# Patient Record
Sex: Female | Born: 1935
Health system: Southern US, Community
[De-identification: ages and names within clinical notes are randomized; demographics above are authoritative.]

## PROBLEM LIST (undated history)

## (undated) DIAGNOSIS — M1990 Unspecified osteoarthritis, unspecified site: Secondary | ICD-10-CM

## (undated) DIAGNOSIS — I441 Atrioventricular block, second degree: Secondary | ICD-10-CM

## (undated) DIAGNOSIS — H9190 Unspecified hearing loss, unspecified ear: Secondary | ICD-10-CM

## (undated) DIAGNOSIS — IMO0002 Reserved for concepts with insufficient information to code with codable children: Secondary | ICD-10-CM

## (undated) DIAGNOSIS — I82409 Acute embolism and thrombosis of unspecified deep veins of unspecified lower extremity: Secondary | ICD-10-CM

## (undated) DIAGNOSIS — H409 Unspecified glaucoma: Secondary | ICD-10-CM

## (undated) DIAGNOSIS — Z973 Presence of spectacles and contact lenses: Secondary | ICD-10-CM

## (undated) DIAGNOSIS — R609 Edema, unspecified: Secondary | ICD-10-CM

## (undated) DIAGNOSIS — K862 Cyst of pancreas: Secondary | ICD-10-CM

## (undated) DIAGNOSIS — R42 Dizziness and giddiness: Secondary | ICD-10-CM

## (undated) DIAGNOSIS — R112 Nausea with vomiting, unspecified: Secondary | ICD-10-CM

## (undated) DIAGNOSIS — I499 Cardiac arrhythmia, unspecified: Secondary | ICD-10-CM

## (undated) DIAGNOSIS — E785 Hyperlipidemia, unspecified: Secondary | ICD-10-CM

## (undated) DIAGNOSIS — F419 Anxiety disorder, unspecified: Secondary | ICD-10-CM

## (undated) DIAGNOSIS — Z9889 Other specified postprocedural states: Secondary | ICD-10-CM

## (undated) DIAGNOSIS — C801 Malignant (primary) neoplasm, unspecified: Secondary | ICD-10-CM

## (undated) DIAGNOSIS — IMO0001 Reserved for inherently not codable concepts without codable children: Secondary | ICD-10-CM

## (undated) DIAGNOSIS — I10 Essential (primary) hypertension: Secondary | ICD-10-CM

## (undated) DIAGNOSIS — H269 Unspecified cataract: Secondary | ICD-10-CM

## (undated) DIAGNOSIS — C50919 Malignant neoplasm of unspecified site of unspecified female breast: Secondary | ICD-10-CM

## (undated) DIAGNOSIS — Z972 Presence of dental prosthetic device (complete) (partial): Secondary | ICD-10-CM

## (undated) DIAGNOSIS — I2699 Other pulmonary embolism without acute cor pulmonale: Secondary | ICD-10-CM

## (undated) DIAGNOSIS — E039 Hypothyroidism, unspecified: Secondary | ICD-10-CM

## (undated) DIAGNOSIS — T7840XA Allergy, unspecified, initial encounter: Secondary | ICD-10-CM

## (undated) DIAGNOSIS — R0600 Dyspnea, unspecified: Secondary | ICD-10-CM

## (undated) HISTORY — DX: Atrioventricular block, second degree: I44.1

## (undated) HISTORY — PX: CATARACT EXTRACTION: SUR2

## (undated) HISTORY — PX: EYE SURGERY: SHX253

## (undated) HISTORY — PX: COLONOSCOPY: SHX174

## (undated) HISTORY — DX: Reserved for concepts with insufficient information to code with codable children: IMO0002

## (undated) HISTORY — PX: TONSILLECTOMY: SUR1361

## (undated) HISTORY — DX: Reserved for inherently not codable concepts without codable children: IMO0001

## (undated) HISTORY — DX: Malignant (primary) neoplasm, unspecified: C80.1

## (undated) HISTORY — DX: Unspecified osteoarthritis, unspecified site: M19.90

---
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
## 1964-05-22 HISTORY — PX: ABDOMINAL HYSTERECTOMY: SHX81

## 1990-05-22 HISTORY — PX: BREAST SURGERY: SHX581

## 1993-05-22 HISTORY — PX: JOINT REPLACEMENT: SHX530

## 1998-07-13 ENCOUNTER — Other Ambulatory Visit: Admission: RE | Admit: 1998-07-13 | Discharge: 1998-07-13 | Payer: Self-pay | Admitting: Internal Medicine

## 1999-05-19 ENCOUNTER — Ambulatory Visit (HOSPITAL_COMMUNITY): Admission: RE | Admit: 1999-05-19 | Discharge: 1999-05-19 | Payer: Self-pay | Admitting: Internal Medicine

## 1999-05-19 ENCOUNTER — Encounter: Payer: Self-pay | Admitting: Internal Medicine

## 1999-06-27 ENCOUNTER — Other Ambulatory Visit: Admission: RE | Admit: 1999-06-27 | Discharge: 1999-06-27 | Payer: Self-pay | Admitting: Internal Medicine

## 2000-07-03 ENCOUNTER — Other Ambulatory Visit: Admission: RE | Admit: 2000-07-03 | Discharge: 2000-07-03 | Payer: Self-pay | Admitting: Internal Medicine

## 2000-07-04 ENCOUNTER — Ambulatory Visit (HOSPITAL_COMMUNITY): Admission: RE | Admit: 2000-07-04 | Discharge: 2000-07-04 | Payer: Self-pay | Admitting: *Deleted

## 2000-07-04 ENCOUNTER — Encounter (INDEPENDENT_AMBULATORY_CARE_PROVIDER_SITE_OTHER): Payer: Self-pay | Admitting: Specialist

## 2001-07-19 ENCOUNTER — Other Ambulatory Visit: Admission: RE | Admit: 2001-07-19 | Discharge: 2001-07-19 | Payer: Self-pay | Admitting: Internal Medicine

## 2002-08-14 ENCOUNTER — Other Ambulatory Visit: Admission: RE | Admit: 2002-08-14 | Discharge: 2002-08-14 | Payer: Self-pay | Admitting: Oncology

## 2002-08-21 ENCOUNTER — Encounter: Payer: Self-pay | Admitting: Internal Medicine

## 2002-08-21 ENCOUNTER — Encounter: Admission: RE | Admit: 2002-08-21 | Discharge: 2002-08-21 | Payer: Self-pay | Admitting: Internal Medicine

## 2004-01-04 ENCOUNTER — Other Ambulatory Visit: Admission: RE | Admit: 2004-01-04 | Discharge: 2004-01-04 | Payer: Self-pay | Admitting: Internal Medicine

## 2006-01-08 ENCOUNTER — Encounter (INDEPENDENT_AMBULATORY_CARE_PROVIDER_SITE_OTHER): Payer: Self-pay | Admitting: Specialist

## 2006-01-08 ENCOUNTER — Ambulatory Visit (HOSPITAL_COMMUNITY): Admission: RE | Admit: 2006-01-08 | Discharge: 2006-01-08 | Payer: Self-pay | Admitting: *Deleted

## 2007-10-18 ENCOUNTER — Other Ambulatory Visit: Admission: RE | Admit: 2007-10-18 | Discharge: 2007-10-18 | Payer: Self-pay | Admitting: Internal Medicine

## 2007-12-05 ENCOUNTER — Encounter: Admission: RE | Admit: 2007-12-05 | Discharge: 2007-12-05 | Payer: Self-pay | Admitting: Internal Medicine

## 2008-01-04 ENCOUNTER — Observation Stay (HOSPITAL_COMMUNITY): Admission: EM | Admit: 2008-01-04 | Discharge: 2008-01-05 | Payer: Self-pay | Admitting: *Deleted

## 2008-01-13 ENCOUNTER — Encounter (INDEPENDENT_AMBULATORY_CARE_PROVIDER_SITE_OTHER): Payer: Self-pay | Admitting: *Deleted

## 2008-01-13 ENCOUNTER — Ambulatory Visit (HOSPITAL_COMMUNITY): Admission: RE | Admit: 2008-01-13 | Discharge: 2008-01-13 | Payer: Self-pay | Admitting: *Deleted

## 2008-01-15 ENCOUNTER — Encounter: Admission: RE | Admit: 2008-01-15 | Discharge: 2008-01-15 | Payer: Self-pay | Admitting: Internal Medicine

## 2008-08-31 ENCOUNTER — Encounter: Admission: RE | Admit: 2008-08-31 | Discharge: 2008-08-31 | Payer: Self-pay | Admitting: Internal Medicine

## 2008-09-16 ENCOUNTER — Encounter: Payer: Self-pay | Admitting: Internal Medicine

## 2009-03-18 ENCOUNTER — Encounter: Admission: RE | Admit: 2009-03-18 | Discharge: 2009-03-18 | Payer: Self-pay | Admitting: Internal Medicine

## 2009-10-12 ENCOUNTER — Encounter: Admission: RE | Admit: 2009-10-12 | Discharge: 2009-10-12 | Payer: Self-pay | Admitting: Geriatric Medicine

## 2010-10-04 NOTE — Op Note (Signed)
Sarah [name], SILLAS NO.:  [id number]   MEDICAL RECORD NO.:  [id number]          PATIENT TYPE:  AMB   LOCATION:  ENDO                         FACILITY:  [facility]   PHYSICIAN:  Georgiana Spinner, M.D.    DATE OF BIRTH:  [birth year]   DATE OF PROCEDURE:  01/13/2008  DATE OF DISCHARGE:                               OPERATIVE REPORT   PROCEDURE:  Colonoscopy.   INDICATIONS:  Rectal bleeding.   ANESTHESIA:  Fentanyl 75 mcg, Versed 7.5 mg.   PROCEDURE:  With the patient mildly sedated in the left lateral  decubitus position, the Pentax videoscopic colonoscope was inserted the  rectum, passed under direct vision into the sigmoid colon and I could go  no further with the scope so it was withdrawn, taking circumferential  views of remaining colonic mucosa, and subsequently a Pentax videoscopic  pediatric colonoscope was inserted into the rectum and passed under  direct vision through a tight tortuous sigmoid colon to reach the cecum  identified by ileocecal valve and appendiceal orifice, both which were  photographed.  In the appendiceal orifice there was what I thought was  probably a retroverted appendix or diverticulum inverted of some sort.  This was photographed and 1 biopsy was taken from the mucosa.  From this  point, the colonoscope was slowly withdrawn, taking circumferential  views of colonic mucosa, stopping only in the rectum which appeared  normal on direct and showed normal on retroflexed view and pulled  through the anal canal.  The endoscope was straightened and withdrawn  through the anal canal which appeared normal.  The patient's vital signs  and pulse oximeter remained stable.  The patient tolerated procedure  well without apparent complication.   FINDINGS:  Diverticulum or inverted appendix in the cecum, otherwise an  entirely normal colonoscopic examination to the cecum.   PLAN:  Have the patient follow up with me for results of biopsy and as   needed.           ______________________________  Georgiana Spinner, M.D.     GMO/MEDQ  D:  01/13/2008  T:  01/13/2008  Job:  [id number]

## 2010-10-04 NOTE — H&P (Signed)
NAMEAVALON, COPPINGER              ACCOUNT NO.:  [id number]   MEDICAL RECORD NO.:  [id number]          PATIENT TYPE:  EMS   LOCATION:  ED                           FACILITY:  [facility]   PHYSICIAN:  Lonia Blood, M.D.      DATE OF BIRTH:  1933/04/03   DATE OF ADMISSION:  01/04/2008  DATE OF DISCHARGE:                              HISTORY & PHYSICAL   PRIMARY CARE PHYSICIAN:  Dr. Pearson Grippe.   PRESENTING COMPLAINT:  Rectal bleed.   HISTORY OF PRESENT ILLNESS:  The patient is a 77 year old female with  history of hypertension and  hypothyroidism who had been doing well  until this morning, around 12:30 a.m., when she suddenly had bright red  blood per rectum while having a bowel movement.  The patient reported  there were clots and it filled the toilet bowl.  She had never had any  bleeding prior to that.  She had, however, noticed some black stools  when she started taking antibiotics about 3 weeks ago.  No dizziness.  No fever.  No nausea, vomiting, diarrhea.  No abdominal pain.  No recent  history of constipation.  No history of hemorrhoids.  The patient was  alarmed and worried and came to the emergency room.  So far there has  been no repeat of her bleed, and she is still stable.  No orthostasis.   PAST MEDICAL HISTORY:  Hypertension, hypothyroidism.   ALLERGIES:  SHE IS ALLERGIC TO CODEINE.   MEDICATIONS:  Synthroid 50 mcg daily, lisinopril 10 mg daily, and Caduet  once daily.   SOCIAL HISTORY:  The patient lives here in [place].  Denies any  tobacco, alcohol or IV drug use.   FAMILY HISTORY:  Significant for hypertension.   REVIEW OF SYSTEMS:  A 12-point review of systems is negative except per  HPI.   PHYSICAL EXAMINATION:  VITAL SIGNS:  Temperature 97.8, blood pressure  160/104 initially, currently 150/92, pulse 73, respiratory rate 16, sats  97% on room air.  GENERAL:  The patient is awake, alert, oriented, in no acute distress.  HEENT:  PERRL.  EOMI.  No pallor, no  jaundice.  NECK:  Supple.  No JVD.  No significant lymphadenopathy.  RESPIRATORY:  She has good air entry bilaterally.  No wheezes, no rales.  CARDIOVASCULAR:  The patient has S1, S2.  No murmur.  ABDOMEN:  Soft, flat, nontender with positive bowel sounds.  EXTREMITIES:  No edema, cyanosis or clubbing.   Labs showed a white count 5.1, hemoglobin 13.7, platelet count 189 with  normal differential.  Urinalysis showed moderate leukocyte esterase and  small hemoglobin, WBCs only 3 to 6 with rare bacteria.  PT/INR 14 and  1.1.  Sodium 141, potassium 3.8, chloride 108, CO2 24, glucose 94, BUN  15, creatinine 0.6, calcium 9.8, total protein 6.9,  and the rest of the  LFTs are normal.   ASSESSMENT:  This is a 77 year old female with one episode of rectal  bleed.  The patient has so far a stable hemoglobin although it has not  equilibrated yet probably.  Also her BUN seems  to be normal, indicating  that this may be very low in the rectum as a source of her bleed.   PLAN:  1. Rectal bleed.  We will admit the patient for observation on a      monitored bed.  We will do serial CBCs to follow her hemoglobin and      see if it drops.  We will also monitor her vitals here.  We will      have 2 wide-bore IVs inserted, type and crossmatch her for 2 units      and keep in case we need it, IV fluids, IV Protonix, and we will      consult GI.  If the patient's bleeding continues, she will need to      have a colonoscopy.  Per patient, she had a colonoscopy 2 years ago      that was normal, but we do not have the report of that.  2. Hypertension.  I will hold her antihypertensives for now until we      are sure the bleeding has stopped.  3. Hypothyroidism.  I will check a TSH and also hold the Synthroid.  I      will put her on clear liquids now, and we will probably resume her      Synthroid within the next 24 hours.   Otherwise, further treatment will depend on how the patient does in the  hospital,  especially regarding her rectal bleeding.      Lonia Blood, M.D.  Electronically Signed     LG/MEDQ  D:  01/04/2008  T:  01/04/2008  Job:  [id number]

## 2010-10-07 NOTE — Op Note (Signed)
Sarah [name], Sarah [name] NO.:  [id number]   MEDICAL RECORD NO.:  [id number]          PATIENT TYPE:  AMB   LOCATION:  ENDO                         FACILITY:  [facility]   PHYSICIAN:  Georgiana Spinner, M.D.    DATE OF BIRTH:  1932-07-04   DATE OF PROCEDURE:  DATE OF DISCHARGE:                                 OPERATIVE REPORT   PROCEDURE:  Colonoscopy.   INDICATIONS:  Colon polyps.   ANESTHESIA:  Fentanyl 100 mcg, Versed 10 mg.   DESCRIPTION OF PROCEDURE:  With the patient mildly sedated in the left  lateral decubitus position, the Olympus videoscopic colonoscope PCF-160 was  inserted in the rectum, passed under direct vision to the cecum, identified  by the ileocecal valve and base of cecum. In the base of the cecum was a  polypoid area which may have been I felt submucosal lipoma less likely  inverted appendix but I decided to biopsy the mucosa to rule out adenomatous  change. From this point, the colonoscope was slowly withdrawn taking  circumferential views of the colonic mucosa stopping in the rectum which  appeared normal on direct and retroflexed view. The endoscope was  straightened and withdrawn.  The patient's vital signs and pulse oximeter  remained stable.  The patient tolerated the procedure well without apparent  complications.   FINDINGS:  Question of polyp in the cecum more likely submucosal lipoma.   PLAN:  Await biopsy report.  The patient will call me for results and follow-  up with me as an outpatient.           ______________________________  Georgiana Spinner, M.D.     GMO/MEDQ  D:  01/08/2006  T:  01/08/2006  Job:  [id number]

## 2010-10-07 NOTE — Procedures (Signed)
[facility]  Patient:    Sarah [name], Sarah [name]                       MRN: [id number] Proc. Date: 07/04/00 Adm. Date:  [id number] Attending:  Sabino Gasser                           Procedure Report  PROCEDURE:  Colonoscopy.  GASTROENTEROLOGIST:  Sabino Gasser, M.D.  ANESTHESIA:  Demerol 80 mg, Versed 9 mg.  PROCEDURE IN DETAIL:  With the patient mildly sedated and in the left lateral decubitus position, the Olympus video pediatric colonoscope was inserted in the rectum and passed through a very tortuous sigmoid colon in which we had difficulty maintaining a lumen but were able to get past this.  Subsequently, we reached the cecum.  The cecum was identified by ileocecal valve and appendiceal area.  There was a questionable polyp here, although it very well may have been inverted appendix.  This was photographed, and one mucosal biopsy was taken only.   Once accomplished, the colonoscope was slowly withdrawn, taking circumferential views of the entire colonic mucosa, stopping only then at the sigmoid colon just below the tortuous area where a small polyp was seen at approximately 20 cm from the anal verge.  It was photographed and was removed using hot biopsy forceps technique.  The colonoscope was withdrawn to the rectum which appeared normal on direct view and showed small polyp on retroflexed view that we eradicated using hot biopsy forceps technique with the setting 3 and 3 blended current.  The endoscope was then straightened and withdrawn.  The patients vital signs and pulse oximetry remained stable.  The patient tolerated the procedure well with no apparent complications.  FINDINGS:  Polyp at 20 cm in the rectum.  Question of inverted appendix versus polyp.  Await biopsy report.  PLAN:  The patient will call me for results and follow up with me as an outpatient. DD:  07/04/00 TD:  07/04/00 Job: [id number]

## 2011-05-23 HISTORY — PX: JOINT REPLACEMENT: SHX530

## 2011-06-09 DIAGNOSIS — Z79899 Other long term (current) drug therapy: Secondary | ICD-10-CM | POA: Diagnosis not present

## 2011-06-09 DIAGNOSIS — I10 Essential (primary) hypertension: Secondary | ICD-10-CM | POA: Diagnosis not present

## 2011-06-09 DIAGNOSIS — Z0000 Encounter for general adult medical examination without abnormal findings: Secondary | ICD-10-CM | POA: Diagnosis not present

## 2011-06-09 DIAGNOSIS — E039 Hypothyroidism, unspecified: Secondary | ICD-10-CM | POA: Diagnosis not present

## 2011-06-09 DIAGNOSIS — E7800 Pure hypercholesterolemia, unspecified: Secondary | ICD-10-CM | POA: Diagnosis not present

## 2011-06-14 DIAGNOSIS — I10 Essential (primary) hypertension: Secondary | ICD-10-CM | POA: Diagnosis not present

## 2011-06-14 DIAGNOSIS — Z23 Encounter for immunization: Secondary | ICD-10-CM | POA: Diagnosis not present

## 2011-06-14 DIAGNOSIS — E039 Hypothyroidism, unspecified: Secondary | ICD-10-CM | POA: Diagnosis not present

## 2011-06-14 DIAGNOSIS — E7800 Pure hypercholesterolemia, unspecified: Secondary | ICD-10-CM | POA: Diagnosis not present

## 2011-06-14 DIAGNOSIS — Z79899 Other long term (current) drug therapy: Secondary | ICD-10-CM | POA: Diagnosis not present

## 2011-06-19 DIAGNOSIS — H8110 Benign paroxysmal vertigo, unspecified ear: Secondary | ICD-10-CM | POA: Diagnosis not present

## 2011-06-19 DIAGNOSIS — R42 Dizziness and giddiness: Secondary | ICD-10-CM | POA: Diagnosis not present

## 2011-07-11 ENCOUNTER — Other Ambulatory Visit: Payer: Self-pay | Admitting: Orthopedic Surgery

## 2011-07-11 ENCOUNTER — Ambulatory Visit
Admission: RE | Admit: 2011-07-11 | Discharge: 2011-07-11 | Disposition: A | Discharge status: Home / Self Care | Payer: Medicare Other | Source: Ambulatory Visit | Attending: Orthopedic Surgery | Admitting: Orthopedic Surgery

## 2011-07-11 DIAGNOSIS — M79669 Pain in unspecified lower leg: Secondary | ICD-10-CM

## 2011-07-11 DIAGNOSIS — M79609 Pain in unspecified limb: Secondary | ICD-10-CM | POA: Diagnosis not present

## 2011-07-11 DIAGNOSIS — R928 Other abnormal and inconclusive findings on diagnostic imaging of breast: Secondary | ICD-10-CM | POA: Diagnosis not present

## 2011-07-11 DIAGNOSIS — M1710 Unilateral primary osteoarthritis, unspecified knee: Secondary | ICD-10-CM | POA: Diagnosis not present

## 2011-07-11 DIAGNOSIS — Z09 Encounter for follow-up examination after completed treatment for conditions other than malignant neoplasm: Secondary | ICD-10-CM | POA: Diagnosis not present

## 2011-07-26 DIAGNOSIS — H8110 Benign paroxysmal vertigo, unspecified ear: Secondary | ICD-10-CM | POA: Diagnosis not present

## 2011-07-31 ENCOUNTER — Ambulatory Visit: Payer: Medicare Other | Attending: Geriatric Medicine | Admitting: Physical Therapy

## 2011-07-31 DIAGNOSIS — IMO0001 Reserved for inherently not codable concepts without codable children: Secondary | ICD-10-CM | POA: Diagnosis not present

## 2011-07-31 DIAGNOSIS — H8110 Benign paroxysmal vertigo, unspecified ear: Secondary | ICD-10-CM | POA: Diagnosis not present

## 2011-07-31 DIAGNOSIS — R269 Unspecified abnormalities of gait and mobility: Secondary | ICD-10-CM | POA: Insufficient documentation

## 2011-08-07 ENCOUNTER — Encounter: Payer: Medicare Other | Admitting: Physical Therapy

## 2011-08-07 ENCOUNTER — Encounter (HOSPITAL_COMMUNITY): Payer: Self-pay | Admitting: Pharmacy Technician

## 2011-08-08 DIAGNOSIS — M1710 Unilateral primary osteoarthritis, unspecified knee: Secondary | ICD-10-CM | POA: Diagnosis not present

## 2011-08-10 ENCOUNTER — Other Ambulatory Visit: Payer: Self-pay

## 2011-08-10 ENCOUNTER — Encounter (HOSPITAL_COMMUNITY): Payer: Self-pay

## 2011-08-10 ENCOUNTER — Encounter (HOSPITAL_COMMUNITY)
Admission: RE | Admit: 2011-08-10 | Discharge: 2011-08-10 | Disposition: A | Discharge status: Home / Self Care | Payer: Medicare Other | Source: Ambulatory Visit | Attending: Orthopedic Surgery | Admitting: Orthopedic Surgery

## 2011-08-10 ENCOUNTER — Encounter (HOSPITAL_COMMUNITY)
Admission: RE | Admit: 2011-08-10 | Discharge: 2011-08-10 | Disposition: A | Discharge status: Home / Self Care | Payer: Medicare Other | Source: Ambulatory Visit | Attending: Surgery | Admitting: Surgery

## 2011-08-10 DIAGNOSIS — I10 Essential (primary) hypertension: Secondary | ICD-10-CM | POA: Diagnosis not present

## 2011-08-10 DIAGNOSIS — M47814 Spondylosis without myelopathy or radiculopathy, thoracic region: Secondary | ICD-10-CM | POA: Diagnosis not present

## 2011-08-10 DIAGNOSIS — E785 Hyperlipidemia, unspecified: Secondary | ICD-10-CM | POA: Diagnosis not present

## 2011-08-10 DIAGNOSIS — M1710 Unilateral primary osteoarthritis, unspecified knee: Secondary | ICD-10-CM | POA: Diagnosis not present

## 2011-08-10 DIAGNOSIS — E039 Hypothyroidism, unspecified: Secondary | ICD-10-CM | POA: Diagnosis not present

## 2011-08-10 DIAGNOSIS — Z01811 Encounter for preprocedural respiratory examination: Secondary | ICD-10-CM | POA: Diagnosis not present

## 2011-08-10 DIAGNOSIS — J984 Other disorders of lung: Secondary | ICD-10-CM | POA: Diagnosis not present

## 2011-08-10 HISTORY — DX: Hypothyroidism, unspecified: E03.9

## 2011-08-10 HISTORY — DX: Essential (primary) hypertension: I10

## 2011-08-10 HISTORY — DX: Nausea with vomiting, unspecified: R11.2

## 2011-08-10 HISTORY — DX: Hyperlipidemia, unspecified: E78.5

## 2011-08-10 HISTORY — DX: Other specified postprocedural states: Z98.890

## 2011-08-10 LAB — URINALYSIS, ROUTINE W REFLEX MICROSCOPIC
Bilirubin Urine: NEGATIVE
Hgb urine dipstick: NEGATIVE
Specific Gravity, Urine: 1.016 (ref 1.005–1.030)
pH: 7 (ref 5.0–8.0)

## 2011-08-10 LAB — CBC
HCT: 45.7 % (ref 36.0–46.0)
Hemoglobin: 15.6 g/dL — ABNORMAL HIGH (ref 12.0–15.0)
MCH: 30.4 pg (ref 26.0–34.0)
MCHC: 34.1 g/dL (ref 30.0–36.0)
MCV: 88.9 fL (ref 78.0–100.0)

## 2011-08-10 LAB — COMPREHENSIVE METABOLIC PANEL
Alkaline Phosphatase: 80 U/L (ref 39–117)
BUN: 15 mg/dL (ref 6–23)
GFR calc Af Amer: >90 mL/min (ref >90)
Glucose, Bld: 80 mg/dL (ref 70–99)
Potassium: 4 mEq/L (ref 3.5–5.1)
Total Protein: 7.3 g/dL (ref 6.0–8.3)

## 2011-08-10 LAB — PROTIME-INR: Prothrombin Time: 13.3 seconds (ref 11.6–15.2)

## 2011-08-10 LAB — APTT: aPTT: 30 seconds (ref 24–37)

## 2011-08-10 LAB — SURGICAL PCR SCREEN
MRSA, PCR: NEGATIVE
Staphylococcus aureus: POSITIVE — AB

## 2011-08-10 LAB — TYPE AND SCREEN: Antibody Screen: NEGATIVE

## 2011-08-10 NOTE — Pre-Procedure Instructions (Addendum)
20 Sarah [name]  08/10/2011   Your procedure is scheduled on:  March 27 [facility])  Report to [facility] at 1020 AM.  Call this number if you have problems the morning of surgery: [phone]   Remember:   Do not eat food:After Midnight.  May have clear liquids: up to 4 Hours before arrival.  Clear liquids include soda, tea, black coffee, apple or grape juice, broth.  Take these medicines the morning of surgery with A SIP OF WATER: NORVASC,SYNTHROID   Do not wear jewelry, make-up or nail polish.  Do not wear lotions, powders, or perfumes. You may wear deodorant.  Do not shave 48 hours prior to surgery.  Do not bring valuables to the hospital.  Contacts, dentures or bridgework may not be worn into surgery.  Leave suitcase in the car. After surgery it may be brought to your room.  For patients admitted to the hospital, checkout time is 11:00 AM the day of discharge.   Patients discharged the day of surgery will not be allowed to drive home.  Name and phone number of your driver: NA  Special Instructions: Incentive Spirometry - Practice and bring it with you on the day of surgery. and CHG Shower Use Special Wash: 1/2 bottle night before surgery and 1/2 bottle morning of surgery.   Please read over the following fact sheets that you were given: Pain Booklet, Blood Transfusion Information, Total Joint Packet, MRSA Information and Surgical Site Infection Prevention

## 2011-08-11 DIAGNOSIS — H3530 Unspecified macular degeneration: Secondary | ICD-10-CM | POA: Diagnosis not present

## 2011-08-11 DIAGNOSIS — H04129 Dry eye syndrome of unspecified lacrimal gland: Secondary | ICD-10-CM | POA: Diagnosis not present

## 2011-08-11 DIAGNOSIS — H40019 Open angle with borderline findings, low risk, unspecified eye: Secondary | ICD-10-CM | POA: Diagnosis not present

## 2011-08-11 DIAGNOSIS — H18519 Endothelial corneal dystrophy, unspecified eye: Secondary | ICD-10-CM | POA: Diagnosis not present

## 2011-08-11 LAB — URINE CULTURE
Colony Count: 15000
Culture  Setup Time: 201303211206

## 2011-08-14 NOTE — H&P (Signed)
  [facility] [address]   [address] [place], [place] [address] [phone] A Division of [facility]  Loreta Ave, M.D.     Robert A. Thurston Hole, M.D.     Lunette Stands, M.D. Eulas Post, M.D.    Buford Dresser, M.D. Estell Harpin, M.D. Ralene Cork, D.O.          Genene Churn. Barry Dienes, PA-C            Kirstin A. Shepperson, PA-C Janace Litten, OPA-C   RE: Sarah, [name]                                [id number]      DOB: 09/08/1932 PROGRESS NOTE: 08-08-11 Chief complaint: Right knee pain.  History of present illness: 78 year-old white female with a history of end stage DJD, right knee, and chronic pain.  States that symptoms are unchanged from previous visit.  She is wanting to proceed with total knee replacement as scheduled.  We received pre-op medical clearance from Dr. Merlene Laughter.  Patient states that she was recently seen by Dr. Pete Glatter for dizziness and diagnosed with vertigo.  She did see a physical therapist for this.  She admits to having some balance issues over the last few years, but has not seen a neurologist.  She has been taking Ambien for about three years and we discussed this possibly contributing to her symptoms.  No complaints of chest pain or shortness of breath. Current medications: Naproxen, Amlodipine, Simvastatin, Ambien and Synthroid. Allergies: Codeine. Past medical/surgical history: Hypercholesterolemia, hypertension, hypothyroidism and sleep disorder. Review of systems: Patient states that she was treated about a month ago for a UTI.  Denies fevers, chills, cardiac, pulmonary, GI or GU issues.   Family history: Positive for hypertension and heart disease.  Social history: Does not smoke, admits occasional wine consumption.  Patient is a widow and currently retired.       EXAMINATION: Height: 5?5.  Weight: 129 pounds.  Respirations: 24.  Blood pressure: 139/84.  Pulse: 76.   Temperature: 98.0.  Pleasant, elderly white female, alert and oriented x 3 and in no acute distress.  Head is normocephalic, a traumatic.  PERRLA, EOMI.  No increase in respiratory effort.  Lungs: CTA bilaterally.  No wheezes noted.  Heart: RRR.  S1 and S2.  No murmurs noted.  Abdomen: Round and non-distended.  NBS x 4.  Soft and non-tender.  Right knee: Range of motion about 5-120 degrees.  Positive crepitus.  Positive effusion.  Joint line tender.  Ligaments stable.  Bilateral calves non-tender.  Neurovascularly intact.  Skin warm and dry.   IMPRESSION: End stage DJD, right knee, and chronic pain.  Failed conservative treatment.    PLAN:  We will proceed with total knee replacement as scheduled.  Advised her to call Dr. Laverle Hobby office to discuss the possibility of Ambien contributing to her ongoing balance and vertigo issues.  Dr. Eulah Pont discussed gradual taper off this medication at some point.  Follow up in the office two weeks post-op.  All questions answered.    Loreta Ave, M.D.   Electronically verified by Loreta Ave, M.D. DFM(JMO):jjh Cc: Dr. Merlene Laughter, fax: [phone] D 08-08-11 T 08-09-11

## 2011-08-15 MED ORDER — CEFAZOLIN SODIUM-DEXTROSE 2-3 GM-% IV SOLR
2.0000 g | INTRAVENOUS | Status: AC
  Administered 2011-08-16: 2 g via INTRAVENOUS
  Filled 2011-08-15: qty 50

## 2011-08-16 ENCOUNTER — Encounter (HOSPITAL_COMMUNITY): Payer: Self-pay | Admitting: Anesthesiology

## 2011-08-16 ENCOUNTER — Encounter (HOSPITAL_COMMUNITY)
Admission: RE | Disposition: A | Discharge status: Home Health | Payer: Self-pay | Source: Ambulatory Visit | Attending: Orthopedic Surgery

## 2011-08-16 ENCOUNTER — Inpatient Hospital Stay (HOSPITAL_COMMUNITY)
Admission: RE | Admit: 2011-08-16 | Discharge: 2011-08-19 | DRG: 470 | Disposition: A | Discharge status: Home Health | Payer: Medicare Other | Source: Ambulatory Visit | Attending: Orthopedic Surgery | Admitting: Orthopedic Surgery

## 2011-08-16 ENCOUNTER — Ambulatory Visit (HOSPITAL_COMMUNITY): Payer: Medicare Other

## 2011-08-16 ENCOUNTER — Ambulatory Visit (HOSPITAL_COMMUNITY): Payer: Medicare Other | Admitting: Anesthesiology

## 2011-08-16 DIAGNOSIS — Z87440 Personal history of urinary (tract) infections: Secondary | ICD-10-CM | POA: Diagnosis not present

## 2011-08-16 DIAGNOSIS — Z886 Allergy status to analgesic agent status: Secondary | ICD-10-CM

## 2011-08-16 DIAGNOSIS — IMO0002 Reserved for concepts with insufficient information to code with codable children: Secondary | ICD-10-CM | POA: Diagnosis not present

## 2011-08-16 DIAGNOSIS — I10 Essential (primary) hypertension: Secondary | ICD-10-CM | POA: Diagnosis not present

## 2011-08-16 DIAGNOSIS — Z01812 Encounter for preprocedural laboratory examination: Secondary | ICD-10-CM | POA: Diagnosis not present

## 2011-08-16 DIAGNOSIS — Z7901 Long term (current) use of anticoagulants: Secondary | ICD-10-CM

## 2011-08-16 DIAGNOSIS — Z471 Aftercare following joint replacement surgery: Secondary | ICD-10-CM

## 2011-08-16 DIAGNOSIS — Z888 Allergy status to other drugs, medicaments and biological substances status: Secondary | ICD-10-CM | POA: Diagnosis not present

## 2011-08-16 DIAGNOSIS — E785 Hyperlipidemia, unspecified: Secondary | ICD-10-CM | POA: Diagnosis not present

## 2011-08-16 DIAGNOSIS — E039 Hypothyroidism, unspecified: Secondary | ICD-10-CM | POA: Diagnosis not present

## 2011-08-16 DIAGNOSIS — M1710 Unilateral primary osteoarthritis, unspecified knee: Secondary | ICD-10-CM | POA: Diagnosis not present

## 2011-08-16 DIAGNOSIS — M25569 Pain in unspecified knee: Secondary | ICD-10-CM | POA: Diagnosis not present

## 2011-08-16 DIAGNOSIS — M1711 Unilateral primary osteoarthritis, right knee: Secondary | ICD-10-CM | POA: Diagnosis present

## 2011-08-16 DIAGNOSIS — Z9889 Other specified postprocedural states: Secondary | ICD-10-CM | POA: Insufficient documentation

## 2011-08-16 DIAGNOSIS — Z96659 Presence of unspecified artificial knee joint: Secondary | ICD-10-CM | POA: Diagnosis not present

## 2011-08-16 DIAGNOSIS — G8918 Other acute postprocedural pain: Secondary | ICD-10-CM | POA: Diagnosis not present

## 2011-08-16 DIAGNOSIS — R112 Nausea with vomiting, unspecified: Secondary | ICD-10-CM | POA: Insufficient documentation

## 2011-08-16 HISTORY — PX: TOTAL KNEE ARTHROPLASTY: SHX125

## 2011-08-16 SURGERY — ARTHROPLASTY, KNEE, TOTAL
Anesthesia: General | Site: Knee | Laterality: Right | Wound class: Clean

## 2011-08-16 MED ORDER — DROPERIDOL 2.5 MG/ML IJ SOLN
INTRAMUSCULAR | Status: DC | PRN
  Administered 2011-08-16: 0.625 mg via INTRAVENOUS

## 2011-08-16 MED ORDER — WARFARIN - PHARMACIST DOSING INPATIENT: Freq: Every day | Status: DC

## 2011-08-16 MED ORDER — METOCLOPRAMIDE HCL 10 MG PO TABS: 5.0000 mg | ORAL_TABLET | Freq: Three times a day (TID) | ORAL | Status: DC | PRN

## 2011-08-16 MED ORDER — SODIUM CHLORIDE 0.9 % IR SOLN
Status: DC | PRN
  Administered 2011-08-16: 3000 mL

## 2011-08-16 MED ORDER — BUPIVACAINE HCL (PF) 0.25 % IJ SOLN
INTRAMUSCULAR | Status: DC | PRN
  Administered 2011-08-16: 30 mL via INTRA_ARTICULAR

## 2011-08-16 MED ORDER — SALINE SPRAY 0.65 % NA SOLN
1.0000 | NASAL | Status: DC | PRN
  Filled 2011-08-16: qty 44

## 2011-08-16 MED ORDER — ONDANSETRON HCL 4 MG/2ML IJ SOLN: 4.0000 mg | Freq: Once | INTRAMUSCULAR | Status: DC | PRN

## 2011-08-16 MED ORDER — LACTATED RINGERS IV SOLN
INTRAVENOUS | Status: DC
  Administered 2011-08-16: 11:00:00 via INTRAVENOUS

## 2011-08-16 MED ORDER — LIDOCAINE HCL (CARDIAC) 20 MG/ML IV SOLN
INTRAVENOUS | Status: DC | PRN
  Administered 2011-08-16: 40 mg via INTRAVENOUS

## 2011-08-16 MED ORDER — LACTATED RINGERS IV SOLN
INTRAVENOUS | Status: DC | PRN
  Administered 2011-08-16 (×2): via INTRAVENOUS

## 2011-08-16 MED ORDER — HYDROMORPHONE HCL PF 1 MG/ML IJ SOLN
0.5000 mg | INTRAMUSCULAR | Status: DC | PRN
  Administered 2011-08-16 – 2011-08-17 (×2): 1 mg via INTRAVENOUS
  Filled 2011-08-16 (×2): qty 1

## 2011-08-16 MED ORDER — METOCLOPRAMIDE HCL 5 MG/ML IJ SOLN: 5.0000 mg | Freq: Three times a day (TID) | INTRAMUSCULAR | Status: DC | PRN

## 2011-08-16 MED ORDER — WARFARIN VIDEO: Freq: Once | Status: DC

## 2011-08-16 MED ORDER — METHOCARBAMOL 100 MG/ML IJ SOLN
500.0000 mg | Freq: Four times a day (QID) | INTRAMUSCULAR | Status: DC | PRN
  Administered 2011-08-16: 500 mg via INTRAVENOUS
  Filled 2011-08-16: qty 5

## 2011-08-16 MED ORDER — ONDANSETRON HCL 4 MG PO TABS: 4.0000 mg | ORAL_TABLET | Freq: Four times a day (QID) | ORAL | Status: DC | PRN

## 2011-08-16 MED ORDER — FLEET ENEMA 7-19 GM/118ML RE ENEM: 1.0000 | ENEMA | Freq: Once | RECTAL | Status: AC | PRN

## 2011-08-16 MED ORDER — EPHEDRINE SULFATE 50 MG/ML IJ SOLN
INTRAMUSCULAR | Status: DC | PRN
  Administered 2011-08-16 (×2): 10 mg via INTRAVENOUS

## 2011-08-16 MED ORDER — LEVOTHYROXINE SODIUM 50 MCG PO TABS
50.0000 ug | ORAL_TABLET | Freq: Every day | ORAL | Status: DC
  Administered 2011-08-17 – 2011-08-19 (×3): 50 ug via ORAL
  Filled 2011-08-16 (×4): qty 1

## 2011-08-16 MED ORDER — ACETAMINOPHEN 650 MG RE SUPP: 650.0000 mg | Freq: Four times a day (QID) | RECTAL | Status: DC | PRN

## 2011-08-16 MED ORDER — FENTANYL CITRATE 0.05 MG/ML IJ SOLN
INTRAMUSCULAR | Status: AC
  Filled 2011-08-16: qty 2

## 2011-08-16 MED ORDER — ONDANSETRON HCL 4 MG/2ML IJ SOLN
INTRAMUSCULAR | Status: DC | PRN
  Administered 2011-08-16 (×2): 4 mg via INTRAVENOUS

## 2011-08-16 MED ORDER — MENTHOL 3 MG MT LOZG: 1.0000 | LOZENGE | OROMUCOSAL | Status: DC | PRN

## 2011-08-16 MED ORDER — CEFAZOLIN SODIUM 1-5 GM-% IV SOLN
1.0000 g | Freq: Three times a day (TID) | INTRAVENOUS | Status: AC
  Administered 2011-08-16 – 2011-08-17 (×3): 1 g via INTRAVENOUS
  Filled 2011-08-16 (×4): qty 50

## 2011-08-16 MED ORDER — PROPOFOL 10 MG/ML IV BOLUS
INTRAVENOUS | Status: DC | PRN
  Administered 2011-08-16: 200 mg via INTRAVENOUS

## 2011-08-16 MED ORDER — SIMVASTATIN 10 MG PO TABS
10.0000 mg | ORAL_TABLET | Freq: Every day | ORAL | Status: DC
  Administered 2011-08-16 – 2011-08-18 (×3): 10 mg via ORAL
  Filled 2011-08-16 (×4): qty 1

## 2011-08-16 MED ORDER — ACETAMINOPHEN 10 MG/ML IV SOLN
INTRAVENOUS | Status: DC | PRN
  Administered 2011-08-16: 1000 mg via INTRAVENOUS

## 2011-08-16 MED ORDER — ONDANSETRON HCL 4 MG/2ML IJ SOLN: 4.0000 mg | Freq: Four times a day (QID) | INTRAMUSCULAR | Status: DC | PRN

## 2011-08-16 MED ORDER — SENNOSIDES-DOCUSATE SODIUM 8.6-50 MG PO TABS: 1.0000 | ORAL_TABLET | Freq: Every evening | ORAL | Status: DC | PRN

## 2011-08-16 MED ORDER — WARFARIN SODIUM 5 MG PO TABS
5.0000 mg | ORAL_TABLET | Freq: Once | ORAL | Status: AC
  Administered 2011-08-16: 5 mg via ORAL
  Filled 2011-08-16 (×2): qty 1

## 2011-08-16 MED ORDER — PHENOL 1.4 % MT LIQD: 1.0000 | OROMUCOSAL | Status: DC | PRN

## 2011-08-16 MED ORDER — POTASSIUM CHLORIDE IN NACL 20-0.9 MEQ/L-% IV SOLN
INTRAVENOUS | Status: DC
  Administered 2011-08-16 (×2): via INTRAVENOUS
  Filled 2011-08-16 (×6): qty 1000

## 2011-08-16 MED ORDER — AMLODIPINE BESYLATE 5 MG PO TABS
5.0000 mg | ORAL_TABLET | Freq: Two times a day (BID) | ORAL | Status: DC
  Administered 2011-08-16 – 2011-08-19 (×6): 5 mg via ORAL
  Filled 2011-08-16 (×7): qty 1

## 2011-08-16 MED ORDER — SODIUM CHLORIDE 0.9 % IR SOLN
Status: DC | PRN
  Administered 2011-08-16: 1000 mL

## 2011-08-16 MED ORDER — MORPHINE SULFATE 4 MG/ML IJ SOLN
INTRAMUSCULAR | Status: DC | PRN
  Administered 2011-08-16: 4 mg via INTRAVENOUS

## 2011-08-16 MED ORDER — ACETAMINOPHEN 325 MG PO TABS: 650.0000 mg | ORAL_TABLET | Freq: Four times a day (QID) | ORAL | Status: DC | PRN

## 2011-08-16 MED ORDER — BUPIVACAINE-EPINEPHRINE PF 0.5-1:200000 % IJ SOLN
INTRAMUSCULAR | Status: DC | PRN
  Administered 2011-08-16: 20 mL

## 2011-08-16 MED ORDER — METHOCARBAMOL 500 MG PO TABS
500.0000 mg | ORAL_TABLET | Freq: Four times a day (QID) | ORAL | Status: DC | PRN
  Administered 2011-08-16 – 2011-08-17 (×2): 500 mg via ORAL
  Filled 2011-08-16 (×3): qty 1

## 2011-08-16 MED ORDER — HYDROMORPHONE HCL PF 1 MG/ML IJ SOLN
0.2500 mg | INTRAMUSCULAR | Status: DC | PRN
  Administered 2011-08-16 (×2): 0.5 mg via INTRAVENOUS

## 2011-08-16 MED ORDER — FENTANYL CITRATE 0.05 MG/ML IJ SOLN
INTRAMUSCULAR | Status: DC | PRN
  Administered 2011-08-16 (×2): 25 ug via INTRAVENOUS

## 2011-08-16 MED ORDER — SCOPOLAMINE 1 MG/3DAYS TD PT72
1.0000 | MEDICATED_PATCH | Freq: Once | TRANSDERMAL | Status: DC
  Administered 2011-08-16: 1.5 mg via TRANSDERMAL

## 2011-08-16 MED ORDER — HYDROCODONE-ACETAMINOPHEN 7.5-325 MG PO TABS
1.0000 | ORAL_TABLET | ORAL | Status: DC | PRN
  Administered 2011-08-17 – 2011-08-18 (×5): 2 via ORAL
  Administered 2011-08-19: 1 via ORAL
  Filled 2011-08-16 (×4): qty 2
  Filled 2011-08-16: qty 1
  Filled 2011-08-16: qty 2

## 2011-08-16 MED ORDER — FENTANYL CITRATE 0.05 MG/ML IJ SOLN
50.0000 ug | INTRAMUSCULAR | Status: DC | PRN
  Administered 2011-08-16: 100 ug via INTRAVENOUS

## 2011-08-16 MED ORDER — PATIENT'S GUIDE TO USING COUMADIN BOOK
Freq: Once | Status: AC
  Administered 2011-08-16: 18:00:00
  Filled 2011-08-16 (×2): qty 1

## 2011-08-16 MED ORDER — DOCUSATE SODIUM 100 MG PO CAPS
100.0000 mg | ORAL_CAPSULE | Freq: Two times a day (BID) | ORAL | Status: DC
  Administered 2011-08-16 – 2011-08-19 (×6): 100 mg via ORAL
  Filled 2011-08-16 (×7): qty 1

## 2011-08-16 MED ORDER — ACETAMINOPHEN 10 MG/ML IV SOLN
INTRAVENOUS | Status: AC
  Filled 2011-08-16: qty 100

## 2011-08-16 MED ORDER — ENOXAPARIN SODIUM 30 MG/0.3ML ~~LOC~~ SOLN
30.0000 mg | Freq: Two times a day (BID) | SUBCUTANEOUS | Status: DC
  Administered 2011-08-17 – 2011-08-19 (×5): 30 mg via SUBCUTANEOUS
  Filled 2011-08-16 (×7): qty 0.3

## 2011-08-16 SURGICAL SUPPLY — 56 items
BANDAGE ESMARK 6X9 LF (GAUZE/BANDAGES/DRESSINGS) ×1 IMPLANT
BLADE SAG 18X100X1.27 (BLADE) ×4 IMPLANT
BNDG CMPR 9X6 STRL LF SNTH (GAUZE/BANDAGES/DRESSINGS) ×1
BNDG ESMARK 6X9 LF (GAUZE/BANDAGES/DRESSINGS) ×2
BOOTCOVER CLEANROOM LRG (PROTECTIVE WEAR) ×4 IMPLANT
BOWL SMART MIX CTS (DISPOSABLE) ×2 IMPLANT
CEMENT BONE SIMPLEX SPEEDSET (Cement) ×4 IMPLANT
CLOTH BEACON ORANGE TIMEOUT ST (SAFETY) ×2 IMPLANT
COVER BACK TABLE 24X17X13 BIG (DRAPES) ×2 IMPLANT
COVER SURGICAL LIGHT HANDLE (MISCELLANEOUS) ×2 IMPLANT
CUFF TOURNIQUET SINGLE 34IN LL (TOURNIQUET CUFF) ×2 IMPLANT
DRAPE EXTREMITY T 121X128X90 (DRAPE) ×2 IMPLANT
DRAPE PROXIMA HALF (DRAPES) ×2 IMPLANT
DRAPE U-SHAPE 47X51 STRL (DRAPES) ×2 IMPLANT
DRSG PAD ABDOMINAL 8X10 ST (GAUZE/BANDAGES/DRESSINGS) ×2 IMPLANT
DURAPREP 26ML APPLICATOR (WOUND CARE) ×2 IMPLANT
ELECT CAUTERY BLADE 6.4 (BLADE) ×2 IMPLANT
ELECT REM PT RETURN 9FT ADLT (ELECTROSURGICAL) ×2
ELECTRODE REM PT RTRN 9FT ADLT (ELECTROSURGICAL) ×1 IMPLANT
EVACUATOR 1/8 PVC DRAIN (DRAIN) ×4 IMPLANT
FACESHIELD LNG OPTICON STERILE (SAFETY) ×4 IMPLANT
GAUZE XEROFORM 5X9 LF (GAUZE/BANDAGES/DRESSINGS) ×2 IMPLANT
GLOVE BIOGEL PI IND STRL 8 (GLOVE) ×2 IMPLANT
GLOVE BIOGEL PI INDICATOR 8 (GLOVE) ×2
GLOVE ORTHO TXT STRL SZ7.5 (GLOVE) ×4 IMPLANT
GOWN PREVENTION PLUS XLARGE (GOWN DISPOSABLE) ×2 IMPLANT
GOWN STRL NON-REIN LRG LVL3 (GOWN DISPOSABLE) ×2 IMPLANT
GOWN STRL REIN 2XL XLG LVL4 (GOWN DISPOSABLE) ×4 IMPLANT
HANDPIECE INTERPULSE COAX TIP (DISPOSABLE) ×2
IMMOBILIZER KNEE 22 UNIV (SOFTGOODS) ×2 IMPLANT
IMMOBILIZER KNEE 24 THIGH 36 (MISCELLANEOUS) IMPLANT
IMMOBILIZER KNEE 24 UNIV (MISCELLANEOUS)
KIT BASIN OR (CUSTOM PROCEDURE TRAY) ×2 IMPLANT
KIT ROOM TURNOVER OR (KITS) ×2 IMPLANT
MANIFOLD NEPTUNE II (INSTRUMENTS) IMPLANT
NS IRRIG 1000ML POUR BTL (IV SOLUTION) ×2 IMPLANT
PACK TOTAL JOINT (CUSTOM PROCEDURE TRAY) ×2 IMPLANT
PAD ARMBOARD 7.5X6 YLW CONV (MISCELLANEOUS) ×4 IMPLANT
PAD CAST 4YDX4 CTTN HI CHSV (CAST SUPPLIES) ×1 IMPLANT
PADDING CAST COTTON 4X4 STRL (CAST SUPPLIES) ×2
PADDING CAST COTTON 6X4 STRL (CAST SUPPLIES) ×2 IMPLANT
RUBBERBAND STERILE (MISCELLANEOUS) ×2 IMPLANT
SET HNDPC FAN SPRY TIP SCT (DISPOSABLE) ×1 IMPLANT
SPONGE GAUZE 4X4 12PLY (GAUZE/BANDAGES/DRESSINGS) ×2 IMPLANT
STAPLER VISISTAT 35W (STAPLE) ×2 IMPLANT
SUCTION FRAZIER TIP 10 FR DISP (SUCTIONS) IMPLANT
SUT VIC AB 1 CTX 36 (SUTURE) ×4
SUT VIC AB 1 CTX36XBRD ANBCTR (SUTURE) ×2 IMPLANT
SUT VIC AB 2-0 CT1 27 (SUTURE) ×4
SUT VIC AB 2-0 CT1 TAPERPNT 27 (SUTURE) ×2 IMPLANT
SYR 30ML LL (SYRINGE) ×2 IMPLANT
SYR 30ML SLIP (SYRINGE) ×2 IMPLANT
TOWEL OR 17X24 6PK STRL BLUE (TOWEL DISPOSABLE) ×2 IMPLANT
TOWEL OR 17X26 10 PK STRL BLUE (TOWEL DISPOSABLE) ×2 IMPLANT
TRAY FOLEY CATH 14FR (SET/KITS/TRAYS/PACK) ×2 IMPLANT
WATER STERILE IRR 1000ML POUR (IV SOLUTION) ×4 IMPLANT

## 2011-08-16 NOTE — Interval H&P Note (Signed)
History and Physical Interval Note:  08/16/2011 8:21 AM  Sarah [name]  has presented today for surgery, with the diagnosis of DJD RT KNEE  The various methods of treatment have been discussed with the patient and family. After consideration of risks, benefits and other options for treatment, the patient has consented to  Procedure(s) (LRB): TOTAL KNEE ARTHROPLASTY (Right) as a surgical intervention .  The patients' history has been reviewed, patient examined, no change in status, stable for surgery.  I have reviewed the patients' chart and labs.  Questions were answered to the patient's satisfaction.     MURPHY,DANIEL F

## 2011-08-16 NOTE — Progress Notes (Signed)
Orthopedic Tech Progress Note Patient Details:  Sarah [name] November 09, 1932 [id number]  Patient ID: [name], female   DOB: June 14, 1932, 78 y.o.   MRN: [id number] Viewed order from rn order list  Nikki Dom 08/16/2011, 2:58 PM

## 2011-08-16 NOTE — Progress Notes (Signed)
ANTICOAGULATION CONSULT NOTE - Initial Consult  Pharmacy Consult for Coumadin Indication: VTE prophylaxis  Allergies  Allergen Reactions  . Codeine Other (See Comments)    Unknown reaction  . Thiazide-Type Diuretics Other (See Comments)    Blurry vision    Vital Signs: Temp: 97 F (36.1 C) (03/27 1450) Temp src: Oral (03/27 0859) BP: 106/62 mmHg (03/27 1450) Pulse Rate: 86  (03/27 1450)  Labs: No results found for this basename: HGB:2,HCT:3,PLT:3,APTT:3,LABPROT:3,INR:3,HEPARINUNFRC:3,CREATININE:3,CKTOTAL:3,CKMB:3,TROPONINI:3 in the last 72 hours CrCl is unknown because there is no height on file for the current visit.  Medical History: Past Medical History  Diagnosis Date  . PONV (postoperative nausea and vomiting)   . Hypertension   . Hypothyroidism   . Hyperlipemia     Medications:  Scheduled:    . amLODipine  5 mg Oral BID  .  ceFAZolin (ANCEF) IV  1 g Intravenous Q8H  .  ceFAZolin (ANCEF) IV  2 g Intravenous 60 min Pre-Op  . docusate sodium  100 mg Oral BID  . enoxaparin  30 mg Subcutaneous Q12H  . levothyroxine  50 mcg Oral QAC breakfast  . simvastatin  10 mg Oral QHS  . DISCONTD: scopolamine  1 patch Transdermal Once    Assessment: 5 YOF s/p right total knee arthroplasty to start coumadin for VTE prophylaxis. Baseline INR 0.99 on 3/21, no anticoagulation PTA. Currently also on lovenox 30mg  sq q12hrs.  Goal of Therapy:  INR 2-3   Plan:  - Coumadin 5mg  po x 1 - f/u daily INR - coumadin education book and video - d/c lovenox when INR > 2 Riki Rusk 08/16/2011,4:35 PM

## 2011-08-16 NOTE — Transfer of Care (Signed)
Immediate Anesthesia Transfer of Care Note  Patient: Sarah [name]  Procedure(s) Performed: Procedure(s) (LRB): TOTAL KNEE ARTHROPLASTY (Right)  Patient Location: PACU  Anesthesia Type: General  Level of Consciousness: awake, alert , oriented and patient cooperative  Airway & Oxygen Therapy: Patient Spontanous Breathing and Patient connected to nasal cannula oxygen  Post-op Assessment: Report given to PACU RN, Post -op Vital signs reviewed and stable and Patient moving all extremities  Post vital signs: Reviewed and stable  Complications: No apparent anesthesia complications

## 2011-08-16 NOTE — Anesthesia Procedure Notes (Addendum)
Anesthesia Regional Block:  Femoral nerve block  Pre-Anesthetic Checklist: ,, timeout performed, Correct Patient, Correct Site, Correct Laterality, Correct Procedure, Correct Position, site marked, Risks and benefits discussed,  Surgical consent,  Pre-op evaluation,  At surgeon's request and post-op pain management  Laterality: Right  Prep: Maximum Sterile Barrier Precautions used and chloraprep       Needles:  Injection technique: Single-shot  Needle Type: Stimulator Needle - 80          Additional Needles:  Procedures: nerve stimulator Femoral nerve block  Nerve Stimulator or Paresthesia:  Response: 0.5 mA, 0.1 ms, 4 cm  Additional Responses:   Narrative:  Start time: 08/16/2011 10:45 AM End time: 08/16/2011 10:50 AM Injection made incrementally with aspirations every 5 mL.  Performed by: Personally  Anesthesiologist: Maren Beach MD  Additional Notes: 20cc 0.5% Marcaine w/ epi w/o difficulty or discomfort  GES   Procedure Name: LMA Insertion Date/Time: 08/16/2011 11:26 AM Performed by: Jerilee Hoh Pre-anesthesia Checklist: Patient identified, Emergency Drugs available, Suction available and Patient being monitored Patient Re-evaluated:Patient Re-evaluated prior to inductionOxygen Delivery Method: Circle system utilized Preoxygenation: Pre-oxygenation with 100% oxygen Intubation Type: IV induction Ventilation: Mask ventilation without difficulty LMA: LMA inserted LMA Size: 4.0 Number of attempts: 1 Placement Confirmation: positive ETCO2 and breath sounds checked- equal and bilateral Tube secured with: Tape Dental Injury: Teeth and Oropharynx as per pre-operative assessment

## 2011-08-16 NOTE — Preoperative (Signed)
Beta Blockers   Reason not to administer Beta Blockers:Not Applicable 

## 2011-08-16 NOTE — Brief Op Note (Signed)
08/16/2011  1:23 PM  PATIENT:  Sarah [name]  78 y.o. female  PRE-OPERATIVE DIAGNOSIS:  DJD RT KNEE  POST-OPERATIVE DIAGNOSIS:  DJD RT KNEE  PROCEDURE:  Procedure(s) (LRB): TOTAL KNEE ARTHROPLASTY (Right)  SURGEON:  Surgeon(s) and Role:    * Loreta Ave, MD - Primary  PHYSICIAN ASSISTANT: Zonia Kief M   ANESTHESIA:   general  EBL:  Total I/O In: 1450 [I.V.:1450] Out: 225 [Urine:150; Blood:75]  SPECIMEN:  No Specimen  DISPOSITION OF SPECIMEN:  N/A  COUNTS:  YES  TOURNIQUET:   Total Tourniquet Time Documented: Thigh (Right) - 72 minutes  PATIENT DISPOSITION:  PACU - hemodynamically stable.

## 2011-08-16 NOTE — Anesthesia Preprocedure Evaluation (Signed)
Anesthesia Evaluation  Patient identified by MRN, date of birth, ID band Patient awake    Reviewed: Allergy & Precautions, H&P , NPO status , Patient's Chart, lab work & pertinent test results  Airway Mallampati: I TM Distance: >3 FB Neck ROM: full    Dental  (+) Teeth Intact   Pulmonary          Cardiovascular hypertension, Rhythm:regular Rate:Normal     Neuro/Psych    GI/Hepatic   Endo/Other    Renal/GU      Musculoskeletal   Abdominal   Peds  Hematology   Anesthesia Other Findings   Reproductive/Obstetrics                           Anesthesia Physical Anesthesia Plan  ASA: II  Anesthesia Plan: General LMA and General ETT   Post-op Pain Management: MAC Combined w/ Regional for Post-op pain   Induction: Intravenous  Airway Management Planned: LMA and Oral ETT  Additional Equipment:   Intra-op Plan:   Post-operative Plan: Extubation in OR  Informed Consent: I have reviewed the patients History and Physical, chart, labs and discussed the procedure including the risks, benefits and alternatives for the proposed anesthesia with the patient or authorized representative who has indicated his/her understanding and acceptance.     Plan Discussed with: Anesthesiologist, CRNA and Surgeon  Anesthesia Plan Comments:         Anesthesia Quick Evaluation

## 2011-08-16 NOTE — Anesthesia Postprocedure Evaluation (Signed)
  Anesthesia Post-op Note  Patient: Sarah [name]  Procedure(s) Performed: Procedure(s) (LRB): TOTAL KNEE ARTHROPLASTY (Right)  Patient Location: PACU  Anesthesia Type: General  Level of Consciousness: awake, sedated and patient cooperative  Airway and Oxygen Therapy: Patient Spontanous Breathing and Patient connected to nasal cannula oxygen  Post-op Pain: mild  Post-op Assessment: Post-op Vital signs reviewed, Patient's Cardiovascular Status Stable, Respiratory Function Stable, Patent Airway, No signs of Nausea or vomiting and Pain level controlled  Post-op Vital Signs: stable  Complications: No apparent anesthesia complications

## 2011-08-16 NOTE — Progress Notes (Signed)
Orthopedic Tech Progress Note Patient Details:  Sarah [name] 12/14/1932 [id number]  CPM Right Knee CPM Right Knee: On Right Knee Flexion (Degrees): 60  Right Knee Extension (Degrees): 0  Additional Comments: trapeze bar patient helper   Nikki Dom 08/16/2011, 2:57 PM

## 2011-08-17 ENCOUNTER — Encounter (HOSPITAL_COMMUNITY): Payer: Self-pay | Admitting: Orthopedic Surgery

## 2011-08-17 LAB — CBC
HCT: 35.9 % — ABNORMAL LOW (ref 36.0–46.0)
MCH: 30.4 pg (ref 26.0–34.0)
MCV: 90.9 fL (ref 78.0–100.0)
RBC: 3.95 MIL/uL (ref 3.87–5.11)
RDW: 12.9 % (ref 11.5–15.5)
WBC: 6.1 10*3/uL (ref 4.0–10.5)

## 2011-08-17 LAB — BASIC METABOLIC PANEL
BUN: 13 mg/dL (ref 6–23)
CO2: 25 mEq/L (ref 19–32)
Chloride: 105 mEq/L (ref 96–112)
Creatinine, Ser: 0.55 mg/dL (ref 0.50–1.10)

## 2011-08-17 MED ORDER — WARFARIN SODIUM 5 MG PO TABS
5.0000 mg | ORAL_TABLET | Freq: Once | ORAL | Status: AC
  Administered 2011-08-17: 5 mg via ORAL
  Filled 2011-08-17: qty 1

## 2011-08-17 NOTE — Progress Notes (Signed)
Physical Therapy Evaluation Patient Details Name: Sarah [name] MRN: [id number] DOB: 09/23/1932 Today's Date: 08/17/2011  Problem List: There is no problem list on file for this patient.   Past Medical History:  Past Medical History  Diagnosis Date  . PONV (postoperative nausea and vomiting)   . Hypertension   . Hypothyroidism   . Hyperlipemia    Past Surgical History:  Past Surgical History  Procedure Date  . Abdominal hysterectomy   . Tonsillectomy   . Breast surgery     lumpectomy  . Eye surgery     bilateral cataract removal    PT Assessment/Plan/Recommendation PT Assessment Clinical Impression Statement: Pt is a 78 y/o female admitted s/p right TKA along with the below PT problem list.  Pt would benefit from acute PT to maximize independence and facilitate d/c home with HHPT. PT Recommendation/Assessment: Patient will need skilled PT in the acute care venue PT Problem List: Decreased strength;Decreased range of motion;Decreased activity tolerance;Decreased balance;Decreased mobility;Decreased knowledge of use of DME;Decreased knowledge of precautions;Pain Barriers to Discharge: None PT Therapy Diagnosis : Difficulty walking;Acute pain PT Plan PT Frequency: 7X/week PT Treatment/Interventions: DME instruction;Gait training;Stair training;Functional mobility training;Therapeutic activities;Therapeutic exercise;Balance training;Patient/family education PT Recommendation Follow Up Recommendations: Home health PT Equipment Recommended: None recommended by PT PT Goals  Acute Rehab PT Goals PT Goal Formulation: With patient Time For Goal Achievement: 7 days Pt will go Supine/Side to Sit: with modified independence PT Goal: Supine/Side to Sit - Progress: Goal set today Pt will go Sit to Supine/Side: with modified independence PT Goal: Sit to Supine/Side - Progress: Goal set today Pt will go Sit to Stand: with modified independence PT Goal: Sit to Stand - Progress: Goal  set today Pt will go Stand to Sit: with modified independence PT Goal: Stand to Sit - Progress: Goal set today Pt will Ambulate: >150 feet;with modified independence;with rolling walker PT Goal: Ambulate - Progress: Goal set today Pt will Go Up / Down Stairs: Flight;with min assist;with least restrictive assistive device PT Goal: Up/Down Stairs - Progress: Goal set today Pt will Perform Home Exercise Program: Independently PT Goal: Perform Home Exercise Program - Progress: Goal set today  PT Evaluation Precautions/Restrictions  Precautions Precautions: Knee Precaution Booklet Issued: No Required Braces or Orthoses: Yes Knee Immobilizer: On at all times Restrictions Weight Bearing Restrictions: Yes RLE Weight Bearing: Weight bearing as tolerated Prior Functioning  Home Living Lives With: Alone Receives Help From:  (Daughter is going to come stay, but currently has Flu.) Type of Home: House Home Layout: Two level;1/2 bath on main level Alternate Level Stairs-Rails: Right Alternate Level Stairs-Number of Steps: 14 Home Access: Stairs to enter Entrance Stairs-Rails: Right;Left;Can reach both Entrance Stairs-Number of Steps: 2 Bathroom Shower/Tub: Tub/shower unit;Door Bathroom Toilet: Handicapped height Bathroom Accessibility: Yes Home Adaptive Equipment: Walker - rolling;Straight cane Prior Function Level of Independence: Independent with basic ADLs;Independent with homemaking with ambulation;Independent with gait;Independent with transfers Able to Take Stairs?: Reciprically Driving: Yes Vocation: Retired Financial risk analyst Arousal/Alertness: Awake/alert Overall Cognitive Status: Appears within functional limits for tasks assessed Orientation Level: Oriented X4 Sensation/Coordination Sensation Light Touch: Appears Intact Stereognosis: Not tested Hot/Cold: Not tested Proprioception: Not tested Coordination Gross Motor Movements are Fluid and Coordinated: Yes Fine  Motor Movements are Fluid and Coordinated: Yes Extremity Assessment RUE Assessment RUE Assessment: Within Functional Limits LUE Assessment LUE Assessment: Within Functional Limits RLE Assessment RLE Assessment: Exceptions to [facility] RLE AROM (degrees) Right Knee Extension 0-130: 0  Right Knee Flexion 0-140: 15  RLE  Strength RLE Overall Strength: Deficits;Due to pain RLE Overall Strength Comments: 2/5 LLE Assessment LLE Assessment: Within Functional Limits Pain 8/10 in right knee.  Pt premedicated and repositioned. Mobility (including Balance) Bed Mobility Bed Mobility: Yes Supine to Sit: 3: Mod assist;HOB flat Supine to Sit Details (indicate cue type and reason): Assist for right LE due to pain/weakness and trunk.  Cues for sequence. Transfers Transfers: Yes Sit to Stand: 4: Min assist;With upper extremity assist;From bed Sit to Stand Details (indicate cue type and reason): Assist to balance and off weight right LE due to pain.  Cues for safest hand placement on bed. Stand to Sit: 4: Min assist;With upper extremity assist;To chair/3-in-1 Stand to Sit Details: Assist to slow descent with cues for hand/right LE placement. Ambulation/Gait Ambulation/Gait: Yes Ambulation/Gait Assistance: 4: Min assist Ambulation/Gait Assistance Details (indicate cue type and reason): Assist for balance as well as to off weight right LE during stance due to pain.  Cues for sequence. Ambulation Distance (Feet): 15 Feet Assistive device: Rolling walker Gait Pattern: Step-to pattern;Decreased step length - right;Decreased stance time - right Stairs: No Wheelchair Mobility Wheelchair Mobility: No  Posture/Postural Control Posture/Postural Control: No significant limitations Balance Balance Assessed: No Exercise  Total Joint Exercises Ankle Circles/Pumps: AROM;Right;10 reps;Supine Quad Sets: AROM;Right;10 reps;Supine Heel Slides: AAROM;Right;10 reps;Supine End of Session PT - End of  Session Equipment Utilized During Treatment: Gait belt;Right knee immobilizer Activity Tolerance: Patient tolerated treatment well;Patient limited by pain Patient left: in chair;with call bell in reach Nurse Communication: Mobility status for transfers;Mobility status for ambulation General Behavior During Session: [facility] for tasks performed Cognition: [facility] for tasks performed  Cephus Shelling 08/17/2011, 12:03 PM  08/17/2011 Cephus Shelling, PT, DPT [phone]

## 2011-08-17 NOTE — Progress Notes (Signed)
Physical Therapy Treatment Patient Details Name: KACI DILLIE MRN: [id number] DOB: 06/19/1932 Today's Date: 08/17/2011  PT Assessment/Plan  PT - Assessment/Plan Comments on Treatment Session: Pt admitted s/p right TKA and is progressing well.  Pt able to tolerate therapeutic exercises this pm, but declined mobility due to pain. PT Plan: Discharge plan remains appropriate;Frequency remains appropriate PT Frequency: 7X/week Follow Up Recommendations: Home health PT Equipment Recommended: None recommended by PT PT Goals  Acute Rehab PT Goals PT Goal Formulation: With patient Time For Goal Achievement: 7 days PT Goal: Perform Home Exercise Program - Progress: Progressing toward goal  PT Treatment Precautions/Restrictions  Precautions Precautions: Knee Precaution Booklet Issued: No Required Braces or Orthoses: Yes Knee Immobilizer: On at all times Restrictions Weight Bearing Restrictions: Yes RLE Weight Bearing: Weight bearing as tolerated Pain 5/10 in right knee.  Pt repositioned. Mobility (including Balance) Bed Mobility Bed Mobility: No Transfers Transfers: Yes Sit to Stand: 4: Min assist;With upper extremity assist;From chair/3-in-1 (2 trials.) Sit to Stand Details (indicate cue type and reason): Assist for balance with cues for hand placement. Stand to Sit: 4: Min assist;With upper extremity assist;To chair/3-in-1 (2 trials.) Stand to Sit Details: Assist to control descent with cues for sequence. Stand Pivot Transfers: 4: Min assist Stand Pivot Transfer Details (indicate cue type and reason): Assist for balance with cues for sequence. Ambulation/Gait Ambulation/Gait: No Stairs: No Wheelchair Mobility Wheelchair Mobility: No  Posture/Postural Control Posture/Postural Control: No significant limitations Balance Balance Assessed: No Exercise  Total Joint Exercises Ankle Circles/Pumps: AROM;Right;10 reps;Supine Quad Sets: AROM;Right;10 reps;Supine Heel Slides:  AAROM;Right;10 reps;Supine Hip ABduction/ADduction: AAROM;Right;10 reps;Supine Straight Leg Raises: AAROM;Right;10 reps;Supine End of Session PT - End of Session Activity Tolerance: Patient tolerated treatment well;Patient limited by pain Patient left: in chair;with call bell in reach;with family/visitor present Nurse Communication: Mobility status for transfers;Mobility status for ambulation General Behavior During Session: [facility] for tasks performed Cognition: [facility] for tasks performed  Cephus Shelling 08/17/2011, 2:19 PM  08/17/2011 Cephus Shelling, PT, DPT [phone]

## 2011-08-17 NOTE — Progress Notes (Signed)
Orthopedic Tech Progress Note Patient Details:  Sarah [name] [birth year] [id number]  Other Ortho Devices Type of Ortho Device: Knee Immobilizer   Cammer, Mickie Bail 08/17/2011, 2:22 PM

## 2011-08-17 NOTE — Op Note (Signed)
Sarah [name], Sarah [name] NO.:  [id number]  MEDICAL RECORD NO.:  [id number]  LOCATION:  5010                         FACILITY:  [facility]  PHYSICIAN:  Loreta Ave, M.D. DATE OF BIRTH:  04-21-1933  DATE OF PROCEDURE:  08/16/2011 DATE OF DISCHARGE:                              OPERATIVE REPORT   PREOPERATIVE DIAGNOSES:  Right knee end-stage degenerative arthritis, valgus alignment, some bone loss lateral femoral condyle.  POSTOPERATIVE DIAGNOSES:  Right knee end-stage degenerative arthritis, valgus alignment, some bone loss lateral femoral condyle.  PROCEDURE:  Right knee modified minimally invasive total knee replacement.  Soft tissue balancing.  Cemented pegged posterior stabilized #3 femoral component.  Cemented #3 tibial component, 9 mm polyethylene insert.  Cemented resurfacing 32 mm patellar component.  SURGEON:  Loreta Ave, MD  ASSISTANT:  Genene Churn. Barry Dienes, [place], present throughout the entire case, necessary for timely completion of procedure.  ANESTHESIA:  General.  ESTIMATED BLOOD LOSS:  Minimal.  SPECIMENS:  None.  COUNTS:  None.  COMPLICATIONS:  None.  DRESSINGS:  Soft compressive.  DRAINS:  Hemovac x1.  TOURNIQUET TIME:  One hour.  PROCEDURE IN DETAIL:  The patient was brought to the operating room, placed on the operating room table in supine position.  After adequate anesthesia had been obtained, tourniquet applied, prepped and draped in usual sterile fashion.  Exsanguinated with elevation and Esmarch. Tourniquet inflated to 350 mmHg.  Straight incision above the patella down to tibial tubercle.  Medial arthrotomy, vastus splitting, preserving quad tendon.  Knee exposed.  Some bone loss to lateral femoral condyle not extreme.  Remnants of menisci, cruciate ligaments excised.  Distal femur exposed.  Intramedullary guide placed. Eventually a 10 mm distal resection.  Using epicondylar axis, the femur was sized, cut, and fitted for a  pegged #3 femoral component.  Proximal tibial resection 3 degree posterior slope cut.  Sized for #3 component as well.  Patella exposed.  Posterior 10 mm removed.  Drilled, sized, and fitted for a pegged 32 mm patellar component.  Trials put in place, #3 components above and below, 9 mm insert and 32 patella.  With this construct, I was very pleased with flexion and extension, balancing in flexion and extension and biomechanical axis as well as patellar tracking.  Tibia was marked for rotation and hand reamed.  All trials removed.  Copious irrigation with a pulse irrigating device.  Cement prepared, placed on all components, firmly seated.  Polyethylene attached to tibia, knee reduced.  Patella with a clamp.  Once the cement hardened, knee was reexamined.  Very pleased with mechanical axis, balancing, tracking.  Hemovac was placed through a separate stab wound. Wound irrigated.  Arthrotomy closed with #1 Vicryl.  Skin and subcutaneous tissue with Vicryl staples.  Sterile compressive dressing applied.  Tourniquet deflated and removed.  Knee immobilizer applied. Anesthesia reversed.  Brought to recovery room.  Tolerated the surgery well.  No complications.     Loreta Ave, M.D.     DFM/MEDQ  D:  08/16/2011  T:  08/17/2011  Job:  [id number]

## 2011-08-17 NOTE — Progress Notes (Signed)
Subjective: Doing well.  Pain controlled.     Objective: Vital signs in last 24 hours: Temp:  [97 F (36.1 C)-99.1 F (37.3 C)] 99.1 F (37.3 C) (03/28 0519) Pulse Rate:  [69-86] 81  (03/28 0519) Resp:  [10-18] 18  (03/28 0519) BP: (101-116)/(57-82) 116/57 mmHg (03/28 0519) SpO2:  [96 %-100 %] 98 % (03/28 0519)  Intake/Output from previous day: 03/27 0701 - 03/28 0700 In: 2818.8 [I.V.:2518.8; IV Piggyback:100] Out: 1595 [Urine:1150; Drains:370; Blood:75] Intake/Output this shift: Total I/O In: 150 [P.O.:150] Out: -    Basename 08/17/11 0505  HGB 12.0    Basename 08/17/11 0505  WBC 6.1  RBC 3.95  HCT 35.9*  PLT 153    Basename 08/17/11 0505  NA 138  K 3.6  CL 105  CO2 25  BUN 13  CREATININE 0.55  GLUCOSE 109*  CALCIUM 8.5    Basename 08/17/11 0505  LABPT --  INR 1.20    Exam:  Dressing c/d/i.  Calf nontender, nvi.    Assessment/Plan: D/c dilaudid.  Anticipate d/c home fri or sat if does well with therapy.     Corisa Montini M 08/17/2011, 11:51 AM

## 2011-08-17 NOTE — Progress Notes (Signed)
UR COMPLETED  

## 2011-08-18 DIAGNOSIS — R112 Nausea with vomiting, unspecified: Secondary | ICD-10-CM | POA: Insufficient documentation

## 2011-08-18 DIAGNOSIS — E785 Hyperlipidemia, unspecified: Secondary | ICD-10-CM | POA: Insufficient documentation

## 2011-08-18 DIAGNOSIS — I10 Essential (primary) hypertension: Secondary | ICD-10-CM | POA: Insufficient documentation

## 2011-08-18 DIAGNOSIS — Z9889 Other specified postprocedural states: Secondary | ICD-10-CM | POA: Insufficient documentation

## 2011-08-18 DIAGNOSIS — E039 Hypothyroidism, unspecified: Secondary | ICD-10-CM | POA: Insufficient documentation

## 2011-08-18 DIAGNOSIS — M1711 Unilateral primary osteoarthritis, right knee: Secondary | ICD-10-CM | POA: Diagnosis present

## 2011-08-18 LAB — CBC
HCT: 35 % — ABNORMAL LOW (ref 36.0–46.0)
MCHC: 32.9 g/dL (ref 30.0–36.0)
MCV: 90.4 fL (ref 78.0–100.0)
Platelets: 139 10*3/uL — ABNORMAL LOW (ref 150–400)
RDW: 13 % (ref 11.5–15.5)
WBC: 6.9 10*3/uL (ref 4.0–10.5)

## 2011-08-18 LAB — BASIC METABOLIC PANEL
BUN: 11 mg/dL (ref 6–23)
Chloride: 109 mEq/L (ref 96–112)
Creatinine, Ser: 0.49 mg/dL — ABNORMAL LOW (ref 0.50–1.10)
GFR calc Af Amer: >90 mL/min (ref >90)
GFR calc non Af Amer: >90 mL/min (ref >90)

## 2011-08-18 LAB — PROTIME-INR: INR: 1.5 — ABNORMAL HIGH (ref 0.00–1.49)

## 2011-08-18 MED ORDER — DSS 100 MG PO CAPS: 100.0000 mg | ORAL_CAPSULE | Freq: Two times a day (BID) | ORAL | Status: AC

## 2011-08-18 MED ORDER — WARFARIN SODIUM 5 MG PO TABS
5.0000 mg | ORAL_TABLET | Freq: Once | ORAL | Status: AC
  Administered 2011-08-18: 5 mg via ORAL
  Filled 2011-08-18: qty 1

## 2011-08-18 MED ORDER — ENOXAPARIN SODIUM 30 MG/0.3ML ~~LOC~~ SOLN: 30.0000 mg | Freq: Two times a day (BID) | SUBCUTANEOUS | Status: DC

## 2011-08-18 MED ORDER — SENNOSIDES-DOCUSATE SODIUM 8.6-50 MG PO TABS: 1.0000 | ORAL_TABLET | Freq: Every evening | ORAL | Status: DC | PRN

## 2011-08-18 MED ORDER — HYDROCODONE-ACETAMINOPHEN 7.5-325 MG PO TABS: 1.0000 | ORAL_TABLET | ORAL | Status: AC | PRN

## 2011-08-18 MED ORDER — WARFARIN SODIUM 5 MG PO TABS: 5.0000 mg | ORAL_TABLET | Freq: Once | ORAL | Status: DC

## 2011-08-18 NOTE — Evaluation (Signed)
Occupational Therapy Evaluation Patient Details Name: Sarah [name] MRN: [id number] DOB: May 26, 1932 Today's Date: 08/18/2011  Problem List: There is no problem list on file for this patient.   Past Medical History:  Past Medical History  Diagnosis Date  . PONV (postoperative nausea and vomiting)   . Hypertension   . Hypothyroidism   . Hyperlipemia    Past Surgical History:  Past Surgical History  Procedure Date  . Abdominal hysterectomy   . Tonsillectomy   . Breast surgery     lumpectomy  . Eye surgery     bilateral cataract removal  . Total knee arthroplasty 08/16/2011    Procedure: TOTAL KNEE ARTHROPLASTY;  Surgeon: Loreta Ave, MD;  Location: [facility] OR;  Service: Orthopedics;  Laterality: Right;    OT Assessment/Plan/Recommendation OT Assessment Clinical Impression Statement: Pt presents to OT with decreased I with ADL activity s.p knee replacement. Will benefit from OT to increase I with ADL activity and return to PLOF and decrease burden on her daugther OT Recommendation/Assessment: Patient will need skilled OT in the acute care venue OT Problem List: Decreased strength OT Therapy Diagnosis : Generalized weakness OT Plan OT Frequency: Min 2X/week OT Treatment/Interventions: Self-care/ADL training;DME and/or AE instruction;Patient/family education OT Recommendation Follow Up Recommendations: Home health OT Equipment Recommended: None recommended by PT Individuals Consulted Consulted and Agree with Results and Recommendations: Patient OT Goals Acute Rehab OT Goals OT Goal Formulation: With patient ADL Goals Pt Will Perform Lower Body Dressing: with modified independence;Sit to stand from chair ADL Goal: Lower Body Dressing - Progress: Goal set today Pt Will Transfer to Toilet: with modified independence;Comfort height toilet;Ambulation ADL Goal: Toilet Transfer - Progress: Goal set today Pt Will Perform Tub/Shower Transfer: Tub transfer;Shower seat with  back;Ambulation ADL Goal: Tub/Shower Transfer - Progress: Goal set today  OT Evaluation Precautions/Restrictions  Precautions Precautions: Knee Precaution Booklet Issued: No Required Braces or Orthoses: Yes Knee Immobilizer: On at all times Restrictions Weight Bearing Restrictions: Yes RLE Weight Bearing: Weight bearing as tolerated Prior Functioning Home Living Lives With: Alone Receives Help From:  (Daughter is going to come stay, but currently has Flu.) Type of Home: House Home Layout: Two level;1/2 bath on main level Alternate Level Stairs-Rails: Right Alternate Level Stairs-Number of Steps: 14 Home Access: Stairs to enter Entrance Stairs-Rails: Right;Left;Can reach both Entrance Stairs-Number of Steps: 2 Bathroom Shower/Tub: Tub/shower unit;Door [employer] Toilet: Handicapped height Bathroom Accessibility: Yes Home Adaptive Equipment: Walker - rolling;Straight cane;Shower chair with back Prior Function Level of Independence: Independent with basic ADLs;Independent with homemaking with ambulation;Independent with gait;Independent with transfers Driving: Yes Vocation: Retired ADL ADL Eating/Feeding: Performed;Independent Where Assessed - Eating/Feeding: Chair Grooming: Simulated;Set up Where Assessed - Grooming: Sitting, chair;Supported Upper Body Bathing: Simulated;Set up Where Assessed - Upper Body Bathing: Sitting, chair;Unsupported Lower Body Bathing: Moderate assistance;Performed Where Assessed - Lower Body Bathing: Sit to stand from chair Upper Body Dressing: Simulated;Set up Where Assessed - Upper Body Dressing: Sitting, chair;Unsupported Lower Body Dressing: Performed;Moderate assistance Where Assessed - Lower Body Dressing: Sit to stand from chair Toilet Transfer: Simulated;Minimal assistance Toilet Transfer Method: Stand pivot Toilet Transfer Equipment: Bedside commode Toileting - Clothing Manipulation: Simulated;Moderate assistance Where Assessed -  Toileting Clothing Manipulation: Standing Toileting - Hygiene: Simulated;Moderate assistance Where Assessed - Toileting Hygiene: Standing Tub/Shower Transfer: Not assessed Tub/Shower Transfer Method: Not assessed ADL Comments: Pt in significant pain and eval was limited.  RN called for pain meds.  Vision/Perception  Vision - History Baseline Vision: No visual deficits Vision - Assessment Eye  Alignment: Within Functional Limits Cognition Cognition Arousal/Alertness: Awake/alert Overall Cognitive Status: Appears within functional limits for tasks assessed Orientation Level: Oriented X4    Extremity Assessment RUE Assessment RUE Assessment: Within Functional Limits LUE Assessment LUE Assessment: Within Functional Limits Mobility  Bed Mobility Bed Mobility: Yes Supine to Sit: 4: Min assist;HOB elevated (Comment degrees) (30 degrees.) Supine to Sit Details (indicate cue type and reason): Assist for right LE due to pain as well as trunk with cues for sequence. Transfers Sit to Stand: 4: Min assist;With upper extremity assist;From bed;From chair/3-in-1 (2 trials.) Sit to Stand Details (indicate cue type and reason): Assist for balance with cues for safest hand placement. Stand to Sit: 4: Min assist;With upper extremity assist;To chair/3-in-1 (2 trials.) Stand to Sit Details: Assist to control descent with cues for safest hand placement and right LE placement. Exercises Total Joint Exercises Ankle Circles/Pumps: AROM;Right;10 reps;Supine Quad Sets: AROM;Right;10 reps;Supine Heel Slides: AAROM;Right;10 reps;Supine Hip ABduction/ADduction: AAROM;Right;10 reps;Supine Straight Leg Raises: AAROM;Right;10 reps;Supine Knee Flexion: AAROM;Right (0-17 degrees.) End of Session OT - End of Session Activity Tolerance: Patient limited by pain Patient left: in chair;with call bell in reach Nurse Communication: Other (comment) (need for pain meds) General Behavior During Session: [facility] for  tasks performed Cognition: [facility] for tasks performed   Allisen Pidgeon, [place] Kung 08/18/2011, 10:09 AM

## 2011-08-18 NOTE — Progress Notes (Signed)
Physical Therapy Treatment Patient Details Name: Sarah [name] MRN: [id number] DOB: 06-06-1932 Today's Date: 08/18/2011  PT Assessment/Plan  PT - Assessment/Plan Comments on Treatment Session: Pt admitted for s/p right TKA. Pt with increased anxiety throughout session. Cues for "relax" as patient very tense with therex and resistant towards knee flexion.  PT Plan: Discharge plan remains appropriate PT Frequency: 7X/week Follow Up Recommendations: Home health PT Equipment Recommended: None recommended by PT PT Goals  Acute Rehab PT Goals PT Goal: Sit to Stand - Progress: Progressing toward goal PT Goal: Stand to Sit - Progress: Progressing toward goal PT Goal: Ambulate - Progress: Progressing toward goal PT Goal: Perform Home Exercise Program - Progress: Progressing toward goal  PT Treatment Precautions/Restrictions  Precautions Precautions: Knee Precaution Booklet Issued: No Required Braces or Orthoses: Yes Knee Immobilizer: On at all times Restrictions Weight Bearing Restrictions: Yes RLE Weight Bearing: Weight bearing as tolerated Mobility (including Balance) Bed Mobility Bed Mobility: No Supine to Sit: 4: Min assist;HOB elevated (Comment degrees) (30 degrees.) Transfers Sit to Stand: 4: Min assist;From chair/3-in-1;With upper extremity assist Sit to Stand Details (indicate cue type and reason): A for balance and cues for safe technique. Pt attempts to put weight through back of heels causing posterior lean and LOB Stand to Sit: 4: Min assist;With upper extremity assist;With armrests;To chair/3-in-1 Stand to Sit Details: A to control descent and for right LE placement Ambulation/Gait Ambulation/Gait Assistance: 4: Min assist;Other (comment) (MinGuard A) Ambulation/Gait Assistance Details (indicate cue type and reason): Pt becoming more confident with ambulation. Cues for posture and gait sequence Ambulation Distance (Feet): 100 Feet Assistive device: Rolling walker Gait  Pattern: Step-to pattern;Decreased step length - right;Decreased step length - left;Trunk flexed    Exercise  Total Joint Exercises Ankle Circles/Pumps: AROM;Right;10 reps Quad Sets: AROM;Right;10 reps Heel Slides: AAROM;Right;10 reps Hip ABduction/ADduction: AAROM;Right;10 reps Straight Leg Raises: AAROM;Right;10 reps End of Session PT - End of Session Equipment Utilized During Treatment: Gait belt;Right knee immobilizer Activity Tolerance: Patient tolerated treatment well Patient left: in chair General Behavior During Session: [facility] for tasks performed Cognition: [facility] for tasks performed  [place] Acklin, Adline Potter 08/18/2011, 1:57 PM 08/18/2011 Fredrich Birks PTA [phone] pager [phone] office

## 2011-08-18 NOTE — Progress Notes (Signed)
Physical Therapy Treatment Patient Details Name: Sarah [name] MRN: [id number] DOB: [birth year] Today's Date: 08/18/2011  PT Assessment/Plan  PT - Assessment/Plan Comments on Treatment Session: Pt admitted s/p right TKA and is progressing.  Pt limited by pain, but motivated to increase ambulation distance/independence. PT Plan: Discharge plan remains appropriate;Frequency remains appropriate PT Frequency: 7X/week Follow Up Recommendations: Home health PT Equipment Recommended: None recommended by PT PT Goals  Acute Rehab PT Goals PT Goal Formulation: With patient Time For Goal Achievement: 7 days PT Goal: Supine/Side to Sit - Progress: Progressing toward goal PT Goal: Sit to Stand - Progress: Progressing toward goal PT Goal: Stand to Sit - Progress: Progressing toward goal PT Goal: Ambulate - Progress: Progressing toward goal PT Goal: Perform Home Exercise Program - Progress: Progressing toward goal  PT Treatment Precautions/Restrictions  Precautions Precautions: Knee Precaution Booklet Issued: No Required Braces or Orthoses: Yes Knee Immobilizer: On at all times Restrictions Weight Bearing Restrictions: Yes RLE Weight Bearing: Weight bearing as tolerated Pain 3/10 in right knee with mobility.  Pt repositioned and premedicated. Mobility (including Balance) Bed Mobility Bed Mobility: Yes Supine to Sit: 4: Min assist;HOB elevated (Comment degrees) (30 degrees.) Supine to Sit Details (indicate cue type and reason): Assist for right LE due to pain as well as trunk with cues for sequence. Transfers Transfers: Yes Sit to Stand: 4: Min assist;With upper extremity assist;From bed;From chair/3-in-1 (2 trials.) Sit to Stand Details (indicate cue type and reason): Assist for balance with cues for safest hand placement. Stand to Sit: 4: Min assist;With upper extremity assist;To chair/3-in-1 (2 trials.) Stand to Sit Details: Assist to control descent with cues for safest hand  placement and right LE placement. Stand Pivot Transfers: 4: Min assist Stand Pivot Transfer Details (indicate cue type and reason): Assist for balance with cues for safest sequence. Ambulation/Gait Ambulation/Gait: Yes Ambulation/Gait Assistance: 4: Min assist (Progressing to min (guard)) Ambulation/Gait Assistance Details (indicate cue type and reason): Assist for balance with max cues for sequence with RW as well as tall posture and initial contact on right heel. Ambulation Distance (Feet): 50 Feet Assistive device: Rolling walker Gait Pattern: Step-to pattern;Decreased step length - right;Decreased stance time - right;Trunk flexed Stairs: No Wheelchair Mobility Wheelchair Mobility: No  Posture/Postural Control Posture/Postural Control: No significant limitations Balance Balance Assessed: No Exercise  Total Joint Exercises Ankle Circles/Pumps: AROM;Right;10 reps;Supine Quad Sets: AROM;Right;10 reps;Supine Heel Slides: AAROM;Right;10 reps;Supine Hip ABduction/ADduction: AAROM;Right;10 reps;Supine Straight Leg Raises: AAROM;Right;10 reps;Supine Knee Flexion: AAROM;Right (0-17 degrees.) End of Session PT - End of Session Equipment Utilized During Treatment: Gait belt;Right knee immobilizer Activity Tolerance: Patient tolerated treatment well;Patient limited by pain Patient left: in chair;with call bell in reach Nurse Communication: Mobility status for transfers;Mobility status for ambulation General Behavior During Session: [facility] for tasks performed Cognition: [facility] for tasks performed  Cephus Shelling 08/18/2011, 9:43 AM  08/18/2011 Cephus Shelling, PT, DPT [phone]

## 2011-08-18 NOTE — Progress Notes (Signed)
ANTICOAGULATION CONSULT NOTE - Follow Up Consult  Pharmacy Consult for Warfarin Indication: VTE prophylaxis  Allergies  Allergen Reactions  . Codeine Other (See Comments)    Unknown reaction  . Thiazide-Type Diuretics Other (See Comments)    Blurry vision    Vital Signs: Temp: 98.6 F (37 C) (03/29 0540) Temp src: Oral (03/29 0540) BP: 111/64 mmHg (03/29 0540) Pulse Rate: 86  (03/29 0540)  Labs:  Alvira Philips 08/18/11 0625 08/17/11 0505  HGB 11.5* 12.0  HCT 35.0* 35.9*  PLT 139* 153  APTT -- --  LABPROT 18.4* 15.5*  INR 1.50* 1.20  HEPARINUNFRC -- --  CREATININE 0.49* 0.55  CKTOTAL -- --  CKMB -- --  TROPONINI -- --   CrCl is unknown because there is no height on file for the current visit.   Medications:  Scheduled:    . amLODipine  5 mg Oral BID  .  ceFAZolin (ANCEF) IV  1 g Intravenous Q8H  . docusate sodium  100 mg Oral BID  . enoxaparin  30 mg Subcutaneous Q12H  . levothyroxine  50 mcg Oral QAC breakfast  . simvastatin  10 mg Oral QHS  . warfarin  5 mg Oral ONCE-1800  . warfarin   Does not apply Once  . Warfarin - Pharmacist Dosing Inpatient   Does not apply q1800    Assessment: 78 year old woman s/p knee arthroplasty to continue on warfarin for VTE prophylaxis Goal of Therapy:  INR 2-3   Plan:  Coumadin 5mg  po x 1 today. Continue daily protimes  Mickeal Skinner 08/18/2011,9:47 AM

## 2011-08-18 NOTE — Progress Notes (Signed)
CARE MANAGEMENT NOTE 08/18/2011  Patient:  Sarah [name], Sarah [name]   Account Number:  [id number]  Date Initiated:  08/18/2011  Documentation initiated by:  Vance Peper  Subjective/Objective Assessment:   78 yr old female s/p right total knee arthroplasty     Action/Plan:   Spoke with patient regarding HH needs. Preoperatively setup with [facility], no changes. Has rolling walker, CPM and 3in1 will be delivered.   Anticipated DC Date:  08/19/2011   Anticipated DC Plan:  HOME W HOME HEALTH SERVICES      DC Planning Services  CM consult      [facility] Choice  HOME HEALTH   Choice offered to / List presented to:  C-1 Patient   DME arranged  3-N-1  CPM      DME agency  [employer]     HH arranged  HH-1 RN  HH-2 PT      [facility] agency  [facility]   Status of service:  Completed, signed off  Discharge Disposition:  HOME W HOME HEALTH SERVICES

## 2011-08-18 NOTE — Discharge Instructions (Signed)
Home Health to be provided by [facility] [phone] 

## 2011-08-19 LAB — CBC
MCH: 30.2 pg (ref 26.0–34.0)
MCHC: 33.2 g/dL (ref 30.0–36.0)
MCV: 90.9 fL (ref 78.0–100.0)
Platelets: 157 10*3/uL (ref 150–400)
RDW: 13 % (ref 11.5–15.5)

## 2011-08-19 LAB — BASIC METABOLIC PANEL
CO2: 27 mEq/L (ref 19–32)
Calcium: 9 mg/dL (ref 8.4–10.5)
Creatinine, Ser: 0.43 mg/dL — ABNORMAL LOW (ref 0.50–1.10)
GFR calc Af Amer: >90 mL/min (ref >90)
GFR calc non Af Amer: >90 mL/min (ref >90)
Sodium: 140 mEq/L (ref 135–145)

## 2011-08-19 LAB — PROTIME-INR: Prothrombin Time: 20.3 seconds — ABNORMAL HIGH (ref 11.6–15.2)

## 2011-08-19 MED ORDER — WARFARIN SODIUM 5 MG PO TABS
5.0000 mg | ORAL_TABLET | Freq: Once | ORAL | Status: DC
  Filled 2011-08-19: qty 1

## 2011-08-19 MED ORDER — WHITE PETROLATUM GEL
Status: AC
  Filled 2011-08-19: qty 5

## 2011-08-19 NOTE — Progress Notes (Signed)
Physical Therapy Treatment and d/c summary Patient Details Name: Sarah [name] MRN: [id number] DOB: 1933/02/01 Today's Date: 08/19/2011  PT Assessment/Plan  PT - Assessment/Plan Comments on Treatment Session: Pt to d/c home today, daughter present for education.  Pt only with 40 degrees knee flexion, 0 degrees knee extension.  Concentrated session on knee flexion stretching as well as knee flexion with functional activity.  Pt ready for d/c home eith HHPT. PT Plan: All goals met and education completed, patient dischaged from PT services PT Frequency: 7X/week Follow Up Recommendations: Home health PT Equipment Recommended: None recommended by PT PT Goals  Acute Rehab PT Goals PT Goal Formulation: With patient Time For Goal Achievement: 7 days Pt will go Supine/Side to Sit: with modified independence PT Goal: Supine/Side to Sit - Progress: Met Pt will go Sit to Supine/Side: with modified independence PT Goal: Sit to Supine/Side - Progress: Met Pt will go Sit to Stand: with modified independence PT Goal: Sit to Stand - Progress: Met Pt will go Stand to Sit: with modified independence PT Goal: Stand to Sit - Progress: Met Pt will Ambulate: >150 feet;with supervision;with rolling walker PT Goal: Ambulate - Progress: Met Pt will Go Up / Down Stairs: Flight;with min assist;with least restrictive assistive device PT Goal: Up/Down Stairs - Progress: Met Pt will Perform Home Exercise Program: Independently PT Goal: Perform Home Exercise Program - Progress: Other (comment) (discussed)  PT Treatment Precautions/Restrictions  Precautions Precautions: Knee Precaution Booklet Issued: No Required Braces or Orthoses: Yes Knee Immobilizer: Other (comment) (did not use on d/c day due to sufficient quad) Restrictions Weight Bearing Restrictions: Yes RLE Weight Bearing: Weight bearing as tolerated Mobility (including Balance) Bed Mobility Bed Mobility: Yes Supine to Sit: HOB flat;6:  Modified independent (Device/Increase time) Supine to Sit Details (indicate cue type and reason): pt able to perform bed mobility without assist today.  Education given on using the left leg to assist the right to increase independence Sit to Supine: 6: Modified independent (Device/Increase time);HOB flat Transfers Transfers: Yes Sit to Stand: 6: Modified independent (Device/Increase time);From bed;With upper extremity assist Stand to Sit: 6: Modified independent (Device/Increase time);To bed;With upper extremity assist Ambulation/Gait Ambulation/Gait: Yes Ambulation/Gait Assistance: 5: Supervision Ambulation/Gait Assistance Details (indicate cue type and reason): pt lacking knee flexion on the right to the point that she is hiking right hip to avoid swing-through with a flexed knee.  She reports that she has been ambulating with a straight leg for about 6 months.  Increased quad tightness noted.  Cocentrated on gait training with knee flexion on swing-through and extension for heel strike.  This is a big struggle for pt. Ambulation Distance (Feet): 200 Feet Assistive device: Rolling walker Gait Pattern: Step-through pattern;Decreased step length - left;Decreased stance time - right;Right hip hike;Right circumduction;Trunk flexed Gait velocity: decreased Stairs: Yes Stairs Assistance: 5: Supervision Stairs Assistance Details (indicate cue type and reason): pt safest with sidewyas ascent/ descent and this works in her home Stair Management Technique: One rail Right;Step to pattern;Sideways Number of Stairs: 15  Wheelchair Mobility Wheelchair Mobility: No  Posture/Postural Control Posture/Postural Control: No significant limitations Balance Balance Assessed: Yes Dynamic Standing Balance Dynamic Standing - Balance Support: No upper extremity supported;During functional activity Dynamic Standing - Level of Assistance: 5: Stand by assistance Exercise  Other Exercises Other Exercises:  reviewed exercise program, pt's knee flared up after stretching and ambulation so did not go through all exercises.  Pt to d/c home today and will begin HHPT exercises. End of Session  PT - End of Session Equipment Utilized During Treatment: Gait belt Activity Tolerance: Patient tolerated treatment well Patient left: in bed;with call bell in reach;with family/visitor present Nurse Communication: Mobility status for transfers;Mobility status for ambulation General Behavior During Session: [facility] for tasks performed Cognition: [facility] for tasks performed  Lyanne Co  [phone] 08/19/2011, 12:23 PM

## 2011-08-19 NOTE — Progress Notes (Signed)
Subjective: 3 Days Post-Op Procedure(s) (LRB): TOTAL KNEE ARTHROPLASTY (Right) Patient reports pain as mild.    Objective: Vital signs in last 24 hours: Temp:  [97.2 F (36.2 C)-99.5 F (37.5 C)] 98.8 F (37.1 C) (03/30 0609) Pulse Rate:  [87-94] 87  (03/30 0609) Resp:  [18-19] 19  (03/30 0609) BP: (103-149)/(52-79) 126/74 mmHg (03/30 0609) SpO2:  [96 %-97 %] 96 % (03/30 0609)  Intake/Output from previous day: 03/29 0701 - 03/30 0700 In: 240 [P.O.:240] Out: 300 [Urine:300] Intake/Output this shift:     Basename 08/19/11 0500 08/18/11 0625 08/17/11 0505  HGB 12.0 11.5* 12.0    Basename 08/19/11 0500 08/18/11 0625  WBC 6.2 6.9  RBC 3.97 3.87  HCT 36.1 35.0*  PLT 157 139*    Basename 08/19/11 0500 08/18/11 0625  NA 140 142  K 3.1* 3.6  CL 103 109  CO2 27 25  BUN 11 11  CREATININE 0.43* 0.49*  GLUCOSE 96 101*  CALCIUM 9.0 9.1    Basename 08/19/11 0500 08/18/11 0625  LABPT -- --  INR 1.70* 1.50*    ABD soft Sensation intact distally Dorsiflexion/Plantar flexion intact Incision: dressing C/D/I and no drainage  Assessment/Plan: 3 Days Post-Op Procedure(s) (LRB): TOTAL KNEE ARTHROPLASTY (Right) Advance diet Up with therapy Discharge home with home health if passes PT.  Orders written and RXs call to Pharmacy. There was some questions regarding her capacity to administer Lovenox at home, and I discussed this with the patient and the family, and they are planning on finding helped to achieve this. She will only need Lovenox until her Coumadin is therapeutic. There also having home health to assist with blood draws, wound care, and Lovenox administration when possible.  Haskel Khan 08/19/2011, 9:37 AM

## 2011-08-19 NOTE — Progress Notes (Signed)
ANTICOAGULATION CONSULT NOTE - Follow Up Consult  Pharmacy Consult for Warfarin Indication: VTE prophylaxis  Allergies  Allergen Reactions  . Codeine Other (See Comments)    Unknown reaction  . Thiazide-Type Diuretics Other (See Comments)    Blurry vision    Vital Signs: Temp: 98.8 F (37.1 C) (03/30 0609) BP: 126/74 mmHg (03/30 0609) Pulse Rate: 87  (03/30 0609)  Labs:  Basename 08/19/11 0500 08/18/11 0625 08/17/11 0505  HGB 12.0 11.5* --  HCT 36.1 35.0* 35.9*  PLT 157 139* 153  APTT -- -- --  LABPROT 20.3* 18.4* 15.5*  INR 1.70* 1.50* 1.20  HEPARINUNFRC -- -- --  CREATININE 0.43* 0.49* 0.55  CKTOTAL -- -- --  CKMB -- -- --  TROPONINI -- -- --   CrCl is unknown because there is no height on file for the current visit.   Medications:  Scheduled:     . amLODipine  5 mg Oral BID  . docusate sodium  100 mg Oral BID  . enoxaparin  30 mg Subcutaneous Q12H  . levothyroxine  50 mcg Oral QAC breakfast  . simvastatin  10 mg Oral QHS  . warfarin  5 mg Oral ONCE-1800  . warfarin   Does not apply Once  . Warfarin - Pharmacist Dosing Inpatient   Does not apply q1800  . white petrolatum      . DISCONTD: scopolamine  1 patch Transdermal Once    Assessment: 78 year old woman s/p knee arthroplasty to continue on warfarin for VTE prophylaxis.  INR 1.7 today Goal of Therapy:  INR 2-3   Plan:  Coumadin 5mg  po x 1 today. Continue daily protimes  Mickeal Skinner 08/19/2011,11:13 AM

## 2011-08-20 DIAGNOSIS — Z5181 Encounter for therapeutic drug level monitoring: Secondary | ICD-10-CM | POA: Diagnosis not present

## 2011-08-20 DIAGNOSIS — I10 Essential (primary) hypertension: Secondary | ICD-10-CM | POA: Diagnosis not present

## 2011-08-20 DIAGNOSIS — Z96659 Presence of unspecified artificial knee joint: Secondary | ICD-10-CM | POA: Diagnosis not present

## 2011-08-20 DIAGNOSIS — Z471 Aftercare following joint replacement surgery: Secondary | ICD-10-CM | POA: Diagnosis not present

## 2011-08-20 DIAGNOSIS — M1710 Unilateral primary osteoarthritis, unspecified knee: Secondary | ICD-10-CM | POA: Diagnosis not present

## 2011-08-20 DIAGNOSIS — Z7901 Long term (current) use of anticoagulants: Secondary | ICD-10-CM | POA: Diagnosis not present

## 2011-08-21 DIAGNOSIS — Z471 Aftercare following joint replacement surgery: Secondary | ICD-10-CM | POA: Diagnosis not present

## 2011-08-21 DIAGNOSIS — Z96659 Presence of unspecified artificial knee joint: Secondary | ICD-10-CM | POA: Diagnosis not present

## 2011-08-21 DIAGNOSIS — Z5181 Encounter for therapeutic drug level monitoring: Secondary | ICD-10-CM | POA: Diagnosis not present

## 2011-08-21 DIAGNOSIS — I10 Essential (primary) hypertension: Secondary | ICD-10-CM | POA: Diagnosis not present

## 2011-08-21 DIAGNOSIS — Z7901 Long term (current) use of anticoagulants: Secondary | ICD-10-CM | POA: Diagnosis not present

## 2011-08-22 DIAGNOSIS — Z5181 Encounter for therapeutic drug level monitoring: Secondary | ICD-10-CM | POA: Diagnosis not present

## 2011-08-22 DIAGNOSIS — Z471 Aftercare following joint replacement surgery: Secondary | ICD-10-CM | POA: Diagnosis not present

## 2011-08-22 DIAGNOSIS — Z96659 Presence of unspecified artificial knee joint: Secondary | ICD-10-CM | POA: Diagnosis not present

## 2011-08-22 DIAGNOSIS — I10 Essential (primary) hypertension: Secondary | ICD-10-CM | POA: Diagnosis not present

## 2011-08-22 DIAGNOSIS — Z7901 Long term (current) use of anticoagulants: Secondary | ICD-10-CM | POA: Diagnosis not present

## 2011-08-23 DIAGNOSIS — I10 Essential (primary) hypertension: Secondary | ICD-10-CM | POA: Diagnosis not present

## 2011-08-23 DIAGNOSIS — Z96659 Presence of unspecified artificial knee joint: Secondary | ICD-10-CM | POA: Diagnosis not present

## 2011-08-23 DIAGNOSIS — Z7901 Long term (current) use of anticoagulants: Secondary | ICD-10-CM | POA: Diagnosis not present

## 2011-08-23 DIAGNOSIS — Z5181 Encounter for therapeutic drug level monitoring: Secondary | ICD-10-CM | POA: Diagnosis not present

## 2011-08-23 DIAGNOSIS — Z471 Aftercare following joint replacement surgery: Secondary | ICD-10-CM | POA: Diagnosis not present

## 2011-08-25 DIAGNOSIS — Z471 Aftercare following joint replacement surgery: Secondary | ICD-10-CM | POA: Diagnosis not present

## 2011-08-25 DIAGNOSIS — Z96659 Presence of unspecified artificial knee joint: Secondary | ICD-10-CM | POA: Diagnosis not present

## 2011-08-25 DIAGNOSIS — I10 Essential (primary) hypertension: Secondary | ICD-10-CM | POA: Diagnosis not present

## 2011-08-25 DIAGNOSIS — Z5181 Encounter for therapeutic drug level monitoring: Secondary | ICD-10-CM | POA: Diagnosis not present

## 2011-08-25 DIAGNOSIS — Z7901 Long term (current) use of anticoagulants: Secondary | ICD-10-CM | POA: Diagnosis not present

## 2011-08-27 DIAGNOSIS — Z471 Aftercare following joint replacement surgery: Secondary | ICD-10-CM | POA: Diagnosis not present

## 2011-08-27 DIAGNOSIS — Z7901 Long term (current) use of anticoagulants: Secondary | ICD-10-CM | POA: Diagnosis not present

## 2011-08-27 DIAGNOSIS — I10 Essential (primary) hypertension: Secondary | ICD-10-CM | POA: Diagnosis not present

## 2011-08-27 DIAGNOSIS — Z96659 Presence of unspecified artificial knee joint: Secondary | ICD-10-CM | POA: Diagnosis not present

## 2011-08-27 DIAGNOSIS — Z5181 Encounter for therapeutic drug level monitoring: Secondary | ICD-10-CM | POA: Diagnosis not present

## 2011-08-28 DIAGNOSIS — Z7901 Long term (current) use of anticoagulants: Secondary | ICD-10-CM | POA: Diagnosis not present

## 2011-08-28 DIAGNOSIS — Z96659 Presence of unspecified artificial knee joint: Secondary | ICD-10-CM | POA: Diagnosis not present

## 2011-08-28 DIAGNOSIS — I10 Essential (primary) hypertension: Secondary | ICD-10-CM | POA: Diagnosis not present

## 2011-08-28 DIAGNOSIS — Z471 Aftercare following joint replacement surgery: Secondary | ICD-10-CM | POA: Diagnosis not present

## 2011-08-28 DIAGNOSIS — Z5181 Encounter for therapeutic drug level monitoring: Secondary | ICD-10-CM | POA: Diagnosis not present

## 2011-08-29 DIAGNOSIS — Z7901 Long term (current) use of anticoagulants: Secondary | ICD-10-CM | POA: Diagnosis not present

## 2011-08-29 DIAGNOSIS — I10 Essential (primary) hypertension: Secondary | ICD-10-CM | POA: Diagnosis not present

## 2011-08-29 DIAGNOSIS — Z5181 Encounter for therapeutic drug level monitoring: Secondary | ICD-10-CM | POA: Diagnosis not present

## 2011-08-29 DIAGNOSIS — Z471 Aftercare following joint replacement surgery: Secondary | ICD-10-CM | POA: Diagnosis not present

## 2011-08-29 DIAGNOSIS — Z96659 Presence of unspecified artificial knee joint: Secondary | ICD-10-CM | POA: Diagnosis not present

## 2011-08-30 DIAGNOSIS — I10 Essential (primary) hypertension: Secondary | ICD-10-CM | POA: Diagnosis not present

## 2011-08-30 DIAGNOSIS — Z96659 Presence of unspecified artificial knee joint: Secondary | ICD-10-CM | POA: Diagnosis not present

## 2011-08-30 DIAGNOSIS — Z5181 Encounter for therapeutic drug level monitoring: Secondary | ICD-10-CM | POA: Diagnosis not present

## 2011-08-30 DIAGNOSIS — Z7901 Long term (current) use of anticoagulants: Secondary | ICD-10-CM | POA: Diagnosis not present

## 2011-08-30 DIAGNOSIS — Z471 Aftercare following joint replacement surgery: Secondary | ICD-10-CM | POA: Diagnosis not present

## 2011-08-30 NOTE — Discharge Summary (Signed)
Sarah [name], Sarah [name] NO.:  [id number]  MEDICAL RECORD NO.:  [id number]  LOCATION:  5010                         FACILITY:  [facility]  PHYSICIAN:  Genene Churn. Barry Dienes, P.A.   DATE OF BIRTH:  [birth year]  DATE OF ADMISSION:  08/16/2011 DATE OF DISCHARGE:  08/19/2011                              DISCHARGE SUMMARY   FINAL DIAGNOSES: 1. Status post right total knee replacement for end-stage degenerative     joint disease. 2. Hypercholesterolemia. 3. Hypertension. 4. Hypothyroidism. 5. Sleep disorder.  HISTORY OF PRESENT ILLNESS:  This is a 78 year old white female with a history of end-stage DJD of right knee and chronic pain, presented to our office for preop evaluation for total knee replacement.  She had progressively worsening pain with failed response with conservative treatment.  Significant decrease in her daily activities due to the ongoing complaint.  HOSPITAL COURSE:  On August 16, 2011, the patient was taken to the [facility] OR and a right total knee replacement procedure was performed. Surgeon, Mckinley Jewel.  Assistant, Zonia Kief PA-C.  Anesthesia was general with femoral nerve block. Estimated blood loss was minimal.  No specimens or cultures.  All counts correct.  Tourniquet time was 72 minutes.  There were no surgical or anesthesia complications and the patient was transferred to recovery in stable condition.  After arriving to the Orthopedic Unit, pharmacy protocol Coumadin and Lovenox started for DVT prophylaxis.  On August 17, 2011, the patient was doing well with good pain control.  Hemoglobin 12.0 and hematocrit 35.9, glucose 109, INR 1.20.  Dressing was clean, dry, intact.  Calf nontender neurovascularly.  Skin warm and dry. Discontinued Dilaudid.  PT/OT consults.  August 19, 2011, the patient was doing well.  Wound looked good and staples intact.  There was no drainage or signs of infection.  Hemovac drain was removed.  The patient was doing well  and was ready for discharge home.  DISPOSITION:  Discharged home.  CONDITION:  Good and stable.  DISCHARGE MEDICATIONS: 1. Norco 7.5/325, 1-2 tabs p.o. q.4-6 hours p.r.n. for pain. 2. Robaxin 500 mg 1 tab p.o. q.6 hours p.r.n. for spasms. 3. Lovenox 30 mg 1 subcu injection q.12 hours and discontinue when     Coumadin is therapeutic with INR 2-3. 4. Coumadin pharmacy protocol.  Home health pharmacist to maintain INR     2-3 x4 weeks postop for DVT prophylaxis.  DISCHARGE INSTRUCTIONS:  While at home, the patient will work with home health PT and OT to improve ambulation and knee range of motion and strengthening.  Weight-bear as tolerated with walker and can wean to a cane as tolerated.  Okay to shower, but no tub soaking.  Will remain on Coumadin x4 weeks postop for DVT prophylaxis.  Discontinue Lovenox when Coumadin is therapeutic with INR 2-3.  Daily dressing changes with 4x4 gauze and apply TED hose over this.  Follow up when she is 2 weeks postop for recheck, but will return sooner if needed.     Genene Churn. Denton Meek.     JMO/MEDQ  D:  08/30/2011  T:  08/30/2011  Job:  [id number]

## 2011-08-31 DIAGNOSIS — Z471 Aftercare following joint replacement surgery: Secondary | ICD-10-CM | POA: Diagnosis not present

## 2011-08-31 DIAGNOSIS — Z7901 Long term (current) use of anticoagulants: Secondary | ICD-10-CM | POA: Diagnosis not present

## 2011-08-31 DIAGNOSIS — Z5181 Encounter for therapeutic drug level monitoring: Secondary | ICD-10-CM | POA: Diagnosis not present

## 2011-08-31 DIAGNOSIS — Z96659 Presence of unspecified artificial knee joint: Secondary | ICD-10-CM | POA: Diagnosis not present

## 2011-08-31 DIAGNOSIS — I10 Essential (primary) hypertension: Secondary | ICD-10-CM | POA: Diagnosis not present

## 2011-09-01 DIAGNOSIS — Z5181 Encounter for therapeutic drug level monitoring: Secondary | ICD-10-CM | POA: Diagnosis not present

## 2011-09-01 DIAGNOSIS — Z471 Aftercare following joint replacement surgery: Secondary | ICD-10-CM | POA: Diagnosis not present

## 2011-09-01 DIAGNOSIS — Z7901 Long term (current) use of anticoagulants: Secondary | ICD-10-CM | POA: Diagnosis not present

## 2011-09-01 DIAGNOSIS — I10 Essential (primary) hypertension: Secondary | ICD-10-CM | POA: Diagnosis not present

## 2011-09-01 DIAGNOSIS — Z96659 Presence of unspecified artificial knee joint: Secondary | ICD-10-CM | POA: Diagnosis not present

## 2011-09-03 DIAGNOSIS — Z7901 Long term (current) use of anticoagulants: Secondary | ICD-10-CM | POA: Diagnosis not present

## 2011-09-03 DIAGNOSIS — Z5181 Encounter for therapeutic drug level monitoring: Secondary | ICD-10-CM | POA: Diagnosis not present

## 2011-09-03 DIAGNOSIS — Z96659 Presence of unspecified artificial knee joint: Secondary | ICD-10-CM | POA: Diagnosis not present

## 2011-09-03 DIAGNOSIS — Z471 Aftercare following joint replacement surgery: Secondary | ICD-10-CM | POA: Diagnosis not present

## 2011-09-03 DIAGNOSIS — I10 Essential (primary) hypertension: Secondary | ICD-10-CM | POA: Diagnosis not present

## 2011-09-04 DIAGNOSIS — H5310 Unspecified subjective visual disturbances: Secondary | ICD-10-CM | POA: Diagnosis not present

## 2011-09-04 DIAGNOSIS — H3530 Unspecified macular degeneration: Secondary | ICD-10-CM | POA: Diagnosis not present

## 2011-09-04 DIAGNOSIS — H538 Other visual disturbances: Secondary | ICD-10-CM | POA: Diagnosis not present

## 2011-09-05 ENCOUNTER — Other Ambulatory Visit: Payer: Self-pay | Admitting: Geriatric Medicine

## 2011-09-05 DIAGNOSIS — Z5181 Encounter for therapeutic drug level monitoring: Secondary | ICD-10-CM | POA: Diagnosis not present

## 2011-09-05 DIAGNOSIS — Z471 Aftercare following joint replacement surgery: Secondary | ICD-10-CM | POA: Diagnosis not present

## 2011-09-05 DIAGNOSIS — I10 Essential (primary) hypertension: Secondary | ICD-10-CM | POA: Diagnosis not present

## 2011-09-05 DIAGNOSIS — R9389 Abnormal findings on diagnostic imaging of other specified body structures: Secondary | ICD-10-CM

## 2011-09-05 DIAGNOSIS — Z96659 Presence of unspecified artificial knee joint: Secondary | ICD-10-CM | POA: Diagnosis not present

## 2011-09-05 DIAGNOSIS — Z7901 Long term (current) use of anticoagulants: Secondary | ICD-10-CM | POA: Diagnosis not present

## 2011-09-06 ENCOUNTER — Ambulatory Visit: Admission: RE | Admit: 2011-09-06 | Payer: Medicare Other | Source: Ambulatory Visit

## 2011-09-06 ENCOUNTER — Encounter (INDEPENDENT_AMBULATORY_CARE_PROVIDER_SITE_OTHER): Payer: Medicare Other | Admitting: Ophthalmology

## 2011-09-06 ENCOUNTER — Ambulatory Visit
Admission: RE | Admit: 2011-09-06 | Discharge: 2011-09-06 | Disposition: A | Discharge status: Home / Self Care | Payer: Medicare Other | Source: Ambulatory Visit | Attending: Geriatric Medicine | Admitting: Geriatric Medicine

## 2011-09-06 DIAGNOSIS — H3530 Unspecified macular degeneration: Secondary | ICD-10-CM | POA: Diagnosis not present

## 2011-09-06 DIAGNOSIS — Z471 Aftercare following joint replacement surgery: Secondary | ICD-10-CM | POA: Diagnosis not present

## 2011-09-06 DIAGNOSIS — R188 Other ascites: Secondary | ICD-10-CM | POA: Diagnosis not present

## 2011-09-06 DIAGNOSIS — R9389 Abnormal findings on diagnostic imaging of other specified body structures: Secondary | ICD-10-CM

## 2011-09-06 DIAGNOSIS — J984 Other disorders of lung: Secondary | ICD-10-CM | POA: Diagnosis not present

## 2011-09-06 DIAGNOSIS — Z96659 Presence of unspecified artificial knee joint: Secondary | ICD-10-CM | POA: Diagnosis not present

## 2011-09-06 DIAGNOSIS — Z5181 Encounter for therapeutic drug level monitoring: Secondary | ICD-10-CM | POA: Diagnosis not present

## 2011-09-06 DIAGNOSIS — H35039 Hypertensive retinopathy, unspecified eye: Secondary | ICD-10-CM

## 2011-09-06 DIAGNOSIS — I10 Essential (primary) hypertension: Secondary | ICD-10-CM

## 2011-09-06 DIAGNOSIS — Z7901 Long term (current) use of anticoagulants: Secondary | ICD-10-CM | POA: Diagnosis not present

## 2011-09-06 DIAGNOSIS — R911 Solitary pulmonary nodule: Secondary | ICD-10-CM | POA: Diagnosis not present

## 2011-09-06 DIAGNOSIS — H43819 Vitreous degeneration, unspecified eye: Secondary | ICD-10-CM | POA: Diagnosis not present

## 2011-09-06 MED ORDER — IOHEXOL 300 MG/ML  SOLN
75.0000 mL | Freq: Once | INTRAMUSCULAR | Status: AC | PRN
  Administered 2011-09-06: 75 mL via INTRAVENOUS

## 2011-09-07 DIAGNOSIS — Z5181 Encounter for therapeutic drug level monitoring: Secondary | ICD-10-CM | POA: Diagnosis not present

## 2011-09-07 DIAGNOSIS — Z7901 Long term (current) use of anticoagulants: Secondary | ICD-10-CM | POA: Diagnosis not present

## 2011-09-07 DIAGNOSIS — Z96659 Presence of unspecified artificial knee joint: Secondary | ICD-10-CM | POA: Diagnosis not present

## 2011-09-07 DIAGNOSIS — I10 Essential (primary) hypertension: Secondary | ICD-10-CM | POA: Diagnosis not present

## 2011-09-07 DIAGNOSIS — Z471 Aftercare following joint replacement surgery: Secondary | ICD-10-CM | POA: Diagnosis not present

## 2011-09-08 ENCOUNTER — Other Ambulatory Visit: Payer: Self-pay | Admitting: Geriatric Medicine

## 2011-09-08 DIAGNOSIS — Z471 Aftercare following joint replacement surgery: Secondary | ICD-10-CM | POA: Diagnosis not present

## 2011-09-08 DIAGNOSIS — I10 Essential (primary) hypertension: Secondary | ICD-10-CM | POA: Diagnosis not present

## 2011-09-08 DIAGNOSIS — R9389 Abnormal findings on diagnostic imaging of other specified body structures: Secondary | ICD-10-CM

## 2011-09-08 DIAGNOSIS — Z96659 Presence of unspecified artificial knee joint: Secondary | ICD-10-CM | POA: Diagnosis not present

## 2011-09-08 DIAGNOSIS — Z7901 Long term (current) use of anticoagulants: Secondary | ICD-10-CM | POA: Diagnosis not present

## 2011-09-08 DIAGNOSIS — Z5181 Encounter for therapeutic drug level monitoring: Secondary | ICD-10-CM | POA: Diagnosis not present

## 2011-09-11 DIAGNOSIS — I10 Essential (primary) hypertension: Secondary | ICD-10-CM | POA: Diagnosis not present

## 2011-09-11 DIAGNOSIS — Z471 Aftercare following joint replacement surgery: Secondary | ICD-10-CM | POA: Diagnosis not present

## 2011-09-11 DIAGNOSIS — Z96659 Presence of unspecified artificial knee joint: Secondary | ICD-10-CM | POA: Diagnosis not present

## 2011-09-11 DIAGNOSIS — Z7901 Long term (current) use of anticoagulants: Secondary | ICD-10-CM | POA: Diagnosis not present

## 2011-09-11 DIAGNOSIS — Z5181 Encounter for therapeutic drug level monitoring: Secondary | ICD-10-CM | POA: Diagnosis not present

## 2011-09-12 DIAGNOSIS — Z5181 Encounter for therapeutic drug level monitoring: Secondary | ICD-10-CM | POA: Diagnosis not present

## 2011-09-12 DIAGNOSIS — Z471 Aftercare following joint replacement surgery: Secondary | ICD-10-CM | POA: Diagnosis not present

## 2011-09-12 DIAGNOSIS — I10 Essential (primary) hypertension: Secondary | ICD-10-CM | POA: Diagnosis not present

## 2011-09-12 DIAGNOSIS — Z96659 Presence of unspecified artificial knee joint: Secondary | ICD-10-CM | POA: Diagnosis not present

## 2011-09-12 DIAGNOSIS — Z7901 Long term (current) use of anticoagulants: Secondary | ICD-10-CM | POA: Diagnosis not present

## 2011-09-13 DIAGNOSIS — Z7901 Long term (current) use of anticoagulants: Secondary | ICD-10-CM | POA: Diagnosis not present

## 2011-09-13 DIAGNOSIS — Z471 Aftercare following joint replacement surgery: Secondary | ICD-10-CM | POA: Diagnosis not present

## 2011-09-13 DIAGNOSIS — Z96659 Presence of unspecified artificial knee joint: Secondary | ICD-10-CM | POA: Diagnosis not present

## 2011-09-13 DIAGNOSIS — I10 Essential (primary) hypertension: Secondary | ICD-10-CM | POA: Diagnosis not present

## 2011-09-13 DIAGNOSIS — Z5181 Encounter for therapeutic drug level monitoring: Secondary | ICD-10-CM | POA: Diagnosis not present

## 2011-09-14 ENCOUNTER — Ambulatory Visit
Admission: RE | Admit: 2011-09-14 | Discharge: 2011-09-14 | Disposition: A | Discharge status: Home / Self Care | Payer: Medicare Other | Source: Ambulatory Visit | Attending: Geriatric Medicine | Admitting: Geriatric Medicine

## 2011-09-14 DIAGNOSIS — Z96659 Presence of unspecified artificial knee joint: Secondary | ICD-10-CM | POA: Diagnosis not present

## 2011-09-14 DIAGNOSIS — R9389 Abnormal findings on diagnostic imaging of other specified body structures: Secondary | ICD-10-CM

## 2011-09-14 DIAGNOSIS — Z7901 Long term (current) use of anticoagulants: Secondary | ICD-10-CM | POA: Diagnosis not present

## 2011-09-14 DIAGNOSIS — Z471 Aftercare following joint replacement surgery: Secondary | ICD-10-CM | POA: Diagnosis not present

## 2011-09-14 DIAGNOSIS — K862 Cyst of pancreas: Secondary | ICD-10-CM | POA: Diagnosis not present

## 2011-09-14 DIAGNOSIS — K863 Pseudocyst of pancreas: Secondary | ICD-10-CM | POA: Diagnosis not present

## 2011-09-14 DIAGNOSIS — Z5181 Encounter for therapeutic drug level monitoring: Secondary | ICD-10-CM | POA: Diagnosis not present

## 2011-09-14 DIAGNOSIS — I10 Essential (primary) hypertension: Secondary | ICD-10-CM | POA: Diagnosis not present

## 2011-09-14 MED ORDER — GADOBENATE DIMEGLUMINE 529 MG/ML IV SOLN: 12.0000 mL | Freq: Once | INTRAVENOUS | Status: AC | PRN

## 2011-09-15 DIAGNOSIS — Z96659 Presence of unspecified artificial knee joint: Secondary | ICD-10-CM | POA: Diagnosis not present

## 2011-09-15 DIAGNOSIS — Z5181 Encounter for therapeutic drug level monitoring: Secondary | ICD-10-CM | POA: Diagnosis not present

## 2011-09-15 DIAGNOSIS — Z471 Aftercare following joint replacement surgery: Secondary | ICD-10-CM | POA: Diagnosis not present

## 2011-09-15 DIAGNOSIS — Z7901 Long term (current) use of anticoagulants: Secondary | ICD-10-CM | POA: Diagnosis not present

## 2011-09-15 DIAGNOSIS — I10 Essential (primary) hypertension: Secondary | ICD-10-CM | POA: Diagnosis not present

## 2011-09-18 DIAGNOSIS — K863 Pseudocyst of pancreas: Secondary | ICD-10-CM | POA: Diagnosis not present

## 2011-09-18 DIAGNOSIS — K862 Cyst of pancreas: Secondary | ICD-10-CM | POA: Diagnosis not present

## 2011-09-19 DIAGNOSIS — M25569 Pain in unspecified knee: Secondary | ICD-10-CM | POA: Diagnosis not present

## 2011-09-19 DIAGNOSIS — M25669 Stiffness of unspecified knee, not elsewhere classified: Secondary | ICD-10-CM | POA: Diagnosis not present

## 2011-09-19 DIAGNOSIS — M1710 Unilateral primary osteoarthritis, unspecified knee: Secondary | ICD-10-CM | POA: Diagnosis not present

## 2011-09-19 DIAGNOSIS — R269 Unspecified abnormalities of gait and mobility: Secondary | ICD-10-CM | POA: Diagnosis not present

## 2011-09-20 DIAGNOSIS — M25569 Pain in unspecified knee: Secondary | ICD-10-CM | POA: Diagnosis not present

## 2011-09-20 DIAGNOSIS — M6281 Muscle weakness (generalized): Secondary | ICD-10-CM | POA: Diagnosis not present

## 2011-09-20 DIAGNOSIS — M25669 Stiffness of unspecified knee, not elsewhere classified: Secondary | ICD-10-CM | POA: Diagnosis not present

## 2011-09-22 DIAGNOSIS — M6281 Muscle weakness (generalized): Secondary | ICD-10-CM | POA: Diagnosis not present

## 2011-09-22 DIAGNOSIS — M25669 Stiffness of unspecified knee, not elsewhere classified: Secondary | ICD-10-CM | POA: Diagnosis not present

## 2011-09-22 DIAGNOSIS — M25569 Pain in unspecified knee: Secondary | ICD-10-CM | POA: Diagnosis not present

## 2011-09-25 DIAGNOSIS — M6281 Muscle weakness (generalized): Secondary | ICD-10-CM | POA: Diagnosis not present

## 2011-09-25 DIAGNOSIS — M25669 Stiffness of unspecified knee, not elsewhere classified: Secondary | ICD-10-CM | POA: Diagnosis not present

## 2011-09-25 DIAGNOSIS — M25569 Pain in unspecified knee: Secondary | ICD-10-CM | POA: Diagnosis not present

## 2011-09-27 DIAGNOSIS — M6281 Muscle weakness (generalized): Secondary | ICD-10-CM | POA: Diagnosis not present

## 2011-09-27 DIAGNOSIS — M25669 Stiffness of unspecified knee, not elsewhere classified: Secondary | ICD-10-CM | POA: Diagnosis not present

## 2011-09-27 DIAGNOSIS — M25569 Pain in unspecified knee: Secondary | ICD-10-CM | POA: Diagnosis not present

## 2011-09-28 DIAGNOSIS — M6281 Muscle weakness (generalized): Secondary | ICD-10-CM | POA: Diagnosis not present

## 2011-09-28 DIAGNOSIS — M25569 Pain in unspecified knee: Secondary | ICD-10-CM | POA: Diagnosis not present

## 2011-09-28 DIAGNOSIS — M25669 Stiffness of unspecified knee, not elsewhere classified: Secondary | ICD-10-CM | POA: Diagnosis not present

## 2011-09-29 DIAGNOSIS — M1710 Unilateral primary osteoarthritis, unspecified knee: Secondary | ICD-10-CM | POA: Diagnosis not present

## 2011-09-29 DIAGNOSIS — Z471 Aftercare following joint replacement surgery: Secondary | ICD-10-CM | POA: Diagnosis not present

## 2011-10-02 DIAGNOSIS — R269 Unspecified abnormalities of gait and mobility: Secondary | ICD-10-CM | POA: Diagnosis not present

## 2011-10-02 DIAGNOSIS — M6281 Muscle weakness (generalized): Secondary | ICD-10-CM | POA: Diagnosis not present

## 2011-10-02 DIAGNOSIS — M1710 Unilateral primary osteoarthritis, unspecified knee: Secondary | ICD-10-CM | POA: Diagnosis not present

## 2011-10-02 DIAGNOSIS — M25569 Pain in unspecified knee: Secondary | ICD-10-CM | POA: Diagnosis not present

## 2011-10-04 DIAGNOSIS — M25569 Pain in unspecified knee: Secondary | ICD-10-CM | POA: Diagnosis not present

## 2011-10-04 DIAGNOSIS — M6281 Muscle weakness (generalized): Secondary | ICD-10-CM | POA: Diagnosis not present

## 2011-10-04 DIAGNOSIS — M25669 Stiffness of unspecified knee, not elsewhere classified: Secondary | ICD-10-CM | POA: Diagnosis not present

## 2011-10-06 DIAGNOSIS — M25669 Stiffness of unspecified knee, not elsewhere classified: Secondary | ICD-10-CM | POA: Diagnosis not present

## 2011-10-06 DIAGNOSIS — M6281 Muscle weakness (generalized): Secondary | ICD-10-CM | POA: Diagnosis not present

## 2011-10-06 DIAGNOSIS — M25569 Pain in unspecified knee: Secondary | ICD-10-CM | POA: Diagnosis not present

## 2011-10-09 DIAGNOSIS — M6281 Muscle weakness (generalized): Secondary | ICD-10-CM | POA: Diagnosis not present

## 2011-10-09 DIAGNOSIS — M25569 Pain in unspecified knee: Secondary | ICD-10-CM | POA: Diagnosis not present

## 2011-10-09 DIAGNOSIS — M25669 Stiffness of unspecified knee, not elsewhere classified: Secondary | ICD-10-CM | POA: Diagnosis not present

## 2011-10-11 DIAGNOSIS — M6281 Muscle weakness (generalized): Secondary | ICD-10-CM | POA: Diagnosis not present

## 2011-10-11 DIAGNOSIS — M25669 Stiffness of unspecified knee, not elsewhere classified: Secondary | ICD-10-CM | POA: Diagnosis not present

## 2011-10-11 DIAGNOSIS — M25569 Pain in unspecified knee: Secondary | ICD-10-CM | POA: Diagnosis not present

## 2011-10-13 DIAGNOSIS — M6281 Muscle weakness (generalized): Secondary | ICD-10-CM | POA: Diagnosis not present

## 2011-10-13 DIAGNOSIS — M25569 Pain in unspecified knee: Secondary | ICD-10-CM | POA: Diagnosis not present

## 2011-10-13 DIAGNOSIS — M25669 Stiffness of unspecified knee, not elsewhere classified: Secondary | ICD-10-CM | POA: Diagnosis not present

## 2011-10-17 DIAGNOSIS — M25669 Stiffness of unspecified knee, not elsewhere classified: Secondary | ICD-10-CM | POA: Diagnosis not present

## 2011-10-17 DIAGNOSIS — M25569 Pain in unspecified knee: Secondary | ICD-10-CM | POA: Diagnosis not present

## 2011-10-17 DIAGNOSIS — M6281 Muscle weakness (generalized): Secondary | ICD-10-CM | POA: Diagnosis not present

## 2011-10-20 DIAGNOSIS — M25569 Pain in unspecified knee: Secondary | ICD-10-CM | POA: Diagnosis not present

## 2011-10-20 DIAGNOSIS — M6281 Muscle weakness (generalized): Secondary | ICD-10-CM | POA: Diagnosis not present

## 2011-10-20 DIAGNOSIS — M25669 Stiffness of unspecified knee, not elsewhere classified: Secondary | ICD-10-CM | POA: Diagnosis not present

## 2011-10-23 DIAGNOSIS — M25669 Stiffness of unspecified knee, not elsewhere classified: Secondary | ICD-10-CM | POA: Diagnosis not present

## 2011-10-23 DIAGNOSIS — M6281 Muscle weakness (generalized): Secondary | ICD-10-CM | POA: Diagnosis not present

## 2011-10-23 DIAGNOSIS — M25569 Pain in unspecified knee: Secondary | ICD-10-CM | POA: Diagnosis not present

## 2011-10-25 DIAGNOSIS — M25669 Stiffness of unspecified knee, not elsewhere classified: Secondary | ICD-10-CM | POA: Diagnosis not present

## 2011-10-25 DIAGNOSIS — M25569 Pain in unspecified knee: Secondary | ICD-10-CM | POA: Diagnosis not present

## 2011-10-25 DIAGNOSIS — M6281 Muscle weakness (generalized): Secondary | ICD-10-CM | POA: Diagnosis not present

## 2011-10-27 DIAGNOSIS — M25669 Stiffness of unspecified knee, not elsewhere classified: Secondary | ICD-10-CM | POA: Diagnosis not present

## 2011-10-27 DIAGNOSIS — M6281 Muscle weakness (generalized): Secondary | ICD-10-CM | POA: Diagnosis not present

## 2011-10-27 DIAGNOSIS — M25569 Pain in unspecified knee: Secondary | ICD-10-CM | POA: Diagnosis not present

## 2011-10-30 DIAGNOSIS — M25669 Stiffness of unspecified knee, not elsewhere classified: Secondary | ICD-10-CM | POA: Diagnosis not present

## 2011-10-30 DIAGNOSIS — M25569 Pain in unspecified knee: Secondary | ICD-10-CM | POA: Diagnosis not present

## 2011-10-30 DIAGNOSIS — M6281 Muscle weakness (generalized): Secondary | ICD-10-CM | POA: Diagnosis not present

## 2011-11-02 DIAGNOSIS — M25569 Pain in unspecified knee: Secondary | ICD-10-CM | POA: Diagnosis not present

## 2011-11-02 DIAGNOSIS — M6281 Muscle weakness (generalized): Secondary | ICD-10-CM | POA: Diagnosis not present

## 2011-11-02 DIAGNOSIS — M25669 Stiffness of unspecified knee, not elsewhere classified: Secondary | ICD-10-CM | POA: Diagnosis not present

## 2011-11-08 DIAGNOSIS — M25569 Pain in unspecified knee: Secondary | ICD-10-CM | POA: Diagnosis not present

## 2011-11-08 DIAGNOSIS — M6281 Muscle weakness (generalized): Secondary | ICD-10-CM | POA: Diagnosis not present

## 2011-11-08 DIAGNOSIS — M25669 Stiffness of unspecified knee, not elsewhere classified: Secondary | ICD-10-CM | POA: Diagnosis not present

## 2011-11-10 DIAGNOSIS — M25569 Pain in unspecified knee: Secondary | ICD-10-CM | POA: Diagnosis not present

## 2011-11-10 DIAGNOSIS — M6281 Muscle weakness (generalized): Secondary | ICD-10-CM | POA: Diagnosis not present

## 2011-11-10 DIAGNOSIS — M25669 Stiffness of unspecified knee, not elsewhere classified: Secondary | ICD-10-CM | POA: Diagnosis not present

## 2011-11-14 DIAGNOSIS — Z471 Aftercare following joint replacement surgery: Secondary | ICD-10-CM | POA: Diagnosis not present

## 2011-11-14 DIAGNOSIS — M1710 Unilateral primary osteoarthritis, unspecified knee: Secondary | ICD-10-CM | POA: Diagnosis not present

## 2011-12-08 ENCOUNTER — Other Ambulatory Visit: Payer: Self-pay | Admitting: Geriatric Medicine

## 2011-12-08 DIAGNOSIS — K862 Cyst of pancreas: Secondary | ICD-10-CM

## 2011-12-08 DIAGNOSIS — Z79899 Other long term (current) drug therapy: Secondary | ICD-10-CM | POA: Diagnosis not present

## 2011-12-08 DIAGNOSIS — I10 Essential (primary) hypertension: Secondary | ICD-10-CM | POA: Diagnosis not present

## 2011-12-08 DIAGNOSIS — K863 Pseudocyst of pancreas: Secondary | ICD-10-CM | POA: Diagnosis not present

## 2011-12-08 DIAGNOSIS — E7800 Pure hypercholesterolemia, unspecified: Secondary | ICD-10-CM | POA: Diagnosis not present

## 2011-12-08 DIAGNOSIS — N951 Menopausal and female climacteric states: Secondary | ICD-10-CM | POA: Diagnosis not present

## 2011-12-08 DIAGNOSIS — D126 Benign neoplasm of colon, unspecified: Secondary | ICD-10-CM | POA: Diagnosis not present

## 2011-12-28 DIAGNOSIS — Z780 Asymptomatic menopausal state: Secondary | ICD-10-CM | POA: Diagnosis not present

## 2012-02-05 DIAGNOSIS — Z86010 Personal history of colonic polyps: Secondary | ICD-10-CM | POA: Diagnosis not present

## 2012-02-05 DIAGNOSIS — Q438 Other specified congenital malformations of intestine: Secondary | ICD-10-CM | POA: Diagnosis not present

## 2012-02-05 DIAGNOSIS — K5730 Diverticulosis of large intestine without perforation or abscess without bleeding: Secondary | ICD-10-CM | POA: Diagnosis not present

## 2012-02-05 DIAGNOSIS — Z09 Encounter for follow-up examination after completed treatment for conditions other than malignant neoplasm: Secondary | ICD-10-CM | POA: Diagnosis not present

## 2012-02-23 ENCOUNTER — Ambulatory Visit
Admission: RE | Admit: 2012-02-23 | Discharge: 2012-02-23 | Disposition: A | Discharge status: Home / Self Care | Payer: Medicare Other | Source: Ambulatory Visit | Attending: Geriatric Medicine | Admitting: Geriatric Medicine

## 2012-02-23 DIAGNOSIS — K863 Pseudocyst of pancreas: Secondary | ICD-10-CM | POA: Diagnosis not present

## 2012-02-23 DIAGNOSIS — K862 Cyst of pancreas: Secondary | ICD-10-CM

## 2012-02-23 MED ORDER — GADOBENATE DIMEGLUMINE 529 MG/ML IV SOLN
10.0000 mL | Freq: Once | INTRAVENOUS | Status: AC | PRN
  Administered 2012-02-23: 10 mL via INTRAVENOUS

## 2012-02-28 DIAGNOSIS — H8110 Benign paroxysmal vertigo, unspecified ear: Secondary | ICD-10-CM | POA: Diagnosis not present

## 2012-02-28 DIAGNOSIS — Z23 Encounter for immunization: Secondary | ICD-10-CM | POA: Diagnosis not present

## 2012-02-28 DIAGNOSIS — Z09 Encounter for follow-up examination after completed treatment for conditions other than malignant neoplasm: Secondary | ICD-10-CM | POA: Diagnosis not present

## 2012-02-28 DIAGNOSIS — R928 Other abnormal and inconclusive findings on diagnostic imaging of breast: Secondary | ICD-10-CM | POA: Diagnosis not present

## 2012-03-11 ENCOUNTER — Ambulatory Visit: Payer: Medicare Other | Attending: Geriatric Medicine | Admitting: Physical Therapy

## 2012-03-11 DIAGNOSIS — R269 Unspecified abnormalities of gait and mobility: Secondary | ICD-10-CM | POA: Diagnosis not present

## 2012-03-11 DIAGNOSIS — H8110 Benign paroxysmal vertigo, unspecified ear: Secondary | ICD-10-CM | POA: Diagnosis not present

## 2012-03-11 DIAGNOSIS — IMO0001 Reserved for inherently not codable concepts without codable children: Secondary | ICD-10-CM | POA: Diagnosis not present

## 2012-03-14 ENCOUNTER — Other Ambulatory Visit: Payer: Self-pay | Admitting: Radiology

## 2012-03-14 DIAGNOSIS — R928 Other abnormal and inconclusive findings on diagnostic imaging of breast: Secondary | ICD-10-CM | POA: Diagnosis not present

## 2012-03-14 DIAGNOSIS — Z09 Encounter for follow-up examination after completed treatment for conditions other than malignant neoplasm: Secondary | ICD-10-CM | POA: Diagnosis not present

## 2012-03-14 DIAGNOSIS — D0590 Unspecified type of carcinoma in situ of unspecified breast: Secondary | ICD-10-CM | POA: Diagnosis not present

## 2012-03-14 DIAGNOSIS — C50919 Malignant neoplasm of unspecified site of unspecified female breast: Secondary | ICD-10-CM | POA: Insufficient documentation

## 2012-03-14 DIAGNOSIS — Z0189 Encounter for other specified special examinations: Secondary | ICD-10-CM | POA: Diagnosis not present

## 2012-03-14 HISTORY — DX: Malignant neoplasm of unspecified site of unspecified female breast: C50.919

## 2012-03-15 ENCOUNTER — Ambulatory Visit: Payer: Medicare Other | Admitting: Physical Therapy

## 2012-03-19 ENCOUNTER — Ambulatory Visit (INDEPENDENT_AMBULATORY_CARE_PROVIDER_SITE_OTHER): Payer: Medicare Other | Admitting: General Surgery

## 2012-03-19 ENCOUNTER — Other Ambulatory Visit (INDEPENDENT_AMBULATORY_CARE_PROVIDER_SITE_OTHER): Payer: Self-pay | Admitting: General Surgery

## 2012-03-19 ENCOUNTER — Encounter (INDEPENDENT_AMBULATORY_CARE_PROVIDER_SITE_OTHER): Payer: Self-pay | Admitting: General Surgery

## 2012-03-19 VITALS — BP 148/78 | HR 72 | Temp 97.6°F | Resp 16 | Ht 64.0 in | Wt 128.2 lb

## 2012-03-19 DIAGNOSIS — C50911 Malignant neoplasm of unspecified site of right female breast: Secondary | ICD-10-CM

## 2012-03-19 DIAGNOSIS — C50211 Malignant neoplasm of upper-inner quadrant of right female breast: Secondary | ICD-10-CM | POA: Insufficient documentation

## 2012-03-19 DIAGNOSIS — C50219 Malignant neoplasm of upper-inner quadrant of unspecified female breast: Secondary | ICD-10-CM

## 2012-03-19 NOTE — Progress Notes (Signed)
Patient ID: Sarah [name], female   DOB: 1932-12-25, 78 y.o.   MRN: [id number]  Chief Complaint  Patient presents with  . Pre-op Exam    eval rt br ca - new    HPI Sarah [name] is a 78 y.o. female.  She is referred by Dr. Jeralyn Ruths for evaluation of ductal carcinoma in situ of the right breast, upper inner quadrant.  This patient states that she had a left breast biopsy by Dr. Kendrick Ranch 30 years ago for benign disease. She's had no other breast problems. She says she also  always has to get numerous x-rays because her breasts are dense. Recent mammogram and also ultrasound were performed for a 6 month followup. There were some calcifications and a small mass in the right breast at the 1:00 position. The mass is described as being 5 mm. Calcifications are described as tracking posteriorly.  Image guided biopsy showed ductal carcinoma in situ right breast this appears low to intermediate grade. Breast diagnostic profile is pending.The patient is anxious about her dense breast and asks if she needs an MRI.  She is here with her daughter. Family history is negative for breast or ovarian cancer. The patient is generally healthy. She has hypertension, hypothyroidism, had a total knee replacement and is being followed by Dr. Dulce Sellar for benign pancreatic cysts. HPI  Past Medical History  Diagnosis Date  . PONV (postoperative nausea and vomiting)   . Hypertension   . Hypothyroidism   . Hyperlipemia   . Arthritis   . Cancer     breast    Past Surgical History  Procedure Date  . Abdominal hysterectomy   . Tonsillectomy   . Breast surgery     lumpectomy  . Eye surgery     bilateral cataract removal  . Total knee arthroplasty 08/16/2011    Procedure: TOTAL KNEE ARTHROPLASTY;  Surgeon: Loreta Ave, MD;  Location: [facility] OR;  Service: Orthopedics;  Laterality: Right;  . Joint replacement     x2    Family History  Problem Relation Age of Onset  . Heart disease Father   .  Cancer Maternal Aunt     stomach    Social History History  Substance Use Topics  . Smoking status: Never Smoker   . Smokeless tobacco: Not on file  . Alcohol Use: No    Allergies  Allergen Reactions  . Codeine Other (See Comments)    Unknown reaction  . Thiazide-Type Diuretics Other (See Comments)    Blurry vision    Current Outpatient Prescriptions  Medication Sig Dispense Refill  . amLODipine (NORVASC) 5 MG tablet Take 5 mg by mouth 2 (two) times daily.      Marland Kitchen levothyroxine (SYNTHROID, LEVOTHROID) 50 MCG tablet Take 50 mcg by mouth daily.      . simvastatin (ZOCOR) 10 MG tablet Take 10 mg by mouth at bedtime.      Marland Kitchen zolpidem (AMBIEN) 5 MG tablet Take 5 mg by mouth at bedtime as needed.        Review of Systems Review of Systems  Constitutional: Negative for fever, chills and unexpected weight change.  HENT: Negative for hearing loss, congestion, sore throat, trouble swallowing and voice change.   Eyes: Negative for visual disturbance.  Respiratory: Negative for cough and wheezing.   Cardiovascular: Negative for chest pain, palpitations and leg swelling.  Gastrointestinal: Negative for nausea, vomiting, abdominal pain, diarrhea, constipation, blood in stool, abdominal distention and anal bleeding.  Genitourinary:  Negative for hematuria, vaginal bleeding and difficulty urinating.  Musculoskeletal: Negative for arthralgias.  Skin: Negative for rash and wound.  Neurological: Negative for seizures, syncope and headaches.  Hematological: Negative for adenopathy. Does not bruise/bleed easily.  Psychiatric/Behavioral: Negative for confusion.    Blood pressure 148/78, pulse 72, temperature 97.6 F (36.4 C), temperature source Temporal, resp. rate 16, height 5\' 4"  (1.626 m), weight 128 lb 3.2 oz (58.151 kg).  Physical Exam Physical Exam  Constitutional: She is oriented to person, place, and time. She appears well-developed and well-nourished. No distress.       Healthy  appearing, pleasant, younger than her stated age., Thin.  HENT:  Head: Normocephalic and atraumatic.  Nose: Nose normal.  Mouth/Throat: No oropharyngeal exudate.  Eyes: Conjunctivae normal and EOM are normal. Pupils are equal, round, and reactive to light. Left eye exhibits no discharge. No scleral icterus.  Neck: Neck supple. No JVD present. No tracheal deviation present. No thyromegaly present.  Cardiovascular: Normal rate, regular rhythm, normal heart sounds and intact distal pulses.   No murmur heard. Pulmonary/Chest: Effort normal and breath sounds normal. No respiratory distress. She has no wheezes. She has no rales. She exhibits no tenderness.       Breasts are medium to medium large in size. Bruise right breast upper inner quadrant. No palpable mass on either side. No other skin changes. No axillary adenopathy.  Abdominal: Soft. Bowel sounds are normal. She exhibits no distension and no mass. There is no tenderness. There is no rebound and no guarding.  Musculoskeletal: She exhibits no edema and no tenderness.  Lymphadenopathy:    She has no cervical adenopathy.  Neurological: She is alert and oriented to person, place, and time. She exhibits normal muscle tone. Coordination normal.  Skin: Skin is warm. No rash noted. She is not diaphoretic. No erythema. No pallor.  Psychiatric: She has a normal mood and affect. Her behavior is normal. Judgment and thought content normal.    Data Reviewed Mammograms, ultrasound, and biopsied pathology report.  Assessment    Ductal carcinoma in situ right breast, upper inner quadrant, low to intermediate grade, breast diagnostic protocol pending  Moderately dense breasts  Hypertension  Hypothyroidism  Benign pancreatic cyst, followed by Dr. Dulce Sellar    Plan    MRI of the breast will be performed because this is pure DCIS and breasts are dense  She will return to see me next week after the MRI is performed for further discussion.  We did  talk about lumpectomy, mastectomy, adjuvant radiation therapy, hormone receptor assay that is pending, involvement of medical oncology and radiation oncology, and a general team approach. She seems to be interested in exploring all of her options, but is interested in breast conservation if possible.  I did offer to refer her to medical oncology for preoperative consultation, and she decided to wait until the MRI was done to see whether she has a solitary finding or whether there is a more complex anatomical issue.  I'll see her next week after the MRI.       Angelia Mould. Derrell Lolling, M.D., [facility] General and Minimally invasive Surgery Breast and Colorectal Surgery Office:   [phone] Pager:   [phone]  03/19/2012, 3:11 PM

## 2012-03-19 NOTE — Patient Instructions (Addendum)
Your recent image guided biopsy of the left breast shows ductal carcinoma in situ. Hopefully this is a localized finding. Because of the density of her breast she will be scheduled for an MRI of the breast and then you will return to see Dr. Derrell Lolling for further discussion and planning.  You will eventually need an operation on the left breast, and we will also involve other breast cancer physicians in your care.     Breast Cancer, Early Stage, Surgery Choices Breast cancer is the most common cancer, except for skin cancer. It is the second most common cause of death, except for lung cancer, in women. The risk of a woman developing breast cancer is 12.5%. Breast cancer in women younger than 76 years old is rare. Most of these cancer cases have spread to the breast from another part of the body (metastatic). Finding out that you have breast cancer is an emotional shock and is difficult to understand, because it is an overwhelming, serious, and life-threatening disease. You will need to make important decisions about how you want it treated.  As a woman with early-stage breast cancer (DCIS or Stage I, IIA, IIB, IIIA, or IV) you may be able to choose which type of breast surgery to have. Your caregiver will help you understand what stage you are in. Often, your choices are:  Removal of the cancerous part(s) of the breast (breast-sparing surgery).  Lumpectomy.  Partial/Segmental mastectomy.  Removal of the whole breast (simple mastectomy). Research shows that women with early-stage breast cancer who have breast-sparing surgery along with radiation therapy live as long as those who have a mastectomy. Most women with breast cancer will lead long, healthy lives after treatment.  Reconstructive surgery. This type of surgery is to make the breast and nipple area as normal looking as it was before the mastectomy. It is usually done by a plastic surgeon at the same time as the mastectomy. There are several  types of reconstructive surgery that should be discussed with your caregiver and plastic surgeon.  Lymph node surgery.  Sentinel (removing those lymph nodes in the armpit that breast cancer spreads to first).  Axillary lymph node dissection (removing all the lymph nodes in the armpit). TREATMENT Treatment for breast cancer usually begins a few weeks after diagnosis. In these weeks, you should meet with a surgeon, learn the facts about your surgery choices, treatment options, get a second opinion, and think about what is important to you.  Treatment choices include:  Surgery.  Radiation.  Chemotherapy.  Medicines, hormones.  Reconstructive breast surgery.  Any combination of the above treatments. Choose which kind of surgery to have. If radiation, chemotherapy, or hormone therapy is considered, this also should be discussed before the surgery. Radical breast surgery is rarely performed now. Usually, lumpectomy, partial/segmental mastectomy, simple mastectomy in combination with radiation, chemotherapy, and/or hormone treatment is recommended. Clinical trial protocols (specific treatment plan with surgery and radiation, chemotherapy, and hormones) for breast cancer have been put in place in hospitals, universities, medical schools, and cancer centers all over the country. These patients are followed for several years to find out what treatment gives the best results for the protocol given, for the different stages of breast cancer.  Most women want to make this choice with help from their caregiver. After all, the kind of surgery you have will affect how you look and feel. But it is often hard to decide what to do. The more information you have, the better the decision  you can make.  Talk to a surgeon about your breast cancer surgery choices. Find out what happens during surgery, types of problems that sometimes occur, and other kinds of treatment (if any) you will need after surgery. Be sure  to ask a lot of questions and learn as much as you can. You may also wish to talk with family members, friends, or others who have had breast cancer surgery, radiation therapy, chemotherapy, or hormone therapy.  After talking with a surgeon, you may want a second opinion. This means talking with another doctor or surgeon who might tell you about other treatment options. They may simply give you information that can help you feel better about the choice you are making. Do not worry about hurting your surgeon's feelings. It is common practice to get a second opinion, and some insurance companies require it. Plus, it is better to get a second opinion than to worry that you have made the wrong choice. STAGES OF BREAST CANCER Staging of breast cancer depends on the size of the tumor, if and how far it has spread, lymph node involvement in the armpits, and whether it has spread to other parts of the body. If you are unsure of the stage of your cancer, ask your caregiver. The following are the stages of breast cancer:  Stage 0: This means you either have DCIS or LCIS.  DCIS (Ductal Carcinoma in Situ) is very early breast cancer. It is often too small to form a lump. Your caregiver may refer to DCIS as noninvasive cancer.  LCIS (Lobular Carcinoma in Situ) is not cancer, but it may increase the chance that you will get breast cancer. Talk with your caregiver about treatment options, if you are diagnosed with LCIS.  The 5 year survival rate in this stage is almost 100%.  Stage I:  Your cancer is less than 1 inch across (2 centimeters) or about the size of a quarter. The cancer is only in the breast. It has not spread to lymph nodes or other parts of your body.  Stage IIA:  No cancer is found in your breast. However, cancer is found in the lymph nodes under your arm, or  Your cancer is 1 inch (2 centimeters) or smaller. It has spread to 3 lymph nodes or less (the lymph nodes under your arm), or  Your  cancer is about 1-2 inches (2-5 centimeters). It has not spread to the lymph nodes under your arm.  Stage IIB:  Your cancer is about 1-2 inches (2-5 centimeters). It has spread to the lymph nodes under your arm, or  Your cancer is larger than 2 inches (5 centimeters). It has not spread to the lymph nodes under your arm.  The 5 year survival rate is 85 to 90%.  Stage IIIA:  No cancer is found in the breast. It is found in lymph nodes under your arm. The lymph nodes are attached to each other, or  Your cancer is 2 inches (5 centimeters) or smaller. It has spread to lymph nodes under your arm. The lymph nodes are attached to each other, or  Your cancer is larger than 2 inches (5 centimeters) and has spread to lymph nodes under your arm, and they are not attached to each other.  Your cancer is smaller than 2 inches (5 centimeters) and has spread to lymph nodes above your collarbone.  Inflammatory cancer of the breast (red rash, tender and swollen breasts) is in the stage III category.  The  5 year survival rate is 45 to 67%.  Stage IV:  Cancer cells have spread to other parts of the body.  The 5 year survival rate is about 20%. ABOUT LYMPH NODES  Lymph nodes are part of your body's immune system, which helps fight infection and disease. Lymph nodes are small, round, and clustered (like a bunch of grapes) throughout your body.  Axillary lymph nodes are in the area under your arm. Breast cancer may spread to these lymph nodes first, even when the tumor in the breast is small. This is why most surgeons take out some of these lymph nodes at the time of breast surgery.  Lymphedema is swelling caused by a buildup of lymph fluid. You may have this type of swelling in your arm, if your lymph nodes are taken out with surgery or damaged by radiation therapy.  Lymphedema can show up soon after surgery. The symptoms are often mild and last for a short time.  Lymphedema can show up months or  even years after cancer treatment is over. Often, lymphedema develops after an insect bite, minor injury, or burn on the arm where your lymph nodes were removed. Sometimes, this can be painful. One way to reduce the swelling is to work with a caregiver who specializes in rehabilitation, or with a physical therapist.  Sentinel lymph node biopsy is surgery to remove as few lymph nodes as possible from under the arm. The surgeon first injects a dye in the breast to see which lymph nodes the breast tumor drains into. Then, he or she removes these nodes to see if they have any cancer. If there is no cancer, the surgeon may leave the other lymph nodes in place. This surgery is fairly new and is being studied experimentally. Talk with your surgeon if you want to learn more. COMPARE YOUR CHOICES  BREAST-SPARING SURGERY  Is this surgery right for me?   Breast-sparing surgery with radiation is a safe choice for most women who have early-stage breast cancer. This means that your cancer is DCIS or at Stage I, IIA, IIB, or IIIA. What are the names of the different kinds of surgery?  Lumpectomy.  Partial mastectomy.  Breast-sparing surgery.  Segmental mastectomy.  Simple mastectomy.  Sentinel lymph node removal.  Axillary lymph node excision. What doctors am I likely to see?  Oncologist.  Surgeon.  Radiation Oncologist.  Chemotherapy Oncologist. What will my breast look like after surgery?  Your breast should look a lot like it did before surgery. But if your tumor is large, your breast may look different or smaller after breast-sparing surgery. Will I have feeling in the area around my breast?  Yes. You should still have feeling in your breast, nipple, and areola (dark area around your nipple). Will I have pain after the surgery?  You may have pain after surgery. Talk with your caregiver about ways to control this pain. What other problems can I expect?  You may feel very tired after  radiation therapy. You may get swelling in your arm (lymphedema). Will I need more surgery?  Maybe. You may need more surgery to remove lymph nodes from under your arm. Also, if the surgeon does not remove all your cancer the first time, you may need more surgery. What other types of treatment will I need?  You may need radiation therapy, given almost every day for 5 to 8 weeks. You may also need chemotherapy, hormone therapy, or both. Will insurance pay for my surgery?  Check with your insurance company to find out how much it pays for breast cancer surgery and other needed treatments. Will the type of surgery I have affect how long I live?  Women with early-stage breast cancer who have breast-sparing surgery followed by radiation live just as long as women who have a mastectomy. Most women with breast cancer will lead long, healthy lives after treatment. What are the chances that my cancer will come back after surgery?  About 10% of women who have breast-sparing surgery along with radiation therapy get cancer in the same breast within 12 years. If this happens, you will need a mastectomy, but it will not affect how long you live. MASTECTOMY SURGERY Is this surgery right for me?   Mastectomy is a safe choice for women who have early-stage breast cancer (DCIS, Stage I, IIA, IIB, or IIIA). You may need a mastectomy if:  You have small breasts and a large tumor.  You have cancer in more than one part of your breast.  The tumor is under the nipple.  You do not have access to radiation therapy. What are the names of the different kinds of surgery?  Total mastectomy.  Modified radical mastectomy (not usually done now).  Double mastectomy.  Simple mastectomy. What doctors am I likely to see?  Oncologist.  Surgeon.  Radiation Oncologist.  Chemotherapy Oncologist. What will my breast look like after surgery?  Your breast and nipple will be removed. You will have a flat chest  on the side of your body where the breast was removed. Will I have feeling in the area around my breast?  Maybe. After surgery, you will have no feeling (numb) in your chest wall and maybe also under your arm. This numb feeling should go away in 1-2 years, but it will never feel like it did before. Also, the skin where your breast was may feel tight. Will I have pain after the surgery?  You may have pain after surgery. Talk with your caregiver about ways to control this pain. What other problems can I expect?  You may have pain in your neck or back. You may feel out of balance, if you had large breasts and do not have reconstruction surgery. You may get swelling in your arm (lymphedema). Will I need more surgery?  Maybe. You may need surgery to remove lymph nodes from under your arm. Also, if you have problems after your mastectomy, you may need to see your surgeon for treatment. What other types of treatment will I need?  You may also need chemotherapy, hormone therapy, or radiation therapy. Some women get all 3 types of therapy or any combination of the 3. Will insurance pay for my surgery?  Check with your insurance company to find out how much it pays for breast cancer surgery and other needed treatments. Will the type of surgery I have affect how long I live?  Women with early-stage breast cancer who have a mastectomy live as long as women who have breast-sparing surgery followed by radiation therapy. Most women with breast cancer will lead long, healthy lives after treatment. What are the chances that my cancer will come back after surgery?  About 5% of women who have a mastectomy will get cancer on the same side of their chest within 12 years. MASTECTOMY AND BREAST RECONSTRUCTION SURGERY  Is this surgery right for me?  If you have a mastectomy, you might also want breast reconstruction surgery.  You can choose to have reconstruction  surgery at the same time as your  mastectomy.  You can wait and have it at a later date. What are the names of the different kinds of surgery?  Breast implant.  Tissue flap surgery.  Reconstructive plastic surgery. What doctors am I likely to see?  Oncologist.  Surgeon.  Radiation Oncologist.  Reconstructive Plastic Surgeon.  Chemotherapy Oncologist. What will my breast look like after surgery?  Although you will have a breast-like shape, your breast will not look the same as it did before surgery. Will I have feeling in the area around my breast?  No. The area around your breast will always be numb (have no feeling). Will I have pain after the surgery?  You are likely to have pain after major surgery, such as mastectomy and reconstruction surgery. There are many ways to deal with pain. Let your caregiver know if you need relief from pain. What other problems can I expect?  It may take you many weeks or even months to recover from breast reconstruction surgery. If you have an implant, you may get infections, pain, or hardness. Also, you may not like how your breast-like shape looks. You may need more surgery if your implant breaks or leaks. If you have tissue flap surgery, you may lose strength in the part of your body where the flap came from. You may get swelling in your arm (lymphedema). Will I need more surgery?  Yes. You will need surgery at least 2 more times to build a new breast-like shape. With implants, you may need more surgery months or years later. You may also need surgery to remove lymph nodes from under your arm. What other types of treatment will I need?  You may need chemotherapy, hormone therapy, or radiation therapy. Some women get all 3 types of therapy or any combination of the 3. Will insurance pay for my surgery?  Check with your insurance company to find out if it pays for breast reconstruction surgery. You should also ask if your insurance will pay for problems that may result from  breast reconstruction surgery. Will the type of surgery I have affect how long I live?  Women with early-stage breast cancer who have a mastectomy live the same amount of time as women who have breast-sparing surgery followed by radiation therapy. Most women with breast cancer will lead long, healthy lives after treatment. What are the chances that my cancer will come back after surgery?  About 5% of women who have a mastectomy will get cancer on the same side of their chest within 12 years. Breast reconstruction surgery does not affect the chances of your cancer coming back. Where can I learn more about coping with life after cancer? To learn more about life after cancer, you might want to read "Facing Forward: Life After Cancer Treatment." You can get this booklet at [employer] or by calling 1-800-4-CANCER. THINK ABOUT WHAT IS IMPORTANT TO YOU   After you have talked with your surgeon and learned the facts, you may also want to talk with your spouse or partner, family, friends, or other women who have had breast cancer surgery. The more you know about breast cancer, the treatment, and what happens afterward, the more informed you will be and the easier it will be for you to make good decisions that are important to you.  Think about what is important to you. Here are some questions to think about:  Do I want to get a second opinion?  How important  is it to me how my breast looks after cancer surgery?  How important is it to me how my breast feels after cancer surgery?  If I have breast-sparing surgery or more extensive surgery, am I willing to get radiation, hormone and/or chemotherapy?  If I have a mastectomy, do I also want breast reconstruction surgery?  If I have breast reconstruction surgery, do I want it at the same time as my mastectomy?  What treatment does my insurance cover, and what do I need to pay for?  Who would I like to talk with about my surgery,  radiation, hormone, and chemotherapy choices?  What else do I want to know, do, or learn before I make my choice about breast cancer surgery?  After I have learned all I could and have talked with my surgeon, I will make a choice that feels right for me.  A patient who takes the time to be well-informed will feel more comfortable about her decisions regarding surgery, and will feel better about the procedure following the surgery.  Coping and Support:  Be open and willing to talk to your family and friends about your cancer. Family and friends can be your best support group, to help you work through your concerns and worries.  Discussing your thoughts, concerns, and intimacy issues with your spouse or partner is very important and helpful.  Join support groups to share and learn how others with breast cancer cope and deal with their cancer. The [employer] can help you find support groups in your area.  Breast cancer may affect your confidence and feelings about being a total woman. It may interfere with your intimate relationship with your partner. Counseling may be necessary to help you overcome these feelings.  Talk to a counselor, your clergyperson, psychologist, or psychiatrist.  Talk to a medical social worker, if you have financial questions or problems.  Do not be afraid to ask for help, especially during your treatment.  Learn to be as independent as possible, as soon as possible. Document Released: 07/29/2003 Document Revised: 07/31/2011 Document Reviewed: 04/05/2009 [facility] Patient Information 2013 [place], [place].

## 2012-03-21 ENCOUNTER — Telehealth (INDEPENDENT_AMBULATORY_CARE_PROVIDER_SITE_OTHER): Payer: Self-pay | Admitting: General Surgery

## 2012-03-21 ENCOUNTER — Other Ambulatory Visit: Payer: Medicare Other

## 2012-03-21 NOTE — Telephone Encounter (Signed)
Called patient and left message to request call back. Patient was scheduled to see Dr. Derrell Lolling on 03/25/12 and it has been changed to 03/27/12 due to testing on 11/5/3 at the breast center. Patient to be seen by Dr. Derrell Lolling on 03/27/12 at 4:00 per his approval/request.

## 2012-03-22 ENCOUNTER — Telehealth (INDEPENDENT_AMBULATORY_CARE_PROVIDER_SITE_OTHER): Payer: Self-pay | Admitting: General Surgery

## 2012-03-22 ENCOUNTER — Other Ambulatory Visit: Payer: Medicare Other

## 2012-03-22 NOTE — Telephone Encounter (Signed)
Pt returned call to Annabelle Harman and was given the information for NP Breast appt on 03/27/12 with Dr. Derrell Lolling.

## 2012-03-25 ENCOUNTER — Encounter (INDEPENDENT_AMBULATORY_CARE_PROVIDER_SITE_OTHER): Payer: Medicare Other | Admitting: General Surgery

## 2012-03-26 ENCOUNTER — Ambulatory Visit
Admission: RE | Admit: 2012-03-26 | Discharge: 2012-03-26 | Disposition: A | Discharge status: Home / Self Care | Payer: Medicare Other | Source: Ambulatory Visit | Attending: General Surgery | Admitting: General Surgery

## 2012-03-26 DIAGNOSIS — D0590 Unspecified type of carcinoma in situ of unspecified breast: Secondary | ICD-10-CM | POA: Diagnosis not present

## 2012-03-26 MED ORDER — GADOBENATE DIMEGLUMINE 529 MG/ML IV SOLN
11.0000 mL | Freq: Once | INTRAVENOUS | Status: AC | PRN
  Administered 2012-03-26: 11 mL via INTRAVENOUS

## 2012-03-27 ENCOUNTER — Ambulatory Visit (INDEPENDENT_AMBULATORY_CARE_PROVIDER_SITE_OTHER): Payer: Medicare Other | Admitting: General Surgery

## 2012-03-27 ENCOUNTER — Encounter (INDEPENDENT_AMBULATORY_CARE_PROVIDER_SITE_OTHER): Payer: Self-pay | Admitting: General Surgery

## 2012-03-27 VITALS — BP 128/80 | HR 72 | Temp 97.8°F | Resp 16 | Ht 64.0 in | Wt 128.2 lb

## 2012-03-27 DIAGNOSIS — C50219 Malignant neoplasm of upper-inner quadrant of unspecified female breast: Secondary | ICD-10-CM

## 2012-03-27 NOTE — Progress Notes (Signed)
Patient ID: Sarah [name], female   DOB: 1933/03/17, 78 y.o.   MRN: [id number]  Chief Complaint  Patient presents with  . Breast Cancer    HPI Sarah [name] is a 78 y.o. female.  She returns for further discussion regarding management options for treatment of her noninvasive cancer of the right breast, upper inner quadrant.  Her recent image guided biopsy shows low to intermediate grade DCIS of the right breast, upper inner quadrant. Breast diagnostic profile revealed strongly positive ER and PR. MRI was performed and this shows a 2.4 cm area of non-masslike, patchy enhancement in the right breast, upper inner quadrant which correlates to the known area of DCIS. It was felt that some of this enhancement is secondary to biopsy changes. The left breast looks normal. There were no enlarged lymph nodes. This appeared to be a solitary finding of the right breast.  She is here to discuss treatment plan. Her daughter is with her. We spent 30-45 minutes discussing this. We went over partial mastectomy with needle localization. We talked about adjuvant therapies include radiation therapy and antiestrogen therapy. We talked about mastectomy. She was offered referral to a medical oncologist and radiation oncologist for second opinion and she declined that. She seems most interested in breast conservation surgery which I think is most appropriate in this setting. HPI  Past Medical History  Diagnosis Date  . PONV (postoperative nausea and vomiting)   . Hypertension   . Hypothyroidism   . Hyperlipemia   . Arthritis   . Cancer     breast    Past Surgical History  Procedure Date  . Abdominal hysterectomy   . Tonsillectomy   . Breast surgery     lumpectomy  . Eye surgery     bilateral cataract removal  . Total knee arthroplasty 08/16/2011    Procedure: TOTAL KNEE ARTHROPLASTY;  Surgeon: Loreta Ave, MD;  Location: [facility] OR;  Service: Orthopedics;  Laterality: Right;  . Joint replacement    x2    Family History  Problem Relation Age of Onset  . Heart disease Father   . Cancer Maternal Aunt     stomach    Social History History  Substance Use Topics  . Smoking status: Never Smoker   . Smokeless tobacco: Not on file  . Alcohol Use: No    Allergies  Allergen Reactions  . Codeine Other (See Comments)    Unknown reaction  . Thiazide-Type Diuretics Other (See Comments)    Blurry vision    Current Outpatient Prescriptions  Medication Sig Dispense Refill  . aspirin 81 MG tablet Take 81 mg by mouth daily.      Marland Kitchen atorvastatin (LIPITOR) 10 MG tablet Daily.      . Cholecalciferol (D3-1000 PO) Take by mouth daily.      . Lutein 20 MG TABS Take by mouth daily.      . Misc Natural Products (GRAPE SEED COMPLEX PO) Take by mouth 2 (two) times daily.      . Multiple Vitamins-Minerals (PRESERVISION AREDS PO) Take by mouth daily.      Marland Kitchen amLODipine (NORVASC) 5 MG tablet Take 5 mg by mouth 2 (two) times daily.      Marland Kitchen levothyroxine (SYNTHROID, LEVOTHROID) 50 MCG tablet Take 50 mcg by mouth daily.      . simvastatin (ZOCOR) 10 MG tablet Take 10 mg by mouth at bedtime.      Marland Kitchen zolpidem (AMBIEN) 5 MG tablet Take 5 mg by mouth  at bedtime as needed.        Review of Systems Review of Systems  Constitutional: Negative for fever, chills and unexpected weight change.  HENT: Negative for hearing loss, congestion, sore throat, trouble swallowing and voice change.   Eyes: Negative for visual disturbance.  Respiratory: Negative for cough and wheezing.   Cardiovascular: Negative for chest pain, palpitations and leg swelling.  Gastrointestinal: Negative for nausea, vomiting, abdominal pain, diarrhea, constipation, blood in stool, abdominal distention and anal bleeding.  Genitourinary: Negative for hematuria, vaginal bleeding and difficulty urinating.  Musculoskeletal: Positive for arthralgias.  Skin: Negative for rash and wound.  Neurological: Negative for seizures, syncope and headaches.    Hematological: Negative for adenopathy. Does not bruise/bleed easily.  Psychiatric/Behavioral: Negative for confusion.    Blood pressure 128/80, pulse 72, temperature 97.8 F (36.6 C), temperature source Temporal, resp. rate 16, height 5\' 4"  (1.626 m), weight 128 lb 4 oz (58.174 kg).  Physical Exam Physical Exam  Constitutional: She is oriented to person, place, and time. She appears well-developed and well-nourished. No distress.  Neck: Neck supple. No JVD present. No tracheal deviation present. No thyromegaly present.  Cardiovascular: Normal rate, regular rhythm and normal heart sounds.   Pulmonary/Chest: Effort normal and breath sounds normal. No stridor. No respiratory distress. She has no wheezes. She has no rales. She exhibits no tenderness.  Musculoskeletal: She exhibits no edema and no tenderness.  Lymphadenopathy:    She has no cervical adenopathy.  Neurological: She is alert and oriented to person, place, and time.  Skin: Skin is warm and dry. No rash noted. She is not diaphoretic. No erythema. No pallor.  Psychiatric: She has a normal mood and affect. Her behavior is normal. Judgment and thought content normal.    Data Reviewed MRI. Breast diagnostic protocol.  Assessment    Ductal carcinoma in situ right breast, upper inner quadrant, light intermediate grade, with scepter positive.  Patient desires breast conservation surgery  Hypertension  Hypothyroidism  Benign pancreatic cyst, followed by Dr. Dulce Sellar    Plan    Scheduled for right partial mastectomy with needle localization.  She will be referred to medical oncology and radiation oncology postop.  We talked about the possibility of second surgery for positive margins, invasive disease, or cosmetic issues;   infection and bleeding and numerous other risks. She understands these issues. All of her questions are answered. She understands the techniques of surgery. She agrees with this plan.       Angelia Mould. Derrell Lolling, M.D., [facility] General and Minimally invasive Surgery Breast and Colorectal Surgery Office:   [phone] Pager:   [phone]  03/27/2012, 4:45 PM

## 2012-03-27 NOTE — Patient Instructions (Signed)
Your MRI shows a solitary area of enhancement in the upper inner quadrant of the right breast. This correlates very well with the known area of noninvasive breast cancer. All of the lymph nodes looked normal. The left breast looks normal.  We have discussed the approach for treating your breast cancer and the options that you would have.  You will be scheduled for right partial mastectomy with needle localization in the near future.  You will be referred to medical oncology and radiation oncology postoperatively.     Lumpectomy, Breast Conserving Surgery A lumpectomy is breast surgery that removes only part of the breast. Another name used may be partial mastectomy. The amount removed varies. Make sure you understand how much of your breast will be removed. Reasons for a lumpectomy:  Any solid breast mass.  Grouped significant nodularity that may be confused with a solitary breast mass. Lumpectomy is the most common form of breast cancer surgery today. The surgeon removes the portion of your breast which contains the tumor (cancer). This is the lump. Some normal tissue around the lump is also removed to be sure that all the tumor has been removed.  If cancer cells are found in the margins where the breast tissue was removed, your surgeon will do more surgery to remove the remaining cancer tissue. This is called re-excision surgery. Radiation and/or chemotherapy treatments are often given following a lumpectomy to kill any cancer cells that could possibly remain.  REASONS YOU MAY NOT BE ABLE TO HAVE BREAST CONSERVING SURGERY:  The tumor is located in more than one place.  Your breast is small and the tumor is large so the breast would be disfigured.  The entire tumor removal is not successful with a lumpectomy.  You cannot commit to a full course of chemotherapy, radiation therapy or are pregnant and cannot have radiation.  You have previously had radiation to the breast to treat  cancer. HOW A LUMPECTOMY IS PERFORMED If overnight nursing is not required following a biopsy, a lumpectomy can be performed as a same-day surgery. This can be done in a hospital, clinic, or surgical center. The anesthesia used will depend on your surgeon. They will discuss this with you. A general anesthetic keeps you sleeping through the procedure. LET YOUR CAREGIVERS KNOW ABOUT THE FOLLOWING:  Allergies  Medications taken including herbs, eye drops, over the counter medications, and creams.  Use of steroids (by mouth or creams)  Previous problems with anesthetics or Novocaine.  Possibility of pregnancy, if this applies  History of blood clots (thrombophlebitis)  History of bleeding or blood problems.  Previous surgery  Other health problems BEFORE THE PROCEDURE You should be present one hour prior to your procedure unless directed otherwise.  AFTER THE PROCEDURE  After surgery, you will be taken to the recovery area where a nurse will watch and check your progress. Once you're awake, stable, and taking fluids well, barring other problems you will be allowed to go home.  Ice packs applied to your operative site may help with discomfort and keep the swelling down.  A small rubber drain may be placed in the breast for a couple of days to prevent a hematoma from developing in the breast.  A pressure dressing may be applied for 24 to 48 hours to prevent bleeding.  Keep the wound dry.  You may resume a normal diet and activities as directed. Avoid strenuous activities affecting the arm on the side of the biopsy site such as tennis, swimming,  heavy lifting (more than 10 pounds) or pulling.  Bruising in the breast is normal following this procedure.  Wearing a bra - even to bed - may be more comfortable and also help keep the dressing on.  Change dressings as directed.  Only take over-the-counter or prescription medicines for pain, discomfort, or fever as directed by your  caregiver. Call for your results as instructed by your surgeon. Remember it is your responsibility to get the results of your lumpectomy if your surgeon asked you to follow-up. Do not assume everything is fine if you have not heard from your caregiver. SEEK MEDICAL CARE IF:   There is increased bleeding (more than a small spot) from the wound.  You notice redness, swelling, or increasing pain in the wound.  Pus is coming from wound.  An unexplained oral temperature above 102 F (38.9 C) develops.  You notice a foul smell coming from the wound or dressing. SEEK IMMEDIATE MEDICAL CARE IF:   You develop a rash.  You have difficulty breathing.  You have any allergic problems. Document Released: 06/19/2006 Document Revised: 07/31/2011 Document Reviewed: 09/20/2006 [facility] Patient Information 2013 [place], [place].

## 2012-03-28 ENCOUNTER — Telehealth (INDEPENDENT_AMBULATORY_CARE_PROVIDER_SITE_OTHER): Payer: Self-pay

## 2012-03-28 ENCOUNTER — Ambulatory Visit: Payer: Medicare Other | Attending: Geriatric Medicine | Admitting: Rehabilitative and Restorative Service Providers"

## 2012-03-28 DIAGNOSIS — H8110 Benign paroxysmal vertigo, unspecified ear: Secondary | ICD-10-CM | POA: Insufficient documentation

## 2012-03-28 DIAGNOSIS — IMO0001 Reserved for inherently not codable concepts without codable children: Secondary | ICD-10-CM | POA: Diagnosis not present

## 2012-03-28 DIAGNOSIS — R269 Unspecified abnormalities of gait and mobility: Secondary | ICD-10-CM | POA: Diagnosis not present

## 2012-03-28 NOTE — Telephone Encounter (Signed)
Msg to  Call Annabelle Harman to give  Patient  their appt for Dr. Derrell Lolling on 04/25/2012 @ 12:15

## 2012-04-03 ENCOUNTER — Encounter (HOSPITAL_BASED_OUTPATIENT_CLINIC_OR_DEPARTMENT_OTHER): Payer: Self-pay | Admitting: *Deleted

## 2012-04-03 NOTE — Progress Notes (Signed)
To come in for [facility] labs and [facility]

## 2012-04-05 ENCOUNTER — Encounter (HOSPITAL_BASED_OUTPATIENT_CLINIC_OR_DEPARTMENT_OTHER)
Admission: RE | Admit: 2012-04-05 | Discharge: 2012-04-05 | Disposition: A | Discharge status: Home / Self Care | Payer: Medicare Other | Source: Ambulatory Visit | Attending: General Surgery | Admitting: General Surgery

## 2012-04-05 DIAGNOSIS — Z170 Estrogen receptor positive status [ER+]: Secondary | ICD-10-CM | POA: Diagnosis not present

## 2012-04-05 DIAGNOSIS — I10 Essential (primary) hypertension: Secondary | ICD-10-CM | POA: Diagnosis not present

## 2012-04-05 DIAGNOSIS — Z7982 Long term (current) use of aspirin: Secondary | ICD-10-CM | POA: Diagnosis not present

## 2012-04-05 DIAGNOSIS — Z96659 Presence of unspecified artificial knee joint: Secondary | ICD-10-CM | POA: Diagnosis not present

## 2012-04-05 DIAGNOSIS — Z90710 Acquired absence of both cervix and uterus: Secondary | ICD-10-CM | POA: Diagnosis not present

## 2012-04-05 DIAGNOSIS — E039 Hypothyroidism, unspecified: Secondary | ICD-10-CM | POA: Diagnosis not present

## 2012-04-05 DIAGNOSIS — E785 Hyperlipidemia, unspecified: Secondary | ICD-10-CM | POA: Diagnosis not present

## 2012-04-05 DIAGNOSIS — D0590 Unspecified type of carcinoma in situ of unspecified breast: Secondary | ICD-10-CM | POA: Diagnosis not present

## 2012-04-05 DIAGNOSIS — Z01812 Encounter for preprocedural laboratory examination: Secondary | ICD-10-CM | POA: Diagnosis not present

## 2012-04-05 DIAGNOSIS — Z79899 Other long term (current) drug therapy: Secondary | ICD-10-CM | POA: Diagnosis not present

## 2012-04-05 LAB — CBC WITH DIFFERENTIAL/PLATELET
Basophils Relative: 1 % (ref 0–1)
Eosinophils Absolute: 0.1 10*3/uL (ref 0.0–0.7)
Eosinophils Relative: 2 % (ref 0–5)
Hemoglobin: 15.8 g/dL — ABNORMAL HIGH (ref 12.0–15.0)
Lymphs Abs: 1.4 10*3/uL (ref 0.7–4.0)
MCH: 30.5 pg (ref 26.0–34.0)
MCHC: 34.6 g/dL (ref 30.0–36.0)
MCV: 88 fL (ref 78.0–100.0)
Monocytes Relative: 9 % (ref 3–12)
Neutrophils Relative %: 59 % (ref 43–77)
Platelets: 174 10*3/uL (ref 150–400)
RBC: 5.18 MIL/uL — ABNORMAL HIGH (ref 3.87–5.11)

## 2012-04-05 LAB — COMPREHENSIVE METABOLIC PANEL
Albumin: 4.2 g/dL (ref 3.5–5.2)
BUN: 19 mg/dL (ref 6–23)
Calcium: 10.4 mg/dL (ref 8.4–10.5)
GFR calc Af Amer: >90 mL/min (ref >90)
Glucose, Bld: 112 mg/dL — ABNORMAL HIGH (ref 70–99)
Total Protein: 7.8 g/dL (ref 6.0–8.3)

## 2012-04-05 NOTE — H&P (Signed)
Sarah [name]    MRN: [id number]   Description: 78 year old female  Provider: Ernestene Mention, MD  Department: [facility]       Diagnoses     Cancer of upper-inner quadrant of female breast   - Primary    174.2        Vitals    BP Pulse Temp Resp Ht Wt    128/80 72 97.8 F (36.6 C) (Temporal) 16 5\' 4"  (1.626 m) 128 lb 4 oz (58.174 kg)   BMI - 22.01 kg/m2                     History and Physical    Ernestene Mention, MD   Patient ID: Sarah [name], female   DOB: [birth year], 78 y.o.   MRN: [id number]              HPI [name] is a 78 y.o. female.  She returns for further discussion regarding management options for treatment of her noninvasive cancer of the right breast, upper inner quadrant.   Her recent image guided biopsy shows low to intermediate grade DCIS of the right breast, upper inner quadrant. Breast diagnostic profile revealed strongly positive ER and PR. MRI was performed and this shows a 2.4 cm area of non-masslike, patchy enhancement in the right breast, upper inner quadrant which correlates to the known area of DCIS. It was felt that some of this enhancement is secondary to biopsy changes. The left breast looks normal. There were no enlarged lymph nodes. This appeared to be a solitary finding of the right breast.   She is here to discuss treatment plan. Her daughter is with her. We spent 30-45 minutes discussing this. We went over partial mastectomy with needle localization. We talked about adjuvant therapies include radiation therapy and antiestrogen therapy. We talked about mastectomy. She was offered referral to a medical oncologist and radiation oncologist for second opinion and she declined that. She seems most interested in breast conservation surgery which I think is most appropriate in this setting.       Past Medical History   Diagnosis  Date   .  PONV (postoperative nausea and vomiting)     .  Hypertension     .   Hypothyroidism     .  Hyperlipemia     .  Arthritis     .  Cancer         breast       Past Surgical History   Procedure  Date   .  Abdominal hysterectomy     .  Tonsillectomy     .  Breast surgery         lumpectomy   .  Eye surgery         bilateral cataract removal   .  Total knee arthroplasty  08/16/2011       Procedure: TOTAL KNEE ARTHROPLASTY;  Surgeon: Loreta Ave, MD;  Location: [facility] OR;  Service: Orthopedics;  Laterality: Right;   .  Joint replacement         x2       Family History   Problem  Relation  Age of Onset   .  Heart disease  Father     .  Cancer  Maternal Aunt         stomach      Social History History   Substance Use Topics   .  Smoking  status:  Never Smoker    .  Smokeless tobacco:  Not on file   .  Alcohol Use:  No       Allergies   Allergen  Reactions   .  Codeine  Other (See Comments)       Unknown reaction   .  Thiazide-Type Diuretics  Other (See Comments)       Blurry vision       Current Outpatient Prescriptions   Medication  Sig  Dispense  Refill   .  aspirin 81 MG tablet  Take 81 mg by mouth daily.         Marland Kitchen  atorvastatin (LIPITOR) 10 MG tablet  Daily.         .  Cholecalciferol (D3-1000 PO)  Take by mouth daily.         .  Lutein 20 MG TABS  Take by mouth daily.         .  Misc Natural Products (GRAPE SEED COMPLEX PO)  Take by mouth 2 (two) times daily.         .  Multiple Vitamins-Minerals (PRESERVISION AREDS PO)  Take by mouth daily.         Marland Kitchen  amLODipine (NORVASC) 5 MG tablet  Take 5 mg by mouth 2 (two) times daily.         Marland Kitchen  levothyroxine (SYNTHROID, LEVOTHROID) 50 MCG tablet  Take 50 mcg by mouth daily.         .  simvastatin (ZOCOR) 10 MG tablet  Take 10 mg by mouth at bedtime.         Marland Kitchen  zolpidem (AMBIEN) 5 MG tablet  Take 5 mg by mouth at bedtime as needed.            Review of Systems   Constitutional: Negative for fever, chills and unexpected weight change.  HENT: Negative for hearing loss, congestion, sore  throat, trouble swallowing and voice change.   Eyes: Negative for visual disturbance.  Respiratory: Negative for cough and wheezing.   Cardiovascular: Negative for chest pain, palpitations and leg swelling.  Gastrointestinal: Negative for nausea, vomiting, abdominal pain, diarrhea, constipation, blood in stool, abdominal distention and anal bleeding.  Genitourinary: Negative for hematuria, vaginal bleeding and difficulty urinating.  Musculoskeletal: Positive for arthralgias.  Skin: Negative for rash and wound.  Neurological: Negative for seizures, syncope and headaches.  Hematological: Negative for adenopathy. Does not bruise/bleed easily.  Psychiatric/Behavioral: Negative for confusion.    Blood pressure 128/80, pulse 72, temperature 97.8 F (36.6 C), temperature source Temporal, resp. rate 16, height 5\' 4"  (1.626 m), weight 128 lb 4 oz (58.174 kg).   Physical Exam   Constitutional: She is oriented to person, place, and time. She appears well-developed and well-nourished. No distress.  Neck: Neck supple. No JVD present. No tracheal deviation present. No thyromegaly present.  Cardiovascular: Normal rate, regular rhythm and normal heart sounds.   Pulmonary/Chest: Effort normal and breath sounds normal. No stridor. No respiratory distress. She has no wheezes. She has no rales. She exhibits no tenderness.  Musculoskeletal: She exhibits no edema and no tenderness.  Lymphadenopathy:    She has no cervical adenopathy.  Neurological: She is alert and oriented to person, place, and time.  Skin: Skin is warm and dry. No rash noted. She is not diaphoretic. No erythema. No pallor.  Psychiatric: She has a normal mood and affect. Her behavior is normal. Judgment and thought content normal.    Data Reviewed  MRI. Breast diagnostic protocol.   Assessment Ductal carcinoma in situ right breast, upper inner quadrant, light intermediate grade, with scepter positive.   Patient desires breast  conservation surgery   Hypertension   Hypothyroidism   Benign pancreatic cyst, followed by Dr. Dulce Sellar   Plan Scheduled for right partial mastectomy with needle localization.   She will be referred to medical oncology and radiation oncology postop.   We talked about the possibility of second surgery for positive margins, invasive disease, or cosmetic issues;   infection and bleeding and numerous other risks. She understands these issues. All of her questions are answered. She understands the techniques of surgery. She agrees with this plan.       Angelia Mould. Derrell Lolling, M.D., [facility] General and Minimally invasive Surgery Breast and Colorectal Surgery Office:   [phone] Pager:   [phone]

## 2012-04-09 ENCOUNTER — Encounter (HOSPITAL_BASED_OUTPATIENT_CLINIC_OR_DEPARTMENT_OTHER)
Admission: RE | Disposition: A | Discharge status: Home / Self Care | Payer: Self-pay | Source: Ambulatory Visit | Attending: General Surgery

## 2012-04-09 ENCOUNTER — Encounter (HOSPITAL_BASED_OUTPATIENT_CLINIC_OR_DEPARTMENT_OTHER): Payer: Self-pay

## 2012-04-09 ENCOUNTER — Encounter (HOSPITAL_BASED_OUTPATIENT_CLINIC_OR_DEPARTMENT_OTHER): Payer: Self-pay | Admitting: Anesthesiology

## 2012-04-09 ENCOUNTER — Ambulatory Visit (HOSPITAL_BASED_OUTPATIENT_CLINIC_OR_DEPARTMENT_OTHER): Payer: Medicare Other

## 2012-04-09 ENCOUNTER — Ambulatory Visit (HOSPITAL_BASED_OUTPATIENT_CLINIC_OR_DEPARTMENT_OTHER)
Admission: RE | Admit: 2012-04-09 | Discharge: 2012-04-09 | Disposition: A | Discharge status: Home / Self Care | Payer: Medicare Other | Source: Ambulatory Visit | Attending: General Surgery | Admitting: General Surgery

## 2012-04-09 DIAGNOSIS — E039 Hypothyroidism, unspecified: Secondary | ICD-10-CM | POA: Diagnosis not present

## 2012-04-09 DIAGNOSIS — C50919 Malignant neoplasm of unspecified site of unspecified female breast: Secondary | ICD-10-CM | POA: Diagnosis not present

## 2012-04-09 DIAGNOSIS — I10 Essential (primary) hypertension: Secondary | ICD-10-CM | POA: Diagnosis not present

## 2012-04-09 DIAGNOSIS — Z170 Estrogen receptor positive status [ER+]: Secondary | ICD-10-CM | POA: Insufficient documentation

## 2012-04-09 DIAGNOSIS — Z90710 Acquired absence of both cervix and uterus: Secondary | ICD-10-CM | POA: Insufficient documentation

## 2012-04-09 DIAGNOSIS — E785 Hyperlipidemia, unspecified: Secondary | ICD-10-CM | POA: Insufficient documentation

## 2012-04-09 DIAGNOSIS — Z0189 Encounter for other specified special examinations: Secondary | ICD-10-CM | POA: Diagnosis not present

## 2012-04-09 DIAGNOSIS — D0590 Unspecified type of carcinoma in situ of unspecified breast: Secondary | ICD-10-CM | POA: Insufficient documentation

## 2012-04-09 DIAGNOSIS — Z96659 Presence of unspecified artificial knee joint: Secondary | ICD-10-CM | POA: Insufficient documentation

## 2012-04-09 DIAGNOSIS — Z01812 Encounter for preprocedural laboratory examination: Secondary | ICD-10-CM | POA: Insufficient documentation

## 2012-04-09 DIAGNOSIS — Z7982 Long term (current) use of aspirin: Secondary | ICD-10-CM | POA: Insufficient documentation

## 2012-04-09 DIAGNOSIS — Z79899 Other long term (current) drug therapy: Secondary | ICD-10-CM | POA: Insufficient documentation

## 2012-04-09 DIAGNOSIS — C50211 Malignant neoplasm of upper-inner quadrant of right female breast: Secondary | ICD-10-CM | POA: Diagnosis present

## 2012-04-09 HISTORY — DX: Presence of dental prosthetic device (complete) (partial): Z97.2

## 2012-04-09 HISTORY — DX: Presence of spectacles and contact lenses: Z97.3

## 2012-04-09 HISTORY — DX: Unspecified hearing loss, unspecified ear: H91.90

## 2012-04-09 HISTORY — PX: PARTIAL MASTECTOMY WITH NEEDLE LOCALIZATION: SHX6008

## 2012-04-09 LAB — URINALYSIS, ROUTINE W REFLEX MICROSCOPIC
Glucose, UA: NEGATIVE mg/dL
Nitrite: NEGATIVE
Specific Gravity, Urine: 1.011 (ref 1.005–1.030)
pH: 7 (ref 5.0–8.0)

## 2012-04-09 LAB — URINE MICROSCOPIC-ADD ON

## 2012-04-09 SURGERY — PARTIAL MASTECTOMY WITH NEEDLE LOCALIZATION
Anesthesia: General | Site: Breast | Laterality: Right | Wound class: Clean

## 2012-04-09 MED ORDER — MORPHINE SULFATE 2 MG/ML IJ SOLN: 2.0000 mg | INTRAMUSCULAR | Status: DC | PRN

## 2012-04-09 MED ORDER — HEPARIN SODIUM (PORCINE) 5000 UNIT/ML IJ SOLN
5000.0000 [IU] | Freq: Once | INTRAMUSCULAR | Status: AC
  Administered 2012-04-09: 5000 [IU] via SUBCUTANEOUS

## 2012-04-09 MED ORDER — CHLORHEXIDINE GLUCONATE 4 % EX LIQD: 1.0000 "application " | Freq: Once | CUTANEOUS | Status: DC

## 2012-04-09 MED ORDER — SODIUM CHLORIDE 0.9 % IJ SOLN: 3.0000 mL | Freq: Two times a day (BID) | INTRAMUSCULAR | Status: DC

## 2012-04-09 MED ORDER — PROPOFOL 10 MG/ML IV BOLUS
INTRAVENOUS | Status: DC | PRN
  Administered 2012-04-09: 200 mg via INTRAVENOUS

## 2012-04-09 MED ORDER — FENTANYL CITRATE 0.05 MG/ML IJ SOLN
INTRAMUSCULAR | Status: DC | PRN
  Administered 2012-04-09 (×2): 50 ug via INTRAVENOUS

## 2012-04-09 MED ORDER — ONDANSETRON HCL 4 MG/2ML IJ SOLN: INTRAMUSCULAR | Status: DC | PRN

## 2012-04-09 MED ORDER — OXYCODONE HCL 5 MG/5ML PO SOLN: 5.0000 mg | Freq: Once | ORAL | Status: DC | PRN

## 2012-04-09 MED ORDER — SODIUM CHLORIDE 0.9 % IV SOLN: INTRAVENOUS | Status: DC

## 2012-04-09 MED ORDER — BUPIVACAINE-EPINEPHRINE 0.5% -1:200000 IJ SOLN
INTRAMUSCULAR | Status: DC | PRN
  Administered 2012-04-09: 10 mL

## 2012-04-09 MED ORDER — SODIUM CHLORIDE 0.9 % IV SOLN: 250.0000 mL | INTRAVENOUS | Status: DC | PRN

## 2012-04-09 MED ORDER — ACETAMINOPHEN 650 MG RE SUPP: 650.0000 mg | RECTAL | Status: DC | PRN

## 2012-04-09 MED ORDER — CEFAZOLIN SODIUM-DEXTROSE 2-3 GM-% IV SOLR
2.0000 g | INTRAVENOUS | Status: AC
  Administered 2012-04-09: 2 g via INTRAVENOUS

## 2012-04-09 MED ORDER — HYDROMORPHONE HCL PF 1 MG/ML IJ SOLN
0.2500 mg | INTRAMUSCULAR | Status: DC | PRN
  Administered 2012-04-09 (×2): 0.5 mg via INTRAVENOUS

## 2012-04-09 MED ORDER — DEXAMETHASONE SODIUM PHOSPHATE 4 MG/ML IJ SOLN
INTRAMUSCULAR | Status: DC | PRN
  Administered 2012-04-09: 8 mg via INTRAVENOUS

## 2012-04-09 MED ORDER — OXYCODONE HCL 5 MG PO TABS: 5.0000 mg | ORAL_TABLET | ORAL | Status: DC | PRN

## 2012-04-09 MED ORDER — LACTATED RINGERS IV SOLN
INTRAVENOUS | Status: DC
  Administered 2012-04-09 (×2): via INTRAVENOUS

## 2012-04-09 MED ORDER — OXYCODONE HCL 5 MG PO TABS: 5.0000 mg | ORAL_TABLET | Freq: Once | ORAL | Status: DC | PRN

## 2012-04-09 MED ORDER — ONDANSETRON HCL 4 MG/2ML IJ SOLN: 4.0000 mg | Freq: Once | INTRAMUSCULAR | Status: DC | PRN

## 2012-04-09 MED ORDER — ONDANSETRON HCL 4 MG/2ML IJ SOLN
INTRAMUSCULAR | Status: DC | PRN
  Administered 2012-04-09: 4 mg via INTRAVENOUS

## 2012-04-09 MED ORDER — SODIUM CHLORIDE 0.9 % IJ SOLN: 3.0000 mL | INTRAMUSCULAR | Status: DC | PRN

## 2012-04-09 MED ORDER — ACETAMINOPHEN 325 MG PO TABS: 650.0000 mg | ORAL_TABLET | ORAL | Status: DC | PRN

## 2012-04-09 MED ORDER — MIDAZOLAM HCL 5 MG/5ML IJ SOLN
INTRAMUSCULAR | Status: DC | PRN
  Administered 2012-04-09: 2 mg via INTRAVENOUS

## 2012-04-09 MED ORDER — KETOROLAC TROMETHAMINE 30 MG/ML IJ SOLN
INTRAMUSCULAR | Status: DC | PRN
  Administered 2012-04-09: 30 mg via INTRAVENOUS

## 2012-04-09 MED ORDER — LIDOCAINE HCL (CARDIAC) 20 MG/ML IV SOLN
INTRAVENOUS | Status: DC | PRN
  Administered 2012-04-09: 75 mg via INTRAVENOUS

## 2012-04-09 MED ORDER — HYDROCODONE-ACETAMINOPHEN 5-325 MG PO TABS: 1.0000 | ORAL_TABLET | ORAL | Status: DC | PRN

## 2012-04-09 MED ORDER — ONDANSETRON HCL 4 MG/2ML IJ SOLN: 4.0000 mg | Freq: Four times a day (QID) | INTRAMUSCULAR | Status: DC | PRN

## 2012-04-09 SURGICAL SUPPLY — 56 items
ADH SKN CLS APL DERMABOND .7 (GAUZE/BANDAGES/DRESSINGS) ×1
APL SKNCLS STERI-STRIP NONHPOA (GAUZE/BANDAGES/DRESSINGS)
APPLIER CLIP 9.375 MED OPEN (MISCELLANEOUS) ×2
APR CLP MED 9.3 20 MLT OPN (MISCELLANEOUS) ×1
BANDAGE ELASTIC 6 VELCRO ST LF (GAUZE/BANDAGES/DRESSINGS) IMPLANT
BENZOIN TINCTURE PRP APPL 2/3 (GAUZE/BANDAGES/DRESSINGS) IMPLANT
BLADE HEX COATED 2.75 (ELECTRODE) ×2 IMPLANT
BLADE SURG 15 STRL LF DISP TIS (BLADE) ×1 IMPLANT
BLADE SURG 15 STRL SS (BLADE) ×2
CANISTER SUCTION 1200CC (MISCELLANEOUS) ×2 IMPLANT
CHLORAPREP W/TINT 26ML (MISCELLANEOUS) ×2 IMPLANT
CLIP APPLIE 9.375 MED OPEN (MISCELLANEOUS) ×1 IMPLANT
CLOTH BEACON ORANGE TIMEOUT ST (SAFETY) ×2 IMPLANT
COVER MAYO STAND STRL (DRAPES) ×2 IMPLANT
COVER TABLE BACK 60X90 (DRAPES) ×2 IMPLANT
DECANTER SPIKE VIAL GLASS SM (MISCELLANEOUS) IMPLANT
DERMABOND ADVANCED (GAUZE/BANDAGES/DRESSINGS) ×1
DERMABOND ADVANCED .7 DNX12 (GAUZE/BANDAGES/DRESSINGS) ×1 IMPLANT
DEVICE DUBIN W/COMP PLATE 8390 (MISCELLANEOUS) ×2 IMPLANT
DRAPE LAPAROSCOPIC ABDOMINAL (DRAPES) ×2 IMPLANT
DRAPE LAPAROTOMY TRNSV 102X78 (DRAPE) IMPLANT
DRAPE PED LAPAROTOMY (DRAPES) IMPLANT
DRAPE UTILITY XL STRL (DRAPES) ×2 IMPLANT
ELECT REM PT RETURN 9FT ADLT (ELECTROSURGICAL) ×2
ELECTRODE REM PT RTRN 9FT ADLT (ELECTROSURGICAL) ×1 IMPLANT
GAUZE SPONGE 4X4 12PLY STRL LF (GAUZE/BANDAGES/DRESSINGS) IMPLANT
GAUZE SPONGE 4X4 16PLY XRAY LF (GAUZE/BANDAGES/DRESSINGS) IMPLANT
GLOVE EUDERMIC 7 POWDERFREE (GLOVE) ×2 IMPLANT
GLOVE SKINSENSE NS SZ7.0 (GLOVE) ×1
GLOVE SKINSENSE STRL SZ7.0 (GLOVE) ×1 IMPLANT
GOWN PREVENTION PLUS XLARGE (GOWN DISPOSABLE) ×2 IMPLANT
GOWN PREVENTION PLUS XXLARGE (GOWN DISPOSABLE) ×2 IMPLANT
KIT MARKER MARGIN INK (KITS) ×2 IMPLANT
NEEDLE HYPO 22GX1.5 SAFETY (NEEDLE) IMPLANT
NEEDLE HYPO 25X1 1.5 SAFETY (NEEDLE) ×2 IMPLANT
NS IRRIG 1000ML POUR BTL (IV SOLUTION) ×2 IMPLANT
PACK BASIN DAY SURGERY FS (CUSTOM PROCEDURE TRAY) ×2 IMPLANT
PENCIL BUTTON HOLSTER BLD 10FT (ELECTRODE) ×2 IMPLANT
SLEEVE SCD COMPRESS KNEE MED (MISCELLANEOUS) ×2 IMPLANT
SPONGE LAP 4X18 X RAY DECT (DISPOSABLE) ×2 IMPLANT
STRIP CLOSURE SKIN 1/2X4 (GAUZE/BANDAGES/DRESSINGS) IMPLANT
SUT ETHILON 4 0 PS 2 18 (SUTURE) IMPLANT
SUT MNCRL AB 4-0 PS2 18 (SUTURE) ×2 IMPLANT
SUT SILK 2 0 SH (SUTURE) ×2 IMPLANT
SUT VIC AB 2-0 SH 27 (SUTURE) ×2
SUT VIC AB 2-0 SH 27XBRD (SUTURE) ×1 IMPLANT
SUT VIC AB 4-0 P-3 18XBRD (SUTURE) IMPLANT
SUT VIC AB 4-0 P3 18 (SUTURE)
SUT VICRYL 3-0 CR8 SH (SUTURE) ×4 IMPLANT
SYR BULB 3OZ (MISCELLANEOUS) IMPLANT
SYR CONTROL 10ML LL (SYRINGE) ×2 IMPLANT
TAPE HYPAFIX 4 X10 (GAUZE/BANDAGES/DRESSINGS) IMPLANT
TOWEL OR NON WOVEN STRL DISP B (DISPOSABLE) ×2 IMPLANT
TUBE CONNECTING 20X1/4 (TUBING) ×2 IMPLANT
WATER STERILE IRR 1000ML POUR (IV SOLUTION) IMPLANT
YANKAUER SUCT BULB TIP NO VENT (SUCTIONS) ×2 IMPLANT

## 2012-04-09 NOTE — Interval H&P Note (Signed)
History and Physical Interval Note:  04/09/2012 2:28 PM  Sarah [name]  has presented today for surgery, with the diagnosis of right breast cancer  The goals and the various methods of treatment have been discussed with the patient and family. After consideration of risks, benefits and other options for treatment, the patient has consented to  Procedure(s) (LRB) with comments: PARTIAL MASTECTOMY WITH NEEDLE LOCALIZATION (Right) as a surgical intervention .  The patient's history has been reviewed, patient examined, no change in status, stable for surgery.  I have reviewed the patient's chart and labs.  Questions were answered to the patient's satisfaction.     Ernestene Mention

## 2012-04-09 NOTE — Anesthesia Postprocedure Evaluation (Signed)
  Anesthesia Post-op Note  Patient: Sarah [name]  Procedure(s) Performed: Procedure(s) (LRB) with comments: PARTIAL MASTECTOMY WITH NEEDLE LOCALIZATION (Right)  Patient Location: PACU  Anesthesia Type:General  Level of Consciousness: awake, alert  and oriented  Airway and Oxygen Therapy: Patient Spontanous Breathing  Post-op Pain: mild  Post-op Assessment: Post-op Vital signs reviewed  Post-op Vital Signs: Reviewed  Complications: No apparent anesthesia complications

## 2012-04-09 NOTE — Anesthesia Procedure Notes (Signed)
Procedure Name: LMA Insertion Date/Time: 04/09/2012 2:41 PM Performed by: Zenia Resides D Pre-anesthesia Checklist: Patient identified, Emergency Drugs available, Suction available, Patient being monitored and Timeout performed Patient Re-evaluated:Patient Re-evaluated prior to inductionOxygen Delivery Method: Circle System Utilized Preoxygenation: Pre-oxygenation with 100% oxygen Intubation Type: IV induction Ventilation: Mask ventilation without difficulty LMA: LMA inserted LMA Size: 4.0 Number of attempts: 1 Airway Equipment and Method: bite block Placement Confirmation: positive ETCO2 and breath sounds checked- equal and bilateral Tube secured with: Tape Dental Injury: Teeth and Oropharynx as per pre-operative assessment

## 2012-04-09 NOTE — Anesthesia Preprocedure Evaluation (Signed)
Anesthesia Evaluation  Patient identified by MRN, date of birth, ID band Patient awake    Reviewed: Allergy & Precautions, H&P , NPO status , Patient's Chart, lab work & pertinent test results  Airway Mallampati: I TM Distance: >3 FB Neck ROM: Full    Dental  (+) Teeth Intact, Partial Upper and Dental Advisory Given   Pulmonary  breath sounds clear to auscultation        Cardiovascular hypertension, Pt. on medications Rhythm:Regular Rate:Normal     Neuro/Psych    GI/Hepatic   Endo/Other    Renal/GU      Musculoskeletal   Abdominal   Peds  Hematology   Anesthesia Other Findings   Reproductive/Obstetrics                           Anesthesia Physical Anesthesia Plan  ASA: II  Anesthesia Plan: General   Post-op Pain Management:    Induction: Intravenous  Airway Management Planned: LMA  Additional Equipment:   Intra-op Plan:   Post-operative Plan:   Informed Consent: I have reviewed the patients History and Physical, chart, labs and discussed the procedure including the risks, benefits and alternatives for the proposed anesthesia with the patient or authorized representative who has indicated his/her understanding and acceptance.   Dental advisory given  Plan Discussed with: CRNA, Anesthesiologist and Surgeon  Anesthesia Plan Comments:         Anesthesia Quick Evaluation

## 2012-04-09 NOTE — Op Note (Signed)
Patient Name:           DAWNIELLE CHRISTIANA   Date of Surgery:        04/09/2012  Pre op Diagnosis:      Ductal carcinoma in situ right breast, upper inner quadrant, receptor positive, stage Tis, N0, clinical  Post op Diagnosis:    Same  Procedure:                 Right partial mastectomy with needle localization  Surgeon:                     Angelia Mould. Derrell Lolling, M.D., FACS  Assistant:                      None  Operative Indications:   NIMO VERASTEGUI is a 78 y.o. female. She Presents for discussion regarding management options for treatment of her noninvasive cancer of the right breast, upper inner quadrant.  Her recent image guided biopsy shows low to intermediate grade DCIS of the right breast, upper inner quadrant. Breast diagnostic profile revealed strongly positive ER and PR. MRI was performed and this shows a 2.4 cm area of non-masslike, patchy enhancement in the right breast, upper inner quadrant which correlates to the known area of DCIS. It was felt that some of this enhancement is secondary to biopsy changes. The left breast looks normal. There were no enlarged lymph nodes. This appeared to be a solitary finding of the right breast.  I have discussed of her treatment planning treatment options as an outpatient.. We went over partial mastectomy with needle localization. We talked about adjuvant therapies include radiation therapy and antiestrogen therapy. We talked about mastectomy. She was offered referral to a medical oncologist and radiation oncologist for second opinion and she declined that. She seems most interested in breast conservation surgery which I think is most appropriate in this setting     Operative Findings:       The specimen was localized with 2 bracketed wires were directed from superior to inferior and posterior. The area of DCIS was posteriorly located slight the dissection all the way down to and including the pectoralis fascia. The posterior margin is therefore the  pectoralis major muscle. The specimen mammogram looked good and everything seemed to be contained within the specimen.  Procedure in Detail:          The patient underwent wire localization by Dr. Tilda Burrow at [facility]. She was brought to the operating room and CDS center. General anesthesia was induced. Intravenous antibiotics were given. The right breast was prepped and draped in a sterile fashion. Surgical time out was performed. I made a curvilinear incision at the 11:00 to about 1:30 position between the 2 wires. I carried the  dissection down around the wires, and posteriorly spreadout fairly generously. Specimen was removed and marked with silk sutures and a 6 color ink kit.Marland Kitchen Specimen mammogram looked good and the density and the localizing wires were contained well within the specimen. Specimen was marked and sent to pathology. Hemostasis was excellent and achieved with electrocautery. I undermined the breast parenchyma inferiorly and superiorly so as to slide it together somewhat. This was closed in several layers to reconstruct the breast mound. The skin was closed with a running  subcuticular suture of 4-0 Monocryl and Dermabond. Breast binder and ice pack were placed. Patient was taken recovery in stable condition. EBL 15 cc. Counts correct. Consultations none.  Angelia Mould. Derrell Lolling, M.D., FACS General and Minimally Invasive Surgery Breast and Colorectal Surgery  04/09/2012 3:30 PM

## 2012-04-09 NOTE — Transfer of Care (Signed)
Immediate Anesthesia Transfer of Care Note  Patient: Sarah [name]  Procedure(s) Performed: Procedure(s) (LRB) with comments: PARTIAL MASTECTOMY WITH NEEDLE LOCALIZATION (Right)  Patient Location: PACU  Anesthesia Type:General  Level of Consciousness: awake, alert  and oriented  Airway & Oxygen Therapy: Patient Spontanous Breathing and Patient connected to face mask oxygen  Post-op Assessment: Report given to PACU RN and Post -op Vital signs reviewed and stable  Post vital signs: Reviewed and stable  Complications: No apparent anesthesia complications

## 2012-04-10 ENCOUNTER — Ambulatory Visit (INDEPENDENT_AMBULATORY_CARE_PROVIDER_SITE_OTHER): Payer: Medicare Other | Admitting: General Surgery

## 2012-04-10 ENCOUNTER — Telehealth (INDEPENDENT_AMBULATORY_CARE_PROVIDER_SITE_OTHER): Payer: Self-pay

## 2012-04-10 ENCOUNTER — Encounter (HOSPITAL_BASED_OUTPATIENT_CLINIC_OR_DEPARTMENT_OTHER): Payer: Self-pay | Admitting: General Surgery

## 2012-04-10 ENCOUNTER — Other Ambulatory Visit (INDEPENDENT_AMBULATORY_CARE_PROVIDER_SITE_OTHER): Payer: Self-pay | Admitting: General Surgery

## 2012-04-10 VITALS — BP 142/76 | HR 74 | Temp 97.6°F | Resp 16 | Ht 64.0 in | Wt 129.1 lb

## 2012-04-10 DIAGNOSIS — Z09 Encounter for follow-up examination after completed treatment for conditions other than malignant neoplasm: Secondary | ICD-10-CM

## 2012-04-10 DIAGNOSIS — C50219 Malignant neoplasm of upper-inner quadrant of unspecified female breast: Secondary | ICD-10-CM

## 2012-04-10 LAB — POCT HEMOGLOBIN-HEMACUE: Hemoglobin: 16.4 g/dL — ABNORMAL HIGH (ref 12.0–15.0)

## 2012-04-10 NOTE — Progress Notes (Signed)
History: Patient is status post right breast lumpectomy yesterday. She was concerned that her compression garment may have damaged her incision or cause bleeding based on the appearance of the wound. She is seen in the urgent office.  Exam: BP 142/76  Pulse 74  Temp 97.6 F (36.4 C) (Temporal)  Resp 16  Ht 5\' 4"  (1.626 m)  Wt 129 lb 2 oz (58.571 kg)  BMI 22.16 kg/m2 There is a transverse incision across the upper right breast. There is a little dimpling beneath the incision where it appears to Dermabond has pulled a little bit of skin up to the incision. There is some significant ecchymosis across the upper breast but no hematoma and the incision itself looks fine.  Assessment and plan. One day status post lobectomy. I do not see hematoma or other complication and she is reassured. She will keep her regular appointment with Dr. Derrell Lolling. Pathology is pending.

## 2012-04-10 NOTE — Telephone Encounter (Signed)
Pt called stating she has increased swelling with "a lot of black and blue" to skin. Reviewed with Dr Derrell Lolling via phone. Per Dr Derrell Lolling pt is to come to office this afternoon and he or Dr Johna Sheriff will see pt to r/o hematoma. Pt states she will be at our office at 4:00pm today.

## 2012-04-11 NOTE — Progress Notes (Signed)
Quick Note:  Inform patient of Pathology report,. All of the cancer was non-invasive (DCIS). Margins are negative. No further surgery necessary. Good news. ______

## 2012-04-12 ENCOUNTER — Telehealth (INDEPENDENT_AMBULATORY_CARE_PROVIDER_SITE_OTHER): Payer: Self-pay | Admitting: General Surgery

## 2012-04-12 NOTE — Telephone Encounter (Signed)
Patient called and informed of pathology results per Dr. Derrell Lolling: "All of the cancer was non-invasive (DCIS). Margins are negative. No further surgery necessary. Good news."

## 2012-04-17 ENCOUNTER — Ambulatory Visit
Admission: RE | Admit: 2012-04-17 | Discharge: 2012-04-17 | Disposition: A | Discharge status: Home / Self Care | Payer: Medicare Other | Source: Ambulatory Visit | Attending: Radiation Oncology | Admitting: Radiation Oncology

## 2012-04-17 ENCOUNTER — Encounter: Payer: Self-pay | Admitting: Radiation Oncology

## 2012-04-17 VITALS — BP 144/78 | HR 76 | Temp 97.7°F | Resp 20 | Ht 64.5 in | Wt 127.8 lb

## 2012-04-17 DIAGNOSIS — C50219 Malignant neoplasm of upper-inner quadrant of unspecified female breast: Secondary | ICD-10-CM

## 2012-04-17 DIAGNOSIS — D0590 Unspecified type of carcinoma in situ of unspecified breast: Secondary | ICD-10-CM | POA: Diagnosis not present

## 2012-04-17 HISTORY — DX: Allergy, unspecified, initial encounter: T78.40XA

## 2012-04-17 HISTORY — DX: Anxiety disorder, unspecified: F41.9

## 2012-04-17 HISTORY — DX: Dizziness and giddiness: R42

## 2012-04-17 HISTORY — DX: Unspecified cataract: H26.9

## 2012-04-17 HISTORY — DX: Cyst of pancreas: K86.2

## 2012-04-17 HISTORY — DX: Unspecified glaucoma: H40.9

## 2012-04-17 HISTORY — DX: Malignant neoplasm of unspecified site of unspecified female breast: C50.919

## 2012-04-17 NOTE — Progress Notes (Signed)
[facility] Radiation Oncology NEW PATIENT EVALUATION  Name: Sarah [name] MRN: [id number]  Date:   04/17/2012           DOB: July 14, 1932  Status: outpatient   CC: Sarah Otto, MD  Sarah [name], *    REFERRING PHYSICIAN: Dr. Claud Kelp  DIAGNOSIS: Stage 0 (Tis N0)  DCIS of the right breast   HISTORY OF PRESENT ILLNESS:  Sarah [name] is a 78 y.o. female who is seen today for the courtesy of Dr. Claud Kelp for discussion of possible radiation therapy in the management of her DCIS following conservative surgery. At the time of a screening mammogram from 07/11/2011 at [facility] there were benign appearing calcifications within the right breast. A short six-month interval followup mammogram was recommended and she returned 02/28/2012 at which time there was an increase in microcalcifications posteriorly along the medial aspect of the right breast. An ultrasound showed internal echoes in the 1:00 position, 7 cm from the nipple. This correlated with a small mass and associated microcalcifications measuring 4 x 5 mm. A stereotactic biopsy on 03/14/2012 was diagnostic for DCIS with calcifications. She was seen by Dr. Derrell Lolling who performed a right partial mastectomy on 04/09/2012. Not her surgery she describes as having her breast binder slip and pressing on her surgical wound along the superior aspect of her right breast. She noted a fair amount of ecchymosis. On review of her pathology she was found to have DCIS, grade 2 with necrosis and calcifications. The DCIS was 0.3 cm from the nearest margin, anteriorly. DCIS is ER/PR positive. HER-2/neu not tested.  PREVIOUS RADIATION THERAPY: No   PAST MEDICAL HISTORY:  has a past medical history of PONV (postoperative nausea and vomiting); Hypertension; Hyperlipemia; Arthritis; Cancer; Wears glasses; Wears partial dentures; HOH (hard of hearing); Breast cancer (03/14/12); Cataract; Hypothyroidism; Pancreatic cyst;  Allergy; Anxiety; Glaucoma; and Vertigo.     PAST SURGICAL HISTORY:  Past Surgical History  Procedure Date  . Tonsillectomy   . Total knee arthroplasty 08/16/2011    Procedure: TOTAL KNEE ARTHROPLASTY;  Surgeon: Loreta Ave, MD;  Location: [facility] OR;  Service: Orthopedics;  Laterality: Right;  . Joint replacement 2013    rt total knee  . Joint replacement 1995    lt total knee  . Breast surgery 1992    lumpectomy-lt  . Colonoscopy   . Eye surgery     bilateral cataract removal  . Partial mastectomy with needle localization 04/09/2012    Procedure: PARTIAL MASTECTOMY WITH NEEDLE LOCALIZATION;  Surgeon: Ernestene Mention, MD;  Location: [facility];  Service: General;  Laterality: Right;  . Cataract extraction     b/l  . Abdominal hysterectomy 1966    1/2 ovary left in      FAMILY HISTORY: family history includes Cancer in her maternal aunt and Heart disease in her father. Her father's health is unknown. Her mother died of some type of blood disorder at age 29. No family history breast cancer.   SOCIAL HISTORY:  reports that she has never smoked. She does not have any smokeless tobacco history on file. She reports that she does not drink alcohol or use illicit drugs. [place] at approximately 11 months ago, 2 children. She worked as an Network engineer.   ALLERGIES: Codeine and Thiazide-type diuretics   MEDICATIONS:  Current Outpatient Prescriptions  Medication Sig Dispense Refill  . amLODipine (NORVASC) 5 MG tablet Take 5 mg by mouth 2 (two) times daily.      Marland Kitchen  aspirin 81 MG tablet Take 81 mg by mouth daily.      Marland Kitchen atorvastatin (LIPITOR) 10 MG tablet Daily.      . Cholecalciferol (D3-1000 PO) Take by mouth daily.      Marland Kitchen HYDROcodone-acetaminophen (NORCO/VICODIN) 5-325 MG per tablet Take 1-2 tablets by mouth every 4 (four) hours as needed for pain.  50 tablet  1  . levothyroxine (SYNTHROID, LEVOTHROID) 50 MCG tablet Take 50 mcg by mouth daily.      . Lutein 20 MG  TABS Take by mouth daily.      . Misc Natural Products (GRAPE SEED COMPLEX PO) Take by mouth 2 (two) times daily.      . Multiple Vitamins-Minerals (PRESERVISION AREDS PO) Take by mouth daily.      . simvastatin (ZOCOR) 10 MG tablet Take 10 mg by mouth at bedtime.      Marland Kitchen zolpidem (AMBIEN) 5 MG tablet Take 5 mg by mouth at bedtime as needed.         REVIEW OF SYSTEMS:  Pertinent items are noted in HPI.    PHYSICAL EXAM:  height is 5' 4.5" (1.638 m) and weight is 127 lb 12.8 oz (57.97 kg). Her oral temperature is 97.7 F (36.5 C). Her blood pressure is 144/78 and her pulse is 76. Her respiration is 20.   BP 144/78  Pulse 76  Temp 97.7 F (36.5 C) (Oral)  Resp 20  Ht 5' 4.5" (1.638 m)  Wt 127 lb 12.8 oz (57.97 kg)  BMI 21.60 kg/m2  Alert and oriented, appearing much younger than her stated age of 30. Head and neck examination: Grossly unremarkable. Nodes: Without palpable cervical, supraclavicular, or axillary lymphadenopathy. Chest: Lungs clear. Back: Without spinal or CVA tenderness. Heart: Regular in rhythm. Breasts: There is patchy ecchymosis along the right breast, more so superiorly. There is no palpable hematoma. There is a partial mastectomy wound extending from 11 to 1:00 which is healing well. Left breast without masses or lesions. Abdomen without hepatomegaly. Extremities without edema. Neurologic examination: Grossly nonfocal.    LABORATORY DATA: Lab Results  Component Value Date   WBC 4.7 04/05/2012   HGB 16.4* 04/09/2012   HCT 45.6 04/05/2012   MCV 88.0 04/05/2012   PLT 174 04/05/2012   Lab Results  Component Value Date   NA 142 04/05/2012   K 4.1 04/05/2012   CL 104 04/05/2012   CO2 28 04/05/2012   Lab Results  Component Value Date   ALT 14 04/05/2012   AST 20 04/05/2012   ALKPHOS 77 04/05/2012   BILITOT 0.4 04/05/2012      IMPRESSION: Stage 0 (Tis N0) DCIS of the right breast. I explained to the patient and her daughter that her local treatment options  include mastectomy versus partial mastectomy, with or without radiation therapy, and/or with without adjuvant hormone therapy. We discussed clinical factors which predict risk for local failure including surgical margins, nuclear grade/comedonecrosis, and size of DCIS. Based on the original, and modified Judith Part scores (score of 6 and 7) she would be a candidate for radiation therapy to reduce her risk for local failure. She could have reexcision of her anterior margin in which case she could decrease her risk for local failure, and could probably avoid breast radiation. She is reluctant to consider further breast surgery. The other issues her age of 66 and recognizing that she is not going to die from breast cancer regardless of how she manages her DCIS. Considering her young physiologic age  I would willing to offer her radiation therapy. She tells me that she is interested in decreasing her risk for local failure which which may be in the range of 15% with partial mastectomy alone to approximately 5% with adjuvant radiation therapy. Furthermore, adjuvant hormone therapy can decrease the risk for local failure by 50% and also reduce the risk for development of a new cancer in either breast by approximately 50%. If she wants to proceed with radiation therapy and I would give her hyperfractionated treatment over a period of 3-1/2-4 weeks. I will also obtain a baseline mammogram to confirm removal of all suspicious microcalcifications when she has adequately healed and has recovered from her ecchymoses. This may be in another month or so. Lastly, she may be a candidate for the B. 43 protocol if she is not excluded based on her age. I did not go into much detail about this protocol not knowing whether not she wanted to consider radiation therapy. We discussed the potential acute and late toxicities of radiation therapy. She'll contact me if she wants to consider radiation therapy.   PLAN: As discussed above.   I  spent 60 minutes minutes face to face with the patient and more than 50% of that time was spent in counseling and/or coordination of care.

## 2012-04-17 NOTE — Progress Notes (Signed)
New Consult Right Breast Cancer 04/10/12=Right Breast lumpectomy=DCIS,grade II with necrosis and calcifications,Upper inner quadrant,ER/PR=Positive Dr. Edythe Lynn DX 03/14/12 Biopsy left breast 30 years ago by Dr. Jorja Loa Davis=benign disease Widowed, 2 children ,alert oriented x3, right breast purplish yellow/greenish  Healing hematoma, swelling above incision, and at axilla , hurts to wear a bra, pain level (5)1- 10 scale,  not taking any  Meds,not wearing a bra, No Radiation or Pacemaker  Allergies: Codeine,Thiazide-type diuretics

## 2012-04-17 NOTE — Progress Notes (Signed)
Please see the Nurse Progress Note in the MD Initial Consult Encounter for this patient. 

## 2012-04-17 NOTE — Addendum Note (Signed)
Encounter addended by: Keirstan Iannello Mintz Sanel Stemmer, RN on: 04/17/2012  7:22 PM<BR>     Documentation filed: Charges VN

## 2012-04-24 ENCOUNTER — Telehealth: Payer: Self-pay | Admitting: *Deleted

## 2012-04-24 NOTE — Telephone Encounter (Signed)
Confirmed 04/25/12 appt w/ pt.  Unable to mail before appt letter & packet.  Gave verbal. Alinda Deem at [facility] to make her aware.  Made chart.

## 2012-04-25 ENCOUNTER — Encounter: Payer: Self-pay | Admitting: Oncology

## 2012-04-25 ENCOUNTER — Other Ambulatory Visit: Payer: Self-pay | Admitting: *Deleted

## 2012-04-25 ENCOUNTER — Other Ambulatory Visit (HOSPITAL_BASED_OUTPATIENT_CLINIC_OR_DEPARTMENT_OTHER): Payer: Medicare Other | Admitting: Lab

## 2012-04-25 ENCOUNTER — Ambulatory Visit (HOSPITAL_BASED_OUTPATIENT_CLINIC_OR_DEPARTMENT_OTHER): Payer: Medicare Other | Admitting: Oncology

## 2012-04-25 ENCOUNTER — Encounter (INDEPENDENT_AMBULATORY_CARE_PROVIDER_SITE_OTHER): Payer: Self-pay | Admitting: General Surgery

## 2012-04-25 ENCOUNTER — Other Ambulatory Visit: Payer: Self-pay | Admitting: Emergency Medicine

## 2012-04-25 ENCOUNTER — Ambulatory Visit (INDEPENDENT_AMBULATORY_CARE_PROVIDER_SITE_OTHER): Payer: Medicare Other | Admitting: General Surgery

## 2012-04-25 ENCOUNTER — Ambulatory Visit: Payer: Medicare Other

## 2012-04-25 VITALS — BP 132/84 | HR 77 | Temp 98.3°F | Resp 14 | Ht 64.5 in | Wt 128.0 lb

## 2012-04-25 VITALS — BP 155/77 | HR 74 | Temp 98.1°F | Resp 20 | Ht 64.5 in | Wt 128.8 lb

## 2012-04-25 DIAGNOSIS — C50919 Malignant neoplasm of unspecified site of unspecified female breast: Secondary | ICD-10-CM | POA: Diagnosis not present

## 2012-04-25 DIAGNOSIS — D0590 Unspecified type of carcinoma in situ of unspecified breast: Secondary | ICD-10-CM | POA: Diagnosis not present

## 2012-04-25 DIAGNOSIS — C50219 Malignant neoplasm of upper-inner quadrant of unspecified female breast: Secondary | ICD-10-CM

## 2012-04-25 DIAGNOSIS — Z170 Estrogen receptor positive status [ER+]: Secondary | ICD-10-CM

## 2012-04-25 LAB — COMPREHENSIVE METABOLIC PANEL (CC13)
ALT: 13 U/L (ref 0–55)
AST: 17 U/L (ref 5–34)
Albumin: 3.8 g/dL (ref 3.5–5.0)
Alkaline Phosphatase: 85 U/L (ref 40–150)
BUN: 21 mg/dL (ref 7.0–26.0)
Calcium: 10.1 mg/dL (ref 8.4–10.4)
Chloride: 106 mEq/L (ref 98–107)
Potassium: 3.6 mEq/L (ref 3.5–5.1)
Sodium: 142 mEq/L (ref 136–145)

## 2012-04-25 LAB — CBC WITH DIFFERENTIAL/PLATELET
BASO%: 1 % (ref 0.0–2.0)
EOS%: 1.5 % (ref 0.0–7.0)
HGB: 14.6 g/dL (ref 11.6–15.9)
MCH: 30.5 pg (ref 25.1–34.0)
MCHC: 33.9 g/dL (ref 31.5–36.0)
MCV: 90 fL (ref 79.5–101.0)
MONO%: 8.2 % (ref 0.0–14.0)
RBC: 4.79 10*6/uL (ref 3.70–5.45)
RDW: 13.7 % (ref 11.2–14.5)
lymph#: 1.2 10*3/uL (ref 0.9–3.3)

## 2012-04-25 NOTE — Patient Instructions (Signed)
Your right lumpectomy incision is healing without any signs of infection or skin problems. The bruising should resolve within 2-3 weeks.  Be sure to keep your appointment with Dr. Darnelle Catalan today.  Return to see Dr. Derrell Lolling in 6 weeks.

## 2012-04-25 NOTE — Progress Notes (Signed)
ID: [name]   DOB: [birth year]  MR#: [id number]  NGE#:[id number]  PCP: Ginette Otto, MD GYN:  SUClaud Kelp OTHER MD: Chipper Herb, Mckinley Jewel   HISTORY OF PRESENT ILLNESS: The patient had bilateral diagnostic mammography at [facility] 07/11/2011. This showed some calcifications in the right breast, which seemed a little bit more prominent than prior. In the left breast there was an area of possible architectural distortion. However left breast ultrasound the same day showed no abnormality. 6 month followup was suggested, and on 02/28/2012 the patient again had bilateral diagnostic mammography, now with right ultrasonography. The microcalcifications in the right breast appeared increased. Ultrasound showed a hypoechoic lesion measuring 5 mm, with no associated shadowing. This had been previously noted and appeared unchanged. Anterior to that there was a small hypoechoic mass measuring 4 mm in diameter. There was felt to be suspicious, and on 03/14/2012 the patient underwent biopsy of the right breast mass, showing ([id number]) ductal carcinoma in situ, intermediate grade, estrogen receptor 100% and progesterone receptor 100% positive.  Bilateral breast MRI was obtained 03/26/2012. This showed only post biopsy changes in the right breast, associated with an area of non-masslike enhancement measuring 2.4 cm. The left breast was unremarkable, and there was no enlarged axillary or internal mammary adenopathy noted. Accordingly on 04/09/2012 the patient underwent right lumpectomy, the pathology ([id number]) showing ductal carcinoma in situ measuring 2.0 cm, grade 2, with negative margins, the closest being 0.3 cm. The patient's subsequent history is as detailed below.  INTERVAL HISTORY: The patient was seen at the breast clinic 04/25/2012 accompanied by her daughter. She has previously met with Dr. Dayton Scrape in radiation oncology. REVIEW OF SYSTEMS:  PAST MEDICAL HISTORY: Past Medical  History  Diagnosis Date  . PONV (postoperative nausea and vomiting)   . Hypertension   . Hyperlipemia   . Arthritis   . Cancer     breast  . Wears glasses   . Wears partial dentures     partial upper  . HOH (hard of hearing)   . Breast cancer 03/14/12    bx=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,ER/PR=+,upper inner quad  . Cataract   . Hypothyroidism   . Pancreatic cyst     benign  . Allergy     codeine, thiazides  . Anxiety     new dx  . Glaucoma     laser treated years ago  . Vertigo     PAST SURGICAL HISTORY: Past Surgical History  Procedure Date  . Tonsillectomy   . Total knee arthroplasty 08/16/2011    Procedure: TOTAL KNEE ARTHROPLASTY;  Surgeon: Loreta Ave, MD;  Location: [facility] OR;  Service: Orthopedics;  Laterality: Right;  . Joint replacement 2013    rt total knee  . Joint replacement 1995    lt total knee  . Breast surgery 1992    lumpectomy-lt  . Colonoscopy   . Eye surgery     bilateral cataract removal  . Partial mastectomy with needle localization 04/09/2012    Procedure: PARTIAL MASTECTOMY WITH NEEDLE LOCALIZATION;  Surgeon: Ernestene Mention, MD;  Location:  SURGERY CENTER;  Service: General;  Laterality: Right;  . Cataract extraction     b/l  . Abdominal hysterectomy 1966    1/2 ovary left in     FAMILY HISTORY Family History  Problem Relation Age of Onset  . Heart disease Father   . Cancer Maternal Aunt     stomach   the patient never met  her father, does not know his cause of death or age at death, but thinks it may have been of likely a heart attack based on comments in the obituary. The patient's mother died at the age of 48 from a rare blood disorder. The patient does not recall the name of the problem. The patient has no full siblings. She may have a half-brother. There is no history of breast or ovarian cancer in the family.  GYNECOLOGIC HISTORY: Menarche age 89, first live birth age 36, the patient is GX P2.  She had her hysterectomy at age 30 and took hormone replacement until approximately 1973.  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 She is a widow, her husband died of from acute myeloid leukemia approximately a year ago (he only lived 6 weeks from the time of diagnosis). Son [name] lives in [place] and is a Sport and exercise psychologist. Daughter [name] lives in [place] and is a probation and prolapse as her. The patient has 1 grandchild. She attends a [employer].    ADVANCED DIRECTIVES: In place; the patient tells me her daughter [name] is her healthcare power of attorney. She can be reached at [phone]. The patient also wanted [place] to have her son's phone number. It is [phone].  HEALTH MAINTENANCE: History  Substance Use Topics  . Smoking status: Never Smoker   . Smokeless tobacco: Not on file  . Alcohol Use: No     Colonoscopy: 2013/[facility]   PAP: s/p hysterectomy  Bone density: 2013/ "normal"  Lipid panel:  Allergies  Allergen Reactions  . Codeine Other (See Comments)    Unknown reaction  . Thiazide-Type Diuretics Other (See Comments)    Blurry vision    Current Outpatient Prescriptions  Medication Sig Dispense Refill  . amLODipine (NORVASC) 5 MG tablet Take 5 mg by mouth 2 (two) times daily.      Marland Kitchen aspirin 81 MG tablet Take 81 mg by mouth daily.      Marland Kitchen atorvastatin (LIPITOR) 10 MG tablet Daily.      . Cholecalciferol (D3-1000 PO) Take by mouth daily.      Marland Kitchen levothyroxine (SYNTHROID, LEVOTHROID) 50 MCG tablet Take 50 mcg by mouth daily.      . Lutein 20 MG TABS Take by mouth daily.      . Misc Natural Products (GRAPE SEED COMPLEX PO) Take by mouth 2 (two) times daily.      . Multiple Vitamins-Minerals (PRESERVISION AREDS PO) Take by mouth daily.      . simvastatin (ZOCOR) 10 MG tablet Take 10 mg by mouth at bedtime.      Marland Kitchen zolpidem (AMBIEN) 5 MG tablet Take 5 mg by mouth at bedtime as needed.        OBJECTIVE: Middle-aged white woman who appears  well Filed Vitals:   04/25/12 1628  BP: 155/77  Pulse: 74  Temp: 98.1 F (36.7 C)  Resp: 20     Body mass index is 21.77 kg/(m^2).    ECOG FS: 0  Sclerae unicteric Oropharynx clear No cervical or supraclavicular adenopathy Lungs no rales or rhonchi Heart regular rate and rhythm Abd benign MSK no focal spinal tenderness, no peripheral edema Neuro: nonfocal Breasts: The right breast is status post lumpectomy. The incision is healing very nicely. The cosmetic result is good. There is some resolving ecchymosis. The right axilla is benign. The left breast is unremarkable.   LAB RESULTS: Lab Results  Component Value Date   WBC 5.0 04/25/2012   NEUTROABS 3.3  04/25/2012   HGB 14.6 04/25/2012   HCT 43.1 04/25/2012   MCV 90.0 04/25/2012   PLT 187 04/25/2012      Chemistry      Component Value Date/Time   NA 142 04/25/2012 1606   NA 142 04/05/2012 1350   K 3.6 04/25/2012 1606   K 4.1 04/05/2012 1350   CL 106 04/25/2012 1606   CL 104 04/05/2012 1350   CO2 28 04/25/2012 1606   CO2 28 04/05/2012 1350   BUN 21.0 04/25/2012 1606   BUN 19 04/05/2012 1350   CREATININE 0.8 04/25/2012 1606   CREATININE 0.51 04/05/2012 1350      Component Value Date/Time   CALCIUM 10.1 04/25/2012 1606   CALCIUM 10.4 04/05/2012 1350   ALKPHOS 85 04/25/2012 1606   ALKPHOS 77 04/05/2012 1350   AST 17 04/25/2012 1606   AST 20 04/05/2012 1350   ALT 13 04/25/2012 1606   ALT 14 04/05/2012 1350   BILITOT 0.67 04/25/2012 1606   BILITOT 0.4 04/05/2012 1350       No results found for this basename: LABCA2    No components found with this basename: LABCA125    No results found for this basename: INR:1;PROTIME:1 in the last 168 hours  Urinalysis    Component Value Date/Time   COLORURINE YELLOW 04/09/2012 1202   APPEARANCEUR CLEAR 04/09/2012 1202   LABSPEC 1.011 04/09/2012 1202   PHURINE 7.0 04/09/2012 1202   GLUCOSEU NEGATIVE 04/09/2012 1202   HGBUR NEGATIVE 04/09/2012 1202   BILIRUBINUR NEGATIVE  04/09/2012 1202   KETONESUR NEGATIVE 04/09/2012 1202   PROTEINUR NEGATIVE 04/09/2012 1202   UROBILINOGEN 0.2 04/09/2012 1202   NITRITE NEGATIVE 04/09/2012 1202   LEUKOCYTESUR TRACE* 04/09/2012 1202    STUDIES: BILATERAL BREAST MRI WITH AND WITHOUT CONTRAST  Technique: Multiplanar, multisequence MR images of both breasts  were obtained prior to and following the intravenous administration  of 11ml of multihance. Three dimensional images were evaluated at  the independent DynaCad workstation.  Comparison: Mammograms dated 03/14/2012, 02/28/2012, 07/11/2011  and 06/14/2009  Findings: There is a moderate background parenchymal enhancement  pattern.  Right breast: There is a clip artifact in the upper inner quadrant  of the right breast (corresponding to the dumbbell shaped clip on  the patient's mammogram) associated with non mass-like, patchy  enhancement measuring 2.4 cm. Some of the enhancement is likely  secondary to biopsy changes. Two additional clip artifacts are seen  in the right breast. There is a second clip deep in the 1 o'clock  region of the breast and there is a third clip artifact in the 12  o'clock anterior third of the breast.  Left breast: There is no abnormal enhancement the left breast.  No enlarged axillary or internal mammary adenopathy is detected.  Impression:  Post biopsy changes with non mass-like enhancement in the upper  inner quadrant of the right breast corresponding well with the  known DCIS.   ASSESSMENT: 78 y.o. [place] woman status post right lumpectomy 04/09/2012 for a ductal carcinoma in situ, grade 2, estrogen and progesterone receptor positive, both at 100%, with negative margins.  PLAN: We spent upwards of one hour are going over the biology of her tumor as well as her treatment choices. The patient understands this is not a life-threatening cancer and in fact did not make take your for a ductal carcinoma in situ may soon change to something  like a "atypical proliferation of breast cells" or some such euphemism to avoid the cystic mass  and anxiety related to the word "cancer". In any case, she understands that with surgery alone her risk of in breast recurrence would be in the 16% range or so. This means that 84% of women like her R. or ready "cured", the word cure of course meaning no recurrence within 10 years.  If she took radiation, which is what she plans to do, the risk of in breast recurrence would drop to about 5%. The benefit of adjuvant hormones in that setting would be minimal, perhaps a 2-3% further reduction of in breast recurrence and of course no effect on survival.  He she did not take a radiation but took anti-estrogens instead, that would cause her risk in half so she would have and 92% chance of "cure". We did discuss the possible toxicities side effects and complications of anti-estrogens and I gave her written information on tamoxifen and anastrozole for background.  After much discussion what meds he wants to do is to proceed with radiation. She feels likely she will not want anti-estrogens, though we did discuss the fact that she has a 1% per year risk of developing a new breast cancer in either breast. Radiation of course would not effect that risk. At estrogens would cut that risk in half.  She is going to see me again late February or early March, by which time she should be done with radiation. We will make a definitive decision regarding antiestrogens at that time. If she decides against them, we may release her to Dr. Laverle Hobby care.   MAGRINAT,GUSTAV C    04/26/2012

## 2012-04-25 NOTE — Progress Notes (Signed)
Checked in new patient. No financial issues. °

## 2012-04-25 NOTE — Progress Notes (Signed)
Patient ID: Sarah [name], female   DOB: [birth year], 78 y.o.   MRN: [id number] History: This patient underwent right partial mastectomy on November 19. Final pathology report shows intermediate grade ductal carcinoma in situ, strongly receptor positive, negative margins, but multiple foci. She is recovering without much pain. She does have some bruising. She saw Dr. Chipper Herb recently who discussed the advantages of radiation therapy and the patient is considering whether or not to do this. She has an appt. with Dr. Darnelle Catalan today.  Exam: Patient looks well. Family member with her. No distress. Right breast shows some ecchymoses throughout. Perhaps a small seroma. Nontender. No sign of infection. No skin necrosis.  Assessment: Ductal carcinoma in situ right breast, receptor positive, recovering uneventfully in the early postop period following right partial mastectomy  Plan: I advised her to follow the advice of Dr. Darnelle Catalan and Dr. Dayton Scrape regarding other adjuvant therapies Return to see me in 6 weeks   [facility]. Derrell Lolling, M.D., [facility] General and Minimally invasive Surgery Breast and Colorectal Surgery Office:   [phone] Pager:   [phone]

## 2012-04-26 ENCOUNTER — Telehealth: Payer: Self-pay | Admitting: *Deleted

## 2012-04-26 NOTE — Telephone Encounter (Signed)
Gave patient appointment for 07-2012  

## 2012-04-29 ENCOUNTER — Encounter: Payer: Self-pay | Admitting: Radiation Oncology

## 2012-04-29 NOTE — Progress Notes (Signed)
Chart note: I spoke with the patient this evening, and she wants to proceed with radiation therapy alone. She met with Dr. Darnelle Catalan last week. He discussed the B. 43 protocol with her and she is concerned about the possibility of cardiac toxicity, considering her age. I reminded her that we need to repeat her mammogram to confirm removal of all suspicious microcalcifications before proceeding with radiation therapy planning. She is probably going to wait until after the holidays before getting scheduled for mammography at [facility]. She'll call me in early January and we will get her set up for mammography and then CT simulation.

## 2012-05-06 DIAGNOSIS — L739 Follicular disorder, unspecified: Secondary | ICD-10-CM | POA: Diagnosis not present

## 2012-05-06 DIAGNOSIS — L819 Disorder of pigmentation, unspecified: Secondary | ICD-10-CM | POA: Diagnosis not present

## 2012-05-06 DIAGNOSIS — L821 Other seborrheic keratosis: Secondary | ICD-10-CM | POA: Diagnosis not present

## 2012-05-06 DIAGNOSIS — D239 Other benign neoplasm of skin, unspecified: Secondary | ICD-10-CM | POA: Diagnosis not present

## 2012-05-06 DIAGNOSIS — L659 Nonscarring hair loss, unspecified: Secondary | ICD-10-CM | POA: Diagnosis not present

## 2012-05-13 ENCOUNTER — Encounter: Payer: Self-pay | Admitting: *Deleted

## 2012-05-13 NOTE — Progress Notes (Signed)
Mailed after appt letter to pt. 

## 2012-05-23 ENCOUNTER — Other Ambulatory Visit: Payer: Self-pay | Admitting: Radiation Oncology

## 2012-05-23 DIAGNOSIS — C50919 Malignant neoplasm of unspecified site of unspecified female breast: Secondary | ICD-10-CM

## 2012-05-23 NOTE — Progress Notes (Signed)
Chart note: The patient called me today and wants to move ahead with radiation therapy to the right breast. I will get her scheduled for a baseline right breast mammogram at [facility] and then CT simulation.

## 2012-05-24 ENCOUNTER — Telehealth: Payer: Self-pay | Admitting: *Deleted

## 2012-05-24 NOTE — Telephone Encounter (Signed)
Called patient to inform of test and sim appt., spoke with patient and she is aware of both appts.

## 2012-05-29 DIAGNOSIS — Z853 Personal history of malignant neoplasm of breast: Secondary | ICD-10-CM | POA: Diagnosis not present

## 2012-06-03 ENCOUNTER — Encounter (INDEPENDENT_AMBULATORY_CARE_PROVIDER_SITE_OTHER): Payer: Self-pay

## 2012-06-03 ENCOUNTER — Ambulatory Visit
Admission: RE | Admit: 2012-06-03 | Discharge: 2012-06-03 | Disposition: A | Discharge status: Home / Self Care | Payer: Medicare Other | Source: Ambulatory Visit | Attending: Radiation Oncology | Admitting: Radiation Oncology

## 2012-06-03 DIAGNOSIS — Z510 Encounter for antineoplastic radiation therapy: Secondary | ICD-10-CM | POA: Diagnosis not present

## 2012-06-03 DIAGNOSIS — L589 Radiodermatitis, unspecified: Secondary | ICD-10-CM | POA: Diagnosis not present

## 2012-06-03 DIAGNOSIS — Y842 Radiological procedure and radiotherapy as the cause of abnormal reaction of the patient, or of later complication, without mention of misadventure at the time of the procedure: Secondary | ICD-10-CM | POA: Insufficient documentation

## 2012-06-03 DIAGNOSIS — C50219 Malignant neoplasm of upper-inner quadrant of unspecified female breast: Secondary | ICD-10-CM

## 2012-06-03 DIAGNOSIS — D0590 Unspecified type of carcinoma in situ of unspecified breast: Secondary | ICD-10-CM | POA: Diagnosis not present

## 2012-06-03 DIAGNOSIS — C50919 Malignant neoplasm of unspecified site of unspecified female breast: Secondary | ICD-10-CM | POA: Diagnosis not present

## 2012-06-03 NOTE — Progress Notes (Signed)
Simulation/treatment planning note: The patient was taken to the CT simulator. She was placed on a custom breast board with a custom neck mold constructed for immobilization. Her field borders were marked with a radiopaque wire. Her partial mastectomy scar and her nipple were also marked. She was then scanned. I contoured her tumor bed. She said tangential fields. 2 separate multileaf collimators were designed to conform the field. I prescribing 4250 cGy 17 sessions utilizing 6 MV photons. This be followed by electron beam boost for a further 750 cGy in 3 sessions with the anticipation of 15 MEV electrons dosimetry and an isodose plan are requested.

## 2012-06-04 DIAGNOSIS — Z510 Encounter for antineoplastic radiation therapy: Secondary | ICD-10-CM | POA: Diagnosis not present

## 2012-06-04 DIAGNOSIS — C50919 Malignant neoplasm of unspecified site of unspecified female breast: Secondary | ICD-10-CM | POA: Diagnosis not present

## 2012-06-04 DIAGNOSIS — L589 Radiodermatitis, unspecified: Secondary | ICD-10-CM | POA: Diagnosis not present

## 2012-06-04 DIAGNOSIS — D0590 Unspecified type of carcinoma in situ of unspecified breast: Secondary | ICD-10-CM | POA: Diagnosis not present

## 2012-06-06 ENCOUNTER — Ambulatory Visit (INDEPENDENT_AMBULATORY_CARE_PROVIDER_SITE_OTHER): Payer: Medicare Other | Admitting: General Surgery

## 2012-06-06 ENCOUNTER — Encounter (INDEPENDENT_AMBULATORY_CARE_PROVIDER_SITE_OTHER): Payer: Self-pay | Admitting: General Surgery

## 2012-06-06 VITALS — BP 160/90 | HR 82 | Temp 96.8°F | Resp 18 | Ht 64.5 in | Wt 127.2 lb

## 2012-06-06 DIAGNOSIS — C50219 Malignant neoplasm of upper-inner quadrant of unspecified female breast: Secondary | ICD-10-CM

## 2012-06-06 NOTE — Patient Instructions (Signed)
Your left breast incision has healed normally without any obvious complications. I agree with the radiation therapy.  Return to see Dr. Derrell Lolling in May or June.  We will plan to repeat your  bilateral mammograms in October or November.

## 2012-06-06 NOTE — Progress Notes (Signed)
Patient ID: Sarah [name], female   DOB: 1932/09/20, 79 y.o.   MRN: [id number] History: This patient returns for followup of her left breast cancer. On 04/09/2012 she underwent right partial mastectomy. Final pathology showed intermediate grade ductal carcinoma in situ, receptor positive, negative margins but multiple foci. She has healed and has no complaints about her breast. Pre-radiotherapy mammogram on 06/08/2012 shows a typical density at the partial mastectomy site, and some benign calcifications. She will start radiation therapy with Dr. Dayton Scrape soon. She has discussed the statistics and risk reduction with  antiestrogen therapy Dr. Darnelle Catalan and has not decided about that yet.  Exam: Patient looks well. Good spirits. Right breast shows the incision is healing nicely in the superior breast. No hematoma or seroma. Excellent cosmesis.  Assessment: Intermediate grade DCIS right breast, superior pole, receptor positive. Recovering uneventfully 2 months following partial mastectomy  Plan: Agree with adjuvant whole breast radiotherapy Decision regarding antiestrogen therapy pending was Dr. Darnelle Catalan. Encouraged followup with him Return to see me in 4 months for physical exam and  plan bilateral diagnostic mammograms in October or November.   [facility]. Derrell Lolling, M.D., [facility] General and Minimally invasive Surgery Breast and Colorectal Surgery Office:   [phone] Pager:   [phone]

## 2012-06-10 ENCOUNTER — Ambulatory Visit
Admission: RE | Admit: 2012-06-10 | Discharge: 2012-06-10 | Disposition: A | Discharge status: Home / Self Care | Payer: Medicare Other | Source: Ambulatory Visit | Attending: Radiation Oncology | Admitting: Radiation Oncology

## 2012-06-10 DIAGNOSIS — Z510 Encounter for antineoplastic radiation therapy: Secondary | ICD-10-CM | POA: Diagnosis not present

## 2012-06-10 DIAGNOSIS — C50919 Malignant neoplasm of unspecified site of unspecified female breast: Secondary | ICD-10-CM

## 2012-06-10 DIAGNOSIS — D0590 Unspecified type of carcinoma in situ of unspecified breast: Secondary | ICD-10-CM | POA: Diagnosis not present

## 2012-06-10 DIAGNOSIS — L589 Radiodermatitis, unspecified: Secondary | ICD-10-CM | POA: Diagnosis not present

## 2012-06-10 NOTE — Progress Notes (Signed)
Simulation verification note: The patient underwent similar to verification today. Her isocenter was in good position. The multileaf collimators contoured the treatment volume appropriately.

## 2012-06-11 ENCOUNTER — Ambulatory Visit
Admission: RE | Admit: 2012-06-11 | Discharge: 2012-06-11 | Disposition: A | Discharge status: Home / Self Care | Payer: Medicare Other | Source: Ambulatory Visit | Attending: Radiation Oncology | Admitting: Radiation Oncology

## 2012-06-11 DIAGNOSIS — C50919 Malignant neoplasm of unspecified site of unspecified female breast: Secondary | ICD-10-CM | POA: Diagnosis not present

## 2012-06-11 DIAGNOSIS — L589 Radiodermatitis, unspecified: Secondary | ICD-10-CM | POA: Diagnosis not present

## 2012-06-11 DIAGNOSIS — D0590 Unspecified type of carcinoma in situ of unspecified breast: Secondary | ICD-10-CM | POA: Diagnosis not present

## 2012-06-11 DIAGNOSIS — Z510 Encounter for antineoplastic radiation therapy: Secondary | ICD-10-CM | POA: Diagnosis not present

## 2012-06-12 ENCOUNTER — Ambulatory Visit
Admission: RE | Admit: 2012-06-12 | Discharge: 2012-06-12 | Disposition: A | Discharge status: Home / Self Care | Payer: Medicare Other | Source: Ambulatory Visit | Attending: Radiation Oncology | Admitting: Radiation Oncology

## 2012-06-12 DIAGNOSIS — Z510 Encounter for antineoplastic radiation therapy: Secondary | ICD-10-CM | POA: Diagnosis not present

## 2012-06-12 DIAGNOSIS — L589 Radiodermatitis, unspecified: Secondary | ICD-10-CM | POA: Diagnosis not present

## 2012-06-12 DIAGNOSIS — D0590 Unspecified type of carcinoma in situ of unspecified breast: Secondary | ICD-10-CM | POA: Diagnosis not present

## 2012-06-13 ENCOUNTER — Ambulatory Visit
Admission: RE | Admit: 2012-06-13 | Discharge: 2012-06-13 | Disposition: A | Discharge status: Home / Self Care | Payer: Medicare Other | Source: Ambulatory Visit | Attending: Radiation Oncology | Admitting: Radiation Oncology

## 2012-06-13 DIAGNOSIS — Z510 Encounter for antineoplastic radiation therapy: Secondary | ICD-10-CM | POA: Diagnosis not present

## 2012-06-13 DIAGNOSIS — L589 Radiodermatitis, unspecified: Secondary | ICD-10-CM | POA: Diagnosis not present

## 2012-06-13 DIAGNOSIS — D0590 Unspecified type of carcinoma in situ of unspecified breast: Secondary | ICD-10-CM | POA: Diagnosis not present

## 2012-06-14 ENCOUNTER — Ambulatory Visit
Admission: RE | Admit: 2012-06-14 | Discharge: 2012-06-14 | Disposition: A | Discharge status: Home / Self Care | Payer: Medicare Other | Source: Ambulatory Visit | Attending: Radiation Oncology | Admitting: Radiation Oncology

## 2012-06-14 DIAGNOSIS — Z510 Encounter for antineoplastic radiation therapy: Secondary | ICD-10-CM | POA: Diagnosis not present

## 2012-06-14 DIAGNOSIS — L589 Radiodermatitis, unspecified: Secondary | ICD-10-CM | POA: Diagnosis not present

## 2012-06-14 DIAGNOSIS — D0590 Unspecified type of carcinoma in situ of unspecified breast: Secondary | ICD-10-CM | POA: Diagnosis not present

## 2012-06-17 ENCOUNTER — Ambulatory Visit
Admission: RE | Admit: 2012-06-17 | Discharge: 2012-06-17 | Disposition: A | Discharge status: Home / Self Care | Payer: Medicare Other | Source: Ambulatory Visit | Attending: Radiation Oncology | Admitting: Radiation Oncology

## 2012-06-17 ENCOUNTER — Encounter: Payer: Self-pay | Admitting: Radiation Oncology

## 2012-06-17 VITALS — BP 132/77 | HR 73 | Temp 97.4°F | Resp 20 | Wt 128.4 lb

## 2012-06-17 DIAGNOSIS — L589 Radiodermatitis, unspecified: Secondary | ICD-10-CM | POA: Diagnosis not present

## 2012-06-17 DIAGNOSIS — Z510 Encounter for antineoplastic radiation therapy: Secondary | ICD-10-CM | POA: Diagnosis not present

## 2012-06-17 DIAGNOSIS — D0590 Unspecified type of carcinoma in situ of unspecified breast: Secondary | ICD-10-CM | POA: Diagnosis not present

## 2012-06-17 DIAGNOSIS — C50919 Malignant neoplasm of unspecified site of unspecified female breast: Secondary | ICD-10-CM

## 2012-06-17 DIAGNOSIS — C50219 Malignant neoplasm of upper-inner quadrant of unspecified female breast: Secondary | ICD-10-CM

## 2012-06-17 MED ORDER — RADIAPLEXRX EX GEL
Freq: Once | CUTANEOUS | Status: AC
  Administered 2012-06-17: 13:00:00 via TOPICAL

## 2012-06-17 MED ORDER — ALRA NON-METALLIC DEODORANT (RAD-ONC)
1.0000 "application " | Freq: Once | TOPICAL | Status: AC
  Administered 2012-06-17: 1 via TOPICAL

## 2012-06-17 NOTE — Progress Notes (Signed)
Post sim ed completed w/pt. Gave pt "Radiation and You" booklet w/all pertinent information marked and discussed, re: fatigue, skin irritation/care, nutrition, pain. Gave pt Radiaplex, [place] w/instructions for proper use. Pt verbalized understanding. 

## 2012-06-17 NOTE — Progress Notes (Signed)
Post sim ed completed, documented on post sim appt. Pt denies pain, fatigue, loss of appetite.

## 2012-06-17 NOTE — Progress Notes (Signed)
Weekly Management Note:  Site: Right breast Current Dose:  1250  cGy Projected Dose: 4250  CGy followed by right breast boost for a total of 5000 cGy in 20 sessions  Narrative: The patient is seen today for routine under treatment assessment. CBCT/MVCT images/port films were reviewed. The chart was reviewed.   No complaints today. She patient education earlier today. She'll be using Radioplex gel.  Physical Examination:  Filed Vitals:   06/17/12 1046  BP: 132/77  Pulse: 73  Temp: 97.4 F (36.3 C)  Resp: 20  .  Weight: 128 lb 6.4 oz (58.242 kg). No skin changes.  Impression: Tolerating radiation therapy well.  Plan: Continue radiation therapy as planned.

## 2012-06-18 ENCOUNTER — Ambulatory Visit: Admission: RE | Admit: 2012-06-18 | Payer: Medicare Other | Source: Ambulatory Visit

## 2012-06-19 ENCOUNTER — Ambulatory Visit: Payer: Medicare Other

## 2012-06-20 ENCOUNTER — Ambulatory Visit
Admission: RE | Admit: 2012-06-20 | Discharge: 2012-06-20 | Disposition: A | Discharge status: Home / Self Care | Payer: Medicare Other | Source: Ambulatory Visit | Attending: Radiation Oncology | Admitting: Radiation Oncology

## 2012-06-20 DIAGNOSIS — D0590 Unspecified type of carcinoma in situ of unspecified breast: Secondary | ICD-10-CM | POA: Diagnosis not present

## 2012-06-20 DIAGNOSIS — C50919 Malignant neoplasm of unspecified site of unspecified female breast: Secondary | ICD-10-CM | POA: Diagnosis not present

## 2012-06-20 DIAGNOSIS — Z510 Encounter for antineoplastic radiation therapy: Secondary | ICD-10-CM | POA: Diagnosis not present

## 2012-06-20 DIAGNOSIS — L589 Radiodermatitis, unspecified: Secondary | ICD-10-CM | POA: Diagnosis not present

## 2012-06-21 ENCOUNTER — Ambulatory Visit
Admission: RE | Admit: 2012-06-21 | Discharge: 2012-06-21 | Disposition: A | Discharge status: Home / Self Care | Payer: Medicare Other | Source: Ambulatory Visit | Attending: Radiation Oncology | Admitting: Radiation Oncology

## 2012-06-21 DIAGNOSIS — Z510 Encounter for antineoplastic radiation therapy: Secondary | ICD-10-CM | POA: Diagnosis not present

## 2012-06-21 DIAGNOSIS — D0590 Unspecified type of carcinoma in situ of unspecified breast: Secondary | ICD-10-CM | POA: Diagnosis not present

## 2012-06-21 DIAGNOSIS — L589 Radiodermatitis, unspecified: Secondary | ICD-10-CM | POA: Diagnosis not present

## 2012-06-24 ENCOUNTER — Encounter: Payer: Self-pay | Admitting: Radiation Oncology

## 2012-06-24 ENCOUNTER — Ambulatory Visit
Admission: RE | Admit: 2012-06-24 | Discharge: 2012-06-24 | Disposition: A | Discharge status: Home / Self Care | Payer: Medicare Other | Source: Ambulatory Visit | Attending: Radiation Oncology | Admitting: Radiation Oncology

## 2012-06-24 DIAGNOSIS — C50219 Malignant neoplasm of upper-inner quadrant of unspecified female breast: Secondary | ICD-10-CM

## 2012-06-24 DIAGNOSIS — C50919 Malignant neoplasm of unspecified site of unspecified female breast: Secondary | ICD-10-CM | POA: Diagnosis not present

## 2012-06-24 DIAGNOSIS — Z510 Encounter for antineoplastic radiation therapy: Secondary | ICD-10-CM | POA: Diagnosis not present

## 2012-06-24 DIAGNOSIS — D0590 Unspecified type of carcinoma in situ of unspecified breast: Secondary | ICD-10-CM | POA: Diagnosis not present

## 2012-06-24 DIAGNOSIS — L589 Radiodermatitis, unspecified: Secondary | ICD-10-CM | POA: Diagnosis not present

## 2012-06-24 NOTE — Progress Notes (Signed)
8 Fractions to right breast.  Denies any pain.  Note mild redness in her breast.

## 2012-06-24 NOTE — Progress Notes (Signed)
Weekly Management Note:  Site: Right breast Current Dose:  2000  cGy Projected Dose: 4250  cGy followed by right breast boost 750 cGy in 3 sessions  Narrative: The patient is seen today for routine under treatment assessment. CBCT/MVCT images/port films were reviewed. The chart was reviewed.   She uses Radioplex gel. No complaints today  Physical Examination: There were no vitals filed for this visit..  Weight:  . Faint erythema along the right breast with no areas of desquamation.  Impression: Tolerating radiation therapy well.  Plan: Continue radiation therapy as planned.

## 2012-06-24 NOTE — Progress Notes (Signed)
Electron beam simulation note: The patient underwent virtual simulation for her right breast electron beam boost. She was set up en face. One custom block was constructed to conform the field. A special port plan is requested. I prescribing 750 cGy in 3 sessions utilizing 15 MEV electrons. I chose the 95% isodose curve for the prescription dose. Electron beam energy was chosen based on the depth of the tumor bed as seen on her CT scan.

## 2012-06-25 ENCOUNTER — Ambulatory Visit
Admission: RE | Admit: 2012-06-25 | Discharge: 2012-06-25 | Disposition: A | Discharge status: Home / Self Care | Payer: Medicare Other | Source: Ambulatory Visit | Attending: Radiation Oncology | Admitting: Radiation Oncology

## 2012-06-25 DIAGNOSIS — L589 Radiodermatitis, unspecified: Secondary | ICD-10-CM | POA: Diagnosis not present

## 2012-06-25 DIAGNOSIS — Z510 Encounter for antineoplastic radiation therapy: Secondary | ICD-10-CM | POA: Diagnosis not present

## 2012-06-25 DIAGNOSIS — D0590 Unspecified type of carcinoma in situ of unspecified breast: Secondary | ICD-10-CM | POA: Diagnosis not present

## 2012-06-26 ENCOUNTER — Ambulatory Visit
Admission: RE | Admit: 2012-06-26 | Discharge: 2012-06-26 | Disposition: A | Discharge status: Home / Self Care | Payer: Medicare Other | Source: Ambulatory Visit | Attending: Radiation Oncology | Admitting: Radiation Oncology

## 2012-06-26 DIAGNOSIS — L589 Radiodermatitis, unspecified: Secondary | ICD-10-CM | POA: Diagnosis not present

## 2012-06-26 DIAGNOSIS — Z510 Encounter for antineoplastic radiation therapy: Secondary | ICD-10-CM | POA: Diagnosis not present

## 2012-06-26 DIAGNOSIS — D0590 Unspecified type of carcinoma in situ of unspecified breast: Secondary | ICD-10-CM | POA: Diagnosis not present

## 2012-06-27 ENCOUNTER — Ambulatory Visit
Admission: RE | Admit: 2012-06-27 | Discharge: 2012-06-27 | Disposition: A | Discharge status: Home / Self Care | Payer: Medicare Other | Source: Ambulatory Visit | Attending: Radiation Oncology | Admitting: Radiation Oncology

## 2012-06-27 DIAGNOSIS — D0590 Unspecified type of carcinoma in situ of unspecified breast: Secondary | ICD-10-CM | POA: Diagnosis not present

## 2012-06-27 DIAGNOSIS — Z510 Encounter for antineoplastic radiation therapy: Secondary | ICD-10-CM | POA: Diagnosis not present

## 2012-06-27 DIAGNOSIS — L589 Radiodermatitis, unspecified: Secondary | ICD-10-CM | POA: Diagnosis not present

## 2012-06-27 DIAGNOSIS — Z79899 Other long term (current) drug therapy: Secondary | ICD-10-CM | POA: Diagnosis not present

## 2012-06-27 DIAGNOSIS — Z1331 Encounter for screening for depression: Secondary | ICD-10-CM | POA: Diagnosis not present

## 2012-06-27 DIAGNOSIS — Z0000 Encounter for general adult medical examination without abnormal findings: Secondary | ICD-10-CM | POA: Diagnosis not present

## 2012-06-27 DIAGNOSIS — C50919 Malignant neoplasm of unspecified site of unspecified female breast: Secondary | ICD-10-CM | POA: Diagnosis not present

## 2012-06-27 DIAGNOSIS — E7800 Pure hypercholesterolemia, unspecified: Secondary | ICD-10-CM | POA: Diagnosis not present

## 2012-06-27 DIAGNOSIS — E039 Hypothyroidism, unspecified: Secondary | ICD-10-CM | POA: Diagnosis not present

## 2012-06-28 ENCOUNTER — Ambulatory Visit
Admission: RE | Admit: 2012-06-28 | Discharge: 2012-06-28 | Disposition: A | Discharge status: Home / Self Care | Payer: Medicare Other | Source: Ambulatory Visit | Attending: Radiation Oncology | Admitting: Radiation Oncology

## 2012-06-28 DIAGNOSIS — L589 Radiodermatitis, unspecified: Secondary | ICD-10-CM | POA: Diagnosis not present

## 2012-06-28 DIAGNOSIS — Z510 Encounter for antineoplastic radiation therapy: Secondary | ICD-10-CM | POA: Diagnosis not present

## 2012-06-28 DIAGNOSIS — D0590 Unspecified type of carcinoma in situ of unspecified breast: Secondary | ICD-10-CM | POA: Diagnosis not present

## 2012-07-01 ENCOUNTER — Ambulatory Visit
Admission: RE | Admit: 2012-07-01 | Discharge: 2012-07-01 | Disposition: A | Discharge status: Home / Self Care | Payer: Medicare Other | Source: Ambulatory Visit | Attending: Radiation Oncology | Admitting: Radiation Oncology

## 2012-07-01 ENCOUNTER — Encounter: Payer: Self-pay | Admitting: Radiation Oncology

## 2012-07-01 VITALS — BP 144/86 | HR 70 | Temp 97.5°F | Resp 20 | Wt 128.3 lb

## 2012-07-01 DIAGNOSIS — Z510 Encounter for antineoplastic radiation therapy: Secondary | ICD-10-CM | POA: Diagnosis not present

## 2012-07-01 DIAGNOSIS — L589 Radiodermatitis, unspecified: Secondary | ICD-10-CM | POA: Diagnosis not present

## 2012-07-01 DIAGNOSIS — C50219 Malignant neoplasm of upper-inner quadrant of unspecified female breast: Secondary | ICD-10-CM

## 2012-07-01 DIAGNOSIS — D0590 Unspecified type of carcinoma in situ of unspecified breast: Secondary | ICD-10-CM | POA: Diagnosis not present

## 2012-07-01 MED ORDER — BIAFINE EX EMUL
Freq: Two times a day (BID) | CUTANEOUS | Status: DC
  Administered 2012-07-01: 11:00:00 via TOPICAL

## 2012-07-01 NOTE — Addendum Note (Signed)
Encounter addended by: Glennie Hawk, RN on: 07/01/2012 10:53 AM<BR>     Documentation filed: Inpatient MAR, Orders

## 2012-07-01 NOTE — Progress Notes (Signed)
Pt denies pain, fatigue, loss of appetite. She c/o itchiness, has radiation dermatitis. Gave pt Biafine lotion w/instructions for use.

## 2012-07-01 NOTE — Progress Notes (Signed)
Weekly Management Note:  Site: Right breast Current Dose:  3250  cGy Projected Dose: 4250 cGy  followed by a boost of 750 cGy in 3 sessions.  Narrative: The patient is seen today for routine under treatment assessment. CBCT/MVCT images/port films were reviewed. The chart was reviewed.   She does have more pruritus of her skin along her upper right breast. She was given Biafine cream to use  Physical Examination:  Filed Vitals:   07/01/12 1028  BP: 144/86  Pulse: 70  Temp: 97.5 F (36.4 C)  Resp: 20  .  Weight: 128 lb 4.8 oz (58.196 kg). There is mild to moderate erythema the skin along the right breast with a papular eruption along the superior aspect of the breast, medial greater the lateral. No areas of dry desquamation.  Impression: Tolerating radiation therapy well. She does have mild to moderate radiation dermatitis as expected. We will change her to Biafine cream. If her pruritus persists then she can use hydrocortisone cream.  Plan: Continue radiation therapy as planned.

## 2012-07-02 ENCOUNTER — Ambulatory Visit
Admission: RE | Admit: 2012-07-02 | Discharge: 2012-07-02 | Disposition: A | Discharge status: Home / Self Care | Payer: Medicare Other | Source: Ambulatory Visit | Attending: Radiation Oncology | Admitting: Radiation Oncology

## 2012-07-02 DIAGNOSIS — L589 Radiodermatitis, unspecified: Secondary | ICD-10-CM | POA: Diagnosis not present

## 2012-07-02 DIAGNOSIS — D0590 Unspecified type of carcinoma in situ of unspecified breast: Secondary | ICD-10-CM | POA: Diagnosis not present

## 2012-07-02 DIAGNOSIS — Z510 Encounter for antineoplastic radiation therapy: Secondary | ICD-10-CM | POA: Diagnosis not present

## 2012-07-03 ENCOUNTER — Ambulatory Visit
Admission: RE | Admit: 2012-07-03 | Discharge: 2012-07-03 | Disposition: A | Discharge status: Home / Self Care | Payer: Medicare Other | Source: Ambulatory Visit | Attending: Radiation Oncology | Admitting: Radiation Oncology

## 2012-07-03 DIAGNOSIS — D0590 Unspecified type of carcinoma in situ of unspecified breast: Secondary | ICD-10-CM | POA: Diagnosis not present

## 2012-07-03 DIAGNOSIS — Z510 Encounter for antineoplastic radiation therapy: Secondary | ICD-10-CM | POA: Diagnosis not present

## 2012-07-03 DIAGNOSIS — L589 Radiodermatitis, unspecified: Secondary | ICD-10-CM | POA: Diagnosis not present

## 2012-07-04 ENCOUNTER — Ambulatory Visit: Payer: Medicare Other

## 2012-07-05 ENCOUNTER — Ambulatory Visit
Admission: RE | Admit: 2012-07-05 | Discharge: 2012-07-05 | Disposition: A | Discharge status: Home / Self Care | Payer: Medicare Other | Source: Ambulatory Visit | Attending: Radiation Oncology | Admitting: Radiation Oncology

## 2012-07-05 DIAGNOSIS — C50919 Malignant neoplasm of unspecified site of unspecified female breast: Secondary | ICD-10-CM | POA: Diagnosis not present

## 2012-07-05 DIAGNOSIS — D0590 Unspecified type of carcinoma in situ of unspecified breast: Secondary | ICD-10-CM | POA: Diagnosis not present

## 2012-07-05 DIAGNOSIS — Z510 Encounter for antineoplastic radiation therapy: Secondary | ICD-10-CM | POA: Diagnosis not present

## 2012-07-05 DIAGNOSIS — L589 Radiodermatitis, unspecified: Secondary | ICD-10-CM | POA: Diagnosis not present

## 2012-07-08 ENCOUNTER — Encounter: Payer: Self-pay | Admitting: Radiation Oncology

## 2012-07-08 ENCOUNTER — Ambulatory Visit
Admission: RE | Admit: 2012-07-08 | Discharge: 2012-07-08 | Disposition: A | Discharge status: Home / Self Care | Payer: Medicare Other | Source: Ambulatory Visit | Attending: Radiation Oncology | Admitting: Radiation Oncology

## 2012-07-08 VITALS — BP 157/92 | HR 69 | Temp 97.4°F | Resp 20 | Wt 128.8 lb

## 2012-07-08 DIAGNOSIS — Z510 Encounter for antineoplastic radiation therapy: Secondary | ICD-10-CM | POA: Diagnosis not present

## 2012-07-08 DIAGNOSIS — L589 Radiodermatitis, unspecified: Secondary | ICD-10-CM | POA: Diagnosis not present

## 2012-07-08 DIAGNOSIS — D0590 Unspecified type of carcinoma in situ of unspecified breast: Secondary | ICD-10-CM | POA: Diagnosis not present

## 2012-07-08 DIAGNOSIS — C50919 Malignant neoplasm of unspecified site of unspecified female breast: Secondary | ICD-10-CM

## 2012-07-08 NOTE — Progress Notes (Signed)
Weekly Management Note:  Site: Right Current Dose:  4250  cGy Projected Dose: 4250  cGy followed by boost of 750 cGy 3 sessions  Narrative: The patient is seen today for routine under treatment assessment. CBCT/MVCT images/port films were reviewed. The chart was reviewed.   She is without complaints today. She uses Biafine cream. She tells me she will need to Mr. treatment tomorrow because of a previous obligation.  Physical Examination:  Filed Vitals:   07/08/12 1028  BP: 157/92  Pulse: 69  Temp: 97.4 F (36.3 C)  Resp: 20  .  Weight: 128 lb 12.8 oz (58.423 kg). There is erythematous skin, particularly the inframammary region and upper breast. The erythema/dermatitis is papular.  Impression: Tolerating radiation therapy well. She'll finish her radiation therapy this Friday.  Plan: Continue radiation therapy as planned. One-month followup visit after completing her radiation therapy this Friday.

## 2012-07-08 NOTE — Progress Notes (Signed)
Patient here rad txs rt breast  17/20 skin dermatitis on right breast area, uses biafine cream, not itching stated , no pain, but high b/p, takes norvasc at night,off cholesterol med 10:28 AM '

## 2012-07-09 ENCOUNTER — Ambulatory Visit: Payer: Medicare Other

## 2012-07-10 ENCOUNTER — Ambulatory Visit: Payer: Medicare Other

## 2012-07-10 ENCOUNTER — Ambulatory Visit
Admission: RE | Admit: 2012-07-10 | Discharge: 2012-07-10 | Disposition: A | Discharge status: Home / Self Care | Payer: Medicare Other | Source: Ambulatory Visit | Attending: Radiation Oncology | Admitting: Radiation Oncology

## 2012-07-10 DIAGNOSIS — D0590 Unspecified type of carcinoma in situ of unspecified breast: Secondary | ICD-10-CM | POA: Diagnosis not present

## 2012-07-10 DIAGNOSIS — L589 Radiodermatitis, unspecified: Secondary | ICD-10-CM | POA: Diagnosis not present

## 2012-07-10 DIAGNOSIS — Z510 Encounter for antineoplastic radiation therapy: Secondary | ICD-10-CM | POA: Diagnosis not present

## 2012-07-11 ENCOUNTER — Ambulatory Visit: Payer: Medicare Other

## 2012-07-11 ENCOUNTER — Ambulatory Visit
Admission: RE | Admit: 2012-07-11 | Discharge: 2012-07-11 | Disposition: A | Discharge status: Home / Self Care | Payer: Medicare Other | Source: Ambulatory Visit | Attending: Radiation Oncology | Admitting: Radiation Oncology

## 2012-07-11 DIAGNOSIS — Z510 Encounter for antineoplastic radiation therapy: Secondary | ICD-10-CM | POA: Diagnosis not present

## 2012-07-11 DIAGNOSIS — L589 Radiodermatitis, unspecified: Secondary | ICD-10-CM | POA: Diagnosis not present

## 2012-07-11 DIAGNOSIS — D0590 Unspecified type of carcinoma in situ of unspecified breast: Secondary | ICD-10-CM | POA: Diagnosis not present

## 2012-07-12 ENCOUNTER — Ambulatory Visit
Admission: RE | Admit: 2012-07-12 | Discharge: 2012-07-12 | Disposition: A | Discharge status: Home / Self Care | Payer: Medicare Other | Source: Ambulatory Visit | Attending: Radiation Oncology | Admitting: Radiation Oncology

## 2012-07-12 ENCOUNTER — Ambulatory Visit: Payer: Medicare Other

## 2012-07-12 DIAGNOSIS — D0590 Unspecified type of carcinoma in situ of unspecified breast: Secondary | ICD-10-CM | POA: Diagnosis not present

## 2012-07-12 DIAGNOSIS — Z510 Encounter for antineoplastic radiation therapy: Secondary | ICD-10-CM | POA: Diagnosis not present

## 2012-07-12 DIAGNOSIS — L589 Radiodermatitis, unspecified: Secondary | ICD-10-CM | POA: Diagnosis not present

## 2012-07-15 ENCOUNTER — Encounter: Payer: Self-pay | Admitting: Radiation Oncology

## 2012-07-15 ENCOUNTER — Ambulatory Visit: Payer: Medicare Other

## 2012-07-15 NOTE — Progress Notes (Signed)
[facility] Radiation Oncology End of Treatment Note  Name:Sarah [name]  Date: 07/15/2012 ZOX:[id number] DOB:02/16/1933   Status:outpatient    CC: Ginette Otto, MD  Dr. Claud Kelp  REFERRING PHYSICIAN:  Dr. Claud Kelp   DIAGNOSIS:  Stage 0 (Tis N0 M0) DCIS of the right breast  INDICATION FOR TREATMENT: Curative   TREATMENT DATES: 06/11/2012 2 07/12/2012                          SITE/DOSE:  Right breast 4250 cGy 17 sessions, right breast boost 750 cGy 3 sessions                          BEAMS/ENERGY:  6 MV photons tangential fields to the right breast. 15 MEV electrons, right breast boost                NARRATIVE:   The patient tolerated her treatment quite well with only mild to moderate radiation dermatitis by completion of therapy. She uses Biafine cream and also hydrocortisone cream when necessary.                         PLAN: Routine followup in one month. Patient instructed to call if questions or worsening complaints in interim.

## 2012-07-16 ENCOUNTER — Ambulatory Visit: Payer: Medicare Other

## 2012-07-17 ENCOUNTER — Ambulatory Visit: Payer: Medicare Other

## 2012-07-18 ENCOUNTER — Ambulatory Visit: Payer: Medicare Other

## 2012-07-19 ENCOUNTER — Ambulatory Visit: Payer: Medicare Other

## 2012-07-22 ENCOUNTER — Ambulatory Visit: Payer: Medicare Other

## 2012-07-23 ENCOUNTER — Ambulatory Visit: Payer: Medicare Other

## 2012-07-24 ENCOUNTER — Ambulatory Visit: Payer: Medicare Other

## 2012-07-25 ENCOUNTER — Ambulatory Visit: Payer: Medicare Other

## 2012-07-25 DIAGNOSIS — E7800 Pure hypercholesterolemia, unspecified: Secondary | ICD-10-CM | POA: Diagnosis not present

## 2012-07-25 DIAGNOSIS — Z79899 Other long term (current) drug therapy: Secondary | ICD-10-CM | POA: Diagnosis not present

## 2012-07-25 DIAGNOSIS — E039 Hypothyroidism, unspecified: Secondary | ICD-10-CM | POA: Diagnosis not present

## 2012-08-01 ENCOUNTER — Ambulatory Visit (HOSPITAL_BASED_OUTPATIENT_CLINIC_OR_DEPARTMENT_OTHER): Payer: Medicare Other | Admitting: Oncology

## 2012-08-01 VITALS — BP 159/88 | HR 71 | Temp 98.0°F | Resp 20 | Ht 64.5 in | Wt 128.6 lb

## 2012-08-01 DIAGNOSIS — C50919 Malignant neoplasm of unspecified site of unspecified female breast: Secondary | ICD-10-CM

## 2012-08-01 DIAGNOSIS — Z170 Estrogen receptor positive status [ER+]: Secondary | ICD-10-CM

## 2012-08-01 DIAGNOSIS — D0590 Unspecified type of carcinoma in situ of unspecified breast: Secondary | ICD-10-CM

## 2012-08-01 NOTE — Progress Notes (Signed)
ID: Sarah [name]   DOB: 1932/11/06  MR#: [id number]  WUJ#:[id number]  PCP: Sarah Otto, MD GYN:  SUClaud [name] OTHER MD: Sarah [name], Sarah [name]   HISTORY OF PRESENT ILLNESS: The patient had bilateral diagnostic mammography at [facility] 07/11/2011. This showed some calcifications in the right breast, which seemed a little bit more prominent than prior. In the left breast there was an area of possible architectural distortion. However left breast ultrasound the same day showed no abnormality. 6 month followup was suggested, and on 02/28/2012 the patient again had bilateral diagnostic mammography, now with right ultrasonography. The microcalcifications in the right breast appeared increased. Ultrasound showed a hypoechoic lesion measuring 5 mm, with no associated shadowing. This had been previously noted and appeared unchanged. Anterior to that there was a small hypoechoic mass measuring 4 mm in diameter. There was felt to be suspicious, and on 03/14/2012 the patient underwent biopsy of the right breast mass, showing ([id number]) ductal carcinoma in situ, intermediate grade, estrogen receptor 100% and progesterone receptor 100% positive.  Bilateral breast MRI was obtained 03/26/2012. This showed only post biopsy changes in the right breast, associated with an area of non-masslike enhancement measuring 2.4 cm. The left breast was unremarkable, and there was no enlarged axillary or internal mammary adenopathy noted. Accordingly on 04/09/2012 the patient underwent right lumpectomy, the pathology ([id number]) showing ductal carcinoma in situ measuring 2.0 cm, grade 2, with negative margins, the closest being 0.3 cm. The patient's subsequent history is as detailed below.  INTERVAL HISTORY: Sarah [name] returns today for followup of her breast cancer. Since her last visit here she completed her radiation treatments. She continues to be very busy trying to settle her husband's estate  REVIEW OF  SYSTEMS: She had no unusual fatigue with radiation, and she continues to do all her usual activities. She did have some skin changes with minimal dry desquamation. She tells me that is resolving. She has some macular degeneration and occasional has problems with blurred vision. She also has a palpable lump at the site of the breast scar. Otherwise a detailed review of systems today was noncontributory  PAST MEDICAL HISTORY: Past Medical History  Diagnosis Date  . PONV (postoperative nausea and vomiting)   . Hypertension   . Hyperlipemia   . Arthritis   . Cancer     breast  . Wears glasses   . Wears partial dentures     partial upper  . HOH (hard of hearing)   . Breast cancer 03/14/12    bx=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,ER/PR=+,upper inner quad  . Cataract   . Hypothyroidism   . Pancreatic cyst     benign  . Allergy     codeine, thiazides  . Anxiety     new dx  . Glaucoma     laser treated years ago  . Vertigo     PAST SURGICAL HISTORY: Past Surgical History  Procedure Laterality Date  . Tonsillectomy    . Total knee arthroplasty  08/16/2011    Procedure: TOTAL KNEE ARTHROPLASTY;  Surgeon: Loreta Ave, MD;  Location: [facility] OR;  Service: Orthopedics;  Laterality: Right;  . Joint replacement  2013    rt total knee  . Joint replacement  1995    lt total knee  . Breast surgery  1992    lumpectomy-lt  . Colonoscopy    . Eye surgery      bilateral cataract removal  . Partial mastectomy with needle localization  04/09/2012  Procedure: PARTIAL MASTECTOMY WITH NEEDLE LOCALIZATION;  Surgeon: Ernestene Mention, MD;  Location: [facility];  Service: General;  Laterality: Right;  . Cataract extraction      b/l  . Abdominal hysterectomy  1966    1/2 ovary left in     FAMILY HISTORY Family History  Problem Relation Age of Onset  . Heart disease Father   . Cancer Maternal Aunt     stomach   the patient never met her father, does not  know his cause of death or age at death, but thinks it may have been of likely a heart attack based on comments in the obituary. The patient's mother died at the age of 47 from a rare blood disorder. The patient does not recall the name of the problem. The patient has no full siblings. She may have a half-brother. There is no history of breast or ovarian cancer in the family.  GYNECOLOGIC HISTORY: Menarche age 26, first live birth age 35, the patient is GX P2. She had her hysterectomy at age 60 and took hormone replacement until approximately 1973.  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 She is a widow, her husband died of from acute myeloid leukemia approximately a year ago (he only lived 6 weeks from the time of diagnosis). Son Sarah [name] lives in [place] and is a Sport and exercise psychologist. Daughter Sarah [name] lives in [place] and is a probation and prolapse as her. The patient has 1 grandchild. She attends a [employer].    ADVANCED DIRECTIVES: In place; the patient tells me her daughter Sarah [name] is her healthcare power of attorney. She can be reached at [phone]. The patient also wanted [place] to have her son's phone number. It is [phone].  HEALTH MAINTENANCE: History  Substance Use Topics  . Smoking status: Never Smoker   . Smokeless tobacco: Not on file  . Alcohol Use: No     Colonoscopy: 2013/[facility]   PAP: s/p hysterectomy  Bone density: 2013/ "normal"  Lipid panel:  Allergies  Allergen Reactions  . Codeine Other (See Comments)    Unknown reaction  . Thiazide-Type Diuretics Other (See Comments)    Blurry vision    Current Outpatient Prescriptions  Medication Sig Dispense Refill  . amLODipine (NORVASC) 5 MG tablet Take 5 mg by mouth 2 (two) times daily.      Marland Kitchen aspirin 81 MG tablet Take 81 mg by mouth daily.      Marland Kitchen atorvastatin (LIPITOR) 10 MG tablet Daily.      . Cholecalciferol (D3-1000 PO) Take by mouth daily.      Marland Kitchen emollient (BIAFINE) cream Apply topically 2  (two) times daily.      . hyaluronate sodium (RADIAPLEXRX) GEL Apply topically 2 (two) times daily.      Marland Kitchen levothyroxine (SYNTHROID, LEVOTHROID) 50 MCG tablet Take 50 mcg by mouth daily.      . Lutein 20 MG TABS Take by mouth daily.      . Misc Natural Products (GRAPE SEED COMPLEX PO) Take by mouth 2 (two) times daily.      . Multiple Vitamins-Minerals (PRESERVISION AREDS PO) Take by mouth daily.      Marland Kitchen zolpidem (AMBIEN) 5 MG tablet Take 5 mg by mouth at bedtime as needed.       No current facility-administered medications for this visit.    OBJECTIVE: Middle-aged white woman in no acute distress Filed Vitals:   08/01/12 1502  BP: 159/88  Pulse: 71  Temp: 98 F (  36.7 C)  Resp: 20     Body mass index is 21.74 kg/(m^2).    ECOG FS: 0  Sclerae unicteric Oropharynx clear No cervical or supraclavicular adenopathy Lungs no rales or rhonchi Heart regular rate and rhythm Abd benign MSK no focal spinal tenderness, no peripheral edema Neuro: nonfocal Breasts: The right breast is status post lumpectomy and radiation. There is minimal hyperpigmentation, and no significant desquamation. There is some induration beneath the scar, which is expected. The right axilla is benign. The left breast is unremarkable.   LAB RESULTS: Lab Results  Component Value Date   WBC 5.0 04/25/2012   NEUTROABS 3.3 04/25/2012   HGB 14.6 04/25/2012   HCT 43.1 04/25/2012   MCV 90.0 04/25/2012   PLT 187 04/25/2012      Chemistry      Component Value Date/Time   NA 142 04/25/2012 1606   NA 142 04/05/2012 1350   K 3.6 04/25/2012 1606   K 4.1 04/05/2012 1350   CL 106 04/25/2012 1606   CL 104 04/05/2012 1350   CO2 28 04/25/2012 1606   CO2 28 04/05/2012 1350   BUN 21.0 04/25/2012 1606   BUN 19 04/05/2012 1350   CREATININE 0.8 04/25/2012 1606   CREATININE 0.51 04/05/2012 1350      Component Value Date/Time   CALCIUM 10.1 04/25/2012 1606   CALCIUM 10.4 04/05/2012 1350   ALKPHOS 85 04/25/2012 1606   ALKPHOS 77  04/05/2012 1350   AST 17 04/25/2012 1606   AST 20 04/05/2012 1350   ALT 13 04/25/2012 1606   ALT 14 04/05/2012 1350   BILITOT 0.67 04/25/2012 1606   BILITOT 0.4 04/05/2012 1350       No results found for this basename: LABCA2    No components found with this basename: LABCA125    No results found for this basename: INR,  in the last 168 hours  Urinalysis    Component Value Date/Time   COLORURINE YELLOW 04/09/2012 1202   APPEARANCEUR CLEAR 04/09/2012 1202   LABSPEC 1.011 04/09/2012 1202   PHURINE 7.0 04/09/2012 1202   GLUCOSEU NEGATIVE 04/09/2012 1202   HGBUR NEGATIVE 04/09/2012 1202   BILIRUBINUR NEGATIVE 04/09/2012 1202   KETONESUR NEGATIVE 04/09/2012 1202   PROTEINUR NEGATIVE 04/09/2012 1202   UROBILINOGEN 0.2 04/09/2012 1202   NITRITE NEGATIVE 04/09/2012 1202   LEUKOCYTESUR TRACE* 04/09/2012 1202    STUDIES: No results found.  ASSESSMENT: 79 y.o. [place] woman status post right lumpectomy 04/09/2012 for a ductal carcinoma in situ, grade 2, estrogen and progesterone receptor positive, both at 100%, with negative margins.  (1) completed adjuvant radiation 02//20/2014  PLAN:  Fraida had a great deal of difficulty initially trying to decide whether or not to go on antiestrogen to. She very much would prefer not to, but was concerned regarding the risk of recurrence or of developing in you cancer. Today we went over her situation in detail, and she understands that the risk of recurrence for this particular cancer is very low, likely in the 5% range, and if it recurs it can only recur in the same breast. That could be successfully treated with mastectomy. Antiestrogen she would reduce her risk of developing a new breast cancer, but that risk is in the 1% per year range and if she lives in other 15-20 years the benefit would be a risk reduction of 10% or less.  We went again over the possible toxicities side effects and complications of treatment particularly the concerns  regarding blood clots  with tamoxifen and osteoporosis with the aromatase inhibitors. After a full discussion she was very clear in her mind and very satisfied with her decision that she did not want to take anti-estrogens either for treatment or prevention. She understands she is not putting her life at risk by making this choice, and indeed the majority of my patients with ductal carcinoma in situ prefer not to take antiestrogen meds.  Accordingly I am comfortable releasing her back to her primary care physician, Dr. Pete Glatter. She understands all she will need is a yearly physician breast exam and yearly mammography. Of course we will be glad to see her again at any point in the future if the need were to arise. I have also encouraged her to consider volunteering here when she has a little bit more time.  MAGRINAT,GUSTAV C    08/01/2012

## 2012-08-05 ENCOUNTER — Telehealth: Payer: Self-pay | Admitting: Oncology

## 2012-08-05 NOTE — Telephone Encounter (Signed)
Fax letter to Dr. Merlene Laughter office from Dr. Darnelle Catalan

## 2012-08-06 ENCOUNTER — Encounter: Payer: Self-pay | Admitting: Oncology

## 2012-08-13 DIAGNOSIS — Z961 Presence of intraocular lens: Secondary | ICD-10-CM | POA: Diagnosis not present

## 2012-08-13 DIAGNOSIS — H04129 Dry eye syndrome of unspecified lacrimal gland: Secondary | ICD-10-CM | POA: Diagnosis not present

## 2012-08-13 DIAGNOSIS — H18519 Endothelial corneal dystrophy, unspecified eye: Secondary | ICD-10-CM | POA: Diagnosis not present

## 2012-08-13 DIAGNOSIS — H40019 Open angle with borderline findings, low risk, unspecified eye: Secondary | ICD-10-CM | POA: Diagnosis not present

## 2012-08-14 ENCOUNTER — Ambulatory Visit
Admission: RE | Admit: 2012-08-14 | Discharge: 2012-08-14 | Disposition: A | Discharge status: Home / Self Care | Payer: Medicare Other | Source: Ambulatory Visit | Attending: Radiation Oncology | Admitting: Radiation Oncology

## 2012-08-14 VITALS — BP 159/86 | HR 76 | Temp 97.5°F | Wt 125.7 lb

## 2012-08-14 DIAGNOSIS — C50911 Malignant neoplasm of unspecified site of right female breast: Secondary | ICD-10-CM

## 2012-08-14 NOTE — Progress Notes (Signed)
Sarah [name] is here for follow up after 20 fractions to her right breast.  She denies pain and fatigue.  She does have some hyperpigmentation on the top portion of her right breast.

## 2012-08-14 NOTE — Progress Notes (Signed)
Followup note:  The patient returns today approximately 1 month following completion of hypo-fractionated radiation therapy in the management of her DCIS of the right breast. She is doing well and is without complaints today. She tells me she saw Dr. Darnelle Catalan 2 weeks ago and she declines adjuvant hormone therapy. She tells me that she sees Dr. Derrell Lolling again in May and Dr. Darnelle Catalan again in one year. She has mammography at [facility].  Physical examination: She looks well. Wt Readings from Last 3 Encounters:  08/14/12 125 lb 11.2 oz (57.017 kg)  08/01/12 128 lb 9.6 oz (58.333 kg)  07/08/12 128 lb 12.8 oz (58.423 kg)   Temp Readings from Last 3 Encounters:  08/14/12 97.5 F (36.4 C)   08/01/12 98 F (36.7 C) Oral  07/08/12 97.4 F (36.3 C) Oral   BP Readings from Last 3 Encounters:  08/14/12 159/86  08/01/12 159/88  07/08/12 157/92   Pulse Readings from Last 3 Encounters:  08/14/12 76  08/01/12 71  07/08/12 69   Head and neck examination: Grossly unremarkable. Nodes: Without palpable cervical, supraclavicular, or axillary lymphadenopathy. Breasts: There is residual hyperpigmentation of the skin along with patchy dry desquamation along the right breast. There is moderate thickening superiorly along her lumpectomy bed. No dominant masses are appreciated. Left breast without masses or lesions. Extremities: Without edema.  Impression: Satisfactory progress.  Plan: She'll maintain her followup with Dr. Derrell Lolling and May and see Dr. Darnelle Catalan in one year. She had a baseline right breast mammogram later this year at [facility]. I assume if this can be scheduled by Dr. Derrell Lolling.

## 2012-08-23 DIAGNOSIS — M779 Enthesopathy, unspecified: Secondary | ICD-10-CM | POA: Diagnosis not present

## 2012-08-23 DIAGNOSIS — B351 Tinea unguium: Secondary | ICD-10-CM | POA: Diagnosis not present

## 2012-09-09 ENCOUNTER — Ambulatory Visit (INDEPENDENT_AMBULATORY_CARE_PROVIDER_SITE_OTHER): Payer: Medicare Other | Admitting: Ophthalmology

## 2012-09-24 ENCOUNTER — Ambulatory Visit (INDEPENDENT_AMBULATORY_CARE_PROVIDER_SITE_OTHER): Payer: Medicare Other | Admitting: Ophthalmology

## 2012-09-24 DIAGNOSIS — I10 Essential (primary) hypertension: Secondary | ICD-10-CM | POA: Diagnosis not present

## 2012-09-24 DIAGNOSIS — H3530 Unspecified macular degeneration: Secondary | ICD-10-CM

## 2012-09-24 DIAGNOSIS — H43819 Vitreous degeneration, unspecified eye: Secondary | ICD-10-CM

## 2012-09-24 DIAGNOSIS — H35039 Hypertensive retinopathy, unspecified eye: Secondary | ICD-10-CM

## 2012-09-26 DIAGNOSIS — I10 Essential (primary) hypertension: Secondary | ICD-10-CM | POA: Diagnosis not present

## 2012-10-15 ENCOUNTER — Encounter (INDEPENDENT_AMBULATORY_CARE_PROVIDER_SITE_OTHER): Payer: Self-pay | Admitting: General Surgery

## 2012-10-15 ENCOUNTER — Ambulatory Visit (INDEPENDENT_AMBULATORY_CARE_PROVIDER_SITE_OTHER): Payer: Medicare Other | Admitting: General Surgery

## 2012-10-15 VITALS — BP 128/70 | HR 62 | Temp 98.0°F | Resp 18 | Ht 64.0 in | Wt 125.0 lb

## 2012-10-15 DIAGNOSIS — C50211 Malignant neoplasm of upper-inner quadrant of right female breast: Secondary | ICD-10-CM

## 2012-10-15 DIAGNOSIS — C50219 Malignant neoplasm of upper-inner quadrant of unspecified female breast: Secondary | ICD-10-CM | POA: Diagnosis not present

## 2012-10-15 NOTE — Progress Notes (Signed)
Patient ID: Sarah [name], female   DOB: Dec 09, 1932, 79 y.o.   MRN: [id number] History: This patient returns for followup of her right breast cancer. On 04/09/2012 she underwent right partial mastectomy. Pathology showed intermediate grade DCIS, receptor positive, negative margins, multiple foci. Pre-radiotherapy mammogram showed a density at the mastectomy site and some benign calcifications. Dr. Dayton Scrape supervised radiation therapy and she recovered from that well. She has had several conversations with Dr. Darnelle Catalan and elected not to take antiestrogen therapy. She is now being followed by Dr. Pete Glatter and myself. No new health problems. Feels good about her breast and cosmesis.  Exam: Patient looks well. Good spirits. Healthy. Right breast shows incision superiorly healed nicely. No hematoma or seroma. Excellent cosmesis. No axillary mass. No adenopathy. Lungs clear to auscultation Heart regular rate and rhythm, no murmur, no ectopy  Assessment: Intermediate grade DCIS right breast, superior pole, receptor positive. Uneventful recovery following partial mastectomy and adjuvant radiation therapy  Plan: Bilateral mammograms in November Return to see me in December   Marquon Alcala M. Derrell Lolling, M.D., [facility] General and Minimally invasive Surgery Breast and Colorectal Surgery Office:   [phone] Pager:   [phone]

## 2012-10-15 NOTE — Patient Instructions (Signed)
Examination of your breast and all of the regional lymph nodes today is normal. There is no evidence of cancer.  Be sure to get bilateral mammograms in November, and see Dr. Derrell Lolling in December for a 1 year check up.

## 2012-10-16 DIAGNOSIS — IMO0002 Reserved for concepts with insufficient information to code with codable children: Secondary | ICD-10-CM | POA: Diagnosis not present

## 2012-10-17 DIAGNOSIS — D239 Other benign neoplasm of skin, unspecified: Secondary | ICD-10-CM | POA: Diagnosis not present

## 2012-10-17 DIAGNOSIS — I8310 Varicose veins of unspecified lower extremity with inflammation: Secondary | ICD-10-CM | POA: Diagnosis not present

## 2012-10-17 DIAGNOSIS — L820 Inflamed seborrheic keratosis: Secondary | ICD-10-CM | POA: Diagnosis not present

## 2012-10-17 DIAGNOSIS — L821 Other seborrheic keratosis: Secondary | ICD-10-CM | POA: Diagnosis not present

## 2012-10-17 DIAGNOSIS — D2360 Other benign neoplasm of skin of unspecified upper limb, including shoulder: Secondary | ICD-10-CM | POA: Diagnosis not present

## 2013-01-24 ENCOUNTER — Encounter (INDEPENDENT_AMBULATORY_CARE_PROVIDER_SITE_OTHER): Payer: Medicare Other | Admitting: General Surgery

## 2013-02-27 ENCOUNTER — Ambulatory Visit (INDEPENDENT_AMBULATORY_CARE_PROVIDER_SITE_OTHER): Payer: Medicare Other | Admitting: General Surgery

## 2013-02-27 ENCOUNTER — Encounter (INDEPENDENT_AMBULATORY_CARE_PROVIDER_SITE_OTHER): Payer: Self-pay | Admitting: General Surgery

## 2013-02-27 ENCOUNTER — Encounter (INDEPENDENT_AMBULATORY_CARE_PROVIDER_SITE_OTHER): Payer: Self-pay

## 2013-02-27 VITALS — BP 128/82 | HR 96 | Temp 98.4°F | Resp 16 | Ht 64.5 in | Wt 126.4 lb

## 2013-02-27 DIAGNOSIS — C50211 Malignant neoplasm of upper-inner quadrant of right female breast: Secondary | ICD-10-CM

## 2013-02-27 DIAGNOSIS — Z23 Encounter for immunization: Secondary | ICD-10-CM | POA: Diagnosis not present

## 2013-02-27 DIAGNOSIS — C50219 Malignant neoplasm of upper-inner quadrant of unspecified female breast: Secondary | ICD-10-CM

## 2013-02-27 NOTE — Patient Instructions (Signed)
The thickened,  lumpy area in your right breast at the 4:00 position feels more like radiation change than recurrent cancer. The left breast exam is normal.  Proceed with bilateral diagnostic mammograms at [facility] on October 14. If the imaging studies are normal there nothing further needs to be done.  Keep your appt. with me in December for followup.  I will be happy to see you sooner if the imaging studies show a problem.

## 2013-02-27 NOTE — Progress Notes (Signed)
Patient ID: Sarah [name], female   DOB: [birth year], 79 y.o.   MRN: [id number]  History: This patient came in a little bit early because she feels a thickened area and a lumpy area in her right breast, lower inner quadrant, which is remote from her lumpectomy site at the 12:00 position. On 04/03/2012 she underwent right partial mastectomy. Final pathology showed receptor-positive DCIS. She had adjuvant radiation therapy. She was offered antiestrogen therapy but declined. She was discharged from routine followup with Dr. Darnelle Catalan and referred back to her PCP, Dr. Pete Glatter. She expresses interest in continuing followup with me on an annual basis. She is scheduled for bilateral mammograms of October 14, next week, but wanted me to examine her first. She states that she has noticed this thickened area for about 3 months. She has a little bit of aching in her breast diffusely but no point tenderness.  ROS: She is very healthy. 10 system review of systems is negative except as described above.  Exam:   patient looks well. Thin. Very fit for her age Neck: No adenopathy or mass. No JVD. Lungs: Clear to auscultation bilaterally Heart: Regular rate and rhythm. No murmur. No ectopy Breast. Right breast reveals a well-healed lumpectomy scar at 12:00 position. Chronic radiation changes with dimpling but very little erythema. Skin is healthy and intact. In the lower inner quadrant, 4:00 position. There is a focal area of thickening which feels like radiation change. Does not feel like fluid. No axillary mass. Left breast exam is unremarkable.  Assessment: Ductal carcinoma in situ right breast, receptor positive. Doubt recurrence one year following right partial mastectomy and adjuvant radiation therapy Focal thickening right breast, 4:00 position. Suspect benign radiation change. Further evaluation indicated.  Plan: She will get her bilateral diagnostic mammograms and possible right breast ultrasound on  03/04/2013. She will keep her appointment with me in December.  I tried to reassure her that my clinical impression was that this was most likely benign radiation change. We will know better after her imaging studies.     Angelia Mould. Derrell Lolling, M.D., [facility] General and Minimally invasive Surgery Breast and Colorectal Surgery Office:   [phone] Pager:   [phone]

## 2013-03-04 DIAGNOSIS — N63 Unspecified lump in unspecified breast: Secondary | ICD-10-CM | POA: Diagnosis not present

## 2013-03-27 DIAGNOSIS — E039 Hypothyroidism, unspecified: Secondary | ICD-10-CM | POA: Diagnosis not present

## 2013-03-27 DIAGNOSIS — G479 Sleep disorder, unspecified: Secondary | ICD-10-CM | POA: Diagnosis not present

## 2013-03-27 DIAGNOSIS — I10 Essential (primary) hypertension: Secondary | ICD-10-CM | POA: Diagnosis not present

## 2013-05-08 ENCOUNTER — Ambulatory Visit (INDEPENDENT_AMBULATORY_CARE_PROVIDER_SITE_OTHER): Payer: Medicare Other | Admitting: General Surgery

## 2013-05-08 ENCOUNTER — Encounter (INDEPENDENT_AMBULATORY_CARE_PROVIDER_SITE_OTHER): Payer: Self-pay | Admitting: General Surgery

## 2013-05-08 VITALS — BP 132/70 | HR 72 | Temp 98.0°F | Resp 18 | Ht 64.0 in | Wt 124.0 lb

## 2013-05-08 DIAGNOSIS — C50219 Malignant neoplasm of upper-inner quadrant of unspecified female breast: Secondary | ICD-10-CM

## 2013-05-08 DIAGNOSIS — C50211 Malignant neoplasm of upper-inner quadrant of right female breast: Secondary | ICD-10-CM

## 2013-05-08 NOTE — Progress Notes (Signed)
Patient ID: Sarah [name], female   DOB: [birth year], 79 y.o.   MRN: [id number]  History:   On 04/03/2012 she underwent right partial mastectomy. Final pathology showed receptor-positive DCIS. She had adjuvant radiation therapy. She was offered antiestrogen therapy but declined. She was discharged from routine followup with Dr. Darnelle Catalan and referred back to her PCP, Dr. Pete Glatter. She expresses interest in continuing followup with me on an annual basis.  At her last visit with me in October she had noticed some focal thickening in the lower inner quadrant of the right breast, remote from her lumpectomy site. We felt this was radiation fibrosis. She underwent bilateral diagnostic mammograms and right breast ultrasound at [facility] of October 14 and everything looked fine. 12 month followup was recommended. She wonders if she needs to be o antiestrogen therapy, which she declined at first. , and we talked about that for a while. I told her if she wanted to return to see Dr. Darnelle Catalan for reconsideration that we would be happy to refer her.  ROS:  She is very healthy. 10 system review of systems is negative except as described above.   Exam:  patient looks well. Thin. Very fit for her age  Neck: No adenopathy or mass. No JVD.  Lungs: Clear to auscultation bilaterally  Heart: Regular rate and rhythm. No murmur. No ectopy  Breast. Right breast reveals a well-healed lumpectomy scar at 12:00 position. Chronic radiation changes with dimpling but but no erythema. Skin is healthy and intact. In the lower inner quadrant, 4:00 position. There is a focal ar  Assessment:  Ductal carcinoma in situ right breast, receptor positive. Doubt recurrence one year following right partial mastectomy and adjuvant radiation therapy  Focal thickening right breast, 4:00 position. Suspect benign radiation change.Exam and imaging studies stable.  Plan:  Repeat bilateral mammograms in October, 2015 and see me at that time She  will let me know if she wishes to be referred for reconsideration of antiestrogen therapy.     Angelia Mould. Derrell Lolling, M.D., [facility]  General and Minimally invasive Surgery  Breast and Colorectal Surgery  Office: [phone]

## 2013-05-08 NOTE — Patient Instructions (Signed)
Your recent imaging studies do not show any abnormality, and that is reassuring.  Your exam today shows a stable area of thickening in the lower inner quadrant of the right breast. I think that this is fibrosis secondary to your radiation therapy. There is no clinical or radiographic evidence for cancer.  Let Dr. Derrell Lolling if you would like to be referred back to Dr. Darnelle Catalan to reconsider antiestrogen therapy. We'll be happy to make that referral.  Be sure to get your mammograms in October, 2015, and see Dr. Derrell Lolling after the x-rays are done.

## 2013-06-02 ENCOUNTER — Telehealth (INDEPENDENT_AMBULATORY_CARE_PROVIDER_SITE_OTHER): Payer: Self-pay

## 2013-06-02 ENCOUNTER — Other Ambulatory Visit: Payer: Self-pay | Admitting: Oncology

## 2013-06-02 DIAGNOSIS — C50211 Malignant neoplasm of upper-inner quadrant of right female breast: Secondary | ICD-10-CM

## 2013-06-02 NOTE — Telephone Encounter (Signed)
Message copied by Gweneth Fritter on Mon Jun 02, 2013 10:13 AM ------      Message from: Gaylyn Lambert      Created: Mon Jun 02, 2013 10:05 AM       Pt called and is requesting to be referred for anti-estrogen therapy.  She mentioned Dr. Jana Hakim.  Please address.            Thank you,      Stevie ------

## 2013-06-04 ENCOUNTER — Encounter: Payer: Self-pay | Admitting: *Deleted

## 2013-06-04 NOTE — Progress Notes (Signed)
Received referral in the workque for this pt to see Dr. Jana Hakim.  She is a current pt of his and so I have emailed Audie Clear to provide me with the best route of getting this pt scheduled with Dr. Virgie Dad scheduler.  Emailed Clarise Cruz at [employer] to make her aware.

## 2013-06-04 NOTE — Telephone Encounter (Signed)
Pt is calling today to f/u on her referral to Dr. Jana Hakim.  I told her the referral has been sent to Dr. Jana Hakim.  I gave her the number to the [employer] so she could call them.

## 2013-06-05 ENCOUNTER — Telehealth: Payer: Self-pay | Admitting: *Deleted

## 2013-06-05 ENCOUNTER — Encounter: Payer: Self-pay | Admitting: *Deleted

## 2013-06-05 NOTE — Telephone Encounter (Signed)
sw pt gv appt for 07/31/13@ 1:45pm. Pt is aware...td

## 2013-06-05 NOTE — Progress Notes (Signed)
Emailed Clarise Cruz at [place] to make her aware of appt w/ Amy.

## 2013-06-10 ENCOUNTER — Other Ambulatory Visit: Payer: Self-pay | Admitting: Oncology

## 2013-07-25 ENCOUNTER — Other Ambulatory Visit: Payer: Self-pay | Admitting: Physician Assistant

## 2013-07-25 ENCOUNTER — Telehealth: Payer: Self-pay | Admitting: Physician Assistant

## 2013-07-25 NOTE — Telephone Encounter (Signed)
, °

## 2013-07-26 ENCOUNTER — Encounter: Payer: Self-pay | Admitting: *Deleted

## 2013-07-28 DIAGNOSIS — R5383 Other fatigue: Secondary | ICD-10-CM | POA: Diagnosis not present

## 2013-07-28 DIAGNOSIS — Z79899 Other long term (current) drug therapy: Secondary | ICD-10-CM | POA: Diagnosis not present

## 2013-07-28 DIAGNOSIS — E7800 Pure hypercholesterolemia, unspecified: Secondary | ICD-10-CM | POA: Diagnosis not present

## 2013-07-28 DIAGNOSIS — Z0000 Encounter for general adult medical examination without abnormal findings: Secondary | ICD-10-CM | POA: Diagnosis not present

## 2013-07-28 DIAGNOSIS — F329 Major depressive disorder, single episode, unspecified: Secondary | ICD-10-CM | POA: Diagnosis not present

## 2013-07-28 DIAGNOSIS — R5381 Other malaise: Secondary | ICD-10-CM | POA: Diagnosis not present

## 2013-07-28 DIAGNOSIS — Z23 Encounter for immunization: Secondary | ICD-10-CM | POA: Diagnosis not present

## 2013-07-28 DIAGNOSIS — F3289 Other specified depressive episodes: Secondary | ICD-10-CM | POA: Diagnosis not present

## 2013-07-28 DIAGNOSIS — E039 Hypothyroidism, unspecified: Secondary | ICD-10-CM | POA: Diagnosis not present

## 2013-07-28 DIAGNOSIS — I10 Essential (primary) hypertension: Secondary | ICD-10-CM | POA: Diagnosis not present

## 2013-07-31 ENCOUNTER — Ambulatory Visit: Payer: Medicare Other | Admitting: Physician Assistant

## 2013-07-31 ENCOUNTER — Telehealth: Payer: Self-pay | Admitting: *Deleted

## 2013-07-31 NOTE — Telephone Encounter (Signed)
Called pt to inform her that Amy is sick and has to leave for the day and cannot see her.  Confirmed pt for 08/26/13 appt.

## 2013-08-20 DIAGNOSIS — H18519 Endothelial corneal dystrophy, unspecified eye: Secondary | ICD-10-CM | POA: Diagnosis not present

## 2013-08-20 DIAGNOSIS — Z961 Presence of intraocular lens: Secondary | ICD-10-CM | POA: Diagnosis not present

## 2013-08-20 DIAGNOSIS — H40019 Open angle with borderline findings, low risk, unspecified eye: Secondary | ICD-10-CM | POA: Diagnosis not present

## 2013-08-20 DIAGNOSIS — H04129 Dry eye syndrome of unspecified lacrimal gland: Secondary | ICD-10-CM | POA: Diagnosis not present

## 2013-08-20 DIAGNOSIS — H53009 Unspecified amblyopia, unspecified eye: Secondary | ICD-10-CM | POA: Diagnosis not present

## 2013-08-20 DIAGNOSIS — H353190 Nonexudative age-related macular degeneration, unspecified eye, stage unspecified: Secondary | ICD-10-CM | POA: Diagnosis not present

## 2013-08-26 ENCOUNTER — Ambulatory Visit (HOSPITAL_BASED_OUTPATIENT_CLINIC_OR_DEPARTMENT_OTHER): Payer: Medicare Other | Admitting: Oncology

## 2013-08-26 VITALS — BP 141/68 | HR 72 | Temp 98.0°F | Wt 126.5 lb

## 2013-08-26 DIAGNOSIS — Z170 Estrogen receptor positive status [ER+]: Secondary | ICD-10-CM | POA: Diagnosis not present

## 2013-08-26 DIAGNOSIS — D0590 Unspecified type of carcinoma in situ of unspecified breast: Secondary | ICD-10-CM | POA: Diagnosis not present

## 2013-08-26 DIAGNOSIS — C50219 Malignant neoplasm of upper-inner quadrant of unspecified female breast: Secondary | ICD-10-CM

## 2013-08-26 MED ORDER — ANASTROZOLE 1 MG PO TABS: 1.0000 mg | ORAL_TABLET | Freq: Every day | ORAL | Status: DC

## 2013-08-26 NOTE — Addendum Note (Signed)
Addended by: Laureen Abrahams on: 08/26/2013 05:34 PM   Modules accepted: Orders

## 2013-08-26 NOTE — Progress Notes (Signed)
ID: Sarah [name]   DOB: Oct 04, 1932  MR#: [id number]  UMP#:[id number]  PCP: Mathews Argyle, MD GYN:  SU: Fanny Skates OTHER MD: Arloa Koh, Kathryne Hitch  Chief complaint" "I have a lump in the right breast"  HISTORY OF PRESENT ILLNESS: The patient had bilateral diagnostic mammography at [facility] 07/11/2011. This showed some calcifications in the right breast, which seemed a little bit more prominent than prior. In the left breast there was an area of possible architectural distortion. However left breast ultrasound the same day showed no abnormality. 6 month followup was suggested, and on 02/28/2012 the patient again had bilateral diagnostic mammography, now with right ultrasonography. The microcalcifications in the right breast appeared increased. Ultrasound showed a hypoechoic lesion measuring 5 mm, with no associated shadowing. This had been previously noted and appeared unchanged. Anterior to that there was a small hypoechoic mass measuring 4 mm in diameter. There was felt to be suspicious, and on 03/14/2012 the patient underwent biopsy of the right breast mass, showing ([id number]) ductal carcinoma in situ, intermediate grade, estrogen receptor 100% and progesterone receptor 100% positive.  Bilateral breast MRI was obtained 03/26/2012. This showed only post biopsy changes in the right breast, associated with an area of non-masslike enhancement measuring 2.4 cm. The left breast was unremarkable, and there was no enlarged axillary or internal mammary adenopathy noted. Accordingly on 04/09/2012 the patient underwent right lumpectomy, the pathology ([id number]) showing ductal carcinoma in situ measuring 2.0 cm, grade 2, with negative margins, the closest being 0.3 cm. The patient's subsequent history is as detailed below.  INTERVAL HISTORY: Sarah [name] returns today for followup of her breast cancer. We have released her from followup, but she became concerned about an area of change in the  inferior aspect of the right breast. This was evaluated at [facility] as described below. It is felt to be scar tissue from her surgery and radiation. Her concern is whether or it might shrink or become softer if we started anti-estrogens.  REVIEW OF SYSTEMS: She is taking 30 minutes walks about 3 times a week. She is interested in younger but has not started that. She denies unusual headaches, visual changes, cough, phlegm production, pleurisy, or change in bowel or bladder habits. "Very healthy". Detailed review of systems today was otherwise noncontributory  PAST MEDICAL HISTORY: Past Medical History  Diagnosis Date  . PONV (postoperative nausea and vomiting)   . Hypertension   . Hyperlipemia   . Arthritis   . Cancer     breast  . Wears glasses   . Wears partial dentures     partial upper  . HOH (hard of hearing)   . Breast cancer 03/14/12    bx=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,ER/PR=+,upper inner quad  . Cataract   . Hypothyroidism   . Pancreatic cyst     benign  . Allergy     codeine, thiazides  . Anxiety     new dx  . Glaucoma     laser treated years ago  . Vertigo   . Radiation 06/11/2012-07/12/2012    17 sessions 4250 cGy, 3 sessions 750 cGy    PAST SURGICAL HISTORY: Past Surgical History  Procedure Laterality Date  . Tonsillectomy    . Total knee arthroplasty  08/16/2011    Procedure: TOTAL KNEE ARTHROPLASTY;  Surgeon: Ninetta Lights, MD;  Location: [place];  Service: Orthopedics;  Laterality: Right;  . Joint replacement  2013    rt total knee  . Joint replacement  1995    lt total knee  . Breast surgery  1992    lumpectomy-lt  . Colonoscopy    . Eye surgery      bilateral cataract removal  . Partial mastectomy with needle localization  04/09/2012    Procedure: PARTIAL MASTECTOMY WITH NEEDLE LOCALIZATION;  Surgeon: Adin Hector, MD;  Location: [place];  Service: General;  Laterality: Right;  . Cataract extraction       b/l  . Abdominal hysterectomy  1966    1/2 ovary left in     FAMILY HISTORY Family History  Problem Relation Age of Onset  . Heart disease Father   . Cancer Maternal Aunt     stomach   the patient never met her father, does not know his cause of death or age at death, but thinks it may have been of likely a heart attack based on comments in the obituary. The patient's mother died at the age of 79 from a rare blood disorder. The patient does not recall the name of the problem. The patient has no full siblings. She may have a half-brother. There is no history of breast or ovarian cancer in the family.  GYNECOLOGIC HISTORY: Menarche age 36, first live birth age 83, the patient is Sarah [name] P2. She had her hysterectomy at age 75 and took hormone replacement until approximately 1973.  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 She is a widow, her husband died of from acute myeloid leukemia in 2012  (he only lived 6 weeks from the time of diagnosis). Sarah [name] Sarah [name] lives in [place] and is a Civil engineer, contracting. Daughter Sarah [name] lives in [place] and is a Engineer, manufacturing systems. The patient has 1 grandchild. She attends a [employer].    ADVANCED DIRECTIVES: In place; the patient tells me her daughter Sarah [name] is her healthcare power of attorney. She can be reached at [phone]. The patient also wanted [place] to have her Sarah [name]'s phone number. It is [phone].  HEALTH MAINTENANCE: History  Substance Use Topics  . Smoking status: Never Smoker   . Smokeless tobacco: Not on file  . Alcohol Use: No     Colonoscopy: 2013/[facility]   PAP: s/p hysterectomy  Bone density: 2013/ "normal"  Lipid panel:  Allergies  Allergen Reactions  . Codeine Other (See Comments)    Unknown reaction  . Thiazide-Type Diuretics Other (See Comments)    Blurry vision    Current Outpatient Prescriptions  Medication Sig Dispense Refill  . amLODipine (NORVASC) 5 MG tablet Take 5 mg by mouth 2 (two) times daily.      Marland Kitchen  anastrozole (ARIMIDEX) 1 MG tablet Take 1 tablet (1 mg total) by mouth daily.  90 tablet  3  . aspirin 81 MG tablet Take 81 mg by mouth daily.      . Cholecalciferol (D3-1000 PO) Take by mouth daily.      Marland Kitchen levothyroxine (SYNTHROID, LEVOTHROID) 50 MCG tablet Take 50 mcg by mouth daily.      . Lutein 20 MG TABS Take by mouth daily.      . Misc Natural Products (GRAPE SEED COMPLEX PO) Take by mouth 2 (two) times daily.      . Multiple Vitamins-Minerals (PRESERVISION AREDS PO) Take by mouth daily.      . vitamin B-12 (CYANOCOBALAMIN) 100 MCG tablet Take 50 mcg by mouth daily.      Marland Kitchen zolpidem (AMBIEN) 5 MG tablet Take 5 mg by mouth at bedtime as needed.  No current facility-administered medications for this visit.    OBJECTIVE: Middle-aged white woman who appears younger than stated age  80 Vitals:   08/26/13 1642  BP: 141/68  Pulse: 72  Temp: 98 F (36.7 C)     Body mass index is 21.7 kg/(m^2).    ECOG FS: 0  Sclerae unicteric, pupils equal and reactive  Oropharynx clearAnd moist  No cervical or supraclavicular adenopathy Lungs no rales or rhonchi Heart regular rate and rhythm Abd  soft, nontender, positive bowel sounds  MSK no focal spinal tenderness, no  joint , pleasant affect  edema Neuro: nonfocal Breasts: The right breast is status post lumpectomy and radiation. There is some induration beneath the scar,  specifically in the inferior aspect of the breast. There is no erythema or tenderness. This appears entirely consistent with scar tissue.  The right axilla is benign. The left breast is unremarkable.   LAB RESULTS: Lab Results  Component Value Date   WBC 5.0 04/25/2012   NEUTROABS 3.3 04/25/2012   HGB 14.6 04/25/2012   HCT 43.1 04/25/2012   MCV 90.0 04/25/2012   PLT 187 04/25/2012      Chemistry      Component Value Date/Time   NA 142 04/25/2012 1606   NA 142 04/05/2012 1350   K 3.6 04/25/2012 1606   K 4.1 04/05/2012 1350   CL 106 04/25/2012 1606   CL 104  04/05/2012 1350   CO2 28 04/25/2012 1606   CO2 28 04/05/2012 1350   BUN 21.0 04/25/2012 1606   BUN 19 04/05/2012 1350   CREATININE 0.8 04/25/2012 1606   CREATININE 0.51 04/05/2012 1350      Component Value Date/Time   CALCIUM 10.1 04/25/2012 1606   CALCIUM 10.4 04/05/2012 1350   ALKPHOS 85 04/25/2012 1606   ALKPHOS 77 04/05/2012 1350   AST 17 04/25/2012 1606   AST 20 04/05/2012 1350   ALT 13 04/25/2012 1606   ALT 14 04/05/2012 1350   BILITOT 0.67 04/25/2012 1606   BILITOT 0.4 04/05/2012 1350       No results found for this basename: LABCA2    No components found with this basename: LABCA125    No results found for this basename: INR,  in the last 168 hours  Urinalysis    Component Value Date/Time   COLORURINE YELLOW 04/09/2012 1202   APPEARANCEUR CLEAR 04/09/2012 1202   LABSPEC 1.011 04/09/2012 1202   PHURINE 7.0 04/09/2012 1202   GLUCOSEU NEGATIVE 04/09/2012 1202   HGBUR NEGATIVE 04/09/2012 1202   BILIRUBINUR NEGATIVE 04/09/2012 1202   KETONESUR NEGATIVE 04/09/2012 1202   PROTEINUR NEGATIVE 04/09/2012 1202   UROBILINOGEN 0.2 04/09/2012 1202   NITRITE NEGATIVE 04/09/2012 1202   LEUKOCYTESUR TRACE* 04/09/2012 1202    STUDIES: Mammography at [facility] 03/04/2013 was unremarkable. Ultrasound same date states "there is a benign appearing lumpectomy scar in the right breast correlating with the area of clinical concern".  ASSESSMENT: 80 y.o. [place] woman status post right lumpectomy 04/09/2012 for a ductal carcinoma in situ, grade 2, estrogen and progesterone receptor positive, both at 100%, with negative margins.  (1) completed adjuvant radiation 02//20/2014  (2) completed adjuvant radiation 07/12/2012  (3) decided against adjuvant antiestrogen originally, agreed to anastrozole   PLAN:  We spent approximately 45 minutes going over [place] situation. Zoiee is worried about the area of thickening in the inferior aspect of her right breast. This really does feel just like  scar tissue and she was evaluated for this at [facility] October  of 2014 with mammography and ultrasonography and day concluded this was consistent with radiation change. I offered Dayshia further evaluation with breast MRI understanding this might be expensive. She decided against that.  We then discussed on the estrogens. Since this is scar tissue, it should make no difference whether or not she takes anti-estrogens. It will still be we are years from now.  We reviewed the fact that her tumor was noninvasive. Accordingly we are now talking about life or death decisions. In general my recommendation in these cases is that she try an antiestrogen and if she tolerates it well then we can continue.  We then discussed tamoxifen versus anastrozole and even though she is status post hysterectomy I would feel more comfortable with her taking anastrozole. According to her she had a normal bone density last year.  I went ahead and wrote a prescription. She will see me again in 3 months. If she tolerates the anastrozole well then we will take measures to prevent osteoporosis from developing. If she does not probably we will go back to observation alone.  Tineshia was very anxious but does understand this plan and agrees with it she has a good understanding of the possible toxicities, side effects and complications of anastrozole. She will call with any problems that may develop before her next visit here.   Tou Hayner C    08/26/2013

## 2013-08-27 ENCOUNTER — Telehealth: Payer: Self-pay | Admitting: Oncology

## 2013-08-27 NOTE — Telephone Encounter (Signed)
s.w. pt and advised on July appt....pt ok and aware

## 2013-09-12 DIAGNOSIS — I10 Essential (primary) hypertension: Secondary | ICD-10-CM | POA: Diagnosis not present

## 2013-09-12 DIAGNOSIS — Z733 Stress, not elsewhere classified: Secondary | ICD-10-CM | POA: Diagnosis not present

## 2013-09-12 DIAGNOSIS — M25559 Pain in unspecified hip: Secondary | ICD-10-CM | POA: Diagnosis not present

## 2013-09-16 DIAGNOSIS — M25569 Pain in unspecified knee: Secondary | ICD-10-CM | POA: Diagnosis not present

## 2013-09-16 DIAGNOSIS — M76899 Other specified enthesopathies of unspecified lower limb, excluding foot: Secondary | ICD-10-CM | POA: Diagnosis not present

## 2013-09-29 ENCOUNTER — Ambulatory Visit (INDEPENDENT_AMBULATORY_CARE_PROVIDER_SITE_OTHER): Payer: Medicare Other | Admitting: Ophthalmology

## 2013-09-29 DIAGNOSIS — I10 Essential (primary) hypertension: Secondary | ICD-10-CM | POA: Diagnosis not present

## 2013-09-29 DIAGNOSIS — H35039 Hypertensive retinopathy, unspecified eye: Secondary | ICD-10-CM

## 2013-09-29 DIAGNOSIS — H3530 Unspecified macular degeneration: Secondary | ICD-10-CM

## 2013-09-29 DIAGNOSIS — H43819 Vitreous degeneration, unspecified eye: Secondary | ICD-10-CM

## 2013-12-18 ENCOUNTER — Other Ambulatory Visit: Payer: Self-pay

## 2013-12-18 ENCOUNTER — Ambulatory Visit (HOSPITAL_BASED_OUTPATIENT_CLINIC_OR_DEPARTMENT_OTHER): Payer: Medicare Other | Admitting: Oncology

## 2013-12-18 VITALS — BP 149/84 | HR 80 | Temp 97.9°F | Resp 18 | Ht 64.0 in | Wt 126.7 lb

## 2013-12-18 DIAGNOSIS — M8580 Other specified disorders of bone density and structure, unspecified site: Secondary | ICD-10-CM | POA: Insufficient documentation

## 2013-12-18 DIAGNOSIS — I10 Essential (primary) hypertension: Secondary | ICD-10-CM

## 2013-12-18 DIAGNOSIS — Z170 Estrogen receptor positive status [ER+]: Secondary | ICD-10-CM

## 2013-12-18 DIAGNOSIS — M899 Disorder of bone, unspecified: Secondary | ICD-10-CM

## 2013-12-18 DIAGNOSIS — M949 Disorder of cartilage, unspecified: Secondary | ICD-10-CM | POA: Diagnosis not present

## 2013-12-18 DIAGNOSIS — C50919 Malignant neoplasm of unspecified site of unspecified female breast: Secondary | ICD-10-CM

## 2013-12-18 DIAGNOSIS — D0590 Unspecified type of carcinoma in situ of unspecified breast: Secondary | ICD-10-CM

## 2013-12-18 DIAGNOSIS — I440 Atrioventricular block, first degree: Secondary | ICD-10-CM | POA: Insufficient documentation

## 2013-12-18 DIAGNOSIS — C50219 Malignant neoplasm of upper-inner quadrant of unspecified female breast: Secondary | ICD-10-CM

## 2013-12-18 NOTE — Assessment & Plan Note (Signed)
Osteopenia  Repeat  Bone density pending  Consider zolendronate yearly

## 2013-12-18 NOTE — Progress Notes (Signed)
ID: Sarah [name]   DOB: [birth year]  MR#: [id number]  DUK#:[id number]  PCP: Mathews Argyle, MD GYN:  SUFanny Skates OTHER MD: Arloa Koh, Kathryne Hitch  CHIEF COMPLAINT: Noninvasive breast cancer  CURRENT TREATMENT: Anastrozole  BREAST CANCER HISTORY: From the original intake note:  The patient had bilateral diagnostic mammography at [facility] 07/11/2011. This showed some calcifications in the right breast, which seemed a little bit more prominent than prior. In the left breast there was an area of possible architectural distortion. However left breast ultrasound the same day showed no abnormality. 6 month followup was suggested, and on 02/28/2012 the patient again had bilateral diagnostic mammography, now with right ultrasonography. The microcalcifications in the right breast appeared increased. Ultrasound showed a hypoechoic lesion measuring 5 mm, with no associated shadowing. This had been previously noted and appeared unchanged. Anterior to that there was a small hypoechoic mass measuring 4 mm in diameter. There was felt to be suspicious, and on 03/14/2012 the patient underwent biopsy of the right breast mass, showing ([id number]) ductal carcinoma in situ, intermediate grade, estrogen receptor 100% and progesterone receptor 100% positive.  Bilateral breast MRI was obtained 03/26/2012. This showed only post biopsy changes in the right breast, associated with an area of non-masslike enhancement measuring 2.4 cm. The left breast was unremarkable, and there was no enlarged axillary or internal mammary adenopathy noted. Accordingly on 04/09/2012 the patient underwent right lumpectomy, the pathology ([id number]) showing ductal carcinoma in situ measuring 2.0 cm, grade 2, with negative margins, the closest being 0.3 cm. The patient's subsequent history is as detailed below.  INTERVAL HISTORY: Camira returns today for followup of her breast cancer. The interval history is significant for  her having started anastrozole in April. She reports absolutely no side effects from this, and specifically no problems with hot flashes, vaginal dryness, or arthralgias/myalgias. The other news is that her cat of 19 years died recently. She is considering getting a police dog as a companion.  REVIEW OF SYSTEMS: Density is not exercising regularly but she goes up and down stairs in her house about 10 times a day, she says. She goes to the [employer] on Thursday afternoon to listen to the jazz in the lobby. She is of course socially very active and is going to some [place] conserves with a neighbor who is a Transport planner. A detailed review of systems today was otherwise entirely negative.  PAST MEDICAL HISTORY: Past Medical History  Diagnosis Date  . PONV (postoperative nausea and vomiting)   . Hypertension   . Hyperlipemia   . Arthritis   . Cancer     breast  . Wears glasses   . Wears partial dentures     partial upper  . HOH (hard of hearing)   . Breast cancer 03/14/12    bx=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,ER/PR=+,upper inner quad  . Cataract   . Hypothyroidism   . Pancreatic cyst     benign  . Allergy     codeine, thiazides  . Anxiety     new dx  . Glaucoma     laser treated years ago  . Vertigo   . Radiation 06/11/2012-07/12/2012    17 sessions 4250 cGy, 3 sessions 750 cGy    PAST SURGICAL HISTORY: Past Surgical History  Procedure Laterality Date  . Tonsillectomy    . Total knee arthroplasty  08/16/2011    Procedure: TOTAL KNEE ARTHROPLASTY;  Surgeon: Ninetta Lights, MD;  Location: [place];  Service: Orthopedics;  Laterality: Right;  . Joint replacement  2013    rt total knee  . Joint replacement  1995    lt total knee  . Breast surgery  1992    lumpectomy-lt  . Colonoscopy    . Eye surgery      bilateral cataract removal  . Partial mastectomy with needle localization  04/09/2012    Procedure: PARTIAL MASTECTOMY WITH NEEDLE  LOCALIZATION;  Surgeon: Adin Hector, MD;  Location: [place];  Service: General;  Laterality: Right;  . Cataract extraction      b/l  . Abdominal hysterectomy  1966    1/2 ovary left in     FAMILY HISTORY Family History  Problem Relation Age of Onset  . Heart disease Father   . Cancer Maternal Aunt     stomach   the patient never met her father, does not know his cause of death or age at death, but thinks it may have been of likely a heart attack based on comments in the obituary. The patient's mother died at the age of 51 from a rare blood disorder. The patient does not recall the name of the problem. The patient has no full siblings. She may have a half-brother. There is no history of breast or ovarian cancer in the family.  GYNECOLOGIC HISTORY: Menarche age 80, first live birth age 19, the patient is [place] P2. She had her hysterectomy at age 80 and took hormone replacement until approximately 1973.  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 Son Nicki Reaper lives in [place] and is a Civil engineer, contracting. Daughter [name] lives in [place] and is a Engineer, manufacturing systems. The patient has 1 grandchild. She attends a [employer].    ADVANCED DIRECTIVES: In place; the patient tells me [name] is her healthcare power of attorney. She can be reached at [phone]. The patient also wanted [place] to have her son's phone number. It is [phone].  HEALTH MAINTENANCE: History  Substance Use Topics  . Smoking status: Never Smoker   . Smokeless tobacco: Not on file  . Alcohol Use: No     Colonoscopy: 2013/[facility]   PAP: s/p hysterectomy  Bone density: 2013/ "normal"  Lipid panel:  Allergies  Allergen Reactions  . Codeine Other (See Comments)    Unknown reaction  . Thiazide-Type Diuretics Other (See Comments)    Blurry vision     Current Outpatient Prescriptions  Medication Sig Dispense Refill  . amLODipine (NORVASC) 5 MG tablet Take 3.5 mg by mouth 2 (two) times daily.       Marland Kitchen anastrozole (ARIMIDEX) 1 MG tablet Take 1 tablet (1 mg total) by mouth daily.  90 tablet  3  . Cholecalciferol (D3-1000 PO) Take by mouth daily.      Marland Kitchen levothyroxine (SYNTHROID, LEVOTHROID) 50 MCG tablet Take 50 mcg by mouth daily.      . Lutein 20 MG TABS Take by mouth daily.      . Misc Natural Products (GRAPE SEED COMPLEX PO) Take by mouth 2 (two) times daily.      . Multiple Vitamins-Minerals (PRESERVISION AREDS PO) Take by mouth daily.      . vitamin B-12 (CYANOCOBALAMIN) 100 MCG tablet Take 50 mcg by mouth daily.      Marland Kitchen zolpidem (AMBIEN) 5 MG tablet Take 5 mg by  mouth at bedtime as needed.       No current facility-administered medications for this visit.    OBJECTIVE: Middle-aged white woman in no acute distress Filed Vitals:   12/18/13 1626  BP: 149/84  Pulse: 80  Temp: 97.9 F (36.6 C)  Resp: 18     Body mass index is 21.74 kg/(m^2).    ECOG FS: 0  Sclerae unicteric, EOMs intact Oropharynx clear, teeth in good repair  No cervical or supraclavicular adenopathy Lungs no rales or rhonchi Heart regular rate with occasional PVCs Abd  soft, nontender, positive bowel sounds  MSK no focal spinal tenderness, no  joint , pleasant affect  edema Neuro: nonfocal, well oriented, positive affect Breasts: The right breast is status post lumpectomy and radiation. There is scarring in the lower inner quadrant, unchanged from baseline. There is no evidence of local recurrence. The right axilla is benign. The left breast is unremarkable.   LAB RESULTS: Lab Results  Component Value Date   WBC 5.0 04/25/2012   NEUTROABS 3.3 04/25/2012   HGB 14.6 04/25/2012   HCT 43.1 04/25/2012   MCV 90.0 04/25/2012   PLT 187 04/25/2012      Chemistry      Component Value Date/Time   NA 142 04/25/2012 1606   NA 142 04/05/2012 1350   K 3.6  04/25/2012 1606   K 4.1 04/05/2012 1350   CL 106 04/25/2012 1606   CL 104 04/05/2012 1350   CO2 28 04/25/2012 1606   CO2 28 04/05/2012 1350   BUN 21.0 04/25/2012 1606   BUN 19 04/05/2012 1350   CREATININE 0.8 04/25/2012 1606   CREATININE 0.51 04/05/2012 1350      Component Value Date/Time   CALCIUM 10.1 04/25/2012 1606   CALCIUM 10.4 04/05/2012 1350   ALKPHOS 85 04/25/2012 1606   ALKPHOS 77 04/05/2012 1350   AST 17 04/25/2012 1606   AST 20 04/05/2012 1350   ALT 13 04/25/2012 1606   ALT 14 04/05/2012 1350   BILITOT 0.67 04/25/2012 1606   BILITOT 0.4 04/05/2012 1350       No results found for this basename: LABCA2    No components found with this basename: LABCA125    No results found for this basename: INR,  in the last 168 hours  Urinalysis    Component Value Date/Time   COLORURINE YELLOW 04/09/2012 [place] 04/09/2012 1202   LABSPEC 1.011 04/09/2012 1202   PHURINE 7.0 04/09/2012 1202   GLUCOSEU NEGATIVE 04/09/2012 [place] 04/09/2012 [place] 04/09/2012 [place] 04/09/2012 1202   PROTEINUR NEGATIVE 04/09/2012 1202   UROBILINOGEN 0.2 04/09/2012 1202   NITRITE NEGATIVE 04/09/2012 1202   LEUKOCYTESUR TRACE* 04/09/2012 1202    STUDIES: Right breast ultrasound March 13, 2013 was unremarkable  ASSESSMENT: 80 y.o. [place] woman status post right lumpectomy 04/09/2012 for a ductal carcinoma in situ, grade 2, estrogen and progesterone receptor positive, both at 100%, with negative margins.  (1) completed adjuvant radiation 02//20/2014  (2) completed adjuvant radiation 07/12/2012  (3) decided against adjuvant antiestrogen originally, agreed to anastrozole   (4) osteopenia  (5) first degree heart block with PVCs  PLAN:  Nimsi is tolerating the anastrozole with essentially no side effects. Cost is also not an issue--she is getting for free. The concern of course is worsening osteopenia. She is due for  repeat bone density sometime this year to Dr. Carlyle Lipa office. I have sent him a letter asking him  to copy me on those results so we can consider whether zolendronate once a year might be a good option for her and lung of course many other options to deal with bone density.  On exam today I noted some premature beats. The electrocardiogram showed first degree heart block with occasional PVCs. I faxed a copy to Dr. Felipa Eth for him to followup at his discretion.  Otherwise Meliss will return to see me in January. She has a good understanding of the overall plan. She agrees with that. She knows the goal of treatment in her cases cure. She will call with any problems that may develop before her next visit here.  Eleri Ruben C    12/18/2013

## 2013-12-18 NOTE — Assessment & Plan Note (Signed)
80 y.o.  [place] woman status post right lumpectomy 04/09/2012 for a ductal carcinoma in situ, grade 2, estrogen and progesterone receptor positive, both at 100%, with negative margins.  (1) completed adjuvant radiation 02//20/2014  (2) completed adjuvant radiation 07/12/2012  (3) decided against adjuvant antiestrogen originally, agreed to anastrozole April 2015

## 2013-12-19 ENCOUNTER — Telehealth: Payer: Self-pay | Admitting: Oncology

## 2013-12-19 NOTE — Telephone Encounter (Signed)
lvm for pt regarding to Jan 2016 appt....mailed pt appt sched and letter

## 2013-12-19 NOTE — Addendum Note (Signed)
Addended by: Amelia Jo I on: 12/19/2013 12:49 PM   Modules accepted: Orders

## 2014-02-16 DIAGNOSIS — Z23 Encounter for immunization: Secondary | ICD-10-CM | POA: Diagnosis not present

## 2014-02-16 DIAGNOSIS — L659 Nonscarring hair loss, unspecified: Secondary | ICD-10-CM | POA: Diagnosis not present

## 2014-02-16 DIAGNOSIS — I10 Essential (primary) hypertension: Secondary | ICD-10-CM | POA: Diagnosis not present

## 2014-02-16 DIAGNOSIS — E039 Hypothyroidism, unspecified: Secondary | ICD-10-CM | POA: Diagnosis not present

## 2014-03-05 DIAGNOSIS — R928 Other abnormal and inconclusive findings on diagnostic imaging of breast: Secondary | ICD-10-CM | POA: Diagnosis not present

## 2014-03-05 DIAGNOSIS — Z853 Personal history of malignant neoplasm of breast: Secondary | ICD-10-CM | POA: Diagnosis not present

## 2014-03-20 DIAGNOSIS — I10 Essential (primary) hypertension: Secondary | ICD-10-CM | POA: Diagnosis not present

## 2014-03-20 DIAGNOSIS — M8580 Other specified disorders of bone density and structure, unspecified site: Secondary | ICD-10-CM | POA: Diagnosis not present

## 2014-03-24 DIAGNOSIS — L650 Telogen effluvium: Secondary | ICD-10-CM | POA: Diagnosis not present

## 2014-04-21 DIAGNOSIS — D225 Melanocytic nevi of trunk: Secondary | ICD-10-CM | POA: Diagnosis not present

## 2014-04-21 DIAGNOSIS — D2262 Melanocytic nevi of left upper limb, including shoulder: Secondary | ICD-10-CM | POA: Diagnosis not present

## 2014-04-21 DIAGNOSIS — D2272 Melanocytic nevi of left lower limb, including hip: Secondary | ICD-10-CM | POA: Diagnosis not present

## 2014-04-21 DIAGNOSIS — L821 Other seborrheic keratosis: Secondary | ICD-10-CM | POA: Diagnosis not present

## 2014-04-21 DIAGNOSIS — I8311 Varicose veins of right lower extremity with inflammation: Secondary | ICD-10-CM | POA: Diagnosis not present

## 2014-04-21 DIAGNOSIS — L650 Telogen effluvium: Secondary | ICD-10-CM | POA: Diagnosis not present

## 2014-04-21 DIAGNOSIS — D1801 Hemangioma of skin and subcutaneous tissue: Secondary | ICD-10-CM | POA: Diagnosis not present

## 2014-04-21 DIAGNOSIS — I8312 Varicose veins of left lower extremity with inflammation: Secondary | ICD-10-CM | POA: Diagnosis not present

## 2014-05-05 DIAGNOSIS — M8580 Other specified disorders of bone density and structure, unspecified site: Secondary | ICD-10-CM | POA: Diagnosis not present

## 2014-05-05 DIAGNOSIS — Z780 Asymptomatic menopausal state: Secondary | ICD-10-CM | POA: Diagnosis not present

## 2014-05-20 ENCOUNTER — Telehealth: Payer: Self-pay | Admitting: Oncology

## 2014-05-20 NOTE — Telephone Encounter (Signed)
Lvm advising appt chg from 1/28 (md pal) to 3/29 Lab and 4/5 md visit. Mailed appt calendars.

## 2014-06-11 ENCOUNTER — Other Ambulatory Visit: Payer: Medicare Other

## 2014-06-15 DIAGNOSIS — I10 Essential (primary) hypertension: Secondary | ICD-10-CM | POA: Diagnosis not present

## 2014-06-15 DIAGNOSIS — D0511 Intraductal carcinoma in situ of right breast: Secondary | ICD-10-CM | POA: Diagnosis not present

## 2014-06-15 DIAGNOSIS — M8580 Other specified disorders of bone density and structure, unspecified site: Secondary | ICD-10-CM | POA: Diagnosis not present

## 2014-06-18 ENCOUNTER — Ambulatory Visit: Payer: Medicare Other | Admitting: Oncology

## 2014-08-11 DIAGNOSIS — L649 Androgenic alopecia, unspecified: Secondary | ICD-10-CM | POA: Diagnosis not present

## 2014-08-11 DIAGNOSIS — L650 Telogen effluvium: Secondary | ICD-10-CM | POA: Diagnosis not present

## 2014-08-13 ENCOUNTER — Telehealth: Payer: Self-pay | Admitting: Oncology

## 2014-08-13 NOTE — Telephone Encounter (Signed)
pt cld left voicemail in re to r/s lab to [place] time-cld pt back 7 left message of update time & date appt r/s to

## 2014-08-18 ENCOUNTER — Other Ambulatory Visit: Payer: Medicare Other

## 2014-08-18 ENCOUNTER — Other Ambulatory Visit (HOSPITAL_BASED_OUTPATIENT_CLINIC_OR_DEPARTMENT_OTHER): Payer: Medicare Other

## 2014-08-18 DIAGNOSIS — I10 Essential (primary) hypertension: Secondary | ICD-10-CM

## 2014-08-18 DIAGNOSIS — D0511 Intraductal carcinoma in situ of right breast: Secondary | ICD-10-CM | POA: Diagnosis not present

## 2014-08-18 DIAGNOSIS — C50219 Malignant neoplasm of upper-inner quadrant of unspecified female breast: Secondary | ICD-10-CM

## 2014-08-18 LAB — COMPREHENSIVE METABOLIC PANEL (CC13)
ALBUMIN: 4.6 g/dL (ref 3.5–5.0)
ALK PHOS: 97 U/L (ref 40–150)
ALT: 18 U/L (ref 0–55)
AST: 22 U/L (ref 5–34)
Anion Gap: 12 mEq/L — ABNORMAL HIGH (ref 3–11)
BUN: 14.9 mg/dL (ref 7.0–26.0)
CALCIUM: 10.2 mg/dL (ref 8.4–10.4)
CO2: 23 meq/L (ref 22–29)
CREATININE: 0.7 mg/dL (ref 0.6–1.1)
Chloride: 106 mEq/L (ref 98–109)
EGFR: 81 mL/min/{1.73_m2} — AB (ref >90)
GLUCOSE: 83 mg/dL (ref 70–140)
POTASSIUM: 4 meq/L (ref 3.5–5.1)
Sodium: 141 mEq/L (ref 136–145)
Total Bilirubin: 0.74 mg/dL (ref 0.20–1.20)
Total Protein: 8 g/dL (ref 6.4–8.3)

## 2014-08-25 ENCOUNTER — Ambulatory Visit (HOSPITAL_BASED_OUTPATIENT_CLINIC_OR_DEPARTMENT_OTHER): Payer: Medicare Other | Admitting: Oncology

## 2014-08-25 ENCOUNTER — Telehealth: Payer: Self-pay | Admitting: Oncology

## 2014-08-25 ENCOUNTER — Other Ambulatory Visit: Payer: Medicare Other

## 2014-08-25 VITALS — BP 137/84 | HR 76 | Temp 97.6°F | Resp 18 | Ht 64.0 in | Wt 124.8 lb

## 2014-08-25 DIAGNOSIS — Z170 Estrogen receptor positive status [ER+]: Secondary | ICD-10-CM

## 2014-08-25 DIAGNOSIS — M8580 Other specified disorders of bone density and structure, unspecified site: Secondary | ICD-10-CM | POA: Diagnosis not present

## 2014-08-25 DIAGNOSIS — D0511 Intraductal carcinoma in situ of right breast: Secondary | ICD-10-CM

## 2014-08-25 DIAGNOSIS — I440 Atrioventricular block, first degree: Secondary | ICD-10-CM

## 2014-08-25 MED ORDER — ANASTROZOLE 1 MG PO TABS: 1.0000 mg | ORAL_TABLET | Freq: Every day | ORAL | Status: DC

## 2014-08-25 NOTE — Telephone Encounter (Signed)
per pof to sch pt appt-per pt req to mail copy-mailed

## 2014-08-25 NOTE — Progress Notes (Signed)
ID: [name]   DOB: [birth year]  MR#: [id number]  EYC#:[id number]  PCP: Mathews Argyle, MD GYN:  SUFanny Skates OTHER MD: Arloa Koh, Kathryne Hitch  CHIEF COMPLAINT: Noninvasive breast cancer  CURRENT TREATMENT: Anastrozole  BREAST CANCER HISTORY: From the original intake note:  The patient had bilateral diagnostic mammography at [facility] 07/11/2011. This showed some calcifications in the right breast, which seemed a little bit more prominent than prior. In the left breast there was an area of possible architectural distortion. However left breast ultrasound the same day showed no abnormality. 6 month followup was suggested, and on 02/28/2012 the patient again had bilateral diagnostic mammography, now with right ultrasonography. The microcalcifications in the right breast appeared increased. Ultrasound showed a hypoechoic lesion measuring 5 mm, with no associated shadowing. This had been previously noted and appeared unchanged. Anterior to that there was a small hypoechoic mass measuring 4 mm in diameter. There was felt to be suspicious, and on 03/14/2012 the patient underwent biopsy of the right breast mass, showing ([id number]) ductal carcinoma in situ, intermediate grade, estrogen receptor 100% and progesterone receptor 100% positive.  Bilateral breast MRI was obtained 03/26/2012. This showed only post biopsy changes in the right breast, associated with an area of non-masslike enhancement measuring 2.4 cm. The left breast was unremarkable, and there was no enlarged axillary or internal mammary adenopathy noted. Accordingly on 04/09/2012 the patient underwent right lumpectomy, the pathology ([id number]) showing ductal carcinoma in situ measuring 2.0 cm, grade 2, with negative margins, the closest being 0.3 cm. The patient's subsequent history is as detailed below.  INTERVAL HISTORY: Sarah [name] for followup of her noninvasive breast cancer. She continues on anastrozole,  which she obtains for less than $15 a month. She is not aware of any side effects from it at all.  REVIEW OF SYSTEMS: She is having significant visual problems with macular degeneration and glaucoma. She is having some hearing loss issues. She feels "forgetful". She feels anxious but not depressed. She tells me her children are going to kidnnap her at the time of her 82th birthday, coming up in a couple of months and take are probably to the mountains for a few days. She is looking forward to that. She exercises daily through a TV exercise program. A detailed review of systems was otherwise noncontributory  PAST MEDICAL HISTORY: Past Medical History  Diagnosis Date  . PONV (postoperative nausea and vomiting)   . Hypertension   . Hyperlipemia   . Arthritis   . Cancer     breast  . Wears glasses   . Wears partial dentures     partial upper  . HOH (hard of hearing)   . Breast cancer 03/14/12    bx=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,ER/PR=+,upper inner quad  . Cataract   . Hypothyroidism   . Pancreatic cyst     benign  . Allergy     codeine, thiazides  . Anxiety     new dx  . Glaucoma     laser treated years ago  . Vertigo   . Radiation 06/11/2012-07/12/2012    17 sessions 4250 cGy, 3 sessions 750 cGy    PAST SURGICAL HISTORY: Past Surgical History  Procedure Laterality Date  . Tonsillectomy    . Total knee arthroplasty  08/16/2011    Procedure: TOTAL KNEE ARTHROPLASTY;  Surgeon: Ninetta Lights, MD;  Location: [facility];  Service: Orthopedics;  Laterality: Right;  . Joint replacement  2013  rt total knee  . Joint replacement  1995    lt total knee  . Breast surgery  1992    lumpectomy-lt  . Colonoscopy    . Eye surgery      bilateral cataract removal  . Partial mastectomy with needle localization  04/09/2012    Procedure: PARTIAL MASTECTOMY WITH NEEDLE LOCALIZATION;  Surgeon: Adin Hector, MD;  Location: [place];  Service:  General;  Laterality: Right;  . Cataract extraction      b/l  . Abdominal hysterectomy  1966    1/2 ovary left in     FAMILY HISTORY Family History  Problem Relation Age of Onset  . Heart disease Father   . Cancer Maternal Aunt     stomach   the patient never met her father, does not know his cause of death or age at death, but thinks it may have been of likely a heart attack based on comments in the obituary. The patient's mother died at the age of 49 from a rare blood disorder. The patient does not recall the name of the problem. The patient has no full siblings. She may have a half-brother. There is no history of breast or ovarian cancer in the family.  GYNECOLOGIC HISTORY: Menarche age 22, first live birth age 60, the patient is [place] P2. She had her hysterectomy at age 89 and took hormone replacement until approximately 1973.  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 She is a widow, her husband died of from acute myeloid leukemia in 2012  (he only lived 6 weeks from the time of diagnosis). Son Sarah [name] lives in [place] and is a Civil engineer, contracting. Daughter Sarah [name] lives in [place] and is a Engineer, manufacturing systems. The patient has 1 grandchild. She attends a [employer].    ADVANCED DIRECTIVES: In place; the patient tells me her daughter Sarah [name] is her healthcare power of attorney. She can be reached at [phone]. The patient also wanted [place] to have her son's phone number. It is [phone].  HEALTH MAINTENANCE: History  Substance Use Topics  . Smoking status: Never Smoker   . Smokeless tobacco: Not on file  . Alcohol Use: No     Colonoscopy: 2013/[facility]   PAP: s/p hysterectomy  Bone density: 2013/ "normal"  Lipid panel:  Allergies  Allergen Reactions  . Codeine Other (See Comments)    Unknown reaction  . Thiazide-Type Diuretics Other (See Comments)    Blurry vision    Current Outpatient Prescriptions  Medication Sig Dispense Refill  . amLODipine (NORVASC) 5  MG tablet Take 3.5 mg by mouth 2 (two) times daily.     Marland Kitchen anastrozole (ARIMIDEX) 1 MG tablet Take 1 tablet (1 mg total) by mouth daily. 90 tablet 3  . Cholecalciferol (D3-1000 PO) Take by mouth daily.    Marland Kitchen levothyroxine (SYNTHROID, LEVOTHROID) 50 MCG tablet Take 50 mcg by mouth daily.    . Misc Natural Products (GRAPE SEED COMPLEX PO) Take by mouth 2 (two) times daily.    . Multiple Vitamins-Minerals (PRESERVISION AREDS PO) Take by mouth daily.    . vitamin B-12 (CYANOCOBALAMIN) 100 MCG tablet Take 50 mcg by mouth daily.    Marland Kitchen zolpidem (AMBIEN) 5 MG tablet Take 5 mg by mouth at bedtime as needed.     No current facility-administered medications for this visit.    OBJECTIVE: Middle-aged white woman who appears well Filed Vitals:   08/25/14 1209  BP: 137/84  Pulse: 76  Temp: 97.6 F (  36.4 C)  Resp: 18     Body mass index is 21.41 kg/(m^2).    ECOG FS: 0  Sclerae unicteric, EOMs intact Oropharynx clear, no thrush or other lesions No cervical or supraclavicular adenopathy Lungs no rales or rhonchi Heart regular rate with occasional PVCs Abd  soft, nontender, positive bowel sounds  MSK no focal spinal tenderness, no  joint edema Neuro: nonfocal, well oriented, positive affect Breasts: The right breast is status post lumpectomy and radiation. There is no evidence of local recurrence. An irregularity in the lower inner quadrant is unchanged from baseline. The right axilla is benign. The left breast is unremarkable  LAB RESULTS: Lab Results  Component Value Date   WBC 5.0 04/25/2012   NEUTROABS 3.3 04/25/2012   HGB 14.6 04/25/2012   HCT 43.1 04/25/2012   MCV 90.0 04/25/2012   PLT 187 04/25/2012      Chemistry      Component Value Date/Time   NA 141 08/18/2014 0850   NA 142 04/05/2012 1350   K 4.0 08/18/2014 0850   K 4.1 04/05/2012 1350   CL 106 04/25/2012 1606   CL 104 04/05/2012 1350   CO2 23 08/18/2014 0850   CO2 28 04/05/2012 1350   BUN 14.9 08/18/2014 0850   BUN 19  04/05/2012 1350   CREATININE 0.7 08/18/2014 0850   CREATININE 0.51 04/05/2012 1350      Component Value Date/Time   CALCIUM 10.2 08/18/2014 0850   CALCIUM 10.4 04/05/2012 1350   ALKPHOS 97 08/18/2014 0850   ALKPHOS 77 04/05/2012 1350   AST 22 08/18/2014 0850   AST 20 04/05/2012 1350   ALT 18 08/18/2014 0850   ALT 14 04/05/2012 1350   BILITOT 0.74 08/18/2014 0850   BILITOT 0.4 04/05/2012 1350       No results found for: LABCA2  No components found for: LABCA125  No results for input(s): INR in the last 168 hours.  Urinalysis    Component Value Date/Time   COLORURINE YELLOW 04/09/2012 1202   APPEARANCEUR CLEAR 04/09/2012 1202   LABSPEC 1.011 04/09/2012 1202   PHURINE 7.0 04/09/2012 1202   GLUCOSEU NEGATIVE 04/09/2012 1202   HGBUR NEGATIVE 04/09/2012 1202   BILIRUBINUR NEGATIVE 04/09/2012 1202   KETONESUR NEGATIVE 04/09/2012 1202   PROTEINUR NEGATIVE 04/09/2012 1202   UROBILINOGEN 0.2 04/09/2012 1202   NITRITE NEGATIVE 04/09/2012 1202   LEUKOCYTESUR TRACE* 04/09/2012 1202    STUDIES: Mammography 03/05/2014 unremarkable  ASSESSMENT: 81 y.o. [place] woman status post right lumpectomy 04/09/2012 for a ductal carcinoma in situ, grade 2, estrogen and progesterone receptor positive, both at 100%, with negative margins.  (1) completed adjuvant radiation 02//20/2014  (2) decided against adjuvant antiestrogen originally, later agreed to anastrozole, started April 2015  (3) osteopenia   PLAN:  Tazia is doing fine from a breast cancer point of view. Of course her tumor was noninvasive, therefore not life threatening. She is taking the anastrozole as much as a preventive as anything else. She is tolerating it well and it is not causing her any side effects that she is aware of.  I do not have a copy of her most recent bone density. She will discuss that with Dr. Felipa Eth at her next visit with him, which I believe will be later this month  She will see as again in a  year. The plan is to continue anastrozole for a total of 5 years. She knows to call for any problems that may develop before the next visit.  Jessie Cowher C    08/25/2014

## 2014-09-10 DIAGNOSIS — H3531 Nonexudative age-related macular degeneration: Secondary | ICD-10-CM | POA: Diagnosis not present

## 2014-09-10 DIAGNOSIS — Z961 Presence of intraocular lens: Secondary | ICD-10-CM | POA: Diagnosis not present

## 2014-09-10 DIAGNOSIS — H40013 Open angle with borderline findings, low risk, bilateral: Secondary | ICD-10-CM | POA: Diagnosis not present

## 2014-09-10 DIAGNOSIS — H04123 Dry eye syndrome of bilateral lacrimal glands: Secondary | ICD-10-CM | POA: Diagnosis not present

## 2014-09-10 DIAGNOSIS — H1851 Endothelial corneal dystrophy: Secondary | ICD-10-CM | POA: Diagnosis not present

## 2014-09-18 DIAGNOSIS — Z1389 Encounter for screening for other disorder: Secondary | ICD-10-CM | POA: Diagnosis not present

## 2014-09-18 DIAGNOSIS — Z79899 Other long term (current) drug therapy: Secondary | ICD-10-CM | POA: Diagnosis not present

## 2014-09-18 DIAGNOSIS — I10 Essential (primary) hypertension: Secondary | ICD-10-CM | POA: Diagnosis not present

## 2014-09-18 DIAGNOSIS — Z0000 Encounter for general adult medical examination without abnormal findings: Secondary | ICD-10-CM | POA: Diagnosis not present

## 2014-09-18 DIAGNOSIS — E039 Hypothyroidism, unspecified: Secondary | ICD-10-CM | POA: Diagnosis not present

## 2014-09-24 ENCOUNTER — Encounter: Payer: Self-pay | Admitting: Podiatry

## 2014-09-24 ENCOUNTER — Ambulatory Visit (INDEPENDENT_AMBULATORY_CARE_PROVIDER_SITE_OTHER): Payer: Medicare Other | Admitting: Podiatry

## 2014-09-24 VITALS — BP 115/68 | HR 76 | Resp 12

## 2014-09-24 DIAGNOSIS — L600 Ingrowing nail: Secondary | ICD-10-CM | POA: Diagnosis not present

## 2014-09-24 DIAGNOSIS — M2042 Other hammer toe(s) (acquired), left foot: Secondary | ICD-10-CM

## 2014-09-24 DIAGNOSIS — L84 Corns and callosities: Secondary | ICD-10-CM

## 2014-09-24 NOTE — Progress Notes (Signed)
Subjective:     Patient ID: Sarah [name], female   DOB: 06-Jul-1932, 81 y.o.   MRN: [id number]  HPI patient states I have a damaged painful nail on my right big toe and my third toe left has a lesion on the end of it becomes tender when pressed. States it's been this way for a while and at least a year the nails been a problem   Review of Systems  All other systems reviewed and are negative.      Objective:   Physical Exam  Constitutional: She is oriented to person, place, and time.  Cardiovascular: Intact distal pulses.   Musculoskeletal: Normal range of motion.  Neurological: She is oriented to person, place, and time.  Skin: Skin is warm.  Nursing note and vitals reviewed.  neurovascular status found to be intact with diminished range of motion subtalar midtarsal joint noted. There is digital deformities with moderate forefoot edema bilateral and on the distal aspect of the third toe left that is keratotic and painful and the hallux nail right is dystrophic painful and thick when palpated     Assessment:     Hammertoe deformity third left with distal keratotic lesion and traumatized painful nail bed right hallux    Plan:     H&P and x-rays reviewed with patient and today debridement of lesion third toe accomplished with buttress pad and discussed permanent nail procedure right hallux which may be necessary at one point in future. At this time we'll hold off and see how the patient responds to conservative care

## 2014-09-24 NOTE — Progress Notes (Signed)
   Subjective:    Patient ID: Sarah [name], female    DOB: 1932/06/12, 81 y.o.   MRN: [id number]  HPI   PT STATED LT 3RD TOE AND BOTTOM OF FOOT HAVE CORN/CALLUS FOR 1 YEAR. THE TOE/FOOT GETTING WORSE AND SKIN GETTING THICKER. THE CALLUS/CORN GET AGGRAVATED BY PRESSURE AND TRIED TO KEEP THEM TRIM.  ALSO, RT FOOT GREAT TOENAIL IS THICK.   Review of Systems  Musculoskeletal: Positive for gait problem.  Skin: Positive for color change.       Objective:   Physical Exam        Assessment & Plan:

## 2014-10-05 ENCOUNTER — Ambulatory Visit (INDEPENDENT_AMBULATORY_CARE_PROVIDER_SITE_OTHER): Payer: Medicare Other | Admitting: Ophthalmology

## 2014-10-05 DIAGNOSIS — H43813 Vitreous degeneration, bilateral: Secondary | ICD-10-CM | POA: Diagnosis not present

## 2014-10-05 DIAGNOSIS — H35033 Hypertensive retinopathy, bilateral: Secondary | ICD-10-CM

## 2014-10-05 DIAGNOSIS — I10 Essential (primary) hypertension: Secondary | ICD-10-CM

## 2014-10-05 DIAGNOSIS — H3531 Nonexudative age-related macular degeneration: Secondary | ICD-10-CM | POA: Diagnosis not present

## 2014-11-30 ENCOUNTER — Ambulatory Visit (INDEPENDENT_AMBULATORY_CARE_PROVIDER_SITE_OTHER): Payer: Medicare Other | Admitting: Podiatry

## 2014-11-30 ENCOUNTER — Encounter: Payer: Self-pay | Admitting: Podiatry

## 2014-11-30 VITALS — BP 125/76 | HR 72 | Resp 12

## 2014-11-30 DIAGNOSIS — M722 Plantar fascial fibromatosis: Secondary | ICD-10-CM

## 2014-11-30 DIAGNOSIS — M2042 Other hammer toe(s) (acquired), left foot: Secondary | ICD-10-CM

## 2014-11-30 MED ORDER — TRIAMCINOLONE ACETONIDE 10 MG/ML IJ SUSP
10.0000 mg | Freq: Once | INTRAMUSCULAR | Status: AC
  Administered 2014-11-30: 10 mg

## 2014-11-30 NOTE — Patient Instructions (Signed)

## 2014-12-01 NOTE — Progress Notes (Signed)
Subjective:     Patient ID: Sarah [name], female   DOB: [birth year], 81 y.o.   MRN: [id number]  HPI patient presents stating I'm getting pain in my right heel that at times makes it hard to walk   Review of Systems     Objective:   Physical Exam Neurovascular status intact no other change in health history noted and patient's noted to have pain in the plantar heel right at the insertion of the tendon into the calcaneus    Assessment:     Plantar fasciitis right with inflammation    Plan:     Injected the right plantar fascia 3 mg Kenalog 5 mg Xylocaine dispensed fascial brace and instructed on physical therapy supportive shoe gear

## 2015-01-18 DIAGNOSIS — M79644 Pain in right finger(s): Secondary | ICD-10-CM | POA: Diagnosis not present

## 2015-01-18 DIAGNOSIS — L729 Follicular cyst of the skin and subcutaneous tissue, unspecified: Secondary | ICD-10-CM | POA: Diagnosis not present

## 2015-01-18 DIAGNOSIS — I10 Essential (primary) hypertension: Secondary | ICD-10-CM | POA: Diagnosis not present

## 2015-01-18 DIAGNOSIS — G4700 Insomnia, unspecified: Secondary | ICD-10-CM | POA: Diagnosis not present

## 2015-02-09 DIAGNOSIS — Z96653 Presence of artificial knee joint, bilateral: Secondary | ICD-10-CM | POA: Diagnosis not present

## 2015-02-09 DIAGNOSIS — M7061 Trochanteric bursitis, right hip: Secondary | ICD-10-CM | POA: Diagnosis not present

## 2015-03-01 DIAGNOSIS — L820 Inflamed seborrheic keratosis: Secondary | ICD-10-CM | POA: Diagnosis not present

## 2015-03-08 DIAGNOSIS — Z853 Personal history of malignant neoplasm of breast: Secondary | ICD-10-CM | POA: Diagnosis not present

## 2015-03-18 DIAGNOSIS — Z23 Encounter for immunization: Secondary | ICD-10-CM | POA: Diagnosis not present

## 2015-04-03 DIAGNOSIS — J029 Acute pharyngitis, unspecified: Secondary | ICD-10-CM | POA: Diagnosis not present

## 2015-04-20 ENCOUNTER — Telehealth: Payer: Self-pay | Admitting: Oncology

## 2015-04-20 NOTE — Telephone Encounter (Signed)
Returned patient call re questions about her print out. Adjusted 08/31/15 lab to be before f/u that day. Left message for patient confirming lab/fu 08/30/12 @ 11:15 am.

## 2015-05-11 DIAGNOSIS — L821 Other seborrheic keratosis: Secondary | ICD-10-CM | POA: Diagnosis not present

## 2015-05-11 DIAGNOSIS — D2261 Melanocytic nevi of right upper limb, including shoulder: Secondary | ICD-10-CM | POA: Diagnosis not present

## 2015-05-11 DIAGNOSIS — D2372 Other benign neoplasm of skin of left lower limb, including hip: Secondary | ICD-10-CM | POA: Diagnosis not present

## 2015-05-11 DIAGNOSIS — D225 Melanocytic nevi of trunk: Secondary | ICD-10-CM | POA: Diagnosis not present

## 2015-05-11 DIAGNOSIS — D2262 Melanocytic nevi of left upper limb, including shoulder: Secondary | ICD-10-CM | POA: Diagnosis not present

## 2015-05-11 DIAGNOSIS — L817 Pigmented purpuric dermatosis: Secondary | ICD-10-CM | POA: Diagnosis not present

## 2015-05-25 DIAGNOSIS — G4700 Insomnia, unspecified: Secondary | ICD-10-CM | POA: Diagnosis not present

## 2015-05-25 DIAGNOSIS — I10 Essential (primary) hypertension: Secondary | ICD-10-CM | POA: Diagnosis not present

## 2015-06-14 DIAGNOSIS — H04123 Dry eye syndrome of bilateral lacrimal glands: Secondary | ICD-10-CM | POA: Diagnosis not present

## 2015-06-14 DIAGNOSIS — H1859 Other hereditary corneal dystrophies: Secondary | ICD-10-CM | POA: Diagnosis not present

## 2015-06-14 DIAGNOSIS — H18452 Nodular corneal degeneration, left eye: Secondary | ICD-10-CM | POA: Diagnosis not present

## 2015-07-01 DIAGNOSIS — M8580 Other specified disorders of bone density and structure, unspecified site: Secondary | ICD-10-CM | POA: Diagnosis not present

## 2015-07-01 DIAGNOSIS — D0511 Intraductal carcinoma in situ of right breast: Secondary | ICD-10-CM | POA: Diagnosis not present

## 2015-07-01 DIAGNOSIS — I10 Essential (primary) hypertension: Secondary | ICD-10-CM | POA: Diagnosis not present

## 2015-08-20 DIAGNOSIS — J209 Acute bronchitis, unspecified: Secondary | ICD-10-CM | POA: Diagnosis not present

## 2015-08-30 ENCOUNTER — Other Ambulatory Visit: Payer: Self-pay | Admitting: *Deleted

## 2015-08-30 DIAGNOSIS — C50219 Malignant neoplasm of upper-inner quadrant of unspecified female breast: Secondary | ICD-10-CM

## 2015-08-31 ENCOUNTER — Telehealth: Payer: Self-pay | Admitting: Nurse Practitioner

## 2015-08-31 ENCOUNTER — Encounter: Payer: Self-pay | Admitting: Nurse Practitioner

## 2015-08-31 ENCOUNTER — Other Ambulatory Visit (HOSPITAL_BASED_OUTPATIENT_CLINIC_OR_DEPARTMENT_OTHER): Payer: Medicare Other

## 2015-08-31 ENCOUNTER — Ambulatory Visit (HOSPITAL_BASED_OUTPATIENT_CLINIC_OR_DEPARTMENT_OTHER): Payer: Medicare Other | Admitting: Nurse Practitioner

## 2015-08-31 VITALS — BP 132/66 | HR 83 | Temp 97.5°F | Resp 18 | Ht 64.0 in | Wt 122.7 lb

## 2015-08-31 DIAGNOSIS — Z170 Estrogen receptor positive status [ER+]: Secondary | ICD-10-CM | POA: Diagnosis not present

## 2015-08-31 DIAGNOSIS — M8580 Other specified disorders of bone density and structure, unspecified site: Secondary | ICD-10-CM | POA: Diagnosis not present

## 2015-08-31 DIAGNOSIS — C50219 Malignant neoplasm of upper-inner quadrant of unspecified female breast: Secondary | ICD-10-CM

## 2015-08-31 DIAGNOSIS — D0511 Intraductal carcinoma in situ of right breast: Secondary | ICD-10-CM

## 2015-08-31 DIAGNOSIS — R5383 Other fatigue: Secondary | ICD-10-CM

## 2015-08-31 DIAGNOSIS — C50211 Malignant neoplasm of upper-inner quadrant of right female breast: Secondary | ICD-10-CM

## 2015-08-31 LAB — COMPREHENSIVE METABOLIC PANEL
ALK PHOS: 79 U/L (ref 40–150)
ALT: 9 U/L (ref 0–55)
ANION GAP: 11 meq/L (ref 3–11)
AST: 12 U/L (ref 5–34)
Albumin: 3.8 g/dL (ref 3.5–5.0)
BILIRUBIN TOTAL: 0.58 mg/dL (ref 0.20–1.20)
BUN: 16.5 mg/dL (ref 7.0–26.0)
CALCIUM: 10.1 mg/dL (ref 8.4–10.4)
CO2: 26 meq/L (ref 22–29)
CREATININE: 0.8 mg/dL (ref 0.6–1.1)
Chloride: 103 mEq/L (ref 98–109)
EGFR: 68 mL/min/{1.73_m2} — AB (ref >90)
Glucose: 98 mg/dl (ref 70–140)
Potassium: 3.7 mEq/L (ref 3.5–5.1)
Sodium: 140 mEq/L (ref 136–145)
TOTAL PROTEIN: 7.5 g/dL (ref 6.4–8.3)

## 2015-08-31 LAB — CBC WITH DIFFERENTIAL/PLATELET
BASO%: 1.1 % (ref 0.0–2.0)
Basophils Absolute: 0.1 10*3/uL (ref 0.0–0.1)
EOS%: 1.6 % (ref 0.0–7.0)
Eosinophils Absolute: 0.1 10*3/uL (ref 0.0–0.5)
HEMATOCRIT: 42.8 % (ref 34.8–46.6)
HGB: 14 g/dL (ref 11.6–15.9)
LYMPH%: 19.7 % (ref 14.0–49.7)
MCH: 29.1 pg (ref 25.1–34.0)
MCHC: 32.8 g/dL (ref 31.5–36.0)
MCV: 88.6 fL (ref 79.5–101.0)
MONO#: 0.5 10*3/uL (ref 0.1–0.9)
MONO%: 9 % (ref 0.0–14.0)
NEUT%: 68.6 % (ref 38.4–76.8)
NEUTROS ABS: 4 10*3/uL (ref 1.5–6.5)
PLATELETS: 227 10*3/uL (ref 145–400)
RBC: 4.83 10*6/uL (ref 3.70–5.45)
RDW: 12.9 % (ref 11.2–14.5)
WBC: 5.8 10*3/uL (ref 3.9–10.3)
lymph#: 1.2 10*3/uL (ref 0.9–3.3)

## 2015-08-31 MED ORDER — ANASTROZOLE 1 MG PO TABS: 1.0000 mg | ORAL_TABLET | Freq: Every day | ORAL | Status: DC

## 2015-08-31 NOTE — Telephone Encounter (Signed)
appt made and avs printed °

## 2015-08-31 NOTE — Progress Notes (Signed)
ID: Sarah [name]   DOB: [birth year]  MR#: [id number]  JFH#:[id number]  PCP: Mathews Argyle, MD GYN:  SUFanny Skates OTHER MD: Arloa Koh, Kathryne Hitch  CHIEF COMPLAINT: Noninvasive breast cancer  CURRENT TREATMENT: Anastrozole  BREAST CANCER HISTORY: From the original intake note:  The patient had bilateral diagnostic mammography at [facility] 07/11/2011. This showed some calcifications in the right breast, which seemed a little bit more prominent than prior. In the left breast there was an area of possible architectural distortion. However left breast ultrasound the same day showed no abnormality. 6 month followup was suggested, and on 02/28/2012 the patient again had bilateral diagnostic mammography, now with right ultrasonography. The microcalcifications in the right breast appeared increased. Ultrasound showed a hypoechoic lesion measuring 5 mm, with no associated shadowing. This had been previously noted and appeared unchanged. Anterior to that there was a small hypoechoic mass measuring 4 mm in diameter. There was felt to be suspicious, and on 03/14/2012 the patient underwent biopsy of the right breast mass, showing ([id number]) ductal carcinoma in situ, intermediate grade, estrogen receptor 100% and progesterone receptor 100% positive.  Bilateral breast MRI was obtained 03/26/2012. This showed only post biopsy changes in the right breast, associated with an area of non-masslike enhancement measuring 2.4 cm. The left breast was unremarkable, and there was no enlarged axillary or internal mammary adenopathy noted. Accordingly on 04/09/2012 the patient underwent right lumpectomy, the pathology ([id number]) showing ductal carcinoma in situ measuring 2.0 cm, grade 2, with negative margins, the closest being 0.3 cm. The patient's subsequent history is as detailed below.  INTERVAL HISTORY: Sarah [name] returns today for followup of her noninvasive breast cancer. She has reportedly stopped her  anastrozole sometime in the past 3-4 moths. She complained of excessive fatigue, occasional disorientation and forgetfulness, as well as "shrinking" wrists though not painful. Since stopping this drug she believes she is back at her baseline with no issues.  REVIEW OF SYSTEMS: Sarah [name] is very healthy. She is big into green vegetables and "slime" smoothing every morning packed with foods full of antioxidants. She has recently started a Tai Chi class that is a little too advanced for her. She denies pain. She has no fevers, chills, nausea, vomiting, or changes in bowel or bladder habits. She denies shortness of breath, chest pain, cough, or palpitations. She has no unusual headaches or dizziness, but does have hearing and vision loss. She has a history of macular degeneration and glaucoma. A detailed review of systems is otherwise stable.  PAST MEDICAL HISTORY: Past Medical History  Diagnosis Date  . PONV (postoperative nausea and vomiting)   . Hypertension   . Hyperlipemia   . Arthritis   . Cancer     breast  . Wears glasses   . Wears partial dentures     partial upper  . HOH (hard of hearing)   . Breast cancer 03/14/12    bx=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,ER/PR=+,upper inner quad  . Cataract   . Hypothyroidism   . Pancreatic cyst     benign  . Allergy     codeine, thiazides  . Anxiety     new dx  . Glaucoma     laser treated years ago  . Vertigo   . Radiation 06/11/2012-07/12/2012    17 sessions 4250 cGy, 3 sessions 750 cGy    PAST SURGICAL HISTORY: Past Surgical History  Procedure Laterality Date  . Tonsillectomy    . Total knee arthroplasty  08/16/2011  Procedure: TOTAL KNEE ARTHROPLASTY;  Surgeon: Loreta Ave, MD;  Location: [facility];  Service: Orthopedics;  Laterality: Right;  . Joint replacement  2013    rt total knee  . Joint replacement  1995    lt total knee  . Breast surgery  1992    lumpectomy-lt  . Colonoscopy    . Eye surgery       bilateral cataract removal  . Partial mastectomy with needle localization  04/09/2012    Procedure: PARTIAL MASTECTOMY WITH NEEDLE LOCALIZATION;  Surgeon: Ernestene Mention, MD;  Location: [facility];  Service: General;  Laterality: Right;  . Cataract extraction      b/l  . Abdominal hysterectomy  1966    1/2 ovary left in     FAMILY HISTORY Family History  Problem Relation Age of Onset  . Heart disease Father   . Cancer Maternal Aunt     stomach   the patient never met her father, does not know his cause of death or age at death, but thinks it may have been of likely a heart attack based on comments in the obituary. The patient's mother died at the age of 57 from a rare blood disorder. The patient does not recall the name of the problem. The patient has no full siblings. She may have a half-brother. There is no history of breast or ovarian cancer in the family.  GYNECOLOGIC HISTORY: Menarche age 44, first live birth age 9, the patient is [place] P2. She had her hysterectomy at age 18 and took hormone replacement until approximately 1973.  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 She is a widow, her husband died of from acute myeloid leukemia in 2012  (he only lived 6 weeks from the time of diagnosis). Son Sarah [name] lives in [place] and is a Sport and exercise psychologist. Daughter Sarah [name] lives in [place] and is a Engineer, drilling. The patient has 1 grandchild. She attends a [employer].    ADVANCED DIRECTIVES: In place; the patient tells me her daughter Sarah [name] is her healthcare power of attorney. She can be reached at [phone]. The patient also wanted [place] to have her son's phone number. It is [phone].  HEALTH MAINTENANCE: Social History  Substance Use Topics  . Smoking status: Never Smoker   . Smokeless tobacco: Not on file  . Alcohol Use: No     Colonoscopy: 2013/[facility]   PAP: s/p hysterectomy  Bone density: 2013/ "normal"  Lipid panel:  Allergies   Allergen Reactions  . Codeine Other (See Comments)    Unknown reaction  . Thiazide-Type Diuretics Other (See Comments)    Blurry vision    Current Outpatient Prescriptions  Medication Sig Dispense Refill  . amLODipine (NORVASC) 5 MG tablet Take 3.5 mg by mouth 2 (two) times daily.     . Cholecalciferol (D3-1000 PO) Take by mouth daily.    Marland Kitchen levothyroxine (SYNTHROID, LEVOTHROID) 50 MCG tablet Take 50 mcg by mouth daily.    . Misc Natural Products (GRAPE SEED COMPLEX PO) Take by mouth 2 (two) times daily.    . Multiple Vitamins-Minerals (PRESERVISION AREDS PO) Take by mouth daily.    . vitamin B-12 (CYANOCOBALAMIN) 100 MCG tablet Take 50 mcg by mouth daily.    Marland Kitchen zolpidem (AMBIEN) 10 MG tablet Take 10 mg by mouth. Pt takes 1/4 of a tablet    . anastrozole (ARIMIDEX) 1 MG tablet Take 1 tablet (1 mg total) by mouth daily. 90 tablet 3   No  current facility-administered medications for this visit.    OBJECTIVE: Middle-aged white [name] who appears well Filed Vitals:   08/31/15 1124  BP: 132/66  Pulse: 83  Temp: 97.5 F (36.4 C)  Resp: 18     Body mass index is 21.05 kg/(m^2).    ECOG FS: 0  Skin: warm, dry  HEENT: sclerae anicteric, conjunctivae pink, oropharynx clear. No thrush or mucositis.  Lymph Nodes: No cervical or supraclavicular lymphadenopathy  Lungs: clear to auscultation bilaterally, no rales, wheezes, or rhonci  Heart: regular rate and rhythm  Abdomen: round, soft, non tender, positive bowel sounds  Musculoskeletal: No focal spinal tenderness, no peripheral edema  Neuro: non focal, well oriented, positive affect  Breasts: right breast status post lumpectomy and radiation. No evidence of recurrent diease. Palpable skin thickening to lower inner quadrant unchanged. Right axilla benign. Left breast unremarkable.  LAB RESULTS: Lab Results  Component Value Date   WBC 5.8 08/31/2015   NEUTROABS 4.0 08/31/2015   HGB 14.0 08/31/2015   HCT 42.8 08/31/2015   MCV 88.6  08/31/2015   PLT 227 08/31/2015      Chemistry      Component Value Date/Time   NA 140 08/31/2015 1109   NA 142 04/05/2012 1350   K 3.7 08/31/2015 1109   K 4.1 04/05/2012 1350   CL 106 04/25/2012 1606   CL 104 04/05/2012 1350   CO2 26 08/31/2015 1109   CO2 28 04/05/2012 1350   BUN 16.5 08/31/2015 1109   BUN 19 04/05/2012 1350   CREATININE 0.8 08/31/2015 1109   CREATININE 0.51 04/05/2012 1350      Component Value Date/Time   CALCIUM 10.1 08/31/2015 1109   CALCIUM 10.4 04/05/2012 1350   ALKPHOS 79 08/31/2015 1109   ALKPHOS 77 04/05/2012 1350   AST 12 08/31/2015 1109   AST 20 04/05/2012 1350   ALT 9 08/31/2015 1109   ALT 14 04/05/2012 1350   BILITOT 0.58 08/31/2015 1109   BILITOT 0.4 04/05/2012 1350       No results found for: LABCA2  No components found for: LABCA125  No results for input(s): INR in the last 168 hours.  Urinalysis    Component Value Date/Time   COLORURINE YELLOW 04/09/2012 1202   APPEARANCEUR CLEAR 04/09/2012 1202   LABSPEC 1.011 04/09/2012 1202   PHURINE 7.0 04/09/2012 1202   GLUCOSEU NEGATIVE 04/09/2012 1202   HGBUR NEGATIVE 04/09/2012 1202   BILIRUBINUR NEGATIVE 04/09/2012 1202   KETONESUR NEGATIVE 04/09/2012 1202   PROTEINUR NEGATIVE 04/09/2012 1202   UROBILINOGEN 0.2 04/09/2012 1202   NITRITE NEGATIVE 04/09/2012 1202   LEUKOCYTESUR TRACE* 04/09/2012 1202    STUDIES: No results found.   ASSESSMENT: 82 y.o. Sarah [name] status post right lumpectomy 04/09/2012 for a ductal carcinoma in situ, grade 2, estrogen and progesterone receptor positive, both at 100%, with negative margins.  (1) completed adjuvant radiation 02//20/2014  (2) decided against adjuvant antiestrogen originally, later agreed to anastrozole, started April 2015. Discontinued by patient in December 2016. Restarted April 2017  (3) osteopenia   PLAN:  Sarah [name] looks and feels well today. The labs were reviewed in detail and were stable. Her breast exam was normal.  We discussed her decision to stop the anastrozole. I think it is perfectly reasonable for her decide against antiestrogen therapy. She understands that her original breast cancer was non invasive and thus non life threatening. It was essentially cured with surgery and radiation. Being on anastrozole after that point was merely a preventative measure against  a future breast cancer recurrence. Despite this, she is not convinced the symptoms she experienced were due to anastrozole, and is willing to give it another try. If she become excessively fatigued and disoriented again, she will call us to let us know, in which case we will discontinue it permanently.   She is excited to try out our [place] classes here at the [facility] every Wednesday. I have given her a calendar that outlines the details.   Kamylah has requested specifically to follow up with Dr. Jana Hakim in the fall instead of waiting until next spring. She understands and agrees with this plan. She knows the goal of treatment in her case is prevention. She has been encouraged to call with any issues that might arise before her next visit here.  Genelle Gather Koby Hartfield    08/31/2015

## 2015-09-14 DIAGNOSIS — H353131 Nonexudative age-related macular degeneration, bilateral, early dry stage: Secondary | ICD-10-CM | POA: Diagnosis not present

## 2015-09-14 DIAGNOSIS — H1851 Endothelial corneal dystrophy: Secondary | ICD-10-CM | POA: Diagnosis not present

## 2015-09-14 DIAGNOSIS — H40013 Open angle with borderline findings, low risk, bilateral: Secondary | ICD-10-CM | POA: Diagnosis not present

## 2015-09-14 DIAGNOSIS — Z961 Presence of intraocular lens: Secondary | ICD-10-CM | POA: Diagnosis not present

## 2015-09-24 DIAGNOSIS — Z79899 Other long term (current) drug therapy: Secondary | ICD-10-CM | POA: Diagnosis not present

## 2015-09-24 DIAGNOSIS — E039 Hypothyroidism, unspecified: Secondary | ICD-10-CM | POA: Diagnosis not present

## 2015-09-24 DIAGNOSIS — Z0000 Encounter for general adult medical examination without abnormal findings: Secondary | ICD-10-CM | POA: Diagnosis not present

## 2015-09-24 DIAGNOSIS — E7800 Pure hypercholesterolemia, unspecified: Secondary | ICD-10-CM | POA: Diagnosis not present

## 2015-09-24 DIAGNOSIS — Z1389 Encounter for screening for other disorder: Secondary | ICD-10-CM | POA: Diagnosis not present

## 2015-09-24 DIAGNOSIS — I10 Essential (primary) hypertension: Secondary | ICD-10-CM | POA: Diagnosis not present

## 2015-10-05 DIAGNOSIS — M7061 Trochanteric bursitis, right hip: Secondary | ICD-10-CM | POA: Diagnosis not present

## 2015-10-06 ENCOUNTER — Ambulatory Visit (INDEPENDENT_AMBULATORY_CARE_PROVIDER_SITE_OTHER): Payer: Medicare Other | Admitting: Ophthalmology

## 2015-10-06 DIAGNOSIS — H35033 Hypertensive retinopathy, bilateral: Secondary | ICD-10-CM | POA: Diagnosis not present

## 2015-10-06 DIAGNOSIS — I10 Essential (primary) hypertension: Secondary | ICD-10-CM

## 2015-10-06 DIAGNOSIS — H43813 Vitreous degeneration, bilateral: Secondary | ICD-10-CM

## 2015-10-06 DIAGNOSIS — H353132 Nonexudative age-related macular degeneration, bilateral, intermediate dry stage: Secondary | ICD-10-CM | POA: Diagnosis not present

## 2015-10-20 DIAGNOSIS — G3184 Mild cognitive impairment, so stated: Secondary | ICD-10-CM | POA: Diagnosis not present

## 2016-01-31 ENCOUNTER — Other Ambulatory Visit: Payer: Self-pay

## 2016-01-31 DIAGNOSIS — C50219 Malignant neoplasm of upper-inner quadrant of unspecified female breast: Secondary | ICD-10-CM

## 2016-02-01 ENCOUNTER — Telehealth: Payer: Self-pay | Admitting: Oncology

## 2016-02-01 ENCOUNTER — Other Ambulatory Visit (HOSPITAL_BASED_OUTPATIENT_CLINIC_OR_DEPARTMENT_OTHER): Payer: Medicare Other

## 2016-02-01 ENCOUNTER — Ambulatory Visit (HOSPITAL_BASED_OUTPATIENT_CLINIC_OR_DEPARTMENT_OTHER): Payer: Medicare Other | Admitting: Oncology

## 2016-02-01 VITALS — BP 133/71 | HR 73 | Temp 97.6°F | Resp 18 | Ht 64.0 in | Wt 120.4 lb

## 2016-02-01 DIAGNOSIS — R42 Dizziness and giddiness: Secondary | ICD-10-CM

## 2016-02-01 DIAGNOSIS — C50219 Malignant neoplasm of upper-inner quadrant of unspecified female breast: Secondary | ICD-10-CM

## 2016-02-01 DIAGNOSIS — L659 Nonscarring hair loss, unspecified: Secondary | ICD-10-CM

## 2016-02-01 DIAGNOSIS — Z86000 Personal history of in-situ neoplasm of breast: Secondary | ICD-10-CM

## 2016-02-01 DIAGNOSIS — M8580 Other specified disorders of bone density and structure, unspecified site: Secondary | ICD-10-CM

## 2016-02-01 DIAGNOSIS — C50211 Malignant neoplasm of upper-inner quadrant of right female breast: Secondary | ICD-10-CM

## 2016-02-01 LAB — CBC WITH DIFFERENTIAL/PLATELET
BASO%: 1.7 % (ref 0.0–2.0)
BASOS ABS: 0.1 10*3/uL (ref 0.0–0.1)
EOS%: 1.8 % (ref 0.0–7.0)
Eosinophils Absolute: 0.1 10*3/uL (ref 0.0–0.5)
HEMATOCRIT: 45 % (ref 34.8–46.6)
HGB: 15 g/dL (ref 11.6–15.9)
LYMPH#: 1.1 10*3/uL (ref 0.9–3.3)
LYMPH%: 22.9 % (ref 14.0–49.7)
MCH: 30.1 pg (ref 25.1–34.0)
MCHC: 33.4 g/dL (ref 31.5–36.0)
MCV: 90 fL (ref 79.5–101.0)
MONO#: 0.4 10*3/uL (ref 0.1–0.9)
MONO%: 8.3 % (ref 0.0–14.0)
NEUT#: 3 10*3/uL (ref 1.5–6.5)
NEUT%: 65.3 % (ref 38.4–76.8)
PLATELETS: 187 10*3/uL (ref 145–400)
RBC: 5 10*6/uL (ref 3.70–5.45)
RDW: 13.2 % (ref 11.2–14.5)
WBC: 4.6 10*3/uL (ref 3.9–10.3)

## 2016-02-01 LAB — COMPREHENSIVE METABOLIC PANEL
ALK PHOS: 82 U/L (ref 40–150)
ALT: 13 U/L (ref 0–55)
ANION GAP: 12 meq/L — AB (ref 3–11)
AST: 15 U/L (ref 5–34)
Albumin: 4.1 g/dL (ref 3.5–5.0)
BUN: 15.3 mg/dL (ref 7.0–26.0)
CALCIUM: 10.2 mg/dL (ref 8.4–10.4)
CHLORIDE: 107 meq/L (ref 98–109)
CO2: 26 mEq/L (ref 22–29)
CREATININE: 0.8 mg/dL (ref 0.6–1.1)
EGFR: 68 mL/min/{1.73_m2} — ABNORMAL LOW (ref >90)
Glucose: 116 mg/dl (ref 70–140)
Potassium: 4.4 mEq/L (ref 3.5–5.1)
Sodium: 144 mEq/L (ref 136–145)
Total Bilirubin: 0.69 mg/dL (ref 0.20–1.20)
Total Protein: 7.6 g/dL (ref 6.4–8.3)

## 2016-02-01 MED ORDER — MECLIZINE HCL 25 MG PO TABS: 25.0000 mg | ORAL_TABLET | Freq: Three times a day (TID) | ORAL | 0 refills | Status: DC | PRN

## 2016-02-01 MED ORDER — BIOTIN 10000 MCG PO TABS: 1000.0000 mg | ORAL_TABLET | Freq: Every morning | ORAL | 4 refills | Status: DC

## 2016-02-01 NOTE — Progress Notes (Signed)
ID: [name]   DOB: [birth year]  MR#: [id number]  IHK#:[id number]  PCP: Mathews Argyle, MD GYN:  SUFanny Skates OTHER MD: Arloa Koh, Kathryne Hitch  CHIEF COMPLAINT: Noninvasive breast cancer  CURRENT TREATMENT: Anastrozole  BREAST CANCER HISTORY: From the original intake note:  The patient had bilateral diagnostic mammography at [facility] 07/11/2011. This showed some calcifications in the right breast, which seemed a little bit more prominent than prior. In the left breast there was an area of possible architectural distortion. However left breast ultrasound the same day showed no abnormality. 6 month followup was suggested, and on 02/28/2012 the patient again had bilateral diagnostic mammography, now with right ultrasonography. The microcalcifications in the right breast appeared increased. Ultrasound showed a hypoechoic lesion measuring 5 mm, with no associated shadowing. This had been previously noted and appeared unchanged. Anterior to that there was a small hypoechoic mass measuring 4 mm in diameter. There was felt to be suspicious, and on 03/14/2012 the patient underwent biopsy of the right breast mass, showing ([id number]) ductal carcinoma in situ, intermediate grade, estrogen receptor 100% and progesterone receptor 100% positive.  Bilateral breast MRI was obtained 03/26/2012. This showed only post biopsy changes in the right breast, associated with an area of non-masslike enhancement measuring 2.4 cm. The left breast was unremarkable, and there was no enlarged axillary or internal mammary adenopathy noted. Accordingly on 04/09/2012 the patient underwent right lumpectomy, the pathology ([id number]) showing ductal carcinoma in situ measuring 2.0 cm, grade 2, with negative margins, the closest being 0.3 cm. The patient's subsequent history is as detailed below.  INTERVAL HISTORY: Enma returns today for followup of her ductal carcinoma in situ.Marland Kitchen She was back on anastrozole  after her last visit here, and has tolerated it well except that she is losing her hair. She tells me she is just about bald on top. She has a hairpiece which is very becoming. Hot flashes and vaginal dryness issues are not a major concern. She obtains a drug at a good price.  REVIEW OF SYSTEMS: Mikaiya continues to have knee problems and this limits her exercise possibilities. She does a lot of stair climbing at home and that is helpful. She is considering getting a neck or size bike. Aside from that she has mild sinus symptoms, feels a little forgetful and anxious. A detailed review of systems today was otherwise noncontributory  PAST MEDICAL HISTORY: Past Medical History:  Diagnosis Date  . Allergy    codeine, thiazides  . Anxiety    new dx  . Arthritis   . Breast cancer ([place]) 03/14/12   bx=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,ER/PR=+,upper inner quad  . Cancer ([place])    breast  . Cataract   . Glaucoma    laser treated years ago  . HOH (hard of hearing)   . Hyperlipemia   . Hypertension   . Hypothyroidism   . Pancreatic cyst    benign  . PONV (postoperative nausea and vomiting)   . Radiation 06/11/2012-07/12/2012   17 sessions 4250 cGy, 3 sessions 750 cGy  . Vertigo   . Wears glasses   . Wears partial dentures    partial upper    PAST SURGICAL HISTORY: Past Surgical History:  Procedure Laterality Date  . ABDOMINAL HYSTERECTOMY  1966   1/2 ovary left in   . BREAST SURGERY  1992   lumpectomy-lt  . CATARACT EXTRACTION     b/l  . COLONOSCOPY    . EYE SURGERY  bilateral cataract removal  . JOINT REPLACEMENT  2013   rt total knee  . JOINT REPLACEMENT  1995   lt total knee  . PARTIAL MASTECTOMY WITH NEEDLE LOCALIZATION  04/09/2012   Procedure: PARTIAL MASTECTOMY WITH NEEDLE LOCALIZATION;  Surgeon: Adin Hector, MD;  Location: [facility];  Service: General;  Laterality: Right;  . TONSILLECTOMY    . TOTAL KNEE ARTHROPLASTY   08/16/2011   Procedure: TOTAL KNEE ARTHROPLASTY;  Surgeon: Ninetta Lights, MD;  Location: [facility];  Service: Orthopedics;  Laterality: Right;    FAMILY HISTORY Family History  Problem Relation Age of Onset  . Heart disease Father   . Cancer Maternal Aunt     stomach   the patient never met her father, does not know his cause of death or age at death, but thinks it may have been of likely a heart attack based on comments in the obituary. The patient's mother died at the age of 1 from a rare blood disorder. The patient does not recall the name of the problem. The patient has no full siblings. She may have a half-brother. There is no history of breast or ovarian cancer in the family.  GYNECOLOGIC HISTORY: Menarche age 74, first live birth age 89, the patient is [place] P2. She had her hysterectomy at age 82 and took hormone replacement until approximately 1973.  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 She is a widow, her husband died of from acute myeloid leukemia in 2012  (he only lived 6 weeks from the time of diagnosis). Son Nicki Reaper lives in [place] and is a Civil engineer, contracting. Daughter [name] lives in [place] and is a Engineer, manufacturing systems. The patient has 1 grandchild. She attends a [employer].    ADVANCED DIRECTIVES: In place; the patient tells me [name] is her healthcare power of attorney. She can be reached at [phone]. The patient also wanted [place] to have her son's phone number. It is [phone].  HEALTH MAINTENANCE: Social History  Substance Use Topics  . Smoking status: Never Smoker  . Smokeless tobacco: Not on file  . Alcohol use No     Colonoscopy: 2013/[facility]   PAP: s/p hysterectomy  Bone density: 2013/ "normal"  Lipid panel:  Allergies  Allergen Reactions  . Codeine Other (See Comments)    Unknown reaction  . Thiazide-Type Diuretics Other (See Comments)    Blurry vision    Current Outpatient Prescriptions  Medication Sig Dispense Refill   . amLODipine (NORVASC) 5 MG tablet Take 3.5 mg by mouth 2 (two) times daily.     . Biotin 10000 MCG TABS Take 1,000 mg by mouth every morning. 90 tablet 4  . Cholecalciferol (D3-1000 PO) Take by mouth daily.    Marland Kitchen levothyroxine (SYNTHROID, LEVOTHROID) 50 MCG tablet Take 50 mcg by mouth daily.    . meclizine (ANTIVERT) 25 MG tablet Take 1 tablet (25 mg total) by mouth 3 (three) times daily as needed for dizziness. 30 tablet 0  . Misc Natural Products (GRAPE SEED COMPLEX PO) Take by mouth 2 (two) times daily.    . Multiple Vitamins-Minerals (PRESERVISION AREDS PO) Take by mouth daily.    . vitamin B-12 (CYANOCOBALAMIN) 100 MCG tablet Take 50 mcg by mouth daily.    Marland Kitchen zolpidem (AMBIEN) 10 MG tablet Take 10 mg by mouth. Pt takes 1/4 of a tablet     No current facility-administered medications for this visit.     OBJECTIVE: Middle-aged white womanIn no  acute distress Vitals:   02/01/16 1127  BP: 133/71  Pulse: 73  Resp: 18  Temp: 97.6 F (36.4 C)     Body mass index is 20.67 kg/m.    ECOG FS: 0  Sclerae unicteric, pupils round and equal Oropharynx clear and moist-- no thrush or other lesions No cervical or supraclavicular adenopathy Lungs no rales or rhonchi Heart regular rate and rhythm Abd soft, nontender, positive bowel sounds MSK no focal spinal tenderness, no upper extremity lymphedema Neuro: nonfocal, well oriented, appropriate affect Breasts: The right breast is status post lumpectomy and radiation. There is no evidence of local recurrence. The right axilla is benign. The left breast is unremarkable  LAB RESULTS: Lab Results  Component Value Date   WBC 4.6 02/01/2016   NEUTROABS 3.0 02/01/2016   HGB 15.0 02/01/2016   HCT 45.0 02/01/2016   MCV 90.0 02/01/2016   PLT 187 02/01/2016      Chemistry      Component Value Date/Time   NA 144 02/01/2016 1101   K 4.4 02/01/2016 1101   CL 106 04/25/2012 1606   CO2 26 02/01/2016 1101   BUN 15.3 02/01/2016 1101   CREATININE  0.8 02/01/2016 1101      Component Value Date/Time   CALCIUM 10.2 02/01/2016 1101   ALKPHOS 82 02/01/2016 1101   AST 15 02/01/2016 1101   ALT 13 02/01/2016 1101   BILITOT 0.69 02/01/2016 1101       No results found for: LABCA2  No components found for: LABCA125  No results for input(s): INR in the last 168 hours.  Urinalysis    Component Value Date/Time   COLORURINE YELLOW 04/09/2012 1202   APPEARANCEUR CLEAR 04/09/2012 1202   LABSPEC 1.011 04/09/2012 1202   PHURINE 7.0 04/09/2012 1202   GLUCOSEU NEGATIVE 04/09/2012 1202   HGBUR NEGATIVE 04/09/2012 1202   BILIRUBINUR NEGATIVE 04/09/2012 1202   KETONESUR NEGATIVE 04/09/2012 1202   PROTEINUR NEGATIVE 04/09/2012 1202   UROBILINOGEN 0.2 04/09/2012 1202   NITRITE NEGATIVE 04/09/2012 1202   LEUKOCYTESUR TRACE (A) 04/09/2012 1202    STUDIES: No results found.   ASSESSMENT: 82 y.o. [place] woman status post right lumpectomy 04/09/2012 for a ductal carcinoma in situ, grade 2, estrogen and progesterone receptor positive, both at 100%, with negative margins.  (1) completed adjuvant radiation 02//20/2014  (2) decided against adjuvant antiestrogen originally, later agreed to anastrozole, started April 2015. Discontinued by patient in December 2016. Restarted April 2017  (3) osteopenia   PLAN:  Pamlea Is now just about 4 years out from definitive surgery for her breast cancer with no evidence of disease recurrence. This is very favorable.  She has made a valiant effort on anastrozole but is losing significant amounts of hair. This may indeed be related to that medication. At this point we are stopping the anastrozole, at least temporarily, and I am starting her on biotin. I'm hopeful that by the time I see her in 6 months and will have been some regrowth of her hair and we can consider what we want to do for the remaining. Of possible treatment.  We discussed her vertigo, which is mild but a real concern to her because she is  afraid of falling. I prescribed meclizine but she tells me this has not been helpful in the past. She is going to be contacting Dr. Felipa Eth to see if further rehabilitation maneuvers are helpful as she has found this to be helpful in the past.  Otherwise she will return to  see me in 6 months. She knows to call for any problems that may develop before that visit.  Juquan Reznick C    02/01/2016

## 2016-02-01 NOTE — Telephone Encounter (Signed)
appt made and avs printed °

## 2016-02-28 DIAGNOSIS — Z23 Encounter for immunization: Secondary | ICD-10-CM | POA: Diagnosis not present

## 2016-03-09 DIAGNOSIS — Z1239 Encounter for other screening for malignant neoplasm of breast: Secondary | ICD-10-CM | POA: Diagnosis not present

## 2016-03-09 DIAGNOSIS — Z853 Personal history of malignant neoplasm of breast: Secondary | ICD-10-CM | POA: Diagnosis not present

## 2016-03-30 ENCOUNTER — Emergency Department (HOSPITAL_COMMUNITY): Payer: Medicare Other

## 2016-03-30 ENCOUNTER — Encounter (HOSPITAL_COMMUNITY): Payer: Self-pay | Admitting: *Deleted

## 2016-03-30 ENCOUNTER — Emergency Department (HOSPITAL_COMMUNITY)
Admission: EM | Admit: 2016-03-30 | Discharge: 2016-03-30 | Disposition: A | Discharge status: Home / Self Care | Payer: Medicare Other | Attending: Emergency Medicine | Admitting: Emergency Medicine

## 2016-03-30 DIAGNOSIS — S79911A Unspecified injury of right hip, initial encounter: Secondary | ICD-10-CM | POA: Diagnosis not present

## 2016-03-30 DIAGNOSIS — Y999 Unspecified external cause status: Secondary | ICD-10-CM | POA: Diagnosis not present

## 2016-03-30 DIAGNOSIS — E039 Hypothyroidism, unspecified: Secondary | ICD-10-CM | POA: Diagnosis not present

## 2016-03-30 DIAGNOSIS — Y939 Activity, unspecified: Secondary | ICD-10-CM | POA: Diagnosis not present

## 2016-03-30 DIAGNOSIS — S63501A Unspecified sprain of right wrist, initial encounter: Secondary | ICD-10-CM | POA: Diagnosis not present

## 2016-03-30 DIAGNOSIS — T148XXA Other injury of unspecified body region, initial encounter: Secondary | ICD-10-CM | POA: Diagnosis not present

## 2016-03-30 DIAGNOSIS — Z79899 Other long term (current) drug therapy: Secondary | ICD-10-CM | POA: Diagnosis not present

## 2016-03-30 DIAGNOSIS — W010XXA Fall on same level from slipping, tripping and stumbling without subsequent striking against object, initial encounter: Secondary | ICD-10-CM | POA: Insufficient documentation

## 2016-03-30 DIAGNOSIS — S7001XA Contusion of right hip, initial encounter: Secondary | ICD-10-CM | POA: Diagnosis not present

## 2016-03-30 DIAGNOSIS — Y929 Unspecified place or not applicable: Secondary | ICD-10-CM | POA: Insufficient documentation

## 2016-03-30 DIAGNOSIS — M25551 Pain in right hip: Secondary | ICD-10-CM | POA: Diagnosis not present

## 2016-03-30 DIAGNOSIS — I10 Essential (primary) hypertension: Secondary | ICD-10-CM | POA: Insufficient documentation

## 2016-03-30 DIAGNOSIS — M25531 Pain in right wrist: Secondary | ICD-10-CM | POA: Diagnosis not present

## 2016-03-30 DIAGNOSIS — Z853 Personal history of malignant neoplasm of breast: Secondary | ICD-10-CM | POA: Diagnosis not present

## 2016-03-30 DIAGNOSIS — S6991XA Unspecified injury of right wrist, hand and finger(s), initial encounter: Secondary | ICD-10-CM | POA: Diagnosis not present

## 2016-03-30 NOTE — Discharge Instructions (Signed)
Take over-the-counter pain medications as needed. Apply ice to help with the swelling. Use the splint on her wrist for comfort. Follow-up with your orthopedic doctor to check on your hip and wrist injury

## 2016-03-30 NOTE — ED Triage Notes (Addendum)
Per EMS, pt tripped over her cat and fell onto her right hip. Pt complains of pain and has swelling to right hip. Pt also complains of pain in right wrist and has abrasion to right elbow. Pt denies loss of consciousness or head injury.

## 2016-03-30 NOTE — ED Provider Notes (Signed)
[facility] DEPT Provider Note   CSN: [id number] Arrival date & time: 03/30/16  1616     History   Chief Complaint Chief Complaint  Patient presents with  . Fall    HPI Sarah [name] is a 82 y.o. female.  HPI Pt tripped when a cat ran between her feet oday.  She landed on the right wrist and hip.  She is now having pain in her right hip and right wrist.  The hip is worse than the hip.  She thinks she may have jarred her neck as well.  Pt has not tried to walk since the fall.  No head injury.  No LOC.  Past Medical History:  Diagnosis Date  . Allergy    codeine, thiazides  . Anxiety    new dx  . Arthritis   . Breast cancer ([place]) 03/14/12   bx=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,ER/PR=+,upper inner quad  . Cancer ([place])    breast  . Cataract   . Glaucoma    laser treated years ago  . HOH (hard of hearing)   . Hyperlipemia   . Hypertension   . Hypothyroidism   . Pancreatic cyst    benign  . PONV (postoperative nausea and vomiting)   . Radiation 06/11/2012-07/12/2012   17 sessions 4250 cGy, 3 sessions 750 cGy  . Vertigo   . Wears glasses   . Wears partial dentures    partial upper    Patient Active Problem List   Diagnosis Date Noted  . Osteopenia 12/18/2013  . Heart block AV first degree 12/18/2013  . Breast cancer of upper-inner quadrant of right female breast ([place]) 03/19/2012  . Right knee DJD 08/18/2011  . Hypothyroidism   . Hypertension   . PONV (postoperative nausea and vomiting)   . Hyperlipemia     Past Surgical History:  Procedure Laterality Date  . ABDOMINAL HYSTERECTOMY  1966   1/2 ovary left in   . BREAST SURGERY  1992   lumpectomy-lt  . CATARACT EXTRACTION     b/l  . COLONOSCOPY    . EYE SURGERY     bilateral cataract removal  . JOINT REPLACEMENT  2013   rt total knee  . JOINT REPLACEMENT  1995   lt total knee  . PARTIAL MASTECTOMY WITH NEEDLE LOCALIZATION  04/09/2012   Procedure: PARTIAL MASTECTOMY WITH  NEEDLE LOCALIZATION;  Surgeon: Adin Hector, MD;  Location: [facility];  Service: General;  Laterality: Right;  . TONSILLECTOMY    . TOTAL KNEE ARTHROPLASTY  08/16/2011   Procedure: TOTAL KNEE ARTHROPLASTY;  Surgeon: Ninetta Lights, MD;  Location: [facility];  Service: Orthopedics;  Laterality: Right;    OB History    No data available       Home Medications    Prior to Admission medications   Medication Sig Start Date End Date Taking? Authorizing Provider  amLODipine (NORVASC) 5 MG tablet Take 3.5 mg by mouth 2 (two) times daily.     Historical Provider, MD  Biotin 10000 MCG TABS Take 1,000 mg by mouth every morning. 02/01/16   Chauncey Cruel, MD  Cholecalciferol (D3-1000 PO) Take by mouth daily.    Historical Provider, MD  levothyroxine (SYNTHROID, LEVOTHROID) 50 MCG tablet Take 50 mcg by mouth daily.    Historical Provider, MD  meclizine (ANTIVERT) 25 MG tablet Take 1 tablet (25 mg total) by mouth 3 (three) times daily as needed for dizziness. 02/01/16   Chauncey Cruel, MD  Misc Natural Products (GRAPE SEED COMPLEX PO) Take by mouth 2 (two) times daily.    Historical Provider, MD  Multiple Vitamins-Minerals (PRESERVISION AREDS PO) Take by mouth daily.    Historical Provider, MD  vitamin B-12 (CYANOCOBALAMIN) 100 MCG tablet Take 50 mcg by mouth daily.    Historical Provider, MD  zolpidem (AMBIEN) 10 MG tablet Take 10 mg by mouth. Pt takes 1/4 of a tablet 07/24/14   Historical Provider, MD    Family History Family History  Problem Relation Age of Onset  . Heart disease Father   . Cancer Maternal Aunt     stomach    Social History Social History  Substance Use Topics  . Smoking status: Never Smoker  . Smokeless tobacco: Never Used  . Alcohol use No     Allergies   Codeine and Thiazide-type diuretics   Review of Systems Review of Systems  All other systems reviewed and are negative.    Physical Exam Updated Vital Signs BP 133/84 (BP Location:  Right Arm)   Pulse 72   Temp 97.9 F (36.6 C) (Oral)   Resp 18   SpO2 96%   Physical Exam  Constitutional: She appears well-developed and well-nourished. No distress.  HENT:  Head: Normocephalic and atraumatic.  Right Ear: External ear normal.  Left Ear: External ear normal.  Eyes: Conjunctivae are normal. Right eye exhibits no discharge. Left eye exhibits no discharge. No scleral icterus.  Neck: Neck supple. No tracheal deviation present.  Cardiovascular: Normal rate, regular rhythm and intact distal pulses.   Pulmonary/Chest: Effort normal and breath sounds normal. No stridor. No respiratory distress. She has no wheezes. She has no rales.  Abdominal: Soft. Bowel sounds are normal. She exhibits no distension. There is no tenderness. There is no rebound and no guarding.  Musculoskeletal: She exhibits no edema.       Right shoulder: She exhibits no tenderness, no bony tenderness and no swelling.       Left shoulder: She exhibits no tenderness, no bony tenderness and no swelling.       Right wrist: She exhibits swelling (mild swelling noted along the styloid process, no point tenderness). She exhibits no tenderness and no bony tenderness.       Left wrist: She exhibits no tenderness, no bony tenderness and no swelling.       Right hip: She exhibits tenderness and swelling. She exhibits normal range of motion, no bony tenderness and no deformity.       Left hip: She exhibits normal range of motion, no tenderness and no bony tenderness.       Right ankle: She exhibits no swelling. No tenderness.       Left ankle: She exhibits no swelling. No tenderness.       Cervical back: She exhibits no tenderness, no bony tenderness and no swelling.       Thoracic back: She exhibits no tenderness, no bony tenderness and no swelling.       Lumbar back: She exhibits no tenderness, no bony tenderness and no swelling.  Neurological: She is alert. She has normal strength. No cranial nerve deficit (no facial  droop, extraocular movements intact, no slurred speech) or sensory deficit. She exhibits normal muscle tone. She displays no seizure activity. Coordination normal.  Skin: Skin is warm and dry. No rash noted.  Psychiatric: She has a normal mood and affect.  Nursing note and vitals reviewed.    ED Treatments / Results    Radiology Dg  Wrist Complete Right  Result Date: 03/30/2016 CLINICAL DATA:  Pain following fall EXAM: RIGHT WRIST - COMPLETE 3+ VIEW COMPARISON:  None. FINDINGS: Frontal, oblique, lateral, and ulnar deviation scaphoid images were obtained. There is no acute fracture or dislocation. There is separation between the scaphoid and lunate consistent with scapholunate disassociation. There is been marked osteoarthritic change in the scaphotrapezial joint with remodeling of the scaphoid and trapezium bones. Other joint spaces appear unremarkable. No erosive change. Bones appear somewhat osteoporotic. IMPRESSION: Bones appear somewhat osteoporotic. No evidence acute fracture or dislocation. Evidence of scapholunate disassociation. Marked osteoarthritic change in the scaphotrapezial joint with remodeling of the scaphoid and trapezium bones due to this advanced arthropathy. Electronically Signed   By: Lowella Grip III M.D.   On: 03/30/2016 17:18   Dg Hip Unilat  With Pelvis 2-3 Views Right  Result Date: 03/30/2016 CLINICAL DATA:  Tripped and fell today.  Right hip pain. EXAM: DG HIP (WITH OR WITHOUT PELVIS) 2-3V RIGHT COMPARISON:  None. FINDINGS: Moderate asymmetric right hip joint degenerative changes and findings suspicious for AVN. No acute fractures identified. The left hip is normally located. The pubic symphysis and SI joints are intact. No pelvic fracture or bone lesion. IMPRESSION: Right hip AVN suspected. MRI would be helpful for further evaluation clinically indicated. Moderate asymmetric right hip joint degenerative changes. No acute hip or pelvic fractures are identified.  Electronically Signed   By: Marijo Sanes M.D.   On: 03/30/2016 17:17    Procedures Procedures (including critical care time)  Medications Ordered in ED Medications - No data to display   Initial Impression / Assessment and Plan / ED Course  I have reviewed the triage vital signs and the nursing notes.  Pertinent labs & imaging results that were available during my care of the patient were reviewed by me and considered in my medical decision making (see chart for details).  Clinical Course    X-ray of the wrist does not show any acute fracture. There is suggestion of scapholunate dissociation. Patient does not have any tenderness in that area.  Hip x-ray does not show any acute fracture. AVN is suspected. Patient states she does have chronic hip pain and sees an orthopedist for this. Patient is able to stand and walk. I doubt acute fracture. We discussed the option of doing an MRI in the emergency room but the patient feels comfortable following up with her orthopedist as an outpatient.  Final Clinical Impressions(s) / ED Diagnoses   Final diagnoses:  Sprain of right wrist, initial encounter  Contusion of right hip, initial encounter    New Prescriptions New Prescriptions   No medications on file     Dorie Rank, MD 03/30/16 1904

## 2016-03-30 NOTE — ED Notes (Signed)
Pt ambulated 129f

## 2016-03-30 NOTE — ED Notes (Signed)
Bed: [facility] Expected date:  Expected time:  Means of arrival:  Comments: 

## 2016-03-30 NOTE — Progress Notes (Signed)
EDCM spoke to patient at bedside.  Patient politely refused a walker and home health services.  Reports she has a walker at home.  No further EDCM needs at this time.

## 2016-04-04 DIAGNOSIS — M7061 Trochanteric bursitis, right hip: Secondary | ICD-10-CM | POA: Diagnosis not present

## 2016-04-17 DIAGNOSIS — R42 Dizziness and giddiness: Secondary | ICD-10-CM | POA: Diagnosis not present

## 2016-04-17 DIAGNOSIS — I10 Essential (primary) hypertension: Secondary | ICD-10-CM | POA: Diagnosis not present

## 2016-04-17 DIAGNOSIS — E039 Hypothyroidism, unspecified: Secondary | ICD-10-CM | POA: Diagnosis not present

## 2016-04-17 DIAGNOSIS — L659 Nonscarring hair loss, unspecified: Secondary | ICD-10-CM | POA: Diagnosis not present

## 2016-05-10 DIAGNOSIS — H18452 Nodular corneal degeneration, left eye: Secondary | ICD-10-CM | POA: Diagnosis not present

## 2016-05-10 DIAGNOSIS — H04123 Dry eye syndrome of bilateral lacrimal glands: Secondary | ICD-10-CM | POA: Diagnosis not present

## 2016-05-10 DIAGNOSIS — H1859 Other hereditary corneal dystrophies: Secondary | ICD-10-CM | POA: Diagnosis not present

## 2016-05-19 DIAGNOSIS — H16223 Keratoconjunctivitis sicca, not specified as Sjogren's, bilateral: Secondary | ICD-10-CM | POA: Diagnosis not present

## 2016-05-19 DIAGNOSIS — Z961 Presence of intraocular lens: Secondary | ICD-10-CM | POA: Diagnosis not present

## 2016-05-19 DIAGNOSIS — H18452 Nodular corneal degeneration, left eye: Secondary | ICD-10-CM | POA: Diagnosis not present

## 2016-05-19 DIAGNOSIS — H1859 Other hereditary corneal dystrophies: Secondary | ICD-10-CM | POA: Diagnosis not present

## 2016-05-19 DIAGNOSIS — H1851 Endothelial corneal dystrophy: Secondary | ICD-10-CM | POA: Diagnosis not present

## 2016-06-09 DIAGNOSIS — H16223 Keratoconjunctivitis sicca, not specified as Sjogren's, bilateral: Secondary | ICD-10-CM | POA: Diagnosis not present

## 2016-06-09 DIAGNOSIS — H1851 Endothelial corneal dystrophy: Secondary | ICD-10-CM | POA: Diagnosis not present

## 2016-07-24 ENCOUNTER — Other Ambulatory Visit: Payer: Self-pay | Admitting: Adult Health

## 2016-07-24 DIAGNOSIS — Z170 Estrogen receptor positive status [ER+]: Principal | ICD-10-CM

## 2016-07-24 DIAGNOSIS — C50211 Malignant neoplasm of upper-inner quadrant of right female breast: Secondary | ICD-10-CM

## 2016-07-25 ENCOUNTER — Other Ambulatory Visit (HOSPITAL_BASED_OUTPATIENT_CLINIC_OR_DEPARTMENT_OTHER): Payer: Medicare Other

## 2016-07-25 DIAGNOSIS — M8580 Other specified disorders of bone density and structure, unspecified site: Secondary | ICD-10-CM | POA: Diagnosis not present

## 2016-07-25 DIAGNOSIS — Z86000 Personal history of in-situ neoplasm of breast: Secondary | ICD-10-CM | POA: Diagnosis present

## 2016-07-25 DIAGNOSIS — C50211 Malignant neoplasm of upper-inner quadrant of right female breast: Secondary | ICD-10-CM

## 2016-07-25 DIAGNOSIS — Z170 Estrogen receptor positive status [ER+]: Principal | ICD-10-CM

## 2016-07-25 LAB — CBC WITH DIFFERENTIAL/PLATELET
BASO%: 1.2 % (ref 0.0–2.0)
Basophils Absolute: 0.1 10*3/uL (ref 0.0–0.1)
EOS%: 1.3 % (ref 0.0–7.0)
Eosinophils Absolute: 0.1 10*3/uL (ref 0.0–0.5)
HCT: 44.9 % (ref 34.8–46.6)
HGB: 15.2 g/dL (ref 11.6–15.9)
LYMPH%: 20.6 % (ref 14.0–49.7)
MCH: 29.9 pg (ref 25.1–34.0)
MCHC: 33.7 g/dL (ref 31.5–36.0)
MCV: 88.5 fL (ref 79.5–101.0)
MONO#: 0.4 10*3/uL (ref 0.1–0.9)
MONO%: 8 % (ref 0.0–14.0)
NEUT#: 3 10*3/uL (ref 1.5–6.5)
NEUT%: 68.9 % (ref 38.4–76.8)
Platelets: 176 10*3/uL (ref 145–400)
RBC: 5.08 10*6/uL (ref 3.70–5.45)
RDW: 13.7 % (ref 11.2–14.5)
WBC: 4.4 10*3/uL (ref 3.9–10.3)
lymph#: 0.9 10*3/uL (ref 0.9–3.3)

## 2016-07-25 LAB — COMPREHENSIVE METABOLIC PANEL
ALBUMIN: 4.3 g/dL (ref 3.5–5.0)
ALK PHOS: 99 U/L (ref 40–150)
ALT: 11 U/L (ref 0–55)
AST: 17 U/L (ref 5–34)
Anion Gap: 11 mEq/L (ref 3–11)
BUN: 15.5 mg/dL (ref 7.0–26.0)
CO2: 27 mEq/L (ref 22–29)
Calcium: 10.1 mg/dL (ref 8.4–10.4)
Chloride: 104 mEq/L (ref 98–109)
Creatinine: 0.8 mg/dL (ref 0.6–1.1)
EGFR: 68 mL/min/{1.73_m2} — AB (ref >90)
GLUCOSE: 102 mg/dL (ref 70–140)
POTASSIUM: 3.6 meq/L (ref 3.5–5.1)
SODIUM: 141 meq/L (ref 136–145)
Total Bilirubin: 0.61 mg/dL (ref 0.20–1.20)
Total Protein: 7.5 g/dL (ref 6.4–8.3)

## 2016-08-01 ENCOUNTER — Telehealth: Payer: Self-pay | Admitting: Oncology

## 2016-08-01 ENCOUNTER — Telehealth: Payer: Self-pay | Admitting: *Deleted

## 2016-08-01 ENCOUNTER — Ambulatory Visit: Payer: Medicare Other | Admitting: Oncology

## 2016-08-01 ENCOUNTER — Ambulatory Visit (HOSPITAL_BASED_OUTPATIENT_CLINIC_OR_DEPARTMENT_OTHER): Payer: Medicare Other | Admitting: Oncology

## 2016-08-01 VITALS — BP 145/79 | HR 72 | Temp 97.6°F | Resp 18 | Ht 64.0 in | Wt 125.4 lb

## 2016-08-01 DIAGNOSIS — C50211 Malignant neoplasm of upper-inner quadrant of right female breast: Secondary | ICD-10-CM

## 2016-08-01 DIAGNOSIS — M8580 Other specified disorders of bone density and structure, unspecified site: Secondary | ICD-10-CM

## 2016-08-01 DIAGNOSIS — Z853 Personal history of malignant neoplasm of breast: Secondary | ICD-10-CM

## 2016-08-01 DIAGNOSIS — Z170 Estrogen receptor positive status [ER+]: Principal | ICD-10-CM

## 2016-08-01 NOTE — Progress Notes (Signed)
ID: Sarah [name]   DOB: 1932-07-11  MR#: [id number]  VQM#:[id number]  PCP: Sarah Argyle, MD GYN:  Sarah [name] OTHER MD: Sarah [name], Sarah [name]  CHIEF COMPLAINT: Noninvasive breast cancer  CURRENT TREATMENT: Observation  BREAST CANCER HISTORY: From the original intake note:  The patient had bilateral diagnostic mammography at [facility] 07/11/2011. This showed some calcifications in the right breast, which seemed a little bit more prominent than prior. In the left breast there was an area of possible architectural distortion. However left breast ultrasound the same day showed no abnormality. 6 month followup was suggested, and on 02/28/2012 the patient again had bilateral diagnostic mammography, now with right ultrasonography. The microcalcifications in the right breast appeared increased. Ultrasound showed a hypoechoic lesion measuring 5 mm, with no associated shadowing. This had been previously noted and appeared unchanged. Anterior to that there was a small hypoechoic mass measuring 4 mm in diameter. There was felt to be suspicious, and on 03/14/2012 the patient underwent biopsy of the right breast mass, showing ([id number]) ductal carcinoma in situ, intermediate grade, estrogen receptor 100% and progesterone receptor 100% positive.  Bilateral breast MRI was obtained 03/26/2012. This showed only post biopsy changes in the right breast, associated with an area of non-masslike enhancement measuring 2.4 cm. The left breast was unremarkable, and there was no enlarged axillary or internal mammary adenopathy noted. Accordingly on 04/09/2012 the patient underwent right lumpectomy, the pathology ([id number]) showing ductal carcinoma in situ measuring 2.0 cm, grade 2, with negative margins, the closest being 0.3 cm. The patient's subsequent history is as detailed below.  INTERVAL HISTORY: Sarah [name] returns today for follow-up of her ductal carcinoma in situ. She has been off treatment and  observation alone. She finds that her hair is not coming out "as bad as before". She is taking some biotin which she thinks it may have helped "a little".  Recently she fell (her Trip to her up" and hit her right side, with no fractures but significant bruising. She was seen in the emergency department and by Sarah [name]. She is recovered well from that.  REVIEW OF SYSTEMS: A detailed review of systems today was stable except as noted above.  PAST MEDICAL HISTORY: Past Medical History:  Diagnosis Date  . Allergy    codeine, thiazides  . Anxiety    new dx  . Arthritis   . Breast cancer ([place]) 03/14/12   bx=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,ER/PR=+,upper inner quad  . Cancer ([place])    breast  . Cataract   . Glaucoma    laser treated years ago  . HOH (hard of hearing)   . Hyperlipemia   . Hypertension   . Hypothyroidism   . Pancreatic cyst    benign  . PONV (postoperative nausea and vomiting)   . Radiation 06/11/2012-07/12/2012   17 sessions 4250 cGy, 3 sessions 750 cGy  . Vertigo   . Wears glasses   . Wears partial dentures    partial upper    PAST SURGICAL HISTORY: Past Surgical History:  Procedure Laterality Date  . ABDOMINAL HYSTERECTOMY  1966   1/2 ovary left in   . BREAST SURGERY  1992   lumpectomy-lt  . CATARACT EXTRACTION     b/l  . COLONOSCOPY    . EYE SURGERY     bilateral cataract removal  . JOINT REPLACEMENT  2013   rt total knee  . JOINT REPLACEMENT  1995   lt total knee  . PARTIAL MASTECTOMY WITH  NEEDLE LOCALIZATION  04/09/2012   Procedure: PARTIAL MASTECTOMY WITH NEEDLE LOCALIZATION;  Surgeon: Sarah Hector, MD;  Location: [place];  Service: General;  Laterality: Right;  . TONSILLECTOMY    . TOTAL KNEE ARTHROPLASTY  08/16/2011   Procedure: TOTAL KNEE ARTHROPLASTY;  Surgeon: Sarah Lights, MD;  Location: [place];  Service: Orthopedics;  Laterality: Right;    FAMILY HISTORY Family History  Problem Relation  Age of Onset  . Heart disease Father   . Cancer Maternal Aunt     stomach   the patient never met her father, does not know his cause of death or age at death, but thinks it may have been of likely a heart attack based on comments in the obituary. The patient's mother died at the age of 86 from a rare blood disorder. The patient does not recall the name of the problem. The patient has no full siblings. She may have a half-brother. There is no history of breast or ovarian cancer in the family.  GYNECOLOGIC HISTORY: Menarche age 60, first live birth age 29, the patient is [place] P2. She had her hysterectomy at age 29 and took hormone replacement until approximately 1973.  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 She is a widow, her husband died of from acute myeloid leukemia in 2012  (he only lived 6 weeks from the time of diagnosis). Son Sarah [name] lives in [place] and is a Civil engineer, contracting. Daughter Sarah [name] lives in [place] and is a Engineer, manufacturing systems. The patient has 1 grandchild. She attends a [employer].    ADVANCED DIRECTIVES: In place; the patient tells me her daughter Sarah [name] is her healthcare power of attorney. She can be reached at [phone]. The patient also wanted [place] to have her son's phone number. It is [phone].  HEALTH MAINTENANCE: Social History  Substance Use Topics  . Smoking status: Never Smoker  . Smokeless tobacco: Never Used  . Alcohol use No     Colonoscopy: 2013/Sarah [name]   PAP: s/p hysterectomy  Bone density: 2013/ "normal"  Lipid panel:  Allergies  Allergen Reactions  . Codeine Other (See Comments)    Unknown reaction  . Thiazide-Type Diuretics Other (See Comments)    Blurry vision    Current Outpatient Prescriptions  Medication Sig Dispense Refill  . amLODipine (NORVASC) 5 MG tablet Take 3.5 mg by mouth 2 (two) times daily.     . Biotin 10000 MCG TABS Take 1,000 mg by mouth every morning. 90 tablet 4  . Cholecalciferol (D3-1000 PO) Take by  mouth daily.    Marland Kitchen levothyroxine (SYNTHROID, LEVOTHROID) 50 MCG tablet Take 50 mcg by mouth daily.    . meclizine (ANTIVERT) 25 MG tablet Take 1 tablet (25 mg total) by mouth 3 (three) times daily as needed for dizziness. 30 tablet 0  . Misc Natural Products (GRAPE SEED COMPLEX PO) Take by mouth 2 (two) times daily.    . Multiple Vitamins-Minerals (PRESERVISION AREDS PO) Take by mouth daily.    . vitamin B-12 (CYANOCOBALAMIN) 100 MCG tablet Take 50 mcg by mouth daily.    Marland Kitchen zolpidem (AMBIEN) 10 MG tablet Take 10 mg by mouth. Pt takes 1/4 of a tablet     No current facility-administered medications for this visit.     OBJECTIVE: Middle-aged white woman who appears younger than stated age  83:   08/01/16 1209  BP: (!) 145/79  Pulse: 72  Resp: 18  Temp: 97.6 F (36.4 C)  Body mass index is 21.52 kg/m.    ECOG FS: 1  Sclerae unicteric, EOMs intact Oropharynx clear and moist No cervical or supraclavicular adenopathy Lungs no rales or rhonchi Heart regular rate and rhythm Abd soft, nontender, positive bowel sounds MSK no focal spinal tenderness, no upper extremity lymphedema Neuro: nonfocal, well oriented, appropriate affect Breasts: The right breast is undergone lumpectomy and radiation, with no evidence of local recurrence. The right axilla is benign. Left breast is unremarkable  LAB RESULTS: Lab Results  Component Value Date   WBC 4.4 07/25/2016   NEUTROABS 3.0 07/25/2016   HGB 15.2 07/25/2016   HCT 44.9 07/25/2016   MCV 88.5 07/25/2016   PLT 176 07/25/2016      Chemistry      Component Value Date/Time   NA 141 07/25/2016 1053   K 3.6 07/25/2016 1053   CL 106 04/25/2012 1606   CO2 27 07/25/2016 1053   BUN 15.5 07/25/2016 1053   CREATININE 0.8 07/25/2016 1053      Component Value Date/Time   CALCIUM 10.1 07/25/2016 1053   ALKPHOS 99 07/25/2016 1053   AST 17 07/25/2016 1053   ALT 11 07/25/2016 1053   BILITOT 0.61 07/25/2016 1053       No results found  for: LABCA2  No components found for: LABCA125  No results for input(s): INR in the last 168 hours.  Urinalysis    Component Value Date/Time   COLORURINE YELLOW 04/09/2012 1202   APPEARANCEUR CLEAR 04/09/2012 1202   LABSPEC 1.011 04/09/2012 1202   PHURINE 7.0 04/09/2012 1202   GLUCOSEU NEGATIVE 04/09/2012 1202   HGBUR NEGATIVE 04/09/2012 1202   BILIRUBINUR NEGATIVE 04/09/2012 1202   KETONESUR NEGATIVE 04/09/2012 1202   PROTEINUR NEGATIVE 04/09/2012 1202   UROBILINOGEN 0.2 04/09/2012 1202   NITRITE NEGATIVE 04/09/2012 1202   LEUKOCYTESUR TRACE (A) 04/09/2012 1202    STUDIES: No results found.   ASSESSMENT: 83 y.o. [place] woman status post right lumpectomy 04/09/2012 for a ductal carcinoma in situ, grade 2, estrogen and progesterone receptor positive, both at 100%, with negative margins.  (1) completed adjuvant radiation 02//20/2014  (2) decided against adjuvant antiestrogen originally, later agreed to anastrozole, started April 2015. Discontinued by patient in December 2016. Restarted April 2017, discontinued October 2017  (3) osteopenia   PLAN:  Jaimarie Is now a little over 4 years out from definitive surgery for her noninvasive breast cancer with no evidence of disease recurrence. This is very favorable.  She is comfortable "graduating" from follow-up today. She has excellent follow-up through her primary care physician and we are not doing any active treatment so I am entirely comfortable with that.  All she will need is her yearly mammogram, which she usually has an October (and is already scheduled) and a yearly physician breast exam  I will be glad to see Ashliegh again at any point in the future if on when the need arises, but as of now are making no further routine appointment for her here Chauncey Cruel    08/01/2016

## 2016-08-01 NOTE — Telephone Encounter (Signed)
Cancelled her appointment and needs to be rescheduled

## 2016-08-01 NOTE — Telephone Encounter (Signed)
This RN was contacted by registration stating pt was here for scheduled appointment at noon which was canceled per telephone encounter.  This RN scheduled patient per previous appointment.

## 2016-09-14 DIAGNOSIS — H18453 Nodular corneal degeneration, bilateral: Secondary | ICD-10-CM | POA: Diagnosis not present

## 2016-09-14 DIAGNOSIS — H353131 Nonexudative age-related macular degeneration, bilateral, early dry stage: Secondary | ICD-10-CM | POA: Diagnosis not present

## 2016-09-14 DIAGNOSIS — H40013 Open angle with borderline findings, low risk, bilateral: Secondary | ICD-10-CM | POA: Diagnosis not present

## 2016-09-14 DIAGNOSIS — H1859 Other hereditary corneal dystrophies: Secondary | ICD-10-CM | POA: Diagnosis not present

## 2016-09-14 DIAGNOSIS — H16223 Keratoconjunctivitis sicca, not specified as Sjogren's, bilateral: Secondary | ICD-10-CM | POA: Diagnosis not present

## 2016-09-14 DIAGNOSIS — H1851 Endothelial corneal dystrophy: Secondary | ICD-10-CM | POA: Diagnosis not present

## 2016-09-22 ENCOUNTER — Ambulatory Visit (INDEPENDENT_AMBULATORY_CARE_PROVIDER_SITE_OTHER): Payer: Medicare Other | Admitting: Ophthalmology

## 2016-09-27 ENCOUNTER — Ambulatory Visit (INDEPENDENT_AMBULATORY_CARE_PROVIDER_SITE_OTHER): Payer: Medicare Other | Admitting: Ophthalmology

## 2016-09-27 DIAGNOSIS — H43813 Vitreous degeneration, bilateral: Secondary | ICD-10-CM | POA: Diagnosis not present

## 2016-09-27 DIAGNOSIS — H35033 Hypertensive retinopathy, bilateral: Secondary | ICD-10-CM

## 2016-09-27 DIAGNOSIS — H353132 Nonexudative age-related macular degeneration, bilateral, intermediate dry stage: Secondary | ICD-10-CM | POA: Diagnosis not present

## 2016-09-27 DIAGNOSIS — I10 Essential (primary) hypertension: Secondary | ICD-10-CM | POA: Diagnosis not present

## 2016-09-30 DIAGNOSIS — L02511 Cutaneous abscess of right hand: Secondary | ICD-10-CM | POA: Diagnosis not present

## 2016-10-06 ENCOUNTER — Ambulatory Visit (INDEPENDENT_AMBULATORY_CARE_PROVIDER_SITE_OTHER): Payer: Medicare Other | Admitting: Ophthalmology

## 2016-10-09 DIAGNOSIS — I10 Essential (primary) hypertension: Secondary | ICD-10-CM | POA: Diagnosis not present

## 2016-10-09 DIAGNOSIS — M85852 Other specified disorders of bone density and structure, left thigh: Secondary | ICD-10-CM | POA: Diagnosis not present

## 2016-10-09 DIAGNOSIS — I872 Venous insufficiency (chronic) (peripheral): Secondary | ICD-10-CM | POA: Diagnosis not present

## 2016-10-09 DIAGNOSIS — Z0000 Encounter for general adult medical examination without abnormal findings: Secondary | ICD-10-CM | POA: Diagnosis not present

## 2016-10-09 DIAGNOSIS — Z79899 Other long term (current) drug therapy: Secondary | ICD-10-CM | POA: Diagnosis not present

## 2016-10-09 DIAGNOSIS — E039 Hypothyroidism, unspecified: Secondary | ICD-10-CM | POA: Diagnosis not present

## 2016-10-09 DIAGNOSIS — E7800 Pure hypercholesterolemia, unspecified: Secondary | ICD-10-CM | POA: Diagnosis not present

## 2016-10-09 DIAGNOSIS — Z1389 Encounter for screening for other disorder: Secondary | ICD-10-CM | POA: Diagnosis not present

## 2016-10-10 DIAGNOSIS — I10 Essential (primary) hypertension: Secondary | ICD-10-CM | POA: Diagnosis not present

## 2016-10-18 DIAGNOSIS — H938X3 Other specified disorders of ear, bilateral: Secondary | ICD-10-CM | POA: Diagnosis not present

## 2016-10-26 DIAGNOSIS — L659 Nonscarring hair loss, unspecified: Secondary | ICD-10-CM | POA: Diagnosis not present

## 2016-10-26 DIAGNOSIS — E039 Hypothyroidism, unspecified: Secondary | ICD-10-CM | POA: Diagnosis not present

## 2016-11-24 DIAGNOSIS — E039 Hypothyroidism, unspecified: Secondary | ICD-10-CM | POA: Diagnosis not present

## 2016-11-27 DIAGNOSIS — M810 Age-related osteoporosis without current pathological fracture: Secondary | ICD-10-CM | POA: Diagnosis not present

## 2016-11-27 DIAGNOSIS — M8588 Other specified disorders of bone density and structure, other site: Secondary | ICD-10-CM | POA: Diagnosis not present

## 2016-12-15 DIAGNOSIS — I10 Essential (primary) hypertension: Secondary | ICD-10-CM | POA: Diagnosis not present

## 2016-12-15 DIAGNOSIS — J339 Nasal polyp, unspecified: Secondary | ICD-10-CM | POA: Diagnosis not present

## 2016-12-15 DIAGNOSIS — M810 Age-related osteoporosis without current pathological fracture: Secondary | ICD-10-CM | POA: Diagnosis not present

## 2016-12-21 DIAGNOSIS — D140 Benign neoplasm of middle ear, nasal cavity and accessory sinuses: Secondary | ICD-10-CM | POA: Diagnosis not present

## 2017-01-29 DIAGNOSIS — L659 Nonscarring hair loss, unspecified: Secondary | ICD-10-CM | POA: Diagnosis not present

## 2017-01-29 DIAGNOSIS — E039 Hypothyroidism, unspecified: Secondary | ICD-10-CM | POA: Diagnosis not present

## 2017-02-24 DIAGNOSIS — Z23 Encounter for immunization: Secondary | ICD-10-CM | POA: Diagnosis not present

## 2017-03-15 DIAGNOSIS — R928 Other abnormal and inconclusive findings on diagnostic imaging of breast: Secondary | ICD-10-CM | POA: Diagnosis not present

## 2017-03-15 DIAGNOSIS — Z853 Personal history of malignant neoplasm of breast: Secondary | ICD-10-CM | POA: Diagnosis not present

## 2017-04-05 DIAGNOSIS — G4700 Insomnia, unspecified: Secondary | ICD-10-CM | POA: Diagnosis not present

## 2017-04-05 DIAGNOSIS — I10 Essential (primary) hypertension: Secondary | ICD-10-CM | POA: Diagnosis not present

## 2017-05-28 DIAGNOSIS — E039 Hypothyroidism, unspecified: Secondary | ICD-10-CM | POA: Diagnosis not present

## 2017-05-28 DIAGNOSIS — L659 Nonscarring hair loss, unspecified: Secondary | ICD-10-CM | POA: Diagnosis not present

## 2017-05-31 DIAGNOSIS — L309 Dermatitis, unspecified: Secondary | ICD-10-CM | POA: Diagnosis not present

## 2017-06-25 DIAGNOSIS — I10 Essential (primary) hypertension: Secondary | ICD-10-CM | POA: Diagnosis not present

## 2017-06-25 DIAGNOSIS — E039 Hypothyroidism, unspecified: Secondary | ICD-10-CM | POA: Diagnosis not present

## 2017-09-24 DIAGNOSIS — L817 Pigmented purpuric dermatosis: Secondary | ICD-10-CM | POA: Diagnosis not present

## 2017-09-24 DIAGNOSIS — D225 Melanocytic nevi of trunk: Secondary | ICD-10-CM | POA: Diagnosis not present

## 2017-09-24 DIAGNOSIS — L821 Other seborrheic keratosis: Secondary | ICD-10-CM | POA: Diagnosis not present

## 2017-09-27 ENCOUNTER — Ambulatory Visit (INDEPENDENT_AMBULATORY_CARE_PROVIDER_SITE_OTHER): Payer: Medicare Other | Admitting: Ophthalmology

## 2017-09-27 DIAGNOSIS — H353132 Nonexudative age-related macular degeneration, bilateral, intermediate dry stage: Secondary | ICD-10-CM | POA: Diagnosis not present

## 2017-09-27 DIAGNOSIS — H43813 Vitreous degeneration, bilateral: Secondary | ICD-10-CM | POA: Diagnosis not present

## 2017-09-27 DIAGNOSIS — H35033 Hypertensive retinopathy, bilateral: Secondary | ICD-10-CM

## 2017-09-27 DIAGNOSIS — I10 Essential (primary) hypertension: Secondary | ICD-10-CM | POA: Diagnosis not present

## 2017-10-10 DIAGNOSIS — R202 Paresthesia of skin: Secondary | ICD-10-CM | POA: Diagnosis not present

## 2017-10-10 DIAGNOSIS — Z1389 Encounter for screening for other disorder: Secondary | ICD-10-CM | POA: Diagnosis not present

## 2017-10-10 DIAGNOSIS — Z0000 Encounter for general adult medical examination without abnormal findings: Secondary | ICD-10-CM | POA: Diagnosis not present

## 2017-10-10 DIAGNOSIS — R413 Other amnesia: Secondary | ICD-10-CM | POA: Diagnosis not present

## 2017-10-10 DIAGNOSIS — H900 Conductive hearing loss, bilateral: Secondary | ICD-10-CM | POA: Diagnosis not present

## 2017-10-10 DIAGNOSIS — F5101 Primary insomnia: Secondary | ICD-10-CM | POA: Diagnosis not present

## 2017-10-10 DIAGNOSIS — Z79899 Other long term (current) drug therapy: Secondary | ICD-10-CM | POA: Diagnosis not present

## 2017-10-10 DIAGNOSIS — I10 Essential (primary) hypertension: Secondary | ICD-10-CM | POA: Diagnosis not present

## 2017-11-01 DIAGNOSIS — F411 Generalized anxiety disorder: Secondary | ICD-10-CM | POA: Diagnosis not present

## 2017-11-01 DIAGNOSIS — I10 Essential (primary) hypertension: Secondary | ICD-10-CM | POA: Diagnosis not present

## 2017-11-01 DIAGNOSIS — E039 Hypothyroidism, unspecified: Secondary | ICD-10-CM | POA: Diagnosis not present

## 2017-11-01 DIAGNOSIS — R413 Other amnesia: Secondary | ICD-10-CM | POA: Diagnosis not present

## 2017-11-30 DIAGNOSIS — M25551 Pain in right hip: Secondary | ICD-10-CM | POA: Diagnosis not present

## 2017-11-30 DIAGNOSIS — M25572 Pain in left ankle and joints of left foot: Secondary | ICD-10-CM | POA: Diagnosis not present

## 2017-12-05 DIAGNOSIS — M6281 Muscle weakness (generalized): Secondary | ICD-10-CM | POA: Diagnosis not present

## 2017-12-05 DIAGNOSIS — R262 Difficulty in walking, not elsewhere classified: Secondary | ICD-10-CM | POA: Diagnosis not present

## 2017-12-05 DIAGNOSIS — M25551 Pain in right hip: Secondary | ICD-10-CM | POA: Diagnosis not present

## 2017-12-06 ENCOUNTER — Other Ambulatory Visit: Payer: Self-pay

## 2017-12-06 DIAGNOSIS — M7989 Other specified soft tissue disorders: Secondary | ICD-10-CM

## 2017-12-11 DIAGNOSIS — R262 Difficulty in walking, not elsewhere classified: Secondary | ICD-10-CM | POA: Diagnosis not present

## 2017-12-11 DIAGNOSIS — M25551 Pain in right hip: Secondary | ICD-10-CM | POA: Diagnosis not present

## 2017-12-11 DIAGNOSIS — M6281 Muscle weakness (generalized): Secondary | ICD-10-CM | POA: Diagnosis not present

## 2017-12-13 DIAGNOSIS — R262 Difficulty in walking, not elsewhere classified: Secondary | ICD-10-CM | POA: Diagnosis not present

## 2017-12-13 DIAGNOSIS — M25551 Pain in right hip: Secondary | ICD-10-CM | POA: Diagnosis not present

## 2017-12-13 DIAGNOSIS — M6281 Muscle weakness (generalized): Secondary | ICD-10-CM | POA: Diagnosis not present

## 2018-01-02 DIAGNOSIS — I10 Essential (primary) hypertension: Secondary | ICD-10-CM | POA: Diagnosis not present

## 2018-01-02 DIAGNOSIS — Z79899 Other long term (current) drug therapy: Secondary | ICD-10-CM | POA: Diagnosis not present

## 2018-01-02 DIAGNOSIS — M810 Age-related osteoporosis without current pathological fracture: Secondary | ICD-10-CM | POA: Diagnosis not present

## 2018-01-02 DIAGNOSIS — I872 Venous insufficiency (chronic) (peripheral): Secondary | ICD-10-CM | POA: Diagnosis not present

## 2018-01-02 DIAGNOSIS — L659 Nonscarring hair loss, unspecified: Secondary | ICD-10-CM | POA: Diagnosis not present

## 2018-01-29 DIAGNOSIS — H1851 Endothelial corneal dystrophy: Secondary | ICD-10-CM | POA: Diagnosis not present

## 2018-01-29 DIAGNOSIS — H3554 Dystrophies primarily involving the retinal pigment epithelium: Secondary | ICD-10-CM | POA: Diagnosis not present

## 2018-01-29 DIAGNOSIS — H40013 Open angle with borderline findings, low risk, bilateral: Secondary | ICD-10-CM | POA: Diagnosis not present

## 2018-01-29 DIAGNOSIS — H16223 Keratoconjunctivitis sicca, not specified as Sjogren's, bilateral: Secondary | ICD-10-CM | POA: Diagnosis not present

## 2018-01-30 DIAGNOSIS — Z23 Encounter for immunization: Secondary | ICD-10-CM | POA: Diagnosis not present

## 2018-01-30 DIAGNOSIS — R42 Dizziness and giddiness: Secondary | ICD-10-CM | POA: Diagnosis not present

## 2018-01-30 DIAGNOSIS — I10 Essential (primary) hypertension: Secondary | ICD-10-CM | POA: Diagnosis not present

## 2018-01-30 DIAGNOSIS — M810 Age-related osteoporosis without current pathological fracture: Secondary | ICD-10-CM | POA: Diagnosis not present

## 2018-02-04 ENCOUNTER — Encounter (HOSPITAL_COMMUNITY): Payer: Medicare Other

## 2018-02-04 ENCOUNTER — Encounter: Payer: Medicare Other | Admitting: Surgery

## 2018-03-19 DIAGNOSIS — Z853 Personal history of malignant neoplasm of breast: Secondary | ICD-10-CM | POA: Diagnosis not present

## 2018-04-10 DIAGNOSIS — H3554 Dystrophies primarily involving the retinal pigment epithelium: Secondary | ICD-10-CM | POA: Diagnosis not present

## 2018-04-15 DIAGNOSIS — E039 Hypothyroidism, unspecified: Secondary | ICD-10-CM | POA: Diagnosis not present

## 2018-04-15 DIAGNOSIS — I10 Essential (primary) hypertension: Secondary | ICD-10-CM | POA: Diagnosis not present

## 2018-04-15 DIAGNOSIS — F5101 Primary insomnia: Secondary | ICD-10-CM | POA: Diagnosis not present

## 2018-04-15 DIAGNOSIS — R21 Rash and other nonspecific skin eruption: Secondary | ICD-10-CM | POA: Diagnosis not present

## 2018-04-15 DIAGNOSIS — L659 Nonscarring hair loss, unspecified: Secondary | ICD-10-CM | POA: Diagnosis not present

## 2018-04-29 DIAGNOSIS — L821 Other seborrheic keratosis: Secondary | ICD-10-CM | POA: Diagnosis not present

## 2018-04-29 DIAGNOSIS — L245 Irritant contact dermatitis due to other chemical products: Secondary | ICD-10-CM | POA: Diagnosis not present

## 2018-04-29 DIAGNOSIS — L659 Nonscarring hair loss, unspecified: Secondary | ICD-10-CM | POA: Diagnosis not present

## 2018-07-15 DIAGNOSIS — I10 Essential (primary) hypertension: Secondary | ICD-10-CM | POA: Diagnosis not present

## 2018-07-15 DIAGNOSIS — R413 Other amnesia: Secondary | ICD-10-CM | POA: Diagnosis not present

## 2018-10-01 ENCOUNTER — Other Ambulatory Visit: Payer: Self-pay

## 2018-10-01 ENCOUNTER — Encounter (INDEPENDENT_AMBULATORY_CARE_PROVIDER_SITE_OTHER): Payer: Medicare Other | Admitting: Ophthalmology

## 2018-10-01 DIAGNOSIS — H353132 Nonexudative age-related macular degeneration, bilateral, intermediate dry stage: Secondary | ICD-10-CM | POA: Diagnosis not present

## 2018-10-01 DIAGNOSIS — H43813 Vitreous degeneration, bilateral: Secondary | ICD-10-CM

## 2018-10-01 DIAGNOSIS — H35033 Hypertensive retinopathy, bilateral: Secondary | ICD-10-CM

## 2018-10-01 DIAGNOSIS — I10 Essential (primary) hypertension: Secondary | ICD-10-CM

## 2018-10-21 DIAGNOSIS — M25512 Pain in left shoulder: Secondary | ICD-10-CM | POA: Diagnosis not present

## 2018-10-21 DIAGNOSIS — Z1331 Encounter for screening for depression: Secondary | ICD-10-CM | POA: Diagnosis not present

## 2018-10-21 DIAGNOSIS — R413 Other amnesia: Secondary | ICD-10-CM | POA: Diagnosis not present

## 2018-11-12 DIAGNOSIS — R413 Other amnesia: Secondary | ICD-10-CM | POA: Diagnosis not present

## 2018-11-12 DIAGNOSIS — I10 Essential (primary) hypertension: Secondary | ICD-10-CM | POA: Diagnosis not present

## 2018-11-20 DIAGNOSIS — Z961 Presence of intraocular lens: Secondary | ICD-10-CM | POA: Diagnosis not present

## 2018-11-20 DIAGNOSIS — H1859 Other hereditary corneal dystrophies: Secondary | ICD-10-CM | POA: Diagnosis not present

## 2018-11-20 DIAGNOSIS — H16223 Keratoconjunctivitis sicca, not specified as Sjogren's, bilateral: Secondary | ICD-10-CM | POA: Diagnosis not present

## 2018-11-20 DIAGNOSIS — H18452 Nodular corneal degeneration, left eye: Secondary | ICD-10-CM | POA: Diagnosis not present

## 2018-11-20 DIAGNOSIS — H40013 Open angle with borderline findings, low risk, bilateral: Secondary | ICD-10-CM | POA: Diagnosis not present

## 2018-11-21 ENCOUNTER — Ambulatory Visit (INDEPENDENT_AMBULATORY_CARE_PROVIDER_SITE_OTHER): Payer: Medicare Other

## 2018-11-21 ENCOUNTER — Other Ambulatory Visit: Payer: Self-pay

## 2018-11-21 ENCOUNTER — Encounter: Payer: Self-pay | Admitting: Podiatry

## 2018-11-21 ENCOUNTER — Ambulatory Visit (INDEPENDENT_AMBULATORY_CARE_PROVIDER_SITE_OTHER): Payer: Medicare Other | Admitting: Podiatry

## 2018-11-21 VITALS — Temp 97.2°F

## 2018-11-21 DIAGNOSIS — M79675 Pain in left toe(s): Secondary | ICD-10-CM | POA: Diagnosis not present

## 2018-11-21 DIAGNOSIS — M79674 Pain in right toe(s): Secondary | ICD-10-CM

## 2018-11-21 DIAGNOSIS — L84 Corns and callosities: Secondary | ICD-10-CM | POA: Diagnosis not present

## 2018-11-21 DIAGNOSIS — M2041 Other hammer toe(s) (acquired), right foot: Secondary | ICD-10-CM

## 2018-11-21 DIAGNOSIS — B351 Tinea unguium: Secondary | ICD-10-CM

## 2018-11-21 DIAGNOSIS — M2042 Other hammer toe(s) (acquired), left foot: Secondary | ICD-10-CM | POA: Diagnosis not present

## 2018-11-21 NOTE — Progress Notes (Signed)
Subjective:   Patient ID: Sarah [name], female   DOB: 85 y.o.   MRN: [id number]   HPI Patient presents with caregiver stating that her feet are just really sore and she has hammertoe deformity and she is looking to try to get some relief with severe callus formation and nail disease.  Patient does not smoke and is not active currently   Review of Systems  All other systems reviewed and are negative.       Objective:  Physical Exam Vitals signs and nursing note reviewed.  Constitutional:      Appearance: She is well-developed.  Pulmonary:     Effort: Pulmonary effort is normal.  Musculoskeletal: Normal range of motion.  Skin:    General: Skin is warm.  Neurological:     Mental Status: She is alert.     Neurovascular status found to be moderately diminished with diminished sharp dull vibratory.  Patient is found to have significant structural deformity of both feet with hammertoes of the rigid nature with prominent metatarsals with keratotic lesion sub-1 3 right and third digit right over left.  Very tender when pressed makes it very hard for her to walk or be comfortable     Assessment:  Difficult condition with well oriented lady who does have significant structural malalignment digital deformity and nail disease with pain along with lesion formation     Plan:  8 and PA x-ray reviewed all conditions discussed.  At this time sterile debridement of all lesions accomplished and I do recommend orthotics and patient is scanned for customized orthotic devices.  Patient will be seen back for [place] to recheck when ready and I debrided nailbeds 1-5 both feet which will be done on a routine basis by our office  X-rays indicate significant structural deformity of both feet with digital deformity and moderate osteoporosis osteoarthritis

## 2018-11-21 NOTE — Patient Instructions (Signed)
Hammer Toe  Hammer toe is a change in the shape (a deformity) of your toe. The deformity causes the middle joint of your toe to stay bent. This causes pain, especially when you are wearing shoes. Hammer toe starts gradually. At first, the toe can be straightened. Gradually over time, the deformity becomes stiff and permanent. Early treatments to keep the toe straight may relieve pain. As the deformity becomes stiff and permanent, surgery may be needed to straighten the toe. What are the causes? Hammer toe is caused by abnormal bending of the toe joint that is closest to your foot. It happens gradually over time. This pulls on the muscles and connections (tendons) of the toe joint, making them weak and stiff. It is often related to wearing shoes that are too short or narrow and do not let your toes straighten. What increases the risk? You may be at greater risk for hammer toe if you:  Are female.  Are older.  Wear shoes that are too small.  Wear high-heeled shoes that pinch your toes.  Are a Engineer, mining.  Have a second toe that is longer than your big toe (first toe).  Injure your foot or toe.  Have arthritis.  Have a family history of hammer toe.  Have a nerve or muscle disorder. What are the signs or symptoms? The main symptoms of this condition are pain and deformity of the toe. The pain is worse when wearing shoes, walking, or running. Other symptoms may include:  Corns or calluses over the bent part of the toe or between the toes.  Redness and a burning feeling on the toe.  An open sore that forms on the top of the toe.  Not being able to straighten the toe. How is this diagnosed? This condition is diagnosed based on your symptoms and a physical exam. During the exam, your health care provider will try to straighten your toe to see how stiff the deformity is. You may also have tests, such as:  A blood test to check for rheumatoid arthritis.  An X-ray to show how  severe the deformity is. How is this treated? Treatment for this condition will depend on how stiff the deformity is. Surgery is often needed. However, sometimes a hammer toe can be straightened without surgery. Treatments that do not involve surgery include:  Taping the toe into a straightened position.  Using pads and cushions to protect the toe (orthotics).  Wearing shoes that provide enough room for the toes.  Doing toe-stretching exercises at home.  Taking an NSAID to reduce pain and swelling. If these treatments do not help or the toe cannot be straightened, surgery is the next option. The most common surgeries used to straighten a hammer toe include:  Arthroplasty. In this procedure, part of the joint is removed, and that allows the toe to straighten.  Fusion. In this procedure, cartilage between the two bones of the joint is taken out and the bones are fused together into one longer bone.  Implantation. In this procedure, part of the bone is removed and replaced with an implant to let the toe move again.  Flexor tendon transfer. In this procedure, the tendons that curl the toes down (flexor tendons) are repositioned. Follow these instructions at home:  Take over-the-counter and prescription medicines only as told by your health care provider.  Do toe straightening and stretching exercises as told by your health care provider.  Keep all follow-up visits as told by your health care  provider. This is important. How is this prevented?  Wear shoes that give your toes enough room and do not cause pain.  Do not wear high-heeled shoes. Contact a health care provider if:  Your pain gets worse.  Your toe becomes red or swollen.  You develop an open sore on your toe. This information is not intended to replace advice given to you by your health care provider. Make sure you discuss any questions you have with your health care provider. Document Released: 05/05/2000 Document  Revised: 04/20/2017 Document Reviewed: 09/01/2015 Elsevier Patient Education  2020 [employer].

## 2018-12-12 ENCOUNTER — Other Ambulatory Visit: Payer: Medicare Other | Admitting: Orthotics

## 2018-12-19 ENCOUNTER — Other Ambulatory Visit: Payer: Self-pay

## 2018-12-19 ENCOUNTER — Ambulatory Visit: Payer: Medicare Other | Admitting: Orthotics

## 2018-12-19 DIAGNOSIS — M79674 Pain in right toe(s): Secondary | ICD-10-CM

## 2018-12-19 DIAGNOSIS — L84 Corns and callosities: Secondary | ICD-10-CM

## 2018-12-19 DIAGNOSIS — M2041 Other hammer toe(s) (acquired), right foot: Secondary | ICD-10-CM

## 2018-12-19 DIAGNOSIS — M79675 Pain in left toe(s): Secondary | ICD-10-CM

## 2018-12-19 DIAGNOSIS — B351 Tinea unguium: Secondary | ICD-10-CM

## 2018-12-19 NOTE — Progress Notes (Signed)
Patient came in today to pick up custom made foot orthotics.  The goals were accomplished and the patient reported no dissatisfaction with said orthotics.  Patient was advised of breakin period and how to report any issues. 

## 2018-12-27 ENCOUNTER — Other Ambulatory Visit: Payer: Self-pay

## 2018-12-27 ENCOUNTER — Ambulatory Visit: Payer: Medicare Other | Admitting: Orthotics

## 2018-12-27 DIAGNOSIS — M2041 Other hammer toe(s) (acquired), right foot: Secondary | ICD-10-CM

## 2018-12-27 DIAGNOSIS — L84 Corns and callosities: Secondary | ICD-10-CM

## 2018-12-27 DIAGNOSIS — M2042 Other hammer toe(s) (acquired), left foot: Secondary | ICD-10-CM

## 2019-01-01 NOTE — Progress Notes (Signed)
Trimmed f/o

## 2019-01-16 DIAGNOSIS — H1859 Other hereditary corneal dystrophies: Secondary | ICD-10-CM | POA: Diagnosis not present

## 2019-01-16 DIAGNOSIS — Z961 Presence of intraocular lens: Secondary | ICD-10-CM | POA: Diagnosis not present

## 2019-01-16 DIAGNOSIS — H18452 Nodular corneal degeneration, left eye: Secondary | ICD-10-CM | POA: Diagnosis not present

## 2019-01-16 DIAGNOSIS — H16223 Keratoconjunctivitis sicca, not specified as Sjogren's, bilateral: Secondary | ICD-10-CM | POA: Diagnosis not present

## 2019-01-23 DIAGNOSIS — J309 Allergic rhinitis, unspecified: Secondary | ICD-10-CM | POA: Diagnosis not present

## 2019-01-23 DIAGNOSIS — J3089 Other allergic rhinitis: Secondary | ICD-10-CM | POA: Diagnosis not present

## 2019-01-24 DIAGNOSIS — I10 Essential (primary) hypertension: Secondary | ICD-10-CM | POA: Diagnosis not present

## 2019-01-24 DIAGNOSIS — R21 Rash and other nonspecific skin eruption: Secondary | ICD-10-CM | POA: Diagnosis not present

## 2019-01-24 DIAGNOSIS — Z79899 Other long term (current) drug therapy: Secondary | ICD-10-CM | POA: Diagnosis not present

## 2019-02-02 ENCOUNTER — Encounter (HOSPITAL_COMMUNITY): Payer: Self-pay | Admitting: Emergency Medicine

## 2019-02-02 ENCOUNTER — Other Ambulatory Visit: Payer: Self-pay

## 2019-02-02 ENCOUNTER — Emergency Department (HOSPITAL_COMMUNITY)
Admission: EM | Admit: 2019-02-02 | Discharge: 2019-02-02 | Disposition: A | Discharge status: Home / Self Care | Payer: Medicare Other | Attending: Emergency Medicine | Admitting: Emergency Medicine

## 2019-02-02 DIAGNOSIS — Z96653 Presence of artificial knee joint, bilateral: Secondary | ICD-10-CM | POA: Insufficient documentation

## 2019-02-02 DIAGNOSIS — I10 Essential (primary) hypertension: Secondary | ICD-10-CM | POA: Diagnosis not present

## 2019-02-02 DIAGNOSIS — L299 Pruritus, unspecified: Secondary | ICD-10-CM

## 2019-02-02 DIAGNOSIS — Z79899 Other long term (current) drug therapy: Secondary | ICD-10-CM | POA: Diagnosis not present

## 2019-02-02 DIAGNOSIS — E039 Hypothyroidism, unspecified: Secondary | ICD-10-CM | POA: Diagnosis not present

## 2019-02-02 NOTE — Discharge Instructions (Addendum)
Continue Zyrtec as previously prescribed.  Follow-up with an allergist in the near future.  The contact information for Dr. Fredderick Phenix has been provided in this discharge summary for you to call and make these arrangements.  Return to the emergency department in the meantime if you develop difficulty breathing, throat swelling, or other new and concerning symptoms.

## 2019-02-02 NOTE — ED Triage Notes (Signed)
PT reports having itching that has been going on for the last week and has been seen at urgent care and PCP. Pt has finished a 7 day pack of Prednisone with no improvement along with Zyrtec. No acute distress noted at this time. Pt able to speak in full sentences. PT reports feeling like lips are big but no obvious swelling.

## 2019-02-02 NOTE — ED Provider Notes (Signed)
[facility] DEPT Provider Note   CSN: [id number] Arrival date & time: 02/02/19  2011     History   Chief Complaint Chief Complaint  Patient presents with  . Pruritis    HPI Sarah [name] is a 85 y.o. female.     Patient is an 85 year old female with past medical history of breast cancer, hypertension, hyperlipidemia.  She presents today with complaints of itching.  She believes she is having some sort of an allergic reaction, but denies any new contacts or exposures.  She denies any fevers or chills.  She denies any difficulty breathing or swallowing.  She does feel as though her lips are swollen.  Patient has been seen by her primary doctor and treated with prednisone, Zyrtec, but is not improving.  The history is provided by the patient.    Past Medical History:  Diagnosis Date  . Allergy    codeine, thiazides  . Anxiety    new dx  . Arthritis   . Breast cancer ([place]) 03/14/12   bx=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,ER/PR=+,upper inner quad  . Cancer ([place])    breast  . Cataract   . Glaucoma    laser treated years ago  . HOH (hard of hearing)   . Hyperlipemia   . Hypertension   . Hypothyroidism   . Pancreatic cyst    benign  . PONV (postoperative nausea and vomiting)   . Radiation 06/11/2012-07/12/2012   17 sessions 4250 cGy, 3 sessions 750 cGy  . Vertigo   . Wears glasses   . Wears partial dentures    partial upper    Patient Active Problem List   Diagnosis Date Noted  . Osteopenia 12/18/2013  . Heart block AV first degree 12/18/2013  . Breast cancer of upper-inner quadrant of right female breast ([place]) 03/19/2012  . Right knee DJD 08/18/2011  . Hypothyroidism   . Hypertension   . PONV (postoperative nausea and vomiting)   . Hyperlipemia     Past Surgical History:  Procedure Laterality Date  . ABDOMINAL HYSTERECTOMY  1966   1/2 ovary left in   . BREAST SURGERY  1992   lumpectomy-lt  . CATARACT  EXTRACTION     b/l  . COLONOSCOPY    . EYE SURGERY     bilateral cataract removal  . JOINT REPLACEMENT  2013   rt total knee  . JOINT REPLACEMENT  1995   lt total knee  . PARTIAL MASTECTOMY WITH NEEDLE LOCALIZATION  04/09/2012   Procedure: PARTIAL MASTECTOMY WITH NEEDLE LOCALIZATION;  Surgeon: Adin Hector, MD;  Location: [facility];  Service: General;  Laterality: Right;  . TONSILLECTOMY    . TOTAL KNEE ARTHROPLASTY  08/16/2011   Procedure: TOTAL KNEE ARTHROPLASTY;  Surgeon: Ninetta Lights, MD;  Location: [facility];  Service: Orthopedics;  Laterality: Right;     OB History   No obstetric history on file.      Home Medications    Prior to Admission medications   Medication Sig Start Date End Date Taking? Authorizing Provider  amLODipine (NORVASC) 5 MG tablet Take 3.5 mg by mouth 2 (two) times daily.     [provider]  Biotin 10000 MCG TABS Take 1,000 mg by mouth every morning. 02/01/16   Magrinat, Virgie Dad, MD  Cholecalciferol (D3-1000 PO) Take by mouth daily.    [provider]  levothyroxine (SYNTHROID) 25 MCG tablet  09/20/18   [provider]  meclizine Johnathan Hausen)  25 MG tablet Take 1 tablet (25 mg total) by mouth 3 (three) times daily as needed for dizziness. 02/01/16   Magrinat, Virgie Dad, MD  Misc Natural Products (GRAPE SEED COMPLEX PO) Take by mouth 2 (two) times daily.    [provider]  Multiple Vitamins-Minerals (PRESERVISION AREDS PO) Take by mouth daily.    [provider]  vitamin B-12 (CYANOCOBALAMIN) 100 MCG tablet Take 50 mcg by mouth daily.    [provider]  zolpidem (AMBIEN) 10 MG tablet Take 10 mg by mouth. Pt takes 1/4 of a tablet 07/24/14   [provider]    Family History Family History  Problem Relation Age of Onset  . Heart disease Father   . Cancer Maternal Aunt        stomach    Social History Social History   Tobacco Use  . Smoking status: Never Smoker  .  Smokeless tobacco: Never Used  Substance Use Topics  . Alcohol use: No  . Drug use: No     Allergies   Codeine and Thiazide-type diuretics   Review of Systems Review of Systems  All other systems reviewed and are negative.    Physical Exam Updated Vital Signs BP (!) 146/96 (BP Location: Right Arm)   Pulse 68   Temp 98.4 F (36.9 C) (Oral)   Resp 16   Ht 5\' 4"  (1.626 m)   Wt 54.4 kg   SpO2 98%   BMI 20.60 kg/m   Physical Exam Vitals signs and nursing note reviewed.  Constitutional:      General: She is not in acute distress.    Appearance: She is well-developed. She is not diaphoretic.  HENT:     Head: Normocephalic and atraumatic.     Mouth/Throat:     Mouth: Mucous membranes are moist.     Pharynx: No oropharyngeal exudate.     Comments: No significant swelling is noted. Neck:     Musculoskeletal: Normal range of motion and neck supple.  Cardiovascular:     Rate and Rhythm: Normal rate and regular rhythm.     Heart sounds: No murmur. No friction rub. No gallop.   Pulmonary:     Effort: Pulmonary effort is normal. No respiratory distress.     Breath sounds: Normal breath sounds. No wheezing.  Abdominal:     General: Bowel sounds are normal. There is no distension.     Palpations: Abdomen is soft.     Tenderness: There is no abdominal tenderness.  Musculoskeletal: Normal range of motion.  Skin:    General: Skin is warm and dry.     Findings: No rash.     Comments: Patient with pruritus, but I am unable to appreciate any obvious rash or hives.  Neurological:     Mental Status: She is alert and oriented to person, place, and time.      ED Treatments / Results  Labs (all labs ordered are listed, but only abnormal results are displayed) Labs Reviewed - No data to display  EKG None  Radiology No results found.  Procedures Procedures (including critical care time)  Medications Ordered in ED Medications - No data to display   Initial  Impression / Assessment and Plan / ED Course  I have reviewed the triage vital signs and the nursing notes.  Pertinent labs & imaging results that were available during my care of the patient were reviewed by me and considered in my medical decision making (see chart for details).  I am uncertain as to the etiology of the patient's itching, however I suspect some sort of allergic component.  She is currently taking Zyrtec and just completed a course of prednisone.  Patient has an appointment to see her doctor tomorrow and I have advised her to keep this appointment.  Patient is to have a follow-up appointment with an allergist and this will be arranged by her primary doctor.  Final Clinical Impressions(s) / ED Diagnoses   Final diagnoses:  None    ED Discharge Orders    None       Veryl Speak, MD 02/02/19 2320

## 2019-02-03 DIAGNOSIS — H16223 Keratoconjunctivitis sicca, not specified as Sjogren's, bilateral: Secondary | ICD-10-CM | POA: Diagnosis not present

## 2019-02-03 DIAGNOSIS — H18452 Nodular corneal degeneration, left eye: Secondary | ICD-10-CM | POA: Diagnosis not present

## 2019-02-03 DIAGNOSIS — H1859 Other hereditary corneal dystrophies: Secondary | ICD-10-CM | POA: Diagnosis not present

## 2019-02-03 DIAGNOSIS — H40013 Open angle with borderline findings, low risk, bilateral: Secondary | ICD-10-CM | POA: Diagnosis not present

## 2019-02-03 DIAGNOSIS — Z961 Presence of intraocular lens: Secondary | ICD-10-CM | POA: Diagnosis not present

## 2019-02-03 DIAGNOSIS — H3554 Dystrophies primarily involving the retinal pigment epithelium: Secondary | ICD-10-CM | POA: Diagnosis not present

## 2019-02-06 ENCOUNTER — Other Ambulatory Visit: Payer: Self-pay

## 2019-02-06 ENCOUNTER — Ambulatory Visit (INDEPENDENT_AMBULATORY_CARE_PROVIDER_SITE_OTHER): Payer: Medicare Other | Admitting: Allergy

## 2019-02-06 ENCOUNTER — Encounter: Payer: Self-pay | Admitting: Allergy

## 2019-02-06 VITALS — BP 120/60 | HR 96 | Temp 96.9°F | Resp 16 | Ht 64.5 in | Wt 117.5 lb

## 2019-02-06 DIAGNOSIS — J3089 Other allergic rhinitis: Secondary | ICD-10-CM | POA: Diagnosis not present

## 2019-02-06 DIAGNOSIS — L299 Pruritus, unspecified: Secondary | ICD-10-CM

## 2019-02-06 DIAGNOSIS — R0602 Shortness of breath: Secondary | ICD-10-CM

## 2019-02-06 DIAGNOSIS — L282 Other prurigo: Secondary | ICD-10-CM | POA: Insufficient documentation

## 2019-02-06 DIAGNOSIS — T783XXA Angioneurotic edema, initial encounter: Secondary | ICD-10-CM | POA: Insufficient documentation

## 2019-02-06 DIAGNOSIS — R21 Rash and other nonspecific skin eruption: Secondary | ICD-10-CM | POA: Diagnosis not present

## 2019-02-06 DIAGNOSIS — T783XXD Angioneurotic edema, subsequent encounter: Secondary | ICD-10-CM

## 2019-02-06 DIAGNOSIS — R220 Localized swelling, mass and lump, head: Secondary | ICD-10-CM | POA: Diagnosis not present

## 2019-02-06 MED ORDER — HYDROXYZINE HCL 10 MG PO TABS: 10.0000 mg | ORAL_TABLET | Freq: Every evening | ORAL | 1 refills | Status: DC | PRN

## 2019-02-06 NOTE — Assessment & Plan Note (Signed)
Episodes of SOB. No other symptoms. No history of asthma/COPD or previous inhaler use.  Today's spirometry showed some restriction but exhalation loop was not ideal.  Monitor symptoms.

## 2019-02-06 NOTE — Assessment & Plan Note (Signed)
Noted some lip/tongue/periorbital edema but not appreciated on exam today. No respiratory compromise.  Get bloodwork as below.  For mild symptoms you can take over the counter antihistamines such as Benadryl and monitor symptoms closely. If symptoms worsen or if you have severe symptoms including breathing issues, throat closure, significant swelling, whole body hives, severe diarrhea and vomiting, lightheadedness then seek immediate medical care.

## 2019-02-06 NOTE — Assessment & Plan Note (Signed)
   See assessment and plan as above for pruritus.

## 2019-02-06 NOTE — Progress Notes (Signed)
New Patient Note  RE: Sarah [name] MRN: [id number] DOB: 09-27-1932 Date of Office Visit: 02/06/2019  Referring provider: No ref. provider found Primary care provider: Lajean Manes, MD  Chief Complaint: Allergic Rhinitis   History of Present Illness: I had the pleasure of seeing Sarah [name] for initial evaluation at the [facility] on 02/06/2019. She is a 85 y.o. female, who is self-referred for the evaluation of allergies. She is accompanied today by her son who provided/contributed to the history.   Patient started to have pruritus, sore throat, eye swelling, tongue/lip seem to be swollen, dry mouth, sneezing and fatigue. The itching seems to be keeping her up at night. This started about 3 weeks ago. Patient has no history of itching or allergies in her life. Sometimes she has associated red dots. This can occur anywhere on her body. The rashes seem to stay there for a few days at a time. No ecchymosis upon resolution. This can occur anytime during the day or at night.   Patient had some mold in her office area at home. She had this cleaned up since then.   Suspected triggers are unknown. Denies any fevers, chills, changes in medications, foods, personal care products or recent infections. She has tried the following therapies: zyrtec, Singulair with no benefit. Systemic steroids yes one course with no benefit. Currently on zyrtec and Singulair.  Previous work up includes: yes but unsure of what bloodwork was drawn. Previous history of rash/hives: no. Patient is up to date with the following cancer screening tests: colonoscopy, mammogram. No pap smears. Patient has history of breast cancer and currently in remission.   02/02/2019 ER visit: "Patient is an 85 year old female with past medical history of breast cancer, hypertension, hyperlipidemia.  She presents today with complaints of itching.  She believes she is having some sort of an allergic reaction, but  denies any new contacts or exposures.  She denies any fevers or chills.  She denies any difficulty breathing or swallowing.  She does feel as though her lips are swollen.  Patient has been seen by her primary doctor and treated with prednisone, Zyrtec, but is not improving."  Assessment and Plan: Sarah [name] is a 85 y.o. female with: Pruritus 3 week history of pruritus with associated rash, sore throat, ? eye/tongue/lip swelling, sneezing, fatigue and dry mouth. Sometimes has episodes of SOB. No triggers noted. No previous history of allergies or rashes. H/o breast cancer. Tried zyrtec, Singulair and steroids with no benefit. Denies any changes in diet, meds, personal care products.  Based on clinical history, no cause is identified. Will get additional work up as below.   Unable to skin test today due to recent antihistamine intake.  Get bloodwork and will make additional recommendations based on results.   Start ceterizine 10mg  daily in the morning.  Take hydroxyzine 10mg  1 hour before bedtime as needed for itching. This may make you tired.  Stop Singulair as ineffective.   Start proper skin care measures.  Keep journal of your symptoms and write down what you had come across with during a flare.  Take pictures of the rash if able to.  For mild symptoms you can take over the counter antihistamines such as Benadryl and monitor symptoms closely. If symptoms worsen or if you have severe symptoms including breathing issues, throat closure, significant swelling, whole body hives, severe diarrhea and vomiting, lightheadedness then seek immediate medical care.  If still has persistent pruritus and rash with above  regimen, then will stop any over the counter supplements at the next visit and perform skin testing to foods.   Rash and other nonspecific skin eruption  See assessment and plan as above for pruritus.   Angio-edema Noted some lip/tongue/periorbital edema but not appreciated on exam  today. No respiratory compromise.  Get bloodwork as below.  For mild symptoms you can take over the counter antihistamines such as Benadryl and monitor symptoms closely. If symptoms worsen or if you have severe symptoms including breathing issues, throat closure, significant swelling, whole body hives, severe diarrhea and vomiting, lightheadedness then seek immediate medical care.   Shortness of breath Episodes of SOB. No other symptoms. No history of asthma/COPD or previous inhaler use.  Today's spirometry showed some restriction but exhalation loop was not ideal.  Monitor symptoms.  Return in about 4 weeks (around 03/06/2019).  Meds ordered this encounter  Medications   hydrOXYzine (ATARAX/VISTARIL) 10 MG tablet    Sig: Take 1 tablet (10 mg total) by mouth at bedtime as needed for itching.    Dispense:  30 tablet    Refill:  1    Lab Orders     ANA w/Reflex     C1 Esterase Inhibitor     C1 esterase inhibitor, functional     CBC with Differential/Platelet     C3 and C4     Complement component c1q     Comprehensive metabolic panel     Tryptase     Thyroid Cascade Profile     Allergens w/Total IgE Area 2     Alpha-Gal Panel     Protein electrophoresis, serum     Protein Electrophoresis, Urine Rflx.  Other allergy screening: Asthma: no Rhino conjunctivitis: no Food allergy: no Medication allergy: yes  Codeine and thiazide  Hymenoptera allergy: no Eczema:no History of recurrent infections suggestive of immunodeficency: no  Diagnostics: Spirometry:  Tracings reviewed. Her effort: It was hard to get consistent efforts and there is a question as to whether this reflects a maximal maneuver. FVC: 1.62L FEV1: 1.47L, 78% predicted FEV1/FVC ratio: 91% Interpretation: Spirometry consistent with possible restrictive disease but exhalation loop was not ideal. Please see scanned spirometry results for details.  Skin Testing: Deferred due to recent antihistamines  use.  Past Medical History: Patient Active Problem List   Diagnosis Date Noted   Pruritus 02/06/2019   Rash and other nonspecific skin eruption 02/06/2019   Angio-edema 02/06/2019   Shortness of breath 02/06/2019   Osteopenia 12/18/2013   Heart block AV first degree 12/18/2013   Breast cancer of upper-inner quadrant of right female breast ([facility]) 03/19/2012   Right knee DJD 08/18/2011   Hypothyroidism    Hypertension    PONV (postoperative nausea and vomiting)    Hyperlipemia    Past Medical History:  Diagnosis Date   Allergy    codeine, thiazides   Anxiety    new dx   Arthritis    Breast cancer ([facility]) 03/14/12   bx=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,ER/PR=+,upper inner quad   Cancer ([facility])    breast   Cataract    Glaucoma    laser treated years ago   HOH (hard of hearing)    Hyperlipemia    Hypertension    Hypothyroidism    Pancreatic cyst    benign   PONV (postoperative nausea and vomiting)    Radiation 06/11/2012-07/12/2012   17 sessions 4250 cGy, 3 sessions 750 cGy   Vertigo    Wears glasses  Wears partial dentures    partial upper   Past Surgical History: Past Surgical History:  Procedure Laterality Date   ABDOMINAL HYSTERECTOMY  1966   1/2 ovary left in    [place]   lumpectomy-lt   CATARACT EXTRACTION     b/l   COLONOSCOPY     EYE SURGERY     bilateral cataract removal   JOINT REPLACEMENT  2013   rt total knee   JOINT REPLACEMENT  1995   lt total knee   PARTIAL MASTECTOMY WITH NEEDLE LOCALIZATION  04/09/2012   Procedure: PARTIAL MASTECTOMY WITH NEEDLE LOCALIZATION;  Surgeon: Adin Hector, MD;  Location: [facility];  Service: General;  Laterality: Right;   TONSILLECTOMY     TOTAL KNEE ARTHROPLASTY  08/16/2011   Procedure: TOTAL KNEE ARTHROPLASTY;  Surgeon: Ninetta Lights, MD;  Location: [facility];  Service: Orthopedics;  Laterality: Right;   Medication List:   Current Outpatient Medications  Medication Sig Dispense Refill   amLODipine (NORVASC) 2.5 MG tablet      Biotin 10000 MCG TABS Take 1,000 mg by mouth every morning. 90 tablet 4   Calcium Carbonate (CALCIUM 600 PO) Take 600 mg by mouth daily.     cetirizine (ZYRTEC) 10 MG tablet Take 10 mg by mouth daily.     Cholecalciferol (D3-1000 PO) Take by mouth daily.     Krill Oil (OMEGA-3) 500 MG CAPS Take 500 mg by mouth daily.     levothyroxine (SYNTHROID) 25 MCG tablet      Multiple Vitamins-Minerals (PRESERVISION AREDS PO) Take by mouth daily.     vitamin B-12 (CYANOCOBALAMIN) 100 MCG tablet Take 50 mcg by mouth daily.     vitamin E 400 UNIT capsule Take 400 Units by mouth daily.     hydrOXYzine (ATARAX/VISTARIL) 10 MG tablet Take 1 tablet (10 mg total) by mouth at bedtime as needed for itching. 30 tablet 1   meclizine (ANTIVERT) 25 MG tablet Take 1 tablet (25 mg total) by mouth 3 (three) times daily as needed for dizziness. (Patient not taking: Reported on 02/06/2019) 30 tablet 0   Misc Natural Products (GRAPE SEED COMPLEX PO) Take by mouth 2 (two) times daily.     zolpidem (AMBIEN) 10 MG tablet Take 10 mg by mouth. Pt takes 1/4 of a tablet     No current facility-administered medications for this visit.    Allergies: Allergies  Allergen Reactions   Codeine Other (See Comments)    Unknown reaction   Thiazide-Type Diuretics Other (See Comments)    Blurry vision   Social History: Social History   Socioeconomic History   Marital status: Widowed    Spouse name: Not on file   Number of children: 2   Years of education: Not on file   Highest education level: Not on file  Occupational History   Occupation: Retired    Comment: Interior and spatial designer strain: Not on file   Food insecurity    Worry: Not on file    Inability: Not on Lexicographer needs    Medical: Not on file    Non-medical: Not on file  Tobacco Use    Smoking status: Never Smoker   Smokeless tobacco: Never Used  Substance and Sexual Activity   Alcohol use: No   Drug use: No   Sexual activity: Never  Lifestyle   Physical activity    Days per week: Not on file  Minutes per session: Not on file   Stress: Not on file  Relationships   Social connections    Talks on phone: Not on file    Gets together: Not on file    Attends religious service: Not on file    Active member of club or organization: Not on file    Attends meetings of clubs or organizations: Not on file    Relationship status: Not on file  Other Topics Concern   Not on file  Social History Narrative   Not on file   Lives in a house which is 83 year old. Smoking: denies Occupation: retired  Programme researcher, broadcasting/film/video HistoryFreight forwarder in the house: no Charity fundraiser in the family room: no Carpet in the bedroom: no Heating: gas Cooling: central Pet: yes 1 cat x 2-3 years  Family History: Family History  Problem Relation Age of Onset   Heart disease Father    Cancer Maternal Aunt        stomach   Problem                               Relation Asthma                                   No  Eczema                                No  Food allergy                          No  Allergic rhino conjunctivitis     No   Review of Systems  Constitutional: Negative for appetite change, chills, fever and unexpected weight change.  HENT: Positive for sneezing and sore throat. Negative for congestion and rhinorrhea.   Eyes: Negative for itching.  Respiratory: Positive for shortness of breath. Negative for cough, chest tightness and wheezing.   Cardiovascular: Negative for chest pain.  Gastrointestinal: Negative for abdominal pain.  Genitourinary: Negative for difficulty urinating.  Skin: Positive for rash.  Allergic/Immunologic: Negative for environmental allergies and food allergies.  Neurological: Negative for headaches.   Objective: BP 120/60 (BP Location:  Right Arm, Patient Position: Sitting, Cuff Size: Normal)    Pulse 96    Temp (!) 96.9 F (36.1 C) (Temporal)    Resp 16    Ht 5' 4.5" (1.638 m)    Wt 117 lb 8 oz (53.3 kg)    SpO2 96%    BMI 19.86 kg/m  Body mass index is 19.86 kg/m. Physical Exam  Constitutional: She is oriented to person, place, and time. She appears well-developed and well-nourished.  HENT:  Head: Normocephalic and atraumatic.  Right Ear: External ear normal.  Left Ear: External ear normal.  Nose: Nose normal.  Mouth/Throat: Oropharynx is clear and moist.  No angioedema appreciated on exam.  Eyes: Conjunctivae and EOM are normal.  Neck: Neck supple.  Cardiovascular: Normal rate, regular rhythm and normal heart sounds. Exam reveals no gallop and no friction rub.  No murmur heard. Pulmonary/Chest: Effort normal and breath sounds normal. She has no wheezes. She has no rales.  Abdominal: Soft.  Musculoskeletal:        General: Edema (left sided lower extremity pitting edema) present.  Neurological: She is alert and  oriented to person, place, and time.  Skin: Skin is warm. Rash noted.  Erythematous hue on upper back.  Psychiatric: She has a normal mood and affect. Her behavior is normal.  Nursing note and vitals reviewed.  The plan was reviewed with the patient/family, and all questions/concerned were addressed.  It was my pleasure to see Shaquna today and participate in her care. Please feel free to contact me with any questions or concerns.  Sincerely,  Rexene Alberts, DO Allergy & Immunology  [facility] office: [phone] [facility] office: [place] office: [phone]

## 2019-02-06 NOTE — Patient Instructions (Addendum)
Get bloodwork and will make additional recommendations based on results.   Start ceterizine 10mg  daily in the morning. Take hydroxyzine 10mg  1 hour before bedtime as needed for itching. This may make you tired.  Start proper skin care measures.  Keep journal of your symptoms and write down what you had come across with during a flare. Take pictures of the rash if able to  For mild symptoms you can take over the counter antihistamines such as Benadryl and monitor symptoms closely. If symptoms worsen or if you have severe symptoms including breathing issues, throat closure, significant swelling, whole body hives, severe diarrhea and vomiting, lightheadedness then seek immediate medical care.  Follow up in 4 weeks or sooner if needed.   Skin care recommendations  Bath time: . Always use lukewarm water. AVOID very hot or cold water. Marland Kitchen Keep bathing time to 5-10 minutes. . Do NOT use bubble bath. . Use a mild soap and use just enough to wash the dirty areas. . Do NOT scrub skin vigorously.  . After bathing, pat dry your skin with a towel. Do NOT rub or scrub the skin.  Moisturizers and prescriptions:  . ALWAYS apply moisturizers immediately after bathing (within 3 minutes). This helps to lock-in moisture. . Use the moisturizer several times a day over the whole body. Kermit Balo summer moisturizers include: Aveeno, CeraVe, Cetaphil. Kermit Balo winter moisturizers include: Aquaphor, Vaseline, Cerave, Cetaphil, Eucerin, Vanicream. . When using moisturizers along with medications, the moisturizer should be applied about one hour after applying the medication to prevent diluting effect of the medication or moisturize around where you applied the medications. When not using medications, the moisturizer can be continued twice daily as maintenance.  Laundry and clothing: . Avoid laundry products with added color or perfumes. . Use unscented hypo-allergenic laundry products such as Tide free, Cheer free &  gentle, and All free and clear.  . If the skin still seems dry or sensitive, you can try double-rinsing the clothes. . Avoid tight or scratchy clothing such as wool. . Do not use fabric softeners or dyer sheets.

## 2019-02-06 NOTE — Assessment & Plan Note (Addendum)
3 week history of pruritus with associated rash, sore throat, ? eye/tongue/lip swelling, sneezing, fatigue and dry mouth. Sometimes has episodes of SOB. No triggers noted. No previous history of allergies or rashes. H/o breast cancer. Tried zyrtec, Singulair and steroids with no benefit. Denies any changes in diet, meds, personal care products.  Based on clinical history, no cause is identified. Will get additional work up as below.   Unable to skin test today due to recent antihistamine intake.  Get bloodwork and will make additional recommendations based on results.   Start ceterizine 10mg  daily in the morning.  Take hydroxyzine 10mg  1 hour before bedtime as needed for itching. This may make you tired.  Stop Singulair as ineffective.   Start proper skin care measures.  Keep journal of your symptoms and write down what you had come across with during a flare.  Take pictures of the rash if able to.  For mild symptoms you can take over the counter antihistamines such as Benadryl and monitor symptoms closely. If symptoms worsen or if you have severe symptoms including breathing issues, throat closure, significant swelling, whole body hives, severe diarrhea and vomiting, lightheadedness then seek immediate medical care.  If still has persistent pruritus and rash with above regimen, then will stop any over the counter supplements at the next visit and perform skin testing to foods.

## 2019-02-07 DIAGNOSIS — R0981 Nasal congestion: Secondary | ICD-10-CM | POA: Diagnosis not present

## 2019-02-07 DIAGNOSIS — R21 Rash and other nonspecific skin eruption: Secondary | ICD-10-CM | POA: Diagnosis not present

## 2019-02-07 DIAGNOSIS — I10 Essential (primary) hypertension: Secondary | ICD-10-CM | POA: Diagnosis not present

## 2019-02-11 DIAGNOSIS — R509 Fever, unspecified: Secondary | ICD-10-CM | POA: Diagnosis not present

## 2019-02-13 DIAGNOSIS — J3089 Other allergic rhinitis: Secondary | ICD-10-CM | POA: Diagnosis not present

## 2019-02-13 DIAGNOSIS — R21 Rash and other nonspecific skin eruption: Secondary | ICD-10-CM | POA: Diagnosis not present

## 2019-02-13 DIAGNOSIS — R220 Localized swelling, mass and lump, head: Secondary | ICD-10-CM | POA: Diagnosis not present

## 2019-02-17 ENCOUNTER — Other Ambulatory Visit: Payer: Self-pay | Admitting: Geriatric Medicine

## 2019-02-17 ENCOUNTER — Ambulatory Visit
Admission: RE | Admit: 2019-02-17 | Discharge: 2019-02-17 | Disposition: A | Discharge status: Home / Self Care | Payer: Medicare Other | Source: Ambulatory Visit | Attending: Geriatric Medicine | Admitting: Geriatric Medicine

## 2019-02-17 DIAGNOSIS — I10 Essential (primary) hypertension: Secondary | ICD-10-CM | POA: Diagnosis not present

## 2019-02-17 DIAGNOSIS — R05 Cough: Secondary | ICD-10-CM

## 2019-02-17 DIAGNOSIS — L308 Other specified dermatitis: Secondary | ICD-10-CM | POA: Diagnosis not present

## 2019-02-17 DIAGNOSIS — R509 Fever, unspecified: Secondary | ICD-10-CM

## 2019-02-17 DIAGNOSIS — L282 Other prurigo: Secondary | ICD-10-CM | POA: Diagnosis not present

## 2019-02-17 DIAGNOSIS — H9202 Otalgia, left ear: Secondary | ICD-10-CM | POA: Diagnosis not present

## 2019-02-17 DIAGNOSIS — R059 Cough, unspecified: Secondary | ICD-10-CM

## 2019-02-17 DIAGNOSIS — T7840XD Allergy, unspecified, subsequent encounter: Secondary | ICD-10-CM | POA: Diagnosis not present

## 2019-02-17 DIAGNOSIS — R0602 Shortness of breath: Secondary | ICD-10-CM | POA: Diagnosis not present

## 2019-02-17 DIAGNOSIS — L309 Dermatitis, unspecified: Secondary | ICD-10-CM | POA: Diagnosis not present

## 2019-02-18 ENCOUNTER — Ambulatory Visit: Payer: Medicare Other | Admitting: Podiatry

## 2019-02-19 ENCOUNTER — Other Ambulatory Visit: Payer: Self-pay | Admitting: Geriatric Medicine

## 2019-02-19 DIAGNOSIS — Q321 Other congenital malformations of trachea: Secondary | ICD-10-CM

## 2019-02-19 DIAGNOSIS — R911 Solitary pulmonary nodule: Secondary | ICD-10-CM

## 2019-02-20 ENCOUNTER — Other Ambulatory Visit: Payer: Self-pay | Admitting: Geriatric Medicine

## 2019-02-20 DIAGNOSIS — I2699 Other pulmonary embolism without acute cor pulmonale: Secondary | ICD-10-CM

## 2019-02-20 DIAGNOSIS — Q321 Other congenital malformations of trachea: Secondary | ICD-10-CM

## 2019-02-20 DIAGNOSIS — I82409 Acute embolism and thrombosis of unspecified deep veins of unspecified lower extremity: Secondary | ICD-10-CM

## 2019-02-20 DIAGNOSIS — Z86718 Personal history of other venous thrombosis and embolism: Secondary | ICD-10-CM | POA: Insufficient documentation

## 2019-02-20 DIAGNOSIS — R911 Solitary pulmonary nodule: Secondary | ICD-10-CM

## 2019-02-20 HISTORY — DX: Other pulmonary embolism without acute cor pulmonale: I26.99

## 2019-02-20 HISTORY — DX: Acute embolism and thrombosis of unspecified deep veins of unspecified lower extremity: I82.409

## 2019-02-21 ENCOUNTER — Other Ambulatory Visit: Payer: Self-pay | Admitting: Geriatric Medicine

## 2019-02-21 DIAGNOSIS — L309 Dermatitis, unspecified: Secondary | ICD-10-CM | POA: Diagnosis not present

## 2019-02-21 DIAGNOSIS — R911 Solitary pulmonary nodule: Secondary | ICD-10-CM

## 2019-02-21 DIAGNOSIS — L270 Generalized skin eruption due to drugs and medicaments taken internally: Secondary | ICD-10-CM | POA: Diagnosis not present

## 2019-02-21 DIAGNOSIS — R21 Rash and other nonspecific skin eruption: Secondary | ICD-10-CM | POA: Diagnosis not present

## 2019-02-21 DIAGNOSIS — Q321 Other congenital malformations of trachea: Secondary | ICD-10-CM

## 2019-02-26 ENCOUNTER — Other Ambulatory Visit: Payer: Self-pay | Admitting: Geriatric Medicine

## 2019-02-26 DIAGNOSIS — R140 Abdominal distension (gaseous): Secondary | ICD-10-CM

## 2019-02-26 DIAGNOSIS — Z0000 Encounter for general adult medical examination without abnormal findings: Secondary | ICD-10-CM | POA: Diagnosis not present

## 2019-02-26 DIAGNOSIS — F5101 Primary insomnia: Secondary | ICD-10-CM | POA: Diagnosis not present

## 2019-02-26 DIAGNOSIS — I10 Essential (primary) hypertension: Secondary | ICD-10-CM | POA: Diagnosis not present

## 2019-02-26 DIAGNOSIS — Z1389 Encounter for screening for other disorder: Secondary | ICD-10-CM | POA: Diagnosis not present

## 2019-02-26 DIAGNOSIS — Z23 Encounter for immunization: Secondary | ICD-10-CM | POA: Diagnosis not present

## 2019-02-26 DIAGNOSIS — R21 Rash and other nonspecific skin eruption: Secondary | ICD-10-CM | POA: Diagnosis not present

## 2019-02-26 DIAGNOSIS — R269 Unspecified abnormalities of gait and mobility: Secondary | ICD-10-CM | POA: Diagnosis not present

## 2019-02-27 ENCOUNTER — Ambulatory Visit
Admission: RE | Admit: 2019-02-27 | Discharge: 2019-02-27 | Disposition: A | Discharge status: Home / Self Care | Payer: Medicare Other | Source: Ambulatory Visit | Attending: Geriatric Medicine | Admitting: Geriatric Medicine

## 2019-02-27 DIAGNOSIS — R590 Localized enlarged lymph nodes: Secondary | ICD-10-CM | POA: Diagnosis not present

## 2019-02-27 DIAGNOSIS — J90 Pleural effusion, not elsewhere classified: Secondary | ICD-10-CM | POA: Diagnosis not present

## 2019-02-27 DIAGNOSIS — R911 Solitary pulmonary nodule: Secondary | ICD-10-CM | POA: Diagnosis not present

## 2019-02-27 DIAGNOSIS — J392 Other diseases of pharynx: Secondary | ICD-10-CM | POA: Diagnosis not present

## 2019-02-27 DIAGNOSIS — E049 Nontoxic goiter, unspecified: Secondary | ICD-10-CM | POA: Diagnosis not present

## 2019-02-27 DIAGNOSIS — Q321 Other congenital malformations of trachea: Secondary | ICD-10-CM

## 2019-02-27 DIAGNOSIS — K869 Disease of pancreas, unspecified: Secondary | ICD-10-CM | POA: Diagnosis not present

## 2019-02-27 DIAGNOSIS — J8410 Pulmonary fibrosis, unspecified: Secondary | ICD-10-CM | POA: Diagnosis not present

## 2019-02-27 DIAGNOSIS — R140 Abdominal distension (gaseous): Secondary | ICD-10-CM

## 2019-02-27 MED ORDER — IOPAMIDOL (ISOVUE-300) INJECTION 61%
100.0000 mL | Freq: Once | INTRAVENOUS | Status: AC | PRN
  Administered 2019-02-27: 100 mL via INTRAVENOUS

## 2019-03-03 DIAGNOSIS — R05 Cough: Secondary | ICD-10-CM | POA: Diagnosis not present

## 2019-03-03 DIAGNOSIS — J90 Pleural effusion, not elsewhere classified: Secondary | ICD-10-CM | POA: Diagnosis not present

## 2019-03-03 DIAGNOSIS — R21 Rash and other nonspecific skin eruption: Secondary | ICD-10-CM | POA: Diagnosis not present

## 2019-03-03 DIAGNOSIS — L509 Urticaria, unspecified: Secondary | ICD-10-CM | POA: Diagnosis not present

## 2019-03-04 DIAGNOSIS — I10 Essential (primary) hypertension: Secondary | ICD-10-CM | POA: Diagnosis not present

## 2019-03-04 DIAGNOSIS — R011 Cardiac murmur, unspecified: Secondary | ICD-10-CM | POA: Diagnosis not present

## 2019-03-04 DIAGNOSIS — R600 Localized edema: Secondary | ICD-10-CM | POA: Diagnosis not present

## 2019-03-04 DIAGNOSIS — R110 Nausea: Secondary | ICD-10-CM | POA: Diagnosis not present

## 2019-03-05 ENCOUNTER — Other Ambulatory Visit: Payer: Self-pay | Admitting: Otolaryngology

## 2019-03-05 DIAGNOSIS — R590 Localized enlarged lymph nodes: Secondary | ICD-10-CM | POA: Diagnosis not present

## 2019-03-05 DIAGNOSIS — E039 Hypothyroidism, unspecified: Secondary | ICD-10-CM | POA: Diagnosis not present

## 2019-03-06 ENCOUNTER — Ambulatory Visit: Payer: Medicare Other | Admitting: Allergy

## 2019-03-06 ENCOUNTER — Emergency Department (HOSPITAL_COMMUNITY): Payer: Medicare Other

## 2019-03-06 ENCOUNTER — Encounter (HOSPITAL_COMMUNITY): Payer: Self-pay | Admitting: Internal Medicine

## 2019-03-06 ENCOUNTER — Inpatient Hospital Stay (HOSPITAL_COMMUNITY): Payer: Medicare Other

## 2019-03-06 ENCOUNTER — Inpatient Hospital Stay (HOSPITAL_COMMUNITY)
Admission: EM | Admit: 2019-03-06 | Discharge: 2019-03-08 | DRG: 176 | Disposition: A | Discharge status: Home / Self Care | Payer: Medicare Other | Attending: Student | Admitting: Student

## 2019-03-06 DIAGNOSIS — E785 Hyperlipidemia, unspecified: Secondary | ICD-10-CM | POA: Diagnosis present

## 2019-03-06 DIAGNOSIS — K862 Cyst of pancreas: Secondary | ICD-10-CM | POA: Diagnosis present

## 2019-03-06 DIAGNOSIS — Z853 Personal history of malignant neoplasm of breast: Secondary | ICD-10-CM

## 2019-03-06 DIAGNOSIS — R778 Other specified abnormalities of plasma proteins: Secondary | ICD-10-CM | POA: Diagnosis not present

## 2019-03-06 DIAGNOSIS — R000 Tachycardia, unspecified: Secondary | ICD-10-CM | POA: Diagnosis not present

## 2019-03-06 DIAGNOSIS — Z809 Family history of malignant neoplasm, unspecified: Secondary | ICD-10-CM

## 2019-03-06 DIAGNOSIS — E039 Hypothyroidism, unspecified: Secondary | ICD-10-CM | POA: Diagnosis not present

## 2019-03-06 DIAGNOSIS — R21 Rash and other nonspecific skin eruption: Secondary | ICD-10-CM | POA: Diagnosis not present

## 2019-03-06 DIAGNOSIS — I4891 Unspecified atrial fibrillation: Secondary | ICD-10-CM | POA: Diagnosis not present

## 2019-03-06 DIAGNOSIS — R161 Splenomegaly, not elsewhere classified: Secondary | ICD-10-CM | POA: Diagnosis present

## 2019-03-06 DIAGNOSIS — R911 Solitary pulmonary nodule: Secondary | ICD-10-CM | POA: Diagnosis present

## 2019-03-06 DIAGNOSIS — R188 Other ascites: Secondary | ICD-10-CM | POA: Diagnosis present

## 2019-03-06 DIAGNOSIS — J90 Pleural effusion, not elsewhere classified: Secondary | ICD-10-CM | POA: Diagnosis not present

## 2019-03-06 DIAGNOSIS — Z79899 Other long term (current) drug therapy: Secondary | ICD-10-CM

## 2019-03-06 DIAGNOSIS — Z66 Do not resuscitate: Secondary | ICD-10-CM | POA: Diagnosis present

## 2019-03-06 DIAGNOSIS — I248 Other forms of acute ischemic heart disease: Secondary | ICD-10-CM | POA: Diagnosis present

## 2019-03-06 DIAGNOSIS — H409 Unspecified glaucoma: Secondary | ICD-10-CM | POA: Diagnosis present

## 2019-03-06 DIAGNOSIS — R0902 Hypoxemia: Secondary | ICD-10-CM | POA: Diagnosis not present

## 2019-03-06 DIAGNOSIS — M7989 Other specified soft tissue disorders: Secondary | ICD-10-CM | POA: Diagnosis present

## 2019-03-06 DIAGNOSIS — I2699 Other pulmonary embolism without acute cor pulmonale: Secondary | ICD-10-CM | POA: Diagnosis present

## 2019-03-06 DIAGNOSIS — I10 Essential (primary) hypertension: Secondary | ICD-10-CM | POA: Diagnosis present

## 2019-03-06 DIAGNOSIS — I371 Nonrheumatic pulmonary valve insufficiency: Secondary | ICD-10-CM

## 2019-03-06 DIAGNOSIS — L299 Pruritus, unspecified: Secondary | ICD-10-CM | POA: Diagnosis present

## 2019-03-06 DIAGNOSIS — R591 Generalized enlarged lymph nodes: Secondary | ICD-10-CM | POA: Diagnosis present

## 2019-03-06 DIAGNOSIS — L282 Other prurigo: Secondary | ICD-10-CM | POA: Diagnosis present

## 2019-03-06 DIAGNOSIS — R7989 Other specified abnormal findings of blood chemistry: Secondary | ICD-10-CM

## 2019-03-06 DIAGNOSIS — I2694 Multiple subsegmental pulmonary emboli without acute cor pulmonale: Secondary | ICD-10-CM

## 2019-03-06 DIAGNOSIS — D696 Thrombocytopenia, unspecified: Secondary | ICD-10-CM | POA: Diagnosis present

## 2019-03-06 DIAGNOSIS — I361 Nonrheumatic tricuspid (valve) insufficiency: Secondary | ICD-10-CM

## 2019-03-06 DIAGNOSIS — F419 Anxiety disorder, unspecified: Secondary | ICD-10-CM | POA: Diagnosis present

## 2019-03-06 DIAGNOSIS — Z20828 Contact with and (suspected) exposure to other viral communicable diseases: Secondary | ICD-10-CM | POA: Diagnosis present

## 2019-03-06 DIAGNOSIS — R0602 Shortness of breath: Secondary | ICD-10-CM

## 2019-03-06 DIAGNOSIS — E876 Hypokalemia: Secondary | ICD-10-CM | POA: Diagnosis present

## 2019-03-06 DIAGNOSIS — M1990 Unspecified osteoarthritis, unspecified site: Secondary | ICD-10-CM | POA: Diagnosis present

## 2019-03-06 DIAGNOSIS — Z885 Allergy status to narcotic agent status: Secondary | ICD-10-CM | POA: Diagnosis not present

## 2019-03-06 DIAGNOSIS — R05 Cough: Secondary | ICD-10-CM | POA: Diagnosis not present

## 2019-03-06 DIAGNOSIS — I2609 Other pulmonary embolism with acute cor pulmonale: Secondary | ICD-10-CM | POA: Diagnosis not present

## 2019-03-06 DIAGNOSIS — J80 Acute respiratory distress syndrome: Secondary | ICD-10-CM | POA: Diagnosis not present

## 2019-03-06 DIAGNOSIS — J948 Other specified pleural conditions: Secondary | ICD-10-CM | POA: Diagnosis not present

## 2019-03-06 DIAGNOSIS — Z79890 Hormone replacement therapy (postmenopausal): Secondary | ICD-10-CM

## 2019-03-06 DIAGNOSIS — H269 Unspecified cataract: Secondary | ICD-10-CM | POA: Diagnosis present

## 2019-03-06 DIAGNOSIS — Z8249 Family history of ischemic heart disease and other diseases of the circulatory system: Secondary | ICD-10-CM

## 2019-03-06 DIAGNOSIS — Z9981 Dependence on supplemental oxygen: Secondary | ICD-10-CM

## 2019-03-06 DIAGNOSIS — H9190 Unspecified hearing loss, unspecified ear: Secondary | ICD-10-CM | POA: Diagnosis present

## 2019-03-06 DIAGNOSIS — Z96651 Presence of right artificial knee joint: Secondary | ICD-10-CM | POA: Diagnosis present

## 2019-03-06 LAB — CBC WITH DIFFERENTIAL/PLATELET
Abs Immature Granulocytes: 0.08 10*3/uL — ABNORMAL HIGH (ref 0.00–0.07)
Basophils Absolute: 0 10*3/uL (ref 0.0–0.1)
Basophils Relative: 1 %
Eosinophils Absolute: 0.6 10*3/uL — ABNORMAL HIGH (ref 0.0–0.5)
Eosinophils Relative: 7 %
HCT: 45.3 % (ref 36.0–46.0)
Hemoglobin: 14 g/dL (ref 12.0–15.0)
Immature Granulocytes: 1 %
Lymphocytes Relative: 9 %
Lymphs Abs: 0.7 10*3/uL (ref 0.7–4.0)
MCH: 28.5 pg (ref 26.0–34.0)
MCHC: 30.9 g/dL (ref 30.0–36.0)
MCV: 92.3 fL (ref 80.0–100.0)
Monocytes Absolute: 0.7 10*3/uL (ref 0.1–1.0)
Monocytes Relative: 9 %
Neutro Abs: 6.2 10*3/uL (ref 1.7–7.7)
Neutrophils Relative %: 73 %
Platelets: 72 10*3/uL — ABNORMAL LOW (ref 150–400)
RBC: 4.91 MIL/uL (ref 3.87–5.11)
RDW: 14.4 % (ref 11.5–15.5)
WBC: 8.3 10*3/uL (ref 4.0–10.5)
nRBC: 0 % (ref 0.0–0.2)

## 2019-03-06 LAB — PROTEIN, PLEURAL OR PERITONEAL FLUID: Total protein, fluid: 3.5 g/dL

## 2019-03-06 LAB — HEPARIN LEVEL (UNFRACTIONATED): Heparin Unfractionated: 0.1 IU/mL — ABNORMAL LOW (ref 0.30–0.70)

## 2019-03-06 LAB — ECHOCARDIOGRAM COMPLETE: Weight: 2000.01 [oz_av]

## 2019-03-06 LAB — COMPREHENSIVE METABOLIC PANEL
ALT: 11 U/L (ref 0–44)
AST: 21 U/L (ref 15–41)
Albumin: 3.4 g/dL — ABNORMAL LOW (ref 3.5–5.0)
Alkaline Phosphatase: 85 U/L (ref 38–126)
Anion gap: 15 (ref 5–15)
BUN: 19 mg/dL (ref 8–23)
CO2: 19 mmol/L — ABNORMAL LOW (ref 22–32)
Calcium: 9.3 mg/dL (ref 8.9–10.3)
Chloride: 105 mmol/L (ref 98–111)
Creatinine, Ser: 0.79 mg/dL (ref 0.44–1.00)
GFR calc Af Amer: >60 mL/min (ref >60)
GFR calc non Af Amer: >60 mL/min (ref >60)
Glucose, Bld: 113 mg/dL — ABNORMAL HIGH (ref 70–99)
Potassium: 3.5 mmol/L (ref 3.5–5.1)
Sodium: 139 mmol/L (ref 135–145)
Total Bilirubin: 0.4 mg/dL (ref 0.3–1.2)
Total Protein: 6 g/dL — ABNORMAL LOW (ref 6.5–8.1)

## 2019-03-06 LAB — BODY FLUID CELL COUNT WITH DIFFERENTIAL
Eos, Fluid: 1 %
Lymphs, Fluid: 44 %
Monocyte-Macrophage-Serous Fluid: 4 % — ABNORMAL LOW (ref 50–90)
Neutrophil Count, Fluid: 51 % — ABNORMAL HIGH (ref 0–25)
Total Nucleated Cell Count, Fluid: 825 uL (ref 0–1000)

## 2019-03-06 LAB — TROPONIN I (HIGH SENSITIVITY)
Troponin I (High Sensitivity): 67 ng/L — ABNORMAL HIGH (ref <18)
Troponin I (High Sensitivity): 352 ng/L (ref <18)
Troponin I (High Sensitivity): 321 ng/L
Troponin I (High Sensitivity): 207 ng/L (ref <18)

## 2019-03-06 LAB — LACTATE DEHYDROGENASE, PLEURAL OR PERITONEAL FLUID: LD, Fluid: 107 U/L — ABNORMAL HIGH (ref 3–23)

## 2019-03-06 LAB — TSH: TSH: 1.531 u[IU]/mL (ref 0.350–4.500)

## 2019-03-06 LAB — GLUCOSE, PLEURAL OR PERITONEAL FLUID: Glucose, Fluid: 107 mg/dL

## 2019-03-06 LAB — SARS CORONAVIRUS 2 (TAT 6-24 HRS): SARS Coronavirus 2: NEGATIVE

## 2019-03-06 MED ORDER — LORATADINE 10 MG PO TABS
10.0000 mg | ORAL_TABLET | Freq: Every day | ORAL | Status: DC
  Administered 2019-03-07 – 2019-03-08 (×2): 10 mg via ORAL
  Filled 2019-03-06 (×3): qty 1

## 2019-03-06 MED ORDER — DOCUSATE SODIUM 100 MG PO CAPS
100.0000 mg | ORAL_CAPSULE | Freq: Two times a day (BID) | ORAL | Status: DC
  Administered 2019-03-06 – 2019-03-08 (×4): 100 mg via ORAL
  Filled 2019-03-06 (×4): qty 1

## 2019-03-06 MED ORDER — ACETAMINOPHEN 650 MG RE SUPP: 650.0000 mg | Freq: Four times a day (QID) | RECTAL | Status: DC | PRN

## 2019-03-06 MED ORDER — HEPARIN BOLUS VIA INFUSION
2000.0000 [IU] | Freq: Once | INTRAVENOUS | Status: AC
  Administered 2019-03-06: 2000 [IU] via INTRAVENOUS
  Filled 2019-03-06: qty 2000

## 2019-03-06 MED ORDER — AMLODIPINE BESYLATE 5 MG PO TABS
5.0000 mg | ORAL_TABLET | Freq: Every morning | ORAL | Status: DC
  Administered 2019-03-06 – 2019-03-08 (×3): 5 mg via ORAL
  Filled 2019-03-06 (×3): qty 1

## 2019-03-06 MED ORDER — AMLODIPINE BESYLATE 2.5 MG PO TABS
2.5000 mg | ORAL_TABLET | Freq: Every evening | ORAL | Status: DC
  Administered 2019-03-06 – 2019-03-07 (×2): 2.5 mg via ORAL
  Filled 2019-03-06 (×2): qty 1

## 2019-03-06 MED ORDER — ACETAMINOPHEN 325 MG PO TABS
650.0000 mg | ORAL_TABLET | Freq: Once | ORAL | Status: AC
  Administered 2019-03-06: 650 mg via ORAL
  Filled 2019-03-06: qty 2

## 2019-03-06 MED ORDER — IOHEXOL 350 MG/ML SOLN
100.0000 mL | Freq: Once | INTRAVENOUS | Status: AC | PRN
  Administered 2019-03-06: 100 mL via INTRAVENOUS

## 2019-03-06 MED ORDER — HEPARIN BOLUS VIA INFUSION
3000.0000 [IU] | Freq: Once | INTRAVENOUS | Status: AC
  Administered 2019-03-06: 3000 [IU] via INTRAVENOUS
  Filled 2019-03-06: qty 3000

## 2019-03-06 MED ORDER — OLOPATADINE HCL 0.1 % OP SOLN
1.0000 [drp] | Freq: Two times a day (BID) | OPHTHALMIC | Status: DC
  Administered 2019-03-07 – 2019-03-08 (×2): 1 [drp] via OPHTHALMIC
  Filled 2019-03-06 (×2): qty 5

## 2019-03-06 MED ORDER — ACETAMINOPHEN 325 MG PO TABS: 650.0000 mg | ORAL_TABLET | Freq: Four times a day (QID) | ORAL | Status: DC | PRN

## 2019-03-06 MED ORDER — AMLODIPINE BESYLATE 5 MG PO TABS: 2.5000 mg | ORAL_TABLET | ORAL | Status: DC

## 2019-03-06 MED ORDER — HEPARIN (PORCINE) 25000 UT/250ML-% IV SOLN
1250.0000 [IU]/h | INTRAVENOUS | Status: DC
  Administered 2019-03-06: 950 [IU]/h via INTRAVENOUS
  Administered 2019-03-07: 1150 [IU]/h via INTRAVENOUS
  Filled 2019-03-06 (×2): qty 250

## 2019-03-06 MED ORDER — ONDANSETRON HCL 4 MG PO TABS: 4.0000 mg | ORAL_TABLET | Freq: Four times a day (QID) | ORAL | Status: DC | PRN

## 2019-03-06 MED ORDER — ONDANSETRON HCL 4 MG/2ML IJ SOLN: 4.0000 mg | Freq: Four times a day (QID) | INTRAMUSCULAR | Status: DC | PRN

## 2019-03-06 MED ORDER — LEVOTHYROXINE SODIUM 25 MCG PO TABS
25.0000 ug | ORAL_TABLET | Freq: Every day | ORAL | Status: DC
  Administered 2019-03-07 – 2019-03-08 (×2): 25 ug via ORAL
  Filled 2019-03-06 (×2): qty 1

## 2019-03-06 MED ORDER — ZOLPIDEM TARTRATE 5 MG PO TABS
2.5000 mg | ORAL_TABLET | Freq: Every day | ORAL | Status: DC
  Administered 2019-03-06 – 2019-03-07 (×2): 2.5 mg via ORAL
  Filled 2019-03-06 (×2): qty 1

## 2019-03-06 NOTE — Plan of Care (Signed)
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  When your body in inflamed you have a tendency to create blood clots which travel to your heart.  This is what caused your palpitations.  The cough and fluid around the lung I also suspect is related to the lymph node cancer.  Usually if you have lymph node cancer like this you get the lymph node taken out and examined.  However, since you now have a blood clot this will need to be delayed.  Because the fluid around your lung is involved in same process and has lower risk of bleeding if sampled, we are offering to drain your right lung.  This has two benefits: (1) allows [place] to diagnose what type of cancer this is so we can work on figuring out how to treat it and (2) help your coughing issues.  The risks of this procedure include bleeding and getting air outside the lungs, if either of these happen I may have to leave a tube in your chest to drain out the air or blood.  This has a low chance of happening but not zero.  If the bleeding is very bad we sometimes need to go into the artery to block off the source from the inside.  Erskine Emery MD Pulmonology (Lung doctor)

## 2019-03-06 NOTE — H&P (Addendum)
History and Physical    Sarah [name] LKG:[id number] DOB: 01-25-1933 DOA: 03/06/2019  PCP: Lajean Manes, MD Consultants:  Blenda Nicely - ENT; Maudie Mercury - allergy; Regal - podiatry Patient coming from:  Home - lives with daughter (staying with her temporarily); NOK: [place], [phone]  Chief Complaint: SOB  HPI: Sarah [name] is a 85 y.o. female with medical history significant of hypothyroidism; HTN; HLD; and remote breast CA (2013) presenting with SOB.  She was in the kitchen last night and looking at the mail.  She picked it up and her heart started racing, beating real fast.  She thought maybe she was having a heart attack and they called 911.  +SOB.  No cough.  She felt fine until that moment.  She has a lot of swelling in her legs and her L hand is swollen.  She is scheduled for a biopsy next week.  She also has hives all over her body.  She also has a horrible cough that occurs mostly at night.  No fever.  No night sweats.  She is not eating but has actually gained 10 pounds despite anorexia.  I called and spoke with her daughter, Sarah [name].  "Supposedly she has lymphoma".  She had a rash and terrible cough - xray abnormal, CT ordered.  Also with abdominal swelling and eats minimally.  Numerous abnormal lymph nodes, concern for lymphoma.  She has a procedure scheduled for lymph node resection.  Her daughter is upset because they took her to the ER at [facility] on 10/12 and requested admission but she was discharged.  She was diagnosed with rash and cough and referred for outpatient f/u.  CXR was stable and prior CTs were available for review at that time.  Daughter also notes h/o rectal bleeding for about a year.    ED Course:  Acute SOB with palpitations, tachycardia.  On 2L for comfort, elevated troponin with increase in delta troponin without chest pain or EKG changes.  Submassive PE, hemodynamically stable on Heparin.  Had a recent scan, concern for lymphoma and due for biopsy next week to  confirm diagnosis.  PCCM will be notified of admission.  Review of Systems: As per HPI; otherwise review of systems reviewed and negative.   Ambulatory Status:  Ambulates without assistance  Past Medical History:  Diagnosis Date   Allergy    codeine, thiazides   Anxiety    new dx   Arthritis    Breast cancer ([place]) 03/14/12   bx=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,ER/PR=+,upper inner quad   Cancer (HCC)    breast   Cataract    Glaucoma    laser treated years ago   HOH (hard of hearing)    Hyperlipemia    Hypertension    Hypothyroidism    Pancreatic cyst    benign   PONV (postoperative nausea and vomiting)    Radiation 06/11/2012-07/12/2012   17 sessions 4250 cGy, 3 sessions 750 cGy   Vertigo    Wears glasses    Wears partial dentures    partial upper    Past Surgical History:  Procedure Laterality Date   ABDOMINAL HYSTERECTOMY  1966   1/2 ovary left in    [place]   lumpectomy-lt   CATARACT EXTRACTION     b/l   COLONOSCOPY     EYE SURGERY     bilateral cataract removal   JOINT REPLACEMENT  2013   rt total knee   JOINT REPLACEMENT  1995  lt total knee   PARTIAL MASTECTOMY WITH NEEDLE LOCALIZATION  04/09/2012   Procedure: PARTIAL MASTECTOMY WITH NEEDLE LOCALIZATION;  Surgeon: Adin Hector, MD;  Location: [place];  Service: General;  Laterality: Right;   TONSILLECTOMY     TOTAL KNEE ARTHROPLASTY  08/16/2011   Procedure: TOTAL KNEE ARTHROPLASTY;  Surgeon: Ninetta Lights, MD;  Location: [place];  Service: Orthopedics;  Laterality: Right;    Social History   Socioeconomic History   Marital status: Widowed    Spouse name: Not on file   Number of children: 2   Years of education: Not on file   Highest education level: Not on file  Occupational History   Occupation: Retired    Comment: Interior and spatial designer strain: Not on file   Food  insecurity    Worry: Not on file    Inability: Not on Lexicographer needs    Medical: Not on file    Non-medical: Not on file  Tobacco Use   Smoking status: Never Smoker   Smokeless tobacco: Never Used  Substance and Sexual Activity   Alcohol use: No   Drug use: No   Sexual activity: Never  Lifestyle   Physical activity    Days per week: Not on file    Minutes per session: Not on file   Stress: Not on file  Relationships   Social connections    Talks on phone: Not on file    Gets together: Not on file    Attends religious service: Not on file    Active member of club or organization: Not on file    Attends meetings of clubs or organizations: Not on file    Relationship status: Not on file   Intimate partner violence    Fear of current or ex partner: Not on file    Emotionally abused: Not on file    Physically abused: Not on file    Forced sexual activity: Not on file  Other Topics Concern   Not on file  Social History Narrative   Not on file    Allergies  Allergen Reactions   Codeine Other (See Comments)    Unknown reaction   Thiazide-Type Diuretics Other (See Comments)    Blurry vision    Family History  Problem Relation Age of Onset   Heart disease Father    Cancer Maternal Aunt        stomach    Prior to Admission medications   Medication Sig Start Date End Date Taking? Authorizing Provider  amLODipine (NORVASC) 2.5 MG tablet Take 2.5-5 mg by mouth See admin instructions. 5 mg in the morning, 2.5 mg in the evening 12/21/18   [provider]  Calcium Carbonate (CALCIUM 600 PO) Take 600 mg by mouth daily.    [provider]  cetirizine (ZYRTEC) 10 MG tablet Take 10 mg by mouth 2 (two) times daily.  01/23/19   [provider]  Cholecalciferol (VITAMIN D) 50 MCG (2000 UT) tablet Take 2,000 Units by mouth 2 (two) times daily.    [provider]  Cyanocobalamin (VITAMIN B-12 PO) Take 3,000 mcg by mouth  daily.     [provider]  hydrOXYzine (ATARAX/VISTARIL) 10 MG tablet Take 1 tablet (10 mg total) by mouth at bedtime as needed for itching. Patient not taking: Reported on 03/05/2019 02/06/19   Garnet Sierras, DO  Krill Oil (OMEGA-3) 500 MG CAPS Take 500  mg by mouth daily.    [provider]  levothyroxine (SYNTHROID) 25 MCG tablet Take 25 mcg by mouth daily before breakfast.  09/20/18   [provider]  Multiple Vitamins-Minerals (PRESERVISION AREDS PO) Take 1 capsule by mouth 2 (two) times daily.     [provider]  olopatadine (PATANOL) 0.1 % ophthalmic solution Place 1 drop into both eyes 2 (two) times daily.    [provider]  triamcinolone cream (KENALOG) 0.1 % Apply 1 application topically 2 (two) times daily. 02/26/19   [provider]  vitamin E 400 UNIT capsule Take 400 Units by mouth daily.    [provider]  zolpidem (AMBIEN) 10 MG tablet Take 2.5 mg by mouth at bedtime.  07/24/14   [provider]    Physical Exam: Vitals:   03/06/19 1115 03/06/19 1130 03/06/19 1145 03/06/19 1215  BP: 124/77 126/82 116/78 115/72  Pulse: 91 99 97 94  Resp: (!) 22 20 16  (!) 24  Temp:      TempSrc:      SpO2: 96% 97% 97% 98%  Weight:          General:  Appears calm and comfortable and is NAD, mildly confused regarding diagnoses and treatment  Eyes:  PERRL, EOMI, normal lids, iris  ENT:  grossly normal hearing, lips & tongue, mmm  Neck:  no LAD, masses or thyromegaly  Cardiovascular:  RR with mild tachycardia, no m/r/g. B 1-2+ LE edema.   Respiratory:   CTA bilaterally with diminished breath sounds in the bilateral bases.  Normal respiratory effort.  Abdomen:  soft, NT, ND, NABS  Back:   normal alignment, no CVAT  Skin:  Diffuse maculopapular skin rash particularly noticeable on B thighs and back        Musculoskeletal:  grossly normal tone BUE/BLE, good ROM, no bony abnormality; LE edema from upper arm  distally  Lower extremity:   Limited foot exam with no ulcerations.  2+ distal pulses.  Negative Homan's or squeeze, B relatively symmetric edema of LE.  Psychiatric:  grossly normal mood and affect, speech fluent and appropriate, AOx3  Neurologic:  CN 2-12 grossly intact, moves all extremities in coordinated fashion, sensation intact    Radiological Exams on Admission: Ct Angio Chest Pe W And/or Wo Contrast  Addendum Date: 03/06/2019   ADDENDUM REPORT: 03/06/2019 09:17 ADDENDUM: Comment: No dominant thyroid lesions. Electronically Signed   By: Lowella Grip III M.D.   On: 03/06/2019 09:17   Result Date: 03/06/2019 CLINICAL DATA:  Shortness of breath.  History of breast carcinoma EXAM: CT ANGIOGRAPHY CHEST WITH CONTRAST TECHNIQUE: Multidetector CT imaging of the chest was performed using the standard protocol during bolus administration of intravenous contrast. Multiplanar CT image reconstructions and MIPs were obtained to evaluate the vascular anatomy. CONTRAST:  142mL OMNIPAQUE IOHEXOL 350 MG/ML SOLN COMPARISON:  February 27, 2019 CT chest, abdomen, and pelvis; chest CT September 06, 2011 FINDINGS: Cardiovascular: There are pulmonary emboli arising from the distal most aspect of the left main pulmonary artery with extension primarily into left lower lobe pulmonary arterial branches. There is incompletely obstructing pulmonary embolus as well in the left upper lobe pulmonary artery proximally. There is more peripheral pulmonary embolus in the right lower lobe region. The right ventricle to left ventricle diameter ratio is 1.6, indicative of right heart strain. There is no appreciable thoracic aortic aneurysm or dissection. The visualized great vessels show patchy areas of atherosclerotic calcification. There is extensive aortic atherosclerosis as well  as scattered foci of coronary artery calcification. There is no pericardial effusion or pericardial thickening. Mediastinum/Nodes: There are multiple  lymph nodes throughout the axillary and subpectoral regions. Most of these lymph nodes are subcentimeter. Several lymph nodes are borderline prominent, unchanged from 1 week prior. There are several prominent lymph nodes surrounding the mid to distal trachea, largest measuring 1.2 x 1.1 cm. There is no new adenopathy compared to 1 week prior. No esophageal lesions are evident. Lungs/Pleura: There is a large pleural effusion on the right with a much smaller pleural effusion on the left. There is consolidation, largely due to compressive atelectasis throughout the right lower lobe. There is milder compressive atelectasis in the left base. There is a nodular opacity abutting the pleura in the anterior segment of the right upper lobe measuring 1.1 x 0.6 cm. A nodular opacity abutting the pleura in the right middle lobe measures 7 mm, better seen on recent prior study. Upper Abdomen: In the visualized upper abdomen, there is aortic atherosclerosis. Spleen is incompletely visualized but appears enlarged. Spleen is noted to be enlarged on recent CT abdomen study. There appears to be adenopathy in the upper retroperitoneum, incompletely visualized. Musculoskeletal: There is degenerative change in the thoracic spine. No blastic or lytic bone lesions are evident. A bone island is noted in the T4 vertebral body, stable. Postoperative change in the right breast region noted. Review of the MIP images confirms the above findings. IMPRESSION: 1. Pulmonary embolus arising from the distal left main pulmonary artery with extension into multiple left lower lobe pulmonary arterial branches. There is also incompletely obstructing pulmonary embolus in the proximal left upper lobe pulmonary artery. More distal pulmonary embolus in the right lower lobe pulmonary artery. Positive for acute PE with CT evidence of right heart strain (RV/LV Ratio = 1.6) consistent with at least submassive (intermediate risk) PE. The presence of right heart  strain has been associated with an increased risk of morbidity and mortality. Please activate Code PE by paging [phone]. 2. Sizable pleural effusion on the right with smaller pleural effusion on the left. Consolidation and compressive atelectasis throughout most of the right lower lobe. Milder atelectasis left base. Nodular opacities on the right, likely metastatic foci. 3.  Stable adenopathy compared to 1 week prior. 4. Enlargement of spleen, incompletely visualized but documented CT 1 week prior. Concern for potential lymphoma. 5. Aortic atherosclerosis. No aneurysm or dissection evident. Foci of coronary artery calcification noted. Aortic Atherosclerosis (ICD10-I70.0). Critical Value/emergent results were called by telephone at the time of interpretation on 03/06/2019 at 9:12 am to provider Dr. Ronnald Nian, ED physician, who verbally acknowledged these results. Electronically Signed: By: Lowella Grip III M.D. On: 03/06/2019 09:13   Dg Chest Port 1 View  Result Date: 03/06/2019 CLINICAL DATA:  Shortness of breath. EXAM: PORTABLE CHEST 1 VIEW COMPARISON:  CT 01/04/2019.  Chest x-ray 03/06/2019. FINDINGS: Cardiomegaly. No pulmonary venous congestion. Mild right base infiltrate. Bilateral pleural effusions. Right pleural effusion has improved from prior exam. No pneumothorax. Surgical clips over the right chest. IMPRESSION: 1.  Cardiomegaly.  No pulmonary venous congestion. 2. Mild right base infiltrate and bilateral pleural effusions again noted. Right pleural effusion has improved from prior exam. Electronically Signed   By: Marcello Moores  Register   On: 03/06/2019 12:38   Dg Chest Portable 1 View  Result Date: 03/06/2019 CLINICAL DATA:  85 year old female with shortness of breath. Recently diagnosed with lymphoma. EXAM: PORTABLE CHEST 1 VIEW COMPARISON:  CT Chest, Abdomen, and Pelvis 02/27/2019. FINDINGS: Portable  AP semi upright view at 0208 hours. Moderate size right pleural effusion and small left  pleural effusion appear not significantly changed. Stable cardiac size and mediastinal contours. Visualized tracheal air column is within normal limits. No pulmonary edema in the upper lungs. No pneumothorax or other confluent pulmonary opacity. No acute osseous abnormality identified. IMPRESSION: 1. Moderate size right and small left pleural effusions appear not significantly changed since 02/27/19. 2. No new cardiopulmonary abnormality. Electronically Signed   By: Genevie Ann M.D.   On: 03/06/2019 02:20    EKG: Independently reviewed.  Sinus tachycardia with rate 115; LAFB, no evidence of acute ischemia   Labs on Admission: I have personally reviewed the available labs and imaging studies at the time of the admission.  Pertinent labs:   CO2 19 Glucose 113 Albumin 3.4 HS troponin 67 -> 352 WBC 8.3 Platelets 72 COVID negative   Assessment/Plan Principal Problem:   Pulmonary embolism (HCC) Active Problems:   Hypothyroidism   Hypertension   Rash and other nonspecific skin eruption   Diffuse lymphadenopathy   Pleural effusion   PE -Patient being worked up for underlying malignancy presenting with acute SOB and tachycardia -CTA with at least submassive PE (Pulmonary embolus arising from the distal left main pulmonary artery with extension into multiple left lower lobe pulmonary arterial branches. There is also incompletely obstructing pulmonary embolus in the proximal left upper lobe pulmonary artery. More distal pulmonary embolus in the right lower lobe pulmonary artery.) -Will admit to Progressive Care -Initiate anticoagulation - for now, will start treatment-dose Heparin -Transition to oral anticoagulation may be complicated by thrombocytopenia -She is hemodynamically stable -She has marked HS troponin elevation which is likely associated with demand ischemia. -Will order stat echo.   -Cardiology will see but likely just PE management and follow echo.  If hemodynamically unstable,  would then consider alternative plans. -PCCM was consulted as part of the Code PE Protocol -DVT US of LUE and B LE  Diffuse LAD with significant concern for lymphoma -10/8 CT with extensive LAD throughout the chest, abdomen, and pelvis with index lymph nodes and splenomegaly, concerning for lymphoma (recurrent, metastatic breast cancer is less likely) -Also with suspicion for osseous metastatic disease -As such the patient was scheduled for an excisional lymph node biopsy on Tuesday, 10/19, but Dr. Blenda Nicely -I initially discussed the situation with Dr. Blenda Nicely and our plan was to transition to Lovenox, hold Monday PM dose, and resume Tuesday AM after biopsy -However, after discussion with Dr. Tamala Julian, it is preferred to continue all doses of AC for at least 2 weeks given the submassive PE and so biopsy would need to be deferred -Pleural effusion drained (see below) and if this gives diagnostic information, biopsy may be able to be deferred -She is very likely to need oncology f/u  Pleural effusion -Patient with R > L pleural effusion noted on prior imaging -Now presenting with "sizable" right-sided effusion -PCCM consulted and performed a bedside thoracentesis -Hopefully, this will allow for pathological diagnosis of her malignancy  Rash -Patient has been seeing allergy regarding this skin rash -Some forms of lymphoma can be associated with rash -Plan was for skin biopsy while in the OR for lymph node excision -Since the excisional biopsy has been delayed (perhaps indefinitely), skin biopsy is not planned at this time - but could be considered in the future -Continue Zyrtec (Claritin formulary substitution for now  HTN -Continue Norvasc  Hypothyroidism -Check TSH -Continue Synthroid at current dose for now  Note: This patient has been tested and is negative for the novel coronavirus COVID-19.  DVT prophylaxis: Heparin Code Status:  DNR - confirmed with patient Family  Communication: I spoke with her daughter briefly by telephone and also in the room Disposition Plan:  Home once clinically improved Consults called: Pulmonology, Cardiology; ENT by telephone  Admission status: Admit - It is my clinical opinion that admission to INPATIENT is reasonable and necessary because of the expectation that this patient will require hospital care that crosses at least 2 midnights to treat this condition based on the medical complexity of the problems presented.  Given the aforementioned information, the predictability of an adverse outcome is felt to be significant.    Karmen Bongo MD [facility]   How to contact the [facility] Attending or Consulting provider [place] or covering provider during after hours [place], for this patient?  1. Check the care team in [facility] and look for a) attending/consulting [facility] provider listed and b) the [facility] team listed 2. Log into [email or web address] and use [facility]'s universal password to access. If you do not have the password, please contact the hospital operator. 3. Locate the [facility] provider you are looking for under [facility] and page to a number that you can be directly reached. 4. If you still have difficulty reaching the provider, please page the [facility] (Director on Call) for the Hospitalists listed on amion for assistance.   03/06/2019, 12:41 PM

## 2019-03-06 NOTE — ED Notes (Signed)
Heparin restarted following bedside thoracentesis.

## 2019-03-06 NOTE — ED Notes (Signed)
Lab called needing lymphocyte redrawn. Tried to pull of R upper IV, no blood return.

## 2019-03-06 NOTE — ED Notes (Signed)
Rapid breathing earlier tonight  None at present no pain at present

## 2019-03-06 NOTE — ED Notes (Signed)
ED TO INPATIENT HANDOFF REPORT  ED Nurse Name and Phone #: Penny Pia [phone]  S Name/Age/Gender Sarah [name] 85 y.o. female Room/Bed: 013C/013C  Code Status   Code Status: DNR  Home/SNF/Other Home Patient oriented to: self, place, time and situation Is this baseline? Yes   Triage Complete: Triage complete  Chief Complaint breathing issues  Triage Note Pt came in EMS c/o SOB. Patient felt SOB while sitting at her desk at home. EMS arrival SpO2 low 80%. 110HR, 94% 3Lo2, 124/79, 134CBG.   Recently diagnosed with lymphoma. Had a big day today went to two doctors appointment. EMS stated Right Lower Lobe is diminished,    Allergies Allergies  Allergen Reactions  . Codeine Other (See Comments)    Reaction not recalled  . Thiazide-Type Diuretics Other (See Comments)    Blurry vision?? (patient stated she has macular degeneration)    Level of Care/Admitting Diagnosis ED Disposition    ED Disposition Condition [facility]: [facility] [[id number]]  Level of Care: Progressive [102]  Covid Evaluation: Confirmed COVID Negative  Diagnosis: Pulmonary embolism ([place]) [[id number]]  Admitting Physician: Karmen Bongo [[id number]]  Attending Physician: Karmen Bongo [[id number]]  Estimated length of stay: 3 - 4 days  Certification:: I certify this patient will need inpatient services for at least 2 midnights  PT Class (Do Not Modify): Inpatient [101]  PT Acc Code (Do Not Modify): Private [1]       B Medical/Surgery History Past Medical History:  Diagnosis Date  . Allergy    codeine, thiazides  . Anxiety    new dx  . Arthritis   . Breast cancer ([place]) 03/14/12   bx=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,ER/PR=+,upper inner quad  . Cancer ([place])    breast  . Cataract   . Glaucoma    laser treated years ago  . HOH (hard of hearing)   . Hyperlipemia   . Hypertension   . Hypothyroidism   . Pancreatic cyst    benign  . PONV  (postoperative nausea and vomiting)   . Radiation 06/11/2012-07/12/2012   17 sessions 4250 cGy, 3 sessions 750 cGy  . Vertigo   . Wears glasses   . Wears partial dentures    partial upper   Past Surgical History:  Procedure Laterality Date  . ABDOMINAL HYSTERECTOMY  1966   1/2 ovary left in   . BREAST SURGERY  1992   lumpectomy-lt  . CATARACT EXTRACTION     b/l  . COLONOSCOPY    . EYE SURGERY     bilateral cataract removal  . JOINT REPLACEMENT  2013   rt total knee  . JOINT REPLACEMENT  1995   lt total knee  . PARTIAL MASTECTOMY WITH NEEDLE LOCALIZATION  04/09/2012   Procedure: PARTIAL MASTECTOMY WITH NEEDLE LOCALIZATION;  Surgeon: Adin Hector, MD;  Location: [facility];  Service: General;  Laterality: Right;  . TONSILLECTOMY    . TOTAL KNEE ARTHROPLASTY  08/16/2011   Procedure: TOTAL KNEE ARTHROPLASTY;  Surgeon: Ninetta Lights, MD;  Location: [facility];  Service: Orthopedics;  Laterality: Right;     A IV Location/Drains/Wounds Patient Lines/Drains/Airways Status   Active Line/Drains/Airways    Name:   Placement date:   Placement time:   Site:   Days:   Peripheral IV 03/06/19 Left Antecubital   03/06/19    0814    Antecubital   less than 1   Peripheral IV 03/06/19 Right Forearm  03/06/19    0923    Forearm   less than 1   Closed System Drain 1 Knee Accordion (Hemovac) 10 Fr.   08/16/11    -    Knee   2759   Incision 08/16/11 Knee Right   08/16/11    1217     2759   Incision 04/09/12 Chest Right   04/09/12    1501     2522   Incision 04/09/12 Breast Right   04/09/12    1501     2522          Intake/Output Last 24 hours No intake or output data in the 24 hours ending 03/06/19 2142  Labs/Imaging Results for orders placed or performed during the hospital encounter of 03/06/19 (from the past 48 hour(s))  CBC with Differential     Status: Abnormal   Collection Time: 03/06/19  1:58 AM  Result Value Ref Range   WBC 8.3 4.0 - 10.5 K/uL   RBC 4.91 3.87 -  5.11 MIL/uL   Hemoglobin 14.0 12.0 - 15.0 g/dL   HCT 45.3 36.0 - 46.0 %   MCV 92.3 80.0 - 100.0 fL   MCH 28.5 26.0 - 34.0 pg   MCHC 30.9 30.0 - 36.0 g/dL   RDW 14.4 11.5 - 15.5 %   Platelets 72 (L) 150 - 400 K/uL    Comment: REPEATED TO VERIFY PLATELET COUNT CONFIRMED BY SMEAR SPECIMEN CHECKED FOR CLOTS Immature Platelet Fraction may be clinically indicated, consider ordering this additional test [id number]    nRBC 0.0 0.0 - 0.2 %   Neutrophils Relative % 73 %   Neutro Abs 6.2 1.7 - 7.7 K/uL   Lymphocytes Relative 9 %   Lymphs Abs 0.7 0.7 - 4.0 K/uL   Monocytes Relative 9 %   Monocytes Absolute 0.7 0.1 - 1.0 K/uL   Eosinophils Relative 7 %   Eosinophils Absolute 0.6 (H) 0.0 - 0.5 K/uL   Basophils Relative 1 %   Basophils Absolute 0.0 0.0 - 0.1 K/uL   Immature Granulocytes 1 %   Abs Immature Granulocytes 0.08 (H) 0.00 - 0.07 K/uL    Comment: Performed at [facility], [address] [place], [place] [address]  Comprehensive metabolic panel     Status: Abnormal   Collection Time: 03/06/19  1:58 AM  Result Value Ref Range   Sodium 139 135 - 145 mmol/L   Potassium 3.5 3.5 - 5.1 mmol/L   Chloride 105 98 - 111 mmol/L   CO2 19 (L) 22 - 32 mmol/L   Glucose, Bld 113 (H) 70 - 99 mg/dL   BUN 19 8 - 23 mg/dL   Creatinine, Ser 0.79 0.44 - 1.00 mg/dL   Calcium 9.3 8.9 - 10.3 mg/dL   Total Protein 6.0 (L) 6.5 - 8.1 g/dL   Albumin 3.4 (L) 3.5 - 5.0 g/dL   AST 21 15 - 41 U/L   ALT 11 0 - 44 U/L   Alkaline Phosphatase 85 38 - 126 U/L   Total Bilirubin 0.4 0.3 - 1.2 mg/dL   GFR calc non Af Amer >60 >60 mL/min   GFR calc Af Amer >60 >60 mL/min   Anion gap 15 5 - 15    Comment: Performed at [facility] [address] [place], [place] [address]  Troponin I (High Sensitivity)     Status: Abnormal   Collection Time: 03/06/19  1:58 AM  Result Value Ref Range   Troponin I (High  Sensitivity) 67 (H) <18 ng/L    Comment: (NOTE) Elevated high sensitivity troponin I (hsTnI)  values and significant  changes across serial measurements may suggest ACS but many other  chronic and acute conditions are known to elevate hsTnI results.  Refer to the "Links" section for chest pain algorithms and additional  guidance. Performed at [facility], [place] [address] [place], [place] [address]   Troponin I (High Sensitivity)     Status: Abnormal   Collection Time: 03/06/19  5:24 AM  Result Value Ref Range   Troponin I (High Sensitivity) 352 (HH) <18 ng/L    Comment: CRITICAL RESULT CALLED TO, READ BACK BY AND VERIFIED WITH: C.CHRISMON,RN 03/06/2019 0706 [place]ISB (NOTE) Elevated high sensitivity troponin I (hsTnI) values and significant  changes across serial measurements may suggest ACS but many other  chronic and acute conditions are known to elevate hsTnI results.  Refer to the Links section for chest pain algorithms and additional  guidance. Performed at [facility], [place] [address], [place], [place] [address]   SARS CORONAVIRUS 2 (TAT 6-24 HRS) Nasopharyngeal Nasopharyngeal Swab     Status: None   Collection Time: 03/06/19  5:42 AM   Specimen: Nasopharyngeal Swab  Result Value Ref Range   SARS Coronavirus 2 NEGATIVE NEGATIVE    Comment: (NOTE) SARS-CoV-2 target nucleic acids are NOT DETECTED. The SARS-CoV-2 RNA is generally detectable in upper and lower respiratory specimens during the acute phase of infection. Negative results do not preclude SARS-CoV-2 infection, do not rule out co-infections with other pathogens, and should not be used as the sole basis for treatment or other patient management decisions. Negative results must be combined with clinical observations, patient history, and epidemiological information. The expected result is Negative. Fact Sheet for Patients: SugarRoll.be Fact Sheet for Healthcare Providers: https://www.woods-mathews.com/ This test is not yet approved or cleared by the [place] FDA and  has been authorized for detection and/or diagnosis of SARS-CoV-2 by FDA under an Emergency Use Authorization (EUA). This EUA will remain  in effect (meaning this test can be used) for the duration of the COVID-19 declaration under Section 56 4(b)(1) of the Act, 21 U.S.C. section 360bbb-3(b)(1), unless the authorization is terminated or revoked sooner. Performed at [facility], [place] [address], [place], [place] [address]   Lactate dehydrogenase (pleural or peritoneal fluid)     Status: Abnormal   Collection Time: 03/06/19 11:31 AM  Result Value Ref Range   LD, Fluid 107 (H) 3 - 23 U/L    Comment: (NOTE) Results should be evaluated in conjunction with serum values    Fluid Type-FLDH PLEURAL RIGHT     Comment: Performed at [facility], [place] [address], [place], [place] [address]  Glucose, pleural or peritoneal fluid     Status: None   Collection Time: 03/06/19 11:31 AM  Result Value Ref Range   Glucose, Fluid 107 mg/dL    Comment: (NOTE) No normal range established for this test Results should be evaluated in conjunction with serum values    Fluid Type-FGLU PLEURAL RIGHT     Comment: Performed at [facility] [address] [place], [place] [address]  Body fluid cell count with differential     Status: Abnormal   Collection Time: 03/06/19 11:31 AM  Result Value Ref Range   Fluid Type-FCT PLEURAL RIGHT    Color, Fluid YELLOW (A) YELLOW   Appearance, Fluid HAZY (A) CLEAR   Total Nucleated Cell  Count, Fluid 825 0 - 1,000 cu mm   Neutrophil Count, Fluid 51 (H) 0 - 25 %   Lymphs, Fluid 44 %   Monocyte-Macrophage-Serous Fluid 4 (L) 50 - 90 %   Eos, Fluid 1 %   Other Cells, Fluid MESOTHELIAL %    Comment: Performed at [facility] [address] [place], [place] [address]  Protein, pleural or peritoneal fluid     Status: None   Collection Time: 03/06/19 11:31 AM  Result Value Ref Range   Total protein, fluid 3.5 g/dL    Comment:  (NOTE) No normal range established for this test Results should be evaluated in conjunction with serum values    Fluid Type-FTP PLEURAL RUGHT     Comment: Performed at [facility] [address] [place], [place] [address]  Troponin I (High Sensitivity)     Status: Abnormal   Collection Time: 03/06/19 11:33 AM  Result Value Ref Range   Troponin I (High Sensitivity) 321 (HH) <18 ng/L    Comment: CRITICAL VALUE NOTED.  VALUE IS CONSISTENT WITH PREVIOUSLY REPORTED AND CALLED VALUE. (NOTE) Elevated high sensitivity troponin I (hsTnI) values and significant  changes across serial measurements may suggest ACS but many other  chronic and acute conditions are known to elevate hsTnI results.  Refer to the Links section for chest pain algorithms and additional  guidance. Performed at [facility], [place] [address] [place], [place] [address]   Troponin I (High Sensitivity)     Status: Abnormal   Collection Time: 03/06/19  2:36 PM  Result Value Ref Range   Troponin I (High Sensitivity) 207 (HH) <18 ng/L    Comment: CRITICAL VALUE NOTED.  VALUE IS CONSISTENT WITH PREVIOUSLY REPORTED AND CALLED VALUE. (NOTE) Elevated high sensitivity troponin I (hsTnI) values and significant  changes across serial measurements may suggest ACS but many other  chronic and acute conditions are known to elevate hsTnI results.  Refer to the Links section for chest pain algorithms and additional  guidance. Performed at [facility], [place] [address], [place], [place] [address]   TSH     Status: None   Collection Time: 03/06/19  2:36 PM  Result Value Ref Range   TSH 1.531 0.350 - 4.500 uIU/mL    Comment: Performed by a 3rd Generation assay with a functional sensitivity of <=0.01 uIU/mL. Performed at [facility], [place] [address] [place], [place] [address]   Heparin level (unfractionated)     Status: Abnormal   Collection Time: 03/06/19  7:03 PM  Result Value Ref Range   Heparin  Unfractionated 0.10 (L) 0.30 - 0.70 IU/mL    Comment: (NOTE) If heparin results are below expected values, and patient dosage has  been confirmed, suggest follow up testing of antithrombin III levels. Performed at [facility], [place] [address], [place], [place] [address]    Ct Angio Chest Pe W And/or Wo Contrast  Addendum Date: 03/06/2019   ADDENDUM REPORT: 03/06/2019 09:17 ADDENDUM: Comment: No dominant thyroid lesions. Electronically Signed   By: Lowella Grip III M.D.   On: 03/06/2019 09:17   Result Date: 03/06/2019 CLINICAL DATA:  Shortness of breath.  History of breast carcinoma EXAM: CT ANGIOGRAPHY CHEST WITH CONTRAST TECHNIQUE: Multidetector CT imaging of the chest was performed using the standard protocol during bolus administration of intravenous contrast. Multiplanar CT image reconstructions and MIPs were obtained to evaluate the vascular anatomy. CONTRAST:  165mL OMNIPAQUE IOHEXOL 350  MG/ML SOLN COMPARISON:  February 27, 2019 CT chest, abdomen, and pelvis; chest CT September 06, 2011 FINDINGS: Cardiovascular: There are pulmonary emboli arising from the distal most aspect of the left main pulmonary artery with extension primarily into left lower lobe pulmonary arterial branches. There is incompletely obstructing pulmonary embolus as well in the left upper lobe pulmonary artery proximally. There is more peripheral pulmonary embolus in the right lower lobe region. The right ventricle to left ventricle diameter ratio is 1.6, indicative of right heart strain. There is no appreciable thoracic aortic aneurysm or dissection. The visualized great vessels show patchy areas of atherosclerotic calcification. There is extensive aortic atherosclerosis as well as scattered foci of coronary artery calcification. There is no pericardial effusion or pericardial thickening. Mediastinum/Nodes: There are multiple lymph nodes throughout the axillary and subpectoral regions. Most of these lymph nodes are  subcentimeter. Several lymph nodes are borderline prominent, unchanged from 1 week prior. There are several prominent lymph nodes surrounding the mid to distal trachea, largest measuring 1.2 x 1.1 cm. There is no new adenopathy compared to 1 week prior. No esophageal lesions are evident. Lungs/Pleura: There is a large pleural effusion on the right with a much smaller pleural effusion on the left. There is consolidation, largely due to compressive atelectasis throughout the right lower lobe. There is milder compressive atelectasis in the left base. There is a nodular opacity abutting the pleura in the anterior segment of the right upper lobe measuring 1.1 x 0.6 cm. A nodular opacity abutting the pleura in the right middle lobe measures 7 mm, better seen on recent prior study. Upper Abdomen: In the visualized upper abdomen, there is aortic atherosclerosis. Spleen is incompletely visualized but appears enlarged. Spleen is noted to be enlarged on recent CT abdomen study. There appears to be adenopathy in the upper retroperitoneum, incompletely visualized. Musculoskeletal: There is degenerative change in the thoracic spine. No blastic or lytic bone lesions are evident. A bone island is noted in the T4 vertebral body, stable. Postoperative change in the right breast region noted. Review of the MIP images confirms the above findings. IMPRESSION: 1. Pulmonary embolus arising from the distal left main pulmonary artery with extension into multiple left lower lobe pulmonary arterial branches. There is also incompletely obstructing pulmonary embolus in the proximal left upper lobe pulmonary artery. More distal pulmonary embolus in the right lower lobe pulmonary artery. Positive for acute PE with CT evidence of right heart strain (RV/LV Ratio = 1.6) consistent with at least submassive (intermediate risk) PE. The presence of right heart strain has been associated with an increased risk of morbidity and mortality. Please activate  Code PE by paging [phone]. 2. Sizable pleural effusion on the right with smaller pleural effusion on the left. Consolidation and compressive atelectasis throughout most of the right lower lobe. Milder atelectasis left base. Nodular opacities on the right, likely metastatic foci. 3.  Stable adenopathy compared to 1 week prior. 4. Enlargement of spleen, incompletely visualized but documented CT 1 week prior. Concern for potential lymphoma. 5. Aortic atherosclerosis. No aneurysm or dissection evident. Foci of coronary artery calcification noted. Aortic Atherosclerosis (ICD10-I70.0). Critical Value/emergent results were called by telephone at the time of interpretation on 03/06/2019 at 9:12 am to provider Dr. Ronnald Nian, ED physician, who verbally acknowledged these results. Electronically Signed: By: Lowella Grip III M.D. On: 03/06/2019 09:13   Dg Chest Port 1 View  Result Date: 03/06/2019 CLINICAL DATA:  Shortness of breath. EXAM: PORTABLE CHEST 1 VIEW COMPARISON:  CT  01/04/2019.  Chest x-ray 03/06/2019. FINDINGS: Cardiomegaly. No pulmonary venous congestion. Mild right base infiltrate. Bilateral pleural effusions. Right pleural effusion has improved from prior exam. No pneumothorax. Surgical clips over the right chest. IMPRESSION: 1.  Cardiomegaly.  No pulmonary venous congestion. 2. Mild right base infiltrate and bilateral pleural effusions again noted. Right pleural effusion has improved from prior exam. Electronically Signed   By: Marcello Moores  Register   On: 03/06/2019 12:38   Dg Chest Portable 1 View  Result Date: 03/06/2019 CLINICAL DATA:  85 year old female with shortness of breath. Recently diagnosed with lymphoma. EXAM: PORTABLE CHEST 1 VIEW COMPARISON:  CT Chest, Abdomen, and Pelvis 02/27/2019. FINDINGS: Portable AP semi upright view at 0208 hours. Moderate size right pleural effusion and small left pleural effusion appear not significantly changed. Stable cardiac size and mediastinal contours.  Visualized tracheal air column is within normal limits. No pulmonary edema in the upper lungs. No pneumothorax or other confluent pulmonary opacity. No acute osseous abnormality identified. IMPRESSION: 1. Moderate size right and small left pleural effusions appear not significantly changed since 02/27/19. 2. No new cardiopulmonary abnormality. Electronically Signed   By: Genevie Ann M.D.   On: 03/06/2019 02:20    Pending Labs Unresulted Labs (From admission, onward)    Start     Ordered   03/07/19 0500  Heparin level (unfractionated)  Daily,   R     03/06/19 0744   03/07/19 0500  CBC  Daily,   R     03/06/19 0744   03/07/19 4166  Basic metabolic panel  Tomorrow morning,   R     03/06/19 1119   03/07/19 0500  Heparin level (unfractionated)  Once-Timed,   STAT     03/06/19 2039   03/06/19 1402  Lymphocyte subsets,flow cytometry (InPt)  Add-on,   AD    Question:  Test for these components:  Answer:  Usual lymphoma markers   03/06/19 1401   03/06/19 1132  PH, Body Fluid  (Thoracentesis Labs Panel)  Once,   STAT    Comments: Send specimen on ice.    03/06/19 1132   03/06/19 1131  Body fluid culture (includes gram stain)  (Thoracentesis Labs Panel)  Once,   STAT    Question:  Are there also cytology or pathology orders on this specimen?  Answer:  Yes   03/06/19 1132          Vitals/Pain Today's Vitals   03/06/19 2045 03/06/19 2100 03/06/19 2115 03/06/19 2130  BP: 122/71 127/70 132/76 131/87  Pulse: 89 90 94 94  Resp: (!) 22 19 (!) 27 19  Temp:      TempSrc:      SpO2: 96% 97% 94% 94%  Weight:      PainSc:        Isolation Precautions No active isolations  Medications Medications  heparin ADULT infusion 100 units/mL (25000 units/23mL sodium chloride 0.45%) (1,150 Units/hr Intravenous Rate/Dose Verify 03/06/19 2046)  zolpidem (AMBIEN) tablet 2.5 mg (has no administration in time range)  levothyroxine (SYNTHROID) tablet 25 mcg (has no administration in time range)  loratadine  (CLARITIN) tablet 10 mg (10 mg Oral Refused 03/06/19 1446)  olopatadine (PATANOL) 0.1 % ophthalmic solution 1 drop (1 drop Both Eyes Not Given 03/06/19 1800)  acetaminophen (TYLENOL) tablet 650 mg (has no administration in time range)    Or  acetaminophen (TYLENOL) suppository 650 mg (has no administration in time range)  docusate sodium (COLACE) capsule 100 mg (100 mg Oral Given 03/06/19 1443)  ondansetron (ZOFRAN) tablet 4 mg (has no administration in time range)    Or  ondansetron (ZOFRAN) injection 4 mg (has no administration in time range)  amLODipine (NORVASC) tablet 5 mg (5 mg Oral Given 03/06/19 1443)  amLODipine (NORVASC) tablet 2.5 mg (2.5 mg Oral Given 03/06/19 1758)  acetaminophen (TYLENOL) tablet 650 mg (650 mg Oral Given 03/06/19 0823)  heparin bolus via infusion 3,000 Units (3,000 Units Intravenous Bolus from Bag 03/06/19 0926)  iohexol (OMNIPAQUE) 350 MG/ML injection 100 mL (100 mLs Intravenous Contrast Given 03/06/19 0835)  heparin bolus via infusion 2,000 Units (2,000 Units Intravenous Bolus from Bag 03/06/19 2045)    Mobility walks with person assist Moderate fall risk   Focused Assessments Pulmonary Assessment Handoff:  Lung sounds:   O2 Device: Nasal Cannula O2 Flow Rate (L/min): 2 L/min      R Recommendations: See Admitting Provider Note  Report given to:   Additional Notes: Pt already on hospital bed

## 2019-03-06 NOTE — Consult Note (Signed)
NAME:  Sarah [name], MRN:  [id number], DOB:  [birth year], LOS: 0 ADMISSION DATE:  03/06/2019, CONSULTATION DATE:  03/06/19  REFERRING MD:  Lorin Mercy, CHIEF COMPLAINT:  SOB  Brief History   85 yo F with submassive PE and  R>L pleural effusion. Hx breast cancer; recent concern for lymphoma.   History of present illness   85 yo F PMH breast cancer (2013), hypothyroidism, HTN, HLD who presented to ED 10/15 with CC SOB. Patient endorses shortness of breath with associated palpitations, beginning yesterday evening. Initially the patient believed she was experiencing AMI due to racing heart and called 911.  In ED, found to have submassive PE as well as bilateral pleural effusions (R>L) which are similar in appearance to prior. COVID-19 is negative. Denies sick contacts. Denies fever, chills.  Endorses dry cough beginning 1 month ago. Endorses BLE edema, L hand swelling, hives. Endorses loss of appetite and unintentional weight gain.  Recent concern for possible Lymphoma; patient is to undergo bx next week.   [facility] to admit, PCCM consulting   Past Medical History  Breast Cancer HTN HLD Anxiety Hypothyroidism Vertigo  Benign pancreatic cyst  Glaucoma [facility] Events   10/15 presents to ED. Submassive PE and R>L pleural effusion. Started on heparin gtt. [facility] to admit. PCCM consulting, likely thoracentesis   Consults:  PCCM  Procedures:    Significant Diagnostic Tests:  10/8 CT chest/abd/pel  w/con> -- "Extensive lymphadenopathy throughout the chest, abdomen, and pelvis, with index lymph nodes identified above. Splenomegaly. Findings are most consistent with lymphoma with recurrent, metastatic breast malignancy less favored. -- There is generally osteopenic appearance of the skeleton. There are numerous subtle hypodense lesions, particularly of the pelvis (e.g. Series 2, image 88) and select vertebral bodies, for example L4 (series 8, image 92). This is  suspicious although not definitive for osseous metastatic disease. Characterization for metabolic activity by nuclear scintigraphic bone scan or PET-CT would be helpful to further evaluate. --Moderate right, small left pleural effusions with associated atelectasis or consolidation. Subpleural radiation fibrosis of the anterior right lung. --There is a new subpleural pulmonary nodule of the anterior right middle lobe measuring 6 mm (series 4, image 105), nonspecific. There is no obvious pleural thickening or nodularity definitive for metastatic disease. --Small volume ascites. --There is a fluid attenuation lesion of the pancreatic uncinate measuring 2.4 x 1.3 cm (series 2, image 63), not substantially changed when compared to remote prior MR examination dated 02/23/2012 and likely sequelae of a prior pseudocyst versus incidental pancreatic IPMN." -radiology read 02/27/2019  10/8 CT soft tissue neck w/con > -- "Bilateral cervical lymphadenopathy most concerning for lymphoma given findings on separate chest, abdomen, and pelvis CT today. --16 mm left superior oropharyngeal mass and 8 mm mucosal/submucosal lesion more inferiorly involving the anterior left tonsil" -radiology read 02/27/2019  10/15 CTA chest> -- Submassive PE: Pulmonary embolism distal L main pulmonary artery into multiple left lower lobe pulmonary arterial branches. Pulmonary embolism in Proximal Left upper lobe pulmonary artery. Additional pulmonary emboli distal right lowe lobe pulmonary artery.  --Moderate right sided pleural effusion. Small left sided pleural effusion --Right sided nodular opacities (1.1cm x 0.6cm RUL, 40mm RML) --Stable lymphadenopathy from prior: multiple lymph nodes through axillary and subpectoral region (most sub-centimeter). Prominent lymph nodes surrounding mid-distal trachea, up to 1.2 x 1.1 cm   10/15 CXR> --Moderate R sided pleural effusion, small Left pleural effusion without significant change from  prior exam (10/8)  Micro Data:  10/15  SARS CoV2> neg   Antimicrobials:    Interim history/subjective:  Started on heparin gtt in ED after discovery of Pulmonary Embolism   Objective   Blood pressure (!) 122/91, pulse 100, temperature (!) 97.4 F (36.3 C), temperature source Oral, resp. rate 18, weight 56.7 kg, SpO2 95 %.       No intake or output data in the 24 hours ending 03/06/19 1025 Filed Weights   03/06/19 0715  Weight: 56.7 kg    Examination: General: Frail appearing elderly female, supine in NAD  HENT: NCAT, anicteric sclera, trachea midline  Lungs: Diminished R lung sounds. Symmetrical chest expansion, shallow respirations, no accessory muscle use.  Cardiovascular: tachycardic rate, regular rhythm. s1s2. Cap refill < 3sec Abdomen: soft, flaot, ndnt.  Extremities: BLE edema No obvious joint malformation  Neuro: Awake, alert, oriented. Following commands  GU: defer  Skin: erythematous, pruritic rash   Resolved Hospital Problem list     Assessment & Plan:   Pulmonary Embolism  -some tachycardia, stable blood pressure -high sensitivity troponin 67>> 352  -PESI 114 (high risk) Pleural Effusion -seen on CTA chest  Concern for lymphoma -likely that scheduled lymph node bx will need to be deferred in setting of PE -Will send pleural fluid if consent obtained for thoracentesis  P -[facility] hospitalist team to admit -Heparin gtt for PE -Consent for and perform thoracentesis, send pleural labs and cytology -Will need follow up CXR following thoracentesis  -Continue tele monitoring    Rest Per Primary  Best practice:  Diet: NPO Pain/Anxiety/Delirium protocol (if indicated): na VAP protocol (if indicated): na DVT prophylaxis: heparin gtt GI prophylaxis: na Glucose control: monitor  Code Status: DNR Mobility: Bed Rest x 24 hours after thoracentesis  Family Communication: Per primary  Disposition: Admit to hospitalist service   Labs   CBC: Recent Labs   Lab 03/06/19 0158  WBC 8.3  NEUTROABS 6.2  HGB 14.0  HCT 45.3  MCV 92.3  PLT 72*    Basic Metabolic Panel: Recent Labs  Lab 03/06/19 0158  NA 139  K 3.5  CL 105  CO2 19*  GLUCOSE 113*  BUN 19  CREATININE 0.79  CALCIUM 9.3   GFR: Estimated Creatinine Clearance: 46.2 mL/min (by C-G formula based on SCr of 0.79 mg/dL). Recent Labs  Lab 03/06/19 0158  WBC 8.3    Liver Function Tests: Recent Labs  Lab 03/06/19 0158  AST 21  ALT 11  ALKPHOS 85  BILITOT 0.4  PROT 6.0*  ALBUMIN 3.4*   No results for input(s): LIPASE, AMYLASE in the last 168 hours. No results for input(s): AMMONIA in the last 168 hours.  ABG No results found for: PHART, PCO2ART, PO2ART, HCO3, TCO2, ACIDBASEDEF, O2SAT   Coagulation Profile: No results for input(s): INR, PROTIME in the last 168 hours.  Cardiac Enzymes: No results for input(s): CKTOTAL, CKMB, CKMBINDEX, TROPONINI in the last 168 hours.  HbA1C: No results found for: HGBA1C  CBG: No results for input(s): GLUCAP in the last 168 hours.  Review of Systems:   Endorses SOB, dry cough. Denies runny nose Denies fever, chills, nightsweats Endorses palpitations, endorses BLE edema, denies chest pain  Endorses pruiritic rash Endorses loss of appetite, endorses weight gain, endorses difficulty sleeping  Past Medical History  She,  has a past medical history of Allergy, Anxiety, Arthritis, Breast cancer ([place]) (03/14/12), Cancer ([place]), Cataract, Glaucoma, HOH (hard of hearing), Hyperlipemia, Hypertension, Hypothyroidism, Pancreatic cyst, PONV (postoperative nausea and vomiting), Radiation (06/11/2012-07/12/2012), Vertigo, Wears glasses, and Wears partial  dentures.   Surgical History    Past Surgical History:  Procedure Laterality Date  . ABDOMINAL HYSTERECTOMY  1966   1/2 ovary left in   . BREAST SURGERY  1992   lumpectomy-lt  . CATARACT EXTRACTION     b/l  . COLONOSCOPY    . EYE SURGERY     bilateral cataract removal  . JOINT  REPLACEMENT  2013   rt total knee  . JOINT REPLACEMENT  1995   lt total knee  . PARTIAL MASTECTOMY WITH NEEDLE LOCALIZATION  04/09/2012   Procedure: PARTIAL MASTECTOMY WITH NEEDLE LOCALIZATION;  Surgeon: Adin Hector, MD;  Location: [facility];  Service: General;  Laterality: Right;  . TONSILLECTOMY    . TOTAL KNEE ARTHROPLASTY  08/16/2011   Procedure: TOTAL KNEE ARTHROPLASTY;  Surgeon: Ninetta Lights, MD;  Location: [facility];  Service: Orthopedics;  Laterality: Right;     Social History   reports that she has never smoked. She has never used smokeless tobacco. She reports that she does not drink alcohol or use drugs.   Family History   Her family history includes Cancer in her maternal aunt; Heart disease in her father.   Allergies Allergies  Allergen Reactions  . Codeine Other (See Comments)    Unknown reaction  . Thiazide-Type Diuretics Other (See Comments)    Blurry vision     Home Medications  Prior to Admission medications   Medication Sig Start Date End Date Taking? Authorizing Provider  amLODipine (NORVASC) 2.5 MG tablet Take 2.5-5 mg by mouth See admin instructions. 5 mg in the morning, 2.5 mg in the evening 12/21/18   [provider]  Calcium Carbonate (CALCIUM 600 PO) Take 600 mg by mouth daily.    [provider]  cetirizine (ZYRTEC) 10 MG tablet Take 10 mg by mouth 2 (two) times daily.  01/23/19   [provider]  Cholecalciferol (VITAMIN D) 50 MCG (2000 UT) tablet Take 2,000 Units by mouth 2 (two) times daily.    [provider]  Cyanocobalamin (VITAMIN B-12 PO) Take 3,000 mcg by mouth daily.     [provider]  hydrOXYzine (ATARAX/VISTARIL) 10 MG tablet Take 1 tablet (10 mg total) by mouth at bedtime as needed for itching. Patient not taking: Reported on 03/05/2019 02/06/19   Garnet Sierras, DO  Krill Oil (OMEGA-3) 500 MG CAPS Take 500 mg by mouth daily.    [provider]  levothyroxine (SYNTHROID) 25  MCG tablet Take 25 mcg by mouth daily before breakfast.  09/20/18   [provider]  Multiple Vitamins-Minerals (PRESERVISION AREDS PO) Take 1 capsule by mouth 2 (two) times daily.     [provider]  olopatadine (PATANOL) 0.1 % ophthalmic solution Place 1 drop into both eyes 2 (two) times daily.    [provider]  triamcinolone cream (KENALOG) 0.1 % Apply 1 application topically 2 (two) times daily. 02/26/19   [provider]  vitamin E 400 UNIT capsule Take 400 Units by mouth daily.    [provider]  zolpidem (AMBIEN) 10 MG tablet Take 2.5 mg by mouth at bedtime.  07/24/14   [provider]       Eliseo Gum MSN, AGACNP-BC [place] [phone] If no answer, [phone] 03/06/2019, 10:57 AM

## 2019-03-06 NOTE — ED Notes (Addendum)
Heparin paused at present time for thoracentesis per critical care. Restart at same rate of 950 units/hr following procedure.

## 2019-03-06 NOTE — Consult Note (Signed)
Cardiology Consultation:   Patient ID: [name] MRN: [id number]; DOB: 07/27/1932  Admit date: 03/06/2019 Date of Consult: 03/06/2019  Primary Care Provider: Lajean Manes, MD Primary Cardiologist: Buford Dresser, MD   Patient Profile:   Sarah [name] is a 85 y.o. female with a hx of prior breast cancer, recent workup for possible lymphoma who is being seen today for the evaluation of elevated troponins in the setting of PE at the request of Dr. Lorin Mercy.  History of Present Illness:   Sarah [name] presented with shortness of breath, cough, and palpitations, for which she called 911. She was brought to the ER, and evaluation found bilateral pleural effusions and submassive PE. Cardiology called for elevated troponins.  On my interview, she and her daughter are present. Patient is very chilled despite multiple warm blankets. She denies chest pain, does continue to have a cough. Had thoracentesis earlier today. They note that she is being evaluated for possible lymphoma and had a biopsy scheduled for next week. She also endorses diffuse swelling and rash. Workup has also noted thrombocytopenia.   No known cardiac history. Does have risk factors of hypertension and hyperlipidemia.  Denies chest pain. Does endorse shortness of breath at rest or with normal exertion, LE edema, unexpected weight gain, and palpitations. Denies syncope.  Heart Pathway Score:     Past Medical History:  Diagnosis Date  . Allergy    codeine, thiazides  . Anxiety    new dx  . Arthritis   . Breast cancer ([place]) 03/14/12   bx=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,ER/PR=+,upper inner quad  . Cancer ([place])    breast  . Cataract   . Glaucoma    laser treated years ago  . HOH (hard of hearing)   . Hyperlipemia   . Hypertension   . Hypothyroidism   . Pancreatic cyst    benign  . PONV (postoperative nausea and vomiting)   . Radiation 06/11/2012-07/12/2012   17 sessions 4250  cGy, 3 sessions 750 cGy  . Vertigo   . Wears glasses   . Wears partial dentures    partial upper    Past Surgical History:  Procedure Laterality Date  . ABDOMINAL HYSTERECTOMY  1966   1/2 ovary left in   . BREAST SURGERY  1992   lumpectomy-lt  . CATARACT EXTRACTION     b/l  . COLONOSCOPY    . EYE SURGERY     bilateral cataract removal  . JOINT REPLACEMENT  2013   rt total knee  . JOINT REPLACEMENT  1995   lt total knee  . PARTIAL MASTECTOMY WITH NEEDLE LOCALIZATION  04/09/2012   Procedure: PARTIAL MASTECTOMY WITH NEEDLE LOCALIZATION;  Surgeon: Adin Hector, MD;  Location: [facility];  Service: General;  Laterality: Right;  . TONSILLECTOMY    . TOTAL KNEE ARTHROPLASTY  08/16/2011   Procedure: TOTAL KNEE ARTHROPLASTY;  Surgeon: Ninetta Lights, MD;  Location: [facility];  Service: Orthopedics;  Laterality: Right;     Home Medications:  Prior to Admission medications   Medication Sig Start Date End Date Taking? Authorizing Provider  amLODipine (NORVASC) 2.5 MG tablet Take 2.5-5 mg by mouth See admin instructions. 5 mg in the morning, 2.5 mg in the evening 12/21/18  Yes [provider]  Calcium Carbonate (CALCIUM 600 PO) Take 600 mg by mouth daily.   Yes [provider]  cetirizine (ZYRTEC) 10 MG tablet Take 10 mg by mouth daily.  01/23/19  Yes [provider]  Cholecalciferol (VITAMIN D) 50 MCG (2000 UT) tablet Take 2,000 Units by mouth 2 (two) times daily.   Yes [provider]  Cyanocobalamin (VITAMIN B-12 PO) Take 3,000 mcg by mouth daily.    Yes [provider]  Javier Docker Oil (OMEGA-3) 500 MG CAPS Take 500 mg by mouth daily.   Yes [provider]  levothyroxine (SYNTHROID) 25 MCG tablet Take 25 mcg by mouth daily before breakfast.  09/20/18  Yes [provider]  Multiple Vitamins-Minerals (PRESERVISION AREDS PO) Take 1 capsule by mouth 2 (two) times daily.    Yes [provider]  olopatadine (PATANOL)  0.1 % ophthalmic solution Place 1 drop into both eyes 2 (two) times daily.   Yes [provider]  triamcinolone cream (KENALOG) 0.1 % Apply 1 application topically 2 (two) times daily. 02/26/19  Yes [provider]  vitamin E 400 UNIT capsule Take 400 Units by mouth daily.   Yes [provider]  zolpidem (AMBIEN) 10 MG tablet Take 2.5 mg by mouth at bedtime.  07/24/14  Yes [provider]    Inpatient Medications: Scheduled Meds: . amLODipine  2.5 mg Oral QPM  . amLODipine  5 mg Oral q morning - 10a  . docusate sodium  100 mg Oral BID  . [START ON 03/07/2019] levothyroxine  25 mcg Oral QAC breakfast  . loratadine  10 mg Oral Daily  . olopatadine  1 drop Both Eyes BID  . zolpidem  2.5 mg Oral QHS   Continuous Infusions: . heparin 950 Units/hr (03/06/19 0924)   PRN Meds: acetaminophen **OR** acetaminophen, ondansetron **OR** ondansetron (ZOFRAN) IV  Allergies:    Allergies  Allergen Reactions  . Codeine Other (See Comments)    Unknown reaction  . Thiazide-Type Diuretics Other (See Comments)    Blurry vision    Social History:   Social History   Socioeconomic History  . Marital status: Widowed    Spouse name: Not on file  . Number of children: 2  . Years of education: Not on file  . Highest education level: Not on file  Occupational History  . Occupation: Retired    Comment: Academic librarian  . Financial resource strain: Not on file  . Food insecurity    Worry: Not on file    Inability: Not on file  . Transportation needs    Medical: Not on file    Non-medical: Not on file  Tobacco Use  . Smoking status: Never Smoker  . Smokeless tobacco: Never Used  Substance and Sexual Activity  . Alcohol use: No  . Drug use: No  . Sexual activity: Never  Lifestyle  . Physical activity    Days per week: Not on file    Minutes per session: Not on file  . Stress: Not on file  Relationships  . Social Herbalist on  phone: Not on file    Gets together: Not on file    Attends religious service: Not on file    Active member of club or organization: Not on file    Attends meetings of clubs or organizations: Not on file    Relationship status: Not on file  . Intimate partner violence    Fear of current or ex partner: Not on file    Emotionally abused: Not on file    Physically abused: Not on file    Forced sexual activity: Not on file  Other Topics Concern  . Not  on file  Social History Narrative  . Not on file    Family History:    Family History  Problem Relation Age of Onset  . Heart disease Father   . Cancer Maternal Aunt        stomach     ROS:  Please see the history of present illness.  Constitutional: Positive for chills, negative for fever HENT: Negative for ear pain and hearing loss.   Eyes: Negative for loss of vision and eye pain.  Respiratory: Positive for cough, shortness of breath Cardiovascular: See HPI. Gastrointestinal: Negative for abdominal pain. No recent bleeding but has had intermittent hematochezia Genitourinary: Negative for dysuria and hematuria.  Musculoskeletal: Negative for falls and myalgias.  Skin: Positive for itching/rash Neurological: Negative for focal weakness, focal sensory changes and loss of consciousness.  Endo/Heme/Allergies: Does bruise/bleed easily.  All other ROS reviewed and negative.     Physical Exam/Data:   Vitals:   03/06/19 1430 03/06/19 1445 03/06/19 1515 03/06/19 1530  BP: 125/68 114/82 131/73 139/79  Pulse: 95 94 94 87  Resp: 19 18 20 19   Temp:      TempSrc:      SpO2: 98% 98% 96% 98%  Weight:       No intake or output data in the 24 hours ending 03/06/19 1718 Last 3 Weights 03/06/2019 02/06/2019 02/02/2019  Weight (lbs) 125 lb 117 lb 8 oz 120 lb  Weight (kg) 56.7 kg 53.298 kg 54.432 kg     Body mass index is 21.12 kg/m.  General:  Well nourished, well developed, in no acute distress. Covered in blankets, [place] in place  HEENT: normal, [place] in place Neck: no JVD Endocrine:  No thryomegaly Vascular: No carotid bruits; RA pulses 2+ bilaterally Cardiac:  normal S1, S2; RRR; no murmur Lungs:  clear to auscultation except for diminished breath sounds at bilateral bases Abd: soft, nontender, no hepatomegaly  Ext: no edema Musculoskeletal:  No deformities, BUE and BLE strength normal and equal Skin: diffuse rash, see images in H&P Neuro:  no focal abnormalities noted Psych:  Normal affect   EKG:  The EKG was personally reviewed and demonstrates:  Sinus tachycardia Telemetry:  Telemetry was personally reviewed and demonstrates:  Currently NSR from sinus tach  Relevant CV Studies: Echo 03/06/19  1. Left ventricular ejection fraction, by visual estimation, is 60 to 65%. The left ventricle has normal function. Normal left ventricular size. There is no left ventricular hypertrophy.  2. Left ventricular diastolic Doppler parameters are consistent with impaired relaxation pattern of LV diastolic filling.  3. Global right ventricle has mildly reduced systolic function.The right ventricular size is moderately enlarged. No increase in right ventricular wall thickness.  4. Left atrial size was normal.  5. Right atrial size was mildly dilated.  6. Trivial pericardial effusion is present.  7. The mitral valve is normal in structure. Trace mitral valve regurgitation. No evidence of mitral stenosis.  8. The tricuspid valve is normal in structure. Tricuspid valve regurgitation severe.  9. The aortic valve is normal in structure. Aortic valve regurgitation was not visualized by color flow Doppler. Structurally normal aortic valve, with no evidence of sclerosis or stenosis. 10. The pulmonic valve was normal in structure. Pulmonic valve regurgitation is mild by color flow Doppler. 11. Mildly elevated pulmonary artery systolic pressure. 12. The inferior vena cava is normal in size with greater than 50% respiratory variability,  suggesting right atrial pressure of 3 mmHg.  Laboratory Data:  High Sensitivity Troponin:  Recent Labs  Lab 03/06/19 0158 03/06/19 0524 03/06/19 1133 03/06/19 1436  TROPONINIHS 67* 352* 321* 207*     Chemistry Recent Labs  Lab 03/06/19 0158  NA 139  K 3.5  CL 105  CO2 19*  GLUCOSE 113*  BUN 19  CREATININE 0.79  CALCIUM 9.3  GFRNONAA >60  GFRAA >60  ANIONGAP 15    Recent Labs  Lab 03/06/19 0158  PROT 6.0*  ALBUMIN 3.4*  AST 21  ALT 11  ALKPHOS 85  BILITOT 0.4   Hematology Recent Labs  Lab 03/06/19 0158  WBC 8.3  RBC 4.91  HGB 14.0  HCT 45.3  MCV 92.3  MCH 28.5  MCHC 30.9  RDW 14.4  PLT 72*   BNPNo results for input(s): BNP, PROBNP in the last 168 hours.  DDimer No results for input(s): DDIMER in the last 168 hours.   Radiology/Studies:  Ct Angio Chest Pe W And/or Wo Contrast  Addendum Date: 03/06/2019   ADDENDUM REPORT: 03/06/2019 09:17 ADDENDUM: Comment: No dominant thyroid lesions. Electronically Signed   By: Lowella Grip III M.D.   On: 03/06/2019 09:17   Result Date: 03/06/2019 CLINICAL DATA:  Shortness of breath.  History of breast carcinoma EXAM: CT ANGIOGRAPHY CHEST WITH CONTRAST TECHNIQUE: Multidetector CT imaging of the chest was performed using the standard protocol during bolus administration of intravenous contrast. Multiplanar CT image reconstructions and MIPs were obtained to evaluate the vascular anatomy. CONTRAST:  165mL OMNIPAQUE IOHEXOL 350 MG/ML SOLN COMPARISON:  February 27, 2019 CT chest, abdomen, and pelvis; chest CT September 06, 2011 FINDINGS: Cardiovascular: There are pulmonary emboli arising from the distal most aspect of the left main pulmonary artery with extension primarily into left lower lobe pulmonary arterial branches. There is incompletely obstructing pulmonary embolus as well in the left upper lobe pulmonary artery proximally. There is more peripheral pulmonary embolus in the right lower lobe region. The right ventricle  to left ventricle diameter ratio is 1.6, indicative of right heart strain. There is no appreciable thoracic aortic aneurysm or dissection. The visualized great vessels show patchy areas of atherosclerotic calcification. There is extensive aortic atherosclerosis as well as scattered foci of coronary artery calcification. There is no pericardial effusion or pericardial thickening. Mediastinum/Nodes: There are multiple lymph nodes throughout the axillary and subpectoral regions. Most of these lymph nodes are subcentimeter. Several lymph nodes are borderline prominent, unchanged from 1 week prior. There are several prominent lymph nodes surrounding the mid to distal trachea, largest measuring 1.2 x 1.1 cm. There is no new adenopathy compared to 1 week prior. No esophageal lesions are evident. Lungs/Pleura: There is a large pleural effusion on the right with a much smaller pleural effusion on the left. There is consolidation, largely due to compressive atelectasis throughout the right lower lobe. There is milder compressive atelectasis in the left base. There is a nodular opacity abutting the pleura in the anterior segment of the right upper lobe measuring 1.1 x 0.6 cm. A nodular opacity abutting the pleura in the right middle lobe measures 7 mm, better seen on recent prior study. Upper Abdomen: In the visualized upper abdomen, there is aortic atherosclerosis. Spleen is incompletely visualized but appears enlarged. Spleen is noted to be enlarged on recent CT abdomen study. There appears to be adenopathy in the upper retroperitoneum, incompletely visualized. Musculoskeletal: There is degenerative change in the thoracic spine. No blastic or lytic bone lesions are evident. A bone island is noted in the T4 vertebral body, stable. Postoperative change in  the right breast region noted. Review of the MIP images confirms the above findings. IMPRESSION: 1. Pulmonary embolus arising from the distal left main pulmonary artery with  extension into multiple left lower lobe pulmonary arterial branches. There is also incompletely obstructing pulmonary embolus in the proximal left upper lobe pulmonary artery. More distal pulmonary embolus in the right lower lobe pulmonary artery. Positive for acute PE with CT evidence of right heart strain (RV/LV Ratio = 1.6) consistent with at least submassive (intermediate risk) PE. The presence of right heart strain has been associated with an increased risk of morbidity and mortality. Please activate Code PE by paging [phone]. 2. Sizable pleural effusion on the right with smaller pleural effusion on the left. Consolidation and compressive atelectasis throughout most of the right lower lobe. Milder atelectasis left base. Nodular opacities on the right, likely metastatic foci. 3.  Stable adenopathy compared to 1 week prior. 4. Enlargement of spleen, incompletely visualized but documented CT 1 week prior. Concern for potential lymphoma. 5. Aortic atherosclerosis. No aneurysm or dissection evident. Foci of coronary artery calcification noted. Aortic Atherosclerosis (ICD10-I70.0). Critical Value/emergent results were called by telephone at the time of interpretation on 03/06/2019 at 9:12 am to provider Dr. Ronnald Nian, ED physician, who verbally acknowledged these results. Electronically Signed: By: Lowella Grip III M.D. On: 03/06/2019 09:13   Dg Chest Port 1 View  Result Date: 03/06/2019 CLINICAL DATA:  Shortness of breath. EXAM: PORTABLE CHEST 1 VIEW COMPARISON:  CT 01/04/2019.  Chest x-ray 03/06/2019. FINDINGS: Cardiomegaly. No pulmonary venous congestion. Mild right base infiltrate. Bilateral pleural effusions. Right pleural effusion has improved from prior exam. No pneumothorax. Surgical clips over the right chest. IMPRESSION: 1.  Cardiomegaly.  No pulmonary venous congestion. 2. Mild right base infiltrate and bilateral pleural effusions again noted. Right pleural effusion has improved from prior  exam. Electronically Signed   By: Marcello Moores  Register   On: 03/06/2019 12:38   Dg Chest Portable 1 View  Result Date: 03/06/2019 CLINICAL DATA:  85 year old female with shortness of breath. Recently diagnosed with lymphoma. EXAM: PORTABLE CHEST 1 VIEW COMPARISON:  CT Chest, Abdomen, and Pelvis 02/27/2019. FINDINGS: Portable AP semi upright view at 0208 hours. Moderate size right pleural effusion and small left pleural effusion appear not significantly changed. Stable cardiac size and mediastinal contours. Visualized tracheal air column is within normal limits. No pulmonary edema in the upper lungs. No pneumothorax or other confluent pulmonary opacity. No acute osseous abnormality identified. IMPRESSION: 1. Moderate size right and small left pleural effusions appear not significantly changed since 02/27/19. 2. No new cardiopulmonary abnormality. Electronically Signed   By: Genevie Ann M.D.   On: 03/06/2019 02:20    Assessment and Plan:   Pulmonary embolism, bilateral:  CT with evidence of R heart involvement by ratio. Echo with mildly reduced function and moderate enlargement. Hemodynamically stable and no longer tachycardic, saturating well on [place]. -hsTn trend 67->352->321->207. This is completely possible in the setting of large PE. No ECG changes, no chest pain, no wall motion abnormalities on echo to suggest ACS. Consistent with demand ischemia - multiple complex medical issues, including possible lymphoma, thrombocytopenia, large PE -no indication for cath at this time -primary team managing anticoagulation. Difficult given possible malignancy, thrombocytopenia  [facility] will sign off.   Follow up as an outpatient:  Please contact us when she is approaching discharge, and we will be happy to arrange a follow up with our office.  For questions or updates, please contact [place] Please  consult [email or web address] for contact info under   Signed, Buford Dresser, MD  03/06/2019 5:18 PM

## 2019-03-06 NOTE — ED Notes (Signed)
Pt's DNR verified. DNR arm band placed on pt's left wrist.

## 2019-03-06 NOTE — Progress Notes (Signed)
Kevin for heparin Indication: pulmonary embolus  Allergies  Allergen Reactions  . Codeine Other (See Comments)    Unknown reaction  . Thiazide-Type Diuretics Other (See Comments)    Blurry vision    Patient Measurements: Weight: 125 lb (56.7 kg) Heparin Dosing Weight: TBW  Vital Signs: BP: 127/82 (10/15 2030) Pulse Rate: 92 (10/15 2030)  Labs: Recent Labs    03/06/19 0158 03/06/19 0524 03/06/19 1133 03/06/19 1436 03/06/19 1903  HGB 14.0  --   --   --   --   HCT 45.3  --   --   --   --   PLT 72*  --   --   --   --   HEPARINUNFRC  --   --   --   --  0.10*  CREATININE 0.79  --   --   --   --   TROPONINIHS 67* 352* 321* 207*  --     Estimated Creatinine Clearance: 46.2 mL/min (by C-G formula based on SCr of 0.79 mg/dL).   Medical History: Past Medical History:  Diagnosis Date  . Allergy    codeine, thiazides  . Anxiety    new dx  . Arthritis   . Breast cancer ([place]) 03/14/12   bx=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,ER/PR=+,upper inner quad  . Cancer ([place])    breast  . Cataract   . Glaucoma    laser treated years ago  . HOH (hard of hearing)   . Hyperlipemia   . Hypertension   . Hypothyroidism   . Pancreatic cyst    benign  . PONV (postoperative nausea and vomiting)   . Radiation 06/11/2012-07/12/2012   17 sessions 4250 cGy, 3 sessions 750 cGy  . Vertigo   . Wears glasses   . Wears partial dentures    partial upper   Assessment: 6 YOF presenting with SOB, concern for PE, not on anticoagulation PTA.  CBC wnl.    Goal of Therapy:  Heparin level 0.3-0.7 units/ml Monitor platelets by anticoagulation protocol: Yes   Plan:  - Heparin drip was stopped for ~ 30 min at 1200 10/15 for a procedure - Heparin level still sub-therapeutic  - Will bolus with Heparin 2000 units  - Will increase the heparin drip to 1150 units/hr - F/u 8 hour heparin level  - Daily heparin level, CBC, s/s  bleeding  Duanne Limerick, PharmD, BCPS  Clinical Pharmacist Please check AMION for all [place] numbers 03/06/2019 8:32 PM

## 2019-03-06 NOTE — ED Triage Notes (Signed)
Pt came in EMS c/o SOB. Patient felt SOB while sitting at her desk at home. EMS arrival SpO2 low 80%. 110HR, 94% 3Lo2, 124/79, 134CBG.   Recently diagnosed with lymphoma. Had a big day today went to two doctors appointment. EMS stated Right Lower Lobe is diminished,

## 2019-03-06 NOTE — Procedures (Signed)
Thoracentesis Procedure Note  Pre-operative Diagnosis: Pleural effusion  Post-operative Diagnosis: same  Indications: Effusion, possible cancer  Procedure Details  Consent: Informed consent was obtained. Risks of the procedure were discussed including: infection, bleeding, pain, pneumothorax.  Under sterile conditions the patient was positioned. Betadine solution and sterile drapes were utilized.  1% buffered lidocaine was used to anesthetize posterior right back over area of effusion identified with [place]. Fluid was obtained without any difficulties and minimal blood loss.  A dressing was applied to the wound and wound care instructions were provided.   Findings 1200 ml of cloudy pleural fluid was obtained. A sample was sent to Pathology for cytogenetics, flow, and cell counts, as well as for infection analysis.  Complications:  None; patient tolerated the procedure well.          Condition: stable  CXR pending

## 2019-03-06 NOTE — Procedures (Signed)
Came bedside for stat echo, but Dr. Tamala Julian has requested that echo be completed at a later time.

## 2019-03-06 NOTE — Progress Notes (Signed)
ANTICOAGULATION CONSULT NOTE - Initial Consult  Pharmacy Consult for heparin Indication: pulmonary embolus  Allergies  Allergen Reactions  . Codeine Other (See Comments)    Unknown reaction  . Thiazide-Type Diuretics Other (See Comments)    Blurry vision    Patient Measurements: Weight: 125 lb (56.7 kg) Heparin Dosing Weight: TBW  Vital Signs: Temp: 97.4 F (36.3 C) (10/15 0102) Temp Source: Oral (10/15 0102) BP: 125/80 (10/15 0715) Pulse Rate: 102 (10/15 0715)  Labs: Recent Labs    03/06/19 0158 03/06/19 0524  HGB 14.0  --   HCT 45.3  --   PLT 72*  --   CREATININE 0.79  --   TROPONINIHS 67* 352*    Estimated Creatinine Clearance: 46.2 mL/min (by C-G formula based on SCr of 0.79 mg/dL).   Medical History: Past Medical History:  Diagnosis Date  . Allergy    codeine, thiazides  . Anxiety    new dx  . Arthritis   . Breast cancer ([place]) 03/14/12   bx=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,ER/PR=+,upper inner quad  . Cancer ([place])    breast  . Cataract   . Glaucoma    laser treated years ago  . HOH (hard of hearing)   . Hyperlipemia   . Hypertension   . Hypothyroidism   . Pancreatic cyst    benign  . PONV (postoperative nausea and vomiting)   . Radiation 06/11/2012-07/12/2012   17 sessions 4250 cGy, 3 sessions 750 cGy  . Vertigo   . Wears glasses   . Wears partial dentures    partial upper   Assessment: 67 YOF presenting with SOB, concern for PE, not on anticoagulation PTA.  CBC wnl.    Goal of Therapy:  Heparin level 0.3-0.7 units/ml Monitor platelets by anticoagulation protocol: Yes   Plan:  Heparin 3000 units IV x1, and gtt at 950 units/hr F/u 8 hour heparin level Daily heparin level, CBC, s/s bleeding  Bertis Ruddy, PharmD Clinical Pharmacist Please check AMION for all [place] numbers 03/06/2019 7:42 AM

## 2019-03-06 NOTE — ED Notes (Signed)
Dinner tray ordered.

## 2019-03-06 NOTE — ED Provider Notes (Addendum)
[facility] Provider Note   CSN: [id number] Arrival date & time: 03/06/19  0100     History   Chief Complaint Chief Complaint  Patient presents with  . Shortness of Breath    HPI Sarah [name] is a 85 y.o. female.     HPI  85 year old female with a history of breast [name], Sarah [name], Sarah [name], Sarah history for likely lymphoma who presents with shortness of breath.  Patient reports that she was sitting at her desk when she began to feel racing in her chest.  She did not have any chest pain.  She states "I felt like my heart was going to come out of my chest."  At that time she had shortness of breath.  No Sarah fevers.  She does have a chronic dry cough over the last month.  Her daughter reports that she tested negative for Covid 2 to 3 weeks ago.  She recently had a chest x-ray and subsequent CT scans which were concerning for possible lymphoma.  She has not had a pathology diagnosis yet.  She denies Covid contacts.  She does report bilateral lower extremity swelling.  Left arm swelling.  Past Medical History:  Diagnosis Date  . Allergy    codeine, thiazides  . Anxiety    new dx  . Arthritis   . Breast [name] ([place]) 03/14/12   bx=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,ER/PR=+,upper inner quad  . [name] ([place])    breast  . Cataract   . Glaucoma    laser treated years ago  . HOH (hard of hearing)   . Hyperlipemia   . Sarah [name]   . Hypothyroidism   . Pancreatic cyst    benign  . PONV (postoperative nausea and vomiting)   . Radiation 06/11/2012-07/12/2012   17 sessions 4250 cGy, 3 sessions 750 cGy  . Vertigo   . Wears glasses   . Wears partial dentures    partial upper    Patient Active Problem List   Diagnosis Date Noted  . Pruritus 02/06/2019  . Rash and other nonspecific skin eruption 02/06/2019  . Angio-edema 02/06/2019  . Shortness of breath 02/06/2019  . Osteopenia 12/18/2013  .  Heart block AV first degree 12/18/2013  . Breast [name] of upper-inner quadrant of right female breast ([place]) 03/19/2012  . Right knee DJD 08/18/2011  . Hypothyroidism   . Sarah [name]   . PONV (postoperative nausea and vomiting)   . Hyperlipemia     Past Surgical History:  Procedure Laterality Date  . ABDOMINAL HYSTERECTOMY  1966   1/2 ovary left in   . BREAST SURGERY  1992   lumpectomy-lt  . CATARACT EXTRACTION     b/l  . COLONOSCOPY    . EYE SURGERY     bilateral cataract removal  . JOINT REPLACEMENT  2013   rt total knee  . JOINT REPLACEMENT  1995   lt total knee  . PARTIAL MASTECTOMY WITH NEEDLE LOCALIZATION  04/09/2012   Procedure: PARTIAL MASTECTOMY WITH NEEDLE LOCALIZATION;  Surgeon: Adin Hector, MD;  Location: [facility];  Service: General;  Laterality: Right;  . TONSILLECTOMY    . TOTAL KNEE ARTHROPLASTY  08/16/2011   Procedure: TOTAL KNEE ARTHROPLASTY;  Surgeon: Ninetta Lights, MD;  Location: [facility];  Service: Orthopedics;  Laterality: Right;     OB History   No obstetric history on file.      Home Medications    Prior to Admission medications  Medication Sig Start Date End Date Taking? Authorizing Provider  amLODipine (NORVASC) 2.5 MG tablet Take 2.5-5 mg by mouth See admin instructions. 5 mg in the morning, 2.5 mg in the evening 12/21/18   [provider]  Calcium Carbonate (CALCIUM 600 PO) Take 600 mg by mouth daily.    [provider]  cetirizine (ZYRTEC) 10 MG tablet Take 10 mg by mouth 2 (two) times daily.  01/23/19   [provider]  Cholecalciferol (VITAMIN D) 50 MCG (2000 UT) tablet Take 2,000 Units by mouth 2 (two) times daily.    [provider]  Cyanocobalamin (VITAMIN B-12 PO) Take 3,000 mcg by mouth daily.     [provider]  hydrOXYzine (ATARAX/VISTARIL) 10 MG tablet Take 1 tablet (10 mg total) by mouth at bedtime as needed for itching. Patient not taking: Reported on 03/05/2019  02/06/19   Garnet Sierras, DO  Krill Oil (OMEGA-3) 500 MG CAPS Take 500 mg by mouth daily.    [provider]  levothyroxine (SYNTHROID) 25 MCG tablet Take 25 mcg by mouth daily before breakfast.  09/20/18   [provider]  Multiple Vitamins-Minerals (PRESERVISION AREDS PO) Take 1 capsule by mouth 2 (two) times daily.     [provider]  olopatadine (PATANOL) 0.1 % ophthalmic solution Place 1 drop into both eyes 2 (two) times daily.    [provider]  triamcinolone cream (KENALOG) 0.1 % Apply 1 application topically 2 (two) times daily. 02/26/19   [provider]  vitamin E 400 UNIT capsule Take 400 Units by mouth daily.    [provider]  zolpidem (AMBIEN) 10 MG tablet Take 2.5 mg by mouth at bedtime.  07/24/14   [provider]    Family History Family History  Problem Relation Age of Onset  . Heart disease Father   . [name] Maternal Aunt        stomach    Social History Social History   Tobacco Use  . Smoking status: Never Smoker  . Smokeless tobacco: Never Used  Substance Use Topics  . Alcohol use: No  . Drug use: No     Allergies   Codeine and Thiazide-type diuretics   Review of Systems Review of Systems  Constitutional: Negative for fever.  Respiratory: Positive for cough and shortness of breath.   Cardiovascular: Positive for palpitations and leg swelling. Negative for chest pain.  Gastrointestinal: Negative for abdominal pain, nausea and vomiting.  Genitourinary: Negative for dysuria.  All other systems reviewed and are negative.    Physical Exam Updated Vital Signs BP 125/80   Pulse (!) 102   Temp (!) 97.4 F (36.3 C) (Oral)   Resp 19   Wt 56.7 kg   SpO2 96%   BMI 21.12 kg/m   Physical Exam Vitals signs and nursing note reviewed.  Constitutional:      Appearance: She is well-developed.     Comments: Elderly, nontoxic-appearing  HENT:     Head: Normocephalic and atraumatic.  Eyes:      Pupils: Pupils are equal, round, and reactive to light.  Neck:     Musculoskeletal: Neck supple.  Cardiovascular:     Rate and Rhythm: Regular rhythm. Tachycardia present.     Heart sounds: Normal heart sounds.  Pulmonary:     Effort: Pulmonary effort is normal. No respiratory distress.     Breath sounds: No wheezing.  Abdominal:     General: Bowel sounds are normal.     Palpations:  Abdomen is soft.  Musculoskeletal:     Right lower leg: She exhibits no tenderness. Edema present.     Left lower leg: She exhibits no tenderness. Edema present.  Skin:    General: Skin is warm and dry.  Neurological:     Mental Status: She is alert and oriented to person, place, and time.  Psychiatric:        Mood and Affect: Mood normal.      ED Treatments / Results  Labs (all labs ordered are listed, but only abnormal results are displayed) Labs Reviewed  CBC WITH DIFFERENTIAL/PLATELET - Abnormal; Notable for the following components:      Result Value   Platelets 72 (*)    Eosinophils Absolute 0.6 (*)    Abs Immature Granulocytes 0.08 (*)    All other components within normal limits  COMPREHENSIVE METABOLIC PANEL - Abnormal; Notable for the following components:   CO2 19 (*)    Glucose, Bld 113 (*)    Total Protein 6.0 (*)    Albumin 3.4 (*)    All other components within normal limits  TROPONIN I (HIGH SENSITIVITY) - Abnormal; Notable for the following components:   Troponin I (High Sensitivity) 67 (*)    All other components within normal limits  TROPONIN I (HIGH SENSITIVITY) - Abnormal; Notable for the following components:   Troponin I (High Sensitivity) 352 (*)    All other components within normal limits  SARS CORONAVIRUS 2 (TAT 6-24 HRS)  HEPARIN LEVEL (UNFRACTIONATED)    EKG EKG Interpretation  Date/Time:  Thursday March 06 2019 01:02:41 EDT Ventricular Rate:  115 PR Interval:    QRS Duration: 90 QT Interval:  333 QTC Calculation: 461 R Axis:   -75 Text  Interpretation:  Sinus tachycardia Left anterior fascicular block Anterior infarct, old Confirmed by Thayer Jew [phone]) on 03/06/2019 7:21:56 AM   Radiology Dg Chest Portable 1 View  Result Date: 03/06/2019 CLINICAL DATA:  85 year old female with shortness of breath. Recently diagnosed with lymphoma. EXAM: PORTABLE CHEST 1 VIEW COMPARISON:  CT Chest, Abdomen, and Pelvis 02/27/2019. FINDINGS: Portable AP semi upright view at 0208 hours. Moderate size right pleural effusion and small left pleural effusion appear not significantly changed. Stable cardiac size and mediastinal contours. Visualized tracheal air column is within normal limits. No pulmonary edema in the upper lungs. No pneumothorax or other confluent pulmonary opacity. No acute osseous abnormality identified. IMPRESSION: 1. Moderate size right and small left pleural effusions appear not significantly changed since 02/27/19. 2. No new cardiopulmonary abnormality. Electronically Signed   By: Genevie Ann M.D.   On: 03/06/2019 02:20    Procedures Procedures (including critical care time)  CRITICAL CARE Performed by: Merryl Hacker   Total critical care time: 35 minutes  Critical care time was exclusive of separately billable procedures and treating other patients.  Critical care was necessary to treat or prevent imminent or life-threatening deterioration.  Critical care was time spent personally by me on the following activities: development of treatment plan with patient and/or surrogate as well as nursing, discussions with consultants, evaluation of patient's response to treatment, examination of patient, obtaining history from patient or surrogate, ordering and performing treatments and interventions, ordering and review of laboratory studies, ordering and review of radiographic studies, pulse oximetry and re-evaluation of patient's condition.   Angiocath insertion Performed by: Merryl Hacker  Consent: Verbal consent  obtained. Risks and benefits: risks, benefits and alternatives were discussed Time out: Immediately prior to procedure a "  time out" was called to verify the correct patient, procedure, equipment, support staff and site/side marked as required.  Preparation: Patient was prepped and draped in the usual sterile fashion.  Vein Location: left basilic  Ultrasound Guided  Gauge: 20  Normal blood return and flush without difficulty Patient tolerance: Patient tolerated the procedure well with no immediate complications.     Medications Ordered in ED Medications  acetaminophen (TYLENOL) tablet 650 mg (has no administration in time range)  heparin bolus via infusion 3,000 Units (has no administration in time range)  heparin ADULT infusion 100 units/mL (25000 units/241mL sodium chloride 0.45%) (has no administration in time range)     Initial Impression / Assessment and Plan / ED Course  I have reviewed the triage vital signs and the nursing notes.  Pertinent labs & imaging results that were available during my care of the patient were reviewed by me and considered in my medical decision making (see chart for details).        Patient presents with palpitations and shortness of breath.  She is overall nontoxic-appearing.  She is tachycardic.  She is satting 95% on 2 L of nasal cannula started by EMS.  She does not normally wear oxygen.  [facility] presumed diagnosis of lymphoma pending biopsy.  EKG shows no evidence of arrhythmia or ischemia.  She is in sinus tachycardia.  Chest x-ray shows stable pleural effusions.  Given Sarah likely [name] diagnosis, she would be at risk for PE.  She did just have a CT scan on October 8; however, could have certainly developed a PE since that time.  She also has pleural effusions.  Patient had a significant delay in her lab work.  Initial troponin was slightly elevated in the 60s.  Creatinine reviewed and allows her to have a CT.  Will repeat CT PE protocol.   Fortunately, patient was difficult IV access.  I placed a an ultrasound-guided IV for PE.  Repeat troponin significantly elevated.  She will need admission but I still am concerned that she may have a PE that is causing heart strain versus true ACS.  Patient started on heparin.  Will plan for admission to the hospital after her CT scan.  She was discussed with oncoming physician who will get her admitted after her CT.  Final Clinical Impressions(s) / ED Diagnoses   Final diagnoses:  Elevated troponin  Shortness of breath    ED Discharge Orders    None       , Barbette Hair, MD 03/06/19 [id number]    Merryl Hacker, MD 03/06/19 [phone]

## 2019-03-06 NOTE — ED Provider Notes (Signed)
Patient with history of hypertension, high cholesterol, possibly new lymphoma who presents to the ED with shortness of breath, palpitations.  Patient handed off to me at 8 AM with patient awaiting CT scan of her chest to evaluate for PE.  Patient with elevated troponin but no chest pain, EKG with no ischemic changes.  Has been tachycardic in a sinus rhythm.  Concern for possible PE.  Empirically started on heparin.  Otherwise lab work fairly unremarkable.  Patient with unchanged pleural effusions on chest x-ray.  Awaiting CT scan of chest and then anticipate admission for further care given elevated troponin.  Coronavirus test is negative.  Radiology called me on the phone to state that patient does have acute pulmonary embolism.  Has some mild heart strain.  Does have elevated troponin.  However patient otherwise is hemodynamically stable.  Patient is already started on heparin.  Has ongoing adenopathy and likely has underlying lymphoma that is currently being worked up.  Will talk with pulmonology and admit patient to hospitalist service for further care.  This chart was dictated using voice recognition software.  Despite best efforts to proofread,  errors can occur which can change the documentation meaning.     Lennice Sites, DO 03/06/19 [phone]

## 2019-03-06 NOTE — Progress Notes (Signed)
  Echocardiogram 2D Echocardiogram has been performed.  Sarah [name] 03/06/2019, 2:14 PM

## 2019-03-07 ENCOUNTER — Other Ambulatory Visit: Payer: Self-pay

## 2019-03-07 ENCOUNTER — Inpatient Hospital Stay (HOSPITAL_COMMUNITY): Payer: Medicare Other

## 2019-03-07 ENCOUNTER — Telehealth: Payer: Self-pay

## 2019-03-07 DIAGNOSIS — I2609 Other pulmonary embolism with acute cor pulmonale: Secondary | ICD-10-CM

## 2019-03-07 DIAGNOSIS — E876 Hypokalemia: Secondary | ICD-10-CM

## 2019-03-07 DIAGNOSIS — I2699 Other pulmonary embolism without acute cor pulmonale: Secondary | ICD-10-CM

## 2019-03-07 DIAGNOSIS — D696 Thrombocytopenia, unspecified: Secondary | ICD-10-CM

## 2019-03-07 DIAGNOSIS — R05 Cough: Secondary | ICD-10-CM

## 2019-03-07 LAB — PH, BODY FLUID: pH, Body Fluid: 7.9

## 2019-03-07 LAB — BASIC METABOLIC PANEL
Anion gap: 11 (ref 5–15)
BUN: 17 mg/dL (ref 8–23)
CO2: 23 mmol/L (ref 22–32)
Calcium: 8.6 mg/dL — ABNORMAL LOW (ref 8.9–10.3)
Chloride: 106 mmol/L (ref 98–111)
Creatinine, Ser: 0.76 mg/dL (ref 0.44–1.00)
GFR calc Af Amer: >60 mL/min (ref >60)
GFR calc non Af Amer: >60 mL/min (ref >60)
Glucose, Bld: 142 mg/dL — ABNORMAL HIGH (ref 70–99)
Potassium: 3.1 mmol/L — ABNORMAL LOW (ref 3.5–5.1)
Sodium: 140 mmol/L (ref 135–145)

## 2019-03-07 LAB — CBC
HCT: 39.5 % (ref 36.0–46.0)
Hemoglobin: 13.1 g/dL (ref 12.0–15.0)
MCH: 29.2 pg (ref 26.0–34.0)
MCHC: 33.2 g/dL (ref 30.0–36.0)
MCV: 88.2 fL (ref 80.0–100.0)
Platelets: 214 10*3/uL (ref 150–400)
RBC: 4.48 MIL/uL (ref 3.87–5.11)
RDW: 14.2 % (ref 11.5–15.5)
WBC: 8.4 10*3/uL (ref 4.0–10.5)
nRBC: 0 % (ref 0.0–0.2)

## 2019-03-07 LAB — MAGNESIUM: Magnesium: 2.1 mg/dL (ref 1.7–2.4)

## 2019-03-07 LAB — HEPARIN LEVEL (UNFRACTIONATED): Heparin Unfractionated: 0.21 IU/mL — ABNORMAL LOW (ref 0.30–0.70)

## 2019-03-07 LAB — CYTOLOGY - NON PAP

## 2019-03-07 LAB — FLOW CYTOMETRY REQUEST - FLUID (INPATIENT)

## 2019-03-07 MED ORDER — POTASSIUM CHLORIDE CRYS ER 20 MEQ PO TBCR
40.0000 meq | EXTENDED_RELEASE_TABLET | ORAL | Status: AC
  Administered 2019-03-07 (×2): 40 meq via ORAL
  Filled 2019-03-07 (×2): qty 2

## 2019-03-07 MED ORDER — FLUTICASONE FUROATE-VILANTEROL 100-25 MCG/INH IN AEPB: 1.0000 | INHALATION_SPRAY | Freq: Every day | RESPIRATORY_TRACT | Status: DC

## 2019-03-07 MED ORDER — BENZONATATE 100 MG PO CAPS
100.0000 mg | ORAL_CAPSULE | Freq: Three times a day (TID) | ORAL | Status: DC
  Administered 2019-03-07 (×2): 100 mg via ORAL
  Filled 2019-03-07 (×2): qty 1

## 2019-03-07 MED ORDER — HEPARIN (PORCINE) 25000 UT/250ML-% IV SOLN
1250.0000 [IU]/h | INTRAVENOUS | Status: AC
  Administered 2019-03-07: 1250 [IU]/h via INTRAVENOUS

## 2019-03-07 MED ORDER — ENOXAPARIN SODIUM 60 MG/0.6ML ~~LOC~~ SOLN
60.0000 mg | Freq: Two times a day (BID) | SUBCUTANEOUS | Status: DC
  Administered 2019-03-07 – 2019-03-08 (×2): 60 mg via SUBCUTANEOUS
  Filled 2019-03-07 (×2): qty 0.6

## 2019-03-07 MED ORDER — FLUTICASONE PROPIONATE 50 MCG/ACT NA SUSP
1.0000 | Freq: Every day | NASAL | Status: DC
  Administered 2019-03-07 – 2019-03-08 (×2): 1 via NASAL
  Filled 2019-03-07: qty 16

## 2019-03-07 MED ORDER — FLUTICASONE FUROATE-VILANTEROL 100-25 MCG/INH IN AEPB: 1.0000 | INHALATION_SPRAY | Freq: Every day | RESPIRATORY_TRACT | 0 refills | Status: DC

## 2019-03-07 MED ORDER — FLUTICASONE FUROATE-VILANTEROL 100-25 MCG/INH IN AEPB
1.0000 | INHALATION_SPRAY | Freq: Every day | RESPIRATORY_TRACT | Status: DC
  Administered 2019-03-08: 1 via RESPIRATORY_TRACT
  Filled 2019-03-07: qty 28

## 2019-03-07 MED ORDER — HEPARIN BOLUS VIA INFUSION
850.0000 [IU] | Freq: Once | INTRAVENOUS | Status: AC
  Administered 2019-03-07: 850 [IU] via INTRAVENOUS
  Filled 2019-03-07: qty 850

## 2019-03-07 MED ORDER — ENOXAPARIN SODIUM 60 MG/0.6ML ~~LOC~~ SOLN: 60.0000 mg | Freq: Two times a day (BID) | SUBCUTANEOUS | 0 refills | Status: DC

## 2019-03-07 MED FILL — BREO ELLIPTA 100-25 MCG INH: 100-25 | 30 days supply | Qty: 60 | Fill #0

## 2019-03-07 MED FILL — ENOXAPARIN SODIUM 60 MG/0.6: 60 | 14 days supply | Qty: 17 | Fill #0

## 2019-03-07 NOTE — TOC Progression Note (Signed)
Transition of Care [facility]) - Progression Note    Patient Details  Name: Sarah [name] MRN: [id number] Date of Birth: August 09, 1932  Transition of Care [facility]) CM/SW Contact  Sharin Mons, RN Phone Number: [phone] 03/07/2019, 2:22 PM  Clinical Narrative:     NCM received consult :What is patient coverage/co-pay for lovenox 60 mg sq q12h x 14 days ( 28 syringes)?  Per [facility] pharmacist cost will be $ 8.00.    Whitman Hero RN,BSN,CM  Expected Discharge Plan and Services     Social Determinants of Health (SDOH) Interventions    Readmission Risk Interventions No flowsheet data found.

## 2019-03-07 NOTE — Progress Notes (Signed)
Bilateral lower extremity venous duplex has been completed. Preliminary results can be found in CV Proc through chart review.  Results were given to the patient's nurse, [place].  03/07/19 11:16 AM Carlos Levering RVT

## 2019-03-07 NOTE — Progress Notes (Signed)
PROGRESS NOTE  Sarah [name] QHU:[id number] DOB: [birth year]   PCP: Lajean Manes, MD  Patient is from: Home  DOA: 03/06/2019 LOS: 1  Brief Narrative / Interim history: 85 year old female with history of hypothyroidism, HTN, HLD and remote breast cancer (2013) in remission presenting with acute shortness of breath, leg swelling, left hand swelling and some weight gain despite poor appetite.   In ED, CTA chest revealed at least submassive PE, moderate right pleural effusion, diffuse LAD concerning for lymphoma.  Started on heparin drip.  PCCM consulted for guidance.  She had thoracocentesis with removal of 1200 cc cloudy exudative pleural fluid.    Of note, patient had CT neck, chest/abdomen/pelvis on 02/27/2019 that revealed extensive diffuse LAD, new subpleural pulmonary nodules and 56mm oropharyngeal mass.  There was concern about lymphoma and she was referred to ENT, Dr. Wellington Hampshire for lymph node biopsy which was scheduled for 1017/20.   Cardiology consulted for elevated which was thought to be demand ischemia from right heart strain in the setting of PE.  Subjective: No major events overnight of this morning.  Feels better this morning.  Denies chest pain, dyspnea, palpitation, dizziness, GI or UTI symptoms.  Daughter at bedside asked a lot of questions about PE, treatment course and plan for biopsy.   Objective: Vitals:   03/07/19 0749 03/07/19 0813 03/07/19 1145 03/07/19 1213  BP:  130/77  129/81  Pulse:  86  91  Resp:  (!) 25  (!) 21  Temp: 98.3 F (36.8 C)  97.9 F (36.6 C)   TempSrc:   Oral   SpO2:  92%  95%  Weight:        Intake/Output Summary (Last 24 hours) at 03/07/2019 1415 Last data filed at 03/07/2019 1339 Gross per 24 hour  Intake 697.95 ml  Output -  Net 697.95 ml   Filed Weights   03/06/19 0715 03/07/19 0218  Weight: 56.7 kg 56.7 kg    Examination:  GENERAL: No acute distress.  Appears well.  HEENT: MMM.  Vision and hearing grossly intact.   NECK: Supple.  No apparent JVD.  RESP:  No IWOB. Good air movement bilaterally. CVS:  RRR. Heart sounds normal.  ABD/GI/GU: Bowel sounds present. Soft. Non tender.  MSK/EXT:  Moves extremities. No apparent deformity or edema.  SKIN: Diffuse maculopapular rash. NEURO: Awake, alert and oriented appropriately.  No gross deficit.  PSYCH: Calm. Normal affect.   Assessment & Plan: Submassive PE with right heart strain/left lower extremity DVT: Noted on CTA chest.  Echocardiogram with slightly reduced RVSF.  Hemodynamically stable.  Ambulated and maintained good saturation. -Appreciate PCCM guidance-plan to transition to Lovenox -Follow autoimmune labs-ordered by PCCM. -Anticipate discharge on Lovenox on 10/17  Diffuse LAD/diffuse maculopapular rash concerning for lymphoma -PCCM recommendations anticoagulation at least for 2 weeks given submassive PE before biopsy. -PCCM, patient and family to reach out to ENT to coordinate time for lymph node biopsy. -Eventually need oncology follow-up  Pleural effusion: Imaging revealed moderate right-sided pleural effusion and mild left-sided pleural effusion -Ultrasound-guided thoracocentesis of right pleural effusion yielded 1200 cc exudative fluid -Pleural fluid cytology and culture negative. Flow cytometry pending. -Follow systemic inflammatory labs -Patient to follow-up with PCCM outpatient  Subacute cough: she denies reflux or postnasal drainage but admits to some seasonal allergies.  CT neck also revealed 57mm oropharyngeal mass which could be contributing.  -Flonase nasal spray and Tessalon Perles -Follow-up with ENT  Elevated troponin: Likely demand ischemia from right heart strain due to PE.  No anginal symptoms or significant EKG changes. -Cardiology consulted-signed off  Essential hypertension: Normotensive -Continue Norvasc  Hypothyroidism: TSH normal. -Continue home Synthroid  Hypokalemia: -Replenish and recheck. -Check magnesium   History of breast cancer in 2013: Presumably in remission  Thrombocytopenia-likely erroneous lab.  Resolved.  DVT prophylaxis: On Lovenox for PE Code Status: DNR/DNI Family Communication: Updated patient's daughter at bedside. Disposition Plan: Remains inpatient.  Anticipate discharge home on 10/17. Consultants: PCCM  Procedures:  10/18-right-sided thoracocentesis  Microbiology summarized: [id number] negative. Pleural fluid culture negative so far.  Sch Meds:  Scheduled Meds: . amLODipine  2.5 mg Oral QPM  . amLODipine  5 mg Oral q morning - 10a  . docusate sodium  100 mg Oral BID  . enoxaparin (LOVENOX) injection  60 mg Subcutaneous Q12H  . fluticasone furoate-vilanterol  1 puff Inhalation Daily  . levothyroxine  25 mcg Oral QAC breakfast  . loratadine  10 mg Oral Daily  . olopatadine  1 drop Both Eyes BID  . zolpidem  2.5 mg Oral QHS   Continuous Infusions: PRN Meds:.acetaminophen **OR** acetaminophen, ondansetron **OR** ondansetron (ZOFRAN) IV  Antimicrobials: Anti-infectives (From admission, onward)   None       I have personally reviewed the following labs and images: CBC: Recent Labs  Lab 03/06/19 0158 03/07/19 0906  WBC 8.3 8.4  NEUTROABS 6.2  --   HGB 14.0 13.1  HCT 45.3 39.5  MCV 92.3 88.2  PLT 72* 214   BMP &GFR Recent Labs  Lab 03/06/19 0158 03/07/19 0906  NA 139 140  K 3.5 3.1*  CL 105 106  CO2 19* 23  GLUCOSE 113* 142*  BUN 19 17  CREATININE 0.79 0.76  CALCIUM 9.3 8.6*   Estimated Creatinine Clearance: 46.2 mL/min (by C-G formula based on SCr of 0.76 mg/dL). Liver & Pancreas: Recent Labs  Lab 03/06/19 0158  AST 21  ALT 11  ALKPHOS 85  BILITOT 0.4  PROT 6.0*  ALBUMIN 3.4*   No results for input(s): LIPASE, AMYLASE in the last 168 hours. No results for input(s): AMMONIA in the last 168 hours. Diabetic: No results for input(s): HGBA1C in the last 72 hours. No results for input(s): GLUCAP in the last 168 hours. Cardiac  Enzymes: No results for input(s): CKTOTAL, CKMB, CKMBINDEX, TROPONINI in the last 168 hours. No results for input(s): PROBNP in the last 8760 hours. Coagulation Profile: No results for input(s): INR, PROTIME in the last 168 hours. Thyroid Function Tests: Recent Labs    03/06/19 1436  TSH 1.531   Lipid Profile: No results for input(s): CHOL, HDL, LDLCALC, TRIG, CHOLHDL, LDLDIRECT in the last 72 hours. Anemia Panel: No results for input(s): VITAMINB12, FOLATE, FERRITIN, TIBC, IRON, RETICCTPCT in the last 72 hours. Urine analysis:    Component Value Date/Time   COLORURINE YELLOW 04/09/2012 [place] 04/09/2012 1202   LABSPEC 1.011 04/09/2012 1202   PHURINE 7.0 04/09/2012 1202   GLUCOSEU NEGATIVE 04/09/2012 1202   HGBUR NEGATIVE 04/09/2012 1202   BILIRUBINUR NEGATIVE 04/09/2012 1202   KETONESUR NEGATIVE 04/09/2012 1202   PROTEINUR NEGATIVE 04/09/2012 1202   UROBILINOGEN 0.2 04/09/2012 1202   NITRITE NEGATIVE 04/09/2012 1202   LEUKOCYTESUR TRACE (A) 04/09/2012 1202   Sepsis Labs: Invalid input(s): PROCALCITONIN, [place]  Microbiology: Recent Results (from the past 240 hour(s))  SARS CORONAVIRUS 2 (TAT 6-24 HRS) Nasopharyngeal Nasopharyngeal Swab     Status: None   Collection Time: 03/06/19  5:42 AM   Specimen: Nasopharyngeal Swab  Result Value Ref  Range Status   SARS Coronavirus 2 NEGATIVE NEGATIVE Final    Comment: (NOTE) SARS-CoV-2 target nucleic acids are NOT DETECTED. The SARS-CoV-2 RNA is generally detectable in upper and lower respiratory specimens during the acute phase of infection. Negative results do not preclude SARS-CoV-2 infection, do not rule out co-infections with other pathogens, and should not be used as the sole basis for treatment or other patient management decisions. Negative results must be combined with clinical observations, patient history, and epidemiological information. The expected result is Negative. Fact Sheet for  Patients: SugarRoll.be Fact Sheet for Healthcare Providers: https://www.woods-mathews.com/ This test is not yet approved or cleared by the [place] FDA and  has been authorized for detection and/or diagnosis of SARS-CoV-2 by FDA under an Emergency Use Authorization (EUA). This EUA will remain  in effect (meaning this test can be used) for the duration of the COVID-19 declaration under Section 56 4(b)(1) of the Act, 21 U.S.C. section 360bbb-3(b)(1), unless the authorization is terminated or revoked sooner. Performed at [facility], [place] [address] [place], [place] [address]   Body fluid culture (includes gram stain)     Status: None (Preliminary result)   Collection Time: 03/06/19 11:50 AM   Specimen: Pleural Fluid  Result Value Ref Range Status   Specimen Description FLUID PLEURAL  Final   Special Requests NONE  Final   Gram Stain   Final    ABUNDANT WBC PRESENT,BOTH PMN AND MONONUCLEAR NO ORGANISMS SEEN    Culture   Final    NO GROWTH < 24 HOURS Performed at [facility], [place] [address], [place], [place] [address]    Report Status PENDING  Incomplete    Radiology Studies: Vas [place] Lower Extremity Venous (dvt)  Result Date: 03/07/2019  Lower Venous Study Indications: Pulmonary embolism.  Risk Factors: Confirmed PE. Limitations: Poor ultrasound/tissue interface. Comparison Study: No prior studies. Performing Technologist: Oliver Hum RVT  Examination Guidelines: A complete evaluation includes B-mode imaging, spectral Doppler, color Doppler, and power Doppler as needed of all accessible portions of each vessel. Bilateral testing is considered an integral part of a complete examination. Limited examinations for reoccurring indications may be performed as noted.  +---------+---------------+---------+-----------+----------+--------------+ RIGHT    CompressibilityPhasicitySpontaneityPropertiesThrombus Aging  +---------+---------------+---------+-----------+----------+--------------+ CFV      Full           Yes      Yes                                 +---------+---------------+---------+-----------+----------+--------------+ SFJ      Full                                                        +---------+---------------+---------+-----------+----------+--------------+ FV Prox  Full                                                        +---------+---------------+---------+-----------+----------+--------------+ FV Mid   Full                                                        +---------+---------------+---------+-----------+----------+--------------+  FV DistalFull                                                        +---------+---------------+---------+-----------+----------+--------------+ PFV      Full                                                        +---------+---------------+---------+-----------+----------+--------------+ POP      Full           Yes      Yes                                 +---------+---------------+---------+-----------+----------+--------------+ PTV      Full                                                        +---------+---------------+---------+-----------+----------+--------------+ PERO     Full                                                        +---------+---------------+---------+-----------+----------+--------------+   +---------+---------------+---------+-----------+----------+--------------+ LEFT     CompressibilityPhasicitySpontaneityPropertiesThrombus Aging +---------+---------------+---------+-----------+----------+--------------+ CFV      Full           Yes      Yes                                 +---------+---------------+---------+-----------+----------+--------------+ SFJ      Full                                                         +---------+---------------+---------+-----------+----------+--------------+ FV Prox  Full                                                        +---------+---------------+---------+-----------+----------+--------------+ FV Mid   Full                                                        +---------+---------------+---------+-----------+----------+--------------+ FV DistalFull                                                        +---------+---------------+---------+-----------+----------+--------------+  PFV      Full                                                        +---------+---------------+---------+-----------+----------+--------------+ POP      Full           Yes      Yes                                 +---------+---------------+---------+-----------+----------+--------------+ PTV      None                                         Acute          +---------+---------------+---------+-----------+----------+--------------+ PERO     Full                                                        +---------+---------------+---------+-----------+----------+--------------+ Gastroc  None                                         Acute          +---------+---------------+---------+-----------+----------+--------------+     Summary: Right: There is no evidence of deep vein thrombosis in the lower extremity. However, portions of this examination were limited- see technologist comments above. Left: Findings consistent with acute deep vein thrombosis involving the left posterior tibial veins, and left gastrocnemius veins. No cystic structure found in the popliteal fossa. Ultrasound characteristics of enlarged lymph nodes noted in the groin.  *See table(s) above for measurements and observations.    Preliminary     35 minutes with more than 50% spent in reviewing records, counseling patient and coordinating care.  Clemons Salvucci T. [place]  If 7PM-7AM,  please contact night-coverage [email or web address] Password [email or web address] 03/07/2019, 2:15 PM

## 2019-03-07 NOTE — Telephone Encounter (Signed)
LMTCB

## 2019-03-07 NOTE — Progress Notes (Signed)
Pulmonary Progress Note S: No events, cough and rash still bothering her.  No chest pain, palpitations, or hemoptysis.  O: Vitals:   03/07/19 1145 03/07/19 1213  BP:  129/81  Pulse:  91  Resp:  (!) 21  Temp: 97.9 F (36.6 C)   SpO2:  95%   GEN: thin elderly woman in NAD HEENT: MMM, malampatti 3 HEART: RRR, ext warm LUNGS: Improved air movement on R, no accessory muscle use ABD: Soft, +BS EXT: lower ext edema BL, LUE edema SKIN: morbilliform rash noted  A: - Diffuse adenopathy concerning for lymphoma - Rash suspected related to above - Unprovoked submassive PE without indication for advanced therapy - R pleural effusion initial cyto neg, + for inflammation, flow pending (confirmed with path dept) - Persistent cough unfortunately not relieved with thoracentesis  P: - Switch from heparin to lovenox in anticipation of discharge - Reaching out to Dr. Blenda Nicely to see when she can do excisional biopsy so we can coordinate cessation of AC (assuming flow is negative) - Check ANA, IgE, RF to complete rheum w/u (expect neg) - Trial of breo for cough - Walking pulse ox prior to going home - Hopefully home tomorrow, will arrange f/u in clinic next week to discuss pleural fluid flow results, cough symptom management and timing of AC cessation for LN excisional biopsy  Erskine Emery MD PCCM

## 2019-03-07 NOTE — Progress Notes (Addendum)
SATURATION QUALIFICATIONS: (This note is used to comply with regulatory documentation for home oxygen)  Patient Saturations on Room Air at Rest = 94%  Patient Saturations on Room Air while Ambulating = 78%  Patient Saturations on 2 Liters of oxygen while Ambulating = 92%  Please briefly explain why patient needs home oxygen: Patient desats while ambulating on room air.

## 2019-03-07 NOTE — Progress Notes (Signed)
Pt arrived alert and oriented. Pt was brought to the floor and ambulated to the rest room with a walker. Pt's skin was checked by the required 2 RN's. Pt was found to have a rash about her entire body. Pt also has a small wound on the left, lower, inner leg to which a foam was placed. Pt was educated on the phone system and the call bell. Pt stated that she understood that she need to call before attempting to get out of the bed. The phone and call bell was placed within the pt's reach. Pt's daughter, [name], was contacted and updated.

## 2019-03-07 NOTE — Progress Notes (Signed)
ANTICOAGULATION CONSULT NOTE - Follow Up Consult  Pharmacy Consult for heparin Indication: pulmonary embolus  Allergies  Allergen Reactions  . Codeine Other (See Comments)    Reaction not recalled  . Thiazide-Type Diuretics Other (See Comments)    Blurry vision?? (patient stated she has macular degeneration)    Patient Measurements: Weight: 125 lb (56.7 kg) Heparin Dosing Weight: 56.7  Vital Signs: Temp: 97.9 F (36.6 C) (10/16 1145) Temp Source: Oral (10/16 1145) BP: 130/77 (10/16 0813) Pulse Rate: 86 (10/16 0813)  Labs: Recent Labs    03/06/19 0158 03/06/19 0524 03/06/19 1133 03/06/19 1436 03/06/19 1903 03/07/19 0906  HGB 14.0  --   --   --   --  13.1  HCT 45.3  --   --   --   --  39.5  PLT 72*  --   --   --   --  214  HEPARINUNFRC  --   --   --   --  0.10* 0.21*  CREATININE 0.79  --   --   --   --  0.76  TROPONINIHS 67* 352* 321* 207*  --   --     Estimated Creatinine Clearance: 46.2 mL/min (by C-G formula based on SCr of 0.76 mg/dL).  Assessment: Pt is an 72 YOF presenting with SOB. CT postitive for acute PE. Not on anticoagulation PTA. CBC wnl.  HL 10/16: 0.21, subtherapeutic No issues with IV heparin and no bleeding per RN  Goal of Therapy:  Heparin level 0.3-0.7 units/ml Monitor platelets by anticoagulation protocol: Yes   Plan:  Give 850 unit bolus, then increase rate by 2 u/kg/hr to 1250 units/hr Check heparin level in 6 hours Daily heparin level, CBC monitor s/sx bleeding  Gaylan Gerold 03/07/2019,11:55 AM

## 2019-03-07 NOTE — Telephone Encounter (Signed)
-----   Message from Randa Spike, [place] sent at 03/06/2019  4:24 PM EDT ----- Regarding: FW: Follow up with Dr. Carlis Abbott  ----- Message ----- From: Candee Furbish, MD Sent: 03/06/2019   4:13 PM EDT To: Julian Hy, DO, [facility] Triage Pool Subject: Follow up with Dr. Carlis Abbott                       Wed/Thurs next week with Dr. Carlis Abbott to review pleural fluid studies and arrange OP excisional bx of LN in setting of new submassive PE.  Thanks! Erskine Emery

## 2019-03-07 NOTE — Progress Notes (Signed)
Patient's son, Nicki Reaper, was able to correctly demonstrate giving lovenox injection.

## 2019-03-08 LAB — MAGNESIUM: Magnesium: 2.1 mg/dL (ref 1.7–2.4)

## 2019-03-08 LAB — BASIC METABOLIC PANEL
Anion gap: 9 (ref 5–15)
BUN: 13 mg/dL (ref 8–23)
CO2: 21 mmol/L — ABNORMAL LOW (ref 22–32)
Calcium: 8.6 mg/dL — ABNORMAL LOW (ref 8.9–10.3)
Chloride: 109 mmol/L (ref 98–111)
Creatinine, Ser: 0.68 mg/dL (ref 0.44–1.00)
GFR calc Af Amer: >60 mL/min (ref >60)
GFR calc non Af Amer: >60 mL/min (ref >60)
Glucose, Bld: 134 mg/dL — ABNORMAL HIGH (ref 70–99)
Potassium: 4.1 mmol/L (ref 3.5–5.1)
Sodium: 139 mmol/L (ref 135–145)

## 2019-03-08 LAB — ANA W/REFLEX IF POSITIVE: Anti Nuclear Antibody (ANA): NEGATIVE

## 2019-03-08 LAB — RHEUMATOID FACTOR: Rheumatoid fact SerPl-aCnc: <10 IU/mL (ref 0.0–13.9)

## 2019-03-08 LAB — LACTATE DEHYDROGENASE: LDH: 211 U/L — ABNORMAL HIGH (ref 98–192)

## 2019-03-08 MED ORDER — TRIAMCINOLONE ACETONIDE 0.1 % EX CREA
TOPICAL_CREAM | Freq: Three times a day (TID) | CUTANEOUS | Status: DC | PRN
  Administered 2019-03-08: 08:00:00 via TOPICAL
  Filled 2019-03-08 (×2): qty 15

## 2019-03-08 MED ORDER — BENZONATATE 200 MG PO CAPS: 200.0000 mg | ORAL_CAPSULE | Freq: Three times a day (TID) | ORAL | 0 refills | Status: DC

## 2019-03-08 MED ORDER — PANTOPRAZOLE SODIUM 40 MG PO TBEC: 40.0000 mg | DELAYED_RELEASE_TABLET | Freq: Every day | ORAL | 1 refills | Status: DC

## 2019-03-08 MED ORDER — FLUTICASONE PROPIONATE 50 MCG/ACT NA SUSP: 1.0000 | Freq: Every day | NASAL | 1 refills | Status: DC

## 2019-03-08 MED ORDER — TRIAMCINOLONE 0.1 % CREAM:EUCERIN CREAM 1:1: TOPICAL_CREAM | Freq: Three times a day (TID) | CUTANEOUS | Status: DC | PRN

## 2019-03-08 MED ORDER — HYDROXYZINE HCL 25 MG PO TABS: 25.0000 mg | ORAL_TABLET | Freq: Every evening | ORAL | 0 refills | Status: DC | PRN

## 2019-03-08 MED ORDER — BENZONATATE 100 MG PO CAPS
200.0000 mg | ORAL_CAPSULE | Freq: Three times a day (TID) | ORAL | Status: DC
  Administered 2019-03-08: 200 mg via ORAL
  Filled 2019-03-08: qty 2

## 2019-03-08 NOTE — Discharge Summary (Signed)
Physician Discharge Summary  Sarah [name] YIF:[id number] DOB: 03/04/1933 DOA: 03/06/2019  PCP: Lajean Manes, MD  Admit date: 03/06/2019 Discharge date: 03/08/2019  Admitted From: Home Disposition: Home  Recommendations for Outpatient Follow-up:  1. Follow up with PCP, pulmonology and ENT in 1-2 weeks 2. Please obtain CBC/BMP/Mag at follow up 3. Please follow up on the following pending results: Autoimmune labs, pleural fluid flow cytometry  Home Health: None Equipment/Devices: None  Discharge Condition: Stable CODE STATUS: DNR/DNI   Hospital Course: 85 year old female with history of hypothyroidism, HTN, HLD and remote breast cancer (2013) in remission presenting with acute shortness of breath, leg swelling, left hand swelling and some weight gain despite poor appetite.   In ED, CTA chest revealed at least submassive PE, moderate right pleural effusion, diffuse LAD concerning for lymphoma.  Started on heparin drip.  PCCM consulted for guidance.  She had thoracocentesis with removal of 1200 cc cloudy exudative pleural fluid.    Of note, patient had CT neck, chest/abdomen/pelvis on 02/27/2019 that revealed extensive diffuse LAD, new subpleural pulmonary nodules and 68mm oropharyngeal mass.  There was concern about lymphoma and she was referred to ENT, Dr. Wellington Hampshire for lymph node biopsy which was scheduled for 1017/20.   Cardiology consulted for elevated which was thought to be demand ischemia from right heart strain in the setting of PE.  Patient was transitioned to subcu Lovenox by pulmonology the next day and remained stable.  On the day of discharge, ambulating on room air maintain saturation greater than 89%.  Discharged on subcu Lovenox per PCCM recommendation.  Also started on Breo Ellipta.  Pulmonology following the patient follow-up in 1 week and coordinate lymph node biopsy with ENT.  See individual problem list below for more hospital course.  Discharge  Diagnoses:  Submassive PE with right heart strain/left lower extremity DVT: Noted on CTA chest.  Echocardiogram with slightly reduced RVSF.  Hemodynamically stable.  Ambulated and maintained saturation greater than 89%. -Discharged on subcu Lovenox per pulmonology recommendation. -Follow autoimmune labs pending. -Patient will follow up with pulmonology in 1 week  Diffuse LAD/diffuse maculopapular rash concerning for lymphoma -Pulmonology recommendations anticoagulation at least for 2 weeks before biopsy. -Pulmonology to arrange outpatient follow-up in 1 week and coordinate lymph node biopsy with ENT  Pleural effusion: Imaging revealed moderate right-sided pleural effusion and mild left-sided pleural effusion -Ultrasound-guided thoracocentesis of right pleural effusion yielded 1200 cc exudative fluid -Pleural fluid cytology and culture negative. Flow cytometry pending.  Pulmonology to follow-up -Follow systemic inflammatory labs  Subacute cough: she denies reflux or postnasal drainage but admits to some seasonal allergies.  CT neck also revealed 46mm oropharyngeal mass which could be contributing.  -Started on Breo Ellipta, Flonase nasal spray, Tessalon Perles and PPI -Follow-up with ENT  Elevated troponin: Likely demand ischemia from right heart strain due to PE.  No anginal symptoms or significant EKG changes. -Cardiology consulted-signed off  Essential hypertension: Normotensive -Continue Norvasc  Hypothyroidism: TSH normal. -Continue home Synthroid  Hypokalemia: -Replenish and recheck. -Check magnesium  Skin pruritus: Likely due to possible lymphoma. -Gave Rx for hydroxyzine. -Continue home triamcinolone ointment  History of breast cancer in 2013: Presumably in remission  Thrombocytopenia-likely erroneous lab.  Resolved.  Discharge Instructions  Discharge Instructions    Diet - low sodium heart healthy   Complete by: As directed    Discharge instructions    Complete by: As directed    It has been a pleasure taking care of you! You were hospitalized with  blood clot in your lung.  You were started on blood thinner for this and your symptoms improved.  We are discharging you on more of this medication to continue taking at home.  You also had some fluid collection around your lung which was drained.  The pulmonologist office will arrange outpatient follow-up next week to discuss about some of the lab results, coordinate for lymph node biopsy with your ENT, and plan going forward In regards to your cough, we gave you a prescription for Tessalon and acid reflux medication.  You may also try tablespoonful of honey before bedtime, Flonase and saline nose spray as we discussed. Please review your new medication list and the directions before you take your medications. Please call your primary care office as soon as possible to schedule hospital follow-up visit in 1 week.   Take care,   Increase activity slowly   Complete by: As directed      Allergies as of 03/08/2019      Reactions   Codeine Other (See Comments)   Reaction not recalled   Thiazide-type Diuretics Other (See Comments)   Blurry vision?? (patient stated she has macular degeneration)      Medication List    TAKE these medications   amLODipine 2.5 MG tablet Commonly known as: NORVASC Take 2.5-5 mg by mouth See admin instructions. 5 mg in the morning, 2.5 mg in the evening   benzonatate 200 MG capsule Commonly known as: TESSALON Take 1 capsule (200 mg total) by mouth 3 (three) times daily.   CALCIUM 600 PO Take 600 mg by mouth daily.   cetirizine 10 MG tablet Commonly known as: ZYRTEC Take 10 mg by mouth daily.   enoxaparin 60 MG/0.6ML injection Commonly known as: LOVENOX Inject 0.6 mLs (60 mg total) into the skin every 12 (twelve) hours for 14 days.   fluticasone 50 MCG/ACT nasal spray Commonly known as: FLONASE Place 1 spray into both nostrils daily. Start taking on:  March 09, 2019   fluticasone furoate-vilanterol 100-25 MCG/INH Aepb Commonly known as: BREO ELLIPTA Inhale 1 puff into the lungs daily.   hydrOXYzine 25 MG tablet Commonly known as: ATARAX/VISTARIL Take 1 tablet (25 mg total) by mouth at bedtime as needed for itching.   levothyroxine 25 MCG tablet Commonly known as: SYNTHROID Take 25 mcg by mouth daily before breakfast.   olopatadine 0.1 % ophthalmic solution Commonly known as: PATANOL Place 1 drop into both eyes 2 (two) times daily.   Omega-3 500 MG Caps Take 500 mg by mouth daily.   pantoprazole 40 MG tablet Commonly known as: Protonix Take 1 tablet (40 mg total) by mouth daily.   PRESERVISION AREDS PO Take 1 capsule by mouth 2 (two) times daily.   triamcinolone cream 0.1 % Commonly known as: KENALOG Apply 1 application topically 2 (two) times daily.   VITAMIN B-12 PO Take 3,000 mcg by mouth daily.   Vitamin D 50 MCG (2000 UT) tablet Take 2,000 Units by mouth 2 (two) times daily.   vitamin E 400 UNIT capsule Take 400 Units by mouth daily.   zolpidem 10 MG tablet Commonly known as: AMBIEN Take 2.5 mg by mouth at bedtime.      Follow-up Information    Stoneking, Hal, MD. Schedule an appointment as soon as possible for a visit in 2 week(s).   Specialty: Internal Medicine Contact information: [address] [place] [address] [phone]           Consultations:  Pulmonology  Cardiology  Procedures/Studies: 2D Echo:  1. Left ventricular ejection fraction, by visual estimation, is 60 to 65%. The left ventricle has normal function. Normal left ventricular size. There is no left ventricular hypertrophy.  2. Left ventricular diastolic Doppler parameters are consistent with impaired relaxation pattern of LV diastolic filling.  3. Global right ventricle has mildly reduced systolic function.The right ventricular size is moderately enlarged. No increase in right ventricular wall  thickness.  4. Left atrial size was normal.  5. Right atrial size was mildly dilated.  6. Trivial pericardial effusion is present.  7. The mitral valve is normal in structure. Trace mitral valve regurgitation. No evidence of mitral stenosis.  8. The tricuspid valve is normal in structure. Tricuspid valve regurgitation severe.  9. The aortic valve is normal in structure. Aortic valve regurgitation was not visualized by color flow Doppler. Structurally normal aortic valve, with no evidence of sclerosis or stenosis. 10. The pulmonic valve was normal in structure. Pulmonic valve regurgitation is mild by color flow Doppler. 11. Mildly elevated pulmonary artery systolic pressure. 12. The inferior vena cava is normal in size with greater than 50% respiratory variability, suggesting right atrial pressure of 3 mmHg.  Dg Mastoid 3 Views  Result Date: 02/17/2019 CLINICAL DATA:  Fever, not be hind left ear with mastoid pain. EXAM: MASTOID - 3 + VIEW COMPARISON:  None FINDINGS: No cortical erosion of the mastoid is noted. Mastoid air cells are clear bilaterally on plain film radiography. No visible soft tissue abnormality is demonstrated on radiographs. There is asymmetry with respect to density perhaps indicating sclerosis near the middle ear on the left. IMPRESSION: Question asymmetric mastoid sclerosis, potentially projectional but given symptom CT may be helpful for further assessment. Electronically Signed   By: Zetta Bills M.D.   On: 02/17/2019 12:01   Dg Chest 2 View  Result Date: 02/17/2019 CLINICAL DATA:  Dry cough, shortness of breath. EXAM: CHEST - 2 VIEW COMPARISON:  August 10, 2011 FINDINGS: There is interruption of the normal tracheal air column at the thoracic inlet. No buckling of the trachea is seen distally. Lungs are well expanded and there are postoperative changes projecting over the right inferior chest likely within the breast. Small bilateral pleural effusions are noted.  Cardiomediastinal contours are stable. Added density is noted projecting over right inferior chest, potentially a pulmonary nodule. Atherosclerotic changes are noted throughout the aorta. No acute bone finding. IMPRESSION: Bilateral effusions in this patient with history of breast cancer, of uncertain significance with question of right lower lobe pulmonary nodule, consider chest CT for further assessment. Unusual appearance of tracheal air column at the thoracic inlet. Correlate with any history of swallowing difficulty or changes in voice with dedicated imaging with chest CT and or CT of the neck as indicated. Atherosclerotic changes in the thoracic aorta. These results will be called to the ordering clinician or representative by the Radiologist Assistant, and communication documented in the PACS or zVision Dashboard. Electronically Signed   By: Zetta Bills M.D.   On: 02/17/2019 12:07   Ct Soft Tissue Neck W Contrast  Result Date: 02/27/2019 CLINICAL DATA:  Abnormal appearance of the trachea on chest radiographs. Abdominal bloating and cough for 6 weeks. History of breast cancer. EXAM: CT NECK WITH CONTRAST TECHNIQUE: Multidetector CT imaging of the neck was performed using the standard protocol following the bolus administration of intravenous contrast. CONTRAST:  19mL ISOVUE-300 IOPAMIDOL (ISOVUE-300) INJECTION 61% COMPARISON:  None. FINDINGS: Pharynx and larynx: There is an  11 x 8 x 16 mm exophytic mucosal mass in the superior oropharynx on the left (series 11, image 39). There is also an 8 x 4 mm enhancing mucosal/submucosal lesion more inferiorly in the oropharynx involving the anterior aspect of the left palatine tonsil (series 11, image 49). There is moderate distension of the oropharyngeal airway and hypopharynx. The trachea is widely patent without a focal abnormality identified to account for the appearance on chest radiographs which was likely projectional. Salivary glands: Multiple small  enhancing parotid masses measuring up to 10 mm in size bilaterally, likely enlarged intraparotid lymph nodes. Unremarkable submandibular glands. Thyroid: Diffuse thyroid heterogeneity with mild asymmetric enlargement of the left lobe. Lymph nodes: There are numerous mildly enlarged lymph nodes throughout all stations of the neck bilaterally with the largest measuring 10 mm in short axis in level II. Vascular: Major vascular structures of the neck are patent. Limited intracranial: Unremarkable. Visualized orbits: Bilateral cataract extraction. Mastoids and visualized paranasal sinuses: Paranasal sinuses and mastoid air cells are clear. Skeleton: No suspicious osseous lesion. Subcentimeter sclerotic focus in the T4 vertebral body is unchanged from a 09/06/2011 chest CT and likely benign. Bilateral facet ankylosis at C2-3 and C3-4. Severe facet arthrosis at C4-5 with grade 1 anterolisthesis. Advanced disc degeneration in the lower cervical spine. Upper chest: Reported separately. Other: None. IMPRESSION: 1. Bilateral cervical lymphadenopathy most concerning for lymphoma given findings on separate chest, abdomen, and pelvis CT today. 2. 16 mm left superior oropharyngeal mass and 8 mm mucosal/submucosal lesion more inferiorly involving the anterior left tonsil. Recommend ENT referral and direct visualization. Electronically Signed   By: Logan Bores M.D.   On: 02/27/2019 17:13   Ct Chest W Contrast  Result Date: 02/27/2019 CLINICAL DATA:  Abnormal chest radiographs, effusions, abnormal appearance of the trachea EXAM: CT CHEST, ABDOMEN, AND PELVIS WITH CONTRAST TECHNIQUE: Multidetector CT imaging of the chest, abdomen and pelvis was performed following the standard protocol during bolus administration of intravenous contrast. CONTRAST:  145mL ISOVUE-300 IOPAMIDOL (ISOVUE-300) INJECTION 61%, additional oral enteric contrast COMPARISON:  Chest radiograph, 02/17/2019, CT chest, 09/06/2011, MR abdomen, 02/23/2012  FINDINGS: CT CHEST FINDINGS Cardiovascular: Aortic atherosclerosis. Cardiomegaly. No pericardial effusion. Mediastinum/Nodes: Numerous bilateral axillary and subpectoral lymph nodes, largest left axillary nodes measuring 1.8 x 1.0 cm (series 2, image 10, 16). Numerous enlarged pretracheal lymph nodes, largest node measuring 1.4 x 1.1 cm (series 2, image 17) enlarged, heterogeneous thyroid. Trachea, and esophagus demonstrate no significant findings. Lungs/Pleura: Moderate right, small left pleural effusions with associated atelectasis or consolidation. Subpleural radiation fibrosis of the anterior right lung. There is a new subpleural pulmonary nodule of the anterior right middle lobe measuring 6 mm (series 4, image 105). Musculoskeletal: Postoperative findings of the right breast. CT ABDOMEN PELVIS FINDINGS Hepatobiliary: No solid liver abnormality is seen. No gallstones, gallbladder wall thickening, or biliary dilatation. Pancreas: There is a fluid attenuation lesion of the pancreatic uncinate measuring 2.4 x 1.3 cm (series 2, image 63). No pancreatic ductal dilatation or surrounding inflammatory changes. Spleen: Splenomegaly, maximum span 15 cm. Adrenals/Urinary Tract: Adrenal glands are unremarkable. Large exophytic cyst of the midportion of the left kidney. Bladder is unremarkable. Stomach/Bowel: Stomach is within normal limits. Appendix not clearly visualized. No evidence of bowel wall thickening, distention, or inflammatory changes. Severe sigmoid diverticulosis. Vascular/Lymphatic: Aortic atherosclerosis. Numerous enlarged mesenteric, periportal, retroperitoneal, iliac, pelvic sidewall, and inguinal lymph nodes, largest nodes in the left retroperitoneum fairly bulky measuring 2.2 x 1.4 cm (series 2, image 67). Reproductive: Status post hysterectomy. Other:  No abdominal wall hernia or abnormality. Small volume ascites. Musculoskeletal: There is generally osteopenic appearance of the skeleton. There are  numerous subtle hypodense lesions, particularly of the pelvis (e.g. Series 2, image 88) and select vertebral bodies, for example L4 (series 8, image 92). IMPRESSION: 1. Extensive lymphadenopathy throughout the chest, abdomen, and pelvis, with index lymph nodes identified above. Splenomegaly. Findings are most consistent with lymphoma with recurrent, metastatic breast malignancy less favored. 2. There is generally osteopenic appearance of the skeleton. There are numerous subtle hypodense lesions, particularly of the pelvis (e.g. Series 2, image 88) and select vertebral bodies, for example L4 (series 8, image 92). This is suspicious although not definitive for osseous metastatic disease. Characterization for metabolic activity by nuclear scintigraphic bone scan or PET-CT would be helpful to further evaluate. 3. Moderate right, small left pleural effusions with associated atelectasis or consolidation. Subpleural radiation fibrosis of the anterior right lung. 4. There is a new subpleural pulmonary nodule of the anterior right middle lobe measuring 6 mm (series 4, image 105), nonspecific. There is no obvious pleural thickening or nodularity definitive for metastatic disease. 5.  Small volume ascites. 6. There is a fluid attenuation lesion of the pancreatic uncinate measuring 2.4 x 1.3 cm (series 2, image 63), not substantially changed when compared to remote prior MR examination dated 02/23/2012 and likely sequelae of a prior pseudocyst versus incidental pancreatic IPMN. 7.  Cardiomegaly. 8. Other chronic and incidental findings as detailed above. Aortic Atherosclerosis (ICD10-I70.0). Electronically Signed   By: Eddie Candle M.D.   On: 02/27/2019 16:52   Ct Angio Chest Pe W And/or Wo Contrast  Addendum Date: 03/06/2019   ADDENDUM REPORT: 03/06/2019 09:17 ADDENDUM: Comment: No dominant thyroid lesions. Electronically Signed   By: Lowella Grip III M.D.   On: 03/06/2019 09:17   Result Date: 03/06/2019 CLINICAL  DATA:  Shortness of breath.  History of breast carcinoma EXAM: CT ANGIOGRAPHY CHEST WITH CONTRAST TECHNIQUE: Multidetector CT imaging of the chest was performed using the standard protocol during bolus administration of intravenous contrast. Multiplanar CT image reconstructions and MIPs were obtained to evaluate the vascular anatomy. CONTRAST:  167mL OMNIPAQUE IOHEXOL 350 MG/ML SOLN COMPARISON:  February 27, 2019 CT chest, abdomen, and pelvis; chest CT September 06, 2011 FINDINGS: Cardiovascular: There are pulmonary emboli arising from the distal most aspect of the left main pulmonary artery with extension primarily into left lower lobe pulmonary arterial branches. There is incompletely obstructing pulmonary embolus as well in the left upper lobe pulmonary artery proximally. There is more peripheral pulmonary embolus in the right lower lobe region. The right ventricle to left ventricle diameter ratio is 1.6, indicative of right heart strain. There is no appreciable thoracic aortic aneurysm or dissection. The visualized great vessels show patchy areas of atherosclerotic calcification. There is extensive aortic atherosclerosis as well as scattered foci of coronary artery calcification. There is no pericardial effusion or pericardial thickening. Mediastinum/Nodes: There are multiple lymph nodes throughout the axillary and subpectoral regions. Most of these lymph nodes are subcentimeter. Several lymph nodes are borderline prominent, unchanged from 1 week prior. There are several prominent lymph nodes surrounding the mid to distal trachea, largest measuring 1.2 x 1.1 cm. There is no new adenopathy compared to 1 week prior. No esophageal lesions are evident. Lungs/Pleura: There is a large pleural effusion on the right with a much smaller pleural effusion on the left. There is consolidation, largely due to compressive atelectasis throughout the right lower lobe. There is milder compressive atelectasis in  the left base. There is  a nodular opacity abutting the pleura in the anterior segment of the right upper lobe measuring 1.1 x 0.6 cm. A nodular opacity abutting the pleura in the right middle lobe measures 7 mm, better seen on recent prior study. Upper Abdomen: In the visualized upper abdomen, there is aortic atherosclerosis. Spleen is incompletely visualized but appears enlarged. Spleen is noted to be enlarged on recent CT abdomen study. There appears to be adenopathy in the upper retroperitoneum, incompletely visualized. Musculoskeletal: There is degenerative change in the thoracic spine. No blastic or lytic bone lesions are evident. A bone island is noted in the T4 vertebral body, stable. Postoperative change in the right breast region noted. Review of the MIP images confirms the above findings. IMPRESSION: 1. Pulmonary embolus arising from the distal left main pulmonary artery with extension into multiple left lower lobe pulmonary arterial branches. There is also incompletely obstructing pulmonary embolus in the proximal left upper lobe pulmonary artery. More distal pulmonary embolus in the right lower lobe pulmonary artery. Positive for acute PE with CT evidence of right heart strain (RV/LV Ratio = 1.6) consistent with at least submassive (intermediate risk) PE. The presence of right heart strain has been associated with an increased risk of morbidity and mortality. Please activate Code PE by paging [phone]. 2. Sizable pleural effusion on the right with smaller pleural effusion on the left. Consolidation and compressive atelectasis throughout most of the right lower lobe. Milder atelectasis left base. Nodular opacities on the right, likely metastatic foci. 3.  Stable adenopathy compared to 1 week prior. 4. Enlargement of spleen, incompletely visualized but documented CT 1 week prior. Concern for potential lymphoma. 5. Aortic atherosclerosis. No aneurysm or dissection evident. Foci of coronary artery calcification noted. Aortic  Atherosclerosis (ICD10-I70.0). Critical Value/emergent results were called by telephone at the time of interpretation on 03/06/2019 at 9:12 am to provider Dr. Ronnald Nian, ED physician, who verbally acknowledged these results. Electronically Signed: By: Lowella Grip III M.D. On: 03/06/2019 09:13   Ct Abdomen Pelvis W Contrast  Result Date: 02/27/2019 CLINICAL DATA:  Abnormal chest radiographs, effusions, abnormal appearance of the trachea EXAM: CT CHEST, ABDOMEN, AND PELVIS WITH CONTRAST TECHNIQUE: Multidetector CT imaging of the chest, abdomen and pelvis was performed following the standard protocol during bolus administration of intravenous contrast. CONTRAST:  128mL ISOVUE-300 IOPAMIDOL (ISOVUE-300) INJECTION 61%, additional oral enteric contrast COMPARISON:  Chest radiograph, 02/17/2019, CT chest, 09/06/2011, MR abdomen, 02/23/2012 FINDINGS: CT CHEST FINDINGS Cardiovascular: Aortic atherosclerosis. Cardiomegaly. No pericardial effusion. Mediastinum/Nodes: Numerous bilateral axillary and subpectoral lymph nodes, largest left axillary nodes measuring 1.8 x 1.0 cm (series 2, image 10, 16). Numerous enlarged pretracheal lymph nodes, largest node measuring 1.4 x 1.1 cm (series 2, image 17) enlarged, heterogeneous thyroid. Trachea, and esophagus demonstrate no significant findings. Lungs/Pleura: Moderate right, small left pleural effusions with associated atelectasis or consolidation. Subpleural radiation fibrosis of the anterior right lung. There is a new subpleural pulmonary nodule of the anterior right middle lobe measuring 6 mm (series 4, image 105). Musculoskeletal: Postoperative findings of the right breast. CT ABDOMEN PELVIS FINDINGS Hepatobiliary: No solid liver abnormality is seen. No gallstones, gallbladder wall thickening, or biliary dilatation. Pancreas: There is a fluid attenuation lesion of the pancreatic uncinate measuring 2.4 x 1.3 cm (series 2, image 63). No pancreatic ductal dilatation or  surrounding inflammatory changes. Spleen: Splenomegaly, maximum span 15 cm. Adrenals/Urinary Tract: Adrenal glands are unremarkable. Large exophytic cyst of the midportion of the left kidney. Bladder is unremarkable.  Stomach/Bowel: Stomach is within normal limits. Appendix not clearly visualized. No evidence of bowel wall thickening, distention, or inflammatory changes. Severe sigmoid diverticulosis. Vascular/Lymphatic: Aortic atherosclerosis. Numerous enlarged mesenteric, periportal, retroperitoneal, iliac, pelvic sidewall, and inguinal lymph nodes, largest nodes in the left retroperitoneum fairly bulky measuring 2.2 x 1.4 cm (series 2, image 67). Reproductive: Status post hysterectomy. Other: No abdominal wall hernia or abnormality. Small volume ascites. Musculoskeletal: There is generally osteopenic appearance of the skeleton. There are numerous subtle hypodense lesions, particularly of the pelvis (e.g. Series 2, image 88) and select vertebral bodies, for example L4 (series 8, image 92). IMPRESSION: 1. Extensive lymphadenopathy throughout the chest, abdomen, and pelvis, with index lymph nodes identified above. Splenomegaly. Findings are most consistent with lymphoma with recurrent, metastatic breast malignancy less favored. 2. There is generally osteopenic appearance of the skeleton. There are numerous subtle hypodense lesions, particularly of the pelvis (e.g. Series 2, image 88) and select vertebral bodies, for example L4 (series 8, image 92). This is suspicious although not definitive for osseous metastatic disease. Characterization for metabolic activity by nuclear scintigraphic bone scan or PET-CT would be helpful to further evaluate. 3. Moderate right, small left pleural effusions with associated atelectasis or consolidation. Subpleural radiation fibrosis of the anterior right lung. 4. There is a new subpleural pulmonary nodule of the anterior right middle lobe measuring 6 mm (series 4, image 105),  nonspecific. There is no obvious pleural thickening or nodularity definitive for metastatic disease. 5.  Small volume ascites. 6. There is a fluid attenuation lesion of the pancreatic uncinate measuring 2.4 x 1.3 cm (series 2, image 63), not substantially changed when compared to remote prior MR examination dated 02/23/2012 and likely sequelae of a prior pseudocyst versus incidental pancreatic IPMN. 7.  Cardiomegaly. 8. Other chronic and incidental findings as detailed above. Aortic Atherosclerosis (ICD10-I70.0). Electronically Signed   By: Eddie Candle M.D.   On: 02/27/2019 16:52   Dg Chest Port 1 View  Result Date: 03/06/2019 CLINICAL DATA:  Shortness of breath. EXAM: PORTABLE CHEST 1 VIEW COMPARISON:  CT 01/04/2019.  Chest x-ray 03/06/2019. FINDINGS: Cardiomegaly. No pulmonary venous congestion. Mild right base infiltrate. Bilateral pleural effusions. Right pleural effusion has improved from prior exam. No pneumothorax. Surgical clips over the right chest. IMPRESSION: 1.  Cardiomegaly.  No pulmonary venous congestion. 2. Mild right base infiltrate and bilateral pleural effusions again noted. Right pleural effusion has improved from prior exam. Electronically Signed   By: Marcello Moores  Register   On: 03/06/2019 12:38   Dg Chest Portable 1 View  Result Date: 03/06/2019 CLINICAL DATA:  85 year old female with shortness of breath. Recently diagnosed with lymphoma. EXAM: PORTABLE CHEST 1 VIEW COMPARISON:  CT Chest, Abdomen, and Pelvis 02/27/2019. FINDINGS: Portable AP semi upright view at 0208 hours. Moderate size right pleural effusion and small left pleural effusion appear not significantly changed. Stable cardiac size and mediastinal contours. Visualized tracheal air column is within normal limits. No pulmonary edema in the upper lungs. No pneumothorax or other confluent pulmonary opacity. No acute osseous abnormality identified. IMPRESSION: 1. Moderate size right and small left pleural effusions appear not  significantly changed since 02/27/19. 2. No new cardiopulmonary abnormality. Electronically Signed   By: Genevie Ann M.D.   On: 03/06/2019 02:20   Vas [place] Lower Extremity Venous (dvt)  Result Date: 03/08/2019  Lower Venous Study Indications: Pulmonary embolism.  Risk Factors: Confirmed PE. Limitations: Poor ultrasound/tissue interface. Comparison Study: No prior studies. Performing Technologist: Oliver Hum RVT  Examination Guidelines: A complete  evaluation includes B-mode imaging, spectral Doppler, color Doppler, and power Doppler as needed of all accessible portions of each vessel. Bilateral testing is considered an integral part of a complete examination. Limited examinations for reoccurring indications may be performed as noted.  +---------+---------------+---------+-----------+----------+--------------+  RIGHT     Compressibility Phasicity Spontaneity Properties Thrombus Aging  +---------+---------------+---------+-----------+----------+--------------+  CFV       Full            Yes       Yes                                    +---------+---------------+---------+-----------+----------+--------------+  SFJ       Full                                                             +---------+---------------+---------+-----------+----------+--------------+  FV Prox   Full                                                             +---------+---------------+---------+-----------+----------+--------------+  FV Mid    Full                                                             +---------+---------------+---------+-----------+----------+--------------+  FV Distal Full                                                             +---------+---------------+---------+-----------+----------+--------------+  PFV       Full                                                             +---------+---------------+---------+-----------+----------+--------------+  POP       Full            Yes       Yes                                     +---------+---------------+---------+-----------+----------+--------------+  PTV       Full                                                             +---------+---------------+---------+-----------+----------+--------------+  PERO  Full                                                             +---------+---------------+---------+-----------+----------+--------------+   +---------+---------------+---------+-----------+----------+--------------+  LEFT      Compressibility Phasicity Spontaneity Properties Thrombus Aging  +---------+---------------+---------+-----------+----------+--------------+  CFV       Full            Yes       Yes                                    +---------+---------------+---------+-----------+----------+--------------+  SFJ       Full                                                             +---------+---------------+---------+-----------+----------+--------------+  FV Prox   Full                                                             +---------+---------------+---------+-----------+----------+--------------+  FV Mid    Full                                                             +---------+---------------+---------+-----------+----------+--------------+  FV Distal Full                                                             +---------+---------------+---------+-----------+----------+--------------+  PFV       Full                                                             +---------+---------------+---------+-----------+----------+--------------+  POP       Full            Yes       Yes                                    +---------+---------------+---------+-----------+----------+--------------+  PTV       None  Acute           +---------+---------------+---------+-----------+----------+--------------+  PERO      Full                                                              +---------+---------------+---------+-----------+----------+--------------+  Gastroc   None                                             Acute           +---------+---------------+---------+-----------+----------+--------------+     Summary: Right: There is no evidence of deep vein thrombosis in the lower extremity. However, portions of this examination were limited- see technologist comments above. Left: Findings consistent with acute deep vein thrombosis involving the left posterior tibial veins, and left gastrocnemius veins. No cystic structure found in the popliteal fossa. Ultrasound characteristics of enlarged lymph nodes noted in the groin.  *See table(s) above for measurements and observations. Electronically signed by Harold Barban MD on 03/08/2019 at 12:43:36 AM.    Final      Subjective: Reports having a rough night due to persistent cough and generalized itching which has been going on for over a months.  Feels better this morning.  Denies chest pain or dyspnea.  Discharge Exam: Vitals:   03/08/19 0851 03/08/19 1222  BP: 122/68 129/67  Pulse: 84   Resp: (!) 23 20  Temp:  98.2 F (36.8 C)  SpO2: 98% 97%    GENERAL: No acute distress.  Sitting on the edge of the bed eating breakfast. HEENT: MMM.  Vision and hearing grossly intact.  NECK: Supple.  No JVD.  LUNGS:  No IWOB. Good air movement bilaterally. HEART:  RRR. Heart sounds normal.  ABD: Bowel sounds present. Soft. Non tender.  MSK/EXT:  Moves all extremities. No apparent deformity. No edema bilaterally. SKIN: Generalized maculopapular rash. NEURO: Awake, alert and oriented appropriately.  No gross deficit.  PSYCH: Calm. Normal affect.   The results of significant diagnostics from this hospitalization (including imaging, microbiology, ancillary and laboratory) are listed below for reference.     Microbiology: Recent Results (from the past 240 hour(s))  SARS CORONAVIRUS 2 (TAT 6-24 HRS) Nasopharyngeal Nasopharyngeal Swab      Status: None   Collection Time: 03/06/19  5:42 AM   Specimen: Nasopharyngeal Swab  Result Value Ref Range Status   SARS Coronavirus 2 NEGATIVE NEGATIVE Final    Comment: (NOTE) SARS-CoV-2 target nucleic acids are NOT DETECTED. The SARS-CoV-2 RNA is generally detectable in upper and lower respiratory specimens during the acute phase of infection. Negative results do not preclude SARS-CoV-2 infection, do not rule out co-infections with other pathogens, and should not be used as the sole basis for treatment or other patient management decisions. Negative results must be combined with clinical observations, patient history, and epidemiological information. The expected result is Negative. Fact Sheet for Patients: SugarRoll.be Fact Sheet for Healthcare Providers: https://www.woods-mathews.com/ This test is not yet approved or cleared by the [place] FDA and  has been authorized for detection and/or diagnosis of SARS-CoV-2 by FDA under an Emergency Use Authorization (EUA). This EUA will remain  in effect (meaning this test can be used)  for the duration of the COVID-19 declaration under Section 56 4(b)(1) of the Act, 21 U.S.C. section 360bbb-3(b)(1), unless the authorization is terminated or revoked sooner. Performed at [facility], [place] [address], [place], [place] [address]   Body fluid culture (includes gram stain)     Status: None (Preliminary result)   Collection Time: 03/06/19 11:50 AM   Specimen: Pleural Fluid  Result Value Ref Range Status   Specimen Description FLUID PLEURAL  Final   Special Requests NONE  Final   Gram Stain   Final    ABUNDANT WBC PRESENT,BOTH PMN AND MONONUCLEAR NO ORGANISMS SEEN    Culture   Final    NO GROWTH 2 DAYS Performed at [facility], [address], [place], [place] [address]    Report Status PENDING  Incomplete     Labs: BNP (last 3 results) No results for input(s): BNP in the  last 8760 hours. Basic Metabolic Panel: Recent Labs  Lab 03/06/19 0158 03/07/19 0906 03/07/19 1630 03/08/19 1120  NA 139 140  --  139  K 3.5 3.1*  --  4.1  CL 105 106  --  109  CO2 19* 23  --  21*  GLUCOSE 113* 142*  --  134*  BUN 19 17  --  13  CREATININE 0.79 0.76  --  0.68  CALCIUM 9.3 8.6*  --  8.6*  MG  --   --  2.1 2.1   Liver Function Tests: Recent Labs  Lab 03/06/19 0158  AST 21  ALT 11  ALKPHOS 85  BILITOT 0.4  PROT 6.0*  ALBUMIN 3.4*   No results for input(s): LIPASE, AMYLASE in the last 168 hours. No results for input(s): AMMONIA in the last 168 hours. CBC: Recent Labs  Lab 03/06/19 0158 03/07/19 0906  WBC 8.3 8.4  NEUTROABS 6.2  --   HGB 14.0 13.1  HCT 45.3 39.5  MCV 92.3 88.2  PLT 72* 214   Cardiac Enzymes: No results for input(s): CKTOTAL, CKMB, CKMBINDEX, TROPONINI in the last 168 hours. BNP: Invalid input(s): POCBNP CBG: No results for input(s): GLUCAP in the last 168 hours. D-Dimer No results for input(s): DDIMER in the last 72 hours. Hgb A1c No results for input(s): HGBA1C in the last 72 hours. Lipid Profile No results for input(s): CHOL, HDL, LDLCALC, TRIG, CHOLHDL, LDLDIRECT in the last 72 hours. Thyroid function studies Recent Labs    03/06/19 1436  TSH 1.531   Anemia work up No results for input(s): VITAMINB12, FOLATE, FERRITIN, TIBC, IRON, RETICCTPCT in the last 72 hours. Urinalysis    Component Value Date/Time   COLORURINE YELLOW 04/09/2012 1202   APPEARANCEUR CLEAR 04/09/2012 1202   LABSPEC 1.011 04/09/2012 1202   PHURINE 7.0 04/09/2012 1202   GLUCOSEU NEGATIVE 04/09/2012 1202   HGBUR NEGATIVE 04/09/2012 1202   BILIRUBINUR NEGATIVE 04/09/2012 1202   KETONESUR NEGATIVE 04/09/2012 1202   PROTEINUR NEGATIVE 04/09/2012 1202   UROBILINOGEN 0.2 04/09/2012 1202   NITRITE NEGATIVE 04/09/2012 1202   LEUKOCYTESUR TRACE (A) 04/09/2012 1202   Sepsis Labs Invalid input(s): PROCALCITONIN,  WBC,  LACTICIDVEN   Time  coordinating discharge: 35 minutes  SIGNED:  Mercy Riding, MD  [facility] 03/08/2019, 4:31 PM  If 7PM-7AM, please contact night-coverage [email or web address] Password [email or web address]

## 2019-03-08 NOTE — Progress Notes (Signed)
I just received the cream from the pharmacy. Patient is resting quietly when I  took the cream to her room to apply. Will apply when  she wakes up and ask for it. No acute distress noted while sleeping. Will continue to monitor.

## 2019-03-08 NOTE — Progress Notes (Signed)
SATURATION QUALIFICATIONS: (This note is used to comply with regulatory documentation for home oxygen)  Patient Saturations on Room Air at Rest = 98%  Patient Saturations on Room Air while Ambulating = 89%   

## 2019-03-08 NOTE — Progress Notes (Signed)
Nsg Discharge Note  Patient to be discharged home after lunch. Lovenox and inhaler supply delivered to patient room. Daughter, [name] present for transport. Discharge teaching completed.   Admit Date:  03/06/2019 Discharge date: 03/08/2019   [name] to be D/C'd Home per MD order.  AVS completed.  Copy for chart, and copy for patient signed, and dated. Patient/caregiver able to verbalize understanding.  Discharge Medication: Allergies as of 03/08/2019      Reactions   Codeine Other (See Comments)   Reaction not recalled   Thiazide-type Diuretics Other (See Comments)   Blurry vision?? (patient stated she has macular degeneration)      Medication List    TAKE these medications   amLODipine 2.5 MG tablet Commonly known as: NORVASC Take 2.5-5 mg by mouth See admin instructions. 5 mg in the morning, 2.5 mg in the evening   benzonatate 200 MG capsule Commonly known as: TESSALON Take 1 capsule (200 mg total) by mouth 3 (three) times daily.   CALCIUM 600 PO Take 600 mg by mouth daily.   cetirizine 10 MG tablet Commonly known as: ZYRTEC Take 10 mg by mouth daily.   enoxaparin 60 MG/0.6ML injection Commonly known as: LOVENOX Inject 0.6 mLs (60 mg total) into the skin every 12 (twelve) hours for 14 days.   fluticasone 50 MCG/ACT nasal spray Commonly known as: FLONASE Place 1 spray into both nostrils daily. Start taking on: March 09, 2019   fluticasone furoate-vilanterol 100-25 MCG/INH Aepb Commonly known as: BREO ELLIPTA Inhale 1 puff into the lungs daily.   hydrOXYzine 25 MG tablet Commonly known as: ATARAX/VISTARIL Take 1 tablet (25 mg total) by mouth at bedtime as needed for itching.   levothyroxine 25 MCG tablet Commonly known as: SYNTHROID Take 25 mcg by mouth daily before breakfast.   olopatadine 0.1 % ophthalmic solution Commonly known as: PATANOL Place 1 drop into both eyes 2 (two) times daily.   Omega-3 500 MG Caps Take 500 mg by mouth daily.    pantoprazole 40 MG tablet Commonly known as: Protonix Take 1 tablet (40 mg total) by mouth daily.   PRESERVISION AREDS PO Take 1 capsule by mouth 2 (two) times daily.   triamcinolone cream 0.1 % Commonly known as: KENALOG Apply 1 application topically 2 (two) times daily.   VITAMIN B-12 PO Take 3,000 mcg by mouth daily.   Vitamin D 50 MCG (2000 UT) tablet Take 2,000 Units by mouth 2 (two) times daily.   vitamin E 400 UNIT capsule Take 400 Units by mouth daily.   zolpidem 10 MG tablet Commonly known as: AMBIEN Take 2.5 mg by mouth at bedtime.       Discharge Assessment: Vitals:   03/08/19 0756 03/08/19 0851  BP: 122/68 122/68  Pulse:  84  Resp:  (!) 23  Temp: 98.2 F (36.8 C)   SpO2:  98%   Skin clean, dry and intact without evidence of skin break down, no evidence of skin tears noted. IV catheter discontinued intact. Site without signs and symptoms of complications - no redness or edema noted at insertion site, patient denies c/o pain - only slight tenderness at site.  Dressing with slight pressure applied.  D/c Instructions-Education: Discharge instructions given to patient/family with verbalized understanding. D/c education completed with patient/family including follow up instructions, medication list, d/c activities limitations if indicated, with other d/c instructions as indicated by MD - patient able to verbalize understanding, all questions fully answered. Patient instructed to return to ED, call 911,  or call MD for any changes in condition.  Patient escorted via [place], and D/C home via private auto.  Erasmo Leventhal, RN 03/08/2019 11:52 AM

## 2019-03-08 NOTE — Progress Notes (Signed)
The patient is crying and complaining of generalized itching and requesting to get her home cream. Looked in her home medications and observed she takes Triamcinolone 0.1% cream at home. Paged [facility] NP and awaiting for a new order for her. Will continue to [facility]

## 2019-03-08 NOTE — Progress Notes (Signed)
New order received for the cream from  [facility] NP. Will apply after pharmacist verification.

## 2019-03-09 LAB — BODY FLUID CULTURE: Culture: NO GROWTH

## 2019-03-09 LAB — IGE: IgE (Immunoglobulin E), Serum: 406 IU/mL (ref 6–495)

## 2019-03-09 LAB — CYCLIC CITRUL PEPTIDE ANTIBODY, IGG/IGA: CCP Antibodies IgG/IgA: 7 units (ref 0–19)

## 2019-03-11 ENCOUNTER — Encounter (HOSPITAL_COMMUNITY): Admission: RE | Payer: Self-pay | Source: Home / Self Care

## 2019-03-11 ENCOUNTER — Ambulatory Visit (HOSPITAL_COMMUNITY): Admission: RE | Admit: 2019-03-11 | Payer: Medicare Other | Source: Home / Self Care | Admitting: Otolaryngology

## 2019-03-11 DIAGNOSIS — I2699 Other pulmonary embolism without acute cor pulmonale: Secondary | ICD-10-CM | POA: Diagnosis not present

## 2019-03-11 SURGERY — EXCISION, MASS, NECK
Anesthesia: General | Laterality: Left

## 2019-03-11 NOTE — Telephone Encounter (Signed)
Appt has been made. Nothing further needed at this time.

## 2019-03-12 ENCOUNTER — Telehealth: Payer: Self-pay | Admitting: *Deleted

## 2019-03-12 NOTE — Telephone Encounter (Signed)
Dr. Felipa Eth called Dr. Benay Spice regarding referral this patient. Reports she is having neck mass biopsy on 10/26. H/O breast cancer and recent PE/ R/O lymhoma. Asking for patient to be seen next week. Called Dr. Carlyle Lipa office and left VM for his assistant to please have this referral ordered through HIM as required for proper MD assignment.

## 2019-03-12 NOTE — Progress Notes (Signed)
Synopsis: Referred in October 2020 for pleural effusion by Lajean Manes, MD.  Subjective:   PATIENT ID: Sarah [name] GENDER: female DOB: 08-Nov-1932, MRN: [id number]  Chief Complaint  Patient presents with   Hospitalization Follow-up    blood clots    Sarah [name] is an 85 year old woman who was referred for follow-up of a pleural effusion and a submassive PE.  She is accompanied by her daughter Sarah [name].  She was evaluated in the emergency department last week after presenting with acute onset of shortness of breath and heart racing where she was found to have a large right-sided pleural effusion, small left pleural effusion, and submassive PE.  She was started on Lovenox in the hospital.  She underwent drainage of her right pleural effusion, without a significant change in her symptoms.  Since August she has had significant pruritus with a less noticeable rash, dry cough, abdominal distention, edema in her left arm and both legs, and reduced appetite.  She had undergone CT scanning with her primary care provider, who had noted adenopathy.  She is planning for an excisional biopsy with ENT next week.  Her cough has been bothersome, intermittent, sometimes mild, but episodically very severe and refractory.  She has not had sputum or hemoptysis.  During the same timeframe she has noticed pruritus that is severe enough to keep her awake at night and a rash.  She has tried cetirizine, which did not help her symptoms.  Before the PE, she did not have shortness of breath associated with her cough.  Since her hospitalization she has had reduced exercise tolerance, but before August was very active with no activity limitations.  She has not required oxygen since discharge from the hospital.  She has no personal history of VTE or miscarriages.  Her mother had hemophilia, but there have been no clotting disorders in the family.  She has not had any recent surgeries, extensive travel, significant injuries,  or immobility to explain her DVT and PE.  She has a history of breast cancer in 2013, status post lumpectomy with radiation.  She never had chemotherapy.      Past Medical History:  Diagnosis Date   Allergy    codeine, thiazides   Anxiety    new dx   Arthritis    Breast cancer ([facility]) 03/14/12   bx=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,ER/PR=+,upper inner quad   Cancer ([facility]CC)    breast   Cataract    Glaucoma    laser treated years ago   HOH (hard of hearing)    Hyperlipemia    Hypertension    Hypothyroidism    Pancreatic cyst    benign   PONV (postoperative nausea and vomiting)    Pulmonary embolism ([place])    Radiation 06/11/2012-07/12/2012   17 sessions 4250 cGy, 3 sessions 750 cGy   Vertigo    Wears glasses    Wears partial dentures    partial upper     Family History  Problem Relation Age of Onset   Heart disease Father    Cancer Maternal Aunt        stomach     Past Surgical History:  Procedure Laterality Date   ABDOMINAL HYSTERECTOMY  1966   1/2 ovary left in    [place]   lumpectomy-lt   CATARACT EXTRACTION     b/l   COLONOSCOPY     EYE SURGERY     bilateral cataract removal   JOINT REPLACEMENT  2013  rt total knee   JOINT REPLACEMENT  1995   lt total knee   PARTIAL MASTECTOMY WITH NEEDLE LOCALIZATION  04/09/2012   Procedure: PARTIAL MASTECTOMY WITH NEEDLE LOCALIZATION;  Surgeon: Adin Hector, MD;  Location: [facility];  Service: General;  Laterality: Right;   TONSILLECTOMY     TOTAL KNEE ARTHROPLASTY  08/16/2011   Procedure: TOTAL KNEE ARTHROPLASTY;  Surgeon: Ninetta Lights, MD;  Location: [facility];  Service: Orthopedics;  Laterality: Right;    Social History   Socioeconomic History   Marital status: Widowed    Spouse name: Not on file   Number of children: 2   Years of education: Not on file   Highest education level: Not on file  Occupational History    Occupation: Retired    Comment: Interior and spatial designer strain: Not on file   Food insecurity    Worry: Not on file    Inability: Not on Lexicographer needs    Medical: Not on file    Non-medical: Not on file  Tobacco Use   Smoking status: Never Smoker   Smokeless tobacco: Never Used  Substance and Sexual Activity   Alcohol use: No   Drug use: No   Sexual activity: Never  Lifestyle   Physical activity    Days per week: Not on file    Minutes per session: Not on file   Stress: Not on file  Relationships   Social connections    Talks on phone: Not on file    Gets together: Not on file    Attends religious service: Not on file    Active member of club or organization: Not on file    Attends meetings of clubs or organizations: Not on file    Relationship status: Not on file   Intimate partner violence    Fear of current or ex partner: Not on file    Emotionally abused: Not on file    Physically abused: Not on file    Forced sexual activity: Not on file  Other Topics Concern   Not on file  Social History Narrative   Not on file     Allergies  Allergen Reactions   Codeine Other (See Comments)    Reaction not recalled   Latex    Thiazide-Type Diuretics Other (See Comments)    Blurry vision?? (patient stated she has macular degeneration)     Immunization History  Administered Date(s) Administered   Influenza, High Dose Seasonal PF 02/24/2017, 02/24/2019    Outpatient Medications Prior to Visit  Medication Sig Dispense Refill   amLODipine (NORVASC) 2.5 MG tablet Take 2.5-5 mg by mouth See admin instructions. 5 mg in the morning, 2.5 mg in the evening     benzonatate (TESSALON) 200 MG capsule Take 1 capsule (200 mg total) by mouth 3 (three) times daily. 20 capsule 0   Calcium Carbonate (CALCIUM 600 PO) Take 600 mg by mouth daily.     cetirizine (ZYRTEC) 10 MG tablet Take 10 mg by mouth daily.       Cholecalciferol (VITAMIN D) 50 MCG (2000 UT) tablet Take 2,000 Units by mouth 2 (two) times daily.     Cyanocobalamin (VITAMIN B-12 PO) Take 3,000 mcg by mouth daily.      enoxaparin (LOVENOX) 60 MG/0.6ML injection Inject 0.6 mLs (60 mg total) into the skin every 12 (twelve) hours for 14 days. 16.8 mL 0   fluticasone (FLONASE) 50  MCG/ACT nasal spray Place 1 spray into both nostrils daily. 16 g 1   fluticasone furoate-vilanterol (BREO ELLIPTA) 100-25 MCG/INH AEPB Inhale 1 puff into the lungs daily. 60 each 0   hydrOXYzine (ATARAX/VISTARIL) 25 MG tablet Take 1 tablet (25 mg total) by mouth at bedtime as needed for itching. 30 tablet 0   Krill Oil (OMEGA-3) 500 MG CAPS Take 500 mg by mouth daily.     levothyroxine (SYNTHROID) 25 MCG tablet Take 25 mcg by mouth daily before breakfast.      Multiple Vitamins-Minerals (PRESERVISION AREDS PO) Take 1 capsule by mouth 2 (two) times daily.      olopatadine (PATANOL) 0.1 % ophthalmic solution Place 1 drop into both eyes 2 (two) times daily.     pantoprazole (PROTONIX) 40 MG tablet Take 1 tablet (40 mg total) by mouth daily. 90 tablet 1   triamcinolone cream (KENALOG) 0.1 % Apply 1 application topically 2 (two) times daily.     vitamin E 400 UNIT capsule Take 400 Units by mouth daily.     zolpidem (AMBIEN) 10 MG tablet Take 2.5 mg by mouth at bedtime.      No facility-administered medications prior to visit.     Review of Systems  Constitutional: Positive for chills and malaise/fatigue. Negative for diaphoresis, fever and weight loss.  HENT: Positive for congestion. Negative for ear pain and sore throat.   Respiratory: Positive for cough and shortness of breath. Negative for hemoptysis, sputum production and wheezing.   Cardiovascular: Positive for leg swelling. Negative for chest pain and palpitations.  Gastrointestinal: Positive for nausea. Negative for abdominal pain and heartburn.       Reduced appetite  Genitourinary: Negative for  frequency.  Musculoskeletal: Negative for joint pain and myalgias.  Skin: Positive for itching and rash.  Neurological: Positive for weakness. Negative for dizziness and headaches.  Endo/Heme/Allergies: Bruises/bleeds easily.  Psychiatric/Behavioral: Negative for depression. The patient is not nervous/anxious.      Objective:   Vitals:   03/13/19 1119 03/13/19 1120  BP:  (!) 100/54  Pulse:  100  Temp: 98.2 F (36.8 C)   TempSrc: Temporal   SpO2:  97%  Weight: 124 lb 6.4 oz (56.4 kg)   Height: 5\' 4"  (1.626 m)    97% on  RA BMI Readings from Last 3 Encounters:  03/13/19 21.35 kg/m  03/07/19 21.12 kg/m  02/06/19 19.86 kg/m   Wt Readings from Last 3 Encounters:  03/13/19 124 lb 6.4 oz (56.4 kg)  03/07/19 125 lb (56.7 kg)  02/06/19 117 lb 8 oz (53.3 kg)    Physical Exam Vitals signs reviewed.  Constitutional:      Appearance: Normal appearance. She is not diaphoretic.  HENT:     Head: Normocephalic and atraumatic.     Nose:     Comments: Deferred due to masking requirement.    Mouth/Throat:     Comments: Deferred due to masking requirement. Eyes:     General: No scleral icterus. Neck:     Musculoskeletal: Neck supple.  Cardiovascular:     Rate and Rhythm: Normal rate and regular rhythm.     Heart sounds: No murmur.  Abdominal:     General: There is no distension.     Palpations: Abdomen is soft.     Tenderness: There is no abdominal tenderness.  Musculoskeletal:        General: No deformity.     Comments: Pitting edema of bilateral lower extremities  Lymphadenopathy:     Cervical:  Cervical adenopathy present.  Skin:    General: Skin is warm and dry.     Findings: No bruising.  Neurological:     General: No focal deficit present.     Mental Status: She is alert.     Coordination: Coordination normal.  Psychiatric:        Mood and Affect: Mood normal.        Behavior: Behavior normal.      CBC    Component Value Date/Time   WBC 8.4 03/07/2019  0906   RBC 4.48 03/07/2019 0906   HGB 13.1 03/07/2019 0906   HGB 15.2 07/25/2016 1053   HCT 39.5 03/07/2019 0906   HCT 44.9 07/25/2016 1053   PLT 214 03/07/2019 0906   PLT 176 07/25/2016 1053   MCV 88.2 03/07/2019 0906   MCV 88.5 07/25/2016 1053   MCH 29.2 03/07/2019 0906   MCHC 33.2 03/07/2019 0906   RDW 14.2 03/07/2019 0906   RDW 13.7 07/25/2016 1053   LYMPHSABS 0.7 03/06/2019 0158   LYMPHSABS 0.9 07/25/2016 1053   MONOABS 0.7 03/06/2019 0158   MONOABS 0.4 07/25/2016 1053   EOSABS 0.6 (H) 03/06/2019 0158   EOSABS 0.1 07/25/2016 1053   BASOSABS 0.0 03/06/2019 0158   BASOSABS 0.1 07/25/2016 1053    CHEMISTRY Recent Labs  Lab 03/07/19 0906 03/07/19 1630 03/08/19 1120  NA 140  --  139  K 3.1*  --  4.1  CL 106  --  109  CO2 23  --  21*  GLUCOSE 142*  --  134*  BUN 17  --  13  CREATININE 0.76  --  0.68  CALCIUM 8.6*  --  8.6*  MG  --  2.1 2.1     Pleural fluid Cell count with differential-825 WBCs, 51% PMNs, 44% lymphs, 4% monocytes, 1% eosinophils Protein 3.5 (Serum 6.0) pH 7.9 LDH 107 (Serum 211) Glucose 107 Culture- NG Flow cytometry not performed as fluid appeared more inflammatory than like lymphoma per Path  RF <10 Anti-CCP 7 ANA negative IgE 406  Chest Imaging- films reviewed: CTA chest 03/06/2019-PE in the left lower lobe and right PA extending into the lower and upper lobe segmental pulmonary arteries.  Moderate size right dependent pleural effusion with overlying compressive atelectasis and smaller left dependent pleural effusion.  Minimal mediastinal adenopathy, multiple subcentimeter axillary nodes bilaterally.  Subpleural anterior opacity on the right, mild groundglass opacities in the right middle lobe.  CXR 03/06/2019-minimal residual right pleural effusion, small left dependent pleural effusion.  Full re-expansion of the right lung post thoracentesis.  No obvious pulmonary masses or opacities.  Previous right breast surgical clips.  CXR, 2 view  03/13/2019-increasing right dependent pleural effusion, stable left pleural effusion  Pulmonary Functions Testing Results: No flowsheet data found.  Pathology:  Pleural fluid 03/06/2019-no malignant cells, but inflammatory cells present  Echocardiogram 03/06/2019: LVEF 60 to 08%, diastolic dysfunction.  Moderately enlarged RV with mildly reduced systolic function.  Mildly dilated RA, normal LA.  Trivial pericardial effusion.  Trace MR, severe TR.  Mildly elevated PASP.  LE ultrasound 03/07/2019- LLE DVT and posterior tibial veins, gastrocnemius veins.  Enlarged groin lymph nodes.     Assessment & Plan:     ICD-10-CM   1. Other acute pulmonary embolism with acute cor pulmonale (HCC)  I26.09 ECHOCARDIOGRAM COMPLETE  2. Pleural effusion  J90 DG Chest 2 View  3. Diffuse lymphadenopathy  R59.1     Submassive PE, DVT in LLE -likely provoked by currently undiagnosed malignancy. -  Continue Lovenox twice daily until the day prior to her biopsy. -Planning for excisional lymph node biopsy of the neck with ENT next week.  In preparation for biopsy, she will skip taking her evening dose of Lovenox the night before the procedure and the morning of.  She will resume anticoagulation the night of the procedure, starting Eliquis.  I will relay my recommendations to Dr. Blenda Nicely. -Eliquis 10 mg twice daily x1 week, starting after her procedure.  After the first week she will continue 5 mg twice daily.  If malignancy is confirmed, she will continue anticoagulation for the duration of her malignancy, at least 6 months. -Mrs. Clowers and her daughter understand that there is risk associated with anticoagulation interruption and undergoing general anesthesia with right heart strain associated with a pulmonary embolus.  It would not be wise to delay a biopsy a significant amount of time due to strong concern for cancer, so generally it would be advised to pursue biopsy as soon as it seems reasonably safe.  She  has been alerted that if she develops chest pain, shortness of breath, lightheadedness, or other concerning symptoms while her anticoagulation is disrupted, she needs to present to the emergency department or notify the physicians in the morning of the procedure as this could indicate decompensation requiring a change in treatment plan. -Follow-up echocardiogram at 1 month to evaluate for resolution of right heart strain.  Adenopathy in the groin, neck, axilla- concern for lymphoma -Excisional lymph node biopsy next week -Unfortunately unable to obtain flow cytometry on pleural fluid; I have discussed with pathology -If malignancy is confirmed, she would prefer to see her oncologist who has treated her in the past.  Exudative pleural effusion- concern for MPE.  Minimally increased over the past 6 days. -We will continue to monitor.  Repeat chest x-ray in 4 weeks. -If malignancy is confirmed, she may be an appropriate candidate for a Pleurx catheter. -Up-to-date on seasonal flu vaccine  RTC in 4 weeks with CXR.      Current Outpatient Medications:    amLODipine (NORVASC) 2.5 MG tablet, Take 2.5-5 mg by mouth See admin instructions. 5 mg in the morning, 2.5 mg in the evening, Disp: , Rfl:    benzonatate (TESSALON) 200 MG capsule, Take 1 capsule (200 mg total) by mouth 3 (three) times daily., Disp: 20 capsule, Rfl: 0   Calcium Carbonate (CALCIUM 600 PO), Take 600 mg by mouth daily., Disp: , Rfl:    cetirizine (ZYRTEC) 10 MG tablet, Take 10 mg by mouth daily. , Disp: , Rfl:    Cholecalciferol (VITAMIN D) 50 MCG (2000 UT) tablet, Take 2,000 Units by mouth 2 (two) times daily., Disp: , Rfl:    Cyanocobalamin (VITAMIN B-12 PO), Take 3,000 mcg by mouth daily. , Disp: , Rfl:    enoxaparin (LOVENOX) 60 MG/0.6ML injection, Inject 0.6 mLs (60 mg total) into the skin every 12 (twelve) hours for 14 days., Disp: 16.8 mL, Rfl: 0   fluticasone (FLONASE) 50 MCG/ACT nasal spray, Place 1 spray into  both nostrils daily., Disp: 16 g, Rfl: 1   fluticasone furoate-vilanterol (BREO ELLIPTA) 100-25 MCG/INH AEPB, Inhale 1 puff into the lungs daily., Disp: 60 each, Rfl: 0   hydrOXYzine (ATARAX/VISTARIL) 25 MG tablet, Take 1 tablet (25 mg total) by mouth at bedtime as needed for itching., Disp: 30 tablet, Rfl: 0   Krill Oil (OMEGA-3) 500 MG CAPS, Take 500 mg by mouth daily., Disp: , Rfl:    levothyroxine (SYNTHROID) 25 MCG tablet,  Take 25 mcg by mouth daily before breakfast. , Disp: , Rfl:    Multiple Vitamins-Minerals (PRESERVISION AREDS PO), Take 1 capsule by mouth 2 (two) times daily. , Disp: , Rfl:    olopatadine (PATANOL) 0.1 % ophthalmic solution, Place 1 drop into both eyes 2 (two) times daily., Disp: , Rfl:    pantoprazole (PROTONIX) 40 MG tablet, Take 1 tablet (40 mg total) by mouth daily., Disp: 90 tablet, Rfl: 1   triamcinolone cream (KENALOG) 0.1 %, Apply 1 application topically 2 (two) times daily., Disp: , Rfl:    vitamin E 400 UNIT capsule, Take 400 Units by mouth daily., Disp: , Rfl:    zolpidem (AMBIEN) 10 MG tablet, Take 2.5 mg by mouth at bedtime. , Disp: , Rfl:    apixaban (ELIQUIS) 5 MG TABS tablet, Take 2 tablets (10mg ) twice daily for 7 days, then 1 tablet (5mg ) twice daily, Disp: 60 tablet, Rfl: 0   [START ON 04/07/2019] apixaban (ELIQUIS) 5 MG TABS tablet, Take 1 tablet (5 mg total) by mouth 2 (two) times daily., Disp: 60 tablet, Rfl: 11   Julian Hy, DO [facility] 03/13/2019 1:56 PM

## 2019-03-13 ENCOUNTER — Other Ambulatory Visit: Payer: Self-pay

## 2019-03-13 ENCOUNTER — Ambulatory Visit (INDEPENDENT_AMBULATORY_CARE_PROVIDER_SITE_OTHER): Payer: Medicare Other

## 2019-03-13 ENCOUNTER — Ambulatory Visit (INDEPENDENT_AMBULATORY_CARE_PROVIDER_SITE_OTHER): Payer: Medicare Other | Admitting: Critical Care Medicine

## 2019-03-13 ENCOUNTER — Other Ambulatory Visit: Payer: Self-pay | Admitting: Oncology

## 2019-03-13 ENCOUNTER — Other Ambulatory Visit (HOSPITAL_COMMUNITY)
Admission: RE | Admit: 2019-03-13 | Discharge: 2019-03-13 | Disposition: A | Discharge status: Home / Self Care | Payer: Medicare Other | Source: Ambulatory Visit | Attending: Otolaryngology | Admitting: Otolaryngology

## 2019-03-13 ENCOUNTER — Encounter: Payer: Self-pay | Admitting: Critical Care Medicine

## 2019-03-13 ENCOUNTER — Telehealth: Payer: Self-pay | Admitting: Hematology

## 2019-03-13 VITALS — BP 100/54 | HR 100 | Temp 98.2°F | Ht 64.0 in | Wt 124.4 lb

## 2019-03-13 DIAGNOSIS — R591 Generalized enlarged lymph nodes: Secondary | ICD-10-CM

## 2019-03-13 DIAGNOSIS — Z01812 Encounter for preprocedural laboratory examination: Secondary | ICD-10-CM | POA: Insufficient documentation

## 2019-03-13 DIAGNOSIS — Z20828 Contact with and (suspected) exposure to other viral communicable diseases: Secondary | ICD-10-CM | POA: Insufficient documentation

## 2019-03-13 DIAGNOSIS — J90 Pleural effusion, not elsewhere classified: Secondary | ICD-10-CM

## 2019-03-13 DIAGNOSIS — I2609 Other pulmonary embolism with acute cor pulmonale: Secondary | ICD-10-CM

## 2019-03-13 MED ORDER — APIXABAN 5 MG PO TABS: ORAL_TABLET | ORAL | 0 refills | Status: DC

## 2019-03-13 MED ORDER — APIXABAN 5 MG PO TABS: 5.0000 mg | ORAL_TABLET | Freq: Two times a day (BID) | ORAL | 11 refills | Status: DC

## 2019-03-13 NOTE — Telephone Encounter (Signed)
I received an urgent new patient referral from Dr. Lajean Manes for probable lymphoma. Sarah [name] has been scheduled to see Dr. Irene Limbo on 10/30 at 12pm. Appt date and time has been given to the pt's daughter [name] who's aware to arrive 15 minutes early.

## 2019-03-13 NOTE — Patient Instructions (Addendum)
Thank you for visiting Dr. Carlis Abbott at [facility]. We recommend the following: Orders Placed This Encounter  Procedures  . DG Chest 2 View   Orders Placed This Encounter  Procedures  . DG Chest 2 View    Standing Status:   Future    Number of Occurrences:   1    Standing Expiration Date:   05/11/2020    Order Specific Question:   Reason for Exam (SYMPTOM  OR DIAGNOSIS REQUIRED)    Answer:   pleural effusion evaluation    Order Specific Question:   Preferred imaging location?    Answer:   Internal    Order Specific Question:   Radiology Contrast Protocol - do NOT remove file path    Answer:   [email or web address]    Meds ordered this encounter  Medications  . apixaban (ELIQUIS) 5 MG TABS tablet    Sig: Take 2 tablets (10mg ) twice daily for 7 days, then 1 tablet (5mg ) twice daily    Dispense:  60 tablet    Refill:  0  . apixaban (ELIQUIS) 5 MG TABS tablet    Sig: Take 1 tablet (5 mg total) by mouth 2 (two) times daily.    Dispense:  60 tablet    Refill:  11    Return in about 4 weeks (around 04/10/2019).   Take lovenox two times daily through Sunday morning before your surgery on Monday.  Do not take the lovenox on Sunday night or Monday morning before your procedure. After your surgery on Monday, take 10mg  of Eliquis on Monday night. You do not need to keep taking the lovenox once you start the Eliquis.  For the first week of Eliquis take 10mg  (2 pills). After 1 week, you will decrease to 1 pill (5mg ) two times daily.    Please do your part to reduce the spread of COVID-19.

## 2019-03-14 ENCOUNTER — Encounter (HOSPITAL_COMMUNITY): Payer: Self-pay | Admitting: *Deleted

## 2019-03-14 ENCOUNTER — Other Ambulatory Visit: Payer: Self-pay

## 2019-03-14 ENCOUNTER — Telehealth: Payer: Self-pay | Admitting: Hematology

## 2019-03-14 NOTE — Telephone Encounter (Signed)
Sarah [name] has been rescheduled to see Dr. Irene Limbo on 10/28 at 3:20pm. Appt date and time has been provided to the pt's daughter, [name]

## 2019-03-14 NOTE — Progress Notes (Addendum)
Sarah [name] ask me to speak to her daughter Sarah [name]. Sarah [name] reported that [patient has had a little shortness of breath, she can walk to the mail box and back without experiencing shortness of breathy- 20 yards each way - she walks unassisted.  Patient denies chest pain. Patient has edema legs and feet and abdomen.  Sarah [name] is on Levonox 2 times a day and has been since hospitalization earlier this month. Sarah [name] has instructions to stop 03/16/2019 am, this instruction is from [place], [employer]. Patient will begin Eliquis 03/16/2019. Sarah Buchan continues to have a rash all over her body- she uses Kenolog cream tid.  I instructed Sarah [name] to not apply Kenolog cream after shower Monday am.  I spoke with Dr.Moser regarding PE's,. Pumonary infusion, DVT- on Lovenox for these.  Lovenox to be stopped Sunday am.  No new orders,

## 2019-03-14 NOTE — Progress Notes (Signed)
Mrs Cagley ask me to speak to [name]. [name] reported that [patient has had a little shortness of breath, she can walk to the mail box and back without experiencing shortness of breathy- 20 yards each way - she walks unassisted.  Patient denies chest pain. Mrs Juanita Craver is on Levonox 2 times a day and has been since hospitalization earlier this month. [name] has instructions to stop 03/16/2019 am, this instruction is from [facility], Patient will begin Eliquis 03/16/2019.  Mrs Goedecke has an appointment for an Echo soon, order by cardiologist, Dr Buford Dresser.  Mrs Mcconnon 's pulmonologist is Dr Noemi Chapel. PCP is Dr Lajean Manes.

## 2019-03-15 ENCOUNTER — Encounter (INDEPENDENT_AMBULATORY_CARE_PROVIDER_SITE_OTHER): Payer: Self-pay

## 2019-03-15 LAB — NOVEL CORONAVIRUS, NAA (HOSP ORDER, SEND-OUT TO REF LAB; TAT 18-24 HRS): SARS-CoV-2, NAA: NOT DETECTED

## 2019-03-16 NOTE — Anesthesia Preprocedure Evaluation (Addendum)
Anesthesia Evaluation  Patient identified by MRN, date of birth, ID band Patient awake    Reviewed: Allergy & Precautions, NPO status , Patient's Chart, lab work & pertinent test results  History of Anesthesia Complications (+) PONV and history of anesthetic complications  Airway Mallampati: II   Neck ROM: full  Mouth opening: Limited Mouth Opening  Dental  (+) Teeth Intact, Dental Advidsory Given   Pulmonary shortness of breath,    breath sounds clear to auscultation       Cardiovascular hypertension, + dysrhythmias  Rhythm:Regular Rate:Normal     Neuro/Psych Anxiety    GI/Hepatic negative GI ROS, Neg liver ROS,   Endo/Other  Hypothyroidism   Renal/GU negative Renal ROS     Musculoskeletal  (+) Arthritis ,   Abdominal   Peds  Hematology   Anesthesia Other Findings   Reproductive/Obstetrics                           Anesthesia Physical Anesthesia Plan  ASA: III  Anesthesia Plan: General   Post-op Pain Management:    Induction:   PONV Risk Score and Plan:   Airway Management Planned: Oral ETT  Additional Equipment:   Intra-op Plan:   Post-operative Plan: Possible Post-op intubation/ventilation  Informed Consent:     Dental Advisory Given  Plan Discussed with: Anesthesiologist  Anesthesia Plan Comments:        Anesthesia Quick Evaluation

## 2019-03-17 ENCOUNTER — Encounter (HOSPITAL_COMMUNITY): Payer: Self-pay | Admitting: General Practice

## 2019-03-17 ENCOUNTER — Ambulatory Visit (HOSPITAL_COMMUNITY): Payer: Medicare Other | Admitting: Anesthesiology

## 2019-03-17 ENCOUNTER — Observation Stay (HOSPITAL_COMMUNITY)
Admission: RE | Admit: 2019-03-17 | Discharge: 2019-03-18 | Disposition: A | Discharge status: Home / Self Care | Payer: Medicare Other | Attending: Internal Medicine | Admitting: Internal Medicine

## 2019-03-17 ENCOUNTER — Other Ambulatory Visit: Payer: Self-pay

## 2019-03-17 ENCOUNTER — Encounter (HOSPITAL_COMMUNITY)
Admission: RE | Disposition: A | Discharge status: Home / Self Care | Payer: Self-pay | Source: Home / Self Care | Attending: Internal Medicine

## 2019-03-17 DIAGNOSIS — Z86711 Personal history of pulmonary embolism: Secondary | ICD-10-CM | POA: Diagnosis not present

## 2019-03-17 DIAGNOSIS — Z923 Personal history of irradiation: Secondary | ICD-10-CM | POA: Insufficient documentation

## 2019-03-17 DIAGNOSIS — Z7951 Long term (current) use of inhaled steroids: Secondary | ICD-10-CM | POA: Insufficient documentation

## 2019-03-17 DIAGNOSIS — Z7901 Long term (current) use of anticoagulants: Secondary | ICD-10-CM | POA: Insufficient documentation

## 2019-03-17 DIAGNOSIS — Z8249 Family history of ischemic heart disease and other diseases of the circulatory system: Secondary | ICD-10-CM | POA: Insufficient documentation

## 2019-03-17 DIAGNOSIS — K5730 Diverticulosis of large intestine without perforation or abscess without bleeding: Secondary | ICD-10-CM | POA: Diagnosis not present

## 2019-03-17 DIAGNOSIS — Z853 Personal history of malignant neoplasm of breast: Secondary | ICD-10-CM | POA: Insufficient documentation

## 2019-03-17 DIAGNOSIS — L282 Other prurigo: Secondary | ICD-10-CM | POA: Diagnosis not present

## 2019-03-17 DIAGNOSIS — R599 Enlarged lymph nodes, unspecified: Secondary | ICD-10-CM | POA: Diagnosis present

## 2019-03-17 DIAGNOSIS — Z888 Allergy status to other drugs, medicaments and biological substances status: Secondary | ICD-10-CM | POA: Insufficient documentation

## 2019-03-17 DIAGNOSIS — Z885 Allergy status to narcotic agent status: Secondary | ICD-10-CM | POA: Insufficient documentation

## 2019-03-17 DIAGNOSIS — E039 Hypothyroidism, unspecified: Secondary | ICD-10-CM | POA: Diagnosis not present

## 2019-03-17 DIAGNOSIS — H409 Unspecified glaucoma: Secondary | ICD-10-CM | POA: Insufficient documentation

## 2019-03-17 DIAGNOSIS — I2699 Other pulmonary embolism without acute cor pulmonale: Secondary | ICD-10-CM | POA: Diagnosis not present

## 2019-03-17 DIAGNOSIS — E785 Hyperlipidemia, unspecified: Secondary | ICD-10-CM | POA: Insufficient documentation

## 2019-03-17 DIAGNOSIS — C865 Angioimmunoblastic T-cell lymphoma: Principal | ICD-10-CM | POA: Insufficient documentation

## 2019-03-17 DIAGNOSIS — I700 Atherosclerosis of aorta: Secondary | ICD-10-CM | POA: Insufficient documentation

## 2019-03-17 DIAGNOSIS — M1990 Unspecified osteoarthritis, unspecified site: Secondary | ICD-10-CM | POA: Insufficient documentation

## 2019-03-17 DIAGNOSIS — R21 Rash and other nonspecific skin eruption: Secondary | ICD-10-CM | POA: Diagnosis not present

## 2019-03-17 DIAGNOSIS — I119 Hypertensive heart disease without heart failure: Secondary | ICD-10-CM | POA: Diagnosis not present

## 2019-03-17 DIAGNOSIS — I10 Essential (primary) hypertension: Secondary | ICD-10-CM | POA: Diagnosis not present

## 2019-03-17 DIAGNOSIS — C8598 Non-Hodgkin lymphoma, unspecified, lymph nodes of multiple sites: Secondary | ICD-10-CM | POA: Diagnosis not present

## 2019-03-17 DIAGNOSIS — L299 Pruritus, unspecified: Secondary | ICD-10-CM | POA: Diagnosis not present

## 2019-03-17 DIAGNOSIS — J90 Pleural effusion, not elsewhere classified: Secondary | ICD-10-CM | POA: Diagnosis not present

## 2019-03-17 DIAGNOSIS — R188 Other ascites: Secondary | ICD-10-CM | POA: Diagnosis not present

## 2019-03-17 DIAGNOSIS — F419 Anxiety disorder, unspecified: Secondary | ICD-10-CM | POA: Diagnosis not present

## 2019-03-17 DIAGNOSIS — Z86718 Personal history of other venous thrombosis and embolism: Secondary | ICD-10-CM | POA: Insufficient documentation

## 2019-03-17 DIAGNOSIS — D2371 Other benign neoplasm of skin of right lower limb, including hip: Secondary | ICD-10-CM | POA: Diagnosis not present

## 2019-03-17 DIAGNOSIS — Z91040 Latex allergy status: Secondary | ICD-10-CM | POA: Insufficient documentation

## 2019-03-17 DIAGNOSIS — R591 Generalized enlarged lymph nodes: Secondary | ICD-10-CM | POA: Diagnosis not present

## 2019-03-17 DIAGNOSIS — Z79899 Other long term (current) drug therapy: Secondary | ICD-10-CM | POA: Insufficient documentation

## 2019-03-17 DIAGNOSIS — Z800 Family history of malignant neoplasm of digestive organs: Secondary | ICD-10-CM | POA: Insufficient documentation

## 2019-03-17 DIAGNOSIS — R221 Localized swelling, mass and lump, neck: Secondary | ICD-10-CM | POA: Diagnosis not present

## 2019-03-17 HISTORY — PX: EXCISION MASS NECK: SHX6703

## 2019-03-17 HISTORY — DX: Dyspnea, unspecified: R06.00

## 2019-03-17 HISTORY — PX: SKIN BIOPSY: SHX1

## 2019-03-17 HISTORY — DX: Edema, unspecified: R60.9

## 2019-03-17 SURGERY — EXCISION, MASS, NECK
Anesthesia: General | Site: Thigh | Laterality: Left

## 2019-03-17 MED ORDER — VITAMIN D3 25 MCG (1000 UNIT) PO TABS
2000.0000 [IU] | ORAL_TABLET | Freq: Two times a day (BID) | ORAL | Status: DC
  Administered 2019-03-17 – 2019-03-18 (×2): 2000 [IU] via ORAL
  Filled 2019-03-17 (×5): qty 2

## 2019-03-17 MED ORDER — FENTANYL CITRATE (PF) 100 MCG/2ML IJ SOLN
25.0000 ug | INTRAMUSCULAR | Status: DC | PRN
  Administered 2019-03-17: 09:00:00 25 ug via INTRAVENOUS

## 2019-03-17 MED ORDER — PHENYLEPHRINE HCL (PRESSORS) 10 MG/ML IV SOLN
INTRAVENOUS | Status: DC | PRN
  Administered 2019-03-17: 80 ug via INTRAVENOUS
  Administered 2019-03-17: 40 ug via INTRAVENOUS

## 2019-03-17 MED ORDER — HEMOSTATIC AGENTS (NO CHARGE) OPTIME
TOPICAL | Status: DC | PRN
  Administered 2019-03-17: 1 via TOPICAL

## 2019-03-17 MED ORDER — BACITRACIN ZINC 500 UNIT/GM EX OINT
TOPICAL_OINTMENT | CUTANEOUS | Status: AC
  Filled 2019-03-17: qty 28.35

## 2019-03-17 MED ORDER — TRIAMCINOLONE 0.1 % CREAM:EUCERIN CREAM 1:1
TOPICAL_CREAM | Freq: Two times a day (BID) | CUTANEOUS | Status: DC | PRN
  Administered 2019-03-18: 10:00:00 via TOPICAL
  Filled 2019-03-17: qty 1

## 2019-03-17 MED ORDER — VITAMIN B-12 1000 MCG PO TABS
3000.0000 ug | ORAL_TABLET | Freq: Every day | ORAL | Status: DC
  Administered 2019-03-18: 3000 ug via ORAL
  Filled 2019-03-17: qty 3

## 2019-03-17 MED ORDER — ACETAMINOPHEN 650 MG RE SUPP: 650.0000 mg | RECTAL | Status: DC | PRN

## 2019-03-17 MED ORDER — APIXABAN 5 MG PO TABS: 5.0000 mg | ORAL_TABLET | Freq: Two times a day (BID) | ORAL | Status: DC

## 2019-03-17 MED ORDER — ACETAMINOPHEN 160 MG/5ML PO SOLN
650.0000 mg | ORAL | Status: DC | PRN
  Administered 2019-03-17: 650 mg via ORAL
  Filled 2019-03-17: qty 20.3

## 2019-03-17 MED ORDER — LEVOTHYROXINE SODIUM 25 MCG PO TABS
25.0000 ug | ORAL_TABLET | Freq: Every day | ORAL | Status: DC
  Administered 2019-03-18: 25 ug via ORAL
  Filled 2019-03-17: qty 1

## 2019-03-17 MED ORDER — OLOPATADINE HCL 0.1 % OP SOLN
1.0000 [drp] | Freq: Two times a day (BID) | OPHTHALMIC | Status: DC
  Administered 2019-03-17 – 2019-03-18 (×2): 1 [drp] via OPHTHALMIC
  Filled 2019-03-17: qty 5

## 2019-03-17 MED ORDER — SODIUM CHLORIDE 0.9 % IV SOLN
INTRAVENOUS | Status: DC | PRN
  Administered 2019-03-17: 50 ug/min via INTRAVENOUS

## 2019-03-17 MED ORDER — SUCCINYLCHOLINE CHLORIDE 20 MG/ML IJ SOLN
INTRAMUSCULAR | Status: DC | PRN
  Administered 2019-03-17: 100 mg via INTRAVENOUS

## 2019-03-17 MED ORDER — AMLODIPINE BESYLATE 2.5 MG PO TABS
5.0000 mg | ORAL_TABLET | Freq: Every day | ORAL | Status: DC
  Administered 2019-03-18: 5 mg via ORAL
  Filled 2019-03-17 (×2): qty 2

## 2019-03-17 MED ORDER — ROCURONIUM BROMIDE 100 MG/10ML IV SOLN
INTRAVENOUS | Status: DC | PRN
  Administered 2019-03-17: 40 mg via INTRAVENOUS

## 2019-03-17 MED ORDER — ONDANSETRON HCL 4 MG PO TABS: 4.0000 mg | ORAL_TABLET | Freq: Four times a day (QID) | ORAL | Status: DC | PRN

## 2019-03-17 MED ORDER — LIDOCAINE-EPINEPHRINE 1 %-1:100000 IJ SOLN
INTRAMUSCULAR | Status: AC
  Filled 2019-03-17: qty 1

## 2019-03-17 MED ORDER — STERILE WATER FOR IRRIGATION IR SOLN
Status: DC | PRN
  Administered 2019-03-17: 1000 mL

## 2019-03-17 MED ORDER — LIDOCAINE 2% (20 MG/ML) 5 ML SYRINGE
INTRAMUSCULAR | Status: DC | PRN
  Administered 2019-03-17: 75 mg via INTRAVENOUS

## 2019-03-17 MED ORDER — SUGAMMADEX SODIUM 200 MG/2ML IV SOLN
INTRAVENOUS | Status: DC | PRN
  Administered 2019-03-17: 200 mg via INTRAVENOUS

## 2019-03-17 MED ORDER — ALBUTEROL SULFATE (2.5 MG/3ML) 0.083% IN NEBU: 2.5000 mg | INHALATION_SOLUTION | Freq: Four times a day (QID) | RESPIRATORY_TRACT | Status: DC | PRN

## 2019-03-17 MED ORDER — ALBUTEROL SULFATE (2.5 MG/3ML) 0.083% IN NEBU
2.5000 mg | INHALATION_SOLUTION | Freq: Four times a day (QID) | RESPIRATORY_TRACT | Status: DC | PRN
  Administered 2019-03-17: 2.5 mg via RESPIRATORY_TRACT

## 2019-03-17 MED ORDER — ONDANSETRON HCL 4 MG PO TABS: 4.0000 mg | ORAL_TABLET | ORAL | Status: DC | PRN

## 2019-03-17 MED ORDER — LACTATED RINGERS IV SOLN
INTRAVENOUS | Status: DC | PRN
  Administered 2019-03-17: 07:00:00 via INTRAVENOUS

## 2019-03-17 MED ORDER — FENTANYL CITRATE (PF) 250 MCG/5ML IJ SOLN
INTRAMUSCULAR | Status: AC
  Filled 2019-03-17: qty 5

## 2019-03-17 MED ORDER — HYDROXYZINE HCL 25 MG PO TABS: 25.0000 mg | ORAL_TABLET | Freq: Every evening | ORAL | Status: DC | PRN

## 2019-03-17 MED ORDER — AMLODIPINE BESYLATE 2.5 MG PO TABS
2.5000 mg | ORAL_TABLET | Freq: Every day | ORAL | Status: DC
  Administered 2019-03-17: 2.5 mg via ORAL
  Filled 2019-03-17: qty 1

## 2019-03-17 MED ORDER — APIXABAN 5 MG PO TABS
10.0000 mg | ORAL_TABLET | Freq: Two times a day (BID) | ORAL | Status: DC
  Administered 2019-03-17 – 2019-03-18 (×2): 10 mg via ORAL
  Filled 2019-03-17 (×2): qty 2

## 2019-03-17 MED ORDER — LIDOCAINE-EPINEPHRINE 1 %-1:100000 IJ SOLN
INTRAMUSCULAR | Status: DC | PRN
  Administered 2019-03-17: 8 mL

## 2019-03-17 MED ORDER — BACITRACIN ZINC 500 UNIT/GM EX OINT
1.0000 "application " | TOPICAL_OINTMENT | Freq: Three times a day (TID) | CUTANEOUS | Status: DC
  Administered 2019-03-17 – 2019-03-18 (×3): 1 via TOPICAL
  Filled 2019-03-17: qty 28.35

## 2019-03-17 MED ORDER — CALCIUM CARBONATE 1250 (500 CA) MG PO TABS
1250.0000 mg | ORAL_TABLET | Freq: Every day | ORAL | Status: DC
  Administered 2019-03-18: 1250 mg via ORAL
  Filled 2019-03-17: qty 1

## 2019-03-17 MED ORDER — ACETAMINOPHEN 650 MG RE SUPP: 650.0000 mg | Freq: Four times a day (QID) | RECTAL | Status: DC | PRN

## 2019-03-17 MED ORDER — FLUTICASONE FUROATE-VILANTEROL 100-25 MCG/INH IN AEPB
1.0000 | INHALATION_SPRAY | Freq: Every day | RESPIRATORY_TRACT | Status: DC
  Filled 2019-03-17 (×3): qty 28

## 2019-03-17 MED ORDER — FLUTICASONE PROPIONATE 50 MCG/ACT NA SUSP
1.0000 | Freq: Every day | NASAL | Status: DC
  Administered 2019-03-18: 1 via NASAL
  Filled 2019-03-17: qty 16

## 2019-03-17 MED ORDER — ONDANSETRON HCL 4 MG/2ML IJ SOLN: 4.0000 mg | INTRAMUSCULAR | Status: DC | PRN

## 2019-03-17 MED ORDER — FENTANYL CITRATE (PF) 100 MCG/2ML IJ SOLN
INTRAMUSCULAR | Status: AC
  Administered 2019-03-17: 25 ug via INTRAVENOUS
  Filled 2019-03-17: qty 2

## 2019-03-17 MED ORDER — BENZONATATE 100 MG PO CAPS
200.0000 mg | ORAL_CAPSULE | Freq: Three times a day (TID) | ORAL | Status: DC
  Administered 2019-03-17 – 2019-03-18 (×3): 200 mg via ORAL
  Filled 2019-03-17 (×3): qty 2

## 2019-03-17 MED ORDER — PANTOPRAZOLE SODIUM 40 MG PO TBEC
40.0000 mg | DELAYED_RELEASE_TABLET | Freq: Every day | ORAL | Status: DC
  Administered 2019-03-18: 40 mg via ORAL
  Filled 2019-03-17: qty 1

## 2019-03-17 MED ORDER — DEXTROSE-NACL 5-0.9 % IV SOLN
INTRAVENOUS | Status: DC
  Administered 2019-03-17: 14:00:00 via INTRAVENOUS

## 2019-03-17 MED ORDER — ACETAMINOPHEN 325 MG PO TABS: 650.0000 mg | ORAL_TABLET | Freq: Four times a day (QID) | ORAL | Status: DC | PRN

## 2019-03-17 MED ORDER — FENTANYL CITRATE (PF) 100 MCG/2ML IJ SOLN
INTRAMUSCULAR | Status: DC | PRN
  Administered 2019-03-17: 25 ug via INTRAVENOUS
  Administered 2019-03-17: 50 ug via INTRAVENOUS
  Administered 2019-03-17: 25 ug via INTRAVENOUS

## 2019-03-17 MED ORDER — ALBUTEROL SULFATE (2.5 MG/3ML) 0.083% IN NEBU
INHALATION_SOLUTION | RESPIRATORY_TRACT | Status: AC
  Filled 2019-03-17: qty 3

## 2019-03-17 MED ORDER — TRIAMCINOLONE ACETONIDE 0.1 % EX CREA
1.0000 "application " | TOPICAL_CREAM | Freq: Three times a day (TID) | CUTANEOUS | Status: DC
  Administered 2019-03-17 – 2019-03-18 (×2): 1 via TOPICAL
  Filled 2019-03-17: qty 15

## 2019-03-17 MED ORDER — ONDANSETRON HCL 4 MG/2ML IJ SOLN
INTRAMUSCULAR | Status: DC | PRN
  Administered 2019-03-17: 4 mg via INTRAVENOUS

## 2019-03-17 MED ORDER — PHENOL 1.4 % MT LIQD: 1.0000 | OROMUCOSAL | Status: DC | PRN

## 2019-03-17 MED ORDER — LORATADINE 10 MG PO TABS
10.0000 mg | ORAL_TABLET | Freq: Every day | ORAL | Status: DC
  Administered 2019-03-18: 10 mg via ORAL
  Filled 2019-03-17: qty 1

## 2019-03-17 MED ORDER — PROPOFOL 10 MG/ML IV BOLUS
INTRAVENOUS | Status: AC
  Filled 2019-03-17: qty 20

## 2019-03-17 MED ORDER — FENTANYL CITRATE (PF) 100 MCG/2ML IJ SOLN: 25.0000 ug | INTRAMUSCULAR | Status: DC | PRN

## 2019-03-17 MED ORDER — EPHEDRINE SULFATE 50 MG/ML IJ SOLN
INTRAMUSCULAR | Status: DC | PRN
  Administered 2019-03-17: 10 mg via INTRAVENOUS

## 2019-03-17 MED ORDER — ONDANSETRON HCL 4 MG/2ML IJ SOLN: 4.0000 mg | Freq: Four times a day (QID) | INTRAMUSCULAR | Status: DC | PRN

## 2019-03-17 MED ORDER — CEFAZOLIN SODIUM-DEXTROSE 2-4 GM/100ML-% IV SOLN
2.0000 g | INTRAVENOUS | Status: AC
  Administered 2019-03-17: 2 g via INTRAVENOUS

## 2019-03-17 MED ORDER — 0.9 % SODIUM CHLORIDE (POUR BTL) OPTIME
TOPICAL | Status: DC | PRN
  Administered 2019-03-17: 1000 mL

## 2019-03-17 MED ORDER — DEXAMETHASONE SODIUM PHOSPHATE 10 MG/ML IJ SOLN
INTRAMUSCULAR | Status: DC | PRN
  Administered 2019-03-17: 10 mg via INTRAVENOUS

## 2019-03-17 MED ORDER — PROPOFOL 10 MG/ML IV BOLUS
INTRAVENOUS | Status: DC | PRN
  Administered 2019-03-17: 140 mg via INTRAVENOUS

## 2019-03-17 MED ORDER — CEFAZOLIN SODIUM-DEXTROSE 2-4 GM/100ML-% IV SOLN
INTRAVENOUS | Status: AC
  Filled 2019-03-17: qty 100

## 2019-03-17 MED ORDER — ZOLPIDEM TARTRATE 5 MG PO TABS
2.5000 mg | ORAL_TABLET | Freq: Every day | ORAL | Status: DC
  Administered 2019-03-17: 2.5 mg via ORAL
  Filled 2019-03-17: qty 1

## 2019-03-17 SURGICAL SUPPLY — 44 items
ADH SKN CLS APL DERMABOND .7 (GAUZE/BANDAGES/DRESSINGS) ×4
BAND INSRT 18 STRL LF DISP RB (MISCELLANEOUS) ×2
BAND RUBBER #18 3X1/16 STRL (MISCELLANEOUS) ×3 IMPLANT
BLADE SURG 15 STRL LF DISP TIS (BLADE) ×2 IMPLANT
BLADE SURG 15 STRL SS (BLADE) ×3
BNDG GAUZE ELAST 4 BULKY (GAUZE/BANDAGES/DRESSINGS) IMPLANT
CANISTER SUCT 3000ML PPV (MISCELLANEOUS) ×3 IMPLANT
CLEANER TIP ELECTROSURG 2X2 (MISCELLANEOUS) ×3 IMPLANT
CONT SPEC 4OZ CLIKSEAL STRL BL (MISCELLANEOUS) ×6 IMPLANT
CORD BIPOLAR FORCEPS 12FT (ELECTRODE) ×3 IMPLANT
COVER SURGICAL LIGHT HANDLE (MISCELLANEOUS) ×3 IMPLANT
COVER WAND RF STERILE (DRAPES) ×3 IMPLANT
DERMABOND ADVANCED (GAUZE/BANDAGES/DRESSINGS) ×2
DERMABOND ADVANCED .7 DNX12 (GAUZE/BANDAGES/DRESSINGS) ×4 IMPLANT
DRAPE HALF SHEET 40X57 (DRAPES) ×3 IMPLANT
DRAPE INCISE IOBAN 66X45 STRL (DRAPES) ×3 IMPLANT
ELECT COATED BLADE 2.86 ST (ELECTRODE) ×3 IMPLANT
ELECT REM PT RETURN 9FT ADLT (ELECTROSURGICAL) ×3
ELECTRODE REM PT RTRN 9FT ADLT (ELECTROSURGICAL) ×2 IMPLANT
FORCEPS BIPOLAR SPETZLER 8 1.0 (NEUROSURGERY SUPPLIES) ×3 IMPLANT
GLOVE BIO SURGEON STRL SZ 6.5 (GLOVE) ×3 IMPLANT
GOWN STRL REUS W/ TWL LRG LVL3 (GOWN DISPOSABLE) ×4 IMPLANT
GOWN STRL REUS W/TWL LRG LVL3 (GOWN DISPOSABLE) ×6
HEMOSTAT ARISTA ABSORB 3G PWDR (HEMOSTASIS) ×3 IMPLANT
KIT BASIN OR (CUSTOM PROCEDURE TRAY) ×3 IMPLANT
KIT TURNOVER KIT B (KITS) ×3 IMPLANT
NEEDLE HYPO 25GX1X1/2 BEV (NEEDLE) IMPLANT
NS IRRIG 1000ML POUR BTL (IV SOLUTION) ×3 IMPLANT
PAD ARMBOARD 7.5X6 YLW CONV (MISCELLANEOUS) ×6 IMPLANT
PENCIL BUTTON HOLSTER BLD 10FT (ELECTRODE) ×3 IMPLANT
STAPLER VISISTAT 35W (STAPLE) ×3 IMPLANT
SUT ETHILON 3 0 PS 1 (SUTURE) ×3 IMPLANT
SUT ETHILON 5 0 PS 2 18 (SUTURE) ×3 IMPLANT
SUT PLAIN GUT FAST 5-0 (SUTURE) ×3 IMPLANT
SUT PROLENE 3 0 PS 1 (SUTURE) ×3 IMPLANT
SUT PROLENE 3 0 PS 2 (SUTURE) ×3 IMPLANT
SUT SILK 3 0 REEL (SUTURE) ×3 IMPLANT
SUT VIC AB 3-0 PS2 18 (SUTURE) ×3
SUT VIC AB 3-0 PS2 18XBRD (SUTURE) ×2 IMPLANT
SUT VIC AB 4-0 P-3 18X BRD (SUTURE) ×2 IMPLANT
SUT VIC AB 4-0 P3 18 (SUTURE) ×3
TOWEL GREEN STERILE (TOWEL DISPOSABLE) ×3 IMPLANT
TRAY ENT MC OR (CUSTOM PROCEDURE TRAY) ×3 IMPLANT
WATER STERILE IRR 1000ML POUR (IV SOLUTION) ×3 IMPLANT

## 2019-03-17 NOTE — H&P (Signed)
The surgical history remains accurate and without interval change. The condition still exists which makes the procedure necessary. The patient and/or family is aware of their condition and has been informed of the risks and benefits of surgery, as well as alternatives. All parties have elected to proceed with surgery.   Surgical plan: excisional biopsy left neck nodes, skin biopsy left thigh   Helayne Seminole, MD

## 2019-03-17 NOTE — Progress Notes (Signed)
   ENT Progress Note:  s/p Procedure(s): EXCISION MASS NECK Skin Biopsy Left Thigh   Subjective: Stable, no bleeding  Objective: Vital signs in last 24 hours: Temp:  [97 F (36.1 C)-97.7 F (36.5 C)] 97.7 F (36.5 C) (10/26 1400) Pulse Rate:  [73-98] 82 (10/26 1400) Resp:  [15-22] 16 (10/26 1400) BP: (97-189)/(49-165) 110/72 (10/26 1400) SpO2:  [92 %-98 %] 94 % (10/26 1400) Weight:  [54.6 kg-56.4 kg] 54.6 kg (10/26 1400) Weight change:  Last BM Date: 03/15/19  Intake/Output from previous day: No intake/output data recorded. Intake/Output this shift: Total I/O In: 1224 [P.O.:360; I.V.:714; Other:50; IV Piggyback:100] Out: 53 [Blood:50]  Labs: No results for input(s): WBC, HGB, HCT, PLT in the last 72 hours. No results for input(s): NA, K, CL, CO2, GLUCOSE, BUN, CALCIUM in the last 72 hours.  Invalid input(s): CREATININR  Studies/Results: No results found.   PHYSICAL EXAM: Inc intact drain inplace   Assessment/Plan: Pt stable Monitor o/n    Jerrell Belfast 03/17/2019, 5:57 PM

## 2019-03-17 NOTE — H&P (Signed)
History and Physical    Sarah [name] TZG:[id number] DOB: 08/14/1932 DOA: 03/17/2019  Referring MD/NP/PA: Lind Guest, MD PCP: Lajean Manes, MD  Patient coming from: Home  Chief Complaint:   I have personally briefly reviewed patient's old medical records in [place]   HPI: Sarah [name] is a 85 y.o. female with medical history significant of hypertension, hypothyroidism, hyperlipidemia, and breast cancer in 2013.  She presented for excisional biopsy of left neck lymph nodes and skin biopsy of the left thigh.  She recently been hospitalized from 10/15-10/17,  found to have submassive PE after presenting with acute shortness of breath.  The CT scan of the chest, abdomen, and pelvis from 10/8 revealed extensive diffuse LAD, new subpleural subpleural pulmonary nodules, 83mm oropharyngeal mass, and extensive lymphadenopathy concerning for lymphoma.  During the hospitalization patient was seen by cardiology, pulmonology, and ENT.  She had followed up with Dr. Carlis Abbott pulmonology  4 days ago.  Plan was for patient to skip taking her evening dose of Lovenox the night before the procedure and the morning of.  She is to resume anticoagulation the night of the procedure, starting Eliquis 10 mg twice daily x1 week, starting after her procedure.  After the first week she will continue 5 mg twice daily.  If malignancy is confirmed, she will continue anticoagulation for the duration of her malignancy, at least 6 months.  Following the procedures this morning patient reports having some sore throat, but denies any other complaints at this time.    ED Course: As seen above  Review of systems: A complete 10 point review of systems was performed and negative except for as noted above in HPI.  Past Medical History:  Diagnosis Date  . Allergy    codeine, thiazides  . Anxiety    new dx  . Arthritis   . Breast cancer ([place]) 03/14/12   bx=right breast=Ductal carcinoma in situ  w/calcifications,ER/PR=+,upper inner quad  . Cancer ([place])    breast  . Cataract   . DVT (deep venous thrombosis) ([place]) 02/2019   left leg  . Dyspnea   . Edema    both legs feet and toe, abdomen  . Glaucoma    laser treated years ago  . HOH (hard of hearing)   . Hyperlipemia   . Hypertension   . Hypothyroidism   . Pancreatic cyst    benign  . PONV (postoperative nausea and vomiting)   . Pulmonary embolism ([place]) 02/2019   bilateral   . Radiation 06/11/2012-07/12/2012   17 sessions 4250 cGy, 3 sessions 750 cGy  . Vertigo   . Wears glasses   . Wears partial dentures    partial upper    Past Surgical History:  Procedure Laterality Date  . ABDOMINAL HYSTERECTOMY  1966   1/2 ovary left in   . BREAST SURGERY  1992   lumpectomy-lt  . CATARACT EXTRACTION     b/l  . COLONOSCOPY    . EYE SURGERY Bilateral    bilateral cataract removal  . JOINT REPLACEMENT  2013   rt total knee  . JOINT REPLACEMENT  1995   lt total knee  . PARTIAL MASTECTOMY WITH NEEDLE LOCALIZATION  04/09/2012   Procedure: PARTIAL MASTECTOMY WITH NEEDLE LOCALIZATION;  Surgeon: Adin Hector, MD;  Location: [facility];  Service: General;  Laterality: Right;  . TONSILLECTOMY    . TOTAL KNEE ARTHROPLASTY  08/16/2011   Procedure: TOTAL KNEE ARTHROPLASTY;  Surgeon: Ninetta Lights, MD;  Location: [facility];  Service: Orthopedics;  Laterality: Right;     reports that she has never smoked. She has never used smokeless tobacco. She reports that she does not drink alcohol or use drugs.  Allergies  Allergen Reactions  . Codeine Other (See Comments)    Reaction not recalled  . Latex   . Thiazide-Type Diuretics Other (See Comments)    Blurry vision?? (patient stated she has macular degeneration)    Family History  Problem Relation Age of Onset  . Heart disease Father   . Cancer Maternal Aunt        stomach    Prior to Admission medications   Medication Sig Start Date End Date Taking?  Authorizing Provider  amLODipine (NORVASC) 2.5 MG tablet Take 2.5-5 mg by mouth See admin instructions. 5 mg in the morning, 2.5 mg in the evening 12/21/18  Yes [provider]  benzonatate (TESSALON) 200 MG capsule Take 1 capsule (200 mg total) by mouth 3 (three) times daily. 03/08/19  Yes Mercy Riding, MD  Calcium Carbonate (CALCIUM 600 PO) Take 600 mg by mouth daily.   Yes [provider]  cetirizine (ZYRTEC) 10 MG tablet Take 10 mg by mouth daily.  01/23/19  Yes [provider]  Cholecalciferol (VITAMIN D) 50 MCG (2000 UT) tablet Take 2,000 Units by mouth 2 (two) times daily.   Yes [provider]  Cyanocobalamin (VITAMIN B-12 PO) Take 3,000 mcg by mouth daily.    Yes [provider]  fluticasone (FLONASE) 50 MCG/ACT nasal spray Place 1 spray into both nostrils daily. 03/09/19  Yes Gonfa, Charlesetta Ivory, MD  fluticasone furoate-vilanterol (BREO ELLIPTA) 100-25 MCG/INH AEPB Inhale 1 puff into the lungs daily. 03/07/19  Yes Mercy Riding, MD  Krill Oil (OMEGA-3) 500 MG CAPS Take 500 mg by mouth daily.   Yes [provider]  levothyroxine (SYNTHROID) 25 MCG tablet Take 25 mcg by mouth daily before breakfast.  09/20/18  Yes [provider]  Multiple Vitamins-Minerals (PRESERVISION AREDS PO) Take 1 capsule by mouth 2 (two) times daily.    Yes [provider]  olopatadine (PATANOL) 0.1 % ophthalmic solution Place 1 drop into both eyes 2 (two) times daily.   Yes [provider]  pantoprazole (PROTONIX) 40 MG tablet Take 1 tablet (40 mg total) by mouth daily. 03/08/19 09/04/19 Yes Mercy Riding, MD  triamcinolone cream (KENALOG) 0.1 % Apply 1 application topically 3 (three) times daily.  02/26/19  Yes [provider]  vitamin E 400 UNIT capsule Take 400 Units by mouth daily.   Yes [provider]  zolpidem (AMBIEN) 10 MG tablet Take 2.5 mg by mouth at bedtime.  07/24/14  Yes [provider]  apixaban (ELIQUIS) 5  MG TABS tablet Take 2 tablets (10mg ) twice daily for 7 days, then 1 tablet (5mg ) twice daily 03/13/19   Noemi Chapel P, DO  apixaban (ELIQUIS) 5 MG TABS tablet Take 1 tablet (5 mg total) by mouth 2 (two) times daily. 04/07/19   Noemi Chapel P, DO  enoxaparin (LOVENOX) 60 MG/0.6ML injection Inject 0.6 mLs (60 mg total) into the skin every 12 (twelve) hours for 14 days. 03/07/19 03/21/19  Candee Furbish, MD  hydrOXYzine (ATARAX/VISTARIL) 25 MG tablet Take 1 tablet (25 mg total) by mouth at bedtime as needed for itching. 03/08/19   Mercy Riding, MD    Physical Exam:  Constitutional: Elderly female who appears to be in NAD and able to follow commands. Vitals:  03/17/19 0639  BP: 115/70  Pulse: 95  Resp: 20  Temp: 97.7 F (36.5 C)  TempSrc: Oral  SpO2: 98%  Weight: 56.4 kg  Height: 5\' 4"  (1.626 m)   Eyes: PERRL, lids and conjunctivae normal ENMT: Mucous membranes are dry. Posterior pharynx clear of any exudate or lesions.   Neck: Left neck surgical incision with sutures present. Respiratory: Overall aeration mildly decreased.  Some mild crackles noted in the lower lung fields. No significant wheezes or rhonchi appreciated.  Maintaining O2 saturations on room air. Cardiovascular: Regular rate and rhythm, no murmurs / rubs / gallops. No extremity edema. 2+ pedal pulses. No carotid bruits.  Abdomen: no tenderness, no masses palpated. No hepatosplenomegaly. Bowel sounds positive.  Musculoskeletal: no clubbing / cyanosis. No joint deformity upper and lower extremities. Good ROM, no contractures. Normal muscle tone.  Skin: no rashes, lesions, ulcers. No induration Neurologic: CN 2-12 grossly intact. Sensation intact, DTR normal. Strength 5/5 in all 4.  Psychiatric: Normal judgment and insight. Alert and oriented x 3. Normal mood.     Labs on Admission: I have personally reviewed following labs and imaging studies  CBC: No results for input(s): WBC, NEUTROABS, HGB, HCT, MCV, PLT in the  last 168 hours. Basic Metabolic Panel: No results for input(s): NA, K, CL, CO2, GLUCOSE, BUN, CREATININE, CALCIUM, MG, PHOS in the last 168 hours. GFR: Estimated Creatinine Clearance: 45.2 mL/min (by C-G formula based on SCr of 0.68 mg/dL). Liver Function Tests: No results for input(s): AST, ALT, ALKPHOS, BILITOT, PROT, ALBUMIN in the last 168 hours. No results for input(s): LIPASE, AMYLASE in the last 168 hours. No results for input(s): AMMONIA in the last 168 hours. Coagulation Profile: No results for input(s): INR, PROTIME in the last 168 hours. Cardiac Enzymes: No results for input(s): CKTOTAL, CKMB, CKMBINDEX, TROPONINI in the last 168 hours. BNP (last 3 results) No results for input(s): PROBNP in the last 8760 hours. HbA1C: No results for input(s): HGBA1C in the last 72 hours. CBG: No results for input(s): GLUCAP in the last 168 hours. Lipid Profile: No results for input(s): CHOL, HDL, LDLCALC, TRIG, CHOLHDL, LDLDIRECT in the last 72 hours. Thyroid Function Tests: No results for input(s): TSH, T4TOTAL, FREET4, T3FREE, THYROIDAB in the last 72 hours. Anemia Panel: No results for input(s): VITAMINB12, FOLATE, FERRITIN, TIBC, IRON, RETICCTPCT in the last 72 hours. Urine analysis:    Component Value Date/Time   COLORURINE YELLOW 04/09/2012 [place] 04/09/2012 1202   LABSPEC 1.011 04/09/2012 1202   PHURINE 7.0 04/09/2012 1202   GLUCOSEU NEGATIVE 04/09/2012 1202   HGBUR NEGATIVE 04/09/2012 1202   BILIRUBINUR NEGATIVE 04/09/2012 1202   KETONESUR NEGATIVE 04/09/2012 1202   PROTEINUR NEGATIVE 04/09/2012 1202   UROBILINOGEN 0.2 04/09/2012 1202   NITRITE NEGATIVE 04/09/2012 1202   LEUKOCYTESUR TRACE (A) 04/09/2012 1202   Sepsis Labs: Recent Results (from the past 240 hour(s))  Novel Coronavirus, NAA (Hosp order, Send-out to Ref Lab; TAT 18-24 hrs     Status: None   Collection Time: 03/13/19 12:40 PM   Specimen: Nasopharyngeal Swab; Respiratory  Result Value  Ref Range Status   SARS-CoV-2, NAA NOT DETECTED NOT DETECTED Final    Comment: (NOTE) This nucleic acid amplification test was developed and its performance characteristics determined by [employer]. Nucleic acid amplification tests include PCR and TMA. This test has not been FDA cleared or approved. This test has been authorized by FDA under an Emergency Use Authorization (EUA). This test is only authorized for  the duration of time the declaration that circumstances exist justifying the authorization of the emergency use of in vitro diagnostic tests for detection of SARS-CoV-2 virus and/or diagnosis of COVID-19 infection under section 564(b)(1) of the Act, 21 U.S.C. 952WUX-3(K) (1), unless the authorization is terminated or revoked sooner. When diagnostic testing is negative, the possibility of a false negative result should be considered in the context of a patient's recent exposures and the presence of clinical signs and symptoms consistent with COVID-19. An individual without symptoms of COVID- 19 and who is not shedding SARS-CoV-2 vi rus would expect to have a negative (not detected) result in this assay. Performed At: [facility] [address] [place], [place] [id number] Rush Farmer MD DG:[id number]    [place]  Final    Comment: Performed at [facility], [place] [address] [place], [place] [address]     Radiological Exams on Admission: No results found.   Assessment/Plan Lymphadenopathy of head and neck: Patient status post left neck lymph node biopsy. -Admit to MedSurg bed -Check CBC and BMP(patient refused initial blood draws) -Follow-up lymph node biopsy -Appreciate Dr. Blenda Nicely consultative services, we will follow for any further recommendation  Pruritic rash: Patient noted nonspecific rash of the body that was pruritic in nature.  Suspected that it is secondary to possible lymphoma. -Continue hydroxyzine and  triamcinolone cream as needed -Follow-up skin biopsy  Recent pulmonary embolus: Patient had initially presented to the hospital on 10/15 with shortness of breath.  Imaging submassive PE with signs of right heart strain.  Patient had been initially started on heparin drip and transition to Lovenox at discharge.  Pulmonology recommended patient to start Eliquis 10 mg twice daily postprocedure x1 week, and then transition to 5 mg twice daily. -Start Eliquis 10 mg twice daily tonight  Essential hypertension: Blood pressures currently maintained -Continue amlodipine  Hypothyroidism -Continue levothyroxine  History of breast cancer  DVT prophylaxis: Eliquis Code Status: DNR Family Communication: No family present at bedside Disposition Plan: Possible discharge home in a.m. Consults called: Dr. Blenda Nicely Admission status: Observation  Norval Morton MD [facility] Pager [phone]   If 7PM-7AM, please contact night-coverage [email or web address] Password [email or web address]  03/17/2019, 8:55 AM

## 2019-03-17 NOTE — Transfer of Care (Signed)
Immediate Anesthesia Transfer of Care Note  Patient: Sarah [name]  Procedure(s) Performed: EXCISION MASS NECK (Left Neck) Skin Biopsy Left Thigh (Left Thigh)  Patient Location: PACU  Anesthesia Type:General  Level of Consciousness: awake, alert  and oriented  Airway & Oxygen Therapy: Patient Spontanous Breathing and Patient connected to face mask oxygen  Post-op Assessment: Report given to RN, Post -op Vital signs reviewed and stable and Patient moving all extremities X 4  Post vital signs: Reviewed and stable  Last Vitals:  Vitals Value Taken Time  BP 189/165 03/17/19 0854  Temp    Pulse 93 03/17/19 0854  Resp 20 03/17/19 0854  SpO2 96 % 03/17/19 0854  Vitals shown include unvalidated device data.  Last Pain:  Vitals:   03/17/19 0639  TempSrc: Oral  PainSc: 0-No pain      Patients Stated Pain Goal: 3 (61/22/44 9753)  Complications: No apparent anesthesia complications

## 2019-03-17 NOTE — Progress Notes (Addendum)
Patient refused blood draws and c/o pain on her IV site, daughter at bedside said to stop any more IV and blood draws.

## 2019-03-17 NOTE — Anesthesia Postprocedure Evaluation (Signed)
Anesthesia Post Note  Patient: Sarah [name]  Procedure(s) Performed: EXCISION MASS NECK (Left Neck) Skin Biopsy Left Thigh (Left Thigh)     Patient location during evaluation: PACU Anesthesia Type: General Level of consciousness: awake Pain management: pain level controlled Vital Signs Assessment: post-procedure vital signs reviewed and stable Respiratory status: spontaneous breathing Cardiovascular status: stable Postop Assessment: no apparent nausea or vomiting Anesthetic complications: no    Last Vitals:  Vitals:   03/17/19 0940 03/17/19 0955  BP: (!) 97/49 (!) 104/58  Pulse: 85 89  Resp: (!) 21 20  Temp:  (!) 36.1 C  SpO2: 97% 94%    Last Pain:  Vitals:   03/17/19 0926  TempSrc:   PainSc: Asleep                 Tyneshia Stivers

## 2019-03-17 NOTE — Anesthesia Postprocedure Evaluation (Signed)
Anesthesia Post Note  Patient: Sarah [name]  Procedure(s) Performed: EXCISION MASS NECK (Left Neck) Skin Biopsy Left Thigh (Left Thigh)     Patient location during evaluation: PACU Anesthesia Type: General Level of consciousness: awake Pain management: pain level controlled Vital Signs Assessment: post-procedure vital signs reviewed and stable Respiratory status: spontaneous breathing Cardiovascular status: stable Postop Assessment: no apparent nausea or vomiting    Last Vitals:  Vitals:   03/17/19 1325 03/17/19 1400  BP: 116/70 110/72  Pulse: 82 82  Resp: 16 16  Temp: (!) 36.2 C 36.5 C  SpO2: 93% 94%    Last Pain:  Vitals:   03/17/19 1225  TempSrc:   PainSc: Asleep                 Glynda Soliday

## 2019-03-17 NOTE — Discharge Instructions (Addendum)
-  Cleanse wound with 1/2 strength hydrogen peroxide on a Q tip twice daily. Pat dry. Apply bacitracin ointment along incision.  -You may shower like you normally would, keeping the left neck drain site covered until it closes. Do not scrub over incision. Pat dry.  -No strenuous exercise or heavy lifting until 14 days after surgery. Walk/ambulate at home often. Perform calf and stretch exercises often to prevent blood clot formation.  -Tylenol and ice packs as needed for pain.   Information on my medicine - ELIQUIS (apixaban)  This medication education was reviewed with me or my healthcare representative as part of my discharge preparation.    Why was Eliquis prescribed for you? Eliquis was prescribed to treat blood clots that may have been found in the veins of your legs (deep vein thrombosis) or in your lungs (pulmonary embolism) and to reduce the risk of them occurring again.  What do You need to know about Eliquis ? The starting dose is 10 mg (two 5 mg tablets) taken TWICE daily for the FIRST SEVEN (7) DAYS, then on 03/24/19 in the evening  the dose is reduced to ONE 5 mg tablet taken TWICE daily.  Eliquis may be taken with or without food.   Try to take the dose about the same time in the morning and in the evening. If you have difficulty swallowing the tablet whole please discuss with your pharmacist how to take the medication safely.  Take Eliquis exactly as prescribed and DO NOT stop taking Eliquis without talking to the doctor who prescribed the medication.  Stopping may increase your risk of developing a new blood clot.  Refill your prescription before you run out.  After discharge, you should have regular check-up appointments with your healthcare provider that is prescribing your Eliquis.    What do you do if you miss a dose? If a dose of ELIQUIS is not taken at the scheduled time, take it as soon as possible on the same day and twice-daily administration should be resumed.  The dose should not be doubled to make up for a missed dose.  Important Safety Information A possible side effect of Eliquis is bleeding. You should call your healthcare provider right away if you experience any of the following: ? Bleeding from an injury or your nose that does not stop. ? Unusual colored urine (red or dark brown) or unusual colored stools (red or black). ? Unusual bruising for unknown reasons. ? A serious fall or if you hit your head (even if there is no bleeding).  Some medicines may interact with Eliquis and might increase your risk of bleeding or clotting while on Eliquis. To help avoid this, consult your healthcare provider or pharmacist prior to using any new prescription or non-prescription medications, including herbals, vitamins, non-steroidal anti-inflammatory drugs (NSAIDs) and supplements.  This website has more information on Eliquis (apixaban): http://www.eliquis.com/eliquis/home

## 2019-03-17 NOTE — Op Note (Signed)
DATE OF PROCEDURE: 03/17/2019   PRE-OPERATIVE DIAGNOSIS: left neck mass Skin rash, pruritic, thighs   POST-OPERATIVE DIAGNOSIS: Same   PROCEDURE(S): Excisional biopsy left neck lymph nodes Skin biopsy left thigh Closure, left thigh wound, measuring 2.5 cm x 1 cm with undermining   SURGEON:  Gavin Pound, MD    ASSISTANT(S): none    ANESTHESIA: General endotracheal anesthesia     ESTIMATED BLOOD LOSS: 57mL  SPECIMENS: Left neck lymph nodes for lymphoma protocol Left thigh skin biopsy   COMPLICATIONS: None    OPERATIVE FINDINGS: Multiple other superficial enlarged lymph nodes in the left neck that were just underneath the platysma were excised readily and with minimal bleeding.  3 of these lymph nodes were removed completely and sent off for lymphoma protocol in the pathology lab. An area of skin about 1 cm, and an ellipse shaped fashion was taken from the skin affected by her pruritic rash on the left lower thigh couple inches above the knee.   OPERATIVE DETAILS: The patient was brought to the operating room and placed in the supine position.  General anesthesia was induced.  A timeout was performed.  A small shoulder roll was placed.  The head was turned to the right side of the bed.  The patient was prepped and draped in the usual sterile fashion.  1% lidocaine with 1:100,000 epinephrine was injected subcutaneously.  A horizontal incision was made in the left neck 2 fingerbreadths below the mandible to protect the marginal mandibular nerve.  Incision was carried through the platysma and very minimal subplatysmal flaps were raised until the palpable lymph nodes adjacent to the external jugular vein were encountered.  These were dissected bluntly off the field.  There were 3 lymph nodes in this region which were excised entirely and sent off to pathology.  Hemostasis was achieved with bipolar cautery and [place].  Valsalva was performed to ensure hemostasis.  Rubber  band drain was placed and secured to the skin with a 3-0 nylon.  Platysma was closed with buried interrupted 4-0 Vicryl.  Dermis was closed with buried interrupted 4-0 Vicryl.  Skin was closed with simple interrupted 5-0 fast-absorbing gut.  Next, attention was turned to the left thigh.  A vertical ellipse shaped incision was designed.  Local was again injected subcutaneously.  The skin biopsy was obtained in an ellipse shaped fashion with a 15 blade.  This was excised sharply from the subcutaneous layer with scissors.  The specimen was passed off the field.  There was no bleeding.  Next, double hooks were utilized along with sharp dissection to raise subcutaneous flaps to allow for closure.  Vertical mattress sutures with 3-0 Prolene were then placed in the wound.  Skin was cleansed and bacitracin was applied to both wound sites.  All instrumentation was removed.  The patient was returned to the care of the anesthesia staff, awakened and transported to PACU in good condition.  She will be admitted to the hospitalist service for further management of her anticoagulation plan given her recent DVT and PE.   Helayne Seminole, MD

## 2019-03-17 NOTE — Anesthesia Procedure Notes (Signed)
Procedure Name: Intubation Date/Time: 03/17/2019 7:40 AM Performed by: Neldon Newport, CRNA Pre-anesthesia Checklist: Timeout performed, Patient being monitored, Suction available, Emergency Drugs available and Patient identified Patient Re-evaluated:Patient Re-evaluated prior to induction Oxygen Delivery Method: Circle system utilized Preoxygenation: Pre-oxygenation with 100% oxygen Induction Type: IV induction and Rapid sequence Ventilation: Mask ventilation without difficulty Laryngoscope Size: Mac and 3 Grade View: Grade I Tube type: Oral Tube size: 7.0 mm Number of attempts: 2 Placement Confirmation: ETT inserted through vocal cords under direct vision,  breath sounds checked- equal and bilateral and CO2 detector Secured at: 22 cm Tube secured with: Tape Dental Injury: Teeth and Oropharynx as per pre-operative assessment

## 2019-03-18 ENCOUNTER — Encounter (HOSPITAL_COMMUNITY): Payer: Self-pay | Admitting: Otolaryngology

## 2019-03-18 ENCOUNTER — Other Ambulatory Visit: Payer: Self-pay | Admitting: Oncology

## 2019-03-18 DIAGNOSIS — I2699 Other pulmonary embolism without acute cor pulmonale: Secondary | ICD-10-CM | POA: Diagnosis not present

## 2019-03-18 DIAGNOSIS — E785 Hyperlipidemia, unspecified: Secondary | ICD-10-CM | POA: Diagnosis not present

## 2019-03-18 DIAGNOSIS — E039 Hypothyroidism, unspecified: Secondary | ICD-10-CM | POA: Diagnosis not present

## 2019-03-18 DIAGNOSIS — R21 Rash and other nonspecific skin eruption: Secondary | ICD-10-CM | POA: Diagnosis not present

## 2019-03-18 DIAGNOSIS — F419 Anxiety disorder, unspecified: Secondary | ICD-10-CM | POA: Diagnosis not present

## 2019-03-18 DIAGNOSIS — I2609 Other pulmonary embolism with acute cor pulmonale: Secondary | ICD-10-CM | POA: Diagnosis not present

## 2019-03-18 DIAGNOSIS — R591 Generalized enlarged lymph nodes: Secondary | ICD-10-CM | POA: Diagnosis not present

## 2019-03-18 DIAGNOSIS — C865 Angioimmunoblastic T-cell lymphoma: Secondary | ICD-10-CM | POA: Diagnosis not present

## 2019-03-18 LAB — FLOW CYTOMETRY REQUEST - FLUID (INPATIENT)

## 2019-03-18 MED ORDER — BACITRACIN ZINC 500 UNIT/GM EX OINT: 1.0000 "application " | TOPICAL_OINTMENT | Freq: Three times a day (TID) | CUTANEOUS | 0 refills | Status: DC

## 2019-03-18 NOTE — Discharge Summary (Addendum)
Physician Discharge Summary  Sarah [name] TIR:[id number] DOB: [birth year] DOA: 03/17/2019  PCP: Lajean Manes, MD  Admit date: 03/17/2019 Discharge date: 03/18/2019  Time spent: 45 minutes  Recommendations for Outpatient Follow-up:  Follow up with ENT as scheduled next week Follow up with pulmonary 1 month for echo   Discharge Diagnoses:  Principal Problem:   Lymphadenopathy of head and neck Active Problems:   Acute pulmonary embolism ([place])   History of pulmonary embolus (PE)   Hypertension   Pruritic rash   Lymph nodes enlarged   Hypothyroidism   Discharge Condition: stable  Diet recommendation: regular  Filed Weights   03/17/19 0639 03/17/19 1400  Weight: 56.4 kg 54.6 kg    History of present illness:   Sarah [name] is a 85 y.o. female with medical history significant of hypertension, hypothyroidism, hyperlipidemia, and breast cancer in 2013.  She presented 10/26 for excisional biopsy of left neck lymph nodes and skin biopsy of the left thigh.  She was hospitalized from 10/15-10/17,  found to have submassive PE after presenting with acute shortness of breath.  The CT scan of the chest, abdomen, and pelvis from 10/8 revealed extensive diffuse LAD, new subpleural  pulmonary nodules, 46mm oropharyngeal mass, and extensive lymphadenopathy concerning for lymphoma.  During the hospitalization patient was seen by cardiology, pulmonology, and ENT.   She had followed up with Dr. Carlis Abbott pulmonology  4 days prior to ENT procedure.  Plan was for patient to skip  her evening dose of Lovenox the night before the procedure and the morning of.  She is to resume anticoagulation the night of the procedure, starting Eliquis 10 mg twice daily x1 week, starting after her procedure.  After the first week she will continue 5 mg twice daily.  If malignancy is confirmed, she will continue anticoagulation for the duration of her malignancy, at least 6 months.  Hospital Course:  Lymphadenopathy  of head and neck: Patient status post left neck lymph node biopsy.CBC and BMP ordered but patient refused initial blood draws. Admitted for obs for hematoma/bleeding post op as anticoag resumed. On day of discharge no hematoma or obvious bleeding. Has ENT follow up apt next week. Will need to follow biopsy. Appreciate Dr. Blenda Nicely consultative services.n   Pruritic rash: Patient noted nonspecific rash of the body that was pruritic in nature.  Suspected that it is secondary to possible lymphoma. Continue hydroxyzine and triamcinolone cream as needed. Follow-up skin biopsy   Recent pulmonary embolus: Patient had initially presented to the hospital on 10/15 with shortness of breath.  Imaging submassive PE with signs of right heart strain.  Patient had been initially started on heparin drip and transition to Lovenox at discharge.  Pulmonology recommended patient to start Eliquis 10 mg twice daily postprocedure x1 week, and then transition to 5 mg twice daily. Start Eliquised 10 mg twice daily 10/26. Will follow up with pulmonology 1 month for echo to evaluate right heart strain   Essential hypertension: Blood pressures maintained.   Hypothyroidism. Continue levothyroxine   History of breast cancer  Procedures: Excision mass neck Skin biopsy left thing Consultations: [facility]  Discharge Exam: Vitals:   03/18/19 0353 03/18/19 0752  BP: 114/76 109/62  Pulse: 83 88  Resp: 18   Temp: 97.7 F (36.5 C)   SpO2: 95% 96%    General: ambulating in hall with steady gait Cardiovascular: rrr no mgr no LE edema Respiratory: normal effort BS clear bilaterally no wheeze Neck: dressing dry and intact. Incision  clean and dry. No erythema or hematoma  Discharge Instructions   Discharge Instructions     Call MD for:  difficulty breathing, headache or visual disturbances   Complete by: As directed    Call MD for:  persistant dizziness or light-headedness   Complete by: As directed    Call MD  for:  persistant nausea and vomiting   Complete by: As directed    Call MD for:  redness, tenderness, or signs of infection (pain, swelling, redness, odor or green/yellow discharge around incision site)   Complete by: As directed    Call MD for:  temperature >100.4   Complete by: As directed    Diet - low sodium heart healthy   Complete by: As directed    Discharge instructions   Complete by: As directed    Follow up with ENT as scheduled Take medications as prescribed   Increase activity slowly   Complete by: As directed       Allergies as of 03/18/2019       Reactions   Codeine Other (See Comments)   Reaction not recalled   Latex    Thiazide-type Diuretics Other (See Comments)   Blurry vision?? (patient stated she has macular degeneration)        Medication List     STOP taking these medications    enoxaparin 60 MG/0.6ML injection Commonly known as: LOVENOX       TAKE these medications    amLODipine 2.5 MG tablet Commonly known as: NORVASC Take 2.5-5 mg by mouth See admin instructions. 5 mg in the morning, 2.5 mg in the evening   apixaban 5 MG Tabs tablet Commonly known as: Eliquis Take 2 tablets (10mg ) twice daily for 7 days, then 1 tablet (5mg ) twice daily   apixaban 5 MG Tabs tablet Commonly known as: Eliquis Take 1 tablet (5 mg total) by mouth 2 (two) times daily. Start taking on:    bacitracin ointment Apply 1 application topically every 8 (eight) hours.   benzonatate 200 MG capsule Commonly known as: TESSALON Take 1 capsule (200 mg total) by mouth 3 (three) times daily.   CALCIUM 600 PO Take 600 mg by mouth daily.   cetirizine 10 MG tablet Commonly known as: ZYRTEC Take 10 mg by mouth daily.   fluticasone 50 MCG/ACT nasal spray Commonly known as: FLONASE Place 1 spray into both nostrils daily.   fluticasone furoate-vilanterol 100-25 MCG/INH Aepb Commonly known as: BREO ELLIPTA Inhale 1 puff into the lungs daily.   hydrOXYzine 25 MG  tablet Commonly known as: ATARAX/VISTARIL Take 1 tablet (25 mg total) by mouth at bedtime as needed for itching.   levothyroxine 25 MCG tablet Commonly known as: SYNTHROID Take 25 mcg by mouth daily before breakfast.   olopatadine 0.1 % ophthalmic solution Commonly known as: PATANOL Place 1 drop into both eyes 2 (two) times daily.   Omega-3 500 MG Caps Take 500 mg by mouth daily.   pantoprazole 40 MG tablet Commonly known as: Protonix Take 1 tablet (40 mg total) by mouth daily.   PRESERVISION AREDS PO Take 1 capsule by mouth 2 (two) times daily.   triamcinolone cream 0.1 % Commonly known as: KENALOG Apply 1 application topically 3 (three) times daily.   VITAMIN B-12 PO Take 3,000 mcg by mouth daily.   Vitamin D 50 MCG (2000 UT) tablet Take 2,000 Units by mouth 2 (two) times daily.   vitamin E 400 UNIT capsule Take 400 Units by mouth daily.  zolpidem 10 MG tablet Commonly known as: AMBIEN Take 2.5 mg by mouth at bedtime.       Allergies  Allergen Reactions   Codeine Other (See Comments)    Reaction not recalled   Latex    Thiazide-Type Diuretics Other (See Comments)    Blurry vision?? (patient stated she has macular degeneration)   Follow-up Information     Helayne Seminole, MD. Schedule an appointment as soon as possible for a visit in 10 days.   Specialty: Otolaryngology Why: For suture removal, For wound re-check Contact information: [address]  [place] [address] [phone]             The results of significant diagnostics from this hospitalization (including imaging, microbiology, ancillary and laboratory) are listed below for reference.    Significant Diagnostic Studies: Dg Mastoid 3 Views  Result Date: 02/17/2019 CLINICAL DATA:  Fever, not be hind left ear with mastoid pain. EXAM: MASTOID - 3 + VIEW COMPARISON:  None FINDINGS: No cortical erosion of the mastoid is noted. Mastoid air cells are clear bilaterally  on plain film radiography. No visible soft tissue abnormality is demonstrated on radiographs. There is asymmetry with respect to density perhaps indicating sclerosis near the middle ear on the left. IMPRESSION: Question asymmetric mastoid sclerosis, potentially projectional but given symptom CT may be helpful for further assessment. Electronically Signed   By: Zetta Bills M.D.   On: 02/17/2019 12:01   Dg Chest 2 View  Result Date: 03/13/2019 CLINICAL DATA:  Follow-up pleural effusion EXAM: CHEST - 2 VIEW COMPARISON:  03/06/2019 FINDINGS: Cardiac shadow is stable. Aortic calcifications are again seen. Small bilateral pleural effusions are again noted and stable. Right basilar consolidation is noted increased from the prior exam. No other focal infiltrate is seen. IMPRESSION: Increasing right basilar consolidation. Stable pleural effusions bilaterally. Electronically Signed   By: Inez Catalina M.D.   On: 03/13/2019 11:07   Dg Chest 2 View  Result Date: 02/17/2019 CLINICAL DATA:  Dry cough, shortness of breath. EXAM: CHEST - 2 VIEW COMPARISON:  August 10, 2011 FINDINGS: There is interruption of the normal tracheal air column at the thoracic inlet. No buckling of the trachea is seen distally. Lungs are well expanded and there are postoperative changes projecting over the right inferior chest likely within the breast. Small bilateral pleural effusions are noted. Cardiomediastinal contours are stable. Added density is noted projecting over right inferior chest, potentially a pulmonary nodule. Atherosclerotic changes are noted throughout the aorta. No acute bone finding. IMPRESSION: Bilateral effusions in this patient with history of breast cancer, of uncertain significance with question of right lower lobe pulmonary nodule, consider chest CT for further assessment. Unusual appearance of tracheal air column at the thoracic inlet. Correlate with any history of swallowing difficulty or changes in voice with  dedicated imaging with chest CT and or CT of the neck as indicated. Atherosclerotic changes in the thoracic aorta. These results will be called to the ordering clinician or representative by the Radiologist Assistant, and communication documented in the PACS or zVision Dashboard. Electronically Signed   By: Zetta Bills M.D.   On: 02/17/2019 12:07   Ct Soft Tissue Neck W Contrast  Result Date: 02/27/2019 CLINICAL DATA:  Abnormal appearance of the trachea on chest radiographs. Abdominal bloating and cough for 6 weeks. History of breast cancer. EXAM: CT NECK WITH CONTRAST TECHNIQUE: Multidetector CT imaging of the neck was performed using the standard protocol following the bolus administration of  intravenous contrast. CONTRAST:  160mL ISOVUE-300 IOPAMIDOL (ISOVUE-300) INJECTION 61% COMPARISON:  None. FINDINGS: Pharynx and larynx: There is an 11 x 8 x 16 mm exophytic mucosal mass in the superior oropharynx on the left (series 11, image 39). There is also an 8 x 4 mm enhancing mucosal/submucosal lesion more inferiorly in the oropharynx involving the anterior aspect of the left palatine tonsil (series 11, image 49). There is moderate distension of the oropharyngeal airway and hypopharynx. The trachea is widely patent without a focal abnormality identified to account for the appearance on chest radiographs which was likely projectional. Salivary glands: Multiple small enhancing parotid masses measuring up to 10 mm in size bilaterally, likely enlarged intraparotid lymph nodes. Unremarkable submandibular glands. Thyroid: Diffuse thyroid heterogeneity with mild asymmetric enlargement of the left lobe. Lymph nodes: There are numerous mildly enlarged lymph nodes throughout all stations of the neck bilaterally with the largest measuring 10 mm in short axis in level II. Vascular: Major vascular structures of the neck are patent. Limited intracranial: Unremarkable. Visualized orbits: Bilateral cataract extraction. Mastoids  and visualized paranasal sinuses: Paranasal sinuses and mastoid air cells are clear. Skeleton: No suspicious osseous lesion. Subcentimeter sclerotic focus in the T4 vertebral body is unchanged from a 09/06/2011 chest CT and likely benign. Bilateral facet ankylosis at C2-3 and C3-4. Severe facet arthrosis at C4-5 with grade 1 anterolisthesis. Advanced disc degeneration in the lower cervical spine. Upper chest: Reported separately. Other: None. IMPRESSION: 1. Bilateral cervical lymphadenopathy most concerning for lymphoma given findings on separate chest, abdomen, and pelvis CT today. 2. 16 mm left superior oropharyngeal mass and 8 mm mucosal/submucosal lesion more inferiorly involving the anterior left tonsil. Recommend ENT referral and direct visualization. Electronically Signed   By: Logan Bores M.D.   On: 02/27/2019 17:13   Ct Chest W Contrast  Result Date: 02/27/2019 CLINICAL DATA:  Abnormal chest radiographs, effusions, abnormal appearance of the trachea EXAM: CT CHEST, ABDOMEN, AND PELVIS WITH CONTRAST TECHNIQUE: Multidetector CT imaging of the chest, abdomen and pelvis was performed following the standard protocol during bolus administration of intravenous contrast. CONTRAST:  184mL ISOVUE-300 IOPAMIDOL (ISOVUE-300) INJECTION 61%, additional oral enteric contrast COMPARISON:  Chest radiograph, 02/17/2019, CT chest, 09/06/2011, MR abdomen, 02/23/2012 FINDINGS: CT CHEST FINDINGS Cardiovascular: Aortic atherosclerosis. Cardiomegaly. No pericardial effusion. Mediastinum/Nodes: Numerous bilateral axillary and subpectoral lymph nodes, largest left axillary nodes measuring 1.8 x 1.0 cm (series 2, image 10, 16). Numerous enlarged pretracheal lymph nodes, largest node measuring 1.4 x 1.1 cm (series 2, image 17) enlarged, heterogeneous thyroid. Trachea, and esophagus demonstrate no significant findings. Lungs/Pleura: Moderate right, small left pleural effusions with associated atelectasis or consolidation.  Subpleural radiation fibrosis of the anterior right lung. There is a new subpleural pulmonary nodule of the anterior right middle lobe measuring 6 mm (series 4, image 105). Musculoskeletal: Postoperative findings of the right breast. CT ABDOMEN PELVIS FINDINGS Hepatobiliary: No solid liver abnormality is seen. No gallstones, gallbladder wall thickening, or biliary dilatation. Pancreas: There is a fluid attenuation lesion of the pancreatic uncinate measuring 2.4 x 1.3 cm (series 2, image 63). No pancreatic ductal dilatation or surrounding inflammatory changes. Spleen: Splenomegaly, maximum span 15 cm. Adrenals/Urinary Tract: Adrenal glands are unremarkable. Large exophytic cyst of the midportion of the left kidney. Bladder is unremarkable. Stomach/Bowel: Stomach is within normal limits. Appendix not clearly visualized. No evidence of bowel wall thickening, distention, or inflammatory changes. Severe sigmoid diverticulosis. Vascular/Lymphatic: Aortic atherosclerosis. Numerous enlarged mesenteric, periportal, retroperitoneal, iliac, pelvic sidewall, and inguinal lymph nodes, largest nodes  in the left retroperitoneum fairly bulky measuring 2.2 x 1.4 cm (series 2, image 67). Reproductive: Status post hysterectomy. Other: No abdominal wall hernia or abnormality. Small volume ascites. Musculoskeletal: There is generally osteopenic appearance of the skeleton. There are numerous subtle hypodense lesions, particularly of the pelvis (e.g. Series 2, image 88) and select vertebral bodies, for example L4 (series 8, image 92). IMPRESSION: 1. Extensive lymphadenopathy throughout the chest, abdomen, and pelvis, with index lymph nodes identified above. Splenomegaly. Findings are most consistent with lymphoma with recurrent, metastatic breast malignancy less favored. 2. There is generally osteopenic appearance of the skeleton. There are numerous subtle hypodense lesions, particularly of the pelvis (e.g. Series 2, image 88) and select  vertebral bodies, for example L4 (series 8, image 92). This is suspicious although not definitive for osseous metastatic disease. Characterization for metabolic activity by nuclear scintigraphic bone scan or PET-CT would be helpful to further evaluate. 3. Moderate right, small left pleural effusions with associated atelectasis or consolidation. Subpleural radiation fibrosis of the anterior right lung. 4. There is a new subpleural pulmonary nodule of the anterior right middle lobe measuring 6 mm (series 4, image 105), nonspecific. There is no obvious pleural thickening or nodularity definitive for metastatic disease. 5.  Small volume ascites. 6. There is a fluid attenuation lesion of the pancreatic uncinate measuring 2.4 x 1.3 cm (series 2, image 63), not substantially changed when compared to remote prior MR examination dated 02/23/2012 and likely sequelae of a prior pseudocyst versus incidental pancreatic IPMN. 7.  Cardiomegaly. 8. Other chronic and incidental findings as detailed above. Aortic Atherosclerosis (ICD10-I70.0). Electronically Signed   By: Eddie Candle M.D.   On: 02/27/2019 16:52   Ct Angio Chest Pe W And/or Wo Contrast  Addendum Date: 03/06/2019   ADDENDUM REPORT: 03/06/2019 09:17 ADDENDUM: Comment: No dominant thyroid lesions. Electronically Signed   By: Lowella Grip III M.D.   On: 03/06/2019 09:17   Result Date: 03/06/2019 CLINICAL DATA:  Shortness of breath.  History of breast carcinoma EXAM: CT ANGIOGRAPHY CHEST WITH CONTRAST TECHNIQUE: Multidetector CT imaging of the chest was performed using the standard protocol during bolus administration of intravenous contrast. Multiplanar CT image reconstructions and MIPs were obtained to evaluate the vascular anatomy. CONTRAST:  159mL OMNIPAQUE IOHEXOL 350 MG/ML SOLN COMPARISON:  February 27, 2019 CT chest, abdomen, and pelvis; chest CT September 06, 2011 FINDINGS: Cardiovascular: There are pulmonary emboli arising from the distal most aspect of the  left main pulmonary artery with extension primarily into left lower lobe pulmonary arterial branches. There is incompletely obstructing pulmonary embolus as well in the left upper lobe pulmonary artery proximally. There is more peripheral pulmonary embolus in the right lower lobe region. The right ventricle to left ventricle diameter ratio is 1.6, indicative of right heart strain. There is no appreciable thoracic aortic aneurysm or dissection. The visualized great vessels show patchy areas of atherosclerotic calcification. There is extensive aortic atherosclerosis as well as scattered foci of coronary artery calcification. There is no pericardial effusion or pericardial thickening. Mediastinum/Nodes: There are multiple lymph nodes throughout the axillary and subpectoral regions. Most of these lymph nodes are subcentimeter. Several lymph nodes are borderline prominent, unchanged from 1 week prior. There are several prominent lymph nodes surrounding the mid to distal trachea, largest measuring 1.2 x 1.1 cm. There is no new adenopathy compared to 1 week prior. No esophageal lesions are evident. Lungs/Pleura: There is a large pleural effusion on the right with a much smaller pleural effusion on the  left. There is consolidation, largely due to compressive atelectasis throughout the right lower lobe. There is milder compressive atelectasis in the left base. There is a nodular opacity abutting the pleura in the anterior segment of the right upper lobe measuring 1.1 x 0.6 cm. A nodular opacity abutting the pleura in the right middle lobe measures 7 mm, better seen on recent prior study. Upper Abdomen: In the visualized upper abdomen, there is aortic atherosclerosis. Spleen is incompletely visualized but appears enlarged. Spleen is noted to be enlarged on recent CT abdomen study. There appears to be adenopathy in the upper retroperitoneum, incompletely visualized. Musculoskeletal: There is degenerative change in the thoracic  spine. No blastic or lytic bone lesions are evident. A bone island is noted in the T4 vertebral body, stable. Postoperative change in the right breast region noted. Review of the MIP images confirms the above findings. IMPRESSION: 1. Pulmonary embolus arising from the distal left main pulmonary artery with extension into multiple left lower lobe pulmonary arterial branches. There is also incompletely obstructing pulmonary embolus in the proximal left upper lobe pulmonary artery. More distal pulmonary embolus in the right lower lobe pulmonary artery. Positive for acute PE with CT evidence of right heart strain (RV/LV Ratio = 1.6) consistent with at least submassive (intermediate risk) PE. The presence of right heart strain has been associated with an increased risk of morbidity and mortality. Please activate Code PE by paging [phone]. 2. Sizable pleural effusion on the right with smaller pleural effusion on the left. Consolidation and compressive atelectasis throughout most of the right lower lobe. Milder atelectasis left base. Nodular opacities on the right, likely metastatic foci. 3.  Stable adenopathy compared to 1 week prior. 4. Enlargement of spleen, incompletely visualized but documented CT 1 week prior. Concern for potential lymphoma. 5. Aortic atherosclerosis. No aneurysm or dissection evident. Foci of coronary artery calcification noted. Aortic Atherosclerosis (ICD10-I70.0). Critical Value/emergent results were called by telephone at the time of interpretation on 03/06/2019 at 9:12 am to provider Dr. Ronnald Nian, ED physician, who verbally acknowledged these results. Electronically Signed: By: Lowella Grip III M.D. On: 03/06/2019 09:13   Ct Abdomen Pelvis W Contrast  Result Date: 02/27/2019 CLINICAL DATA:  Abnormal chest radiographs, effusions, abnormal appearance of the trachea EXAM: CT CHEST, ABDOMEN, AND PELVIS WITH CONTRAST TECHNIQUE: Multidetector CT imaging of the chest, abdomen and pelvis  was performed following the standard protocol during bolus administration of intravenous contrast. CONTRAST:  189mL ISOVUE-300 IOPAMIDOL (ISOVUE-300) INJECTION 61%, additional oral enteric contrast COMPARISON:  Chest radiograph, 02/17/2019, CT chest, 09/06/2011, MR abdomen, 02/23/2012 FINDINGS: CT CHEST FINDINGS Cardiovascular: Aortic atherosclerosis. Cardiomegaly. No pericardial effusion. Mediastinum/Nodes: Numerous bilateral axillary and subpectoral lymph nodes, largest left axillary nodes measuring 1.8 x 1.0 cm (series 2, image 10, 16). Numerous enlarged pretracheal lymph nodes, largest node measuring 1.4 x 1.1 cm (series 2, image 17) enlarged, heterogeneous thyroid. Trachea, and esophagus demonstrate no significant findings. Lungs/Pleura: Moderate right, small left pleural effusions with associated atelectasis or consolidation. Subpleural radiation fibrosis of the anterior right lung. There is a new subpleural pulmonary nodule of the anterior right middle lobe measuring 6 mm (series 4, image 105). Musculoskeletal: Postoperative findings of the right breast. CT ABDOMEN PELVIS FINDINGS Hepatobiliary: No solid liver abnormality is seen. No gallstones, gallbladder wall thickening, or biliary dilatation. Pancreas: There is a fluid attenuation lesion of the pancreatic uncinate measuring 2.4 x 1.3 cm (series 2, image 63). No pancreatic ductal dilatation or surrounding inflammatory changes. Spleen: Splenomegaly, maximum span 15 cm.  Adrenals/Urinary Tract: Adrenal glands are unremarkable. Large exophytic cyst of the midportion of the left kidney. Bladder is unremarkable. Stomach/Bowel: Stomach is within normal limits. Appendix not clearly visualized. No evidence of bowel wall thickening, distention, or inflammatory changes. Severe sigmoid diverticulosis. Vascular/Lymphatic: Aortic atherosclerosis. Numerous enlarged mesenteric, periportal, retroperitoneal, iliac, pelvic sidewall, and inguinal lymph nodes, largest nodes in  the left retroperitoneum fairly bulky measuring 2.2 x 1.4 cm (series 2, image 67). Reproductive: Status post hysterectomy. Other: No abdominal wall hernia or abnormality. Small volume ascites. Musculoskeletal: There is generally osteopenic appearance of the skeleton. There are numerous subtle hypodense lesions, particularly of the pelvis (e.g. Series 2, image 88) and select vertebral bodies, for example L4 (series 8, image 92). IMPRESSION: 1. Extensive lymphadenopathy throughout the chest, abdomen, and pelvis, with index lymph nodes identified above. Splenomegaly. Findings are most consistent with lymphoma with recurrent, metastatic breast malignancy less favored. 2. There is generally osteopenic appearance of the skeleton. There are numerous subtle hypodense lesions, particularly of the pelvis (e.g. Series 2, image 88) and select vertebral bodies, for example L4 (series 8, image 92). This is suspicious although not definitive for osseous metastatic disease. Characterization for metabolic activity by nuclear scintigraphic bone scan or PET-CT would be helpful to further evaluate. 3. Moderate right, small left pleural effusions with associated atelectasis or consolidation. Subpleural radiation fibrosis of the anterior right lung. 4. There is a new subpleural pulmonary nodule of the anterior right middle lobe measuring 6 mm (series 4, image 105), nonspecific. There is no obvious pleural thickening or nodularity definitive for metastatic disease. 5.  Small volume ascites. 6. There is a fluid attenuation lesion of the pancreatic uncinate measuring 2.4 x 1.3 cm (series 2, image 63), not substantially changed when compared to remote prior MR examination dated 02/23/2012 and likely sequelae of a prior pseudocyst versus incidental pancreatic IPMN. 7.  Cardiomegaly. 8. Other chronic and incidental findings as detailed above. Aortic Atherosclerosis (ICD10-I70.0). Electronically Signed   By: Eddie Candle M.D.   On: 02/27/2019  16:52   Dg Chest Port 1 View  Result Date: 03/06/2019 CLINICAL DATA:  Shortness of breath. EXAM: PORTABLE CHEST 1 VIEW COMPARISON:  CT 01/04/2019.  Chest x-ray 03/06/2019. FINDINGS: Cardiomegaly. No pulmonary venous congestion. Mild right base infiltrate. Bilateral pleural effusions. Right pleural effusion has improved from prior exam. No pneumothorax. Surgical clips over the right chest. IMPRESSION: 1.  Cardiomegaly.  No pulmonary venous congestion. 2. Mild right base infiltrate and bilateral pleural effusions again noted. Right pleural effusion has improved from prior exam. Electronically Signed   By: Marcello Moores  Register   On: 03/06/2019 12:38   Dg Chest Portable 1 View  Result Date: 03/06/2019 CLINICAL DATA:  85 year old female with shortness of breath. Recently diagnosed with lymphoma. EXAM: PORTABLE CHEST 1 VIEW COMPARISON:  CT Chest, Abdomen, and Pelvis 02/27/2019. FINDINGS: Portable AP semi upright view at 0208 hours. Moderate size right pleural effusion and small left pleural effusion appear not significantly changed. Stable cardiac size and mediastinal contours. Visualized tracheal air column is within normal limits. No pulmonary edema in the upper lungs. No pneumothorax or other confluent pulmonary opacity. No acute osseous abnormality identified. IMPRESSION: 1. Moderate size right and small left pleural effusions appear not significantly changed since 02/27/19. 2. No new cardiopulmonary abnormality. Electronically Signed   By: Genevie Ann M.D.   On: 03/06/2019 02:20   Vas [place] Lower Extremity Venous (dvt)  Result Date: 03/08/2019  Lower Venous Study Indications: Pulmonary embolism.  Risk Factors: Confirmed PE.  Limitations: Poor ultrasound/tissue interface. Comparison Study: No prior studies. Performing Technologist: Oliver Hum RVT  Examination Guidelines: A complete evaluation includes B-mode imaging, spectral Doppler, color Doppler, and power Doppler as needed of all accessible portions of  each vessel. Bilateral testing is considered an integral part of a complete examination. Limited examinations for reoccurring indications may be performed as noted.  +---------+---------------+---------+-----------+----------+--------------+ RIGHT    CompressibilityPhasicitySpontaneityPropertiesThrombus Aging +---------+---------------+---------+-----------+----------+--------------+ CFV      Full           Yes      Yes                                 +---------+---------------+---------+-----------+----------+--------------+ SFJ      Full                                                        +---------+---------------+---------+-----------+----------+--------------+ FV Prox  Full                                                        +---------+---------------+---------+-----------+----------+--------------+ FV Mid   Full                                                        +---------+---------------+---------+-----------+----------+--------------+ FV DistalFull                                                        +---------+---------------+---------+-----------+----------+--------------+ PFV      Full                                                        +---------+---------------+---------+-----------+----------+--------------+ POP      Full           Yes      Yes                                 +---------+---------------+---------+-----------+----------+--------------+ PTV      Full                                                        +---------+---------------+---------+-----------+----------+--------------+ PERO     Full                                                        +---------+---------------+---------+-----------+----------+--------------+   +---------+---------------+---------+-----------+----------+--------------+  LEFT     CompressibilityPhasicitySpontaneityPropertiesThrombus Aging  +---------+---------------+---------+-----------+----------+--------------+ CFV      Full           Yes      Yes                                 +---------+---------------+---------+-----------+----------+--------------+ SFJ      Full                                                        +---------+---------------+---------+-----------+----------+--------------+ FV Prox  Full                                                        +---------+---------------+---------+-----------+----------+--------------+ FV Mid   Full                                                        +---------+---------------+---------+-----------+----------+--------------+ FV DistalFull                                                        +---------+---------------+---------+-----------+----------+--------------+ PFV      Full                                                        +---------+---------------+---------+-----------+----------+--------------+ POP      Full           Yes      Yes                                 +---------+---------------+---------+-----------+----------+--------------+ PTV      None                                         Acute          +---------+---------------+---------+-----------+----------+--------------+ PERO     Full                                                        +---------+---------------+---------+-----------+----------+--------------+ Gastroc  None                                         Acute          +---------+---------------+---------+-----------+----------+--------------+  Summary: Right: There is no evidence of deep vein thrombosis in the lower extremity. However, portions of this examination were limited- see technologist comments above. Left: Findings consistent with acute deep vein thrombosis involving the left posterior tibial veins, and left gastrocnemius veins. No cystic structure found in the popliteal  fossa. Ultrasound characteristics of enlarged lymph nodes noted in the groin.  *See table(s) above for measurements and observations. Electronically signed by Harold Barban MD on 03/08/2019 at 12:43:36 AM.    Final     Microbiology: Recent Results (from the past 240 hour(s))  Novel Coronavirus, NAA (Hosp order, Send-out to Ref Lab; TAT 18-24 hrs     Status: None   Collection Time: 03/13/19 12:40 PM   Specimen: Nasopharyngeal Swab; Respiratory  Result Value Ref Range Status   SARS-CoV-2, NAA NOT DETECTED NOT DETECTED Final    Comment: (NOTE) This nucleic acid amplification test was developed and its performance characteristics determined by [employer]. Nucleic acid amplification tests include PCR and TMA. This test has not been FDA cleared or approved. This test has been authorized by FDA under an Emergency Use Authorization (EUA). This test is only authorized for the duration of time the declaration that circumstances exist justifying the authorization of the emergency use of in vitro diagnostic tests for detection of SARS-CoV-2 virus and/or diagnosis of COVID-19 infection under section 564(b)(1) of the Act, 21 U.S.C. 536RWE-3(X) (1), unless the authorization is terminated or revoked sooner. When diagnostic testing is negative, the possibility of a false negative result should be considered in the context of a patient's recent exposures and the presence of clinical signs and symptoms consistent with COVID-19. An individual without symptoms of COVID- 19 and who is not shedding SARS-CoV-2 vi rus would expect to have a negative (not detected) result in this assay. Performed At: [facility] [address] [place], [place] [id number] Rush Farmer MD PJ:[id number]    [place]  Final    Comment: Performed at [facility], [place] [address] [place], [place] [address]     Labs: Basic Metabolic Panel: No results for input(s): NA, K, CL,  CO2, GLUCOSE, BUN, CREATININE, CALCIUM, MG, PHOS in the last 168 hours. Liver Function Tests: No results for input(s): AST, ALT, ALKPHOS, BILITOT, PROT, ALBUMIN in the last 168 hours. No results for input(s): LIPASE, AMYLASE in the last 168 hours. No results for input(s): AMMONIA in the last 168 hours. CBC: No results for input(s): WBC, NEUTROABS, HGB, HCT, MCV, PLT in the last 168 hours. Cardiac Enzymes: No results for input(s): CKTOTAL, CKMB, CKMBINDEX, TROPONINI in the last 168 hours. BNP: BNP (last 3 results) No results for input(s): BNP in the last 8760 hours.  ProBNP (last 3 results) No results for input(s): PROBNP in the last 8760 hours.  CBG: No results for input(s): GLUCAP in the last 168 hours.     SignedRadene Gunning NP [facility] 03/18/2019, 10:22 AM

## 2019-03-18 NOTE — Discharge Planning (Signed)
Patient discharged home in stable condition. Verbalizes understanding of all discharge instructions, including home medications and follow up appointments. Left arm IV site red, edematous and tender. Dr. Eliseo Squires notified. Instructed pt and daughter to monitor site for increasing red streaks, edema, etc. Incision care reviewed with pt and daughter.

## 2019-03-18 NOTE — Progress Notes (Addendum)
   ENT Progress Note:  s/p Procedure(s): EXCISION MASS NECK Skin Biopsy Left Thigh   Subjective: Stable, no bleeding  Objective: Vital signs in last 24 hours: Temp:  [97 F (36.1 C)-97.9 F (36.6 C)] 97.7 F (36.5 C) (10/27 0353) Pulse Rate:  [73-98] 83 (10/27 0353) Resp:  [15-22] 18 (10/27 0353) BP: (97-189)/(49-165) 114/76 (10/27 0353) SpO2:  [92 %-97 %] 95 % (10/27 0353) Weight:  [54.6 kg] 54.6 kg (10/26 1400) Weight change: -1.827 kg Last BM Date: 03/15/19  Intake/Output from previous day: 10/26 0701 - 10/27 0700 In: 1636.8 [P.O.:660; I.V.:826.8; IV Piggyback:100] Out: 50 [Blood:50] Intake/Output this shift: No intake/output data recorded.  Labs: No results for input(s): WBC, HGB, HCT, PLT in the last 72 hours. No results for input(s): NA, K, CL, CO2, GLUCOSE, BUN, CALCIUM in the last 72 hours.  Invalid input(s): CREATININR  Studies/Results: No results found.   PHYSICAL EXAM: Incision c/d/i on left neck. No hematoma.  Drain removed.  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   [place] Marcellino 03/18/2019, 7:52 AM

## 2019-03-18 NOTE — Progress Notes (Signed)
Patient refused blood draws.

## 2019-03-19 ENCOUNTER — Inpatient Hospital Stay: Payer: Medicare Other | Admitting: Hematology

## 2019-03-21 ENCOUNTER — Ambulatory Visit: Payer: Medicare Other | Admitting: Hematology

## 2019-03-21 ENCOUNTER — Encounter: Payer: Self-pay | Admitting: Oncology

## 2019-03-21 DIAGNOSIS — Z853 Personal history of malignant neoplasm of breast: Secondary | ICD-10-CM | POA: Diagnosis not present

## 2019-03-21 DIAGNOSIS — Z1231 Encounter for screening mammogram for malignant neoplasm of breast: Secondary | ICD-10-CM | POA: Diagnosis not present

## 2019-03-25 ENCOUNTER — Ambulatory Visit: Payer: Medicare Other | Admitting: Hematology

## 2019-03-27 ENCOUNTER — Inpatient Hospital Stay: Payer: Medicare Other

## 2019-03-27 ENCOUNTER — Telehealth: Payer: Self-pay | Admitting: Hematology

## 2019-03-27 ENCOUNTER — Other Ambulatory Visit: Payer: Self-pay

## 2019-03-27 ENCOUNTER — Inpatient Hospital Stay: Payer: Medicare Other | Attending: Hematology | Admitting: Hematology

## 2019-03-27 VITALS — BP 148/74 | HR 86 | Temp 98.7°F | Resp 18 | Ht 64.0 in | Wt 120.0 lb

## 2019-03-27 DIAGNOSIS — Z853 Personal history of malignant neoplasm of breast: Secondary | ICD-10-CM | POA: Insufficient documentation

## 2019-03-27 DIAGNOSIS — C8598 Non-Hodgkin lymphoma, unspecified, lymph nodes of multiple sites: Secondary | ICD-10-CM

## 2019-03-27 DIAGNOSIS — I2699 Other pulmonary embolism without acute cor pulmonale: Secondary | ICD-10-CM | POA: Diagnosis not present

## 2019-03-27 DIAGNOSIS — R591 Generalized enlarged lymph nodes: Secondary | ICD-10-CM | POA: Insufficient documentation

## 2019-03-27 LAB — CMP (CANCER CENTER ONLY)
ALT: 12 U/L (ref 0–44)
AST: 14 U/L — ABNORMAL LOW (ref 15–41)
Albumin: 3.6 g/dL (ref 3.5–5.0)
Alkaline Phosphatase: 104 U/L (ref 38–126)
Anion gap: 11 (ref 5–15)
BUN: 19 mg/dL (ref 8–23)
CO2: 26 mmol/L (ref 22–32)
Calcium: 10 mg/dL (ref 8.9–10.3)
Chloride: 105 mmol/L (ref 98–111)
Creatinine: 0.75 mg/dL (ref 0.44–1.00)
GFR, Est AFR Am: >60 mL/min (ref >60)
GFR, Estimated: >60 mL/min (ref >60)
Glucose, Bld: 98 mg/dL (ref 70–99)
Potassium: 4.4 mmol/L (ref 3.5–5.1)
Sodium: 142 mmol/L (ref 135–145)
Total Bilirubin: 0.5 mg/dL (ref 0.3–1.2)
Total Protein: 6.3 g/dL — ABNORMAL LOW (ref 6.5–8.1)

## 2019-03-27 LAB — CBC WITH DIFFERENTIAL/PLATELET
Abs Immature Granulocytes: 0.07 10*3/uL (ref 0.00–0.07)
Basophils Absolute: 0.1 10*3/uL (ref 0.0–0.1)
Basophils Relative: 1 %
Eosinophils Absolute: 1.5 10*3/uL — ABNORMAL HIGH (ref 0.0–0.5)
Eosinophils Relative: 18 %
HCT: 42.6 % (ref 36.0–46.0)
Hemoglobin: 13.5 g/dL (ref 12.0–15.0)
Immature Granulocytes: 1 %
Lymphocytes Relative: 10 %
Lymphs Abs: 0.8 10*3/uL (ref 0.7–4.0)
MCH: 28.4 pg (ref 26.0–34.0)
MCHC: 31.7 g/dL (ref 30.0–36.0)
MCV: 89.7 fL (ref 80.0–100.0)
Monocytes Absolute: 0.7 10*3/uL (ref 0.1–1.0)
Monocytes Relative: 8 %
Neutro Abs: 5.3 10*3/uL (ref 1.7–7.7)
Neutrophils Relative %: 62 %
Platelets: 211 10*3/uL (ref 150–400)
RBC: 4.75 MIL/uL (ref 3.87–5.11)
RDW: 15.4 % (ref 11.5–15.5)
WBC: 8.5 10*3/uL (ref 4.0–10.5)
nRBC: 0 % (ref 0.0–0.2)

## 2019-03-27 LAB — HEPATITIS C ANTIBODY: HCV Ab: NONREACTIVE

## 2019-03-27 LAB — HEPATITIS B CORE ANTIBODY, TOTAL: Hep B Core Total Ab: NONREACTIVE

## 2019-03-27 LAB — LACTATE DEHYDROGENASE: LDH: 252 U/L — ABNORMAL HIGH (ref 98–192)

## 2019-03-27 LAB — HEPATITIS B SURFACE ANTIGEN: Hepatitis B Surface Ag: NONREACTIVE

## 2019-03-27 NOTE — Patient Instructions (Signed)
Thank you for choosing [facility] to provide your oncology and hematology care.   Should you have questions after your visit to the [facility] ([facility]), please contact this office at [phone] between 8:30 AM and 4:30 PM.  Voice mails left after 4:00 PM may not be returned until the following business day.  Calls received after 4:30 PM will be answered by an off-site Nurse Triage Line.    Prescription Refills:  Please have your pharmacy contact us directly for most prescription requests.  Contact the office directly for refills of narcotics (pain medications). Allow 48-72 hours for refills.  Appointments: Please contact the [facility] scheduling department [phone] for questions regarding [facility] appointment scheduling.  Contact the schedulers with any scheduling changes so that your appointment can be rescheduled in a timely manner.   Central Scheduling for  [phone] - Call to schedule procedures such as PET scans, CT scans, MRI, Ultrasound, etc.  To afford each patient quality time with our providers, please arrive 30 minutes before your scheduled appointment time.  If you arrive late for your appointment, you may be asked to reschedule.  We strive to give you quality time with our providers, and arriving late affects you and other patients whose appointments are after yours. If you are a no show for multiple scheduled visits, you may be dismissed from the clinic at the providers discretion.     Resources: [facility] Social Workers [phone] for additional information on assistance programs --Anne Cunningham/Abigail Elmore  [place] DSS  [phone]: Information regarding food stamps, Medicaid, and utility assistance [employer] [phone]   [place] [employer]'s shared-ride transportation service for eligible riders who have a disability that prevents them from riding the fixed route bus.   Medicare Rights Center 800-333-4114 Helps people with  Medicare understand their rights and benefits, navigate the Medicare system, and secure the quality healthcare they deserve [employer] 800-227-2345 Assists patients locate various types of support and financial assistance Cancer Care: 1-800-813-HOPE (4673) Provides financial assistance, online support groups, medication/co-pay assistance.   Transportation Assistance for appointments at [facility]: Transportation Coordinator [phone]  Again, thank you for choosing  Can[facility] Center for your care.       

## 2019-03-27 NOTE — Progress Notes (Signed)
HEMATOLOGY/ONCOLOGY CONSULTATION NOTE  Date of Service: 03/27/2019  Patient Care Team: Lajean Manes, MD as PCP - General (Internal Medicine) Buford Dresser, MD as PCP - Cardiology (Cardiology)  REFERRING PHYSICIAN: Lajean Manes, MD  CHIEF COMPLAINTS/PURPOSE OF CONSULTATION:  Possible lymphoma     HISTORY OF PRESENTING ILLNESS:   Sarah [name] is a wonderful 85 y.o. female who has been referred to [place] by Lajean Manes, MD for evaluation and management of Possible lymphoma . She is accompanied today by her daughter Sarah [name] . The pt reports that she is doing well overall.   Pending Surgical pathology from 03/17/2019 Dr.Manny will contact clinic about pathology   August 25th/27th she began coughing and sneezing. On Sept 3 went to doctors and they said it was allergies. She began having rashes (little red dots). Went to PCP and they prescribed prednisone and for several days and the medication helped. But as soon as she completed the prescription symptoms reoccurred.   She had a chest X Ray done because she was having a severe cough on 02/17/2019 revealing "bilateral effusions in this patient with history of breast cancer,of uncertain significance with question of right lower lobe pulmonary nodule, consider chest CT for further assessment. Atherosclerotic changes in the thoracic aorta."  Dr. Amy Martinique biopsied the rashes on her back 02/21/2019 and it was determined that it could be because of an allergy to medication. Dr.Stoneking stopped her blood pressure medication from September- October but ther rash still present. The itching started 1 to 2 months ago. The rash would come and go and was mostly on her back. She still has rash on her left thigh.  She is using triaconazole and cetrizine.  She has lumps behind ear and abdomin and she states that they have swollen twice the size within 1 and half months  She has no appetite and has changes in her taste that began around  August. She has changes in bowels. They are more loose and smaller In size. She also has no control over bowels.  She feels fatigued and it has increased since August.   She has a cough that starts in September  that has made her horse.   She had a CT scan on 02/27/2019 because she was having severe cough and her stomach was very extended. Revealing "1. Extensive lymphadenopathy throughout the chest, abdomen, and pelvis, with index lymph nodes identified above. Splenomegaly. Findings are most consistent with lymphoma with recurrent, metastatic breast malignancy less favored. 2. There is generally osteopenic appearance of the skeleton. There are numerous subtle hypodense lesions, particularly of the pelvis (e.g. Series 2, image 88) and select vertebral bodies, for example L4 (series 8, image 92). This is suspicious although not definitive for osseous metastatic disease. Characterization for metabolic activity by nuclear scintigraphic bone scan or PET-CT would be helpful to further evaluate. 3. Moderate right, small left pleural effusions with associated atelectasis or consolidation. Subpleural radiation fibrosis of the anterior right lung. 4. There is a new subpleural pulmonary nodule of the anterior right middle lobe measuring 6 mm (series 4, image 105), nonspecific. There is no obvious pleural thickening or nodularity definitive for metastatic disease. 5.  Small volume ascites. 6. There is a fluid attenuation lesion of the pancreatic uncinate measuring 2.4 x 1.3 cm (series 2, image 63), not substantially changed when compared to remote prior MR examination dated 02/23/2012 and likely sequelae of a prior pseudocyst versus incidental pancreatic IPMN. 7.  Cardiomegaly."  Hospitalized from 03/06/2019-03/08/2019. She presenting  with acute shortness of breath, leg swelling,left hand swelling and some weight gain despite poor appetite. In ED, CTA chest revealed at least submassive PE, moderate right pleural  effusion, diffuse LAD concerning for lymphoma. Started on heparin drip. PCCM consulted for guidance. She had thoracocentesis with removal of 1200 cc cloudy exudative pleural fluid. Cardiology consulted for elevated which was thought to be demand ischemia from right heart strain in the setting of PE. Patient was transitioned to subcu Lovenox by pulmonology the next day and remained stable. Unusual appearance of tracheal air column at the thoracic inlet. Correlate with any history of swallowing difficulty or changes in voice with dedicated imaging with chest CT and or CT of the neck as indicated.  She has a nonhealing ulcer on her right tibialis anterior that starting weeping one week ago. Of note since the patient's last visit, pt has had CHEST XRAY - 2 VIEW (Accession [id number]) completed on 02/17/2019 with results revealing "Bilateral effusions in this patient with history of breast cancer,of uncertain significance with question of right lower lobepulmonary nodule, consider chest CT for furtherassessment.Unusual appearance of tracheal air column at the thoracic inlet.Correlate with any history of swallowing difficulty or changes in voice with dedicated imaging with chest CT and or CT of the neck as Indicated. Atherosclerotic changes in the thoracic aorta."  Of note since the patient's last visit, pt has had CT CHEST, ABDOMEN, AND PELVIS WITH CONTRAST (Accession [id number]) completed on 02/27/2019 with results revealing "1. Extensive lymphadenopathy throughout the chest, abdomen, and pelvis, with index lymph nodes identified above. Splenomegaly.Findings are most consistent with lymphoma with recurrent, metastatic breast malignancy less favored. 2. There is generally osteopenic appearance of the skeleton. There are numerous subtle hypodense lesions, particularly of the pelvis (e.g. Series 2, image 88) and select vertebral bodies, for example L4 (series 8, image 92). This is suspicious although not  definitive for osseous metastatic disease. Characterization for metabolic activity by nuclear scintigraphic bone scan or PET-CT would be helpful to further evaluate.  3. Moderate right, small left pleural effusions with associated atelectasis or consolidation. Subpleural radiation fibrosis of the anterior right lung.4. There is a new subpleural pulmonary nodule of the anterior right middle lobe measuring 6 mm (series 4, image 105), nonspecific. There is no obvious pleural thickening or nodularity definitive for metastatic disease.5.  Small volume ascites. 6. There is a fluid attenuation lesion of the pancreatic uncinate measuring 2.4 x 1.3 cm (series 2, image 63), not substantially changed when compared to remote prior MR examination dated 02/23/2012 and likely sequelae of a prior pseudocyst versus incidental pancreatic IPMN.7.  Cardiomegaly.8. Other chronic and incidental findings as detailed above. Aortic Atherosclerosis (ICD10-I70.0)."  Of note since the patient's last visit, pt has had PORTABLE CHEST 1 VIEW (Accession [id number]) completed on 03/06/2019 with results revealing "1. Moderate size right and small left pleural effusions appear not significantly changed since 02/27/19. 2. No new cardiopulmonary abnormality."  Of note since the patient's last visit, pt has had ECHOCARDIOGRAM  completed on 03/06/2019 with results revealing  "1. Left ventricular ejection fraction, by visual estimation, is 60 to 65%. The left ventricle has normal function. Normal left ventricular size. There is no left ventricular hypertrophy. 2. Left ventricular diastolic Doppler parameters are consistent with impaired relaxation pattern of LV diastolic filling. 3. Global right ventricle has mildly reduced systolic function.The right ventricular size is moderately enlarged. No increase in right ventricular wall thickness. 4. Left atrial size was normal. 5. Right atrial size was mildly dilated.  6. Trivial pericardial effusion  is present. 7. The mitral valve is normal in structure. Trace mitral valve regurgitation. No evidence of mitral stenosis. 8. The tricuspid valve is normal in structure. Tricuspid valve regurgitation severe. 9. The aortic valve is normal in structure. Aortic valve regurgitation was not visualized by color flow Doppler. Structurally normal aortic valve, with no evidence of sclerosis or stenosis.10. The pulmonic valve was normal in structure. Pulmonic valve regurgitation is mild by color flow Doppler.11. Mildly elevated pulmonary artery systolic pressure.12. The inferior vena cava is normal in size with greater than 50% respiratory variability, suggesting right atrial pressure of 3 mmHg."  Of note since the patient's last visit, pt has had CT ANGIOGRAPHY CHEST WITH CONTRAST (Accession [id number]) completed on 03/06/2019 with results revealing "1. Pulmonary embolus arising from the distal left main pulmonary artery with extension into multiple left lower lobe pulmonary arterial branches. There is also incompletely obstructing pulmonary embolus in the proximal left upper lobe pulmonary artery. More distal pulmonary embolus in the right lower lobe pulmonary artery. Positive for acute PE with CT evidence of right heart strain (RV/LV Ratio = 1.6) consistent with at least submassive (intermediate risk) PE. The presence of right heart strain has been associated with an increased risk of morbidity and mortality. Please activate Code PE by paging [phone]. 2. Sizable pleural effusion on the right with smaller pleural effusion on the left. Consolidation and compressive atelectasis throughout most of the right lower lobe. Milder atelectasis left base. Nodular opacities on the right, likely metastatic foci.3.  Stable adenopathy compared to 1 week prior.4. Enlargement of spleen, incompletely visualized but documented CT 1 week prior. Concern for potential lymphoma. 5. Aortic atherosclerosis. No aneurysm or dissection  evident. Foci of coronary artery calcification noted."  Most recent lab results (03/06/2019) of CBC is as follows: all values are WNL except for Platelets at 72, Eosinophils Absolute at 0.6,Abs Immature Granulocytes at 0.08 .  On review of systems, pt reports rashes, digestive issues and denies back pain, abdominal pain and any other symptoms.   On PMHx the pt reports Hypothyroidism, MCI, Hypercholesterolemia, Tracheal anomaly, Hypertension, Pulmonary nodule, Atherosclerosis of aorta, anxiety, gait disorder, primary insomnia.    MEDICAL HISTORY:  Past Medical History:  Diagnosis Date   Allergy    codeine, thiazides   Anxiety    new dx   Arthritis    Breast cancer ([facility]) 03/14/12   bx=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,ER/PR=+,upper inner quad   Cancer ([facility])    breast   Cataract    DVT (deep venous thrombosis) ([place]) 02/2019   left leg   Dyspnea    Edema    both legs feet and toe, abdomen   Glaucoma    laser treated years ago   HOH (hard of hearing)    Hyperlipemia    Hypertension    Hypothyroidism    Pancreatic cyst    benign   PONV (postoperative nausea and vomiting)    Pulmonary embolism ([place] [facility]) 02/2019   bilateral    Radiation 06/11/2012-07/12/2012   17 sessions 4250 cGy, 3 sessions 750 cGy   Vertigo    Wears glasses    Wears partial dentures    partial upper     SURGICAL HISTORY: Past Surgical History:  Procedure Laterality Date   ABDOMINAL HYSTERECTOMY  1966   1/2 ovary left in    [place]   lumpectomy-lt   CATARACT EXTRACTION     b/l   COLONOSCOPY  EXCISION MASS NECK Left 03/17/2019    EXCISION MASS NECK (Left Neck)   EXCISION MASS NECK Left 03/17/2019   Procedure: EXCISION MASS NECK;  Surgeon: Helayne Seminole, MD;  Location: [facility];  Service: ENT;  Laterality: Left;   EYE SURGERY Bilateral    bilateral cataract removal   JOINT REPLACEMENT  2013   rt total knee   JOINT REPLACEMENT   1995   lt total knee   PARTIAL MASTECTOMY WITH NEEDLE LOCALIZATION  04/09/2012   Procedure: PARTIAL MASTECTOMY WITH NEEDLE LOCALIZATION;  Surgeon: Adin Hector, MD;  Location: [facility];  Service: General;  Laterality: Right;   SKIN BIOPSY Left 03/17/2019   LEFT THIGH   SKIN BIOPSY Left 03/17/2019   Procedure: Skin Biopsy Left Thigh;  Surgeon: Helayne Seminole, MD;  Location: [facility];  Service: ENT;  Laterality: Left;   TONSILLECTOMY     TOTAL KNEE ARTHROPLASTY  08/16/2011   Procedure: TOTAL KNEE ARTHROPLASTY;  Surgeon: Ninetta Lights, MD;  Location: [facility];  Service: Orthopedics;  Laterality: Right;     SOCIAL HISTORY: Social History   Socioeconomic History   Marital status: Widowed    Spouse name: Not on file   Number of children: 2   Years of education: Not on file   Highest education level: Not on file  Occupational History   Occupation: Retired    Comment: Interior and spatial designer strain: Not on file   Food insecurity    Worry: Not on file    Inability: Not on Lexicographer needs    Medical: Not on file    Non-medical: Not on file  Tobacco Use   Smoking status: Never Smoker   Smokeless tobacco: Never Used  Substance and Sexual Activity   Alcohol use: No   Drug use: No   Sexual activity: Not Currently  Lifestyle   Physical activity    Days per week: Not on file    Minutes per session: Not on file   Stress: Not on file  Relationships   Social connections    Talks on phone: Not on file    Gets together: Not on file    Attends religious service: Not on file    Active member of club or organization: Not on file    Attends meetings of clubs or organizations: Not on file    Relationship status: Not on file   Intimate partner violence    Fear of current or ex partner: Not on file    Emotionally abused: Not on file    Physically abused: Not on file    Forced sexual activity: Not on file   Other Topics Concern   Not on file  Social History Narrative   Not on file     FAMILY HISTORY: Family History  Problem Relation Age of Onset   Heart disease Father    Cancer Maternal Aunt        stomach     ALLERGIES:   is allergic to codeine; latex; and thiazide-type diuretics.   MEDICATIONS:  Current Outpatient Medications  Medication Sig Dispense Refill   amLODipine (NORVASC) 2.5 MG tablet Take 2.5-5 mg by mouth See admin instructions. 5 mg in the morning, 2.5 mg in the evening     apixaban (ELIQUIS) 5 MG TABS tablet Take 2 tablets (10mg ) twice daily for 7 days, then 1 tablet (5mg ) twice daily 60 tablet 0   [START ON  04/07/2019] apixaban (ELIQUIS) 5 MG TABS tablet Take 1 tablet (5 mg total) by mouth 2 (two) times daily. 60 tablet 11   bacitracin ointment Apply 1 application topically every 8 (eight) hours. 120 g 0   benzonatate (TESSALON) 200 MG capsule Take 1 capsule (200 mg total) by mouth 3 (three) times daily. 20 capsule 0   Calcium Carbonate (CALCIUM 600 PO) Take 600 mg by mouth daily.     cetirizine (ZYRTEC) 10 MG tablet Take 10 mg by mouth daily.      Cholecalciferol (VITAMIN D) 50 MCG (2000 UT) tablet Take 2,000 Units by mouth 2 (two) times daily.     Cyanocobalamin (VITAMIN B-12 PO) Take 3,000 mcg by mouth daily.      fluticasone (FLONASE) 50 MCG/ACT nasal spray Place 1 spray into both nostrils daily. 16 g 1   fluticasone furoate-vilanterol (BREO ELLIPTA) 100-25 MCG/INH AEPB Inhale 1 puff into the lungs daily. 60 each 0   hydrOXYzine (ATARAX/VISTARIL) 25 MG tablet Take 1 tablet (25 mg total) by mouth at bedtime as needed for itching. 30 tablet 0   Krill Oil (OMEGA-3) 500 MG CAPS Take 500 mg by mouth daily.     levothyroxine (SYNTHROID) 25 MCG tablet Take 25 mcg by mouth daily before breakfast.      Multiple Vitamins-Minerals (PRESERVISION AREDS PO) Take 1 capsule by mouth 2 (two) times daily.      olopatadine (PATANOL) 0.1 % ophthalmic solution  Place 1 drop into both eyes 2 (two) times daily.     pantoprazole (PROTONIX) 40 MG tablet Take 1 tablet (40 mg total) by mouth daily. 90 tablet 1   triamcinolone cream (KENALOG) 0.1 % Apply 1 application topically 3 (three) times daily.      vitamin E 400 UNIT capsule Take 400 Units by mouth daily.     zolpidem (AMBIEN) 10 MG tablet Take 2.5 mg by mouth at bedtime.      No current facility-administered medications for this visit.      REVIEW OF SYSTEMS:   A 10+ POINT REVIEW OF SYSTEMS WAS OBTAINED including neurology, dermatology, psychiatry, cardiac, respiratory, lymph, extremities, GI, GU, Musculoskeletal, constitutional, breasts, reproductive, HEENT.  All pertinent positives are noted in the HPI.  All others are negative.    PHYSICAL EXAMINATION: ECOG PERFORMANCE STATUS: 2 - Symptomatic, <50% confined to bed  Vitals:   03/27/19 1141  BP: (!) 148/74  Pulse: 86  Resp: 18  Temp: 98.7 F (37.1 C)  SpO2: 96%   Filed Weights   03/27/19 1141  Weight: 120 lb (54.4 kg)   Body mass index is 20.6 kg/m.  GENERAL:alert, in no acute distress and comfortable SKIN: no acute rashes, no significant lesions EYES: conjunctiva are pink and non-injected, sclera anicteric OROPHARYNX: MMM, no exudates, no oropharyngeal erythema or ulceration NECK: supple, no JVD, Lymph nodes on both side of neck and above the collarbone  LYMPH:  no palpable lymphadenopathy in the cervical, axillary or inguinal regions LUNGS: clear to auscultation b/l with normal respiratory effort HEART: regular rate & rhythm ABDOMEN:  normoactive bowel sounds , non tender, not distended.  Spleen 2 finger breadths  below costal margin Extremity:+2 pedel edema, stiffness in caf PSYCH: alert & oriented x 3 with fluent speech NEURO: no focal motor/sensory deficits   LABORATORY DATA:  I have reviewed the data as listed  CBC Latest Ref Rng & Units 03/27/2019 03/07/2019 03/06/2019  WBC 4.0 - 10.5 K/uL 8.5 8.4 8.3    Hemoglobin 12.0 - 15.0 g/dL  13.5 13.1 14.0  Hematocrit 36.0 - 46.0 % 42.6 39.5 45.3  Platelets 150 - 400 K/uL 211 214 72(L)    CMP Latest Ref Rng & Units 03/27/2019 03/08/2019 03/07/2019  Glucose 70 - 99 mg/dL 98 134(H) 142(H)  BUN 8 - 23 mg/dL 19 13 17   Creatinine 0.44 - 1.00 mg/dL 0.75 0.68 0.76  Sodium 135 - 145 mmol/L 142 139 140  Potassium 3.5 - 5.1 mmol/L 4.4 4.1 3.1(L)  Chloride 98 - 111 mmol/L 105 109 106  CO2 22 - 32 mmol/L 26 21(L) 23  Calcium 8.9 - 10.3 mg/dL 10.0 8.6(L) 8.6(L)  Total Protein 6.5 - 8.1 g/dL 6.3(L) - -  Total Bilirubin 0.3 - 1.2 mg/dL 0.5 - -  Alkaline Phos 38 - 126 U/L 104 - -  AST 15 - 41 U/L 14(L) - -  ALT 0 - 44 U/L 12 - -  03/06/2019 CT ANGIOGRAPHY CHEST WITH CONTRAST (Accession [id number])   03/06/2019 ECHOCARDIOGRAM     03/06/2019 PORTABLE CHEST 1 VIEW (Accession [id number])    02/27/2019 CT CHEST, ABDOMEN, AND PELVIS WITH CONTRAST (Accession [id number])    02/17/2019 CHEST XRAY - 2 VIEW (Accession [id number])    RADIOGRAPHIC STUDIES: I have personally reviewed the radiological images as listed and agreed with the findings in the report. Dg Chest 2 View  Result Date: 03/13/2019 CLINICAL DATA:  Follow-up pleural effusion EXAM: CHEST - 2 VIEW COMPARISON:  03/06/2019 FINDINGS: Cardiac shadow is stable. Aortic calcifications are again seen. Small bilateral pleural effusions are again noted and stable. Right basilar consolidation is noted increased from the prior exam. No other focal infiltrate is seen. IMPRESSION: Increasing right basilar consolidation. Stable pleural effusions bilaterally. Electronically Signed   By: Inez Catalina M.D.   On: 03/13/2019 11:07   Ct Soft Tissue Neck W Contrast  Result Date: 02/27/2019 CLINICAL DATA:  Abnormal appearance of the trachea on chest radiographs. Abdominal bloating and cough for 6 weeks. History of breast cancer. EXAM: CT NECK WITH CONTRAST TECHNIQUE: Multidetector CT imaging of the neck was performed  using the standard protocol following the bolus administration of intravenous contrast. CONTRAST:  135mL ISOVUE-300 IOPAMIDOL (ISOVUE-300) INJECTION 61% COMPARISON:  None. FINDINGS: Pharynx and larynx: There is an 11 x 8 x 16 mm exophytic mucosal mass in the superior oropharynx on the left (series 11, image 39). There is also an 8 x 4 mm enhancing mucosal/submucosal lesion more inferiorly in the oropharynx involving the anterior aspect of the left palatine tonsil (series 11, image 49). There is moderate distension of the oropharyngeal airway and hypopharynx. The trachea is widely patent without a focal abnormality identified to account for the appearance on chest radiographs which was likely projectional. Salivary glands: Multiple small enhancing parotid masses measuring up to 10 mm in size bilaterally, likely enlarged intraparotid lymph nodes. Unremarkable submandibular glands. Thyroid: Diffuse thyroid heterogeneity with mild asymmetric enlargement of the left lobe. Lymph nodes: There are numerous mildly enlarged lymph nodes throughout all stations of the neck bilaterally with the largest measuring 10 mm in short axis in level II. Vascular: Major vascular structures of the neck are patent. Limited intracranial: Unremarkable. Visualized orbits: Bilateral cataract extraction. Mastoids and visualized paranasal sinuses: Paranasal sinuses and mastoid air cells are clear. Skeleton: No suspicious osseous lesion. Subcentimeter sclerotic focus in the T4 vertebral body is unchanged from a 09/06/2011 chest CT and likely benign. Bilateral facet ankylosis at C2-3 and C3-4. Severe facet arthrosis at C4-5 with grade 1 anterolisthesis. Advanced disc degeneration in the lower  cervical spine. Upper chest: Reported separately. Other: None. IMPRESSION: 1. Bilateral cervical lymphadenopathy most concerning for lymphoma given findings on separate chest, abdomen, and pelvis CT today. 2. 16 mm left superior oropharyngeal mass and 8 mm  mucosal/submucosal lesion more inferiorly involving the anterior left tonsil. Recommend ENT referral and direct visualization. Electronically Signed   By: Logan Bores M.D.   On: 02/27/2019 17:13   Ct Chest W Contrast  Result Date: 02/27/2019 CLINICAL DATA:  Abnormal chest radiographs, effusions, abnormal appearance of the trachea EXAM: CT CHEST, ABDOMEN, AND PELVIS WITH CONTRAST TECHNIQUE: Multidetector CT imaging of the chest, abdomen and pelvis was performed following the standard protocol during bolus administration of intravenous contrast. CONTRAST:  135mL ISOVUE-300 IOPAMIDOL (ISOVUE-300) INJECTION 61%, additional oral enteric contrast COMPARISON:  Chest radiograph, 02/17/2019, CT chest, 09/06/2011, MR abdomen, 02/23/2012 FINDINGS: CT CHEST FINDINGS Cardiovascular: Aortic atherosclerosis. Cardiomegaly. No pericardial effusion. Mediastinum/Nodes: Numerous bilateral axillary and subpectoral lymph nodes, largest left axillary nodes measuring 1.8 x 1.0 cm (series 2, image 10, 16). Numerous enlarged pretracheal lymph nodes, largest node measuring 1.4 x 1.1 cm (series 2, image 17) enlarged, heterogeneous thyroid. Trachea, and esophagus demonstrate no significant findings. Lungs/Pleura: Moderate right, small left pleural effusions with associated atelectasis or consolidation. Subpleural radiation fibrosis of the anterior right lung. There is a new subpleural pulmonary nodule of the anterior right middle lobe measuring 6 mm (series 4, image 105). Musculoskeletal: Postoperative findings of the right breast. CT ABDOMEN PELVIS FINDINGS Hepatobiliary: No solid liver abnormality is seen. No gallstones, gallbladder wall thickening, or biliary dilatation. Pancreas: There is a fluid attenuation lesion of the pancreatic uncinate measuring 2.4 x 1.3 cm (series 2, image 63). No pancreatic ductal dilatation or surrounding inflammatory changes. Spleen: Splenomegaly, maximum span 15 cm. Adrenals/Urinary Tract: Adrenal glands  are unremarkable. Large exophytic cyst of the midportion of the left kidney. Bladder is unremarkable. Stomach/Bowel: Stomach is within normal limits. Appendix not clearly visualized. No evidence of bowel wall thickening, distention, or inflammatory changes. Severe sigmoid diverticulosis. Vascular/Lymphatic: Aortic atherosclerosis. Numerous enlarged mesenteric, periportal, retroperitoneal, iliac, pelvic sidewall, and inguinal lymph nodes, largest nodes in the left retroperitoneum fairly bulky measuring 2.2 x 1.4 cm (series 2, image 67). Reproductive: Status post hysterectomy. Other: No abdominal wall hernia or abnormality. Small volume ascites. Musculoskeletal: There is generally osteopenic appearance of the skeleton. There are numerous subtle hypodense lesions, particularly of the pelvis (e.g. Series 2, image 88) and select vertebral bodies, for example L4 (series 8, image 92). IMPRESSION: 1. Extensive lymphadenopathy throughout the chest, abdomen, and pelvis, with index lymph nodes identified above. Splenomegaly. Findings are most consistent with lymphoma with recurrent, metastatic breast malignancy less favored. 2. There is generally osteopenic appearance of the skeleton. There are numerous subtle hypodense lesions, particularly of the pelvis (e.g. Series 2, image 88) and select vertebral bodies, for example L4 (series 8, image 92). This is suspicious although not definitive for osseous metastatic disease. Characterization for metabolic activity by nuclear scintigraphic bone scan or PET-CT would be helpful to further evaluate. 3. Moderate right, small left pleural effusions with associated atelectasis or consolidation. Subpleural radiation fibrosis of the anterior right lung. 4. There is a new subpleural pulmonary nodule of the anterior right middle lobe measuring 6 mm (series 4, image 105), nonspecific. There is no obvious pleural thickening or nodularity definitive for metastatic disease. 5.  Small volume  ascites. 6. There is a fluid attenuation lesion of the pancreatic uncinate measuring 2.4 x 1.3 cm (series 2, image 63), not substantially changed  when compared to remote prior MR examination dated 02/23/2012 and likely sequelae of a prior pseudocyst versus incidental pancreatic IPMN. 7.  Cardiomegaly. 8. Other chronic and incidental findings as detailed above. Aortic Atherosclerosis (ICD10-I70.0). Electronically Signed   By: Eddie Candle M.D.   On: 02/27/2019 16:52   Ct Angio Chest Pe W And/or Wo Contrast  Addendum Date: 03/06/2019   ADDENDUM REPORT: 03/06/2019 09:17 ADDENDUM: Comment: No dominant thyroid lesions. Electronically Signed   By: Lowella Grip III M.D.   On: 03/06/2019 09:17   Result Date: 03/06/2019 CLINICAL DATA:  Shortness of breath.  History of breast carcinoma EXAM: CT ANGIOGRAPHY CHEST WITH CONTRAST TECHNIQUE: Multidetector CT imaging of the chest was performed using the standard protocol during bolus administration of intravenous contrast. Multiplanar CT image reconstructions and MIPs were obtained to evaluate the vascular anatomy. CONTRAST:  111mL OMNIPAQUE IOHEXOL 350 MG/ML SOLN COMPARISON:  February 27, 2019 CT chest, abdomen, and pelvis; chest CT September 06, 2011 FINDINGS: Cardiovascular: There are pulmonary emboli arising from the distal most aspect of the left main pulmonary artery with extension primarily into left lower lobe pulmonary arterial branches. There is incompletely obstructing pulmonary embolus as well in the left upper lobe pulmonary artery proximally. There is more peripheral pulmonary embolus in the right lower lobe region. The right ventricle to left ventricle diameter ratio is 1.6, indicative of right heart strain. There is no appreciable thoracic aortic aneurysm or dissection. The visualized great vessels show patchy areas of atherosclerotic calcification. There is extensive aortic atherosclerosis as well as scattered foci of coronary artery calcification. There is  no pericardial effusion or pericardial thickening. Mediastinum/Nodes: There are multiple lymph nodes throughout the axillary and subpectoral regions. Most of these lymph nodes are subcentimeter. Several lymph nodes are borderline prominent, unchanged from 1 week prior. There are several prominent lymph nodes surrounding the mid to distal trachea, largest measuring 1.2 x 1.1 cm. There is no new adenopathy compared to 1 week prior. No esophageal lesions are evident. Lungs/Pleura: There is a large pleural effusion on the right with a much smaller pleural effusion on the left. There is consolidation, largely due to compressive atelectasis throughout the right lower lobe. There is milder compressive atelectasis in the left base. There is a nodular opacity abutting the pleura in the anterior segment of the right upper lobe measuring 1.1 x 0.6 cm. A nodular opacity abutting the pleura in the right middle lobe measures 7 mm, better seen on recent prior study. Upper Abdomen: In the visualized upper abdomen, there is aortic atherosclerosis. Spleen is incompletely visualized but appears enlarged. Spleen is noted to be enlarged on recent CT abdomen study. There appears to be adenopathy in the upper retroperitoneum, incompletely visualized. Musculoskeletal: There is degenerative change in the thoracic spine. No blastic or lytic bone lesions are evident. A bone island is noted in the T4 vertebral body, stable. Postoperative change in the right breast region noted. Review of the MIP images confirms the above findings. IMPRESSION: 1. Pulmonary embolus arising from the distal left main pulmonary artery with extension into multiple left lower lobe pulmonary arterial branches. There is also incompletely obstructing pulmonary embolus in the proximal left upper lobe pulmonary artery. More distal pulmonary embolus in the right lower lobe pulmonary artery. Positive for acute PE with CT evidence of right heart strain (RV/LV Ratio = 1.6)  consistent with at least submassive (intermediate risk) PE. The presence of right heart strain has been associated with an increased risk of morbidity and mortality.  Please activate Code PE by paging [phone]. 2. Sizable pleural effusion on the right with smaller pleural effusion on the left. Consolidation and compressive atelectasis throughout most of the right lower lobe. Milder atelectasis left base. Nodular opacities on the right, likely metastatic foci. 3.  Stable adenopathy compared to 1 week prior. 4. Enlargement of spleen, incompletely visualized but documented CT 1 week prior. Concern for potential lymphoma. 5. Aortic atherosclerosis. No aneurysm or dissection evident. Foci of coronary artery calcification noted. Aortic Atherosclerosis (ICD10-I70.0). Critical Value/emergent results were called by telephone at the time of interpretation on 03/06/2019 at 9:12 am to provider Dr. Ronnald Nian, ED physician, who verbally acknowledged these results. Electronically Signed: By: Lowella Grip III M.D. On: 03/06/2019 09:13   Ct Abdomen Pelvis W Contrast  Result Date: 02/27/2019 CLINICAL DATA:  Abnormal chest radiographs, effusions, abnormal appearance of the trachea EXAM: CT CHEST, ABDOMEN, AND PELVIS WITH CONTRAST TECHNIQUE: Multidetector CT imaging of the chest, abdomen and pelvis was performed following the standard protocol during bolus administration of intravenous contrast. CONTRAST:  162mL ISOVUE-300 IOPAMIDOL (ISOVUE-300) INJECTION 61%, additional oral enteric contrast COMPARISON:  Chest radiograph, 02/17/2019, CT chest, 09/06/2011, MR abdomen, 02/23/2012 FINDINGS: CT CHEST FINDINGS Cardiovascular: Aortic atherosclerosis. Cardiomegaly. No pericardial effusion. Mediastinum/Nodes: Numerous bilateral axillary and subpectoral lymph nodes, largest left axillary nodes measuring 1.8 x 1.0 cm (series 2, image 10, 16). Numerous enlarged pretracheal lymph nodes, largest node measuring 1.4 x 1.1 cm (series 2,  image 17) enlarged, heterogeneous thyroid. Trachea, and esophagus demonstrate no significant findings. Lungs/Pleura: Moderate right, small left pleural effusions with associated atelectasis or consolidation. Subpleural radiation fibrosis of the anterior right lung. There is a new subpleural pulmonary nodule of the anterior right middle lobe measuring 6 mm (series 4, image 105). Musculoskeletal: Postoperative findings of the right breast. CT ABDOMEN PELVIS FINDINGS Hepatobiliary: No solid liver abnormality is seen. No gallstones, gallbladder wall thickening, or biliary dilatation. Pancreas: There is a fluid attenuation lesion of the pancreatic uncinate measuring 2.4 x 1.3 cm (series 2, image 63). No pancreatic ductal dilatation or surrounding inflammatory changes. Spleen: Splenomegaly, maximum span 15 cm. Adrenals/Urinary Tract: Adrenal glands are unremarkable. Large exophytic cyst of the midportion of the left kidney. Bladder is unremarkable. Stomach/Bowel: Stomach is within normal limits. Appendix not clearly visualized. No evidence of bowel wall thickening, distention, or inflammatory changes. Severe sigmoid diverticulosis. Vascular/Lymphatic: Aortic atherosclerosis. Numerous enlarged mesenteric, periportal, retroperitoneal, iliac, pelvic sidewall, and inguinal lymph nodes, largest nodes in the left retroperitoneum fairly bulky measuring 2.2 x 1.4 cm (series 2, image 67). Reproductive: Status post hysterectomy. Other: No abdominal wall hernia or abnormality. Small volume ascites. Musculoskeletal: There is generally osteopenic appearance of the skeleton. There are numerous subtle hypodense lesions, particularly of the pelvis (e.g. Series 2, image 88) and select vertebral bodies, for example L4 (series 8, image 92). IMPRESSION: 1. Extensive lymphadenopathy throughout the chest, abdomen, and pelvis, with index lymph nodes identified above. Splenomegaly. Findings are most consistent with lymphoma with recurrent,  metastatic breast malignancy less favored. 2. There is generally osteopenic appearance of the skeleton. There are numerous subtle hypodense lesions, particularly of the pelvis (e.g. Series 2, image 88) and select vertebral bodies, for example L4 (series 8, image 92). This is suspicious although not definitive for osseous metastatic disease. Characterization for metabolic activity by nuclear scintigraphic bone scan or PET-CT would be helpful to further evaluate. 3. Moderate right, small left pleural effusions with associated atelectasis or consolidation. Subpleural radiation fibrosis of the anterior right lung. 4. There is  a new subpleural pulmonary nodule of the anterior right middle lobe measuring 6 mm (series 4, image 105), nonspecific. There is no obvious pleural thickening or nodularity definitive for metastatic disease. 5.  Small volume ascites. 6. There is a fluid attenuation lesion of the pancreatic uncinate measuring 2.4 x 1.3 cm (series 2, image 63), not substantially changed when compared to remote prior MR examination dated 02/23/2012 and likely sequelae of a prior pseudocyst versus incidental pancreatic IPMN. 7.  Cardiomegaly. 8. Other chronic and incidental findings as detailed above. Aortic Atherosclerosis (ICD10-I70.0). Electronically Signed   By: Eddie Candle M.D.   On: 02/27/2019 16:52   Dg Chest Port 1 View  Result Date: 03/06/2019 CLINICAL DATA:  Shortness of breath. EXAM: PORTABLE CHEST 1 VIEW COMPARISON:  CT 01/04/2019.  Chest x-ray 03/06/2019. FINDINGS: Cardiomegaly. No pulmonary venous congestion. Mild right base infiltrate. Bilateral pleural effusions. Right pleural effusion has improved from prior exam. No pneumothorax. Surgical clips over the right chest. IMPRESSION: 1.  Cardiomegaly.  No pulmonary venous congestion. 2. Mild right base infiltrate and bilateral pleural effusions again noted. Right pleural effusion has improved from prior exam. Electronically Signed   By: Marcello Moores   Register   On: 03/06/2019 12:38   Dg Chest Portable 1 View  Result Date: 03/06/2019 CLINICAL DATA:  85 year old female with shortness of breath. Recently diagnosed with lymphoma. EXAM: PORTABLE CHEST 1 VIEW COMPARISON:  CT Chest, Abdomen, and Pelvis 02/27/2019. FINDINGS: Portable AP semi upright view at 0208 hours. Moderate size right pleural effusion and small left pleural effusion appear not significantly changed. Stable cardiac size and mediastinal contours. Visualized tracheal air column is within normal limits. No pulmonary edema in the upper lungs. No pneumothorax or other confluent pulmonary opacity. No acute osseous abnormality identified. IMPRESSION: 1. Moderate size right and small left pleural effusions appear not significantly changed since 02/27/19. 2. No new cardiopulmonary abnormality. Electronically Signed   By: Genevie Ann M.D.   On: 03/06/2019 02:20   Vas [place] Lower Extremity Venous (dvt)  Result Date: 03/08/2019  Lower Venous Study Indications: Pulmonary embolism.  Risk Factors: Confirmed PE. Limitations: Poor ultrasound/tissue interface. Comparison Study: No prior studies. Performing Technologist: Oliver Hum RVT  Examination Guidelines: A complete evaluation includes B-mode imaging, spectral Doppler, color Doppler, and power Doppler as needed of all accessible portions of each vessel. Bilateral testing is considered an integral part of a complete examination. Limited examinations for reoccurring indications may be performed as noted.  +---------+---------------+---------+-----------+----------+--------------+  RIGHT     Compressibility Phasicity Spontaneity Properties Thrombus Aging  +---------+---------------+---------+-----------+----------+--------------+  CFV       Full            Yes       Yes                                    +---------+---------------+---------+-----------+----------+--------------+  SFJ       Full                                                              +---------+---------------+---------+-----------+----------+--------------+  FV Prox   Full                                                             +---------+---------------+---------+-----------+----------+--------------+  FV Mid    Full                                                             +---------+---------------+---------+-----------+----------+--------------+  FV Distal Full                                                             +---------+---------------+---------+-----------+----------+--------------+  PFV       Full                                                             +---------+---------------+---------+-----------+----------+--------------+  POP       Full            Yes       Yes                                    +---------+---------------+---------+-----------+----------+--------------+  PTV       Full                                                             +---------+---------------+---------+-----------+----------+--------------+  PERO      Full                                                             +---------+---------------+---------+-----------+----------+--------------+   +---------+---------------+---------+-----------+----------+--------------+  LEFT      Compressibility Phasicity Spontaneity Properties Thrombus Aging  +---------+---------------+---------+-----------+----------+--------------+  CFV       Full            Yes       Yes                                    +---------+---------------+---------+-----------+----------+--------------+  SFJ       Full                                                             +---------+---------------+---------+-----------+----------+--------------+  FV Prox   Full                                                             +---------+---------------+---------+-----------+----------+--------------+  FV Mid    Full                                                              +---------+---------------+---------+-----------+----------+--------------+  FV Distal Full                                                             +---------+---------------+---------+-----------+----------+--------------+  PFV       Full                                                             +---------+---------------+---------+-----------+----------+--------------+  POP       Full            Yes       Yes                                    +---------+---------------+---------+-----------+----------+--------------+  PTV       None                                             Acute           +---------+---------------+---------+-----------+----------+--------------+  PERO      Full                                                             +---------+---------------+---------+-----------+----------+--------------+  Gastroc   None                                             Acute           +---------+---------------+---------+-----------+----------+--------------+     Summary: Right: There is no evidence of deep vein thrombosis in the lower extremity. However, portions of this examination were limited- see technologist comments above. Left: Findings consistent with acute deep vein thrombosis involving the left posterior tibial veins, and left gastrocnemius veins. No cystic structure found in the popliteal fossa. Ultrasound characteristics of enlarged lymph nodes noted in the groin.  *See table(s) above for measurements and observations. Electronically signed by Harold Barban MD on 03/08/2019 at 12:43:36 AM.    Final      ASSESSMENT & PLAN:  EVANNE MATSUNAGA is a 85 y.o. female with:  1. Generalized lymphopathy - concerning for Non hodgkins lymphoma  2. Pulmonary Embolism -extensive .likely related to extensive malignancy  3. H/o Breast Cancer  PLAN: -Discussed patient's most recent labs  from 03/06/2019, CBC is as follows: all values are WNL except for Platelets at 72, Eosinophils Absolute at  0.6,Abs Immature Granulocytes at 0.08 -Discussed that the CT on 02/27/2019 revealed that she has generalized lymph nodes in neck and abdomin -Advised that a PET scan is recommend if covered by insurance to get a accurate initial staging/evaluation of lymphoma. Also shows if she is having bone marrow issues  -Discussed that we are still waiting for result from pathology  -Discussed that the pathologist thinks that it is a type of lymphoma but can not determine further information until the pathology result are completed.  -Discussed that lymph nodes in chest is contributing to coughing  -Discussed that part of the swelling in legs could be from age related changes in the blood vessels  -Discussed that swelling in her abdomin could be from enlarged spleen and lymph nodes. -Recommended adding Pepcid along with Zyrtec to help with itching.   FOLLOW UP Labs today PET/CT in 5 days  All of the patients questions were answered with apparent satisfaction. The patient knows to call the clinic with any problems, questions or concerns.  I spent 40 minutes counseling the patient face to face. The total time spent in the appointment was 60 minutes and more than 50% was on counseling and direct patient cares.    Sullivan Lone MD MS AAHIVMS [facility] Hematology/Oncology Physician [facility]  (Office):       [phone] (Work cell):  [phone] (Fax):           [phone]  03/27/2019 4:26 PM  I, Scot Dock, am acting as a scribe for Dr. Sullivan Lone.   .I have reviewed the above documentation for accuracy and completeness, and I agree with the above. Brunetta Genera MD

## 2019-03-27 NOTE — Telephone Encounter (Signed)
Scheduled per 11/05 los, checked patient in for labs.

## 2019-03-28 ENCOUNTER — Encounter (HOSPITAL_COMMUNITY): Payer: Self-pay | Admitting: Otolaryngology

## 2019-04-03 ENCOUNTER — Other Ambulatory Visit: Payer: Self-pay | Admitting: Oncology

## 2019-04-03 ENCOUNTER — Other Ambulatory Visit: Payer: Self-pay | Admitting: Hematology

## 2019-04-03 ENCOUNTER — Telehealth: Payer: Self-pay | Admitting: *Deleted

## 2019-04-03 MED ORDER — PREDNISONE 20 MG PO TABS: 20.0000 mg | ORAL_TABLET | Freq: Every day | ORAL | 0 refills | Status: DC

## 2019-04-03 NOTE — Telephone Encounter (Signed)
Patient's daughter called and asked about if results from biopsy available yet: "It's been 2 weeks" States mother's itching is unbearable and mother has now lost control of bowels.  She is taking Certrizine and some hydroxyzine she had available, but had never added Pepcid. Reviewed Dr.Kale's directions to take Pepcid w/certirizine with daughter. Dr. Irene Limbo given information. He stated he has spoken with pathologist and the biopsy was sent for further testing with results not yet available. He sent a prescription for prednisone to pt's pharmacy if Pepcid/certrizine is not successful controlling itching.  Contacted daughter [name]) and patient. She reports that patient itching improved after starting Pepcid yesterday, noting pt slept much better last night. Gave information regarding pathology reports. Daughter stated understanding of information.

## 2019-04-04 ENCOUNTER — Encounter (HOSPITAL_COMMUNITY): Payer: Self-pay | Admitting: Hematology

## 2019-04-04 ENCOUNTER — Ambulatory Visit: Payer: Medicare Other | Admitting: Podiatry

## 2019-04-04 DIAGNOSIS — H11423 Conjunctival edema, bilateral: Secondary | ICD-10-CM | POA: Diagnosis not present

## 2019-04-04 LAB — SURGICAL PATHOLOGY

## 2019-04-07 ENCOUNTER — Other Ambulatory Visit: Payer: Self-pay

## 2019-04-07 ENCOUNTER — Ambulatory Visit (HOSPITAL_BASED_OUTPATIENT_CLINIC_OR_DEPARTMENT_OTHER): Payer: Medicare Other

## 2019-04-07 DIAGNOSIS — E785 Hyperlipidemia, unspecified: Secondary | ICD-10-CM | POA: Diagnosis not present

## 2019-04-07 DIAGNOSIS — I10 Essential (primary) hypertension: Secondary | ICD-10-CM | POA: Diagnosis not present

## 2019-04-07 DIAGNOSIS — I081 Rheumatic disorders of both mitral and tricuspid valves: Secondary | ICD-10-CM | POA: Diagnosis not present

## 2019-04-07 DIAGNOSIS — I2609 Other pulmonary embolism with acute cor pulmonale: Secondary | ICD-10-CM | POA: Diagnosis not present

## 2019-04-07 DIAGNOSIS — I313 Pericardial effusion (noninflammatory): Secondary | ICD-10-CM | POA: Diagnosis not present

## 2019-04-07 DIAGNOSIS — C8598 Non-Hodgkin lymphoma, unspecified, lymph nodes of multiple sites: Secondary | ICD-10-CM | POA: Diagnosis not present

## 2019-04-08 ENCOUNTER — Ambulatory Visit (HOSPITAL_COMMUNITY)
Admission: RE | Admit: 2019-04-08 | Discharge: 2019-04-08 | Disposition: A | Discharge status: Home / Self Care | Payer: Medicare Other | Source: Ambulatory Visit | Attending: Hematology | Admitting: Hematology

## 2019-04-08 DIAGNOSIS — I313 Pericardial effusion (noninflammatory): Secondary | ICD-10-CM | POA: Insufficient documentation

## 2019-04-08 DIAGNOSIS — E785 Hyperlipidemia, unspecified: Secondary | ICD-10-CM | POA: Diagnosis not present

## 2019-04-08 DIAGNOSIS — C8598 Non-Hodgkin lymphoma, unspecified, lymph nodes of multiple sites: Secondary | ICD-10-CM | POA: Diagnosis not present

## 2019-04-08 DIAGNOSIS — C8330 Diffuse large B-cell lymphoma, unspecified site: Secondary | ICD-10-CM | POA: Diagnosis not present

## 2019-04-08 DIAGNOSIS — I081 Rheumatic disorders of both mitral and tricuspid valves: Secondary | ICD-10-CM | POA: Diagnosis not present

## 2019-04-08 DIAGNOSIS — I10 Essential (primary) hypertension: Secondary | ICD-10-CM | POA: Diagnosis not present

## 2019-04-08 DIAGNOSIS — I2609 Other pulmonary embolism with acute cor pulmonale: Secondary | ICD-10-CM | POA: Insufficient documentation

## 2019-04-08 LAB — GLUCOSE, CAPILLARY: Glucose-Capillary: 86 mg/dL (ref 70–99)

## 2019-04-08 MED ORDER — FLUDEOXYGLUCOSE F - 18 (FDG) INJECTION
5.9600 | Freq: Once | INTRAVENOUS | Status: AC | PRN
  Administered 2019-04-08: 5.96 via INTRAVENOUS

## 2019-04-10 ENCOUNTER — Inpatient Hospital Stay (HOSPITAL_BASED_OUTPATIENT_CLINIC_OR_DEPARTMENT_OTHER): Payer: Medicare Other | Admitting: Hematology

## 2019-04-10 ENCOUNTER — Other Ambulatory Visit: Payer: Self-pay

## 2019-04-10 DIAGNOSIS — L299 Pruritus, unspecified: Secondary | ICD-10-CM

## 2019-04-10 DIAGNOSIS — Z853 Personal history of malignant neoplasm of breast: Secondary | ICD-10-CM | POA: Diagnosis not present

## 2019-04-10 DIAGNOSIS — R591 Generalized enlarged lymph nodes: Secondary | ICD-10-CM | POA: Diagnosis not present

## 2019-04-10 DIAGNOSIS — C8440 Peripheral T-cell lymphoma, not classified, unspecified site: Secondary | ICD-10-CM

## 2019-04-10 DIAGNOSIS — I2699 Other pulmonary embolism without acute cor pulmonale: Secondary | ICD-10-CM | POA: Diagnosis not present

## 2019-04-10 DIAGNOSIS — Z7189 Other specified counseling: Secondary | ICD-10-CM | POA: Diagnosis not present

## 2019-04-10 MED ORDER — PREDNISONE 20 MG PO TABS: 40.0000 mg | ORAL_TABLET | Freq: Every day | ORAL | 0 refills | Status: DC

## 2019-04-10 NOTE — Progress Notes (Signed)
HEMATOLOGY/ONCOLOGY CONSULTATION NOTE  Date of Service: 04/10/2019  Patient Care Team: Lajean Manes, MD as PCP - General (Internal Medicine) Buford Dresser, MD as PCP - Cardiology (Cardiology)  REFERRING PHYSICIAN: Lajean Manes, MD  CHIEF COMPLAINTS/PURPOSE OF CONSULTATION:  Possible lymphoma      HISTORY OF PRESENTING ILLNESS:   Sarah [name] is a wonderful 85 y.o. female who has been referred to [place] by Lajean Manes, MD for evaluation and management of Possible lymphoma . She is accompanied today by [name] . The pt reports that she is doing well overall.   Pending Surgical pathology from 03/17/2019 Dr.Manny will contact clinic about pathology   August 25th/27th she began coughing and sneezing. On Sept 3 went to doctors and they said it was allergies. She began having rashes (little red dots). Went to PCP and they prescribed prednisone and for several days and the medication helped. But as soon as she completed the prescription symptoms reoccurred.   She had a chest X Ray done because she was having a severe cough on 02/17/2019 revealing "bilateral effusions in this patient with history of breast cancer,of uncertain significance with question of right lower lobe pulmonary nodule, consider chest CT for further assessment. Atherosclerotic changes in the thoracic aorta."  Dr. Amy Martinique biopsied the rashes on her back 02/21/2019 and it was determined that it could be because of an allergy to medication. Dr.Stoneking stopped her blood pressure medication from September- October but ther rash still present. The itching started 1 to 2 months ago. The rash would come and go and was mostly on her back. She still has rash on her left thigh.  She is using triaconazole and cetrizine.  She has lumps behind ear and abdomin and she states that they have swollen twice the size within 1 and half months  She has no appetite and has changes in her taste that began around  August. She has changes in bowels. They are more loose and smaller In size. She also has no control over bowels.  She feels fatigued and it has increased since August.   She has a cough that starts in September  that has made her horse.   She had a CT scan on 02/27/2019 because she was having severe cough and her stomach was very extended. Revealing "1. Extensive lymphadenopathy throughout the chest, abdomen, and pelvis, with index lymph nodes identified above. Splenomegaly. Findings are most consistent with lymphoma with recurrent, metastatic breast malignancy less favored. 2. There is generally osteopenic appearance of the skeleton. There are numerous subtle hypodense lesions, particularly of the pelvis (e.g. Series 2, image 88) and select vertebral bodies, for example L4 (series 8, image 92). This is suspicious although not definitive for osseous metastatic disease. Characterization for metabolic activity by nuclear scintigraphic bone scan or PET-CT would be helpful to further evaluate. 3. Moderate right, small left pleural effusions with associated atelectasis or consolidation. Subpleural radiation fibrosis of the anterior right lung. 4. There is a new subpleural pulmonary nodule of the anterior right middle lobe measuring 6 mm (series 4, image 105), nonspecific. There is no obvious pleural thickening or nodularity definitive for metastatic disease. 5.  Small volume ascites. 6. There is a fluid attenuation lesion of the pancreatic uncinate measuring 2.4 x 1.3 cm (series 2, image 63), not substantially changed when compared to remote prior MR examination dated 02/23/2012 and likely sequelae of a prior pseudocyst versus incidental pancreatic IPMN. 7.  Cardiomegaly."  Hospitalized from 03/06/2019-03/08/2019. She  presenting with acute shortness of breath, leg swelling,left hand swelling and some weight gain despite poor appetite. In ED, CTA chest revealed at least submassive PE, moderate right pleural  effusion, diffuse LAD concerning for lymphoma. Started on heparin drip. PCCM consulted for guidance. She had thoracocentesis with removal of 1200 cc cloudy exudative pleural fluid. Cardiology consulted for elevated which was thought to be demand ischemia from right heart strain in the setting of PE. Patient was transitioned to subcu Lovenox by pulmonology the next day and remained stable. Unusual appearance of tracheal air column at the thoracic inlet. Correlate with any history of swallowing difficulty or changes in voice with dedicated imaging with chest CT and or CT of the neck as indicated.  She has a nonhealing ulcer on her right tibialis anterior that starting weeping one week ago. Of note since the patient's last visit, pt has had CHEST XRAY - 2 VIEW (Accession [id number]) completed on 02/17/2019 with results revealing "Bilateral effusions in this patient with history of breast cancer,of uncertain significance with question of right lower lobepulmonary nodule, consider chest CT for furtherassessment.Unusual appearance of tracheal air column at the thoracic inlet.Correlate with any history of swallowing difficulty or changes in voice with dedicated imaging with chest CT and or CT of the neck as Indicated. Atherosclerotic changes in the thoracic aorta."  Of note since the patient's last visit, pt has had CT CHEST, ABDOMEN, AND PELVIS WITH CONTRAST (Accession [id number]) completed on 02/27/2019 with results revealing "1. Extensive lymphadenopathy throughout the chest, abdomen, and pelvis, with index lymph nodes identified above. Splenomegaly.Findings are most consistent with lymphoma with recurrent, metastatic breast malignancy less favored. 2. There is generally osteopenic appearance of the skeleton. There are numerous subtle hypodense lesions, particularly of the pelvis (e.g. Series 2, image 88) and select vertebral bodies, for example L4 (series 8, image 92). This is suspicious although not  definitive for osseous metastatic disease. Characterization for metabolic activity by nuclear scintigraphic bone scan or PET-CT would be helpful to further evaluate.  3. Moderate right, small left pleural effusions with associated atelectasis or consolidation. Subpleural radiation fibrosis of the anterior right lung.4. There is a new subpleural pulmonary nodule of the anterior right middle lobe measuring 6 mm (series 4, image 105), nonspecific. There is no obvious pleural thickening or nodularity definitive for metastatic disease.5.  Small volume ascites. 6. There is a fluid attenuation lesion of the pancreatic uncinate measuring 2.4 x 1.3 cm (series 2, image 63), not substantially changed when compared to remote prior MR examination dated 02/23/2012 and likely sequelae of a prior pseudocyst versus incidental pancreatic IPMN.7.  Cardiomegaly.8. Other chronic and incidental findings as detailed above. Aortic Atherosclerosis (ICD10-I70.0)."  Of note since the patient's last visit, pt has had PORTABLE CHEST 1 VIEW (Accession [id number]) completed on 03/06/2019 with results revealing "1. Moderate size right and small left pleural effusions appear not significantly changed since 02/27/19. 2. No new cardiopulmonary abnormality."  Of note since the patient's last visit, pt has had ECHOCARDIOGRAM  completed on 03/06/2019 with results revealing  "1. Left ventricular ejection fraction, by visual estimation, is 60 to 65%. The left ventricle has normal function. Normal left ventricular size. There is no left ventricular hypertrophy. 2. Left ventricular diastolic Doppler parameters are consistent with impaired relaxation pattern of LV diastolic filling. 3. Global right ventricle has mildly reduced systolic function.The right ventricular size is moderately enlarged. No increase in right ventricular wall thickness. 4. Left atrial size was normal. 5. Right atrial size was mildly  dilated. 6. Trivial pericardial effusion  is present. 7. The mitral valve is normal in structure. Trace mitral valve regurgitation. No evidence of mitral stenosis. 8. The tricuspid valve is normal in structure. Tricuspid valve regurgitation severe. 9. The aortic valve is normal in structure. Aortic valve regurgitation was not visualized by color flow Doppler. Structurally normal aortic valve, with no evidence of sclerosis or stenosis.10. The pulmonic valve was normal in structure. Pulmonic valve regurgitation is mild by color flow Doppler.11. Mildly elevated pulmonary artery systolic pressure.12. The inferior vena cava is normal in size with greater than 50% respiratory variability, suggesting right atrial pressure of 3 mmHg."  Of note since the patient's last visit, pt has had CT ANGIOGRAPHY CHEST WITH CONTRAST (Accession [id number]) completed on 03/06/2019 with results revealing "1. Pulmonary embolus arising from the distal left main pulmonary artery with extension into multiple left lower lobe pulmonary arterial branches. There is also incompletely obstructing pulmonary embolus in the proximal left upper lobe pulmonary artery. More distal pulmonary embolus in the right lower lobe pulmonary artery. Positive for acute PE with CT evidence of right heart strain (RV/LV Ratio = 1.6) consistent with at least submassive (intermediate risk) PE. The presence of right heart strain has been associated with an increased risk of morbidity and mortality. Please activate Code PE by paging [phone]. 2. Sizable pleural effusion on the right with smaller pleural effusion on the left. Consolidation and compressive atelectasis throughout most of the right lower lobe. Milder atelectasis left base. Nodular opacities on the right, likely metastatic foci.3.  Stable adenopathy compared to 1 week prior.4. Enlargement of spleen, incompletely visualized but documented CT 1 week prior. Concern for potential lymphoma. 5. Aortic atherosclerosis. No aneurysm or dissection  evident. Foci of coronary artery calcification noted."  Most recent lab results (03/06/2019) of CBC is as follows: all values are WNL except for Platelets at 72, Eosinophils Absolute at 0.6,Abs Immature Granulocytes at 0.08 .  On review of systems, pt reports rashes, digestive issues and denies back pain, abdominal pain and any other symptoms.   On PMHx the pt reports Hypothyroidism, MCI, Hypercholesterolemia, Tracheal anomaly, Hypertension, Pulmonary nodule, Atherosclerosis of aorta, anxiety, gait disorder, primary insomnia.   INTERVAL HISTORY: I connected with [name] on 04/10/19 at  3:40 PM EST by Telehealth and verified that I am speaking with the correct person using two identifiers.   I discussed the limitations, risks, security and privacy concerns of performing an evaluation and management service by telemedicine and the availability of in-person appointments. I also discussed with the patient that there may be a patient responsible charge related to this service. The patient expressed understanding and agreed to proceed.   Other persons participating in the visit and their role in the encounter: Daughter, [name]   Patients location: Home  Providers location: [place]   Chief Complaint: Possible lymphoma   CRISTA NUON is a 85 y.o. female here for evaluation and management of Possible lymphoma . The patient's last visit with [place] was on 03/27/2019. The pt reports that she is doing well overall.  The pt reports severe itching  She has two new weeping ulcers on her leg  Of note since the patient's last visit, pt has had NM PET Image Initial (PI) Skull Base To Thigh (Accession [id number]) completed on 04/08/2019 with results revealing "1. Widespread hypermetabolic adenopathy in the neck, chest, and abdomen/pelvis, primarily in the Deauville 4 and Deauville 5 range. There is also hypermetabolic activity along the posterior nasopharynx in lingual  tonsillar regions potentially  indicating sites of involvement. 2. The subtle hypodensities in the spine and bony pelvis are not appreciably hypermetabolic may be incidental, surveillance is suggested. 3. Bilateral thyroid activity but especially on the left. Probably from thyroiditis. 4. A 6 mm subpleural nodule anteriorly in the right lower lobe not appreciably hypermetabolic but is below sensitive PET-CT size thresholds and merits surveillance. 5. Mildly accentuated diffuse splenic activity without splenomegaly or focal splenic lesion identified. 6. Other imaging findings of potential clinical significance: Aortic Atherosclerosis (ICD10-I70.0). Coronary atherosclerosis. Large right and small left pleural effusions. Moderate cardiomegaly. Small pericardial effusion. Mild mesenteric edema. Large left renal cyst. Fullness of both renal collecting systems with suspected nonobstructive left nephrolithiasis. Degenerative grade 1 anterolisthesis at L4-5 at L5-S1. Trace pelvic ascites."  Lab results today 03/27/2019 of CBC w/diff and CMP is as follows: all values are WNL except for Eosinophils Absolute at 1.5, total protein at 6.3, AST at 14 -03/27/2019 LDH at 252.  On review of systems, pt reports uncontrollable itching, coughing and denies any other symptoms.   MEDICAL HISTORY:  Past Medical History:  Diagnosis Date   Allergy    codeine, thiazides   Anxiety    new dx   Arthritis    Breast cancer ([facility]) 03/14/12   bx=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,ER/PR=+,upper inner quad   Cancer ([facility])    breast   Cataract    DVT (deep venous thrombosis) ([facility]) 02/2019   left leg   Dyspnea    Edema    both legs feet and toe, abdomen   Glaucoma    laser treated years ago   HOH (hard of hearing)    Hyperlipemia    Hypertension    Hypothyroidism    Pancreatic cyst    benign   PONV (postoperative nausea and vomiting)    Pulmonary embolism ([facility]) 02/2019   bilateral    Radiation  06/11/2012-07/12/2012   17 sessions 4250 cGy, 3 sessions 750 cGy   Vertigo    Wears glasses    Wears partial dentures    partial upper     SURGICAL HISTORY: Past Surgical History:  Procedure Laterality Date   ABDOMINAL HYSTERECTOMY  1966   1/2 ovary left in    [place]   lumpectomy-lt   CATARACT EXTRACTION     b/l   COLONOSCOPY     EXCISION MASS NECK Left 03/17/2019    EXCISION MASS NECK (Left Neck)   EXCISION MASS NECK Left 03/17/2019   Procedure: EXCISION MASS NECK;  Surgeon: Helayne Seminole, MD;  Location: [facility];  Service: ENT;  Laterality: Left;   EYE SURGERY Bilateral    bilateral cataract removal   JOINT REPLACEMENT  2013   rt total knee   JOINT REPLACEMENT  1995   lt total knee   PARTIAL MASTECTOMY WITH NEEDLE LOCALIZATION  04/09/2012   Procedure: PARTIAL MASTECTOMY WITH NEEDLE LOCALIZATION;  Surgeon: Adin Hector, MD;  Location: [facility];  Service: General;  Laterality: Right;   SKIN BIOPSY Left 03/17/2019   LEFT THIGH   SKIN BIOPSY Left 03/17/2019   Procedure: Skin Biopsy Left Thigh;  Surgeon: Helayne Seminole, MD;  Location: [facility];  Service: ENT;  Laterality: Left;   TONSILLECTOMY     TOTAL KNEE ARTHROPLASTY  08/16/2011   Procedure: TOTAL KNEE ARTHROPLASTY;  Surgeon: Ninetta Lights, MD;  Location: [facility];  Service: Orthopedics;  Laterality: Right;     SOCIAL HISTORY: Social History  Socioeconomic History   Marital status: Widowed    Spouse name: Not on file   Number of children: 2   Years of education: Not on file   Highest education level: Not on file  Occupational History   Occupation: Retired    Comment: Interior and spatial designer strain: Not on file   Food insecurity    Worry: Not on file    Inability: Not on Lexicographer needs    Medical: Not on file    Non-medical: Not on file  Tobacco Use   Smoking status: Never Smoker   Smokeless  tobacco: Never Used  Substance and Sexual Activity   Alcohol use: No   Drug use: No   Sexual activity: Not Currently  Lifestyle   Physical activity    Days per week: Not on file    Minutes per session: Not on file   Stress: Not on file  Relationships   Social connections    Talks on phone: Not on file    Gets together: Not on file    Attends religious service: Not on file    Active member of club or organization: Not on file    Attends meetings of clubs or organizations: Not on file    Relationship status: Not on file   Intimate partner violence    Fear of current or ex partner: Not on file    Emotionally abused: Not on file    Physically abused: Not on file    Forced sexual activity: Not on file  Other Topics Concern   Not on file  Social History Narrative   Not on file     FAMILY HISTORY: Family History  Problem Relation Age of Onset   Heart disease Father    Cancer Maternal Aunt        stomach     ALLERGIES:   is allergic to codeine; latex; and thiazide-type diuretics.   MEDICATIONS:  Current Outpatient Medications  Medication Sig Dispense Refill   amLODipine (NORVASC) 2.5 MG tablet Take 2.5-5 mg by mouth See admin instructions. 5 mg in the morning, 2.5 mg in the evening     apixaban (ELIQUIS) 5 MG TABS tablet Take 2 tablets (10mg ) twice daily for 7 days, then 1 tablet (5mg ) twice daily 60 tablet 0   apixaban (ELIQUIS) 5 MG TABS tablet Take 1 tablet (5 mg total) by mouth 2 (two) times daily. 60 tablet 11   bacitracin ointment Apply 1 application topically every 8 (eight) hours. 120 g 0   benzonatate (TESSALON) 200 MG capsule Take 1 capsule (200 mg total) by mouth 3 (three) times daily. 20 capsule 0   Calcium Carbonate (CALCIUM 600 PO) Take 600 mg by mouth daily.     cetirizine (ZYRTEC) 10 MG tablet Take 10 mg by mouth daily.      Cholecalciferol (VITAMIN D) 50 MCG (2000 UT) tablet Take 2,000 Units by mouth 2 (two) times daily.      Cyanocobalamin (VITAMIN B-12 PO) Take 3,000 mcg by mouth daily.      fluticasone (FLONASE) 50 MCG/ACT nasal spray Place 1 spray into both nostrils daily. 16 g 1   fluticasone furoate-vilanterol (BREO ELLIPTA) 100-25 MCG/INH AEPB Inhale 1 puff into the lungs daily. 60 each 0   hydrOXYzine (ATARAX/VISTARIL) 25 MG tablet Take 1 tablet (25 mg total) by mouth at bedtime as needed for itching. 30 tablet 0   Krill Oil (OMEGA-3) 500 MG CAPS Take  500 mg by mouth daily.     levothyroxine (SYNTHROID) 25 MCG tablet Take 25 mcg by mouth daily before breakfast.      Multiple Vitamins-Minerals (PRESERVISION AREDS PO) Take 1 capsule by mouth 2 (two) times daily.      olopatadine (PATANOL) 0.1 % ophthalmic solution Place 1 drop into both eyes 2 (two) times daily.     pantoprazole (PROTONIX) 40 MG tablet Take 1 tablet (40 mg total) by mouth daily. 90 tablet 1   predniSONE (DELTASONE) 20 MG tablet Take 1 tablet (20 mg total) by mouth daily with breakfast. 15 tablet 0   triamcinolone cream (KENALOG) 0.1 % Apply 1 application topically 3 (three) times daily.      vitamin E 400 UNIT capsule Take 400 Units by mouth daily.     zolpidem (AMBIEN) 10 MG tablet Take 2.5 mg by mouth at bedtime.      No current facility-administered medications for this visit.      REVIEW OF SYSTEMS:   A 10+ POINT REVIEW OF SYSTEMS WAS OBTAINED including neurology, dermatology, psychiatry, cardiac, respiratory, lymph, extremities, GI, GU, Musculoskeletal, constitutional, breasts, reproductive, HEENT.  All pertinent positives are noted in the HPI.  All others are negative.  PHYSICAL EXAMINATION: There were no vitals filed for this visit. Wt Readings from Last 3 Encounters:  03/27/19 120 lb (54.4 kg)  03/17/19 120 lb 5.9 oz (54.6 kg)  03/13/19 124 lb 6.4 oz (56.4 kg)   There is no height or weight on file to calculate BMI.     Telehealth Visit 04/10/19    LABORATORY DATA:  I have reviewed the data as listed  CBC  Latest Ref Rng & Units 03/27/2019 03/07/2019 03/06/2019  WBC 4.0 - 10.5 K/uL 8.5 8.4 8.3  Hemoglobin 12.0 - 15.0 g/dL 13.5 13.1 14.0  Hematocrit 36.0 - 46.0 % 42.6 39.5 45.3  Platelets 150 - 400 K/uL 211 214 72(L)    CMP Latest Ref Rng & Units 03/27/2019 03/08/2019 03/07/2019  Glucose 70 - 99 mg/dL 98 134(H) 142(H)  BUN 8 - 23 mg/dL 19 13 17   Creatinine 0.44 - 1.00 mg/dL 0.75 0.68 0.76  Sodium 135 - 145 mmol/L 142 139 140  Potassium 3.5 - 5.1 mmol/L 4.4 4.1 3.1(L)  Chloride 98 - 111 mmol/L 105 109 106  CO2 22 - 32 mmol/L 26 21(L) 23  Calcium 8.9 - 10.3 mg/dL 10.0 8.6(L) 8.6(L)  Total Protein 6.5 - 8.1 g/dL 6.3(L) - -  Total Bilirubin 0.3 - 1.2 mg/dL 0.5 - -  Alkaline Phos 38 - 126 U/L 104 - -  AST 15 - 41 U/L 14(L) - -  ALT 0 - 44 U/L 12 - -   Component     Latest Ref Rng & Units 03/27/2019  Hepatitis B Surface Ag     NON REACTIVE NON REACTIVE  Hep B Core Total Ab     NON REACTIVE NON REACTIVE  HCV Ab     NON REACTIVE NON REACTIVE  LDH     98 - 192 U/L 252 (H)     04/08/2019 NM PET Image Initial (PI) Skull Base To Thigh (Accession [id number])  04/04/2019 PATHOLOGY   04/07/2019 ECHOCARDIOGRAM  03/28/2019 PATHOLOGY   03/06/2019 CT ANGIOGRAPHY CHEST WITH CONTRAST (Accession [id number])  04/07/2019 ECHOCARDIOGRAM  03/06/2019 ECHOCARDIOGRAM     03/06/2019 PORTABLE CHEST 1 VIEW (Accession [id number])    02/27/2019 CT CHEST, ABDOMEN, AND PELVIS WITH CONTRAST (Accession [id number])    02/17/2019 CHEST XRAY - 2 VIEW (Accession  [id number])    RADIOGRAPHIC STUDIES: I have personally reviewed the radiological images as listed and agreed with the findings in the report. Dg Chest 2 View  Result Date: 03/13/2019 CLINICAL DATA:  Follow-up pleural effusion EXAM: CHEST - 2 VIEW COMPARISON:  03/06/2019 FINDINGS: Cardiac shadow is stable. Aortic calcifications are again seen. Small bilateral pleural effusions are again noted and stable. Right basilar consolidation is noted  increased from the prior exam. No other focal infiltrate is seen. IMPRESSION: Increasing right basilar consolidation. Stable pleural effusions bilaterally. Electronically Signed   By: Inez Catalina M.D.   On: 03/13/2019 11:07   Nm Pet Image Initial (pi) Skull Base To Thigh  Result Date: 04/08/2019 CLINICAL DATA:  Initial treatment strategy for large B-cell lymphoma. EXAM: NUCLEAR MEDICINE PET SKULL BASE TO THIGH TECHNIQUE: 6.0 mCi F-18 FDG was injected intravenously. Full-ring PET imaging was performed from the skull base to thigh after the radiotracer. CT data was obtained and used for attenuation correction and anatomic localization. Fasting blood glucose: 86 mg/dl COMPARISON:  Multiple exams, including CT scans from 03/06/2019 and 02/27/2019 FINDINGS: Mediastinal blood pool activity: SUV max 1.8 Liver activity: SUV max 2.8 NECK: Posterior nasopharyngeal hypermetabolic activity, maximum SUV 7.6 on the left and 6.2 on the right. Lingual tonsillar activity maximum SUV 7.7 (Deauville 5). Hypermetabolic bilateral superficial parotid, level IIa, level IIb, level V, level Ia, level Ib, level III, and level IV hypermetabolic adenopathy noted. A left level IIa lymph node measuring 1.2 cm in short axis on image 28/4 has maximum SUV of 9.3, Deauville 5. An index left level Roman numeral 4 lymph node measuring 1.1 cm in short axis on image 38/4 has maximum SUV of 5.0, Deauville 4. Bilateral thyroid activity is present, but is greater on the enlarged left thyroid lobe, maximum SUV of 6.2, Deauville 4. The right thyroid lobe has maximum SUV of 3.6, Deauville 4. Incidental CT findings: Bilateral common carotid atherosclerotic calcification. CHEST: Bilateral hypermetabolic axillary, supraclavicular, right paratracheal, left paratracheal, AP window, bilateral hilar, right infrahilar, and subcarinal adenopathy. Right paratracheal node 1.4 cm in short axis on image 53/4, maximum SUV 7.6, Deauville 5. Right hilar lymph node 1.1  cm in short axis on image 64/4, maximum SUV 7.2, Deauville 5. Subcarinal lymph node approximately 1.3 cm in short axis on image 71/4, maximum SUV 6.1, Deauville 4. Incidental CT findings: Large right and small left pleural effusions with passive atelectasis. Coronary, aortic arch, and branch vessel atherosclerotic vascular disease. Moderate cardiomegaly. Small pericardial effusion. Right lower lobe anterior subpleural nodule 0.6 cm at its base, image 85/4, not appreciably hypermetabolic, stable in size. This was previously reported on chest CT as right middle lobe but I believe that due to volume loss this is actually in the right lower lobe. ABDOMEN/PELVIS: Hypermetabolic right gastric, gastrohepatic ligament, porta hepatis, peripancreatic, retroperitoneal, mesenteric, common iliac, external iliac, and inguinal adenopathy identified. Index peripancreatic node measuring 1.2 cm in short axis on image 103/4 has maximum SUV of 9.0, Deauville L5. Portacaval node measuring 2.0 cm in short axis on image 108/4 has a maximum SUV of 5.2, Deauville 4. Conglomerate left periaortic adenopathy measuring 2.3 cm in short axis on image 116/4 has maximum SUV of 3.3, Deauville 4. A left common iliac lymph node measuring 0.9 cm in short axis on image 133/4 has maximum SUV of 4.9, Deauville 4. A left inguinal lymph node measuring 1.2 cm in short axis on image 165/4 has maximum SUV of 3.8, Deauville 4. The spleen measures 11.4 by 7.0  by 9.1 cm (volume = 380 cm^3) and has metabolic uptake diffusely mildly higher than the liver, with the typical maximum SUV of 3.7, Deauville 4. Incidental CT findings: Aortoiliac atherosclerotic vascular disease. Mild mesenteric edema. Large left renal cyst. Fullness of both renal collecting systems. Punctate 1-2 mm left kidney lower pole calculi. Trace pelvic ascites. SKELETON: Minimal heterogeneity of marrow activity without definite focal hypermetabolic lesion identified. The subtle hypodensities in the  vertebra and bony pelvis are not discernibly hypermetabolic. Incidental CT findings: Degenerative grade 1 anterolisthesis at L4-5 and L5-S1. Degenerative subcortical cyst formation in the anterior superior right acetabulum. IMPRESSION: 1. Widespread hypermetabolic adenopathy in the neck, chest, and abdomen/pelvis, primarily in the Deauville 4 and Deauville 5 range. There is also hypermetabolic activity along the posterior nasopharynx in lingual tonsillar regions potentially indicating sites of involvement. 2. The subtle hypodensities in the spine and bony pelvis are not appreciably hypermetabolic may be incidental, surveillance is suggested. 3. Bilateral thyroid activity but especially on the left. Probably from thyroiditis. 4. A 6 mm subpleural nodule anteriorly in the right lower lobe not appreciably hypermetabolic but is below sensitive PET-CT size thresholds and merits surveillance. 5. Mildly accentuated diffuse splenic activity without splenomegaly or focal splenic lesion identified. 6. Other imaging findings of potential clinical significance: Aortic Atherosclerosis (ICD10-I70.0). Coronary atherosclerosis. Large right and small left pleural effusions. Moderate cardiomegaly. Small pericardial effusion. Mild mesenteric edema. Large left renal cyst. Fullness of both renal collecting systems with suspected nonobstructive left nephrolithiasis. Degenerative grade 1 anterolisthesis at L4-5 at L5-S1. Trace pelvic ascites. Electronically Signed   By: Van Clines M.D.   On: 04/08/2019 13:39     ASSESSMENT & PLAN:   MEGANNE RITA is a 85 y.o. female with:  1. 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 Widespread hypermetabolic adenopathy in the neck, chest, and abdomen/pelvis, primarily in the Deauville 4 and Deauville 5 range. There is also  hypermetabolic activity along the posterior nasopharynx in lingual tonsillar regions potentially indicating sites of involvement. 2. The subtle hypodensities in the spine and bony pelvis are not appreciably hypermetabolic may be incidental, surveillance is suggested. 3. Bilateral thyroid activity but especially on the left. Probably from thyroiditis. 4. A 6 mm subpleural nodule anteriorly in the right lower lobe not appreciably hypermetabolic but is below sensitive PET-CT size thresholds and merits surveillance. 5. Mildly accentuated diffuse splenic activity without splenomegaly or focal splenic lesion identified. 6. Other imaging findings of potential clinical significance: Aortic Atherosclerosis (ICD10-I70.0). Coronary atherosclerosis. Large right and small left pleural effusions. Moderate cardiomegaly. Small pericardial effusion. Mild mesenteric edema. Large left renal cyst. Fullness of both renal collecting systems with suspected nonobstructive left nephrolithiasis. Degenerative grade 1 anterolisthesis at L4-5 at L5-S1. Trace pelvic ascites."   2. Pulmonary Embolism -extensive .likely related to extensive malignancy  3. H/o Breast Cancer  -The patient had bilateral diagnostic  mammography at [facility] 07/11/2011. This  showed some calcifications in the right  breast, which seemed a little bit more  prominent than prior. In the left breast  there was an area of possible  architectural distortion. However left  breast ultrasound the same day showed  no abnormality. 6 month followup was  suggested, and on 02/28/2012 the  patient again had bilateral diagnostic  mammography, now with right  ultrasonography. The microcalcifications  in the right breast appeared increased.  Ultrasound showed a hypoechoic lesion  measuring 5 mm, with no associated  shadowing. This had been previously  noted and appeared unchanged. Anterior  to that there was a small hypoechoic  mass measuring 4 mm in diameter.  There was felt to be  suspicious, and on  03/14/2012 the patient underwent biopsy  of the right breast mass, showing ([id number]  [id number]) ductal carcinoma in situ,  intermediate grade, estrogen receptor  100% and progesterone receptor 100%  positive.   -Bilateral breast MRI was obtained  03/26/2012. This showed only post  biopsy changes in the right breast,  associated with an area of non-masslike  enhancement measuring 2.4 cm. The  left breast was unremarkable, and there  was no enlarged axillary or internal  mammary adenopathy noted.  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 The patient's  subsequent history is as detailed below.  4.   Spleen 2 finger breadths  below costal margin. +2 pedel edema, stiffness in calf   PLAN: -Discussed pt labwork today, 01/08/19; CBC w/diff and CMP is as follows: all values are WNL except for Eosinophils Absolute at 1.5, total protein at 6.3, AST at 14 -03/27/2019 LDH at 252. -Discussed pathologies and testing confirm that pt has stage 3/4 non hodgkin lymphoma angioimmunoblastic t-cell lymphoma which is CD30+. Discussed the possibility of infiltrating the skin, brain and other organs   -Discussed that skin biopsy was positive for lymphoma. Discussed starting higher dose of steroids to help with itching.  (Prednisone 40mg  po daily for 5-7 days) -Discussed 04/08/2019 (PI) Skull Base To Thigh (Accession [id number]) "1. Widespread hypermetabolic adenopathy in the neck, chest, and abdomen/pelvis, primarily in the Deauville 4 and Deauville 5 range. There is also hypermetabolic activity along the posterior nasopharynx in lingual tonsillar regions potentially indicating sites of involvement. 2. The subtle hypodensities in the spine and bony pelvis are not appreciably hypermetabolic may be incidental, surveillance is suggested. 3. Bilateral  thyroid activity but especially on the left. Probably from thyroiditis. 4. A 6 mm subpleural nodule anteriorly in the right lower lobe not appreciably hypermetabolic but is below sensitive PET-CT size thresholds and merits surveillance. 5. Mildly accentuated diffuse splenic activity without splenomegaly or focal splenic lesion identified. 6. Other imaging findings of potential clinical significance: Aortic Atherosclerosis (ICD10-I70.0). Coronary atherosclerosis. Large right and small left pleural effusions. Moderate cardiomegaly. Small pericardial effusion. Mild mesenteric edema. Large left renal cyst. Fullness of both renal collecting systems with suspected nonobstructive left nephrolithiasis. Degenerative grade 1 anterolisthesis at L4-5 at L5-S1. Trace pelvic ascites." -Discussed 04/07/2019 Echocardiogram - normal EF -Discussed treatment option given pt's age. Advised that aggressive treatment would have higher toxicity but is an option. A-CHP (as per ECHELON-2 trial) -Discussed how treatment can effect bone marrow and blood counts and other potential treatment toxicities  Discussed that we could do just targeted therapy (Brentuximab) but it would not be curative. Pt is not a candidate for BM transplantation for consolidation. -Discussed when to start treatment. Pt's daughter would like to come in for a visit with her brother, Nicki Reaper and the pt - will setup follow up on 04/21/2019 at 3:30pm -obtained verbal consent for port a cath placement (will need to hold Eliquis for 48h prior to placement) -chemo-counseling for A-CHP -Discussed monitoring lesions/ ulcers on pts leg -Discussed that pt could have had non hodgkin lymphoma angioimmunoblastic t-cell lymphoma a few month prior  to initial symptoms. Advised that pt has a rapidly growing lymphoma.  FOLLOW UP: Chemo-counseling in 2-3 days Schedule to start [facility] chemotherapy 12/32020 -port a cath placement within 1 week (will need to hold Eliquis for 48h  prior to port a cath placement) -f/u with Dr Irene Limbo 11/30 3:30pm with labs  I connected with  [name] and her daughter, [name] on 04/10/19 by a video enabled telemedicine application and verified that I am speaking with the correct person using two identifiers.   I discussed the limitations of evaluation and management by telemedicine. The patient expressed understanding and agreed to proceed.   The total time spent in the appt was 45 minutes and more than 50% was on counseling and direct patient cares.  All of the patient's questions were answered with apparent satisfaction. The patient knows to call the clinic with any problems, questions or concerns.   Sullivan Lone MD MS AAHIVMS [facility] Hematology/Oncology Physician [facility]  (Office):       [phone] (Work cell):  [phone] (Fax):           [phone]  04/10/2019 2:26 PM  I, Scot Dock, am acting as a scribe for Dr. Sullivan Lone.   .I have reviewed the above documentation for accuracy and completeness, and I agree with the above. Brunetta Genera MD

## 2019-04-11 ENCOUNTER — Telehealth: Payer: Self-pay | Admitting: Hematology

## 2019-04-11 NOTE — Telephone Encounter (Signed)
No los per 11/19,

## 2019-04-14 ENCOUNTER — Telehealth: Payer: Self-pay | Admitting: *Deleted

## 2019-04-14 ENCOUNTER — Encounter: Payer: Self-pay | Admitting: Critical Care Medicine

## 2019-04-14 ENCOUNTER — Ambulatory Visit (INDEPENDENT_AMBULATORY_CARE_PROVIDER_SITE_OTHER): Payer: Medicare Other | Admitting: Critical Care Medicine

## 2019-04-14 VITALS — BP 118/56 | HR 80 | Temp 98.0°F | Ht 64.0 in | Wt 111.6 lb

## 2019-04-14 DIAGNOSIS — Z7189 Other specified counseling: Secondary | ICD-10-CM | POA: Insufficient documentation

## 2019-04-14 DIAGNOSIS — H18593 Other hereditary corneal dystrophies, bilateral: Secondary | ICD-10-CM | POA: Diagnosis not present

## 2019-04-14 DIAGNOSIS — H40013 Open angle with borderline findings, low risk, bilateral: Secondary | ICD-10-CM | POA: Diagnosis not present

## 2019-04-14 DIAGNOSIS — Z961 Presence of intraocular lens: Secondary | ICD-10-CM | POA: Diagnosis not present

## 2019-04-14 DIAGNOSIS — I2609 Other pulmonary embolism with acute cor pulmonale: Secondary | ICD-10-CM | POA: Diagnosis not present

## 2019-04-14 DIAGNOSIS — H16223 Keratoconjunctivitis sicca, not specified as Sjogren's, bilateral: Secondary | ICD-10-CM | POA: Diagnosis not present

## 2019-04-14 DIAGNOSIS — C8440 Peripheral T-cell lymphoma, not classified, unspecified site: Secondary | ICD-10-CM | POA: Insufficient documentation

## 2019-04-14 DIAGNOSIS — H3554 Dystrophies primarily involving the retinal pigment epithelium: Secondary | ICD-10-CM | POA: Diagnosis not present

## 2019-04-14 DIAGNOSIS — J910 Malignant pleural effusion: Secondary | ICD-10-CM | POA: Diagnosis not present

## 2019-04-14 DIAGNOSIS — H11423 Conjunctival edema, bilateral: Secondary | ICD-10-CM | POA: Diagnosis not present

## 2019-04-14 DIAGNOSIS — H18452 Nodular corneal degeneration, left eye: Secondary | ICD-10-CM | POA: Diagnosis not present

## 2019-04-14 MED ORDER — PROCHLORPERAZINE MALEATE 10 MG PO TABS: 10.0000 mg | ORAL_TABLET | Freq: Four times a day (QID) | ORAL | 1 refills | Status: DC | PRN

## 2019-04-14 MED ORDER — DEXAMETHASONE 4 MG PO TABS: ORAL_TABLET | ORAL | 1 refills | Status: DC

## 2019-04-14 MED ORDER — LIDOCAINE-PRILOCAINE 2.5-2.5 % EX CREA: TOPICAL_CREAM | CUTANEOUS | 3 refills | Status: DC

## 2019-04-14 MED ORDER — ONDANSETRON HCL 8 MG PO TABS: 8.0000 mg | ORAL_TABLET | Freq: Two times a day (BID) | ORAL | 1 refills | Status: DC | PRN

## 2019-04-14 NOTE — Progress Notes (Addendum)
Synopsis: Referred in October 2020 for pleural effusion by Lajean Manes, MD.  Subjective:   PATIENT ID: Sarah [name] GENDER: female DOB: May 24, 1932, MRN: [id number]  Chief Complaint  Patient presents with   Follow-up     Sarah [name] is an 85 year old woman with a history of angioimmunoblastic T-cell lymphoma, MPE, and submassive PE who presents for follow-up.  She met with oncology last week, and is tentatively supposed to get a port and start chemotherapy after that.  She continues to complain of her most significant symptom being itching that prevents sleep.  She continues to take Zyrtec, prednisone, hydroxyzine, without relief.  She stopped taking Pepcid because it did not work.  She has been doing well on anticoagulation.  She denies shortness of breath, chest pain.  She has been losing weight.  Her daughter had to buy her new close.  Her appetite has been poor and she is on early satiety.  No significant nausea.  She wants to know if she can stop taking Breo-no history of asthma or tobacco abuse.  No significant bleeding on anticoagulation.  Sarah [name] was accompanied by her daughter again at today's visit.       OV 03/13/2019: Sarah [name] is an 85 year old woman who was referred for follow-up of a pleural effusion and a submassive PE.  She is accompanied by her daughter Sarah [name].  She was evaluated in the emergency department last week after presenting with acute onset of shortness of breath and heart racing where she was found to have a large right-sided pleural effusion, small left pleural effusion, and submassive PE.  She was started on Lovenox in the hospital.  She underwent drainage of her right pleural effusion, without a significant change in her symptoms.  Since August she has had significant pruritus with a less noticeable rash, dry cough, abdominal distention, edema in her left arm and both legs, and reduced appetite.  She had undergone CT scanning with her primary  care provider, who had noted adenopathy.  She is planning for an excisional biopsy with ENT next week.  Her cough has been bothersome, intermittent, sometimes mild, but episodically very severe and refractory.  She has not had sputum or hemoptysis.  During the same timeframe she has noticed pruritus that is severe enough to keep her awake at night and a rash.  She has tried cetirizine, which did not help her symptoms.  Before the PE, she did not have shortness of breath associated with her cough.  Since her hospitalization she has had reduced exercise tolerance, but before August was very active with no activity limitations.  She has not required oxygen since discharge from the hospital.  She has no personal history of VTE or miscarriages.  Her mother had hemophilia, but there have been no clotting disorders in the family.  She has not had any recent surgeries, extensive travel, significant injuries, or immobility to explain her DVT and PE.  She has a history of breast cancer in 2013, status post lumpectomy with radiation.  She never had chemotherapy.  Past Medical History:  Diagnosis Date   Allergy    codeine, thiazides   Anxiety    new dx   Arthritis    Breast cancer ([place]) 03/14/12   bx=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,ER/PR=+,upper inner quad   Cancer (HCC)    breast   Cataract    DVT (deep venous thrombosis) ([place]) 02/2019   left leg   Dyspnea    Edema  both legs feet and toe, abdomen   Glaucoma    laser treated years ago   HOH (hard of hearing)    Hyperlipemia    Hypertension    Hypothyroidism    Pancreatic cyst    benign   PONV (postoperative nausea and vomiting)    Pulmonary embolism ([place]) 02/2019   bilateral    Radiation 06/11/2012-07/12/2012   17 sessions 4250 cGy, 3 sessions 750 cGy   Vertigo    Wears glasses    Wears partial dentures    partial upper     Family History  Problem Relation Age of Onset   Heart disease  Father    Cancer Maternal Aunt        stomach     Past Surgical History:  Procedure Laterality Date   ABDOMINAL HYSTERECTOMY  1966   1/2 ovary left in    [place]   lumpectomy-lt   CATARACT EXTRACTION     b/l   COLONOSCOPY     EXCISION MASS NECK Left 03/17/2019    EXCISION MASS NECK (Left Neck)   EXCISION MASS NECK Left 03/17/2019   Procedure: EXCISION MASS NECK;  Surgeon: Helayne Seminole, MD;  Location: [facility];  Service: ENT;  Laterality: Left;   EYE SURGERY Bilateral    bilateral cataract removal   JOINT REPLACEMENT  2013   rt total knee   JOINT REPLACEMENT  1995   lt total knee   PARTIAL MASTECTOMY WITH NEEDLE LOCALIZATION  04/09/2012   Procedure: PARTIAL MASTECTOMY WITH NEEDLE LOCALIZATION;  Surgeon: Adin Hector, MD;  Location: [facility];  Service: General;  Laterality: Right;   SKIN BIOPSY Left 03/17/2019   LEFT THIGH   SKIN BIOPSY Left 03/17/2019   Procedure: Skin Biopsy Left Thigh;  Surgeon: Helayne Seminole, MD;  Location: [facility];  Service: ENT;  Laterality: Left;   TONSILLECTOMY     TOTAL KNEE ARTHROPLASTY  08/16/2011   Procedure: TOTAL KNEE ARTHROPLASTY;  Surgeon: Ninetta Lights, MD;  Location: [facility];  Service: Orthopedics;  Laterality: Right;    Social History   Socioeconomic History   Marital status: Widowed    Spouse name: Not on file   Number of children: 2   Years of education: Not on file   Highest education level: Not on file  Occupational History   Occupation: Retired    Comment: Interior and spatial designer strain: Not on file   Food insecurity    Worry: Not on file    Inability: Not on Lexicographer needs    Medical: Not on file    Non-medical: Not on file  Tobacco Use   Smoking status: Never Smoker   Smokeless tobacco: Never Used  Substance and Sexual Activity   Alcohol use: No   Drug use: No   Sexual activity: Not Currently  Lifestyle     Physical activity    Days per week: Not on file    Minutes per session: Not on file   Stress: Not on file  Relationships   Social connections    Talks on phone: Not on file    Gets together: Not on file    Attends religious service: Not on file    Active member of club or organization: Not on file    Attends meetings of clubs or organizations: Not on file    Relationship status: Not on file   Intimate partner violence  Fear of current or ex partner: Not on file    Emotionally abused: Not on file    Physically abused: Not on file    Forced sexual activity: Not on file  Other Topics Concern   Not on file  Social History Narrative   Not on file     Allergies  Allergen Reactions   Codeine Other (See Comments)    Reaction not recalled   Latex    Thiazide-Type Diuretics Other (See Comments)    Blurry vision?? (patient stated she has macular degeneration)     Immunization History  Administered Date(s) Administered   Influenza, High Dose Seasonal PF 02/24/2017, 02/24/2019    Outpatient Medications Prior to Visit  Medication Sig Dispense Refill   amLODipine (NORVASC) 2.5 MG tablet Take 2.5-5 mg by mouth See admin instructions. 5 mg in the morning, 2.5 mg in the evening     apixaban (ELIQUIS) 5 MG TABS tablet Take 1 tablet (5 mg total) by mouth 2 (two) times daily. 60 tablet 11   benzonatate (TESSALON) 200 MG capsule Take 1 capsule (200 mg total) by mouth 3 (three) times daily. 20 capsule 0   Calcium Carbonate (CALCIUM 600 PO) Take 600 mg by mouth daily.     cetirizine (ZYRTEC) 10 MG tablet Take 10 mg by mouth daily.      Cholecalciferol (VITAMIN D) 50 MCG (2000 UT) tablet Take 2,000 Units by mouth 2 (two) times daily.     Cyanocobalamin (VITAMIN B-12 PO) Take 3,000 mcg by mouth daily.      dexamethasone (DECADRON) 4 MG tablet Take 2 tablets by mouth once a day for 3 days after chemo. Take with food. 30 tablet 1   fluticasone (FLONASE) 50 MCG/ACT nasal  spray Place 1 spray into both nostrils daily. 16 g 1   fluticasone furoate-vilanterol (BREO ELLIPTA) 100-25 MCG/INH AEPB Inhale 1 puff into the lungs daily. 60 each 0   hydrOXYzine (ATARAX/VISTARIL) 25 MG tablet Take 1 tablet (25 mg total) by mouth at bedtime as needed for itching. 30 tablet 0   levothyroxine (SYNTHROID) 25 MCG tablet Take 25 mcg by mouth daily before breakfast.      lidocaine-prilocaine (EMLA) cream Apply to affected area once 30 g 3   Multiple Vitamins-Minerals (PRESERVISION AREDS PO) Take 1 capsule by mouth 2 (two) times daily.      olopatadine (PATANOL) 0.1 % ophthalmic solution Place 1 drop into both eyes 2 (two) times daily.     ondansetron (ZOFRAN) 8 MG tablet Take 1 tablet (8 mg total) by mouth 2 (two) times daily as needed. Start on the third day after chemotherapy. 30 tablet 1   pantoprazole (PROTONIX) 40 MG tablet Take 1 tablet (40 mg total) by mouth daily. 90 tablet 1   triamcinolone cream (KENALOG) 0.1 % Apply 1 application topically 3 (three) times daily.      vitamin E 400 UNIT capsule Take 400 Units by mouth daily.     zolpidem (AMBIEN) 10 MG tablet Take 2.5 mg by mouth at bedtime.      prochlorperazine (COMPAZINE) 10 MG tablet Take 1 tablet (10 mg total) by mouth every 6 (six) hours as needed (Nausea or vomiting). 30 tablet 1   apixaban (ELIQUIS) 5 MG TABS tablet Take 2 tablets (38m) twice daily for 7 days, then 1 tablet (573m twice daily 60 tablet 0   bacitracin ointment Apply 1 application topically every 8 (eight) hours. (Patient not taking: Reported on 04/14/2019) 120 g 0   Krill  Oil (OMEGA-3) 500 MG CAPS Take 500 mg by mouth daily.     predniSONE (DELTASONE) 20 MG tablet Take 2 tablets (40 mg total) by mouth daily with breakfast for 7 days. 14 tablet 0   No facility-administered medications prior to visit.     Review of Systems  Constitutional: Positive for chills and malaise/fatigue. Negative for diaphoresis, fever and weight loss.    HENT: Positive for congestion. Negative for ear pain and sore throat.   Eyes:       Right subconjunctival hemorrhage  Respiratory: Positive for cough. Negative for hemoptysis, sputum production, shortness of breath and wheezing.        Cough improved  Cardiovascular: Positive for leg swelling. Negative for chest pain and palpitations.  Gastrointestinal: Positive for nausea. Negative for abdominal pain and heartburn.       Reduced appetite  Genitourinary: Negative for frequency.  Musculoskeletal: Negative for joint pain and myalgias.  Skin: Positive for itching and rash.  Neurological: Positive for weakness. Negative for dizziness and headaches.  Endo/Heme/Allergies: Bruises/bleeds easily.  Psychiatric/Behavioral: Negative for depression. The patient is not nervous/anxious.      Objective:   Vitals:   04/14/19 1346  BP: (!) 118/56  Pulse: 80  Temp: 98 F (36.7 C)  TempSrc: Temporal  SpO2: 97%  Weight: 111 lb 9.6 oz (50.6 kg)  Height: _0  (1.626 m)   97% on  RA BMI Readings from Last 3 Encounters:  04/14/19 19.16 kg/m  03/27/19 20.60 kg/m  03/17/19 20.66 kg/m   Wt Readings from Last 3 Encounters:  04/14/19 111 lb 9.6 oz (50.6 kg)  03/27/19 120 lb (54.4 kg)  03/17/19 120 lb 5.9 oz (54.6 kg)    Physical Exam Vitals signs reviewed.  Constitutional:      Appearance: Normal appearance. She is not diaphoretic.  HENT:     Head: Normocephalic and atraumatic.     Nose:     Comments: Deferred due to masking requirement.    Mouth/Throat:     Comments: Deferred due to masking requirement. Eyes:     General: No scleral icterus. Neck:     Musculoskeletal: Neck supple.  Cardiovascular:     Rate and Rhythm: Normal rate and regular rhythm.     Heart sounds: No murmur.  Pulmonary:     Comments: Breathing comfortably on room air, no conversational dyspnea.  Reduced breath sounds in the lower right hemithorax. Abdominal:     General: There is no distension.      Palpations: Abdomen is soft.     Tenderness: There is no abdominal tenderness.  Musculoskeletal:        General: No deformity.     Comments: pitting edema bilaterally  Lymphadenopathy:     Cervical: Cervical adenopathy present.  Skin:    General: Skin is warm and dry.     Findings: No bruising.     Comments: Bandages on her ankles  Neurological:     General: No focal deficit present.     Mental Status: She is alert.     Coordination: Coordination normal.  Psychiatric:        Mood and Affect: Mood normal.        Behavior: Behavior normal.      CBC    Component Value Date/Time   WBC 8.5 03/27/2019 1305   RBC 4.75 03/27/2019 1305   HGB 13.5 03/27/2019 1305   HGB 15.2 07/25/2016 1053   HCT 42.6 03/27/2019 1305   HCT 44.9 07/25/2016 1053  PLT 211 03/27/2019 1305   PLT 176 07/25/2016 1053   MCV 89.7 03/27/2019 1305   MCV 88.5 07/25/2016 1053   MCH 28.4 03/27/2019 1305   MCHC 31.7 03/27/2019 1305   RDW 15.4 03/27/2019 1305   RDW 13.7 07/25/2016 1053   LYMPHSABS 0.8 03/27/2019 1305   LYMPHSABS 0.9 07/25/2016 1053   MONOABS 0.7 03/27/2019 1305   MONOABS 0.4 07/25/2016 1053   EOSABS 1.5 (H) 03/27/2019 1305   EOSABS 0.1 07/25/2016 1053   BASOSABS 0.1 03/27/2019 1305   BASOSABS 0.1 07/25/2016 1053    CHEMISTRY No results for input(s): NA, K, CL, CO2, GLUCOSE, BUN, CREATININE, CALCIUM, MG, PHOS in the last 168 hours.   Pleural fluid Cell count with differential-825 WBCs, 51% PMNs, 44% lymphs, 4% monocytes, 1% eosinophils Protein 3.5 (Serum 6.0) pH 7.9 LDH 107 (Serum 211) Glucose 107 Culture- NG Flow cytometry not performed as fluid appeared more inflammatory than like lymphoma per Path  RF <10 Anti-CCP 7 ANA negative IgE 406  Chest Imaging- films reviewed: CTA chest 03/06/2019-PE in the left lower lobe and right PA extending into the lower and upper lobe segmental pulmonary arteries.  Moderate size right dependent pleural effusion with overlying compressive  atelectasis and smaller left dependent pleural effusion.  Minimal mediastinal adenopathy, multiple subcentimeter axillary nodes bilaterally.  Subpleural anterior opacity on the right, mild groundglass opacities in the right middle lobe.  CXR 03/06/2019-minimal residual right pleural effusion, small left dependent pleural effusion.  Full re-expansion of the right lung post thoracentesis.  No obvious pulmonary masses or opacities.  Previous right breast surgical clips.  CXR, 2 view 03/13/2019-increasing right dependent pleural effusion, stable left pleural effusion  CT PET 04/08/2019-moderate right dependent pleural effusion, PET avid adenopathy in neck, chest, axilla, abdomen, and pelvis.  Pulmonary Functions Testing Results: No flowsheet data found.  Pathology:  Pleural fluid 03/06/2019-no malignant cells, but inflammatory cells present  Cervical & left thigh lymph nodes 03/17/2019: Angioimmunoblastic T-cell lymphoma  Echocardiogram 03/06/2019: LVEF 60 to 44%, diastolic dysfunction.  Moderately enlarged RV with mildly reduced systolic function.  Mildly dilated RA, normal LA.  Trivial pericardial effusion.  Trace MR, severe TR.  Mildly elevated PASP.  LE ultrasound 03/07/2019- LLE DVT and posterior tibial veins, gastrocnemius veins.  Enlarged groin lymph nodes.     Assessment & Plan:     ICD-10-CM   1. Other acute pulmonary embolism with acute cor pulmonale (HCC)  I26.09   2. 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  It is unpredictable when she may become symptomatic from this, but eventually she well as it increases.  Discussed the Pleurx is likely the best option given her need for chronic  anticoagulation, and concern for thrombocytopenia with chemo, both of which can limit the safety of performing repeated thoracenteses.  They wish to proceed with Pleurx, hopefully next week in conjunction with port placement -Referred for port placement per oncology; order not placed, would prefer to perform procedures on the same day to limit anticoagulation interruption.  Will need to be n.p.o. other than meds with sips for the procedures.  Risks, benefits, alternative of Pleurx were discussed. -Consult to IR placed for pleurX to be done concurrently with port  Submassive PE, DVT in LLE -provoked by lymphoma.  Resolution  of right heart strain on follow-up echo. -Continue Eliquis 5 mg twice daily for the duration of her malignancy, minimum of 6 months. -When interruption is required for procedures, recommend stopping Eliquis 2 days prior to the procedure with resumption of Lovenox twice daily.  The morning of the procedure hold Lovenox. -Okay to stop Breo, unsure why this was originally started   RTC in 4 weeks after Pleurx placement.      Current Outpatient Medications:    amLODipine (NORVASC) 2.5 MG tablet, Take 2.5-5 mg by mouth See admin instructions. 5 mg in the morning, 2.5 mg in the evening, Disp: , Rfl:    apixaban (ELIQUIS) 5 MG TABS tablet, Take 1 tablet (5 mg total) by mouth 2 (two) times daily., Disp: 60 tablet, Rfl: 11   benzonatate (TESSALON) 200 MG capsule, Take 1 capsule (200 mg total) by mouth 3 (three) times daily., Disp: 20 capsule, Rfl: 0   Calcium Carbonate (CALCIUM 600 PO), Take 600 mg by mouth daily., Disp: , Rfl:    cetirizine (ZYRTEC) 10 MG tablet, Take 10 mg by mouth daily. , Disp: , Rfl:    Cholecalciferol (VITAMIN D) 50 MCG (2000 UT) tablet, Take 2,000 Units by mouth 2 (two) times daily., Disp: , Rfl:    Cyanocobalamin (VITAMIN B-12 PO), Take 3,000 mcg by mouth daily. , Disp: , Rfl:    dexamethasone (DECADRON) 4 MG tablet, Take 2 tablets by mouth once a day  for 3 days after chemo. Take with food., Disp: 30 tablet, Rfl: 1   fluticasone (FLONASE) 50 MCG/ACT nasal spray, Place 1 spray into both nostrils daily., Disp: 16 g, Rfl: 1   fluticasone furoate-vilanterol (BREO ELLIPTA) 100-25 MCG/INH AEPB, Inhale 1 puff into the lungs daily., Disp: 60 each, Rfl: 0   hydrOXYzine (ATARAX/VISTARIL) 25 MG tablet, Take 1 tablet (25 mg total) by mouth at bedtime as needed for itching., Disp: 30 tablet, Rfl: 0   levothyroxine (SYNTHROID) 25 MCG tablet, Take 25 mcg by mouth daily before breakfast. , Disp: , Rfl:    lidocaine-prilocaine (EMLA) cream, Apply to affected area once, Disp: 30 g, Rfl: 3   Multiple Vitamins-Minerals (PRESERVISION AREDS PO), Take 1 capsule by mouth 2 (two) times daily. , Disp: , Rfl:    olopatadine (PATANOL) 0.1 % ophthalmic solution, Place 1 drop into both eyes 2 (two) times daily., Disp: , Rfl:    ondansetron (ZOFRAN) 8 MG tablet, Take 1 tablet (8 mg total) by mouth 2 (two) times daily as needed. Start on the third day after chemotherapy., Disp: 30 tablet, Rfl: 1   pantoprazole (PROTONIX) 40 MG tablet, Take 1 tablet (40 mg total) by mouth daily., Disp: 90 tablet, Rfl: 1   triamcinolone cream (KENALOG) 0.1 %, Apply 1 application topically 3 (three) times daily. , Disp: , Rfl:    vitamin E 400 UNIT capsule, Take 400 Units by mouth daily., Disp: , Rfl:    zolpidem (AMBIEN) 10 MG tablet, Take 2.5 mg by mouth at bedtime. , Disp: , Rfl:    prochlorperazine (COMPAZINE) 10 MG tablet, Take 1 tablet (10 mg total) by mouth every 6 (six) hours as needed (Nausea or vomiting)., Disp: 30 tablet, Rfl: 1   Julian Hy, DO [facility] 04/14/2019 4:03 PM

## 2019-04-14 NOTE — Progress Notes (Signed)
Patient on plan of care prior to pathways. 

## 2019-04-14 NOTE — Progress Notes (Signed)
START ON PATHWAY REGIMEN - Lymphoma and CLL     A cycle is every 21 days:     Cyclophosphamide      Doxorubicin      Prednisone      Brentuximab vedotin   **Always confirm dose/schedule in your pharmacy ordering system**  Patient Characteristics: T-Cell Lymphoma, First Line, AITL (Angioimmunoblastic T-Cell Lymphoma) or ALCL (Anaplastic Large Cell Lymphoma) or Peripheral T-Cell, NOS, CD30 Positive Disease Type: Not Applicable Disease Type: T-Cell Lymphoma Disease Type: Not Applicable Line of Therapy: First Line [place] Stage: IV T-Cell Lymphoma Subtype: AITL (Angioimmunoblastic T-Cell Lymphoma) CD30 Status: CD30 Positive Intent of Therapy: Curative Intent, Discussed with Patient

## 2019-04-14 NOTE — Telephone Encounter (Signed)
Patient daughter Sarah [name] called: Patient would like to have port placed asap and set up patient ed classes for chemo discussed with Dr.Kale in appt on 11/19. [name] that MD not in office this week and that message will be sent to him. [name] verbalized understanding.  Inbasket msg sent for MD to place orders for these.

## 2019-04-14 NOTE — Patient Instructions (Addendum)
Thank you for visiting Dr. Carlis Abbott at [facility]. We recommend the following: Orders Placed This Encounter  Procedures  . Ambulatory referral to Interventional Radiology   Orders Placed This Encounter  Procedures  . Ambulatory referral to Interventional Radiology    Referral Priority:   Routine    Referral Type:   Consultation    Referral Reason:   Specialty Services Required    Requested Specialty:   Interventional Radiology    Number of Visits Requested:   1    No orders of the defined types were placed in this encounter.    Okay to stop Breo.  Before PleurX catheter and port-a-cath placement, stop Eliquis 2 days before and use Lovenox twice daily instead. The morning of the procedures, do not take your Lovenox. You will not be able to eat or drink before your procedure other than medications.  [email or web address]  Return in about 4 weeks (around 05/12/2019).    Please do your part to reduce the spread of COVID-19.

## 2019-04-15 ENCOUNTER — Telehealth: Payer: Self-pay | Admitting: Critical Care Medicine

## 2019-04-15 ENCOUNTER — Other Ambulatory Visit (HOSPITAL_COMMUNITY): Payer: Self-pay | Admitting: Critical Care Medicine

## 2019-04-15 DIAGNOSIS — J910 Malignant pleural effusion: Secondary | ICD-10-CM

## 2019-04-15 NOTE — Addendum Note (Signed)
Addended by: Julian Hy on: 04/15/2019 11:50 AM   Modules accepted: Orders

## 2019-04-15 NOTE — Telephone Encounter (Signed)
I spoke to Sarah [name]'s daughter Sarah [name] to let her know that interventional radiology has approved to do the Pleurx drain at the same time as her port Wednesday, 12/2.  She will continue her Eliquis through Sunday, and Monday and Tuesday use Lovenox shots twice daily.  She will hold her Lovenox on Wednesday morning before the procedure.  Unless radiology objects, she can resume Lovenox on the evening of 12/2.  Home health orders have been placed.  Pleurx bottle order form has been completed-1 L bottles, 3 days/week drainage for an indefinite duration.  She should follow up with [place] in clinic within a few days of drain placement.  Julian Hy, DO 04/15/19 6:06 PM [facility]

## 2019-04-15 NOTE — Telephone Encounter (Signed)
Spoke with pt's daughter and she wanted Dr. Carlis Abbott to know when the appt was to have the port placed which is scheduled for 04/23/2019. She is going to have the procedure done at [place] and her arrival time is at 1pm. Dr. Carlis Abbott can you advise on how we could arrange a drain to be placed at the same time she is having the port placed?  Please send message back to [facility] triage pool.    Okay to stop Breo.  Before PleurX catheter and port-a-cath placement, stop Eliquis 2 days before and use Lovenox twice daily instead. The morning of the procedures, do not take your Lovenox. You will not be able to eat or drink before your procedure other than medications.  [email or web address]  Return in about 4 weeks (around 05/12/2019).

## 2019-04-15 NOTE — Addendum Note (Signed)
Addended by: Julian Hy on: 04/15/2019 11:55 AM   Modules accepted: Orders

## 2019-04-16 ENCOUNTER — Telehealth: Payer: Self-pay | Admitting: Hematology

## 2019-04-16 NOTE — Telephone Encounter (Signed)
LMTCB for Patient's daughter [name].

## 2019-04-16 NOTE — Telephone Encounter (Signed)
Scheduled appt per 11/23 sch message - pt daughter is aware of appt date and time

## 2019-04-18 ENCOUNTER — Other Ambulatory Visit (HOSPITAL_COMMUNITY): Admission: RE | Admit: 2019-04-18 | Payer: Medicare Other | Source: Ambulatory Visit

## 2019-04-18 ENCOUNTER — Inpatient Hospital Stay: Payer: Medicare Other

## 2019-04-18 ENCOUNTER — Other Ambulatory Visit: Payer: Self-pay

## 2019-04-19 ENCOUNTER — Other Ambulatory Visit (HOSPITAL_COMMUNITY)
Admission: RE | Admit: 2019-04-19 | Discharge: 2019-04-19 | Disposition: A | Discharge status: Home / Self Care | Payer: Medicare Other | Source: Ambulatory Visit | Attending: Hematology | Admitting: Hematology

## 2019-04-19 DIAGNOSIS — Z01812 Encounter for preprocedural laboratory examination: Secondary | ICD-10-CM | POA: Diagnosis not present

## 2019-04-19 DIAGNOSIS — Z20828 Contact with and (suspected) exposure to other viral communicable diseases: Secondary | ICD-10-CM | POA: Diagnosis not present

## 2019-04-20 LAB — NOVEL CORONAVIRUS, NAA (HOSP ORDER, SEND-OUT TO REF LAB; TAT 18-24 HRS): SARS-CoV-2, NAA: NOT DETECTED

## 2019-04-21 ENCOUNTER — Inpatient Hospital Stay: Payer: Medicare Other

## 2019-04-21 ENCOUNTER — Other Ambulatory Visit: Payer: Self-pay

## 2019-04-21 ENCOUNTER — Inpatient Hospital Stay (HOSPITAL_BASED_OUTPATIENT_CLINIC_OR_DEPARTMENT_OTHER): Payer: Medicare Other | Admitting: Hematology

## 2019-04-21 ENCOUNTER — Telehealth: Payer: Self-pay | Admitting: Critical Care Medicine

## 2019-04-21 VITALS — BP 137/73 | HR 78 | Temp 98.9°F | Resp 17 | Ht 64.0 in | Wt 111.7 lb

## 2019-04-21 DIAGNOSIS — R591 Generalized enlarged lymph nodes: Secondary | ICD-10-CM | POA: Diagnosis not present

## 2019-04-21 DIAGNOSIS — Z7189 Other specified counseling: Secondary | ICD-10-CM

## 2019-04-21 DIAGNOSIS — C8440 Peripheral T-cell lymphoma, not classified, unspecified site: Secondary | ICD-10-CM

## 2019-04-21 DIAGNOSIS — I2699 Other pulmonary embolism without acute cor pulmonale: Secondary | ICD-10-CM | POA: Diagnosis not present

## 2019-04-21 DIAGNOSIS — Z853 Personal history of malignant neoplasm of breast: Secondary | ICD-10-CM | POA: Diagnosis not present

## 2019-04-21 DIAGNOSIS — Z170 Estrogen receptor positive status [ER+]: Secondary | ICD-10-CM

## 2019-04-21 DIAGNOSIS — C50211 Malignant neoplasm of upper-inner quadrant of right female breast: Secondary | ICD-10-CM

## 2019-04-21 LAB — COMPREHENSIVE METABOLIC PANEL
ALT: 18 U/L (ref 0–44)
AST: 14 U/L — ABNORMAL LOW (ref 15–41)
Albumin: 3.5 g/dL (ref 3.5–5.0)
Alkaline Phosphatase: 84 U/L (ref 38–126)
Anion gap: 11 (ref 5–15)
BUN: 30 mg/dL — ABNORMAL HIGH (ref 8–23)
CO2: 30 mmol/L (ref 22–32)
Calcium: 10.5 mg/dL — ABNORMAL HIGH (ref 8.9–10.3)
Chloride: 102 mmol/L (ref 98–111)
Creatinine, Ser: 0.83 mg/dL (ref 0.44–1.00)
GFR calc Af Amer: >60 mL/min (ref >60)
GFR calc non Af Amer: >60 mL/min (ref >60)
Glucose, Bld: 112 mg/dL — ABNORMAL HIGH (ref 70–99)
Potassium: 3.8 mmol/L (ref 3.5–5.1)
Sodium: 143 mmol/L (ref 135–145)
Total Bilirubin: 0.3 mg/dL (ref 0.3–1.2)
Total Protein: 6.8 g/dL (ref 6.5–8.1)

## 2019-04-21 LAB — CBC WITH DIFFERENTIAL/PLATELET
Abs Immature Granulocytes: 0.03 10*3/uL (ref 0.00–0.07)
Basophils Absolute: 0.1 10*3/uL (ref 0.0–0.1)
Basophils Relative: 1 %
Eosinophils Absolute: 0.7 10*3/uL — ABNORMAL HIGH (ref 0.0–0.5)
Eosinophils Relative: 11 %
HCT: 41.5 % (ref 36.0–46.0)
Hemoglobin: 13.1 g/dL (ref 12.0–15.0)
Immature Granulocytes: 1 %
Lymphocytes Relative: 7 %
Lymphs Abs: 0.5 10*3/uL — ABNORMAL LOW (ref 0.7–4.0)
MCH: 28.1 pg (ref 26.0–34.0)
MCHC: 31.6 g/dL (ref 30.0–36.0)
MCV: 88.9 fL (ref 80.0–100.0)
Monocytes Absolute: 0.7 10*3/uL (ref 0.1–1.0)
Monocytes Relative: 10 %
Neutro Abs: 4.5 10*3/uL (ref 1.7–7.7)
Neutrophils Relative %: 70 %
Platelets: 210 10*3/uL (ref 150–400)
RBC: 4.67 MIL/uL (ref 3.87–5.11)
RDW: 14.7 % (ref 11.5–15.5)
WBC: 6.4 10*3/uL (ref 4.0–10.5)
nRBC: 0 % (ref 0.0–0.2)

## 2019-04-21 NOTE — Telephone Encounter (Signed)
Called Sarah [name] but she did not answer. Her VM is not setup.

## 2019-04-21 NOTE — Progress Notes (Signed)
HEMATOLOGY/ONCOLOGY CLINIC NOTE  Date of Service: 04/21/2019  Patient Care Team: Sarah Manes, MD as PCP - General (Internal Medicine) Sarah Dresser, MD as PCP - Cardiology (Cardiology)  REFERRING PHYSICIAN: Lajean Manes, MD  CHIEF COMPLAINTS/PURPOSE OF CONSULTATION:  Newly Diagnosed Angioimmunoblastic T cell lymphoma      HISTORY OF PRESENTING ILLNESS:   Sarah [name] is a wonderful 85 y.o. female who has been referred to [place] by Sarah Manes, MD for evaluation and management of Possible lymphoma . She is accompanied today by her daughter Sarah [name] . The pt reports that she is doing well overall.   Pending Surgical pathology from 03/17/2019 Sarah [name] will contact clinic about pathology   August 25th/27th she began coughing and sneezing. On Sept 3 went to doctors and they said it was allergies. She began having rashes (little red dots). Went to PCP and they prescribed prednisone and for several days and the medication helped. But as soon as she completed the prescription symptoms reoccurred.   She had a chest X Ray done because she was having a severe cough on 02/17/2019 revealing "bilateral effusions in this patient with history of breast cancer,of uncertain significance with question of right lower lobe pulmonary nodule, consider chest CT for further assessment. Atherosclerotic changes in the thoracic aorta."  Sarah [name] biopsied the rashes on her back 02/21/2019 and it was determined that it could be because of an allergy to medication. Sarah [name] stopped her blood pressure medication from September- October but ther rash still present. The itching started 1 to 2 months ago. The rash would come and go and was mostly on her back. She still has rash on her left thigh.  She is using triaconazole and cetrizine.  She has lumps behind ear and abdomin and she states that they have swollen twice the size within 1 and half months  She has no appetite and has changes in  her taste that began around August. She has changes in bowels. They are more loose and smaller In size. She also has no control over bowels.  She feels fatigued and it has increased since August.   She has a cough that starts in September  that has made her horse.   She had a CT scan on 02/27/2019 because she was having severe cough and her stomach was very extended. Revealing "1. Extensive lymphadenopathy throughout the chest, abdomen, and pelvis, with index lymph nodes identified above. Splenomegaly. Findings are most consistent with lymphoma with recurrent, metastatic breast malignancy less favored. 2. There is generally osteopenic appearance of the skeleton. There are numerous subtle hypodense lesions, particularly of the pelvis (e.g. Series 2, image 88) and select vertebral bodies, for example L4 (series 8, image 92). This is suspicious although not definitive for osseous metastatic disease. Characterization for metabolic activity by nuclear scintigraphic bone scan or PET-CT would be helpful to further evaluate. 3. Moderate right, small left pleural effusions with associated atelectasis or consolidation. Subpleural radiation fibrosis of the anterior right lung. 4. There is a new subpleural pulmonary nodule of the anterior right middle lobe measuring 6 mm (series 4, image 105), nonspecific. There is no obvious pleural thickening or nodularity definitive for metastatic disease. 5.  Small volume ascites. 6. There is a fluid attenuation lesion of the pancreatic uncinate measuring 2.4 x 1.3 cm (series 2, image 63), not substantially changed when compared to remote prior MR examination dated 02/23/2012 and likely sequelae of a prior pseudocyst versus incidental pancreatic IPMN. 7.  Cardiomegaly."  Hospitalized from 03/06/2019-03/08/2019. She presenting with acute shortness of breath, leg swelling,left hand swelling and some weight gain despite poor appetite. In ED, CTA chest revealed at least submassive PE,  moderate right pleural effusion, diffuse LAD concerning for lymphoma. Started on heparin drip. PCCM consulted for guidance. She had thoracocentesis with removal of 1200 cc cloudy exudative pleural fluid. Cardiology consulted for elevated which was thought to be demand ischemia from right heart strain in the setting of PE. Patient was transitioned to subcu Lovenox by pulmonology the next day and remained stable. Unusual appearance of tracheal air column at the thoracic inlet. Correlate with any history of swallowing difficulty or changes in voice with dedicated imaging with chest CT and or CT of the neck as indicated.  She has a nonhealing ulcer on her right tibialis anterior that starting weeping one week ago. Of note since the patient's last visit, pt has had CHEST XRAY - 2 VIEW (Accession [id number]) completed on 02/17/2019 with results revealing "Bilateral effusions in this patient with history of breast cancer,of uncertain significance with question of right lower lobepulmonary nodule, consider chest CT for furtherassessment.Unusual appearance of tracheal air column at the thoracic inlet.Correlate with any history of swallowing difficulty or changes in voice with dedicated imaging with chest CT and or CT of the neck as Indicated. Atherosclerotic changes in the thoracic aorta."  Of note since the patient's last visit, pt has had CT CHEST, ABDOMEN, AND PELVIS WITH CONTRAST (Accession [id number]) completed on 02/27/2019 with results revealing "1. Extensive lymphadenopathy throughout the chest, abdomen, and pelvis, with index lymph nodes identified above. Splenomegaly.Findings are most consistent with lymphoma with recurrent, metastatic breast malignancy less favored. 2. There is generally osteopenic appearance of the skeleton. There are numerous subtle hypodense lesions, particularly of the pelvis (e.g. Series 2, image 88) and select vertebral bodies, for example L4 (series 8, image 92). This is  suspicious although not definitive for osseous metastatic disease. Characterization for metabolic activity by nuclear scintigraphic bone scan or PET-CT would be helpful to further evaluate.  3. Moderate right, small left pleural effusions with associated atelectasis or consolidation. Subpleural radiation fibrosis of the anterior right lung.4. There is a new subpleural pulmonary nodule of the anterior right middle lobe measuring 6 mm (series 4, image 105), nonspecific. There is no obvious pleural thickening or nodularity definitive for metastatic disease.5.  Small volume ascites. 6. There is a fluid attenuation lesion of the pancreatic uncinate measuring 2.4 x 1.3 cm (series 2, image 63), not substantially changed when compared to remote prior MR examination dated 02/23/2012 and likely sequelae of a prior pseudocyst versus incidental pancreatic IPMN.7.  Cardiomegaly.8. Other chronic and incidental findings as detailed above. Aortic Atherosclerosis (ICD10-I70.0)."  Of note since the patient's last visit, pt has had PORTABLE CHEST 1 VIEW (Accession [id number]) completed on 03/06/2019 with results revealing "1. Moderate size right and small left pleural effusions appear not significantly changed since 02/27/19. 2. No new cardiopulmonary abnormality."  Of note since the patient's last visit, pt has had ECHOCARDIOGRAM  completed on 03/06/2019 with results revealing  "1. Left ventricular ejection fraction, by visual estimation, is 60 to 65%. The left ventricle has normal function. Normal left ventricular size. There is no left ventricular hypertrophy. 2. Left ventricular diastolic Doppler parameters are consistent with impaired relaxation pattern of LV diastolic filling. 3. Global right ventricle has mildly reduced systolic function.The right ventricular size is moderately enlarged. No increase in right ventricular wall thickness. 4. Left atrial size was normal. 5. Right  atrial size was mildly dilated. 6.  Trivial pericardial effusion is present. 7. The mitral valve is normal in structure. Trace mitral valve regurgitation. No evidence of mitral stenosis. 8. The tricuspid valve is normal in structure. Tricuspid valve regurgitation severe. 9. The aortic valve is normal in structure. Aortic valve regurgitation was not visualized by color flow Doppler. Structurally normal aortic valve, with no evidence of sclerosis or stenosis.10. The pulmonic valve was normal in structure. Pulmonic valve regurgitation is mild by color flow Doppler.11. Mildly elevated pulmonary artery systolic pressure.12. The inferior vena cava is normal in size with greater than 50% respiratory variability, suggesting right atrial pressure of 3 mmHg."  Of note since the patient's last visit, pt has had CT ANGIOGRAPHY CHEST WITH CONTRAST (Accession [id number]) completed on 03/06/2019 with results revealing "1. Pulmonary embolus arising from the distal left main pulmonary artery with extension into multiple left lower lobe pulmonary arterial branches. There is also incompletely obstructing pulmonary embolus in the proximal left upper lobe pulmonary artery. More distal pulmonary embolus in the right lower lobe pulmonary artery. Positive for acute PE with CT evidence of right heart strain (RV/LV Ratio = 1.6) consistent with at least submassive (intermediate risk) PE. The presence of right heart strain has been associated with an increased risk of morbidity and mortality. Please activate Code PE by paging [phone]. 2. Sizable pleural effusion on the right with smaller pleural effusion on the left. Consolidation and compressive atelectasis throughout most of the right lower lobe. Milder atelectasis left base. Nodular opacities on the right, likely metastatic foci.3.  Stable adenopathy compared to 1 week prior.4. Enlargement of spleen, incompletely visualized but documented CT 1 week prior. Concern for potential lymphoma. 5. Aortic atherosclerosis. No  aneurysm or dissection evident. Foci of coronary artery calcification noted."  Most recent lab results (03/06/2019) of CBC is as follows: all values are WNL except for Platelets at 72, Eosinophils Absolute at 0.6,Abs Immature Granulocytes at 0.08 .  On review of systems, pt reports rashes, digestive issues and denies back pain, abdominal pain and any other symptoms.   On PMHx the pt reports Hypothyroidism, MCI, Hypercholesterolemia, Tracheal anomaly, Hypertension, Pulmonary nodule, Atherosclerosis of aorta, anxiety, gait disorder, primary insomnia.   INTERVAL HISTORY:  Sarah [name] is a 85 y.o. female here for evaluation and management of Angioimmunoblastic T-cell lymphoma. We are joined today by her daughter. The patient's last visit with [place] was on 04/10/2019. The pt reports that she is doing well overall.  The pt reports that she has noticed some decrease in her vision. Pt has macular degeneration that is stable. She does not think that it is the cause of her worsening vision. Her Optometrist believes that she has severe dry eyes but has given her antibiotic eye drops to use 4x per day. She has an appointment with them tomorrow. Pt has been continuing to itch but notes that the itching does not always correlate with her rash. Her rash has been improving with the medication. Pt has used Sarna for her skin itch to no avail. She reports that her cough has also improved and she is no longer having to take [employer]. She has a secreting ulcer on her lower left leg and another is coming up as well. When the pt wakes in the middle of the night due to having nightmares she is experiencing severe dry mouth no matter how much water she drinks beforehand. Pt and her daughter have been through chemotherapy counseling. Pt is currently drinking 2  bottles of Ensure per day. She is interested in increasing this to 3 per day as she has noticed an increase in appetite.   Lab results today (04/21/19) of CBC  w/diff and CMP is as follows: all values are WNL except for Lymphs Abs at 0.5K, Eos Abs at 0.7K, Glucose at 112, BUN at 30, Calcium at 10.5, AST at 14.  On review of systems, pt reports worsening vision, itching, rash, improving cough, LLE ulcers, dry mouth, increased appetite, nightmares and denies fevers, chills, night sweats, SOB and any other symptoms.   MEDICAL HISTORY:  Past Medical History:  Diagnosis Date   Allergy    codeine, thiazides   Anxiety    new dx   Arthritis    Breast cancer ([facility]) 03/14/12   bx=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,ER/PR=+,upper inner quad   Cancer ([facility])    breast   Cataract    DVT (deep venous thrombosis) ([place]) 02/2019   left leg   Dyspnea    Edema    both legs feet and toe, abdomen   Glaucoma    laser treated years ago   HOH (hard of hearing)    Hyperlipemia    Hypertension    Hypothyroidism    Pancreatic cyst    benign   PONV (postoperative nausea and vomiting)    Pulmonary embolism ([place]) 02/2019   bilateral    Radiation 06/11/2012-07/12/2012   17 sessions 4250 cGy, 3 sessions 750 cGy   Vertigo    Wears glasses    Wears partial dentures    partial upper     SURGICAL HISTORY: Past Surgical History:  Procedure Laterality Date   ABDOMINAL HYSTERECTOMY  1966   1/2 ovary left in    [place]   lumpectomy-lt   CATARACT EXTRACTION     b/l   COLONOSCOPY     EXCISION MASS NECK Left 03/17/2019    EXCISION MASS NECK (Left Neck)   EXCISION MASS NECK Left 03/17/2019   Procedure: EXCISION MASS NECK;  Surgeon: Helayne Seminole, MD;  Location: [facility];  Service: ENT;  Laterality: Left;   EYE SURGERY Bilateral    bilateral cataract removal   JOINT REPLACEMENT  2013   rt total knee   JOINT REPLACEMENT  1995   lt total knee   PARTIAL MASTECTOMY WITH NEEDLE LOCALIZATION  04/09/2012   Procedure: PARTIAL MASTECTOMY WITH NEEDLE LOCALIZATION;  Surgeon: Adin Hector, MD;   Location: [facility];  Service: General;  Laterality: Right;   SKIN BIOPSY Left 03/17/2019   LEFT THIGH   SKIN BIOPSY Left 03/17/2019   Procedure: Skin Biopsy Left Thigh;  Surgeon: Helayne Seminole, MD;  Location: [place];  Service: ENT;  Laterality: Left;   TONSILLECTOMY     TOTAL KNEE ARTHROPLASTY  08/16/2011   Procedure: TOTAL KNEE ARTHROPLASTY;  Surgeon: Ninetta Lights, MD;  Location: [facility];  Service: Orthopedics;  Laterality: Right;     SOCIAL HISTORY: Social History   Socioeconomic History   Marital status: Widowed    Spouse name: Not on file   Number of children: 2   Years of education: Not on file   Highest education level: Not on file  Occupational History   Occupation: Retired    Comment: Interior and spatial designer strain: Not on file   Food insecurity    Worry: Not on file    Inability: Not on file   Transportation needs  Medical: Not on file    Non-medical: Not on file  Tobacco Use   Smoking status: Never Smoker   Smokeless tobacco: Never Used  Substance and Sexual Activity   Alcohol use: No   Drug use: No   Sexual activity: Not Currently  Lifestyle   Physical activity    Days per week: Not on file    Minutes per session: Not on file   Stress: Not on file  Relationships   Social connections    Talks on phone: Not on file    Gets together: Not on file    Attends religious service: Not on file    Active member of club or organization: Not on file    Attends meetings of clubs or organizations: Not on file    Relationship status: Not on file   Intimate partner violence    Fear of current or ex partner: Not on file    Emotionally abused: Not on file    Physically abused: Not on file    Forced sexual activity: Not on file  Other Topics Concern   Not on file  Social History Narrative   Not on file     FAMILY HISTORY: Family History  Problem Relation Age of Onset   Heart disease  Father    Cancer Maternal Aunt        stomach     ALLERGIES:   is allergic to codeine; latex; and thiazide-type diuretics.   MEDICATIONS:  Current Outpatient Medications  Medication Sig Dispense Refill   amLODipine (NORVASC) 2.5 MG tablet Take 2.5-5 mg by mouth See admin instructions. 5 mg in the morning, 2.5 mg in the evening     apixaban (ELIQUIS) 5 MG TABS tablet Take 1 tablet (5 mg total) by mouth 2 (two) times daily. 60 tablet 11   benzonatate (TESSALON) 200 MG capsule Take 1 capsule (200 mg total) by mouth 3 (three) times daily. 20 capsule 0   Calcium Carbonate (CALCIUM 600 PO) Take 600 mg by mouth daily.     cetirizine (ZYRTEC) 10 MG tablet Take 10 mg by mouth daily.      Cholecalciferol (VITAMIN D) 50 MCG (2000 UT) tablet Take 2,000 Units by mouth 2 (two) times daily.     Cyanocobalamin (VITAMIN B-12 PO) Take 3,000 mcg by mouth daily.      dexamethasone (DECADRON) 4 MG tablet Take 2 tablets by mouth once a day for 3 days after chemo. Take with food. 30 tablet 1   fluticasone (FLONASE) 50 MCG/ACT nasal spray Place 1 spray into both nostrils daily. 16 g 1   fluticasone furoate-vilanterol (BREO ELLIPTA) 100-25 MCG/INH AEPB Inhale 1 puff into the lungs daily. 60 each 0   hydrOXYzine (ATARAX/VISTARIL) 25 MG tablet Take 1 tablet (25 mg total) by mouth at bedtime as needed for itching. 30 tablet 0   levothyroxine (SYNTHROID) 25 MCG tablet Take 25 mcg by mouth daily before breakfast.      lidocaine-prilocaine (EMLA) cream Apply to affected area once 30 g 3   Multiple Vitamins-Minerals (PRESERVISION AREDS PO) Take 1 capsule by mouth 2 (two) times daily.      olopatadine (PATANOL) 0.1 % ophthalmic solution Place 1 drop into both eyes 2 (two) times daily.     ondansetron (ZOFRAN) 8 MG tablet Take 1 tablet (8 mg total) by mouth 2 (two) times daily as needed. Start on the third day after chemotherapy. 30 tablet 1   pantoprazole (PROTONIX) 40 MG tablet Take 1 tablet (40  mg  total) by mouth daily. 90 tablet 1   prochlorperazine (COMPAZINE) 10 MG tablet Take 1 tablet (10 mg total) by mouth every 6 (six) hours as needed (Nausea or vomiting). 30 tablet 1   triamcinolone cream (KENALOG) 0.1 % Apply 1 application topically 3 (three) times daily.      vitamin E 400 UNIT capsule Take 400 Units by mouth daily.     zolpidem (AMBIEN) 10 MG tablet Take 2.5 mg by mouth at bedtime.      No current facility-administered medications for this visit.      REVIEW OF SYSTEMS:   A 10+ POINT REVIEW OF SYSTEMS WAS OBTAINED including neurology, dermatology, psychiatry, cardiac, respiratory, lymph, extremities, GI, GU, Musculoskeletal, constitutional, breasts, reproductive, HEENT.  All pertinent positives are noted in the HPI.  All others are negative.   PHYSICAL EXAMINATION: Vitals:   04/21/19 1549  BP: 137/73  Pulse: 78  Resp: 17  Temp: 98.9 F (37.2 C)  SpO2: 100%   Wt Readings from Last 3 Encounters:  04/21/19 111 lb 11.2 oz (50.7 kg)  04/14/19 111 lb 9.6 oz (50.6 kg)  03/27/19 120 lb (54.4 kg)   Body mass index is 19.17 kg/m.    Exam was given in a chair   GENERAL:alert, in no acute distress and comfortable SKIN: no acute rashes, no significant lesions EYES: conjunctivitis* OROPHARYNX: MMM, no exudates, no oropharyngeal erythema or ulceration NECK: supple, no JVD LYMPH:  no palpable lymphadenopathy in the axillary or inguinal regions. Palpable cervical lymph nodes that are reduced in size from previous visit. LUNGS: clear to auscultation b/l with normal respiratory effort HEART: regular rate & rhythm ABDOMEN:  normoactive bowel sounds , non tender, not distended. No palpable hepatosplenomegaly.  Extremity: no pedal edema PSYCH: alert & oriented x 3 with fluent speech NEURO: no focal motor/sensory deficits  LABORATORY DATA:  I have reviewed the data as listed  CBC Latest Ref Rng & Units 04/21/2019 03/27/2019 03/07/2019  WBC 4.0 - 10.5 K/uL 6.4 8.5 8.4    Hemoglobin 12.0 - 15.0 g/dL 13.1 13.5 13.1  Hematocrit 36.0 - 46.0 % 41.5 42.6 39.5  Platelets 150 - 400 K/uL 210 211 214    CMP Latest Ref Rng & Units 04/21/2019 03/27/2019 03/08/2019  Glucose 70 - 99 mg/dL 112(H) 98 134(H)  BUN 8 - 23 mg/dL 30(H) 19 13  Creatinine 0.44 - 1.00 mg/dL 0.83 0.75 0.68  Sodium 135 - 145 mmol/L 143 142 139  Potassium 3.5 - 5.1 mmol/L 3.8 4.4 4.1  Chloride 98 - 111 mmol/L 102 105 109  CO2 22 - 32 mmol/L 30 26 21(L)  Calcium 8.9 - 10.3 mg/dL 10.5(H) 10.0 8.6(L)  Total Protein 6.5 - 8.1 g/dL 6.8 6.3(L) -  Total Bilirubin 0.3 - 1.2 mg/dL 0.3 0.5 -  Alkaline Phos 38 - 126 U/L 84 104 -  AST 15 - 41 U/L 14(L) 14(L) -  ALT 0 - 44 U/L 18 12 -   Component     Latest Ref Rng & Units 03/27/2019  Hepatitis B Surface Ag     NON REACTIVE NON REACTIVE  Hep B Core Total Ab     NON REACTIVE NON REACTIVE  HCV Ab     NON REACTIVE NON REACTIVE  LDH     98 - 192 U/L 252 (H)     04/08/2019 NM PET Image Initial (PI) Skull Base To Thigh (Accession [id number])  04/04/2019 PATHOLOGY   04/07/2019 ECHOCARDIOGRAM  03/28/2019 PATHOLOGY   03/06/2019 CT  ANGIOGRAPHY CHEST WITH CONTRAST (Accession [id number])  04/07/2019 ECHOCARDIOGRAM  03/06/2019 ECHOCARDIOGRAM     03/06/2019 PORTABLE CHEST 1 VIEW (Accession [id number])    02/27/2019 CT CHEST, ABDOMEN, AND PELVIS WITH CONTRAST (Accession [id number])    02/17/2019 CHEST XRAY - 2 VIEW (Accession [id number])    RADIOGRAPHIC STUDIES: I have personally reviewed the radiological images as listed and agreed with the findings in the report. Nm Pet Image Initial (pi) Skull Base To Thigh  Result Date: 04/08/2019 CLINICAL DATA:  Initial treatment strategy for large B-cell lymphoma. EXAM: NUCLEAR MEDICINE PET SKULL BASE TO THIGH TECHNIQUE: 6.0 mCi F-18 FDG was injected intravenously. Full-ring PET imaging was performed from the skull base to thigh after the radiotracer. CT data was obtained and used for attenuation  correction and anatomic localization. Fasting blood glucose: 86 mg/dl COMPARISON:  Multiple exams, including CT scans from 03/06/2019 and 02/27/2019 FINDINGS: Mediastinal blood pool activity: SUV max 1.8 Liver activity: SUV max 2.8 NECK: Posterior nasopharyngeal hypermetabolic activity, maximum SUV 7.6 on the left and 6.2 on the right. Lingual tonsillar activity maximum SUV 7.7 (Deauville 5). Hypermetabolic bilateral superficial parotid, level IIa, level IIb, level V, level Ia, level Ib, level III, and level IV hypermetabolic adenopathy noted. A left level IIa lymph node measuring 1.2 cm in short axis on image 28/4 has maximum SUV of 9.3, Deauville 5. An index left level Roman numeral 4 lymph node measuring 1.1 cm in short axis on image 38/4 has maximum SUV of 5.0, [place]ville 4. Bilateral thyroid activity is present, but is greater on the enlarged left thyroid lobe, maximum SUV of 6.2, De[place]ville 4. The right thyroid lobe has maximum SUV of 3.6, Deauville 4. Incidental CT findings: Bilateral common carotid atherosclerotic calcification. CHEST: Bilateral hypermetabolic axillary, supraclavicular, right paratracheal, left paratracheal, AP window, bilateral hilar, right infrahilar, and subcarinal adenopathy. Right paratracheal node 1.4 cm in short axis on image 53/4, maximum SUV 7.6, De[place]ville 5. Right hilar lymph node 1.1 cm in short axis on image 64/4, maximum SUV 7.2, [place]ville 5. Subcarinal lymph node approximately 1.3 cm in short axis on image 71/4, maximum SUV 6.1, [place] 4. Incidental CT findings: Large right and small left pleural effusions with passive atelectasis. Coronary, aortic arch, and branch vessel atherosclerotic vascular disease. Moderate cardiomegaly. Small pericardial effusion. Right lower lobe anterior subpleural nodule 0.6 cm at its base, image 85/4, not appreciably hypermetabolic, stable in size. This was previously reported on chest CT as right middle lobe but I believe that due to volume loss  this is actually in the right lower lobe. ABDOMEN/PELVIS: Hypermetabolic right gastric, gastrohepatic ligament, porta hepatis, peripancreatic, retroperitoneal, mesenteric, common iliac, external iliac, and inguinal adenopathy identified. Index peripancreatic node measuring 1.2 cm in short axis on image 103/4 has maximum SUV of 9.0, Deauville L5. Portacaval node measuring 2.0 cm in short axis on image 108/4 has a maximum SUV of 5.2, [place] 4. Conglomerate left periaortic adenopathy measuring 2.3 cm in short axis on image 116/4 has maximum SUV of 3.3, [place] 4. A left common iliac lymph node measuring 0.9 cm in short axis on image 133/4 has maximum SUV of 4.9, [place]ville 4. A left inguinal lymph node measuring 1.2 cm in short axis on image 165/4 has maximum SUV of 3.8, [place]ville 4. The spleen measures 11.4 by 7.0 by 9.1 cm (volume = 380 cm^3) and has metabolic uptake diffusely mildly higher than the liver, with the typical maximum SUV of 3.7, [place] 4. Incidental CT findings: Aortoiliac atherosclerotic vascular disease. Mild mesenteric edema.  Large left renal cyst. Fullness of both renal collecting systems. Punctate 1-2 mm left kidney lower pole calculi. Trace pelvic ascites. SKELETON: Minimal heterogeneity of marrow activity without definite focal hypermetabolic lesion identified. The subtle hypodensities in the vertebra and bony pelvis are not discernibly hypermetabolic. Incidental CT findings: Degenerative grade 1 anterolisthesis at L4-5 and L5-S1. Degenerative subcortical cyst formation in the anterior superior right acetabulum. IMPRESSION: 1. Widespread hypermetabolic adenopathy in the neck, chest, and abdomen/pelvis, primarily in the Deauville 4 and Deauville 5 range. There is also hypermetabolic activity along the posterior nasopharynx in lingual tonsillar regions potentially indicating sites of involvement. 2. The subtle hypodensities in the spine and bony pelvis are not appreciably hypermetabolic may  be incidental, surveillance is suggested. 3. Bilateral thyroid activity but especially on the left. Probably from thyroiditis. 4. A 6 mm subpleural nodule anteriorly in the right lower lobe not appreciably hypermetabolic but is below sensitive PET-CT size thresholds and merits surveillance. 5. Mildly accentuated diffuse splenic activity without splenomegaly or focal splenic lesion identified. 6. Other imaging findings of potential clinical significance: Aortic Atherosclerosis (ICD10-I70.0). Coronary atherosclerosis. Large right and small left pleural effusions. Moderate cardiomegaly. Small pericardial effusion. Mild mesenteric edema. Large left renal cyst. Fullness of both renal collecting systems with suspected nonobstructive left nephrolithiasis. Degenerative grade 1 anterolisthesis at L4-5 at L5-S1. Trace pelvic ascites. Electronically Signed   By: Van Clines M.D.   On: 04/08/2019 13:39     ASSESSMENT & PLAN:   KARMAN BISWELL is a 85 y.o. female with:  1. 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 SKIN, LEFT THIGH, BIOPSY: - Involvement by angioimmunoblastic T-cell lymphoma." -04/07/2019 Echocardiogram - normal EF -NM PET Image Initial (PI) Skull Base To Thigh (Accession [id number]) completed on 04/08/2019 with results revealing "1. Widespread hypermetabolic adenopathy in the neck, chest, and abdomen/pelvis, primarily in the Deauville 4 and Deauville 5 range. There is also hypermetabolic activity along the posterior nasopharynx in lingual tonsillar regions potentially indicating sites of involvement. 2. The subtle hypodensities in the spine and bony pelvis are not appreciably hypermetabolic may be incidental, surveillance is suggested. 3. Bilateral thyroid activity but especially on the left. Probably from thyroiditis. 4. A 6 mm subpleural nodule  anteriorly in the right lower lobe not appreciably hypermetabolic but is below sensitive PET-CT size thresholds and merits surveillance. 5. Mildly accentuated diffuse splenic activity without splenomegaly or focal splenic lesion identified. 6. Other imaging findings of potential clinical significance: Aortic Atherosclerosis (ICD10-I70.0). Coronary atherosclerosis. Large right and small left pleural effusions. Moderate cardiomegaly. Small pericardial effusion. Mild mesenteric edema. Large left renal cyst. Fullness of both renal collecting systems with suspected nonobstructive left nephrolithiasis. Degenerative grade 1 anterolisthesis at L4-5 at L5-S1. Trace pelvic ascites."   2. Pulmonary Embolism -extensive .likely related to extensive malignancy  3. H/o Breast Cancer  -The patient had bilateral diagnostic  mammography at [facility] 07/11/2011. This  showed some calcifications in the right  breast, which seemed a little bit more  prominent than prior. In the left breast  there was an area of possible  architectural distortion. However left  breast ultrasound the same day showed  no abnormality. 6 month followup was  suggested, and on 02/28/2012 the  patient again had bilateral diagnostic  mammography, now with right  ultrasonography. The microcalcifications  in the right breast appeared increased.  Ultrasound showed a hypoechoic lesion  measuring 5 mm, with no associated  shadowing.  This had been previously  noted and appeared unchanged. Anterior  to that there was a small hypoechoic  mass measuring 4 mm in diameter.  There was felt to be suspicious, and on  03/14/2012 the patient underwent biopsy  of the right breast mass, showing ([id number]  [id number]) ductal carcinoma in situ,  intermediate grade, estrogen receptor  100% and progesterone receptor 100%  positive.   -Bilateral breast MRI was obtained  03/26/2012. This showed only post  biopsy changes in the right breast,  associated with an area of non-masslike    enhancement measuring 2.4 cm. The  left breast was unremarkable, and there  was no enlarged axillary or internal  mammary adenopathy noted.  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 The patient's  subsequent history is as detailed below.   PLAN: -Discussed pt labwork today, 04/21/19; all values are WNL except for Lymphs Abs at 0.5K, Eos Abs at 0.7K, Glucose at 112, BUN at 30, Calcium at 10.5, AST at 14. -Advised pt that her leg ulcers could be venous stasis, immunologic phenomena or it could be due to her lymphoma -Will continue to monitor LLE ulcers  -Advised pt to take Hydroxyzine as needed and discontinue Flonase -Advised pt that it is okay to hold Calcium supplements at this time -Recommended that the pt continue to eat well, drink at least 48-64 oz of water each day, and walk 20-30 minutes each day. -Recommended pt keep showers/baths warm and not hot, pat dry and moisturize immediately after -Advised pt that there is about 50% chance of complete remission after a course of full-dose targeted therapy and chemotherapy however rx in her case will likely be limited by dosing limitations. -Will need to begin treatment with lower doses given pt age -Advised pt that we typically do 6 cycles of treatment (at least 2 after maximum response) and each cycle is 3 weeks -Will begin A-CHP on 04/24/2019 -Will send out testing on pleural effusion to see if there is are any abnormal T-cells present  -Plan for repeat PET/CT after 3 cycles of treatment  -Recommend pt continue to f/u with Optometrist for eye redness -Will see back in 2 weeks with labs    FOLLOW UP: Please schedule C1D3 on 12/5 for neulasta shot RTC with Dr Irene Limbo with labs in 2 weeks for toxicity check Plz schedule C2 of treatment as ordered on 05/14/2019 with labs and MD  visit  The total time spent in the appt was 40 minutes and more than 50% was on counseling and direct patient cares.  All of the patient's questions were answered with apparent satisfaction. The patient knows to call the clinic with any problems, questions or concerns.    Sullivan Lone MD [place] [facility] Hematology/Oncology Physician [facility]  (Office):       [phone] (Work cell):  [phone] (Fax):           [phone]  04/21/2019 5:25 PM  I, Yevette Edwards, am acting as a scribe for Dr. Sullivan Lone.   .I have reviewed the above documentation for accuracy and completeness, and I agree with the above. Brunetta Genera MD

## 2019-04-21 NOTE — Telephone Encounter (Signed)
Pt has appt 12.4. Nothing further is needed at this time.

## 2019-04-22 ENCOUNTER — Telehealth: Payer: Self-pay | Admitting: Hematology

## 2019-04-22 ENCOUNTER — Other Ambulatory Visit: Payer: Self-pay | Admitting: Radiology

## 2019-04-22 DIAGNOSIS — H40013 Open angle with borderline findings, low risk, bilateral: Secondary | ICD-10-CM | POA: Diagnosis not present

## 2019-04-22 DIAGNOSIS — H3554 Dystrophies primarily involving the retinal pigment epithelium: Secondary | ICD-10-CM | POA: Diagnosis not present

## 2019-04-22 DIAGNOSIS — H11423 Conjunctival edema, bilateral: Secondary | ICD-10-CM | POA: Diagnosis not present

## 2019-04-22 DIAGNOSIS — Z961 Presence of intraocular lens: Secondary | ICD-10-CM | POA: Diagnosis not present

## 2019-04-22 DIAGNOSIS — H16223 Keratoconjunctivitis sicca, not specified as Sjogren's, bilateral: Secondary | ICD-10-CM | POA: Diagnosis not present

## 2019-04-22 DIAGNOSIS — H18452 Nodular corneal degeneration, left eye: Secondary | ICD-10-CM | POA: Diagnosis not present

## 2019-04-22 NOTE — Telephone Encounter (Signed)
Scheduled appt per 11/30 los.  Left a vm of the appt date and time.

## 2019-04-22 NOTE — Telephone Encounter (Signed)
ATC, NA and no option to leave msg 

## 2019-04-22 NOTE — Telephone Encounter (Signed)
ATC, NA and VM not set up

## 2019-04-23 ENCOUNTER — Encounter (HOSPITAL_COMMUNITY): Payer: Self-pay

## 2019-04-23 ENCOUNTER — Other Ambulatory Visit: Payer: Self-pay

## 2019-04-23 ENCOUNTER — Ambulatory Visit (HOSPITAL_COMMUNITY)
Admission: RE | Admit: 2019-04-23 | Discharge: 2019-04-23 | Disposition: A | Discharge status: Home / Self Care | Payer: Medicare Other | Source: Ambulatory Visit | Attending: Hematology | Admitting: Hematology

## 2019-04-23 ENCOUNTER — Ambulatory Visit (HOSPITAL_COMMUNITY)
Admission: RE | Admit: 2019-04-23 | Discharge: 2019-04-23 | Disposition: A | Discharge status: Home / Self Care | Payer: Medicare Other | Source: Ambulatory Visit

## 2019-04-23 ENCOUNTER — Telehealth: Payer: Self-pay | Admitting: Critical Care Medicine

## 2019-04-23 ENCOUNTER — Ambulatory Visit (HOSPITAL_COMMUNITY)
Admission: RE | Admit: 2019-04-23 | Discharge: 2019-04-23 | Disposition: A | Discharge status: Home / Self Care | Payer: Medicare Other | Source: Ambulatory Visit | Attending: Critical Care Medicine | Admitting: Critical Care Medicine

## 2019-04-23 DIAGNOSIS — I10 Essential (primary) hypertension: Secondary | ICD-10-CM | POA: Diagnosis not present

## 2019-04-23 DIAGNOSIS — Z7901 Long term (current) use of anticoagulants: Secondary | ICD-10-CM | POA: Insufficient documentation

## 2019-04-23 DIAGNOSIS — C3490 Malignant neoplasm of unspecified part of unspecified bronchus or lung: Secondary | ICD-10-CM | POA: Insufficient documentation

## 2019-04-23 DIAGNOSIS — J90 Pleural effusion, not elsewhere classified: Secondary | ICD-10-CM

## 2019-04-23 DIAGNOSIS — E785 Hyperlipidemia, unspecified: Secondary | ICD-10-CM | POA: Insufficient documentation

## 2019-04-23 DIAGNOSIS — Z79890 Hormone replacement therapy (postmenopausal): Secondary | ICD-10-CM | POA: Diagnosis not present

## 2019-04-23 DIAGNOSIS — F419 Anxiety disorder, unspecified: Secondary | ICD-10-CM | POA: Insufficient documentation

## 2019-04-23 DIAGNOSIS — C8440 Peripheral T-cell lymphoma, not classified, unspecified site: Secondary | ICD-10-CM

## 2019-04-23 DIAGNOSIS — Z5111 Encounter for antineoplastic chemotherapy: Secondary | ICD-10-CM | POA: Diagnosis not present

## 2019-04-23 DIAGNOSIS — Z86711 Personal history of pulmonary embolism: Secondary | ICD-10-CM | POA: Insufficient documentation

## 2019-04-23 DIAGNOSIS — J910 Malignant pleural effusion: Secondary | ICD-10-CM

## 2019-04-23 DIAGNOSIS — E039 Hypothyroidism, unspecified: Secondary | ICD-10-CM | POA: Diagnosis not present

## 2019-04-23 DIAGNOSIS — Z79899 Other long term (current) drug therapy: Secondary | ICD-10-CM | POA: Insufficient documentation

## 2019-04-23 DIAGNOSIS — C865 Angioimmunoblastic T-cell lymphoma: Secondary | ICD-10-CM | POA: Diagnosis not present

## 2019-04-23 HISTORY — PX: IR IMAGING GUIDED PORT INSERTION: IMG5740

## 2019-04-23 LAB — CBC WITH DIFFERENTIAL/PLATELET
Abs Immature Granulocytes: 0.02 10*3/uL (ref 0.00–0.07)
Basophils Absolute: 0.1 10*3/uL (ref 0.0–0.1)
Basophils Relative: 1 %
Eosinophils Absolute: 0.5 10*3/uL (ref 0.0–0.5)
Eosinophils Relative: 7 %
HCT: 43.7 % (ref 36.0–46.0)
Hemoglobin: 13.7 g/dL (ref 12.0–15.0)
Immature Granulocytes: 0 %
Lymphocytes Relative: 7 %
Lymphs Abs: 0.5 10*3/uL — ABNORMAL LOW (ref 0.7–4.0)
MCH: 28.2 pg (ref 26.0–34.0)
MCHC: 31.4 g/dL (ref 30.0–36.0)
MCV: 89.9 fL (ref 80.0–100.0)
Monocytes Absolute: 0.8 10*3/uL (ref 0.1–1.0)
Monocytes Relative: 12 %
Neutro Abs: 5.1 10*3/uL (ref 1.7–7.7)
Neutrophils Relative %: 73 %
Platelets: 189 10*3/uL (ref 150–400)
RBC: 4.86 MIL/uL (ref 3.87–5.11)
RDW: 14.9 % (ref 11.5–15.5)
WBC: 7 10*3/uL (ref 4.0–10.5)
nRBC: 0 % (ref 0.0–0.2)

## 2019-04-23 LAB — PROTIME-INR
INR: 0.9 (ref 0.8–1.2)
Prothrombin Time: 12.4 seconds (ref 11.4–15.2)

## 2019-04-23 MED ORDER — HEPARIN SOD (PORK) LOCK FLUSH 100 UNIT/ML IV SOLN
INTRAVENOUS | Status: AC
  Filled 2019-04-23: qty 5

## 2019-04-23 MED ORDER — FENTANYL CITRATE (PF) 100 MCG/2ML IJ SOLN
INTRAMUSCULAR | Status: AC | PRN
  Administered 2019-04-23 (×2): 25 ug via INTRAVENOUS
  Administered 2019-04-23: 50 ug via INTRAVENOUS

## 2019-04-23 MED ORDER — LIDOCAINE-EPINEPHRINE 1 %-1:100000 IJ SOLN
INTRAMUSCULAR | Status: AC
  Filled 2019-04-23: qty 2

## 2019-04-23 MED ORDER — MIDAZOLAM HCL 2 MG/2ML IJ SOLN
INTRAMUSCULAR | Status: AC | PRN
  Administered 2019-04-23: 0.5 mg via INTRAVENOUS
  Administered 2019-04-23: 1 mg via INTRAVENOUS
  Administered 2019-04-23: 0.5 mg via INTRAVENOUS

## 2019-04-23 MED ORDER — CEFAZOLIN SODIUM-DEXTROSE 2-4 GM/100ML-% IV SOLN
2.0000 g | INTRAVENOUS | Status: AC
  Administered 2019-04-23: 2 g via INTRAVENOUS

## 2019-04-23 MED ORDER — SODIUM CHLORIDE 0.9 % IV SOLN
INTRAVENOUS | Status: DC
  Administered 2019-04-23: 14:00:00 via INTRAVENOUS

## 2019-04-23 NOTE — Consult Note (Signed)
Chief Complaint: Patient was seen in consultation today for Port-A-Cath placement and right Pleurx catheter placement  Referring Physician(s): [place]  Supervising Physician: Jacqulynn Cadet  Patient Status: [facility] - Out-pt  History of Present Illness: Sarah [name] is an 85 y.o. female with prior history of right breast cancer in 2013, prior PE and left lower extremity DVT in October of this year along with newly diagnosed angioimmunoblastic T-cell lymphoma.  PET scan performed on 04/08/2019 revealed:  1. Widespread hypermetabolic adenopathy in the neck, chest, and abdomen/pelvis, primarily in the Deauville 4 and Deauville 5 range. There is also hypermetabolic activity along the posterior nasopharynx in lingual tonsillar regions potentially indicating sites of involvement. 2. The subtle hypodensities in the spine and bony pelvis are not appreciably hypermetabolic may be incidental, surveillance is suggested. 3. Bilateral thyroid activity but especially on the left. Probably from thyroiditis. 4. A 6 mm subpleural nodule anteriorly in the right lower lobe not appreciably hypermetabolic but is below sensitive PET-CT size thresholds and merits surveillance. 5. Mildly accentuated diffuse splenic activity without splenomegaly or focal splenic lesion identified. 6. Other imaging findings of potential clinical significance: Aortic Atherosclerosis (ICD10-I70.0). Coronary atherosclerosis. Large right and small left pleural effusions. Moderate cardiomegaly. Small pericardial effusion. Mild mesenteric edema. Large left renal cyst. Fullness of both renal collecting systems with suspected nonobstructive left nephrolithiasis. Degenerative grade 1 anterolisthesis at L4-5 at L5-S1. Trace pelvic ascites  She is status post right thoracentesis by CCM on 03/06/2019 yielding 1.2 L of fluid.  Cytology was negative.  She presents today for Port-A-Cath placement for  chemotherapy as well as right Pleurx catheter placement. Past Medical History:  Diagnosis Date   Allergy    codeine, thiazides   Anxiety    new dx   Arthritis    Breast cancer ([place]) 03/14/12   bx=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,ER/PR=+,upper inner quad   Cancer (HCC)    breast   Cataract    DVT (deep venous thrombosis) ([place]) 02/2019   left leg   Dyspnea    Edema    both legs feet and toe, abdomen   Glaucoma    laser treated years ago   HOH (hard of hearing)    Hyperlipemia    Hypertension    Hypothyroidism    Pancreatic cyst    benign   PONV (postoperative nausea and vomiting)    Pulmonary embolism ([place]) 02/2019   bilateral    Radiation 06/11/2012-07/12/2012   17 sessions 4250 cGy, 3 sessions 750 cGy   Vertigo    Wears glasses    Wears partial dentures    partial upper    Past Surgical History:  Procedure Laterality Date   ABDOMINAL HYSTERECTOMY  1966   1/2 ovary left in    [place]   lumpectomy-lt   CATARACT EXTRACTION     b/l   COLONOSCOPY     EXCISION MASS NECK Left 03/17/2019    EXCISION MASS NECK (Left Neck)   EXCISION MASS NECK Left 03/17/2019   Procedure: EXCISION MASS NECK;  Surgeon: Helayne Seminole, MD;  Location: [facility] OR;  Service: ENT;  Laterality: Left;   EYE SURGERY Bilateral    bilateral cataract removal   JOINT REPLACEMENT  2013   rt total knee   JOINT REPLACEMENT  1995   lt total knee   PARTIAL MASTECTOMY WITH NEEDLE LOCALIZATION  04/09/2012   Procedure: PARTIAL MASTECTOMY WITH NEEDLE LOCALIZATION;  Surgeon: Adin Hector, MD;  Location: [facility]  [place];  Service: General;  Laterality: Right;   SKIN BIOPSY Left 03/17/2019   LEFT THIGH   SKIN BIOPSY Left 03/17/2019   Procedure: Skin Biopsy Left Thigh;  Surgeon: Helayne Seminole, MD;  Location: [facility] OR;  Service: ENT;  Laterality: Left;   TONSILLECTOMY     TOTAL KNEE ARTHROPLASTY  08/16/2011   Procedure:  TOTAL KNEE ARTHROPLASTY;  Surgeon: Ninetta Lights, MD;  Location: [facility];  Service: Orthopedics;  Laterality: Right;    Allergies: Codeine, Latex, and Thiazide-type diuretics  Medications: Prior to Admission medications   Medication Sig Start Date End Date Taking? Authorizing Provider  amLODipine (NORVASC) 2.5 MG tablet Take 2.5-5 mg by mouth See admin instructions. 5 mg in the morning, 2.5 mg in the evening 12/21/18   [provider]  apixaban (ELIQUIS) 5 MG TABS tablet Take 1 tablet (5 mg total) by mouth 2 (two) times daily. 04/07/19   Julian Hy, DO  benzonatate (TESSALON) 200 MG capsule Take 1 capsule (200 mg total) by mouth 3 (three) times daily. 03/08/19   Mercy Riding, MD  Calcium Carbonate (CALCIUM 600 PO) Take 600 mg by mouth daily.    [provider]  cetirizine (ZYRTEC) 10 MG tablet Take 10 mg by mouth daily.  01/23/19   [provider]  Cholecalciferol (VITAMIN D) 50 MCG (2000 UT) tablet Take 2,000 Units by mouth 2 (two) times daily.    [provider]  Cyanocobalamin (VITAMIN B-12 PO) Take 3,000 mcg by mouth daily.     [provider]  dexamethasone (DECADRON) 4 MG tablet Take 2 tablets by mouth once a day for 3 days after chemo. Take with food. 04/14/19   Brunetta Genera, MD  fluticasone furoate-vilanterol (BREO ELLIPTA) 100-25 MCG/INH AEPB Inhale 1 puff into the lungs daily. 03/07/19   Mercy Riding, MD  hydrOXYzine (ATARAX/VISTARIL) 25 MG tablet Take 1 tablet (25 mg total) by mouth at bedtime as needed for itching. 03/08/19   Mercy Riding, MD  levothyroxine (SYNTHROID) 25 MCG tablet Take 25 mcg by mouth daily before breakfast.  09/20/18   [provider]  lidocaine-prilocaine (EMLA) cream Apply to affected area once 04/14/19   Brunetta Genera, MD  Multiple Vitamins-Minerals (PRESERVISION AREDS PO) Take 1 capsule by mouth 2 (two) times daily.     [provider]  ondansetron (ZOFRAN) 8 MG tablet Take 1  tablet (8 mg total) by mouth 2 (two) times daily as needed. Start on the third day after chemotherapy. 04/14/19   Brunetta Genera, MD  prochlorperazine (COMPAZINE) 10 MG tablet Take 1 tablet (10 mg total) by mouth every 6 (six) hours as needed (Nausea or vomiting). 04/14/19   Brunetta Genera, MD  triamcinolone cream (KENALOG) 0.1 % Apply 1 application topically 3 (three) times daily.  02/26/19   [provider]  zolpidem (AMBIEN) 10 MG tablet Take 2.5 mg by mouth at bedtime.  07/24/14   [provider]     Family History  Problem Relation Age of Onset   Heart disease Father    Cancer Maternal Aunt        stomach    Social History   Socioeconomic History   Marital status: Widowed    Spouse name: Not on file   Number of children: 2   Years of education: Not on file   Highest education level: Not on file  Occupational History   Occupation: Retired    Comment: Community education officer  Social Designer, fashion/clothing strain: Not on file   Food insecurity    Worry: Not on file    Inability: Not on file   Transportation needs    Medical: Not on file    Non-medical: Not on file  Tobacco Use   Smoking status: Never Smoker   Smokeless tobacco: Never Used  Substance and Sexual Activity   Alcohol use: No   Drug use: No   Sexual activity: Not Currently  Lifestyle   Physical activity    Days per week: Not on file    Minutes per session: Not on file   Stress: Not on file  Relationships   Social connections    Talks on phone: Not on file    Gets together: Not on file    Attends religious service: Not on file    Active member of club or organization: Not on file    Attends meetings of clubs or organizations: Not on file    Relationship status: Not on file  Other Topics Concern   Not on file  Social History Narrative   Not on file      Review of Systems currently denies fever, headache, chest pain, worsening dyspnea, cough,  abdominal/back pain, nausea, vomiting or bleeding. Vital Signs: Blood pressure 141/81, heart rate 78, temp 97.4, respirations 18, O2 sat 99% room air   Physical Exam awake, alert.  Chest with diminished breath sounds bases; heart with regular rate and rhythm.  Abdomen soft, positive bowel sounds, nontender.  Bilateral lower extremity edema noted  Imaging: Nm Pet Image Initial (pi) Skull Base To Thigh  Result Date: 04/08/2019 CLINICAL DATA:  Initial treatment strategy for large B-cell lymphoma. EXAM: NUCLEAR MEDICINE PET SKULL BASE TO THIGH TECHNIQUE: 6.0 mCi F-18 FDG was injected intravenously. Full-ring PET imaging was performed from the skull base to thigh after the radiotracer. CT data was obtained and used for attenuation correction and anatomic localization. Fasting blood glucose: 86 mg/dl COMPARISON:  Multiple exams, including CT scans from 03/06/2019 and 02/27/2019 FINDINGS: Mediastinal blood pool activity: SUV max 1.8 Liver activity: SUV max 2.8 NECK: Posterior nasopharyngeal hypermetabolic activity, maximum SUV 7.6 on the left and 6.2 on the right. Lingual tonsillar activity maximum SUV 7.7 (Deauville 5). Hypermetabolic bilateral superficial parotid, level IIa, level IIb, level V, level Ia, level Ib, level III, and level IV hypermetabolic adenopathy noted. A left level IIa lymph node measuring 1.2 cm in short axis on image 28/4 has maximum SUV of 9.3, Deauville 5. An index left level Roman numeral 4 lymph node measuring 1.1 cm in short axis on image 38/4 has maximum SUV of 5.0, Deauville 4. Bilateral thyroid activity is present, but is greater on the enlarged left thyroid lobe, maximum SUV of 6.2, Deauville 4. The right thyroid lobe has maximum SUV of 3.6, Deauville 4. Incidental CT findings: Bilateral common carotid atherosclerotic calcification. CHEST: Bilateral hypermetabolic axillary, supraclavicular, right paratracheal, left paratracheal, AP window, bilateral hilar, right infrahilar, and  subcarinal adenopathy. Right paratracheal node 1.4 cm in short axis on image 53/4, maximum SUV 7.6, Deauville 5. Right hilar lymph node 1.1 cm in short axis on image 64/4, maximum SUV 7.2, Deauville 5. Subcarinal lymph node approximately 1.3 cm in short axis on image 71/4, maximum SUV 6.1, Deauville 4. Incidental CT findings: Large right and small left pleural effusions with passive atelectasis. Coronary, aortic arch, and branch vessel atherosclerotic vascular disease. Moderate cardiomegaly. Small pericardial effusion. Right lower lobe anterior subpleural nodule 0.6 cm at  its base, image 85/4, not appreciably hypermetabolic, stable in size. This was previously reported on chest CT as right middle lobe but I believe that due to volume loss this is actually in the right lower lobe. ABDOMEN/PELVIS: Hypermetabolic right gastric, gastrohepatic ligament, porta hepatis, peripancreatic, retroperitoneal, mesenteric, common iliac, external iliac, and inguinal adenopathy identified. Index peripancreatic node measuring 1.2 cm in short axis on image 103/4 has maximum SUV of 9.0, Deauville L5. Portacaval node measuring 2.0 cm in short axis on image 108/4 has a maximum SUV of 5.2, Deauville 4. Conglomerate left periaortic adenopathy measuring 2.3 cm in short axis on image 116/4 has maximum SUV of 3.3, Deauville 4. A left common iliac lymph node measuring 0.9 cm in short axis on image 133/4 has maximum SUV of 4.9, Deauville 4. A left inguinal lymph node measuring 1.2 cm in short axis on image 165/4 has maximum SUV of 3.8, Deauville 4. The spleen measures 11.4 by 7.0 by 9.1 cm (volume = 380 cm^3) and has metabolic uptake diffusely mildly higher than the liver, with the typical maximum SUV of 3.7, Deauville 4. Incidental CT findings: Aortoiliac atherosclerotic vascular disease. Mild mesenteric edema. Large left renal cyst. Fullness of both renal collecting systems. Punctate 1-2 mm left kidney lower pole calculi. Trace pelvic ascites.  SKELETON: Minimal heterogeneity of marrow activity without definite focal hypermetabolic lesion identified. The subtle hypodensities in the vertebra and bony pelvis are not discernibly hypermetabolic. Incidental CT findings: Degenerative grade 1 anterolisthesis at L4-5 and L5-S1. Degenerative subcortical cyst formation in the anterior superior right acetabulum. IMPRESSION: 1. Widespread hypermetabolic adenopathy in the neck, chest, and abdomen/pelvis, primarily in the Deauville 4 and Deauville 5 range. There is also hypermetabolic activity along the posterior nasopharynx in lingual tonsillar regions potentially indicating sites of involvement. 2. The subtle hypodensities in the spine and bony pelvis are not appreciably hypermetabolic may be incidental, surveillance is suggested. 3. Bilateral thyroid activity but especially on the left. Probably from thyroiditis. 4. A 6 mm subpleural nodule anteriorly in the right lower lobe not appreciably hypermetabolic but is below sensitive PET-CT size thresholds and merits surveillance. 5. Mildly accentuated diffuse splenic activity without splenomegaly or focal splenic lesion identified. 6. Other imaging findings of potential clinical significance: Aortic Atherosclerosis (ICD10-I70.0). Coronary atherosclerosis. Large right and small left pleural effusions. Moderate cardiomegaly. Small pericardial effusion. Mild mesenteric edema. Large left renal cyst. Fullness of both renal collecting systems with suspected nonobstructive left nephrolithiasis. Degenerative grade 1 anterolisthesis at L4-5 at L5-S1. Trace pelvic ascites. Electronically Signed   By: Van Clines M.D.   On: 04/08/2019 13:39    Labs:  CBC: Recent Labs    03/06/19 0158 03/07/19 0906 03/27/19 1305 04/21/19 1515  WBC 8.3 8.4 8.5 6.4  HGB 14.0 13.1 13.5 13.1  HCT 45.3 39.5 42.6 41.5  PLT 72* 214 211 210    COAGS: No results for input(s): INR, APTT in the last 8760 hours.  BMP: Recent Labs     03/07/19 0906 03/08/19 1120 03/27/19 1305 04/21/19 1515  NA 140 139 142 143  K 3.1* 4.1 4.4 3.8  CL 106 109 105 102  CO2 23 21* 26 30  GLUCOSE 142* 134* 98 112*  BUN 17 13 19  30*  CALCIUM 8.6* 8.6* 10.0 10.5*  CREATININE 0.76 0.68 0.75 0.83  GFRNONAA >60 >60 >60 >60  GFRAA >60 >60 >60 >60    LIVER FUNCTION TESTS: Recent Labs    03/06/19 0158 03/27/19 1305 04/21/19 1515  BILITOT 0.4 0.5 0.3  AST 21 14* 14*  ALT 11 12 18   ALKPHOS 85 104 84  PROT 6.0* 6.3* 6.8  ALBUMIN 3.4* 3.6 3.5    TUMOR MARKERS: No results for input(s): AFPTM, CEA, CA199, CHROMGRNA in the last 8760 hours.  Assessment and Plan: 85 y.o. female with prior history of right breast cancer in 2013, prior PE and left lower extremity DVT in October of this year along with newly diagnosed angioimmunoblastic T-cell lymphoma.  PET scan performed on 04/08/2019 revealed:  1. Widespread hypermetabolic adenopathy in the neck, chest, and abdomen/pelvis, primarily in the Deauville 4 and Deauville 5 range. There is also hypermetabolic activity along the posterior nasopharynx in lingual tonsillar regions potentially indicating sites of involvement. 2. The subtle hypodensities in the spine and bony pelvis are not appreciably hypermetabolic may be incidental, surveillance is suggested. 3. Bilateral thyroid activity but especially on the left. Probably from thyroiditis. 4. A 6 mm subpleural nodule anteriorly in the right lower lobe not appreciably hypermetabolic but is below sensitive PET-CT size thresholds and merits surveillance. 5. Mildly accentuated diffuse splenic activity without splenomegaly or focal splenic lesion identified. 6. Other imaging findings of potential clinical significance: Aortic Atherosclerosis (ICD10-I70.0). Coronary atherosclerosis. Large right and small left pleural effusions. Moderate cardiomegaly. Small pericardial effusion. Mild mesenteric edema. Large left renal cyst. Fullness of both  renal collecting systems with suspected nonobstructive left nephrolithiasis. Degenerative grade 1 anterolisthesis at L4-5 at L5-S1. Trace pelvic ascites  She is status post right thoracentesis by CCM on 03/06/2019 yielding 1.2 L of fluid.  Cytology was negative.  She presents today for Port-A-Cath placement for chemotherapy as well as right Pleurx catheter placement.  Details/risks of procedures, including but not limited to, internal bleeding, infection, injury to adjacent structures discussed with patient with her understanding and consent.    Thank you for this interesting consult.  I greatly enjoyed meeting Sarah [name] and look forward to participating in their care.  A copy of this report was sent to the requesting provider on this date.  Electronically Signed: D. Rowe Robert, PA-C 04/23/2019, 1:23 PM   I spent a total of 30 minutes   in face to face in clinical consultation, greater than 50% of which was counseling/coordinating care for Port-A-Cath and right Pleurx catheter placements

## 2019-04-23 NOTE — Discharge Instructions (Signed)
For problems or questions regarding the venous access device, contact Interventional Radiologist PA or IR on call at [phone].  You may remove your dressing and shower tomorrow.  DO NOT use EMLA cream for 2 weeks after port placement as this cream will remove surgical glue on your incision.   Moderate Conscious Sedation, Adult, Care After These instructions provide you with information about caring for yourself after your procedure. Your health care provider may also give you more specific instructions. Your treatment has been planned according to current medical practices, but problems sometimes occur. Call your health care provider if you have any problems or questions after your procedure. What can I expect after the procedure? After your procedure, it is common:  To feel sleepy for several hours.  To feel clumsy and have poor balance for several hours.  To have poor judgment for several hours.  To vomit if you eat too soon. Follow these instructions at home: For at least 24 hours after the procedure:   Do not: ? Participate in activities where you could fall or become injured. ? Drive. ? Use heavy machinery. ? Drink alcohol. ? Take sleeping pills or medicines that cause drowsiness. ? Make important decisions or sign legal documents. ? Take care of children on your own.  Rest. Eating and drinking  Follow the diet recommended by your health care provider.  If you vomit: ? Drink water, juice, or soup when you can drink without vomiting. ? Make sure you have little or no nausea before eating solid foods. General instructions  Have a responsible adult stay with you until you are awake and alert.  Take over-the-counter and prescription medicines only as told by your health care provider.  If you smoke, do not smoke without supervision.  Keep all follow-up visits as told by your health care provider. This is important. Contact a health care provider if:  You keep  feeling nauseous or you keep vomiting.  You feel light-headed.  You develop a rash.  You have a fever. Get help right away if:  You have trouble breathing. This information is not intended to replace advice given to you by your health care provider. Make sure you discuss any questions you have with your health care provider. Document Released: 02/26/2013 Document Revised: 04/20/2017 Document Reviewed: 08/28/2015 Elsevier Patient Education  [place] Insertion, Care After This sheet gives you information about how to care for yourself after your procedure. Your health care provider may also give you more specific instructions. If you have problems or questions, contact your health care provider. What can I expect after the procedure? After the procedure, it is common to have:  Discomfort at the port insertion site.  Bruising on the skin over the port. This should improve over 3-4 days. Follow these instructions at home: [facility] care  After your port is placed, you will get a manufacturer's information card. The card has information about your port. Keep this card with you at all times.  Take care of the port as told by your health care provider. Ask your health care provider if you or a family member can get training for taking care of the port at home. A home health care nurse may also take care of the port.  Make sure to remember what type of port you have. Incision care      Follow instructions from your health care provider about how to take care of your port insertion site.  Make sure you: ? Wash your hands with soap and water before and after you change your bandage (dressing). If soap and water are not available, use hand sanitizer. ? Change your dressing as told by your health care provider. ? Leave stitches (sutures), skin glue, or adhesive strips in place. These skin closures may need to stay in place for 2 weeks or longer. If adhesive strip  edges start to loosen and curl up, you may trim the loose edges. Do not remove adhesive strips completely unless your health care provider tells you to do that.  Check your port insertion site every day for signs of infection. Check for: ? Redness, swelling, or pain. ? Fluid or blood. ? Warmth. ? Pus or a bad smell. Activity  Return to your normal activities as told by your health care provider. Ask your health care provider what activities are safe for you.  Do not lift anything that is heavier than 10 lb (4.5 kg), or the limit that you are told, until your health care provider says that it is safe. General instructions  Take over-the-counter and prescription medicines only as told by your health care provider.  Do not take baths, swim, or use a hot tub until your health care provider approves. Ask your health care provider if you may take showers. You may only be allowed to take sponge baths.  Do not drive for 24 hours if you were given a sedative during your procedure.  Wear a medical alert bracelet in case of an emergency. This will tell any health care providers that you have a port.  Keep all follow-up visits as told by your health care provider. This is important. Contact a health care provider if:  You cannot flush your port with saline as directed, or you cannot draw blood from the port.  You have a fever or chills.  You have redness, swelling, or pain around your port insertion site.  You have fluid or blood coming from your port insertion site.  Your port insertion site feels warm to the touch.  You have pus or a bad smell coming from the port insertion site. Get help right away if:  You have chest pain or shortness of breath.  You have bleeding from your port that you cannot control. Summary  Take care of the port as told by your health care provider. Keep the manufacturer's information card with you at all times.  Change your dressing as told by your health  care provider.  Contact a health care provider if you have a fever or chills or if you have redness, swelling, or pain around your port insertion site.  Keep all follow-up visits as told by your health care provider. This information is not intended to replace advice given to you by your health care provider. Make sure you discuss any questions you have with your health care provider. Document Released: 02/26/2013 Document Revised: 12/04/2017 Document Reviewed: 12/04/2017 Elsevier Patient Education  [place].

## 2019-04-23 NOTE — Sedation Documentation (Signed)
Port finished

## 2019-04-23 NOTE — Telephone Encounter (Signed)
Spoke with patients daughter. Having push back from drain company. Called drain company they have order but patient has to have drain placed and sent home. Then patient calls to let drain company know they have been discharged per insurance.  While on phone with drain company, call came in from radiology and patient never had drain placed.   Dr. Carlis Abbott would like pt to come in tomorrow 04/24/19 and have chest xray done.  Called patient's daughter, Patient has chemo 12/3 will be in office for appt on 04/25/19 will have cxr prior to visit.  Order placed patient aware to come in 10 minutes before appt to have xray done.  Nothing further needed at this time.

## 2019-04-23 NOTE — Discharge Instructions (Signed)
Please call Radiology clinic [phone] during the day and after hours and ask for Interventional Radiologist on call [phone] with any questions or concern about your Port and Plurex drain.   Moderate Conscious Sedation, Adult, Care After These instructions provide you with information about caring for yourself after your procedure. Your health care provider may also give you more specific instructions. Your treatment has been planned according to current medical practices, but problems sometimes occur. Call your health care provider if you have any problems or questions after your procedure. What can I expect after the procedure? After your procedure, it is common:  To feel sleepy for several hours.  To feel clumsy and have poor balance for several hours.  To have poor judgment for several hours.  To vomit if you eat too soon. Follow these instructions at home: For at least 24 hours after the procedure:   Do not: ? Participate in activities where you could fall or become injured. ? Drive. ? Use heavy machinery. ? Drink alcohol. ? Take sleeping pills or medicines that cause drowsiness. ? Make important decisions or sign legal documents. ? Take care of children on your own.  Rest. Eating and drinking  Follow the diet recommended by your health care provider.  If you vomit: ? Drink water, juice, or soup when you can drink without vomiting. ? Make sure you have little or no nausea before eating solid foods. General instructions  Have a responsible adult stay with you until you are awake and alert.  Take over-the-counter and prescription medicines only as told by your health care provider.  If you smoke, do not smoke without supervision.  Keep all follow-up visits as told by your health care provider. This is important. Contact a health care provider if:  You keep feeling nauseous or you keep vomiting.  You feel light-headed.  You develop a rash.  You have a  fever. Get help right away if:  You have trouble breathing. This information is not intended to replace advice given to you by your health care provider. Make sure you discuss any questions you have with your health care provider. Document Released: 02/26/2013 Document Revised: 04/20/2017 Document Reviewed: 08/28/2015 Elsevier Patient Education  [place] Insertion, Care After This sheet gives you information about how to care for yourself after your procedure. Your health care provider may also give you more specific instructions. If you have problems or questions, contact your health care provider. What can I expect after the procedure? After the procedure, it is common to have:  Discomfort at the port insertion site.  Bruising on the skin over the port. This should improve over 3-4 days. Follow these instructions at home: [facility] care  After your port is placed, you will get a manufacturer's information card. The card has information about your port. Keep this card with you at all times.  Take care of the port as told by your health care provider. Ask your health care provider if you or a family member can get training for taking care of the port at home. A home health care nurse may also take care of the port.  Make sure to remember what type of port you have. Incision care      Follow instructions from your health care provider about how to take care of your port insertion site. Make sure you: ? Wash your hands with soap and water before and after you change your bandage (dressing). If  soap and water are not available, use hand sanitizer. ? Change your dressing as told by your health care provider. ? Leave stitches (sutures), skin glue, or adhesive strips in place. These skin closures may need to stay in place for 2 weeks or longer. If adhesive strip edges start to loosen and curl up, you may trim the loose edges. Do not remove adhesive strips  completely unless your health care provider tells you to do that.  Check your port insertion site every day for signs of infection. Check for: ? Redness, swelling, or pain. ? Fluid or blood. ? Warmth. ? Pus or a bad smell. Activity  Return to your normal activities as told by your health care provider. Ask your health care provider what activities are safe for you.  Do not lift anything that is heavier than 10 lb (4.5 kg), or the limit that you are told, until your health care provider says that it is safe. General instructions  Take over-the-counter and prescription medicines only as told by your health care provider.  Do not take baths, swim, or use a hot tub until your health care provider approves. Ask your health care provider if you may take showers. You may only be allowed to take sponge baths.  Do not drive for 24 hours if you were given a sedative during your procedure.  Wear a medical alert bracelet in case of an emergency. This will tell any health care providers that you have a port.  Keep all follow-up visits as told by your health care provider. This is important. Contact a health care provider if:  You cannot flush your port with saline as directed, or you cannot draw blood from the port.  You have a fever or chills.  You have redness, swelling, or pain around your port insertion site.  You have fluid or blood coming from your port insertion site.  Your port insertion site feels warm to the touch.  You have pus or a bad smell coming from the port insertion site. Get help right away if:  You have chest pain or shortness of breath.  You have bleeding from your port that you cannot control. Summary  Take care of the port as told by your health care provider. Keep the manufacturer's information card with you at all times.  Change your dressing as told by your health care provider.  Contact a health care provider if you have a fever or chills or if you have  redness, swelling, or pain around your port insertion site.  Keep all follow-up visits as told by your health care provider. This information is not intended to replace advice given to you by your health care provider. Make sure you discuss any questions you have with your health care provider. Document Released: 02/26/2013 Document Revised: 12/04/2017 Document Reviewed: 12/04/2017 Elsevier Patient Education  [place].   Chest Tube Insertion, Adult, Care After This sheet gives you information about how to care for yourself after your procedure. Your health care provider may also give you more specific instructions. If you have problems or questions, contact your health care provider. What can I expect after the procedure? After the procedure, it is common to have:  Mild discomfort.  Slight bruising. Follow these instructions at home: Incision care   Follow instructions from your health care provider about how to take care of your incision. Make sure you: ? Wash your hands with soap and water before you change your bandage (dressing). If  soap and water are not available, use hand sanitizer. ? Change your dressing as told by your health care provider. ? Leave stitches (sutures), skin glue, or adhesive strips in place. These skin closures may need to stay in place for 2 weeks or longer. If adhesive strip edges start to loosen and curl up, you may trim the loose edges. Do not remove adhesive strips completely unless your health care provider tells you to do that.  Check your incision area every day for signs of infection. Check for: ? More redness, swelling, or pain. ? More fluid or blood. ? Warmth. ? Pus or a bad smell.  Do not take baths, swim, or use a hot tub until your health care provider approves. Ask your health care provider if you can take showers or have a sponge bath. You may need to keep the incision area dry for a few days. General instructions  Take  over-the-counter and prescription medicines only as told by your health care provider.  Return to your normal activities as told by your health care provider. Ask your health care provider what activities are safe.  Keep all follow-up visits as told by your health care provider. This is important.  Do not drive for 24 hours if you were given a medicine to help you relax (sedative). Contact a health care provider if:  You have chills or fever.  You have pain that is not controlled with medicine.  You have more redness, swelling, or pain around your incision.  You have more fluid or blood coming from your incision.  Your incision feels warm to the touch.  You have pus or a bad smell coming from the incision. Get help right away if:  You have chest pain or trouble breathing.  You develop red streaking on your skin around your incision. This information is not intended to replace advice given to you by your health care provider. Make sure you discuss any questions you have with your health care provider. Document Released: 02/26/2013 Document Revised: 04/20/2017 Document Reviewed: 10/20/2015 Elsevier Patient Education  2020 [employer].

## 2019-04-23 NOTE — Procedures (Signed)
Interventional Radiology Procedure Note  Procedure: Placement of a right IJ approach single lumen PowerPort.  Tip is positioned at the superior cavoatrial junction and catheter is ready for immediate use.  Complications: No immediate Recommendations:  - Ok to shower tomorrow - Do not submerge for 7 days - Routine line care   Signed,  Heath K. McCullough, MD   

## 2019-04-24 ENCOUNTER — Telehealth: Payer: Self-pay | Admitting: *Deleted

## 2019-04-24 ENCOUNTER — Inpatient Hospital Stay (HOSPITAL_BASED_OUTPATIENT_CLINIC_OR_DEPARTMENT_OTHER): Payer: Medicare Other | Admitting: Medical

## 2019-04-24 ENCOUNTER — Inpatient Hospital Stay: Payer: Medicare Other | Attending: Hematology

## 2019-04-24 ENCOUNTER — Other Ambulatory Visit: Payer: Self-pay | Admitting: Medical

## 2019-04-24 ENCOUNTER — Other Ambulatory Visit: Payer: Self-pay

## 2019-04-24 ENCOUNTER — Other Ambulatory Visit: Payer: Self-pay | Admitting: *Deleted

## 2019-04-24 VITALS — BP 121/76 | HR 92 | Temp 97.8°F | Resp 18

## 2019-04-24 DIAGNOSIS — Z5111 Encounter for antineoplastic chemotherapy: Secondary | ICD-10-CM | POA: Insufficient documentation

## 2019-04-24 DIAGNOSIS — Z452 Encounter for adjustment and management of vascular access device: Secondary | ICD-10-CM | POA: Diagnosis not present

## 2019-04-24 DIAGNOSIS — I2699 Other pulmonary embolism without acute cor pulmonale: Secondary | ICD-10-CM | POA: Insufficient documentation

## 2019-04-24 DIAGNOSIS — L299 Pruritus, unspecified: Secondary | ICD-10-CM

## 2019-04-24 DIAGNOSIS — L97929 Non-pressure chronic ulcer of unspecified part of left lower leg with unspecified severity: Secondary | ICD-10-CM | POA: Insufficient documentation

## 2019-04-24 DIAGNOSIS — C8440 Peripheral T-cell lymphoma, not classified, unspecified site: Secondary | ICD-10-CM

## 2019-04-24 DIAGNOSIS — Z853 Personal history of malignant neoplasm of breast: Secondary | ICD-10-CM | POA: Insufficient documentation

## 2019-04-24 DIAGNOSIS — E039 Hypothyroidism, unspecified: Secondary | ICD-10-CM | POA: Diagnosis not present

## 2019-04-24 DIAGNOSIS — Z5112 Encounter for antineoplastic immunotherapy: Secondary | ICD-10-CM | POA: Insufficient documentation

## 2019-04-24 DIAGNOSIS — C865 Angioimmunoblastic T-cell lymphoma: Secondary | ICD-10-CM | POA: Insufficient documentation

## 2019-04-24 DIAGNOSIS — Z7189 Other specified counseling: Secondary | ICD-10-CM

## 2019-04-24 DIAGNOSIS — J90 Pleural effusion, not elsewhere classified: Secondary | ICD-10-CM | POA: Diagnosis not present

## 2019-04-24 DIAGNOSIS — Z5189 Encounter for other specified aftercare: Secondary | ICD-10-CM | POA: Insufficient documentation

## 2019-04-24 MED ORDER — PREDNISONE 50 MG PO TABS
50.0000 mg | ORAL_TABLET | Freq: Once | ORAL | Status: AC
  Administered 2019-04-24: 50 mg via ORAL

## 2019-04-24 MED ORDER — FAMOTIDINE IN NACL 20-0.9 MG/50ML-% IV SOLN
20.0000 mg | Freq: Once | INTRAVENOUS | Status: AC | PRN
  Administered 2019-04-24: 20 mg via INTRAVENOUS

## 2019-04-24 MED ORDER — SODIUM CHLORIDE 0.9% FLUSH
10.0000 mL | INTRAVENOUS | Status: DC | PRN
  Administered 2019-04-24: 10 mL
  Filled 2019-04-24: qty 10

## 2019-04-24 MED ORDER — DOXORUBICIN HCL CHEMO IV INJECTION 2 MG/ML
25.0000 mg/m2 | Freq: Once | INTRAVENOUS | Status: AC
  Administered 2019-04-24: 40 mg via INTRAVENOUS
  Filled 2019-04-24: qty 20

## 2019-04-24 MED ORDER — SODIUM CHLORIDE 0.9 % IV SOLN
400.0000 mg/m2 | Freq: Once | INTRAVENOUS | Status: AC
  Administered 2019-04-24: 620 mg via INTRAVENOUS
  Filled 2019-04-24: qty 31

## 2019-04-24 MED ORDER — ACETAMINOPHEN 325 MG PO TABS
ORAL_TABLET | ORAL | Status: AC
  Filled 2019-04-24: qty 2

## 2019-04-24 MED ORDER — SODIUM CHLORIDE 0.9 % IV SOLN
1.2000 mg/kg | Freq: Once | INTRAVENOUS | Status: AC
  Administered 2019-04-24: 65 mg via INTRAVENOUS
  Filled 2019-04-24: qty 13

## 2019-04-24 MED ORDER — PALONOSETRON HCL INJECTION 0.25 MG/5ML
0.2500 mg | Freq: Once | INTRAVENOUS | Status: AC
  Administered 2019-04-24: 0.25 mg via INTRAVENOUS

## 2019-04-24 MED ORDER — DIPHENHYDRAMINE HCL 50 MG/ML IJ SOLN
50.0000 mg | Freq: Once | INTRAMUSCULAR | Status: AC
  Administered 2019-04-24: 50 mg via INTRAVENOUS

## 2019-04-24 MED ORDER — PREDNISONE 50 MG PO TABS
ORAL_TABLET | ORAL | Status: AC
  Filled 2019-04-24: qty 1

## 2019-04-24 MED ORDER — SODIUM CHLORIDE 0.9 % IV SOLN
Freq: Once | INTRAVENOUS | Status: AC
  Administered 2019-04-24: 09:00:00 via INTRAVENOUS
  Filled 2019-04-24: qty 5

## 2019-04-24 MED ORDER — PREDNISONE 50 MG PO TABS: ORAL_TABLET | ORAL | 4 refills | Status: DC

## 2019-04-24 MED ORDER — DIPHENHYDRAMINE HCL 50 MG/ML IJ SOLN
INTRAMUSCULAR | Status: AC
  Filled 2019-04-24: qty 1

## 2019-04-24 MED ORDER — HEPARIN SOD (PORK) LOCK FLUSH 100 UNIT/ML IV SOLN
500.0000 [IU] | Freq: Once | INTRAVENOUS | Status: AC | PRN
  Administered 2019-04-24: 500 [IU]
  Filled 2019-04-24: qty 5

## 2019-04-24 MED ORDER — PALONOSETRON HCL INJECTION 0.25 MG/5ML
INTRAVENOUS | Status: AC
  Filled 2019-04-24: qty 5

## 2019-04-24 MED ORDER — SODIUM CHLORIDE 0.9 % IV SOLN
Freq: Once | INTRAVENOUS | Status: AC
  Administered 2019-04-24: 08:00:00 via INTRAVENOUS
  Filled 2019-04-24: qty 250

## 2019-04-24 MED ORDER — ACETAMINOPHEN 325 MG PO TABS
650.0000 mg | ORAL_TABLET | Freq: Once | ORAL | Status: AC
  Administered 2019-04-24: 650 mg via ORAL

## 2019-04-24 NOTE — Telephone Encounter (Signed)
Per pt's chart there is an order to [facility] for management of pt's pleural drain. Pt has an appt with Dr. Carlis Abbott on 04/25/2019.   [facility], line rang several times until busy signal.

## 2019-04-24 NOTE — Telephone Encounter (Signed)
Pt appt made. Verified with [place].   Nothing further needed at this time.

## 2019-04-24 NOTE — Telephone Encounter (Signed)
Contacted Dtr at her request to review new med: Take Prednisone 50mg  po daily for 5 days starting day after treatment. Do not take dexamethasone.  LVM with this is information. Encouraged her to contact office for further questions

## 2019-04-24 NOTE — Telephone Encounter (Signed)
Per Dr. Irene Limbo - change in treatment: hold the post treatment dexamethasone and take Prednisone 50mg  po daily for 5 days starting day after treatment instead. Prescription for prednisone to her pharmacy. Please contact patient with this information. Contacted daughter Child psychotherapist (patient is in infusion) and gave information. Encouraged [name] to call office if any questions. She verbalized understanding.

## 2019-04-24 NOTE — Progress Notes (Signed)
Pt 10-15 minutes into first cytoxan administration, states she is itching, especially on her back.  Pt states she has this once to twice a day due to her CLL & lymphoma.  Cytoxan stopped, V. Tanner PA called to bedside.  Pepcid 20 mg given IVPB.  Pt had continued itching, prednisone 50 mg given po per V. Tanner PA.  Cytoxan restarted after prednisone, pt tolerated well.  Itching resolved.

## 2019-04-24 NOTE — Patient Instructions (Signed)
[facility] Discharge Instructions for Patients Receiving Chemotherapy  Today you received the following chemotherapy agents Adriamycin, Cytoxan, Brentuximab  To help prevent nausea and vomiting after your treatment, we encourage you to take your nausea medication as prescribed.   If you develop nausea and vomiting that is not controlled by your nausea medication, call the clinic.   BELOW ARE SYMPTOMS THAT SHOULD BE REPORTED IMMEDIATELY:  *FEVER GREATER THAN 100.5 F  *CHILLS WITH OR WITHOUT FEVER  NAUSEA AND VOMITING THAT IS NOT CONTROLLED WITH YOUR NAUSEA MEDICATION  *UNUSUAL SHORTNESS OF BREATH  *UNUSUAL BRUISING OR BLEEDING  TENDERNESS IN MOUTH AND THROAT WITH OR WITHOUT PRESENCE OF ULCERS  *URINARY PROBLEMS  *BOWEL PROBLEMS  UNUSUAL RASH Items with * indicate a potential emergency and should be followed up as soon as possible.  Feel free to call the clinic should you have any questions or concerns. The clinic phone number is [phone].  Please show the [facility] at check-in to the Emergency Department and triage nurse.  Doxorubicin injection What is this medicine? DOXORUBICIN (dox oh ROO bi sin) is a chemotherapy drug. It is used to treat many kinds of cancer like leukemia, lymphoma, neuroblastoma, sarcoma, and Wilms' tumor. It is also used to treat bladder cancer, breast cancer, lung cancer, ovarian cancer, stomach cancer, and thyroid cancer. This medicine may be used for other purposes; ask your health care provider or pharmacist if you have questions. COMMON BRAND NAME(S): Adriamycin, Adriamycin PFS, Adriamycin RDF, Rubex What should I tell my health care provider before I take this medicine? They need to know if you have any of these conditions:  heart disease  history of low blood counts caused by a medicine  liver disease  recent or ongoing radiation therapy  an unusual or allergic reaction to doxorubicin, other chemotherapy  agents, other medicines, foods, dyes, or preservatives  pregnant or trying to get pregnant  breast-feeding How should I use this medicine? This drug is given as an infusion into a vein. It is administered in a hospital or clinic by a specially trained health care professional. If you have pain, swelling, burning or any unusual feeling around the site of your injection, tell your health care professional right away. Talk to your pediatrician regarding the use of this medicine in children. Special care may be needed. Overdosage: If you think you have taken too much of this medicine contact a poison control center or emergency room at once. NOTE: This medicine is only for you. Do not share this medicine with others. What if I miss a dose? It is important not to miss your dose. Call your doctor or health care professional if you are unable to keep an appointment. What may interact with this medicine? This medicine may interact with the following medications:  6-mercaptopurine  paclitaxel  phenytoin  St. John's Wort  trastuzumab  verapamil This list may not describe all possible interactions. Give your health care provider a list of all the medicines, herbs, non-prescription drugs, or dietary supplements you use. Also tell them if you smoke, drink alcohol, or use illegal drugs. Some items may interact with your medicine. What should I watch for while using this medicine? This drug may make you feel generally unwell. This is not uncommon, as chemotherapy can affect healthy cells as well as cancer cells. Report any side effects. Continue your course of treatment even though you feel ill unless your doctor tells you to stop. There is a maximum amount  of this medicine you should receive throughout your life. The amount depends on the medical condition being treated and your overall health. Your doctor will watch how much of this medicine you receive in your lifetime. Tell your doctor if you have  taken this medicine before. You may need blood work done while you are taking this medicine. Your urine may turn red for a few days after your dose. This is not blood. If your urine is dark or brown, call your doctor. In some cases, you may be given additional medicines to help with side effects. Follow all directions for their use. Call your doctor or health care professional for advice if you get a fever, chills or sore throat, or other symptoms of a cold or flu. Do not treat yourself. This drug decreases your body's ability to fight infections. Try to avoid being around people who are sick. This medicine may increase your risk to bruise or bleed. Call your doctor or health care professional if you notice any unusual bleeding. Talk to your doctor about your risk of cancer. You may be more at risk for certain types of cancers if you take this medicine. Do not become pregnant while taking this medicine or for 6 months after stopping it. Women should inform their doctor if they wish to become pregnant or think they might be pregnant. Men should not father a child while taking this medicine and for 6 months after stopping it. There is a potential for serious side effects to an unborn child. Talk to your health care professional or pharmacist for more information. Do not breast-feed an infant while taking this medicine. This medicine has caused ovarian failure in some women and reduced sperm counts in some men This medicine may interfere with the ability to have a child. Talk with your doctor or health care professional if you are concerned about your fertility. This medicine may cause a decrease in Co-Enzyme Q-10. You should make sure that you get enough Co-Enzyme Q-10 while you are taking this medicine. Discuss the foods you eat and the vitamins you take with your health care professional. What side effects may I notice from receiving this medicine? Side effects that you should report to your doctor or  health care professional as soon as possible:  allergic reactions like skin rash, itching or hives, swelling of the face, lips, or tongue  breathing problems  chest pain  fast or irregular heartbeat  low blood counts - this medicine may decrease the number of white blood cells, red blood cells and platelets. You may be at increased risk for infections and bleeding.  pain, redness, or irritation at site where injected  signs of infection - fever or chills, cough, sore throat, pain or difficulty passing urine  signs of decreased platelets or bleeding - bruising, pinpoint red spots on the skin, black, tarry stools, blood in the urine  swelling of the ankles, feet, hands  tiredness  weakness Side effects that usually do not require medical attention (report to your doctor or health care professional if they continue or are bothersome):  diarrhea  hair loss  mouth sores  nail discoloration or damage  nausea  red colored urine  vomiting This list may not describe all possible side effects. Call your doctor for medical advice about side effects. You may report side effects to FDA at 1-800-FDA-1088. Where should I keep my medicine? This drug is given in a hospital or clinic and will not be stored at home.  NOTE: This sheet is a summary. It may not cover all possible information. If you have questions about this medicine, talk to your doctor, pharmacist, or health care provider.  2020 Elsevier/Gold Standard (2016-12-20 11:01:26)  Cyclophosphamide injection What is this medicine? CYCLOPHOSPHAMIDE (sye kloe FOSS fa mide) is a chemotherapy drug. It slows the growth of cancer cells. This medicine is used to treat many types of cancer like lymphoma, myeloma, leukemia, breast cancer, and ovarian cancer, to name a few. This medicine may be used for other purposes; ask your health care provider or pharmacist if you have questions. COMMON BRAND NAME(S): Cytoxan, Neosar What should I  tell my health care provider before I take this medicine? They need to know if you have any of these conditions:  blood disorders  history of other chemotherapy  infection  kidney disease  liver disease  recent or ongoing radiation therapy  tumors in the bone marrow  an unusual or allergic reaction to cyclophosphamide, other chemotherapy, other medicines, foods, dyes, or preservatives  pregnant or trying to get pregnant  breast-feeding How should I use this medicine? This drug is usually given as an injection into a vein or muscle or by infusion into a vein. It is administered in a hospital or clinic by a specially trained health care professional. Talk to your pediatrician regarding the use of this medicine in children. Special care may be needed. Overdosage: If you think you have taken too much of this medicine contact a poison control center or emergency room at once. NOTE: This medicine is only for you. Do not share this medicine with others. What if I miss a dose? It is important not to miss your dose. Call your doctor or health care professional if you are unable to keep an appointment. What may interact with this medicine? This medicine may interact with the following medications:  amiodarone  amphotericin B  azathioprine  certain antiviral medicines for HIV or AIDS such as protease inhibitors (e.g., indinavir, ritonavir) and zidovudine  certain blood pressure medications such as benazepril, captopril, enalapril, fosinopril, lisinopril, moexipril, monopril, perindopril, quinapril, ramipril, trandolapril  certain cancer medications such as anthracyclines (e.g., daunorubicin, doxorubicin), busulfan, cytarabine, paclitaxel, pentostatin, tamoxifen, trastuzumab  certain diuretics such as chlorothiazide, chlorthalidone, hydrochlorothiazide, indapamide, metolazone  certain medicines that treat or prevent blood clots like warfarin  certain muscle relaxants such as  succinylcholine  cyclosporine  etanercept  indomethacin  medicines to increase blood counts like filgrastim, pegfilgrastim, sargramostim  medicines used as general anesthesia  metronidazole  natalizumab This list may not describe all possible interactions. Give your health care provider a list of all the medicines, herbs, non-prescription drugs, or dietary supplements you use. Also tell them if you smoke, drink alcohol, or use illegal drugs. Some items may interact with your medicine. What should I watch for while using this medicine? Visit your doctor for checks on your progress. This drug may make you feel generally unwell. This is not uncommon, as chemotherapy can affect healthy cells as well as cancer cells. Report any side effects. Continue your course of treatment even though you feel ill unless your doctor tells you to stop. Drink water or other fluids as directed. Urinate often, even at night. In some cases, you may be given additional medicines to help with side effects. Follow all directions for their use. Call your doctor or health care professional for advice if you get a fever, chills or sore throat, or other symptoms of a cold or flu. Do not  treat yourself. This drug decreases your body's ability to fight infections. Try to avoid being around people who are sick. This medicine may increase your risk to bruise or bleed. Call your doctor or health care professional if you notice any unusual bleeding. Be careful brushing and flossing your teeth or using a toothpick because you may get an infection or bleed more easily. If you have any dental work done, tell your dentist you are receiving this medicine. You may get drowsy or dizzy. Do not drive, use machinery, or do anything that needs mental alertness until you know how this medicine affects you. Do not become pregnant while taking this medicine or for 1 year after stopping it. Women should inform their doctor if they wish to  become pregnant or think they might be pregnant. Men should not father a child while taking this medicine and for 4 months after stopping it. There is a potential for serious side effects to an unborn child. Talk to your health care professional or pharmacist for more information. Do not breast-feed an infant while taking this medicine. This medicine may interfere with the ability to have a child. This medicine has caused ovarian failure in some women. This medicine has caused reduced sperm counts in some men. You should talk with your doctor or health care professional if you are concerned about your fertility. If you are going to have surgery, tell your doctor or health care professional that you have taken this medicine. What side effects may I notice from receiving this medicine? Side effects that you should report to your doctor or health care professional as soon as possible:  allergic reactions like skin rash, itching or hives, swelling of the face, lips, or tongue  low blood counts - this medicine may decrease the number of white blood cells, red blood cells and platelets. You may be at increased risk for infections and bleeding.  signs of infection - fever or chills, cough, sore throat, pain or difficulty passing urine  signs of decreased platelets or bleeding - bruising, pinpoint red spots on the skin, black, tarry stools, blood in the urine  signs of decreased red blood cells - unusually weak or tired, fainting spells, lightheadedness  breathing problems  dark urine  dizziness  palpitations  swelling of the ankles, feet, hands  trouble passing urine or change in the amount of urine  weight gain  yellowing of the eyes or skin Side effects that usually do not require medical attention (report to your doctor or health care professional if they continue or are bothersome):  changes in nail or skin color  hair loss  missed menstrual periods  mouth sores  nausea,  vomiting This list may not describe all possible side effects. Call your doctor for medical advice about side effects. You may report side effects to FDA at 1-800-FDA-1088. Where should I keep my medicine? This drug is given in a hospital or clinic and will not be stored at home. NOTE: This sheet is a summary. It may not cover all possible information. If you have questions about this medicine, talk to your doctor, pharmacist, or health care provider.  2020 Elsevier/Gold Standard (2012-03-22 16:22:58)  Brentuximab vedotin solution for injection What is this medicine? BRENTUXIMAB VEDOTIN (bren TUX see mab ve DOE tin) is a monoclonal antibody and a chemotherapy drug. It is used for treating Hodgkin lymphoma and certain non-Hodgkin lymphomas, such as anaplastic large-cell lymphoma, mycosis fungoides, and peripheral T-cell lymphoma. This medicine may be used  for other purposes; ask your health care provider or pharmacist if you have questions. COMMON BRAND NAME(S): ADCETRIS What should I tell my health care provider before I take this medicine? They need to know if you have any of these conditions:  immune system problems  infection (especially a virus infection such as chickenpox, cold sores, or herpes)  kidney disease  liver disease  low blood counts, like low white cell, platelet, or red cell counts  tingling of the fingers or toes, or other nerve disorder  an unusual or allergic reaction to brentuximab vedotin, other medicines, foods, dyes, or preservatives  pregnant or trying to get pregnant  breast-feeding How should I use this medicine? This medicine is for infusion into a vein. It is given by a health care professional in a hospital or clinic setting. Talk to your pediatrician regarding the use of this medicine in children. Special care may be needed. Overdosage: If you think you have taken too much of this medicine contact a poison control center or emergency room at  once. NOTE: This medicine is only for you. Do not share this medicine with others. What if I miss a dose? It is important not to miss your dose. Call your doctor or health care professional if you are unable to keep an appointment. What may interact with this medicine? This medicine may interact with the following medications:  ketoconazole  rifampin  St. John's wort; Hypericum perforatum This list may not describe all possible interactions. Give your health care provider a list of all the medicines, herbs, non-prescription drugs, or dietary supplements you use. Also tell them if you smoke, drink alcohol, or use illegal drugs. Some items may interact with your medicine. What should I watch for while using this medicine? Visit your doctor for checks on your progress. This drug may make you feel generally unwell. Report any side effects. Continue your course of treatment even though you feel ill unless your doctor tells you to stop. Call your doctor or health care professional for advice if you get a fever, chills or sore throat, or other symptoms of a cold or flu. Do not treat yourself. This drug decreases your body's ability to fight infections. Try to avoid being around people who are sick. This medicine may increase your risk to bruise or bleed. Call your doctor or health care professional if you notice any unusual bleeding. In some patients, this medicine may cause a serious brain infection that may cause death. If you have any problems seeing, thinking, speaking, walking, or standing, tell your doctor right away. If you cannot reach your doctor, urgently seek other source of medical care. Do not become pregnant while taking this medicine or for 6 months after stopping it. Women should inform their doctor if they wish to become pregnant or think they might be pregnant. Men should not father a child while taking this medicine and for 6 months after stopping it. There is a potential for serious  side effects to an unborn child. Talk to your health care professional or pharmacist for more information. Do not breast-feed an infant while taking this medicine. This may interfere with the ability to father a child. You should talk to your doctor or health care professional if you are concerned about your fertility. What side effects may I notice from receiving this medicine? Side effects that you should report to your doctor or health care professional as soon as possible:  allergic reactions like skin rash, itching or hives,  swelling of the face, lips, or tongue  changes in emotions or moods  diarrhea  low blood counts - this medicine may decrease the number of white blood cells, red blood cells and platelets. You may be at increased risk for infections and bleeding.  pain, tingling, numbness in the hands or feet  redness, blistering, peeling or loosening of the skin, including inside the mouth  shortness of breath  signs of infection - fever or chills, cough, sore throat, pain or difficulty passing urine  signs of decreased platelets or bleeding - bruising, pinpoint red spots on the skin, black, tarry stools, blood in the urine  signs of decreased red blood cells - unusually weak or tired, fainting spells, lightheadedness  signs of liver injury like dark yellow or brown urine; general ill feeling or flu-like symptoms; light-colored stools; loss of appetite; nausea; right upper belly pain; yellowing of the eyes or skin  stomach pain  sudden numbness or weakness of the face, arm or leg  vomiting Side effects that usually do not require medical attention (report to your doctor or health care professional if they continue or are bothersome):  constipation  dizziness  headache  muscle pain  tiredness This list may not describe all possible side effects. Call your doctor for medical advice about side effects. You may report side effects to FDA at 1-800-FDA-1088. Where  should I keep my medicine? This drug is given in a hospital or clinic and will not be stored at home. NOTE: This sheet is a summary. It may not cover all possible information. If you have questions about this medicine, talk to your doctor, pharmacist, or health care provider.  2020 Elsevier/Gold Standard (2017-04-09 14:10:02)  Coronavirus (COVID-19) Are you at risk?  Are you at risk for the Coronavirus (COVID-19)?  To be considered HIGH RISK for Coronavirus (COVID-19), you have to meet the following criteria:  . Traveled to [place], [place], [place], [place] or [place]; or in the [place] to [place], [place], [place], or [place]; and have fever, cough, and shortness of breath within the last 2 weeks of travel OR . Been in close contact with a person diagnosed with COVID-19 within the last 2 weeks and have fever, cough, and shortness of breath . IF YOU DO NOT MEET THESE CRITERIA, YOU ARE CONSIDERED LOW RISK FOR COVID-19.  What to do if you are HIGH RISK for COVID-19?  Marland Kitchen If you are having a medical emergency, call 911. . Seek medical care right away. Before you go to a doctor's office, urgent care or emergency department, call ahead and tell them about your recent travel, contact with someone diagnosed with COVID-19, and your symptoms. You should receive instructions from your physician's office regarding next steps of care.  . When you arrive at healthcare provider, tell the healthcare staff immediately you have returned from visiting [place], [place], [place], [place] or [place]; or traveled in the [place] to [place], [place], [place], or [place]; in the last two weeks or you have been in close contact with a person diagnosed with COVID-19 in the last 2 weeks.   . Tell the health care staff about your symptoms: fever, cough and shortness of breath. . After you have been seen by a medical provider, you will be either: o Tested for (COVID-19) and discharged home on  quarantine except to seek medical care if symptoms worsen, and asked to  - Stay home and avoid contact with others until  you get your results (4-5 days)  - Avoid travel on public transportation if possible (such as bus, train, or airplane) or o Sent to the Emergency Department by EMS for evaluation, COVID-19 testing, and possible admission depending on your condition and test results.  What to do if you are LOW RISK for COVID-19?  Reduce your risk of any infection by using the same precautions used for avoiding the common cold or flu:  Marland Kitchen Wash your hands often with soap and warm water for at least 20 seconds.  If soap and water are not readily available, use an alcohol-based hand sanitizer with at least 60% alcohol.  . If coughing or sneezing, cover your mouth and nose by coughing or sneezing into the elbow areas of your shirt or coat, into a tissue or into your sleeve (not your hands). . Avoid shaking hands with others and consider head nods or verbal greetings only. . Avoid touching your eyes, nose, or mouth with unwashed hands.  . Avoid close contact with people who are sick. . Avoid places or events with large numbers of people in one location, like concerts or sporting events. . Carefully consider travel plans you have or are making. . If you are planning any travel outside or inside the [place], visit the CDC's Travelers' Health webpage for the latest health notices. . If you have some symptoms but not all symptoms, continue to monitor at home and seek medical attention if your symptoms worsen. . If you are having a medical emergency, call 911.   [place] / e-Visit: [email or web address]         [facility]: [facility]: [phone]                   [facility]: [phone]

## 2019-04-25 ENCOUNTER — Inpatient Hospital Stay: Payer: Medicare Other

## 2019-04-25 ENCOUNTER — Ambulatory Visit (INDEPENDENT_AMBULATORY_CARE_PROVIDER_SITE_OTHER): Payer: Medicare Other

## 2019-04-25 ENCOUNTER — Encounter: Payer: Self-pay | Admitting: Critical Care Medicine

## 2019-04-25 ENCOUNTER — Ambulatory Visit (INDEPENDENT_AMBULATORY_CARE_PROVIDER_SITE_OTHER): Payer: Medicare Other | Admitting: Critical Care Medicine

## 2019-04-25 VITALS — BP 132/77 | HR 77 | Temp 98.1°F | Resp 18

## 2019-04-25 VITALS — BP 128/70 | HR 72 | Temp 98.0°F | Ht 64.0 in | Wt 120.0 lb

## 2019-04-25 DIAGNOSIS — J910 Malignant pleural effusion: Secondary | ICD-10-CM | POA: Diagnosis not present

## 2019-04-25 DIAGNOSIS — Z7901 Long term (current) use of anticoagulants: Secondary | ICD-10-CM

## 2019-04-25 DIAGNOSIS — I2609 Other pulmonary embolism with acute cor pulmonale: Secondary | ICD-10-CM | POA: Diagnosis not present

## 2019-04-25 DIAGNOSIS — Z5112 Encounter for antineoplastic immunotherapy: Secondary | ICD-10-CM | POA: Diagnosis not present

## 2019-04-25 DIAGNOSIS — C8440 Peripheral T-cell lymphoma, not classified, unspecified site: Secondary | ICD-10-CM

## 2019-04-25 DIAGNOSIS — Z7189 Other specified counseling: Secondary | ICD-10-CM

## 2019-04-25 MED ORDER — PEGFILGRASTIM-JMDB 6 MG/0.6ML ~~LOC~~ SOSY
6.0000 mg | PREFILLED_SYRINGE | Freq: Once | SUBCUTANEOUS | Status: AC
  Administered 2019-04-25: 16:00:00 6 mg via SUBCUTANEOUS

## 2019-04-25 MED ORDER — PEGFILGRASTIM-JMDB 6 MG/0.6ML ~~LOC~~ SOSY
PREFILLED_SYRINGE | SUBCUTANEOUS | Status: AC
  Filled 2019-04-25: qty 0.6

## 2019-04-25 NOTE — Patient Instructions (Addendum)
Thank you for visiting Dr. Carlis Abbott at [facility]. We recommend the following: Orders Placed This Encounter  Procedures  . DG Chest 2 View   Orders Placed This Encounter  Procedures  . DG Chest 2 View    Standing Status:   Future    Number of Occurrences:   1    Standing Expiration Date:   06/25/2020    Order Specific Question:   Reason for Exam (SYMPTOM  OR DIAGNOSIS REQUIRED)    Answer:   pleural effusion    Order Specific Question:   Preferred imaging location?    Answer:   Internal    Order Specific Question:   Radiology Contrast Protocol - do NOT remove file path    Answer:   [email or web address]    Chest xray in early February at [facility] and I will follow up with you afterwards with the results. Call us if you develop new pulmonary symptoms.    Return in about 6 months (around 10/24/2019).    Please do your part to reduce the spread of COVID-19.

## 2019-04-25 NOTE — Progress Notes (Signed)
The patient was seen in infusion as she was receiving cycle 1, day 1 of brentuximab, Cytoxan, and doxorubicin.  While receiving Cytoxan the patient reported that she was having itching.  She has a history of this happening several times of the day due to her diagnosis of an angioimmunoblastic T-cell lymphoma.  She reported that her itching was more pronounced than her baseline.  Her Cytoxan was paused and she was given Pepcid 20 mg IV x1.  Her itching worsened slightly.  She was then given prednisone 50 mg p.o. x1.  Her Cytoxan was restarted and she was able to complete the remainder of her infusion without any additional issues of concern.  Sandi Mealy, MHS, PA-C Physician Assistant

## 2019-04-25 NOTE — Progress Notes (Signed)
Synopsis: Referred in October 2020 for pleural effusion by Lajean Manes, MD.  Subjective:   PATIENT ID: Sarah [name] GENDER: female DOB: 07/23/1932, MRN: [id number]  Chief Complaint  Patient presents with   Follow-up    Patient is here to get an x-ray. Patient was supposed to have Pleurix placed and procedure didn't happen.    Sarah [name] is an 85 year old woman with a history of angioimmunoblastic T-cell lymphoma, MPE, and submassive PE who presents for follow-up. She had her port placed earlier this week and started chemo yesterday. IR was unable to place a pleurX due to lack of available pleural fluid.  She was on high-dose prednisone for several days prior to starting chemo.  She denies shortness of breath, coughing, or other complaints.  The last 2 nights her itching has improved to the point that she is able to sleep through the night.  She tolerated her first chemotherapy yesterday well.  She is back on her Eliquis with no bleeding or bruising complications from her port placement.    OV 11/23:  Sarah [name] is an 85 year old woman with a history of angioimmunoblastic T-cell lymphoma, MPE, and submassive PE who presents for follow-up.  She met with oncology last week, and is tentatively supposed to get a port and start chemotherapy after that.  She continues to complain of her most significant symptom being itching that prevents sleep.  She continues to take Zyrtec, prednisone, hydroxyzine, without relief.  She stopped taking Pepcid because it did not work.  She has been doing well on anticoagulation.  She denies shortness of breath, chest pain.  She has been losing weight.  Her daughter had to buy her new close.  Her appetite has been poor and she is on early satiety.  No significant nausea.  She wants to know if she can stop taking Breo-no history of asthma or tobacco abuse.  No significant bleeding on anticoagulation.  Sarah [name] was accompanied by her daughter again at  today's visit.   OV 03/13/2019: Sarah [name] is an 85 year old woman who was referred for follow-up of a pleural effusion and a submassive PE.  She is accompanied by [name].  She was evaluated in the emergency department last week after presenting with acute onset of shortness of breath and heart racing where she was found to have a large right-sided pleural effusion, small left pleural effusion, and submassive PE.  She was started on Lovenox in the hospital.  She underwent drainage of her right pleural effusion, without a significant change in her symptoms.  Since August she has had significant pruritus with a less noticeable rash, dry cough, abdominal distention, edema in her left arm and both legs, and reduced appetite.  She had undergone CT scanning with her primary care provider, who had noted adenopathy.  She is planning for an excisional biopsy with ENT next week.  Her cough has been bothersome, intermittent, sometimes mild, but episodically very severe and refractory.  She has not had sputum or hemoptysis.  During the same timeframe she has noticed pruritus that is severe enough to keep her awake at night and a rash.  She has tried cetirizine, which did not help her symptoms.  Before the PE, she did not have shortness of breath associated with her cough.  Since her hospitalization she has had reduced exercise tolerance, but before August was very active with no activity limitations.  She has not required oxygen since discharge from the hospital.  She has no  personal history of VTE or miscarriages.  Her mother had hemophilia, but there have been no clotting disorders in the family.  She has not had any recent surgeries, extensive travel, significant injuries, or immobility to explain her DVT and PE.  She has a history of breast cancer in 2013, status post lumpectomy with radiation.  She never had chemotherapy.  Past Medical History:  Diagnosis Date   Allergy    codeine, thiazides    Anxiety    new dx   Arthritis    Breast cancer ([place]) 03/14/12   bx=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,ER/PR=+,upper inner quad   Cancer ([facility])    breast   Cataract    DVT (deep venous thrombosis) ([place]) 02/2019   left leg   Dyspnea    Edema    both legs feet and toe, abdomen   Glaucoma    laser treated years ago   HOH (hard of hearing)    Hyperlipemia    Hypertension    Hypothyroidism    Pancreatic cyst    benign   PONV (postoperative nausea and vomiting)    Pulmonary embolism ([place]) 02/2019   bilateral    Radiation 06/11/2012-07/12/2012   17 sessions 4250 cGy, 3 sessions 750 cGy   Vertigo    Wears glasses    Wears partial dentures    partial upper     Family History  Problem Relation Age of Onset   Heart disease Father    Cancer Maternal Aunt        stomach     Past Surgical History:  Procedure Laterality Date   ABDOMINAL HYSTERECTOMY  1966   1/2 ovary left in    [place]   lumpectomy-lt   CATARACT EXTRACTION     b/l   COLONOSCOPY     EXCISION MASS NECK Left 03/17/2019    EXCISION MASS NECK (Left Neck)   EXCISION MASS NECK Left 03/17/2019   Procedure: EXCISION MASS NECK;  Surgeon: Helayne Seminole, MD;  Location: [facility];  Service: ENT;  Laterality: Left;   EYE SURGERY Bilateral    bilateral cataract removal   IR IMAGING GUIDED PORT INSERTION  04/23/2019   JOINT REPLACEMENT  2013   rt total knee   JOINT REPLACEMENT  1995   lt total knee   PARTIAL MASTECTOMY WITH NEEDLE LOCALIZATION  04/09/2012   Procedure: PARTIAL MASTECTOMY WITH NEEDLE LOCALIZATION;  Surgeon: Adin Hector, MD;  Location: [facility];  Service: General;  Laterality: Right;   SKIN BIOPSY Left 03/17/2019   LEFT THIGH   SKIN BIOPSY Left 03/17/2019   Procedure: Skin Biopsy Left Thigh;  Surgeon: Helayne Seminole, MD;  Location: [facility];  Service: ENT;  Laterality: Left;   TONSILLECTOMY     TOTAL KNEE  ARTHROPLASTY  08/16/2011   Procedure: TOTAL KNEE ARTHROPLASTY;  Surgeon: Ninetta Lights, MD;  Location: [facility];  Service: Orthopedics;  Laterality: Right;    Social History   Socioeconomic History   Marital status: Widowed    Spouse name: Not on file   Number of children: 2   Years of education: Not on file   Highest education level: Not on file  Occupational History   Occupation: Retired    Comment: Interior and spatial designer strain: Not on file   Food insecurity    Worry: Not on file    Inability: Not on file   Transportation needs  Medical: Not on file    Non-medical: Not on file  Tobacco Use   Smoking status: Never Smoker   Smokeless tobacco: Never Used  Substance and Sexual Activity   Alcohol use: No   Drug use: No   Sexual activity: Not Currently  Lifestyle   Physical activity    Days per week: Not on file    Minutes per session: Not on file   Stress: Not on file  Relationships   Social connections    Talks on phone: Not on file    Gets together: Not on file    Attends religious service: Not on file    Active member of club or organization: Not on file    Attends meetings of clubs or organizations: Not on file    Relationship status: Not on file   Intimate partner violence    Fear of current or ex partner: Not on file    Emotionally abused: Not on file    Physically abused: Not on file    Forced sexual activity: Not on file  Other Topics Concern   Not on file  Social History Narrative   Not on file     Allergies  Allergen Reactions   Codeine Other (See Comments)    Reaction not recalled   Latex    Thiazide-Type Diuretics Other (See Comments)    Blurry vision?? (patient stated she has macular degeneration)     Immunization History  Administered Date(s) Administered   Influenza, High Dose Seasonal PF 02/24/2017, 02/24/2019    Outpatient Medications Prior to Visit  Medication Sig Dispense Refill     amLODipine (NORVASC) 2.5 MG tablet Take 2.5-5 mg by mouth See admin instructions. 5 mg in the morning, 2.5 mg in the evening     apixaban (ELIQUIS) 5 MG TABS tablet Take 1 tablet (5 mg total) by mouth 2 (two) times daily. (Patient taking differently: Take 5 mg by mouth 2 (two) times daily. ) 60 tablet 11   Calcium Carbonate (CALCIUM 600 PO) Take 600 mg by mouth daily.     cetirizine (ZYRTEC) 10 MG tablet Take 10 mg by mouth daily.      Cholecalciferol (VITAMIN D) 50 MCG (2000 UT) tablet Take 2,000 Units by mouth 2 (two) times daily.     Cyanocobalamin (B-12 PO) Take by mouth.     Cyanocobalamin (VITAMIN B-12 PO) Take 3,000 mcg by mouth daily.      levothyroxine (SYNTHROID) 25 MCG tablet Take 25 mcg by mouth daily before breakfast.      lidocaine-prilocaine (EMLA) cream Apply to affected area once 30 g 3   Multiple Vitamins-Minerals (PRESERVISION AREDS PO) Take 1 capsule by mouth 2 (two) times daily.      ondansetron (ZOFRAN) 8 MG tablet Take 1 tablet (8 mg total) by mouth 2 (two) times daily as needed. Start on the third day after chemotherapy. 30 tablet 1   predniSONE (DELTASONE) 50 MG tablet Take 50 mg tablet po daily for 5 days starting day after treatment 5 tablet 4   prochlorperazine (COMPAZINE) 10 MG tablet Take 1 tablet (10 mg total) by mouth every 6 (six) hours as needed (Nausea or vomiting). 30 tablet 1   zolpidem (AMBIEN) 10 MG tablet Take 2.5 mg by mouth at bedtime.      fluticasone furoate-vilanterol (BREO ELLIPTA) 100-25 MCG/INH AEPB Inhale 1 puff into the lungs daily. (Patient not taking: Reported on 04/25/2019) 60 each 0   triamcinolone cream (KENALOG) 0.1 % Apply 1 application  topically 3 (three) times daily.      benzonatate (TESSALON) 200 MG capsule Take 1 capsule (200 mg total) by mouth 3 (three) times daily. 20 capsule 0   dexamethasone (DECADRON) 4 MG tablet Take 2 tablets by mouth once a day for 3 days after chemo. Take with food. 30 tablet 1   enoxaparin  (LOVENOX) 30 MG/0.3ML injection Inject 30 mg into the skin every 12 (twelve) hours.     hydrOXYzine (ATARAX/VISTARIL) 25 MG tablet Take 1 tablet (25 mg total) by mouth at bedtime as needed for itching. 30 tablet 0   No facility-administered medications prior to visit.     Review of Systems  Constitutional: Positive for chills and malaise/fatigue. Negative for diaphoresis, fever and weight loss.  HENT: Positive for congestion. Negative for ear pain and sore throat.   Eyes:       Right subconjunctival hemorrhage  Respiratory: Positive for cough. Negative for hemoptysis, sputum production, shortness of breath and wheezing.        Cough improved  Cardiovascular: Positive for leg swelling. Negative for chest pain and palpitations.  Gastrointestinal: Positive for nausea. Negative for abdominal pain and heartburn.       Reduced appetite  Genitourinary: Negative for frequency.  Musculoskeletal: Negative for joint pain and myalgias.  Skin: Positive for itching and rash.  Neurological: Positive for weakness. Negative for dizziness and headaches.  Endo/Heme/Allergies: Bruises/bleeds easily.  Psychiatric/Behavioral: Negative for depression. The patient is not nervous/anxious.      Objective:   Vitals:   04/25/19 1406  BP: 128/70  Pulse: 72  Temp: 98 F (36.7 C)  TempSrc: Temporal  SpO2: 98%  Weight: 120 lb (54.4 kg)  Height: _0  (1.626 m)   98% on  RA BMI Readings from Last 3 Encounters:  04/25/19 20.60 kg/m  04/21/19 19.17 kg/m  04/14/19 19.16 kg/m   Wt Readings from Last 3 Encounters:  04/25/19 120 lb (54.4 kg)  04/21/19 111 lb 11.2 oz (50.7 kg)  04/14/19 111 lb 9.6 oz (50.6 kg)    Physical Exam Vitals signs reviewed.  Constitutional:      Appearance: Normal appearance. She is not ill-appearing or toxic-appearing.  HENT:     Head: Normocephalic and atraumatic.     Nose:     Comments: Deferred due to masking requirement.    Mouth/Throat:     Comments: Deferred  due to masking requirement. Eyes:     General: No scleral icterus. Neck:     Musculoskeletal: Neck supple.  Cardiovascular:     Rate and Rhythm: Normal rate and regular rhythm.  Pulmonary:     Comments: Breathing comfortably on room air, no conversational dyspnea.  No witnessed coughing. Abdominal:     General: Abdomen is flat. There is no distension.     Palpations: Abdomen is soft.  Musculoskeletal:        General: No swelling or deformity.  Skin:    General: Skin is warm and dry.     Comments: Bandages on rash on her legs.  Neurological:     General: No focal deficit present.     Mental Status: She is alert.     Motor: No weakness.     Coordination: Coordination normal.  Psychiatric:        Mood and Affect: Mood normal.        Behavior: Behavior normal.      CBC    Component Value Date/Time   WBC 7.0 04/23/2019 1307   RBC 4.86  04/23/2019 1307   HGB 13.7 04/23/2019 1307   HGB 15.2 07/25/2016 1053   HCT 43.7 04/23/2019 1307   HCT 44.9 07/25/2016 1053   PLT 189 04/23/2019 1307   PLT 176 07/25/2016 1053   MCV 89.9 04/23/2019 1307   MCV 88.5 07/25/2016 1053   MCH 28.2 04/23/2019 1307   MCHC 31.4 04/23/2019 1307   RDW 14.9 04/23/2019 1307   RDW 13.7 07/25/2016 1053   LYMPHSABS 0.5 (L) 04/23/2019 1307   LYMPHSABS 0.9 07/25/2016 1053   MONOABS 0.8 04/23/2019 1307   MONOABS 0.4 07/25/2016 1053   EOSABS 0.5 04/23/2019 1307   EOSABS 0.1 07/25/2016 1053   BASOSABS 0.1 04/23/2019 1307   BASOSABS 0.1 07/25/2016 1053    CHEMISTRY Recent Labs  Lab 04/21/19 1515  NA 143  K 3.8  CL 102  CO2 30  GLUCOSE 112*  BUN 30*  CREATININE 0.83  CALCIUM 10.5*     Pleural fluid Cell count with differential-825 WBCs, 51% PMNs, 44% lymphs, 4% monocytes, 1% eosinophils Protein 3.5 (Serum 6.0) pH 7.9 LDH 107 (Serum 211) Glucose 107 Culture- NG Flow cytometry not performed as fluid appeared more inflammatory than like lymphoma per Path  RF <10 Anti-CCP 7 ANA  negative IgE 406  Chest Imaging- films reviewed: CTA chest 03/06/2019-PE in the left lower lobe and right PA extending into the lower and upper lobe segmental pulmonary arteries.  Moderate size right dependent pleural effusion with overlying compressive atelectasis and smaller left dependent pleural effusion.  Minimal mediastinal adenopathy, multiple subcentimeter axillary nodes bilaterally.  Subpleural anterior opacity on the right, mild groundglass opacities in the right middle lobe.  CXR 03/06/2019-minimal residual right pleural effusion, small left dependent pleural effusion.  Full re-expansion of the right lung post thoracentesis.  No obvious pulmonary masses or opacities.  Previous right breast surgical clips.  CXR, 2 view 03/13/2019-increasing right dependent pleural effusion, stable left pleural effusion  CT PET 04/08/2019-moderate right dependent pleural effusion, PET avid adenopathy in neck, chest, axilla, abdomen, and pelvis.  CXR, 2-view 04/25/2019-minimal residual right pleural fluid.  Right-sided Port-A-Cath in place.  Pulmonary Functions Testing Results: No flowsheet data found.  Pathology:  Pleural fluid 03/06/2019-no malignant cells, but inflammatory cells present  Cervical & left thigh lymph nodes 03/17/2019: Angioimmunoblastic T-cell lymphoma  Echocardiogram 03/06/2019: LVEF 60 to 36%, diastolic dysfunction.  Moderately enlarged RV with mildly reduced systolic function.  Mildly dilated RA, normal LA.  Trivial pericardial effusion.  Trace MR, severe TR.  Mildly elevated PASP.  LE ultrasound 03/07/2019- LLE DVT and posterior tibial veins, gastrocnemius veins.  Enlarged groin lymph nodes.     Assessment & Plan:     ICD-10-CM   1. Malignant pleural effusion  J91.0 DG Chest 2 View  2. Chronic anticoagulation  Z79.01   3. Other acute pulmonary embolism with acute cor pulmonale (HCC)  I26.09      Malignant pleural effusion, history of stage 3/4 non-Hodgkin's lymphoma  (angioimmunoblasticblastic T cell)-resolved with prednisone and her first round of chemotherapy -No intervention at this time -We will do a surveillance chest x-ray in 2 months or sooner if she develops symptoms.  Submassive PE, DVT in LLE -provoked by lymphoma.  Resolution of right heart strain on follow-up echo. -Continue Eliquis 5 mg twice daily for the duration of her malignancy; minimum of 6 months  -If she requires additional invasive procedures in the future, please stop Eliquis 2 days prior to the procedure and give Lovenox twice daily, skipping only the dose the morning  of the procedure.  Generally anticoagulation can be restarted the evening following the procedure, but should be verified with the person performing the procedure.   RTC in 6 months.  I will follow-up with her after her chest x-ray in 2 months.      Current Outpatient Medications:    amLODipine (NORVASC) 2.5 MG tablet, Take 2.5-5 mg by mouth See admin instructions. 5 mg in the morning, 2.5 mg in the evening, Disp: , Rfl:    apixaban (ELIQUIS) 5 MG TABS tablet, Take 1 tablet (5 mg total) by mouth 2 (two) times daily. (Patient taking differently: Take 5 mg by mouth 2 (two) times daily. ), Disp: 60 tablet, Rfl: 11   Calcium Carbonate (CALCIUM 600 PO), Take 600 mg by mouth daily., Disp: , Rfl:    cetirizine (ZYRTEC) 10 MG tablet, Take 10 mg by mouth daily. , Disp: , Rfl:    Cholecalciferol (VITAMIN D) 50 MCG (2000 UT) tablet, Take 2,000 Units by mouth 2 (two) times daily., Disp: , Rfl:    Cyanocobalamin (B-12 PO), Take by mouth., Disp: , Rfl:    Cyanocobalamin (VITAMIN B-12 PO), Take 3,000 mcg by mouth daily. , Disp: , Rfl:    levothyroxine (SYNTHROID) 25 MCG tablet, Take 25 mcg by mouth daily before breakfast. , Disp: , Rfl:    lidocaine-prilocaine (EMLA) cream, Apply to affected area once, Disp: 30 g, Rfl: 3   Multiple Vitamins-Minerals (PRESERVISION AREDS PO), Take 1 capsule by mouth 2 (two) times daily. ,  Disp: , Rfl:    ondansetron (ZOFRAN) 8 MG tablet, Take 1 tablet (8 mg total) by mouth 2 (two) times daily as needed. Start on the third day after chemotherapy., Disp: 30 tablet, Rfl: 1   predniSONE (DELTASONE) 50 MG tablet, Take 50 mg tablet po daily for 5 days starting day after treatment, Disp: 5 tablet, Rfl: 4   prochlorperazine (COMPAZINE) 10 MG tablet, Take 1 tablet (10 mg total) by mouth every 6 (six) hours as needed (Nausea or vomiting)., Disp: 30 tablet, Rfl: 1   zolpidem (AMBIEN) 10 MG tablet, Take 2.5 mg by mouth at bedtime. , Disp: , Rfl:    fluticasone furoate-vilanterol (BREO ELLIPTA) 100-25 MCG/INH AEPB, Inhale 1 puff into the lungs daily. (Patient not taking: Reported on 04/25/2019), Disp: 60 each, Rfl: 0   triamcinolone cream (KENALOG) 0.1 %, Apply 1 application topically 3 (three) times daily. , Disp: , Rfl:    Julian Hy, DO [facility] 04/25/2019 6:10 PM

## 2019-04-26 ENCOUNTER — Inpatient Hospital Stay: Payer: Medicare Other

## 2019-04-28 ENCOUNTER — Ambulatory Visit: Payer: Medicare Other

## 2019-04-28 ENCOUNTER — Telehealth: Payer: Self-pay | Admitting: *Deleted

## 2019-04-28 ENCOUNTER — Encounter: Payer: Self-pay | Admitting: *Deleted

## 2019-04-28 NOTE — Telephone Encounter (Signed)
Daughter [name] called office - requested letter of medical necessity  for mother to obtain cranial prosthesis for alopecia due to treatment for cancer. Letter prepared for MD signature. Daughter will pick up from [facility] 04/29/2019.

## 2019-04-29 NOTE — Telephone Encounter (Signed)
Attempted to call Sarah [name] but unable to reach and unable to leave a VM. Due to multiple attempts trying to reach her and unable to do so, per triage protocol encounter will be closed.

## 2019-05-07 NOTE — Progress Notes (Signed)
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 female who has been referred to [place] by Lajean Manes, MD for evaluation and management of Possible lymphoma . She is accompanied today by her daughter Sarah [name] . The pt reports that she is doing well overall.   Pending Surgical pathology from 03/17/2019 Dr.Manny will contact clinic about pathology   August 25th/27th she began coughing and sneezing. On Sept 3 went to doctors and they said it was allergies. She began having rashes (little red dots). Went to PCP and they prescribed prednisone and for several days and the medication helped. But as soon as she completed the prescription symptoms reoccurred.   She had a chest X Ray done because she was having a severe cough on 02/17/2019 revealing "bilateral effusions in this patient with history of breast cancer,of uncertain significance with question of right lower lobe pulmonary nodule, consider chest CT for further assessment. Atherosclerotic changes in the thoracic aorta."  Dr. Amy Martinique biopsied the rashes on her back 02/21/2019 and it was determined that it could be because of an allergy to medication. Dr.Stoneking stopped her blood pressure medication from September- October but ther rash still present. The itching started 1 to 2 months ago. The rash would come and go and was mostly on her back. She still has rash on her left thigh.  She is using triaconazole and cetrizine.  She has lumps behind ear and abdomin and she states that they have swollen twice the size within 1 and half months  She has no appetite and has changes in her  taste that began around August. She has changes in bowels. They are more loose and smaller In size. She also has no control over bowels.  She feels fatigued and it has increased since August.   She has a cough that starts in September  that has made her horse.   She had a CT scan on 02/27/2019 because she was having severe cough and her stomach was very extended. Revealing "1. Extensive lymphadenopathy throughout the chest, abdomen, and pelvis, with index lymph nodes identified above. Splenomegaly. Findings are most consistent with lymphoma with recurrent, metastatic breast malignancy less favored. 2. There is generally osteopenic appearance of the skeleton. There are numerous subtle hypodense lesions, particularly of the pelvis (e.g. Series 2, image 88) and select vertebral bodies, for example L4 (series 8, image 92). This is suspicious although not definitive for osseous metastatic disease. Characterization for metabolic activity by nuclear scintigraphic bone scan or PET-CT would be helpful to further evaluate. 3. Moderate right, small left pleural effusions with associated atelectasis or consolidation. Subpleural radiation fibrosis of the anterior right lung. 4. There is a new subpleural pulmonary nodule of the anterior right middle lobe measuring 6 mm (series 4, image 105), nonspecific. There is no obvious pleural thickening or nodularity definitive for metastatic disease. 5.  Small volume ascites. 6. There is a fluid attenuation lesion of the pancreatic uncinate measuring 2.4 x 1.3 cm (series 2, image 63), not substantially changed when compared to remote prior MR examination dated 02/23/2012 and likely sequelae of a prior pseudocyst versus incidental pancreatic IPMN. 7.  Cardiomegaly."  Hospitalized from 03/06/2019-03/08/2019. She presenting with acute shortness of breath, leg swelling,left hand swelling and some weight gain despite poor appetite. In ED, CTA chest revealed at least submassive PE,  moderate right pleural effusion, diffuse LAD concerning for lymphoma. Started on heparin drip. PCCM consulted for guidance. She had thoracocentesis with removal of 1200 cc cloudy exudative pleural fluid. Cardiology consulted for elevated which was thought to be demand ischemia from right heart strain in the setting of PE. Patient was transitioned to subcu Lovenox by pulmonology the next day and remained stable. Unusual appearance of tracheal air column at the thoracic inlet. Correlate with any history of swallowing difficulty or changes in voice with dedicated imaging with chest CT and or CT of the neck as indicated.  She has a nonhealing ulcer on her right tibialis anterior that starting weeping one week ago. Of note since the patient's last visit, pt has had CHEST XRAY - 2 VIEW (Accession [id number]) completed on 02/17/2019 with results revealing "Bilateral effusions in this patient with history of breast cancer,of uncertain significance with question of right lower lobepulmonary nodule, consider chest CT for furtherassessment.Unusual appearance of tracheal air column at the thoracic inlet.Correlate with any history of swallowing difficulty or changes in voice with dedicated imaging with chest CT and or CT of the neck as Indicated. Atherosclerotic changes in the thoracic aorta."  Of note since the patient's last visit, pt has had CT CHEST, ABDOMEN, AND PELVIS WITH CONTRAST (Accession [id number]) completed on 02/27/2019 with results revealing "1. Extensive lymphadenopathy throughout the chest, abdomen, and pelvis, with index lymph nodes identified above. Splenomegaly.Findings are most consistent with lymphoma with recurrent, metastatic breast malignancy less favored. 2. There is generally osteopenic appearance of the skeleton. There are numerous subtle hypodense lesions, particularly of the pelvis (e.g. Series 2, image 88) and select vertebral bodies, for example L4 (series 8, image 92). This is  suspicious although not definitive for osseous metastatic disease. Characterization for metabolic activity by nuclear scintigraphic bone scan or PET-CT would be helpful to further evaluate.  3. Moderate right, small left pleural effusions with associated atelectasis or consolidation. Subpleural radiation fibrosis of the anterior right lung.4. There is a new subpleural pulmonary nodule of the anterior right middle lobe measuring 6 mm (series 4, image 105), nonspecific. There is no obvious pleural thickening or nodularity definitive for metastatic disease.5.  Small volume ascites. 6. There is a fluid attenuation lesion of the pancreatic uncinate measuring 2.4 x 1.3 cm (series 2, image 63), not substantially changed when compared to remote prior MR examination dated 02/23/2012 and likely sequelae of a prior pseudocyst versus incidental pancreatic IPMN.7.  Cardiomegaly.8. Other chronic and incidental findings as detailed above. Aortic Atherosclerosis (ICD10-I70.0)."  Of note since the patient's last visit, pt has had PORTABLE CHEST 1 VIEW (Accession [id number]) completed on 03/06/2019 with results revealing "1. Moderate size right and small left pleural effusions appear not significantly changed since 02/27/19. 2. No new cardiopulmonary abnormality."  Of note since the patient's last visit, pt has had ECHOCARDIOGRAM  completed on 03/06/2019 with results revealing  "1. Left ventricular ejection fraction, by visual estimation, is 60 to 65%. The left ventricle has normal function. Normal left ventricular size. There is no left ventricular hypertrophy. 2. Left ventricular diastolic Doppler parameters are consistent with impaired relaxation pattern of LV diastolic filling. 3. Global right ventricle has mildly reduced systolic function.The right ventricular size is moderately enlarged. No increase in right ventricular wall thickness. 4. Left atrial size was normal.  5. Right atrial size was mildly dilated. 6.  Trivial pericardial effusion is present. 7. The mitral valve is normal in structure. Trace mitral valve regurgitation. No evidence of mitral stenosis. 8. The tricuspid valve is normal in structure. Tricuspid valve regurgitation severe. 9. The aortic valve is normal in structure. Aortic valve regurgitation was not visualized by color flow Doppler. Structurally normal aortic valve, with no evidence of sclerosis or stenosis.10. The pulmonic valve was normal in structure. Pulmonic valve regurgitation is mild by color flow Doppler.11. Mildly elevated pulmonary artery systolic pressure.12. The inferior vena cava is normal in size with greater than 50% respiratory variability, suggesting right atrial pressure of 3 mmHg."  Of note since the patient's last visit, pt has had CT ANGIOGRAPHY CHEST WITH CONTRAST (Accession [id number]) completed on 03/06/2019 with results revealing "1. Pulmonary embolus arising from the distal left main pulmonary artery with extension into multiple left lower lobe pulmonary arterial branches. There is also incompletely obstructing pulmonary embolus in the proximal left upper lobe pulmonary artery. More distal pulmonary embolus in the right lower lobe pulmonary artery. Positive for acute PE with CT evidence of right heart strain (RV/LV Ratio = 1.6) consistent with at least submassive (intermediate risk) PE. The presence of right heart strain has been associated with an increased risk of morbidity and mortality. Please activate Code PE by paging [phone]. 2. Sizable pleural effusion on the right with smaller pleural effusion on the left. Consolidation and compressive atelectasis throughout most of the right lower lobe. Milder atelectasis left base. Nodular opacities on the right, likely metastatic foci.3.  Stable adenopathy compared to 1 week prior.4. Enlargement of spleen, incompletely visualized but documented CT 1 week prior. Concern for potential lymphoma. 5. Aortic atherosclerosis. No  aneurysm or dissection evident. Foci of coronary artery calcification noted."  Most recent lab results (03/06/2019) of CBC is as follows: all values are WNL except for Platelets at 72, Eosinophils Absolute at 0.6,Abs Immature Granulocytes at 0.08 .  On review of systems, pt reports rashes, digestive issues and denies back pain, abdominal pain and any other symptoms.   On PMHx the pt reports Hypothyroidism, MCI, Hypercholesterolemia, Tracheal anomaly, Hypertension, Pulmonary nodule, Atherosclerosis of aorta, anxiety, gait disorder, primary insomnia.   INTERVAL HISTORY:  Sarah [name] is a 85 y.o. female here for evaluation and management of Angioimmunoblastic T-cell lymphoma and toxicity check. We are joined today by her daughter. The patient's last visit with [place] was on 04/21/2019. The pt reports that she is doing well overall.  The pt reports she feels normal. She no longer is experiencing itching and the previously reported rashes have resolved.  Her fatigue has improved as well and she no longer has a cough.   She is not using the triamcinolone cream.  She wakes up several times during the night to use the rest room and from the burning sensation caused by an ulcer on her mid lower left leg.  Of note since the patient's last visit, pt has had IR IMAGING GUIDED PORT INSERTION on 04/23/2019.  Lab results today (05/08/19) of CBC w/diff and CMP is as follows: all values are WNL except for WBC at 10.6, RDW at 15.9, Neutro Abs at 8.5, Lymphs Abs at 0.5, Basophils Absolute at 0.2, Abs Immature Granulocytes at 0.47, AST at 12, LDH at 219.  On review of systems, pt reports feeling normal and denies coughing, rashes, lumps or bumps, abdominal pain and any other symptoms.   MEDICAL HISTORY:  Past Medical History:  Diagnosis  Date  . Allergy    codeine, thiazides  . Anxiety    new dx  . Arthritis   . Breast cancer ([facility]) 03/14/12   bx=right breast=Ductal carcinoma in situ  w/calcifications,ER/PR=+,upper inner quad  . Cancer ([facility])    breast  . Cataract   . DVT (deep venous thrombosis) ([facility]) 02/2019   left leg  . Dyspnea   . Edema    both legs feet and toe, abdomen  . Glaucoma    laser treated years ago  . HOH (hard of hearing)   . Hyperlipemia   . Hypertension   . Hypothyroidism   . Pancreatic cyst    benign  . PONV (postoperative nausea and vomiting)   . Pulmonary embolism ([facility]) 02/2019   bilateral   . Radiation 06/11/2012-07/12/2012   17 sessions 4250 cGy, 3 sessions 750 cGy  . Vertigo   . Wears glasses   . Wears partial dentures    partial upper     SURGICAL HISTORY: Past Surgical History:  Procedure Laterality Date  . ABDOMINAL HYSTERECTOMY  1966   1/2 ovary left in   . BREAST SURGERY  1992   lumpectomy-lt  . CATARACT EXTRACTION     b/l  . COLONOSCOPY    . EXCISION MASS NECK Left 03/17/2019    EXCISION MASS NECK (Left Neck)  . EXCISION MASS NECK Left 03/17/2019   Procedure: EXCISION MASS NECK;  Surgeon: Helayne Seminole, MD;  Location: [facility];  Service: ENT;  Laterality: Left;  . EYE SURGERY Bilateral    bilateral cataract removal  . IR IMAGING GUIDED PORT INSERTION  04/23/2019  . JOINT REPLACEMENT  2013   rt total knee  . JOINT REPLACEMENT  1995   lt total knee  . PARTIAL MASTECTOMY WITH NEEDLE LOCALIZATION  04/09/2012   Procedure: PARTIAL MASTECTOMY WITH NEEDLE LOCALIZATION;  Surgeon: Adin Hector, MD;  Location: [facility];  Service: General;  Laterality: Right;  . SKIN BIOPSY Left 03/17/2019   LEFT THIGH  . SKIN BIOPSY Left 03/17/2019   Procedure: Skin Biopsy Left Thigh;  Surgeon: Helayne Seminole, MD;  Location: [facility];  Service: ENT;  Laterality: Left;  . TONSILLECTOMY    . TOTAL KNEE ARTHROPLASTY  08/16/2011   Procedure: TOTAL KNEE ARTHROPLASTY;  Surgeon: Ninetta Lights, MD;  Location: [facility];  Service: Orthopedics;  Laterality: Right;     SOCIAL HISTORY: Social History   Socioeconomic  History  . Marital status: Widowed    Spouse name: Not on file  . Number of children: 2  . Years of education: Not on file  . Highest education level: Not on file  Occupational History  . Occupation: Retired    Comment: Community education officer   Tobacco Use  . Smoking status: Never Smoker  . Smokeless tobacco: Never Used  Substance and Sexual Activity  . Alcohol use: No  . Drug use: No  . Sexual activity: Not Currently  Other Topics Concern  . Not on file  Social History Narrative  . Not on file   Social Determinants of Health   Financial Resource Strain:   . Difficulty of Paying Living Expenses: Not on file  Food Insecurity:   . Worried About Charity fundraiser in the Last Year: Not on file  . Ran Out of Food in the Last Year: Not on file  Transportation Needs:   . Lack of Transportation (Medical): Not on file  . Lack of Transportation (Non-Medical): Not on  file  Physical Activity:   . Days of Exercise per Week: Not on file  . Minutes of Exercise per Session: Not on file  Stress:   . Feeling of Stress : Not on file  Social Connections:   . Frequency of Communication with Friends and Family: Not on file  . Frequency of Social Gatherings with Friends and Family: Not on file  . Attends Religious Services: Not on file  . Active Member of Clubs or Organizations: Not on file  . Attends Archivist Meetings: Not on file  . Marital Status: Not on file  Intimate Partner Violence:   . Fear of Current or Ex-Partner: Not on file  . Emotionally Abused: Not on file  . Physically Abused: Not on file  . Sexually Abused: Not on file     FAMILY HISTORY: Family History  Problem Relation Age of Onset  . Heart disease Father   . Cancer Maternal Aunt        stomach     ALLERGIES:   is allergic to codeine; latex; and thiazide-type diuretics.   MEDICATIONS:  Current Outpatient Medications  Medication Sig Dispense Refill  . amLODipine (NORVASC) 2.5 MG tablet Take 2.5-5 mg  by mouth See admin instructions. 5 mg in the morning, 2.5 mg in the evening    . apixaban (ELIQUIS) 5 MG TABS tablet Take 1 tablet (5 mg total) by mouth 2 (two) times daily. (Patient taking differently: Take 5 mg by mouth 2 (two) times daily. ) 60 tablet 11  . Calcium Carbonate (CALCIUM 600 PO) Take 600 mg by mouth daily.    . cetirizine (ZYRTEC) 10 MG tablet Take 10 mg by mouth daily.     . Cholecalciferol (VITAMIN D) 50 MCG (2000 UT) tablet Take 2,000 Units by mouth 2 (two) times daily.    . Cyanocobalamin (B-12 PO) Take by mouth.    . Cyanocobalamin (VITAMIN B-12 PO) Take 3,000 mcg by mouth daily.     . fluticasone furoate-vilanterol (BREO ELLIPTA) 100-25 MCG/INH AEPB Inhale 1 puff into the lungs daily. (Patient not taking: Reported on 04/25/2019) 60 each 0  . levothyroxine (SYNTHROID) 25 MCG tablet Take 25 mcg by mouth daily before breakfast.     . lidocaine-prilocaine (EMLA) cream Apply to affected area once 30 g 3  . Multiple Vitamins-Minerals (PRESERVISION AREDS PO) Take 1 capsule by mouth 2 (two) times daily.     . ondansetron (ZOFRAN) 8 MG tablet Take 1 tablet (8 mg total) by mouth 2 (two) times daily as needed. Start on the third day after chemotherapy. 30 tablet 1  . predniSONE (DELTASONE) 50 MG tablet Take 50 mg tablet po daily for 5 days starting day after treatment 5 tablet 4  . prochlorperazine (COMPAZINE) 10 MG tablet Take 1 tablet (10 mg total) by mouth every 6 (six) hours as needed (Nausea or vomiting). 30 tablet 1  . triamcinolone cream (KENALOG) 0.1 % Apply 1 application topically 3 (three) times daily.     Marland Kitchen zolpidem (AMBIEN) 10 MG tablet Take 2.5 mg by mouth at bedtime.      No current facility-administered medications for this visit.     REVIEW OF SYSTEMS:   A 10+ POINT REVIEW OF SYSTEMS WAS OBTAINED including neurology, dermatology, psychiatry, cardiac, respiratory, lymph, extremities, GI, GU, Musculoskeletal, constitutional, breasts, reproductive, HEENT.  All pertinent  positives are noted in the HPI.  All others are negative.   PHYSICAL EXAMINATION: Exam was given in a chair   ECOG FS:2 -  Symptomatic, <50% confined to bed  Vitals:   05/08/19 1052  BP: 137/89  Pulse: 84  Resp: 18  Temp: 98.2 F (36.8 C)  SpO2: 100%   Wt Readings from Last 3 Encounters:  05/08/19 109 lb 6.4 oz (49.6 kg)  04/25/19 120 lb (54.4 kg)  04/21/19 111 lb 11.2 oz (50.7 kg)   Body mass index is 18.78 kg/m.    GENERAL:alert, in no acute distress and comfortable SKIN: no acute rashes, no significant lesions EYES: conjunctiva are pink and non-injected, sclera anicteric OROPHARYNX: MMM, no exudates, no oropharyngeal erythema or ulceration NECK: supple, no JVD LYMPH:  no palpable lymphadenopathy in the cervical, axillary or inguinal regions LUNGS: clear to auscultation b/l with normal respiratory effort HEART: regular rate & rhythm ABDOMEN:  normoactive bowel sounds , non tender, not distended. Extremity: 2 + (left), 1 + (right)  edema. nonhealing ulcer on mid lower left leg accompanied by some cellulitis, swelling, and discoloration.  PSYCH: alert & oriented x 3 with fluent speech NEURO: no focal motor/sensory deficits    LABORATORY DATA:  I have reviewed the data as listed  CBC Latest Ref Rng & Units 05/08/2019 04/23/2019 04/21/2019  WBC 4.0 - 10.5 K/uL 10.6(H) 7.0 6.4  Hemoglobin 12.0 - 15.0 g/dL 12.8 13.7 13.1  Hematocrit 36.0 - 46.0 % 40.2 43.7 41.5  Platelets 150 - 400 K/uL 177 189 210    CMP Latest Ref Rng & Units 05/08/2019 04/21/2019 03/27/2019  Glucose 70 - 99 mg/dL 84 112(H) 98  BUN 8 - 23 mg/dL 22 30(H) 19  Creatinine 0.44 - 1.00 mg/dL 0.71 0.83 0.75  Sodium 135 - 145 mmol/L 142 143 142  Potassium 3.5 - 5.1 mmol/L 4.3 3.8 4.4  Chloride 98 - 111 mmol/L 105 102 105  CO2 22 - 32 mmol/L 26 30 26   Calcium 8.9 - 10.3 mg/dL 9.5 10.5(H) 10.0  Total Protein 6.5 - 8.1 g/dL 7.0 6.8 6.3(L)  Total Bilirubin 0.3 - 1.2 mg/dL 0.4 0.3 0.5  Alkaline Phos 38 - 126  U/L 111 84 104  AST 15 - 41 U/L 12(L) 14(L) 14(L)  ALT 0 - 44 U/L 8 18 12    Component     Latest Ref Rng & Units 03/27/2019  Hepatitis B Surface Ag     NON REACTIVE NON REACTIVE  Hep B Core Total Ab     NON REACTIVE NON REACTIVE  HCV Ab     NON REACTIVE NON REACTIVE  LDH     98 - 192 U/L 252 (H)     04/08/2019 NM PET Image Initial (PI) Skull Base To Thigh (Accession [id number])  04/04/2019 PATHOLOGY   04/07/2019 ECHOCARDIOGRAM  03/28/2019 PATHOLOGY   03/06/2019 CT ANGIOGRAPHY CHEST WITH CONTRAST (Accession [id number])  04/07/2019 ECHOCARDIOGRAM  03/06/2019 ECHOCARDIOGRAM     03/06/2019 PORTABLE CHEST 1 VIEW (Accession [id number])    02/27/2019 CT CHEST, ABDOMEN, AND PELVIS WITH CONTRAST (Accession [id number])    02/17/2019 CHEST XRAY - 2 VIEW (Accession [id number])    RADIOGRAPHIC STUDIES: I have personally reviewed the radiological images as listed and agreed with the findings in the report. DG Chest 2 View  Result Date: 04/25/2019 CLINICAL DATA:  History of breast cancer with malignant pleural effusion. EXAM: CHEST - 2 VIEW COMPARISON:  PA and lateral chest 03/13/2019. PET CT scan 04/08/2019. FINDINGS: Right IJ approach port a catheter is in place with its tip in the lower superior vena cava. Surgical clips right breast are noted. The patient has very small bilateral pleural  effusions, larger on the right. Lungs are clear. Heart size is normal. Aortic atherosclerosis is noted. IMPRESSION: Very small bilateral pleural effusions have markedly decreased compared to the prior exams. No new abnormality. Electronically Signed   By: Inge Rise M.D.   On: 04/25/2019 14:29   NM PET Image Initial (PI) Skull Base To Thigh  Result Date: 04/08/2019 CLINICAL DATA:  Initial treatment strategy for large B-cell lymphoma. EXAM: NUCLEAR MEDICINE PET SKULL BASE TO THIGH TECHNIQUE: 6.0 mCi F-18 FDG was injected intravenously. Full-ring PET imaging was performed from the skull  base to thigh after the radiotracer. CT data was obtained and used for attenuation correction and anatomic localization. Fasting blood glucose: 86 mg/dl COMPARISON:  Multiple exams, including CT scans from 03/06/2019 and 02/27/2019 FINDINGS: Mediastinal blood pool activity: SUV max 1.8 Liver activity: SUV max 2.8 NECK: Posterior nasopharyngeal hypermetabolic activity, maximum SUV 7.6 on the left and 6.2 on the right. Lingual tonsillar activity maximum SUV 7.7 (Deauville 5). Hypermetabolic bilateral superficial parotid, level IIa, level IIb, level V, level Ia, level Ib, level III, and level IV hypermetabolic adenopathy noted. A left level IIa lymph node measuring 1.2 cm in short axis on image 28/4 has maximum SUV of 9.3, Deauville 5. An index left level Roman numeral 4 lymph node measuring 1.1 cm in short axis on image 38/4 has maximum SUV of 5.0, Deauville 4. Bilateral thyroid activity is present, but is greater on the enlarged left thyroid lobe, maximum SUV of 6.2, Deauville 4. The right thyroid lobe has maximum SUV of 3.6, Deauville 4. Incidental CT findings: Bilateral common carotid atherosclerotic calcification. CHEST: Bilateral hypermetabolic axillary, supraclavicular, right paratracheal, left paratracheal, AP window, bilateral hilar, right infrahilar, and subcarinal adenopathy. Right paratracheal node 1.4 cm in short axis on image 53/4, maximum SUV 7.6, Deauville 5. Right hilar lymph node 1.1 cm in short axis on image 64/4, maximum SUV 7.2, Deauville 5. Subcarinal lymph node approximately 1.3 cm in short axis on image 71/4, maximum SUV 6.1, Deauville 4. Incidental CT findings: Large right and small left pleural effusions with passive atelectasis. Coronary, aortic arch, and branch vessel atherosclerotic vascular disease. Moderate cardiomegaly. Small pericardial effusion. Right lower lobe anterior subpleural nodule 0.6 cm at its base, image 85/4, not appreciably hypermetabolic, stable in size. This was  previously reported on chest CT as right middle lobe but I believe that due to volume loss this is actually in the right lower lobe. ABDOMEN/PELVIS: Hypermetabolic right gastric, gastrohepatic ligament, porta hepatis, peripancreatic, retroperitoneal, mesenteric, common iliac, external iliac, and inguinal adenopathy identified. Index peripancreatic node measuring 1.2 cm in short axis on image 103/4 has maximum SUV of 9.0, Deauville L5. Portacaval node measuring 2.0 cm in short axis on image 108/4 has a maximum SUV of 5.2, De[place]ville 4. Conglomerate left periaortic adenopathy measuring 2.3 cm in short axis on image 116/4 has maximum SUV of 3.3, De[place]ville 4. A left common iliac lymph node measuring 0.9 cm in short axis on image 133/4 has maximum SUV of 4.9, Deauville 4. A left inguinal lymph node measuring 1.2 cm in short axis on image 165/4 has maximum SUV of 3.8, De[place]ville 4. The spleen measures 11.4 by 7.0 by 9.1 cm (volume = 380 cm^3) and has metabolic uptake diffusely mildly higher than the liver, with the typical maximum SUV of 3.7, Deauville 4. Incidental CT findings: Aortoiliac atherosclerotic vascular disease. Mild mesenteric edema. Large left renal cyst. Fullness of both renal collecting systems. Punctate 1-2 mm left kidney lower pole calculi. Trace pelvic  ascites. SKELETON: Minimal heterogeneity of marrow activity without definite focal hypermetabolic lesion identified. The subtle hypodensities in the vertebra and bony pelvis are not discernibly hypermetabolic. Incidental CT findings: Degenerative grade 1 anterolisthesis at L4-5 and L5-S1. Degenerative subcortical cyst formation in the anterior superior right acetabulum. IMPRESSION: 1. Widespread hypermetabolic adenopathy in the neck, chest, and abdomen/pelvis, primarily in the Deauville 4 and Deauville 5 range. There is also hypermetabolic activity along the posterior nasopharynx in lingual tonsillar regions potentially indicating sites of involvement. 2.  The subtle hypodensities in the spine and bony pelvis are not appreciably hypermetabolic may be incidental, surveillance is suggested. 3. Bilateral thyroid activity but especially on the left. Probably from thyroiditis. 4. A 6 mm subpleural nodule anteriorly in the right lower lobe not appreciably hypermetabolic but is below sensitive PET-CT size thresholds and merits surveillance. 5. Mildly accentuated diffuse splenic activity without splenomegaly or focal splenic lesion identified. 6. Other imaging findings of potential clinical significance: Aortic Atherosclerosis (ICD10-I70.0). Coronary atherosclerosis. Large right and small left pleural effusions. Moderate cardiomegaly. Small pericardial effusion. Mild mesenteric edema. Large left renal cyst. Fullness of both renal collecting systems with suspected nonobstructive left nephrolithiasis. Degenerative grade 1 anterolisthesis at L4-5 at L5-S1. Trace pelvic ascites. Electronically Signed   By: Van Clines M.D.   On: 04/08/2019 13:39   IR IMAGING GUIDED PORT INSERTION  Result Date: 04/23/2019 INDICATION: 85 year old female with angio immuno blastic T-cell lymphoma in need of durable venous access for chemotherapy. EXAM: IMPLANTED PORT A CATH PLACEMENT WITH ULTRASOUND AND FLUOROSCOPIC GUIDANCE MEDICATIONS: 2 g Ancef; The antibiotic was administered within an appropriate time interval prior to skin puncture. ANESTHESIA/SEDATION: Versed 2 mg IV; Fentanyl 100 mcg IV; Moderate Sedation Time:  15 minutes The patient was continuously monitored during the procedure by the interventional radiology nurse under my direct supervision. FLUOROSCOPY TIME:  0 minutes, 6 seconds (1 mGy) COMPLICATIONS: None immediate. PROCEDURE: The right neck and chest was prepped with chlorhexidine, and draped in the usual sterile fashion using maximum barrier technique (cap and mask, sterile gown, sterile gloves, large sterile sheet, hand hygiene and cutaneous antiseptic). Local  anesthesia was attained by infiltration with 1% lidocaine with epinephrine. Ultrasound demonstrated patency of the right internal jugular vein, and this was documented with an image. Under real-time ultrasound guidance, this vein was accessed with a 21 gauge micropuncture needle and image documentation was performed. A small dermatotomy was made at the access site with an 11 scalpel. A 0.018" wire was advanced into the SVC and the access needle exchanged for a 36F micropuncture vascular sheath. The 0.018" wire was then removed and a 0.035" wire advanced into the IVC. An appropriate location for the subcutaneous reservoir was selected below the clavicle and an incision was made through the skin and underlying soft tissues. The subcutaneous tissues were then dissected using a combination of blunt and sharp surgical technique and a pocket was formed. A single lumen power injectable portacatheter was then tunneled through the subcutaneous tissues from the pocket to the dermatotomy and the port reservoir placed within the subcutaneous pocket. The venous access site was then serially dilated and a peel away vascular sheath placed over the wire. The wire was removed and the port catheter advanced into position under fluoroscopic guidance. The catheter tip is positioned in the superior cavoatrial junction. This was documented with a spot image. The portacatheter was then tested and found to flush and aspirate well. The port was flushed with saline followed by 100 units/mL heparinized saline. The pocket  was then closed in two layers using first subdermal inverted interrupted absorbable sutures followed by a running subcuticular suture. The epidermis was then sealed with Dermabond. The dermatotomy at the venous access site was also closed with Dermabond. IMPRESSION: Successful placement of a right IJ approach Power Port with ultrasound and fluoroscopic guidance. The catheter is ready for use. Electronically Signed   By: Jacqulynn Cadet M.D.   On: 04/23/2019 18:17     ASSESSMENT & PLAN:   Sarah [name] is a 85 y.o. female with:  1. 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 SKIN, LEFT THIGH, BIOPSY: - Involvement by angioimmunoblastic T-cell lymphoma." -04/07/2019 Echocardiogram - normal EF -NM PET Image Initial (PI) Skull Base To Thigh (Accession [id number]) completed on 04/08/2019 with results revealing "1. Widespread hypermetabolic adenopathy in the neck, chest, and abdomen/pelvis, primarily in the Deauville 4 and Deauville 5 range. There is also hypermetabolic activity along the posterior nasopharynx in lingual tonsillar regions potentially indicating sites of involvement. 2. The subtle hypodensities in the spine and bony pelvis are not appreciably hypermetabolic may be incidental, surveillance is suggested. 3. Bilateral thyroid activity but especially on the left. Probably from thyroiditis. 4. A 6 mm subpleural nodule anteriorly in the right lower lobe not appreciably hypermetabolic but is below sensitive PET-CT size thresholds and merits surveillance. 5. Mildly accentuated diffuse splenic activity without splenomegaly or focal splenic lesion identified. 6. Other imaging findings of potential clinical significance: Aortic Atherosclerosis (ICD10-I70.0). Coronary atherosclerosis. Large right and small left pleural effusions. Moderate cardiomegaly. Small pericardial effusion. Mild mesenteric edema. Large left renal cyst. Fullness of both renal collecting systems with suspected nonobstructive left nephrolithiasis. Degenerative grade 1 anterolisthesis at L4-5 at L5-S1. Trace pelvic ascites."   2. Pulmonary Embolism -extensive .likely related to extensive malignancy  3. H/o Breast Cancer  -The patient had bilateral diagnostic  mammography at [facility] 07/11/2011. This  showed some  calcifications in the right  breast, which seemed a little bit more  prominent than prior. In the left breast  there was an area of possible  architectural distortion. However left  breast ultrasound the same day showed  no abnormality. 6 month followup was  suggested, and on 02/28/2012 the  patient again had bilateral diagnostic  mammography, now with right  ultrasonography. The microcalcifications  in the right breast appeared increased.  Ultrasound showed a hypoechoic lesion  measuring 5 mm, with no associated  shadowing. This had been previously  noted and appeared unchanged. Anterior  to that there was a small hypoechoic  mass measuring 4 mm in diameter.  There was felt to be suspicious, and on  03/14/2012 the patient underwent biopsy  of the right breast mass, showing ([id number]  [id number]) ductal carcinoma in situ,  intermediate grade, estrogen receptor  100% and progesterone receptor 100%  positive.   -Bilateral breast MRI was obtained  03/26/2012. This showed only post  biopsy changes in the right breast,  associated with an area of non-masslike  enhancement measuring 2.4 cm. The  left breast was unremarkable, and there  was no enlarged axillary or internal  mammary adenopathy noted.  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    PLAN: -Discussed pt labwork today, 05/08/19; all values are WNL except for WBC  at 10.6, RDW at 15.9, Neutro Abs at 8.5, Lymphs Abs at 0.5, Basophils Absolute at 0.2, Abs Immature Granulocytes at 0.47, AST at 12, LDH at 219. -Discussed 05/08/19 WBC at 10/6 -Discussed 05/08/19 Calcium 9.5 -Discussed 05/08/19 RDW at 15.9 -Discussed that she is tolerating treatment and will continue her current A-CHP regimen with same dose reductions -Discussed nonhealing ulcer on left leg (mid lower left leg). It is painful to tough  and there is some cellulitis, discoloration and edema. Advised that she should have it assessed by wound care and I will provide the referral. I will also prescribe antibiotics (doxycycline) and ointment (Mupirocin) . Recommended keeping her legs elevated and to use knee high compression socks when being active. Advised that she can use sport compression socks (15 and 10 to begin with) if she is unable to put on her current compression socks. -referral to wound care clinic.  FOLLOW UP: -referral to wound care clinic urgently for non healing left leg ulcer ASAP -f/u as per scheduled appointment for C2 of treatment with labs, portflush and MD visit on 12/23  The total time spent in the appt was 25 minutes and more than 50% was on counseling and direct patient cares.  All of the patient's questions were answered with apparent satisfaction. The patient knows to call the clinic with any problems, questions or concerns.   Sullivan Lone MD MS AAHIVMS [facility] Hematology/Oncology Physician [facility]  (Office):       [phone] (Work cell):  [phone] (Fax):           [phone]  05/07/2019 4:31 PM  I, Scot Dock, am acting as a scribe for Dr. Sullivan Lone.   .I have reviewed the above documentation for accuracy and completeness, and I agree with the above. Brunetta Genera MD

## 2019-05-08 ENCOUNTER — Telehealth: Payer: Self-pay | Admitting: *Deleted

## 2019-05-08 ENCOUNTER — Telehealth: Payer: Self-pay | Admitting: Hematology

## 2019-05-08 ENCOUNTER — Inpatient Hospital Stay: Payer: Medicare Other

## 2019-05-08 ENCOUNTER — Other Ambulatory Visit: Payer: Self-pay

## 2019-05-08 ENCOUNTER — Inpatient Hospital Stay (HOSPITAL_BASED_OUTPATIENT_CLINIC_OR_DEPARTMENT_OTHER): Payer: Medicare Other | Admitting: Hematology

## 2019-05-08 VITALS — BP 137/89 | HR 84 | Temp 98.2°F | Resp 18 | Ht 64.0 in | Wt 109.4 lb

## 2019-05-08 DIAGNOSIS — L97929 Non-pressure chronic ulcer of unspecified part of left lower leg with unspecified severity: Secondary | ICD-10-CM

## 2019-05-08 DIAGNOSIS — C8440 Peripheral T-cell lymphoma, not classified, unspecified site: Secondary | ICD-10-CM

## 2019-05-08 DIAGNOSIS — Z7189 Other specified counseling: Secondary | ICD-10-CM

## 2019-05-08 DIAGNOSIS — Z5112 Encounter for antineoplastic immunotherapy: Secondary | ICD-10-CM | POA: Diagnosis not present

## 2019-05-08 DIAGNOSIS — C8598 Non-Hodgkin lymphoma, unspecified, lymph nodes of multiple sites: Secondary | ICD-10-CM

## 2019-05-08 LAB — CBC WITH DIFFERENTIAL/PLATELET
Abs Immature Granulocytes: 0.47 10*3/uL — ABNORMAL HIGH (ref 0.00–0.07)
Basophils Absolute: 0.2 10*3/uL — ABNORMAL HIGH (ref 0.0–0.1)
Basophils Relative: 1 %
Eosinophils Absolute: 0.1 10*3/uL (ref 0.0–0.5)
Eosinophils Relative: 1 %
HCT: 40.2 % (ref 36.0–46.0)
Hemoglobin: 12.8 g/dL (ref 12.0–15.0)
Immature Granulocytes: 4 %
Lymphocytes Relative: 5 %
Lymphs Abs: 0.5 10*3/uL — ABNORMAL LOW (ref 0.7–4.0)
MCH: 28 pg (ref 26.0–34.0)
MCHC: 31.8 g/dL (ref 30.0–36.0)
MCV: 88 fL (ref 80.0–100.0)
Monocytes Absolute: 0.9 10*3/uL (ref 0.1–1.0)
Monocytes Relative: 8 %
Neutro Abs: 8.5 10*3/uL — ABNORMAL HIGH (ref 1.7–7.7)
Neutrophils Relative %: 81 %
Platelets: 177 10*3/uL (ref 150–400)
RBC: 4.57 MIL/uL (ref 3.87–5.11)
RDW: 15.9 % — ABNORMAL HIGH (ref 11.5–15.5)
WBC: 10.6 10*3/uL — ABNORMAL HIGH (ref 4.0–10.5)
nRBC: 0 % (ref 0.0–0.2)

## 2019-05-08 LAB — CMP (CANCER CENTER ONLY)
ALT: 8 U/L (ref 0–44)
AST: 12 U/L — ABNORMAL LOW (ref 15–41)
Albumin: 3.6 g/dL (ref 3.5–5.0)
Alkaline Phosphatase: 111 U/L (ref 38–126)
Anion gap: 11 (ref 5–15)
BUN: 22 mg/dL (ref 8–23)
CO2: 26 mmol/L (ref 22–32)
Calcium: 9.5 mg/dL (ref 8.9–10.3)
Chloride: 105 mmol/L (ref 98–111)
Creatinine: 0.71 mg/dL (ref 0.44–1.00)
GFR, Est AFR Am: >60 mL/min (ref >60)
GFR, Estimated: >60 mL/min (ref >60)
Glucose, Bld: 84 mg/dL (ref 70–99)
Potassium: 4.3 mmol/L (ref 3.5–5.1)
Sodium: 142 mmol/L (ref 135–145)
Total Bilirubin: 0.4 mg/dL (ref 0.3–1.2)
Total Protein: 7 g/dL (ref 6.5–8.1)

## 2019-05-08 LAB — LACTATE DEHYDROGENASE: LDH: 219 U/L — ABNORMAL HIGH (ref 98–192)

## 2019-05-08 MED ORDER — MUPIROCIN 2 % EX OINT: 1.0000 "application " | TOPICAL_OINTMENT | Freq: Two times a day (BID) | CUTANEOUS | 0 refills | Status: DC

## 2019-05-08 MED ORDER — HEPARIN SOD (PORK) LOCK FLUSH 100 UNIT/ML IV SOLN
500.0000 [IU] | Freq: Once | INTRAVENOUS | Status: AC
  Administered 2019-05-08: 500 [IU] via INTRAVENOUS
  Filled 2019-05-08: qty 5

## 2019-05-08 MED ORDER — DOXYCYCLINE HYCLATE 100 MG PO TABS: 100.0000 mg | ORAL_TABLET | Freq: Two times a day (BID) | ORAL | 0 refills | Status: AC

## 2019-05-08 MED ORDER — SODIUM CHLORIDE 0.9% FLUSH
10.0000 mL | INTRAVENOUS | Status: DC | PRN
  Administered 2019-05-08: 10 mL via INTRAVENOUS
  Filled 2019-05-08: qty 10

## 2019-05-08 NOTE — Telephone Encounter (Signed)
Appts were scheduled per 12/17 los. Printed avs per patient request.

## 2019-05-08 NOTE — Telephone Encounter (Signed)
Patient daughter - Sarah [name] - called. [name] asked to verify/clarify mother's appts for next week - reviewed and she verbalized understanding. She also wanted to verify that Bactroban was for mother's leg wound and asked how to apply it. She states the directions state to use in nose. Verified with Dr.Kale that ointment is for leg wound - error made on directions when presecription was entered. Advised [name] that ointment is for mother's leg wound. She verbalized understanding of all information.

## 2019-05-14 ENCOUNTER — Encounter: Payer: Self-pay | Admitting: Hematology

## 2019-05-14 ENCOUNTER — Inpatient Hospital Stay: Payer: Medicare Other

## 2019-05-14 ENCOUNTER — Other Ambulatory Visit: Payer: Self-pay | Admitting: Medical

## 2019-05-14 ENCOUNTER — Other Ambulatory Visit: Payer: Self-pay

## 2019-05-14 ENCOUNTER — Inpatient Hospital Stay (HOSPITAL_BASED_OUTPATIENT_CLINIC_OR_DEPARTMENT_OTHER): Payer: Medicare Other | Admitting: Hematology

## 2019-05-14 VITALS — BP 142/87 | HR 81 | Temp 98.2°F | Resp 18 | Ht 64.0 in | Wt 111.7 lb

## 2019-05-14 DIAGNOSIS — C8440 Peripheral T-cell lymphoma, not classified, unspecified site: Secondary | ICD-10-CM

## 2019-05-14 DIAGNOSIS — Z5112 Encounter for antineoplastic immunotherapy: Secondary | ICD-10-CM | POA: Diagnosis not present

## 2019-05-14 DIAGNOSIS — Z7189 Other specified counseling: Secondary | ICD-10-CM

## 2019-05-14 DIAGNOSIS — L97929 Non-pressure chronic ulcer of unspecified part of left lower leg with unspecified severity: Secondary | ICD-10-CM

## 2019-05-14 DIAGNOSIS — Z95828 Presence of other vascular implants and grafts: Secondary | ICD-10-CM

## 2019-05-14 DIAGNOSIS — Z5111 Encounter for antineoplastic chemotherapy: Secondary | ICD-10-CM | POA: Diagnosis not present

## 2019-05-14 LAB — CMP (CANCER CENTER ONLY)
ALT: 9 U/L (ref 0–44)
AST: 12 U/L — ABNORMAL LOW (ref 15–41)
Albumin: 3.5 g/dL (ref 3.5–5.0)
Alkaline Phosphatase: 86 U/L (ref 38–126)
Anion gap: 9 (ref 5–15)
BUN: 30 mg/dL — ABNORMAL HIGH (ref 8–23)
CO2: 29 mmol/L (ref 22–32)
Calcium: 9.9 mg/dL (ref 8.9–10.3)
Chloride: 102 mmol/L (ref 98–111)
Creatinine: 0.71 mg/dL (ref 0.44–1.00)
GFR, Est AFR Am: >60 mL/min (ref >60)
GFR, Estimated: >60 mL/min (ref >60)
Glucose, Bld: 96 mg/dL (ref 70–99)
Potassium: 4.1 mmol/L (ref 3.5–5.1)
Sodium: 140 mmol/L (ref 135–145)
Total Bilirubin: 0.4 mg/dL (ref 0.3–1.2)
Total Protein: 6.8 g/dL (ref 6.5–8.1)

## 2019-05-14 LAB — CBC WITH DIFFERENTIAL/PLATELET
Abs Immature Granulocytes: 0.12 10*3/uL — ABNORMAL HIGH (ref 0.00–0.07)
Basophils Absolute: 0.1 10*3/uL (ref 0.0–0.1)
Basophils Relative: 1 %
Eosinophils Absolute: 0.1 10*3/uL (ref 0.0–0.5)
Eosinophils Relative: 1 %
HCT: 38.9 % (ref 36.0–46.0)
Hemoglobin: 12.5 g/dL (ref 12.0–15.0)
Immature Granulocytes: 2 %
Lymphocytes Relative: 7 %
Lymphs Abs: 0.5 10*3/uL — ABNORMAL LOW (ref 0.7–4.0)
MCH: 28.6 pg (ref 26.0–34.0)
MCHC: 32.1 g/dL (ref 30.0–36.0)
MCV: 89 fL (ref 80.0–100.0)
Monocytes Absolute: 0.9 10*3/uL (ref 0.1–1.0)
Monocytes Relative: 12 %
Neutro Abs: 6 10*3/uL (ref 1.7–7.7)
Neutrophils Relative %: 77 %
Platelets: 249 10*3/uL (ref 150–400)
RBC: 4.37 MIL/uL (ref 3.87–5.11)
RDW: 15.9 % — ABNORMAL HIGH (ref 11.5–15.5)
WBC: 7.7 10*3/uL (ref 4.0–10.5)
nRBC: 0 % (ref 0.0–0.2)

## 2019-05-14 MED ORDER — PREDNISONE 50 MG PO TABS
ORAL_TABLET | ORAL | Status: AC
  Filled 2019-05-14: qty 1

## 2019-05-14 MED ORDER — DIPHENHYDRAMINE HCL 50 MG/ML IJ SOLN
INTRAMUSCULAR | Status: AC
  Filled 2019-05-14: qty 1

## 2019-05-14 MED ORDER — GABAPENTIN 100 MG PO CAPS: 200.0000 mg | ORAL_CAPSULE | Freq: Every day | ORAL | 2 refills | Status: DC

## 2019-05-14 MED ORDER — SODIUM CHLORIDE 0.9 % IV SOLN
400.0000 mg/m2 | Freq: Once | INTRAVENOUS | Status: AC
  Administered 2019-05-14: 620 mg via INTRAVENOUS
  Filled 2019-05-14: qty 31

## 2019-05-14 MED ORDER — PALONOSETRON HCL INJECTION 0.25 MG/5ML
0.2500 mg | Freq: Once | INTRAVENOUS | Status: AC
  Administered 2019-05-14: 0.25 mg via INTRAVENOUS

## 2019-05-14 MED ORDER — PALONOSETRON HCL INJECTION 0.25 MG/5ML
INTRAVENOUS | Status: AC
  Filled 2019-05-14: qty 5

## 2019-05-14 MED ORDER — SODIUM CHLORIDE 0.9 % IV SOLN
Freq: Once | INTRAVENOUS | Status: AC
  Filled 2019-05-14: qty 5

## 2019-05-14 MED ORDER — DOXORUBICIN HCL CHEMO IV INJECTION 2 MG/ML
25.0000 mg/m2 | Freq: Once | INTRAVENOUS | Status: AC
  Administered 2019-05-14: 40 mg via INTRAVENOUS
  Filled 2019-05-14: qty 20

## 2019-05-14 MED ORDER — ACETAMINOPHEN 325 MG PO TABS
ORAL_TABLET | ORAL | Status: AC
  Filled 2019-05-14: qty 2

## 2019-05-14 MED ORDER — DIPHENHYDRAMINE HCL 50 MG/ML IJ SOLN
50.0000 mg | Freq: Once | INTRAMUSCULAR | Status: AC
  Administered 2019-05-14: 50 mg via INTRAVENOUS

## 2019-05-14 MED ORDER — SODIUM CHLORIDE 0.9 % IV SOLN
1.2000 mg/kg | Freq: Once | INTRAVENOUS | Status: AC
  Administered 2019-05-14: 65 mg via INTRAVENOUS
  Filled 2019-05-14: qty 13

## 2019-05-14 MED ORDER — SODIUM CHLORIDE 0.9% FLUSH
10.0000 mL | INTRAVENOUS | Status: DC | PRN
  Administered 2019-05-14: 10 mL
  Filled 2019-05-14: qty 10

## 2019-05-14 MED ORDER — PREDNISONE 50 MG PO TABS
50.0000 mg | ORAL_TABLET | Freq: Once | ORAL | Status: AC
  Administered 2019-05-14: 50 mg via ORAL

## 2019-05-14 MED ORDER — HEPARIN SOD (PORK) LOCK FLUSH 100 UNIT/ML IV SOLN
500.0000 [IU] | Freq: Once | INTRAVENOUS | Status: AC | PRN
  Administered 2019-05-14: 500 [IU]
  Filled 2019-05-14: qty 5

## 2019-05-14 MED ORDER — SODIUM CHLORIDE 0.9% FLUSH
10.0000 mL | INTRAVENOUS | Status: DC | PRN
  Administered 2019-05-14: 10 mL via INTRAVENOUS
  Filled 2019-05-14: qty 10

## 2019-05-14 MED ORDER — SODIUM CHLORIDE 0.9 % IV SOLN
Freq: Once | INTRAVENOUS | Status: AC
  Filled 2019-05-14: qty 250

## 2019-05-14 MED ORDER — FAMOTIDINE IN NACL 20-0.9 MG/50ML-% IV SOLN
20.0000 mg | Freq: Once | INTRAVENOUS | Status: AC
  Administered 2019-05-14: 20 mg via INTRAVENOUS

## 2019-05-14 MED ORDER — ACETAMINOPHEN 325 MG PO TABS
650.0000 mg | ORAL_TABLET | Freq: Once | ORAL | Status: AC
  Administered 2019-05-14: 650 mg via ORAL

## 2019-05-14 MED ORDER — FAMOTIDINE IN NACL 20-0.9 MG/50ML-% IV SOLN
INTRAVENOUS | Status: AC
  Filled 2019-05-14: qty 50

## 2019-05-14 NOTE — Progress Notes (Signed)
Pt states she is itching all over, states this happened before.  Cytoxan infusion paused, V. Tanner PA notified, order received for prednisone 50 mg po.  Prednisone given, ok to restart cytoxan per V. Tanner PA.

## 2019-05-14 NOTE — Patient Instructions (Signed)

## 2019-05-14 NOTE — Progress Notes (Signed)
HEMATOLOGY/ONCOLOGY CLINIC NOTE  Date of Service: 05/14/2019  Patient Care Team: Lajean Manes, MD as PCP - General (Internal Medicine) Buford Dresser, MD as PCP - Cardiology (Cardiology)  REFERRING PHYSICIAN: Lajean Manes, MD  CHIEF COMPLAINTS/PURPOSE OF CONSULTATION:  Newly Diagnosed Angioimmunoblastic T cell lymphoma      HISTORY OF PRESENTING ILLNESS:   Sarah [name] is a wonderful 85 y.o. female who has been referred to [place] by Lajean Manes, MD for evaluation and management of Possible lymphoma . She is accompanied today by [name] . The pt reports that she is doing well overall.   Pending Surgical pathology from 03/17/2019 Dr.Manny will contact clinic about pathology   August 25th/27th she began coughing and sneezing. On Sept 3 went to doctors and they said it was allergies. She began having rashes (little red dots). Went to PCP and they prescribed prednisone and for several days and the medication helped. But as soon as she completed the prescription symptoms reoccurred.   She had a chest X Ray done because she was having a severe cough on 02/17/2019 revealing "bilateral effusions in this patient with history of breast cancer,of uncertain significance with question of right lower lobe pulmonary nodule, consider chest CT for further assessment. Atherosclerotic changes in the thoracic aorta."  Dr. Amy Martinique biopsied the rashes on her back 02/21/2019 and it was determined that it could be because of an allergy to medication. Dr.Stoneking stopped her blood pressure medication from September- October but ther rash still present. The itching started 1 to 2 months ago. The rash would come and go and was mostly on her back. She still has rash on her left thigh.  She is using triaconazole and cetrizine.  She has lumps behind ear and abdomin and she states that they have swollen twice the size within 1 and half months  She has no appetite and has changes in  her taste that began around August. She has changes in bowels. They are more loose and smaller In size. She also has no control over bowels.  She feels fatigued and it has increased since August.   She has a cough that starts in September  that has made her horse.   She had a CT scan on 02/27/2019 because she was having severe cough and her stomach was very extended. Revealing "1. Extensive lymphadenopathy throughout the chest, abdomen, and pelvis, with index lymph nodes identified above. Splenomegaly. Findings are most consistent with lymphoma with recurrent, metastatic breast malignancy less favored. 2. There is generally osteopenic appearance of the skeleton. There are numerous subtle hypodense lesions, particularly of the pelvis (e.g. Series 2, image 88) and select vertebral bodies, for example L4 (series 8, image 92). This is suspicious although not definitive for osseous metastatic disease. Characterization for metabolic activity by nuclear scintigraphic bone scan or PET-CT would be helpful to further evaluate. 3. Moderate right, small left pleural effusions with associated atelectasis or consolidation. Subpleural radiation fibrosis of the anterior right lung. 4. There is a new subpleural pulmonary nodule of the anterior right middle lobe measuring 6 mm (series 4, image 105), nonspecific. There is no obvious pleural thickening or nodularity definitive for metastatic disease. 5.  Small volume ascites. 6. There is a fluid attenuation lesion of the pancreatic uncinate measuring 2.4 x 1.3 cm (series 2, image 63), not substantially changed when compared to remote prior MR examination dated 02/23/2012 and likely sequelae of a prior pseudocyst versus incidental pancreatic IPMN. 7.  Cardiomegaly."  Hospitalized from 03/06/2019-03/08/2019. She presenting with acute shortness of breath, leg swelling,left hand swelling and some weight gain despite poor appetite. In ED, CTA chest revealed at least submassive PE,  moderate right pleural effusion, diffuse LAD concerning for lymphoma. Started on heparin drip. PCCM consulted for guidance. She had thoracocentesis with removal of 1200 cc cloudy exudative pleural fluid. Cardiology consulted for elevated which was thought to be demand ischemia from right heart strain in the setting of PE. Patient was transitioned to subcu Lovenox by pulmonology the next day and remained stable. Unusual appearance of tracheal air column at the thoracic inlet. Correlate with any history of swallowing difficulty or changes in voice with dedicated imaging with chest CT and or CT of the neck as indicated.  She has a nonhealing ulcer on her right tibialis anterior that starting weeping one week ago. Of note since the patient's last visit, pt has had CHEST XRAY - 2 VIEW (Accession [id number]) completed on 02/17/2019 with results revealing "Bilateral effusions in this patient with history of breast cancer,of uncertain significance with question of right lower lobepulmonary nodule, consider chest CT for furtherassessment.Unusual appearance of tracheal air column at the thoracic inlet.Correlate with any history of swallowing difficulty or changes in voice with dedicated imaging with chest CT and or CT of the neck as Indicated. Atherosclerotic changes in the thoracic aorta."  Of note since the patient's last visit, pt has had CT CHEST, ABDOMEN, AND PELVIS WITH CONTRAST (Accession [id number]) completed on 02/27/2019 with results revealing "1. Extensive lymphadenopathy throughout the chest, abdomen, and pelvis, with index lymph nodes identified above. Splenomegaly.Findings are most consistent with lymphoma with recurrent, metastatic breast malignancy less favored. 2. There is generally osteopenic appearance of the skeleton. There are numerous subtle hypodense lesions, particularly of the pelvis (e.g. Series 2, image 88) and select vertebral bodies, for example L4 (series 8, image 92). This is  suspicious although not definitive for osseous metastatic disease. Characterization for metabolic activity by nuclear scintigraphic bone scan or PET-CT would be helpful to further evaluate.  3. Moderate right, small left pleural effusions with associated atelectasis or consolidation. Subpleural radiation fibrosis of the anterior right lung.4. There is a new subpleural pulmonary nodule of the anterior right middle lobe measuring 6 mm (series 4, image 105), nonspecific. There is no obvious pleural thickening or nodularity definitive for metastatic disease.5.  Small volume ascites. 6. There is a fluid attenuation lesion of the pancreatic uncinate measuring 2.4 x 1.3 cm (series 2, image 63), not substantially changed when compared to remote prior MR examination dated 02/23/2012 and likely sequelae of a prior pseudocyst versus incidental pancreatic IPMN.7.  Cardiomegaly.8. Other chronic and incidental findings as detailed above. Aortic Atherosclerosis (ICD10-I70.0)."  Of note since the patient's last visit, pt has had PORTABLE CHEST 1 VIEW (Accession [id number]) completed on 03/06/2019 with results revealing "1. Moderate size right and small left pleural effusions appear not significantly changed since 02/27/19. 2. No new cardiopulmonary abnormality."  Of note since the patient's last visit, pt has had ECHOCARDIOGRAM  completed on 03/06/2019 with results revealing  "1. Left ventricular ejection fraction, by visual estimation, is 60 to 65%. The left ventricle has normal function. Normal left ventricular size. There is no left ventricular hypertrophy. 2. Left ventricular diastolic Doppler parameters are consistent with impaired relaxation pattern of LV diastolic filling. 3. Global right ventricle has mildly reduced systolic function.The right ventricular size is moderately enlarged. No increase in right ventricular wall thickness. 4. Left atrial size was normal. 5. Right  atrial size was mildly dilated. 6.  Trivial pericardial effusion is present. 7. The mitral valve is normal in structure. Trace mitral valve regurgitation. No evidence of mitral stenosis. 8. The tricuspid valve is normal in structure. Tricuspid valve regurgitation severe. 9. The aortic valve is normal in structure. Aortic valve regurgitation was not visualized by color flow Doppler. Structurally normal aortic valve, with no evidence of sclerosis or stenosis.10. The pulmonic valve was normal in structure. Pulmonic valve regurgitation is mild by color flow Doppler.11. Mildly elevated pulmonary artery systolic pressure.12. The inferior vena cava is normal in size with greater than 50% respiratory variability, suggesting right atrial pressure of 3 mmHg."  Of note since the patient's last visit, pt has had CT ANGIOGRAPHY CHEST WITH CONTRAST (Accession [id number]) completed on 03/06/2019 with results revealing "1. Pulmonary embolus arising from the distal left main pulmonary artery with extension into multiple left lower lobe pulmonary arterial branches. There is also incompletely obstructing pulmonary embolus in the proximal left upper lobe pulmonary artery. More distal pulmonary embolus in the right lower lobe pulmonary artery. Positive for acute PE with CT evidence of right heart strain (RV/LV Ratio = 1.6) consistent with at least submassive (intermediate risk) PE. The presence of right heart strain has been associated with an increased risk of morbidity and mortality. Please activate Code PE by paging [phone]. 2. Sizable pleural effusion on the right with smaller pleural effusion on the left. Consolidation and compressive atelectasis throughout most of the right lower lobe. Milder atelectasis left base. Nodular opacities on the right, likely metastatic foci.3.  Stable adenopathy compared to 1 week prior.4. Enlargement of spleen, incompletely visualized but documented CT 1 week prior. Concern for potential lymphoma. 5. Aortic atherosclerosis. No  aneurysm or dissection evident. Foci of coronary artery calcification noted."  Most recent lab results (03/06/2019) of CBC is as follows: all values are WNL except for Platelets at 72, Eosinophils Absolute at 0.6,Abs Immature Granulocytes at 0.08 .  On review of systems, pt reports rashes, digestive issues and denies back pain, abdominal pain and any other symptoms.   On PMHx the pt reports Hypothyroidism, MCI, Hypercholesterolemia, Tracheal anomaly, Hypertension, Pulmonary nodule, Atherosclerosis of aorta, anxiety, gait disorder, primary insomnia.   INTERVAL HISTORY:  Sarah [name] is a 85 y.o. female here for evaluation and management of Angioimmunoblastic T-cell lymphoma. The patient's last visit with [place] was on 05/08/2019. The pt reports that she is doing well overall.  The pt reports she has an appointment with wound care soon.   She is drinking two bottles of Ensure. Her appetite is not well.  She also is have some difficulties with memory.   Lab results today (05/14/19) of CBC w/diff and CMP is as follows: all values are WNL except for RDW at 15.9, Lymphs Abs at 0.5, Abs immature granulocytes at 0.12, BUN at 30, AST 12.  On review of systems, pt denies abdominal pain and any other symptoms.    MEDICAL HISTORY:  Past Medical History:  Diagnosis Date  . Allergy    codeine, thiazides  . Anxiety    new dx  . Arthritis   . Breast cancer ([place]) 03/14/12   bx=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,ER/PR=+,upper inner quad  . Cancer ([place])    breast  . Cataract   . DVT (deep venous thrombosis) ([place]) 02/2019   left leg  . Dyspnea   . Edema    both legs feet and toe, abdomen  . Glaucoma    laser treated years  ago  . HOH (hard of hearing)   . Hyperlipemia   . Hypertension   . Hypothyroidism   . Pancreatic cyst    benign  . PONV (postoperative nausea and vomiting)   . Pulmonary embolism ([place]) 02/2019   bilateral   . Radiation 06/11/2012-07/12/2012   17  sessions 4250 cGy, 3 sessions 750 cGy  . Vertigo   . Wears glasses   . Wears partial dentures    partial upper     SURGICAL HISTORY: Past Surgical History:  Procedure Laterality Date  . ABDOMINAL HYSTERECTOMY  1966   1/2 ovary left in   . BREAST SURGERY  1992   lumpectomy-lt  . CATARACT EXTRACTION     b/l  . COLONOSCOPY    . EXCISION MASS NECK Left 03/17/2019    EXCISION MASS NECK (Left Neck)  . EXCISION MASS NECK Left 03/17/2019   Procedure: EXCISION MASS NECK;  Surgeon: Helayne Seminole, MD;  Location: [facility];  Service: ENT;  Laterality: Left;  . EYE SURGERY Bilateral    bilateral cataract removal  . IR IMAGING GUIDED PORT INSERTION  04/23/2019  . JOINT REPLACEMENT  2013   rt total knee  . JOINT REPLACEMENT  1995   lt total knee  . PARTIAL MASTECTOMY WITH NEEDLE LOCALIZATION  04/09/2012   Procedure: PARTIAL MASTECTOMY WITH NEEDLE LOCALIZATION;  Surgeon: Adin Hector, MD;  Location: [facility];  Service: General;  Laterality: Right;  . SKIN BIOPSY Left 03/17/2019   LEFT THIGH  . SKIN BIOPSY Left 03/17/2019   Procedure: Skin Biopsy Left Thigh;  Surgeon: Helayne Seminole, MD;  Location: [facility];  Service: ENT;  Laterality: Left;  . TONSILLECTOMY    . TOTAL KNEE ARTHROPLASTY  08/16/2011   Procedure: TOTAL KNEE ARTHROPLASTY;  Surgeon: Ninetta Lights, MD;  Location: [facility];  Service: Orthopedics;  Laterality: Right;     SOCIAL HISTORY: Social History   Socioeconomic History  . Marital status: Widowed    Spouse name: Not on file  . Number of children: 2  . Years of education: Not on file  . Highest education level: Not on file  Occupational History  . Occupation: Retired    Comment: Community education officer   Tobacco Use  . Smoking status: Never Smoker  . Smokeless tobacco: Never Used  Substance and Sexual Activity  . Alcohol use: No  . Drug use: No  . Sexual activity: Not Currently  Other Topics Concern  . Not on file  Social History Narrative    . Not on file   Social Determinants of Health   Financial Resource Strain:   . Difficulty of Paying Living Expenses: Not on file  Food Insecurity:   . Worried About Charity fundraiser in the Last Year: Not on file  . Ran Out of Food in the Last Year: Not on file  Transportation Needs:   . Lack of Transportation (Medical): Not on file  . Lack of Transportation (Non-Medical): Not on file  Physical Activity:   . Days of Exercise per Week: Not on file  . Minutes of Exercise per Session: Not on file  Stress:   . Feeling of Stress : Not on file  Social Connections:   . Frequency of Communication with Friends and Family: Not on file  . Frequency of Social Gatherings with Friends and Family: Not on file  . Attends Religious Services: Not on file  . Active Member of Clubs or Organizations: Not on file  .  Attends Archivist Meetings: Not on file  . Marital Status: Not on file  Intimate Partner Violence:   . Fear of Current or Ex-Partner: Not on file  . Emotionally Abused: Not on file  . Physically Abused: Not on file  . Sexually Abused: Not on file     FAMILY HISTORY: Family History  Problem Relation Age of Onset  . Heart disease Father   . Cancer Maternal Aunt        stomach     ALLERGIES:   is allergic to codeine; latex; and thiazide-type diuretics.   MEDICATIONS:  Current Outpatient Medications  Medication Sig Dispense Refill  . amLODipine (NORVASC) 2.5 MG tablet Take 2.5-5 mg by mouth See admin instructions. 5 mg in the morning, 2.5 mg in the evening    . apixaban (ELIQUIS) 5 MG TABS tablet Take 1 tablet (5 mg total) by mouth 2 (two) times daily. (Patient taking differently: Take 5 mg by mouth 2 (two) times daily. ) 60 tablet 11  . Calcium Carbonate (CALCIUM 600 PO) Take 600 mg by mouth daily.    . cetirizine (ZYRTEC) 10 MG tablet Take 10 mg by mouth daily.     . Cholecalciferol (VITAMIN D) 50 MCG (2000 UT) tablet Take 2,000 Units by mouth 2 (two) times  daily.    . Cyanocobalamin (B-12 PO) Take by mouth.    . Cyanocobalamin (VITAMIN B-12 PO) Take 3,000 mcg by mouth daily.     Marland Kitchen doxycycline (VIBRA-TABS) 100 MG tablet Take 1 tablet (100 mg total) by mouth 2 (two) times daily for 7 days. Take over the counter probiotics with antibiotic to reduce risk of diarrhea 14 tablet 0  . fluticasone furoate-vilanterol (BREO ELLIPTA) 100-25 MCG/INH AEPB Inhale 1 puff into the lungs daily. (Patient not taking: Reported on 04/25/2019) 60 each 0  . levothyroxine (SYNTHROID) 25 MCG tablet Take 25 mcg by mouth daily before breakfast.     . lidocaine-prilocaine (EMLA) cream Apply to affected area once 30 g 3  . Multiple Vitamins-Minerals (PRESERVISION AREDS PO) Take 1 capsule by mouth 2 (two) times daily.     . mupirocin ointment (BACTROBAN) 2 % Place 1 application into the nose 2 (two) times daily. 22 g 0  . ondansetron (ZOFRAN) 8 MG tablet Take 1 tablet (8 mg total) by mouth 2 (two) times daily as needed. Start on the third day after chemotherapy. 30 tablet 1  . predniSONE (DELTASONE) 50 MG tablet Take 50 mg tablet po daily for 5 days starting day after treatment 5 tablet 4  . prochlorperazine (COMPAZINE) 10 MG tablet Take 1 tablet (10 mg total) by mouth every 6 (six) hours as needed (Nausea or vomiting). 30 tablet 1  . triamcinolone cream (KENALOG) 0.1 % Apply 1 application topically 3 (three) times daily.     Marland Kitchen zolpidem (AMBIEN) 10 MG tablet Take 2.5 mg by mouth at bedtime.      No current facility-administered medications for this visit.     REVIEW OF SYSTEMS:   A 10+ POINT REVIEW OF SYSTEMS WAS OBTAINED including neurology, dermatology, psychiatry, cardiac, respiratory, lymph, extremities, GI, GU, Musculoskeletal, constitutional, breasts, reproductive, HEENT.  All pertinent positives are noted in the HPI.  All others are negative.    PHYSICAL EXAMINATION: ECOG FS:1 - Symptomatic but completely ambulatory  Vitals:   05/14/19 1131  BP: (!) 142/87  Pulse:  81  Resp: 18  Temp: 98.2 F (36.8 C)  SpO2: 99%   Wt Readings from Last 3  Encounters:  05/14/19 111 lb 11.2 oz (50.7 kg)  05/08/19 109 lb 6.4 oz (49.6 kg)  04/25/19 120 lb (54.4 kg)   Body mass index is 19.17 kg/m.    GENERAL:alert, in no acute distress and comfortable SKIN: no acute rashes, no significant lesions EYES: conjunctivitis  OROPHARYNX: MMM, no exudates, no oropharyngeal erythema or ulceration NECK: supple, no JVD LYMPH:  no palpable lymphadenopathy in the cervical, axillary or inguinal regions LUNGS: clear to auscultation b/l with normal respiratory effort HEART: regular rate & rhythm ABDOMEN:  normoactive bowel sounds , non tender, not distended. Extremity: Unhealing ulcer on mid lower left leg accompanied by some cellulitis, swelling, and discoloration.  PSYCH: alert & oriented x 3 with fluent speech NEURO: no focal motor/sensory deficits    LABORATORY DATA:  I have reviewed the data as listed  CBC Latest Ref Rng & Units 05/14/2019 05/08/2019 04/23/2019  WBC 4.0 - 10.5 K/uL 7.7 10.6(H) 7.0  Hemoglobin 12.0 - 15.0 g/dL 12.5 12.8 13.7  Hematocrit 36.0 - 46.0 % 38.9 40.2 43.7  Platelets 150 - 400 K/uL 249 177 189    CMP Latest Ref Rng & Units 05/14/2019 05/08/2019 04/21/2019  Glucose 70 - 99 mg/dL 96 84 112(H)  BUN 8 - 23 mg/dL 30(H) 22 30(H)  Creatinine 0.44 - 1.00 mg/dL 0.71 0.71 0.83  Sodium 135 - 145 mmol/L 140 142 143  Potassium 3.5 - 5.1 mmol/L 4.1 4.3 3.8  Chloride 98 - 111 mmol/L 102 105 102  CO2 22 - 32 mmol/L 29 26 30   Calcium 8.9 - 10.3 mg/dL 9.9 9.5 10.5(H)  Total Protein 6.5 - 8.1 g/dL 6.8 7.0 6.8  Total Bilirubin 0.3 - 1.2 mg/dL 0.4 0.4 0.3  Alkaline Phos 38 - 126 U/L 86 111 84  AST 15 - 41 U/L 12(L) 12(L) 14(L)  ALT 0 - 44 U/L 9 8 18    Component     Latest Ref Rng & Units 03/27/2019  Hepatitis B Surface Ag     NON REACTIVE NON REACTIVE  Hep B Core Total Ab     NON REACTIVE NON REACTIVE  HCV Ab     NON REACTIVE NON REACTIVE  LDH      98 - 192 U/L 252 (H)     04/08/2019 NM PET Image Initial (PI) Skull Base To Thigh (Accession [id number])  04/04/2019 PATHOLOGY   04/07/2019 ECHOCARDIOGRAM  03/28/2019 PATHOLOGY   03/06/2019 CT ANGIOGRAPHY CHEST WITH CONTRAST (Accession [id number])  04/07/2019 ECHOCARDIOGRAM  03/06/2019 ECHOCARDIOGRAM     03/06/2019 PORTABLE CHEST 1 VIEW (Accession [id number])    02/27/2019 CT CHEST, ABDOMEN, AND PELVIS WITH CONTRAST (Accession [id number])    02/17/2019 CHEST XRAY - 2 VIEW (Accession [id number])    RADIOGRAPHIC STUDIES: I have personally reviewed the radiological images as listed and agreed with the findings in the report. DG Chest 2 View  Result Date: 04/25/2019 CLINICAL DATA:  History of breast cancer with malignant pleural effusion. EXAM: CHEST - 2 VIEW COMPARISON:  PA and lateral chest 03/13/2019. PET CT scan 04/08/2019. FINDINGS: Right IJ approach port a catheter is in place with its tip in the lower superior vena cava. Surgical clips right breast are noted. The patient has very small bilateral pleural effusions, larger on the right. Lungs are clear. Heart size is normal. Aortic atherosclerosis is noted. IMPRESSION: Very small bilateral pleural effusions have markedly decreased compared to the prior exams. No new abnormality. Electronically Signed   By: Inge Rise M.D.   On: 04/25/2019 14:29  IR IMAGING GUIDED PORT INSERTION  Result Date: 04/23/2019 INDICATION: 85 year old female with angio immuno blastic T-cell lymphoma in need of durable venous access for chemotherapy. EXAM: IMPLANTED PORT A CATH PLACEMENT WITH ULTRASOUND AND FLUOROSCOPIC GUIDANCE MEDICATIONS: 2 g Ancef; The antibiotic was administered within an appropriate time interval prior to skin puncture. ANESTHESIA/SEDATION: Versed 2 mg IV; Fentanyl 100 mcg IV; Moderate Sedation Time:  15 minutes The patient was continuously monitored during the procedure by the interventional radiology nurse under my direct  supervision. FLUOROSCOPY TIME:  0 minutes, 6 seconds (1 mGy) COMPLICATIONS: None immediate. PROCEDURE: The right neck and chest was prepped with chlorhexidine, and draped in the usual sterile fashion using maximum barrier technique (cap and mask, sterile gown, sterile gloves, large sterile sheet, hand hygiene and cutaneous antiseptic). Local anesthesia was attained by infiltration with 1% lidocaine with epinephrine. Ultrasound demonstrated patency of the right internal jugular vein, and this was documented with an image. Under real-time ultrasound guidance, this vein was accessed with a 21 gauge micropuncture needle and image documentation was performed. A small dermatotomy was made at the access site with an 11 scalpel. A 0.018" wire was advanced into the SVC and the access needle exchanged for a 65F micropuncture vascular sheath. The 0.018" wire was then removed and a 0.035" wire advanced into the IVC. An appropriate location for the subcutaneous reservoir was selected below the clavicle and an incision was made through the skin and underlying soft tissues. The subcutaneous tissues were then dissected using a combination of blunt and sharp surgical technique and a pocket was formed. A single lumen power injectable portacatheter was then tunneled through the subcutaneous tissues from the pocket to the dermatotomy and the port reservoir placed within the subcutaneous pocket. The venous access site was then serially dilated and a peel away vascular sheath placed over the wire. The wire was removed and the port catheter advanced into position under fluoroscopic guidance. The catheter tip is positioned in the superior cavoatrial junction. This was documented with a spot image. The portacatheter was then tested and found to flush and aspirate well. The port was flushed with saline followed by 100 units/mL heparinized saline. The pocket was then closed in two layers using first subdermal inverted interrupted absorbable  sutures followed by a running subcuticular suture. The epidermis was then sealed with Dermabond. The dermatotomy at the venous access site was also closed with Dermabond. IMPRESSION: Successful placement of a right IJ approach Power Port with ultrasound and fluoroscopic guidance. The catheter is ready for use. Electronically Signed   By: Jacqulynn Cadet M.D.   On: 04/23/2019 18:17     ASSESSMENT & PLAN:   TALIBAH COLASURDO is a 85 y.o. female with:  1. 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 SKIN, LEFT THIGH, BIOPSY: - Involvement by angioimmunoblastic T-cell lymphoma." -04/07/2019 Echocardiogram - normal EF -NM PET Image Initial (PI) Skull Base To Thigh (Accession [id number]) completed on 04/08/2019 with results revealing "1. Widespread hypermetabolic adenopathy in the neck, chest, and abdomen/pelvis, primarily in the Deauville 4 and Deauville 5 range. There is also hypermetabolic activity along the posterior nasopharynx in lingual tonsillar regions potentially indicating sites of involvement. 2. The subtle hypodensities in the spine and bony pelvis are not appreciably hypermetabolic may be incidental, surveillance is suggested. 3. Bilateral thyroid activity but especially on the left. Probably from thyroiditis. 4. A 6 mm subpleural nodule anteriorly in the  right lower lobe not appreciably hypermetabolic but is below sensitive PET-CT size thresholds and merits surveillance. 5. Mildly accentuated diffuse splenic activity without splenomegaly or focal splenic lesion identified. 6. Other imaging findings of potential clinical significance: Aortic Atherosclerosis (ICD10-I70.0). Coronary atherosclerosis. Large right and small left pleural effusions. Moderate cardiomegaly. Small pericardial effusion. Mild mesenteric edema. Large left renal cyst. Fullness of both  renal collecting systems with suspected nonobstructive left nephrolithiasis. Degenerative grade 1 anterolisthesis at L4-5 at L5-S1. Trace pelvic ascites."   2. Pulmonary Embolism -extensive .likely related to extensive malignancy  3. H/o Breast Cancer  -The patient had bilateral diagnostic  mammography at [facility] 07/11/2011. This  showed some calcifications in the right  breast, which seemed a little bit more  prominent than prior. In the left breast  there was an area of possible  architectural distortion. However left  breast ultrasound the same day showed  no abnormality. 6 month followup was  suggested, and on 02/28/2012 the  patient again had bilateral diagnostic  mammography, now with right  ultrasonography. The microcalcifications  in the right breast appeared increased.  Ultrasound showed a hypoechoic lesion  measuring 5 mm, with no associated  shadowing. This had been previously  noted and appeared unchanged. Anterior  to that there was a small hypoechoic  mass measuring 4 mm in diameter.  There was felt to be suspicious, and on  03/14/2012 the patient underwent biopsy  of the right breast mass, showing ([id number]  [id number]) ductal carcinoma in situ,  intermediate grade, estrogen receptor  100% and progesterone receptor 100%  positive.   -Bilateral breast MRI was obtained  03/26/2012. This showed only post  biopsy changes in the right breast,  associated with an area of non-masslike  enhancement measuring 2.4 cm. The  left breast was unremarkable, and there  was no enlarged axillary or internal  mammary adenopathy noted.  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 The patient's  subsequent history is as detailed below.   PLAN: -Discussed pt labwork today, 05/14/19; all values are WNL except for RDW at 15.9, Lymphs Abs at 0.5, Abs immature  granulocytes at 0.12, BUN at 30, AST 12. -PET/Scan before next cycle of treatment  -Recommended that the pt continue to eat well, drink at least 48-64 oz of water each day, and walk 20-30 minutes each day. -Advised pt that there is about 50% chance of complete remission after a course of full-dose targeted therapy and chemotherapy however rx in her case will likely be limited by dosing limitations. -Will continue to monitor LLE ulcer, it is starting to heal -Will continue same dosage of treatment. -Will return to clinic in 3 weeks. Repeat scans in 1-2 weeks   FOLLOW-UP: -PET/CT in 2 weeks -please schedule C3 of treatment as per orders in 3 weeks (with neulasta D3), labs, portflush and MD visit  The total time spent in the appt was 25 minutes and more than 50% was on counseling and direct patient cares.  All of the patient's questions were answered with apparent satisfaction. The patient knows to call the clinic with any problems, questions or concerns.     Sullivan Lone MD [place] AAHIVMS [facility] Hematology/Oncology Physician [facility]  (Office):       [phone] (Work cell):  [phone] (Fax):  [phone]  05/14/2019 5:58 AM  I, Scot Dock, am acting as a scribe for Dr. Sullivan Lone.   .I have reviewed the above documentation for accuracy and completeness, and I agree with the above. Brunetta Genera MD

## 2019-05-14 NOTE — Patient Instructions (Addendum)
Talent Discharge Instructions for Patients Receiving Chemotherapy  A prescription for gabapentin has been sent to your pharmacy for pins/needles sensation in your feet.  You may start this medication tonight.  Today you received the following chemotherapy agents Adriamycin, Cytoxan, Brentuximab  To help prevent nausea and vomiting after your treatment, we encourage you to take your nausea medication as prescribed.   If you develop nausea and vomiting that is not controlled by your nausea medication, call the clinic.   BELOW ARE SYMPTOMS THAT SHOULD BE REPORTED IMMEDIATELY:  *FEVER GREATER THAN 100.5 F  *CHILLS WITH OR WITHOUT FEVER  NAUSEA AND VOMITING THAT IS NOT CONTROLLED WITH YOUR NAUSEA MEDICATION  *UNUSUAL SHORTNESS OF BREATH  *UNUSUAL BRUISING OR BLEEDING  TENDERNESS IN MOUTH AND THROAT WITH OR WITHOUT PRESENCE OF ULCERS  *URINARY PROBLEMS  *BOWEL PROBLEMS  UNUSUAL RASH Items with * indicate a potential emergency and should be followed up as soon as possible.  Feel free to call the clinic should you have any questions or concerns. The clinic phone number is [phone].  Please show the [facility] at check-in to the Emergency Department and triage nurse.  Doxorubicin injection What is this medicine? DOXORUBICIN (dox oh ROO bi sin) is a chemotherapy drug. It is used to treat many kinds of cancer like leukemia, lymphoma, neuroblastoma, sarcoma, and Wilms' tumor. It is also used to treat bladder cancer, breast cancer, lung cancer, ovarian cancer, stomach cancer, and thyroid cancer. This medicine may be used for other purposes; ask your health care provider or pharmacist if you have questions. COMMON BRAND NAME(S): Adriamycin, Adriamycin PFS, Adriamycin RDF, Rubex What should I tell my health care provider before I take this medicine? They need to know if you have any of these conditions:  heart disease  history of low blood counts caused  by a medicine  liver disease  recent or ongoing radiation therapy  an unusual or allergic reaction to doxorubicin, other chemotherapy agents, other medicines, foods, dyes, or preservatives  pregnant or trying to get pregnant  breast-feeding How should I use this medicine? This drug is given as an infusion into a vein. It is administered in a hospital or clinic by a specially trained health care professional. If you have pain, swelling, burning or any unusual feeling around the site of your injection, tell your health care professional right away. Talk to your pediatrician regarding the use of this medicine in children. Special care may be needed. Overdosage: If you think you have taken too much of this medicine contact a poison control center or emergency room at once. NOTE: This medicine is only for you. Do not share this medicine with others. What if I miss a dose? It is important not to miss your dose. Call your doctor or health care professional if you are unable to keep an appointment. What may interact with this medicine? This medicine may interact with the following medications:  6-mercaptopurine  paclitaxel  phenytoin  St. John's Wort  trastuzumab  verapamil This list may not describe all possible interactions. Give your health care provider a list of all the medicines, herbs, non-prescription drugs, or dietary supplements you use. Also tell them if you smoke, drink alcohol, or use illegal drugs. Some items may interact with your medicine. What should I watch for while using this medicine? This drug may make you feel generally unwell. This is not uncommon, as chemotherapy can affect healthy cells as well as cancer cells. Report any  side effects. Continue your course of treatment even though you feel ill unless your doctor tells you to stop. There is a maximum amount of this medicine you should receive throughout your life. The amount depends on the medical condition being  treated and your overall health. Your doctor will watch how much of this medicine you receive in your lifetime. Tell your doctor if you have taken this medicine before. You may need blood work done while you are taking this medicine. Your urine may turn red for a few days after your dose. This is not blood. If your urine is dark or brown, call your doctor. In some cases, you may be given additional medicines to help with side effects. Follow all directions for their use. Call your doctor or health care professional for advice if you get a fever, chills or sore throat, or other symptoms of a cold or flu. Do not treat yourself. This drug decreases your body's ability to fight infections. Try to avoid being around people who are sick. This medicine may increase your risk to bruise or bleed. Call your doctor or health care professional if you notice any unusual bleeding. Talk to your doctor about your risk of cancer. You may be more at risk for certain types of cancers if you take this medicine. Do not become pregnant while taking this medicine or for 6 months after stopping it. Women should inform their doctor if they wish to become pregnant or think they might be pregnant. Men should not father a child while taking this medicine and for 6 months after stopping it. There is a potential for serious side effects to an unborn child. Talk to your health care professional or pharmacist for more information. Do not breast-feed an infant while taking this medicine. This medicine has caused ovarian failure in some women and reduced sperm counts in some men This medicine may interfere with the ability to have a child. Talk with your doctor or health care professional if you are concerned about your fertility. This medicine may cause a decrease in Co-Enzyme Q-10. You should make sure that you get enough Co-Enzyme Q-10 while you are taking this medicine. Discuss the foods you eat and the vitamins you take with your  health care professional. What side effects may I notice from receiving this medicine? Side effects that you should report to your doctor or health care professional as soon as possible:  allergic reactions like skin rash, itching or hives, swelling of the face, lips, or tongue  breathing problems  chest pain  fast or irregular heartbeat  low blood counts - this medicine may decrease the number of white blood cells, red blood cells and platelets. You may be at increased risk for infections and bleeding.  pain, redness, or irritation at site where injected  signs of infection - fever or chills, cough, sore throat, pain or difficulty passing urine  signs of decreased platelets or bleeding - bruising, pinpoint red spots on the skin, black, tarry stools, blood in the urine  swelling of the ankles, feet, hands  tiredness  weakness Side effects that usually do not require medical attention (report to your doctor or health care professional if they continue or are bothersome):  diarrhea  hair loss  mouth sores  nail discoloration or damage  nausea  red colored urine  vomiting This list may not describe all possible side effects. Call your doctor for medical advice about side effects. You may report side effects to FDA  at 1-800-FDA-1088. Where should I keep my medicine? This drug is given in a hospital or clinic and will not be stored at home. NOTE: This sheet is a summary. It may not cover all possible information. If you have questions about this medicine, talk to your doctor, pharmacist, or health care provider.  2020 Elsevier/Gold Standard (2016-12-20 11:01:26)  Cyclophosphamide injection What is this medicine? CYCLOPHOSPHAMIDE (sye kloe FOSS fa mide) is a chemotherapy drug. It slows the growth of cancer cells. This medicine is used to treat many types of cancer like lymphoma, myeloma, leukemia, breast cancer, and ovarian cancer, to name a few. This medicine may be used  for other purposes; ask your health care provider or pharmacist if you have questions. COMMON BRAND NAME(S): Cytoxan, Neosar What should I tell my health care provider before I take this medicine? They need to know if you have any of these conditions:  blood disorders  history of other chemotherapy  infection  kidney disease  liver disease  recent or ongoing radiation therapy  tumors in the bone marrow  an unusual or allergic reaction to cyclophosphamide, other chemotherapy, other medicines, foods, dyes, or preservatives  pregnant or trying to get pregnant  breast-feeding How should I use this medicine? This drug is usually given as an injection into a vein or muscle or by infusion into a vein. It is administered in a hospital or clinic by a specially trained health care professional. Talk to your pediatrician regarding the use of this medicine in children. Special care may be needed. Overdosage: If you think you have taken too much of this medicine contact a poison control center or emergency room at once. NOTE: This medicine is only for you. Do not share this medicine with others. What if I miss a dose? It is important not to miss your dose. Call your doctor or health care professional if you are unable to keep an appointment. What may interact with this medicine? This medicine may interact with the following medications:  amiodarone  amphotericin B  azathioprine  certain antiviral medicines for HIV or AIDS such as protease inhibitors (e.g., indinavir, ritonavir) and zidovudine  certain blood pressure medications such as benazepril, captopril, enalapril, fosinopril, lisinopril, moexipril, monopril, perindopril, quinapril, ramipril, trandolapril  certain cancer medications such as anthracyclines (e.g., daunorubicin, doxorubicin), busulfan, cytarabine, paclitaxel, pentostatin, tamoxifen, trastuzumab  certain diuretics such as chlorothiazide, chlorthalidone,  hydrochlorothiazide, indapamide, metolazone  certain medicines that treat or prevent blood clots like warfarin  certain muscle relaxants such as succinylcholine  cyclosporine  etanercept  indomethacin  medicines to increase blood counts like filgrastim, pegfilgrastim, sargramostim  medicines used as general anesthesia  metronidazole  natalizumab This list may not describe all possible interactions. Give your health care provider a list of all the medicines, herbs, non-prescription drugs, or dietary supplements you use. Also tell them if you smoke, drink alcohol, or use illegal drugs. Some items may interact with your medicine. What should I watch for while using this medicine? Visit your doctor for checks on your progress. This drug may make you feel generally unwell. This is not uncommon, as chemotherapy can affect healthy cells as well as cancer cells. Report any side effects. Continue your course of treatment even though you feel ill unless your doctor tells you to stop. Drink water or other fluids as directed. Urinate often, even at night. In some cases, you may be given additional medicines to help with side effects. Follow all directions for their use. Call your doctor or  health care professional for advice if you get a fever, chills or sore throat, or other symptoms of a cold or flu. Do not treat yourself. This drug decreases your body's ability to fight infections. Try to avoid being around people who are sick. This medicine may increase your risk to bruise or bleed. Call your doctor or health care professional if you notice any unusual bleeding. Be careful brushing and flossing your teeth or using a toothpick because you may get an infection or bleed more easily. If you have any dental work done, tell your dentist you are receiving this medicine. You may get drowsy or dizzy. Do not drive, use machinery, or do anything that needs mental alertness until you know how this medicine  affects you. Do not become pregnant while taking this medicine or for 1 year after stopping it. Women should inform their doctor if they wish to become pregnant or think they might be pregnant. Men should not father a child while taking this medicine and for 4 months after stopping it. There is a potential for serious side effects to an unborn child. Talk to your health care professional or pharmacist for more information. Do not breast-feed an infant while taking this medicine. This medicine may interfere with the ability to have a child. This medicine has caused ovarian failure in some women. This medicine has caused reduced sperm counts in some men. You should talk with your doctor or health care professional if you are concerned about your fertility. If you are going to have surgery, tell your doctor or health care professional that you have taken this medicine. What side effects may I notice from receiving this medicine? Side effects that you should report to your doctor or health care professional as soon as possible:  allergic reactions like skin rash, itching or hives, swelling of the face, lips, or tongue  low blood counts - this medicine may decrease the number of white blood cells, red blood cells and platelets. You may be at increased risk for infections and bleeding.  signs of infection - fever or chills, cough, sore throat, pain or difficulty passing urine  signs of decreased platelets or bleeding - bruising, pinpoint red spots on the skin, black, tarry stools, blood in the urine  signs of decreased red blood cells - unusually weak or tired, fainting spells, lightheadedness  breathing problems  dark urine  dizziness  palpitations  swelling of the ankles, feet, hands  trouble passing urine or change in the amount of urine  weight gain  yellowing of the eyes or skin Side effects that usually do not require medical attention (report to your doctor or health care  professional if they continue or are bothersome):  changes in nail or skin color  hair loss  missed menstrual periods  mouth sores  nausea, vomiting This list may not describe all possible side effects. Call your doctor for medical advice about side effects. You may report side effects to FDA at 1-800-FDA-1088. Where should I keep my medicine? This drug is given in a hospital or clinic and will not be stored at home. NOTE: This sheet is a summary. It may not cover all possible information. If you have questions about this medicine, talk to your doctor, pharmacist, or health care provider.  2020 Elsevier/Gold Standard (2012-03-22 16:22:58)  Brentuximab vedotin solution for injection What is this medicine? BRENTUXIMAB VEDOTIN (bren TUX see mab ve DOE tin) is a monoclonal antibody and a chemotherapy drug. It is used  for treating Hodgkin lymphoma and certain non-Hodgkin lymphomas, such as anaplastic large-cell lymphoma, mycosis fungoides, and peripheral T-cell lymphoma. This medicine may be used for other purposes; ask your health care provider or pharmacist if you have questions. COMMON BRAND NAME(S): ADCETRIS What should I tell my health care provider before I take this medicine? They need to know if you have any of these conditions:  immune system problems  infection (especially a virus infection such as chickenpox, cold sores, or herpes)  kidney disease  liver disease  low blood counts, like low white cell, platelet, or red cell counts  tingling of the fingers or toes, or other nerve disorder  an unusual or allergic reaction to brentuximab vedotin, other medicines, foods, dyes, or preservatives  pregnant or trying to get pregnant  breast-feeding How should I use this medicine? This medicine is for infusion into a vein. It is given by a health care professional in a hospital or clinic setting. Talk to your pediatrician regarding the use of this medicine in children.  Special care may be needed. Overdosage: If you think you have taken too much of this medicine contact a poison control center or emergency room at once. NOTE: This medicine is only for you. Do not share this medicine with others. What if I miss a dose? It is important not to miss your dose. Call your doctor or health care professional if you are unable to keep an appointment. What may interact with this medicine? This medicine may interact with the following medications:  ketoconazole  rifampin  St. John's wort; Hypericum perforatum This list may not describe all possible interactions. Give your health care provider a list of all the medicines, herbs, non-prescription drugs, or dietary supplements you use. Also tell them if you smoke, drink alcohol, or use illegal drugs. Some items may interact with your medicine. What should I watch for while using this medicine? Visit your doctor for checks on your progress. This drug may make you feel generally unwell. Report any side effects. Continue your course of treatment even though you feel ill unless your doctor tells you to stop. Call your doctor or health care professional for advice if you get a fever, chills or sore throat, or other symptoms of a cold or flu. Do not treat yourself. This drug decreases your body's ability to fight infections. Try to avoid being around people who are sick. This medicine may increase your risk to bruise or bleed. Call your doctor or health care professional if you notice any unusual bleeding. In some patients, this medicine may cause a serious brain infection that may cause death. If you have any problems seeing, thinking, speaking, walking, or standing, tell your doctor right away. If you cannot reach your doctor, urgently seek other source of medical care. Do not become pregnant while taking this medicine or for 6 months after stopping it. Women should inform their doctor if they wish to become pregnant or think they  might be pregnant. Men should not father a child while taking this medicine and for 6 months after stopping it. There is a potential for serious side effects to an unborn child. Talk to your health care professional or pharmacist for more information. Do not breast-feed an infant while taking this medicine. This may interfere with the ability to father a child. You should talk to your doctor or health care professional if you are concerned about your fertility. What side effects may I notice from receiving this medicine? Side effects  that you should report to your doctor or health care professional as soon as possible:  allergic reactions like skin rash, itching or hives, swelling of the face, lips, or tongue  changes in emotions or moods  diarrhea  low blood counts - this medicine may decrease the number of white blood cells, red blood cells and platelets. You may be at increased risk for infections and bleeding.  pain, tingling, numbness in the hands or feet  redness, blistering, peeling or loosening of the skin, including inside the mouth  shortness of breath  signs of infection - fever or chills, cough, sore throat, pain or difficulty passing urine  signs of decreased platelets or bleeding - bruising, pinpoint red spots on the skin, black, tarry stools, blood in the urine  signs of decreased red blood cells - unusually weak or tired, fainting spells, lightheadedness  signs of liver injury like dark yellow or brown urine; general ill feeling or flu-like symptoms; light-colored stools; loss of appetite; nausea; right upper belly pain; yellowing of the eyes or skin  stomach pain  sudden numbness or weakness of the face, arm or leg  vomiting Side effects that usually do not require medical attention (report to your doctor or health care professional if they continue or are bothersome):  constipation  dizziness  headache  muscle pain  tiredness This list may not describe all  possible side effects. Call your doctor for medical advice about side effects. You may report side effects to FDA at 1-800-FDA-1088. Where should I keep my medicine? This drug is given in a hospital or clinic and will not be stored at home. NOTE: This sheet is a summary. It may not cover all possible information. If you have questions about this medicine, talk to your doctor, pharmacist, or health care provider.  2020 Elsevier/Gold Standard (2017-04-09 14:10:02)  Coronavirus (COVID-19) Are you at risk?  Are you at risk for the Coronavirus (COVID-19)?  To be considered HIGH RISK for Coronavirus (COVID-19), you have to meet the following criteria:  . Traveled to [place], [place], [place], [place] or [place]; or in the [place] to [place], [place], [place], or [place]; and have fever, cough, and shortness of breath within the last 2 weeks of travel OR . Been in close contact with a person diagnosed with COVID-19 within the last 2 weeks and have fever, cough, and shortness of breath . IF YOU DO NOT MEET THESE CRITERIA, YOU ARE CONSIDERED LOW RISK FOR COVID-19.  What to do if you are HIGH RISK for COVID-19?  Marland Kitchen If you are having a medical emergency, call 911. . Seek medical care right away. Before you go to a doctor's office, urgent care or emergency department, call ahead and tell them about your recent travel, contact with someone diagnosed with COVID-19, and your symptoms. You should receive instructions from your physician's office regarding next steps of care.  . When you arrive at healthcare provider, tell the healthcare staff immediately you have returned from visiting [place], [place], [place], [place] or [place]; or traveled in the [place] to [place], [place], [place], or [place]; in the last two weeks or you have been in close contact with a person diagnosed with COVID-19 in the last 2 weeks.   . Tell the health care staff about your symptoms: fever, cough and  shortness of breath. . After you have been seen by a medical provider, you will be either: o Tested for (COVID-19) and discharged  home on quarantine except to seek medical care if symptoms worsen, and asked to  - Stay home and avoid contact with others until you get your results (4-5 days)  - Avoid travel on public transportation if possible (such as bus, train, or airplane) or o Sent to the Emergency Department by EMS for evaluation, COVID-19 testing, and possible admission depending on your condition and test results.  What to do if you are LOW RISK for COVID-19?  Reduce your risk of any infection by using the same precautions used for avoiding the common cold or flu:  Marland Kitchen Wash your hands often with soap and warm water for at least 20 seconds.  If soap and water are not readily available, use an alcohol-based hand sanitizer with at least 60% alcohol.  . If coughing or sneezing, cover your mouth and nose by coughing or sneezing into the elbow areas of your shirt or coat, into a tissue or into your sleeve (not your hands). . Avoid shaking hands with others and consider head nods or verbal greetings only. . Avoid touching your eyes, nose, or mouth with unwashed hands.  . Avoid close contact with people who are sick. . Avoid places or events with large numbers of people in one location, like concerts or sporting events. . Carefully consider travel plans you have or are making. . If you are planning any travel outside or inside the [place], visit the CDC's Travelers' Health webpage for the latest health notices. . If you have some symptoms but not all symptoms, continue to monitor at home and seek medical attention if your symptoms worsen. . If you are having a medical emergency, call 911.   [place] / e-Visit: [email or web address]         [facility]: [facility]:  [phone]                   [facility]: [phone]

## 2019-05-15 ENCOUNTER — Inpatient Hospital Stay: Payer: Medicare Other

## 2019-05-15 ENCOUNTER — Ambulatory Visit: Payer: Medicare Other | Admitting: Critical Care Medicine

## 2019-05-15 ENCOUNTER — Other Ambulatory Visit: Payer: Self-pay

## 2019-05-15 ENCOUNTER — Telehealth: Payer: Self-pay | Admitting: Hematology

## 2019-05-15 VITALS — BP 129/85 | HR 87 | Temp 98.5°F | Resp 18

## 2019-05-15 DIAGNOSIS — Z5112 Encounter for antineoplastic immunotherapy: Secondary | ICD-10-CM | POA: Diagnosis not present

## 2019-05-15 DIAGNOSIS — Z7189 Other specified counseling: Secondary | ICD-10-CM

## 2019-05-15 DIAGNOSIS — C8440 Peripheral T-cell lymphoma, not classified, unspecified site: Secondary | ICD-10-CM

## 2019-05-15 MED ORDER — PEGFILGRASTIM-JMDB 6 MG/0.6ML ~~LOC~~ SOSY
PREFILLED_SYRINGE | SUBCUTANEOUS | Status: AC
  Filled 2019-05-15: qty 0.6

## 2019-05-15 MED ORDER — PEGFILGRASTIM-JMDB 6 MG/0.6ML ~~LOC~~ SOSY
6.0000 mg | PREFILLED_SYRINGE | Freq: Once | SUBCUTANEOUS | Status: AC
  Administered 2019-05-15: 16:00:00 6 mg via SUBCUTANEOUS

## 2019-05-15 NOTE — Telephone Encounter (Signed)
Scheduled appt per 12/23 los.  Spoke with pt daughter and she is aware of the appt date and time,

## 2019-05-15 NOTE — Patient Instructions (Signed)

## 2019-05-19 ENCOUNTER — Encounter (HOSPITAL_BASED_OUTPATIENT_CLINIC_OR_DEPARTMENT_OTHER): Payer: Medicare Other | Attending: Internal Medicine | Admitting: Internal Medicine

## 2019-05-19 ENCOUNTER — Other Ambulatory Visit: Payer: Self-pay

## 2019-05-19 DIAGNOSIS — Z86711 Personal history of pulmonary embolism: Secondary | ICD-10-CM | POA: Insufficient documentation

## 2019-05-19 DIAGNOSIS — L97828 Non-pressure chronic ulcer of other part of left lower leg with other specified severity: Secondary | ICD-10-CM | POA: Diagnosis not present

## 2019-05-19 DIAGNOSIS — Z96651 Presence of right artificial knee joint: Secondary | ICD-10-CM | POA: Insufficient documentation

## 2019-05-19 DIAGNOSIS — C865 Angioimmunoblastic T-cell lymphoma: Secondary | ICD-10-CM | POA: Insufficient documentation

## 2019-05-19 DIAGNOSIS — I872 Venous insufficiency (chronic) (peripheral): Secondary | ICD-10-CM | POA: Insufficient documentation

## 2019-05-19 DIAGNOSIS — Z853 Personal history of malignant neoplasm of breast: Secondary | ICD-10-CM | POA: Insufficient documentation

## 2019-05-19 DIAGNOSIS — I87312 Chronic venous hypertension (idiopathic) with ulcer of left lower extremity: Secondary | ICD-10-CM | POA: Insufficient documentation

## 2019-05-19 NOTE — Progress Notes (Signed)
Sarah [name], Sarah [name] ([id number]) Visit Report for 05/19/2019 Chief Complaint Document Details Patient Name: Date of Service: Sarah [name], Sarah [name] 05/19/2019 2:45 PM Medical Record [id number] Patient Account Number: [id number] Date of Birth/Sex: Treating RN: December 29, 1932 (85 y.o. Female) Primary Care Provider: Lajean Manes T Other Clinician: Referring Provider: Treating Provider/Extender:Maddilyn Campus, [name], Hal T Weeks in Treatment: 0 Information Obtained from: Patient Chief Complaint 05/19/2019; patient is here for review of wound on her left anterior mid tibial area Electronic Signature(s) Signed: 05/19/2019 6:10:19 PM By: Linton Ham MD Entered By: Linton Ham on 05/19/2019 17:23:29 -------------------------------------------------------------------------------- HPI Details Patient Name: Date of Service: Sarah [name] 05/19/2019 2:45 PM Medical Record [id number] Patient Account Number: [id number] Date of Birth/Sex: Treating RN: [birth year] (85 y.o. Female) Primary Care Provider: Lajean Manes T Other Clinician: Referring Provider: Treating Provider/Extender:Careena Degraffenreid, [name], Hal T Weeks in Treatment: 0 History of Present Illness HPI Description: ADMISSION 05/19/2019 This is a an 85 year old woman who is referred from her oncologist Dr.Kale for review of a wound on her left anterior mid tibial area. although there are certain parts of the notes that accompany her and also in [place] link that suggests she had a wound on the right anterior mid tibia, the patient is fairly insistent that the current wound is the only wound she is ever had. She relates that this began as a small area on her left anterior mid tibia sometime in August i.e. clearly before the diagnosis or treatment of her lymphoma began. She said that has been gradually increasing in size over time she is completely unaware of any other precipitating factors such as trauma  etc. She does have what looks to be changes of chronic venous insufficiency in her legs with some degree of hemosiderin deposition and edema but has never had problems with this before. The wound itself is exceptionally painful there is no drainage she has been using Bactroban on this twice a day with the help of her daughter. She was given a course of doxycycline last week by Dr. Donzetta Matters which she is tolerating well She also in September developed a widespread rash that was pruritic. She was seen by her primary doctor and dermatologist I believe. Ultimately she had a CT scan in early October that documented widespread adenopathy with splenomegaly.Marland Kitchen She was hospitalized from 03/06/2019 through 03/08/2019 presenting with shortness of breath leg swelling on the left. She was diagnosed with a pulmonary embolism with a DVT in the left leg this predominantly involve the left posterior tibial veins and the left gastrocnemius veins. She is on Eliquis since then. The patient was ultimately diagnosed with angioimmunoblastic T-cell lymphoma and she has been on chemotherapy including Adriamycin Cytoxan and brentuximab. She was receiving a course of this every 3 weeks. She stated that the first round of chemotherapy caused her rash to go away. 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 So far she is tolerating her chemotherapy well although this will stretch out into the beginning of March 2021 The patient's major complaint is the pain she has burning pain even at night that sometimes awakens her from sleep. Past medical history; lymphoma as noted above, breast CA with a lumpectomy in 2013, right total knee replacement, hypothyroidism, pulmonary embolism as described, DVT in the left leg also as described. ABI in our clinic was 1.26 on the left Electronic Signature(s) Signed: 05/19/2019 6:10:19 PM By: Linton Ham MD Entered By: Linton Ham on 05/19/2019  17:33:44 -------------------------------------------------------------------------------- Physical Exam Details Patient Name: Date  of Service: Sarah [name], Sarah [name] 05/19/2019 2:45 PM Medical Record [id number] Patient Account Number: [id number] Date of Birth/Sex: Treating RN: 10/22/1932 (85 y.o. Female) Primary Care Provider: Lajean Manes T Other Clinician: Referring Provider: Treating Provider/Extender:Finis Hendricksen, [name], Hal T Weeks in Treatment: 0 Constitutional Patient is hypertensive.. Pulse regular and within target range for patient.Marland Kitchen Respirations regular, non-labored and within target range.. Temperature is normal and within the target range for the patient.Marland Kitchen Appears in no distress. Eyes Conjunctivae clear. No discharge.no icterus. Respiratory work of breathing is normal. Bilateral breath sounds are clear and equal in all lobes with no wheezes, rales or rhonchi.. Cardiovascular Heart rhythm and rate regular, without murmur or gallop.. Femoral pulses also palpable on the left. Pedal pulses were easily palpable on the left. Changes of chronic venous insufficiency noted bilaterally with 2+ pitting edema bilaterally. Integumentary (Hair, Skin) Patient is skin changes of chronic venous insufficiency in bilateral lower legs. Psychiatric appears at normal baseline. Notes Wound exam; the patient has a quarter sized punched out area on the left anterior mid tibial area. Tightly covered by adherent debris. This was too painful even to get close to let alone considering a mechanical debridement today. There was no surrounding erythema although there was some tenderness around the wound Electronic Signature(s) Signed: 05/19/2019 6:10:19 PM By: Linton Ham MD Entered By: Linton Ham on 05/19/2019 17:34:24 -------------------------------------------------------------------------------- Physician Orders Details Patient Name: Date of Service: Sarah [name]  05/19/2019 2:45 PM Medical Record [id number] Patient Account Number: [id number] Date of Birth/Sex: Treating RN: 1932/10/11 (85 y.o. Female) Levan Hurst Primary Care Provider: Patrina Levering Other Clinician: Referring Provider: Treating Provider/Extender:Wataru Mccowen, [name], Hal T Weeks in Treatment: 0 Verbal / Phone Orders: No Diagnosis Coding Follow-up Appointments Return Appointment in 2 weeks. Dressing Change Frequency Wound #1 Left,Anterior Lower Leg Change dressing every day. Wound Cleansing Wound #1 Left,Anterior Lower Leg May shower and wash wound with soap and water. Primary Wound Dressing Wound #1 Left,Anterior Lower Leg Santyl Ointment Secondary Dressing Wound #1 Left,Anterior Lower Leg Foam Border - or large bandaid Edema Control Avoid standing for long periods of time Elevate legs to the level of the heart or above for 30 minutes daily and/or when sitting, a frequency of: - throughout the day Patient Medications Allergies: codeine, latex, Thiazides Notifications Medication Indication Start End Santyl 05/19/2019 DOSE topical 250 unit/gram ointment - ointment topical to wound left l/e change dailyj Electronic Signature(s) Signed: 05/19/2019 5:40:09 PM By: Linton Ham MD Entered By: Linton Ham on 05/19/2019 17:40:08 -------------------------------------------------------------------------------- Problem List Details Patient Name: Date of Service: Sarah [name] 05/19/2019 2:45 PM Medical Record [id number] Patient Account Number: [id number] Date of Birth/Sex: Treating RN: 28-Jul-1932 (85 y.o. Female) Primary Care Provider: Lajean Manes T Other Clinician: Referring Provider: Treating Provider/Extender:Ismelda Weatherman, [name], Hal T Weeks in Treatment: 0 Active Problems ICD-10 Evaluated Encounter Code Description Active Date Today Diagnosis L97.828 Non-pressure chronic ulcer of other part of left lower 05/19/2019 No  Yes leg with other specified severity I87.312 Chronic venous hypertension (idiopathic) with ulcer of 05/19/2019 No Yes left lower extremity C86.5 Angioimmunoblastic T-cell lymphoma 05/19/2019 No Yes Inactive Problems Resolved Problems Electronic Signature(s) Signed: 05/19/2019 6:10:19 PM By: Linton Ham MD Entered By: Linton Ham on 05/19/2019 17:22:26 -------------------------------------------------------------------------------- Progress Note Details Patient Name: Date of Service: Sarah [name] 05/19/2019 2:45 PM Medical Record [id number] Patient Account Number: [id number] Date of Birth/Sex: Treating RN: 05-08-1933 (85 y.o. Female) Primary Care Provider: Lajean Manes T Other Clinician: Referring Provider: Treating Provider/Extender:Corri Delapaz, [name], Hal T Weeks  in Treatment: 0 Subjective Chief Complaint Information obtained from Patient 05/19/2019; patient is here for review of wound on her left anterior mid tibial area History of Present Illness (HPI) ADMISSION 05/19/2019 This is a an 85 year old woman who is referred from her oncologist Dr.Kale for review of a wound on her left anterior mid tibial area. although there are certain parts of the notes that accompany her and also in [place] link that suggests she had a wound on the right anterior mid tibia, the patient is fairly insistent that the current wound is the only wound she is ever had. She relates that this began as a small area on her left anterior mid tibia sometime in August i.e. clearly before the diagnosis or treatment of her lymphoma began. She said that has been gradually increasing in size over time she is completely unaware of any other precipitating factors such as trauma etc. She does have what looks to be changes of chronic venous insufficiency in her legs with some degree of hemosiderin deposition and edema but has never had problems with this before. The wound itself is  exceptionally painful there is no drainage she has been using Bactroban on this twice a day with the help of her daughter. She was given a course of doxycycline last week by Dr. Donzetta Matters which she is tolerating well She also in September developed a widespread rash that was pruritic. She was seen by her primary doctor and dermatologist I believe. Ultimately she had a CT scan in early October that documented widespread adenopathy with splenomegaly.Marland Kitchen She was hospitalized from 03/06/2019 through 03/08/2019 presenting with shortness of breath leg swelling on the left. She was diagnosed with a pulmonary embolism with a DVT in the left leg this predominantly involve the left posterior tibial veins and the left gastrocnemius veins. She is on Eliquis since then. The patient was ultimately diagnosed with angioimmunoblastic T-cell lymphoma and she has been on chemotherapy including Adriamycin Cytoxan and brentuximab. She was receiving a course of this every 3 weeks. She stated that the first round of chemotherapy caused her rash to go away. 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 So far she is tolerating her chemotherapy well although this will stretch out into the beginning of March 2021 The patient's major complaint is the pain she has burning pain even at night that sometimes awakens her from sleep. Past medical history; lymphoma as noted above, breast CA with a lumpectomy in 2013, right total knee replacement, hypothyroidism, pulmonary embolism as described, DVT in the left leg also as described. ABI in our clinic was 1.26 on the left Patient History Information obtained from Patient. Allergies codeine, latex, Thiazides Family History Heart Disease - Father, No family history of Cancer, Diabetes, Hereditary Spherocytosis, Hypertension, Kidney Disease, Lung Disease, Seizures, Stroke, Thyroid Problems, Tuberculosis. Social History Never smoker, Marital Status -  Widowed, Alcohol Use - Never, Drug Use - No History, Caffeine Use - Moderate. Medical History Eyes Denies history of Cataracts, Glaucoma, Optic Neuritis Ear/Nose/Mouth/Throat Denies history of Chronic sinus problems/congestion, Middle ear problems Hematologic/Lymphatic Denies history of Anemia, Hemophilia, Human Immunodeficiency Virus, Lymphedema, Sickle Cell Disease Respiratory Denies history of Aspiration, Asthma, Chronic Obstructive Pulmonary Disease (COPD), Pneumothorax, Sleep Apnea, Tuberculosis Cardiovascular Patient has history of Hypertension Denies history of Angina, Arrhythmia, Congestive Heart Failure, Coronary Artery Disease, Deep Vein Thrombosis, Hypotension, Myocardial Infarction, Peripheral Arterial Disease, Peripheral Venous Disease, Phlebitis, Vasculitis Gastrointestinal Denies history of Cirrhosis , Colitis, Crohnoos, Hepatitis A, Hepatitis B, Hepatitis  C Endocrine Denies history of Type I Diabetes, Type II Diabetes Genitourinary Denies history of End Stage Renal Disease Immunological Denies history of Lupus Erythematosus, Raynaudoos, Scleroderma Integumentary (Skin) Denies history of History of Burn Musculoskeletal Denies history of Gout, Rheumatoid Arthritis, Osteoarthritis, Osteomyelitis Neurologic Denies history of Dementia, Neuropathy, Quadriplegia, Paraplegia, Seizure Disorder Oncologic Patient has history of Received Chemotherapy - dec 2020, Received Radiation - 2013 Psychiatric Denies history of Anorexia/bulimia, Confinement Anxiety Review of Systems (ROS) Constitutional Symptoms (General Health) Denies complaints or symptoms of Fatigue, Fever, Chills, Marked Weight Change. Eyes Denies complaints or symptoms of Dry Eyes, Vision Changes, Glasses / Contacts. Ear/Nose/Mouth/Throat Denies complaints or symptoms of Chronic sinus problems or rhinitis. Respiratory Denies complaints or symptoms of Chronic or frequent coughs, Shortness of  Breath. Cardiovascular Denies complaints or symptoms of Chest pain. Gastrointestinal Denies complaints or symptoms of Frequent diarrhea, Nausea, Vomiting. Endocrine Denies complaints or symptoms of Heat/cold intolerance. Genitourinary Denies complaints or symptoms of Frequent urination. Integumentary (Skin) Denies complaints or symptoms of Wounds. Musculoskeletal Denies complaints or symptoms of Muscle Pain, Muscle Weakness. Neurologic Denies complaints or symptoms of Numbness/parasthesias. Psychiatric Denies complaints or symptoms of Claustrophobia, Suicidal. Objective Constitutional Patient is hypertensive.. Pulse regular and within target range for patient.Marland Kitchen Respirations regular, non-labored and within target range.. Temperature is normal and within the target range for the patient.Marland Kitchen Appears in no distress. Vitals Time Taken: 3:19 PM, Height: 64 in, Source: Stated, Weight: 111 lbs, Source: Stated, BMI: 19.1, Temperature: 98.3 F, Pulse: 74 bpm, Respiratory Rate: 18 breaths/min, Blood Pressure: 143/71 mmHg. Eyes Conjunctivae clear. No discharge.no icterus. Respiratory work of breathing is normal. Bilateral breath sounds are clear and equal in all lobes with no wheezes, rales or rhonchi.. Cardiovascular Heart rhythm and rate regular, without murmur or gallop.. Femoral pulses also palpable on the left. Pedal pulses were easily palpable on the left. Changes of chronic venous insufficiency noted bilaterally with 2+ pitting edema bilaterally. Psychiatric appears at normal baseline. General Notes: Wound exam; the patient has a quarter sized punched out area on the left anterior mid tibial area. Tightly covered by adherent debris. This was too painful even to get close to let alone considering a mechanical debridement today. There was no surrounding erythema although there was some tenderness around the wound Integumentary (Hair, Skin) Patient is skin changes of chronic venous  insufficiency in bilateral lower legs. Wound #1 status is Open. Original cause of wound was Gradually Appeared. The wound is located on the Left,Anterior Lower Leg. The wound measures 2cm length x 1.8cm width x 0.3cm depth; 2.827cm^2 area and 0.848cm^3 volume. There is Fat Layer (Subcutaneous Tissue) Exposed exposed. There is no tunneling or undermining noted. There is a medium amount of serosanguineous drainage noted. There is small (1-33%) pink granulation within the wound bed. There is a large (67-100%) amount of necrotic tissue within the wound bed including Adherent Slough. Assessment Active Problems ICD-10 Non-pressure chronic ulcer of other part of left lower leg with other specified severity Chronic venous hypertension (idiopathic) with ulcer of left lower extremity Angioimmunoblastic T-cell lymphoma Plan Follow-up Appointments: Return Appointment in 2 weeks. Dressing Change Frequency: Wound #1 Left,Anterior Lower Leg: Change dressing every day. Wound Cleansing: Wound #1 Left,Anterior Lower Leg: May shower and wash wound with soap and water. Primary Wound Dressing: Wound #1 Left,Anterior Lower Leg: Santyl Ointment Secondary Dressing: Wound #1 Left,Anterior Lower Leg: Foam Border - or large bandaid Edema Control: Avoid standing for long periods of time Elevate legs to the level of the heart or  above for 30 minutes daily and/or when sitting, a frequency of: - throughout the day The following medication(s) was prescribed: Santyl topical 250 unit/gram ointment ointment topical to wound left l/e change dailyj starting 05/19/2019 1. The exact etiology of the wound in the left leg is not clear. Per the patient's description this clearly started before she was given chemotherapy. She does have chronic venous insufficiency but is never experienced wound issues. Common things being common I think it is possible this is a venous insufficiency ulcer. She also was diagnosed with DVT  apparently involving the small distal veins of her calf which contributed to swelling and possibly some degree of skin breakdown. 2. From a purely wound care point of view the surface over the wound is necrotic and will need to be debrided. She would not allow any attempt at mechanical debridement today. We will prescribe Santyl to the wound change daily which the patient and her daughter will dressed. Hopefully this will give a less adherent wound surface that may be easier to remove. Until that happens this is not going to progress towards healing. 3. She was given doxycycline last week by Dr. Donzetta Matters. No clear evidence of infection today. There was nothing to culture here 4. She has edema in the leg however I did not put her under compression. 5. Could consider venous reflux studies at some point however I did not think this was the time to order this. I do not believe she has an arterial issue contributing to the wound in the left leg Electronic Signature(s) Signed: 05/19/2019 5:40:43 PM By: Linton Ham MD Entered By: Linton Ham on 05/19/2019 17:40:43 -------------------------------------------------------------------------------- HxROS Details Patient Name: Date of Service: Sarah [name] 05/19/2019 2:45 PM Medical Record [id number] Patient Account Number: [id number] Date of Birth/Sex: Treating RN: 1932-11-05 (85 y.o. Female) Carlene Coria Primary Care Provider: Lajean Manes T Other Clinician: Referring Provider: Treating Provider/Extender:Jennetta Flood, [name], Hal T Weeks in Treatment: 0 Information Obtained From Patient Constitutional Symptoms (General Health) Complaints and Symptoms: Negative for: Fatigue; Fever; Chills; Marked Weight Change Eyes Complaints and Symptoms: Negative for: Dry Eyes; Vision Changes; Glasses / Contacts Medical History: Negative for: Cataracts; Glaucoma; Optic Neuritis Ear/Nose/Mouth/Throat Complaints and Symptoms: Negative  for: Chronic sinus problems or rhinitis Medical History: Negative for: Chronic sinus problems/congestion; Middle ear problems Respiratory Complaints and Symptoms: Negative for: Chronic or frequent coughs; Shortness of Breath Medical History: Negative for: Aspiration; Asthma; Chronic Obstructive Pulmonary Disease (COPD); Pneumothorax; Sleep Apnea; Tuberculosis Cardiovascular Complaints and Symptoms: Negative for: Chest pain Medical History: Positive for: Hypertension Negative for: Angina; Arrhythmia; Congestive Heart Failure; Coronary Artery Disease; Deep Vein Thrombosis; Hypotension; Myocardial Infarction; Peripheral Arterial Disease; Peripheral Venous Disease; Phlebitis; Vasculitis Gastrointestinal Complaints and Symptoms: Negative for: Frequent diarrhea; Nausea; Vomiting Medical History: Negative for: Cirrhosis ; Colitis; Crohns; Hepatitis A; Hepatitis B; Hepatitis C Endocrine Complaints and Symptoms: Negative for: Heat/cold intolerance Medical History: Negative for: Type I Diabetes; Type II Diabetes Genitourinary Complaints and Symptoms: Negative for: Frequent urination Medical History: Negative for: End Stage Renal Disease Integumentary (Skin) Complaints and Symptoms: Negative for: Wounds Medical History: Negative for: History of Burn Musculoskeletal Complaints and Symptoms: Negative for: Muscle Pain; Muscle Weakness Medical History: Negative for: Gout; Rheumatoid Arthritis; Osteoarthritis; Osteomyelitis Neurologic Complaints and Symptoms: Negative for: Numbness/parasthesias Medical History: Negative for: Dementia; Neuropathy; Quadriplegia; Paraplegia; Seizure Disorder Psychiatric Complaints and Symptoms: Negative for: Claustrophobia; Suicidal Medical History: Negative for: Anorexia/bulimia; Confinement Anxiety Hematologic/Lymphatic Medical History: Negative for: Anemia; Hemophilia; Human Immunodeficiency Virus; Lymphedema; Sickle Cell  Disease  Immunological Medical History: Negative for: Lupus Erythematosus; Raynauds; Scleroderma Oncologic Medical History: Positive for: Received Chemotherapy - dec 2020; Received Radiation - 2013 Immunizations Pneumococcal Vaccine: Received Pneumococcal Vaccination: No Implantable Devices None Family and Social History Cancer: No; Diabetes: No; Heart Disease: Yes - Father; Hereditary Spherocytosis: No; Hypertension: No; Kidney Disease: No; Lung Disease: No; Seizures: No; Stroke: No; Thyroid Problems: No; Tuberculosis: No; Never smoker; Marital Status - Widowed; Alcohol Use: Never; Drug Use: No History; Caffeine Use: Moderate; Financial Concerns: No; Food, Clothing or Shelter Needs: No; Support System Lacking: No; Transportation Concerns: No Electronic Signature(s) Signed: 05/19/2019 5:45:39 PM By: Carlene Coria RN Signed: 05/19/2019 6:10:19 PM By: Linton Ham MD Entered By: Carlene Coria on 05/19/2019 15:32:47 -------------------------------------------------------------------------------- SuperBill Details Patient Name: Date of Service: Sarah [name] 05/19/2019 Medical Record [id number] Patient Account Number: [id number] Date of Birth/Sex: Treating RN: 04-05-1933 (86 y.o. Female) Primary Care Provider: Lajean Manes T Other Clinician: Referring Provider: Treating Provider/Extender:Nasiah Polinsky, [name], Hal T Weeks in Treatment: 0 Diagnosis Coding ICD-10 Codes Code Description [phone] Non-pressure chronic ulcer of other part of left lower leg with other specified severity I87.312 Chronic venous hypertension (idiopathic) with ulcer of left lower extremity C86.5 Angioimmunoblastic T-cell lymphoma Facility Procedures CPT4 Code: [id number] Description: 99214 - WOUND CARE VISIT-LEV 4 EST PT Modifier: Quantity: 1 Physician Procedures CPT4 Code Description: [id number] WC PHYS LEVEL 3 NEW PT ICD-10 Diagnosis Description L97.828 Non-pressure chronic ulcer of other  part of left lower leg severity I87.312 Chronic venous hypertension (idiopathic) with ulcer of left C86.5 Angioimmunoblastic  T-cell lymphoma Modifier: with other specifie lower extremity Quantity: 1 d Electronic Signature(s) Signed: 05/19/2019 6:10:06 PM By: Levan Hurst RN, BSN Signed: 05/19/2019 6:10:19 PM By: Linton Ham MD Entered By: Levan Hurst on 05/19/2019 17:59:17

## 2019-05-19 NOTE — Progress Notes (Signed)
KANYON, BUNN ([id number]) Visit Report for 05/19/2019 Abuse/Suicide Risk Screen Details Patient Name: Date of Service: Sarah [name], Sarah [name] 05/19/2019 2:45 PM Medical Record [id number] Patient Account Number: [id number] Date of Birth/Sex: Treating RN: September 09, 1932 (85 y.o. Female) Carlene Coria Primary Care Rashea Hoskie: Lajean Manes T Other Clinician: Referring Alzada Brazee: Treating Lahna Nath/Extender:Robson, [name], Hal T Weeks in Treatment: 0 Abuse/Suicide Risk Screen Items Answer ABUSE RISK SCREEN: Has anyone close to you tried to hurt or harm you recentlyo No Do you feel uncomfortable with anyone in your familyo No Has anyone forced you do things that you didnt want to doo No Electronic Signature(s) Signed: 05/19/2019 5:45:39 PM By: Carlene Coria RN Entered By: Carlene Coria on 05/19/2019 15:33:36 -------------------------------------------------------------------------------- Activities of Daily Living Details Patient Name: Date of Service: Sarah [name], Sarah [name] 05/19/2019 2:45 PM Medical Record [id number] Patient Account Number: [id number] Date of Birth/Sex: Treating RN: Jul 28, 1932 (85 y.o. Female) Carlene Coria Primary Care Aneisha Skyles: Lajean Manes T Other Clinician: Referring Meghanne Pletz: Treating Kullen Tomasetti/Extender:Robson, [name], Hal T Weeks in Treatment: 0 Activities of Daily Living Items Answer Activities of Daily Living (Please select one for each item) Drive Automobile Completely Able Take Medications Completely Able Use Telephone Completely Able Care for Appearance Completely Able Use Toilet Completely Able Bath / Shower Completely Able Dress Self Completely Able Feed Self Completely Able Walk Completely Able Get In / Out Bed Completely Able Housework Completely Able Prepare Meals Completely Able Handle Money Completely Able Shop for Self Completely Able Electronic Signature(s) Signed: 05/19/2019 5:45:39 PM By: Carlene Coria  RN Entered By: Carlene Coria on 05/19/2019 15:36:03 -------------------------------------------------------------------------------- Education Screening Details Patient Name: Date of Service: Sarah [name] 05/19/2019 2:45 PM Medical Record [id number] Patient Account Number: [id number] Date of Birth/Sex: Treating RN: 08/25/1932 (85 y.o. Female) Carlene Coria Primary Care Islah Eve: Lajean Manes T Other Clinician: Referring Emran Molzahn: Treating Cinnamon Morency/Extender:Robson, [name], Junius Creamer in Treatment: 0 Primary Learner Assessed: Patient Learning Preferences/Education Level/Primary Language Learning Preference: Explanation Highest Education Level: College or Above Preferred Language: English Cognitive Barrier Language Barrier: No Translator Needed: No Memory Deficit: No Emotional Barrier: No Cultural/Religious Beliefs Affecting Medical Care: No Physical Barrier Impaired Vision: No Impaired Hearing: No Decreased Hand dexterity: No Knowledge/Comprehension Knowledge Level: High Comprehension Level: High Ability to understand written High instructions: Ability to understand verbal High instructions: Motivation Anxiety Level: Calm Cooperation: Cooperative Education Importance: Acknowledges Need Interest in Health Problems: Asks Questions Perception: Coherent Willingness to Engage in Self- High Management Activities: Readiness to Engage in Self- High Management Activities: Electronic Signature(s) Signed: 05/19/2019 5:45:39 PM By: Carlene Coria RN Entered By: Carlene Coria on 05/19/2019 15:36:41 -------------------------------------------------------------------------------- Fall Risk Assessment Details Patient Name: Date of Service: Sarah [name] 05/19/2019 2:45 PM Medical Record [id number] Patient Account Number: [id number] Date of Birth/Sex: Treating RN: 1932/12/23 (85 y.o. Female) Carlene Coria Primary Care Faysal Fenoglio: Lajean Manes  T Other Clinician: Referring Anapaula Severt: Treating Hervey Wedig/Extender:Robson, [name], Hal T Weeks in Treatment: 0 Fall Risk Assessment Items Have you had 2 or more falls in the last 12 monthso 0 No Have you had any fall that resulted in injury in the last 12 monthso 0 No FALLS RISK SCREEN History of falling - immediate or within 3 months 0 No Secondary diagnosis (Do you have 2 or more medical diagnoseso) 0 No Ambulatory aid None/bed rest/wheelchair/nurse 0 No Crutches/cane/walker 0 No Furniture 0 No Intravenous therapy Access/Saline/Heparin Lock 0 No Weak (short steps with or without shuffle, stooped but able to lift head 0 No while walking, may seek support from  furniture) Impaired (short steps with shuffle, may have difficulty arising from chair, 0 No head down, impaired balance) Mental Status Oriented to own ability 0 No Overestimates or forgets limitations 0 No Risk Level: Low Risk Score: 0 Electronic Signature(s) Signed: 05/19/2019 5:45:39 PM By: Carlene Coria RN Entered By: Carlene Coria on 05/19/2019 15:37:26 -------------------------------------------------------------------------------- Foot Assessment Details Patient Name: Date of Service: Sarah [name] 05/19/2019 2:45 PM Medical Record [id number] Patient Account Number: [id number] Date of Birth/Sex: Treating RN: [birth year] (85 y.o. Female) Epps, Milo Primary Care Smrithi Pigford: Lajean Manes T Other Clinician: Referring Madisun Hargrove: Treating Tahra Hitzeman/Extender:Robson, [name], Hal T Weeks in Treatment: 0 Foot Assessment Items Site Locations + = Sensation present, - = Sensation absent, C = Callus, U = Ulcer R = Redness, W = Warmth, M = Maceration, PU = Pre-ulcerative lesion F = Fissure, S = Swelling, D = Dryness Assessment Right: Left: Other Deformity: No No Prior Foot Ulcer: No No Prior Amputation: No No Charcot Joint: No No Ambulatory Status: Ambulatory Without Help Gait:  Steady Electronic Signature(s) Signed: 05/19/2019 5:45:39 PM By: Carlene Coria RN Entered By: Carlene Coria on 05/19/2019 15:40:07 -------------------------------------------------------------------------------- Nutrition Risk Screening Details Patient Name: Date of Service: Sarah [name], Sarah [name] 05/19/2019 2:45 PM Medical Record [id number] Patient Account Number: [id number] Date of Birth/Sex: Treating RN: January 14, 1933 (85 y.o. Female) Epps, Morey Hummingbird Primary Care Johnaton Sonneborn: Lajean Manes T Other Clinician: Referring Jedrek Dinovo: Treating Deniesha Stenglein/Extender:Robson, [name], Hal T Weeks in Treatment: 0 Height (in): 64 Weight (lbs): 111 Body Mass Index (BMI): 19.1 Nutrition Risk Screening Items Score Screening NUTRITION RISK SCREEN: I have an illness or condition that made me change the kind and/or 0 No amount of food I eat I eat fewer than two meals per day 0 No I eat few fruits and vegetables, or milk products 0 No I have three or more drinks of beer, liquor or wine almost every day 0 No I have tooth or mouth problems that make it hard for me to eat 0 No I don't always have enough money to buy the food I need 0 No I eat alone most of the time 0 No I take three or more different prescribed or over-the-counter drugs a day 1 Yes 2 Yes Without wanting to, I have lost or gained 10 pounds in the last six months I am not always physically able to shop, cook and/or feed myself 0 No Nutrition Protocols Good Risk Protocol Provide education on Moderate Risk Protocol 0 nutrition High Risk Proctocol Risk Level: Moderate Risk Score: 3 Electronic Signature(s) Signed: 05/19/2019 5:45:39 PM By: Carlene Coria RN Entered By: Carlene Coria on 05/19/2019 15:37:57

## 2019-05-19 NOTE — Progress Notes (Addendum)
MARTY, SADLOWSKI ([id number]) Visit Report for 05/19/2019 Allergy List Details Patient Name: Date of Service: Sarah [name], Sarah [name] 05/19/2019 2:45 PM Medical Record [id number] Patient Account Number: [id number] Date of Birth/Sex: Treating RN: 02-15-1933 (85 y.o. Female) Carlene Coria Primary Care Amairany Schumpert: Lajean Manes T Other Clinician: Referring Jermaine Neuharth: Treating Joandy Burget/Extender:Robson, [name], Hal T Weeks in Treatment: 0 Allergies Active Allergies codeine latex Thiazides Allergy Notes Electronic Signature(s) Signed: 05/19/2019 5:45:39 PM By: Carlene Coria RN Entered By: Carlene Coria on 05/19/2019 15:22:16 -------------------------------------------------------------------------------- Arrival Information Details Patient Name: Date of Service: Sarah [name] 05/19/2019 2:45 PM Medical Record [id number] Patient Account Number: [id number] Date of Birth/Sex: Treating RN: Feb 12, 1933 (85 y.o. Female) Carlene Coria Primary Care Kayana Thoen: Lajean Manes T Other Clinician: Referring Milla Wahlberg: Treating Tricia Pledger/Extender:Robson, [name], Junius Creamer in Treatment: 0 Visit Information Patient Arrived: Ambulatory Arrival Time: 15:11 Accompanied By: daughter Transfer Assistance: None Patient Identification Verified: Yes Secondary Verification Process Completed: Yes Electronic Signature(s) Signed: 05/19/2019 5:45:39 PM By: Carlene Coria RN Entered By: Carlene Coria on 05/19/2019 15:19:46 -------------------------------------------------------------------------------- Clinic Level of Care Assessment Details Patient Name: Date of Service: Sarah [name], Sarah [name] 05/19/2019 2:45 PM Medical Record [id number] Patient Account Number: [id number] Date of Birth/Sex: Treating RN: 04/28/1933 (85 y.o. Female) Levan Hurst Primary Care Brindley Madarang: Patrina Levering Other Clinician: Referring Keon Pender: Treating Torry Istre/Extender:Robson,  [name], Hal T Weeks in Treatment: 0 Clinic Level of Care Assessment Items TOOL 2 Quantity Score X - Use when only an EandM is performed on the INITIAL visit 1 0 ASSESSMENTS - Nursing Assessment / Reassessment X - General Physical Exam (combine w/ comprehensive assessment (listed just below) 1 20 when performed on new pt. evals) X - Comprehensive Assessment (HX, ROS, Risk Assessments, Wounds Hx, etc.) 1 25 ASSESSMENTS - Wound and Skin Assessment / Reassessment X - Simple Wound Assessment / Reassessment - one wound 1 5 []  - Complex Wound Assessment / Reassessment - multiple wounds 0 []  - Dermatologic / Skin Assessment (not related to wound area) 0 ASSESSMENTS - Ostomy and/or Continence Assessment and Care []  - Incontinence Assessment and Management 0 []  - Ostomy Care Assessment and Management (repouching, etc.) 0 PROCESS - Coordination of Care X - Simple Patient / Family Education for ongoing care 1 15 []  - Complex (extensive) Patient / Family Education for ongoing care 0 X - Staff obtains Programmer, systems, Records, Test Results / Process Orders 1 10 []  - Staff telephones HHA, Nursing Homes / Clarify orders / etc 0 []  - Routine Transfer to another Facility (non-emergent condition) 0 []  - Routine Hospital Admission (non-emergent condition) 0 X - New Admissions / Biomedical engineer / Ordering NPWT, Apligraf, etc. 1 15 []  - Emergency Hospital Admission (emergent condition) 0 X - Simple Discharge Coordination 1 10 []  - Complex (extensive) Discharge Coordination 0 PROCESS - Special Needs []  - Pediatric / Minor Patient Management 0 []  - Isolation Patient Management 0 []  - Hearing / Language / Visual special needs 0 []  - Assessment of Community assistance (transportation, D/C planning, etc.) 0 []  - Additional assistance / Altered mentation 0 []  - Support Surface(s) Assessment (bed, cushion, seat, etc.) 0 INTERVENTIONS - Wound Cleansing / Measurement X - Wound Imaging (photographs  - any number of wounds) 1 5 []  - Wound Tracing (instead of photographs) 0 X - Simple Wound Measurement - one wound 1 5 []  - Complex Wound Measurement - multiple wounds 0 X - Simple Wound Cleansing - one wound 1 5 []  - Complex Wound Cleansing - multiple wounds 0 INTERVENTIONS -  Wound Dressings X - Small Wound Dressing one or multiple wounds 1 10 []  - Medium Wound Dressing one or multiple wounds 0 []  - Large Wound Dressing one or multiple wounds 0 []  - Application of Medications - injection 0 INTERVENTIONS - Miscellaneous []  - External ear exam 0 []  - Specimen Collection (cultures, biopsies, blood, body fluids, etc.) 0 []  - Specimen(s) / Culture(s) sent or taken to Lab for analysis 0 []  - Patient Transfer (multiple staff / Harrel Lemon Lift / Similar devices) 0 []  - Simple Staple / Suture removal (25 or less) 0 []  - Complex Staple / Suture removal (26 or more) 0 []  - Hypo / Hyperglycemic Management (close monitor of Blood Glucose) 0 X - Ankle / Brachial Index (ABI) - do not check if billed separately 1 15 Has the patient been seen at the hospital within the last three years: Yes Total Score: 140 Level Of Care: New/Established - Level 4 Electronic Signature(s) Signed: 05/19/2019 6:10:06 PM By: Levan Hurst RN, BSN Entered By: Levan Hurst on 05/19/2019 17:59:09 -------------------------------------------------------------------------------- Encounter Discharge Information Details Patient Name: Date of Service: Sarah [name] 05/19/2019 2:45 PM Medical Record [id number] Patient Account Number: [id number] Date of Birth/Sex: Treating RN: [birth year] (85 y.o. Female) Kela Millin Primary Care Keshaun Dubey: Patrina Levering Other Clinician: Referring Roben Tatsch: Treating Dory Verdun/Extender:Robson, [name], Hal T Weeks in Treatment: 0 Encounter Discharge Information Items Discharge Condition: Stable Ambulatory Status: Ambulatory Discharge Destination:  Home Transportation: Private Auto Accompanied By: daughter Schedule Follow-up Appointment: Yes Clinical Summary of Care: Patient Declined Electronic Signature(s) Signed: 05/19/2019 5:46:04 PM By: Kela Millin Entered By: Kela Millin on 05/19/2019 17:10:32 -------------------------------------------------------------------------------- Lower Extremity Assessment Details Patient Name: Date of Service: Sarah [name], Sarah [name] 05/19/2019 2:45 PM Medical Record [id number] Patient Account Number: [id number] Date of Birth/Sex: Treating RN: Jun 06, 1932 (85 y.o. Female) Carlene Coria Primary Care Kaitlin Ardito: Lajean Manes T Other Clinician: Referring Jazlyne Gauger: Treating Julaine Zimny/Extender:Robson, [name], Hal T Weeks in Treatment: 0 Edema Assessment Assessed: [Left: No] [Right: No] E[Left: dema] [Right: :] Calf Left: Right: Point of Measurement: 36 cm From Medial Instep 31 cm cm Ankle Left: Right: Point of Measurement: 9 cm From Medial Instep 21.8 cm cm Vascular Assessment Blood Pressure: Brachial: [Left:143] Ankle: [Left:Dorsalis Pedis: 180 1.26] Electronic Signature(s) Signed: 05/19/2019 5:45:39 PM By: Carlene Coria RN Entered By: Carlene Coria on 05/19/2019 15:47:22 -------------------------------------------------------------------------------- Multi Wound Chart Details Patient Name: Date of Service: Sarah [name] 05/19/2019 2:45 PM Medical Record [id number] Patient Account Number: [id number] Date of Birth/Sex: Treating RN: May 09, 1933 (85 y.o. Female) Primary Care Braeley Buskey: Lajean Manes T Other Clinician: Referring Kyndall Amero: Treating Natassja Ollis/Extender:Robson, [name], Hal T Weeks in Treatment: 0 Vital Signs Height(in): 64 Pulse(bpm): 74 Weight(lbs): 111 Blood Pressure(mmHg): 143/71 Body Mass Index(BMI): 19 Temperature(F): 98.3 Respiratory 18 Rate(breaths/min): Photos: [1:No Photos] [N/A:N/A] Wound Location: [1:Left Lower Leg -  Anterior N/A] Wounding Event: [1:Gradually Appeared] [N/A:N/A] Primary Etiology: [1:Venous Leg Ulcer] [N/A:N/A] Comorbid History: [1:Hypertension, Received Chemotherapy, Received Radiation] [N/A:N/A] Date Acquired: [1:12/21/2018] [N/A:N/A] Weeks of Treatment: [1:0] [N/A:N/A] Wound Status: [1:Open] [N/A:N/A] Measurements L x W x D 2x1.8x0.3 [N/A:N/A] (cm) Area (cm) : [1:2.827] [N/A:N/A] Volume (cm) : [1:0.848] [N/A:N/A] Classification: [1:Full Thickness Without Exposed Support Structures] [N/A:N/A] Exudate Amount: [1:Medium] [N/A:N/A] Exudate Type: [1:Serosanguineous] [N/A:N/A] Exudate Color: [1:red, brown] [N/A:N/A] Granulation Amount: [1:Small (1-33%)] [N/A:N/A] Granulation Quality: [1:Pink] [N/A:N/A] Necrotic Amount: [1:Large (67-100%)] [N/A:N/A] Exposed Structures: [1:Fat Layer (Subcutaneous Tissue) Exposed: Yes Fascia: No Tendon: No Muscle: No Joint: No Bone: No None] [N/A:N/A N/A] Treatment Notes Wound #1 (Left, Anterior Lower Leg)  1. Cleanse With Wound Cleanser 2. Periwound Care Skin Prep 3. Primary Dressing Applied Santyl 4. Secondary Dressing Foam Border Dressing Electronic Signature(s) Signed: 05/19/2019 6:10:19 PM By: Linton Ham MD Entered By: Linton Ham on 05/19/2019 17:22:34 -------------------------------------------------------------------------------- [place] Details Patient Name: Date of Service: Sarah [name] 05/19/2019 2:45 PM Medical Record [id number] Patient Account Number: [id number] Date of Birth/Sex: Treating RN: 02/02/1933 (85 y.o. Female) Levan Hurst Primary Care Tihanna Goodson: Patrina Levering Other Clinician: Referring Marwan Lipe: Treating Azarion Hove/Extender:Robson, [name], Hal T Weeks in Treatment: 0 Active Inactive Venous Leg Ulcer Nursing Diagnoses: Knowledge deficit related to disease process and management Potential for venous Insuffiency (use before diagnosis confirmed) Goals: Patient  will maintain optimal edema control Date Initiated: 05/19/2019 Target Resolution Date: 06/20/2019 Goal Status: Active Patient/caregiver will verbalize understanding of disease process and disease management Date Initiated: 05/19/2019 Target Resolution Date: 06/20/2019 Goal Status: Active Interventions: Assess peripheral edema status every visit. Provide education on venous insufficiency Notes: Wound/Skin Impairment Nursing Diagnoses: Impaired tissue integrity Knowledge deficit related to ulceration/compromised skin integrity Goals: Patient/caregiver will verbalize understanding of skin care regimen Date Initiated: 05/19/2019 Target Resolution Date: 06/20/2019 Goal Status: Active Ulcer/skin breakdown will have a volume reduction of 30% by week 4 Date Initiated: 05/19/2019 Target Resolution Date: 06/20/2019 Goal Status: Active Interventions: Assess patient/caregiver ability to obtain necessary supplies Assess patient/caregiver ability to perform ulcer/skin care regimen upon admission and as needed Assess ulceration(s) every visit Provide education on ulcer and skin care Notes: Electronic Signature(s) Signed: 05/19/2019 6:10:06 PM By: Levan Hurst RN, BSN Entered By: Levan Hurst on 05/19/2019 17:57:51 -------------------------------------------------------------------------------- Pain Assessment Details Patient Name: Date of Service: Sarah [name] 05/19/2019 2:45 PM Medical Record [id number] Patient Account Number: [id number] Date of Birth/Sex: Treating RN: January 27, 1933 (85 y.o. Female) Carlene Coria Primary Care Clell Trahan: Lajean Manes T Other Clinician: Referring Stephan Draughn: Treating Jarl Sellitto/Extender:Robson, [name], Hal T Weeks in Treatment: 0 Active Problems Location of Pain Severity and Description of Pain Patient Has Paino Yes Site Locations With Dressing Change: Yes With Dressing Change: Yes Duration of the Pain. Constant / Intermittento  Intermittent How Long Does it Lasto Hours: Minutes: 15 Rate the pain. Current Pain Level: 5 Worst Pain Level: 7 Least Pain Level: 0 Tolerable Pain Level: 5 Character of Pain Describe the Pain: Aching, Burning Pain Management and Medication Current Pain Management: Medication: Yes Cold Application: No Rest: Yes Massage: No Activity: No T.E.N.S.: No Heat Application: No Leg drop or elevation: No Is the Current Pain Management Adequate: Inadequate How does your wound impact your activities of daily livingo Sleep: Yes Bathing: No Appetite: Yes Relationship With Others: No Bladder Continence: No Emotions: No Bowel Continence: No Work: No Toileting: No Drive: No Dressing: No Hobbies: No Electronic Signature(s) Signed: 05/19/2019 5:45:39 PM By: Carlene Coria RN Entered By: Carlene Coria on 05/19/2019 15:50:32 -------------------------------------------------------------------------------- Patient/Caregiver Education Details Patient Name: Sarah [name] 12/28/2020andnbsp2:45 Date of Service: PM Medical Record [id number] Number: Patient Account Number: [id number] Treating RN: 19-Mar-1933 (85 y.o. Levan Hurst Date of Birth/Gender: Female) Other Clinician: Primary Care Treating Primera, Hal Bernerd Pho Physician: Physician/Extender: Referring Physician: Serita Grammes in Treatment: 0 Education Assessment Education Provided To: Patient Education Topics Provided Venous: Methods: Explain/Verbal Responses: State content correctly Wound/Skin Impairment: Handouts: Caring for Your Ulcer, Skin Care Do's and Dont's Methods: Explain/Verbal, Printed Responses: State content correctly Electronic Signature(s) Signed: 05/19/2019 6:10:06 PM By: Levan Hurst RN, BSN Entered By: Levan Hurst on 05/19/2019 17:57:55 -------------------------------------------------------------------------------- Wound Assessment Details Patient Name: Date of  Service:  Sarah [name], Sarah [name] 05/19/2019 2:45 PM Medical Record [id number] Patient Account Number: [id number] Date of Birth/Sex: Treating RN: 1933/02/21 (85 y.o. Female) Epps, Morey Hummingbird Primary Care Pranish Akhavan: Lajean Manes T Other Clinician: Referring Jeremy Ditullio: Treating Cassandra Harbold/Extender:Robson, [name], Hal T Weeks in Treatment: 0 Wound Status Wound Number: 1 Primary Venous Leg Ulcer Etiology: Wound Location: Left Lower Leg - Anterior Wound Open Wounding Event: Gradually Appeared Status: Date Acquired: 12/21/2018 Comorbid Hypertension, Received Chemotherapy, Weeks Of Treatment: 0 History: Received Radiation Clustered Wound: No Photos Wound Measurements Length: (cm) 2 % Reducti Width: (cm) 1.8 % Reducti Depth: (cm) 0.3 Epithelia Area: (cm) 2.827 Tunnelin Volume: (cm) 0.848 Undermin Wound Description Classification: Full Thickness Without Exposed Support Foul Odo Structures Slough/F Exudate Medium Amount: Exudate Serosanguineous Type: Exudate red, brown Color: Wound Bed Granulation Amount: Small (1-33%) Granulation Quality: Pink Fascia E Necrotic Amount: Large (67-100%) Fat Laye Necrotic Quality: Adherent Slough Tendon E Muscle E Joint Ex Bone Exp r After Cleansing: No ibrino Yes Exposed Structure xposed: No r (Subcutaneous Tissue) Exposed: Yes xposed: No xposed: No posed: No osed: No on in Area: 0% on in Volume: 0% lization: None g: No ing: No Treatment Notes Wound #1 (Left, Anterior Lower Leg) 1. Cleanse With Wound Cleanser 2. Periwound Care Skin Prep 3. Primary Dressing Applied Santyl 4. Secondary Dressing Foam Border Dressing Electronic Signature(s) Signed: 05/20/2019 3:43:24 PM By: Mikeal Hawthorne EMT/HBOT Signed: 05/20/2019 5:43:27 PM By: Carlene Coria RN Previous Signature: 05/19/2019 5:45:39 PM Version By: Carlene Coria RN Entered By: Mikeal Hawthorne on 05/20/2019  14:23:02 -------------------------------------------------------------------------------- Vitals Details Patient Name: Date of Service: Sarah [name] 05/19/2019 2:45 PM Medical Record [id number] Patient Account Number: [id number] Date of Birth/Sex: Treating RN: 09-Mar-1933 (85 y.o. Female) Carlene Coria Primary Care Arno Cullers: Lajean Manes T Other Clinician: Referring Kella Splinter: Treating Maribel Hadley/Extender:Robson, [name], Hal T Weeks in Treatment: 0 Vital Signs Time Taken: 15:19 Temperature (F): 98.3 Height (in): 64 Pulse (bpm): 74 Source: Stated Respiratory Rate (breaths/min): 18 Weight (lbs): 111 Blood Pressure (mmHg): 143/71 Source: Stated Reference Range: 80 - 120 mg / dl Body Mass Index (BMI): 19.1 Electronic Signature(s) Signed: 05/19/2019 5:45:39 PM By: Carlene Coria RN Entered By: Carlene Coria on 05/19/2019 15:21:44

## 2019-05-27 ENCOUNTER — Encounter: Payer: Self-pay | Admitting: Podiatry

## 2019-05-27 ENCOUNTER — Ambulatory Visit (INDEPENDENT_AMBULATORY_CARE_PROVIDER_SITE_OTHER): Payer: Medicare Other | Admitting: Podiatry

## 2019-05-27 ENCOUNTER — Other Ambulatory Visit: Payer: Self-pay

## 2019-05-27 DIAGNOSIS — M79674 Pain in right toe(s): Secondary | ICD-10-CM

## 2019-05-27 DIAGNOSIS — M79675 Pain in left toe(s): Secondary | ICD-10-CM | POA: Diagnosis not present

## 2019-05-27 DIAGNOSIS — B351 Tinea unguium: Secondary | ICD-10-CM

## 2019-05-27 NOTE — Patient Instructions (Addendum)

## 2019-05-28 ENCOUNTER — Ambulatory Visit (HOSPITAL_COMMUNITY)
Admission: RE | Admit: 2019-05-28 | Discharge: 2019-05-28 | Disposition: A | Discharge status: Home / Self Care | Payer: Medicare Other | Source: Ambulatory Visit | Attending: Hematology | Admitting: Hematology

## 2019-05-28 DIAGNOSIS — I700 Atherosclerosis of aorta: Secondary | ICD-10-CM | POA: Insufficient documentation

## 2019-05-28 DIAGNOSIS — C8440 Peripheral T-cell lymphoma, not classified, unspecified site: Secondary | ICD-10-CM

## 2019-05-28 DIAGNOSIS — I2510 Atherosclerotic heart disease of native coronary artery without angina pectoris: Secondary | ICD-10-CM | POA: Diagnosis not present

## 2019-05-28 DIAGNOSIS — R911 Solitary pulmonary nodule: Secondary | ICD-10-CM | POA: Insufficient documentation

## 2019-05-28 DIAGNOSIS — Z5111 Encounter for antineoplastic chemotherapy: Secondary | ICD-10-CM | POA: Insufficient documentation

## 2019-05-28 LAB — GLUCOSE, CAPILLARY: Glucose-Capillary: 90 mg/dL (ref 70–99)

## 2019-05-28 MED ORDER — FLUDEOXYGLUCOSE F - 18 (FDG) INJECTION
5.5300 | Freq: Once | INTRAVENOUS | Status: AC | PRN
  Administered 2019-05-28: 5.53 via INTRAVENOUS

## 2019-06-01 NOTE — Progress Notes (Signed)
Subjective: Sarah [name] presents to clinic today with cc of  painful, thick, discolored, elongated toenails of both feet that become tender and patient cannot cut because of thickness. Pain is aggravated when wearing enclosed shoe gear and relieved with periodic professional debridement.  Patient voices no new pedal concerns on today's visit.  Medications reviewed in chart.  Allergies  Allergen Reactions  . Codeine Other (See Comments)    Reaction not recalled  . Latex   . Thiazide-Type Diuretics Other (See Comments)    Blurry vision?? (patient stated she has macular degeneration)     Objective:  Physical Examination:  Vascular Examination: Capillary refill time to digits <3 seconds b/l.  DP pulses faintly palpable b/l.   PT pulses faintly palpable b/l.  Digital hair absent b/l.   No edema noted b/l.  Skin temperature gradient WNL b/l.  Dermatological Examination: Skin with normal turgor, texture and tone b/l.  No open wounds b/l.  No interdigital macerations noted b/l.  Elongated, thick, discolored brittle toenails with subungual debris and pain on dorsal palpation of nailbeds left hallux and 2-5 b/l.  Anonychia right great toe(s) with evidence of permanent total nail avulsion. Nailbed(s) completely epithelialized and intact.  Musculoskeletal Examination: Muscle strength 5/5 to all muscle groups b/l.  Hammertoes lesser digits b/l.  No pain, crepitus or joint discomfort with active/passive ROM.  Neurological Examination: Sensation intact 5/5 b/l with 10 gram monofilament.  Vibratory sensation intact b/l.  Proprioceptive sensation intact b/l.  Assessment: Mycotic nail infection with pain left hallux and 2-5 b/l  Plan: 1. Toenails left hallux and 2-5 b/l were debrided in length and girth without iatrogenic laceration. 2.  Continue soft, supportive shoe gear daily. 3.  Report any pedal injuries to medical professional. 4.  Follow up 3 months. 5.   Patient/POA to call should there be a question/concern in there interim.

## 2019-06-02 ENCOUNTER — Encounter (HOSPITAL_BASED_OUTPATIENT_CLINIC_OR_DEPARTMENT_OTHER): Payer: Medicare Other | Admitting: Internal Medicine

## 2019-06-02 ENCOUNTER — Other Ambulatory Visit: Payer: Self-pay

## 2019-06-02 DIAGNOSIS — J910 Malignant pleural effusion: Secondary | ICD-10-CM | POA: Diagnosis not present

## 2019-06-02 DIAGNOSIS — Z86718 Personal history of other venous thrombosis and embolism: Secondary | ICD-10-CM | POA: Insufficient documentation

## 2019-06-02 DIAGNOSIS — Z86711 Personal history of pulmonary embolism: Secondary | ICD-10-CM | POA: Insufficient documentation

## 2019-06-02 DIAGNOSIS — Z7901 Long term (current) use of anticoagulants: Secondary | ICD-10-CM | POA: Diagnosis not present

## 2019-06-02 DIAGNOSIS — C865 Angioimmunoblastic T-cell lymphoma: Secondary | ICD-10-CM | POA: Diagnosis not present

## 2019-06-02 DIAGNOSIS — Z853 Personal history of malignant neoplasm of breast: Secondary | ICD-10-CM | POA: Diagnosis not present

## 2019-06-02 DIAGNOSIS — I872 Venous insufficiency (chronic) (peripheral): Secondary | ICD-10-CM | POA: Diagnosis not present

## 2019-06-02 DIAGNOSIS — L97822 Non-pressure chronic ulcer of other part of left lower leg with fat layer exposed: Secondary | ICD-10-CM | POA: Insufficient documentation

## 2019-06-03 NOTE — Progress Notes (Signed)
HEMATOLOGY/ONCOLOGY CLINIC NOTE  Date of Service: 06/04/2019  Patient Care Team: Lajean Manes, MD as PCP - General (Internal Medicine) Buford Dresser, MD as PCP - Cardiology (Cardiology)  REFERRING PHYSICIAN: Lajean Manes, MD  CHIEF COMPLAINTS/PURPOSE OF CONSULTATION:  Recently Diagnosed Angioimmunoblastic T cell lymphoma      HISTORY OF PRESENTING ILLNESS:   Sarah [name] is a wonderful 86 y.o. female who has been referred to [place] by Lajean Manes, MD for evaluation and management of Possible lymphoma . She is accompanied today by her daughter Sarah [name] . The pt reports that she is doing well overall.   Pending Surgical pathology from 03/17/2019 Dr.Manny will contact clinic about pathology   August 25th/27th she began coughing and sneezing. On Sept 3 went to doctors and they said it was allergies. She began having rashes (little red dots). Went to PCP and they prescribed prednisone and for several days and the medication helped. But as soon as she completed the prescription symptoms reoccurred.   She had a chest X Ray done because she was having a severe cough on 02/17/2019 revealing "bilateral effusions in this patient with history of breast cancer,of uncertain significance with question of right lower lobe pulmonary nodule, consider chest CT for further assessment. Atherosclerotic changes in the thoracic aorta."  Dr. Amy Martinique biopsied the rashes on her back 02/21/2019 and it was determined that it could be because of an allergy to medication. Dr.Stoneking stopped her blood pressure medication from September- October but ther rash still present. The itching started 1 to 2 months ago. The rash would come and go and was mostly on her back. She still has rash on her left thigh.  She is using triaconazole and cetrizine.  She has lumps behind ear and abdomin and she states that they have swollen twice the size within 1 and half months  She has no appetite and has changes in  her taste that began around August. She has changes in bowels. They are more loose and smaller In size. She also has no control over bowels.  She feels fatigued and it has increased since August.   She has a cough that starts in September  that has made her horse.   She had a CT scan on 02/27/2019 because she was having severe cough and her stomach was very extended. Revealing "1. Extensive lymphadenopathy throughout the chest, abdomen, and pelvis, with index lymph nodes identified above. Splenomegaly. Findings are most consistent with lymphoma with recurrent, metastatic breast malignancy less favored. 2. There is generally osteopenic appearance of the skeleton. There are numerous subtle hypodense lesions, particularly of the pelvis (e.g. Series 2, image 88) and select vertebral bodies, for example L4 (series 8, image 92). This is suspicious although not definitive for osseous metastatic disease. Characterization for metabolic activity by nuclear scintigraphic bone scan or PET-CT would be helpful to further evaluate. 3. Moderate right, small left pleural effusions with associated atelectasis or consolidation. Subpleural radiation fibrosis of the anterior right lung. 4. There is a new subpleural pulmonary nodule of the anterior right middle lobe measuring 6 mm (series 4, image 105), nonspecific. There is no obvious pleural thickening or nodularity definitive for metastatic disease. 5.  Small volume ascites. 6. There is a fluid attenuation lesion of the pancreatic uncinate measuring 2.4 x 1.3 cm (series 2, image 63), not substantially changed when compared to remote prior MR examination dated 02/23/2012 and likely sequelae of a prior pseudocyst versus incidental pancreatic IPMN. 7.  Cardiomegaly."  Hospitalized from 03/06/2019-03/08/2019. She presenting with acute shortness of breath, leg swelling,left hand swelling and some weight gain despite poor appetite. In ED, CTA chest revealed at least submassive PE,  moderate right pleural effusion, diffuse LAD concerning for lymphoma. Started on heparin drip. PCCM consulted for guidance. She had thoracocentesis with removal of 1200 cc cloudy exudative pleural fluid. Cardiology consulted for elevated which was thought to be demand ischemia from right heart strain in the setting of PE. Patient was transitioned to subcu Lovenox by pulmonology the next day and remained stable. Unusual appearance of tracheal air column at the thoracic inlet. Correlate with any history of swallowing difficulty or changes in voice with dedicated imaging with chest CT and or CT of the neck as indicated.  She has a nonhealing ulcer on her right tibialis anterior that starting weeping one week ago. Of note since the patient's last visit, pt has had CHEST XRAY - 2 VIEW (Accession [id number]) completed on 02/17/2019 with results revealing "Bilateral effusions in this patient with history of breast cancer,of uncertain significance with question of right lower lobepulmonary nodule, consider chest CT for furtherassessment.Unusual appearance of tracheal air column at the thoracic inlet.Correlate with any history of swallowing difficulty or changes in voice with dedicated imaging with chest CT and or CT of the neck as Indicated. Atherosclerotic changes in the thoracic aorta."  Of note since the patient's last visit, pt has had CT CHEST, ABDOMEN, AND PELVIS WITH CONTRAST (Accession [id number]) completed on 02/27/2019 with results revealing "1. Extensive lymphadenopathy throughout the chest, abdomen, and pelvis, with index lymph nodes identified above. Splenomegaly.Findings are most consistent with lymphoma with recurrent, metastatic breast malignancy less favored. 2. There is generally osteopenic appearance of the skeleton. There are numerous subtle hypodense lesions, particularly of the pelvis (e.g. Series 2, image 88) and select vertebral bodies, for example L4 (series 8, image 92). This is  suspicious although not definitive for osseous metastatic disease. Characterization for metabolic activity by nuclear scintigraphic bone scan or PET-CT would be helpful to further evaluate.  3. Moderate right, small left pleural effusions with associated atelectasis or consolidation. Subpleural radiation fibrosis of the anterior right lung.4. There is a new subpleural pulmonary nodule of the anterior right middle lobe measuring 6 mm (series 4, image 105), nonspecific. There is no obvious pleural thickening or nodularity definitive for metastatic disease.5.  Small volume ascites. 6. There is a fluid attenuation lesion of the pancreatic uncinate measuring 2.4 x 1.3 cm (series 2, image 63), not substantially changed when compared to remote prior MR examination dated 02/23/2012 and likely sequelae of a prior pseudocyst versus incidental pancreatic IPMN.7.  Cardiomegaly.8. Other chronic and incidental findings as detailed above. Aortic Atherosclerosis (ICD10-I70.0)."  Of note since the patient's last visit, pt has had PORTABLE CHEST 1 VIEW (Accession [id number]) completed on 03/06/2019 with results revealing "1. Moderate size right and small left pleural effusions appear not significantly changed since 02/27/19. 2. No new cardiopulmonary abnormality."  Of note since the patient's last visit, pt has had ECHOCARDIOGRAM  completed on 03/06/2019 with results revealing  "1. Left ventricular ejection fraction, by visual estimation, is 60 to 65%. The left ventricle has normal function. Normal left ventricular size. There is no left ventricular hypertrophy. 2. Left ventricular diastolic Doppler parameters are consistent with impaired relaxation pattern of LV diastolic filling. 3. Global right ventricle has mildly reduced systolic function.The right ventricular size is moderately enlarged. No increase in right ventricular wall thickness. 4. Left atrial size was normal. 5. Right  atrial size was mildly dilated. 6.  Trivial pericardial effusion is present. 7. The mitral valve is normal in structure. Trace mitral valve regurgitation. No evidence of mitral stenosis. 8. The tricuspid valve is normal in structure. Tricuspid valve regurgitation severe. 9. The aortic valve is normal in structure. Aortic valve regurgitation was not visualized by color flow Doppler. Structurally normal aortic valve, with no evidence of sclerosis or stenosis.10. The pulmonic valve was normal in structure. Pulmonic valve regurgitation is mild by color flow Doppler.11. Mildly elevated pulmonary artery systolic pressure.12. The inferior vena cava is normal in size with greater than 50% respiratory variability, suggesting right atrial pressure of 3 mmHg."  Of note since the patient's last visit, pt has had CT ANGIOGRAPHY CHEST WITH CONTRAST (Accession [id number]) completed on 03/06/2019 with results revealing "1. Pulmonary embolus arising from the distal left main pulmonary artery with extension into multiple left lower lobe pulmonary arterial branches. There is also incompletely obstructing pulmonary embolus in the proximal left upper lobe pulmonary artery. More distal pulmonary embolus in the right lower lobe pulmonary artery. Positive for acute PE with CT evidence of right heart strain (RV/LV Ratio = 1.6) consistent with at least submassive (intermediate risk) PE. The presence of right heart strain has been associated with an increased risk of morbidity and mortality. Please activate Code PE by paging [phone]. 2. Sizable pleural effusion on the right with smaller pleural effusion on the left. Consolidation and compressive atelectasis throughout most of the right lower lobe. Milder atelectasis left base. Nodular opacities on the right, likely metastatic foci.3.  Stable adenopathy compared to 1 week prior.4. Enlargement of spleen, incompletely visualized but documented CT 1 week prior. Concern for potential lymphoma. 5. Aortic atherosclerosis. No  aneurysm or dissection evident. Foci of coronary artery calcification noted."  Most recent lab results (03/06/2019) of CBC is as follows: all values are WNL except for Platelets at 72, Eosinophils Absolute at 0.6,Abs Immature Granulocytes at 0.08 .  On review of systems, pt reports rashes, digestive issues and denies back pain, abdominal pain and any other symptoms.   On PMHx the pt reports Hypothyroidism, MCI, Hypercholesterolemia, Tracheal anomaly, Hypertension, Pulmonary nodule, Atherosclerosis of aorta, anxiety, gait disorder, primary insomnia.   INTERVAL HISTORY: Sarah [name] is a 86 y.o. female here for evaluation and management of Angioimmunoblastic T-cell lymphoma. She is here for C3D1 of A-AVD. We are joined by her daughter today. The patient's last visit with [place] was on 05/14/2019. The pt reports that she is doing well overall.  The pt reports she has to use the restroom very frequently at night, about 4-5 times. Pt has been a little tired but denies other new issues at this time. The ulcer on her left leg has been about the same. She went to the [facility] on Monday and was told that they would like to scrape the area. Pt is not keen on this option. The ulcer can be painful if it is touched. She has continued using Santyl and changing the dressing daily. She is open to receiving the COVID19 vaccine but is having difficulty getting an appointment. Pt has continued to take her blood thinners as prescribed. She has been staying active by walking with her daughter and doing an at-home workout for seniors. Pt has noticed some changes in the size of her bowel movements but denies any constipation.  Of note since the patient's last visit, pt has had PET/CT ([id number]) completed on 05/28/2019 with results revealing "1. Complete metabolic response  to therapy. 2. Resolution of bilateral pleural effusions. 3. Right lower lobe pulmonary nodule is similar. A nodule along the right minor fissure  is felt to be new since the prior. Recommend attention on follow-up. 4. Coronary artery atherosclerosis. Aortic Atherosclerosis (ICD10-I70.0). 5. Similar thyroid hypermetabolism, which can be seen with thyroiditis."  Lab results today (06/04/19) of CBC w/diff and CMP is as follows: all values are WNL except for RDW at 16.7, Lymphs Abs at 0.5K, Abs Immature Granulocytes at 0.08K, Glucose at 104, BUN at 26, AST at 14. 06/04/2019 Magnesium at 1.9  On review of systems, pt reports polyuria, painful left leg ulcer, fatigue and denies fevers, chills, skin rashes, tingling/numbness in hands/feet, SOB, constipation and any other symptoms.   MEDICAL HISTORY:  Past Medical History:  Diagnosis Date  . Allergy    codeine, thiazides  . Anxiety    new dx  . Arthritis   . Breast cancer ([place]) 03/14/12   bx=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,ER/PR=+,upper inner quad  . Cancer ([place])    breast  . Cataract   . DVT (deep venous thrombosis) ([place]) 02/2019   left leg  . Dyspnea   . Edema    both legs feet and toe, abdomen  . Glaucoma    laser treated years ago  . HOH (hard of hearing)   . Hyperlipemia   . Hypertension   . Hypothyroidism   . Pancreatic cyst    benign  . PONV (postoperative nausea and vomiting)   . Pulmonary embolism ([place]) 02/2019   bilateral   . Radiation 06/11/2012-07/12/2012   17 sessions 4250 cGy, 3 sessions 750 cGy  . Vertigo   . Wears glasses   . Wears partial dentures    partial upper     SURGICAL HISTORY: Past Surgical History:  Procedure Laterality Date  . ABDOMINAL HYSTERECTOMY  1966   1/2 ovary left in   . BREAST SURGERY  1992   lumpectomy-lt  . CATARACT EXTRACTION     b/l  . COLONOSCOPY    . EXCISION MASS NECK Left 03/17/2019    EXCISION MASS NECK (Left Neck)  . EXCISION MASS NECK Left 03/17/2019   Procedure: EXCISION MASS NECK;  Surgeon: Helayne Seminole, MD;  Location: [facility];  Service: ENT;  Laterality: Left;  . EYE SURGERY  Bilateral    bilateral cataract removal  . IR IMAGING GUIDED PORT INSERTION  04/23/2019  . JOINT REPLACEMENT  2013   rt total knee  . JOINT REPLACEMENT  1995   lt total knee  . PARTIAL MASTECTOMY WITH NEEDLE LOCALIZATION  04/09/2012   Procedure: PARTIAL MASTECTOMY WITH NEEDLE LOCALIZATION;  Surgeon: Adin Hector, MD;  Location: [facility];  Service: General;  Laterality: Right;  . SKIN BIOPSY Left 03/17/2019   LEFT THIGH  . SKIN BIOPSY Left 03/17/2019   Procedure: Skin Biopsy Left Thigh;  Surgeon: Helayne Seminole, MD;  Location: [facility];  Service: ENT;  Laterality: Left;  . TONSILLECTOMY    . TOTAL KNEE ARTHROPLASTY  08/16/2011   Procedure: TOTAL KNEE ARTHROPLASTY;  Surgeon: Ninetta Lights, MD;  Location: [facility];  Service: Orthopedics;  Laterality: Right;     SOCIAL HISTORY: Social History   Socioeconomic History  . Marital status: Widowed    Spouse name: Not on file  . Number of children: 2  . Years of education: Not on file  . Highest education level: Not on file  Occupational History  . Occupation: Retired  Comment: Community education officer   Tobacco Use  . Smoking status: Never Smoker  . Smokeless tobacco: Never Used  Substance and Sexual Activity  . Alcohol use: No  . Drug use: No  . Sexual activity: Not Currently  Other Topics Concern  . Not on file  Social History Narrative  . Not on file   Social Determinants of Health   Financial Resource Strain:   . Difficulty of Paying Living Expenses: Not on file  Food Insecurity:   . Worried About Charity fundraiser in the Last Year: Not on file  . Ran Out of Food in the Last Year: Not on file  Transportation Needs:   . Lack of Transportation (Medical): Not on file  . Lack of Transportation (Non-Medical): Not on file  Physical Activity:   . Days of Exercise per Week: Not on file  . Minutes of Exercise per Session: Not on file  Stress:   . Feeling of Stress : Not on file  Social Connections:   .  Frequency of Communication with Friends and Family: Not on file  . Frequency of Social Gatherings with Friends and Family: Not on file  . Attends Religious Services: Not on file  . Active Member of Clubs or Organizations: Not on file  . Attends Archivist Meetings: Not on file  . Marital Status: Not on file  Intimate Partner Violence:   . Fear of Current or Ex-Partner: Not on file  . Emotionally Abused: Not on file  . Physically Abused: Not on file  . Sexually Abused: Not on file     FAMILY HISTORY: Family History  Problem Relation Age of Onset  . Heart disease Father   . Cancer Maternal Aunt        stomach     ALLERGIES:   is allergic to codeine; latex; and thiazide-type diuretics.   MEDICATIONS:  Current Outpatient Medications  Medication Sig Dispense Refill  . amLODipine (NORVASC) 2.5 MG tablet Take 2.5-5 mg by mouth See admin instructions. 5 mg in the morning, 2.5 mg in the evening    . apixaban (ELIQUIS) 5 MG TABS tablet Take 1 tablet (5 mg total) by mouth 2 (two) times daily. (Patient taking differently: Take 5 mg by mouth 2 (two) times daily. ) 60 tablet 11  . Cholecalciferol (VITAMIN D) 50 MCG (2000 UT) tablet Take 2,000 Units by mouth 2 (two) times daily.    . Cyanocobalamin (VITAMIN B-12 PO) Take 3,000 mcg by mouth daily.     Marland Kitchen gabapentin (NEURONTIN) 100 MG capsule Take 2 capsules (200 mg total) by mouth at bedtime. 60 capsule 2  . levothyroxine (SYNTHROID) 25 MCG tablet Take 25 mcg by mouth daily before breakfast.     . lidocaine-prilocaine (EMLA) cream Apply to affected area once 30 g 3  . Multiple Vitamins-Minerals (PRESERVISION AREDS PO) Take 1 capsule by mouth 2 (two) times daily.     . ondansetron (ZOFRAN) 8 MG tablet Take 1 tablet (8 mg total) by mouth 2 (two) times daily as needed. Start on the third day after chemotherapy. 30 tablet 1  . polyethylene glycol (MIRALAX / GLYCOLAX) 17 g packet Take 17 g by mouth daily.    . predniSONE (DELTASONE) 50 MG  tablet Take 50 mg tablet po daily for 5 days starting day after treatment 5 tablet 4  . prochlorperazine (COMPAZINE) 10 MG tablet Take 1 tablet (10 mg total) by mouth every 6 (six) hours as needed (Nausea or vomiting). 30 tablet  1  . zolpidem (AMBIEN) 10 MG tablet Take 2.5 mg by mouth at bedtime.     . Calcium Carbonate (CALCIUM 600 PO) Take 600 mg by mouth daily.    . cetirizine (ZYRTEC) 10 MG tablet Take 10 mg by mouth daily.     . Cyanocobalamin (B-12 PO) Take by mouth.    . erythromycin ophthalmic ointment SMARTSIG:1 Sparingly Right Eye 4 Times Daily    . fluticasone furoate-vilanterol (BREO ELLIPTA) 100-25 MCG/INH AEPB Inhale 1 puff into the lungs daily. (Patient not taking: Reported on 04/25/2019) 60 each 0  . mupirocin ointment (BACTROBAN) 2 % Place 1 application into the nose 2 (two) times daily. 22 g 0  . triamcinolone cream (KENALOG) 0.1 % Apply 1 application topically 3 (three) times daily.     . vitamin E 400 UNIT capsule Take by mouth.     No current facility-administered medications for this visit.     REVIEW OF SYSTEMS:   A 10+ POINT REVIEW OF SYSTEMS WAS OBTAINED including neurology, dermatology, psychiatry, cardiac, respiratory, lymph, extremities, GI, GU, Musculoskeletal, constitutional, breasts, reproductive, HEENT.  All pertinent positives are noted in the HPI.  All others are negative.   PHYSICAL EXAMINATION: ECOG FS:1 - Symptomatic but completely ambulatory  Vitals:   06/04/19 0945  BP: 134/72  Pulse: 86  Resp: 18  Temp: 97.8 F (36.6 C)  SpO2: 99%   Wt Readings from Last 3 Encounters:  06/04/19 112 lb (50.8 kg)  05/14/19 111 lb 11.2 oz (50.7 kg)  05/08/19 109 lb 6.4 oz (49.6 kg)   Body mass index is 19.22 kg/m.    Exam was given in a chair   GENERAL:alert, in no acute distress and comfortable SKIN: no acute rashes, no significant lesions EYES: conjunctiva are pink and non-injected, sclera anicteric OROPHARYNX: MMM, no exudates, no oropharyngeal  erythema or ulceration NECK: supple, no JVD LYMPH:  no palpable lymphadenopathy in the cervical, axillary or inguinal regions LUNGS: clear to auscultation b/l with normal respiratory effort HEART: regular rate & rhythm ABDOMEN:  normoactive bowel sounds , non tender, not distended. No palpable hepatosplenomegaly.  Extremity: no pedal edema, Unhealing ulcer on mid lower left leg accompanied by some cellulitis, swelling, and discoloration PSYCH: alert & oriented x 3 with fluent speech NEURO: no focal motor/sensory deficits  LABORATORY DATA:  I have reviewed the data as listed  CBC Latest Ref Rng & Units 06/04/2019 05/14/2019 05/08/2019  WBC 4.0 - 10.5 K/uL 6.9 7.7 10.6(H)  Hemoglobin 12.0 - 15.0 g/dL 12.2 12.5 12.8  Hematocrit 36.0 - 46.0 % 37.4 38.9 40.2  Platelets 150 - 400 K/uL 248 249 177    CMP Latest Ref Rng & Units 06/04/2019 05/14/2019 05/08/2019  Glucose 70 - 99 mg/dL 104(H) 96 84  BUN 8 - 23 mg/dL 26(H) 30(H) 22  Creatinine 0.44 - 1.00 mg/dL 0.71 0.71 0.71  Sodium 135 - 145 mmol/L 143 140 142  Potassium 3.5 - 5.1 mmol/L 3.8 4.1 4.3  Chloride 98 - 111 mmol/L 106 102 105  CO2 22 - 32 mmol/L 27 29 26   Calcium 8.9 - 10.3 mg/dL 9.4 9.9 9.5  Total Protein 6.5 - 8.1 g/dL 6.5 6.8 7.0  Total Bilirubin 0.3 - 1.2 mg/dL 0.5 0.4 0.4  Alkaline Phos 38 - 126 U/L 85 86 111  AST 15 - 41 U/L 14(L) 12(L) 12(L)  ALT 0 - 44 U/L 8 9 8    Component     Latest Ref Rng & Units 03/27/2019  Hepatitis B  Surface Ag     NON REACTIVE NON REACTIVE  Hep B Core Total Ab     NON REACTIVE NON REACTIVE  HCV Ab     NON REACTIVE NON REACTIVE  LDH     98 - 192 U/L 252 (H)     04/08/2019 NM PET Image Initial (PI) Skull Base To Thigh (Accession [id number])  04/04/2019 PATHOLOGY   04/07/2019 ECHOCARDIOGRAM  03/28/2019 PATHOLOGY   03/06/2019 CT ANGIOGRAPHY CHEST WITH CONTRAST (Accession [id number])  04/07/2019 ECHOCARDIOGRAM  03/06/2019 ECHOCARDIOGRAM     03/06/2019 PORTABLE CHEST 1 VIEW  (Accession [id number])    02/27/2019 CT CHEST, ABDOMEN, AND PELVIS WITH CONTRAST (Accession [id number])    02/17/2019 CHEST XRAY - 2 VIEW (Accession [id number])    RADIOGRAPHIC STUDIES: I have personally reviewed the radiological images as listed and agreed with the findings in the report. NM PET Image Restag (PS) Skull Base To Thigh  Result Date: 05/28/2019 CLINICAL DATA:  Subsequent treatment strategy for T-cell lymphoma. Status post chemo/immunotherapy. EXAM: NUCLEAR MEDICINE PET SKULL BASE TO THIGH TECHNIQUE: 5.5 mCi F-18 FDG was injected intravenously. Full-ring PET imaging was performed from the skull base to thigh after the radiotracer. CT data was obtained and used for attenuation correction and anatomic localization. Fasting blood glucose: 90 mg/dl COMPARISON:  04/08/2019 FINDINGS: Mediastinal blood pool activity: SUV max 1.6 Liver activity: SUV max 2.8 NECK: Extensive muscular hypermetabolism, likely related to motion after radiopharmaceutical injection. Given this mild limitation, no residual cervical nodal hypermetabolism. Diffuse, left greater than right thyroid hypermetabolism, including at a S.U.V. max of 5.9. Similar to on the prior. Incidental CT findings: No residual cervical adenopathy. Index left posterior triangle node measures 4 mm on 36/4 versus 1.0 cm on the prior exam Bilateral carotid atherosclerosis. CHEST: No residual thoracic nodal hypermetabolism. No pulmonary parenchymal hypermetabolism. Incidental CT findings: Resolution of thoracic adenopathy. Right Port-A-Cath tip at superior caval/atrial junction. Aortic and coronary artery atherosclerosis. Right larger than left pleural effusions have resolved. Right upper lobe anterior subpleural radiation fibrosis. A vague nodule along the right minor fissure of 4 mm on 38/8 is not readily apparent on the prior. A right lower lobe subpleural 5 mm pulmonary nodule on 47/8 is similar. Lingular scarring. ABDOMEN/PELVIS: No residual  hypermetabolic nodes within the abdomen or pelvis. Incidental CT findings: Index left periaortic node of 7 mm on 119/4 is at the site of a 2.3 cm nodal conglomerate on the prior. Left renal cyst. Index left inguinal node measures 5 mm on 170/4 versus 1.0 cm previously (when remeasured). Pelvic floor laxity. Hysterectomy. SKELETON: Mild diffuse marrow hypermetabolism is likely related to stimulation by chemotherapy. Incidental CT findings: none IMPRESSION: 1. Complete metabolic response to therapy. 2. Resolution of bilateral pleural effusions. 3. Right lower lobe pulmonary nodule is similar. A nodule along the right minor fissure is felt to be new since the prior. Recommend attention on follow-up. 4. Coronary artery atherosclerosis. Aortic Atherosclerosis (ICD10-I70.0). 5. Similar thyroid hypermetabolism, which can be seen with thyroiditis. Electronically Signed   By: Abigail Miyamoto M.D.   On: 05/28/2019 12:03     ASSESSMENT & PLAN:   KAELANI KENDRICK is a 86 y.o. female with:  1. 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 SKIN, LEFT THIGH, BIOPSY: - Involvement by angioimmunoblastic T-cell lymphoma." -04/07/2019 Echocardiogram - normal EF -NM PET Image Initial (PI) Skull Base To Thigh (Accession [id number]) completed on 04/08/2019 with results revealing "1.  Widespread hypermetabolic adenopathy in the neck, chest, and abdomen/pelvis, primarily in the Deauville 4 and Deauville 5 range. There is also hypermetabolic activity along the posterior nasopharynx in lingual tonsillar regions potentially indicating sites of involvement. 2. The subtle hypodensities in the spine and bony pelvis are not appreciably hypermetabolic may be incidental, surveillance is suggested. 3. Bilateral thyroid activity but especially on the left. Probably from thyroiditis. 4. A 6 mm subpleural  nodule anteriorly in the right lower lobe not appreciably hypermetabolic but is below sensitive PET-CT size thresholds and merits surveillance. 5. Mildly accentuated diffuse splenic activity without splenomegaly or focal splenic lesion identified. 6. Other imaging findings of potential clinical significance: Aortic Atherosclerosis (ICD10-I70.0). Coronary atherosclerosis. Large right and small left pleural effusions. Moderate cardiomegaly. Small pericardial effusion. Mild mesenteric edema. Large left renal cyst. Fullness of both renal collecting systems with suspected nonobstructive left nephrolithiasis. Degenerative grade 1 anterolisthesis at L4-5 at L5-S1. Trace pelvic ascites."   2. Pulmonary Embolism -extensive .likely related to extensive malignancy  3. H/o Breast Cancer  -The patient had bilateral diagnostic  mammography at [facility] 07/11/2011. This  showed some calcifications in the right  breast, which seemed a little bit more  prominent than prior. In the left breast  there was an area of possible  architectural distortion. However left  breast ultrasound the same day showed  no abnormality. 6 month followup was  suggested, and on 02/28/2012 the  patient again had bilateral diagnostic  mammography, now with right  ultrasonography. The microcalcifications  in the right breast appeared increased.  Ultrasound showed a hypoechoic lesion  measuring 5 mm, with no associated  shadowing. This had been previously  noted and appeared unchanged. Anterior  to that there was a small hypoechoic  mass measuring 4 mm in diameter.  There was felt to be suspicious, and on  03/14/2012 the patient underwent biopsy  of the right breast mass, showing ([id number]  [id number]) ductal carcinoma in situ,  intermediate grade, estrogen receptor  100% and progesterone receptor 100%  positive.   -Bilateral breast MRI was obtained  03/26/2012. This showed only post  biopsy changes in the right breast,  associated with an area of non-masslike   enhancement measuring 2.4 cm. The  left breast was unremarkable, and there  was no enlarged axillary or internal  mammary adenopathy noted.  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 The patient's  subsequent history is as detailed below.   PLAN: -Discussed pt labwork today, 06/04/19; blood counts are nml, blood chemistries look good, Magnesium is WNL -Discussed 01/06/2021PET/CT ([id number]) which revealed "Complete metabolic response to therapy". -Recommended pt receive COVID19 vaccine when available  -Advised pt that the response to the vaccine may be suppressed due to being on active treatment -Pt appears to have a Venous Ulcer, does not appear to be caused by lymphoma -Advised pt that the ulcer can be a source of infection-- no evidence of acute infection at this time. -Will continue to monitor LLE ulcer -The pt has no prohibitive toxicities from continuing C3D1 of A-AVD at this time  -Would recommend completing at least four cycles, pt would prefer this option -Would consider continuing C5 & C6 with just Adcentris  -Discussed again that an autologous transplant would not be a good option for pt due to age -Will return to clinic in 3 weeks  with labs   FOLLOW-UP: Please follow-up as scheduled for cycle 4 of chemotherapy port flush and labs on 06/25/2019  The total time spent in the appt was 30 minutes and more than 50% was on counseling and direct patient cares.  All of the patient's questions were answered with apparent satisfaction. The patient knows to call the clinic with any problems, questions or concerns.    Sullivan Lone MD [place] AAHIVMS [facility] Hematology/Oncology Physician [facility]  (Office):       [phone] (Work cell):  [phone] (Fax):           [phone]  06/04/2019 10:30 AM  I,  Yevette Edwards, am acting as a scribe for Dr. Sullivan Lone.   .I have reviewed the above documentation for accuracy and completeness, and I agree with the above. Brunetta Genera MD

## 2019-06-03 NOTE — Progress Notes (Signed)
Sarah, [name] ([id number]) Visit Report for 06/02/2019 Debridement Details Patient Name: Date of Service: Sarah, [name] 06/02/2019 10:15 AM Medical Record [id number] Patient Account Number: [id number] Date of Birth/Sex: Treating RN: 1933/03/22 (86 y.o. F) Primary Care Provider: Lajean Manes T Other Clinician: Referring Provider: Treating Provider/Extender:Yovan Leeman, [name], Hal T Weeks in Treatment: 2 Debridement Performed for Wound #1 Left,Anterior Lower Leg Assessment: Performed By: Clinician Levan Hurst, RN Debridement Type: Chemical/Enzymatic/Mechanical Agent Used: Santyl Severity of Tissue Pre Fat layer exposed Debridement: Level of Consciousness (Pre- Awake and Alert procedure): Pre-procedure Verification/Time Out Taken: No Start Time: 12:05 Bleeding: None End Time: 12:05 Procedural Pain: 0 Post Procedural Pain: 0 Response to Treatment: Procedure was tolerated well Level of Consciousness Awake and Alert (Post-procedure): Post Debridement Measurements of Total Wound Length: (cm) 2 Width: (cm) 1.7 Depth: (cm) 0.2 Volume: (cm) 0.534 Character of Wound/Ulcer Post Requires Further Debridement Debridement: Severity of Tissue Post Debridement: Fat layer exposed Post Procedure Diagnosis Same as Pre-procedure Electronic Signature(s) Signed: 06/02/2019 6:04:26 PM By: Linton Ham MD Entered By: Linton Ham on 06/02/2019 13:09:43 -------------------------------------------------------------------------------- HPI Details Patient Name: Date of Service: Sarah [name] 06/02/2019 10:15 AM Medical Record [id number] Patient Account Number: [id number] Date of Birth/Sex: Treating RN: [birth year] (86 y.o. F) Primary Care Provider: Patrina Levering Other Clinician: Referring Provider: Treating Provider/Extender:Makyna Niehoff, [name], Hal T Weeks in Treatment: 2 History of Present Illness HPI Description:  ADMISSION 05/19/2019 This is a an 86 year old woman who is referred from her oncologist Dr.Kale for review of a wound on her left anterior mid tibial area. although there are certain parts of the notes that accompany her and also in [place] link that suggests she had a wound on the right anterior mid tibia, the patient is fairly insistent that the current wound is the only wound she is ever had. She relates that this began as a small area on her left anterior mid tibia sometime in August i.e. clearly before the diagnosis or treatment of her lymphoma began. She said that has been gradually increasing in size over time she is completely unaware of any other precipitating factors such as trauma etc. She does have what looks to be changes of chronic venous insufficiency in her legs with some degree of hemosiderin deposition and edema but has never had problems with this before. The wound itself is exceptionally painful there is no drainage she has been using Bactroban on this twice a day with the help of her daughter. She was given a course of doxycycline last week by Dr. Donzetta Matters which she is tolerating well She also in September developed a widespread rash that was pruritic. She was seen by her primary doctor and dermatologist I believe. Ultimately she had a CT scan in early October that documented widespread adenopathy with splenomegaly.Marland Kitchen She was hospitalized from 03/06/2019 through 03/08/2019 presenting with shortness of breath leg swelling on the left. She was diagnosed with a pulmonary embolism with a DVT in the left leg this predominantly involve the left posterior tibial veins and the left gastrocnemius veins. She is on Eliquis since then. The patient was ultimately diagnosed with angioimmunoblastic T-cell lymphoma and she has been on chemotherapy including Adriamycin Cytoxan and brentuximab. She was receiving a course of this every 3 weeks. She stated that the first round of chemotherapy  caused her rash to go away. 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 So far she is tolerating her chemotherapy well although this will stretch out into the  beginning of March 2021 The patient's major complaint is the pain she has burning pain even at night that sometimes awakens her from sleep. Past medical history; lymphoma as noted above, breast CA with a lumpectomy in 2013, right total knee replacement, hypothyroidism, pulmonary embolism as described, DVT in the left leg also as described. ABI in our clinic was 1.26 on the left 1/11; patient arrives with the surface of this wound completely covered and adherent eschar. Her daughter was able to show me pictures on her cell phone that suggest that this was getting better however it certainly does not look like that today. She will not let me even get close to this for debridement. Even washing the surface of the wound off seems to be on her. Electronic Signature(s) Signed: 06/02/2019 6:04:26 PM By: Linton Ham MD Entered By: Linton Ham on 06/02/2019 13:11:09 -------------------------------------------------------------------------------- Physical Exam Details Patient Name: Date of Service: Sarah [name] 06/02/2019 10:15 AM Medical Record [id number] Patient Account Number: [id number] Date of Birth/Sex: Treating RN: 1933-02-04 (86 y.o. F) Primary Care Provider: Patrina Levering Other Clinician: Referring Provider: Treating Provider/Extender:Indigo Chaddock, [name], Hal T Weeks in Treatment: 2 Constitutional Sitting or standing Blood Pressure is within target range for patient.. Pulse regular and within target range for patient.Marland Kitchen Respirations regular, non-labored and within target range.. Temperature is normal and within the target range for the patient.Marland Kitchen Appears in no distress. Eyes Conjunctivae clear. No discharge.no icterus. Cardiovascular Pedal pulses are palpable on the  left. Not much in the way of edema is seen. Integumentary (Hair, Skin) No erythema around the wound. Notes Wound exam; the patient has a quarter sized punched out area on the left anterior mid tibia. Again tightly covered with adherent debris. No erythema around the wound. She will not even let me get near this to washout the wound surface let alone debride Electronic Signature(s) Signed: 06/02/2019 6:04:26 PM By: Linton Ham MD Entered By: Linton Ham on 06/02/2019 13:12:30 -------------------------------------------------------------------------------- Physician Orders Details Patient Name: Date of Service: Sarah [name] 06/02/2019 10:15 AM Medical Record [id number] Patient Account Number: [id number] Date of Birth/Sex: Treating RN: [birth year] (86 y.o. Raziyah Fetter Primary Care Provider: Lajean Manes T Other Clinician: Referring Provider: Treating Provider/Extender:Emonte Dieujuste, [name], Hal T Weeks in Treatment: 2 Verbal / Phone Orders: No Diagnosis Coding ICD-10 Coding Code Description [phone] Non-pressure chronic ulcer of other part of left lower leg with other specified severity I87.312 Chronic venous hypertension (idiopathic) with ulcer of left lower extremity C86.5 Angioimmunoblastic T-cell lymphoma Follow-up Appointments Return Appointment in 2 weeks. Dressing Change Frequency Wound #1 Left,Anterior Lower Leg Change dressing every day. Wound Cleansing Wound #1 Left,Anterior Lower Leg May shower and wash wound with soap and water. Primary Wound Dressing Wound #1 Left,Anterior Lower Leg Santyl Ointment Secondary Dressing Wound #1 Left,Anterior Lower Leg Foam Border - or large bandaid Edema Control Avoid standing for long periods of time Elevate legs to the level of the heart or above for 30 minutes daily and/or when sitting, a frequency of: - throughout the day Electronic Signature(s) Signed: 06/02/2019 6:04:26 PM By: Linton Ham  MD Signed: 06/03/2019 6:18:44 PM By: Levan Hurst RN, BSN Entered By: Levan Hurst on 06/02/2019 12:00:57 -------------------------------------------------------------------------------- Problem List Details Patient Name: Date of Service: Sarah [name] 06/02/2019 10:15 AM Medical Record [id number] Patient Account Number: [id number] Date of Birth/Sex: Treating RN: Nov 26, 1932 (86 y.o. Sumi Fetter Primary Care Provider: Patrina Levering Other Clinician: Referring Provider: Treating Provider/Extender:Rania Prothero, [name], Hal T Weeks in Treatment:  2 Active Problems ICD-10 Evaluated Encounter Code Description Active Date Today Diagnosis L97.828 Non-pressure chronic ulcer of other part of left lower 05/19/2019 No Yes leg with other specified severity I87.312 Chronic venous hypertension (idiopathic) with ulcer of 05/19/2019 No Yes left lower extremity C86.5 Angioimmunoblastic T-cell lymphoma 05/19/2019 No Yes Inactive Problems Resolved Problems Electronic Signature(s) Signed: 06/02/2019 6:04:26 PM By: Linton Ham MD Entered By: Linton Ham on 06/02/2019 13:07:24 -------------------------------------------------------------------------------- Progress Note Details Patient Name: Date of Service: Sarah [name] 06/02/2019 10:15 AM Medical Record [id number] Patient Account Number: [id number] Date of Birth/Sex: Treating RN: November 22, 1932 (86 y.o. F) Primary Care Provider: Patrina Levering Other Clinician: Referring Provider: Treating Provider/Extender:Piercen Covino, [name], Hal T Weeks in Treatment: 2 Subjective History of Present Illness (HPI) ADMISSION 05/19/2019 This is a an 86 year old woman who is referred from her oncologist Dr.Kale for review of a wound on her left anterior mid tibial area. although there are certain parts of the notes that accompany her and also in [place] link that suggests she had a wound on the right anterior  mid tibia, the patient is fairly insistent that the current wound is the only wound she is ever had. She relates that this began as a small area on her left anterior mid tibia sometime in August i.e. clearly before the diagnosis or treatment of her lymphoma began. She said that has been gradually increasing in size over time she is completely unaware of any other precipitating factors such as trauma etc. She does have what looks to be changes of chronic venous insufficiency in her legs with some degree of hemosiderin deposition and edema but has never had problems with this before. The wound itself is exceptionally painful there is no drainage she has been using Bactroban on this twice a day with the help of her daughter. She was given a course of doxycycline last week by Dr. Donzetta Matters which she is tolerating well She also in September developed a widespread rash that was pruritic. She was seen by her primary doctor and dermatologist I believe. Ultimately she had a CT scan in early October that documented widespread adenopathy with splenomegaly.Marland Kitchen She was hospitalized from 03/06/2019 through 03/08/2019 presenting with shortness of breath leg swelling on the left. She was diagnosed with a pulmonary embolism with a DVT in the left leg this predominantly involve the left posterior tibial veins and the left gastrocnemius veins. She is on Eliquis since then. The patient was ultimately diagnosed with angioimmunoblastic T-cell lymphoma and she has been on chemotherapy including Adriamycin Cytoxan and brentuximab. She was receiving a course of this every 3 weeks. She stated that the first round of chemotherapy caused her rash to go away. 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 So far she is tolerating her chemotherapy well although this will stretch out into the beginning of March 2021 The patient's major complaint is the pain she has burning pain even at night that sometimes  awakens her from sleep. Past medical history; lymphoma as noted above, breast CA with a lumpectomy in 2013, right total knee replacement, hypothyroidism, pulmonary embolism as described, DVT in the left leg also as described. ABI in our clinic was 1.26 on the left 1/11; patient arrives with the surface of this wound completely covered and adherent eschar. Her daughter was able to show me pictures on her cell phone that suggest that this was getting better however it certainly does not look like that today. She will not let me even  get close to this for debridement. Even washing the surface of the wound off seems to be on her. Objective Constitutional Sitting or standing Blood Pressure is within target range for patient.. Pulse regular and within target range for patient.Marland Kitchen Respirations regular, non-labored and within target range.. Temperature is normal and within the target range for the patient.Marland Kitchen Appears in no distress. Vitals Time Taken: 11:02 AM, Height: 64 in, Source: Stated, Weight: 111 lbs, Source: Stated, BMI: 19.1, Temperature: 97.7 F, Pulse: 80 bpm, Respiratory Rate: 18 breaths/min, Blood Pressure: 126/66 mmHg. Eyes Conjunctivae clear. No discharge.no icterus. Cardiovascular Pedal pulses are palpable on the left. Not much in the way of edema is seen. General Notes: Wound exam; the patient has a quarter sized punched out area on the left anterior mid tibia. Again tightly covered with adherent debris. No erythema around the wound. She will not even let me get near this to washout the wound surface let alone debride Integumentary (Hair, Skin) No erythema around the wound. Wound #1 status is Open. Original cause of wound was Gradually Appeared. The wound is located on the Left,Anterior Lower Leg. The wound measures 2cm length x 1.7cm width x 0.2cm depth; 2.67cm^2 area and 0.534cm^3 volume. There is Fat Layer (Subcutaneous Tissue) Exposed exposed. There is no tunneling  or undermining noted. There is a medium amount of purulent drainage noted. The wound margin is flat and intact. There is small (1-33%) pink granulation within the wound bed. There is a large (67-100%) amount of necrotic tissue within the wound bed including Adherent Slough. Assessment Active Problems ICD-10 Non-pressure chronic ulcer of other part of left lower leg with other specified severity Chronic venous hypertension (idiopathic) with ulcer of left lower extremity Angioimmunoblastic T-cell lymphoma Procedures Wound #1 Pre-procedure diagnosis of Wound #1 is a Venous Leg Ulcer located on the Left,Anterior Lower Leg .Severity of Tissue Pre Debridement is: Fat layer exposed. There was a Chemical/Enzymatic/Mechanical debridement performed by Levan Hurst, RN.Marland Kitchen Agent used was [employer]. There was no bleeding. The procedure was tolerated well with a pain level of 0 throughout and a pain level of 0 following the procedure. Post Debridement Measurements: 2cm length x 1.7cm width x 0.2cm depth; 0.534cm^3 volume. Character of Wound/Ulcer Post Debridement requires further debridement. Severity of Tissue Post Debridement is: Fat layer exposed. Post procedure Diagnosis Wound #1: Same as Pre-Procedure Plan Follow-up Appointments: Return Appointment in 2 weeks. Dressing Change Frequency: Wound #1 Left,Anterior Lower Leg: Change dressing every day. Wound Cleansing: Wound #1 Left,Anterior Lower Leg: May shower and wash wound with soap and water. Primary Wound Dressing: Wound #1 Left,Anterior Lower Leg: Santyl Ointment Secondary Dressing: Wound #1 Left,Anterior Lower Leg: Foam Border - or large bandaid Edema Control: Avoid standing for long periods of time Elevate legs to the level of the heart or above for 30 minutes daily and/or when sitting, a frequency of: - throughout the day 1. The patient's daughter showed me pictures of this that looks like the wound was improving but it certainly  does not look like that today. 2. The surface of the wound will not wash off with Anasept and gauze. Really needs mechanical debridement but the patient would hear none of it. 3. Continue with Santyl for the time being Electronic Signature(s) Signed: 06/02/2019 6:04:26 PM By: Linton Ham MD Entered By: Linton Ham on 06/02/2019 13:13:34 -------------------------------------------------------------------------------- SuperBill Details Patient Name: Date of Service: Sarah [name] 06/02/2019 Medical Record [id number] Patient Account Number: [id number] Date of Birth/Sex: Treating RN: August 08, 1932 (86 y.o. F) Primary  Care Provider: Lajean Manes T Other Clinician: Referring Provider: Treating Provider/Extender:Zakiyah Diop, [name], Hal T Weeks in Treatment: 2 Diagnosis Coding ICD-10 Codes Code Description [phone] Non-pressure chronic ulcer of other part of left lower leg with other specified severity I87.312 Chronic venous hypertension (idiopathic) with ulcer of left lower extremity C86.5 Angioimmunoblastic T-cell [place] Procedures CPT4 Code: [id number] Description: [phone] - DEBRIDE W/O ANES NON SELECT Modifier: Quantity: 1 Physician Procedures CPT4 Code Description: [id number] - WC PHYS LEVEL 2 - EST PT ICD-10 Diagnosis Description L97.828 Non-pressure chronic ulcer of other part of left lower leg w severity Modifier: ith other specifie Quantity: 1 d Electronic Signature(s) Signed: 06/02/2019 6:04:26 PM By: Linton Ham MD Entered By: Linton Ham on 06/02/2019 13:14:09

## 2019-06-03 NOTE — Progress Notes (Signed)
Sarah [name], Sarah [name] ([id number]) Visit Report for 06/02/2019 Arrival Information Details Patient Name: Date of Service: Sarah [name], Sarah [name] 06/02/2019 10:15 AM Medical Record [id number] Patient Account Number: [id number] Date of Birth/Sex: Treating RN: 1933/02/22 (86 y.o. Elam Dutch Primary Care Donelle Baba: Lajean Manes T Other Clinician: Referring Lesette Frary: Treating Cole Eastridge/Extender:Robson, [name], Hal T Weeks in Treatment: 2 Visit Information History Since Last Visit Added or deleted any medications: No Patient Arrived: Ambulatory Any new allergies or adverse reactions: No Arrival Time: 10:56 Had a fall or experienced change in No Accompanied By: self activities of daily living that may affect Transfer Assistance: None risk of falls: Patient Identification Verified: Yes Signs or symptoms of abuse/neglect since last No Secondary Verification Process Completed: Yes visito Patient Requires Transmission-Based No Hospitalized since last visit: No Precautions: Implantable device outside of the clinic excluding No Patient Has Alerts: No cellular tissue based products placed in the center since last visit: Has Dressing in Place as Prescribed: Yes Pain Present Now: Yes Electronic Signature(s) Signed: 06/03/2019 6:12:59 PM By: Baruch Gouty RN, BSN Entered By: Baruch Gouty on 06/02/2019 11:02:09 -------------------------------------------------------------------------------- Encounter Discharge Information Details Patient Name: Date of Service: Sarah [name] 06/02/2019 10:15 AM Medical Record [id number] Patient Account Number: [id number] Date of Birth/Sex: Treating RN: 1932/10/02 (86 y.o. Elam Dutch Primary Care Marcheta Horsey: Lajean Manes T Other Clinician: Referring Mayling Aber: Treating Cindy Brindisi/Extender:Robson, [name], Hal T Weeks in Treatment: 2 Encounter Discharge Information Items Post Procedure Vitals Discharge  Condition: Stable Temperature (F): 97.7 Ambulatory Status: Ambulatory Pulse (bpm): 80 Discharge Destination: Home Respiratory Rate (breaths/min): 18 Transportation: Private Auto Blood Pressure (mmHg): 126/66 Accompanied By: daughter Schedule Follow-up Appointment: Yes Clinical Summary of Care: Patient Declined Electronic Signature(s) Signed: 06/03/2019 6:12:59 PM By: Baruch Gouty RN, BSN Entered By: Baruch Gouty on 06/02/2019 12:15:32 -------------------------------------------------------------------------------- Lower Extremity Assessment Details Patient Name: Date of Service: Sarah [name], Sarah [name] 06/02/2019 10:15 AM Medical Record [id number] Patient Account Number: [id number] Date of Birth/Sex: Treating RN: Dec 28, 1932 (86 y.o. Elam Dutch Primary Care Timmya Blazier: Lajean Manes T Other Clinician: Referring Darlisha Kelm: Treating Hawkin Charo/Extender:Robson, [name], Hal T Weeks in Treatment: 2 Edema Assessment Assessed: [Left: No] [Right: No] E[Left: dema] [Right: :] Calf Left: Right: Point of Measurement: 36 cm From Medial Instep 29.3 cm cm Ankle Left: Right: Point of Measurement: 9 cm From Medial Instep 22.5 cm cm Vascular Assessment Pulses: Dorsalis Pedis Palpable: [Left:Yes] Electronic Signature(s) Signed: 06/03/2019 6:12:59 PM By: Baruch Gouty RN, BSN Entered By: Baruch Gouty on 06/02/2019 11:05:39 -------------------------------------------------------------------------------- Multi Wound Chart Details Patient Name: Date of Service: Sarah [name] 06/02/2019 10:15 AM Medical Record [id number] Patient Account Number: [id number] Date of Birth/Sex: Treating RN: 09-17-1932 (86 y.o. F) Primary Care Latrell Potempa: Lajean Manes T Other Clinician: Referring Oswin Johal: Treating Sarika Baldini/Extender:Robson, [name], Hal T Weeks in Treatment: 2 Vital Signs Height(in): 64 Pulse(bpm): 80 Weight(lbs): 111 Blood Pressure(mmHg):  126/66 Body Mass Index(BMI): 19 Temperature(F): 97.7 Respiratory 18 Rate(breaths/min): Photos: [1:No Photos] [N/A:N/A] Wound Location: [1:Left Lower Leg - Anterior N/A] Wounding Event: [1:Gradually Appeared] [N/A:N/A] Primary Etiology: [1:Venous Leg Ulcer] [N/A:N/A] Comorbid History: [1:Hypertension, Received Chemotherapy, Received Radiation] [N/A:N/A] Date Acquired: [1:12/21/2018] [N/A:N/A] Weeks of Treatment: [1:2] [N/A:N/A] Wound Status: [1:Open] [N/A:N/A] Measurements L x W x D 2x1.7x0.2 [N/A:N/A] (cm) Area (cm) : [1:2.67] [N/A:N/A] Volume (cm) : [1:0.534] [N/A:N/A] % Reduction in Area: [1:5.60%] [N/A:N/A] % Reduction in Volume: 37.00% [N/A:N/A] Classification: [1:Full Thickness Without Exposed Support Structures] [N/A:N/A] Exudate Amount: [1:Medium] [N/A:N/A] Exudate Type: [1:Purulent] [N/A:N/A] Exudate Color: [1:yellow, brown, green] [N/A:N/A] Wound Margin: [1:Flat and  Intact] [N/A:N/A] Granulation Amount: [1:Small (1-33%)] [N/A:N/A] Granulation Quality: [1:Pink] [N/A:N/A] Necrotic Amount: [1:Large (67-100%)] [N/A:N/A] Exposed Structures: [1:Fat Layer (Subcutaneous N/A Tissue) Exposed: Yes Fascia: No Tendon: No Muscle: No Joint: No Bone: No] Epithelialization: [1:Small (1-33%)] [N/A:N/A] Debridement: [1:Chemical/Enzymatic/Mechanical] [N/A:N/A] Instrument: [1:N/A] [N/A:N/A] Bleeding: [1:None] [N/A:N/A] Procedural Pain: [1:0] [N/A:N/A] Post Procedural Pain: 0 [N/A:N/A] Debridement Treatment Procedure was tolerated [N/A:N/A] Response: [1:well] Post Debridement [1:2x1.7x0.2] [N/A:N/A] Measurements L x W x D (cm) Post Debridement [1:0.534] [N/A:N/A] Volume: (cm) Procedures Performed: Debridement [N/A:N/A] Treatment Notes Wound #1 (Left, Anterior Lower Leg) [1:2. Periwound Care Barrier cream 3. Primary Dressing Applied Santyl 4. Secondary Dressing Foam Border Dressing] Electronic Signature(s) Signed: 06/02/2019 6:04:26 PM By: Linton Ham MD Entered By: Linton Ham on 06/02/2019 13:07:31 -------------------------------------------------------------------------------- Multi-Disciplinary Care Plan Details Patient Name: Date of Service: Sarah [name], Sarah [name] 06/02/2019 10:15 AM Medical Record [id number] Patient Account Number: [id number] Date of Birth/Sex: Treating RN: Jan 18, 1933 (86 y.o. Geonna Fetter Primary Care Jayel Scaduto: Patrina Levering Other Clinician: Referring Najee Cowens: Treating Taralynn Quiett/Extender:Robson, [name], Hal T Weeks in Treatment: 2 Active Inactive Venous Leg Ulcer Nursing Diagnoses: Knowledge deficit related to disease process and management Potential for venous Insuffiency (use before diagnosis confirmed) Goals: Patient will maintain optimal edema control Date Initiated: 05/19/2019 Target Resolution Date: 06/20/2019 Goal Status: Active Patient/caregiver will verbalize understanding of disease process and disease management Date Initiated: 05/19/2019 Target Resolution Date: 06/20/2019 Goal Status: Active Interventions: Assess peripheral edema status every visit. Provide education on venous insufficiency Notes: Wound/Skin Impairment Nursing Diagnoses: Impaired tissue integrity Knowledge deficit related to ulceration/compromised skin integrity Goals: Patient/caregiver will verbalize understanding of skin care regimen Date Initiated: 05/19/2019 Target Resolution Date: 06/20/2019 Goal Status: Active Ulcer/skin breakdown will have a volume reduction of 30% by week 4 Date Initiated: 05/19/2019 Target Resolution Date: 06/20/2019 Goal Status: Active Interventions: Assess patient/caregiver ability to obtain necessary supplies Assess patient/caregiver ability to perform ulcer/skin care regimen upon admission and as needed Assess ulceration(s) every visit Provide education on ulcer and skin care Notes: Electronic Signature(s) Signed: 06/03/2019 6:18:44 PM By: Levan Hurst RN, BSN Entered By: Levan Hurst on 06/02/2019 12:02:12 -------------------------------------------------------------------------------- Pain Assessment Details Patient Name: Date of Service: Sarah [name] 06/02/2019 10:15 AM Medical Record [id number] Patient Account Number: [id number] Date of Birth/Sex: Treating RN: 1932/06/10 (86 y.o. Elam Dutch Primary Care Takiya Belmares: Lajean Manes T Other Clinician: Referring Jeweline Reif: Treating Consuelo Suthers/Extender:Robson, [name], Hal T Weeks in Treatment: 2 Active Problems Location of Pain Severity and Description of Pain Patient Has Paino Yes Site Locations Pain Location: Pain in Ulcers With Dressing Change: Yes Duration of the Pain. Constant / Intermittento Constant Rate the pain. Current Pain Level: 5 Worst Pain Level: 10 Least Pain Level: 0 Character of Pain Describe the Pain: Aching, Burning Pain Management and Medication Current Pain Management: Medication: Yes Is the Current Pain Management Adequate: Adequate How does your wound impact your activities of daily livingo Sleep: Yes Bathing: No Appetite: No Relationship With Others: No Bladder Continence: No Emotions: Yes Bowel Continence: No Drive: No Toileting: No Hobbies: No Dressing: No Electronic Signature(s) Signed: 06/03/2019 6:12:59 PM By: Baruch Gouty RN, BSN Entered By: Baruch Gouty on 06/02/2019 11:03:57 -------------------------------------------------------------------------------- Patient/Caregiver Education Details Patient Name: Date of Service: Sarah [name] 1/11/2021andnbsp10:15 AM Medical Record [id number] Patient Account Number: [id number] Date of Birth/Gender: Treating RN: Nov 27, 1932 (86 y.o. Davin Fetter Primary Care Physician: Patrina Levering Other Clinician: Referring Physician: Treating Physician/Extender:Robson, [name], Junius Creamer in Treatment: 2 Education Assessment Education Provided  To: Patient  Education Topics Provided Wound/Skin Impairment: Methods: Explain/Verbal Responses: State content correctly Electronic Signature(s) Signed: 06/03/2019 6:18:44 PM By: Levan Hurst RN, BSN Entered By: Levan Hurst on 06/02/2019 12:02:23 -------------------------------------------------------------------------------- Wound Assessment Details Patient Name: Date of Service: Sarah [name] 06/02/2019 10:15 AM Medical Record [id number] Patient Account Number: [id number] Date of Birth/Sex: Treating RN: 19-Apr-1933 (86 y.o. Elam Dutch Primary Care Nesreen Albano: Other Clinician: Lajean Manes T Referring Mical Kicklighter: Treating Jamesrobert Ohanesian/Extender:Robson, [name], Hal T Weeks in Treatment: 2 Wound Status Wound Number: 1 Primary Venous Leg Ulcer Etiology: Wound Location: Left Lower Leg - Anterior Wound Open Wounding Event: Gradually Appeared Status: Date Acquired: 12/21/2018 Comorbid Hypertension, Received Chemotherapy, Weeks Of Treatment: 2 History: Received Radiation Clustered Wound: No Photos Wound Measurements Length: (cm) 2 % Reduc Width: (cm) 1.7 % Reduc Depth: (cm) 0.2 Epithel Area: (cm) 2.67 Tunnel Volume: (cm) 0.534 Underm Wound Description Classification: Full Thickness Without Exposed Support Foul Od Structures Slough/ Wound Flat and Intact Margin: Exudate Medium Amount: Exudate Purulent Type: Exudate yellow, brown, green Color: Wound Bed Granulation Amount: Small (1-33%) Granulation Quality: Pink Fascia Necrotic Amount: Large (67-100%) Fat Lay Necrotic Quality: Adherent Slough Tendon Muscle Joint E Bone Ex or After Cleansing: No Fibrino Yes Exposed Structure Exposed: No er (Subcutaneous Tissue) Exposed: Yes Exposed: No Exposed: No xposed: No posed: No tion in Area: 5.6% tion in Volume: 37% ialization: Small (1-33%) ing: No ining: No Treatment Notes Wound #1 (Left, Anterior Lower Leg) 2. Periwound  Care Barrier cream 3. Primary Dressing Applied Santyl 4. Secondary Dressing Foam Border Dressing Electronic Signature(s) Signed: 06/03/2019 3:22:35 PM By: Mikeal Hawthorne EMT/HBOT Signed: 06/03/2019 6:12:59 PM By: Baruch Gouty RN, BSN Entered By: Mikeal Hawthorne on 06/03/2019 14:25:17 -------------------------------------------------------------------------------- Vitals Details Patient Name: Date of Service: Sarah [name] 06/02/2019 10:15 AM Medical Record [id number] Patient Account Number: [id number] Date of Birth/Sex: Treating RN: 10/14/1932 (86 y.o. Elam Dutch Primary Care Aarian Cleaver: Lajean Manes T Other Clinician: Referring Roshad Hack: Treating Alessander Sikorski/Extender:Robson, [name], Hal T Weeks in Treatment: 2 Vital Signs Time Taken: 11:02 Temperature (F): 97.7 Height (in): 64 Pulse (bpm): 80 Source: Stated Respiratory Rate (breaths/min): 18 Weight (lbs): 111 Blood Pressure (mmHg): 126/66 Source: Stated Reference Range: 80 - 120 mg / dl Body Mass Index (BMI): 19.1 Electronic Signature(s) Signed: 06/03/2019 6:12:59 PM By: Baruch Gouty RN, BSN Entered By: Baruch Gouty on 06/02/2019 11:02:45

## 2019-06-04 ENCOUNTER — Encounter: Payer: Self-pay | Admitting: Hematology

## 2019-06-04 ENCOUNTER — Telehealth: Payer: Self-pay | Admitting: Hematology

## 2019-06-04 ENCOUNTER — Inpatient Hospital Stay (HOSPITAL_BASED_OUTPATIENT_CLINIC_OR_DEPARTMENT_OTHER): Payer: Medicare Other | Admitting: Hematology

## 2019-06-04 ENCOUNTER — Other Ambulatory Visit: Payer: Self-pay

## 2019-06-04 ENCOUNTER — Inpatient Hospital Stay: Payer: Medicare Other

## 2019-06-04 ENCOUNTER — Inpatient Hospital Stay: Payer: Medicare Other | Attending: Hematology

## 2019-06-04 VITALS — BP 134/72 | HR 86 | Temp 97.8°F | Resp 18 | Wt 112.0 lb

## 2019-06-04 DIAGNOSIS — I2699 Other pulmonary embolism without acute cor pulmonale: Secondary | ICD-10-CM | POA: Insufficient documentation

## 2019-06-04 DIAGNOSIS — Z5112 Encounter for antineoplastic immunotherapy: Secondary | ICD-10-CM | POA: Insufficient documentation

## 2019-06-04 DIAGNOSIS — C8440 Peripheral T-cell lymphoma, not classified, unspecified site: Secondary | ICD-10-CM

## 2019-06-04 DIAGNOSIS — I83029 Varicose veins of left lower extremity with ulcer of unspecified site: Secondary | ICD-10-CM | POA: Insufficient documentation

## 2019-06-04 DIAGNOSIS — L97929 Non-pressure chronic ulcer of unspecified part of left lower leg with unspecified severity: Secondary | ICD-10-CM | POA: Insufficient documentation

## 2019-06-04 DIAGNOSIS — Z7189 Other specified counseling: Secondary | ICD-10-CM

## 2019-06-04 DIAGNOSIS — Z853 Personal history of malignant neoplasm of breast: Secondary | ICD-10-CM | POA: Diagnosis not present

## 2019-06-04 DIAGNOSIS — C865 Angioimmunoblastic T-cell lymphoma: Secondary | ICD-10-CM | POA: Diagnosis not present

## 2019-06-04 DIAGNOSIS — Z5111 Encounter for antineoplastic chemotherapy: Secondary | ICD-10-CM

## 2019-06-04 DIAGNOSIS — Z5189 Encounter for other specified aftercare: Secondary | ICD-10-CM | POA: Diagnosis not present

## 2019-06-04 DIAGNOSIS — Z95828 Presence of other vascular implants and grafts: Secondary | ICD-10-CM | POA: Insufficient documentation

## 2019-06-04 LAB — CMP (CANCER CENTER ONLY)
ALT: 8 U/L (ref 0–44)
AST: 14 U/L — ABNORMAL LOW (ref 15–41)
Albumin: 3.5 g/dL (ref 3.5–5.0)
Alkaline Phosphatase: 85 U/L (ref 38–126)
Anion gap: 10 (ref 5–15)
BUN: 26 mg/dL — ABNORMAL HIGH (ref 8–23)
CO2: 27 mmol/L (ref 22–32)
Calcium: 9.4 mg/dL (ref 8.9–10.3)
Chloride: 106 mmol/L (ref 98–111)
Creatinine: 0.71 mg/dL (ref 0.44–1.00)
GFR, Est AFR Am: >60 mL/min (ref >60)
GFR, Estimated: >60 mL/min (ref >60)
Glucose, Bld: 104 mg/dL — ABNORMAL HIGH (ref 70–99)
Potassium: 3.8 mmol/L (ref 3.5–5.1)
Sodium: 143 mmol/L (ref 135–145)
Total Bilirubin: 0.5 mg/dL (ref 0.3–1.2)
Total Protein: 6.5 g/dL (ref 6.5–8.1)

## 2019-06-04 LAB — CBC WITH DIFFERENTIAL/PLATELET
Abs Immature Granulocytes: 0.08 10*3/uL — ABNORMAL HIGH (ref 0.00–0.07)
Basophils Absolute: 0.1 10*3/uL (ref 0.0–0.1)
Basophils Relative: 1 %
Eosinophils Absolute: 0.1 10*3/uL (ref 0.0–0.5)
Eosinophils Relative: 1 %
HCT: 37.4 % (ref 36.0–46.0)
Hemoglobin: 12.2 g/dL (ref 12.0–15.0)
Immature Granulocytes: 1 %
Lymphocytes Relative: 7 %
Lymphs Abs: 0.5 10*3/uL — ABNORMAL LOW (ref 0.7–4.0)
MCH: 28.5 pg (ref 26.0–34.0)
MCHC: 32.6 g/dL (ref 30.0–36.0)
MCV: 87.4 fL (ref 80.0–100.0)
Monocytes Absolute: 0.8 10*3/uL (ref 0.1–1.0)
Monocytes Relative: 11 %
Neutro Abs: 5.5 10*3/uL (ref 1.7–7.7)
Neutrophils Relative %: 79 %
Platelets: 248 10*3/uL (ref 150–400)
RBC: 4.28 MIL/uL (ref 3.87–5.11)
RDW: 16.7 % — ABNORMAL HIGH (ref 11.5–15.5)
WBC: 6.9 10*3/uL (ref 4.0–10.5)
nRBC: 0 % (ref 0.0–0.2)

## 2019-06-04 LAB — MAGNESIUM: Magnesium: 1.9 mg/dL (ref 1.7–2.4)

## 2019-06-04 MED ORDER — SODIUM CHLORIDE 0.9 % IV SOLN
Freq: Once | INTRAVENOUS | Status: AC
  Filled 2019-06-04: qty 250

## 2019-06-04 MED ORDER — PALONOSETRON HCL INJECTION 0.25 MG/5ML
0.2500 mg | Freq: Once | INTRAVENOUS | Status: AC
  Administered 2019-06-04: 0.25 mg via INTRAVENOUS

## 2019-06-04 MED ORDER — DIPHENHYDRAMINE HCL 50 MG/ML IJ SOLN
50.0000 mg | Freq: Once | INTRAMUSCULAR | Status: AC
  Administered 2019-06-04: 11:00:00 50 mg via INTRAVENOUS

## 2019-06-04 MED ORDER — DIPHENHYDRAMINE HCL 50 MG/ML IJ SOLN
INTRAMUSCULAR | Status: AC
  Filled 2019-06-04: qty 1

## 2019-06-04 MED ORDER — SODIUM CHLORIDE 0.9 % IV SOLN
Freq: Once | INTRAVENOUS | Status: AC
  Filled 2019-06-04: qty 5

## 2019-06-04 MED ORDER — PALONOSETRON HCL INJECTION 0.25 MG/5ML
INTRAVENOUS | Status: AC
  Filled 2019-06-04: qty 5

## 2019-06-04 MED ORDER — SODIUM CHLORIDE 0.9 % IV SOLN
1.2000 mg/kg | Freq: Once | INTRAVENOUS | Status: AC
  Administered 2019-06-04: 14:00:00 65 mg via INTRAVENOUS
  Filled 2019-06-04: qty 13

## 2019-06-04 MED ORDER — ACETAMINOPHEN 325 MG PO TABS
650.0000 mg | ORAL_TABLET | Freq: Once | ORAL | Status: AC
  Administered 2019-06-04: 11:00:00 650 mg via ORAL

## 2019-06-04 MED ORDER — SODIUM CHLORIDE 0.9 % IV SOLN
400.0000 mg/m2 | Freq: Once | INTRAVENOUS | Status: AC
  Administered 2019-06-04: 13:00:00 620 mg via INTRAVENOUS
  Filled 2019-06-04: qty 31

## 2019-06-04 MED ORDER — SODIUM CHLORIDE 0.9% FLUSH
10.0000 mL | Freq: Once | INTRAVENOUS | Status: AC
  Administered 2019-06-04: 10 mL
  Filled 2019-06-04: qty 10

## 2019-06-04 MED ORDER — DOXORUBICIN HCL CHEMO IV INJECTION 2 MG/ML
25.0000 mg/m2 | Freq: Once | INTRAVENOUS | Status: AC
  Administered 2019-06-04: 13:00:00 40 mg via INTRAVENOUS
  Filled 2019-06-04: qty 20

## 2019-06-04 MED ORDER — FAMOTIDINE IN NACL 20-0.9 MG/50ML-% IV SOLN
20.0000 mg | Freq: Once | INTRAVENOUS | Status: AC
  Administered 2019-06-04: 20 mg via INTRAVENOUS

## 2019-06-04 MED ORDER — HEPARIN SOD (PORK) LOCK FLUSH 100 UNIT/ML IV SOLN
500.0000 [IU] | Freq: Once | INTRAVENOUS | Status: DC | PRN
  Filled 2019-06-04: qty 5

## 2019-06-04 MED ORDER — SODIUM CHLORIDE 0.9% FLUSH
10.0000 mL | INTRAVENOUS | Status: DC | PRN
  Filled 2019-06-04: qty 10

## 2019-06-04 MED ORDER — FAMOTIDINE IN NACL 20-0.9 MG/50ML-% IV SOLN
INTRAVENOUS | Status: AC
  Filled 2019-06-04: qty 50

## 2019-06-04 MED ORDER — ACETAMINOPHEN 325 MG PO TABS
ORAL_TABLET | ORAL | Status: AC
  Filled 2019-06-04: qty 2

## 2019-06-04 NOTE — Telephone Encounter (Signed)
Per 1/13 los f/u appt with treatment already scheduled,

## 2019-06-04 NOTE — Patient Instructions (Signed)
[facility] Discharge Instructions for Patients Receiving Chemotherapy  Today you received the following chemotherapy agents: Adcretis/Adriamycin/Cytoxan.  To help prevent nausea and vomiting after your treatment, we encourage you to take your nausea medication as directed.   If you develop nausea and vomiting that is not controlled by your nausea medication, call the clinic.   BELOW ARE SYMPTOMS THAT SHOULD BE REPORTED IMMEDIATELY:  *FEVER GREATER THAN 100.5 F  *CHILLS WITH OR WITHOUT FEVER  NAUSEA AND VOMITING THAT IS NOT CONTROLLED WITH YOUR NAUSEA MEDICATION  *UNUSUAL SHORTNESS OF BREATH  *UNUSUAL BRUISING OR BLEEDING  TENDERNESS IN MOUTH AND THROAT WITH OR WITHOUT PRESENCE OF ULCERS  *URINARY PROBLEMS  *BOWEL PROBLEMS  UNUSUAL RASH Items with * indicate a potential emergency and should be followed up as soon as possible.  Feel free to call the clinic should you have any questions or concerns. The clinic phone number is [phone].  Please show the [facility] at check-in to the Emergency Department and triage nurse.

## 2019-06-06 ENCOUNTER — Other Ambulatory Visit: Payer: Self-pay

## 2019-06-06 ENCOUNTER — Inpatient Hospital Stay: Payer: Medicare Other

## 2019-06-06 VITALS — BP 128/72 | HR 78 | Temp 98.2°F | Resp 18

## 2019-06-06 DIAGNOSIS — Z7189 Other specified counseling: Secondary | ICD-10-CM

## 2019-06-06 DIAGNOSIS — Z5111 Encounter for antineoplastic chemotherapy: Secondary | ICD-10-CM | POA: Diagnosis not present

## 2019-06-06 DIAGNOSIS — L97929 Non-pressure chronic ulcer of unspecified part of left lower leg with unspecified severity: Secondary | ICD-10-CM | POA: Diagnosis not present

## 2019-06-06 DIAGNOSIS — C865 Angioimmunoblastic T-cell lymphoma: Secondary | ICD-10-CM | POA: Diagnosis not present

## 2019-06-06 DIAGNOSIS — I2699 Other pulmonary embolism without acute cor pulmonale: Secondary | ICD-10-CM | POA: Diagnosis not present

## 2019-06-06 DIAGNOSIS — Z5112 Encounter for antineoplastic immunotherapy: Secondary | ICD-10-CM | POA: Diagnosis not present

## 2019-06-06 DIAGNOSIS — I83029 Varicose veins of left lower extremity with ulcer of unspecified site: Secondary | ICD-10-CM | POA: Diagnosis not present

## 2019-06-06 DIAGNOSIS — C8440 Peripheral T-cell lymphoma, not classified, unspecified site: Secondary | ICD-10-CM

## 2019-06-06 MED ORDER — PEGFILGRASTIM-JMDB 6 MG/0.6ML ~~LOC~~ SOSY
6.0000 mg | PREFILLED_SYRINGE | Freq: Once | SUBCUTANEOUS | Status: AC
  Administered 2019-06-06: 6 mg via SUBCUTANEOUS

## 2019-06-06 MED ORDER — PEGFILGRASTIM-JMDB 6 MG/0.6ML ~~LOC~~ SOSY
PREFILLED_SYRINGE | SUBCUTANEOUS | Status: AC
  Filled 2019-06-06: qty 0.6

## 2019-06-06 NOTE — Patient Instructions (Signed)

## 2019-06-11 ENCOUNTER — Telehealth: Payer: Self-pay | Admitting: Critical Care Medicine

## 2019-06-11 MED ORDER — APIXABAN 5 MG PO TABS: 5.0000 mg | ORAL_TABLET | Freq: Two times a day (BID) | ORAL | 0 refills | Status: DC

## 2019-06-11 NOTE — Telephone Encounter (Signed)
FYI, Wanted to send this to both of you so that you are aware that is Dr. Ainsley Spinner patient but its being sent to Dr. Melvyn Novas due to pharmacy not being able to fond Dr. Ainsley Spinner name in their system. Thank you

## 2019-06-11 NOTE — Telephone Encounter (Signed)
Ok to give once with all refills per Dr Carlis Abbott

## 2019-06-11 NOTE — Telephone Encounter (Signed)
Rx was sent under Dr Melvyn Novas  Need to figure out how to get Dr Ainsley Spinner name in their system  Will hold for further investigation

## 2019-06-16 ENCOUNTER — Other Ambulatory Visit: Payer: Self-pay

## 2019-06-16 ENCOUNTER — Encounter (HOSPITAL_BASED_OUTPATIENT_CLINIC_OR_DEPARTMENT_OTHER): Payer: Medicare Other | Attending: Internal Medicine | Admitting: Internal Medicine

## 2019-06-16 DIAGNOSIS — Z86718 Personal history of other venous thrombosis and embolism: Secondary | ICD-10-CM | POA: Diagnosis not present

## 2019-06-16 DIAGNOSIS — Z86711 Personal history of pulmonary embolism: Secondary | ICD-10-CM | POA: Diagnosis not present

## 2019-06-16 DIAGNOSIS — I872 Venous insufficiency (chronic) (peripheral): Secondary | ICD-10-CM | POA: Diagnosis not present

## 2019-06-16 DIAGNOSIS — C865 Angioimmunoblastic T-cell lymphoma: Secondary | ICD-10-CM | POA: Diagnosis not present

## 2019-06-16 DIAGNOSIS — S81802A Unspecified open wound, left lower leg, initial encounter: Secondary | ICD-10-CM | POA: Diagnosis not present

## 2019-06-16 DIAGNOSIS — L97822 Non-pressure chronic ulcer of other part of left lower leg with fat layer exposed: Secondary | ICD-10-CM | POA: Diagnosis not present

## 2019-06-16 DIAGNOSIS — J910 Malignant pleural effusion: Secondary | ICD-10-CM | POA: Diagnosis not present

## 2019-06-17 NOTE — Telephone Encounter (Signed)
Sarah [name] did we ever get any more information on this?

## 2019-06-17 NOTE — Progress Notes (Signed)
MARIAMAWIT, DEPAOLI ([id number]) Visit Report for 06/16/2019 HPI Details Patient Name: Date of Service: ALMADELIA, LOOMAN 06/16/2019 11:00 AM Medical Record [id number] Patient Account Number: [id number] Date of Birth/Sex: Treating RN: 03/16/1933 (86 y.o. F) Primary Care Provider: Patrina Levering Other Clinician: Referring Provider: Treating Provider/Extender:Shiraz Bastyr, [name], Hal T Weeks in Treatment: 4 History of Present Illness HPI Description: ADMISSION 05/19/2019 This is a an 86 year old woman who is referred from her oncologist Dr.Kale for review of a wound on her left anterior mid tibial area. although there are certain parts of the notes that accompany her and also in [place] link that suggests she had a wound on the right anterior mid tibia, the patient is fairly insistent that the current wound is the only wound she is ever had. She relates that this began as a small area on her left anterior mid tibia sometime in August i.e. clearly before the diagnosis or treatment of her lymphoma began. She said that has been gradually increasing in size over time she is completely unaware of any other precipitating factors such as trauma etc. She does have what looks to be changes of chronic venous insufficiency in her legs with some degree of hemosiderin deposition and edema but has never had problems with this before. The wound itself is exceptionally painful there is no drainage she has been using Bactroban on this twice a day with the help of her daughter. She was given a course of doxycycline last week by Dr. Donzetta Matters which she is tolerating well She also in September developed a widespread rash that was pruritic. She was seen by her primary doctor and dermatologist I believe. Ultimately she had a CT scan in early October that documented widespread adenopathy with splenomegaly.Marland Kitchen She was hospitalized from 03/06/2019 through 03/08/2019 presenting with shortness of breath leg  swelling on the left. She was diagnosed with a pulmonary embolism with a DVT in the left leg this predominantly involve the left posterior tibial veins and the left gastrocnemius veins. She is on Eliquis since then. The patient was ultimately diagnosed with angioimmunoblastic T-cell lymphoma and she has been on chemotherapy including Adriamycin Cytoxan and brentuximab. She was receiving a course of this every 3 weeks. She stated that the first round of chemotherapy caused her rash to go away. 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 So far she is tolerating her chemotherapy well although this will stretch out into the beginning of March 2021 The patient's major complaint is the pain she has burning pain even at night that sometimes awakens her from sleep. Past medical history; lymphoma as noted above, breast CA with a lumpectomy in 2013, right total knee replacement, hypothyroidism, pulmonary embolism as described, DVT in the left leg also as described. ABI in our clinic was 1.26 on the left 1/11; patient arrives with the surface of this wound completely covered and adherent eschar. Her daughter was able to show me pictures on her cell phone that suggest that this was getting better however it certainly does not look like that today. She will not let me even get close to this for debridement. Even washing the surface of the wound off seems to be on her. 1/25; 2-week follow-up. In general the wound on the left anterior tibia looks better although there is still surface debris under illumination. There is no ability to do mechanical debridement on this the patient simply cannot stand it. Even washing this off with Anasept and gauze is prohibitive  in terms of discomfort. We have been using Santyl and for this foreseeable future that we will need to continue. Electronic Signature(s) Signed: 06/17/2019 5:32:11 PM By: Linton Ham MD Entered By: Linton Ham  on 06/17/2019 07:12:25 -------------------------------------------------------------------------------- Physical Exam Details Patient Name: Date of Service: [name] 06/16/2019 11:00 AM Medical Record [id number] Patient Account Number: [id number] Date of Birth/Sex: Treating RN: 1933/02/28 (86 y.o. F) Primary Care Provider: Patrina Levering Other Clinician: Referring Provider: Treating Provider/Extender:Nilesh Stegall, [name], Hal T Weeks in Treatment: 4 Cardiovascular Pedal pulses are palpable on the left. Edema present in both extremities. Pitting edema on the left. This is 2+. Integumentary (Hair, Skin) No erythema around the wound. The patient has changes of chronic venous insufficiency. Notes Wound exam; the patient's wound volume is the same however there has been improvement in the overall presentation of the wound surface. Under illumination there is still further debridement that is going to be necessary. Again there are no options for even nonmechanical debridement at the bedside. Electronic Signature(s) Signed: 06/17/2019 5:32:11 PM By: Linton Ham MD Entered By: Linton Ham on 06/17/2019 81:82:99 -------------------------------------------------------------------------------- Physician Orders Details Patient Name: Date of Service: [name] 06/16/2019 11:00 AM Medical Record [id number] Patient Account Number: [id number] Date of Birth/Sex: Treating RN: [birth year] (86 y.o. Faduma Fetter Primary Care Provider: Lajean Manes T Other Clinician: Referring Provider: Treating Provider/Extender:Hershal Eriksson, [name], Hal T Weeks in Treatment: 4 Verbal / Phone Orders: No Diagnosis Coding ICD-10 Coding Code Description [phone] Non-pressure chronic ulcer of other part of left lower leg with other specified severity I87.312 Chronic venous hypertension (idiopathic) with ulcer of left lower extremity C86.5 Angioimmunoblastic T-cell  lymphoma Follow-up Appointments Return Appointment in 2 weeks. Dressing Change Frequency Wound #1 Left,Anterior Lower Leg Change dressing every day. Wound Cleansing Wound #1 Left,Anterior Lower Leg May shower and wash wound with soap and water. Primary Wound Dressing Wound #1 Left,Anterior Lower Leg Santyl Ointment Secondary Dressing Wound #1 Left,Anterior Lower Leg Foam Border - or large bandaid Edema Control Patient to wear own compression stockings - both legs daily Avoid standing for long periods of time Elevate legs to the level of the heart or above for 30 minutes daily and/or when sitting, a frequency of: - throughout the day Electronic Signature(s) Signed: 06/16/2019 6:46:58 PM By: Levan Hurst RN, BSN Signed: 06/17/2019 5:32:11 PM By: Linton Ham MD Entered By: Levan Hurst on 06/16/2019 12:17:46 -------------------------------------------------------------------------------- Problem List Details Patient Name: Date of Service: [name] 06/16/2019 11:00 AM Medical Record [id number] Patient Account Number: [id number] Date of Birth/Sex: Treating RN: [birth year] (86 y.o. Tayja Fetter Primary Care Provider: Patrina Levering Other Clinician: Referring Provider: Treating Provider/Extender:Shawna Wearing, [name], Junius Creamer in Treatment: 4 Active Problems ICD-10 Evaluated Encounter Code Description Active Date Today Diagnosis L97.828 Non-pressure chronic ulcer of other part of left lower 05/19/2019 No Yes leg with other specified severity I87.312 Chronic venous hypertension (idiopathic) with ulcer of 05/19/2019 No Yes left lower extremity C86.5 Angioimmunoblastic T-cell lymphoma 05/19/2019 No Yes Inactive Problems Resolved Problems Electronic Signature(s) Signed: 06/17/2019 5:32:11 PM By: Linton Ham MD Previous Signature: 06/16/2019 6:46:58 PM Version By: Levan Hurst RN, BSN Entered By: Linton Ham on 06/17/2019  07:11:22 -------------------------------------------------------------------------------- Progress Note Details Patient Name: Date of Service: [name] 06/16/2019 11:00 AM Medical Record [id number] Patient Account Number: [id number] Date of Birth/Sex: Treating RN: [birth year] (86 y.o. F) Primary Care Provider: Patrina Levering Other Clinician: Referring Provider: Treating Provider/Extender:Kayte Borchard, [name], Hal T Weeks in Treatment: 4  Subjective History of Present Illness (HPI) ADMISSION 05/19/2019 This is a an 86 year old woman who is referred from her oncologist Dr.Kale for review of a wound on her left anterior mid tibial area. although there are certain parts of the notes that accompany her and also in [place] link that suggests she had a wound on the right anterior mid tibia, the patient is fairly insistent that the current wound is the only wound she is ever had. She relates that this began as a small area on her left anterior mid tibia sometime in August i.e. clearly before the diagnosis or treatment of her lymphoma began. She said that has been gradually increasing in size over time she is completely unaware of any other precipitating factors such as trauma etc. She does have what looks to be changes of chronic venous insufficiency in her legs with some degree of hemosiderin deposition and edema but has never had problems with this before. The wound itself is exceptionally painful there is no drainage she has been using Bactroban on this twice a day with the help of her daughter. She was given a course of doxycycline last week by Dr. Donzetta Matters which she is tolerating well She also in September developed a widespread rash that was pruritic. She was seen by her primary doctor and dermatologist I believe. Ultimately she had a CT scan in early October that documented widespread adenopathy with splenomegaly.Marland Kitchen She was hospitalized from 03/06/2019 through  03/08/2019 presenting with shortness of breath leg swelling on the left. She was diagnosed with a pulmonary embolism with a DVT in the left leg this predominantly involve the left posterior tibial veins and the left gastrocnemius veins. She is on Eliquis since then. The patient was ultimately diagnosed with angioimmunoblastic T-cell lymphoma and she has been on chemotherapy including Adriamycin Cytoxan and brentuximab. She was receiving a course of this every 3 weeks. She stated that the first round of chemotherapy caused her rash to go away. 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 So far she is tolerating her chemotherapy well although this will stretch out into the beginning of March 2021 The patient's major complaint is the pain she has burning pain even at night that sometimes awakens her from sleep. Past medical history; lymphoma as noted above, breast CA with a lumpectomy in 2013, right total knee replacement, hypothyroidism, pulmonary embolism as described, DVT in the left leg also as described. ABI in our clinic was 1.26 on the left 1/11; patient arrives with the surface of this wound completely covered and adherent eschar. Her daughter was able to show me pictures on her cell phone that suggest that this was getting better however it certainly does not look like that today. She will not let me even get close to this for debridement. Even washing the surface of the wound off seems to be on her. 1/25; 2-week follow-up. In general the wound on the left anterior tibia looks better although there is still surface debris under illumination. There is no ability to do mechanical debridement on this the patient simply cannot stand it. Even washing this off with Anasept and gauze is prohibitive in terms of discomfort. We have been using Santyl and for this foreseeable future that we will need to continue. Objective Constitutional Vitals Time Taken: 12:10 PM,  Height: 64 in, Weight: 111 lbs, BMI: 19.1, Temperature: 98.1 F, Pulse: 85 bpm, Respiratory Rate: 18 breaths/min, Blood Pressure: 135/71 mmHg. Cardiovascular Pedal pulses are palpable on the  left. Edema present in both extremities. Pitting edema on the left. This is 2+. General Notes: Wound exam; the patient's wound volume is the same however there has been improvement in the overall presentation of the wound surface. Under illumination there is still further debridement that is going to be necessary. Again there are no options for even nonmechanical debridement at the bedside. Integumentary (Hair, Skin) No erythema around the wound. The patient has changes of chronic venous insufficiency. Wound #1 status is Open. Original cause of wound was Gradually Appeared. The wound is located on the Left,Anterior Lower Leg. The wound measures 2.1cm length x 1.8cm width x 0.2cm depth; 2.969cm^2 area and 0.594cm^3 volume. There is Fat Layer (Subcutaneous Tissue) Exposed exposed. There is no tunneling or undermining noted. There is a medium amount of serosanguineous drainage noted. The wound margin is flat and intact. There is medium (34-66%) pink granulation within the wound bed. There is a medium (34-66%) amount of necrotic tissue within the wound bed including Adherent Slough. Assessment Active Problems ICD-10 Non-pressure chronic ulcer of other part of left lower leg with other specified severity Chronic venous hypertension (idiopathic) with ulcer of left lower extremity Angioimmunoblastic T-cell lymphoma Plan Follow-up Appointments: Return Appointment in 2 weeks. Dressing Change Frequency: Wound #1 Left,Anterior Lower Leg: Change dressing every day. Wound Cleansing: Wound #1 Left,Anterior Lower Leg: May shower and wash wound with soap and water. Primary Wound Dressing: Wound #1 Left,Anterior Lower Leg: Santyl Ointment Secondary Dressing: Wound #1 Left,Anterior Lower Leg: Foam Border - or  large bandaid Edema Control: Patient to wear own compression stockings - both legs daily Avoid standing for long periods of time Elevate legs to the level of the heart or above for 30 minutes daily and/or when sitting, a frequency of: - throughout the day 1. For now I am continuing with Santyl border foam 2. Because of the presence of pitting edema which in itself is probably related to chronic venous insufficiency, I have asked her to use her compression stockings to control the swelling. 3. I believe I have the option to dress this with something like Iodoflex which might help with debridement and put her in 3 layer compression and that certainly an option to consider if we can control the edema with her stocking. She has not been wearing this 4. She has had a recent DVT in the left leg although I see no evidence to suspect there is an active clot. This could be postphlebitic syndrome. Electronic Signature(s) Signed: 06/17/2019 5:32:11 PM By: Linton Ham MD Entered By: Linton Ham on 06/17/2019 07:18:39 -------------------------------------------------------------------------------- SuperBill Details Patient Name: Date of Service: [name] 06/16/2019 Medical Record [id number] Patient Account Number: [id number] Date of Birth/Sex: Treating RN: 1932-12-03 (86 y.o. Jaidy Fetter Primary Care Provider: Lajean Manes T Other Clinician: Referring Provider: Treating Provider/Extender:Bowdy Bair, [name], Hal T Weeks in Treatment: 4 Diagnosis Coding ICD-10 Codes Code Description [phone] Non-pressure chronic ulcer of other part of left lower leg with other specified severity I87.312 Chronic venous hypertension (idiopathic) with ulcer of left lower extremity C86.5 Angioimmunoblastic T-cell lymphoma Facility Procedures CPT4 Code: [id number] Description: 99213 - WOUND CARE VISIT-LEV 3 EST PT Modifier: Quantity: 1 Physician Procedures CPT4 Code Description:  [id number] 99213 - WC PHYS LEVEL 3 - EST PT ICD-10 Diagnosis Description L97.828 Non-pressure chronic ulcer of other part of left lower leg w severity I87.312 Chronic venous hypertension (idiopathic) with ulcer of left C86.5  Angioimmunoblastic T-cell lymphoma Modifier: ith other specifie lower extremity Quantity: 1 d Electronic  Signature(s) Signed: 06/17/2019 5:32:11 PM By: Linton Ham MD Previous Signature: 06/16/2019 6:46:58 PM Version By: Levan Hurst RN, BSN Entered By: Linton Ham on 06/17/2019 07:18:56

## 2019-06-18 NOTE — Telephone Encounter (Signed)
This is being worked on. Will update as appropriate.

## 2019-06-19 NOTE — Progress Notes (Signed)
ICEY, TELLO ([id number]) Visit Report for 06/16/2019 Arrival Information Details Patient Name: Date of Service: Sarah [name], Sarah [name] 06/16/2019 11:00 AM Medical Record [id number] Patient Account Number: [id number] Date of Birth/Sex: Treating RN: March 03, 1933 (86 y.o. [name] Primary Care Chaunta Bejarano: Lajean Manes T Other Clinician: Referring Guillermo Nehring: Treating Lorie Melichar/Extender:Robson, [name], Hal T Weeks in Treatment: 4 Visit Information History Since Last Visit Added or deleted any medications: No Patient Arrived: Ambulatory Any new allergies or adverse reactions: No Arrival Time: 12:07 Had a fall or experienced change in No Accompanied By: self activities of daily living that may affect Transfer Assistance: None risk of falls: Patient Identification Verified: Yes Signs or symptoms of abuse/neglect since last No Secondary Verification Process Completed: Yes visito Patient Requires Transmission-Based No Hospitalized since last visit: No Precautions: Implantable device outside of the clinic excluding No Patient Has Alerts: No cellular tissue based products placed in the center since last visit: Pain Present Now: No Electronic Signature(s) Signed: 06/19/2019 7:44:20 AM By: Kela Millin Entered By: Kela Millin on 06/16/2019 12:14:01 -------------------------------------------------------------------------------- Clinic Level of Care Assessment Details Patient Name: Date of Service: Sarah [name], Sarah [name] 06/16/2019 11:00 AM Medical Record [id number] Patient Account Number: [id number] Date of Birth/Sex: Treating RN: 1933/05/19 (86 y.o. Aaleigha Fetter Primary Care Jeovany Huitron: Lajean Manes T Other Clinician: Referring Marsia Cino: Treating Jassica Zazueta/Extender:Robson, [name], Hal T Weeks in Treatment: 4 Clinic Level of Care Assessment Items TOOL 4 Quantity Score X - Use when only an EandM is performed on FOLLOW-UP visit 1  0 ASSESSMENTS - Nursing Assessment / Reassessment X - Reassessment of Co-morbidities (includes updates in patient status) 1 10 X - Reassessment of Adherence to Treatment Plan 1 5 ASSESSMENTS - Wound and Skin Assessment / Reassessment X - Simple Wound Assessment / Reassessment - one wound 1 5 _0  - Complex Wound Assessment / Reassessment - multiple wounds 0 _1  - Dermatologic / Skin Assessment (not related to wound area) 0 ASSESSMENTS - Focused Assessment _2  - Circumferential Edema Measurements - multi extremities 0 _3  - Nutritional Assessment / Counseling / Intervention 0 X - Lower Extremity Assessment (monofilament, tuning fork, pulses) 1 5 _4  - Peripheral Arterial Disease Assessment (using hand held doppler) 0 ASSESSMENTS - Ostomy and/or Continence Assessment and Care _5  - Incontinence Assessment and Management 0 _6  - Ostomy Care Assessment and Management (repouching, etc.) 0 PROCESS - Coordination of Care X - Simple Patient / Family Education for ongoing care 1 15 _7  - Complex (extensive) Patient / Family Education for ongoing care 0 X - Staff obtains Programmer, systems, Records, Test Results / Process Orders 1 10 _8  - Staff telephones HHA, Nursing Homes / Clarify orders / etc 0 _9  - Routine Transfer to another Facility (non-emergent condition) 0 _10  - Routine Hospital Admission (non-emergent condition) 0 _11  - New Admissions / Biomedical engineer / Ordering NPWT, Apligraf, etc. 0 _12  - Emergency Hospital Admission (emergent condition) 0 X - Simple Discharge Coordination 1 10 _13  - Complex (extensive) Discharge Coordination 0 PROCESS - Special Needs _14  - Pediatric / Minor Patient Management 0 _15  - Isolation Patient Management 0 _16  - Hearing / Language / Visual special needs 0 _17  - Assessment of Community assistance (transportation, D/C planning, etc.) 0 _18  - Additional assistance / Altered mentation 0 _19  - Support Surface(s) Assessment (bed, cushion, seat, etc.) 0 INTERVENTIONS - Wound  Cleansing / Measurement X - Simple Wound Cleansing - one wound 1 5 _20  - Complex Wound Cleansing - multiple wounds 0 X - Wound Imaging (photographs - any number  of wounds) 1 5 _0  - Wound Tracing (instead of photographs) 0 X - Simple Wound Measurement - one wound 1 5 _1  - Complex Wound Measurement - multiple wounds 0 INTERVENTIONS - Wound Dressings X - Small Wound Dressing one or multiple wounds 1 10 _2  - Medium Wound Dressing one or multiple wounds 0 _3  - Large Wound Dressing one or multiple wounds 0 X - Application of Medications - topical 1 5 <[id number]>_3<\/[id number]>_4  - Application of Medications - injection 0 INTERVENTIONS - Miscellaneous _5  - External ear exam 0 _6  - Specimen Collection (cultures, biopsies, blood, body fluids, etc.) 0 _7  - Specimen(s) / Culture(s) sent or taken to Lab for analysis 0 _8  - Patient Transfer (multiple staff / Civil Service fast streamer / Similar devices) 0 _9  - Simple Staple / Suture removal (25 or less) 0 _10  - Complex Staple / Suture removal (26 or more) 0 _11  - Hypo / Hyperglycemic Management (close monitor of Blood Glucose) 0 _12  - Ankle / Brachial Index (ABI) - do not check if billed separately 0 X - Vital Signs 1 5 Has the patient been seen at the hospital within the last three years: Yes Total Score: 95 Level Of Care: New/Established - Level 3 Electronic Signature(s) Signed: 06/16/2019 6:46:58 PM By: Levan Hurst RN, BSN Entered By: Levan Hurst on 06/16/2019 18:07:26 -------------------------------------------------------------------------------- Encounter Discharge Information Details Patient Name: Date of Service: Sarah [name] 06/16/2019 11:00 AM Medical Record [id number] Patient Account Number: [id number] Date of Birth/Sex: Treating RN: 13-Jun-1932 (86 y.o. [name] Primary Care Arrion Broaddus: Patrina Levering Other Clinician: Referring Blase Beckner: Treating Leathie Weich/Extender:Robson, [name], Junius Creamer in Treatment: 4 Encounter Discharge Information  Items Discharge Condition: Stable Ambulatory Status: Ambulatory Discharge Destination: Home Transportation: Private Auto Accompanied By: family member Schedule Follow-up Appointment: Yes Clinical Summary of Care: Electronic Signature(s) Signed: 06/16/2019 5:49:19 PM By: Deon Pilling Entered By: Deon Pilling on 06/16/2019 12:30:56 -------------------------------------------------------------------------------- Lower Extremity Assessment Details Patient Name: Date of Service: Sarah [name], Sarah [name] 06/16/2019 11:00 AM Medical Record [id number] Patient Account Number: [id number] Date of Birth/Sex: Treating RN: [birth year] (86 y.o. [name] Primary Care Livie Vanderhoof: Lajean Manes T Other Clinician: Referring Mathea Frieling: Treating Ab Leaming/Extender:Robson, [name], Hal T Weeks in Treatment: 4 Edema Assessment Assessed: [Left: Yes] [Right: No] Edema: [Left: Ye] [Right: s] Calf Left: Right: Point of Measurement: 36 cm From Medial Instep 28.2 cm cm Ankle Left: Right: Point of Measurement: 9 cm From Medial Instep 22.5 cm cm Vascular Assessment Pulses: Dorsalis Pedis Palpable: [Left:Yes] Electronic Signature(s) Signed: 06/19/2019 7:44:20 AM By: Kela Millin Entered By: Kela Millin on 06/16/2019 12:16:37 -------------------------------------------------------------------------------- Multi Wound Chart Details Patient Name: Date of Service: Sarah [name] 06/16/2019 11:00 AM Medical Record [id number] Patient Account Number: [id number] Date of Birth/Sex: Treating RN: 19-Aug-1932 (86 y.o. F) Primary Care Sahvannah Rieser: Lajean Manes T Other Clinician: Referring Ivyrose Hashman: Treating Able Malloy/Extender:Robson, [name], Hal T Weeks in Treatment: 4 Vital Signs Height(in): 64 Pulse(bpm): 85 Weight(lbs): 111 Blood Pressure(mmHg): 135/71 Body Mass Index(BMI): 19 Temperature(F): 98.1 Respiratory 18 Rate(breaths/min): Photos: [1:No Photos]  [N/A:N/A] Wound Location: [1:Left Lower Leg - Anterior N/A] Wounding Event: [1:Gradually Appeared] [N/A:N/A] Primary Etiology: [1:Venous Leg Ulcer] [N/A:N/A] Comorbid History: [1:Hypertension, Received Chemotherapy, Received Radiation] [N/A:N/A] Date Acquired: [1:12/21/2018] [N/A:N/A] Weeks of Treatment: [1:4] [N/A:N/A] Wound Status: [1:Open] [N/A:N/A] Measurements L x W x D 2.1x1.8x0.2 [N/A:N/A] (cm) Area (cm) : [1:2.969] [N/A:N/A] Volume (cm) : [1:0.594] [N/A:N/A] % Reduction in Area: [1:-5.00%] [N/A:N/A] % Reduction in Volume: 30.00% [N/A:N/A] Classification: [1:Full Thickness Without Exposed Support Structures] [N/A:N/A] Exudate Amount: [  1:Medium] [N/A:N/A] Exudate Type: [1:Serosanguineous] [N/A:N/A] Exudate Color: [1:red, brown] [N/A:N/A] Wound Margin: [1:Flat and Intact] [N/A:N/A] Granulation Amount: [1:Medium (34-66%)] [N/A:N/A] Granulation Quality: [1:Pink] [N/A:N/A] Necrotic Amount: [1:Medium (34-66%)] [N/A:N/A] Exposed Structures: [1:Fat Layer (Subcutaneous N/A Tissue) Exposed: Yes Fascia: No Tendon: No Muscle: No Joint: No Bone: No Small (1-33%)] [N/A:N/A] Treatment Notes Wound #1 (Left, Anterior Lower Leg) 1. Cleanse With Wound Cleanser 2. Periwound Care Skin Prep 3. Primary Dressing Applied Santyl 4. Secondary Dressing Foam Border Dressing 5. Secured With Office manager) Signed: 06/17/2019 5:32:11 PM By: Linton Ham MD Entered By: Linton Ham on 06/17/2019 07:11:29 -------------------------------------------------------------------------------- Multi-Disciplinary Care Plan Details Patient Name: Date of Service: Sarah [name], Sarah [name] 06/16/2019 11:00 AM Medical Record [id number] Patient Account Number: [id number] Date of Birth/Sex: Treating RN: 09-29-1932 (86 y.o. Tyanna Fetter Primary Care Amyre Segundo: Patrina Levering Other Clinician: Referring Lue [place]: Treating Caron Tardif/Extender:Robson, [name], Hal  T Weeks in Treatment: 4 Active Inactive Venous Leg Ulcer Nursing Diagnoses: Knowledge deficit related to disease process and management Potential for venous Insuffiency (use before diagnosis confirmed) Goals: Patient will maintain optimal edema control Date Initiated: 05/19/2019 Target Resolution Date: 07/18/2019 Goal Status: Active Patient/caregiver will verbalize understanding of disease process and disease management Date Initiated: 05/19/2019 Date Inactivated: 06/16/2019 Target Resolution Date: 06/20/2019 Goal Status: Met Interventions: Assess peripheral edema status every visit. Provide education on venous insufficiency Notes: Wound/Skin Impairment Nursing Diagnoses: Impaired tissue integrity Knowledge deficit related to ulceration/compromised skin integrity Goals: Patient/caregiver will verbalize understanding of skin care regimen Date Initiated: 05/19/2019 Target Resolution Date: 07/18/2019 Goal Status: Active Ulcer/skin breakdown will have a volume reduction of 30% by week 4 Date Initiated: 05/19/2019 Date Inactivated: 06/16/2019 Target Resolution Date: 06/20/2019 Goal Status: Met Ulcer/skin breakdown will have a volume reduction of 50% by week 8 Date Initiated: 06/16/2019 Target Resolution Date: 07/18/2019 Goal Status: Active Interventions: Assess patient/caregiver ability to obtain necessary supplies Assess patient/caregiver ability to perform ulcer/skin care regimen upon admission and as needed Assess ulceration(s) every visit Provide education on ulcer and skin care Notes: Electronic Signature(s) Signed: 06/16/2019 6:46:58 PM By: Levan Hurst RN, BSN Entered By: Levan Hurst on 06/16/2019 12:16:40 -------------------------------------------------------------------------------- Pain Assessment Details Patient Name: Date of Service: Sarah [name] 06/16/2019 11:00 AM Medical Record [id number] Patient Account Number: [id number] Date of  Birth/Sex: Treating RN: 1932/09/19 (86 y.o. [name] Primary Care Gwenevere Goga: Patrina Levering Other Clinician: Referring Briony Parveen: Treating Nelsie Domino/Extender:Robson, [name], Hal T Weeks in Treatment: 4 Active Problems Location of Pain Severity and Description of Pain Patient Has Paino No Site Locations Pain Management and Medication Current Pain Management: Electronic Signature(s) Signed: 06/19/2019 7:44:20 AM By: Kela Millin Entered By: Kela Millin on 06/16/2019 12:16:33 -------------------------------------------------------------------------------- Patient/Caregiver Education Details Patient Name: Date of Service: Sarah [name], Sarah J. 1/25/2021andnbsp11:00 AM Medical Record [id number] Patient Account Number: [id number] Date of Birth/Gender: Treating RN: 1933-03-16 (86 y.o. Felicity Fetter Primary Care Physician: Patrina Levering Other Clinician: Referring Physician: Treating Physician/Extender:Robson, [name], Junius Creamer in Treatment: 4 Education Assessment Education Provided To: Patient Education Topics Provided Venous: Methods: Explain/Verbal Responses: State content correctly Wound/Skin Impairment: Methods: Explain/Verbal Responses: State content correctly Electronic Signature(s) Signed: 06/16/2019 6:46:58 PM By: Levan Hurst RN, BSN Entered By: Levan Hurst on 06/16/2019 12:17:00 -------------------------------------------------------------------------------- Wound Assessment Details Patient Name: Date of Service: Sarah [name] 06/16/2019 11:00 AM Medical Record [id number] Patient Account Number: [id number] Date of Birth/Sex: Treating RN: [birth year] (86 y.o. F) Primary Care Keithan Dileonardo: Patrina Levering Other Clinician: Referring Genevieve Arbaugh: Treating Shila Kruczek/Extender:Robson, [name], Hal  T Weeks in Treatment: 4 Wound Status Wound Number: 1 Primary Venous Leg Ulcer Etiology: Wound  Location: Left Lower Leg - Anterior Wound Open Wounding Event: Gradually Appeared Status: Date Acquired: 12/21/2018 Comorbid Hypertension, Received Chemotherapy, Weeks Of Treatment: 4 History: Received Radiation Clustered Wound: No Photos Wound Measurements Length: (cm) 2.1 % Reduct Width: (cm) 1.8 % Reduct Depth: (cm) 0.2 Epitheli Area: (cm) 2.969 Tunneli Volume: (cm) 0.594 Undermi Wound Description Classification: Full Thickness Without Exposed Support Foul Od Structures Slough/ Wound Flat and Intact Margin: Exudate Medium Amount: Exudate Serosanguineous Type: Exudate red, brown Color: Wound Bed Granulation Amount: Medium (34-66%) Granulation Quality: Pink Fascia Necrotic Amount: Medium (34-66%) Fat Lay Necrotic Quality: Adherent Slough Tendon Muscle Joint E Bone Expose or After Cleansing: No Fibrino Yes Exposed Structure Exposed: No er (Subcutaneous Tissue) Exposed: Yes Exposed: No Exposed: No xposed: No d: No ion in Area: -5% ion in Volume: 30% alization: Small (1-33%) ng: No ning: No Treatment Notes Wound #1 (Left, Anterior Lower Leg) 1. Cleanse With Wound Cleanser 2. Periwound Care Skin Prep 3. Primary Dressing Applied Santyl 4. Secondary Dressing Foam Border Dressing 5. Secured With Office manager) Signed: 06/17/2019 5:09:30 PM By: Mikeal Hawthorne EMT/HBOT Entered By: Mikeal Hawthorne on 06/17/2019 15:48:35 -------------------------------------------------------------------------------- Vitals Details Patient Name: Date of Service: Sarah [name] 06/16/2019 11:00 AM Medical Record [id number] Patient Account Number: [id number] Date of Birth/Sex: Treating RN: 07-Jun-1932 (86 y.o. [name] Primary Care Zlatan Hornback: Lajean Manes T Other Clinician: Referring Marina Boerner: Treating Mcgwire Dasaro/Extender:Robson, [name], Hal T Weeks in Treatment: 4 Vital Signs Time Taken: 12:10 Temperature  (F): 98.1 Height (in): 64 Pulse (bpm): 85 Weight (lbs): 111 Respiratory Rate (breaths/min): 18 Body Mass Index (BMI): 19.1 Blood Pressure (mmHg): 135/71 Reference Range: 80 - 120 mg / dl Electronic Signature(s) Signed: 06/19/2019 7:44:20 AM By: Kela Millin Entered By: Kela Millin on 06/16/2019 12:16:21

## 2019-06-19 NOTE — Telephone Encounter (Signed)
[facility] has reached out to credentialing to see if they can help. She has not heard a response yet. Will keep message open to follow up.

## 2019-06-20 ENCOUNTER — Ambulatory Visit: Payer: Medicare Other

## 2019-06-25 ENCOUNTER — Inpatient Hospital Stay: Payer: Medicare Other

## 2019-06-25 ENCOUNTER — Inpatient Hospital Stay: Payer: Medicare Other | Attending: Hematology

## 2019-06-25 ENCOUNTER — Telehealth: Payer: Self-pay | Admitting: Hematology

## 2019-06-25 ENCOUNTER — Inpatient Hospital Stay (HOSPITAL_BASED_OUTPATIENT_CLINIC_OR_DEPARTMENT_OTHER): Payer: Medicare Other | Admitting: Hematology

## 2019-06-25 ENCOUNTER — Other Ambulatory Visit: Payer: Self-pay

## 2019-06-25 VITALS — BP 140/73 | HR 79 | Temp 98.2°F | Resp 18 | Ht 64.0 in | Wt 113.3 lb

## 2019-06-25 DIAGNOSIS — E039 Hypothyroidism, unspecified: Secondary | ICD-10-CM | POA: Diagnosis not present

## 2019-06-25 DIAGNOSIS — C865 Angioimmunoblastic T-cell lymphoma: Secondary | ICD-10-CM | POA: Insufficient documentation

## 2019-06-25 DIAGNOSIS — Z5189 Encounter for other specified aftercare: Secondary | ICD-10-CM | POA: Insufficient documentation

## 2019-06-25 DIAGNOSIS — Z5111 Encounter for antineoplastic chemotherapy: Secondary | ICD-10-CM

## 2019-06-25 DIAGNOSIS — L97929 Non-pressure chronic ulcer of unspecified part of left lower leg with unspecified severity: Secondary | ICD-10-CM

## 2019-06-25 DIAGNOSIS — C8440 Peripheral T-cell lymphoma, not classified, unspecified site: Secondary | ICD-10-CM

## 2019-06-25 DIAGNOSIS — I2699 Other pulmonary embolism without acute cor pulmonale: Secondary | ICD-10-CM | POA: Diagnosis not present

## 2019-06-25 DIAGNOSIS — Z853 Personal history of malignant neoplasm of breast: Secondary | ICD-10-CM | POA: Insufficient documentation

## 2019-06-25 DIAGNOSIS — Z5112 Encounter for antineoplastic immunotherapy: Secondary | ICD-10-CM | POA: Insufficient documentation

## 2019-06-25 DIAGNOSIS — Z7189 Other specified counseling: Secondary | ICD-10-CM

## 2019-06-25 DIAGNOSIS — Z95828 Presence of other vascular implants and grafts: Secondary | ICD-10-CM

## 2019-06-25 LAB — CBC WITH DIFFERENTIAL/PLATELET
Abs Immature Granulocytes: 0.09 10*3/uL — ABNORMAL HIGH (ref 0.00–0.07)
Basophils Absolute: 0.1 10*3/uL (ref 0.0–0.1)
Basophils Relative: 1 %
Eosinophils Absolute: 0.1 10*3/uL (ref 0.0–0.5)
Eosinophils Relative: 1 %
HCT: 39 % (ref 36.0–46.0)
Hemoglobin: 12.6 g/dL (ref 12.0–15.0)
Immature Granulocytes: 1 %
Lymphocytes Relative: 7 %
Lymphs Abs: 0.6 10*3/uL — ABNORMAL LOW (ref 0.7–4.0)
MCH: 29.4 pg (ref 26.0–34.0)
MCHC: 32.3 g/dL (ref 30.0–36.0)
MCV: 90.9 fL (ref 80.0–100.0)
Monocytes Absolute: 0.8 10*3/uL (ref 0.1–1.0)
Monocytes Relative: 10 %
Neutro Abs: 6.3 10*3/uL (ref 1.7–7.7)
Neutrophils Relative %: 80 %
Platelets: 223 10*3/uL (ref 150–400)
RBC: 4.29 MIL/uL (ref 3.87–5.11)
RDW: 16.7 % — ABNORMAL HIGH (ref 11.5–15.5)
WBC: 7.9 10*3/uL (ref 4.0–10.5)
nRBC: 0 % (ref 0.0–0.2)

## 2019-06-25 LAB — CMP (CANCER CENTER ONLY)
ALT: 9 U/L (ref 0–44)
AST: 11 U/L — ABNORMAL LOW (ref 15–41)
Albumin: 3.8 g/dL (ref 3.5–5.0)
Alkaline Phosphatase: 87 U/L (ref 38–126)
Anion gap: 9 (ref 5–15)
BUN: 25 mg/dL — ABNORMAL HIGH (ref 8–23)
CO2: 27 mmol/L (ref 22–32)
Calcium: 9.2 mg/dL (ref 8.9–10.3)
Chloride: 106 mmol/L (ref 98–111)
Creatinine: 0.7 mg/dL (ref 0.44–1.00)
GFR, Est AFR Am: >60 mL/min (ref >60)
GFR, Estimated: >60 mL/min (ref >60)
Glucose, Bld: 78 mg/dL (ref 70–99)
Potassium: 4.1 mmol/L (ref 3.5–5.1)
Sodium: 142 mmol/L (ref 135–145)
Total Bilirubin: 0.5 mg/dL (ref 0.3–1.2)
Total Protein: 6.7 g/dL (ref 6.5–8.1)

## 2019-06-25 LAB — LACTATE DEHYDROGENASE: LDH: 196 U/L — ABNORMAL HIGH (ref 98–192)

## 2019-06-25 MED ORDER — PALONOSETRON HCL INJECTION 0.25 MG/5ML
INTRAVENOUS | Status: AC
  Filled 2019-06-25: qty 5

## 2019-06-25 MED ORDER — SODIUM CHLORIDE 0.9 % IV SOLN
400.0000 mg/m2 | Freq: Once | INTRAVENOUS | Status: AC
  Administered 2019-06-25: 620 mg via INTRAVENOUS
  Filled 2019-06-25: qty 31

## 2019-06-25 MED ORDER — SODIUM CHLORIDE 0.9 % IV SOLN
Freq: Once | INTRAVENOUS | Status: AC
  Filled 2019-06-25: qty 250

## 2019-06-25 MED ORDER — DIPHENHYDRAMINE HCL 50 MG/ML IJ SOLN
50.0000 mg | Freq: Once | INTRAMUSCULAR | Status: AC
  Administered 2019-06-25: 11:00:00 50 mg via INTRAVENOUS

## 2019-06-25 MED ORDER — SODIUM CHLORIDE 0.9 % IV SOLN
1.2000 mg/kg | Freq: Once | INTRAVENOUS | Status: AC
  Administered 2019-06-25: 14:00:00 65 mg via INTRAVENOUS
  Filled 2019-06-25: qty 13

## 2019-06-25 MED ORDER — FAMOTIDINE IN NACL 20-0.9 MG/50ML-% IV SOLN
INTRAVENOUS | Status: AC
  Filled 2019-06-25: qty 50

## 2019-06-25 MED ORDER — SODIUM CHLORIDE 0.9 % IV SOLN
Freq: Once | INTRAVENOUS | Status: AC
  Filled 2019-06-25: qty 5

## 2019-06-25 MED ORDER — SODIUM CHLORIDE 0.9% FLUSH
10.0000 mL | Freq: Once | INTRAVENOUS | Status: AC
  Administered 2019-06-25: 09:00:00 10 mL
  Filled 2019-06-25: qty 10

## 2019-06-25 MED ORDER — ACETAMINOPHEN 325 MG PO TABS
ORAL_TABLET | ORAL | Status: AC
  Filled 2019-06-25: qty 2

## 2019-06-25 MED ORDER — ACETAMINOPHEN 325 MG PO TABS
650.0000 mg | ORAL_TABLET | Freq: Once | ORAL | Status: AC
  Administered 2019-06-25: 650 mg via ORAL

## 2019-06-25 MED ORDER — PALONOSETRON HCL INJECTION 0.25 MG/5ML
0.2500 mg | Freq: Once | INTRAVENOUS | Status: AC
  Administered 2019-06-25: 0.25 mg via INTRAVENOUS

## 2019-06-25 MED ORDER — FAMOTIDINE IN NACL 20-0.9 MG/50ML-% IV SOLN
20.0000 mg | Freq: Once | INTRAVENOUS | Status: AC
  Administered 2019-06-25: 20 mg via INTRAVENOUS

## 2019-06-25 MED ORDER — DIPHENHYDRAMINE HCL 50 MG/ML IJ SOLN
INTRAMUSCULAR | Status: AC
  Filled 2019-06-25: qty 1

## 2019-06-25 MED ORDER — HEPARIN SOD (PORK) LOCK FLUSH 100 UNIT/ML IV SOLN
500.0000 [IU] | Freq: Once | INTRAVENOUS | Status: AC | PRN
  Administered 2019-06-25: 500 [IU]
  Filled 2019-06-25: qty 5

## 2019-06-25 MED ORDER — DOXORUBICIN HCL CHEMO IV INJECTION 2 MG/ML
25.0000 mg/m2 | Freq: Once | INTRAVENOUS | Status: AC
  Administered 2019-06-25: 40 mg via INTRAVENOUS
  Filled 2019-06-25: qty 20

## 2019-06-25 MED ORDER — SODIUM CHLORIDE 0.9% FLUSH
10.0000 mL | INTRAVENOUS | Status: DC | PRN
  Administered 2019-06-25: 10 mL
  Filled 2019-06-25: qty 10

## 2019-06-25 NOTE — Telephone Encounter (Signed)
Appointments have already been made per 02/03 los during check out.

## 2019-06-25 NOTE — Patient Instructions (Signed)
[facility] Discharge Instructions for Patients Receiving Chemotherapy  Today you received the following chemotherapy agents: Adcretis/Adriamycin/Cytoxan.  To help prevent nausea and vomiting after your treatment, we encourage you to take your nausea medication as directed.   If you develop nausea and vomiting that is not controlled by your nausea medication, call the clinic.   BELOW ARE SYMPTOMS THAT SHOULD BE REPORTED IMMEDIATELY:  *FEVER GREATER THAN 100.5 F  *CHILLS WITH OR WITHOUT FEVER  NAUSEA AND VOMITING THAT IS NOT CONTROLLED WITH YOUR NAUSEA MEDICATION  *UNUSUAL SHORTNESS OF BREATH  *UNUSUAL BRUISING OR BLEEDING  TENDERNESS IN MOUTH AND THROAT WITH OR WITHOUT PRESENCE OF ULCERS  *URINARY PROBLEMS  *BOWEL PROBLEMS  UNUSUAL RASH Items with * indicate a potential emergency and should be followed up as soon as possible.  Feel free to call the clinic should you have any questions or concerns. The clinic phone number is [phone].  Please show the [facility] at check-in to the Emergency Department and triage nurse.

## 2019-06-25 NOTE — Progress Notes (Signed)
HEMATOLOGY/ONCOLOGY CLINIC NOTE  Date of Service: 06/25/2019  Patient Care Team: Lajean Manes, MD as PCP - General (Internal Medicine) Buford Dresser, MD as PCP - Cardiology (Cardiology)  REFERRING PHYSICIAN: Lajean Manes, MD  CHIEF COMPLAINTS/PURPOSE OF CONSULTATION:  Recently Diagnosed Angioimmunoblastic T cell lymphoma      HISTORY OF PRESENTING ILLNESS:   Sarah [name] is a wonderful 86 y.o. female who has been referred to [place] by Lajean Manes, MD for evaluation and management of Possible lymphoma . She is accompanied today by [name] . The pt reports that she is doing well overall.   Pending Surgical pathology from 03/17/2019 Dr.Manny will contact clinic about pathology   August 25th/27th she began coughing and sneezing. On Sept 3 went to doctors and they said it was allergies. She began having rashes (little red dots). Went to PCP and they prescribed prednisone and for several days and the medication helped. But as soon as she completed the prescription symptoms reoccurred.   She had a chest X Ray done because she was having a severe cough on 02/17/2019 revealing "bilateral effusions in this patient with history of breast cancer,of uncertain significance with question of right lower lobe pulmonary nodule, consider chest CT for further assessment. Atherosclerotic changes in the thoracic aorta."  Dr. Amy Martinique biopsied the rashes on her back 02/21/2019 and it was determined that it could be because of an allergy to medication. Dr.Stoneking stopped her blood pressure medication from September- October but ther rash still present. The itching started 1 to 2 months ago. The rash would come and go and was mostly on her back. She still has rash on her left thigh.  She is using triaconazole and cetrizine.  She has lumps behind ear and abdomin and she states that they have swollen twice the size within 1 and half months  She has no appetite and has changes in  her taste that began around August. She has changes in bowels. They are more loose and smaller In size. She also has no control over bowels.  She feels fatigued and it has increased since August.   She has a cough that starts in September  that has made her horse.   She had a CT scan on 02/27/2019 because she was having severe cough and her stomach was very extended. Revealing "1. Extensive lymphadenopathy throughout the chest, abdomen, and pelvis, with index lymph nodes identified above. Splenomegaly. Findings are most consistent with lymphoma with recurrent, metastatic breast malignancy less favored. 2. There is generally osteopenic appearance of the skeleton. There are numerous subtle hypodense lesions, particularly of the pelvis (e.g. Series 2, image 88) and select vertebral bodies, for example L4 (series 8, image 92). This is suspicious although not definitive for osseous metastatic disease. Characterization for metabolic activity by nuclear scintigraphic bone scan or PET-CT would be helpful to further evaluate. 3. Moderate right, small left pleural effusions with associated atelectasis or consolidation. Subpleural radiation fibrosis of the anterior right lung. 4. There is a new subpleural pulmonary nodule of the anterior right middle lobe measuring 6 mm (series 4, image 105), nonspecific. There is no obvious pleural thickening or nodularity definitive for metastatic disease. 5.  Small volume ascites. 6. There is a fluid attenuation lesion of the pancreatic uncinate measuring 2.4 x 1.3 cm (series 2, image 63), not substantially changed when compared to remote prior MR examination dated 02/23/2012 and likely sequelae of a prior pseudocyst versus incidental pancreatic IPMN. 7.  Cardiomegaly."  Hospitalized from 03/06/2019-03/08/2019. She presenting with acute shortness of breath, leg swelling,left hand swelling and some weight gain despite poor appetite. In ED, CTA chest revealed at least submassive PE,  moderate right pleural effusion, diffuse LAD concerning for lymphoma. Started on heparin drip. PCCM consulted for guidance. She had thoracocentesis with removal of 1200 cc cloudy exudative pleural fluid. Cardiology consulted for elevated which was thought to be demand ischemia from right heart strain in the setting of PE. Patient was transitioned to subcu Lovenox by pulmonology the next day and remained stable. Unusual appearance of tracheal air column at the thoracic inlet. Correlate with any history of swallowing difficulty or changes in voice with dedicated imaging with chest CT and or CT of the neck as indicated.  She has a nonhealing ulcer on her right tibialis anterior that starting weeping one week ago. Of note since the patient's last visit, pt has had CHEST XRAY - 2 VIEW (Accession [id number]) completed on 02/17/2019 with results revealing "Bilateral effusions in this patient with history of breast cancer,of uncertain significance with question of right lower lobepulmonary nodule, consider chest CT for furtherassessment.Unusual appearance of tracheal air column at the thoracic inlet.Correlate with any history of swallowing difficulty or changes in voice with dedicated imaging with chest CT and or CT of the neck as Indicated. Atherosclerotic changes in the thoracic aorta."  Of note since the patient's last visit, pt has had CT CHEST, ABDOMEN, AND PELVIS WITH CONTRAST (Accession [id number]) completed on 02/27/2019 with results revealing "1. Extensive lymphadenopathy throughout the chest, abdomen, and pelvis, with index lymph nodes identified above. Splenomegaly.Findings are most consistent with lymphoma with recurrent, metastatic breast malignancy less favored. 2. There is generally osteopenic appearance of the skeleton. There are numerous subtle hypodense lesions, particularly of the pelvis (e.g. Series 2, image 88) and select vertebral bodies, for example L4 (series 8, image 92). This is  suspicious although not definitive for osseous metastatic disease. Characterization for metabolic activity by nuclear scintigraphic bone scan or PET-CT would be helpful to further evaluate.  3. Moderate right, small left pleural effusions with associated atelectasis or consolidation. Subpleural radiation fibrosis of the anterior right lung.4. There is a new subpleural pulmonary nodule of the anterior right middle lobe measuring 6 mm (series 4, image 105), nonspecific. There is no obvious pleural thickening or nodularity definitive for metastatic disease.5.  Small volume ascites. 6. There is a fluid attenuation lesion of the pancreatic uncinate measuring 2.4 x 1.3 cm (series 2, image 63), not substantially changed when compared to remote prior MR examination dated 02/23/2012 and likely sequelae of a prior pseudocyst versus incidental pancreatic IPMN.7.  Cardiomegaly.8. Other chronic and incidental findings as detailed above. Aortic Atherosclerosis (ICD10-I70.0)."  Of note since the patient's last visit, pt has had PORTABLE CHEST 1 VIEW (Accession [id number]) completed on 03/06/2019 with results revealing "1. Moderate size right and small left pleural effusions appear not significantly changed since 02/27/19. 2. No new cardiopulmonary abnormality."  Of note since the patient's last visit, pt has had ECHOCARDIOGRAM  completed on 03/06/2019 with results revealing  "1. Left ventricular ejection fraction, by visual estimation, is 60 to 65%. The left ventricle has normal function. Normal left ventricular size. There is no left ventricular hypertrophy. 2. Left ventricular diastolic Doppler parameters are consistent with impaired relaxation pattern of LV diastolic filling. 3. Global right ventricle has mildly reduced systolic function.The right ventricular size is moderately enlarged. No increase in right ventricular wall thickness. 4. Left atrial size was normal. 5. Right  atrial size was mildly dilated. 6.  Trivial pericardial effusion is present. 7. The mitral valve is normal in structure. Trace mitral valve regurgitation. No evidence of mitral stenosis. 8. The tricuspid valve is normal in structure. Tricuspid valve regurgitation severe. 9. The aortic valve is normal in structure. Aortic valve regurgitation was not visualized by color flow Doppler. Structurally normal aortic valve, with no evidence of sclerosis or stenosis.10. The pulmonic valve was normal in structure. Pulmonic valve regurgitation is mild by color flow Doppler.11. Mildly elevated pulmonary artery systolic pressure.12. The inferior vena cava is normal in size with greater than 50% respiratory variability, suggesting right atrial pressure of 3 mmHg."  Of note since the patient's last visit, pt has had CT ANGIOGRAPHY CHEST WITH CONTRAST (Accession [id number]) completed on 03/06/2019 with results revealing "1. Pulmonary embolus arising from the distal left main pulmonary artery with extension into multiple left lower lobe pulmonary arterial branches. There is also incompletely obstructing pulmonary embolus in the proximal left upper lobe pulmonary artery. More distal pulmonary embolus in the right lower lobe pulmonary artery. Positive for acute PE with CT evidence of right heart strain (RV/LV Ratio = 1.6) consistent with at least submassive (intermediate risk) PE. The presence of right heart strain has been associated with an increased risk of morbidity and mortality. Please activate Code PE by paging [phone]. 2. Sizable pleural effusion on the right with smaller pleural effusion on the left. Consolidation and compressive atelectasis throughout most of the right lower lobe. Milder atelectasis left base. Nodular opacities on the right, likely metastatic foci.3.  Stable adenopathy compared to 1 week prior.4. Enlargement of spleen, incompletely visualized but documented CT 1 week prior. Concern for potential lymphoma. 5. Aortic atherosclerosis. No  aneurysm or dissection evident. Foci of coronary artery calcification noted."  Most recent lab results (03/06/2019) of CBC is as follows: all values are WNL except for Platelets at 72, Eosinophils Absolute at 0.6,Abs Immature Granulocytes at 0.08 .  On review of systems, pt reports rashes, digestive issues and denies back pain, abdominal pain and any other symptoms.   On PMHx the pt reports Hypothyroidism, MCI, Hypercholesterolemia, Tracheal anomaly, Hypertension, Pulmonary nodule, Atherosclerosis of aorta, anxiety, gait disorder, primary insomnia.   INTERVAL HISTORY: Sarah [name] is a 86 y.o. female here for evaluation and management of Angioimmunoblastic T-cell lymphoma. She is here for C4D1 of A-AVD. We are joined by her daughter today. The patient's last visit with [place] was on 06/04/2019. The pt reports that she is doing well overall.  The pt reports that she has been well but her leg ulcer is still causing her discomfort. Her ulcer is still oozing blood and thick, yellow pus. Pt has been to the [facility] and the physician wanted to do a surgical debridement. Pt did not want to do a surgical debridement so she was given Santyl and does daily bandage changes. The physician believes that it is a venous ulcer and did not recommend antibiotics. Pt visits this physician every two weeks. She will see him again on Monday.   Pt has been drinking a lot of Ensure and has been holding her weight steady. She has had no more rashes. She has noticed some increase in urinary frequency, especially at night. She usually uses the restroom 4-5 times per night and tries to limit fluids after 4 pm.   Pt received her first dose of the COVID19 vaccine on 01/26 and is already scheduled for her second dose.   Lab results today (  06/25/19) of CBC w/diff and CMP is as follows: all values are WNL except for RDW at 16.7, Lymphs Abs at 0.6K, Abs Immature Granulocytes at 0.09K, BUN at 25, AST at 11. 06/25/2019 LDH  at 196  On review of systems, pt reports increased urinary frequency and denies rashes, tingling/numbness in her hands/feet, abdominal pain, pain along the spine, fatigue, unexpected weight loss and any other symptoms.    MEDICAL HISTORY:  Past Medical History:  Diagnosis Date  . Allergy    codeine, thiazides  . Anxiety    new dx  . Arthritis   . Breast cancer ([place]) 03/14/12   bx=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,ER/PR=+,upper inner quad  . Cancer ([place])    breast  . Cataract   . DVT (deep venous thrombosis) ([place]) 02/2019   left leg  . Dyspnea   . Edema    both legs feet and toe, abdomen  . Glaucoma    laser treated years ago  . HOH (hard of hearing)   . Hyperlipemia   . Hypertension   . Hypothyroidism   . Pancreatic cyst    benign  . PONV (postoperative nausea and vomiting)   . Pulmonary embolism ([place]) 02/2019   bilateral   . Radiation 06/11/2012-07/12/2012   17 sessions 4250 cGy, 3 sessions 750 cGy  . Vertigo   . Wears glasses   . Wears partial dentures    partial upper     SURGICAL HISTORY: Past Surgical History:  Procedure Laterality Date  . ABDOMINAL HYSTERECTOMY  1966   1/2 ovary left in   . BREAST SURGERY  1992   lumpectomy-lt  . CATARACT EXTRACTION     b/l  . COLONOSCOPY    . EXCISION MASS NECK Left 03/17/2019    EXCISION MASS NECK (Left Neck)  . EXCISION MASS NECK Left 03/17/2019   Procedure: EXCISION MASS NECK;  Surgeon: Helayne Seminole, MD;  Location: [facility];  Service: ENT;  Laterality: Left;  . EYE SURGERY Bilateral    bilateral cataract removal  . IR IMAGING GUIDED PORT INSERTION  04/23/2019  . JOINT REPLACEMENT  2013   rt total knee  . JOINT REPLACEMENT  1995   lt total knee  . PARTIAL MASTECTOMY WITH NEEDLE LOCALIZATION  04/09/2012   Procedure: PARTIAL MASTECTOMY WITH NEEDLE LOCALIZATION;  Surgeon: Adin Hector, MD;  Location: [facility];  Service: General;  Laterality: Right;  . SKIN BIOPSY  Left 03/17/2019   LEFT THIGH  . SKIN BIOPSY Left 03/17/2019   Procedure: Skin Biopsy Left Thigh;  Surgeon: Helayne Seminole, MD;  Location: [facility];  Service: ENT;  Laterality: Left;  . TONSILLECTOMY    . TOTAL KNEE ARTHROPLASTY  08/16/2011   Procedure: TOTAL KNEE ARTHROPLASTY;  Surgeon: Ninetta Lights, MD;  Location: [facility];  Service: Orthopedics;  Laterality: Right;     SOCIAL HISTORY: Social History   Socioeconomic History  . Marital status: Widowed    Spouse name: Not on file  . Number of children: 2  . Years of education: Not on file  . Highest education level: Not on file  Occupational History  . Occupation: Retired    Comment: Community education officer   Tobacco Use  . Smoking status: Never Smoker  . Smokeless tobacco: Never Used  Substance and Sexual Activity  . Alcohol use: No  . Drug use: No  . Sexual activity: Not Currently  Other Topics Concern  . Not on file  Social History Narrative  .  Not on file   Social Determinants of Health   Financial Resource Strain:   . Difficulty of Paying Living Expenses: Not on file  Food Insecurity:   . Worried About Charity fundraiser in the Last Year: Not on file  . Ran Out of Food in the Last Year: Not on file  Transportation Needs:   . Lack of Transportation (Medical): Not on file  . Lack of Transportation (Non-Medical): Not on file  Physical Activity:   . Days of Exercise per Week: Not on file  . Minutes of Exercise per Session: Not on file  Stress:   . Feeling of Stress : Not on file  Social Connections:   . Frequency of Communication with Friends and Family: Not on file  . Frequency of Social Gatherings with Friends and Family: Not on file  . Attends Religious Services: Not on file  . Active Member of Clubs or Organizations: Not on file  . Attends Archivist Meetings: Not on file  . Marital Status: Not on file  Intimate Partner Violence:   . Fear of Current or Ex-Partner: Not on file  . Emotionally Abused:  Not on file  . Physically Abused: Not on file  . Sexually Abused: Not on file     FAMILY HISTORY: Family History  Problem Relation Age of Onset  . Heart disease Father   . Cancer Maternal Aunt        stomach     ALLERGIES:   is allergic to codeine; latex; and thiazide-type diuretics.   MEDICATIONS:  Current Outpatient Medications  Medication Sig Dispense Refill  . amLODipine (NORVASC) 2.5 MG tablet Take 2.5-5 mg by mouth See admin instructions. 5 mg in the morning, 2.5 mg in the evening    . apixaban (ELIQUIS) 5 MG TABS tablet Take 1 tablet (5 mg total) by mouth 2 (two) times daily. 180 tablet 0  . Cholecalciferol (VITAMIN D) 50 MCG (2000 UT) tablet Take 2,000 Units by mouth 2 (two) times daily.    . Cyanocobalamin (VITAMIN B-12 PO) Take 3,000 mcg by mouth daily.     Marland Kitchen gabapentin (NEURONTIN) 100 MG capsule Take 2 capsules (200 mg total) by mouth at bedtime. 60 capsule 2  . levothyroxine (SYNTHROID) 25 MCG tablet Take 25 mcg by mouth daily before breakfast.     . lidocaine-prilocaine (EMLA) cream Apply to affected area once 30 g 3  . Multiple Vitamins-Minerals (PRESERVISION AREDS PO) Take 1 capsule by mouth 2 (two) times daily.     . ondansetron (ZOFRAN) 8 MG tablet Take 1 tablet (8 mg total) by mouth 2 (two) times daily as needed. Start on the third day after chemotherapy. 30 tablet 1  . polyethylene glycol (MIRALAX / GLYCOLAX) 17 g packet Take 17 g by mouth daily.    . predniSONE (DELTASONE) 50 MG tablet Take 50 mg tablet po daily for 5 days starting day after treatment 5 tablet 4  . prochlorperazine (COMPAZINE) 10 MG tablet Take 1 tablet (10 mg total) by mouth every 6 (six) hours as needed (Nausea or vomiting). 30 tablet 1  . zolpidem (AMBIEN) 10 MG tablet Take 2.5 mg by mouth at bedtime.     . Calcium Carbonate (CALCIUM 600 PO) Take 600 mg by mouth daily.    . cetirizine (ZYRTEC) 10 MG tablet Take 10 mg by mouth daily.     . Cyanocobalamin (B-12 PO) Take by mouth.    .  erythromycin ophthalmic ointment SMARTSIG:1 Sparingly Right Eye  4 Times Daily    . fluticasone furoate-vilanterol (BREO ELLIPTA) 100-25 MCG/INH AEPB Inhale 1 puff into the lungs daily. (Patient not taking: Reported on 04/25/2019) 60 each 0  . mupirocin ointment (BACTROBAN) 2 % Place 1 application into the nose 2 (two) times daily. 22 g 0  . triamcinolone cream (KENALOG) 0.1 % Apply 1 application topically 3 (three) times daily.     . vitamin E 400 UNIT capsule Take by mouth.     No current facility-administered medications for this visit.     REVIEW OF SYSTEMS:   A 10+ POINT REVIEW OF SYSTEMS WAS OBTAINED including neurology, dermatology, psychiatry, cardiac, respiratory, lymph, extremities, GI, GU, Musculoskeletal, constitutional, breasts, reproductive, HEENT.  All pertinent positives are noted in the HPI.  All others are negative.   PHYSICAL EXAMINATION: ECOG FS:1 - Symptomatic but completely ambulatory  Vitals:   06/25/19 0942  BP: 140/73  Pulse: 79  Resp: 18  Temp: 98.2 F (36.8 C)  SpO2: 100%   Wt Readings from Last 3 Encounters:  06/25/19 113 lb 4.8 oz (51.4 kg)  06/04/19 112 lb (50.8 kg)  05/14/19 111 lb 11.2 oz (50.7 kg)   Body mass index is 19.45 kg/m.    Exam was given in a chair    GENERAL:alert, in no acute distress and comfortable SKIN: no acute rashes, no significant lesions EYES: conjunctiva are pink and non-injected, sclera anicteric OROPHARYNX: MMM, no exudates, no oropharyngeal erythema or ulceration NECK: supple, no JVD LYMPH:  no palpable lymphadenopathy in the cervical, axillary or inguinal regions LUNGS: clear to auscultation b/l with normal respiratory effort HEART: regular rate & rhythm ABDOMEN:  normoactive bowel sounds , non tender, not distended. No palpable hepatosplenomegaly.  Extremity: no pedal edema, Unhealing ulcer on mid lower left leg accompanied by some cellulitis, swelling, and discoloration PSYCH: alert & oriented x 3 with fluent  speech NEURO: no focal motor/sensory deficits  LABORATORY DATA:  I have reviewed the data as listed  CBC Latest Ref Rng & Units 06/25/2019 06/04/2019 05/14/2019  WBC 4.0 - 10.5 K/uL 7.9 6.9 7.7  Hemoglobin 12.0 - 15.0 g/dL 12.6 12.2 12.5  Hematocrit 36.0 - 46.0 % 39.0 37.4 38.9  Platelets 150 - 400 K/uL 223 248 249    CMP Latest Ref Rng & Units 06/25/2019 06/04/2019 05/14/2019  Glucose 70 - 99 mg/dL 78 104(H) 96  BUN 8 - 23 mg/dL 25(H) 26(H) 30(H)  Creatinine 0.44 - 1.00 mg/dL 0.70 0.71 0.71  Sodium 135 - 145 mmol/L 142 143 140  Potassium 3.5 - 5.1 mmol/L 4.1 3.8 4.1  Chloride 98 - 111 mmol/L 106 106 102  CO2 22 - 32 mmol/L 27 27 29   Calcium 8.9 - 10.3 mg/dL 9.2 9.4 9.9  Total Protein 6.5 - 8.1 g/dL 6.7 6.5 6.8  Total Bilirubin 0.3 - 1.2 mg/dL 0.5 0.5 0.4  Alkaline Phos 38 - 126 U/L 87 85 86  AST 15 - 41 U/L 11(L) 14(L) 12(L)  ALT 0 - 44 U/L 9 8 9    Component     Latest Ref Rng & Units 03/27/2019  Hepatitis B Surface Ag     NON REACTIVE NON REACTIVE  Hep B Core Total Ab     NON REACTIVE NON REACTIVE  HCV Ab     NON REACTIVE NON REACTIVE  LDH     98 - 192 U/L 252 (H)     04/08/2019 NM PET Image Initial (PI) Skull Base To Thigh (Accession [id number])  04/04/2019 PATHOLOGY  04/07/2019 ECHOCARDIOGRAM  03/28/2019 PATHOLOGY   03/06/2019 CT ANGIOGRAPHY CHEST WITH CONTRAST (Accession [id number])  04/07/2019 ECHOCARDIOGRAM  03/06/2019 ECHOCARDIOGRAM     03/06/2019 PORTABLE CHEST 1 VIEW (Accession [id number])    02/27/2019 CT CHEST, ABDOMEN, AND PELVIS WITH CONTRAST (Accession [id number])    02/17/2019 CHEST XRAY - 2 VIEW (Accession [id number])    RADIOGRAPHIC STUDIES: I have personally reviewed the radiological images as listed and agreed with the findings in the report. NM PET Image Restag (PS) Skull Base To Thigh  Result Date: 05/28/2019 CLINICAL DATA:  Subsequent treatment strategy for T-cell lymphoma. Status post chemo/immunotherapy. EXAM: NUCLEAR MEDICINE  PET SKULL BASE TO THIGH TECHNIQUE: 5.5 mCi F-18 FDG was injected intravenously. Full-ring PET imaging was performed from the skull base to thigh after the radiotracer. CT data was obtained and used for attenuation correction and anatomic localization. Fasting blood glucose: 90 mg/dl COMPARISON:  04/08/2019 FINDINGS: Mediastinal blood pool activity: SUV max 1.6 Liver activity: SUV max 2.8 NECK: Extensive muscular hypermetabolism, likely related to motion after radiopharmaceutical injection. Given this mild limitation, no residual cervical nodal hypermetabolism. Diffuse, left greater than right thyroid hypermetabolism, including at a S.U.V. max of 5.9. Similar to on the prior. Incidental CT findings: No residual cervical adenopathy. Index left posterior triangle node measures 4 mm on 36/4 versus 1.0 cm on the prior exam Bilateral carotid atherosclerosis. CHEST: No residual thoracic nodal hypermetabolism. No pulmonary parenchymal hypermetabolism. Incidental CT findings: Resolution of thoracic adenopathy. Right Port-A-Cath tip at superior caval/atrial junction. Aortic and coronary artery atherosclerosis. Right larger than left pleural effusions have resolved. Right upper lobe anterior subpleural radiation fibrosis. A vague nodule along the right minor fissure of 4 mm on 38/8 is not readily apparent on the prior. A right lower lobe subpleural 5 mm pulmonary nodule on 47/8 is similar. Lingular scarring. ABDOMEN/PELVIS: No residual hypermetabolic nodes within the abdomen or pelvis. Incidental CT findings: Index left periaortic node of 7 mm on 119/4 is at the site of a 2.3 cm nodal conglomerate on the prior. Left renal cyst. Index left inguinal node measures 5 mm on 170/4 versus 1.0 cm previously (when remeasured). Pelvic floor laxity. Hysterectomy. SKELETON: Mild diffuse marrow hypermetabolism is likely related to stimulation by chemotherapy. Incidental CT findings: none IMPRESSION: 1. Complete metabolic response to  therapy. 2. Resolution of bilateral pleural effusions. 3. Right lower lobe pulmonary nodule is similar. A nodule along the right minor fissure is felt to be new since the prior. Recommend attention on follow-up. 4. Coronary artery atherosclerosis. Aortic Atherosclerosis (ICD10-I70.0). 5. Similar thyroid hypermetabolism, which can be seen with thyroiditis. Electronically Signed   By: Abigail Miyamoto M.D.   On: 05/28/2019 12:03     ASSESSMENT & PLAN:   Sarah [name] is a 86 y.o. female with:  1. 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 SKIN, LEFT THIGH, BIOPSY: - Involvement by angioimmunoblastic T-cell lymphoma." -04/07/2019 Echocardiogram - normal EF -NM PET Image Initial (PI) Skull Base To Thigh (Accession [id number]) completed on 04/08/2019 with results revealing "1. Widespread hypermetabolic adenopathy in the neck, chest, and abdomen/pelvis, primarily in the Deauville 4 and Deauville 5 range. There is also hypermetabolic activity along the posterior nasopharynx in lingual tonsillar regions potentially indicating sites of involvement. 2. The subtle hypodensities in the spine and bony pelvis are not appreciably hypermetabolic may be incidental, surveillance is suggested. 3. Bilateral thyroid activity but especially on the left. Probably from thyroiditis.  4. A 6 mm subpleural nodule anteriorly in the right lower lobe not appreciably hypermetabolic but is below sensitive PET-CT size thresholds and merits surveillance. 5. Mildly accentuated diffuse splenic activity without splenomegaly or focal splenic lesion identified. 6. Other imaging findings of potential clinical significance: Aortic Atherosclerosis (ICD10-I70.0). Coronary atherosclerosis. Large right and small left pleural effusions. Moderate cardiomegaly. Small pericardial effusion. Mild mesenteric edema.  Large left renal cyst. Fullness of both renal collecting systems with suspected nonobstructive left nephrolithiasis. Degenerative grade 1 anterolisthesis at L4-5 at L5-S1. Trace pelvic ascites."  05/28/2019 PET/CT ([id number]) revealed "Complete metabolic response to therapy".   2. Pulmonary Embolism -extensive .likely related to extensive malignancy  3. H/o Breast Cancer  -The patient had bilateral diagnostic  mammography at [facility] 07/11/2011. This  showed some calcifications in the right  breast, which seemed a little bit more  prominent than prior. In the left breast  there was an area of possible  architectural distortion. However left  breast ultrasound the same day showed  no abnormality. 6 month followup was  suggested, and on 02/28/2012 the  patient again had bilateral diagnostic  mammography, now with right  ultrasonography. The microcalcifications  in the right breast appeared increased.  Ultrasound showed a hypoechoic lesion  measuring 5 mm, with no associated  shadowing. This had been previously  noted and appeared unchanged. Anterior  to that there was a small hypoechoic  mass measuring 4 mm in diameter.  There was felt to be suspicious, and on  03/14/2012 the patient underwent biopsy  of the right breast mass, showing ([id number]  [id number]) ductal carcinoma in situ,  intermediate grade, estrogen receptor  100% and progesterone receptor 100%  positive.   -Bilateral breast MRI was obtained  03/26/2012. This showed only post  biopsy changes in the right breast,  associated with an area of non-masslike  enhancement measuring 2.4 cm. The  left breast was unremarkable, and there  was no enlarged axillary or internal  mammary adenopathy noted.  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 The patient's  subsequent history is as detailed below.     PLAN: -Discussed pt labwork today, 06/25/19; blood counts and chemistries are nml -Discussed 06/25/2019 LDH is better at 196 -Pt has a Venous Ulcer, does not appear to be caused by lymphoma -Advised pt that the ulcer can be a source of infection-- no evidence of acute infection at this time. -Will continue to monitor LLE ulcer -Advised pt that venous compression, leg swelling, steroids, lymphoma and chemotherapy are all slowing down the healing of her leg ulcer -Recommend pt continue to elevate her legs and wear compression socks -Recommend pt f/u with second dose of COVID19 vaccine as scheduled -Continue 2000IU Cholecalciferol BID -The pt has no prohibitive toxicities from continuing C4D1 of A-AVD at this time -Discussed continuing to C5 with chemo + antibody vs continuing C5 & C6 with just Adcentris  -Advised pt that we would like to get the deepest response possible, but that improvement with C5 and C6 will be marginal due to her exceptional response so far  -Advised pt that relapse is greatest in the first 2 years, after which the risk drops significantly -Will return to clinic in 3 weeks   FOLLOW-UP: Follow-up as per currently schedule appointment for cycle 5 of treatment with port flush labs and MD visit  on 07/16/2019    The total time spent in the appt was 30 minutes and more than 50% was on counseling and direct patient cares.  All of the patient's questions were answered with apparent satisfaction. The patient knows to call the clinic with any problems, questions or concerns.    Sullivan Lone MD [place] AAHIVMS [facility] Hematology/Oncology Physician [facility]  (Office):       [phone] (Work cell):  [phone] (Fax):           [phone]  06/25/2019 10:57 AM  I, Yevette Edwards, am acting as a scribe for Dr. Sullivan Lone.   .I have reviewed the above documentation for accuracy and completeness, and I agree with the above. Brunetta Genera MD

## 2019-06-26 ENCOUNTER — Ambulatory Visit: Payer: Medicare Other

## 2019-06-27 ENCOUNTER — Inpatient Hospital Stay: Payer: Medicare Other

## 2019-06-27 ENCOUNTER — Other Ambulatory Visit: Payer: Self-pay

## 2019-06-27 VITALS — BP 150/71 | HR 78 | Temp 98.2°F | Resp 18

## 2019-06-27 DIAGNOSIS — Z5189 Encounter for other specified aftercare: Secondary | ICD-10-CM | POA: Diagnosis not present

## 2019-06-27 DIAGNOSIS — Z853 Personal history of malignant neoplasm of breast: Secondary | ICD-10-CM | POA: Diagnosis not present

## 2019-06-27 DIAGNOSIS — C8440 Peripheral T-cell lymphoma, not classified, unspecified site: Secondary | ICD-10-CM

## 2019-06-27 DIAGNOSIS — I2699 Other pulmonary embolism without acute cor pulmonale: Secondary | ICD-10-CM | POA: Diagnosis not present

## 2019-06-27 DIAGNOSIS — Z5111 Encounter for antineoplastic chemotherapy: Secondary | ICD-10-CM | POA: Diagnosis not present

## 2019-06-27 DIAGNOSIS — C865 Angioimmunoblastic T-cell lymphoma: Secondary | ICD-10-CM | POA: Diagnosis not present

## 2019-06-27 DIAGNOSIS — Z5112 Encounter for antineoplastic immunotherapy: Secondary | ICD-10-CM | POA: Diagnosis not present

## 2019-06-27 DIAGNOSIS — Z7189 Other specified counseling: Secondary | ICD-10-CM

## 2019-06-27 MED ORDER — PEGFILGRASTIM-JMDB 6 MG/0.6ML ~~LOC~~ SOSY
6.0000 mg | PREFILLED_SYRINGE | Freq: Once | SUBCUTANEOUS | Status: AC
  Administered 2019-06-27: 6 mg via SUBCUTANEOUS

## 2019-06-27 NOTE — Telephone Encounter (Signed)
[name] please advise. Thanks.

## 2019-06-27 NOTE — Patient Instructions (Signed)

## 2019-06-30 ENCOUNTER — Other Ambulatory Visit: Payer: Self-pay

## 2019-06-30 ENCOUNTER — Encounter (HOSPITAL_BASED_OUTPATIENT_CLINIC_OR_DEPARTMENT_OTHER): Payer: Medicare Other | Attending: Internal Medicine | Admitting: Internal Medicine

## 2019-06-30 DIAGNOSIS — J910 Malignant pleural effusion: Secondary | ICD-10-CM | POA: Diagnosis not present

## 2019-06-30 DIAGNOSIS — C865 Angioimmunoblastic T-cell lymphoma: Secondary | ICD-10-CM | POA: Diagnosis not present

## 2019-06-30 DIAGNOSIS — Z7901 Long term (current) use of anticoagulants: Secondary | ICD-10-CM | POA: Insufficient documentation

## 2019-06-30 DIAGNOSIS — S81802A Unspecified open wound, left lower leg, initial encounter: Secondary | ICD-10-CM | POA: Diagnosis not present

## 2019-06-30 DIAGNOSIS — Z86718 Personal history of other venous thrombosis and embolism: Secondary | ICD-10-CM | POA: Diagnosis not present

## 2019-06-30 DIAGNOSIS — L97829 Non-pressure chronic ulcer of other part of left lower leg with unspecified severity: Secondary | ICD-10-CM | POA: Insufficient documentation

## 2019-06-30 DIAGNOSIS — Z9221 Personal history of antineoplastic chemotherapy: Secondary | ICD-10-CM | POA: Insufficient documentation

## 2019-06-30 DIAGNOSIS — Z86711 Personal history of pulmonary embolism: Secondary | ICD-10-CM | POA: Insufficient documentation

## 2019-06-30 DIAGNOSIS — E039 Hypothyroidism, unspecified: Secondary | ICD-10-CM | POA: Insufficient documentation

## 2019-06-30 DIAGNOSIS — I890 Lymphedema, not elsewhere classified: Secondary | ICD-10-CM | POA: Diagnosis not present

## 2019-06-30 DIAGNOSIS — Z853 Personal history of malignant neoplasm of breast: Secondary | ICD-10-CM | POA: Diagnosis not present

## 2019-07-01 NOTE — Progress Notes (Signed)
Sarah, [name] ([id number]) Visit Report for 06/30/2019 HPI Details Patient Name: Date of Service: Sarah [name], Sarah [name] 06/30/2019 11:00 AM Medical Record [id number] Patient Account Number: [id number] Date of Birth/Sex: Treating RN: Oct 07, 1932 (86 y.o. Sarah [name] Primary Care Provider: Patrina Levering Other Clinician: Referring Provider: Treating Provider/Extender:Anjel Pardo, [name], Hal T Weeks in Treatment: 6 History of Present Illness HPI Description: ADMISSION 05/19/2019 This is a an 86 year old woman who is referred from her oncologist Dr.Kale for review of a wound on her left anterior mid tibial area. although there are certain parts of the notes that accompany her and also in [place] link that suggests she had a wound on the right anterior mid tibia, the patient is fairly insistent that the current wound is the only wound she is ever had. She relates that this began as a small area on her left anterior mid tibia sometime in August i.e. clearly before the diagnosis or treatment of her lymphoma began. She said that has been gradually increasing in size over time she is completely unaware of any other precipitating factors such as trauma etc. She does have what looks to be changes of chronic venous insufficiency in her legs with some degree of hemosiderin deposition and edema but has never had problems with this before. The wound itself is exceptionally painful there is no drainage she has been using Bactroban on this twice a day with the help of her daughter. She was given a course of doxycycline last week by Dr. Donzetta Matters which she is tolerating well She also in September developed a widespread rash that was pruritic. She was seen by her primary doctor and dermatologist I believe. Ultimately she had a CT scan in early October that documented widespread adenopathy with splenomegaly.Sarah [name] She was hospitalized from 03/06/2019 through 03/08/2019 presenting with shortness of  breath leg swelling on the left. She was diagnosed with a pulmonary embolism with a DVT in the left leg this predominantly involve the left posterior tibial veins and the left gastrocnemius veins. She is on Eliquis since then. The patient was ultimately diagnosed with angioimmunoblastic T-cell lymphoma and she has been on chemotherapy including Adriamycin Cytoxan and brentuximab. She was receiving a course of this every 3 weeks. She stated that the first round of chemotherapy caused her rash to go away. 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 So far she is tolerating her chemotherapy well although this will stretch out into the beginning of March 2021 The patient's major complaint is the pain she has burning pain even at night that sometimes awakens her from sleep. Past medical history; lymphoma as noted above, breast CA with a lumpectomy in 2013, right total knee replacement, hypothyroidism, pulmonary embolism as described, DVT in the left leg also as described. ABI in our clinic was 1.26 on the left 1/11; patient arrives with the surface of this wound completely covered and adherent eschar. Her daughter was able to show me pictures on her cell phone that suggest that this was getting better however it certainly does not look like that today. She will not let me even get close to this for debridement. Even washing the surface of the wound off seems to be on her. 1/25; 2-week follow-up. In general the wound on the left anterior tibia looks better although there is still surface debris under illumination. There is no ability to do mechanical debridement on this the patient simply cannot stand it. Even washing this off with Anasept and gauze  is prohibitive in terms of discomfort. We have been using Santyl and for this foreseeable future that we will need to continue. 2/8; 2-week follow-up. Unfortunately this does not look any better at all. Very tight adherent  debris. She barely will let me get even close to this for examination let alone debridement. We have been using Santyl without effect. The exact cause of this wound is not clear. She had chemotherapy for underlying lymphoma although the wound seems to have occurred before this. I change the primary dressing to Iodoflex with border foam and using her own stocking for edema control. The edema looks better today Electronic Signature(s) Signed: 06/30/2019 6:01:37 PM By: Linton Ham MD Entered By: Linton Ham on 06/30/2019 12:48:23 -------------------------------------------------------------------------------- Physical Exam Details Patient Name: Date of Service: Sarah [name] 06/30/2019 11:00 AM Medical Record [id number] Patient Account Number: [id number] Date of Birth/Sex: Treating RN: 1933-05-03 (86 y.o. Girl [name] Primary Care Provider: Patrina Levering Other Clinician: Referring Provider: Treating Provider/Extender:Sarah [name], [name], Hal T Weeks in Treatment: 6 Constitutional Sitting or standing Blood Pressure is within target range for patient.. Pulse regular and within target range for patient.Sarah [name] Respirations regular, non-labored and within target range.. Temperature is normal and within the target range for the patient.Sarah [name] Appears in no distress. Cardiovascular Pedal pulses are palpable. Edema control is improved. Notes Wound exam; the patient's wound is essentially the same in terms of surface area. Unfortunately more very adherent fibrinous debris is present. There is no obvious surrounding infection. Electronic Signature(s) Signed: 06/30/2019 6:01:37 PM By: Linton Ham MD Entered By: Linton Ham on 06/30/2019 12:50:22 -------------------------------------------------------------------------------- Physician Orders Details Patient Name: Date of Service: Sarah [name] 06/30/2019 11:00 AM Medical Record [id number] Patient Account  Number: [id number] Date of Birth/Sex: Treating RN: [birth year] (86 y.o. Sarah [name] Primary Care Provider: Lajean Manes T Other Clinician: Referring Provider: Treating Provider/Extender:Tiauna Whisnant, [name], Hal T Weeks in Treatment: 6 Verbal / Phone Orders: No Diagnosis Coding ICD-10 Coding Code Description [id number] Non-pressure chronic ulcer of other part of left lower leg with other specified severity I87.312 Chronic venous hypertension (idiopathic) with ulcer of left lower extremity C86.5 Angioimmunoblastic T-cell lymphoma Follow-up Appointments Return Appointment in 1 week. Dressing Change Frequency Wound #1 Left,Anterior Lower Leg Change Dressing every other day. Wound Cleansing Wound #1 Left,Anterior Lower Leg May shower and wash wound with soap and water. - on days that dressing is changed Primary Wound Dressing Wound #1 Left,Anterior Lower Leg Iodoflex Secondary Dressing Wound #1 Left,Anterior Lower Leg Foam Border - or large bandaid Edema Control Patient to wear own compression stockings - both legs daily Avoid standing for long periods of time Elevate legs to the level of the heart or above for 30 minutes daily and/or when sitting, a frequency of: - throughout the day Electronic Signature(s) Signed: 06/30/2019 6:01:37 PM By: Linton Ham MD Signed: 07/01/2019 5:30:42 PM By: Levan Hurst RN, BSN Entered By: Levan Hurst on 06/30/2019 11:23:25 -------------------------------------------------------------------------------- Problem List Details Patient Name: Date of Service: Sarah [name] 06/30/2019 11:00 AM Medical Record [id number] Patient Account Number: [id number] Date of Birth/Sex: Treating RN: 10-May-1933 (86 y.o. Sarah [name] Primary Care Provider: Patrina Levering Other Clinician: Referring Provider: Treating Provider/Extender:Carlise Stofer, [name], Junius Creamer in Treatment: 6 Active Problems ICD-10 Evaluated  Encounter Code Description Active Date Today Diagnosis L97.828 Non-pressure chronic ulcer of other part of left lower 05/19/2019 No Yes leg with other specified severity I87.312 Chronic venous hypertension (idiopathic) with ulcer of 05/19/2019 No Yes left lower  extremity C86.5 Angioimmunoblastic T-cell lymphoma 05/19/2019 No Yes Inactive Problems Resolved Problems Electronic Signature(s) Signed: 06/30/2019 6:01:37 PM By: Linton Ham MD Entered By: Linton Ham on 06/30/2019 12:41:33 -------------------------------------------------------------------------------- Progress Note Details Patient Name: Date of Service: Sarah [name] 06/30/2019 11:00 AM Medical Record [id number] Patient Account Number: [id number] Date of Birth/Sex: Treating RN: 04/23/1933 (86 y.o. Sarah [name] Primary Care Provider: Patrina Levering Other Clinician: Referring Provider: Treating Provider/Extender:Bryton Romagnoli, [name], Hal T Weeks in Treatment: 6 Subjective History of Present Illness (HPI) ADMISSION 05/19/2019 This is a an 86 year old woman who is referred from her oncologist Dr.Kale for review of a wound on her left anterior mid tibial area. although there are certain parts of the notes that accompany her and also in [place] link that suggests she had a wound on the right anterior mid tibia, the patient is fairly insistent that the current wound is the only wound she is ever had. She relates that this began as a small area on her left anterior mid tibia sometime in August i.e. clearly before the diagnosis or treatment of her lymphoma began. She said that has been gradually increasing in size over time she is completely unaware of any other precipitating factors such as trauma etc. She does have what looks to be changes of chronic venous insufficiency in her legs with some degree of hemosiderin deposition and edema but has never had problems with this before. The wound itself  is exceptionally painful there is no drainage she has been using Bactroban on this twice a day with the help of her daughter. She was given a course of doxycycline last week by Dr. Donzetta Matters which she is tolerating well She also in September developed a widespread rash that was pruritic. She was seen by her primary doctor and dermatologist I believe. Ultimately she had a CT scan in early October that documented widespread adenopathy with splenomegaly.Sarah [name] She was hospitalized from 03/06/2019 through 03/08/2019 presenting with shortness of breath leg swelling on the left. She was diagnosed with a pulmonary embolism with a DVT in the left leg this predominantly involve the left posterior tibial veins and the left gastrocnemius veins. She is on Eliquis since then. The patient was ultimately diagnosed with angioimmunoblastic T-cell lymphoma and she has been on chemotherapy including Adriamycin Cytoxan and brentuximab. She was receiving a course of this every 3 weeks. She stated that the first round of chemotherapy caused her rash to go away. 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 So far she is tolerating her chemotherapy well although this will stretch out into the beginning of March 2021 The patient's major complaint is the pain she has burning pain even at night that sometimes awakens her from sleep. Past medical history; lymphoma as noted above, breast CA with a lumpectomy in 2013, right total knee replacement, hypothyroidism, pulmonary embolism as described, DVT in the left leg also as described. ABI in our clinic was 1.26 on the left 1/11; patient arrives with the surface of this wound completely covered and adherent eschar. Her daughter was able to show me pictures on her cell phone that suggest that this was getting better however it certainly does not look like that today. She will not let me even get close to this for debridement. Even washing the surface of the  wound off seems to be on her. 1/25; 2-week follow-up. In general the wound on the left anterior tibia looks better although there is still surface debris under illumination.  There is no ability to do mechanical debridement on this the patient simply cannot stand it. Even washing this off with Anasept and gauze is prohibitive in terms of discomfort. We have been using Santyl and for this foreseeable future that we will need to continue. 2/8; 2-week follow-up. Unfortunately this does not look any better at all. Very tight adherent debris. She barely will let me get even close to this for examination let alone debridement. We have been using Santyl without effect. The exact cause of this wound is not clear. She had chemotherapy for underlying lymphoma although the wound seems to have occurred before this. I change the primary dressing to Iodoflex with border foam and using her own stocking for edema control. The edema looks better today Objective Constitutional Sitting or standing Blood Pressure is within target range for patient.. Pulse regular and within target range for patient.Sarah [name] Respirations regular, non-labored and within target range.. Temperature is normal and within the target range for the patient.Sarah [name] Appears in no distress. Vitals Time Taken: 10:59 AM, Height: 64 in, Weight: 111 lbs, BMI: 19.1, Temperature: 97.7 F, Pulse: 47 bpm, Respiratory Rate: 18 breaths/min, Blood Pressure: 121/74 mmHg. Cardiovascular Pedal pulses are palpable. Edema control is improved. General Notes: Wound exam; the patient's wound is essentially the same in terms of surface area. Unfortunately more very adherent fibrinous debris is present. There is no obvious surrounding infection. Integumentary (Hair, Skin) Wound #1 status is Open. Original cause of wound was Gradually Appeared. The wound is located on the Left,Anterior Lower Leg. The wound measures 2cm length x 1.6cm width x 0.2cm depth; 2.513cm^2 area  and 0.503cm^3 volume. There is Fat Layer (Subcutaneous Tissue) Exposed exposed. There is no tunneling or undermining noted. There is a medium amount of serosanguineous drainage noted. The wound margin is flat and intact. There is small (1-33%) pink granulation within the wound bed. There is a large (67-100%) amount of necrotic tissue within the wound bed including Adherent Slough. Assessment Active Problems ICD-10 Non-pressure chronic ulcer of other part of left lower leg with other specified severity Chronic venous hypertension (idiopathic) with ulcer of left lower extremity Angioimmunoblastic T-cell lymphoma Plan Follow-up Appointments: Return Appointment in 1 week. Dressing Change Frequency: Wound #1 Left,Anterior Lower Leg: Change Dressing every other day. Wound Cleansing: Wound #1 Left,Anterior Lower Leg: May shower and wash wound with soap and water. - on days that dressing is changed Primary Wound Dressing: Wound #1 Left,Anterior Lower Leg: Iodoflex Secondary Dressing: Wound #1 Left,Anterior Lower Leg: Foam Border - or large bandaid Edema Control: Patient to wear own compression stockings - both legs daily Avoid standing for long periods of time Elevate legs to the level of the heart or above for 30 minutes daily and/or when sitting, a frequency of: - throughout the day 1. The exact cause of this wound is unclear but I have not been able to get down to a clear enough based on this to even determine the depth. 2. My original plan had been to debride this to the point where I could do a biopsy if she would allow and then consider something to try and get epithelialization. I have not been able to even get close to this. 3. I change the dressing to Iodoflex see again about debridement. Electronic Signature(s) Signed: 06/30/2019 6:01:37 PM By: Linton Ham MD Entered By: Linton Ham on 06/30/2019  12:53:10 -------------------------------------------------------------------------------- SuperBill Details Patient Name: Date of Service: Sarah [name] 06/30/2019 Medical Record [id number] Patient Account Number: [id number] Date of  Birth/Sex: Treating RN: 09/29/1932 (86 y.o. Sarah [name] Primary Care Provider: Lajean Manes T Other Clinician: Referring Provider: Treating Provider/Extender:Takera Rayl, [name], Hal T Weeks in Treatment: 6 Diagnosis Coding ICD-10 Codes Code Description [phone] Non-pressure chronic ulcer of other part of left lower leg with other specified severity I87.312 Chronic venous hypertension (idiopathic) with ulcer of left lower extremity C86.5 Angioimmunoblastic T-cell lymphoma Facility Procedures CPT4 Code: [id number] Description: 85631 - WOUND CARE VISIT-LEV 3 EST PT Modifier: Quantity: 1 Physician Procedures CPT4 Code Description: [id number] - WC PHYS LEVEL 3 - EST PT ICD-10 Diagnosis Description L97.828 Non-pressure chronic ulcer of other part of left lower leg w severity I87.312 Chronic venous hypertension (idiopathic) with ulcer of left Modifier: ith other specifie lower extremity Quantity: 1 d Electronic Signature(s) Signed: 07/01/2019 5:08:08 PM By: Linton Ham MD Signed: 07/01/2019 5:30:42 PM By: Levan Hurst RN, BSN Previous Signature: 06/30/2019 6:01:37 PM Version By: Linton Ham MD Entered By: Levan Hurst on 06/30/2019 19:06:58

## 2019-07-03 NOTE — Telephone Encounter (Signed)
[name] [facility] 07/03/2019 8:14 AM I see the message.  I will follow back up with credentialing this morning and see if I can get a response from anyone.     [name]

## 2019-07-03 NOTE — Telephone Encounter (Signed)
I sent [name] an email with pt;s information to make sure she received the message.

## 2019-07-07 ENCOUNTER — Ambulatory Visit: Payer: Medicare Other

## 2019-07-08 NOTE — Telephone Encounter (Signed)
Tammy D and Lauren are aware and working on this issue.

## 2019-07-14 ENCOUNTER — Other Ambulatory Visit: Payer: Self-pay

## 2019-07-14 ENCOUNTER — Encounter (HOSPITAL_BASED_OUTPATIENT_CLINIC_OR_DEPARTMENT_OTHER): Payer: Medicare Other | Admitting: Internal Medicine

## 2019-07-14 DIAGNOSIS — Z853 Personal history of malignant neoplasm of breast: Secondary | ICD-10-CM | POA: Diagnosis not present

## 2019-07-14 DIAGNOSIS — C865 Angioimmunoblastic T-cell lymphoma: Secondary | ICD-10-CM | POA: Diagnosis not present

## 2019-07-14 DIAGNOSIS — I87312 Chronic venous hypertension (idiopathic) with ulcer of left lower extremity: Secondary | ICD-10-CM | POA: Diagnosis not present

## 2019-07-14 DIAGNOSIS — Z9221 Personal history of antineoplastic chemotherapy: Secondary | ICD-10-CM | POA: Diagnosis not present

## 2019-07-14 DIAGNOSIS — L97822 Non-pressure chronic ulcer of other part of left lower leg with fat layer exposed: Secondary | ICD-10-CM | POA: Diagnosis not present

## 2019-07-14 DIAGNOSIS — Z86718 Personal history of other venous thrombosis and embolism: Secondary | ICD-10-CM | POA: Diagnosis not present

## 2019-07-14 DIAGNOSIS — Z86711 Personal history of pulmonary embolism: Secondary | ICD-10-CM | POA: Diagnosis not present

## 2019-07-14 DIAGNOSIS — L97829 Non-pressure chronic ulcer of other part of left lower leg with unspecified severity: Secondary | ICD-10-CM | POA: Diagnosis not present

## 2019-07-16 ENCOUNTER — Inpatient Hospital Stay: Payer: Medicare Other

## 2019-07-16 ENCOUNTER — Telehealth: Payer: Self-pay

## 2019-07-16 ENCOUNTER — Inpatient Hospital Stay (HOSPITAL_BASED_OUTPATIENT_CLINIC_OR_DEPARTMENT_OTHER): Payer: Medicare Other | Admitting: Hematology

## 2019-07-16 ENCOUNTER — Other Ambulatory Visit: Payer: Self-pay | Admitting: *Deleted

## 2019-07-16 ENCOUNTER — Other Ambulatory Visit: Payer: Self-pay

## 2019-07-16 ENCOUNTER — Telehealth: Payer: Self-pay | Admitting: Critical Care Medicine

## 2019-07-16 VITALS — BP 143/76 | HR 75 | Temp 97.8°F | Resp 18 | Ht 64.0 in | Wt 110.4 lb

## 2019-07-16 DIAGNOSIS — F419 Anxiety disorder, unspecified: Secondary | ICD-10-CM | POA: Diagnosis not present

## 2019-07-16 DIAGNOSIS — R002 Palpitations: Secondary | ICD-10-CM

## 2019-07-16 DIAGNOSIS — Z5112 Encounter for antineoplastic immunotherapy: Secondary | ICD-10-CM | POA: Diagnosis not present

## 2019-07-16 DIAGNOSIS — Z7189 Other specified counseling: Secondary | ICD-10-CM

## 2019-07-16 DIAGNOSIS — C8440 Peripheral T-cell lymphoma, not classified, unspecified site: Secondary | ICD-10-CM

## 2019-07-16 DIAGNOSIS — Z853 Personal history of malignant neoplasm of breast: Secondary | ICD-10-CM | POA: Diagnosis not present

## 2019-07-16 DIAGNOSIS — Z5189 Encounter for other specified aftercare: Secondary | ICD-10-CM | POA: Diagnosis not present

## 2019-07-16 DIAGNOSIS — Z5111 Encounter for antineoplastic chemotherapy: Secondary | ICD-10-CM

## 2019-07-16 DIAGNOSIS — Z95828 Presence of other vascular implants and grafts: Secondary | ICD-10-CM

## 2019-07-16 DIAGNOSIS — I2699 Other pulmonary embolism without acute cor pulmonale: Secondary | ICD-10-CM | POA: Diagnosis not present

## 2019-07-16 DIAGNOSIS — C50211 Malignant neoplasm of upper-inner quadrant of right female breast: Secondary | ICD-10-CM

## 2019-07-16 DIAGNOSIS — C865 Angioimmunoblastic T-cell lymphoma: Secondary | ICD-10-CM | POA: Diagnosis not present

## 2019-07-16 LAB — CBC WITH DIFFERENTIAL/PLATELET
Abs Immature Granulocytes: 0.06 10*3/uL (ref 0.00–0.07)
Basophils Absolute: 0.1 10*3/uL (ref 0.0–0.1)
Basophils Relative: 1 %
Eosinophils Absolute: 0 10*3/uL (ref 0.0–0.5)
Eosinophils Relative: 1 %
HCT: 39.6 % (ref 36.0–46.0)
Hemoglobin: 12.9 g/dL (ref 12.0–15.0)
Immature Granulocytes: 1 %
Lymphocytes Relative: 7 %
Lymphs Abs: 0.5 10*3/uL — ABNORMAL LOW (ref 0.7–4.0)
MCH: 30.4 pg (ref 26.0–34.0)
MCHC: 32.6 g/dL (ref 30.0–36.0)
MCV: 93.4 fL (ref 80.0–100.0)
Monocytes Absolute: 0.7 10*3/uL (ref 0.1–1.0)
Monocytes Relative: 10 %
Neutro Abs: 5.6 10*3/uL (ref 1.7–7.7)
Neutrophils Relative %: 80 %
Platelets: 209 10*3/uL (ref 150–400)
RBC: 4.24 MIL/uL (ref 3.87–5.11)
RDW: 15.8 % — ABNORMAL HIGH (ref 11.5–15.5)
WBC: 7.1 10*3/uL (ref 4.0–10.5)
nRBC: 0 % (ref 0.0–0.2)

## 2019-07-16 LAB — COMPREHENSIVE METABOLIC PANEL
ALT: 9 U/L (ref 0–44)
AST: 11 U/L — ABNORMAL LOW (ref 15–41)
Albumin: 3.8 g/dL (ref 3.5–5.0)
Alkaline Phosphatase: 85 U/L (ref 38–126)
Anion gap: 10 (ref 5–15)
BUN: 22 mg/dL (ref 8–23)
CO2: 27 mmol/L (ref 22–32)
Calcium: 9.4 mg/dL (ref 8.9–10.3)
Chloride: 107 mmol/L (ref 98–111)
Creatinine, Ser: 0.71 mg/dL (ref 0.44–1.00)
GFR calc Af Amer: >60 mL/min (ref >60)
GFR calc non Af Amer: >60 mL/min (ref >60)
Glucose, Bld: 104 mg/dL — ABNORMAL HIGH (ref 70–99)
Potassium: 3.4 mmol/L — ABNORMAL LOW (ref 3.5–5.1)
Sodium: 144 mmol/L (ref 135–145)
Total Bilirubin: 0.6 mg/dL (ref 0.3–1.2)
Total Protein: 6.6 g/dL (ref 6.5–8.1)

## 2019-07-16 LAB — TSH: TSH: 1.135 u[IU]/mL (ref 0.308–3.960)

## 2019-07-16 LAB — T4, FREE: Free T4: 0.99 ng/dL (ref 0.61–1.12)

## 2019-07-16 MED ORDER — ONDANSETRON HCL 4 MG/2ML IJ SOLN
INTRAMUSCULAR | Status: AC
  Filled 2019-07-16: qty 2

## 2019-07-16 MED ORDER — DIPHENHYDRAMINE HCL 50 MG/ML IJ SOLN
INTRAMUSCULAR | Status: AC
  Filled 2019-07-16: qty 1

## 2019-07-16 MED ORDER — SODIUM CHLORIDE 0.9% FLUSH
10.0000 mL | INTRAVENOUS | Status: DC | PRN
  Administered 2019-07-16: 10 mL
  Filled 2019-07-16: qty 10

## 2019-07-16 MED ORDER — FAMOTIDINE IN NACL 20-0.9 MG/50ML-% IV SOLN
INTRAVENOUS | Status: AC
  Filled 2019-07-16: qty 50

## 2019-07-16 MED ORDER — DEXAMETHASONE SODIUM PHOSPHATE 10 MG/ML IJ SOLN
5.0000 mg | Freq: Once | INTRAMUSCULAR | Status: AC
  Administered 2019-07-16: 5 mg via INTRAVENOUS

## 2019-07-16 MED ORDER — ACETAMINOPHEN 325 MG PO TABS
650.0000 mg | ORAL_TABLET | Freq: Once | ORAL | Status: AC
  Administered 2019-07-16: 650 mg via ORAL

## 2019-07-16 MED ORDER — ACETAMINOPHEN 325 MG PO TABS
ORAL_TABLET | ORAL | Status: AC
  Filled 2019-07-16: qty 2

## 2019-07-16 MED ORDER — DEXAMETHASONE SODIUM PHOSPHATE 10 MG/ML IJ SOLN
INTRAMUSCULAR | Status: AC
  Filled 2019-07-16: qty 1

## 2019-07-16 MED ORDER — SODIUM CHLORIDE 0.9 % IV SOLN
Freq: Once | INTRAVENOUS | Status: AC
  Filled 2019-07-16: qty 250

## 2019-07-16 MED ORDER — FAMOTIDINE IN NACL 20-0.9 MG/50ML-% IV SOLN
20.0000 mg | Freq: Once | INTRAVENOUS | Status: AC
  Administered 2019-07-16: 20 mg via INTRAVENOUS

## 2019-07-16 MED ORDER — SODIUM CHLORIDE 0.9% FLUSH
10.0000 mL | Freq: Once | INTRAVENOUS | Status: AC
  Administered 2019-07-16: 10 mL
  Filled 2019-07-16: qty 10

## 2019-07-16 MED ORDER — SODIUM CHLORIDE 0.9 % IV SOLN: Freq: Once | INTRAVENOUS | Status: DC

## 2019-07-16 MED ORDER — HEPARIN SOD (PORK) LOCK FLUSH 100 UNIT/ML IV SOLN
500.0000 [IU] | Freq: Once | INTRAVENOUS | Status: AC | PRN
  Administered 2019-07-16: 500 [IU]
  Filled 2019-07-16: qty 5

## 2019-07-16 MED ORDER — SODIUM CHLORIDE 0.9 % IV SOLN
1.2000 mg/kg | Freq: Once | INTRAVENOUS | Status: AC
  Administered 2019-07-16: 65 mg via INTRAVENOUS
  Filled 2019-07-16: qty 13

## 2019-07-16 MED ORDER — DIPHENHYDRAMINE HCL 50 MG/ML IJ SOLN
50.0000 mg | Freq: Once | INTRAMUSCULAR | Status: AC
  Administered 2019-07-16: 50 mg via INTRAVENOUS

## 2019-07-16 MED ORDER — ONDANSETRON HCL 4 MG/2ML IJ SOLN
4.0000 mg | Freq: Once | INTRAMUSCULAR | Status: AC
  Administered 2019-07-16: 4 mg via INTRAVENOUS

## 2019-07-16 NOTE — Telephone Encounter (Signed)
-----   Message from Gardenia Phlegm, NP sent at 07/16/2019 10:03 AM EST ----- Please instruct patient to increase potassium in diet, it is slightly low. ----- Message ----- From: Buel Ream, [facility] [place] Sent: 07/16/2019   8:57 AM EST To: Chauncey Cruel, MD

## 2019-07-16 NOTE — Patient Instructions (Signed)

## 2019-07-16 NOTE — Telephone Encounter (Signed)
Instructions    Return in about 6 months (around 10/24/2019). Thank you for visiting Dr. Carlis Abbott at [facility]. We recommend the following:    Orders Placed This Encounter  Procedures  . DG Chest 2 View        Orders Placed This Encounter  Procedures  . DG Chest 2 View    Standing Status:   Future    Number of Occurrences:   1    Standing Expiration Date:   06/25/2020    Order Specific Question:   Reason for Exam (SYMPTOM  OR DIAGNOSIS REQUIRED)    Answer:   pleural effusion    Order Specific Question:   Preferred imaging location?    Answer:   Internal    Order Specific Question:   Radiology Contrast Protocol - do NOT remove file path    Answer:   [email or web address]    Chest xray in early February at [facility] and I will follow up with you afterwards with the results. Call us if you develop new pulmonary symptoms.    Return in about 6 months (around 10/24/2019).     Attempted to call pt's daughter [name] but unable to reach. Left message for her to return call.

## 2019-07-16 NOTE — Progress Notes (Signed)
HEMATOLOGY/ONCOLOGY CLINIC NOTE  Date of Service: 07/16/2019  Patient Care Team: Lajean Manes, MD as PCP - General (Internal Medicine) Buford Dresser, MD as PCP - Cardiology (Cardiology)  REFERRING PHYSICIAN: Lajean Manes, MD  CHIEF COMPLAINTS/PURPOSE OF CONSULTATION:  Continue mx of Recently Diagnosed Angioimmunoblastic T cell lymphoma      HISTORY OF PRESENTING ILLNESS:   Sarah [name] is a wonderful 86 y.o. female who has been referred to [place] by Lajean Manes, MD for evaluation and management of Possible lymphoma . She is accompanied today by her daughter Sarah [name] . The pt reports that she is doing well overall.   Pending Surgical pathology from 03/17/2019 Dr.Manny will contact clinic about pathology   August 25th/27th she began coughing and sneezing. On Sept 3 went to doctors and they said it was allergies. She began having rashes (little red dots). Went to PCP and they prescribed prednisone and for several days and the medication helped. But as soon as she completed the prescription symptoms reoccurred.   She had a chest X Ray done because she was having a severe cough on 02/17/2019 revealing "bilateral effusions in this patient with history of breast cancer,of uncertain significance with question of right lower lobe pulmonary nodule, consider chest CT for further assessment. Atherosclerotic changes in the thoracic aorta."  Dr. Amy Martinique biopsied the rashes on her back 02/21/2019 and it was determined that it could be because of an allergy to medication. Dr.Stoneking stopped her blood pressure medication from September- October but ther rash still present. The itching started 1 to 2 months ago. The rash would come and go and was mostly on her back. She still has rash on her left thigh.  She is using triaconazole and cetrizine.  She has lumps behind ear and abdomin and she states that they have swollen twice the size within 1 and half months  She has no appetite and  has changes in her taste that began around August. She has changes in bowels. They are more loose and smaller In size. She also has no control over bowels.  She feels fatigued and it has increased since August.   She has a cough that starts in September  that has made her horse.   She had a CT scan on 02/27/2019 because she was having severe cough and her stomach was very extended. Revealing "1. Extensive lymphadenopathy throughout the chest, abdomen, and pelvis, with index lymph nodes identified above. Splenomegaly. Findings are most consistent with lymphoma with recurrent, metastatic breast malignancy less favored. 2. There is generally osteopenic appearance of the skeleton. There are numerous subtle hypodense lesions, particularly of the pelvis (e.g. Series 2, image 88) and select vertebral bodies, for example L4 (series 8, image 92). This is suspicious although not definitive for osseous metastatic disease. Characterization for metabolic activity by nuclear scintigraphic bone scan or PET-CT would be helpful to further evaluate. 3. Moderate right, small left pleural effusions with associated atelectasis or consolidation. Subpleural radiation fibrosis of the anterior right lung. 4. There is a new subpleural pulmonary nodule of the anterior right middle lobe measuring 6 mm (series 4, image 105), nonspecific. There is no obvious pleural thickening or nodularity definitive for metastatic disease. 5.  Small volume ascites. 6. There is a fluid attenuation lesion of the pancreatic uncinate measuring 2.4 x 1.3 cm (series 2, image 63), not substantially changed when compared to remote prior MR examination dated 02/23/2012 and likely sequelae of a prior pseudocyst versus incidental pancreatic IPMN. 7.  Cardiomegaly."  Hospitalized from 03/06/2019-03/08/2019. She presenting with acute shortness of breath, leg swelling,left hand swelling and some weight gain despite poor appetite. In ED, CTA chest revealed at least  submassive PE, moderate right pleural effusion, diffuse LAD concerning for lymphoma. Started on heparin drip. PCCM consulted for guidance. She had thoracocentesis with removal of 1200 cc cloudy exudative pleural fluid. Cardiology consulted for elevated which was thought to be demand ischemia from right heart strain in the setting of PE. Patient was transitioned to subcu Lovenox by pulmonology the next day and remained stable. Unusual appearance of tracheal air column at the thoracic inlet. Correlate with any history of swallowing difficulty or changes in voice with dedicated imaging with chest CT and or CT of the neck as indicated.  She has a nonhealing ulcer on her right tibialis anterior that starting weeping one week ago. Of note since the patient's last visit, pt has had CHEST XRAY - 2 VIEW (Accession [id number]) completed on 02/17/2019 with results revealing "Bilateral effusions in this patient with history of breast cancer,of uncertain significance with question of right lower lobepulmonary nodule, consider chest CT for furtherassessment.Unusual appearance of tracheal air column at the thoracic inlet.Correlate with any history of swallowing difficulty or changes in voice with dedicated imaging with chest CT and or CT of the neck as Indicated. Atherosclerotic changes in the thoracic aorta."  Of note since the patient's last visit, pt has had CT CHEST, ABDOMEN, AND PELVIS WITH CONTRAST (Accession [id number]) completed on 02/27/2019 with results revealing "1. Extensive lymphadenopathy throughout the chest, abdomen, and pelvis, with index lymph nodes identified above. Splenomegaly.Findings are most consistent with lymphoma with recurrent, metastatic breast malignancy less favored. 2. There is generally osteopenic appearance of the skeleton. There are numerous subtle hypodense lesions, particularly of the pelvis (e.g. Series 2, image 88) and select vertebral bodies, for example L4 (series 8, image 92).  This is suspicious although not definitive for osseous metastatic disease. Characterization for metabolic activity by nuclear scintigraphic bone scan or PET-CT would be helpful to further evaluate.  3. Moderate right, small left pleural effusions with associated atelectasis or consolidation. Subpleural radiation fibrosis of the anterior right lung.4. There is a new subpleural pulmonary nodule of the anterior right middle lobe measuring 6 mm (series 4, image 105), nonspecific. There is no obvious pleural thickening or nodularity definitive for metastatic disease.5.  Small volume ascites. 6. There is a fluid attenuation lesion of the pancreatic uncinate measuring 2.4 x 1.3 cm (series 2, image 63), not substantially changed when compared to remote prior MR examination dated 02/23/2012 and likely sequelae of a prior pseudocyst versus incidental pancreatic IPMN.7.  Cardiomegaly.8. Other chronic and incidental findings as detailed above. Aortic Atherosclerosis (ICD10-I70.0)."  Of note since the patient's last visit, pt has had PORTABLE CHEST 1 VIEW (Accession [id number]) completed on 03/06/2019 with results revealing "1. Moderate size right and small left pleural effusions appear not significantly changed since 02/27/19. 2. No new cardiopulmonary abnormality."  Of note since the patient's last visit, pt has had ECHOCARDIOGRAM  completed on 03/06/2019 with results revealing  "1. Left ventricular ejection fraction, by visual estimation, is 60 to 65%. The left ventricle has normal function. Normal left ventricular size. There is no left ventricular hypertrophy. 2. Left ventricular diastolic Doppler parameters are consistent with impaired relaxation pattern of LV diastolic filling. 3. Global right ventricle has mildly reduced systolic function.The right ventricular size is moderately enlarged. No increase in right ventricular wall thickness. 4. Left atrial size was normal.  5. Right atrial size was mildly dilated.  6. Trivial pericardial effusion is present. 7. The mitral valve is normal in structure. Trace mitral valve regurgitation. No evidence of mitral stenosis. 8. The tricuspid valve is normal in structure. Tricuspid valve regurgitation severe. 9. The aortic valve is normal in structure. Aortic valve regurgitation was not visualized by color flow Doppler. Structurally normal aortic valve, with no evidence of sclerosis or stenosis.10. The pulmonic valve was normal in structure. Pulmonic valve regurgitation is mild by color flow Doppler.11. Mildly elevated pulmonary artery systolic pressure.12. The inferior vena cava is normal in size with greater than 50% respiratory variability, suggesting right atrial pressure of 3 mmHg."  Of note since the patient's last visit, pt has had CT ANGIOGRAPHY CHEST WITH CONTRAST (Accession [id number]) completed on 03/06/2019 with results revealing "1. Pulmonary embolus arising from the distal left main pulmonary artery with extension into multiple left lower lobe pulmonary arterial branches. There is also incompletely obstructing pulmonary embolus in the proximal left upper lobe pulmonary artery. More distal pulmonary embolus in the right lower lobe pulmonary artery. Positive for acute PE with CT evidence of right heart strain (RV/LV Ratio = 1.6) consistent with at least submassive (intermediate risk) PE. The presence of right heart strain has been associated with an increased risk of morbidity and mortality. Please activate Code PE by paging [phone]. 2. Sizable pleural effusion on the right with smaller pleural effusion on the left. Consolidation and compressive atelectasis throughout most of the right lower lobe. Milder atelectasis left base. Nodular opacities on the right, likely metastatic foci.3.  Stable adenopathy compared to 1 week prior.4. Enlargement of spleen, incompletely visualized but documented CT 1 week prior. Concern for potential lymphoma. 5. Aortic atherosclerosis.  No aneurysm or dissection evident. Foci of coronary artery calcification noted."  Most recent lab results (03/06/2019) of CBC is as follows: all values are WNL except for Platelets at 72, Eosinophils Absolute at 0.6,Abs Immature Granulocytes at 0.08 .  On review of systems, pt reports rashes, digestive issues and denies back pain, abdominal pain and any other symptoms.   On PMHx the pt reports Hypothyroidism, MCI, Hypercholesterolemia, Tracheal anomaly, Hypertension, Pulmonary nodule, Atherosclerosis of aorta, anxiety, gait disorder, primary insomnia.   INTERVAL HISTORY:  Sarah [name] is a 86 y.o. female here for evaluation and management of Angioimmunoblastic T-cell lymphoma. She is here for C5D1 of A-AVD. We are joined by her daughter today. The patient's last visit with [place] was on 06/25/2019. The pt reports that she is doing well overall.  The pt reports that about a week after treatment she began to experience a sensation of warmth in her torso. Pt denies any recent fevers. She also had an extremely short episode of nausea which lasted for about 30 seconds, that was then followed by the feeling of nervousness and anxiety. She acknowledges that she may have some anxiety due to her daughter being gone for the last week. Pt has continued having bouts of nervousness and anxiety that last for about an hour. These episodes do not appear to be triggered by specific events and come on slowly. She also notices that her heart beats faster during these episodes. Pt takes 5 mg Ambien as prescribed and sleeps well throughout the night. Pt has been on 25 mg of Synthroid per day for many years and has not had any recent changes.   She drinks her morning shake and one Ensure with 30 mg of protein during the day. Pt does not each  much outside of those meals. The only thing that appetizes her is tomato soup.   She has noticed some blood in her stools intermittently. Pt is unsure if she has hemorrhoids.   Pt  has had both doses of her COVID19 vaccine and denies any concerns or symptomology.   Pt has been seen at the [facility] and her leg ulcer has been wrapped. Her daughter reports that her ulcer is about the same and that pt is beginning to develop a new ulcer in the area.   Lab results today (07/16/19) of CBC w/diff and CMP is as follows: all values are WNL except for RDW at 15.8, Lymphs Abs at 0.5K, Potassium at 3.4, Glucose at 104, AST at 11.  On review of systems, pt reports nervousness, anxiety, lack of appetite, dizziness, increased heart rate, bloody stools and denies tingling/numbness in hands/feet, new lumps/bumps, leg swelling, skin rashes and any other symptoms.   MEDICAL HISTORY:  Past Medical History:  Diagnosis Date  . Allergy    codeine, thiazides  . Anxiety    new dx  . Arthritis   . Breast cancer ([place]) 03/14/12   bx=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,ER/PR=+,upper inner quad  . Cancer ([place])    breast  . Cataract   . DVT (deep venous thrombosis) ([place]) 02/2019   left leg  . Dyspnea   . Edema    both legs feet and toe, abdomen  . Glaucoma    laser treated years ago  . HOH (hard of hearing)   . Hyperlipemia   . Hypertension   . Hypothyroidism   . Pancreatic cyst    benign  . PONV (postoperative nausea and vomiting)   . Pulmonary embolism ([place]) 02/2019   bilateral   . Radiation 06/11/2012-07/12/2012   17 sessions 4250 cGy, 3 sessions 750 cGy  . Vertigo   . Wears glasses   . Wears partial dentures    partial upper     SURGICAL HISTORY: Past Surgical History:  Procedure Laterality Date  . ABDOMINAL HYSTERECTOMY  1966   1/2 ovary left in   . BREAST SURGERY  1992   lumpectomy-lt  . CATARACT EXTRACTION     b/l  . COLONOSCOPY    . EXCISION MASS NECK Left 03/17/2019    EXCISION MASS NECK (Left Neck)  . EXCISION MASS NECK Left 03/17/2019   Procedure: EXCISION MASS NECK;  Surgeon: Helayne Seminole, MD;  Location: [facility];   Service: ENT;  Laterality: Left;  . EYE SURGERY Bilateral    bilateral cataract removal  . IR IMAGING GUIDED PORT INSERTION  04/23/2019  . JOINT REPLACEMENT  2013   rt total knee  . JOINT REPLACEMENT  1995   lt total knee  . PARTIAL MASTECTOMY WITH NEEDLE LOCALIZATION  04/09/2012   Procedure: PARTIAL MASTECTOMY WITH NEEDLE LOCALIZATION;  Surgeon: Adin Hector, MD;  Location: [facility];  Service: General;  Laterality: Right;  . SKIN BIOPSY Left 03/17/2019   LEFT THIGH  . SKIN BIOPSY Left 03/17/2019   Procedure: Skin Biopsy Left Thigh;  Surgeon: Helayne Seminole, MD;  Location: [facility];  Service: ENT;  Laterality: Left;  . TONSILLECTOMY    . TOTAL KNEE ARTHROPLASTY  08/16/2011   Procedure: TOTAL KNEE ARTHROPLASTY;  Surgeon: Ninetta Lights, MD;  Location: [facility];  Service: Orthopedics;  Laterality: Right;     SOCIAL HISTORY: Social History   Socioeconomic History  . Marital status: Widowed    Spouse  name: Not on file  . Number of children: 2  . Years of education: Not on file  . Highest education level: Not on file  Occupational History  . Occupation: Retired    Comment: Community education officer   Tobacco Use  . Smoking status: Never Smoker  . Smokeless tobacco: Never Used  Substance and Sexual Activity  . Alcohol use: No  . Drug use: No  . Sexual activity: Not Currently  Other Topics Concern  . Not on file  Social History Narrative  . Not on file   Social Determinants of Health   Financial Resource Strain:   . Difficulty of Paying Living Expenses: Not on file  Food Insecurity:   . Worried About Charity fundraiser in the Last Year: Not on file  . Ran Out of Food in the Last Year: Not on file  Transportation Needs:   . Lack of Transportation (Medical): Not on file  . Lack of Transportation (Non-Medical): Not on file  Physical Activity:   . Days of Exercise per Week: Not on file  . Minutes of Exercise per Session: Not on file  Stress:   . Feeling of  Stress : Not on file  Social Connections:   . Frequency of Communication with Friends and Family: Not on file  . Frequency of Social Gatherings with Friends and Family: Not on file  . Attends Religious Services: Not on file  . Active Member of Clubs or Organizations: Not on file  . Attends Archivist Meetings: Not on file  . Marital Status: Not on file  Intimate Partner Violence:   . Fear of Current or Ex-Partner: Not on file  . Emotionally Abused: Not on file  . Physically Abused: Not on file  . Sexually Abused: Not on file     FAMILY HISTORY: Family History  Problem Relation Age of Onset  . Heart disease Father   . Cancer Maternal Aunt        stomach     ALLERGIES:   is allergic to codeine; latex; and thiazide-type diuretics.   MEDICATIONS:  Current Outpatient Medications  Medication Sig Dispense Refill  . amLODipine (NORVASC) 2.5 MG tablet Take 2.5-5 mg by mouth See admin instructions. 5 mg in the morning, 2.5 mg in the evening    . apixaban (ELIQUIS) 5 MG TABS tablet Take 1 tablet (5 mg total) by mouth 2 (two) times daily. 180 tablet 0  . Cholecalciferol (VITAMIN D) 50 MCG (2000 UT) tablet Take 2,000 Units by mouth 2 (two) times daily.    . Cyanocobalamin (VITAMIN B-12 PO) Take 3,000 mcg by mouth daily.     Marland Kitchen levothyroxine (SYNTHROID) 25 MCG tablet Take 25 mcg by mouth daily before breakfast.     . lidocaine-prilocaine (EMLA) cream Apply to affected area once 30 g 3  . Multiple Vitamins-Minerals (PRESERVISION AREDS PO) Take 1 capsule by mouth 2 (two) times daily.     . ondansetron (ZOFRAN) 8 MG tablet Take 1 tablet (8 mg total) by mouth 2 (two) times daily as needed. Start on the third day after chemotherapy. 30 tablet 1  . polyethylene glycol (MIRALAX / GLYCOLAX) 17 g packet Take 17 g by mouth daily.    . predniSONE (DELTASONE) 50 MG tablet Take 50 mg tablet po daily for 5 days starting day after treatment 5 tablet 4  . prochlorperazine (COMPAZINE) 10 MG  tablet Take 1 tablet (10 mg total) by mouth every 6 (six) hours as needed (Nausea or  vomiting). 30 tablet 1  . zolpidem (AMBIEN) 10 MG tablet Take 2.5 mg by mouth at bedtime.     . Calcium Carbonate (CALCIUM 600 PO) Take 600 mg by mouth daily.    . cetirizine (ZYRTEC) 10 MG tablet Take 10 mg by mouth daily.     . Cyanocobalamin (B-12 PO) Take by mouth.    . erythromycin ophthalmic ointment SMARTSIG:1 Sparingly Right Eye 4 Times Daily    . fluticasone furoate-vilanterol (BREO ELLIPTA) 100-25 MCG/INH AEPB Inhale 1 puff into the lungs daily. (Patient not taking: Reported on 04/25/2019) 60 each 0  . gabapentin (NEURONTIN) 100 MG capsule Take 2 capsules (200 mg total) by mouth at bedtime. (Patient not taking: Reported on 07/16/2019) 60 capsule 2  . mupirocin ointment (BACTROBAN) 2 % Place 1 application into the nose 2 (two) times daily. 22 g 0  . triamcinolone cream (KENALOG) 0.1 % Apply 1 application topically 3 (three) times daily.     . vitamin E 400 UNIT capsule Take by mouth.     No current facility-administered medications for this visit.   Facility-Administered Medications Ordered in Other Visits  Medication Dose Route Frequency Provider Last Rate Last Admin  . sodium chloride flush (NS) 0.9 % injection 10 mL  10 mL Intracatheter PRN Brunetta Genera, MD   10 mL at 07/16/19 1309     REVIEW OF SYSTEMS:   A 10+ POINT REVIEW OF SYSTEMS WAS OBTAINED including neurology, dermatology, psychiatry, cardiac, respiratory, lymph, extremities, GI, GU, Musculoskeletal, constitutional, breasts, reproductive, HEENT.  All pertinent positives are noted in the HPI.  All others are negative.   PHYSICAL EXAMINATION: ECOG FS:1 - Symptomatic but completely ambulatory  Vitals:   07/16/19 0944  BP: (!) 143/76  Pulse: 75  Resp: 18  Temp: 97.8 F (36.6 C)  SpO2: 100%   Wt Readings from Last 3 Encounters:  07/16/19 110 lb 6.4 oz (50.1 kg)  06/25/19 113 lb 4.8 oz (51.4 kg)  06/04/19 112 lb (50.8 kg)    Body mass index is 18.95 kg/m.    Exam was given in a chair   GENERAL:alert, in no acute distress and comfortable SKIN: no acute rashes, no significant lesions EYES: conjunctiva are pink and non-injected, sclera anicteric OROPHARYNX: MMM, no exudates, no oropharyngeal erythema or ulceration NECK: supple, no JVD LYMPH:  no palpable lymphadenopathy in the cervical, axillary or inguinal regions LUNGS: clear to auscultation b/l with normal respiratory effort HEART: regular rate & rhythm ABDOMEN:  normoactive bowel sounds , non tender, not distended. No palpable hepatosplenomegaly.  Extremity: no pedal edema, ulcer on mid lower left leg is wrapped  PSYCH: alert & oriented x 3 with fluent speech NEURO: no focal motor/sensory deficits  LABORATORY DATA:  I have reviewed the data as listed  CBC Latest Ref Rng & Units 07/16/2019 06/25/2019 06/04/2019  WBC 4.0 - 10.5 K/uL 7.1 7.9 6.9  Hemoglobin 12.0 - 15.0 g/dL 12.9 12.6 12.2  Hematocrit 36.0 - 46.0 % 39.6 39.0 37.4  Platelets 150 - 400 K/uL 209 223 248    CMP Latest Ref Rng & Units 07/16/2019 06/25/2019 06/04/2019  Glucose 70 - 99 mg/dL 104(H) 78 104(H)  BUN 8 - 23 mg/dL 22 25(H) 26(H)  Creatinine 0.44 - 1.00 mg/dL 0.71 0.70 0.71  Sodium 135 - 145 mmol/L 144 142 143  Potassium 3.5 - 5.1 mmol/L 3.4(L) 4.1 3.8  Chloride 98 - 111 mmol/L 107 106 106  CO2 22 - 32 mmol/L 27 27 27   Calcium 8.9 -  10.3 mg/dL 9.4 9.2 9.4  Total Protein 6.5 - 8.1 g/dL 6.6 6.7 6.5  Total Bilirubin 0.3 - 1.2 mg/dL 0.6 0.5 0.5  Alkaline Phos 38 - 126 U/L 85 87 85  AST 15 - 41 U/L 11(L) 11(L) 14(L)  ALT 0 - 44 U/L 9 9 8    Component     Latest Ref Rng & Units 03/27/2019  Hepatitis B Surface Ag     NON REACTIVE NON REACTIVE  Hep B Core Total Ab     NON REACTIVE NON REACTIVE  HCV Ab     NON REACTIVE NON REACTIVE  LDH     98 - 192 U/L 252 (H)     04/08/2019 NM PET Image Initial (PI) Skull Base To Thigh (Accession [id number])  04/04/2019 PATHOLOGY    04/07/2019 ECHOCARDIOGRAM  03/28/2019 PATHOLOGY   03/06/2019 CT ANGIOGRAPHY CHEST WITH CONTRAST (Accession [id number])  04/07/2019 ECHOCARDIOGRAM  03/06/2019 ECHOCARDIOGRAM     03/06/2019 PORTABLE CHEST 1 VIEW (Accession [id number])    02/27/2019 CT CHEST, ABDOMEN, AND PELVIS WITH CONTRAST (Accession [id number])    02/17/2019 CHEST XRAY - 2 VIEW (Accession [id number])    RADIOGRAPHIC STUDIES: I have personally reviewed the radiological images as listed and agreed with the findings in the report. No results found.   ASSESSMENT & PLAN:   NAILANI FULL is a 86 y.o. female with:  1. 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 SKIN, LEFT THIGH, BIOPSY: - Involvement by angioimmunoblastic T-cell lymphoma." -04/07/2019 Echocardiogram - normal EF -NM PET Image Initial (PI) Skull Base To Thigh (Accession [id number]) completed on 04/08/2019 with results revealing "1. Widespread hypermetabolic adenopathy in the neck, chest, and abdomen/pelvis, primarily in the Deauville 4 and Deauville 5 range. There is also hypermetabolic activity along the posterior nasopharynx in lingual tonsillar regions potentially indicating sites of involvement. 2. The subtle hypodensities in the spine and bony pelvis are not appreciably hypermetabolic may be incidental, surveillance is suggested. 3. Bilateral thyroid activity but especially on the left. Probably from thyroiditis. 4. A 6 mm subpleural nodule anteriorly in the right lower lobe not appreciably hypermetabolic but is below sensitive PET-CT size thresholds and merits surveillance. 5. Mildly accentuated diffuse splenic activity without splenomegaly or focal splenic lesion identified. 6. Other imaging findings of potential clinical significance: Aortic Atherosclerosis (ICD10-I70.0). Coronary atherosclerosis. Large  right and small left pleural effusions. Moderate cardiomegaly. Small pericardial effusion. Mild mesenteric edema. Large left renal cyst. Fullness of both renal collecting systems with suspected nonobstructive left nephrolithiasis. Degenerative grade 1 anterolisthesis at L4-5 at L5-S1. Trace pelvic ascites."  05/28/2019 PET/CT ([id number]) revealed "Complete metabolic response to therapy".   2. Pulmonary Embolism -extensive .likely related to extensive malignancy  3. H/o Breast Cancer  -The patient had bilateral diagnostic  mammography at [facility] 07/11/2011. This  showed some calcifications in the right  breast, which seemed a little bit more  prominent than prior. In the left breast  there was an area of possible  architectural distortion. However left  breast ultrasound the same day showed  no abnormality. 6 month followup was  suggested, and on 02/28/2012 the  patient again had bilateral diagnostic  mammography, now with right  ultrasonography. The microcalcifications  in the right breast appeared increased.  Ultrasound showed a hypoechoic lesion  measuring 5 mm, with no associated  shadowing. This had been previously  noted and appeared unchanged. Anterior  to that there was a small  hypoechoic  mass measuring 4 mm in diameter.  There was felt to be suspicious, and on  03/14/2012 the patient underwent biopsy  of the right breast mass, showing ([id number]  [id number]) ductal carcinoma in situ,  intermediate grade, estrogen receptor  100% and progesterone receptor 100%  positive.   -Bilateral breast MRI was obtained  03/26/2012. This showed only post  biopsy changes in the right breast,  associated with an area of non-masslike  enhancement measuring 2.4 cm. The  left breast was unremarkable, and there  was no enlarged axillary or internal  mammary adenopathy noted.  Accordingly on 04/09/2012 the patient  underwent right lumpectomy, the  pathology ([id number]) showing ductal  carcinoma in situ measuring 2.0 cm,   grade 2, with negative margins, the  closest being 0.3 cm. The patient's  subsequent history is as detailed below.   PLAN: -Discussed pt labwork today, 07/16/19; blood counts and chemistries are holding steady   -Advised pt that using Prednisone over time could contribute to her anxious/jittery feeling  -Will lower Prednisone dose from 50 mg to 30 mg to help prevent anxiety -Will give pt Lorazepam to use as needed  -Discussed the potential for confusion in elderly patients when taking Lorazepam. Recommend pt take in the presence of others for the first dose.  -Recommend pt increase her food intake -The pt has no prohibitive toxicities from continuing C5D1 of Brentuximab at this time -Will complete C5 and C6 with Brentuximab alone. Will discontinue chemotherapy at this time. -Will discontinue Neulasta  -Advised pt that the discontinuation of chemotherapy and reduction of steroids would help in her ulcer's healing process.  -Recommend pt continue to elevate her legs and wear compression socks -Will continue to monitor LLE ulcer  -Recommend pt f/u with [facility] for LLE ulcer management -Continue 2000IU Cholecalciferol BID  -Rx low-dose Lorazepam, Prednisone  -Will get an EKG and TSH level today  -Will return to clinic in 3 weeks with labs    FOLLOW-UP: Plz schedule C6 of treatment with port flush, labs and MD visit    The total time spent in the appt was 30 minutes and more than 50% was on counseling and direct patient cares.  All of the patient's questions were answered with apparent satisfaction. The patient knows to call the clinic with any problems, questions or concerns.    Sullivan Lone MD [place] [facility]IV[facility] Hematology/Oncology Physician [facility]  (Office):       [phone] (Work cell):  [phone] (Fax):           [phone]  07/16/2019 3:42 PM  I, Yevette Edwards, am acting as a scribe for Dr. Sullivan Lone.   .I have reviewed the above  documentation for accuracy and completeness, and I agree with the above. Brunetta Genera MD

## 2019-07-16 NOTE — Patient Instructions (Signed)
[facility] Discharge Instructions for Patients Receiving Chemotherapy  Today you received the following chemotherapy agents:  brentuximab vedotin (ADETRIS)  To help prevent nausea and vomiting after your treatment, we encourage you to take your nausea medication as prescribed.   If you develop nausea and vomiting that is not controlled by your nausea medication, call the clinic.   BELOW ARE SYMPTOMS THAT SHOULD BE REPORTED IMMEDIATELY:  *FEVER GREATER THAN 100.5 F  *CHILLS WITH OR WITHOUT FEVER  NAUSEA AND VOMITING THAT IS NOT CONTROLLED WITH YOUR NAUSEA MEDICATION  *UNUSUAL SHORTNESS OF BREATH  *UNUSUAL BRUISING OR BLEEDING  TENDERNESS IN MOUTH AND THROAT WITH OR WITHOUT PRESENCE OF ULCERS  *URINARY PROBLEMS  *BOWEL PROBLEMS  UNUSUAL RASH Items with * indicate a potential emergency and should be followed up as soon as possible.  Feel free to call the clinic should you have any questions or concerns. The clinic phone number is [phone].  Please show the [place] at check-in to the Emergency Department and triage nurse.

## 2019-07-17 ENCOUNTER — Ambulatory Visit (INDEPENDENT_AMBULATORY_CARE_PROVIDER_SITE_OTHER): Payer: Medicare Other

## 2019-07-17 ENCOUNTER — Telehealth: Payer: Self-pay | Admitting: *Deleted

## 2019-07-17 DIAGNOSIS — J90 Pleural effusion, not elsewhere classified: Secondary | ICD-10-CM | POA: Diagnosis not present

## 2019-07-17 DIAGNOSIS — R911 Solitary pulmonary nodule: Secondary | ICD-10-CM | POA: Diagnosis not present

## 2019-07-17 MED ORDER — LORAZEPAM 0.5 MG PO TABS: 0.2500 mg | ORAL_TABLET | Freq: Three times a day (TID) | ORAL | 0 refills | Status: DC

## 2019-07-17 MED ORDER — PREDNISONE 10 MG PO TABS: 30.0000 mg | ORAL_TABLET | Freq: Every day | ORAL | 1 refills | Status: DC

## 2019-07-17 NOTE — Telephone Encounter (Signed)
Called and spoke to pt's daughter, [name]. She is questioning when pt needs a CXR. Per Dr. Ainsley Spinner note, she needs it now. Advised [name] of this. She is aware to bring pt as soon as they are able and Dr. Carlis Abbott will call with results. [name] verbalized understanding and denied any further questions or concerns at this time.

## 2019-07-17 NOTE — Telephone Encounter (Signed)
Daughter Sarah [name] called - thought Dr. Irene Limbo was prescribing meds (prednisone 30 mg/low dose lorazepam) for patient yesterday. Not at pharmacy this morning, but thought pt was to start prednisone today. Dr. Irene Limbo informed.  Medications sent to pharmacy this morning. Per Dr. Irene Limbo, patient can begin prednisone tomorrow Contacted daughter with information.

## 2019-07-17 NOTE — Progress Notes (Signed)
Sarah [name], Sarah [name] ([id number]) Visit Report for 07/14/2019 HPI Details Patient Name: Date of Service: Sarah [name], Sarah [name] 07/14/2019 1:30 PM Medical Record [id number] Patient Account Number: [id number] Date of Birth/Sex: Treating RN: [birth year] (86 y.o. Sarah [name] Primary Care Provider: Patrina [name] Other Clinician: Referring Provider: Treating Provider/Extender:Sarah [name], Sarah [name], Sarah [name] in Treatment: 8 History of Present Illness HPI Description: ADMISSION 05/19/2019 This is a an 86 year old woman who is referred from her oncologist SarahKale for review of a wound on her left anterior mid tibial area. although there are certain parts of the notes that accompany her and also in [place] link that suggests she had a wound on the right anterior mid tibia, the patient is fairly insistent that the current wound is the only wound she is ever had. She relates that this began as a small area on her left anterior mid tibia sometime in August i.e. clearly before the diagnosis or treatment of her lymphoma began. She said that has been gradually increasing in size over time she is completely unaware of any other precipitating factors such as trauma etc. She does have what looks to be changes of chronic venous insufficiency in her legs with some degree of hemosiderin deposition and edema but has never had problems with this before. The wound itself is exceptionally painful there is no drainage she has been using Bactroban on this twice a day with the help of her daughter. She was given a course of doxycycline last week by Sarah [name] which she is tolerating well She also in September developed a widespread rash that was pruritic. She was seen by her primary doctor and dermatologist I believe. Ultimately she had a CT scan in early October that documented widespread adenopathy with splenomegaly.Marland Kitchen She was hospitalized from 03/06/2019 through 03/08/2019 presenting with shortness  of breath leg swelling on the left. She was diagnosed with a pulmonary embolism with a DVT in the left leg this predominantly involve the left posterior tibial veins and the left gastrocnemius veins. She is on Eliquis since then. The patient was ultimately diagnosed with angioimmunoblastic T-cell lymphoma and she has been on chemotherapy including Adriamycin Cytoxan and brentuximab. She was receiving a course of this every 3 [name]. She stated that the first round of chemotherapy caused her rash to go away. 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 So far she is tolerating her chemotherapy well although this will stretch out into the beginning of March 2021 The patient's major complaint is the pain she has burning pain even at night that sometimes awakens her from sleep. Past medical history; lymphoma as noted above, breast CA with a lumpectomy in 2013, right total knee replacement, hypothyroidism, pulmonary embolism as described, DVT in the left leg also as described. ABI in our clinic was 1.26 on the left 1/11; patient arrives with the surface of this wound completely covered and adherent eschar. Her daughter was able to show me pictures on her cell phone that suggest that this was getting better however it certainly does not look like that today. She will not let me even get close to this for debridement. Even washing the surface of the wound off seems to be on her. 1/25; 2-week follow-up. In general the wound on the left anterior tibia looks better although there is still surface debris under illumination. There is no ability to do mechanical debridement on this the patient simply cannot stand it. Even washing this off with Anasept and gauze  is prohibitive in terms of discomfort. We have been using Santyl and for this foreseeable future that we will need to continue. 2/8; 2-week follow-up. Unfortunately this does not look any better at all. Very tight  adherent debris. She barely will let me get even close to this for examination let alone debridement. We have been using Santyl without effect. The exact cause of this wound is not clear. She had chemotherapy for underlying lymphoma although the wound seems to have occurred before this. I change the primary dressing to Iodoflex with border foam and using her own stocking for edema control. The edema looks better today 2/22; 2-week follow-up. Slightly improved but not in terms of surface area. She did not tolerate the Iodoflex for the next week and she went back to the [place] but she has been using it for most of this past week. She has been using her compression stocking Electronic Signature(s) Signed: 07/14/2019 5:55:43 PM By: Linton Ham MD Entered By: Linton Ham on 07/14/2019 14:34:21 -------------------------------------------------------------------------------- Physical Exam Details Patient Name: Date of Service: Sarah [name] 07/14/2019 1:30 PM Medical Record [id number] Patient Account Number: [id number] Date of Birth/Sex: Treating RN: Sep 22, 1932 (86 y.o. Alaira [name] Primary Care Provider: Patrina [name] Other Clinician: Referring Provider: Treating Provider/Extender:Sarah [name], Sarah [name], Sarah [name] in Treatment: 8 Cardiovascular Pedal pulses are palpable there. Notes Wound exam; the patient's wound is essentially the same in terms of surface area some better control of the fibrinous debris over the surface with the Iodoflex. She has a satellite region just superior to the other wound which is new. She will not allow mechanical debridement. There is no evidence of infection around the wound Electronic Signature(s) Signed: 07/14/2019 5:55:43 PM By: Linton Ham MD Entered By: Linton Ham on 07/14/2019 14:35:28 -------------------------------------------------------------------------------- Physician Orders Details Patient Name: Date of  Service: Sarah [name] 07/14/2019 1:30 PM Medical Record [id number] Patient Account Number: [id number] Date of Birth/Sex: Treating RN: [birth year] (86 y.o. Sarah [name] Primary Care Provider: Lajean Manes T Other Clinician: Referring Provider: Treating Provider/Extender:Billijo Dilling, Sarah [name], Sarah [name] in Treatment: 8 Verbal / Phone Orders: No Diagnosis Coding ICD-10 Coding Code Description [id number] Non-pressure chronic ulcer of other part of left lower leg with other specified severity I87.312 Chronic venous hypertension (idiopathic) with ulcer of left lower extremity C86.5 Angioimmunoblastic T-cell lymphoma Follow-up Appointments Return Appointment in 1 week. Dressing Change Frequency Wound #1 Left,Anterior Lower Leg Do not change entire dressing for one week. Wound #2 Left,Lateral Lower Leg Do not change entire dressing for one week. Skin Barriers/Peri-Wound Care Barrier cream Moisturizing lotion Wound Cleansing May shower with protection. Primary Wound Dressing Wound #1 Left,Anterior Lower Leg Iodoflex Wound #2 Left,Lateral Lower Leg Iodoflex Secondary Dressing Wound #1 Left,Anterior Lower Leg Dry Gauze ABD pad Wound #2 Left,Lateral Lower Leg Dry Gauze ABD pad Edema Control 3 Layer Compression System - Left Lower Extremity Avoid standing for long periods of time Elevate legs to the level of the heart or above for 30 minutes daily and/or when sitting, a frequency of: - throughout the day Electronic Signature(s) Signed: 07/14/2019 5:55:43 PM By: Linton Ham MD Signed: 07/17/2019 8:56:01 AM By: Levan Hurst RN, BSN Entered By: Levan Hurst on 07/14/2019 14:31:01 -------------------------------------------------------------------------------- Problem List Details Patient Name: Date of Service: Sarah [name] 07/14/2019 1:30 PM Medical Record [id number] Patient Account Number: [id number] Date of Birth/Sex: Treating  RN: January 24, 1933 (86 y.o. Maite [name] Primary Care Provider: Patrina [name] Other Clinician: Referring Provider: Treating Provider/Extender:Michayla Mcneil, Sarah [name],  Sarah [name] in Treatment: 8 Active Problems ICD-10 Evaluated Encounter Code Description Active Date Today Diagnosis L97.828 Non-pressure chronic ulcer of other part of left lower 05/19/2019 No Yes leg with other specified severity I87.312 Chronic venous hypertension (idiopathic) with ulcer of 05/19/2019 No Yes left lower extremity C86.5 Angioimmunoblastic T-cell lymphoma 05/19/2019 No Yes Inactive Problems Resolved Problems Electronic Signature(s) Signed: 07/14/2019 5:55:43 PM By: Linton Ham MD Entered By: Linton Ham on 07/14/2019 14:33:40 -------------------------------------------------------------------------------- Progress Note Details Patient Name: Date of Service: Sarah [name] 07/14/2019 1:30 PM Medical Record [id number] Patient Account Number: [id number] Date of Birth/Sex: Treating RN: [birth year] (86 y.o. Leonela [name] Primary Care Provider: Patrina [name] Other Clinician: Referring Provider: Treating Provider/Extender:Jaysen Wey, Sarah [name], Sarah [name] in Treatment: 8 Subjective History of Present Illness (HPI) ADMISSION 05/19/2019 This is a an 86 year old woman who is referred from her oncologist SarahKale for review of a wound on her left anterior mid tibial area. although there are certain parts of the notes that accompany her and also in [place] link that suggests she had a wound on the right anterior mid tibia, the patient is fairly insistent that the current wound is the only wound she is ever had. She relates that this began as a small area on her left anterior mid tibia sometime in August i.e. clearly before the diagnosis or treatment of her lymphoma began. She said that has been gradually increasing in size over time she is completely unaware of any other  precipitating factors such as trauma etc. She does have what looks to be changes of chronic venous insufficiency in her legs with some degree of hemosiderin deposition and edema but has never had problems with this before. The wound itself is exceptionally painful there is no drainage she has been using Bactroban on this twice a day with the help of her daughter. She was given a course of doxycycline last week by Sarah [name] which she is tolerating well She also in September developed a widespread rash that was pruritic. She was seen by her primary doctor and dermatologist I believe. Ultimately she had a CT scan in early October that documented widespread adenopathy with splenomegaly.Marland Kitchen She was hospitalized from 03/06/2019 through 03/08/2019 presenting with shortness of breath leg swelling on the left. She was diagnosed with a pulmonary embolism with a DVT in the left leg this predominantly involve the left posterior tibial veins and the left gastrocnemius veins. She is on Eliquis since then. The patient was ultimately diagnosed with angioimmunoblastic T-cell lymphoma and she has been on chemotherapy including Adriamycin Cytoxan and brentuximab. She was receiving a course of this every 3 [name]. She stated that the first round of chemotherapy caused her rash to go away. 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 So far she is tolerating her chemotherapy well although this will stretch out into the beginning of March 2021 The patient's major complaint is the pain she has burning pain even at night that sometimes awakens her from sleep. Past medical history; lymphoma as noted above, breast CA with a lumpectomy in 2013, right total knee replacement, hypothyroidism, pulmonary embolism as described, DVT in the left leg also as described. ABI in our clinic was 1.26 on the left 1/11; patient arrives with the surface of this wound completely covered and adherent eschar. Her  daughter was able to show me pictures on her cell phone that suggest that this was getting better however it certainly does not look like that  today. She will not let me even get close to this for debridement. Even washing the surface of the wound off seems to be on her. 1/25; 2-week follow-up. In general the wound on the left anterior tibia looks better although there is still surface debris under illumination. There is no ability to do mechanical debridement on this the patient simply cannot stand it. Even washing this off with Anasept and gauze is prohibitive in terms of discomfort. We have been using Santyl and for this foreseeable future that we will need to continue. 2/8; 2-week follow-up. Unfortunately this does not look any better at all. Very tight adherent debris. She barely will let me get even close to this for examination let alone debridement. We have been using Santyl without effect. The exact cause of this wound is not clear. She had chemotherapy for underlying lymphoma although the wound seems to have occurred before this. I change the primary dressing to Iodoflex with border foam and using her own stocking for edema control. The edema looks better today 2/22; 2-week follow-up. Slightly improved but not in terms of surface area. She did not tolerate the Iodoflex for the next week and she went back to the [place] but she has been using it for most of this past week. She has been using her compression stocking Objective Constitutional Vitals Time Taken: 1:35 PM, Height: 64 in, Weight: 111 lbs, BMI: 19.1, Temperature: 97.9 F, Pulse: 79 bpm, Respiratory Rate: 18 breaths/min, Blood Pressure: 126/74 mmHg. Cardiovascular Pedal pulses are palpable there. General Notes: Wound exam; the patient's wound is essentially the same in terms of surface area some better control of the fibrinous debris over the surface with the Iodoflex. She has a satellite region just superior to the  other wound which is new. She will not allow mechanical debridement. There is no evidence of infection around the wound Integumentary (Hair, Skin) Wound #1 status is Open. Original cause of wound was Gradually Appeared. The wound is located on the Left,Anterior Lower Leg. The wound measures 2.2cm length x 1.3cm width x 0.2cm depth; 2.246cm^2 area and 0.449cm^3 volume. There is Fat Layer (Subcutaneous Tissue) Exposed exposed. There is no tunneling or undermining noted. There is a medium amount of serosanguineous drainage noted. The wound margin is flat and intact. There is medium (34-66%) pink granulation within the wound bed. There is a medium (34-66%) amount of necrotic tissue within the wound bed including Adherent Slough. Wound #2 status is Open. Original cause of wound was Gradually Appeared. The wound is located on the Left,Lateral Lower Leg. The wound measures 0.4cm length x 0.5cm width x 0.1cm depth; 0.157cm^2 area and 0.016cm^3 volume. There is no tunneling or undermining noted. There is a medium amount of serosanguineous drainage noted. The wound margin is distinct with the outline attached to the wound base. There is no granulation within the wound bed. There is a large (67-100%) amount of necrotic tissue within the wound bed including Eschar and Adherent Slough. Assessment Active Problems ICD-10 Non-pressure chronic ulcer of other part of left lower leg with other specified severity Chronic venous hypertension (idiopathic) with ulcer of left lower extremity Angioimmunoblastic T-cell lymphoma Procedures Wound #1 Pre-procedure diagnosis of Wound #1 is a Venous Leg Ulcer located on the Left,Anterior Lower Leg . There was a Three Layer Compression Therapy Procedure by Levan Hurst, RN. Post procedure Diagnosis Wound #1: Same as Pre-Procedure Wound #2 Pre-procedure diagnosis of Wound #2 is a Venous Leg Ulcer located on the Left,Lateral Lower Leg .  There was a Three Layer  Compression Therapy Procedure by Levan Hurst, RN. Post procedure Diagnosis Wound #2: Same as Pre-Procedure Plan Follow-up Appointments: Return Appointment in 1 week. Dressing Change Frequency: Wound #1 Left,Anterior Lower Leg: Do not change entire dressing for one week. Wound #2 Left,Lateral Lower Leg: Do not change entire dressing for one week. Skin Barriers/Peri-Wound Care: Barrier cream Moisturizing lotion Wound Cleansing: May shower with protection. Primary Wound Dressing: Wound #1 Left,Anterior Lower Leg: Iodoflex Wound #2 Left,Lateral Lower Leg: Iodoflex Secondary Dressing: Wound #1 Left,Anterior Lower Leg: Dry Gauze ABD pad Wound #2 Left,Lateral Lower Leg: Dry Gauze ABD pad Edema Control: 3 Layer Compression System - Left Lower Extremity Avoid standing for long periods of time Elevate legs to the level of the heart or above for 30 minutes daily and/or when sitting, a frequency of: - throughout the day 1. I am continue with the Iodoflex as the surface of the wound slightly better than with the Santyl. Ideally I would like mechanical debridement 2. I could not exactly rule out doing a biopsy on this although she will not let me get anywhere near this Electronic Signature(s) Signed: 07/14/2019 5:55:43 PM By: Linton Ham MD Entered By: Linton Ham on 07/14/2019 14:36:18 -------------------------------------------------------------------------------- SuperBill Details Patient Name: Date of Service: Sarah [name] 07/14/2019 Medical Record [id number] Patient Account Number: [id number] Date of Birth/Sex: Treating RN: 11/12/1932 (86 y.o. Chante [name] Primary Care Provider: Lajean Manes T Other Clinician: Referring Provider: Treating Provider/Extender:Hy Swiatek, Sarah [name], Sarah [name] in Treatment: 8 Diagnosis Coding ICD-10 Codes Code Description [phone] Non-pressure chronic ulcer of other part of left lower leg with other specified  severity I87.312 Chronic venous hypertension (idiopathic) with ulcer of left lower extremity C86.5 Angioimmunoblastic T-cell lymphoma Facility Procedures CPT4 Code Description: [id number] (Facility Use Only) [phone] - APPLY [place] LWR LT LEG Modifier: Quantity: 1 Physician Procedures CPT4 Code Description: [id number] - WC PHYS LEVEL 3 - EST PT ICD-10 Diagnosis Description L97.828 Non-pressure chronic ulcer of other part of left lower le severity I87.312 Chronic venous hypertension (idiopathic) with ulcer of le C86.5  Angioimmunoblastic T-cell lymphoma Modifier: g with other spe ft lower extremi Quantity: 1 cified ty Electronic Signature(s) Signed: 07/14/2019 5:55:43 PM By: Linton Ham MD Entered By: Linton Ham on 07/14/2019 14:36:38

## 2019-07-17 NOTE — Progress Notes (Signed)
Please let [name] or her daughter Sarah [name] know that her CXR looks great- no pleural effusion. Thanks!

## 2019-07-18 ENCOUNTER — Ambulatory Visit: Payer: Medicare Other

## 2019-07-18 NOTE — Telephone Encounter (Signed)
Spoke with Sarah [name] will forward this to her to do a test run to see if what the rep from [employer] gave [place] works.

## 2019-07-21 ENCOUNTER — Encounter (HOSPITAL_BASED_OUTPATIENT_CLINIC_OR_DEPARTMENT_OTHER): Payer: Medicare Other | Attending: Internal Medicine | Admitting: Internal Medicine

## 2019-07-21 ENCOUNTER — Other Ambulatory Visit: Payer: Self-pay

## 2019-07-21 DIAGNOSIS — Z86711 Personal history of pulmonary embolism: Secondary | ICD-10-CM | POA: Diagnosis not present

## 2019-07-21 DIAGNOSIS — L97828 Non-pressure chronic ulcer of other part of left lower leg with other specified severity: Secondary | ICD-10-CM | POA: Insufficient documentation

## 2019-07-21 DIAGNOSIS — L97822 Non-pressure chronic ulcer of other part of left lower leg with fat layer exposed: Secondary | ICD-10-CM | POA: Diagnosis not present

## 2019-07-21 DIAGNOSIS — S81802A Unspecified open wound, left lower leg, initial encounter: Secondary | ICD-10-CM | POA: Diagnosis not present

## 2019-07-21 DIAGNOSIS — Z853 Personal history of malignant neoplasm of breast: Secondary | ICD-10-CM | POA: Diagnosis not present

## 2019-07-21 DIAGNOSIS — I87312 Chronic venous hypertension (idiopathic) with ulcer of left lower extremity: Secondary | ICD-10-CM | POA: Diagnosis not present

## 2019-07-21 DIAGNOSIS — C865 Angioimmunoblastic T-cell lymphoma: Secondary | ICD-10-CM | POA: Insufficient documentation

## 2019-07-21 DIAGNOSIS — Z96651 Presence of right artificial knee joint: Secondary | ICD-10-CM | POA: Diagnosis not present

## 2019-07-21 DIAGNOSIS — Z7901 Long term (current) use of anticoagulants: Secondary | ICD-10-CM | POA: Insufficient documentation

## 2019-07-21 DIAGNOSIS — E039 Hypothyroidism, unspecified: Secondary | ICD-10-CM | POA: Insufficient documentation

## 2019-07-21 DIAGNOSIS — I10 Essential (primary) hypertension: Secondary | ICD-10-CM | POA: Insufficient documentation

## 2019-07-21 NOTE — Progress Notes (Signed)
Sarah, [name] ([id number]) Visit Report for 07/21/2019 HPI Details Patient Name: Date of Service: Sarah, [name] 07/21/2019 2:45 PM Medical Record [id number] Patient Account Number: [id number] Date of Birth/Sex: Treating RN: [birth year] (86 y.o. Sarah [name] Primary Care Provider: Patrina Levering Other Clinician: Referring Provider: Treating Provider/Extender:Ralston Venus, [name], Hal T Weeks in Treatment: 9 History of Present Illness HPI Description: ADMISSION 05/19/2019 This is a an 86 year old woman who is referred from her oncologist Dr.Kale for review of a wound on her left anterior mid tibial area. although there are certain parts of the notes that accompany her and also in [place] link that suggests she had a wound on the right anterior mid tibia, the patient is fairly insistent that the current wound is the only wound she is ever had. She relates that this began as a small area on her left anterior mid tibia sometime in August i.e. clearly before the diagnosis or treatment of her lymphoma began. She said that has been gradually increasing in size over time she is completely unaware of any other precipitating factors such as trauma etc. She does have what looks to be changes of chronic venous insufficiency in her legs with some degree of hemosiderin deposition and edema but has never had problems with this before. The wound itself is exceptionally painful there is no drainage she has been using Bactroban on this twice a day with the help of her daughter. She was given a course of doxycycline last week by Dr. Donzetta Matters which she is tolerating well She also in September developed a widespread rash that was pruritic. She was seen by her primary doctor and dermatologist I believe. Ultimately she had a CT scan in early October that documented widespread adenopathy with splenomegaly.Marland Kitchen She was hospitalized from 03/06/2019 through 03/08/2019 presenting with shortness of  breath leg swelling on the left. She was diagnosed with a pulmonary embolism with a DVT in the left leg this predominantly involve the left posterior tibial veins and the left gastrocnemius veins. She is on Eliquis since then. The patient was ultimately diagnosed with angioimmunoblastic T-cell lymphoma and she has been on chemotherapy including Adriamycin Cytoxan and brentuximab. She was receiving a course of this every 3 weeks. She stated that the first round of chemotherapy caused her rash to go away. 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 So far she is tolerating her chemotherapy well although this will stretch out into the beginning of March 2021 The patient's major complaint is the pain she has burning pain even at night that sometimes awakens her from sleep. Past medical history; lymphoma as noted above, breast CA with a lumpectomy in 2013, right total knee replacement, hypothyroidism, pulmonary embolism as described, DVT in the left leg also as described. ABI in our clinic was 1.26 on the left 1/11; patient arrives with the surface of this wound completely covered and adherent eschar. Her daughter was able to show me pictures on her cell phone that suggest that this was getting better however it certainly does not look like that today. She will not let me even get close to this for debridement. Even washing the surface of the wound off seems to be on her. 1/25; 2-week follow-up. In general the wound on the left anterior tibia looks better although there is still surface debris under illumination. There is no ability to do mechanical debridement on this the patient simply cannot stand it. Even washing this off with Anasept and gauze  is prohibitive in terms of discomfort. We have been using Santyl and for this foreseeable future that we will need to continue. 2/8; 2-week follow-up. Unfortunately this does not look any better at all. Very tight adherent  debris. She barely will let me get even close to this for examination let alone debridement. We have been using Santyl without effect. The exact cause of this wound is not clear. She had chemotherapy for underlying lymphoma although the wound seems to have occurred before this. I change the primary dressing to Iodoflex with border foam and using her own stocking for edema control. The edema looks better today 2/22; 2-week follow-up. Slightly improved but not in terms of surface area. She did not tolerate the Iodoflex for the next week and she went back to the [place] but she has been using it for most of this past week. She has been using her compression stocking 3/1; we are using Iodoflex under wraps. I think her wound actually looks some better although there is still debris here. She tells [place] her lymphoma is in remission which is great news. She is on a short-term course of high-dose prednisone obviously not wonderful in terms of wound healing Electronic Signature(s) Signed: 07/21/2019 5:45:55 PM By: Linton Ham MD Entered By: Linton Ham on 07/21/2019 16:23:56 -------------------------------------------------------------------------------- Physical Exam Details Patient Name: Date of Service: [name] 07/21/2019 2:45 PM Medical Record [id number] Patient Account Number: [id number] Date of Birth/Sex: Treating RN: [birth year] (86 y.o. Sarah [name] Primary Care Provider: Patrina Levering Other Clinician: Referring Provider: Treating Provider/Extender:Kanchan Gal, [name], Hal T Weeks in Treatment: 9 Constitutional Sitting or standing Blood Pressure is within target range for patient.. Pulse regular and within target range for patient.Marland Kitchen Respirations regular, non-labored and within target range.. Temperature is normal and within the target range for the patient.Marland Kitchen Appears in no distress. Cardiovascular Pedal pulses palpable. Edema is controlled. Integumentary  (Hair, Skin) No erythema around the wound no suggesting of. Notes Wound exam; there has been some improvement in general. She still has a very fibrinous debris over the surface some exposed granulation. The satellite lesion from last time is closed over. We have not been doing mechanical debridement at the patient's instructions although I try to wash this off with Anasept and gauze each time she was here as vigorously as she would allow. Electronic Signature(s) Signed: 07/21/2019 5:45:55 PM By: Linton Ham MD Entered By: Linton Ham on 07/21/2019 16:25:11 -------------------------------------------------------------------------------- Physician Orders Details Patient Name: Date of Service: [name] 07/21/2019 2:45 PM Medical Record [id number] Patient Account Number: [id number] Date of Birth/Sex: Treating RN: 01-19-1933 (86 y.o. Sarah [name] Primary Care Provider: Lajean Manes T Other Clinician: Referring Provider: Treating Provider/Extender:Alyissa Whidbee, [name], Hal T Weeks in Treatment: 9 Verbal / Phone Orders: No Diagnosis Coding ICD-10 Coding Code Description [phone] Non-pressure chronic ulcer of other part of left lower leg with other specified severity I87.312 Chronic venous hypertension (idiopathic) with ulcer of left lower extremity C86.5 Angioimmunoblastic T-cell lymphoma Follow-up Appointments Return Appointment in 1 week. Dressing Change Frequency Wound #1 Left,Anterior Lower Leg Do not change entire dressing for one week. Skin Barriers/Peri-Wound Care Barrier cream Moisturizing lotion Wound Cleansing Wound #1 Left,Anterior Lower Leg May shower with protection. Primary Wound Dressing Wound #1 Left,Anterior Lower Leg Iodoflex Secondary Dressing Wound #1 Left,Anterior Lower Leg Dry Gauze ABD pad Edema Control 3 Layer Compression System - Left Lower Extremity Avoid standing for long periods of time Elevate legs to the level of  the heart  or above for 30 minutes daily and/or when sitting, a frequency of: - throughout the day Electronic Signature(s) Signed: 07/21/2019 5:45:55 PM By: Linton Ham MD Signed: 07/21/2019 5:57:14 PM By: Levan Hurst RN, BSN Entered By: Levan Hurst on 07/21/2019 16:17:44 -------------------------------------------------------------------------------- Problem List Details Patient Name: Date of Service: [name] 07/21/2019 2:45 PM Medical Record [id number] Patient Account Number: [id number] Date of Birth/Sex: Treating RN: Oct 13, 1932 (86 y.o. Sarah [name] Primary Care Provider: Patrina Levering Other Clinician: Referring Provider: Treating Provider/Extender:Harlem Bula, [name], Junius Creamer in Treatment: 9 Active Problems ICD-10 Evaluated Encounter Code Description Active Date Today Diagnosis L97.828 Non-pressure chronic ulcer of other part of left lower 05/19/2019 No Yes leg with other specified severity I87.312 Chronic venous hypertension (idiopathic) with ulcer of 05/19/2019 No Yes left lower extremity C86.5 Angioimmunoblastic T-cell lymphoma 05/19/2019 No Yes Inactive Problems Resolved Problems Electronic Signature(s) Signed: 07/21/2019 5:45:55 PM By: Linton Ham MD Entered By: Linton Ham on 07/21/2019 16:22:40 -------------------------------------------------------------------------------- Progress Note Details Patient Name: Date of Service: [name] 07/21/2019 2:45 PM Medical Record [id number] Patient Account Number: [id number] Date of Birth/Sex: Treating RN: 09-30-1932 (86 y.o. Sarah [name] Primary Care Provider: Patrina Levering Other Clinician: Referring Provider: Treating Provider/Extender:Fread Kottke, [name], Hal T Weeks in Treatment: 9 Subjective History of Present Illness (HPI) ADMISSION 05/19/2019 This is a an 86 year old woman who is referred from her oncologist Dr.Kale for review of a wound on  her left anterior mid tibial area. although there are certain parts of the notes that accompany her and also in [place] link that suggests she had a wound on the right anterior mid tibia, the patient is fairly insistent that the current wound is the only wound she is ever had. She relates that this began as a small area on her left anterior mid tibia sometime in August i.e. clearly before the diagnosis or treatment of her lymphoma began. She said that has been gradually increasing in size over time she is completely unaware of any other precipitating factors such as trauma etc. She does have what looks to be changes of chronic venous insufficiency in her legs with some degree of hemosiderin deposition and edema but has never had problems with this before. The wound itself is exceptionally painful there is no drainage she has been using Bactroban on this twice a day with the help of her daughter. She was given a course of doxycycline last week by Dr. Donzetta Matters which she is tolerating well She also in September developed a widespread rash that was pruritic. She was seen by her primary doctor and dermatologist I believe. Ultimately she had a CT scan in early October that documented widespread adenopathy with splenomegaly.Marland Kitchen She was hospitalized from 03/06/2019 through 03/08/2019 presenting with shortness of breath leg swelling on the left. She was diagnosed with a pulmonary embolism with a DVT in the left leg this predominantly involve the left posterior tibial veins and the left gastrocnemius veins. She is on Eliquis since then. The patient was ultimately diagnosed with angioimmunoblastic T-cell lymphoma and she has been on chemotherapy including Adriamycin Cytoxan and brentuximab. She was receiving a course of this every 3 weeks. She stated that the first round of chemotherapy caused her rash to go away. 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 So far she is  tolerating her chemotherapy well although this will stretch out into the beginning of March 2021 The patient's major complaint is the pain she has burning pain even  at night that sometimes awakens her from sleep. Past medical history; lymphoma as noted above, breast CA with a lumpectomy in 2013, right total knee replacement, hypothyroidism, pulmonary embolism as described, DVT in the left leg also as described. ABI in our clinic was 1.26 on the left 1/11; patient arrives with the surface of this wound completely covered and adherent eschar. Her daughter was able to show me pictures on her cell phone that suggest that this was getting better however it certainly does not look like that today. She will not let me even get close to this for debridement. Even washing the surface of the wound off seems to be on her. 1/25; 2-week follow-up. In general the wound on the left anterior tibia looks better although there is still surface debris under illumination. There is no ability to do mechanical debridement on this the patient simply cannot stand it. Even washing this off with Anasept and gauze is prohibitive in terms of discomfort. We have been using Santyl and for this foreseeable future that we will need to continue. 2/8; 2-week follow-up. Unfortunately this does not look any better at all. Very tight adherent debris. She barely will let me get even close to this for examination let alone debridement. We have been using Santyl without effect. The exact cause of this wound is not clear. She had chemotherapy for underlying lymphoma although the wound seems to have occurred before this. I change the primary dressing to Iodoflex with border foam and using her own stocking for edema control. The edema looks better today 2/22; 2-week follow-up. Slightly improved but not in terms of surface area. She did not tolerate the Iodoflex for the next week and she went back to the [place] but she has been using it  for most of this past week. She has been using her compression stocking 3/1; we are using Iodoflex under wraps. I think her wound actually looks some better although there is still debris here. She tells [place] her lymphoma is in remission which is great news. She is on a short-term course of high-dose prednisone obviously not wonderful in terms of wound healing Objective Constitutional Sitting or standing Blood Pressure is within target range for patient.. Pulse regular and within target range for patient.Marland Kitchen Respirations regular, non-labored and within target range.. Temperature is normal and within the target range for the patient.Marland Kitchen Appears in no distress. Vitals Time Taken: 3:06 PM, Height: 64 in, Weight: 111 lbs, BMI: 19.1, Temperature: 97.8 F, Pulse: 76 bpm, Respiratory Rate: 18 breaths/min, Blood Pressure: 132/64 mmHg. Cardiovascular Pedal pulses palpable. Edema is controlled. General Notes: Wound exam; there has been some improvement in general. She still has a very fibrinous debris over the surface some exposed granulation. The satellite lesion from last time is closed over. We have not been doing mechanical debridement at the patient's instructions although I try to wash this off with Anasept and gauze each time she was here as vigorously as she would allow. Integumentary (Hair, Skin) No erythema around the wound no suggesting of. Wound #1 status is Open. Original cause of wound was Gradually Appeared. The wound is located on the Left,Anterior Lower Leg. The wound measures 2cm length x 1.4cm width x 0.1cm depth; 2.199cm^2 area and 0.22cm^3 volume. There is Fat Layer (Subcutaneous Tissue) Exposed exposed. There is no tunneling or undermining noted. There is a medium amount of serosanguineous drainage noted. The wound margin is flat and intact. There is medium (34-66%) pink granulation within the wound bed. There  is a medium (34-66%) amount of necrotic tissue within the wound bed  including Adherent Slough. Wound #2 status is Healed - Epithelialized. Original cause of wound was Gradually Appeared. The wound is located on the Left,Lateral Lower Leg. The wound measures 0cm length x 0cm width x 0cm depth; 0cm^2 area and 0cm^3 volume. There is no tunneling or undermining noted. There is a none present amount of drainage noted. The wound margin is distinct with the outline attached to the wound base. There is no granulation within the wound bed. There is no necrotic tissue within the wound bed. Assessment Active Problems ICD-10 Non-pressure chronic ulcer of other part of left lower leg with other specified severity Chronic venous hypertension (idiopathic) with ulcer of left lower extremity Angioimmunoblastic T-cell lymphoma Procedures Wound #1 Pre-procedure diagnosis of Wound #1 is a Venous Leg Ulcer located on the Left,Anterior Lower Leg . There was a Three Layer Compression Therapy Procedure by Levan Hurst, RN. Post procedure Diagnosis Wound #1: Same as Pre-Procedure Plan Follow-up Appointments: Return Appointment in 1 week. Dressing Change Frequency: Wound #1 Left,Anterior Lower Leg: Do not change entire dressing for one week. Skin Barriers/Peri-Wound Care: Barrier cream Moisturizing lotion Wound Cleansing: Wound #1 Left,Anterior Lower Leg: May shower with protection. Primary Wound Dressing: Wound #1 Left,Anterior Lower Leg: Iodoflex Secondary Dressing: Wound #1 Left,Anterior Lower Leg: Dry Gauze ABD pad Edema Control: 3 Layer Compression System - Left Lower Extremity Avoid standing for long periods of time Elevate legs to the level of the heart or above for 30 minutes daily and/or when sitting, a frequency of: - throughout the day 1. Continue with Iodoflex is the primary dressing which seems to be helping the wound surface/ABDs/3 layer compression 2. She will be in in a week. Her daughter asked if she could take the dressing off before she comes  next week and give the patient a shower on I think that is a reasonable suggestion Electronic Signature(s) Signed: 07/21/2019 5:45:55 PM By: Linton Ham MD Entered By: Linton Ham on 07/21/2019 16:26:02 -------------------------------------------------------------------------------- SuperBill Details Patient Name: Date of Service: [name] 07/21/2019 Medical Record [id number] Patient Account Number: [id number] Date of Birth/Sex: Treating RN: 04-May-1933 (86 y.o. Sarah [name] Primary Care Provider: Lajean Manes T Other Clinician: Referring Provider: Treating Provider/Extender:Grettel Rames, [name], Hal T Weeks in Treatment: 9 Diagnosis Coding ICD-10 Codes Code Description [phone] Non-pressure chronic ulcer of other part of left lower leg with other specified severity I87.312 Chronic venous hypertension (idiopathic) with ulcer of left lower extremity C86.5 Angioimmunoblastic T-cell lymphoma Facility Procedures CPT4 Code Description: [id number] (Facility Use Only) [phone] - APPLY MULTLAY COMPRS LWR LT LEG Modifier: Quantity: 1 Physician Procedures CPT4 Code Description: [id number] - WC PHYS LEVEL 3 - EST PT ICD-10 Diagnosis Description L97.828 Non-pressure chronic ulcer of other part of left lower leg w severity I87.312 Chronic venous hypertension (idiopathic) with ulcer of left Modifier: ith other specifie lower extremity Quantity: 1 d Electronic Signature(s) Signed: 07/21/2019 5:45:55 PM By: Linton Ham MD Entered By: Linton Ham on 07/21/2019 16:26:19

## 2019-07-21 NOTE — Progress Notes (Addendum)
LAYAAN, Sarah [name] ([id number]) Visit Report for 07/21/2019 Arrival Information Details Patient Name: Date of Service: Sarah [name], Sarah [name] 07/21/2019 2:45 PM Medical Record [id number] Patient Account Number: [id number] Date of Birth/Sex: Treating RN: [birth year] (86 y.o. [name] Primary Care Azariah Latendresse: Lajean Manes T Other Clinician: Referring Jarah Pember: Treating Kyaire Gruenewald/Extender:Robson, [name], Hal T Weeks in Treatment: 9 Visit Information History Since Last Visit Added or deleted any medications: No Patient Arrived: Ambulatory Any new allergies or adverse reactions: No Arrival Time: 15:05 Had a fall or experienced change in No Accompanied By: daughter activities of daily living that may affect Transfer Assistance: None risk of falls: Patient Identification Verified: Yes Signs or symptoms of abuse/neglect since last No Secondary Verification Process Completed: Yes visito Patient Requires Transmission-Based No Hospitalized since last visit: No Precautions: Implantable device outside of the clinic excluding No Patient Has Alerts: No cellular tissue based products placed in the center since last visit: Has Dressing in Place as Prescribed: Yes Has Compression in Place as Prescribed: Yes Pain Present Now: Yes Electronic Signature(s) Signed: 07/21/2019 5:21:05 PM By: Kela Millin Entered By: Kela Millin on 07/21/2019 15:05:59 -------------------------------------------------------------------------------- Compression Therapy Details Patient Name: Date of Service: Sarah [name] 07/21/2019 2:45 PM Medical Record [id number] Patient Account Number: [id number] Date of Birth/Sex: Treating RN: August 18, 1932 (86 y.o. Marili Fetter Primary Care Sahid Borba: Patrina Levering Other Clinician: Referring Cayenne Breault: Treating Analucia Hush/Extender:Robson, [name], Hal T Weeks in Treatment: 9 Compression Therapy Performed for Wound Wound #1  Left,Anterior Lower Leg Assessment: Performed By: Clinician Levan Hurst, RN Compression Type: Three Layer Post Procedure Diagnosis Same as Pre-procedure Electronic Signature(s) Signed: 07/21/2019 5:57:14 PM By: Levan Hurst RN, BSN Entered By: Levan Hurst on 07/21/2019 16:19:44 -------------------------------------------------------------------------------- Encounter Discharge Information Details Patient Name: Date of Service: Sarah [name] 07/21/2019 2:45 PM Medical Record [id number] Patient Account Number: [id number] Date of Birth/Sex: Treating RN: 03-Oct-1932 (86 y.o. [name] Primary Care Leighton Brickley: Patrina Levering Other Clinician: Referring Sahar Ryback: Treating Eutha Cude/Extender:Robson, [name], Junius Creamer in Treatment: 9 Encounter Discharge Information Items Discharge Condition: Stable Ambulatory Status: Ambulatory Discharge Destination: Home Transportation: Private Auto Accompanied By: daughter Schedule Follow-up Appointment: Yes Clinical Summary of Care: Electronic Signature(s) Signed: 07/21/2019 5:15:23 PM By: Deon Pilling Entered By: Deon Pilling on 07/21/2019 16:33:35 -------------------------------------------------------------------------------- Lower Extremity Assessment Details Patient Name: Date of Service: Sarah [name], Sarah [name] 07/21/2019 2:45 PM Medical Record [id number] Patient Account Number: [id number] Date of Birth/Sex: Treating RN: 08-26-1932 (86 y.o. [name] Primary Care Giang Hemme: Lajean Manes T Other Clinician: Referring Breianna Delfino: Treating Cortina Vultaggio/Extender:Robson, [name], Hal T Weeks in Treatment: 9 Edema Assessment Assessed: [Left: No] [Right: No] Edema: [Left: Ye] [Right: s] Calf Left: Right: Point of Measurement: 36 cm From Medial Instep 27 cm cm Ankle Left: Right: Point of Measurement: 9 cm From Medial Instep 19 cm cm Vascular Assessment Pulses: Dorsalis Pedis Palpable:  [Left:Yes] Electronic Signature(s) Signed: 07/21/2019 5:21:05 PM By: Kela Millin Entered By: Kela Millin on 07/21/2019 15:09:25 -------------------------------------------------------------------------------- Multi Wound Chart Details Patient Name: Date of Service: Sarah [name] 07/21/2019 2:45 PM Medical Record [id number] Patient Account Number: [id number] Date of Birth/Sex: Treating RN: [birth year] (86 y.o. Lisabeth Fetter Primary Care Antavius Sperbeck: Patrina Levering Other Clinician: Referring Khaleel Beckom: Treating Leigh Kaeding/Extender:Robson, [name], Hal T Weeks in Treatment: 9 Vital Signs Height(in): 64 Pulse(bpm): 76 Weight(lbs): 111 Blood Pressure(mmHg): 132/64 Body Mass Index(BMI): 19 Temperature(F): 97.8 Respiratory 18 Rate(breaths/min): Photos: [1:No Photos] [2:No Photos] [N/A:N/A] Wound Location: [1:Left Lower Leg - Anterior] [2:Left Lower Leg - Lateral] [  N/A:N/A] Wounding Event: [1:Gradually Appeared] [2:Gradually Appeared] [N/A:N/A] Primary Etiology: [1:Venous Leg Ulcer] [2:Venous Leg Ulcer] [N/A:N/A] Comorbid History: [1:Hypertension, Received Chemotherapy, Received Radiation] [2:Hypertension, Received Chemotherapy, Received Radiation] [N/A:N/A] Date Acquired: [1:12/21/2018] [2:07/07/2019] [N/A:N/A] Weeks of Treatment: [1:9] [2:1] [N/A:N/A] Wound Status: [1:Open] [2:Healed - Epithelialized] [N/A:N/A] Measurements L x W x D 2x1.4x0.1 [2:0x0x0] [N/A:N/A] (cm) Area (cm) : [1:2.199] [2:0] [N/A:N/A] Volume (cm) : [1:0.22] [2:0] [N/A:N/A] % Reduction in Area: [1:22.20%] [2:100.00%] [N/A:N/A] % Reduction in Volume: 74.10% [2:100.00%] [N/A:N/A] Classification: [1:Full Thickness Without Exposed Support Structures Exposed Support Structures] [2:Full Thickness Without] [N/A:N/A] Exudate Amount: [1:Medium] [2:None Present] [N/A:N/A] Exudate Type: [1:Serosanguineous] [2:N/A] [N/A:N/A] Exudate Color: [1:red, brown] [2:N/A] [N/A:N/A] Wound Margin:  [1:Flat and Intact] [2:Distinct, outline attached N/A] Granulation Amount: [1:Medium (34-66%)] [2:None Present (0%)] [N/A:N/A] Granulation Quality: [1:Pink] [2:N/A] [N/A:N/A] Necrotic Amount: [1:Medium (34-66%)] [2:None Present (0%)] [N/A:N/A] Exposed Structures: [1:Fat Layer (Subcutaneous Fascia: No Tissue) Exposed: Yes Fascia: No Tendon: No Muscle: No Joint: No Bone: No] [2:Fat Layer (Subcutaneous Tissue) Exposed: No Tendon: No Muscle: No Joint: No Bone: No] [N/A:N/A] Epithelialization: [1:Small (1-33%) Compression Therapy] [2:Large (67-100%) N/A] [N/A:N/A N/A] Treatment Notes Electronic Signature(s) Signed: 07/21/2019 5:45:55 PM By: Linton Ham MD Signed: 07/21/2019 5:57:14 PM By: Levan Hurst RN, BSN Entered By: Linton Ham on 07/21/2019 16:22:47 -------------------------------------------------------------------------------- Multi-Disciplinary Care Plan Details Patient Name: Date of Service: Sarah [name] 07/21/2019 2:45 PM Medical Record [id number] Patient Account Number: [id number] Date of Birth/Sex: Treating RN: [birth year] (86 y.o. Yarisbel Fetter Primary Care Casondra Gasca: Patrina Levering Other Clinician: Referring Meleana Commerford: Treating Aliese Brannum/Extender:Robson, [name], Hal T Weeks in Treatment: 9 Active Inactive Venous Leg Ulcer Nursing Diagnoses: Knowledge deficit related to disease process and management Potential for venous Insuffiency (use before diagnosis confirmed) Goals: Patient will maintain optimal edema control Date Initiated: 05/19/2019 Target Resolution Date: 08/15/2019 Goal Status: Active Patient/caregiver will verbalize understanding of disease process and disease management Date Initiated: 05/19/2019 Date Inactivated: 06/16/2019 Target Resolution Date: 06/20/2019 Goal Status: Met Interventions: Assess peripheral edema status every visit. Provide education on venous insufficiency Notes: Wound/Skin Impairment Nursing  Diagnoses: Impaired tissue integrity Knowledge deficit related to ulceration/compromised skin integrity Goals: Patient/caregiver will verbalize understanding of skin care regimen Date Initiated: 05/19/2019 Target Resolution Date: 08/15/2019 Goal Status: Active Ulcer/skin breakdown will have a volume reduction of 30% by week 4 Date Initiated: 05/19/2019 Date Inactivated: 06/16/2019 Target Resolution Date: 06/20/2019 Goal Status: Met Ulcer/skin breakdown will have a volume reduction of 50% by week 8 Date Initiated: 06/16/2019 Date Inactivated: 07/14/2019 Target Resolution Date: 07/18/2019 Unmet Reason: necrotic Goal Status: Unmet surface, pt will not allow sharp debridement Interventions: Assess patient/caregiver ability to obtain necessary supplies Assess patient/caregiver ability to perform ulcer/skin care regimen upon admission and as needed Assess ulceration(s) every visit Provide education on ulcer and skin care Notes: Electronic Signature(s) Signed: 07/21/2019 5:57:14 PM By: Levan Hurst RN, BSN Entered By: Levan Hurst on 07/21/2019 16:20:01 -------------------------------------------------------------------------------- Pain Assessment Details Patient Name: Date of Service: Sarah [name] 07/21/2019 2:45 PM Medical Record [id number] Patient Account Number: [id number] Date of Birth/Sex: Treating RN: 1933-03-18 (86 y.o. [name] Primary Care Lianne Carreto: Patrina Levering Other Clinician: Referring Duha Abair: Treating Trinidee Schrag/Extender:Robson, [name], Hal T Weeks in Treatment: 9 Active Problems Location of Pain Severity and Description of Pain Patient Has Paino Yes Site Locations Pain Location: Pain in Ulcers With Dressing Change: Yes Duration of the Pain. Constant / Intermittento Constant Rate the pain. Current Pain Level: 1 Worst Pain Level: 4 Least Pain Level: 1 Tolerable Pain Level: 3 Character  of Pain Describe the Pain: Burning Pain  Management and Medication Current Pain Management: Electronic Signature(s) Signed: 07/21/2019 5:21:05 PM By: Kela Millin Entered By: Kela Millin on 07/21/2019 15:08:39 -------------------------------------------------------------------------------- Patient/Caregiver Education Details Patient Name: Date of Service: Sarah [name] 3/1/2021andnbsp2:45 PM Medical Record [id number] Patient Account Number: [id number] Date of Birth/Gender: Treating RN: 1932/12/23 (86 y.o. Myrlene Fetter Primary Care Physician: Patrina Levering Other Clinician: Referring Physician: Treating Physician/Extender:Robson, [name], Junius Creamer in Treatment: 9 Education Assessment Education Provided To: Patient Education Topics Provided Venous: Methods: Explain/Verbal Responses: State content correctly Wound/Skin Impairment: Methods: Explain/Verbal Responses: State content correctly Electronic Signature(s) Signed: 07/21/2019 5:57:14 PM By: Levan Hurst RN, BSN Entered By: Levan Hurst on 07/21/2019 16:20:13 -------------------------------------------------------------------------------- Wound Assessment Details Patient Name: Date of Service: Sarah [name] 07/21/2019 2:45 PM Medical Record [id number] Patient Account Number: [id number] Date of Birth/Sex: Treating RN: Dec 27, 1932 (86 y.o. Elya Fetter Primary Care Catia Todorov: Lajean Manes T Other Clinician: Referring Lorin Hauck: Treating Zalen Sequeira/Extender:Robson, [name], Hal T Weeks in Treatment: 9 Wound Status Wound Number: 1 Primary Venous Leg Ulcer Etiology: Wound Location: Left Lower Leg - Anterior Wound Open Wounding Event: Gradually Appeared Status: Date Acquired: 12/21/2018 Comorbid Hypertension, Received Chemotherapy, Weeks Of Treatment: 9 History: Received Radiation Clustered Wound: No Photos Wound Measurements Length: (cm) 2 % Reduct Width: (cm) 1.4 % Reduct Depth: (cm) 0.1  Epitheli Area: (cm) 2.199 Tunneli Volume: (cm) 0.22 Undermi Wound Description Classification: Full Thickness Without Exposed Support Foul Odo Structures Slough/F Wound Flat and Intact Margin: Exudate Medium Amount: Exudate Serosanguineous Type: Exudate red, brown Color: Wound Bed Granulation Amount: Medium (34-66%) Granulation Quality: Pink Fascia E Necrotic Amount: Medium (34-66%) Fat Laye Necrotic Quality: Adherent Slough Tendon E Muscle E Joint Ex Bone Exp r After Cleansing: No ibrino Yes Exposed Structure xposed: No r (Subcutaneous Tissue) Exposed: Yes xposed: No xposed: No posed: No osed: No ion in Area: 22.2% ion in Volume: 74.1% alization: Small (1-33%) ng: No ning: No Electronic Signature(s) Signed: 07/25/2019 4:01:29 PM By: Mikeal Hawthorne EMT/HBOT Signed: 09/03/2019 9:05:01 AM By: Levan Hurst RN, BSN Previous Signature: 07/21/2019 5:21:05 PM Version By: Kela Millin Entered By: Mikeal Hawthorne on 07/25/2019 13:40:17 -------------------------------------------------------------------------------- Wound Assessment Details Patient Name: Date of Service: Sarah [name] 07/21/2019 2:45 PM Medical Record [id number] Patient Account Number: [id number] Date of Birth/Sex: Treating RN: 02/22/1933 (86 y.o. [place] Fetter Primary Care Sanja Elizardo: Lajean Manes T Other Clinician: Referring Axel Meas: Treating Valyn Latchford/Extender:Robson, [name], Hal T Weeks in Treatment: 9 Wound Status Wound Number: 2 Primary Venous Leg Ulcer Etiology: Wound Location: Left Lower Leg - Lateral Wound Healed - Epithelialized Wounding Event: Gradually Appeared Status: Date Acquired: 07/07/2019 Comorbid Hypertension, Received Chemotherapy, Weeks Of Treatment: 1 History: Received Radiation Clustered Wound: No Photos Wound Measurements Length: (cm) 0 % Reduct Width: (cm) 0 % Reduct Depth: (cm) 0 Epitheli Area: (cm) 0 Tunneli Volume: (cm) 0  Undermi Wound Description Full Thickness Without Exposed Support Foul Odo Classification: Structures Slough/F Wound Distinct, outline attached Margin: Exudate Exudate None Present Amount: Wound Bed Granulation Amount: None Present (0%) Necrotic Amount: None Present (0%) Fascia Ex Fat Layer Tendon Ex Muscle Ex Joint Exp Bone Expo r After Cleansing: No ibrino No Exposed Structure posed: No (Subcutaneous Tissue) Exposed: No posed: No posed: No osed: No sed: No ion in Area: 100% ion in Volume: 100% alization: Large (67-100%) ng: No ning: No Electronic Signature(s) Signed: 07/25/2019 4:01:29 PM By: Mikeal Hawthorne EMT/HBOT Signed: 09/03/2019 9:05:01 AM By: Levan Hurst RN, BSN Previous Signature: 07/21/2019  5:21:05 PM Version By: Kela Millin Entered By: Mikeal Hawthorne on 07/25/2019 13:39:59 -------------------------------------------------------------------------------- Vitals Details Patient Name: Date of Service: Sarah [name] 07/21/2019 2:45 PM Medical Record [id number] Patient Account Number: [id number] Date of Birth/Sex: Treating RN: 1932-06-13 (86 y.o. [name] Primary Care Shizue Kaseman: Lajean Manes T Other Clinician: Referring Betsy Rosello: Treating Christopherjohn Schiele/Extender:Robson, [name], Hal T Weeks in Treatment: 9 Vital Signs Time Taken: 15:06 Temperature (F): 97.8 Height (in): 64 Pulse (bpm): 76 Weight (lbs): 111 Respiratory Rate (breaths/min): 18 Body Mass Index (BMI): 19.1 Blood Pressure (mmHg): 132/64 Reference Range: 80 - 120 mg / dl Electronic Signature(s) Signed: 07/21/2019 5:21:05 PM By: Kela Millin Entered By: Kela Millin on 07/21/2019 15:08:17

## 2019-07-22 ENCOUNTER — Telehealth: Payer: Self-pay | Admitting: Hematology

## 2019-07-22 NOTE — Telephone Encounter (Signed)
Scheduled appts per 3/2 sch msg. Left voicemail on daughter's phone with appt details. Sent out a reminder letter and calender.

## 2019-07-25 NOTE — Telephone Encounter (Signed)
Mandi - please advise. Thanks!

## 2019-07-28 ENCOUNTER — Encounter (HOSPITAL_BASED_OUTPATIENT_CLINIC_OR_DEPARTMENT_OTHER): Payer: Medicare Other | Admitting: Internal Medicine

## 2019-07-28 ENCOUNTER — Other Ambulatory Visit: Payer: Self-pay

## 2019-07-28 DIAGNOSIS — Z7901 Long term (current) use of anticoagulants: Secondary | ICD-10-CM | POA: Diagnosis not present

## 2019-07-28 DIAGNOSIS — L97822 Non-pressure chronic ulcer of other part of left lower leg with fat layer exposed: Secondary | ICD-10-CM | POA: Diagnosis not present

## 2019-07-28 DIAGNOSIS — E039 Hypothyroidism, unspecified: Secondary | ICD-10-CM | POA: Diagnosis not present

## 2019-07-28 DIAGNOSIS — C865 Angioimmunoblastic T-cell lymphoma: Secondary | ICD-10-CM | POA: Diagnosis not present

## 2019-07-28 DIAGNOSIS — L97828 Non-pressure chronic ulcer of other part of left lower leg with other specified severity: Secondary | ICD-10-CM | POA: Diagnosis not present

## 2019-07-28 DIAGNOSIS — I10 Essential (primary) hypertension: Secondary | ICD-10-CM | POA: Diagnosis not present

## 2019-07-28 DIAGNOSIS — I87312 Chronic venous hypertension (idiopathic) with ulcer of left lower extremity: Secondary | ICD-10-CM | POA: Diagnosis not present

## 2019-07-28 NOTE — Progress Notes (Addendum)
Sarah [name], PENNY ([id number]) Visit Report for 07/28/2019 Arrival Information Details Patient Name: Date of Service: Sarah [name], DENES 07/28/2019 11:15 AM Medical Record [id number] Patient Account Number: [id number] Date of Birth/Sex: Treating RN: [birth year] (86 y.o. [name] Primary Care : Lajean Manes T Other Clinician: Referring : Treating /Extender:Robson, [name], Hal T Weeks in Treatment: 10 Visit Information History Since Last Visit Added or deleted any medications: No Patient Arrived: Ambulatory Any new allergies or adverse reactions: No Arrival Time: 11:55 Had a fall or experienced change in No Accompanied By: self activities of daily living that may affect Transfer Assistance: None risk of falls: Patient Identification Verified: Yes Signs or symptoms of abuse/neglect since last No Secondary Verification Process Completed: Yes visito Patient Requires Transmission-Based No Hospitalized since last visit: No Precautions: Implantable device outside of the clinic excluding No Patient Has Alerts: No cellular tissue based products placed in the center since last visit: Has Dressing in Place as Prescribed: Yes Pain Present Now: No Electronic Signature(s) Signed: 07/28/2019 5:31:36 PM By: Kela Millin Entered By: Kela Millin on 07/28/2019 11:55:51 -------------------------------------------------------------------------------- Compression Therapy Details Patient Name: Date of Service: Sarah [name] 07/28/2019 11:15 AM Medical Record [id number] Patient Account Number: [id number] Date of Birth/Sex: Treating RN: 1932/06/08 (86 y.o. Briteny Fetter Primary Care : Patrina Levering Other Clinician: Referring : Treating /Extender:Robson, [name], Hal T Weeks in Treatment: 10 Compression Therapy Performed for Wound Wound #1 Left,Anterior Lower  Leg Assessment: Performed By: Clinician Levan Hurst, RN Compression Type: Three Layer Post Procedure Diagnosis Same as Pre-procedure Electronic Signature(s) Signed: 07/28/2019 5:59:01 PM By: Levan Hurst RN, BSN Entered By: Levan Hurst on 07/28/2019 12:02:16 -------------------------------------------------------------------------------- Encounter Discharge Information Details Patient Name: Date of Service: Sarah [name] 07/28/2019 11:15 AM Medical Record [id number] Patient Account Number: [id number] Date of Birth/Sex: Treating RN: 1933-05-01 (86 y.o. [name] Primary Care : Patrina Levering Other Clinician: Referring : Treating /Extender:Robson, [name], Junius Creamer in Treatment: 10 Encounter Discharge Information Items Discharge Condition: Stable Ambulatory Status: Ambulatory Discharge Destination: Home Transportation: Private Auto Accompanied By: daughter Schedule Follow-up Appointment: Yes Clinical Summary of Care: Electronic Signature(s) Signed: 07/28/2019 1:02:56 PM By: Deon Pilling Entered By: Deon Pilling on 07/28/2019 12:09:49 -------------------------------------------------------------------------------- Lower Extremity Assessment Details Patient Name: Date of Service: Sarah [name], MINIER 07/28/2019 11:15 AM Medical Record [id number] Patient Account Number: [id number] Date of Birth/Sex: Treating RN: 03/10/1933 (86 y.o. [name] Primary Care : Lajean Manes T Other Clinician: Referring : Treating /Extender:Robson, [name], Hal T Weeks in Treatment: 10 Edema Assessment Assessed: [Left: No] [Right: No] Edema: [Left: Ye] [Right: s] Calf Left: Right: Point of Measurement: 36 cm From Medial Instep 27.5 cm cm Ankle Left: Right: Point of Measurement: 9 cm From Medial Instep 19 cm cm Vascular Assessment Pulses: Dorsalis Pedis Palpable:  [Left:Yes] Electronic Signature(s) Signed: 07/28/2019 5:31:36 PM By: Kela Millin Entered By: Kela Millin on 07/28/2019 11:56:51 -------------------------------------------------------------------------------- Multi Wound Chart Details Patient Name: Date of Service: Sarah [name] 07/28/2019 11:15 AM Medical Record [id number] Patient Account Number: [id number] Date of Birth/Sex: Treating RN: 01/20/1933 (86 y.o. Aveya Fetter Primary Care : Patrina Levering Other Clinician: Referring : Treating /Extender:Robson, [name], Hal T Weeks in Treatment: 10 Vital Signs Height(in): 64 Pulse(bpm): 75 Weight(lbs): 111 Blood Pressure(mmHg): 137/80 Body Mass Index(BMI): 19 Temperature(F): 98 Respiratory 18 Rate(breaths/min): Photos: [1:No Photos] [N/A:N/A] Wound Location: [1:Left, Anterior Lower Leg] [N/A:N/A] Wounding Event: [1:Gradually Appeared] [N/A:N/A] Primary Etiology: [1:Venous Leg Ulcer] [N/A:N/A] Comorbid History: [1:Hypertension,  Received Chemotherapy, Received Radiation] [N/A:N/A] Date Acquired: [1:12/21/2018] [N/A:N/A] Weeks of Treatment: [1:10] [N/A:N/A] Wound Status: [1:Open] [N/A:N/A] Measurements L x W x D 2x1.8x0.1 [N/A:N/A] (cm) Area (cm) : [1:2.827] [N/A:N/A] Volume (cm) : [1:0.283] [N/A:N/A] % Reduction in Area: [1:0.00%] [N/A:N/A] % Reduction in Volume: 66.60% [N/A:N/A] Classification: [1:Full Thickness Without Exposed Support Structures] [N/A:N/A] Exudate Amount: [1:Medium] [N/A:N/A] Exudate Type: [1:Serosanguineous] [N/A:N/A] Exudate Color: [1:red, brown] [N/A:N/A] Wound Margin: [1:Flat and Intact] [N/A:N/A] Granulation Amount: [1:Medium (34-66%)] [N/A:N/A] Granulation Quality: [1:Pink] [N/A:N/A] Necrotic Amount: [1:Medium (34-66%)] [N/A:N/A] Exposed Structures: [1:Fat Layer (Subcutaneous N/A Tissue) Exposed: Yes Fascia: No Tendon: No Muscle: No Joint: No Bone: No] Epithelialization: [1:Small (1-33%)  Compression Therapy] [N/A:N/A N/A] Treatment Notes Wound #1 (Left, Anterior Lower Leg) 1. Cleanse With Wound Cleanser Soap and water 2. Periwound Care Barrier cream Moisturizing lotion 3. Primary Dressing Applied Iodoflex 4. Secondary Dressing ABD Pad Dry Gauze 6. Support Layer Applied 3 layer compression wrap Notes netting Electronic Signature(s) Signed: 07/28/2019 5:29:15 PM By: Linton Ham MD Signed: 07/28/2019 5:59:01 PM By: Levan Hurst RN, BSN Entered By: Linton Ham on 07/28/2019 12:59:06 -------------------------------------------------------------------------------- Multi-Disciplinary Care Plan Details Patient Name: Date of Service: Sarah [name], BEDONIE 07/28/2019 11:15 AM Medical Record [id number] Patient Account Number: [id number] Date of Birth/Sex: Treating RN: [birth year] (86 y.o. Tiffani Fetter Primary Care Roen Macgowan: Patrina Levering Other Clinician: Referring Traven Davids: Treating Tonianne Fine/Extender:Robson, [name], Hal T Weeks in Treatment: 10 Active Inactive Venous Leg Ulcer Nursing Diagnoses: Knowledge deficit related to disease process and management Potential for venous Insuffiency (use before diagnosis confirmed) Goals: Patient will maintain optimal edema control Date Initiated: 05/19/2019 Target Resolution Date: 08/15/2019 Goal Status: Active Patient/caregiver will verbalize understanding of disease process and disease management Date Initiated: 05/19/2019 Date Inactivated: 06/16/2019 Target Resolution Date: 06/20/2019 Goal Status: Met Interventions: Assess peripheral edema status every visit. Provide education on venous insufficiency Notes: Wound/Skin Impairment Nursing Diagnoses: Impaired tissue integrity Knowledge deficit related to ulceration/compromised skin integrity Goals: Patient/caregiver will verbalize understanding of skin care regimen Date Initiated: 05/19/2019 Target Resolution Date: 08/15/2019 Goal Status:  Active Ulcer/skin breakdown will have a volume reduction of 30% by week 4 Date Initiated: 05/19/2019 Date Inactivated: 06/16/2019 Target Resolution Date: 06/20/2019 Goal Status: Met Ulcer/skin breakdown will have a volume reduction of 50% by week 8 Date Initiated: 06/16/2019 Date Inactivated: 07/14/2019 Target Resolution Date: 07/18/2019 Unmet Reason: necrotic Goal Status: Unmet surface, pt will not allow sharp debridement Interventions: Assess patient/caregiver ability to obtain necessary supplies Assess patient/caregiver ability to perform ulcer/skin care regimen upon admission and as needed Assess ulceration(s) every visit Provide education on ulcer and skin care Notes: Electronic Signature(s) Signed: 07/28/2019 5:59:01 PM By: Levan Hurst RN, BSN Entered By: Levan Hurst on 07/28/2019 17:39:04 -------------------------------------------------------------------------------- Pain Assessment Details Patient Name: Date of Service: Sarah [name] 07/28/2019 11:15 AM Medical Record [id number] Patient Account Number: [id number] Date of Birth/Sex: Treating RN: 1932-08-16 (86 y.o. [name] Primary Care Tanith Dagostino: Patrina Levering Other Clinician: Referring Keontay Vora: Treating Shavette Shoaff/Extender:Robson, [name], Hal T Weeks in Treatment: 10 Active Problems Location of Pain Severity and Description of Pain Patient Has Paino No Site Locations Pain Management and Medication Current Pain Management: Electronic Signature(s) Signed: 07/28/2019 5:31:36 PM By: Kela Millin Entered By: Kela Millin on 07/28/2019 11:56:42 -------------------------------------------------------------------------------- Patient/Caregiver Education Details Patient Name: Date of Service: Sarah [name] 3/8/2021andnbsp11:15 AM Medical Record [id number] Patient Account Number: [id number] Date of Birth/Gender: Treating RN: Mar 04, 1933 (86 y.o. Keah Fetter Primary  Care Physician: Patrina Levering Other Clinician: Referring Physician: Treating Physician/Extender:Robson,  [name], Adelfa Koh Weeks in Treatment: 10 Education Assessment Education Provided To: Patient Education Topics Provided Wound/Skin Impairment: Methods: Explain/Verbal Responses: State content correctly [employer]) Signed: 07/28/2019 5:59:01 PM By: Levan Hurst RN, BSN Entered By: Levan Hurst on 07/28/2019 17:39:13 -------------------------------------------------------------------------------- Wound Assessment Details Patient Name: Date of Service: Sarah [name] 07/28/2019 11:15 AM Medical Record [id number] Patient Account Number: [id number] Date of Birth/Sex: Treating RN: [birth year] (86 y.o. [name] Primary Care Marcia Lepera: Lajean Manes T Other Clinician: Referring Kemoni Ortega: Treating Avri Paiva/Extender:Robson, [name], Hal T Weeks in Treatment: 10 Wound Status Wound Number: 1 Primary Venous Leg Ulcer Etiology: Wound Location: Left Lower Leg - Anterior Wound Open Wounding Event: Gradually Appeared Status: Date Acquired: 12/21/2018 Comorbid Hypertension, Received Chemotherapy, Weeks Of Treatment: 10 History: Received Radiation Clustered Wound: No Photos Wound Measurements Length: (cm) 2 % Reduct Width: (cm) 1.8 % Reduct Depth: (cm) 0.1 Epitheli Area: (cm) 2.827 Tunneli Volume: (cm) 0.283 Undermi Wound Description Classification: Full Thickness Without Exposed Support Foul Od Structures Slough/ Wound Flat and Intact Margin: Exudate Medium Amount: Exudate Serosanguineous Type: Exudate red, brown Color: Wound Bed Granulation Amount: Medium (34-66%) Granulation Quality: Pink Fascia Ex Necrotic Amount: Medium (34-66%) Fat Layer Necrotic Quality: Adherent Slough Tendon Ex Muscle Ex Joint Exp Bone Expo or After Cleansing: No Fibrino Yes Exposed Structure posed: No (Subcutaneous Tissue) Exposed:  Yes posed: No posed: No osed: No sed: No ion in Area: 0% ion in Volume: 66.6% alization: Small (1-33%) ng: No ning: No Treatment Notes Wound #1 (Left, Anterior Lower Leg) 1. Cleanse With Wound Cleanser Soap and water 2. Periwound Care Barrier cream Moisturizing lotion 3. Primary Dressing Applied Iodoflex 4. Secondary Dressing ABD Pad Dry Gauze 6. Support Layer Applied 3 layer compression wrap Notes netting Electronic Signature(s) Signed: 07/29/2019 3:29:05 PM By: Mikeal Hawthorne EMT/HBOT Signed: 07/29/2019 5:46:17 PM By: Kela Millin Previous Signature: 07/28/2019 5:31:36 PM Version By: Kela Millin Entered By: Mikeal Hawthorne on 07/29/2019 14:04:58 -------------------------------------------------------------------------------- Vitals Details Patient Name: Date of Service: Sarah [name] 07/28/2019 11:15 AM Medical Record [id number] Patient Account Number: [id number] Date of Birth/Sex: Treating RN: 1933-02-12 (86 y.o. [name] Primary Care Virgina Deakins: Lajean Manes T Other Clinician: Referring Saria Haran: Treating Briseis Aguilera/Extender:Robson, [name], Hal T Weeks in Treatment: 10 Vital Signs Time Taken: 11:30 Temperature (F): 98 Height (in): 64 Pulse (bpm): 75 Weight (lbs): 111 Respiratory Rate (breaths/min): 18 Body Mass Index (BMI): 19.1 Blood Pressure (mmHg): 137/80 Reference Range: 80 - 120 mg / dl Electronic Signature(s) Signed: 07/28/2019 5:31:36 PM By: Kela Millin Entered By: Kela Millin on 07/28/2019 11:56:37

## 2019-07-28 NOTE — Telephone Encounter (Signed)
Will follow up with Dr. Carlis Abbott this week when she is in the office about this issue. Will sign off of this and will update if needed.

## 2019-07-29 NOTE — Progress Notes (Signed)
MAREA, REASNER ([id number]) Visit Report for 07/28/2019 HPI Details Patient Name: Date of Service: Sarah [name], Sarah [name] 07/28/2019 11:15 AM Medical Record [id number] Patient Account Number: [id number] Date of Birth/Sex: Treating RN: [birth year] (86 y.o. Sarah [name] Primary Care Provider: Patrina [name] Other Clinician: Referring Provider: Treating Provider/Extender:Sarah [name], Sarah [name], Sarah [name] in Treatment: 10 History of Present Illness HPI Description: ADMISSION 05/19/2019 This is a an 86 year old woman who is referred from her oncologist Dr.Kale for review of a wound on her left anterior mid tibial area. although there are certain parts of the notes that accompany her and also in [place] link that suggests she had a wound on the right anterior mid tibia, the patient is fairly insistent that the current wound is the only wound she is ever had. She relates that this began as a small area on her left anterior mid tibia sometime in August i.e. clearly before the diagnosis or treatment of her lymphoma began. She said that has been gradually increasing in size over time she is completely unaware of any other precipitating factors such as trauma etc. She does have what looks to be changes of chronic venous insufficiency in her legs with some degree of hemosiderin deposition and edema but has never had problems with this before. The wound itself is exceptionally painful there is no drainage she has been using Bactroban on this twice a day with the help of her daughter. She was given a course of doxycycline last week by Dr. Donzetta [name] which she is tolerating well She also in September developed a widespread rash that was pruritic. She was seen by her primary doctor and dermatologist I believe. Ultimately she had a CT scan in early October that documented widespread adenopathy with splenomegaly.Marland Kitchen She was hospitalized from 03/06/2019 through 03/08/2019 presenting with shortness  of breath leg swelling on the left. She was diagnosed with a pulmonary embolism with a DVT in the left leg this predominantly involve the left posterior tibial veins and the left gastrocnemius veins. She is on Eliquis since then. The patient was ultimately diagnosed with angioimmunoblastic T-cell lymphoma and she has been on chemotherapy including Adriamycin Cytoxan and brentuximab. She was receiving a course of this every 3 [name]. She stated that the first round of chemotherapy caused her rash to go away. 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 So far she is tolerating her chemotherapy well although this will stretch out into the beginning of March 2021 The patient's major complaint is the pain she has burning pain even at night that sometimes awakens her from sleep. Past medical history; lymphoma as noted above, breast CA with a lumpectomy in 2013, right total knee replacement, hypothyroidism, pulmonary embolism as described, DVT in the left leg also as described. ABI in our clinic was 1.26 on the left 1/11; patient arrives with the surface of this wound completely covered and adherent eschar. Her daughter was able to show me pictures on her cell phone that suggest that this was getting better however it certainly does not look like that today. She will not let me even get close to this for debridement. Even washing the surface of the wound off seems to be on her. 1/25; 2-week follow-up. In general the wound on the left anterior tibia looks better although there is still surface debris under illumination. There is no ability to do mechanical debridement on this the patient simply cannot stand it. Even washing this off with Anasept and gauze  is prohibitive in terms of discomfort. We have been using Santyl and for this foreseeable future that we will need to continue. 2/8; 2-week follow-up. Unfortunately this does not look any better at all. Very tight  adherent debris. She barely will let me get even close to this for examination let alone debridement. We have been using Santyl without effect. The exact cause of this wound is not clear. She had chemotherapy for underlying lymphoma although the wound seems to have occurred before this. I change the primary dressing to Iodoflex with border foam and using her own stocking for edema control. The edema looks better today 2/22; 2-week follow-up. Slightly improved but not in terms of surface area. She did not tolerate the Iodoflex for the next week and she went back to the [place] but she has been using it for most of this past week. She has been using her compression stocking 3/1; we are using Iodoflex under wraps. I think her wound actually looks some better although there is still debris here. She tells [place] her lymphoma is in remission which is great news. She is on a short-term course of high-dose prednisone obviously not wonderful in terms of wound healing 3/8; we are using Iodoflex under compression. Wound bed looks a lot better although there is not much change in surface area. She will not allow mechanical debridement. She is taking the dressing off before she comes in here on a weekly basis and washing it out in the shower Electronic Signature(s) Signed: 07/28/2019 5:29:15 PM By: Sarah Ham MD Entered By: Sarah [name] on 07/28/2019 12:59:41 -------------------------------------------------------------------------------- Physical Exam Details Patient Name: Date of Service: Sarah [name] 07/28/2019 11:15 AM Medical Record [id number] Patient Account Number: [id number] Date of Birth/Sex: Treating RN: 09-24-1932 (86 y.o. [name] Primary Care Provider: Patrina [name] Other Clinician: Referring Provider: Treating Provider/Extender:Kamiyah Kindel, Sarah [name], Sarah [name] in Treatment: 10 Cardiovascular Pedal pulses are palpable. No major edema. Notes Wound exam;  the surface of the wound looks better. She has much less debris. Satellite lesion is closed. Anasept and gauze as usual. She will not allow mechanical debridement. Electronic Signature(s) Signed: 07/28/2019 5:29:15 PM By: Sarah Ham MD Entered By: Sarah [name] on 07/28/2019 13:00:37 -------------------------------------------------------------------------------- Physician Orders Details Patient Name: Date of Service: Sarah [name], Sarah [name] 07/28/2019 11:15 AM Medical Record [id number] Patient Account Number: [id number] Date of Birth/Sex: Treating RN: 11-03-1932 (86 y.o. Sarah [name] Primary Care Provider: Other Clinician: Lajean Manes T Referring Provider: Treating Provider/Extender:Autumnrose Yore, Sarah [name], Sarah [name] in Treatment: 10 Verbal / Phone Orders: No Diagnosis Coding ICD-10 Coding Code Description [phone] Non-pressure chronic ulcer of other part of left lower leg with other specified severity I87.312 Chronic venous hypertension (idiopathic) with ulcer of left lower extremity C86.5 Angioimmunoblastic T-cell lymphoma Follow-up Appointments Return Appointment in 1 week. Dressing Change Frequency Wound #1 Left,Anterior Lower Leg Do not change entire dressing for one week. Skin Barriers/Peri-Wound Care Barrier cream Moisturizing lotion Wound Cleansing Wound #1 Left,Anterior Lower Leg May shower with protection. Primary Wound Dressing Wound #1 Left,Anterior Lower Leg Iodoflex Secondary Dressing Wound #1 Left,Anterior Lower Leg Dry Gauze ABD pad Edema Control 3 Layer Compression System - Left Lower Extremity Avoid standing for long periods of time Elevate legs to the level of the heart or above for 30 minutes daily and/or when sitting, a frequency of: - throughout the day Electronic Signature(s) Signed: 07/28/2019 5:29:15 PM By: Sarah Ham MD Signed: 07/28/2019 5:59:01 PM By: Levan Hurst RN, BSN Entered By: Donnal Debar,  [place] on 07/28/2019  12:02:42 -------------------------------------------------------------------------------- Problem List Details Patient Name: Date of Service: Sarah [name], Sarah [name] 07/28/2019 11:15 AM Medical Record [id number] Patient Account Number: [id number] Date of Birth/Sex: Treating RN: 1932/11/30 (86 y.o. Sarah [name] Primary Care Provider: Other Clinician: Patrina [name] Referring Provider: Treating Provider/Extender:Jed Kutch, Sarah [name], Junius Creamer in Treatment: 10 Active Problems ICD-10 Evaluated Encounter Code Description Active Date Today Diagnosis L97.828 Non-pressure chronic ulcer of other part of left lower 05/19/2019 No Yes leg with other specified severity I87.312 Chronic venous hypertension (idiopathic) with ulcer of 05/19/2019 No Yes left lower extremity C86.5 Angioimmunoblastic T-cell lymphoma 05/19/2019 No Yes Inactive Problems Resolved Problems Electronic Signature(s) Signed: 07/28/2019 5:29:15 PM By: Sarah Ham MD Entered By: Sarah [name] on 07/28/2019 12:58:52 -------------------------------------------------------------------------------- Progress Note Details Patient Name: Date of Service: Sarah [name] 07/28/2019 11:15 AM Medical Record [id number] Patient Account Number: [id number] Date of Birth/Sex: Treating RN: February 08, 1933 (86 y.o. Sarah [name] Primary Care Provider: Patrina [name] Other Clinician: Referring Provider: Treating Provider/Extender:Itzel Mckibbin, Sarah [name], Sarah [name] in Treatment: 10 Subjective History of Present Illness (HPI) ADMISSION 05/19/2019 This is a an 86 year old woman who is referred from her oncologist Dr.Kale for review of a wound on her left anterior mid tibial area. although there are certain parts of the notes that accompany her and also in [place] link that suggests she had a wound on the right anterior mid tibia, the patient is fairly insistent that the current wound is the only wound  she is ever had. She relates that this began as a small area on her left anterior mid tibia sometime in August i.e. clearly before the diagnosis or treatment of her lymphoma began. She said that has been gradually increasing in size over time she is completely unaware of any other precipitating factors such as trauma etc. She does have what looks to be changes of chronic venous insufficiency in her legs with some degree of hemosiderin deposition and edema but has never had problems with this before. The wound itself is exceptionally painful there is no drainage she has been using Bactroban on this twice a day with the help of her daughter. She was given a course of doxycycline last week by Dr. Donzetta [name] which she is tolerating well She also in September developed a widespread rash that was pruritic. She was seen by her primary doctor and dermatologist I believe. Ultimately she had a CT scan in early October that documented widespread adenopathy with splenomegaly.Marland Kitchen She was hospitalized from 03/06/2019 through 03/08/2019 presenting with shortness of breath leg swelling on the left. She was diagnosed with a pulmonary embolism with a DVT in the left leg this predominantly involve the left posterior tibial veins and the left gastrocnemius veins. She is on Eliquis since then. The patient was ultimately diagnosed with angioimmunoblastic T-cell lymphoma and she has been on chemotherapy including Adriamycin Cytoxan and brentuximab. She was receiving a course of this every 3 [name]. She stated that the first round of chemotherapy caused her rash to go away. 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 So far she is tolerating her chemotherapy well although this will stretch out into the beginning of March 2021 The patient's major complaint is the pain she has burning pain even at night that sometimes awakens her from sleep. Past medical history; lymphoma as noted above, breast CA  with a lumpectomy in 2013, right total knee replacement, hypothyroidism, pulmonary embolism as described, DVT in the left leg also  as described. ABI in our clinic was 1.26 on the left 1/11; patient arrives with the surface of this wound completely covered and adherent eschar. Her daughter was able to show me pictures on her cell phone that suggest that this was getting better however it certainly does not look like that today. She will not let me even get close to this for debridement. Even washing the surface of the wound off seems to be on her. 1/25; 2-week follow-up. In general the wound on the left anterior tibia looks better although there is still surface debris under illumination. There is no ability to do mechanical debridement on this the patient simply cannot stand it. Even washing this off with Anasept and gauze is prohibitive in terms of discomfort. We have been using Santyl and for this foreseeable future that we will need to continue. 2/8; 2-week follow-up. Unfortunately this does not look any better at all. Very tight adherent debris. She barely will let me get even close to this for examination let alone debridement. We have been using Santyl without effect. The exact cause of this wound is not clear. She had chemotherapy for underlying lymphoma although the wound seems to have occurred before this. I change the primary dressing to Iodoflex with border foam and using her own stocking for edema control. The edema looks better today 2/22; 2-week follow-up. Slightly improved but not in terms of surface area. She did not tolerate the Iodoflex for the next week and she went back to the [place] but she has been using it for most of this past week. She has been using her compression stocking 3/1; we are using Iodoflex under wraps. I think her wound actually looks some better although there is still debris here. She tells [place] her lymphoma is in remission which is great news. She is on a  short-term course of high-dose prednisone obviously not wonderful in terms of wound healing 3/8; we are using Iodoflex under compression. Wound bed looks a lot better although there is not much change in surface area. She will not allow mechanical debridement. She is taking the dressing off before she comes in here on a weekly basis and washing it out in the shower Objective Constitutional Vitals Time Taken: 11:30 AM, Height: 64 in, Weight: 111 lbs, BMI: 19.1, Temperature: 98 F, Pulse: 75 bpm, Respiratory Rate: 18 breaths/min, Blood Pressure: 137/80 mmHg. Cardiovascular Pedal pulses are palpable. No major edema. General Notes: Wound exam; the surface of the wound looks better. She has much less debris. Satellite lesion is closed. Anasept and gauze as usual. She will not allow mechanical debridement. Integumentary (Hair, Skin) Wound #1 status is Open. Original cause of wound was Gradually Appeared. The wound is located on the Left,Anterior Lower Leg. The wound measures 2cm length x 1.8cm width x 0.1cm depth; 2.827cm^2 area and 0.283cm^3 volume. There is Fat Layer (Subcutaneous Tissue) Exposed exposed. There is no tunneling or undermining noted. There is a medium amount of serosanguineous drainage noted. The wound margin is flat and intact. There is medium (34-66%) pink granulation within the wound bed. There is a medium (34-66%) amount of necrotic tissue within the wound bed including Adherent Slough. Assessment Active Problems ICD-10 Non-pressure chronic ulcer of other part of left lower leg with other specified severity Chronic venous hypertension (idiopathic) with ulcer of left lower extremity Angioimmunoblastic T-cell lymphoma Procedures Wound #1 Pre-procedure diagnosis of Wound #1 is a Venous Leg Ulcer located on the Left,Anterior Lower Leg . There was a Three  Layer Compression Therapy Procedure by Levan Hurst, RN. Post procedure Diagnosis Wound #1: Same as  Pre-Procedure Plan Follow-up Appointments: Return Appointment in 1 week. Dressing Change Frequency: Wound #1 Left,Anterior Lower Leg: Do not change entire dressing for one week. Skin Barriers/Peri-Wound Care: Barrier cream Moisturizing lotion Wound Cleansing: Wound #1 Left,Anterior Lower Leg: May shower with protection. Primary Wound Dressing: Wound #1 Left,Anterior Lower Leg: Iodoflex Secondary Dressing: Wound #1 Left,Anterior Lower Leg: Dry Gauze ABD pad Edema Control: 3 Layer Compression System - Left Lower Extremity Avoid standing for long periods of time Elevate legs to the level of the heart or above for 30 minutes daily and/or when sitting, a frequency of: - throughout the day 1. Continue with Iodoflex/ABDs under 3 layer compression 2. May be able to change to [facility] in the next 2 [name] Electronic Signature(s) Signed: 07/28/2019 5:29:15 PM By: Sarah Ham MD Entered By: Sarah [name] on 07/28/2019 13:01:16 -------------------------------------------------------------------------------- SuperBill Details Patient Name: Date of Service: Sarah [name] 07/28/2019 Medical Record [id number] Patient Account Number: [id number] Date of Birth/Sex: Treating RN: 1932/07/18 (86 y.o. Sarah [name] Primary Care Provider: Lajean Manes T Other Clinician: Referring Provider: Treating Provider/Extender:Elsia [place]ota, Sarah [name], Sarah [name] in Treatment: 10 Diagnosis Coding ICD-10 Codes Code Description [phone] Non-pressure chronic ulcer of other part of left lower leg with other specified severity I87.312 Chronic venous hypertension (idiopathic) with ulcer of left lower extremity C86.5 Angioimmunoblastic T-cell lymphoma Facility Procedures CPT4 Code Description: [id number] (Facility Use Only) [phone] - APPLY [place] LT LEG Modifier: Quantity: 1 Physician Procedures Electronic Signature(s) Signed: 07/28/2019 5:59:01 PM By: Levan Hurst RN,  BSN Signed: 07/29/2019 5:48:31 PM By: Sarah Ham MD Previous Signature: 07/28/2019 5:29:15 PM Version By: Sarah Ham MD Entered By: Levan Hurst on 07/28/2019 17:39:29

## 2019-08-04 ENCOUNTER — Other Ambulatory Visit: Payer: Self-pay | Admitting: *Deleted

## 2019-08-04 ENCOUNTER — Other Ambulatory Visit: Payer: Self-pay

## 2019-08-04 ENCOUNTER — Encounter (HOSPITAL_BASED_OUTPATIENT_CLINIC_OR_DEPARTMENT_OTHER): Payer: Medicare Other | Admitting: Internal Medicine

## 2019-08-04 DIAGNOSIS — C8440 Peripheral T-cell lymphoma, not classified, unspecified site: Secondary | ICD-10-CM

## 2019-08-04 DIAGNOSIS — I10 Essential (primary) hypertension: Secondary | ICD-10-CM | POA: Diagnosis not present

## 2019-08-04 DIAGNOSIS — Z7901 Long term (current) use of anticoagulants: Secondary | ICD-10-CM | POA: Diagnosis not present

## 2019-08-04 DIAGNOSIS — E039 Hypothyroidism, unspecified: Secondary | ICD-10-CM | POA: Diagnosis not present

## 2019-08-04 DIAGNOSIS — C865 Angioimmunoblastic T-cell lymphoma: Secondary | ICD-10-CM | POA: Diagnosis not present

## 2019-08-04 DIAGNOSIS — L97828 Non-pressure chronic ulcer of other part of left lower leg with other specified severity: Secondary | ICD-10-CM | POA: Diagnosis not present

## 2019-08-04 DIAGNOSIS — I87312 Chronic venous hypertension (idiopathic) with ulcer of left lower extremity: Secondary | ICD-10-CM | POA: Diagnosis not present

## 2019-08-04 NOTE — Progress Notes (Signed)
HEMATOLOGY/ONCOLOGY CLINIC NOTE  Date of Service: 08/05/2019  Patient Care Team: Lajean Manes, MD as PCP - General (Internal Medicine) Buford Dresser, MD as PCP - Cardiology (Cardiology)  REFERRING PHYSICIAN: Lajean Manes, MD  CHIEF COMPLAINTS/PURPOSE OF CONSULTATION:  Continue mx of Recently Diagnosed Angioimmunoblastic T cell lymphoma      HISTORY OF PRESENTING ILLNESS:   Sarah [name] is a wonderful 86 y.o. female who has been referred to [place] by Lajean Manes, MD for evaluation and management of Possible lymphoma . She is accompanied today by her daughter Sarah [name] . The pt reports that she is doing well overall.   Pending Surgical pathology from 03/17/2019 Dr.Manny will contact clinic about pathology   August 25th/27th she began coughing and sneezing. On Sept 3 went to doctors and they said it was allergies. She began having rashes (little red dots). Went to PCP and they prescribed prednisone and for several days and the medication helped. But as soon as she completed the prescription symptoms reoccurred.   She had a chest X Ray done because she was having a severe cough on 02/17/2019 revealing "bilateral effusions in this patient with history of breast cancer,of uncertain significance with question of right lower lobe pulmonary nodule, consider chest CT for further assessment. Atherosclerotic changes in the thoracic aorta."  Dr. Amy Martinique biopsied the rashes on her back 02/21/2019 and it was determined that it could be because of an allergy to medication. Dr.Stoneking stopped her blood pressure medication from September- October but ther rash still present. The itching started 1 to 2 months ago. The rash would come and go and was mostly on her back. She still has rash on her left thigh.  She is using triaconazole and cetrizine.  She has lumps behind ear and abdomin and she states that they have swollen twice the size within 1 and half months  She has no appetite and  has changes in her taste that began around August. She has changes in bowels. They are more loose and smaller In size. She also has no control over bowels.  She feels fatigued and it has increased since August.   She has a cough that starts in September  that has made her horse.   She had a CT scan on 02/27/2019 because she was having severe cough and her stomach was very extended. Revealing "1. Extensive lymphadenopathy throughout the chest, abdomen, and pelvis, with index lymph nodes identified above. Splenomegaly. Findings are most consistent with lymphoma with recurrent, metastatic breast malignancy less favored. 2. There is generally osteopenic appearance of the skeleton. There are numerous subtle hypodense lesions, particularly of the pelvis (e.g. Series 2, image 88) and select vertebral bodies, for example L4 (series 8, image 92). This is suspicious although not definitive for osseous metastatic disease. Characterization for metabolic activity by nuclear scintigraphic bone scan or PET-CT would be helpful to further evaluate. 3. Moderate right, small left pleural effusions with associated atelectasis or consolidation. Subpleural radiation fibrosis of the anterior right lung. 4. There is a new subpleural pulmonary nodule of the anterior right middle lobe measuring 6 mm (series 4, image 105), nonspecific. There is no obvious pleural thickening or nodularity definitive for metastatic disease. 5.  Small volume ascites. 6. There is a fluid attenuation lesion of the pancreatic uncinate measuring 2.4 x 1.3 cm (series 2, image 63), not substantially changed when compared to remote prior MR examination dated 02/23/2012 and likely sequelae of a prior pseudocyst versus incidental pancreatic IPMN. 7.  Cardiomegaly."  Hospitalized from 03/06/2019-03/08/2019. She presenting with acute shortness of breath, leg swelling,left hand swelling and some weight gain despite poor appetite. In ED, CTA chest revealed at least  submassive PE, moderate right pleural effusion, diffuse LAD concerning for lymphoma. Started on heparin drip. PCCM consulted for guidance. She had thoracocentesis with removal of 1200 cc cloudy exudative pleural fluid. Cardiology consulted for elevated which was thought to be demand ischemia from right heart strain in the setting of PE. Patient was transitioned to subcu Lovenox by pulmonology the next day and remained stable. Unusual appearance of tracheal air column at the thoracic inlet. Correlate with any history of swallowing difficulty or changes in voice with dedicated imaging with chest CT and or CT of the neck as indicated.  She has a nonhealing ulcer on her right tibialis anterior that starting weeping one week ago. Of note since the patient's last visit, pt has had CHEST XRAY - 2 VIEW (Accession [id number]) completed on 02/17/2019 with results revealing "Bilateral effusions in this patient with history of breast cancer,of uncertain significance with question of right lower lobepulmonary nodule, consider chest CT for furtherassessment.Unusual appearance of tracheal air column at the thoracic inlet.Correlate with any history of swallowing difficulty or changes in voice with dedicated imaging with chest CT and or CT of the neck as Indicated. Atherosclerotic changes in the thoracic aorta."  Of note since the patient's last visit, pt has had CT CHEST, ABDOMEN, AND PELVIS WITH CONTRAST (Accession [id number]) completed on 02/27/2019 with results revealing "1. Extensive lymphadenopathy throughout the chest, abdomen, and pelvis, with index lymph nodes identified above. Splenomegaly.Findings are most consistent with lymphoma with recurrent, metastatic breast malignancy less favored. 2. There is generally osteopenic appearance of the skeleton. There are numerous subtle hypodense lesions, particularly of the pelvis (e.g. Series 2, image 88) and select vertebral bodies, for example L4 (series 8, image 92).  This is suspicious although not definitive for osseous metastatic disease. Characterization for metabolic activity by nuclear scintigraphic bone scan or PET-CT would be helpful to further evaluate.  3. Moderate right, small left pleural effusions with associated atelectasis or consolidation. Subpleural radiation fibrosis of the anterior right lung.4. There is a new subpleural pulmonary nodule of the anterior right middle lobe measuring 6 mm (series 4, image 105), nonspecific. There is no obvious pleural thickening or nodularity definitive for metastatic disease.5.  Small volume ascites. 6. There is a fluid attenuation lesion of the pancreatic uncinate measuring 2.4 x 1.3 cm (series 2, image 63), not substantially changed when compared to remote prior MR examination dated 02/23/2012 and likely sequelae of a prior pseudocyst versus incidental pancreatic IPMN.7.  Cardiomegaly.8. Other chronic and incidental findings as detailed above. Aortic Atherosclerosis (ICD10-I70.0)."  Of note since the patient's last visit, pt has had PORTABLE CHEST 1 VIEW (Accession [id number]) completed on 03/06/2019 with results revealing "1. Moderate size right and small left pleural effusions appear not significantly changed since 02/27/19. 2. No new cardiopulmonary abnormality."  Of note since the patient's last visit, pt has had ECHOCARDIOGRAM  completed on 03/06/2019 with results revealing  "1. Left ventricular ejection fraction, by visual estimation, is 60 to 65%. The left ventricle has normal function. Normal left ventricular size. There is no left ventricular hypertrophy. 2. Left ventricular diastolic Doppler parameters are consistent with impaired relaxation pattern of LV diastolic filling. 3. Global right ventricle has mildly reduced systolic function.The right ventricular size is moderately enlarged. No increase in right ventricular wall thickness. 4. Left atrial size was normal.  5. Right atrial size was mildly dilated.  6. Trivial pericardial effusion is present. 7. The mitral valve is normal in structure. Trace mitral valve regurgitation. No evidence of mitral stenosis. 8. The tricuspid valve is normal in structure. Tricuspid valve regurgitation severe. 9. The aortic valve is normal in structure. Aortic valve regurgitation was not visualized by color flow Doppler. Structurally normal aortic valve, with no evidence of sclerosis or stenosis.10. The pulmonic valve was normal in structure. Pulmonic valve regurgitation is mild by color flow Doppler.11. Mildly elevated pulmonary artery systolic pressure.12. The inferior vena cava is normal in size with greater than 50% respiratory variability, suggesting right atrial pressure of 3 mmHg."  Of note since the patient's last visit, pt has had CT ANGIOGRAPHY CHEST WITH CONTRAST (Accession [id number]) completed on 03/06/2019 with results revealing "1. Pulmonary embolus arising from the distal left main pulmonary artery with extension into multiple left lower lobe pulmonary arterial branches. There is also incompletely obstructing pulmonary embolus in the proximal left upper lobe pulmonary artery. More distal pulmonary embolus in the right lower lobe pulmonary artery. Positive for acute PE with CT evidence of right heart strain (RV/LV Ratio = 1.6) consistent with at least submassive (intermediate risk) PE. The presence of right heart strain has been associated with an increased risk of morbidity and mortality. Please activate Code PE by paging [phone]. 2. Sizable pleural effusion on the right with smaller pleural effusion on the left. Consolidation and compressive atelectasis throughout most of the right lower lobe. Milder atelectasis left base. Nodular opacities on the right, likely metastatic foci.3.  Stable adenopathy compared to 1 week prior.4. Enlargement of spleen, incompletely visualized but documented CT 1 week prior. Concern for potential lymphoma. 5. Aortic atherosclerosis.  No aneurysm or dissection evident. Foci of coronary artery calcification noted."  Most recent lab results (03/06/2019) of CBC is as follows: all values are WNL except for Platelets at 72, Eosinophils Absolute at 0.6,Abs Immature Granulocytes at 0.08 .  On review of systems, pt reports rashes, digestive issues and denies back pain, abdominal pain and any other symptoms.   On PMHx the pt reports Hypothyroidism, MCI, Hypercholesterolemia, Tracheal anomaly, Hypertension, Pulmonary nodule, Atherosclerosis of aorta, anxiety, gait disorder, primary insomnia.   INTERVAL HISTORY:  Sarah [name] is a 86 y.o. female here for evaluation and management of Angioimmunoblastic T-cell lymphoma. She is here for C6D1 of A-AVD. We are joined by her daughter, Sarah [name], today The patient's last visit with [place] was on 07/16/2019. The pt reports that she is doing well overall.  The pt reports that she has been feeling well and that Cycle 5 was a bit easier for her. Pt's daughter noticed that pt had a red spot on her right shoulder about two weeks ago. Pt has a Social research officer, government. She has no tingling/numbness in her hands or feet. Pt notes that her left leg ulcer has started to improve again. She has been eating better and has even gained weight.   Lab results today (08/05/19) of CBC w/diff and CMP is as follows: all values are WNL except for Lymphs Abs at 0.4K, Glucose at 111, Total Protein at 6.4, AST at 13. 08/05/2019 LDH at 202  On review of systems, pt reports healthy appetite, red spot on shoulder and denies tingling/numness in hands/feet, new lumps/bumps, constipation, diarrhea, unexpected weight loss and any other symptoms.   MEDICAL HISTORY:  Past Medical History:  Diagnosis Date  . Allergy    codeine, thiazides  . Anxiety  new dx  . Arthritis   . Breast cancer ([place]) 03/14/12   bx=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,ER/PR=+,upper inner quad  . Cancer ([place])    breast    . Cataract   . DVT (deep venous thrombosis) ([place]) 02/2019   left leg  . Dyspnea   . Edema    both legs feet and toe, abdomen  . Glaucoma    laser treated years ago  . HOH (hard of hearing)   . Hyperlipemia   . Hypertension   . Hypothyroidism   . Pancreatic cyst    benign  . PONV (postoperative nausea and vomiting)   . Pulmonary embolism ([place]) 02/2019   bilateral   . Radiation 06/11/2012-07/12/2012   17 sessions 4250 cGy, 3 sessions 750 cGy  . Vertigo   . Wears glasses   . Wears partial dentures    partial upper     SURGICAL HISTORY: Past Surgical History:  Procedure Laterality Date  . ABDOMINAL HYSTERECTOMY  1966   1/2 ovary left in   . BREAST SURGERY  1992   lumpectomy-lt  . CATARACT EXTRACTION     b/l  . COLONOSCOPY    . EXCISION MASS NECK Left 03/17/2019    EXCISION MASS NECK (Left Neck)  . EXCISION MASS NECK Left 03/17/2019   Procedure: EXCISION MASS NECK;  Surgeon: Helayne Seminole, MD;  Location: [place];  Service: ENT;  Laterality: Left;  . EYE SURGERY Bilateral    bilateral cataract removal  . IR IMAGING GUIDED PORT INSERTION  04/23/2019  . JOINT REPLACEMENT  2013   rt total knee  . JOINT REPLACEMENT  1995   lt total knee  . PARTIAL MASTECTOMY WITH NEEDLE LOCALIZATION  04/09/2012   Procedure: PARTIAL MASTECTOMY WITH NEEDLE LOCALIZATION;  Surgeon: Adin Hector, MD;  Location: [place];  Service: General;  Laterality: Right;  . SKIN BIOPSY Left 03/17/2019   LEFT THIGH  . SKIN BIOPSY Left 03/17/2019   Procedure: Skin Biopsy Left Thigh;  Surgeon: Helayne Seminole, MD;  Location: [place];  Service: ENT;  Laterality: Left;  . TONSILLECTOMY    . TOTAL KNEE ARTHROPLASTY  08/16/2011   Procedure: TOTAL KNEE ARTHROPLASTY;  Surgeon: Ninetta Lights, MD;  Location: [place];  Service: Orthopedics;  Laterality: Right;     SOCIAL HISTORY: Social History   Socioeconomic History  . Marital status: Widowed    Spouse name: Not on file  .  Number of children: 2  . Years of education: Not on file  . Highest education level: Not on file  Occupational History  . Occupation: Retired    Comment: Community education officer   Tobacco Use  . Smoking status: Never Smoker  . Smokeless tobacco: Never Used  Substance and Sexual Activity  . Alcohol use: No  . Drug use: No  . Sexual activity: Not Currently  Other Topics Concern  . Not on file  Social History Narrative  . Not on file   Social Determinants of Health   Financial Resource Strain:   . Difficulty of Paying Living Expenses:   Food Insecurity:   . Worried About Charity fundraiser in the Last Year:   . Arboriculturist in the Last Year:   Transportation Needs:   . Film/video editor (Medical):   Marland Kitchen Lack of Transportation (Non-Medical):   Physical Activity:   . Days of Exercise per Week:   . Minutes of Exercise per Session:   Stress:   .  Feeling of Stress :   Social Connections:   . Frequency of Communication with Friends and Family:   . Frequency of Social Gatherings with Friends and Family:   . Attends Religious Services:   . Active Member of Clubs or Organizations:   . Attends Archivist Meetings:   Marland Kitchen Marital Status:   Intimate Partner Violence:   . Fear of Current or Ex-Partner:   . Emotionally Abused:   Marland Kitchen Physically Abused:   . Sexually Abused:      FAMILY HISTORY: Family History  Problem Relation Age of Onset  . Heart disease Father   . Cancer Maternal Aunt        stomach     ALLERGIES:   is allergic to codeine; latex; and thiazide-type diuretics.   MEDICATIONS:  Current Outpatient Medications  Medication Sig Dispense Refill  . amLODipine (NORVASC) 2.5 MG tablet Take 2.5-5 mg by mouth See admin instructions. 5 mg in the morning, 2.5 mg in the evening    . apixaban (ELIQUIS) 5 MG TABS tablet Take 1 tablet (5 mg total) by mouth 2 (two) times daily. 180 tablet 0  . Cholecalciferol (VITAMIN D) 50 MCG (2000 UT) tablet Take 2,000 Units by  mouth 2 (two) times daily.    . Cyanocobalamin (VITAMIN B-12 PO) Take 3,000 mcg by mouth daily.     Marland Kitchen levothyroxine (SYNTHROID) 25 MCG tablet Take 25 mcg by mouth daily before breakfast.     . lidocaine-prilocaine (EMLA) cream Apply to affected area once 30 g 3  . LORazepam (ATIVAN) 0.5 MG tablet Take 0.5-1 tablets (0.25-0.5 mg total) by mouth every 8 (eight) hours. 30 tablet 0  . Multiple Vitamins-Minerals (PRESERVISION AREDS PO) Take 1 capsule by mouth 2 (two) times daily.     . ondansetron (ZOFRAN) 8 MG tablet Take 1 tablet (8 mg total) by mouth 2 (two) times daily as needed. Start on the third day after chemotherapy. 30 tablet 1  . polyethylene glycol (MIRALAX / GLYCOLAX) 17 g packet Take 17 g by mouth daily.    . predniSONE (DELTASONE) 10 MG tablet Take 3 tablets (30 mg total) by mouth daily with breakfast. 15 tablet 1  . prochlorperazine (COMPAZINE) 10 MG tablet Take 1 tablet (10 mg total) by mouth every 6 (six) hours as needed (Nausea or vomiting). 30 tablet 1  . zolpidem (AMBIEN) 10 MG tablet Take 2.5 mg by mouth at bedtime.     . Calcium Carbonate (CALCIUM 600 PO) Take 600 mg by mouth daily.    . cetirizine (ZYRTEC) 10 MG tablet Take 10 mg by mouth daily.     . Cyanocobalamin (B-12 PO) Take by mouth.    . erythromycin ophthalmic ointment SMARTSIG:1 Sparingly Right Eye 4 Times Daily    . fluticasone furoate-vilanterol (BREO ELLIPTA) 100-25 MCG/INH AEPB Inhale 1 puff into the lungs daily. (Patient not taking: Reported on 04/25/2019) 60 each 0  . gabapentin (NEURONTIN) 100 MG capsule Take 2 capsules (200 mg total) by mouth at bedtime. (Patient not taking: Reported on 07/16/2019) 60 capsule 2  . mupirocin ointment (BACTROBAN) 2 % Place 1 application into the nose 2 (two) times daily. 22 g 0  . triamcinolone cream (KENALOG) 0.1 % Apply 1 application topically 3 (three) times daily.     . vitamin E 400 UNIT capsule Take by mouth.     No current facility-administered medications for this visit.     Facility-Administered Medications Ordered in Other Visits  Medication Dose Route Frequency Provider  Last Rate Last Admin  . brentuximab vedotin (ADCETRIS) 65 mg in sodium chloride 0.9 % 100 mL chemo infusion  1.2 mg/kg (Order-Specific) Intravenous Once Brunetta Genera, MD 226 mL/hr at 08/05/19 1202 65 mg at 08/05/19 1202  . heparin lock flush 100 unit/mL  500 Units Intracatheter Once PRN Brunetta Genera, MD      . sodium chloride flush (NS) 0.9 % injection 10 mL  10 mL Intracatheter PRN Brunetta Genera, MD         REVIEW OF SYSTEMS:   A 10+ POINT REVIEW OF SYSTEMS WAS OBTAINED including neurology, dermatology, psychiatry, cardiac, respiratory, lymph, extremities, GI, GU, Musculoskeletal, constitutional, breasts, reproductive, HEENT.  All pertinent positives are noted in the HPI.  All others are negative.   PHYSICAL EXAMINATION: ECOG FS:1 - Symptomatic but completely ambulatory  Vitals:   08/05/19 0941  BP: (!) 146/77  Pulse: 90  Resp: 18  Temp: 98.2 F (36.8 C)  SpO2: 100%   Wt Readings from Last 3 Encounters:  08/05/19 113 lb 9.6 oz (51.5 kg)  07/16/19 110 lb 6.4 oz (50.1 kg)  06/25/19 113 lb 4.8 oz (51.4 kg)   Body mass index is 19.5 kg/m.    Exam was given in a chair   GENERAL:alert, in no acute distress and comfortable SKIN: no acute rashes, no significant lesions EYES: conjunctiva are pink and non-injected, sclera anicteric OROPHARYNX: MMM, no exudates, no oropharyngeal erythema or ulceration NECK: supple, no JVD LYMPH:  no palpable lymphadenopathy in the cervical, axillary or inguinal regions LUNGS: clear to auscultation b/l with normal respiratory effort HEART: regular rate & rhythm ABDOMEN:  normoactive bowel sounds , non tender, not distended. No palpable hepatosplenomegaly.  Extremity: no pedal edema PSYCH: alert & oriented x 3 with fluent speech NEURO: no focal motor/sensory deficits  LABORATORY DATA:  I have reviewed the data as  listed  CBC Latest Ref Rng & Units 08/05/2019 07/16/2019 06/25/2019  WBC 4.0 - 10.5 K/uL 5.0 7.1 7.9  Hemoglobin 12.0 - 15.0 g/dL 13.0 12.9 12.6  Hematocrit 36.0 - 46.0 % 41.2 39.6 39.0  Platelets 150 - 400 K/uL 160 209 223    CMP Latest Ref Rng & Units 08/05/2019 07/16/2019 06/25/2019  Glucose 70 - 99 mg/dL 111(H) 104(H) 78  BUN 8 - 23 mg/dL 18 22 25(H)  Creatinine 0.44 - 1.00 mg/dL 0.69 0.71 0.70  Sodium 135 - 145 mmol/L 144 144 142  Potassium 3.5 - 5.1 mmol/L 3.8 3.4(L) 4.1  Chloride 98 - 111 mmol/L 108 107 106  CO2 22 - 32 mmol/L 25 27 27   Calcium 8.9 - 10.3 mg/dL 9.1 9.4 9.2  Total Protein 6.5 - 8.1 g/dL 6.4(L) 6.6 6.7  Total Bilirubin 0.3 - 1.2 mg/dL 0.5 0.6 0.5  Alkaline Phos 38 - 126 U/L 64 85 87  AST 15 - 41 U/L 13(L) 11(L) 11(L)  ALT 0 - 44 U/L 10 9 9    Component     Latest Ref Rng & Units 03/27/2019  Hepatitis B Surface Ag     NON REACTIVE NON REACTIVE  Hep B Core Total Ab     NON REACTIVE NON REACTIVE  HCV Ab     NON REACTIVE NON REACTIVE  LDH     98 - 192 U/L 252 (H)     04/08/2019 NM PET Image Initial (PI) Skull Base To Thigh (Accession [id number])  04/04/2019 PATHOLOGY   04/07/2019 ECHOCARDIOGRAM  03/28/2019 PATHOLOGY   03/06/2019 CT ANGIOGRAPHY CHEST WITH CONTRAST (Accession [id number])  04/07/2019 ECHOCARDIOGRAM  03/06/2019 ECHOCARDIOGRAM     03/06/2019 PORTABLE CHEST 1 VIEW (Accession [id number])    02/27/2019 CT CHEST, ABDOMEN, AND PELVIS WITH CONTRAST (Accession [id number])    02/17/2019 CHEST XRAY - 2 VIEW (Accession [id number])    RADIOGRAPHIC STUDIES: I have personally reviewed the radiological images as listed and agreed with the findings in the report. DG Chest 2 View  Result Date: 07/17/2019 CLINICAL DATA:  Follow-up pleural effusion. EXAM: CHEST - 2 VIEW COMPARISON:  04/25/2019 FINDINGS: The heart is upper limits of normal in size and stable. Stable tortuosity and calcification of the thoracic aorta. Right IJ power port is in good  position, unchanged. Stable surgical changes involving the right breast. No acute pulmonary findings. No recurrent pleural effusion. No worrisome pulmonary lesions. Small pulmonary nodule seen on prior PET-CT are not well demonstrated on this study. IMPRESSION: No acute cardiopulmonary findings.  No recurrent pleural effusion. Electronically Signed   By: Marijo Sanes M.D.   On: 07/17/2019 11:10     ASSESSMENT & PLAN:   SHAMERIA TRIMARCO is a 86 y.o. female with:  1. 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 SKIN, LEFT THIGH, BIOPSY: - Involvement by angioimmunoblastic T-cell lymphoma." -04/07/2019 Echocardiogram - normal EF -NM PET Image Initial (PI) Skull Base To Thigh (Accession [id number]) completed on 04/08/2019 with results revealing "1. Widespread hypermetabolic adenopathy in the neck, chest, and abdomen/pelvis, primarily in the Deauville 4 and Deauville 5 range. There is also hypermetabolic activity along the posterior nasopharynx in lingual tonsillar regions potentially indicating sites of involvement. 2. The subtle hypodensities in the spine and bony pelvis are not appreciably hypermetabolic may be incidental, surveillance is suggested. 3. Bilateral thyroid activity but especially on the left. Probably from thyroiditis. 4. A 6 mm subpleural nodule anteriorly in the right lower lobe not appreciably hypermetabolic but is below sensitive PET-CT size thresholds and merits surveillance. 5. Mildly accentuated diffuse splenic activity without splenomegaly or focal splenic lesion identified. 6. Other imaging findings of potential clinical significance: Aortic Atherosclerosis (ICD10-I70.0). Coronary atherosclerosis. Large right and small left pleural effusions. Moderate cardiomegaly. Small pericardial effusion. Mild mesenteric edema. Large left renal cyst. Fullness  of both renal collecting systems with suspected nonobstructive left nephrolithiasis. Degenerative grade 1 anterolisthesis at L4-5 at L5-S1. Trace pelvic ascites."  05/28/2019 PET/CT ([id number]) revealed "Complete metabolic response to therapy".   2. Pulmonary Embolism -extensive .likely related to extensive malignancy  3. H/o Breast Cancer  -The patient had bilateral diagnostic  mammography at [facility] 07/11/2011. This  showed some calcifications in the right  breast, which seemed a little bit more  prominent than prior. In the left breast  there was an area of possible  architectural distortion. However left  breast ultrasound the same day showed  no abnormality. 6 month followup was  suggested, and on 02/28/2012 the  patient again had bilateral diagnostic  mammography, now with right  ultrasonography. The microcalcifications  in the right breast appeared increased.  Ultrasound showed a hypoechoic lesion  measuring 5 mm, with no associated  shadowing. This had been previously  noted and appeared unchanged. Anterior  to that there was a small hypoechoic  mass measuring 4 mm in diameter.  There was felt to be suspicious, and on  03/14/2012 the patient underwent biopsy  of the right breast mass, showing ([id number]  [id number]) ductal carcinoma in situ,  intermediate grade, estrogen receptor  100% and progesterone  receptor 100%  positive.   -Bilateral breast MRI was obtained  03/26/2012. This showed only post  biopsy changes in the right breast,  associated with an area of non-masslike  enhancement measuring 2.4 cm. The  left breast was unremarkable, and there  was no enlarged axillary or internal  mammary adenopathy noted.  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 The patient's  subsequent history is as detailed below.   PLAN: -Discussed pt labwork  today, 08/05/19; blood counts look good, blood chemistries are stable -Discussed 08/05/2019 LDH is only slightly elevated at 202 -Advised pt that the risk of relapse is highest in the first two years and that the risk of relapse is about 40%.  -Advised pt that if she is in remission, based on PET/CT, we would continue to f/u with visits every 3 months for the first 2 years.  -The pt has no prohibitive toxicities from continuing C6D1 of Brentuximab at this time -Will complete C6 with Brentuximab alone. Chemotherapy discontinued.  -Recommend pt continue to elevate her legs and wear compression socks -Recommend pt f/u with [facility] for LLE ulcer management -Recommend pt f/u with Dermatologist as scheduled  -Continue 2000IU Cholecalciferol BID  -Continue 30 mg Prednisone  -Continue Lorazepam as needed  -Will repeat PET/CT in 5 weeks  -Will return to clinic in 6 weeks with labs and a port flush   FOLLOW-UP: PET/CT in 5 weeks RTC with Dr Irene Limbo in 6 weeks with port flush and labs   The total time spent in the appt was 30 minutes and more than 50% was on counseling and direct patient cares, placed and signing chemotherapy orders and co-ordinating care   All of the patient's questions were answered with apparent satisfaction. The patient knows to call the clinic with any problems, questions or concerns.    Sullivan Lone MD [place] AAHIVMS [facility] Hematology/Oncology Physician [facility]  (Office):       [phone] (Work cell):  [phone] (Fax):           [phone]  08/05/2019 12:07 PM  I, Yevette Edwards, am acting as a scribe for Dr. Sullivan Lone.   .I have reviewed the above documentation for accuracy and completeness, and I agree with the above. Brunetta Genera MD

## 2019-08-05 ENCOUNTER — Other Ambulatory Visit: Payer: Self-pay

## 2019-08-05 ENCOUNTER — Telehealth: Payer: Self-pay | Admitting: *Deleted

## 2019-08-05 ENCOUNTER — Inpatient Hospital Stay: Payer: Medicare Other

## 2019-08-05 ENCOUNTER — Inpatient Hospital Stay (HOSPITAL_BASED_OUTPATIENT_CLINIC_OR_DEPARTMENT_OTHER): Payer: Medicare Other | Admitting: Hematology

## 2019-08-05 ENCOUNTER — Inpatient Hospital Stay: Payer: Medicare Other | Attending: Hematology

## 2019-08-05 VITALS — BP 146/77 | HR 90 | Temp 98.2°F | Resp 18 | Ht 64.0 in | Wt 113.6 lb

## 2019-08-05 DIAGNOSIS — I2699 Other pulmonary embolism without acute cor pulmonale: Secondary | ICD-10-CM | POA: Insufficient documentation

## 2019-08-05 DIAGNOSIS — C8440 Peripheral T-cell lymphoma, not classified, unspecified site: Secondary | ICD-10-CM

## 2019-08-05 DIAGNOSIS — Z853 Personal history of malignant neoplasm of breast: Secondary | ICD-10-CM | POA: Diagnosis not present

## 2019-08-05 DIAGNOSIS — Z5111 Encounter for antineoplastic chemotherapy: Secondary | ICD-10-CM | POA: Diagnosis not present

## 2019-08-05 DIAGNOSIS — C865 Angioimmunoblastic T-cell lymphoma: Secondary | ICD-10-CM | POA: Insufficient documentation

## 2019-08-05 DIAGNOSIS — Z5112 Encounter for antineoplastic immunotherapy: Secondary | ICD-10-CM | POA: Insufficient documentation

## 2019-08-05 DIAGNOSIS — Z7189 Other specified counseling: Secondary | ICD-10-CM

## 2019-08-05 LAB — CBC WITH DIFFERENTIAL (CANCER CENTER ONLY)
Abs Immature Granulocytes: 0.01 10*3/uL (ref 0.00–0.07)
Basophils Absolute: 0.1 10*3/uL (ref 0.0–0.1)
Basophils Relative: 1 %
Eosinophils Absolute: 0.2 10*3/uL (ref 0.0–0.5)
Eosinophils Relative: 4 %
HCT: 41.2 % (ref 36.0–46.0)
Hemoglobin: 13 g/dL (ref 12.0–15.0)
Immature Granulocytes: 0 %
Lymphocytes Relative: 9 %
Lymphs Abs: 0.4 10*3/uL — ABNORMAL LOW (ref 0.7–4.0)
MCH: 29.3 pg (ref 26.0–34.0)
MCHC: 31.6 g/dL (ref 30.0–36.0)
MCV: 93 fL (ref 80.0–100.0)
Monocytes Absolute: 0.5 10*3/uL (ref 0.1–1.0)
Monocytes Relative: 9 %
Neutro Abs: 3.9 10*3/uL (ref 1.7–7.7)
Neutrophils Relative %: 77 %
Platelet Count: 160 10*3/uL (ref 150–400)
RBC: 4.43 MIL/uL (ref 3.87–5.11)
RDW: 14.2 % (ref 11.5–15.5)
WBC Count: 5 10*3/uL (ref 4.0–10.5)
nRBC: 0 % (ref 0.0–0.2)

## 2019-08-05 LAB — CMP (CANCER CENTER ONLY)
ALT: 10 U/L (ref 0–44)
AST: 13 U/L — ABNORMAL LOW (ref 15–41)
Albumin: 3.7 g/dL (ref 3.5–5.0)
Alkaline Phosphatase: 64 U/L (ref 38–126)
Anion gap: 11 (ref 5–15)
BUN: 18 mg/dL (ref 8–23)
CO2: 25 mmol/L (ref 22–32)
Calcium: 9.1 mg/dL (ref 8.9–10.3)
Chloride: 108 mmol/L (ref 98–111)
Creatinine: 0.69 mg/dL (ref 0.44–1.00)
GFR, Est AFR Am: >60 mL/min (ref >60)
GFR, Estimated: >60 mL/min (ref >60)
Glucose, Bld: 111 mg/dL — ABNORMAL HIGH (ref 70–99)
Potassium: 3.8 mmol/L (ref 3.5–5.1)
Sodium: 144 mmol/L (ref 135–145)
Total Bilirubin: 0.5 mg/dL (ref 0.3–1.2)
Total Protein: 6.4 g/dL — ABNORMAL LOW (ref 6.5–8.1)

## 2019-08-05 LAB — LACTATE DEHYDROGENASE: LDH: 202 U/L — ABNORMAL HIGH (ref 98–192)

## 2019-08-05 MED ORDER — DEXAMETHASONE SODIUM PHOSPHATE 10 MG/ML IJ SOLN
5.0000 mg | Freq: Once | INTRAMUSCULAR | Status: AC
  Administered 2019-08-05: 5 mg via INTRAVENOUS

## 2019-08-05 MED ORDER — SODIUM CHLORIDE 0.9 % IV SOLN
1.2000 mg/kg | Freq: Once | INTRAVENOUS | Status: AC
  Administered 2019-08-05: 65 mg via INTRAVENOUS
  Filled 2019-08-05: qty 13

## 2019-08-05 MED ORDER — ONDANSETRON HCL 4 MG/2ML IJ SOLN
INTRAMUSCULAR | Status: AC
  Filled 2019-08-05: qty 2

## 2019-08-05 MED ORDER — ONDANSETRON HCL 4 MG/2ML IJ SOLN
4.0000 mg | Freq: Once | INTRAMUSCULAR | Status: AC
  Administered 2019-08-05: 4 mg via INTRAVENOUS

## 2019-08-05 MED ORDER — FAMOTIDINE IN NACL 20-0.9 MG/50ML-% IV SOLN
20.0000 mg | Freq: Once | INTRAVENOUS | Status: AC
  Administered 2019-08-05: 20 mg via INTRAVENOUS

## 2019-08-05 MED ORDER — SODIUM CHLORIDE 0.9% FLUSH
10.0000 mL | INTRAVENOUS | Status: DC | PRN
  Administered 2019-08-05: 10 mL
  Filled 2019-08-05: qty 10

## 2019-08-05 MED ORDER — LORAZEPAM 2 MG/ML IJ SOLN
0.5000 mg | Freq: Once | INTRAMUSCULAR | Status: AC
  Administered 2019-08-05: 0.5 mg via INTRAVENOUS

## 2019-08-05 MED ORDER — LORAZEPAM 2 MG/ML IJ SOLN
INTRAMUSCULAR | Status: AC
  Filled 2019-08-05: qty 1

## 2019-08-05 MED ORDER — DIPHENHYDRAMINE HCL 50 MG/ML IJ SOLN
INTRAMUSCULAR | Status: AC
  Filled 2019-08-05: qty 1

## 2019-08-05 MED ORDER — SODIUM CHLORIDE 0.9 % IV SOLN
Freq: Once | INTRAVENOUS | Status: AC
  Filled 2019-08-05: qty 250

## 2019-08-05 MED ORDER — HEPARIN SOD (PORK) LOCK FLUSH 100 UNIT/ML IV SOLN
500.0000 [IU] | Freq: Once | INTRAVENOUS | Status: AC | PRN
  Administered 2019-08-05: 500 [IU]
  Filled 2019-08-05: qty 5

## 2019-08-05 MED ORDER — FAMOTIDINE IN NACL 20-0.9 MG/50ML-% IV SOLN
INTRAVENOUS | Status: AC
  Filled 2019-08-05: qty 50

## 2019-08-05 MED ORDER — ACETAMINOPHEN 325 MG PO TABS
ORAL_TABLET | ORAL | Status: AC
  Filled 2019-08-05: qty 2

## 2019-08-05 MED ORDER — ACETAMINOPHEN 325 MG PO TABS
650.0000 mg | ORAL_TABLET | Freq: Once | ORAL | Status: AC
  Administered 2019-08-05: 650 mg via ORAL

## 2019-08-05 MED ORDER — DIPHENHYDRAMINE HCL 50 MG/ML IJ SOLN
50.0000 mg | Freq: Once | INTRAMUSCULAR | Status: AC
  Administered 2019-08-05: 50 mg via INTRAVENOUS

## 2019-08-05 NOTE — Telephone Encounter (Signed)
Clarified prednisone directions with daughter - take 3 tablets (10mg  each) every morning with food for 5 days post chemo

## 2019-08-05 NOTE — Patient Instructions (Signed)
[place] Discharge Instructions for Patients Receiving Chemotherapy  Today you received the following chemotherapy agents:  brentuximab vedotin (ADETRIS)  To help prevent nausea and vomiting after your treatment, we encourage you to take your nausea medication as prescribed.   If you develop nausea and vomiting that is not controlled by your nausea medication, call the clinic.   BELOW ARE SYMPTOMS THAT SHOULD BE REPORTED IMMEDIATELY:  *FEVER GREATER THAN 100.5 F  *CHILLS WITH OR WITHOUT FEVER  NAUSEA AND VOMITING THAT IS NOT CONTROLLED WITH YOUR NAUSEA MEDICATION  *UNUSUAL SHORTNESS OF BREATH  *UNUSUAL BRUISING OR BLEEDING  TENDERNESS IN MOUTH AND THROAT WITH OR WITHOUT PRESENCE OF ULCERS  *URINARY PROBLEMS  *BOWEL PROBLEMS  UNUSUAL RASH Items with * indicate a potential emergency and should be followed up as soon as possible.  Feel free to call the clinic should you have any questions or concerns. The clinic phone number is [phone].  Please show the [place] at check-in to the Emergency Department and triage nurse.

## 2019-08-05 NOTE — Progress Notes (Signed)
Sarah, [name] ([id number]) Visit Report for 08/04/2019 HPI Details Patient Name: Date of Service: Sarah [name], Sarah [name] 08/04/2019 11:15 AM Medical Record [id number] Patient Account Number: [id number] Date of Birth/Sex: Treating RN: [birth year] (86 y.o. Sarah [name] Primary Care Provider: Patrina Levering Other Clinician: Referring Provider: Treating Provider/Extender:Nani Ingram, [name], Hal T Weeks in Treatment: 11 History of Present Illness HPI Description: ADMISSION 05/19/2019 This is a an 86 year old woman who is referred from her oncologist Dr.Kale for review of a wound on her left anterior mid tibial area. although there are certain parts of the notes that accompany her and also in [place] link that suggests she had a wound on the right anterior mid tibia, the patient is fairly insistent that the current wound is the only wound she is ever had. She relates that this began as a small area on her left anterior mid tibia sometime in August i.e. clearly before the diagnosis or treatment of her lymphoma began. She said that has been gradually increasing in size over time she is completely unaware of any other precipitating factors such as trauma etc. She does have what looks to be changes of chronic venous insufficiency in her legs with some degree of hemosiderin deposition and edema but has never had problems with this before. The wound itself is exceptionally painful there is no drainage she has been using Bactroban on this twice a day with the help of her daughter. She was given a course of doxycycline last week by Dr. Donzetta Matters which she is tolerating well She also in September developed a widespread rash that was pruritic. She was seen by her primary doctor and dermatologist I believe. Ultimately she had a CT scan in early October that documented widespread adenopathy with splenomegaly.Marland Kitchen She was hospitalized from 03/06/2019 through 03/08/2019 presenting with shortness  of breath leg swelling on the left. She was diagnosed with a pulmonary embolism with a DVT in the left leg this predominantly involve the left posterior tibial veins and the left gastrocnemius veins. She is on Eliquis since then. The patient was ultimately diagnosed with angioimmunoblastic T-cell lymphoma and she has been on chemotherapy including Adriamycin Cytoxan and brentuximab. She was receiving a course of this every 3 weeks. She stated that the first round of chemotherapy caused her rash to go away. 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 So far she is tolerating her chemotherapy well although this will stretch out into the beginning of March 2021 The patient's major complaint is the pain she has burning pain even at night that sometimes awakens her from sleep. Past medical history; lymphoma as noted above, breast CA with a lumpectomy in 2013, right total knee replacement, hypothyroidism, pulmonary embolism as described, DVT in the left leg also as described. ABI in our clinic was 1.26 on the left 1/11; patient arrives with the surface of this wound completely covered and adherent eschar. Her daughter was able to show me pictures on her cell phone that suggest that this was getting better however it certainly does not look like that today. She will not let me even get close to this for debridement. Even washing the surface of the wound off seems to be on her. 1/25; 2-week follow-up. In general the wound on the left anterior tibia looks better although there is still surface debris under illumination. There is no ability to do mechanical debridement on this the patient simply cannot stand it. Even washing this off with Anasept and gauze  is prohibitive in terms of discomfort. We have been using Santyl and for this foreseeable future that we will need to continue. 2/8; 2-week follow-up. Unfortunately this does not look any better at all. Very tight  adherent debris. She barely will let me get even close to this for examination let alone debridement. We have been using Santyl without effect. The exact cause of this wound is not clear. She had chemotherapy for underlying lymphoma although the wound seems to have occurred before this. I change the primary dressing to Iodoflex with border foam and using her own stocking for edema control. The edema looks better today 2/22; 2-week follow-up. Slightly improved but not in terms of surface area. She did not tolerate the Iodoflex for the next week and she went back to the [place] but she has been using it for most of this past week. She has been using her compression stocking 3/1; we are using Iodoflex under wraps. I think her wound actually looks some better although there is still debris here. She tells [place] her lymphoma is in remission which is great news. She is on a short-term course of high-dose prednisone obviously not wonderful in terms of wound healing 3/8; we are using Iodoflex under compression. Wound bed looks a lot better although there is not much change in surface area. She will not allow mechanical debridement. She is taking the dressing off before she comes in here on a weekly basis and washing it out in the shower 3/15; using Iodoflex under compression the wound bed looks better slightly smaller. She has 2 rims of epithelialization Electronic Signature(s) Signed: 08/05/2019 10:32:20 AM By: Linton Ham MD Entered By: Linton Ham on 08/04/2019 12:19:43 -------------------------------------------------------------------------------- Physical Exam Details Patient Name: Date of Service: Sarah [name] 08/04/2019 11:15 AM Medical Record [id number] Patient Account Number: [id number] Date of Birth/Sex: Treating RN: 1932-10-29 (86 y.o. Sarah [name] Primary Care Provider: Patrina Levering Other Clinician: Referring Provider: Treating Provider/Extender:Hanako Tipping,  [name], Hal T Weeks in Treatment: 11 Constitutional Sitting or standing Blood Pressure is within target range for patient.. Pulse regular and within target range for patient.Marland Kitchen Respirations regular, non-labored and within target range.. Temperature is normal and within the target range for the patient.Marland Kitchen Appears in no distress. Cardiovascular Fetal pulses are palpable. Edema is well controlled. Integumentary (Hair, Skin) No evidence of infection around the wound. Notes Wound exam; the surface of the wound looks better she has much less debris satellite lesion is closed. Anasept and gauze as usual which she barely tolerates. No mechanical debridement. Electronic Signature(s) Signed: 08/05/2019 10:32:20 AM By: Linton Ham MD Entered By: Linton Ham on 08/04/2019 12:22:00 -------------------------------------------------------------------------------- Physician Orders Details Patient Name: Date of Service: AIYLA, BAUCOM 08/04/2019 11:15 AM Medical Record [id number] Patient Account Number: [id number] Date of Birth/Sex: Treating RN: [birth year] (86 y.o. Alva [name] Primary Care Provider: Lajean Manes T Other Clinician: Referring Provider: Treating Provider/Extender:Reina Wilton, [name], Junius Creamer in Treatment: 20 Verbal / Phone Orders: No Diagnosis Coding ICD-10 Coding Code Description [phone] Non-pressure chronic ulcer of other part of left lower leg with other specified severity I87.312 Chronic venous hypertension (idiopathic) with ulcer of left lower extremity C86.5 Angioimmunoblastic T-cell lymphoma Follow-up Appointments Return Appointment in 1 week. Dressing Change Frequency Wound #1 Left,Anterior Lower Leg Do not change entire dressing for one week. Skin Barriers/Peri-Wound Care Barrier cream Moisturizing lotion Wound Cleansing Wound #1 Left,Anterior Lower Leg May shower with protection. Primary Wound Dressing Wound #1  Left,Anterior Lower Leg Iodoflex Secondary  Dressing Wound #1 Left,Anterior Lower Leg Dry Gauze ABD pad Edema Control 3 Layer Compression System - Left Lower Extremity Avoid standing for long periods of time Elevate legs to the level of the heart or above for 30 minutes daily and/or when sitting, a frequency of: - throughout the day Electronic Signature(s) Signed: 08/04/2019 5:44:18 PM By: Levan Hurst RN, BSN Signed: 08/05/2019 10:32:20 AM By: Linton Ham MD Entered By: Levan Hurst on 08/04/2019 11:35:30 -------------------------------------------------------------------------------- Problem List Details Patient Name: Date of Service: Sarah [name] 08/04/2019 11:15 AM Medical Record [id number] Patient Account Number: [id number] Date of Birth/Sex: Treating RN: [birth year] (86 y.o. Lindee [name] Primary Care Provider: Patrina Levering Other Clinician: Referring Provider: Treating Provider/Extender:Shazia Mitchener, [name], Junius Creamer in Treatment: 11 Active Problems ICD-10 Evaluated Encounter Code Description Active Date Today Diagnosis L97.828 Non-pressure chronic ulcer of other part of left lower 05/19/2019 No Yes leg with other specified severity I87.312 Chronic venous hypertension (idiopathic) with ulcer of 05/19/2019 No Yes left lower extremity C86.5 Angioimmunoblastic T-cell lymphoma 05/19/2019 No Yes Inactive Problems Resolved Problems Electronic Signature(s) Signed: 08/05/2019 10:32:20 AM By: Linton Ham MD Entered By: Linton Ham on 08/04/2019 12:19:05 -------------------------------------------------------------------------------- Progress Note Details Patient Name: Date of Service: Sarah [name] 08/04/2019 11:15 AM Medical Record [id number] Patient Account Number: [id number] Date of Birth/Sex: Treating RN: Apr 09, 1933 (86 y.o. Jennell [name] Primary Care Provider: Patrina Levering Other Clinician: Referring Provider:  Treating Provider/Extender:Janai Maudlin, [name], Hal T Weeks in Treatment: 11 Subjective History of Present Illness (HPI) ADMISSION 05/19/2019 This is a an 86 year old woman who is referred from her oncologist Dr.Kale for review of a wound on her left anterior mid tibial area. although there are certain parts of the notes that accompany her and also in [place] link that suggests she had a wound on the right anterior mid tibia, the patient is fairly insistent that the current wound is the only wound she is ever had. She relates that this began as a small area on her left anterior mid tibia sometime in August i.e. clearly before the diagnosis or treatment of her lymphoma began. She said that has been gradually increasing in size over time she is completely unaware of any other precipitating factors such as trauma etc. She does have what looks to be changes of chronic venous insufficiency in her legs with some degree of hemosiderin deposition and edema but has never had problems with this before. The wound itself is exceptionally painful there is no drainage she has been using Bactroban on this twice a day with the help of her daughter. She was given a course of doxycycline last week by Dr. Donzetta Matters which she is tolerating well She also in September developed a widespread rash that was pruritic. She was seen by her primary doctor and dermatologist I believe. Ultimately she had a CT scan in early October that documented widespread adenopathy with splenomegaly.Marland Kitchen She was hospitalized from 03/06/2019 through 03/08/2019 presenting with shortness of breath leg swelling on the left. She was diagnosed with a pulmonary embolism with a DVT in the left leg this predominantly involve the left posterior tibial veins and the left gastrocnemius veins. She is on Eliquis since then. The patient was ultimately diagnosed with angioimmunoblastic T-cell lymphoma and she has been on chemotherapy  including Adriamycin Cytoxan and brentuximab. She was receiving a course of this every 3 weeks. She stated that the first round of chemotherapy caused her rash to go away. The lymphoma itself is widely spread including  a malignant pleural effusion. So far she is tolerating her chemotherapy well although this will stretch out into the beginning of March 2021 The patient's major complaint is the pain she has burning pain even at night that sometimes awakens her from sleep. Past medical history; lymphoma as noted above, breast CA with a lumpectomy in 2013, right total knee replacement, hypothyroidism, pulmonary embolism as described, DVT in the left leg also as described. ABI in our clinic was 1.26 on the left 1/11; patient arrives with the surface of this wound completely covered and adherent eschar. Her daughter was able to show me pictures on her cell phone that suggest that this was getting better however it certainly does not look like that today. She will not let me even get close to this for debridement. Even washing the surface of the wound off seems to be on her. 1/25; 2-week follow-up. In general the wound on the left anterior tibia looks better although there is still surface debris under illumination. There is no ability to do mechanical debridement on this the patient simply cannot stand it. Even washing this off with Anasept and gauze is prohibitive in terms of discomfort. We have been using Santyl and for this foreseeable future that we will need to continue. 2/8; 2-week follow-up. Unfortunately this does not look any better at all. Very tight adherent debris. She barely will let me get even close to this for examination let alone debridement. We have been using Santyl without effect. The exact cause of this wound is not clear. She had chemotherapy for underlying lymphoma although the wound seems to have occurred before this. I change the primary dressing to Iodoflex with border  foam and using her own stocking for edema control. The edema looks better today 2/22; 2-week follow-up. Slightly improved but not in terms of surface area. She did not tolerate the Iodoflex for the next week and she went back to the [place] but she has been using it for most of this past week. She has been using her compression stocking 3/1; we are using Iodoflex under wraps. I think her wound actually looks some better although there is still debris here. She tells [place] her lymphoma is in remission which is great news. She is on a short-term course of high-dose prednisone obviously not wonderful in terms of wound healing 3/8; we are using Iodoflex under compression. Wound bed looks a lot better although there is not much change in surface area. She will not allow mechanical debridement. She is taking the dressing off before she comes in here on a weekly basis and washing it out in the shower 3/15; using Iodoflex under compression the wound bed looks better slightly smaller. She has 2 rims of epithelialization Objective Constitutional Sitting or standing Blood Pressure is within target range for patient.. Pulse regular and within target range for patient.Marland Kitchen Respirations regular, non-labored and within target range.. Temperature is normal and within the target range for the patient.Marland Kitchen Appears in no distress. Vitals Time Taken: 11:23 AM, Height: 64 in, Weight: 111 lbs, BMI: 19.1, Temperature: 97.6 F, Pulse: 75 bpm, Respiratory Rate: 18 breaths/min, Blood Pressure: 123/74 mmHg. Cardiovascular Fetal pulses are palpable. Edema is well controlled. General Notes: Wound exam; the surface of the wound looks better she has much less debris satellite lesion is closed. Anasept and gauze as usual which she barely tolerates. No mechanical debridement. Integumentary (Hair, Skin) No evidence of infection around the wound. Wound #1 status is Open. Original cause of wound  was Gradually Appeared. The wound is  located on the Left,Anterior Lower Leg. The wound measures 1.5cm length x 1.2cm width x 0.1cm depth; 1.414cm^2 area and 0.141cm^3 volume. There is Fat Layer (Subcutaneous Tissue) Exposed exposed. There is no tunneling or undermining noted. There is a medium amount of serosanguineous drainage noted. The wound margin is flat and intact. There is medium (34-66%) pink granulation within the wound bed. There is a medium (34-66%) amount of necrotic tissue within the wound bed including Adherent Slough. Assessment Active Problems ICD-10 Non-pressure chronic ulcer of other part of left lower leg with other specified severity Chronic venous hypertension (idiopathic) with ulcer of left lower extremity Angioimmunoblastic T-cell lymphoma Procedures Wound #1 Pre-procedure diagnosis of Wound #1 is a Venous Leg Ulcer located on the Left,Anterior Lower Leg . There was a Three Layer Compression Therapy Procedure by Levan Hurst, RN. Post procedure Diagnosis Wound #1: Same as Pre-Procedure Plan Follow-up Appointments: Return Appointment in 1 week. Dressing Change Frequency: Wound #1 Left,Anterior Lower Leg: Do not change entire dressing for one week. Skin Barriers/Peri-Wound Care: Barrier cream Moisturizing lotion Wound Cleansing: Wound #1 Left,Anterior Lower Leg: May shower with protection. Primary Wound Dressing: Wound #1 Left,Anterior Lower Leg: Iodoflex Secondary Dressing: Wound #1 Left,Anterior Lower Leg: Dry Gauze ABD pad Edema Control: 3 Layer Compression System - Left Lower Extremity Avoid standing for long periods of time Elevate legs to the level of the heart or above for 30 minutes daily and/or when sitting, a frequency of: - throughout the day 1. At this point there seems no need to change the dressing which is Iodoflex 2. Could consider Hydrofera Blue if the wound stalls 3. 3 layer compression 4. She does not wish and would not tolerate mechanical debridement. She barely  tolerates debridement with Anasept and gauze Electronic Signature(s) Signed: 08/05/2019 10:32:20 AM By: Linton Ham MD Entered By: Linton Ham on 08/04/2019 12:32:09 -------------------------------------------------------------------------------- SuperBill Details Patient Name: Date of Service: Sarah [name] 08/04/2019 Medical Record [id number] Patient Account Number: [id number] Date of Birth/Sex: Treating RN: February 24, 1933 (86 y.o. Janiaya [name] Primary Care Provider: Lajean Manes T Other Clinician: Referring Provider: Treating Provider/Extender:Dmiya Malphrus, [name], Hal T Weeks in Treatment: 11 Diagnosis Coding ICD-10 Codes Code Description [phone] Non-pressure chronic ulcer of other part of left lower leg with other specified severity I87.312 Chronic venous hypertension (idiopathic) with ulcer of left lower extremity C86.5 Angioimmunoblastic T-cell lymphoma Facility Procedures CPT4 Code Description: [id number] (Facility Use Only) [phone] - [facility] LWR LT LEG Modifier: Quantity: 1 Physician Procedures CPT4 Code Description: [id number] - WC PHYS LEVEL 3 - EST PT ICD-10 Diagnosis Description L97.828 Non-pressure chronic ulcer of other part of left lower leg w severity I87.312 Chronic venous hypertension (idiopathic) with ulcer of left Modifier: ith other specifie lower extremity Quantity: 1 d Electronic Signature(s) Signed: 08/04/2019 5:44:18 PM By: Levan Hurst RN, BSN Signed: 08/05/2019 10:32:20 AM By: Linton Ham MD Entered By: Levan Hurst on 08/04/2019 13:55:55

## 2019-08-05 NOTE — Patient Instructions (Signed)
Tunneled Central Venous Catheter Flushing Guide  It is important to flush your tunneled central venous catheter each time you use it, both before and after you use it. Flushing your catheter will help prevent it from clogging. What are the risks? Risks may include:  Infection.  Air getting into the catheter and bloodstream. Supplies needed:  A clean pair of gloves.  A disinfecting wipe. Use an alcohol wipe, chlorhexidine wipe, or iodine wipe as told by your health care provider.  A 10 mL syringe that has been prefilled with saline solution.  An empty 10 mL syringe, if a substance called heparin was injected into your catheter. How to flush your catheter When you flush your catheter, make sure you follow any specific instructions from your health care provider or the manufacturer. These are general guidelines. Flushing your catheter before use If there is heparin in your catheter: 1. Wash your hands with soap and water. 2. Put on gloves. 3. Scrub the injection cap for a minimum of 15 seconds with a disinfecting wipe. 4. Unclamp the catheter. 5. Attach the empty syringe to the injection cap. 6. Pull the syringe plunger back and withdraw 10 mL of blood. 7. Place the syringe into an appropriate waste container. 8. Scrub the injection cap for 15 seconds with a disinfecting wipe. 9. Attach the prefilled syringe to the injection cap. 10. Flush the catheter by pushing the plunger forward until all the liquid from the syringe is in the catheter. 11. Remove the syringe from the injection cap. 12. Clamp the catheter. If there is no heparin in your catheter: 1. Wash your hands with soap and water. 2. Put on gloves. 3. Scrub the injection cap for 15 seconds with a disinfecting wipe. 4. Unclamp the catheter. 5. Attach the prefilled syringe to the injection cap. 6. Flush the catheter by pushing the plunger forward until 5 mL of the liquid from the syringe is in the catheter. 7. Pull back on  the syringe until you see blood in the catheter. 8. If you have been asked to collect any blood, follow your health care provider's instructions. Otherwise, flush the catheter with the rest of the solution from the syringe. 9. Remove the syringe from the injection cap. 10. Clamp the catheter.  Flushing your catheter after use 1. Wash your hands with soap and water. 2. Put on gloves. 3. Scrub the injection cap for 15 seconds with a disinfecting wipe. 4. Unclamp the catheter. 5. Attach the prefilled syringe to the injection cap. 6. Flush the catheter by pushing the plunger forward until all of the liquid from the syringe is in the catheter. 7. Remove the syringe from the injection cap. 8. Clamp the catheter. Problems and solutions  If blood cannot be completely cleared from the injection cap, you may need to have the injection cap replaced.  If the catheter is difficult to flush, use the pulsing method. The pulsing method involves pushing only a few milliliters of solution into the catheter at a time and pausing between pushes.  If you do not see blood in the catheter when you pull back on the syringe, change your body position, such as by raising your arms above your head. Take a deep breath and cough. Then, pull back on the syringe. If you still do not see blood, flush the catheter with a small amount of solution. Then, change positions again and take a breath or cough. Pull back on the syringe again. If you still do not see   blood, finish flushing the catheter and contact your health care provider. Do not use your catheter until your health care provider says it is okay. General tips  Have someone help you flush your catheter, if possible.  Do not force fluid through your catheter.  Do not use a syringe that is larger or smaller than 10 mL. Using a smaller syringe can make the catheter burst.  Do not use your catheter without flushing it first if it has heparin in it. Contact a health  care provider if:  You cannot see any blood in the catheter when you flush it before using it.  Your catheter is difficult to flush. Get help right away if:  You cannot flush the catheter.  The catheter leaks when you flush it or when there is fluid in it.  There are cracks or breaks in the catheter. Summary  It is important to flush your tunneled central venous catheter each time you use it, both before and after you use it.  Scrub the injection cap for 15 seconds with a disinfecting wipe before and after you flush it.  When you flush your catheter, make sure you follow any specific instructions from your health care provider or the manufacturer.  Get help right away if you cannot flush the catheter. This information is not intended to replace advice given to you by your health care provider. Make sure you discuss any questions you have with your health care provider. Document Revised: 01/31/2019 Document Reviewed: 07/24/2018 Elsevier Patient Education  2020 [employer]  

## 2019-08-05 NOTE — Progress Notes (Signed)
Pt C/O R leg being very "jerky" since benadryl administration.  Shelia Media PA informed, Ativan order received.

## 2019-08-06 DIAGNOSIS — L821 Other seborrheic keratosis: Secondary | ICD-10-CM | POA: Diagnosis not present

## 2019-08-06 DIAGNOSIS — L570 Actinic keratosis: Secondary | ICD-10-CM | POA: Diagnosis not present

## 2019-08-07 ENCOUNTER — Telehealth: Payer: Self-pay | Admitting: Hematology

## 2019-08-07 NOTE — Telephone Encounter (Signed)
Scheduled per 03/16 los, spoke with patient's daughter and patient should be notified of appointment.

## 2019-08-11 ENCOUNTER — Encounter (HOSPITAL_BASED_OUTPATIENT_CLINIC_OR_DEPARTMENT_OTHER): Payer: Medicare Other | Admitting: Internal Medicine

## 2019-08-11 ENCOUNTER — Other Ambulatory Visit: Payer: Self-pay

## 2019-08-11 DIAGNOSIS — I87312 Chronic venous hypertension (idiopathic) with ulcer of left lower extremity: Secondary | ICD-10-CM | POA: Diagnosis not present

## 2019-08-11 DIAGNOSIS — E039 Hypothyroidism, unspecified: Secondary | ICD-10-CM | POA: Diagnosis not present

## 2019-08-11 DIAGNOSIS — C865 Angioimmunoblastic T-cell lymphoma: Secondary | ICD-10-CM | POA: Diagnosis not present

## 2019-08-11 DIAGNOSIS — Z7901 Long term (current) use of anticoagulants: Secondary | ICD-10-CM | POA: Diagnosis not present

## 2019-08-11 DIAGNOSIS — L97828 Non-pressure chronic ulcer of other part of left lower leg with other specified severity: Secondary | ICD-10-CM | POA: Diagnosis not present

## 2019-08-11 DIAGNOSIS — I10 Essential (primary) hypertension: Secondary | ICD-10-CM | POA: Diagnosis not present

## 2019-08-11 NOTE — Progress Notes (Signed)
Sarah [name], Sarah [name] ([id number]) Visit Report for 08/11/2019 Arrival Information Details Patient Name: Date of Service: Sarah [name], Sarah [name] 08/11/2019 1:30 PM Medical Record [id number] Patient Account Number: [id number] Date of Birth/Sex: Treating RN: 1933/02/28 (86 y.o. Sarah [name], Meta.Reding Primary Care Mayson Mcneish: Lajean Manes T Other Clinician: Referring Merrick Maggio: Treating Astraea Gaughran/Extender:Robson, [name], Hal T Weeks in Treatment: 12 Visit Information History Since Last Visit Added or deleted any medications: No Patient Arrived: Ambulatory Any new allergies or adverse reactions: No Arrival Time: 14:15 Had a fall or experienced change in No Accompanied By: daughter activities of daily living that may affect Transfer Assistance: None risk of falls: Patient Identification Verified: Yes Signs or symptoms of abuse/neglect since last No Secondary Verification Process Completed: Yes visito Patient Requires Transmission-Based No Hospitalized since last visit: No Precautions: Implantable device outside of the clinic excluding No Patient Has Alerts: No cellular tissue based products placed in the center since last visit: Has Dressing in Place as Prescribed: Yes Has Compression in Place as Prescribed: No Pain Present Now: No Electronic Signature(s) Signed: 08/11/2019 5:27:21 PM By: Deon Pilling Entered By: Deon Pilling on 08/11/2019 14:17:58 -------------------------------------------------------------------------------- Compression Therapy Details Patient Name: Date of Service: Sarah [name], Sarah [name] 08/11/2019 1:30 PM Medical Record [id number] Patient Account Number: [id number] Date of Birth/Sex: Treating RN: 26-Nov-1932 (86 y.o. Sarah [name] Primary Care Eltha Tingley: Patrina Levering Other Clinician: Referring Gunhild Bautch: Treating Jadian Karman/Extender:Robson, [name], Hal T Weeks in Treatment: 12 Compression Therapy Performed for Wound Wound #1  Left,Anterior Lower Leg Assessment: Performed By: Clinician Levan Hurst, RN Compression Type: Three Layer Post Procedure Diagnosis Same as Pre-procedure Electronic Signature(s) Signed: 08/11/2019 5:18:21 PM By: Levan Hurst RN, BSN Entered By: Levan Hurst on 08/11/2019 15:12:23 -------------------------------------------------------------------------------- Encounter Discharge Information Details Patient Name: Date of Service: Sarah [name] 08/11/2019 1:30 PM Medical Record [id number] Patient Account Number: [id number] Date of Birth/Sex: Treating RN: 10-26-1932 (86 y.o. Sarah [name] Primary Care Byrne Capek: Patrina Levering Other Clinician: Referring Rafeal Skibicki: Treating Kinsleigh Ludolph/Extender:Robson, [name], Junius Creamer in Treatment: 12 Encounter Discharge Information Items Discharge Condition: Stable Ambulatory Status: Ambulatory Discharge Destination: Home Transportation: Private Auto Accompanied By: daughter Schedule Follow-up Appointment: Yes Clinical Summary of Care: Electronic Signature(s) Signed: 08/11/2019 5:27:21 PM By: Deon Pilling Entered By: Deon Pilling on 08/11/2019 16:03:12 -------------------------------------------------------------------------------- Lower Extremity Assessment Details Patient Name: Date of Service: Sarah [name], Sarah [name] 08/11/2019 1:30 PM Medical Record [id number] Patient Account Number: [id number] Date of Birth/Sex: Treating RN: [birth year] (86 y.o. Sarah [name] Primary Care Jaquail Mclees: Lajean Manes T Other Clinician: Referring Diane Hanel: Treating Jwan Hornbaker/Extender:Robson, [name], Hal T Weeks in Treatment: 12 Edema Assessment Assessed: [Left: Yes] [Right: No] Edema: [Left: Ye] [Right: s] Calf Left: Right: Point of Measurement: 36 cm From Medial Instep 27 cm cm Ankle Left: Right: Point of Measurement: 9 cm From Medial Instep 18.5 cm cm Vascular Assessment Pulses: Dorsalis Pedis Palpable:  [Left:Yes] Electronic Signature(s) Signed: 08/11/2019 5:27:21 PM By: Deon Pilling Entered By: Deon Pilling on 08/11/2019 14:18:32 -------------------------------------------------------------------------------- Multi Wound Chart Details Patient Name: Date of Service: Sarah [name] 08/11/2019 1:30 PM Medical Record [id number] Patient Account Number: [id number] Date of Birth/Sex: Treating RN: 09-11-1932 (86 y.o. Sarah [name] Primary Care Saffron Busey: Patrina Levering Other Clinician: Referring Micala Saltsman: Treating Axyl Sitzman/Extender:Robson, [name], Hal T Weeks in Treatment: 12 Vital Signs Height(in): 64 Pulse(bpm): 72 Weight(lbs): 111 Blood Pressure(mmHg): 115/62 Body Mass Index(BMI): 19 Temperature(F): 97.8 Respiratory 18 Rate(breaths/min): Photos: [1:No Photos] [N/A:N/A] Wound Location: [1:Left Lower Leg - Anterior] [N/A:N/A] Wounding Event: [1:Gradually Appeared] [N/A:N/A] Primary  Etiology: [1:Venous Leg Ulcer] [N/A:N/A] Comorbid History: [1:Hypertension, Received Chemotherapy, Received Radiation] [N/A:N/A] Date Acquired: [1:12/21/2018] [N/A:N/A] Weeks of Treatment: [1:12] [N/A:N/A] Wound Status: [1:Open] [N/A:N/A] Measurements L x W x D 0.8x0.7x0.1 [N/A:N/A] (cm) Area (cm) : [1:0.44] [N/A:N/A] Volume (cm) : [1:0.044] [N/A:N/A] % Reduction in Area: [1:84.40%] [N/A:N/A] % Reduction in Volume: 94.80% [N/A:N/A] Classification: [1:Full Thickness Without Exposed Support Structures] [N/A:N/A] Exudate Amount: [1:Medium] [N/A:N/A] Exudate Type: [1:Serosanguineous] [N/A:N/A] Exudate Color: [1:red, brown] [N/A:N/A] Wound Margin: [1:Flat and Intact] [N/A:N/A] Granulation Amount: [1:Large (67-100%)] [N/A:N/A] Granulation Quality: [1:Red] [N/A:N/A] Necrotic Amount: [1:None Present (0%)] [N/A:N/A] Exposed Structures: [1:Fat Layer (Subcutaneous N/A Tissue) Exposed: Yes Fascia: No Tendon: No Muscle: No Joint: No Bone: No] Epithelialization: [1:Large (67-100%)  Compression Therapy] [N/A:N/A N/A] Treatment Notes Electronic Signature(s) Signed: 08/11/2019 5:18:21 PM By: Levan Hurst RN, BSN Signed: 08/11/2019 5:34:39 PM By: Linton Ham MD Entered By: Linton Ham on 08/11/2019 15:43:00 -------------------------------------------------------------------------------- Multi-Disciplinary Care Plan Details Patient Name: Date of Service: Sarah [name] 08/11/2019 1:30 PM Medical Record [id number] Patient Account Number: [id number] Date of Birth/Sex: Treating RN: 26-Dec-1932 (86 y.o. Sarah [name] Primary Care Glendia Olshefski: Patrina Levering Other Clinician: Referring Lavin Petteway: Treating Mattthew Ziomek/Extender:Robson, [name], Hal T Weeks in Treatment: 12 Active Inactive Wound/Skin Impairment Nursing Diagnoses: Impaired tissue integrity Knowledge deficit related to ulceration/compromised skin integrity Goals: Patient/caregiver will verbalize understanding of skin care regimen Date Initiated: 05/19/2019 Target Resolution Date: 09/12/2019 Goal Status: Active Ulcer/skin breakdown will have a volume reduction of 30% by week 4 Date Initiated: 05/19/2019 Date Inactivated: 06/16/2019 Target Resolution Date: 06/20/2019 Goal Status: Met Ulcer/skin breakdown will have a volume reduction of 50% by week 8 Date Initiated: 06/16/2019 Date Inactivated: 07/14/2019 Target Resolution Date: 07/18/2019 Unmet Reason: necrotic Goal Status: Unmet surface, pt will not allow sharp debridement Interventions: Assess patient/caregiver ability to obtain necessary supplies Assess patient/caregiver ability to perform ulcer/skin care regimen upon admission and as needed Assess ulceration(s) every visit Provide education on ulcer and skin care Notes: Electronic Signature(s) Signed: 08/11/2019 5:18:21 PM By: Levan Hurst RN, BSN Entered By: Levan Hurst on 08/11/2019  15:12:42 -------------------------------------------------------------------------------- Pain Assessment Details Patient Name: Date of Service: Sarah [name] 08/11/2019 1:30 PM Medical Record [id number] Patient Account Number: [id number] Date of Birth/Sex: Treating RN: 1932-11-03 (86 y.o. Sarah [name] Primary Care Stephaun Million: Patrina Levering Other Clinician: Referring Kinzey Sheriff: Treating Otto Felkins/Extender:Robson, [name], Hal T Weeks in Treatment: 12 Active Problems Location of Pain Severity and Description of Pain Patient Has Paino No Site Locations Rate the pain. Current Pain Level: 0 Pain Management and Medication Current Pain Management: Medication: No Cold Application: No Rest: No Massage: No Activity: No T.E.N.S.: No Heat Application: No Leg drop or elevation: No Is the Current Pain Management Adequate: Adequate How does your wound impact your activities of daily livingo Sleep: No Bathing: No Appetite: No Relationship With Others: No Bladder Continence: No Emotions: No Bowel Continence: No Work: No Toileting: No Drive: No Dressing: No Hobbies: No Electronic Signature(s) Signed: 08/11/2019 5:27:21 PM By: Deon Pilling Entered By: Deon Pilling on 08/11/2019 14:18:22 -------------------------------------------------------------------------------- Patient/Caregiver Education Details Patient Name: Date of Service: Sarah [name] 3/22/2021andnbsp1:30 PM Medical Record [id number] Patient Account Number: [id number] Date of Birth/Gender: Treating RN: 03/10/1933 (86 y.o. Sarah [name] Primary Care Physician: Patrina Levering Other Clinician: Referring Physician: Treating Physician/Extender:Robson, [name], Junius Creamer in Treatment: 12 Education Assessment Education Provided To: Patient Education Topics Provided Wound/Skin Impairment: Methods: Explain/Verbal Responses: State content correctly [employer]) Signed: 08/11/2019 5:18:21 PM By: Levan Hurst RN,  BSN Entered By: Levan Hurst on 08/11/2019 15:13:00 -------------------------------------------------------------------------------- Wound Assessment Details Patient Name: Date of Service: Sarah [name], Sarah [name] 08/11/2019 1:30 PM Medical Record [id number] Patient Account Number: [id number] Date of Birth/Sex: Treating RN: [birth year] (86 y.o. Sarah [name] Primary Care Adolpho Meenach: Lajean Manes T Other Clinician: Referring Krishang Reading: Treating Chayah Mckee/Extender:Robson, [name], Hal T Weeks in Treatment: 12 Wound Status Wound Number: 1 Primary Venous Leg Ulcer Etiology: Wound Location: Left Lower Leg - Anterior Wound Open Wounding Event: Gradually Appeared Status: Date Acquired: 12/21/2018 Comorbid Hypertension, Received Chemotherapy, Weeks Of Treatment: 12 History: Received Radiation Clustered Wound: No Wound Measurements Length: (cm) 0.8 % Reduct Width: (cm) 0.7 % Reduct Depth: (cm) 0.1 Epitheli Area: (cm) 0.44 Tunneli Volume: (cm) 0.044 Undermi Wound Description Full Thickness Without Exposed Support Foul Odo Classification: Structures Slough/F Wound Flat and Intact Margin: Exudate Medium Amount: Exudate Serosanguineous Type: Exudate red, brown Color: Wound Bed Granulation Amount: Large (67-100%) Granulation Quality: Red Fascia E Necrotic Amount: None Present (0%) Fat Laye Tendon E Muscle E Joint Ex Bone Exp r After Cleansing: No ibrino Yes Exposed Structure xposed: No r (Subcutaneous Tissue) Exposed: Yes xposed: No xposed: No posed: No osed: No ion in Area: 84.4% ion in Volume: 94.8% alization: Large (67-100%) ng: No ning: No Treatment Notes Wound #1 (Left, Anterior Lower Leg) 1. Cleanse With Wound Cleanser Soap and water 2. Periwound Care Moisturizing lotion 3. Primary Dressing Applied Iodoflex 4. Secondary Dressing Dry Gauze 6. Support Layer Applied 3 layer  compression wrap Notes netting Electronic Signature(s) Signed: 08/11/2019 5:27:21 PM By: Deon Pilling Entered By: Deon Pilling on 08/11/2019 14:18:56 -------------------------------------------------------------------------------- Vitals Details Patient Name: Date of Service: Sarah [name] 08/11/2019 1:30 PM Medical Record [id number] Patient Account Number: [id number] Date of Birth/Sex: Treating RN: Oct 15, 1932 (86 y.o. Sarah [name] Primary Care Xaivier Malay: Lajean Manes T Other Clinician: Referring Dallon Dacosta: Treating Adelena Desantiago/Extender:Robson, [name], Hal T Weeks in Treatment: 12 Vital Signs Time Taken: 14:16 Temperature (F): 97.8 Height (in): 64 Pulse (bpm): 72 Weight (lbs): 111 Respiratory Rate (breaths/min): 18 Body Mass Index (BMI): 19.1 Blood Pressure (mmHg): 115/62 Reference Range: 80 - 120 mg / dl Electronic Signature(s) Signed: 08/11/2019 5:27:21 PM By: Deon Pilling Entered By: Deon Pilling on 08/11/2019 14:18:13

## 2019-08-11 NOTE — Progress Notes (Signed)
Sarah, [name] ([id number]) Visit Report for 08/11/2019 HPI Details Patient Name: Date of Service: Sarah, [name] 08/11/2019 1:30 PM Medical Record [id number] Patient Account Number: [id number] Date of Birth/Sex: Treating RN: 1932/07/18 (86 y.o. Sarah [name] Primary Care Provider: Patrina Levering Other Clinician: Referring Provider: Treating Provider/Extender:Kekoa Fyock, [name], Hal T Weeks in Treatment: 12 History of Present Illness HPI Description: ADMISSION 05/19/2019 This is a an 86 year old woman who is referred from her oncologist Dr.Kale for review of a wound on her left anterior mid tibial area. although there are certain parts of the notes that accompany her and also in  link that suggests she had a wound on the right anterior mid tibia, the patient is fairly insistent that the current wound is the only wound she is ever had. She relates that this began as a small area on her left anterior mid tibia sometime in August i.e. clearly before the diagnosis or treatment of her lymphoma began. She said that has been gradually increasing in size over time she is completely unaware of any other precipitating factors such as trauma etc. She does have what looks to be changes of chronic venous insufficiency in her legs with some degree of hemosiderin deposition and edema but has never had problems with this before. The wound itself is exceptionally painful there is no drainage she has been using Bactroban on this twice a day with the help of her daughter. She was given a course of doxycycline last week by Dr. Donzetta Matters which she is tolerating well She also in September developed a widespread rash that was pruritic. She was seen by her primary doctor and dermatologist I believe. Ultimately she had a CT scan in early October that documented widespread adenopathy with splenomegaly.Marland Kitchen She was hospitalized from 03/06/2019 through 03/08/2019 presenting with shortness  of breath leg swelling on the left. She was diagnosed with a pulmonary embolism with a DVT in the left leg this predominantly involve the left posterior tibial veins and the left gastrocnemius veins. She is on Eliquis since then. The patient was ultimately diagnosed with angioimmunoblastic T-cell lymphoma and she has been on chemotherapy including Adriamycin Cytoxan and brentuximab. She was receiving a course of this every 3 weeks. She stated that the first round of chemotherapy caused her rash to go away. 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 So far she is tolerating her chemotherapy well although this will stretch out into the beginning of March 2021 The patient's major complaint is the pain she has burning pain even at night that sometimes awakens her from sleep. Past medical history; lymphoma as noted above, breast CA with a lumpectomy in 2013, right total knee replacement, hypothyroidism, pulmonary embolism as described, DVT in the left leg also as described. ABI in our clinic was 1.26 on the left 1/11; patient arrives with the surface of this wound completely covered and adherent eschar. Her daughter was able to show me pictures on her cell phone that suggest that this was getting better however it certainly does not look like that today. She will not let me even get close to this for debridement. Even washing the surface of the wound off seems to be on her. 1/25; 2-week follow-up. In general the wound on the left anterior tibia looks better although there is still surface debris under illumination. There is no ability to do mechanical debridement on this the patient simply cannot stand it. Even washing this off with Anasept and gauze  is prohibitive in terms of discomfort. We have been using Santyl and for this foreseeable future that we will need to continue. 2/8; 2-week follow-up. Unfortunately this does not look any better at all. Very tight  adherent debris. She barely will let me get even close to this for examination let alone debridement. We have been using Santyl without effect. The exact cause of this wound is not clear. She had chemotherapy for underlying lymphoma although the wound seems to have occurred before this. I change the primary dressing to Iodoflex with border foam and using her own stocking for edema control. The edema looks better today 2/22; 2-week follow-up. Slightly improved but not in terms of surface area. She did not tolerate the Iodoflex for the next week and she went back to the [place] but she has been using it for most of this past week. She has been using her compression stocking 3/1; we are using Iodoflex under wraps. I think her wound actually looks some better although there is still debris here. She tells [place] her lymphoma is in remission which is great news. She is on a short-term course of high-dose prednisone obviously not wonderful in terms of wound healing 3/8; we are using Iodoflex under compression. Wound bed looks a lot better although there is not much change in surface area. She will not allow mechanical debridement. She is taking the dressing off before she comes in here on a weekly basis and washing it out in the shower 3/15; using Iodoflex under compression the wound bed looks better slightly smaller. She has 2 rims of epithelialization 3/22; we are using Iodoflex. Surface of the wound on the left lateral calf a lot better. Surface area also better. We are putting this under compression. She will not allow mechanical debridement but we have been able to get a fairly decent debridement with Anasept and gauze Electronic Signature(s) Signed: 08/11/2019 5:34:39 PM By: Linton Ham MD Entered By: Linton Ham on 08/11/2019 15:44:16 -------------------------------------------------------------------------------- Physical Exam Details Patient Name: Date of Service: Sarah [name]  08/11/2019 1:30 PM Medical Record [id number] Patient Account Number: [id number] Date of Birth/Sex: Treating RN: 07/15/1932 (86 y.o. Sarah [name] Primary Care Provider: Patrina Levering Other Clinician: Referring Provider: Treating Provider/Extender:Georga Stys, [name], Hal T Weeks in Treatment: 12 Constitutional Sitting or standing Blood Pressure is within target range for patient.. Pulse regular and within target range for patient.Marland Kitchen Respirations regular, non-labored and within target range.. Temperature is normal and within the target range for the patient.Marland Kitchen Appears in no distress. Cardiovascular Pedal pulses are palpable. We have good edema control. Integumentary (Hair, Skin) No erythema around the wound. Notes Wound exam; the surface of the wound looks a lot better and surprisingly today the dimensions of the wound are a lot better. Debrided with Anasept and gauze which is all she will allow. She barely tolerates this. Electronic Signature(s) Signed: 08/11/2019 5:34:39 PM By: Linton Ham MD Entered By: Linton Ham on 08/11/2019 15:45:20 -------------------------------------------------------------------------------- Physician Orders Details Patient Name: Date of Service: Sarah [name] 08/11/2019 1:30 PM Medical Record [id number] Patient Account Number: [id number] Date of Birth/Sex: Treating RN: Oct 02, 1932 (86 y.o. Sarah [name] Primary Care Provider: Lajean Manes T Other Clinician: Referring Provider: Treating Provider/Extender:Spero Gunnels, [name], Junius Creamer in Treatment: 12 Verbal / Phone Orders: No Diagnosis Coding ICD-10 Coding Code Description [phone] Non-pressure chronic ulcer of other part of left lower leg with other specified severity I87.312 Chronic venous hypertension (idiopathic) with ulcer of left lower extremity C86.5  Angioimmunoblastic T-cell lymphoma Follow-up Appointments Return Appointment in 1  week. Dressing Change Frequency Wound #1 Left,Anterior Lower Leg Do not change entire dressing for one week. Skin Barriers/Peri-Wound Care Barrier cream Moisturizing lotion Wound Cleansing Wound #1 Left,Anterior Lower Leg May shower with protection. Primary Wound Dressing Wound #1 Left,Anterior Lower Leg Iodoflex Secondary Dressing Wound #1 Left,Anterior Lower Leg Dry Gauze ABD pad Edema Control 3 Layer Compression System - Left Lower Extremity Avoid standing for long periods of time Elevate legs to the level of the heart or above for 30 minutes daily and/or when sitting, a frequency of: - throughout the day Electronic Signature(s) Signed: 08/11/2019 5:18:21 PM By: Levan Hurst RN, BSN Signed: 08/11/2019 5:34:39 PM By: Linton Ham MD Entered By: Levan Hurst on 08/11/2019 15:11:39 -------------------------------------------------------------------------------- Problem List Details Patient Name: Date of Service: Sarah [name] 08/11/2019 1:30 PM Medical Record [id number] Patient Account Number: [id number] Date of Birth/Sex: Treating RN: [birth year] (86 y.o. Sarah [name] Primary Care Provider: Patrina Levering Other Clinician: Referring Provider: Treating Provider/Extender:Acire Tang, [name], Junius Creamer in Treatment: 12 Active Problems ICD-10 Evaluated Encounter Code Description Active Date Today Diagnosis L97.828 Non-pressure chronic ulcer of other part of left lower 05/19/2019 No Yes leg with other specified severity I87.312 Chronic venous hypertension (idiopathic) with ulcer of 05/19/2019 No Yes left lower extremity C86.5 Angioimmunoblastic T-cell lymphoma 05/19/2019 No Yes Inactive Problems Resolved Problems Electronic Signature(s) Signed: 08/11/2019 5:34:39 PM By: Linton Ham MD Entered By: Linton Ham on 08/11/2019 15:42:29 -------------------------------------------------------------------------------- Progress Note  Details Patient Name: Date of Service: Sarah [name] 08/11/2019 1:30 PM Medical Record [id number] Patient Account Number: [id number] Date of Birth/Sex: Treating RN: 09-14-1932 (86 y.o. Sarah [name] Primary Care Provider: Patrina Levering Other Clinician: Referring Provider: Treating Provider/Extender:Leland Staszewski, [name], Hal T Weeks in Treatment: 12 Subjective History of Present Illness (HPI) ADMISSION 05/19/2019 This is a an 86 year old woman who is referred from her oncologist Dr.Kale for review of a wound on her left anterior mid tibial area. although there are certain parts of the notes that accompany her and also in [facility] link that suggests she had a wound on the right anterior mid tibia, the patient is fairly insistent that the current wound is the only wound she is ever had. She relates that this began as a small area on her left anterior mid tibia sometime in August i.e. clearly before the diagnosis or treatment of her lymphoma began. She said that has been gradually increasing in size over time she is completely unaware of any other precipitating factors such as trauma etc. She does have what looks to be changes of chronic venous insufficiency in her legs with some degree of hemosiderin deposition and edema but has never had problems with this before. The wound itself is exceptionally painful there is no drainage she has been using Bactroban on this twice a day with the help of her daughter. She was given a course of doxycycline last week by Dr. Donzetta Matters which she is tolerating well She also in September developed a widespread rash that was pruritic. She was seen by her primary doctor and dermatologist I believe. Ultimately she had a CT scan in early October that documented widespread adenopathy with splenomegaly.Marland Kitchen She was hospitalized from 03/06/2019 through 03/08/2019 presenting with shortness of breath leg swelling on the left. She was diagnosed with a  pulmonary embolism with a DVT in the left leg this predominantly involve the left posterior tibial veins and the left gastrocnemius veins. She is  on Eliquis since then. The patient was ultimately diagnosed with angioimmunoblastic T-cell lymphoma and she has been on chemotherapy including Adriamycin Cytoxan and brentuximab. She was receiving a course of this every 3 weeks. She stated that the first round of chemotherapy caused her rash to go away. 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 So far she is tolerating her chemotherapy well although this will stretch out into the beginning of March 2021 The patient's major complaint is the pain she has burning pain even at night that sometimes awakens her from sleep. Past medical history; lymphoma as noted above, breast CA with a lumpectomy in 2013, right total knee replacement, hypothyroidism, pulmonary embolism as described, DVT in the left leg also as described. ABI in our clinic was 1.26 on the left 1/11; patient arrives with the surface of this wound completely covered and adherent eschar. Her daughter was able to show me pictures on her cell phone that suggest that this was getting better however it certainly does not look like that today. She will not let me even get close to this for debridement. Even washing the surface of the wound off seems to be on her. 1/25; 2-week follow-up. In general the wound on the left anterior tibia looks better although there is still surface debris under illumination. There is no ability to do mechanical debridement on this the patient simply cannot stand it. Even washing this off with Anasept and gauze is prohibitive in terms of discomfort. We have been using Santyl and for this foreseeable future that we will need to continue. 2/8; 2-week follow-up. Unfortunately this does not look any better at all. Very tight adherent debris. She barely will let me get even close to this for  examination let alone debridement. We have been using Santyl without effect. The exact cause of this wound is not clear. She had chemotherapy for underlying lymphoma although the wound seems to have occurred before this. I change the primary dressing to Iodoflex with border foam and using her own stocking for edema control. The edema looks better today 2/22; 2-week follow-up. Slightly improved but not in terms of surface area. She did not tolerate the Iodoflex for the next week and she went back to the [place] but she has been using it for most of this past week. She has been using her compression stocking 3/1; we are using Iodoflex under wraps. I think her wound actually looks some better although there is still debris here. She tells [place] her lymphoma is in remission which is great news. She is on a short-term course of high-dose prednisone obviously not wonderful in terms of wound healing 3/8; we are using Iodoflex under compression. Wound bed looks a lot better although there is not much change in surface area. She will not allow mechanical debridement. She is taking the dressing off before she comes in here on a weekly basis and washing it out in the shower 3/15; using Iodoflex under compression the wound bed looks better slightly smaller. She has 2 rims of epithelialization 3/22; we are using Iodoflex. Surface of the wound on the left lateral calf a lot better. Surface area also better. We are putting this under compression. She will not allow mechanical debridement but we have been able to get a fairly decent debridement with Anasept and gauze Objective Constitutional Sitting or standing Blood Pressure is within target range for patient.. Pulse regular and within target range for patient.Marland Kitchen Respirations regular, non-labored and within target  range.. Temperature is normal and within the target range for the patient.Marland Kitchen Appears in no distress. Vitals Time Taken: 2:16 PM, Height: 64 in,  Weight: 111 lbs, BMI: 19.1, Temperature: 97.8 F, Pulse: 72 bpm, Respiratory Rate: 18 breaths/min, Blood Pressure: 115/62 mmHg. Cardiovascular Pedal pulses are palpable. We have good edema control. General Notes: Wound exam; the surface of the wound looks a lot better and surprisingly today the dimensions of the wound are a lot better. Debrided with Anasept and gauze which is all she will allow. She barely tolerates this. Integumentary (Hair, Skin) No erythema around the wound. Wound #1 status is Open. Original cause of wound was Gradually Appeared. The wound is located on the Left,Anterior Lower Leg. The wound measures 0.8cm length x 0.7cm width x 0.1cm depth; 0.44cm^2 area and 0.044cm^3 volume. There is Fat Layer (Subcutaneous Tissue) Exposed exposed. There is no tunneling or undermining noted. There is a medium amount of serosanguineous drainage noted. The wound margin is flat and intact. There is large (67-100%) red granulation within the wound bed. There is no necrotic tissue within the wound bed. Assessment Active Problems ICD-10 Non-pressure chronic ulcer of other part of left lower leg with other specified severity Chronic venous hypertension (idiopathic) with ulcer of left lower extremity Angioimmunoblastic T-cell lymphoma Procedures Wound #1 Pre-procedure diagnosis of Wound #1 is a Venous Leg Ulcer located on the Left,Anterior Lower Leg . There was a Three Layer Compression Therapy Procedure by Levan Hurst, RN. Post procedure Diagnosis Wound #1: Same as Pre-Procedure Plan Follow-up Appointments: Return Appointment in 1 week. Dressing Change Frequency: Wound #1 Left,Anterior Lower Leg: Do not change entire dressing for one week. Skin Barriers/Peri-Wound Care: Barrier cream Moisturizing lotion Wound Cleansing: Wound #1 Left,Anterior Lower Leg: May shower with protection. Primary Wound Dressing: Wound #1 Left,Anterior Lower Leg: Iodoflex Secondary Dressing: Wound  #1 Left,Anterior Lower Leg: Dry Gauze ABD pad Edema Control: 3 Layer Compression System - Left Lower Extremity Avoid standing for long periods of time Elevate legs to the level of the heart or above for 30 minutes daily and/or when sitting, a frequency of: - throughout the day 1. We continued with the Iodoflex is the surface area of the wound seemed a lot better. Also condition of the wound surface is viable as I have seen this. 2. Continue with 3 layer compression Electronic Signature(s) Signed: 08/11/2019 5:34:39 PM By: Linton Ham MD Entered By: Linton Ham on 08/11/2019 15:46:02 -------------------------------------------------------------------------------- SuperBill Details Patient Name: Date of Service: Sarah [name] 08/11/2019 Medical Record [id number] Patient Account Number: [id number] Date of Birth/Sex: Treating RN: Sep 22, 1932 (86 y.o. Sarah [name] Primary Care Provider: Lajean Manes T Other Clinician: Referring Provider: Treating Provider/Extender:Coltan Spinello, [name], Hal T Weeks in Treatment: 12 Diagnosis Coding ICD-10 Codes Code Description [phone] Non-pressure chronic ulcer of other part of left lower leg with other specified severity I87.312 Chronic venous hypertension (idiopathic) with ulcer of left lower extremity C86.5 Angioimmunoblastic T-cell lymphoma Facility Procedures CPT4 Code Description: [id number] (Facility Use Only) [phone] - [facility] L[facility] LT LEG Modifier: Quantity: 1 Physician Procedures CPT4 Code Description: [id number] - WC PHYS LEVEL 3 - EST PT ICD-10 Diagnosis Description L97.828 Non-pressure chronic ulcer of other part of left lower leg w severity I87.312 Chronic venous hypertension (idiopathic) with ulcer of left Modifier: ith other specifie lower extremity Quantity: 1 d Electronic Signature(s) Signed: 08/11/2019 5:18:21 PM By: Levan Hurst RN, BSN Signed: 08/11/2019 5:34:39 PM By: Linton Ham  MD Entered By: Levan Hurst on 08/11/2019 17:07:19

## 2019-08-18 ENCOUNTER — Telehealth: Payer: Self-pay | Admitting: Critical Care Medicine

## 2019-08-18 ENCOUNTER — Encounter (HOSPITAL_BASED_OUTPATIENT_CLINIC_OR_DEPARTMENT_OTHER): Payer: Medicare Other | Admitting: Internal Medicine

## 2019-08-18 ENCOUNTER — Other Ambulatory Visit: Payer: Self-pay

## 2019-08-18 DIAGNOSIS — L97828 Non-pressure chronic ulcer of other part of left lower leg with other specified severity: Secondary | ICD-10-CM | POA: Diagnosis not present

## 2019-08-18 DIAGNOSIS — E039 Hypothyroidism, unspecified: Secondary | ICD-10-CM | POA: Diagnosis not present

## 2019-08-18 DIAGNOSIS — C865 Angioimmunoblastic T-cell lymphoma: Secondary | ICD-10-CM | POA: Diagnosis not present

## 2019-08-18 DIAGNOSIS — I87312 Chronic venous hypertension (idiopathic) with ulcer of left lower extremity: Secondary | ICD-10-CM | POA: Diagnosis not present

## 2019-08-18 DIAGNOSIS — L97822 Non-pressure chronic ulcer of other part of left lower leg with fat layer exposed: Secondary | ICD-10-CM | POA: Diagnosis not present

## 2019-08-18 DIAGNOSIS — Z7901 Long term (current) use of anticoagulants: Secondary | ICD-10-CM | POA: Diagnosis not present

## 2019-08-18 DIAGNOSIS — I10 Essential (primary) hypertension: Secondary | ICD-10-CM | POA: Diagnosis not present

## 2019-08-18 MED ORDER — APIXABAN 5 MG PO TABS: 5.0000 mg | ORAL_TABLET | Freq: Two times a day (BID) | ORAL | 0 refills | Status: DC

## 2019-08-18 NOTE — Telephone Encounter (Signed)
Rx for Eliquis was refilled.  I spoke with [name] and notified that this was done.

## 2019-08-18 NOTE — Progress Notes (Signed)
Sarah, [name] ([id number]) Visit Report for 08/18/2019 HPI Details Patient Name: Date of Service: Sarah [name], Sarah [name] 08/18/2019 9:30 AM Medical Record [id number] Patient Account Number: [id number] Date of Birth/Sex: Treating RN: 1933-02-16 (86 y.o. Sarah [name] Primary Care Provider: Patrina Levering Other Clinician: Referring Provider: Treating Provider/Extender:Ellamae Lybeck, [name], Hal T Weeks in Treatment: 13 History of Present Illness HPI Description: ADMISSION 05/19/2019 This is a an 86 year old woman who is referred from her oncologist Dr.Kale for review of a wound on her left anterior mid tibial area. although there are certain parts of the notes that accompany her and also in [place] link that suggests she had a wound on the right anterior mid tibia, the patient is fairly insistent that the current wound is the only wound she is ever had. She relates that this began as a small area on her left anterior mid tibia sometime in August i.e. clearly before the diagnosis or treatment of her lymphoma began. She said that has been gradually increasing in size over time she is completely unaware of any other precipitating factors such as trauma etc. She does have what looks to be changes of chronic venous insufficiency in her legs with some degree of hemosiderin deposition and edema but has never had problems with this before. The wound itself is exceptionally painful there is no drainage she has been using Bactroban on this twice a day with the help of her daughter. She was given a course of doxycycline last week by Dr. Donzetta Matters which she is tolerating well She also in September developed a widespread rash that was pruritic. She was seen by her primary doctor and dermatologist I believe. Ultimately she had a CT scan in early October that documented widespread adenopathy with splenomegaly.Marland Kitchen She was hospitalized from 03/06/2019 through 03/08/2019 presenting with shortness  of breath leg swelling on the left. She was diagnosed with a pulmonary embolism with a DVT in the left leg this predominantly involve the left posterior tibial veins and the left gastrocnemius veins. She is on Eliquis since then. The patient was ultimately diagnosed with angioimmunoblastic T-cell lymphoma and she has been on chemotherapy including Adriamycin Cytoxan and brentuximab. She was receiving a course of this every 3 weeks. She stated that the first round of chemotherapy caused her rash to go away. 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 So far she is tolerating her chemotherapy well although this will stretch out into the beginning of March 2021 The patient's major complaint is the pain she has burning pain even at night that sometimes awakens her from sleep. Past medical history; lymphoma as noted above, breast CA with a lumpectomy in 2013, right total knee replacement, hypothyroidism, pulmonary embolism as described, DVT in the left leg also as described. ABI in our clinic was 1.26 on the left 1/11; patient arrives with the surface of this wound completely covered and adherent eschar. Her daughter was able to show me pictures on her cell phone that suggest that this was getting better however it certainly does not look like that today. She will not let me even get close to this for debridement. Even washing the surface of the wound off seems to be on her. 1/25; 2-week follow-up. In general the wound on the left anterior tibia looks better although there is still surface debris under illumination. There is no ability to do mechanical debridement on this the patient simply cannot stand it. Even washing this off with Anasept and gauze  is prohibitive in terms of discomfort. We have been using Santyl and for this foreseeable future that we will need to continue. 2/8; 2-week follow-up. Unfortunately this does not look any better at all. Very tight  adherent debris. She barely will let me get even close to this for examination let alone debridement. We have been using Santyl without effect. The exact cause of this wound is not clear. She had chemotherapy for underlying lymphoma although the wound seems to have occurred before this. I change the primary dressing to Iodoflex with border foam and using her own stocking for edema control. The edema looks better today 2/22; 2-week follow-up. Slightly improved but not in terms of surface area. She did not tolerate the Iodoflex for the next week and she went back to the [place] but she has been using it for most of this past week. She has been using her compression stocking 3/1; we are using Iodoflex under wraps. I think her wound actually looks some better although there is still debris here. She tells [place] her lymphoma is in remission which is great news. She is on a short-term course of high-dose prednisone obviously not wonderful in terms of wound healing 3/8; we are using Iodoflex under compression. Wound bed looks a lot better although there is not much change in surface area. She will not allow mechanical debridement. She is taking the dressing off before she comes in here on a weekly basis and washing it out in the shower 3/15; using Iodoflex under compression the wound bed looks better slightly smaller. She has 2 rims of epithelialization 3/22; we are using Iodoflex. Surface of the wound on the left lateral calf a lot better. Surface area also better. We are putting this under compression. She will not allow mechanical debridement but we have been able to get a fairly decent debridement with Anasept and gauze 3/29; we have been using Iodoflex and this wound that was difficult has really contracted nicely in terms of surface area. We also put this under compression which I think is also helped Electronic Signature(s) Signed: 08/18/2019 5:31:36 PM By: Linton Ham MD Entered By: Linton Ham on 08/18/2019 11:17:48 -------------------------------------------------------------------------------- Physical Exam Details Patient Name: Date of Service: [name] 08/18/2019 9:30 AM Medical Record [id number] Patient Account Number: [id number] Date of Birth/Sex: Treating RN: 1932-11-26 (86 y.o. Nautica [name] Primary Care Provider: Patrina Levering Other Clinician: Referring Provider: Treating Provider/Extender:Joncarlo Friberg, [name], Hal T Weeks in Treatment: 13 Constitutional Sitting or standing Blood Pressure is within target range for patient.. Pulse regular and within target range for patient.Marland Kitchen Respirations regular, non-labored and within target range.. Temperature is normal and within the target range for the patient.Marland Kitchen Appears in no distress. Cardiovascular Pedal pulses were present. Have good edema control. Notes Wound exam; the surface of the wound looks a lot better and the dimensions continue to contract no mechanical debridement was necessary there is no erythema around the wound Electronic Signature(s) Signed: 08/18/2019 5:31:36 PM By: Linton Ham MD Entered By: Linton Ham on 08/18/2019 11:18:44 -------------------------------------------------------------------------------- Physician Orders Details Patient Name: Date of Service: [name] 08/18/2019 9:30 AM Medical Record [id number] Patient Account Number: [id number] Date of Birth/Sex: Treating RN: 05/07/1933 (86 y.o. Elam Dutch Primary Care Provider: Lajean Manes T Other Clinician: Referring Provider: Treating Provider/Extender:Corneshia Hines, [name], Hal T Weeks in Treatment: 34 Verbal / Phone Orders: No Diagnosis Coding ICD-10 Coding Code Description [phone] Non-pressure chronic ulcer of other part of left lower leg with  other specified severity I87.312 Chronic venous hypertension (idiopathic) with ulcer of left lower extremity C86.5  Angioimmunoblastic T-cell lymphoma Follow-up Appointments Return Appointment in 1 week. Dressing Change Frequency Wound #1 Left,Anterior Lower Leg Do not change entire dressing for one week. Skin Barriers/Peri-Wound Care Moisturizing lotion - to leg Wound Cleansing Wound #1 Left,Anterior Lower Leg May shower with protection. Primary Wound Dressing Wound #1 Left,Anterior Lower Leg Iodoflex Secondary Dressing Wound #1 Left,Anterior Lower Leg Dry Gauze Edema Control 3 Layer Compression System - Left Lower Extremity Avoid standing for long periods of time Elevate legs to the level of the heart or above for 30 minutes daily and/or when sitting, a frequency of: - throughout the day Exercise regularly Electronic Signature(s) Signed: 08/18/2019 5:31:36 PM By: Linton Ham MD Signed: 08/18/2019 5:31:53 PM By: Baruch Gouty RN, BSN Entered By: Baruch Gouty on 08/18/2019 10:51:03 -------------------------------------------------------------------------------- Problem List Details Patient Name: Date of Service: [name] 08/18/2019 9:30 AM Medical Record [id number] Patient Account Number: [id number] Date of Birth/Sex: Treating RN: 09-Jun-1932 (86 y.o. Elam Dutch Primary Care Provider: Lajean Manes T Other Clinician: Referring Provider: Treating Provider/Extender:Alaa Mullally, [name], Junius Creamer in Treatment: 13 Active Problems ICD-10 Evaluated Encounter Code Description Active Date Today Diagnosis L97.828 Non-pressure chronic ulcer of other part of left lower 05/19/2019 No Yes leg with other specified severity I87.312 Chronic venous hypertension (idiopathic) with ulcer of 05/19/2019 No Yes left lower extremity C86.5 Angioimmunoblastic T-cell lymphoma 05/19/2019 No Yes Inactive Problems Resolved Problems Electronic Signature(s) Signed: 08/18/2019 5:31:36 PM By: Linton Ham MD Entered By: Linton Ham on 08/18/2019  11:16:39 -------------------------------------------------------------------------------- Progress Note Details Patient Name: Date of Service: [name] 08/18/2019 9:30 AM Medical Record [id number] Patient Account Number: [id number] Date of Birth/Sex: Treating RN: 1932-12-05 (86 y.o. Dawsyn [name] Primary Care Provider: Patrina Levering Other Clinician: Referring Provider: Treating Provider/Extender:Keelon Zurn, [name], Hal T Weeks in Treatment: 13 Subjective History of Present Illness (HPI) ADMISSION 05/19/2019 This is a an 86 year old woman who is referred from her oncologist Dr.Kale for review of a wound on her left anterior mid tibial area. although there are certain parts of the notes that accompany her and also in [place] link that suggests she had a wound on the right anterior mid tibia, the patient is fairly insistent that the current wound is the only wound she is ever had. She relates that this began as a small area on her left anterior mid tibia sometime in August i.e. clearly before the diagnosis or treatment of her lymphoma began. She said that has been gradually increasing in size over time she is completely unaware of any other precipitating factors such as trauma etc. She does have what looks to be changes of chronic venous insufficiency in her legs with some degree of hemosiderin deposition and edema but has never had problems with this before. The wound itself is exceptionally painful there is no drainage she has been using Bactroban on this twice a day with the help of her daughter. She was given a course of doxycycline last week by Dr. Donzetta Matters which she is tolerating well She also in September developed a widespread rash that was pruritic. She was seen by her primary doctor and dermatologist I believe. Ultimately she had a CT scan in early October that documented widespread adenopathy with splenomegaly.Marland Kitchen She was hospitalized from 03/06/2019  through 03/08/2019 presenting with shortness of breath leg swelling on the left. She was diagnosed with a pulmonary embolism with a DVT in the left  leg this predominantly involve the left posterior tibial veins and the left gastrocnemius veins. She is on Eliquis since then. The patient was ultimately diagnosed with angioimmunoblastic T-cell lymphoma and she has been on chemotherapy including Adriamycin Cytoxan and brentuximab. She was receiving a course of this every 3 weeks. She stated that the first round of chemotherapy caused her rash to go away. 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 So far she is tolerating her chemotherapy well although this will stretch out into the beginning of March 2021 The patient's major complaint is the pain she has burning pain even at night that sometimes awakens her from sleep. Past medical history; lymphoma as noted above, breast CA with a lumpectomy in 2013, right total knee replacement, hypothyroidism, pulmonary embolism as described, DVT in the left leg also as described. ABI in our clinic was 1.26 on the left 1/11; patient arrives with the surface of this wound completely covered and adherent eschar. Her daughter was able to show me pictures on her cell phone that suggest that this was getting better however it certainly does not look like that today. She will not let me even get close to this for debridement. Even washing the surface of the wound off seems to be on her. 1/25; 2-week follow-up. In general the wound on the left anterior tibia looks better although there is still surface debris under illumination. There is no ability to do mechanical debridement on this the patient simply cannot stand it. Even washing this off with Anasept and gauze is prohibitive in terms of discomfort. We have been using Santyl and for this foreseeable future that we will need to continue. 2/8; 2-week follow-up. Unfortunately this does  not look any better at all. Very tight adherent debris. She barely will let me get even close to this for examination let alone debridement. We have been using Santyl without effect. The exact cause of this wound is not clear. She had chemotherapy for underlying lymphoma although the wound seems to have occurred before this. I change the primary dressing to Iodoflex with border foam and using her own stocking for edema control. The edema looks better today 2/22; 2-week follow-up. Slightly improved but not in terms of surface area. She did not tolerate the Iodoflex for the next week and she went back to the [place] but she has been using it for most of this past week. She has been using her compression stocking 3/1; we are using Iodoflex under wraps. I think her wound actually looks some better although there is still debris here. She tells [place] her lymphoma is in remission which is great news. She is on a short-term course of high-dose prednisone obviously not wonderful in terms of wound healing 3/8; we are using Iodoflex under compression. Wound bed looks a lot better although there is not much change in surface area. She will not allow mechanical debridement. She is taking the dressing off before she comes in here on a weekly basis and washing it out in the shower 3/15; using Iodoflex under compression the wound bed looks better slightly smaller. She has 2 rims of epithelialization 3/22; we are using Iodoflex. Surface of the wound on the left lateral calf a lot better. Surface area also better. We are putting this under compression. She will not allow mechanical debridement but we have been able to get a fairly decent debridement with Anasept and gauze 3/29; we have been using Iodoflex and this wound that was  difficult has really contracted nicely in terms of surface area. We also put this under compression which I think is also helped Objective Constitutional Sitting or standing Blood  Pressure is within target range for patient.. Pulse regular and within target range for patient.Marland Kitchen Respirations regular, non-labored and within target range.. Temperature is normal and within the target range for the patient.Marland Kitchen Appears in no distress. Vitals Time Taken: 9:40 AM, Height: 64 in, Weight: 111 lbs, BMI: 19.1, Temperature: 98 F, Pulse: 73 bpm, Respiratory Rate: 18 breaths/min, Blood Pressure: 120/75 mmHg. Cardiovascular Pedal pulses were present. Have good edema control. General Notes: Wound exam; the surface of the wound looks a lot better and the dimensions continue to contract no mechanical debridement was necessary there is no erythema around the wound Integumentary (Hair, Skin) Wound #1 status is Open. Original cause of wound was Gradually Appeared. The wound is located on the Left,Anterior Lower Leg. The wound measures 0.3cm length x 0.4cm width x 0.1cm depth; 0.094cm^2 area and 0.009cm^3 volume. There is Fat Layer (Subcutaneous Tissue) Exposed exposed. There is no tunneling or undermining noted. There is a medium amount of serosanguineous drainage noted. The wound margin is flat and intact. There is large (67-100%) red granulation within the wound bed. There is no necrotic tissue within the wound bed. Assessment Active Problems ICD-10 Non-pressure chronic ulcer of other part of left lower leg with other specified severity Chronic venous hypertension (idiopathic) with ulcer of left lower extremity Angioimmunoblastic T-cell lymphoma Procedures Wound #1 Pre-procedure diagnosis of Wound #1 is a Venous Leg Ulcer located on the Left,Anterior Lower Leg . There was a Three Layer Compression Therapy Procedure by Deon Pilling, RN. Post procedure Diagnosis Wound #1: Same as Pre-Procedure Plan Follow-up Appointments: Return Appointment in 1 week. Dressing Change Frequency: Wound #1 Left,Anterior Lower Leg: Do not change entire dressing for one week. Skin Barriers/Peri-Wound  Care: Moisturizing lotion - to leg Wound Cleansing: Wound #1 Left,Anterior Lower Leg: May shower with protection. Primary Wound Dressing: Wound #1 Left,Anterior Lower Leg: Iodoflex Secondary Dressing: Wound #1 Left,Anterior Lower Leg: Dry Gauze Edema Control: 3 Layer Compression System - Left Lower Extremity Avoid standing for long periods of time Elevate legs to the level of the heart or above for 30 minutes daily and/or when sitting, a frequency of: - throughout the day Exercise regularly 1. I am continuing with the Iodoflex under compression 2. Not only did this improve the surface of the wound but continued contraction and the surface area has been really gratifying 3. We will work on getting her 20/30 below-knee stockings in the eventuality that this will close over possibly by next week Electronic Signature(s) Signed: 08/18/2019 5:31:36 PM By: Linton Ham MD Entered By: Linton Ham on 08/18/2019 11:19:30 -------------------------------------------------------------------------------- SuperBill Details Patient Name: Date of Service: [name] 08/18/2019 Medical Record [id number] Patient Account Number: [id number] Date of Birth/Sex: Treating RN: 05-01-1933 (86 y.o. Elam Dutch Primary Care Provider: Lajean Manes T Other Clinician: Referring Provider: Treating Provider/Extender:Jashawn Floyd, [name], Hal T Weeks in Treatment: 13 Diagnosis Coding ICD-10 Codes Code Description [phone] Non-pressure chronic ulcer of other part of left lower leg with other specified severity I87.312 Chronic venous hypertension (idiopathic) with ulcer of left lower extremity C86.5 Angioimmunoblastic T-cell lymphoma Facility Procedures CPT4 Code Description: [id number] (Facility Use Only) [phone] - APPLY MULTLAY COMPRS LWR LT LEG Modifier: Quantity: 1 Physician Procedures CPT4 Code Description: [id number] - WC PHYS LEVEL 3 - EST PT ICD-10 Diagnosis  Description L97.828 Non-pressure chronic ulcer of other  part of left lower leg w severity I87.312 Chronic venous hypertension (idiopathic) with ulcer of left Modifier: ith other specifie lower extremity Quantity: 1 d Electronic Signature(s) Signed: 08/18/2019 5:31:36 PM By: Linton Ham MD Entered By: Linton Ham on 08/18/2019 11:20:02

## 2019-08-22 NOTE — Progress Notes (Signed)
JANEA, Sarah Sarah [name] ([id number]) Visit Report for 08/18/2019 Arrival Information Details Patient Name: Date of Service: Sarah Sarah [name], Sarah Sarah [name] 08/18/2019 9:30 AM Medical Record [id number] Patient Account Number: [id number] Date of Birth/Sex: Sarah Sarah [name] (86 y.o. Sarah Sarah [name] Primary Care Sarah Sarah [name]: Sarah Sarah [name] Other Clinician: Referring Taegen Delker: Sarah Sarah Sarah [name]/Extender:Sarah Sarah [name] [name] in Treatment: 13 Visit Information History Since Last Visit Added or deleted any medications: No Patient Arrived: Ambulatory Any new allergies or adverse reactions: No Arrival Time: 09:40 Had Sarah [name] fall or experienced change in No Accompanied By: daughter activities of daily living that may affect Transfer Assistance: None risk of falls: Patient Identification Verified: Yes Signs or symptoms of abuse/neglect since last No Secondary Verification Process Completed: Yes visito Patient Requires Transmission-Based No Hospitalized since last visit: No Precautions: Implantable device outside of the clinic excluding No Patient Has Alerts: No cellular tissue based products placed in the center since last visit: Has Dressing in Place as Prescribed: Yes Pain Present Now: Yes Electronic Signature(s) Signed: 08/18/2019 5:06:29 PM By: Sarah Sarah [name] Entered By: Sarah Sarah [name] on 08/18/2019 09:40:55 -------------------------------------------------------------------------------- Compression Therapy Details Patient Name: Date of Service: Sarah Sarah [name] 08/18/2019 9:30 AM Medical Record [id number] Patient Account Number: [id number] Date of Birth/Sex: Treating RN: Apr 07, 1933 (86 y.o. Sarah Sarah [name] Primary Care Sarah Sarah [name]: Sarah Sarah [name] Other Clinician: Referring Kalecia Hartney: Sarah Enos Muhl/Extender:Sarah Sarah [name] [name] in Treatment: 13 Compression Therapy Performed for Wound Wound #1 Left,Anterior Lower  Leg Assessment: Performed By: Clinician Sarah Pilling, RN Compression Type: Three Layer Post Procedure Diagnosis Same as Pre-procedure Electronic Signature(s) Signed: 08/18/2019 5:31:53 PM By: Sarah Gouty RN, BSN Entered By: Sarah Sarah [name] on 08/18/2019 10:49:18 -------------------------------------------------------------------------------- Encounter Discharge Information Details Patient Name: Date of Service: Sarah Sarah [name] 08/18/2019 9:30 AM Medical Record [id number] Patient Account Number: [id number] Date of Birth/Sex: Treating RN: 03/03/1933 (86 y.o. Sarah Sarah [name] Primary Care Elek Holderness: Sarah Sarah [name] Other Clinician: Referring Sarah Sarah [name]: Sarah Sarah Sarah [name]/Extender:Robson, Sarah Sarah [name], Sarah Sarah [name] in Treatment: 13 Encounter Discharge Information Items Discharge Condition: Stable Ambulatory Status: Ambulatory Discharge Destination: Home Transportation: Private Auto Accompanied By: daughter Schedule Follow-up Appointment: Yes Clinical Summary of Care: Electronic Signature(s) Signed: 08/18/2019 4:48:26 PM By: Sarah Sarah [name] Entered By: Sarah Sarah [name] on 08/18/2019 11:09:30 -------------------------------------------------------------------------------- Lower Extremity Assessment Details Patient Name: Date of Service: Sarah Sarah [name], Sarah Sarah [name] 08/18/2019 9:30 AM Medical Record [id number] Patient Account Number: [id number] Date of Birth/Sex: Treating RN: April 25, 1933 (86 y.o. Sarah Sarah [name] Primary Care Nikol Lemar: Sarah Sarah [name] Other Clinician: Referring Synia Douglass: Sarah Sarah Sarah [name]/Extender:Sarah Sarah [name] [name] in Treatment: 13 Edema Assessment Assessed: [Left: No] [Right: No] Edema: [Left: Ye] [Right: s] Calf Left: Right: Point of Measurement: 36 cm From Medial Instep 27.5 cm cm Ankle Left: Right: Point of Measurement: 9 cm From Medial Instep 18.5 cm cm Vascular Assessment Pulses: Dorsalis Pedis Palpable:  [Left:Yes] Electronic Signature(s) Signed: 08/18/2019 5:06:29 PM By: Sarah Sarah [name] Entered By: Sarah Sarah [name] on 08/18/2019 09:42:19 -------------------------------------------------------------------------------- Multi Wound Chart Details Patient Name: Date of Service: Sarah Sarah [name] 08/18/2019 9:30 AM Medical Record [id number] Patient Account Number: [id number] Date of Birth/Sex: Treating RN: 1932/12/12 (86 y.o. Sarah Sarah [name] Primary Care Jenson Beedle: Sarah Sarah [name] Other Clinician: Referring Sarah Sarah [name]: Sarah Sarah Sarah [name]/Extender:Sarah Sarah [name] [name] in Treatment: 13 Vital Signs Height(in): 64 Pulse(bpm): 73 Weight(lbs): 111 Blood Pressure(mmHg): 120/75 Body Mass Index(BMI): 19 Temperature(F): 98 Respiratory 18 Rate(breaths/min): Photos: [1:No Photos] [N/Sarah [name]:N/Sarah [name]] Wound Location: [1:Left Lower Leg - Anterior] [N/Sarah [name]:N/Sarah [name]] Wounding Event: [1:Gradually Appeared] [N/Sarah [name]:N/Sarah [name]] Primary Etiology: [1:Venous Leg Ulcer] [N/Sarah [name]:N/Sarah [name]] Comorbid History: [  1:Hypertension, Received Chemotherapy, Received Radiation] [N/Sarah [name]:N/Sarah [name]] Date Acquired: [1:12/21/2018] [N/Sarah [name]:N/Sarah [name]] [name] of Treatment: [1:13] [N/Sarah [name]:N/Sarah [name]] Wound Status: [1:Open] [N/Sarah [name]:N/Sarah [name]] Measurements L x W x D 0.3x0.4x0.1 [N/Sarah [name]:N/Sarah [name]] (cm) Area (cm) : [1:0.094] [N/Sarah [name]:N/Sarah [name]] Volume (cm) : [1:0.009] [N/Sarah [name]:N/Sarah [name]] % Reduction in Area: [1:96.70%] [N/Sarah [name]:N/Sarah [name]] % Reduction in Volume: 98.90% [N/Sarah [name]:N/Sarah [name]] Classification: [1:Full Thickness Without Exposed Support Structures] [N/Sarah [name]:N/Sarah [name]] Exudate Amount: [1:Medium] [N/Sarah [name]:N/Sarah [name]] Exudate Type: [1:Serosanguineous] [N/Sarah [name]:N/Sarah [name]] Exudate Color: [1:red, brown] [N/Sarah [name]:N/Sarah [name]] Wound Margin: [1:Flat and Intact] [N/Sarah [name]:N/Sarah [name]] Granulation Amount: [1:Large (67-100%)] [N/Sarah [name]:N/Sarah [name]] Granulation Quality: [1:Red] [N/Sarah [name]:N/Sarah [name]] Necrotic Amount: [1:None Present (0%)] [N/Sarah [name]:N/Sarah [name]] Exposed Structures: [1:Fat Layer (Subcutaneous N/Sarah [name] Tissue) Exposed: Yes Fascia: No Tendon: No Muscle: No Joint: No Bone: No] Epithelialization: [1:Large  (67-100%) Compression Therapy] [N/Sarah [name]:N/Sarah [name] N/Sarah [name]] Treatment Notes Wound #1 (Left, Anterior Lower Leg) 1. Cleanse With Wound Cleanser Soap and water 2. Periwound Care Moisturizing lotion 3. Primary Dressing Applied Iodoflex 4. Secondary Dressing Dry Gauze 6. Support Layer Applied 3 layer compression wrap Notes netting Electronic Signature(s) Signed: 08/18/2019 5:31:36 PM By: Linton Ham MD Signed: 08/22/2019 5:28:43 PM By: Levan Hurst RN, BSN Entered By: Linton Ham on 08/18/2019 11:16:49 -------------------------------------------------------------------------------- Multi-Disciplinary Care Plan Details Patient Name: Date of Service: Sarah Sarah [name], Sarah Sarah [name] 08/18/2019 9:30 AM Medical Record [id number] Patient Account Number: [id number] Date of Birth/Sex: Treating RN: 1933-01-01 (86 y.o. Sarah Sarah [name] Primary Care Sabian Kuba: Sarah Sarah [name] Other Clinician: Referring Clementina Mareno: Sarah Derriona Branscom/Extender:Sarah Sarah [name] [name] in Treatment: 13 Active Inactive Wound/Skin Impairment Nursing Diagnoses: Impaired tissue integrity Knowledge deficit related to ulceration/compromised skin integrity Goals: Patient/caregiver will verbalize understanding of skin care regimen Date Initiated: 05/19/2019 Target Resolution Date: 09/12/2019 Goal Status: Active Ulcer/skin breakdown will have Sarah [name] volume reduction of 30% by week 4 Date Initiated: 05/19/2019 Date Inactivated: 06/16/2019 Target Resolution Date: 06/20/2019 Goal Status: Met Ulcer/skin breakdown will have Sarah [name] volume reduction of 50% by week 8 Date Initiated: 06/16/2019 Date Inactivated: 07/14/2019 Target Resolution Date: 07/18/2019 Unmet Reason: necrotic Goal Status: Unmet surface, pt will not allow sharp debridement Interventions: Assess patient/caregiver ability to obtain necessary supplies Assess patient/caregiver ability to perform ulcer/skin care regimen upon admission and as needed Assess ulceration(s)  every visit Provide education on ulcer and skin care Notes: Electronic Signature(s) Signed: 08/18/2019 5:31:53 PM By: Sarah Gouty RN, BSN Entered By: Sarah Sarah [name] on 08/18/2019 09:44:27 -------------------------------------------------------------------------------- Pain Assessment Details Patient Name: Date of Service: Sarah Sarah [name] 08/18/2019 9:30 AM Medical Record [id number] Patient Account Number: [id number] Date of Birth/Sex: Treating RN: [birth year] (86 y.o. Sarah Sarah [name] Primary Care Holger Sokolowski: Sarah Sarah [name] Other Clinician: Referring Makita Blow: Sarah Nickie Warwick/Extender:Sarah Sarah [name] [name] in Treatment: 13 Active Problems Location of Pain Severity and Description of Pain Patient Has Paino Yes Site Locations Pain Location: Pain in Ulcers With Dressing Change: Yes Duration of the Pain. Constant / Intermittento Constant Rate the pain. Current Pain Level: 1 Worst Pain Level: 5 Least Pain Level: 1 Tolerable Pain Level: 3 Character of Pain Describe the Pain: Burning Pain Management and Medication Current Pain Management: Electronic Signature(s) Signed: 08/18/2019 5:06:29 PM By: Sarah Sarah [name] Entered By: Sarah Sarah [name] on 08/18/2019 09:41:54 -------------------------------------------------------------------------------- Patient/Caregiver Education Details Patient Name: Date of Service: Sarah Sarah [name] 3/29/2021andnbsp9:30 AM Medical Record [id number] Patient Account Number: [id number] Date of Birth/Gender: Treating RN: [birth year] (86 y.o. Sarah Sarah [name] Primary Care Physician: Sarah Sarah [name] Other Clinician: Referring Physician: Treating Physician/Extender:Robson, Sarah Sarah [name], Sarah Sarah [name] in Treatment: 13 Education Assessment Education Provided To: Patient Education Topics Provided Venous: Methods: Explain/Verbal Responses: Reinforcements needed, State content correctly Wound/Skin  Impairment: Methods: Explain/Verbal Responses: Reinforcements needed,  State content correctly Electronic Signature(s) Signed: 08/18/2019 5:31:53 PM By: Sarah Gouty RN, BSN Entered By: Sarah Sarah [name] on 08/18/2019 10:11:38 -------------------------------------------------------------------------------- Wound Assessment Details Patient Name: Date of Service: Sarah Sarah [name], Sarah Sarah [name] 08/18/2019 9:30 AM Medical Record [id number] Patient Account Number: [id number] Date of Birth/Sex: Treating RN: January 16, 1933 (86 y.o. Sarah Sarah [name] Primary Care Mannie Wineland: Sarah Sarah [name] Other Clinician: Referring Evalina Tabak: Sarah Easton Sivertson/Extender:Sarah Sarah [name] [name] in Treatment: 13 Wound Status Wound Number: 1 Primary Venous Leg Ulcer Etiology: Wound Location: Left Lower Leg - Anterior Wound Open Wounding Event: Gradually Appeared Status: Date Acquired: 12/21/2018 Comorbid Hypertension, Received Chemotherapy, [name] Of Treatment: 13 History: Received Radiation Clustered Wound: No Photos Photo Uploaded By: Mikeal Hawthorne on 08/22/2019 16:42:20 Wound Measurements Length: (cm) 0.3 % Reduct Width: (cm) 0.4 % Reduct Depth: (cm) 0.1 Epitheli Area: (cm) 0.094 Tunneli Volume: (cm) 0.009 Undermi Wound Description Full Thickness Without Exposed Support Foul Od Classification: Structures Slough/ Wound Flat and Intact Margin: Exudate Medium Amount: Exudate Serosanguineous Type: Exudate red, brown Color: Wound Bed Granulation Amount: Large (67-100%) Granulation Quality: Red Fascia E Necrotic Amount: None Present (0%) Fat Layer ( Tendon Expo Muscle Expo Joint Expos Bone Expose or After Cleansing: No Fibrino No Exposed Structure xposed: No Subcutaneous Tissue) Exposed: Yes sed: No sed: No ed: No d: No ion in Area: 96.7% ion in Volume: 98.9% alization: Large (67-100%) ng: No ning: No Treatment Notes Wound #1 (Left, Anterior Lower Leg) 1. Cleanse  With Wound Cleanser Soap and water 2. Periwound Care Moisturizing lotion 3. Primary Dressing Applied Iodoflex 4. Secondary Dressing Dry Gauze 6. Support Layer Applied 3 layer compression wrap Notes netting Electronic Signature(s) Signed: 08/18/2019 5:06:29 PM By: Sarah Sarah [name] Entered By: Sarah Sarah [name] on 08/18/2019 09:46:58 -------------------------------------------------------------------------------- Vitals Details Patient Name: Date of Service: Sarah Sarah [name] 08/18/2019 9:30 AM Medical Record [id number] Patient Account Number: [id number] Date of Birth/Sex: Treating RN: [birth year] (86 y.o. Sarah Sarah [name] Primary Care Johaan Ryser: Sarah Sarah [name] Other Clinician: Referring Alexzia Kasler: Sarah Kendre Jacinto/Extender:Sarah Sarah [name] [name] in Treatment: 13 Vital Signs Time Taken: 09:40 Temperature (F): 98 Height (in): 64 Pulse (bpm): 73 Weight (lbs): 111 Respiratory Rate (breaths/min): 18 Body Mass Index (BMI): 19.1 Blood Pressure (mmHg): 120/75 Reference Range: 80 - 120 mg / dl Electronic Signature(s) Signed: 08/18/2019 5:06:29 PM By: Sarah Sarah [name] Entered By: Sarah Sarah [name] on 08/18/2019 09:41:19

## 2019-08-25 ENCOUNTER — Other Ambulatory Visit: Payer: Self-pay

## 2019-08-25 ENCOUNTER — Encounter (HOSPITAL_BASED_OUTPATIENT_CLINIC_OR_DEPARTMENT_OTHER): Payer: Medicare Other | Attending: Internal Medicine | Admitting: Internal Medicine

## 2019-08-25 DIAGNOSIS — Z86718 Personal history of other venous thrombosis and embolism: Secondary | ICD-10-CM | POA: Insufficient documentation

## 2019-08-25 DIAGNOSIS — Z86711 Personal history of pulmonary embolism: Secondary | ICD-10-CM | POA: Diagnosis not present

## 2019-08-25 DIAGNOSIS — L97929 Non-pressure chronic ulcer of unspecified part of left lower leg with unspecified severity: Secondary | ICD-10-CM | POA: Diagnosis present

## 2019-08-25 DIAGNOSIS — C865 Angioimmunoblastic T-cell lymphoma: Secondary | ICD-10-CM | POA: Diagnosis not present

## 2019-08-25 DIAGNOSIS — Z853 Personal history of malignant neoplasm of breast: Secondary | ICD-10-CM | POA: Insufficient documentation

## 2019-08-25 DIAGNOSIS — I87312 Chronic venous hypertension (idiopathic) with ulcer of left lower extremity: Secondary | ICD-10-CM | POA: Diagnosis not present

## 2019-08-25 DIAGNOSIS — L97828 Non-pressure chronic ulcer of other part of left lower leg with other specified severity: Secondary | ICD-10-CM | POA: Insufficient documentation

## 2019-08-25 DIAGNOSIS — Z96651 Presence of right artificial knee joint: Secondary | ICD-10-CM | POA: Insufficient documentation

## 2019-08-25 NOTE — Progress Notes (Signed)
MAILI, SHUTTERS ([id number]) Visit Report for 08/25/2019 Arrival Information Details Patient Name: Date of Service: Sarah [name], Sarah [name] 08/25/2019 10:00 AM Medical Record [id number] Patient Account Number: [id number] Date of Birth/Sex: Treating RN: 1933-01-15 (86 y.o. [name] Primary Care Jamesha Ellsworth: Lajean Manes T Other Clinician: Referring Shereece Wellborn: Treating Sabir Charters/Extender:Robson, [name], Hal T Weeks in Treatment: 14 Visit Information History Since Last Visit Added or deleted any medications: No Patient Arrived: Ambulatory Any new allergies or adverse reactions: No Arrival Time: 10:05 Had a fall or experienced change in No Accompanied By: self activities of daily living that may affect Transfer Assistance: None risk of falls: Patient Identification Verified: Yes Signs or symptoms of abuse/neglect since last No Secondary Verification Process Completed: Yes visito Patient Requires Transmission-Based No Hospitalized since last visit: No Precautions: Implantable device outside of the clinic excluding No Patient Has Alerts: No cellular tissue based products placed in the center since last visit: Has Dressing in Place as Prescribed: Yes Pain Present Now: No Electronic Signature(s) Signed: 08/25/2019 4:55:48 PM By: Kela Millin Entered By: Kela Millin on 08/25/2019 10:05:44 -------------------------------------------------------------------------------- Clinic Level of Care Assessment Details Patient Name: Date of Service: Sarah, [name] 08/25/2019 10:00 AM Medical Record [id number] Patient Account Number: [id number] Date of Birth/Sex: Treating RN: 1933-01-27 (86 y.o. Marieta Fetter Primary Care Alessia Gonsalez: Lajean Manes T Other Clinician: Referring Altonio Schwertner: Treating Henritta Mutz/Extender:Robson, [name], Hal T Weeks in Treatment: 14 Clinic Level of Care Assessment Items TOOL 4 Quantity Score X - Use when only an  EandM is performed on FOLLOW-UP visit 1 0 ASSESSMENTS - Nursing Assessment / Reassessment X - Reassessment of Co-morbidities (includes updates in patient status) 1 10 X - Reassessment of Adherence to Treatment Plan 1 5 ASSESSMENTS - Wound and Skin Assessment / Reassessment X - Simple Wound Assessment / Reassessment - one wound 1 5 []  - Complex Wound Assessment / Reassessment - multiple wounds 0 []  - Dermatologic / Skin Assessment (not related to wound area) 0 ASSESSMENTS - Focused Assessment []  - Circumferential Edema Measurements - multi extremities 0 []  - Nutritional Assessment / Counseling / Intervention 0 X - Lower Extremity Assessment (monofilament, tuning fork, pulses) 1 5 []  - Peripheral Arterial Disease Assessment (using hand held doppler) 0 ASSESSMENTS - Ostomy and/or Continence Assessment and Care []  - Incontinence Assessment and Management 0 []  - Ostomy Care Assessment and Management (repouching, etc.) 0 PROCESS - Coordination of Care X - Simple Patient / Family Education for ongoing care 1 15 []  - Complex (extensive) Patient / Family Education for ongoing care 0 X - Staff obtains Programmer, systems, Records, Test Results / Process Orders 1 10 []  - Staff telephones HHA, Nursing Homes / Clarify orders / etc 0 []  - Routine Transfer to another Facility (non-emergent condition) 0 []  - Routine Hospital Admission (non-emergent condition) 0 []  - New Admissions / Biomedical engineer / Ordering NPWT, Apligraf, etc. 0 []  - Emergency Hospital Admission (emergent condition) 0 X - Simple Discharge Coordination 1 10 []  - Complex (extensive) Discharge Coordination 0 PROCESS - Special Needs []  - Pediatric / Minor Patient Management 0 []  - Isolation Patient Management 0 []  - Hearing / Language / Visual special needs 0 []  - Assessment of Community assistance (transportation, D/C planning, etc.) 0 []  - Additional assistance / Altered mentation 0 []  - Support Surface(s) Assessment (bed, cushion,  seat, etc.) 0 INTERVENTIONS - Wound Cleansing / Measurement X - Simple Wound Cleansing - one wound 1 5 []  - Complex Wound Cleansing - multiple wounds 0 X -  Wound Imaging (photographs - any number of wounds) 1 5 []  - Wound Tracing (instead of photographs) 0 X - Simple Wound Measurement - one wound 1 5 []  - Complex Wound Measurement - multiple wounds 0 INTERVENTIONS - Wound Dressings []  - Small Wound Dressing one or multiple wounds 0 []  - Medium Wound Dressing one or multiple wounds 0 []  - Large Wound Dressing one or multiple wounds 0 []  - Application of Medications - topical 0 []  - Application of Medications - injection 0 INTERVENTIONS - Miscellaneous []  - External ear exam 0 []  - Specimen Collection (cultures, biopsies, blood, body fluids, etc.) 0 []  - Specimen(s) / Culture(s) sent or taken to Lab for analysis 0 []  - Patient Transfer (multiple staff / Civil Service fast streamer / Similar devices) 0 []  - Simple Staple / Suture removal (25 or less) 0 []  - Complex Staple / Suture removal (26 or more) 0 []  - Hypo / Hyperglycemic Management (close monitor of Blood Glucose) 0 []  - Ankle / Brachial Index (ABI) - do not check if billed separately 0 X - Vital Signs 1 5 Has the patient been seen at the hospital within the last three years: Yes Total Score: 80 Level Of Care: New/Established - Level 3 Electronic Signature(s) Signed: 08/25/2019 5:09:30 PM By: Levan Hurst RN, BSN Entered By: Levan Hurst on 08/25/2019 12:02:53 -------------------------------------------------------------------------------- Lower Extremity Assessment Details Patient Name: Date of Service: Sarah [name] 08/25/2019 10:00 AM Medical Record [id number] Patient Account Number: [id number] Date of Birth/Sex: Treating RN: [birth year] (86 y.o. [name] Primary Care Keano Guggenheim: Lajean Manes T Other Clinician: Referring Taylen Osorto: Treating Navarro Nine/Extender:Robson, [name], Hal T Weeks in Treatment:  14 Edema Assessment Assessed: [Left: No] [Right: No] Edema: [Left: Ye] [Right: s] Calf Left: Right: Point of Measurement: 36 cm From Medial Instep 28.5 cm cm Ankle Left: Right: Point of Measurement: 9 cm From Medial Instep 19 cm cm Vascular Assessment Pulses: Dorsalis Pedis Palpable: [Left:Yes] Electronic Signature(s) Signed: 08/25/2019 4:55:48 PM By: Kela Millin Entered By: Kela Millin on 08/25/2019 10:06:19 -------------------------------------------------------------------------------- Multi Wound Chart Details Patient Name: Date of Service: Sarah [name] 08/25/2019 10:00 AM Medical Record [id number] Patient Account Number: [id number] Date of Birth/Sex: Treating RN: [birth year] (86 y.o. Geralyn Fetter Primary Care Quiana Cobaugh: Lajean Manes T Other Clinician: Referring Christiaan Strebeck: Treating Anberlyn Feimster/Extender:Robson, [name], Hal T Weeks in Treatment: 14 Vital Signs Height(in): 64 Pulse(bpm): 78 Weight(lbs): 111 Blood Pressure(mmHg): 123/72 Body Mass Index(BMI): 19 Temperature(F): 97.7 Respiratory 18 Rate(breaths/min): Photos: [1:No Photos] [N/A:N/A] Wound Location: [1:Left, Anterior Lower Leg] [N/A:N/A] Wounding Event: [1:Gradually Appeared] [N/A:N/A] Primary Etiology: [1:Venous Leg Ulcer] [N/A:N/A] Comorbid History: [1:Hypertension, Received Chemotherapy, Received Radiation] [N/A:N/A] Date Acquired: [1:12/21/2018] [N/A:N/A] Weeks of Treatment: [1:14] [N/A:N/A] Wound Status: [1:Healed - Epithelialized] [N/A:N/A] Measurements L x W x D 0x0x0 [N/A:N/A] (cm) Area (cm) : [1:0] [N/A:N/A] Volume (cm) : [1:0] [N/A:N/A] % Reduction in Area: [1:100.00%] [N/A:N/A] % Reduction in Volume: 100.00% [N/A:N/A] Classification: [1:Full Thickness Without Exposed Support Structures] [N/A:N/A] Exudate Amount: [1:None Present] [N/A:N/A] Wound Margin: [1:Flat and Intact] [N/A:N/A] Granulation Amount: [1:None Present (0%)] [N/A:N/A] Necrotic Amount:  [1:None Present (0%)] [N/A:N/A] Exposed Structures: [1:Fascia: No Fat Layer (Subcutaneous Tissue) Exposed: No Tendon: No Muscle: No Joint: No Bone: No Large (67-100%)] [N/A:N/A N/A] Treatment Notes Electronic Signature(s) Signed: 08/25/2019 5:09:30 PM By: Levan Hurst RN, BSN Signed: 08/25/2019 5:24:04 PM By: Linton Ham MD Entered By: Linton Ham on 08/25/2019 10:47:21 -------------------------------------------------------------------------------- Multi-Disciplinary Care Plan Details Patient Name: Date of Service: Sarah [name]. 08/25/2019 10:00 AM Medical Record [id number] Patient  Account Number: [id number] Date of Birth/Sex: Treating RN: 03-Feb-1933 (86 y.o. Cassanda Fetter Primary Care Davena Julian: Patrina Levering Other Clinician: Referring Porfiria Heinrich: Treating Thai Hemrick/Extender:Robson, [name], Junius Creamer in Treatment: 14 Active Inactive Electronic Signature(s) Signed: 08/25/2019 5:09:30 PM By: Levan Hurst RN, BSN Entered By: Levan Hurst on 08/25/2019 10:25:03 -------------------------------------------------------------------------------- Pain Assessment Details Patient Name: Date of Service: Sarah [name] 08/25/2019 10:00 AM Medical Record [id number] Patient Account Number: [id number] Date of Birth/Sex: Treating RN: Mar 23, 1933 (86 y.o. [name] Primary Care Hollyn Stucky: Patrina Levering Other Clinician: Referring Jaiyana Canale: Treating Murray Durrell/Extender:Robson, [name], Hal T Weeks in Treatment: 14 Active Problems Location of Pain Severity and Description of Pain Patient Has Paino No Site Locations Pain Management and Medication Current Pain Management: Electronic Signature(s) Signed: 08/25/2019 4:55:48 PM By: Kela Millin Entered By: Kela Millin on 08/25/2019 10:06:00 -------------------------------------------------------------------------------- Patient/Caregiver Education Details Patient Name: Date  of Service: [place], Nonna J. 4/5/2021andnbsp10:00 AM Medical Record [id number] Patient Account Number: [id number] Date of Birth/Gender: Treating RN: [birth year] (86 y.o. Jaeleen Fetter Primary Care Physician: Patrina Levering Other Clinician: Referring Physician: Treating Physician/Extender:Robson, [name], Junius Creamer in Treatment: 14 Education Assessment Education Provided To: Patient Education Topics Provided Wound/Skin Impairment: Methods: Explain/Verbal Responses: State content correctly Electronic Signature(s) Signed: 08/25/2019 5:09:30 PM By: Levan Hurst RN, BSN Entered By: Levan Hurst on 08/25/2019 10:25:27 -------------------------------------------------------------------------------- Wound Assessment Details Patient Name: Date of Service: Sarah [name] 08/25/2019 10:00 AM Medical Record [id number] Patient Account Number: [id number] Date of Birth/Sex: Treating RN: Apr 15, 1933 (86 y.o. [name] Primary Care Kenner Lewan: Lajean Manes T Other Clinician: Referring Destanie Tibbetts: Treating Amire Leazer/Extender:Robson, [name], Hal T Weeks in Treatment: 14 Wound Status Wound Number: 1 Primary Venous Leg Ulcer Etiology: Wound Location: Left, Anterior Lower Leg Wound Healed - Epithelialized Wounding Event: Gradually Appeared Status: Date Acquired: 12/21/2018 Comorbid Hypertension, Received Chemotherapy, Weeks Of Treatment: 14 History: Received Radiation Clustered Wound: No Wound Measurements Length: (cm) 0 % Reduct Width: (cm) 0 % Reduct Depth: (cm) 0 Epitheli Area: (cm) 0 Tunneli Volume: (cm) 0 Undermi Wound Description Classification: Full Thickness Without Exposed Support Foul Odo Structures Slough/F Wound Flat and Intact Margin: Exudate None Present Amount: Wound Bed Granulation Amount: None Present (0%) Necrotic Amount: None Present (0%) Fascia E Fat Layer ( Tendon Expo Muscle Expo Joint Expos Bone  Expose r After Cleansing: No ibrino No Exposed Structure xposed: No Subcutaneous Tissue) Exposed: No sed: No sed: No ed: No d: No ion in Area: 100% ion in Volume: 100% alization: Large (67-100%) ng: No ning: No Electronic Signature(s) Signed: 08/25/2019 4:55:48 PM By: Kela Millin Entered By: Kela Millin on 08/25/2019 10:08:40 -------------------------------------------------------------------------------- Vitals Details Patient Name: Date of Service: Sarah [name] 08/25/2019 10:00 AM Medical Record [id number] Patient Account Number: [id number] Date of Birth/Sex: Treating RN: 1933-02-15 (86 y.o. [name] Primary Care Jisella Ashenfelter: Lajean Manes T Other Clinician: Referring Kert Shackett: Treating Kimmie Doren/Extender:Robson, [name], Hal T Weeks in Treatment: 14 Vital Signs Time Taken: 10:00 Temperature (F): 97.7 Height (in): 64 Pulse (bpm): 78 Weight (lbs): 111 Respiratory Rate (breaths/min): 18 Body Mass Index (BMI): 19.1 Blood Pressure (mmHg): 123/72 Reference Range: 80 - 120 mg / dl Electronic Signature(s) Signed: 08/25/2019 4:55:48 PM By: Kela Millin Entered By: Kela Millin on 08/25/2019 10:07:08

## 2019-08-25 NOTE — Progress Notes (Signed)
Sarah [name], Sarah [name] ([id number]) Visit Report for 08/25/2019 HPI Details Patient Name: Date of Service: MEA, OZGA 08/25/2019 10:00 AM Medical Record [id number] Patient Account Number: [id number] Date of Birth/Sex: Treating RN: [birth year] (86 y.o. Rielyn Fetter Primary Care Provider: Patrina Levering Other Clinician: Referring Provider: Treating Provider/Extender:Jaeshaun Riva, [name], Hal T Weeks in Treatment: 14 History of Present Illness HPI Description: ADMISSION 05/19/2019 This is a an 86 year old woman who is referred from her oncologist Dr.Kale for review of a wound on her left anterior mid tibial area. although there are certain parts of the notes that accompany her and also in [facility] link that suggests she had a wound on the right anterior mid tibia, the patient is fairly insistent that the current wound is the only wound she is ever had. She relates that this began as a small area on her left anterior mid tibia sometime in August i.e. clearly before the diagnosis or treatment of her lymphoma began. She said that has been gradually increasing in size over time she is completely unaware of any other precipitating factors such as trauma etc. She does have what looks to be changes of chronic venous insufficiency in her legs with some degree of hemosiderin deposition and edema but has never had problems with this before. The wound itself is exceptionally painful there is no drainage she has been using Bactroban on this twice a day with the help of her daughter. She was given a course of doxycycline last week by Dr. Donzetta Matters which she is tolerating well She also in September developed a widespread rash that was pruritic. She was seen by her primary doctor and dermatologist I believe. Ultimately she had a CT scan in early October that documented widespread adenopathy with splenomegaly.Marland Kitchen She was hospitalized from 03/06/2019 through 03/08/2019 presenting with shortness  of breath leg swelling on the left. She was diagnosed with a pulmonary embolism with a DVT in the left leg this predominantly involve the left posterior tibial veins and the left gastrocnemius veins. She is on Eliquis since then. The patient was ultimately diagnosed with angioimmunoblastic T-cell lymphoma and she has been on chemotherapy including Adriamycin Cytoxan and brentuximab. She was receiving a course of this every 3 weeks. She stated that the first round of chemotherapy caused her rash to go away. 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 So far she is tolerating her chemotherapy well although this will stretch out into the beginning of March 2021 The patient's major complaint is the pain she has burning pain even at night that sometimes awakens her from sleep. Past medical history; lymphoma as noted above, breast CA with a lumpectomy in 2013, right total knee replacement, hypothyroidism, pulmonary embolism as described, DVT in the left leg also as described. ABI in our clinic was 1.26 on the left 1/11; patient arrives with the surface of this wound completely covered and adherent eschar. Her daughter was able to show me pictures on her cell phone that suggest that this was getting better however it certainly does not look like that today. She will not let me even get close to this for debridement. Even washing the surface of the wound off seems to be on her. 1/25; 2-week follow-up. In general the wound on the left anterior tibia looks better although there is still surface debris under illumination. There is no ability to do mechanical debridement on this the patient simply cannot stand it. Even washing this off with Anasept and gauze  is prohibitive in terms of discomfort. We have been using Santyl and for this foreseeable future that we will need to continue. 2/8; 2-week follow-up. Unfortunately this does not look any better at all. Very tight  adherent debris. She barely will let me get even close to this for examination let alone debridement. We have been using Santyl without effect. The exact cause of this wound is not clear. She had chemotherapy for underlying lymphoma although the wound seems to have occurred before this. I change the primary dressing to Iodoflex with border foam and using her own stocking for edema control. The edema looks better today 2/22; 2-week follow-up. Slightly improved but not in terms of surface area. She did not tolerate the Iodoflex for the next week and she went back to the [place] but she has been using it for most of this past week. She has been using her compression stocking 3/1; we are using Iodoflex under wraps. I think her wound actually looks some better although there is still debris here. She tells [place] her lymphoma is in remission which is great news. She is on a short-term course of high-dose prednisone obviously not wonderful in terms of wound healing 3/8; we are using Iodoflex under compression. Wound bed looks a lot better although there is not much change in surface area. She will not allow mechanical debridement. She is taking the dressing off before she comes in here on a weekly basis and washing it out in the shower 3/15; using Iodoflex under compression the wound bed looks better slightly smaller. She has 2 rims of epithelialization 3/22; we are using Iodoflex. Surface of the wound on the left lateral calf a lot better. Surface area also better. We are putting this under compression. She will not allow mechanical debridement but we have been able to get a fairly decent debridement with Anasept and gauze 3/29; we have been using Iodoflex and this wound that was difficult has really contracted nicely in terms of surface area. We also put this under compression which I think is also helped 4/5; the patient's wound is totally healed this week. She has stockings from elastic therapy in  [place] but I am not exactly sure what strength they are. May simply be support stocking great Electronic Signature(s) Signed: 08/25/2019 5:24:04 PM By: Linton Ham MD Entered By: Linton Ham on 08/25/2019 10:48:15 -------------------------------------------------------------------------------- Physical Exam Details Patient Name: Date of Service: [name] 08/25/2019 10:00 AM Medical Record [id number] Patient Account Number: [id number] Date of Birth/Sex: Treating RN: Sep 26, 1932 (86 y.o. Georgana Fetter Primary Care Provider: Patrina Levering Other Clinician: Referring Provider: Treating Provider/Extender:Dastan Krider, [name], Hal T Weeks in Treatment: 14 Constitutional Sitting or standing Blood Pressure is within target range for patient.. Pulse regular and within target range for patient.Marland Kitchen Respirations regular, non-labored and within target range.. Temperature is normal and within the target range for the patient.Marland Kitchen Appears in no distress. Notes Wound exam; the surface of the wound is fully epithelialized. She has chronic venous insufficiency. Her edema is generally well controlled under our compression. Electronic Signature(s) Signed: 08/25/2019 5:24:04 PM By: Linton Ham MD Entered By: Linton Ham on 08/25/2019 10:49:04 -------------------------------------------------------------------------------- Physician Orders Details Patient Name: Date of Service: [name] 08/25/2019 10:00 AM Medical Record [id number] Patient Account Number: [id number] Date of Birth/Sex: Treating RN: June 11, 1932 (86 y.o. Kalifa Fetter Primary Care Provider: Patrina Levering Other Clinician: Referring Provider: Treating Provider/Extender:Shamyra Farias, [name], Hal T Weeks in Treatment: 14 Verbal / Phone Orders:  No Diagnosis Coding ICD-10 Coding Code Description [phone] Non-pressure chronic ulcer of other part of left lower leg with other  specified severity I87.312 Chronic venous hypertension (idiopathic) with ulcer of left lower extremity C86.5 Angioimmunoblastic T-cell lymphoma Discharge From [facility] Discharge from [place] lotion - both legs daily Edema Control Avoid standing for long periods of time Elevate legs to the level of the heart or above for 30 minutes daily and/or when sitting, a frequency of: - throughout the day Exercise regularly Support Garment 20-30 mm/Hg pressure to: - compression stockings to both legs daily - apply first thing in the morning, remove at night before bed Electronic Signature(s) Signed: 08/25/2019 5:09:30 PM By: Levan Hurst RN, BSN Signed: 08/25/2019 5:24:04 PM By: Linton Ham MD Entered By: Levan Hurst on 08/25/2019 10:24:12 -------------------------------------------------------------------------------- Problem List Details Patient Name: Date of Service: [name] 08/25/2019 10:00 AM Medical Record [id number] Patient Account Number: [id number] Date of Birth/Sex: Treating RN: [birth year] (86 y.o. Orion Fetter Primary Care Provider: Patrina Levering Other Clinician: Referring Provider: Treating Provider/Extender:Caz Weaver, [name], Junius Creamer in Treatment: 14 Active Problems ICD-10 Evaluated Encounter Code Description Active Date Today Diagnosis L97.828 Non-pressure chronic ulcer of other part of left lower 05/19/2019 No Yes leg with other specified severity I87.312 Chronic venous hypertension (idiopathic) with ulcer of 05/19/2019 No Yes left lower extremity C86.5 Angioimmunoblastic T-cell lymphoma 05/19/2019 No Yes Inactive Problems Resolved Problems Electronic Signature(s) Signed: 08/25/2019 5:24:04 PM By: Linton Ham MD Entered By: Linton Ham on 08/25/2019 10:47:15 -------------------------------------------------------------------------------- Progress Note  Details Patient Name: Date of Service: [name] 08/25/2019 10:00 AM Medical Record [id number] Patient Account Number: [id number] Date of Birth/Sex: Treating RN: 02/07/1933 (86 y.o. Francina Fetter Primary Care Provider: Patrina Levering Other Clinician: Referring Provider: Treating Provider/Extender:Kamauri Kathol, [name], Hal T Weeks in Treatment: 14 Subjective History of Present Illness (HPI) ADMISSION 05/19/2019 This is a an 86 year old woman who is referred from her oncologist Dr.Kale for review of a wound on her left anterior mid tibial area. although there are certain parts of the notes that accompany her and also in [place] link that suggests she had a wound on the right anterior mid tibia, the patient is fairly insistent that the current wound is the only wound she is ever had. She relates that this began as a small area on her left anterior mid tibia sometime in August i.e. clearly before the diagnosis or treatment of her lymphoma began. She said that has been gradually increasing in size over time she is completely unaware of any other precipitating factors such as trauma etc. She does have what looks to be changes of chronic venous insufficiency in her legs with some degree of hemosiderin deposition and edema but has never had problems with this before. The wound itself is exceptionally painful there is no drainage she has been using Bactroban on this twice a day with the help of her daughter. She was given a course of doxycycline last week by Dr. Donzetta Matters which she is tolerating well She also in September developed a widespread rash that was pruritic. She was seen by her primary doctor and dermatologist I believe. Ultimately she had a CT scan in early October that documented widespread adenopathy with splenomegaly.Marland Kitchen She was hospitalized from 03/06/2019 through 03/08/2019 presenting with shortness of breath leg swelling on the left. She was diagnosed with a  pulmonary embolism with a DVT in the left leg this predominantly involve the left  posterior tibial veins and the left gastrocnemius veins. She is on Eliquis since then. The patient was ultimately diagnosed with angioimmunoblastic T-cell lymphoma and she has been on chemotherapy including Adriamycin Cytoxan and brentuximab. She was receiving a course of this every 3 weeks. She stated that the first round of chemotherapy caused her rash to go away. 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 So far she is tolerating her chemotherapy well although this will stretch out into the beginning of March 2021 The patient's major complaint is the pain she has burning pain even at night that sometimes awakens her from sleep. Past medical history; lymphoma as noted above, breast CA with a lumpectomy in 2013, right total knee replacement, hypothyroidism, pulmonary embolism as described, DVT in the left leg also as described. ABI in our clinic was 1.26 on the left 1/11; patient arrives with the surface of this wound completely covered and adherent eschar. Her daughter was able to show me pictures on her cell phone that suggest that this was getting better however it certainly does not look like that today. She will not let me even get close to this for debridement. Even washing the surface of the wound off seems to be on her. 1/25; 2-week follow-up. In general the wound on the left anterior tibia looks better although there is still surface debris under illumination. There is no ability to do mechanical debridement on this the patient simply cannot stand it. Even washing this off with Anasept and gauze is prohibitive in terms of discomfort. We have been using Santyl and for this foreseeable future that we will need to continue. 2/8; 2-week follow-up. Unfortunately this does not look any better at all. Very tight adherent debris. She barely will let me get even close to this for  examination let alone debridement. We have been using Santyl without effect. The exact cause of this wound is not clear. She had chemotherapy for underlying lymphoma although the wound seems to have occurred before this. I change the primary dressing to Iodoflex with border foam and using her own stocking for edema control. The edema looks better today 2/22; 2-week follow-up. Slightly improved but not in terms of surface area. She did not tolerate the Iodoflex for the next week and she went back to the [place] but she has been using it for most of this past week. She has been using her compression stocking 3/1; we are using Iodoflex under wraps. I think her wound actually looks some better although there is still debris here. She tells [place] her lymphoma is in remission which is great news. She is on a short-term course of high-dose prednisone obviously not wonderful in terms of wound healing 3/8; we are using Iodoflex under compression. Wound bed looks a lot better although there is not much change in surface area. She will not allow mechanical debridement. She is taking the dressing off before she comes in here on a weekly basis and washing it out in the shower 3/15; using Iodoflex under compression the wound bed looks better slightly smaller. She has 2 rims of epithelialization 3/22; we are using Iodoflex. Surface of the wound on the left lateral calf a lot better. Surface area also better. We are putting this under compression. She will not allow mechanical debridement but we have been able to get a fairly decent debridement with Anasept and gauze 3/29; we have been using Iodoflex and this wound that was difficult has really contracted nicely in  terms of surface area. We also put this under compression which I think is also helped 4/5; the patient's wound is totally healed this week. She has stockings from elastic therapy in [place] but I am not exactly sure what strength they are. May simply  be support stocking great Objective Constitutional Sitting or standing Blood Pressure is within target range for patient.. Pulse regular and within target range for patient.Marland Kitchen Respirations regular, non-labored and within target range.. Temperature is normal and within the target range for the patient.Marland Kitchen Appears in no distress. Vitals Time Taken: 10:00 AM, Height: 64 in, Weight: 111 lbs, BMI: 19.1, Temperature: 97.7 F, Pulse: 78 bpm, Respiratory Rate: 18 breaths/min, Blood Pressure: 123/72 mmHg. General Notes: Wound exam; the surface of the wound is fully epithelialized. She has chronic venous insufficiency. Her edema is generally well controlled under our compression. Integumentary (Hair, Skin) Wound #1 status is Healed - Epithelialized. Original cause of wound was Gradually Appeared. The wound is located on the Left,Anterior Lower Leg. The wound measures 0cm length x 0cm width x 0cm depth; 0cm^2 area and 0cm^3 volume. There is no tunneling or undermining noted. There is a none present amount of drainage noted. The wound margin is flat and intact. There is no granulation within the wound bed. There is no necrotic tissue within the wound bed. Assessment Active Problems ICD-10 Non-pressure chronic ulcer of other part of left lower leg with other specified severity Chronic venous hypertension (idiopathic) with ulcer of left lower extremity Angioimmunoblastic T-cell lymphoma Plan Discharge From [facility]: Discharge from [place] Skin Barriers/Peri-Wound Care: Moisturizing lotion - both legs daily Edema Control: Avoid standing for long periods of time Elevate legs to the level of the heart or above for 30 minutes daily and/or when sitting, a frequency of: - throughout the day Exercise regularly Support Garment 20-30 mm/Hg pressure to: - compression stockings to both legs daily - apply first thing in the morning, remove at night before bed 1. The patient can be discharged from  the clinic 2. It may simply be support stocking she got from elastic therapy. I am somewhat concerned that this will not be enough compression however I told her we would simply monitor this over time Electronic Signature(s) Signed: 08/25/2019 5:24:04 PM By: Linton Ham MD Entered By: Linton Ham on 08/25/2019 10:51:11 -------------------------------------------------------------------------------- SuperBill Details Patient Name: Date of Service: [name] 08/25/2019 Medical Record [id number] Patient Account Number: [id number] Date of Birth/Sex: Treating RN: 1932-06-07 (86 y.o. Jaylan Fetter Primary Care Provider: Lajean Manes T Other Clinician: Referring Provider: Treating Provider/Extender:Bethannie Iglehart, [name], Hal T Weeks in Treatment: 14 Diagnosis Coding ICD-10 Codes Code Description [phone] Non-pressure chronic ulcer of other part of left lower leg with other specified severity I87.312 Chronic venous hypertension (idiopathic) with ulcer of left lower extremity C86.5 Angioimmunoblastic T-cell lymphoma Facility Procedures CPT4 Code: [id number] Description: 88416 - WOUND CARE VISIT-LEV 3 EST PT Modifier: Quantity: 1 Physician Procedures CPT4 Code Description: [id number] - WC PHYS LEVEL 2 - EST PT ICD-10 Diagnosis Description L97.828 Non-pressure chronic ulcer of other part of left lower leg w severity I87.312 Chronic venous hypertension (idiopathic) with ulcer of left Modifier: ith other specifie lower extremity Quantity: 1 d Electronic Signature(s) Signed: 08/25/2019 5:09:30 PM By: Levan Hurst RN, BSN Signed: 08/25/2019 5:24:04 PM By: Linton Ham MD Entered By: Levan Hurst on 08/25/2019 12:03:10

## 2019-08-26 ENCOUNTER — Other Ambulatory Visit: Payer: Self-pay

## 2019-08-26 ENCOUNTER — Ambulatory Visit (INDEPENDENT_AMBULATORY_CARE_PROVIDER_SITE_OTHER): Payer: Medicare Other | Admitting: Podiatry

## 2019-08-26 ENCOUNTER — Encounter: Payer: Self-pay | Admitting: Podiatry

## 2019-08-26 VITALS — Temp 97.7°F

## 2019-08-26 DIAGNOSIS — M79674 Pain in right toe(s): Secondary | ICD-10-CM

## 2019-08-26 DIAGNOSIS — B351 Tinea unguium: Secondary | ICD-10-CM

## 2019-08-26 DIAGNOSIS — M79675 Pain in left toe(s): Secondary | ICD-10-CM

## 2019-08-26 NOTE — Patient Instructions (Addendum)
  Non-medicated callus pads from [employer]  Onychomycosis/Fungal Toenails  WHAT IS IT? An infection that lies within the keratin of your nail plate that is caused by a fungus.  WHY ME? Fungal infections affect all ages, sexes, races, and creeds.  There may be many factors that predispose you to a fungal infection such as age, coexisting medical conditions such as diabetes, or an autoimmune disease; stress, medications, fatigue, genetics, etc.  Bottom line: fungus thrives in a warm, moist environment and your shoes offer such a location.  IS IT CONTAGIOUS? Theoretically, yes.  You do not want to share shoes, nail clippers or files with someone who has fungal toenails.  Walking around barefoot in the same room or sleeping in the same bed is unlikely to transfer the organism.  It is important to realize, however, that fungus can spread easily from one nail to the next on the same foot.  HOW DO WE TREAT THIS?  There are several ways to treat this condition.  Treatment may depend on many factors such as age, medications, pregnancy, liver and kidney conditions, etc.  It is best to ask your doctor which options are available to you.  1. No treatment.   Unlike many other medical concerns, you can live with this condition.  However for many people this can be a painful condition and may lead to ingrown toenails or a bacterial infection.  It is recommended that you keep the nails cut short to help reduce the amount of fungal nail. 2. Topical treatment.  These range from herbal remedies to prescription strength nail lacquers.  About 40-50% effective, topicals require twice daily application for approximately 9 to 12 months or until an entirely new nail has grown out.  The most effective topicals are medical grade medications available through physicians offices. 3. Oral antifungal medications.  With an 80-90% cure rate, the most common oral medication requires 3 to 4 months of therapy and stays in your system for a  year as the new nail grows out.  Oral antifungal medications do require blood work to make sure it is a safe drug for you.  A liver function panel will be performed prior to starting the medication and after the first month of treatment.  It is important to have the blood work performed to avoid any harmful side effects.  In general, this medication safe but blood work is required. 4. Laser Therapy.  This treatment is performed by applying a specialized laser to the affected nail plate.  This therapy is noninvasive, fast, and non-painful.  It is not covered by insurance and is therefore, out of pocket.  The results have been very good with a 80-95% cure rate.  The [facility] is the only practice in the area to offer this therapy. 5. Permanent Nail Avulsion.  Removing the entire nail so that a new nail will not grow back.

## 2019-09-01 ENCOUNTER — Other Ambulatory Visit: Payer: Self-pay | Admitting: *Deleted

## 2019-09-01 DIAGNOSIS — M7989 Other specified soft tissue disorders: Secondary | ICD-10-CM

## 2019-09-01 NOTE — Progress Notes (Signed)
Subjective: Sarah [name] presents today for follow up of painful mycotic nails b/l that are difficult to trim. Pain interferes with ambulation. Aggravating factors include wearing enclosed shoe gear. Pain is relieved with periodic professional debridement.   Allergies  Allergen Reactions  . Codeine Other (See Comments)    Reaction not recalled  . Latex   . Thiazide-Type Diuretics Other (See Comments)    Blurry vision?? (patient stated she has macular degeneration)     Objective: Vitals:   08/26/19 1354  Temp: 97.7 F (36.5 C)    Pt 86 y.o. year old female  in NAD. AAO x 3.   Vascular Examination:  Capillary refill time to digits immediate b/l. Palpable DP pulses b/l. Palpable PT pulses b/l. Pedal hair absent b/l Skin temperature gradient within normal limits b/l.  Dermatological Examination: Pedal skin with normal turgor, texture and tone bilaterally. No open wounds bilaterally. Toenails 2-5 bilaterally and L hallux elongated, dystrophic, thickened, and crumbly with subungual debris and tenderness to dorsal palpation. Anonychia noted R hallux. Nailbed(s) epithelialized.   Musculoskeletal: Normal muscle strength 5/5 to all lower extremity muscle groups bilaterally, no pain crepitus or joint limitation noted with ROM b/l and hammertoes noted to the  2-5 bilaterally  Neurological: Protective sensation intact 5/5 intact bilaterally with 10g monofilament b/l Vibratory sensation intact b/l Babinski reflex negative b/l Clonus negative b/l  Assessment: No diagnosis found.  Plan: -Toenails 2-5 bilaterally and L hallux debrided in length and girth without iatrogenic bleeding with sterile nail nipper and dremel.  -Patient to continue soft, supportive shoe gear daily. -Patient to report any pedal injuries to medical professional immediately. -Patient/POA to call should there be question/concern in the interim.  Return in about 3 months (around 11/25/2019) for nail trim/ Eliquis.

## 2019-09-03 DIAGNOSIS — C865 Angioimmunoblastic T-cell lymphoma: Secondary | ICD-10-CM | POA: Diagnosis not present

## 2019-09-03 NOTE — Progress Notes (Signed)
HYLA, COARD ([id number]) Visit Report for 07/14/2019 Arrival Information Details Patient Name: Date of Service: Sarah [name], Sarah [name] 07/14/2019 1:30 PM Medical Record [id number] Patient Account Number: [id number] Date of Birth/Sex: Treating RN: 1933/02/01 (86 y.o. Matina Fetter Primary Care Evaan Tidwell: Lajean Manes T Other Clinician: Referring Stryder Poitra: Treating Legend Pecore/Extender:Robson, [name], Hal T Weeks in Treatment: 8 Visit Information History Since Last Visit Added or deleted any medications: No Patient Arrived: Ambulatory Any new allergies or adverse reactions: No Arrival Time: 13:35 Had a fall or experienced change in No Accompanied By: daughter activities of daily living that may affect Transfer Assistance: None risk of falls: Patient Identification Verified: Yes Signs or symptoms of abuse/neglect since last No Secondary Verification Process Completed: Yes visito Patient Requires Transmission-Based No Hospitalized since last visit: No Precautions: Implantable device outside of the clinic excluding No Patient Has Alerts: No cellular tissue based products placed in the center since last visit: Has Dressing in Place as Prescribed: Yes Pain Present Now: No Electronic Signature(s) Signed: 09/03/2019 9:23:26 AM By: Sandre Kitty Entered By: Sandre Kitty on 07/14/2019 13:35:19 -------------------------------------------------------------------------------- Compression Therapy Details Patient Name: Date of Service: Sarah [name] 07/14/2019 1:30 PM Medical Record [id number] Patient Account Number: [id number] Date of Birth/Sex: Treating RN: 26-May-1932 (86 y.o. Zarria Fetter Primary Care Chi Woodham: Patrina Levering Other Clinician: Referring Jacqueleen Pulver: Treating Sumner Kirchman/Extender:Robson, [name], Hal T Weeks in Treatment: 8 Compression Therapy Performed for Wound Wound #1 Left,Anterior Lower Leg Assessment: Performed  By: Clinician Levan Hurst, RN Compression Type: Three Layer Post Procedure Diagnosis Same as Pre-procedure Electronic Signature(s) Signed: 07/17/2019 8:56:01 AM By: Levan Hurst RN, BSN Entered By: Levan Hurst on 07/14/2019 14:31:35 -------------------------------------------------------------------------------- Compression Therapy Details Patient Name: Date of Service: Sarah [name] 07/14/2019 1:30 PM Medical Record [id number] Patient Account Number: [id number] Date of Birth/Sex: Treating RN: 1933-01-12 (86 y.o. Unique Fetter Primary Care Addalee Kavanagh: Patrina Levering Other Clinician: Referring Akilah Cureton: Treating Nelida Mandarino/Extender:Robson, [name], Hal T Weeks in Treatment: 8 Compression Therapy Performed for Wound Wound #2 Left,Lateral Lower Leg Assessment: Performed By: Clinician Levan Hurst, RN Compression Type: Three Layer Post Procedure Diagnosis Same as Pre-procedure Electronic Signature(s) Signed: 07/17/2019 8:56:01 AM By: Levan Hurst RN, BSN Entered By: Levan Hurst on 07/14/2019 14:31:35 -------------------------------------------------------------------------------- Encounter Discharge Information Details Patient Name: Date of Service: Sarah [name] 07/14/2019 1:30 PM Medical Record [id number] Patient Account Number: [id number] Date of Birth/Sex: Treating RN: [birth year] (86 y.o. [name] Primary Care Alaia Lordi: Patrina Levering Other Clinician: Referring Cerena Baine: Treating Gavyn Ybarra/Extender:Robson, [name], Junius Creamer in Treatment: 8 Encounter Discharge Information Items Discharge Condition: Stable Ambulatory Status: Ambulatory Discharge Destination: Home Transportation: Private Auto Accompanied By: daughter Schedule Follow-up Appointment: Yes Clinical Summary of Care: Patient Declined Electronic Signature(s) Signed: 07/14/2019 5:29:42 PM By: Kela Millin Entered By: Kela Millin on  07/14/2019 14:43:26 -------------------------------------------------------------------------------- Lower Extremity Assessment Details Patient Name: Date of Service: Sarah [name] 07/14/2019 1:30 PM Medical Record [id number] Patient Account Number: [id number] Date of Birth/Sex: Treating RN: 08/13/1932 (86 y.o. Daria Fetter Primary Care Darla Mcdonald: Lajean Manes T Other Clinician: Referring Francina Beery: Treating Tadeo Besecker/Extender:Robson, [name], Hal T Weeks in Treatment: 8 Edema Assessment Assessed: [Left: No] [Right: No] Edema: [Left: Ye] [Right: s] Calf Left: Right: Point of Measurement: 36 cm From Medial Instep 29 cm cm Ankle Left: Right: Point of Measurement: 9 cm From Medial Instep 21 cm cm Vascular Assessment Pulses: Dorsalis Pedis Palpable: [Left:Yes] Electronic Signature(s) Signed: 07/17/2019 8:56:01 AM By: Levan Hurst RN, BSN Signed: 09/03/2019 9:23:26 AM  By: Sandre Kitty Entered By: Sandre Kitty on 07/14/2019 13:41:04 -------------------------------------------------------------------------------- Multi Wound Chart Details Patient Name: Date of Service: Sarah [name] 07/14/2019 1:30 PM Medical Record [id number] Patient Account Number: [id number] Date of Birth/Sex: Treating RN: [birth year] (86 y.o. Samyukta Fetter Primary Care Dannell Raczkowski: Lajean Manes T Other Clinician: Referring Mickayla Trouten: Treating [name]/Extender:Robson, [name], Hal T Weeks in Treatment: 8 Vital Signs Height(in): 64 Pulse(bpm): 79 Weight(lbs): 111 Blood Pressure(mmHg): 126/74 Body Mass Index(BMI): 19 Temperature(F): 97.9 Respiratory 18 Rate(breaths/min): Photos: [1:No Photos] [2:No Photos] [N/A:N/A] Wound Location: [1:Left Lower Leg - Anterior Left Lower Leg - Lateral] [N/A:N/A] Wounding Event: [1:Gradually Appeared] [2:Gradually Appeared] [N/A:N/A] Primary Etiology: [1:Venous Leg Ulcer] [2:Venous Leg Ulcer] [N/A:N/A] Comorbid  History: [1:Hypertension, Received Chemotherapy, Received Chemotherapy, Received Radiation] [2:Hypertension, Received Radiation] [N/A:N/A] Date Acquired: [1:12/21/2018] [2:07/07/2019] [N/A:N/A] Weeks of Treatment: [1:8] [2:0] [N/A:N/A] Wound Status: [1:Open] [2:Open] [N/A:N/A] Measurements L x W x D 2.2x1.3x0.2 [2:0.4x0.5x0.1] [N/A:N/A] (cm) Area (cm) : [1:2.246] [2:0.157] [N/A:N/A] Volume (cm) : [1:0.449] [2:0.016] [N/A:N/A] % Reduction in Area: [1:20.60%] [2:0.00%] [N/A:N/A] % Reduction in Volume: 47.10% [2:0.00%] [N/A:N/A] Classification: [1:Full Thickness Without Exposed Support Structures Exposed Support Structures] [2:Full Thickness Without] [N/A:N/A] Exudate Amount: [1:Medium] [2:Medium] [N/A:N/A] Exudate Type: [1:Serosanguineous] [2:Serosanguineous] [N/A:N/A] Exudate Color: [1:red, brown] [2:red, brown] [N/A:N/A] Wound Margin: [1:Flat and Intact] [2:Distinct, outline attached N/A] Granulation Amount: [1:Medium (34-66%)] [2:None Present (0%)] [N/A:N/A] Granulation Quality: [1:Pink] [2:N/A] [N/A:N/A] Necrotic Amount: [1:Medium (34-66%)] [2:Large (67-100%)] [N/A:N/A] Necrotic Tissue: [1:Adherent Slough] [2:Eschar, Adherent Slough N/A] Exposed Structures: [1:Fat Layer (Subcutaneous Fascia: No Tissue) Exposed: Yes Fascia: No Tendon: No Muscle: No Joint: No Bone: No] [2:Fat Layer (Subcutaneous Tissue) Exposed: No Tendon: No Muscle: No Joint: No Bone: No] [N/A:N/A] Epithelialization: [1:Small (1-33%)] [2:None Compression Therapy] [N/A:N/A N/A] Treatment Notes Electronic Signature(s) Signed: 07/14/2019 5:55:43 PM By: Linton Ham MD Signed: 07/17/2019 8:56:01 AM By: Levan Hurst RN, BSN Entered By: Linton Ham on 07/14/2019 14:33:48 -------------------------------------------------------------------------------- [place] Details Patient Name: Date of Service: Sarah [name] 07/14/2019 1:30 PM Medical Record [id number] Patient Account Number:  [id number] Date of Birth/Sex: Treating RN: July 27, 1932 (86 y.o. Maryana Fetter Primary Care Leylany Nored: Patrina Levering Other Clinician: Referring Theotis Gerdeman: Treating Ival Basquez/Extender:Robson, [name], Hal T Weeks in Treatment: 8 Active Inactive Venous Leg Ulcer Nursing Diagnoses: Knowledge deficit related to disease process and management Potential for venous Insuffiency (use before diagnosis confirmed) Goals: Patient will maintain optimal edema control Date Initiated: 05/19/2019 Target Resolution Date: 08/15/2019 Goal Status: Active Patient/caregiver will verbalize understanding of disease process and disease management Date Initiated: 05/19/2019 Date Inactivated: 06/16/2019 Target Resolution Date: 06/20/2019 Goal Status: Met Interventions: Assess peripheral edema status every visit. Provide education on venous insufficiency Notes: Wound/Skin Impairment Nursing Diagnoses: Impaired tissue integrity Knowledge deficit related to ulceration/compromised skin integrity Goals: Patient/caregiver will verbalize understanding of skin care regimen Date Initiated: 05/19/2019 Target Resolution Date: 08/15/2019 Goal Status: Active Ulcer/skin breakdown will have a volume reduction of 30% by week 4 Date Initiated: 05/19/2019 Date Inactivated: 06/16/2019 Target Resolution Date: 06/20/2019 Goal Status: Met Ulcer/skin breakdown will have a volume reduction of 50% by week 8 Date Initiated: 06/16/2019 Date Inactivated: 07/14/2019 Target Resolution Date: 07/18/2019 Unmet Reason: necrotic Goal Status: Unmet surface, pt will not allow sharp debridement Interventions: Assess patient/caregiver ability to obtain necessary supplies Assess patient/caregiver ability to perform ulcer/skin care regimen upon admission and as needed Assess ulceration(s) every visit Provide education on ulcer and skin care Notes: Electronic Signature(s) Signed: 07/17/2019 8:56:01 AM By: Levan Hurst RN,  BSN Entered By: Levan Hurst on 07/14/2019 14:29:50 --------------------------------------------------------------------------------  Pain Assessment Details Patient Name: Date of Service: KAMARRI, FISCHETTI 07/14/2019 1:30 PM Medical Record [id number] Patient Account Number: [id number] Date of Birth/Sex: Treating RN: June 27, 1932 (86 y.o. Jonnae Fetter Primary Care Shyler Hamill: Patrina Levering Other Clinician: Referring Trell Secrist: Treating Veldon Wager/Extender:Robson, [name], Hal T Weeks in Treatment: 8 Active Problems Location of Pain Severity and Description of Pain Patient Has Paino No Site Locations Pain Management and Medication Current Pain Management: Electronic Signature(s) Signed: 07/17/2019 8:56:01 AM By: Levan Hurst RN, BSN Signed: 09/03/2019 9:23:26 AM By: Sandre Kitty Entered By: Sandre Kitty on 07/14/2019 13:39:37 -------------------------------------------------------------------------------- Patient/Caregiver Education Details Patient Name: Date of Service: Sarah [name] 2/22/2021andnbsp1:30 PM Medical Record [id number] Patient Account Number: [id number] Date of Birth/Gender: Treating RN: 05/04/1933 (86 y.o. Kalissa Fetter Primary Care Physician: Patrina Levering Other Clinician: Referring Physician: Treating Physician/Extender:Robson, [name], Junius Creamer in Treatment: 8 Education Assessment Education Provided To: Patient Education Topics Provided Wound/Skin Impairment: Methods: Explain/Verbal Responses: State content correctly Electronic Signature(s) Signed: 07/17/2019 8:56:01 AM By: Levan Hurst RN, BSN Entered By: Levan Hurst on 07/14/2019 18:30:08 -------------------------------------------------------------------------------- Wound Assessment Details Patient Name: Date of Service: Sarah [name] 07/14/2019 1:30 PM Medical Record [id number] Patient Account Number: [id number] Date of  Birth/Sex: Treating RN: 1932/11/03 (86 y.o. Amethyst Fetter Primary Care Chaia Ikard: Lajean Manes T Other Clinician: Referring Cionna Collantes: Treating Fabrizzio Marcella/Extender:Robson, [name], Hal T Weeks in Treatment: 8 Wound Status Wound Number: 1 Primary Venous Leg Ulcer Etiology: Wound Location: Left Lower Leg - Anterior Wound Open Wounding Event: Gradually Appeared Status: Date Acquired: 12/21/2018 Comorbid Hypertension, Received Chemotherapy, Weeks Of Treatment: 8 History: Received Radiation Clustered Wound: No Photos Wound Measurements Length: (cm) 2.2 % Reduct Width: (cm) 1.3 % Reduct Depth: (cm) 0.2 Epitheli Area: (cm) 2.246 Tunneli Volume: (cm) 0.449 Undermi Wound Description Classification: Full Thickness Without Exposed Support Foul Od Structures Slough/ Wound Flat and Intact Margin: Exudate Medium Amount: Exudate Serosanguineous Type: Exudate red, brown Color: Wound Bed Granulation Amount: Medium (34-66%) Granulation Quality: Pink Fascia E Necrotic Amount: Medium (34-66%) Fat Laye Necrotic Quality: Adherent Slough Tendon E Muscle E Joint Ex Bone Exp or After Cleansing: No Fibrino Yes Exposed Structure xposed: No r (Subcutaneous Tissue) Exposed: Yes xposed: No xposed: No posed: No osed: No ion in Area: 20.6% ion in Volume: 47.1% alization: Small (1-33%) ng: No ning: No Electronic Signature(s) Signed: 07/15/2019 5:16:59 PM By: Mikeal Hawthorne EMT/HBOT Signed: 07/17/2019 8:56:01 AM By: Levan Hurst RN, BSN Previous Signature: 07/14/2019 5:30:41 PM Version By: Deon Pilling Entered By: Mikeal Hawthorne on 07/15/2019 15:15:19 -------------------------------------------------------------------------------- Wound Assessment Details Patient Name: Date of Service: Sarah [name] 07/14/2019 1:30 PM Medical Record [id number] Patient Account Number: [id number] Date of Birth/Sex: Treating RN: 18-Sep-1932 (86 y.o. Nya Fetter Primary  Care Judson Tsan: Lajean Manes T Other Clinician: Referring Marquie Aderhold: Treating Jeoffrey Eleazer/Extender:Robson, [name], Hal T Weeks in Treatment: 8 Wound Status Wound Number: 2 Primary Venous Leg Ulcer Etiology: Wound Location: Left Lower Leg - Lateral Wound Open Wounding Event: Gradually Appeared Status: Date Acquired: 07/07/2019 Comorbid Hypertension, Received Chemotherapy, Weeks Of Treatment: 0 History: Received Radiation Clustered Wound: No Photos Wound Measurements Length: (cm) 0.4 % Reduct Width: (cm) 0.5 % Reduct Depth: (cm) 0.1 Epitheli Area: (cm) 0.157 Tunneli Volume: (cm) 0.016 Undermi Wound Description Classification: Full Thickness Without Exposed Support Foul Odo Structures Slough/F Wound Distinct, outline attached Margin: Exudate Medium Amount: Exudate Serosanguineous Type: Exudate red, brown Color: Wound Bed Granulation Amount: None Present (0%) Necrotic Amount: Large (67-100%) Fascia E Necrotic Quality: Eschar, Adherent  Slough Fat Laye Tendon E Muscle E Joint Ex Bone Exp r After Cleansing: No ibrino Yes Exposed Structure xposed: No r (Subcutaneous Tissue) Exposed: No xposed: No xposed: No posed: No osed: No ion in Area: 0% ion in Volume: 0% alization: None ng: No ning: No Electronic Signature(s) Signed: 07/15/2019 5:16:59 PM By: Mikeal Hawthorne EMT/HBOT Signed: 07/17/2019 8:56:01 AM By: Levan Hurst RN, BSN Previous Signature: 07/14/2019 5:30:41 PM Version By: Deon Pilling Entered By: Mikeal Hawthorne on 07/15/2019 15:15:45 -------------------------------------------------------------------------------- [place] Details Patient Name: Date of Service: Sarah [name] 07/14/2019 1:30 PM Medical Record [id number] Patient Account Number: [id number] Date of Birth/Sex: Treating RN: [birth year] (86 y.o. Shauni Fetter Primary Care Lovinia Snare: Lajean Manes T Other Clinician: Referring Janaya Broy: Treating Praise Stennett/Extender:Robson,  [name], Hal T Weeks in Treatment: 8 Vital Signs Time Taken: 13:35 Temperature (F): 97.9 Height (in): 64 Pulse (bpm): 79 Weight (lbs): 111 Respiratory Rate (breaths/min): 18 Body Mass Index (BMI): 19.1 Blood Pressure (mmHg): 126/74 Reference Range: 80 - 120 mg / dl Electronic Signature(s) Signed: 09/03/2019 9:23:26 AM By: Sandre Kitty Entered By: Sandre Kitty on 07/14/2019 13:39:27

## 2019-09-03 NOTE — Progress Notes (Signed)
Sarah, [name] ([id number]) Visit Report for 06/30/2019 Arrival Information Details Patient Name: Date of Service: Sarah [name], Sarah [name] 06/30/2019 11:00 AM Medical Record [id number] Patient Account Number: [id number] Date of Birth/Sex: Treating RN: [birth year] (86 y.o. Sarah [name] Primary Care Sarah [name]: Sarah [name]: Referring Simmie Camerer: Treating Sarah [name]:Sarah [name] [name] in Treatment: 6 Visit Information History Since Last Visit Added or deleted any medications: No Patient Arrived: Ambulatory Any new allergies or adverse reactions: No Arrival Time: 10:56 Had a fall or experienced change in No Accompanied By: Daughter activities of daily living that may affect Transfer Assistance: None risk of falls: Patient Identification Verified: Yes Signs or symptoms of abuse/neglect since last No Secondary Verification Process Completed: Yes visito Patient Requires Transmission-Based No Hospitalized since last visit: No Precautions: Implantable device outside of the clinic excluding No Patient Has Alerts: No cellular tissue based products placed in the center since last visit: Has Dressing in Place as Prescribed: Yes Pain Present Now: Yes Electronic Signature(s) Signed: 09/03/2019 9:23:26 AM By: Sandre Kitty Entered By: Sandre Kitty on 06/30/2019 10:57:10 -------------------------------------------------------------------------------- Clinic Level of Care Assessment Details Patient Name: Date of Service: [name], [name] 06/30/2019 11:00 AM Medical Record [id number] Patient Account Number: [id number] Date of Birth/Sex: Treating RN: 1932-11-26 (86 y.o. Sarah [name] Primary Care Sarah [name]: Sarah [name]: Referring Sarah Rivere: Treating Sarah [name]/Extender:Sarah [name] [name] in Treatment: 6 Clinic Level of Care Assessment Items TOOL 4 Quantity Score X - Use when only an  EandM is performed on FOLLOW-UP visit 1 0 ASSESSMENTS - Nursing Assessment / Reassessment X - Reassessment of Co-morbidities (includes updates in patient status) 1 10 X - Reassessment of Adherence to Treatment Plan 1 5 ASSESSMENTS - Wound and Skin Assessment / Reassessment X - Simple Wound Assessment / Reassessment - one wound 1 5 _0  - Complex Wound Assessment / Reassessment - multiple wounds 0 _1  - Dermatologic / Skin Assessment (not related to wound area) 0 ASSESSMENTS - Focused Assessment _2  - Circumferential Edema Measurements - multi extremities 0 _3  - Nutritional Assessment / Counseling / Intervention 0 X - Lower Extremity Assessment (monofilament, tuning fork, pulses) 1 5 _4  - Peripheral Arterial Disease Assessment (using hand held doppler) 0 ASSESSMENTS - Ostomy and/or Continence Assessment and Care _5  - Incontinence Assessment and Management 0 _6  - Ostomy Care Assessment and Management (repouching, etc.) 0 PROCESS - Coordination of Care X - Simple Patient / Family Education for ongoing care 1 15 _7  - Complex (extensive) Patient / Family Education for ongoing care 0 X - Staff obtains Programmer, systems, Records, Test Results / Process Orders 1 10 _8  - Staff telephones HHA, Nursing Homes / Clarify orders / etc 0 _9  - Routine Transfer to another Facility (non-emergent condition) 0 _10  - Routine Hospital Admission (non-emergent condition) 0 _11  - New Admissions / Biomedical engineer / Ordering NPWT, Apligraf, etc. 0 _12  - Emergency Hospital Admission (emergent condition) 0 X - Simple Discharge Coordination 1 10 _13  - Complex (extensive) Discharge Coordination 0 PROCESS - Special Needs _14  - Pediatric / Minor Patient Management 0 _15  - Isolation Patient Management 0 _16  - Hearing / Language / Visual special needs 0 _17  - Assessment of Community assistance (transportation, D/C planning, etc.) 0 _18  - Additional assistance / Altered mentation 0 _19  - Support Surface(s) Assessment (bed, cushion,  seat, etc.) 0 INTERVENTIONS - Wound Cleansing / Measurement X - Simple Wound Cleansing - one wound 1 5 _20  - Complex Wound Cleansing - multiple wounds 0 X -  Wound Imaging (photographs - any number of wounds) 1 5 _0  - Wound Tracing (instead of photographs) 0 X - Simple Wound Measurement - one wound 1 5 _1  - Complex Wound Measurement - multiple wounds 0 INTERVENTIONS - Wound Dressings X - Small Wound Dressing one or multiple wounds 1 10 _2  - Medium Wound Dressing one or multiple wounds 0 _3  - Large Wound Dressing one or multiple wounds 0 X - Application of Medications - topical 1 5 <[id number]>_1<\/[id number]>_5  - Application of Medications - injection 0 INTERVENTIONS - Miscellaneous _5  - External ear exam 0 _6  - Specimen Collection (cultures, biopsies, blood, body fluids, etc.) 0 _7  - Specimen(s) / Culture(s) sent or taken to Lab for analysis 0 _8  - Patient Transfer (multiple staff / Civil Service fast streamer / Similar devices) 0 _9  - Simple Staple / Suture removal (25 or less) 0 _10  - Complex Staple / Suture removal (26 or more) 0 _11  - Hypo / Hyperglycemic Management (close monitor of Blood Glucose) 0 _12  - Ankle / Brachial Index (ABI) - do not check if billed separately 0 X - Vital Signs 1 5 Has the patient been seen at the hospital within the last three years: Yes Total Score: 95 Level Of Care: New/Established - Level 3 Electronic Signature(s) Signed: 07/01/2019 5:30:42 PM By: Levan Hurst RN, BSN Entered By: Levan Hurst on 06/30/2019 19:06:35 -------------------------------------------------------------------------------- Encounter Discharge Information Details Patient Name: Date of Service: Sarah [name] 06/30/2019 11:00 AM Medical Record [id number] Patient Account Number: [id number] Date of Birth/Sex: Treating RN: [birth year] (86 y.o. [name] Primary Care Atia Haupt: Patrina Levering Other [name]: Referring Klee Kolek: Treating Harald Quevedo/Extender:Sarah [name], Sarah Ginger, Junius Creamer in  Treatment: 6 Encounter Discharge Information Items Discharge Condition: Stable Ambulatory Status: Ambulatory Discharge Destination: Home Transportation: Private Auto Accompanied By: daughter Schedule Follow-up Appointment: Yes Clinical Summary of Care: Patient Declined Electronic Signature(s) Signed: 06/30/2019 5:38:07 PM By: Kela Millin Entered By: Kela Millin on 06/30/2019 11:31:01 -------------------------------------------------------------------------------- Lower Extremity Assessment Details Patient Name: Date of Service: Sarah [name], Sarah [name] 06/30/2019 11:00 AM Medical Record [id number] Patient Account Number: [id number] Date of Birth/Sex: Treating RN: 10/16/1932 (86 y.o. Leonie [name] Primary Care Gurshan Settlemire: Sarah [name]: Referring Dionicio Shelnutt: Treating Tresa Jolley/Extender:Sarah [name] [name] in Treatment: 6 Edema Assessment Assessed: [Left: No] [Right: No] Edema: [Left: Ye] [Right: s] Calf Left: Right: Point of Measurement: 36 cm From Medial Instep 28 cm cm Ankle Left: Right: Point of Measurement: 9 cm From Medial Instep 21.8 cm cm Vascular Assessment Pulses: Dorsalis Pedis Palpable: [Left:Yes] Electronic Signature(s) Signed: 07/01/2019 5:30:42 PM By: Levan Hurst RN, BSN Entered By: Levan Hurst on 06/30/2019 11:08:40 -------------------------------------------------------------------------------- Multi Wound Chart Details Patient Name: Date of Service: Sarah [name] 06/30/2019 11:00 AM Medical Record [id number] Patient Account Number: [id number] Date of Birth/Sex: Treating RN: 17-Dec-1932 (86 y.o. Shakevia [name] Primary Care Faatimah Spielberg: Sarah [name]: Referring Jaydyn Bozzo: Treating Isao Seltzer/Extender:Sarah [name] [name] in Treatment: 6 Vital Signs Height(in): 64 Pulse(bpm): 47 Weight(lbs): 111 Blood Pressure(mmHg): 121/74 Body Mass Index(BMI):  19 Temperature(F): 97.7 Respiratory 18 Rate(breaths/min): Photos: [1:No Photos] [N/A:N/A] Wound Location: [1:Left Lower Leg - Anterior N/A] Wounding Event: [1:Gradually Appeared] [N/A:N/A] Primary Etiology: [1:Venous Leg Ulcer] [N/A:N/A] Comorbid History: [1:Hypertension, Received Chemotherapy, Received Radiation] [N/A:N/A] Date Acquired: [1:12/21/2018] [N/A:N/A] [name] of Treatment: [1:6] [N/A:N/A] Wound Status: [1:Open] [N/A:N/A] Measurements L x W x D 2x1.6x0.2 [N/A:N/A] (cm) Area (cm) : [1:2.513] [N/A:N/A] Volume (cm) : [1:0.503] [N/A:N/A] % Reduction in Area: [1:11.10%] [N/A:N/A] % Reduction in Volume: 40.70% [  N/A:N/A] Classification: [1:Full Thickness Without Exposed Support Structures] [N/A:N/A] Exudate Amount: [1:Medium] [N/A:N/A] Exudate Type: [1:Serosanguineous] [N/A:N/A] Exudate Color: [1:red, brown] [N/A:N/A] Wound Margin: [1:Flat and Intact] [N/A:N/A] Granulation Amount: [1:Small (1-33%)] [N/A:N/A] Granulation Quality: [1:Pink] [N/A:N/A] Necrotic Amount: [1:Large (67-100%)] [N/A:N/A] Exposed Structures: [1:Fat Layer (Subcutaneous N/A Tissue) Exposed: Yes Fascia: No Tendon: No Muscle: No Joint: No Bone: No Small (1-33%)] [N/A:N/A] Treatment Notes Wound #1 (Left, Anterior Lower Leg) 1. Cleanse With Wound Cleanser 2. Periwound Care Skin Prep 3. Primary Dressing Applied Iodoflex 4. Secondary Dressing Dry Gauze Roll Gauze 5. Secured With Recruitment consultant) Signed: 06/30/2019 6:01:37 PM By: Linton Ham MD Signed: 07/01/2019 5:30:42 PM By: Levan Hurst RN, BSN Entered By: Linton Ham on 06/30/2019 12:43:04 -------------------------------------------------------------------------------- Multi-Disciplinary Care Plan Details Patient Name: Date of Service: Sarah [name], Sarah [name] 06/30/2019 11:00 AM Medical Record [id number] Patient Account Number: [id number] Date of Birth/Sex: Treating RN: 03-20-1933 (86 y.o. Bindu [name] Primary Care  Kamilah Correia: Patrina Levering Other [name]: Referring Emmilyn Crooke: Treating Marigrace Mccole/Extender:Sarah [name] [name] in Treatment: 6 Active Inactive Venous Leg Ulcer Nursing Diagnoses: Knowledge deficit related to disease process and management Potential for venous Insuffiency (use before diagnosis confirmed) Goals: Patient will maintain optimal edema control Date Initiated: 05/19/2019 Target Resolution Date: 07/18/2019 Goal Status: Active Patient/caregiver will verbalize understanding of disease process and disease management Date Initiated: 05/19/2019 Date Inactivated: 06/16/2019 Target Resolution Date: 06/20/2019 Goal Status: Met Interventions: Assess peripheral edema status every visit. Provide education on venous insufficiency Notes: Wound/Skin Impairment Nursing Diagnoses: Impaired tissue integrity Knowledge deficit related to ulceration/compromised skin integrity Goals: Patient/caregiver will verbalize understanding of skin care regimen Date Initiated: 05/19/2019 Target Resolution Date: 07/18/2019 Goal Status: Active Ulcer/skin breakdown will have a volume reduction of 30% by week 4 Date Initiated: 05/19/2019 Date Inactivated: 06/16/2019 Target Resolution Date: 06/20/2019 Goal Status: Met Ulcer/skin breakdown will have a volume reduction of 50% by week 8 Date Initiated: 06/16/2019 Target Resolution Date: 07/18/2019 Goal Status: Active Interventions: Assess patient/caregiver ability to obtain necessary supplies Assess patient/caregiver ability to perform ulcer/skin care regimen upon admission and as needed Assess ulceration(s) every visit Provide education on ulcer and skin care Notes: Electronic Signature(s) Signed: 07/01/2019 5:30:42 PM By: Levan Hurst RN, BSN Entered By: Levan Hurst on 06/30/2019 11:17:56 -------------------------------------------------------------------------------- Pain Assessment Details Patient Name: Date of Service: Sarah [name] 06/30/2019 11:00 AM Medical Record [id number] Patient Account Number: [id number] Date of Birth/Sex: Treating RN: Nov 02, 1932 (86 y.o. Pakou [name] Primary Care Izabell Schalk: Patrina Levering Other [name]: Referring Monay Houlton: Treating Ritu Gagliardo/Extender:Sarah [name] [name] in Treatment: 6 Active Problems Location of Pain Severity and Description of Pain Patient Has Paino Yes Site Locations Rate the pain. Current Pain Level: 5 Pain Management and Medication Current Pain Management: Electronic Signature(s) Signed: 07/01/2019 5:30:42 PM By: Levan Hurst RN, BSN Signed: 09/03/2019 9:23:26 AM By: Sandre Kitty Entered By: Sandre Kitty on 06/30/2019 10:59:45 -------------------------------------------------------------------------------- Patient/Caregiver Education Details Patient Name: Date of Service: Sarah [name], Sarah J. 2/8/2021andnbsp11:00 AM Medical Record [phone] Patient Account Number: [id number] Date of Birth/Gender: Treating RN: 19-Sep-1932 (86 y.o. Onetha [name] Primary Care Physician: Patrina Levering Other [name]: Referring Physician: Treating Physician/Extender:Sarah [name], Sarah Ginger, Junius Creamer in Treatment: 6 Education Assessment Education Provided To: Patient Education Topics Provided Wound/Skin Impairment: Methods: Explain/Verbal Responses: State content correctly Electronic Signature(s) Signed: 07/01/2019 5:30:42 PM By: Levan Hurst RN, BSN Entered By: Levan Hurst on 06/30/2019 11:18:08 -------------------------------------------------------------------------------- Wound Assessment Details Patient Name: Date of Service: Sarah [name] 06/30/2019 11:00 AM Medical Record [id number]  Patient Account Number: [id number] Date of Birth/Sex: Treating RN: 01-19-1933 (86 y.o. Michaelah [name] Primary Care Makaveli Hoard: Sarah [name]: Referring Yossef Gilkison: Treating  Frankee Gritz/Extender:Sarah [name] [name] in Treatment: 6 Wound Status Wound Number: 1 Primary Venous Leg Ulcer Etiology: Wound Location: Left Lower Leg - Anterior Wound Open Wounding Event: Gradually Appeared Status: Date Acquired: 12/21/2018 Comorbid Hypertension, Received Chemotherapy, [name] Of Treatment: 6 History: Received Radiation Clustered Wound: No Photos Wound Measurements Length: (cm) 2 % Reduct Width: (cm) 1.6 % Reduct Depth: (cm) 0.2 Epitheli Area: (cm) 2.513 Tunneli Volume: (cm) 0.503 Undermi Wound Description Classification: Full Thickness Without Exposed Support Foul Od Structures Slough/ Wound Flat and Intact Margin: Exudate Medium Amount: Exudate Serosanguineous Type: Exudate red, brown Color: Wound Bed Granulation Amount: Small (1-33%) Granulation Quality: Pink Fascia E Necrotic Amount: Large (67-100%) Fat Laye Necrotic Quality: Adherent Slough Tendon E Muscle E Joint Ex Bone Exp Electronic Signature(s) Signed: 07/01/2019 4:28:10 PM By: Mikeal Hawthorne EMT/HBOT Signed: 07/01/2019 5:30:42 PM By: Levan Hurst RN, BSN Entered By: Mikeal Hawthorne on 02/09/ or After Cleansing: No Fibrino Yes Exposed Structure xposed: No r (Subcutaneous Tissue) Exposed: Yes xposed: No xposed: No posed: No osed: No 2021 11:48:56 ion in Area: 11.1% ion in Volume: 40.7% alization: Small (1-33%) ng: No ning: No -------------------------------------------------------------------------------- Vitals Details Patient Name: Date of Service: Sarah [name], Sarah [name] 06/30/2019 11:00 AM Medical Record [id number] Patient Account Number: [id number] Date of Birth/Sex: Treating RN: 11/03/1932 (86 y.o. Lorre [name] Primary Care Amaiyah Nordhoff: Sarah [name]: Referring Donnabelle Blanchard: Treating Dakin Madani/Extender:Sarah [name] [name] in Treatment: 6 Vital Signs Time Taken: 10:59 Temperature (F): 97.7 Height (in): 64 Pulse  (bpm): 47 Weight (lbs): 111 Respiratory Rate (breaths/min): 18 Body Mass Index (BMI): 19.1 Blood Pressure (mmHg): 121/74 Reference Range: 80 - 120 mg / dl Electronic Signature(s) Signed: 09/03/2019 9:23:26 AM By: Sandre Kitty Entered By: Sandre Kitty on 06/30/2019 10:59:41

## 2019-09-03 NOTE — Progress Notes (Signed)
Sarah [name], Sarah [name] ([id number]) Visit Report for 08/04/2019 Arrival Information Details Patient Name: Date of Service: Sarah [name], Sarah [name] 08/04/2019 11:15 AM Medical Record [id number] Patient Account Number: [id number] Date of Birth/Sex: Treating RN: Jun 17, 1932 (86 y.o. Sarah [name] Primary Care Sarah [name]: Sarah [name] Other Clinician: Referring Sarah [name]: Treating Sarah [name]/Extender:Sarah [name] in Treatment: 11 Visit Information History Since Last Visit Added or deleted any medications: No Patient Arrived: Ambulatory Any new allergies or adverse reactions: No Arrival Time: 11:21 Had a fall or experienced change in No Accompanied By: daughter activities of daily living that may affect Transfer Assistance: None risk of falls: Patient Identification Verified: Yes Signs or symptoms of abuse/neglect since last No Secondary Verification Process Completed: Yes visito Patient Requires Transmission-Based No Hospitalized since last visit: No Precautions: Implantable device outside of the clinic excluding No Patient Has Alerts: No cellular tissue based products placed in the center since last visit: Has Dressing in Place as Prescribed: Yes Pain Present Now: No Electronic Signature(s) Signed: 09/03/2019 9:21:08 AM By: Sarah [name] Entered By: Sarah [name] on 08/04/2019 11:23:14 -------------------------------------------------------------------------------- Compression Therapy Details Patient Name: Date of Service: Sarah [name] 08/04/2019 11:15 AM Medical Record [id number] Patient Account Number: [id number] Date of Birth/Sex: Treating RN: 03-Apr-1933 (86 y.o. Sarah [name] Primary Care Merilee Wible: Sarah [name] Other Clinician: Referring Sarah [name]: Treating Sarah [name]/Extender:Sarah [name] in Treatment: 11 Compression Therapy Performed for Wound Wound #1 Left,Anterior Lower  Leg Assessment: Performed By: Clinician Sarah Hurst, RN Compression Type: Three Layer Post Procedure Diagnosis Same as Pre-procedure Electronic Signature(s) Signed: 08/04/2019 5:44:18 PM By: Sarah [name] Entered By: Sarah [name] on 08/04/2019 11:45:34 -------------------------------------------------------------------------------- Encounter Discharge Information Details Patient Name: Date of Service: Sarah [name] 08/04/2019 11:15 AM Medical Record [id number] Patient Account Number: [id number] Date of Birth/Sex: Treating RN: 1932/11/23 (86 y.o. Sarah [name] Primary Care Delvis Kau: Sarah [name] Other Clinician: Referring Sarah [name]: Treating Lillie Bollig/Extender:Sarah [name], Sarah [name], Sarah [name] in Treatment: 11 Encounter Discharge Information Items Discharge Condition: Stable Ambulatory Status: Ambulatory Discharge Destination: Home Transportation: Private Auto Accompanied By: daughter Schedule Follow-up Appointment: Yes Clinical Summary of Care: Electronic Signature(s) Signed: 08/04/2019 5:25:09 PM By: Sarah [name] Entered By: Sarah [name] on 08/04/2019 12:03:54 -------------------------------------------------------------------------------- Lower Extremity Assessment Details Patient Name: Date of Service: Sarah [name], Sarah [name] 08/04/2019 11:15 AM Medical Record [id number] Patient Account Number: [id number] Date of Birth/Sex: Treating RN: March 06, 1933 (86 y.o. Sarah [name] Primary Care Dulcy Sida: Sarah [name] Other Clinician: Referring Mikenna Bunkley: Treating Sarah [name]/Extender:Sarah [name] in Treatment: 11 Edema Assessment Assessed: [Left: No] [Right: No] Edema: [Left: Ye] [Right: s] Calf Left: Right: Point of Measurement: 36 cm From Medial Instep 27 cm cm Ankle Left: Right: Point of Measurement: 9 cm From Medial Instep 20 cm cm Electronic Signature(s) Signed: 08/04/2019 5:26:01 PM By: Sarah Coria  RN Entered By: Sarah [name] on 08/04/2019 11:37:17 -------------------------------------------------------------------------------- Multi Wound Chart Details Patient Name: Date of Service: Sarah [name] 08/04/2019 11:15 AM Medical Record [id number] Patient Account Number: [id number] Date of Birth/Sex: Treating RN: [birth year] (86 y.o. Sarah [name] Primary Care Sarah [name]: Sarah [name] Other Clinician: Referring Sarah [name]: Treating Sarah [name]/Extender:Sarah [name] in Treatment: 11 Vital Signs Height(in): 64 Pulse(bpm): 75 Weight(lbs): 111 Blood Pressure(mmHg): 123/74 Body Mass Index(BMI): 19 Temperature(F): 97.6 Respiratory 18 Rate(breaths/min): Photos: [1:No Photos] [N/A:N/A] Wound Location: [1:Left, Anterior Lower Leg] [N/A:N/A] Wounding Event: [1:Gradually Appeared] [N/A:N/A] Primary Etiology: [1:Venous Leg Ulcer] [N/A:N/A] Comorbid History: [1:Hypertension, Received Chemotherapy, Received Radiation] [N/A:N/A] Date  Acquired: [1:12/21/2018] [N/A:N/A] [name] of Treatment: [1:11] [N/A:N/A] Wound Status: [1:Open] [N/A:N/A] Measurements L x W x D 1.5x1.2x0.1 [N/A:N/A] (cm) Area (cm) : [1:1.414] [N/A:N/A] Volume (cm) : [1:0.141] [N/A:N/A] % Reduction in Area: [1:50.00%] [N/A:N/A] % Reduction in Volume: 83.40% [N/A:N/A] Classification: [1:Full Thickness Without Exposed Support Structures] [N/A:N/A] Exudate Amount: [1:Medium] [N/A:N/A] Exudate Type: [1:Serosanguineous] [N/A:N/A] Exudate Color: [1:red, brown] [N/A:N/A] Wound Margin: [1:Flat and Intact] [N/A:N/A] Granulation Amount: [1:Medium (34-66%)] [N/A:N/A] Granulation Quality: [1:Pink] [N/A:N/A] Necrotic Amount: [1:Medium (34-66%)] [N/A:N/A] Exposed Structures: [1:Fat Layer (Subcutaneous Tissue) Exposed: Yes Fascia: No Tendon: No Muscle: No Joint: No Bone: No] [N/A:N/A] Epithelialization: [1:Small (1-33%) Compression Therapy] [N/A:N/A N/A] Treatment Notes Wound #1 (Left, Anterior  Lower Leg) 1. Cleanse With Wound Cleanser Soap and water 2. Periwound Care Moisturizing lotion 3. Primary Dressing Applied Iodoflex 4. Secondary Dressing Dry Gauze 6. Support Layer Applied 3 layer compression wrap Notes netting Electronic Signature(s) Signed: 08/04/2019 5:44:18 PM By: Sarah [name] Signed: 08/05/2019 10:32:20 AM By: Linton Ham MD Entered By: Linton Ham on 08/04/2019 12:19:13 -------------------------------------------------------------------------------- Multi-Disciplinary Care Plan Details Patient Name: Date of Service: Sarah [name], Sarah [name] 08/04/2019 11:15 AM Medical Record [id number] Patient Account Number: [id number] Date of Birth/Sex: Treating RN: 30-Dec-1932 (86 y.o. Sarah [name] Primary Care Cortez Flippen: Sarah [name] Other Clinician: Referring Raylen Ken: Treating Shlomie Romig/Extender:Sarah [name] in Treatment: 11 Active Inactive Venous Leg Ulcer Nursing Diagnoses: Knowledge deficit related to disease process and management Potential for venous Insuffiency (use before diagnosis confirmed) Goals: Patient will maintain optimal edema control Date Initiated: 05/19/2019 Target Resolution Date: 08/15/2019 Goal Status: Active Patient/caregiver will verbalize understanding of disease process and disease management Date Initiated: 05/19/2019 Date Inactivated: 06/16/2019 Target Resolution Date: 06/20/2019 Goal Status: Met Interventions: Assess peripheral edema status every visit. Provide education on venous insufficiency Notes: Wound/Skin Impairment Nursing Diagnoses: Impaired tissue integrity Knowledge deficit related to ulceration/compromised skin integrity Goals: Patient/caregiver will verbalize understanding of skin care regimen Date Initiated: 05/19/2019 Target Resolution Date: 08/15/2019 Goal Status: Active Ulcer/skin breakdown will have a volume reduction of 30% by week 4 Date Initiated: 05/19/2019  Date Inactivated: 06/16/2019 Target Resolution Date: 06/20/2019 Goal Status: Met Ulcer/skin breakdown will have a volume reduction of 50% by week 8 Date Initiated: 06/16/2019 Date Inactivated: 07/14/2019 Target Resolution Date: 07/18/2019 Unmet Reason: necrotic Goal Status: Unmet surface, pt will not allow sharp debridement Interventions: Assess patient/caregiver ability to obtain necessary supplies Assess patient/caregiver ability to perform ulcer/skin care regimen upon admission and as needed Assess ulceration(s) every visit Provide education on ulcer and skin care Notes: Electronic Signature(s) Signed: 08/04/2019 5:44:18 PM By: Sarah [name] Entered By: Sarah [name] on 08/04/2019 11:35:53 -------------------------------------------------------------------------------- Pain Assessment Details Patient Name: Date of Service: Sarah [name] 08/04/2019 11:15 AM Medical Record [id number] Patient Account Number: [id number] Date of Birth/Sex: Treating RN: 1932/08/20 (86 y.o. Sarah [name] Primary Care Carloyn Lahue: Sarah [name] Other Clinician: Referring Avrey Flanagin: Treating Cendy Oconnor/Extender:Sarah [name] in Treatment: 11 Active Problems Location of Pain Severity and Description of Pain Patient Has Paino No Site Locations Pain Management and Medication Current Pain Management: Electronic Signature(s) Signed: 08/04/2019 5:44:18 PM By: Sarah [name] Signed: 09/03/2019 9:21:08 AM By: Sarah [name] Entered By: Sarah [name] on 08/04/2019 11:23:38 -------------------------------------------------------------------------------- Patient/Caregiver Education Details Patient Name: Date of Service: Sarah [name] 3/15/2021andnbsp11:15 AM Medical Record [id number] Patient Account Number: [id number] Date of Birth/Gender: Treating RN: 25-May-1932 (86 y.o. Sarah [name] Primary Care Physician: Sarah [name] Other  Clinician: Referring Physician: Treating Physician/Extender:Sarah [name], Legrand Como  [place], Sarah [name] in Treatment: 11 Education Assessment Education Provided To: Patient Education Topics Provided Wound/Skin Impairment: Methods: Explain/Verbal Responses: State content correctly [employer]) Signed: 08/04/2019 5:44:18 PM By: Sarah [name] Entered By: Sarah [name] on 08/04/2019 11:36:05 -------------------------------------------------------------------------------- Wound Assessment Details Patient Name: Date of Service: Sarah [name] 08/04/2019 11:15 AM Medical Record [id number] Patient Account Number: [id number] Date of Birth/Sex: Treating RN: 1932-10-12 (86 y.o. Sarah [name] Primary Care Christon Gallaway: Sarah [name] Other Clinician: Referring Fredrik Mogel: Treating Caide Campi/Extender:Sarah [name] in Treatment: 11 Wound Status Wound Number: 1 Primary Venous Leg Ulcer Etiology: Wound Location: Left Lower Leg - Anterior Wound Open Wounding Event: Gradually Appeared Status: Date Acquired: 12/21/2018 Comorbid Hypertension, Received Chemotherapy, [name] Of Treatment: 11 History: Received Radiation Clustered Wound: No Photos Wound Measurements Length: (cm) 1.5 % Reduct Width: (cm) 1.2 % Reduct Depth: (cm) 0.1 Epitheli Area: (cm) 1.414 Tunneli Volume: (cm) 0.141 Undermi Wound Description Classification: Full Thickness Without Exposed Support Foul Od Structures Slough/ Wound Flat and Intact Margin: Exudate Medium Amount: Exudate Serosanguineous Type: Exudate red, brown Color: Wound Bed Granulation Amount: Medium (34-66%) Granulation Quality: Pink Fascia Necrotic Amount: Medium (34-66%) Fat Layer Necrotic Quality: Adherent Slough Tendon Ex Muscle Ex Joint Exp Bone Expo or After Cleansing: No Fibrino Yes Exposed Structure Exposed: No (Subcutaneous Tissue) Exposed: Yes posed: No posed: No osed: No sed:  No ion in Area: 50% ion in Volume: 83.4% alization: Small (1-33%) ng: No ning: No Electronic Signature(s) Signed: 08/05/2019 4:45:30 PM By: Mikeal Hawthorne EMT/HBOT Signed: 08/06/2019 8:57:53 AM By: Sarah Coria RN Previous Signature: 08/04/2019 5:26:01 PM Version By: Sarah Coria RN Entered By: Mikeal Hawthorne on 08/05/2019 15:03:25 -------------------------------------------------------------------------------- Vitals Details Patient Name: Date of Service: Sarah [name] 08/04/2019 11:15 AM Medical Record [id number] Patient Account Number: [id number] Date of Birth/Sex: Treating RN: Mar 16, 1933 (85 y.o. Sarah [name] Primary Care Jaloni Davoli: Sarah [name] Other Clinician: Referring Blas Riches: Treating Jatoya Armbrister/Extender:Sarah [name] in Treatment: 11 Vital Signs Time Taken: 11:23 Temperature (F): 97.6 Height (in): 64 Pulse (bpm): 75 Weight (lbs): 111 Respiratory Rate (breaths/min): 18 Body Mass Index (BMI): 19.1 Blood Pressure (mmHg): 123/74 Reference Range: 80 - 120 mg / dl Electronic Signature(s) Signed: 09/03/2019 9:21:08 AM By: Sarah [name] Entered By: Sarah [name] on 08/04/2019 11:23:32

## 2019-09-04 ENCOUNTER — Telehealth: Payer: Self-pay | Admitting: *Deleted

## 2019-09-04 DIAGNOSIS — C8440 Peripheral T-cell lymphoma, not classified, unspecified site: Secondary | ICD-10-CM

## 2019-09-04 NOTE — Progress Notes (Signed)
HEMATOLOGY/ONCOLOGY CLINIC NOTE  Date of Service: 09/05/2019  Patient Care Team: Lajean Manes, MD as PCP - General (Internal Medicine) Buford Dresser, MD as PCP - Cardiology (Cardiology)  REFERRING PHYSICIAN: Lajean Manes, MD  CHIEF COMPLAINTS/PURPOSE OF CONSULTATION:  Continue mx of Recently Diagnosed Angioimmunoblastic T cell lymphoma      HISTORY OF PRESENTING ILLNESS:   Sarah [name] is a wonderful 86 y.o. female who has been referred to [place] by Lajean Manes, MD for evaluation and management of Possible lymphoma . She is accompanied today by her daughter Sarah [name] . The pt reports that she is doing well overall.   Pending Surgical pathology from 03/17/2019 Dr.Manny will contact clinic about pathology   August 25th/27th she began coughing and sneezing. On Sept 3 went to doctors and they said it was allergies. She began having rashes (little red dots). Went to PCP and they prescribed prednisone and for several days and the medication helped. But as soon as she completed the prescription symptoms reoccurred.   She had a chest X Ray done because she was having a severe cough on 02/17/2019 revealing "bilateral effusions in this patient with history of breast cancer,of uncertain significance with question of right lower lobe pulmonary nodule, consider chest CT for further assessment. Atherosclerotic changes in the thoracic aorta."  Dr. Amy Martinique biopsied the rashes on her back 02/21/2019 and it was determined that it could be because of an allergy to medication. Dr.Stoneking stopped her blood pressure medication from September- October but ther rash still present. The itching started 1 to 2 months ago. The rash would come and go and was mostly on her back. She still has rash on her left thigh.  She is using triaconazole and cetrizine.  She has lumps behind ear and abdomin and she states that they have swollen twice the size within 1 and half months  She has no appetite and  has changes in her taste that began around August. She has changes in bowels. They are more loose and smaller In size. She also has no control over bowels.  She feels fatigued and it has increased since August.   She has a cough that starts in September  that has made her horse.   She had a CT scan on 02/27/2019 because she was having severe cough and her stomach was very extended. Revealing "1. Extensive lymphadenopathy throughout the chest, abdomen, and pelvis, with index lymph nodes identified above. Splenomegaly. Findings are most consistent with lymphoma with recurrent, metastatic breast malignancy less favored. 2. There is generally osteopenic appearance of the skeleton. There are numerous subtle hypodense lesions, particularly of the pelvis (e.g. Series 2, image 88) and select vertebral bodies, for example L4 (series 8, image 92). This is suspicious although not definitive for osseous metastatic disease. Characterization for metabolic activity by nuclear scintigraphic bone scan or PET-CT would be helpful to further evaluate. 3. Moderate right, small left pleural effusions with associated atelectasis or consolidation. Subpleural radiation fibrosis of the anterior right lung. 4. There is a new subpleural pulmonary nodule of the anterior right middle lobe measuring 6 mm (series 4, image 105), nonspecific. There is no obvious pleural thickening or nodularity definitive for metastatic disease. 5.  Small volume ascites. 6. There is a fluid attenuation lesion of the pancreatic uncinate measuring 2.4 x 1.3 cm (series 2, image 63), not substantially changed when compared to remote prior MR examination dated 02/23/2012 and likely sequelae of a prior pseudocyst versus incidental pancreatic IPMN. 7.  Cardiomegaly."  Hospitalized from 03/06/2019-03/08/2019. She presenting with acute shortness of breath, leg swelling,left hand swelling and some weight gain despite poor appetite. In ED, CTA chest revealed at least  submassive PE, moderate right pleural effusion, diffuse LAD concerning for lymphoma. Started on heparin drip. PCCM consulted for guidance. She had thoracocentesis with removal of 1200 cc cloudy exudative pleural fluid. Cardiology consulted for elevated which was thought to be demand ischemia from right heart strain in the setting of PE. Patient was transitioned to subcu Lovenox by pulmonology the next day and remained stable. Unusual appearance of tracheal air column at the thoracic inlet. Correlate with any history of swallowing difficulty or changes in voice with dedicated imaging with chest CT and or CT of the neck as indicated.  She has a nonhealing ulcer on her right tibialis anterior that starting weeping one week ago. Of note since the patient's last visit, pt has had CHEST XRAY - 2 VIEW (Accession [id number]) completed on 02/17/2019 with results revealing "Bilateral effusions in this patient with history of breast cancer,of uncertain significance with question of right lower lobepulmonary nodule, consider chest CT for furtherassessment.Unusual appearance of tracheal air column at the thoracic inlet.Correlate with any history of swallowing difficulty or changes in voice with dedicated imaging with chest CT and or CT of the neck as Indicated. Atherosclerotic changes in the thoracic aorta."  Of note since the patient's last visit, pt has had CT CHEST, ABDOMEN, AND PELVIS WITH CONTRAST (Accession [id number]) completed on 02/27/2019 with results revealing "1. Extensive lymphadenopathy throughout the chest, abdomen, and pelvis, with index lymph nodes identified above. Splenomegaly.Findings are most consistent with lymphoma with recurrent, metastatic breast malignancy less favored. 2. There is generally osteopenic appearance of the skeleton. There are numerous subtle hypodense lesions, particularly of the pelvis (e.g. Series 2, image 88) and select vertebral bodies, for example L4 (series 8, image 92).  This is suspicious although not definitive for osseous metastatic disease. Characterization for metabolic activity by nuclear scintigraphic bone scan or PET-CT would be helpful to further evaluate.  3. Moderate right, small left pleural effusions with associated atelectasis or consolidation. Subpleural radiation fibrosis of the anterior right lung.4. There is a new subpleural pulmonary nodule of the anterior right middle lobe measuring 6 mm (series 4, image 105), nonspecific. There is no obvious pleural thickening or nodularity definitive for metastatic disease.5.  Small volume ascites. 6. There is a fluid attenuation lesion of the pancreatic uncinate measuring 2.4 x 1.3 cm (series 2, image 63), not substantially changed when compared to remote prior MR examination dated 02/23/2012 and likely sequelae of a prior pseudocyst versus incidental pancreatic IPMN.7.  Cardiomegaly.8. Other chronic and incidental findings as detailed above. Aortic Atherosclerosis (ICD10-I70.0)."  Of note since the patient's last visit, pt has had PORTABLE CHEST 1 VIEW (Accession [id number]) completed on 03/06/2019 with results revealing "1. Moderate size right and small left pleural effusions appear not significantly changed since 02/27/19. 2. No new cardiopulmonary abnormality."  Of note since the patient's last visit, pt has had ECHOCARDIOGRAM  completed on 03/06/2019 with results revealing  "1. Left ventricular ejection fraction, by visual estimation, is 60 to 65%. The left ventricle has normal function. Normal left ventricular size. There is no left ventricular hypertrophy. 2. Left ventricular diastolic Doppler parameters are consistent with impaired relaxation pattern of LV diastolic filling. 3. Global right ventricle has mildly reduced systolic function.The right ventricular size is moderately enlarged. No increase in right ventricular wall thickness. 4. Left atrial size was normal.  5. Right atrial size was mildly dilated.  6. Trivial pericardial effusion is present. 7. The mitral valve is normal in structure. Trace mitral valve regurgitation. No evidence of mitral stenosis. 8. The tricuspid valve is normal in structure. Tricuspid valve regurgitation severe. 9. The aortic valve is normal in structure. Aortic valve regurgitation was not visualized by color flow Doppler. Structurally normal aortic valve, with no evidence of sclerosis or stenosis.10. The pulmonic valve was normal in structure. Pulmonic valve regurgitation is mild by color flow Doppler.11. Mildly elevated pulmonary artery systolic pressure.12. The inferior vena cava is normal in size with greater than 50% respiratory variability, suggesting right atrial pressure of 3 mmHg."  Of note since the patient's last visit, pt has had CT ANGIOGRAPHY CHEST WITH CONTRAST (Accession [id number]) completed on 03/06/2019 with results revealing "1. Pulmonary embolus arising from the distal left main pulmonary artery with extension into multiple left lower lobe pulmonary arterial branches. There is also incompletely obstructing pulmonary embolus in the proximal left upper lobe pulmonary artery. More distal pulmonary embolus in the right lower lobe pulmonary artery. Positive for acute PE with CT evidence of right heart strain (RV/LV Ratio = 1.6) consistent with at least submassive (intermediate risk) PE. The presence of right heart strain has been associated with an increased risk of morbidity and mortality. Please activate Code PE by paging [phone]. 2. Sizable pleural effusion on the right with smaller pleural effusion on the left. Consolidation and compressive atelectasis throughout most of the right lower lobe. Milder atelectasis left base. Nodular opacities on the right, likely metastatic foci.3.  Stable adenopathy compared to 1 week prior.4. Enlargement of spleen, incompletely visualized but documented CT 1 week prior. Concern for potential lymphoma. 5. Aortic atherosclerosis.  No aneurysm or dissection evident. Foci of coronary artery calcification noted."  Most recent lab results (03/06/2019) of CBC is as follows: all values are WNL except for Platelets at 72, Eosinophils Absolute at 0.6,Abs Immature Granulocytes at 0.08 .  On review of systems, pt reports rashes, digestive issues and denies back pain, abdominal pain and any other symptoms.   On PMHx the pt reports Hypothyroidism, MCI, Hypercholesterolemia, Tracheal anomaly, Hypertension, Pulmonary nodule, Atherosclerosis of aorta, anxiety, gait disorder, primary insomnia.   INTERVAL HISTORY:  Sarah [name] is a 86 y.o. female here for evaluation and management of Angioimmunoblastic T-cell lymphoma. She is here for C6D1 of A-AVD. We are joined by her daughter, Sarah [name], today The patient's last visit with [place] was on 08/05/19. The pt reports that she is doing well overall.  The pt reports she is feeling good. She has a rash all over her body. She noticed it 2-3 days ago. The rash started on the leg and is now all over. No new medications, detergents or changes out of the normal. The rash has been itchy. Pt has had both doses of COVID19 vaccine. She had bloody discharge from the nipple on the right side a week ago, but that was a one time occurrence. She is not taking OTC herbal supplements. Pt had a venous ulcer on the left lower leg. She has been wearing compression socks.   Lab results today (09/05/19) of CBC w/diff and CMP is as follows: all values are WNL except for Glucose at 118, AST at 13. 09/05/19 of LDH at 215 09/05/19 of MMP showed no monoclonal paraproteinemia.  On review of systems, pt reports rash all over body, cough/sneezing, lump/bump on back of head, bloody discharge from right nipple, right breast tenderness in 12  o'clock position and denies fever, chill, night sweats, loss of appetite, changes in breathing, sore throat, abdominal pain, diarrhea, irregular bowl movements and any other symptoms.     MEDICAL HISTORY:  Past Medical History:  Diagnosis Date  . Allergy    codeine, thiazides  . Anxiety    new dx  . Arthritis   . Breast cancer ([place]) 03/14/12   bx=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,ER/PR=+,upper inner quad  . Cancer ([place])    breast  . Cataract   . DVT (deep venous thrombosis) ([place]) 02/2019   left leg  . Dyspnea   . Edema    both legs feet and toe, abdomen  . Glaucoma    laser treated years ago  . HOH (hard of hearing)   . Hyperlipemia   . Hypertension   . Hypothyroidism   . Pancreatic cyst    benign  . PONV (postoperative nausea and vomiting)   . Pulmonary embolism ([place]) 02/2019   bilateral   . Radiation 06/11/2012-07/12/2012   17 sessions 4250 cGy, 3 sessions 750 cGy  . Vertigo   . Wears glasses   . Wears partial dentures    partial upper     SURGICAL HISTORY: Past Surgical History:  Procedure Laterality Date  . ABDOMINAL HYSTERECTOMY  1966   1/2 ovary left in   . BREAST SURGERY  1992   lumpectomy-lt  . CATARACT EXTRACTION     b/l  . COLONOSCOPY    . EXCISION MASS NECK Left 03/17/2019    EXCISION MASS NECK (Left Neck)  . EXCISION MASS NECK Left 03/17/2019   Procedure: EXCISION MASS NECK;  Surgeon: Helayne Seminole, MD;  Location: [facility];  Service: ENT;  Laterality: Left;  . EYE SURGERY Bilateral    bilateral cataract removal  . IR IMAGING GUIDED PORT INSERTION  04/23/2019  . JOINT REPLACEMENT  2013   rt total knee  . JOINT REPLACEMENT  1995   lt total knee  . PARTIAL MASTECTOMY WITH NEEDLE LOCALIZATION  04/09/2012   Procedure: PARTIAL MASTECTOMY WITH NEEDLE LOCALIZATION;  Surgeon: Adin Hector, MD;  Location: [facility];  Service: General;  Laterality: Right;  . SKIN BIOPSY Left 03/17/2019   LEFT THIGH  . SKIN BIOPSY Left 03/17/2019   Procedure: Skin Biopsy Left Thigh;  Surgeon: Helayne Seminole, MD;  Location: [place];  Service: ENT;  Laterality: Left;  . TONSILLECTOMY    . TOTAL KNEE  ARTHROPLASTY  08/16/2011   Procedure: TOTAL KNEE ARTHROPLASTY;  Surgeon: Ninetta Lights, MD;  Location: [facility];  Service: Orthopedics;  Laterality: Right;     SOCIAL HISTORY: Social History   Socioeconomic History  . Marital status: Widowed    Spouse name: Not on file  . Number of children: 2  . Years of education: Not on file  . Highest education level: Not on file  Occupational History  . Occupation: Retired    Comment: Community education officer   Tobacco Use  . Smoking status: Never Smoker  . Smokeless tobacco: Never Used  Substance and Sexual Activity  . Alcohol use: No  . Drug use: No  . Sexual activity: Not Currently  Other Topics Concern  . Not on file  Social History Narrative  . Not on file   Social Determinants of Health   Financial Resource Strain:   . Difficulty of Paying Living Expenses:   Food Insecurity:   . Worried About Charity fundraiser in the Last Year:   .  Ran Out of Food in the Last Year:   Transportation Needs:   . Film/video editor (Medical):   Marland Kitchen Lack of Transportation (Non-Medical):   Physical Activity:   . Days of Exercise per Week:   . Minutes of Exercise per Session:   Stress:   . Feeling of Stress :   Social Connections:   . Frequency of Communication with Friends and Family:   . Frequency of Social Gatherings with Friends and Family:   . Attends Religious Services:   . Active Member of Clubs or Organizations:   . Attends Archivist Meetings:   Marland Kitchen Marital Status:   Intimate Partner Violence:   . Fear of Current or Ex-Partner:   . Emotionally Abused:   Marland Kitchen Physically Abused:   . Sexually Abused:      FAMILY HISTORY: Family History  Problem Relation Age of Onset  . Heart disease Father   . Cancer Maternal Aunt        stomach     ALLERGIES:   is allergic to codeine; latex; and thiazide-type diuretics.   MEDICATIONS:  Current Outpatient Medications  Medication Sig Dispense Refill  . amLODipine (NORVASC) 2.5 MG  tablet Take 2.5-5 mg by mouth See admin instructions. 5 mg in the morning, 2.5 mg in the evening    . apixaban (ELIQUIS) 5 MG TABS tablet Take 1 tablet (5 mg total) by mouth 2 (two) times daily. 180 tablet 0  . Cholecalciferol (VITAMIN D) 50 MCG (2000 UT) tablet Take 2,000 Units by mouth 2 (two) times daily.    . Cyanocobalamin (VITAMIN B-12 PO) Take 3,000 mcg by mouth daily.     Marland Kitchen levothyroxine (SYNTHROID) 25 MCG tablet Take 25 mcg by mouth daily before breakfast.     . lidocaine-prilocaine (EMLA) cream Apply to affected area once 30 g 3  . LORazepam (ATIVAN) 0.5 MG tablet Take 0.5-1 tablets (0.25-0.5 mg total) by mouth every 8 (eight) hours. 30 tablet 0  . Multiple Vitamins-Minerals (PRESERVISION AREDS PO) Take 1 capsule by mouth 2 (two) times daily.     . ondansetron (ZOFRAN) 4 MG tablet Take 4 mg by mouth 2 (two) times daily as needed.    . ondansetron (ZOFRAN) 8 MG tablet Take 1 tablet (8 mg total) by mouth 2 (two) times daily as needed. Start on the third day after chemotherapy. 30 tablet 1  . polyethylene glycol (MIRALAX / GLYCOLAX) 17 g packet Take 17 g by mouth daily.    . prochlorperazine (COMPAZINE) 10 MG tablet Take 1 tablet (10 mg total) by mouth every 6 (six) hours as needed (Nausea or vomiting). 30 tablet 1  . SANTYL ointment     . zolpidem (AMBIEN) 10 MG tablet Take 2.5 mg by mouth at bedtime.     . gabapentin (NEURONTIN) 100 MG capsule Take 2 capsules (200 mg total) by mouth at bedtime. (Patient not taking: Reported on 07/16/2019) 60 capsule 2  . predniSONE (DELTASONE) 20 MG tablet Take 1 tablet (20 mg total) by mouth daily with breakfast. 10 tablet 0   No current facility-administered medications for this visit.     REVIEW OF SYSTEMS:   A 10+ POINT REVIEW OF SYSTEMS WAS OBTAINED including neurology, dermatology, psychiatry, cardiac, respiratory, lymph, extremities, GI, GU, Musculoskeletal, constitutional, breasts, reproductive, HEENT.  All pertinent positives are noted in the  HPI.  All others are negative.   PHYSICAL EXAMINATION: ECOG FS:1 - Symptomatic but completely ambulatory  Vitals:   09/05/19 1155  BP: 134/78  Pulse: 81  Resp: 18  Temp: 99.1 F (37.3 C)  SpO2: 100%   Wt Readings from Last 3 Encounters:  09/05/19 117 lb 4.8 oz (53.2 kg)  08/05/19 113 lb 9.6 oz (51.5 kg)  07/16/19 110 lb 6.4 oz (50.1 kg)   Body mass index is 20.13 kg/m.    GENERAL:alert, in no acute distress and comfortable SKIN: rashes, no significant lesions, ulcer on right breast, 12 oclock position right breast tenderness EYES: conjunctiva are pink and non-injected, sclera anicteric OROPHARYNX: MMM, no exudates, no oropharyngeal erythema or ulceration NECK: supple, no JVD LYMPH:  palpable lymphadenopathy in the cervical 1 cm in size, no palpable lymphadenopathy axillary or inguinal regions LUNGS: clear to auscultation b/l with normal respiratory effort HEART: regular rate & rhythm ABDOMEN:  normoactive bowel sounds , non tender, not distended. Extremity: no pedal edema PSYCH: alert & oriented x 3 with fluent speech NEURO: no focal motor/sensory deficits  LABORATORY DATA:  I have reviewed the data as listed  CBC Latest Ref Rng & Units 09/05/2019 08/05/2019 07/16/2019  WBC 4.0 - 10.5 K/uL 4.8 5.0 7.1  Hemoglobin 12.0 - 15.0 g/dL 13.5 13.0 12.9  Hematocrit 36.0 - 46.0 % 41.5 41.2 39.6  Platelets 150 - 400 K/uL 184 160 209    CMP Latest Ref Rng & Units 09/05/2019 08/05/2019 07/16/2019  Glucose 70 - 99 mg/dL 118(H) 111(H) 104(H)  BUN 8 - 23 mg/dL 22 18 22   Creatinine 0.44 - 1.00 mg/dL 0.72 0.69 0.71  Sodium 135 - 145 mmol/L 142 144 144  Potassium 3.5 - 5.1 mmol/L 4.2 3.8 3.4(L)  Chloride 98 - 111 mmol/L 107 108 107  CO2 22 - 32 mmol/L 25 25 27   Calcium 8.9 - 10.3 mg/dL 9.4 9.1 9.4  Total Protein 6.5 - 8.1 g/dL 6.8 6.4(L) 6.6  Total Bilirubin 0.3 - 1.2 mg/dL 0.5 0.5 0.6  Alkaline Phos 38 - 126 U/L 89 64 85  AST 15 - 41 U/L 13(L) 13(L) 11(L)  ALT 0 - 44 U/L 11 10 9     Component     Latest Ref Rng & Units 03/27/2019  Hepatitis B Surface Ag     NON REACTIVE NON REACTIVE  Hep B Core Total Ab     NON REACTIVE NON REACTIVE  HCV Ab     NON REACTIVE NON REACTIVE  LDH     98 - 192 U/L 252 (H)   . Lab Results  Component Value Date   LDH 215 (H) 09/05/2019      04/08/2019 NM PET Image Initial (PI) Skull Base To Thigh (Accession [id number])  04/04/2019 PATHOLOGY   04/07/2019 ECHOCARDIOGRAM  03/28/2019 PATHOLOGY   03/06/2019 CT ANGIOGRAPHY CHEST WITH CONTRAST (Accession [id number])  04/07/2019 ECHOCARDIOGRAM  03/06/2019 ECHOCARDIOGRAM     03/06/2019 PORTABLE CHEST 1 VIEW (Accession [id number])    02/27/2019 CT CHEST, ABDOMEN, AND PELVIS WITH CONTRAST (Accession [id number])    02/17/2019 CHEST XRAY - 2 VIEW (Accession [id number])    RADIOGRAPHIC STUDIES: I have personally reviewed the radiological images as listed and agreed with the findings in the report. No results found.   ASSESSMENT & PLAN:   SHARIE AMORIN is a 86 y.o. female with:  1. 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 SKIN, LEFT THIGH, BIOPSY: - Involvement by angioimmunoblastic T-cell lymphoma." -04/07/2019 Echocardiogram - normal EF -NM PET Image Initial (PI) Skull Base To Thigh (Accession [id number]) completed on 04/08/2019  with results revealing "1. Widespread hypermetabolic adenopathy in the neck, chest, and abdomen/pelvis, primarily in the Deauville 4 and Deauville 5 range. There is also hypermetabolic activity along the posterior nasopharynx in lingual tonsillar regions potentially indicating sites of involvement. 2. The subtle hypodensities in the spine and bony pelvis are not appreciably hypermetabolic may be incidental, surveillance is suggested. 3. Bilateral thyroid activity but especially on the left. Probably from  thyroiditis. 4. A 6 mm subpleural nodule anteriorly in the right lower lobe not appreciably hypermetabolic but is below sensitive PET-CT size thresholds and merits surveillance. 5. Mildly accentuated diffuse splenic activity without splenomegaly or focal splenic lesion identified. 6. Other imaging findings of potential clinical significance: Aortic Atherosclerosis (ICD10-I70.0). Coronary atherosclerosis. Large right and small left pleural effusions. Moderate cardiomegaly. Small pericardial effusion. Mild mesenteric edema. Large left renal cyst. Fullness of both renal collecting systems with suspected nonobstructive left nephrolithiasis. Degenerative grade 1 anterolisthesis at L4-5 at L5-S1. Trace pelvic ascites."  05/28/2019 PET/CT ([id number]) revealed "Complete metabolic response to therapy".   2. Pulmonary Embolism -extensive .likely related to extensive malignancy  3. H/o Breast Cancer  -The patient had bilateral diagnostic  mammography at [facility] 07/11/2011. This  showed some calcifications in the right  breast, which seemed a little bit more  prominent than prior. In the left breast  there was an area of possible  architectural distortion. However left  breast ultrasound the same day showed  no abnormality. 6 month followup was  suggested, and on 02/28/2012 the  patient again had bilateral diagnostic  mammography, now with right  ultrasonography. The microcalcifications  in the right breast appeared increased.  Ultrasound showed a hypoechoic lesion  measuring 5 mm, with no associated  shadowing. This had been previously  noted and appeared unchanged. Anterior  to that there was a small hypoechoic  mass measuring 4 mm in diameter.  There was felt to be suspicious, and on  03/14/2012 the patient underwent biopsy  of the right breast mass, showing ([id number]  [id number]) ductal carcinoma in situ,  intermediate grade, estrogen receptor  100% and progesterone receptor 100%  positive.   -Bilateral breast MRI was  obtained  03/26/2012. This showed only post  biopsy changes in the right breast,  associated with an area of non-masslike  enhancement measuring 2.4 cm. The  left breast was unremarkable, and there  was no enlarged axillary or internal  mammary adenopathy noted.  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 The patient's  subsequent history is as detailed below.   4. Rt breast discomfort on palpable , nipple ulceration and bloody discharge. PLAN: -Discussed pt labwork today, 09/05/19; of CBC w/diff and CMP is as follows: all values are WNL except for Glucose at 118, AST at 13. -Discussed 09/05/19 of LDH at 215 -Discussed 09/05/19 of MMP - no monoclonal paraproteinemia. -Advised on rash all over body  -Advised on breast tenderness -Advised on possibility of food/environmental allergies  -Advised pt that the risk of relapse is highest in the first two years and that the risk of relapse is about 40-50%. -Will get oral steroids and antihistamine- 20mg  daily prednisone (mornings), 10 mg daily Claritin/Zyrtec, 20 mg daily  pepcid (famotidine)  -Will get PET/CT  -Will get mammogram with tomography and [place] to evaluate rt breast concerns -if no evidence of lymphoma progression on PET/CT and  rash persistent -- might need rptskin biopsy with her dermatologist.   FOLLOW-UP: -f/u for PET/CT on 09/16/2019 - may call to see it this can  be done sooner. -Mammogram b/l with tomo and rt breast ultrasound at [facility] (paper prescription given) RTC with Dr Irene Limbo in 2 weeks  The total time spent in the appt was 30 minutes and more than 50% was on counseling and direct patient cares.  All of the patient's questions were answered with apparent satisfaction. The patient knows to call the clinic with any problems, questions or concerns.    Sullivan Lone MD MS  AAHIVMS [facility] Hematology/Oncology Physician [facility]  (Office):       [phone] (Work cell):  [phone] (Fax):           [phone]  09/05/2019 1:08 PM  I, Dawayne Cirri am acting as a Education administrator for Dr. Sullivan Lone.   .I have reviewed the above documentation for accuracy and completeness, and I agree with the above. Brunetta Genera MD

## 2019-09-04 NOTE — Telephone Encounter (Signed)
Patient daughter Sarah [name]) called 4/14 AM: Mother has developed rash like last time when lymphoma diagnosed. Started on right leg where wound was (now healed). Also has some red streaks on that leg. Rash spread up to back and patient is now coughing as when lymphoma diagnosed. Dr. Irene Limbo informed and recommended patient be seen by PCP on 4/14 if possible for evaluation. Contacted dawn with these directions - she verbalized understanding.  Patient's daughter called this 4/15 AM. Mother saw PCP on 4/14, and PCP recommended she see Dr. Irene Limbo asap, thinks it is realted to  Lymphoma. Rash has extended to back, chest and neck. Cough is more pronounced. Patient scheduled for Dr. Irene Limbo 4/16 11:40am with labs at 11 am. [name] verbalized understanding of appointment times.

## 2019-09-05 ENCOUNTER — Other Ambulatory Visit: Payer: Self-pay

## 2019-09-05 ENCOUNTER — Telehealth (HOSPITAL_COMMUNITY): Payer: Self-pay

## 2019-09-05 ENCOUNTER — Inpatient Hospital Stay: Payer: Medicare Other

## 2019-09-05 ENCOUNTER — Inpatient Hospital Stay: Payer: Medicare Other | Attending: Hematology | Admitting: Hematology

## 2019-09-05 VITALS — BP 134/78 | HR 81 | Temp 99.1°F | Resp 18 | Ht 64.0 in | Wt 117.3 lb

## 2019-09-05 DIAGNOSIS — I2699 Other pulmonary embolism without acute cor pulmonale: Secondary | ICD-10-CM | POA: Insufficient documentation

## 2019-09-05 DIAGNOSIS — R21 Rash and other nonspecific skin eruption: Secondary | ICD-10-CM | POA: Diagnosis not present

## 2019-09-05 DIAGNOSIS — Z452 Encounter for adjustment and management of vascular access device: Secondary | ICD-10-CM | POA: Insufficient documentation

## 2019-09-05 DIAGNOSIS — Z95828 Presence of other vascular implants and grafts: Secondary | ICD-10-CM

## 2019-09-05 DIAGNOSIS — N6452 Nipple discharge: Secondary | ICD-10-CM | POA: Diagnosis not present

## 2019-09-05 DIAGNOSIS — C8440 Peripheral T-cell lymphoma, not classified, unspecified site: Secondary | ICD-10-CM

## 2019-09-05 DIAGNOSIS — Z853 Personal history of malignant neoplasm of breast: Secondary | ICD-10-CM | POA: Diagnosis not present

## 2019-09-05 DIAGNOSIS — L988 Other specified disorders of the skin and subcutaneous tissue: Secondary | ICD-10-CM

## 2019-09-05 DIAGNOSIS — C865 Angioimmunoblastic T-cell lymphoma: Secondary | ICD-10-CM | POA: Diagnosis not present

## 2019-09-05 DIAGNOSIS — N649 Disorder of breast, unspecified: Secondary | ICD-10-CM | POA: Diagnosis not present

## 2019-09-05 LAB — CBC WITH DIFFERENTIAL (CANCER CENTER ONLY)
Abs Immature Granulocytes: 0.01 10*3/uL (ref 0.00–0.07)
Basophils Absolute: 0 10*3/uL (ref 0.0–0.1)
Basophils Relative: 1 %
Eosinophils Absolute: 0.1 10*3/uL (ref 0.0–0.5)
Eosinophils Relative: 3 %
HCT: 41.5 % (ref 36.0–46.0)
Hemoglobin: 13.5 g/dL (ref 12.0–15.0)
Immature Granulocytes: 0 %
Lymphocytes Relative: 16 %
Lymphs Abs: 0.8 10*3/uL (ref 0.7–4.0)
MCH: 29.5 pg (ref 26.0–34.0)
MCHC: 32.5 g/dL (ref 30.0–36.0)
MCV: 90.6 fL (ref 80.0–100.0)
Monocytes Absolute: 0.6 10*3/uL (ref 0.1–1.0)
Monocytes Relative: 13 %
Neutro Abs: 3.2 10*3/uL (ref 1.7–7.7)
Neutrophils Relative %: 67 %
Platelet Count: 184 10*3/uL (ref 150–400)
RBC: 4.58 MIL/uL (ref 3.87–5.11)
RDW: 13.5 % (ref 11.5–15.5)
WBC Count: 4.8 10*3/uL (ref 4.0–10.5)
nRBC: 0 % (ref 0.0–0.2)

## 2019-09-05 LAB — CMP (CANCER CENTER ONLY)
ALT: 11 U/L (ref 0–44)
AST: 13 U/L — ABNORMAL LOW (ref 15–41)
Albumin: 3.6 g/dL (ref 3.5–5.0)
Alkaline Phosphatase: 89 U/L (ref 38–126)
Anion gap: 10 (ref 5–15)
BUN: 22 mg/dL (ref 8–23)
CO2: 25 mmol/L (ref 22–32)
Calcium: 9.4 mg/dL (ref 8.9–10.3)
Chloride: 107 mmol/L (ref 98–111)
Creatinine: 0.72 mg/dL (ref 0.44–1.00)
GFR, Est AFR Am: >60 mL/min (ref >60)
GFR, Estimated: >60 mL/min (ref >60)
Glucose, Bld: 118 mg/dL — ABNORMAL HIGH (ref 70–99)
Potassium: 4.2 mmol/L (ref 3.5–5.1)
Sodium: 142 mmol/L (ref 135–145)
Total Bilirubin: 0.5 mg/dL (ref 0.3–1.2)
Total Protein: 6.8 g/dL (ref 6.5–8.1)

## 2019-09-05 LAB — LACTATE DEHYDROGENASE: LDH: 215 U/L — ABNORMAL HIGH (ref 98–192)

## 2019-09-05 MED ORDER — SODIUM CHLORIDE 0.9% FLUSH
10.0000 mL | Freq: Once | INTRAVENOUS | Status: AC
  Administered 2019-09-05: 10 mL
  Filled 2019-09-05: qty 10

## 2019-09-05 MED ORDER — HEPARIN SOD (PORK) LOCK FLUSH 100 UNIT/ML IV SOLN
500.0000 [IU] | Freq: Once | INTRAVENOUS | Status: AC
  Administered 2019-09-05: 12:00:00 500 [IU]
  Filled 2019-09-05: qty 5

## 2019-09-05 MED ORDER — PREDNISONE 20 MG PO TABS: 20.0000 mg | ORAL_TABLET | Freq: Every day | ORAL | 0 refills | Status: DC

## 2019-09-05 NOTE — Telephone Encounter (Signed)

## 2019-09-08 ENCOUNTER — Ambulatory Visit (INDEPENDENT_AMBULATORY_CARE_PROVIDER_SITE_OTHER): Payer: Medicare Other | Admitting: Surgery

## 2019-09-08 ENCOUNTER — Encounter: Payer: Self-pay | Admitting: Surgery

## 2019-09-08 ENCOUNTER — Other Ambulatory Visit: Payer: Self-pay

## 2019-09-08 ENCOUNTER — Encounter (HOSPITAL_BASED_OUTPATIENT_CLINIC_OR_DEPARTMENT_OTHER): Payer: Medicare Other | Admitting: Internal Medicine

## 2019-09-08 ENCOUNTER — Ambulatory Visit (HOSPITAL_COMMUNITY)
Admission: RE | Admit: 2019-09-08 | Discharge: 2019-09-08 | Disposition: A | Discharge status: Home / Self Care | Payer: Medicare Other | Source: Ambulatory Visit | Attending: Surgery | Admitting: Surgery

## 2019-09-08 VITALS — BP 112/68 | HR 83 | Temp 97.8°F | Resp 18 | Ht 64.0 in | Wt 115.0 lb

## 2019-09-08 DIAGNOSIS — I872 Venous insufficiency (chronic) (peripheral): Secondary | ICD-10-CM | POA: Diagnosis not present

## 2019-09-08 DIAGNOSIS — M7989 Other specified soft tissue disorders: Secondary | ICD-10-CM | POA: Diagnosis not present

## 2019-09-08 NOTE — Progress Notes (Signed)
[facility] [place]  Patient name: Sarah [name] MRN: [id number] DOB: May 14, 1933 Sex: female   REQUESTING PROVIDER:    Dr. Felipa Eth   REASON FOR CONSULT:    Leg ulcer  HISTORY OF PRESENT ILLNESS:   Sarah [name] is a 86 y.o. female, who is referred for evaluation of a left leg venous ulcer.  The patient states that this was present for approximately 3 months and with elevation and compression wraps it has finally healed.  She feels like she is starting to get another 1.  She states that she has had chronic bilateral lower extremity edema left greater than right for many many years.  She does have a history of bilateral knee surgery.  She has been wearing 15-20 compressions therapy.  Patient has a history of DVT in the left leg.  This involved the posterior tibial and gastrocnemius veins.  He is on anticoagulation.  He is medically managed.  She is a non-smoker  PAST MEDICAL HISTORY    Past Medical History:  Diagnosis Date  . Allergy    codeine, thiazides  . Anxiety    new dx  . Arthritis   . 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 Cancer ([place])    breast  . Cataract   . DVT (deep venous thrombosis) ([place]) 02/2019   left leg  . Dyspnea   . Edema    both legs feet and toe, abdomen  . Glaucoma    laser treated years ago  . HOH (hard of hearing)   . Hyperlipemia   . Hypertension   . Hypothyroidism   . Pancreatic cyst    benign  . PONV (postoperative nausea and vomiting)   . Pulmonary embolism ([place]) 02/2019   bilateral   . Radiation 06/11/2012-07/12/2012   17 sessions 4250 cGy, 3 sessions 750 cGy  . Vertigo   . Wears glasses   . Wears partial dentures    partial upper     FAMILY HISTORY   Family History  Problem Relation Age of Onset  . Heart disease Father   . Cancer Maternal Aunt        stomach    SOCIAL HISTORY:    Social History   Socioeconomic History  . Marital status: Widowed    Spouse name: Not on file  . Number of children: 2  . Years of education: Not on file  . Highest education level: Not on file  Occupational History  . Occupation: Retired    Comment: Community education officer   Tobacco Use  . Smoking status: Never Smoker  . Smokeless tobacco: Never Used  Substance and Sexual Activity  . Alcohol use: No  . Drug use: No  . Sexual activity: Not Currently  Other Topics Concern  . Not on file  Social History Narrative  . Not on file   Social Determinants of Health   Financial Resource Strain:   . Difficulty of Paying Living Expenses:   Food Insecurity:   . Worried About Charity fundraiser in the Last Year:   . Arboriculturist in the Last Year:   Transportation Needs:   . Film/video editor (Medical):   Marland Kitchen Lack of Transportation (Non-Medical):   Physical Activity:   . Days of Exercise per Week:   . Minutes of Exercise per Session:   Stress:   . Feeling of Stress :   Social Connections:   .  Frequency of Communication with Friends and Family:   . Frequency of Social Gatherings with Friends and Family:   . Attends Religious Services:   . Active Member of Clubs or Organizations:   . Attends Archivist Meetings:   Marland Kitchen Marital Status:   Intimate Partner Violence:   . Fear of Current or Ex-Partner:   . Emotionally Abused:   Marland Kitchen Physically Abused:   . Sexually Abused:     ALLERGIES:    Allergies  Allergen Reactions  . Codeine Other (See Comments)    Reaction not recalled  . Latex   . Thiazide-Type Diuretics Other (See Comments)    Blurry vision?? (patient stated she has macular degeneration)    CURRENT MEDICATIONS:    Current Outpatient Medications  Medication Sig Dispense Refill  . amLODipine (NORVASC) 2.5 MG tablet Take 2.5-5 mg by mouth See admin instructions. 5 mg in the morning, 2.5 mg in the evening    . apixaban (ELIQUIS) 5 MG TABS tablet Take 1  tablet (5 mg total) by mouth 2 (two) times daily. 180 tablet 0  . Cholecalciferol (VITAMIN D) 50 MCG (2000 UT) tablet Take 2,000 Units by mouth 2 (two) times daily.    . Cyanocobalamin (VITAMIN B-12 PO) Take 3,000 mcg by mouth daily.     Marland Kitchen levothyroxine (SYNTHROID) 25 MCG tablet Take 25 mcg by mouth daily before breakfast.     . lidocaine-prilocaine (EMLA) cream Apply to affected area once 30 g 3  . LORazepam (ATIVAN) 0.5 MG tablet Take 0.5-1 tablets (0.25-0.5 mg total) by mouth every 8 (eight) hours. 30 tablet 0  . Multiple Vitamins-Minerals (PRESERVISION AREDS PO) Take 1 capsule by mouth 2 (two) times daily.     . ondansetron (ZOFRAN) 4 MG tablet Take 4 mg by mouth 2 (two) times daily as needed.    . ondansetron (ZOFRAN) 8 MG tablet Take 1 tablet (8 mg total) by mouth 2 (two) times daily as needed. Start on the third day after chemotherapy. 30 tablet 1  . polyethylene glycol (MIRALAX / GLYCOLAX) 17 g packet Take 17 g by mouth daily.    . predniSONE (DELTASONE) 20 MG tablet Take 1 tablet (20 mg total) by mouth daily with breakfast. 10 tablet 0  . prochlorperazine (COMPAZINE) 10 MG tablet Take 1 tablet (10 mg total) by mouth every 6 (six) hours as needed (Nausea or vomiting). 30 tablet 1  . SANTYL ointment     . zolpidem (AMBIEN) 10 MG tablet Take 2.5 mg by mouth at bedtime.     . gabapentin (NEURONTIN) 100 MG capsule Take 2 capsules (200 mg total) by mouth at bedtime. (Patient not taking: Reported on 07/16/2019) 60 capsule 2   No current facility-administered medications for this visit.    REVIEW OF SYSTEMS:   [X]  denotes positive finding, [ ]  denotes negative finding Cardiac  Comments:  Chest pain or chest pressure:    Shortness of breath upon exertion:    Short of breath when lying flat:    Irregular heart rhythm:        Vascular    Pain in calf, thigh, or hip brought on by ambulation:    Pain in feet at night that wakes you up from your sleep:     Blood clot in your veins:    Leg  swelling:  x       Pulmonary    Oxygen at home:    Productive cough:     Wheezing:  Neurologic    Sudden weakness in arms or legs:     Sudden numbness in arms or legs:     Sudden onset of difficulty speaking or slurred speech:    Temporary loss of vision in one eye:     Problems with dizziness:         Gastrointestinal    Blood in stool:      Vomited blood:         Genitourinary    Burning when urinating:     Blood in urine:        Psychiatric    Major depression:         Hematologic    Bleeding problems:    Problems with blood clotting too easily:        Skin    Rashes or ulcers: x       Constitutional    Fever or chills:     PHYSICAL EXAM:   Vitals:   09/08/19 1432  BP: 112/68  Pulse: 83  Resp: 18  Temp: 97.8 F (36.6 C)  SpO2: 95%  Weight: 115 lb (52.2 kg)  Height: 5\' 4"  (1.626 m)    GENERAL: The patient is a well-nourished female, in no acute distress. The vital signs are documented above. CARDIAC: There is a regular rate and rhythm.  VASCULAR: Palpable dorsalis pedis pulse bilaterally.  2+ pitting edema bilaterally PULMONARY: Nonlabored respirations ABDOMEN: Soft and non-tender with normal pitched bowel sounds.  MUSCULOSKELETAL: There are no major deformities or cyanosis. NEUROLOGIC: No focal weakness or paresthesias are detected. SKIN: Healed left leg ulcer PSYCHIATRIC: The patient has a normal affect.  STUDIES:   I have reviewed the following: Right:  - No evidence of deep vein thrombosis seen in the right lower extremity,  from the common femoral through the popliteal veins.  - No evidence of superficial venous thrombosis in the right lower  extremity.  - No evidence of superficial venous reflux seen in the right short  saphenous vein where visualized.  - Venous reflux is noted in the right sapheno-femoral junction.  - Venous reflux is noted in the right greater saphenous vein in the knee.   ASSESSMENT and PLAN   Chronic venous  insufficiency CEAP class V: The patient had minimal reflux in her left saphenous vein and no evidence of reflux in the deep venous system.  I think her best option now is elevation and compression.  I think she would benefit from 20-30 compression instead of what she is wearing now (15-20).  I also think that she needs to get them sized more appropriately.  Even with her wearing the stockings she has significant pitting edema.  I have encouraged her to go get this taken care of which she will.  If she develops a wound, she will contact me or the wound center.   Leia Alf, MD, FACS [facility] [phone] Pager [phone]

## 2019-09-09 LAB — MULTIPLE MYELOMA PANEL, SERUM
Albumin SerPl Elph-Mcnc: 3.5 g/dL (ref 2.9–4.4)
Albumin/Glob SerPl: 1.3 (ref 0.7–1.7)
Alpha 1: 0.3 g/dL (ref 0.0–0.4)
Alpha2 Glob SerPl Elph-Mcnc: 0.7 g/dL (ref 0.4–1.0)
B-Globulin SerPl Elph-Mcnc: 1 g/dL (ref 0.7–1.3)
Gamma Glob SerPl Elph-Mcnc: 0.7 g/dL (ref 0.4–1.8)
Globulin, Total: 2.7 g/dL (ref 2.2–3.9)
IgA: 255 mg/dL (ref 64–422)
IgG (Immunoglobin G), Serum: 600 mg/dL (ref 586–1602)
IgM (Immunoglobulin M), Srm: 263 mg/dL — ABNORMAL HIGH (ref 26–217)
Total Protein ELP: 6.2 g/dL (ref 6.0–8.5)

## 2019-09-10 ENCOUNTER — Telehealth: Payer: Self-pay | Admitting: Hematology

## 2019-09-10 NOTE — Telephone Encounter (Signed)
Called patient regarding upcoming appointments.

## 2019-09-11 ENCOUNTER — Telehealth: Payer: Self-pay | Admitting: *Deleted

## 2019-09-11 ENCOUNTER — Encounter: Payer: Self-pay | Admitting: Hematology

## 2019-09-11 DIAGNOSIS — R928 Other abnormal and inconclusive findings on diagnostic imaging of breast: Secondary | ICD-10-CM | POA: Diagnosis not present

## 2019-09-11 DIAGNOSIS — N6452 Nipple discharge: Secondary | ICD-10-CM | POA: Diagnosis not present

## 2019-09-11 NOTE — Telephone Encounter (Signed)
Contacted [facility] at [phone] for fax number.Representative Vaughan Basta provided [phone] as fax number. Renato Gails that Dr.Kale's office needed to fax order for Diagnostic mammogram and ultrasound scheduled on 5/4. Vaughan Basta stated that patient had those procedures this morning (09/11/19) at [facility]. Patient had rescheduled for earlier appt and  provided a paper order to office. Vaughan Basta states to also fax signed order from Dr. Grier Mitts office.  Fax sent and confirmation received.

## 2019-09-12 ENCOUNTER — Telehealth: Payer: Self-pay | Admitting: *Deleted

## 2019-09-12 NOTE — Telephone Encounter (Signed)
Daughter called - Mother woke up with sore throat and ear ache. No fever. She wants to know what Dr. Irene Limbo recommends.  Contacted daughter per Dr. Irene Limbo: He recommends evaluation with PCP or Urgent Care - may need strep test. Mother may gargle with warm salt water and take tylenol for throat and ear discomfort. Daughter verbalized understanding and states she will encourage mother to go to [facility] tomorrow for evaluation.

## 2019-09-16 ENCOUNTER — Other Ambulatory Visit: Payer: Self-pay

## 2019-09-16 ENCOUNTER — Ambulatory Visit (HOSPITAL_COMMUNITY)
Admission: RE | Admit: 2019-09-16 | Discharge: 2019-09-16 | Disposition: A | Discharge status: Home / Self Care | Payer: Medicare Other | Source: Ambulatory Visit | Attending: Hematology | Admitting: Hematology

## 2019-09-16 DIAGNOSIS — Z5111 Encounter for antineoplastic chemotherapy: Secondary | ICD-10-CM

## 2019-09-16 DIAGNOSIS — C865 Angioimmunoblastic T-cell lymphoma: Secondary | ICD-10-CM | POA: Diagnosis not present

## 2019-09-16 DIAGNOSIS — C8440 Peripheral T-cell lymphoma, not classified, unspecified site: Secondary | ICD-10-CM | POA: Diagnosis not present

## 2019-09-16 DIAGNOSIS — Z79899 Other long term (current) drug therapy: Secondary | ICD-10-CM | POA: Diagnosis not present

## 2019-09-16 LAB — GLUCOSE, CAPILLARY: Glucose-Capillary: 76 mg/dL (ref 70–99)

## 2019-09-16 MED ORDER — FLUDEOXYGLUCOSE F - 18 (FDG) INJECTION
5.7500 | Freq: Once | INTRAVENOUS | Status: AC | PRN
  Administered 2019-09-16: 5.75 via INTRAVENOUS

## 2019-09-17 ENCOUNTER — Telehealth: Payer: Self-pay | Admitting: *Deleted

## 2019-09-17 ENCOUNTER — Other Ambulatory Visit: Payer: Medicare Other

## 2019-09-17 ENCOUNTER — Ambulatory Visit: Payer: Medicare Other | Admitting: Hematology

## 2019-09-17 NOTE — Telephone Encounter (Signed)
Orders for breast biopsy signed by Dr.Kale and faxed to [facility]. Fax confirmation received

## 2019-09-21 DIAGNOSIS — J22 Unspecified acute lower respiratory infection: Secondary | ICD-10-CM | POA: Diagnosis not present

## 2019-09-22 ENCOUNTER — Telehealth: Payer: Self-pay

## 2019-09-22 ENCOUNTER — Other Ambulatory Visit: Payer: Self-pay | Admitting: Hematology

## 2019-09-22 ENCOUNTER — Ambulatory Visit: Payer: Medicare Other | Admitting: Hematology

## 2019-09-22 MED ORDER — PREDNISONE 20 MG PO TABS: 40.0000 mg | ORAL_TABLET | Freq: Every day | ORAL | 0 refills | Status: AC

## 2019-09-22 NOTE — Telephone Encounter (Signed)
Received call from pt.'s daughter Sarah [name] reporting recurring generalized body rash that has spread to pt.'s face, with severe itching, increased fatigue and distended abdomen. She reported that they visited the urgent care yesterday for fear she might have a sinus infection because of her sore throat but that was ruled out. Pt.'s MD visit is on 09/24/19. [name] denied any other complains such as fever, pain, N/V. She requested Prednisone refill to help with the itching, and any other advice if necessary from MD before appt. on Wednesday.   Dr. Irene Limbo sent Prednisone prescription to her pharmacy and requested he would like to see pt. sooner - possibly phone visit or inpatient visit today at 12:30 if she can/wants to.  Advised [name] to pick up medication from pharmacy and MD's request to see pt. sooner. She insisted this was ongoing the last time pt. saw him; the only difference is the rash has spread to her face and the distended abdomen. I explained to her this must be why he would like to see pt. sooner, but she stated pt. has no cell phone and she lives in [place] and can't make 12:30 appt. today unless its later like 4:30 today or tomorrow if not, she is okay with keeping the Wednesday appt.   MD is okay with seeing pt. on Wednesday as scheduled.

## 2019-09-24 ENCOUNTER — Inpatient Hospital Stay: Payer: Medicare Other

## 2019-09-24 ENCOUNTER — Other Ambulatory Visit: Payer: Self-pay

## 2019-09-24 ENCOUNTER — Inpatient Hospital Stay (HOSPITAL_BASED_OUTPATIENT_CLINIC_OR_DEPARTMENT_OTHER): Payer: Medicare Other | Admitting: Hematology

## 2019-09-24 ENCOUNTER — Inpatient Hospital Stay: Payer: Medicare Other | Attending: Hematology

## 2019-09-24 VITALS — BP 137/77 | HR 87 | Temp 98.3°F | Resp 18 | Ht 64.0 in | Wt 116.4 lb

## 2019-09-24 DIAGNOSIS — C865 Angioimmunoblastic T-cell lymphoma: Secondary | ICD-10-CM | POA: Diagnosis not present

## 2019-09-24 DIAGNOSIS — Z5112 Encounter for antineoplastic immunotherapy: Secondary | ICD-10-CM | POA: Diagnosis not present

## 2019-09-24 DIAGNOSIS — C8440 Peripheral T-cell lymphoma, not classified, unspecified site: Secondary | ICD-10-CM | POA: Diagnosis not present

## 2019-09-24 DIAGNOSIS — Z853 Personal history of malignant neoplasm of breast: Secondary | ICD-10-CM | POA: Insufficient documentation

## 2019-09-24 DIAGNOSIS — Z5111 Encounter for antineoplastic chemotherapy: Secondary | ICD-10-CM

## 2019-09-24 DIAGNOSIS — Z452 Encounter for adjustment and management of vascular access device: Secondary | ICD-10-CM | POA: Insufficient documentation

## 2019-09-24 DIAGNOSIS — Z95828 Presence of other vascular implants and grafts: Secondary | ICD-10-CM

## 2019-09-24 DIAGNOSIS — I2699 Other pulmonary embolism without acute cor pulmonale: Secondary | ICD-10-CM | POA: Insufficient documentation

## 2019-09-24 LAB — CMP (CANCER CENTER ONLY)
ALT: 11 U/L (ref 0–44)
AST: 12 U/L — ABNORMAL LOW (ref 15–41)
Albumin: 3.6 g/dL (ref 3.5–5.0)
Alkaline Phosphatase: 67 U/L (ref 38–126)
Anion gap: 10 (ref 5–15)
BUN: 21 mg/dL (ref 8–23)
CO2: 27 mmol/L (ref 22–32)
Calcium: 9.7 mg/dL (ref 8.9–10.3)
Chloride: 106 mmol/L (ref 98–111)
Creatinine: 0.76 mg/dL (ref 0.44–1.00)
GFR, Est AFR Am: >60 mL/min (ref >60)
GFR, Estimated: >60 mL/min (ref >60)
Glucose, Bld: 157 mg/dL — ABNORMAL HIGH (ref 70–99)
Potassium: 4.1 mmol/L (ref 3.5–5.1)
Sodium: 143 mmol/L (ref 135–145)
Total Bilirubin: 0.5 mg/dL (ref 0.3–1.2)
Total Protein: 7 g/dL (ref 6.5–8.1)

## 2019-09-24 LAB — CBC WITH DIFFERENTIAL/PLATELET
Abs Immature Granulocytes: 0.03 10*3/uL (ref 0.00–0.07)
Basophils Absolute: 0 10*3/uL (ref 0.0–0.1)
Basophils Relative: 1 %
Eosinophils Absolute: 0.1 10*3/uL (ref 0.0–0.5)
Eosinophils Relative: 1 %
HCT: 40.9 % (ref 36.0–46.0)
Hemoglobin: 13 g/dL (ref 12.0–15.0)
Immature Granulocytes: 0 %
Lymphocytes Relative: 5 %
Lymphs Abs: 0.5 10*3/uL — ABNORMAL LOW (ref 0.7–4.0)
MCH: 28.8 pg (ref 26.0–34.0)
MCHC: 31.8 g/dL (ref 30.0–36.0)
MCV: 90.5 fL (ref 80.0–100.0)
Monocytes Absolute: 0.2 10*3/uL (ref 0.1–1.0)
Monocytes Relative: 2 %
Neutro Abs: 7.7 10*3/uL (ref 1.7–7.7)
Neutrophils Relative %: 91 %
Platelets: 203 10*3/uL (ref 150–400)
RBC: 4.52 MIL/uL (ref 3.87–5.11)
RDW: 13.5 % (ref 11.5–15.5)
WBC: 8.5 10*3/uL (ref 4.0–10.5)
nRBC: 0 % (ref 0.0–0.2)

## 2019-09-24 LAB — LACTATE DEHYDROGENASE: LDH: 191 U/L (ref 98–192)

## 2019-09-24 MED ORDER — SODIUM CHLORIDE 0.9% FLUSH
10.0000 mL | Freq: Once | INTRAVENOUS | Status: AC
  Administered 2019-09-24: 10 mL
  Filled 2019-09-24: qty 10

## 2019-09-24 NOTE — Progress Notes (Signed)
HEMATOLOGY/ONCOLOGY CLINIC NOTE  Date of Service: 09/24/2019  Patient Care Team: Lajean Manes, MD as PCP - General (Internal Medicine) Buford Dresser, MD as PCP - Cardiology (Cardiology)  REFERRING PHYSICIAN: Lajean Manes, MD  CHIEF COMPLAINTS/PURPOSE OF CONSULTATION:  Continue mx of Recently Diagnosed Angioimmunoblastic T cell lymphoma      HISTORY OF PRESENTING ILLNESS:   Sarah [name] is a wonderful 86 y.o. female who has been referred to [place] by Lajean Manes, MD for evaluation and management of Possible lymphoma . She is accompanied today by [name] . The pt reports that she is doing well overall.   Pending Surgical pathology from 03/17/2019 Dr.Manny will contact clinic about pathology   August 25th/27th she began coughing and sneezing. On Sept 3 went to doctors and they said it was allergies. She began having rashes (little red dots). Went to PCP and they prescribed prednisone and for several days and the medication helped. But as soon as she completed the prescription symptoms reoccurred.   She had a chest X Ray done because she was having a severe cough on 02/17/2019 revealing "bilateral effusions in this patient with history of breast cancer,of uncertain significance with question of right lower lobe pulmonary nodule, consider chest CT for further assessment. Atherosclerotic changes in the thoracic aorta."  Dr. Amy Martinique biopsied the rashes on her back 02/21/2019 and it was determined that it could be because of an allergy to medication. Dr.Stoneking stopped her blood pressure medication from September- October but ther rash still present. The itching started 1 to 2 months ago. The rash would come and go and was mostly on her back. She still has rash on her left thigh.  She is using triaconazole and cetrizine.  She has lumps behind ear and abdomin and she states that they have swollen twice the size within 1 and half months  She has no appetite and  has changes in her taste that began around August. She has changes in bowels. They are more loose and smaller In size. She also has no control over bowels.  She feels fatigued and it has increased since August.   She has a cough that starts in September  that has made her horse.   She had a CT scan on 02/27/2019 because she was having severe cough and her stomach was very extended. Revealing "1. Extensive lymphadenopathy throughout the chest, abdomen, and pelvis, with index lymph nodes identified above. Splenomegaly. Findings are most consistent with lymphoma with recurrent, metastatic breast malignancy less favored. 2. There is generally osteopenic appearance of the skeleton. There are numerous subtle hypodense lesions, particularly of the pelvis (e.g. Series 2, image 88) and select vertebral bodies, for example L4 (series 8, image 92). This is suspicious although not definitive for osseous metastatic disease. Characterization for metabolic activity by nuclear scintigraphic bone scan or PET-CT would be helpful to further evaluate. 3. Moderate right, small left pleural effusions with associated atelectasis or consolidation. Subpleural radiation fibrosis of the anterior right lung. 4. There is a new subpleural pulmonary nodule of the anterior right middle lobe measuring 6 mm (series 4, image 105), nonspecific. There is no obvious pleural thickening or nodularity definitive for metastatic disease. 5.  Small volume ascites. 6. There is a fluid attenuation lesion of the pancreatic uncinate measuring 2.4 x 1.3 cm (series 2, image 63), not substantially changed when compared to remote prior MR examination dated 02/23/2012 and likely sequelae of a prior pseudocyst versus incidental pancreatic IPMN. 7.  Cardiomegaly."  Hospitalized from 03/06/2019-03/08/2019. She presenting with acute shortness of breath, leg swelling,left hand swelling and some weight gain despite poor appetite. In ED, CTA chest revealed at least  submassive PE, moderate right pleural effusion, diffuse LAD concerning for lymphoma. Started on heparin drip. PCCM consulted for guidance. She had thoracocentesis with removal of 1200 cc cloudy exudative pleural fluid. Cardiology consulted for elevated which was thought to be demand ischemia from right heart strain in the setting of PE. Patient was transitioned to subcu Lovenox by pulmonology the next day and remained stable. Unusual appearance of tracheal air column at the thoracic inlet. Correlate with any history of swallowing difficulty or changes in voice with dedicated imaging with chest CT and or CT of the neck as indicated.  She has a nonhealing ulcer on her right tibialis anterior that starting weeping one week ago. Of note since the patient's last visit, pt has had CHEST XRAY - 2 VIEW (Accession [id number]) completed on 02/17/2019 with results revealing "Bilateral effusions in this patient with history of breast cancer,of uncertain significance with question of right lower lobepulmonary nodule, consider chest CT for furtherassessment.Unusual appearance of tracheal air column at the thoracic inlet.Correlate with any history of swallowing difficulty or changes in voice with dedicated imaging with chest CT and or CT of the neck as Indicated. Atherosclerotic changes in the thoracic aorta."  Of note since the patient's last visit, pt has had CT CHEST, ABDOMEN, AND PELVIS WITH CONTRAST (Accession [id number]) completed on 02/27/2019 with results revealing "1. Extensive lymphadenopathy throughout the chest, abdomen, and pelvis, with index lymph nodes identified above. Splenomegaly.Findings are most consistent with lymphoma with recurrent, metastatic breast malignancy less favored. 2. There is generally osteopenic appearance of the skeleton. There are numerous subtle hypodense lesions, particularly of the pelvis (e.g. Series 2, image 88) and select vertebral bodies, for example L4 (series 8, image 92).  This is suspicious although not definitive for osseous metastatic disease. Characterization for metabolic activity by nuclear scintigraphic bone scan or PET-CT would be helpful to further evaluate.  3. Moderate right, small left pleural effusions with associated atelectasis or consolidation. Subpleural radiation fibrosis of the anterior right lung.4. There is a new subpleural pulmonary nodule of the anterior right middle lobe measuring 6 mm (series 4, image 105), nonspecific. There is no obvious pleural thickening or nodularity definitive for metastatic disease.5.  Small volume ascites. 6. There is a fluid attenuation lesion of the pancreatic uncinate measuring 2.4 x 1.3 cm (series 2, image 63), not substantially changed when compared to remote prior MR examination dated 02/23/2012 and likely sequelae of a prior pseudocyst versus incidental pancreatic IPMN.7.  Cardiomegaly.8. Other chronic and incidental findings as detailed above. Aortic Atherosclerosis (ICD10-I70.0)."  Of note since the patient's last visit, pt has had PORTABLE CHEST 1 VIEW (Accession [id number]) completed on 03/06/2019 with results revealing "1. Moderate size right and small left pleural effusions appear not significantly changed since 02/27/19. 2. No new cardiopulmonary abnormality."  Of note since the patient's last visit, pt has had ECHOCARDIOGRAM  completed on 03/06/2019 with results revealing  "1. Left ventricular ejection fraction, by visual estimation, is 60 to 65%. The left ventricle has normal function. Normal left ventricular size. There is no left ventricular hypertrophy. 2. Left ventricular diastolic Doppler parameters are consistent with impaired relaxation pattern of LV diastolic filling. 3. Global right ventricle has mildly reduced systolic function.The right ventricular size is moderately enlarged. No increase in right ventricular wall thickness. 4. Left atrial size was normal.  5. Right atrial size was mildly dilated.  6. Trivial pericardial effusion is present. 7. The mitral valve is normal in structure. Trace mitral valve regurgitation. No evidence of mitral stenosis. 8. The tricuspid valve is normal in structure. Tricuspid valve regurgitation severe. 9. The aortic valve is normal in structure. Aortic valve regurgitation was not visualized by color flow Doppler. Structurally normal aortic valve, with no evidence of sclerosis or stenosis.10. The pulmonic valve was normal in structure. Pulmonic valve regurgitation is mild by color flow Doppler.11. Mildly elevated pulmonary artery systolic pressure.12. The inferior vena cava is normal in size with greater than 50% respiratory variability, suggesting right atrial pressure of 3 mmHg."  Of note since the patient's last visit, pt has had CT ANGIOGRAPHY CHEST WITH CONTRAST (Accession [id number]) completed on 03/06/2019 with results revealing "1. Pulmonary embolus arising from the distal left main pulmonary artery with extension into multiple left lower lobe pulmonary arterial branches. There is also incompletely obstructing pulmonary embolus in the proximal left upper lobe pulmonary artery. More distal pulmonary embolus in the right lower lobe pulmonary artery. Positive for acute PE with CT evidence of right heart strain (RV/LV Ratio = 1.6) consistent with at least submassive (intermediate risk) PE. The presence of right heart strain has been associated with an increased risk of morbidity and mortality. Please activate Code PE by paging [phone]. 2. Sizable pleural effusion on the right with smaller pleural effusion on the left. Consolidation and compressive atelectasis throughout most of the right lower lobe. Milder atelectasis left base. Nodular opacities on the right, likely metastatic foci.3.  Stable adenopathy compared to 1 week prior.4. Enlargement of spleen, incompletely visualized but documented CT 1 week prior. Concern for potential lymphoma. 5. Aortic atherosclerosis.  No aneurysm or dissection evident. Foci of coronary artery calcification noted."  Most recent lab results (03/06/2019) of CBC is as follows: all values are WNL except for Platelets at 72, Eosinophils Absolute at 0.6,Abs Immature Granulocytes at 0.08 .  On review of systems, pt reports rashes, digestive issues and denies back pain, abdominal pain and any other symptoms.   On PMHx the pt reports Hypothyroidism, MCI, Hypercholesterolemia, Tracheal anomaly, Hypertension, Pulmonary nodule, Atherosclerosis of aorta, anxiety, gait disorder, primary insomnia.   INTERVAL HISTORY:  Sarah [name] is a 86 y.o. female here for evaluation and management of Angioimmunoblastic T-cell lymphoma. We are joined today by her daughter, [name]. The patient's last visit with [place] was on 09/05/2019. The pt reports that she is doing well overall.  The pt reports that her rash was improved with steroids but came back six days later and spread to her face. Pt has been experiencing fatigue, although, she has been sleeping and eating well. She has noticed a change in the color and shape of her bowel movements. She has had mild abdominal bloating.   Of note since the patient's last visit, pt has had PET/CT ([id number]) completed on 09/16/2019 with results revealing "1. Unfortunately evidence of lymphoma recurrence at sites of previous hypermetabolic adenopathy. Hypermetabolic lymphoid tissue is new from comparison PET-CT 05/28/2019 but at same locations as PET-CT 04/08/2019. The number of metastatic lymph nodes is less than 04/08/2019. Lymphoid tissue at the RIGHT skull base is larger. 2. Three foci of hypermetabolic recurrence are present, lymphoid tissue at the RIGHT base of skull, hypermetabolic RIGHT hilar lymph node and hypermetabolic lymph node in the porta hepatis."  Lab results today (09/24/19) of CBC w/diff and CMP is as follows: all values are WNL except for Lymphs Abs  at 0.5K, Glucose at 157, AST at 12. 09/24/2019 LDH  at 191  On review of systems, pt reports rash, fatigue, abdominal bloating, cough, bowel movement changes and denies sleeplessness, low appetite, breast tenderness, nipple discharge and any other symptoms.   MEDICAL HISTORY:  Past Medical History:  Diagnosis Date  . Allergy    codeine, thiazides  . Anxiety    new dx  . Arthritis   . Breast cancer ([place]) 03/14/12   bx=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,ER/PR=+,upper inner quad  . Cancer ([place])    breast  . Cataract   . DVT (deep venous thrombosis) ([place]) 02/2019   left leg  . Dyspnea   . Edema    both legs feet and toe, abdomen  . Glaucoma    laser treated years ago  . HOH (hard of hearing)   . Hyperlipemia   . Hypertension   . Hypothyroidism   . Pancreatic cyst    benign  . PONV (postoperative nausea and vomiting)   . Pulmonary embolism ([place]) 02/2019   bilateral   . Radiation 06/11/2012-07/12/2012   17 sessions 4250 cGy, 3 sessions 750 cGy  . Vertigo   . Wears glasses   . Wears partial dentures    partial upper     SURGICAL HISTORY: Past Surgical History:  Procedure Laterality Date  . ABDOMINAL HYSTERECTOMY  1966   1/2 ovary left in   . BREAST SURGERY  1992   lumpectomy-lt  . CATARACT EXTRACTION     b/l  . COLONOSCOPY    . EXCISION MASS NECK Left 03/17/2019    EXCISION MASS NECK (Left Neck)  . EXCISION MASS NECK Left 03/17/2019   Procedure: EXCISION MASS NECK;  Surgeon: Helayne Seminole, MD;  Location: [facility];  Service: ENT;  Laterality: Left;  . EYE SURGERY Bilateral    bilateral cataract removal  . IR IMAGING GUIDED PORT INSERTION  04/23/2019  . JOINT REPLACEMENT  2013   rt total knee  . JOINT REPLACEMENT  1995   lt total knee  . PARTIAL MASTECTOMY WITH NEEDLE LOCALIZATION  04/09/2012   Procedure: PARTIAL MASTECTOMY WITH NEEDLE LOCALIZATION;  Surgeon: Adin Hector, MD;  Location: [facility];  Service: General;  Laterality: Right;  . SKIN BIOPSY Left 03/17/2019    LEFT THIGH  . SKIN BIOPSY Left 03/17/2019   Procedure: Skin Biopsy Left Thigh;  Surgeon: Helayne Seminole, MD;  Location: [facility];  Service: ENT;  Laterality: Left;  . TONSILLECTOMY    . TOTAL KNEE ARTHROPLASTY  08/16/2011   Procedure: TOTAL KNEE ARTHROPLASTY;  Surgeon: Ninetta Lights, MD;  Location: [facility];  Service: Orthopedics;  Laterality: Right;     SOCIAL HISTORY: Social History   Socioeconomic History  . Marital status: Widowed    Spouse name: Not on file  . Number of children: 2  . Years of education: Not on file  . Highest education level: Not on file  Occupational History  . Occupation: Retired    Comment: Community education officer   Tobacco Use  . Smoking status: Never Smoker  . Smokeless tobacco: Never Used  Substance and Sexual Activity  . Alcohol use: No  . Drug use: No  . Sexual activity: Not Currently  Other Topics Concern  . Not on file  Social History Narrative  . Not on file   Social Determinants of Health   Financial Resource Strain:   . Difficulty of Paying Living Expenses:   Food Insecurity:   .  Worried About Charity fundraiser in the Last Year:   . Arboriculturist in the Last Year:   Transportation Needs:   . Film/video editor (Medical):   Marland Kitchen Lack of Transportation (Non-Medical):   Physical Activity:   . Days of Exercise per Week:   . Minutes of Exercise per Session:   Stress:   . Feeling of Stress :   Social Connections:   . Frequency of Communication with Friends and Family:   . Frequency of Social Gatherings with Friends and Family:   . Attends Religious Services:   . Active Member of Clubs or Organizations:   . Attends Archivist Meetings:   Marland Kitchen Marital Status:   Intimate Partner Violence:   . Fear of Current or Ex-Partner:   . Emotionally Abused:   Marland Kitchen Physically Abused:   . Sexually Abused:      FAMILY HISTORY: Family History  Problem Relation Age of Onset  . Heart disease Father   . Cancer Maternal Aunt        stomach      ALLERGIES:   is allergic to codeine; latex; and thiazide-type diuretics.   MEDICATIONS:  Current Outpatient Medications  Medication Sig Dispense Refill  . amLODipine (NORVASC) 2.5 MG tablet Take 2.5-5 mg by mouth See admin instructions. 5 mg in the morning, 2.5 mg in the evening    . apixaban (ELIQUIS) 5 MG TABS tablet Take 1 tablet (5 mg total) by mouth 2 (two) times daily. 180 tablet 0  . Cholecalciferol (VITAMIN D) 50 MCG (2000 UT) tablet Take 2,000 Units by mouth 2 (two) times daily.    . Cyanocobalamin (VITAMIN B-12 PO) Take 3,000 mcg by mouth daily.     Marland Kitchen levothyroxine (SYNTHROID) 25 MCG tablet Take 25 mcg by mouth daily before breakfast.     . lidocaine-prilocaine (EMLA) cream Apply to affected area once 30 g 3  . LORazepam (ATIVAN) 0.5 MG tablet Take 0.5-1 tablets (0.25-0.5 mg total) by mouth every 8 (eight) hours. 30 tablet 0  . Multiple Vitamins-Minerals (PRESERVISION AREDS PO) Take 1 capsule by mouth 2 (two) times daily.     . polyethylene glycol (MIRALAX / GLYCOLAX) 17 g packet Take 17 g by mouth daily.    . predniSONE (DELTASONE) 20 MG tablet Take 2 tablets (40 mg total) by mouth daily with breakfast for 10 days. 20 tablet 0  . zolpidem (AMBIEN) 10 MG tablet Take 2.5 mg by mouth at bedtime.     . gabapentin (NEURONTIN) 100 MG capsule Take 2 capsules (200 mg total) by mouth at bedtime. (Patient not taking: Reported on 07/16/2019) 60 capsule 2  . ondansetron (ZOFRAN) 4 MG tablet Take 4 mg by mouth 2 (two) times daily as needed.    . ondansetron (ZOFRAN) 8 MG tablet Take 1 tablet (8 mg total) by mouth 2 (two) times daily as needed. Start on the third day after chemotherapy. (Patient not taking: Reported on 09/24/2019) 30 tablet 1  . prochlorperazine (COMPAZINE) 10 MG tablet Take 1 tablet (10 mg total) by mouth every 6 (six) hours as needed (Nausea or vomiting). (Patient not taking: Reported on 09/24/2019) 30 tablet 1  . SANTYL ointment      No current facility-administered  medications for this visit.     REVIEW OF SYSTEMS:   A 10+ POINT REVIEW OF SYSTEMS WAS OBTAINED including neurology, dermatology, psychiatry, cardiac, respiratory, lymph, extremities, GI, GU, Musculoskeletal, constitutional, breasts, reproductive, HEENT.  All pertinent positives are noted  in the HPI.  All others are negative.   PHYSICAL EXAMINATION: ECOG FS:1 - Symptomatic but completely ambulatory  Vitals:   09/24/19 1412  BP: 137/77  Pulse: 87  Resp: 18  Temp: 98.3 F (36.8 C)  SpO2: 99%   Wt Readings from Last 3 Encounters:  09/24/19 116 lb 6.4 oz (52.8 kg)  09/08/19 115 lb (52.2 kg)  09/05/19 117 lb 4.8 oz (53.2 kg)   Body mass index is 19.98 kg/m.    GENERAL:alert, in no acute distress and comfortable SKIN: no acute rashes, no significant lesions EYES: conjunctiva are pink and non-injected, sclera anicteric OROPHARYNX: MMM, no exudates, no oropharyngeal erythema or ulceration NECK: supple, no JVD LYMPH:  no palpable lymphadenopathy in the cervical, axillary or inguinal regions LUNGS: clear to auscultation b/l with normal respiratory effort HEART: regular rate & rhythm ABDOMEN:  normoactive bowel sounds , non tender, not distended. No palpable hepatosplenomegaly.  Extremity: no pedal edema PSYCH: alert & oriented x 3 with fluent speech NEURO: no focal motor/sensory deficits  LABORATORY DATA:  I have reviewed the data as listed  CBC Latest Ref Rng & Units 09/24/2019 09/05/2019 08/05/2019  WBC 4.0 - 10.5 K/uL 8.5 4.8 5.0  Hemoglobin 12.0 - 15.0 g/dL 13.0 13.5 13.0  Hematocrit 36.0 - 46.0 % 40.9 41.5 41.2  Platelets 150 - 400 K/uL 203 184 160    CMP Latest Ref Rng & Units 09/24/2019 09/05/2019 08/05/2019  Glucose 70 - 99 mg/dL 157(H) 118(H) 111(H)  BUN 8 - 23 mg/dL 21 22 18   Creatinine 0.44 - 1.00 mg/dL 0.76 0.72 0.69  Sodium 135 - 145 mmol/L 143 142 144  Potassium 3.5 - 5.1 mmol/L 4.1 4.2 3.8  Chloride 98 - 111 mmol/L 106 107 108  CO2 22 - 32 mmol/L 27 25 25     Calcium 8.9 - 10.3 mg/dL 9.7 9.4 9.1  Total Protein 6.5 - 8.1 g/dL 7.0 6.8 6.4(L)  Total Bilirubin 0.3 - 1.2 mg/dL 0.5 0.5 0.5  Alkaline Phos 38 - 126 U/L 67 89 64  AST 15 - 41 U/L 12(L) 13(L) 13(L)  ALT 0 - 44 U/L 11 11 10    Component     Latest Ref Rng & Units 03/27/2019  Hepatitis B Surface Ag     NON REACTIVE NON REACTIVE  Hep B Core Total Ab     NON REACTIVE NON REACTIVE  HCV Ab     NON REACTIVE NON REACTIVE  LDH     98 - 192 U/L 252 (H)   . Lab Results  Component Value Date   LDH 191 09/24/2019      04/08/2019 NM PET Image Initial (PI) Skull Base To Thigh (Accession [id number])  04/04/2019 PATHOLOGY   04/07/2019 ECHOCARDIOGRAM  03/28/2019 PATHOLOGY   03/06/2019 CT ANGIOGRAPHY CHEST WITH CONTRAST (Accession [id number])  04/07/2019 ECHOCARDIOGRAM  03/06/2019 ECHOCARDIOGRAM     03/06/2019 PORTABLE CHEST 1 VIEW (Accession [id number])    02/27/2019 CT CHEST, ABDOMEN, AND PELVIS WITH CONTRAST (Accession [id number])    02/17/2019 CHEST XRAY - 2 VIEW (Accession [id number])    RADIOGRAPHIC STUDIES: I have personally reviewed the radiological images as listed and agreed with the findings in the report. NM PET Image Restag (PS) Skull Base To Thigh  Result Date: 09/16/2019 CLINICAL DATA:  Subsequent treatment strategy for Stage IV angioimmunoblastic T cell lymphoma. Status post induction chemotherapy. Immunotherapy 6 cycles. COVID vaccine LEFT arm. EXAM: NUCLEAR MEDICINE PET SKULL BASE TO THIGH TECHNIQUE: 5.75 mCi F-18 FDG was injected intravenously. Full-ring PET  imaging was performed from the skull base to thigh after the radiotracer. CT data was obtained and used for attenuation correction and anatomic localization. Fasting blood glucose: 76 mg/dl COMPARISON:  05/28/2019 on PET-CT FINDINGS: Mediastinal blood pool activity: SUV max 1.6 Liver activity: SUV max 2.5 NECK: Evidence of new hypermetabolic tissue at the base of the skull/prevertebral space in the region  of the RIGHT fossa of Rosenmuller. This is a broad focus of hypermetabolic tissue with SUV max equal 18.0. CT portion exam demonstrates masslike thickening measuring 3.3 by 1.8 cm (image 9/4). In comparison to more remote PET-CT scan 04/08/2019, there was hypermetabolic tissue at this site. No hypermetabolic cervical lymph nodes. Persistent diffuse hypermetabolic activity associated with the thyroid gland. Incidental CT findings: none CHEST: Single hypermetabolic RIGHT hilar lymph node with SUV max equal 5.7. This is new from most recent PET-CT scan 05/28/2019 but similar to PET-CT scan of 04/08/2019. Incidental CT findings: No hypermetabolic pulmonary nodules. Stable 5 mm subpleural nodule in the RIGHT lower lobe (image 71/4) ABDOMEN/PELVIS: Single hypermetabolic focus in the porta hepatis is concerning for lymph node recurrence. SUV max equal 5.7. This node is difficult define on the noncontrast CT. Again this node is new from most recent PET-CT scan but similar to PET-CT scan of 04/08/1999. Incidental CT findings: None SKELETON: No focal hypermetabolic activity to suggest skeletal metastasis. Incidental CT findings: none IMPRESSION: 1. Unfortunately evidence of lymphoma recurrence at sites of previous hypermetabolic adenopathy. Hypermetabolic lymphoid tissue is new from comparison PET-CT 05/28/2019 but at same locations as PET-CT 04/08/2019. The number of metastatic lymph nodes is less than 04/08/2019. Lymphoid tissue at the RIGHT skull base is larger. 2. Three foci of hypermetabolic recurrence are present, lymphoid tissue at the RIGHT base of skull, hypermetabolic RIGHT hilar lymph node and hypermetabolic lymph node in the porta hepatis. Electronically Signed   By: Suzy Bouchard M.D.   On: 09/16/2019 15:53   VAS [place] LOWER EXTREMITY VENOUS REFLUX  Result Date: 09/08/2019  Lower Venous Reflux Study Indications: Ulceration.  Performing Technologist: Caralee Ates BA, RVT, RDMS  Examination Guidelines: A  complete evaluation includes B-mode imaging, spectral Doppler, color Doppler, and power Doppler as needed of all accessible portions of each vessel. Bilateral testing is considered an integral part of a complete examination. Limited examinations for reoccurring indications may be performed as noted. The reflux portion of the exam is performed with the patient in reverse Trendelenburg. Significant venous reflux is defined as >500 ms in the superficial venous system, and >1 second in the deep venous system.  +--------------+---------+----------+-----------+------------+--------------+ LEFT          Reflux NoReflux YesReflux TimeDiameter cmsComments       +--------------+---------+----------+-----------+------------+--------------+ CFV           no                                                       +--------------+---------+----------+-----------+------------+--------------+ FV mid        no                                                       +--------------+---------+----------+-----------+------------+--------------+ Popliteal     no                                                       +--------------+---------+----------+-----------+------------+--------------+  GSV at [facility]                yes      >500 ms     0.334                   +--------------+---------+----------+-----------+------------+--------------+ GSV prox thighno                               0.362                   +--------------+---------+----------+-----------+------------+--------------+ GSV mid thigh no                               0.336                   +--------------+---------+----------+-----------+------------+--------------+ GSV dist thighno                               0.335                   +--------------+---------+----------+-----------+------------+--------------+ GSV at knee               yes      >500 ms     0.346                    +--------------+---------+----------+-----------+------------+--------------+ GSV prox calf no                               0.173                   +--------------+---------+----------+-----------+------------+--------------+ GSV mid calf  no                               0.250                   +--------------+---------+----------+-----------+------------+--------------+ SSV Pop Fossa no                               0.127                   +--------------+---------+----------+-----------+------------+--------------+ SSV prox calf                                           Not Visualized +--------------+---------+----------+-----------+------------+--------------+ SSV mid calf                                            Not Visualized +--------------+---------+----------+-----------+------------+--------------+  Summary: Left: - No evidence of deep vein thrombosis seen in the left lower extremity, from the common femoral through the popliteal veins. - No evidence of superficial venous thrombosis in the left lower extremity. - No evidence of superficial venous reflux seen in the left short saphenous vein where visualized. - Venous reflux is noted in the left sapheno-femoral junction. - Venous reflux is noted in the left greater saphenous vein in the knee.  *See table(s) above  for measurements and observations. Electronically signed by Harold Barban MD on 09/08/2019 at 3:34:04 PM.    Final      ASSESSMENT & PLAN:   AUTUMNE KALLIO is a 86 y.o. female with:  1. 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 SKIN, LEFT THIGH, BIOPSY: - Involvement by angioimmunoblastic T-cell lymphoma." -04/07/2019 Echocardiogram - normal EF -NM PET Image Initial (PI) Skull Base To Thigh (Accession [id number]) completed on 04/08/2019 with  results revealing "1. Widespread hypermetabolic adenopathy in the neck, chest, and abdomen/pelvis, primarily in the Deauville 4 and Deauville 5 range. There is also hypermetabolic activity along the posterior nasopharynx in lingual tonsillar regions potentially indicating sites of involvement. 2. The subtle hypodensities in the spine and bony pelvis are not appreciably hypermetabolic may be incidental, surveillance is suggested. 3. Bilateral thyroid activity but especially on the left. Probably from thyroiditis. 4. A 6 mm subpleural nodule anteriorly in the right lower lobe not appreciably hypermetabolic but is below sensitive PET-CT size thresholds and merits surveillance. 5. Mildly accentuated diffuse splenic activity without splenomegaly or focal splenic lesion identified. 6. Other imaging findings of potential clinical significance: Aortic Atherosclerosis (ICD10-I70.0). Coronary atherosclerosis. Large right and small left pleural effusions. Moderate cardiomegaly. Small pericardial effusion. Mild mesenteric edema. Large left renal cyst. Fullness of both renal collecting systems with suspected nonobstructive left nephrolithiasis. Degenerative grade 1 anterolisthesis at L4-5 at L5-S1. Trace pelvic ascites."  05/28/2019 PET/CT ([id number]) revealed "Complete metabolic response to therapy".   2. Pulmonary Embolism -extensive .likely related to extensive malignancy  3. H/o Breast Cancer  -The patient had bilateral diagnostic  mammography at [facility] 07/11/2011. This  showed some calcifications in the right  breast, which seemed a little bit more  prominent than prior. In the left breast  there was an area of possible  architectural distortion. However left  breast ultrasound the same day showed  no abnormality. 6 month followup was  suggested, and on 02/28/2012 the  patient again had bilateral diagnostic  mammography, now with right  ultrasonography. The microcalcifications  in the right breast appeared  increased.  Ultrasound showed a hypoechoic lesion  measuring 5 mm, with no associated  shadowing. This had been previously  noted and appeared unchanged. Anterior  to that there was a small hypoechoic  mass measuring 4 mm in diameter.  There was felt to be suspicious, and on  03/14/2012 the patient underwent biopsy  of the right breast mass, showing ([id number]  [id number]) ductal carcinoma in situ,  intermediate grade, estrogen receptor  100% and progesterone receptor 100%  positive.   -Bilateral breast MRI was obtained  03/26/2012. This showed only post  biopsy changes in the right breast,  associated with an area of non-masslike  enhancement measuring 2.4 cm. The  left breast was unremarkable, and there  was no enlarged axillary or internal  mammary adenopathy noted.  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 The patient's  subsequent history is as detailed below.   4. Rt breast discomfort on palpable , nipple ulceration and bloody discharge. PLAN: -Discussed pt labwork today, 09/24/19; blood counts and chemistries are steady, LDH is WNL -Discussed 09/16/2019 PET/CT ([id number]) which revealed "1. Unfortunately evidence of lymphoma recurrence at sites of previous hypermetabolic adenopathy. Hypermetabolic lymphoid  tissue is new from comparison PET-CT 05/28/2019 but at same locations as PET-CT 04/08/2019. The number of metastatic lymph nodes is less than 04/08/2019. Lymphoid tissue at the RIGHT skull base is larger. 2. Three foci of hypermetabolic recurrence are present, lymphoid tissue at the RIGHT base of skull, hypermetabolic RIGHT hilar lymph node and hypermetabolic lymph node in the porta hepatis." -Advised pt that she has an aggressive lymphoma, which will continue to return.  -Advised pt that our goal with treatment will be to allow her to live  as long as possible with minimal symptom burden.  -Would prefer to restart treatment with Brentuximab, as she has responded well previously.  -If she did not respond would consider using dose-adjusted Bendamustine.  -If labs are stable and rash responds would repeat scan after C4. -Advised pt to complete her current course of steroids  -Will begin Brentuximab in 2-3 days  -Will see back with second cycle of Brentuximab-vedotin   FOLLOW-UP: Plz schedule to start Brentuximab-vedotin ASAP in 2-3 days- plz schedule 2 cycles q3weeks with portflush and labs MD visit with next cycle of Brentuximab-vedotin   The total time spent in the appt was 30 minutes and more than 50% was on counseling and direct patient cares.  All of the patient's questions were answered with apparent satisfaction. The patient knows to call the clinic with any problems, questions or concerns.    Sullivan Lone MD [place] AAHIV[facility] Hematology/Oncology Physician [facility]  (Office):       [phone] (Work cell):  [phone] (Fax):           [phone]  09/24/2019 3:39 PM  I, Yevette Edwards, am acting as a scribe for Dr. Sullivan Lone.   .I have reviewed the above documentation for accuracy and completeness, and I agree with the above. Brunetta Genera MD

## 2019-09-26 ENCOUNTER — Other Ambulatory Visit: Payer: Self-pay | Admitting: *Deleted

## 2019-09-26 ENCOUNTER — Telehealth: Payer: Self-pay | Admitting: Hematology

## 2019-09-26 DIAGNOSIS — C8440 Peripheral T-cell lymphoma, not classified, unspecified site: Secondary | ICD-10-CM

## 2019-09-26 NOTE — Progress Notes (Signed)
Pharmacist Chemotherapy Monitoring - Follow Up Assessment    I verify that I have reviewed each item in the below checklist:  . Regimen for the patient is scheduled for the appropriate day and plan matches scheduled date. Marland Kitchen Appropriate non-routine labs are ordered dependent on drug ordered. . If applicable, additional medications reviewed and ordered per protocol based on lifetime cumulative doses and/or treatment regimen.   Plan for follow-up and/or issues identified: No . I-vent associated with next due treatment: No . MD and/or nursing notified: No  Romualdo Bolk [facility] 09/26/2019 1:58 PM

## 2019-09-26 NOTE — Telephone Encounter (Signed)
Scheduled per 05/05 los, called and spoke with patient's daughter about upcoming appointments. Patient will be notified.

## 2019-09-29 ENCOUNTER — Other Ambulatory Visit: Payer: Self-pay

## 2019-09-29 ENCOUNTER — Inpatient Hospital Stay: Payer: Medicare Other | Admitting: Hematology

## 2019-09-29 ENCOUNTER — Inpatient Hospital Stay: Payer: Medicare Other

## 2019-09-29 DIAGNOSIS — Z5112 Encounter for antineoplastic immunotherapy: Secondary | ICD-10-CM | POA: Diagnosis not present

## 2019-09-29 DIAGNOSIS — Z95828 Presence of other vascular implants and grafts: Secondary | ICD-10-CM

## 2019-09-29 DIAGNOSIS — C8440 Peripheral T-cell lymphoma, not classified, unspecified site: Secondary | ICD-10-CM

## 2019-09-29 DIAGNOSIS — Z853 Personal history of malignant neoplasm of breast: Secondary | ICD-10-CM | POA: Diagnosis not present

## 2019-09-29 DIAGNOSIS — Z452 Encounter for adjustment and management of vascular access device: Secondary | ICD-10-CM | POA: Diagnosis not present

## 2019-09-29 DIAGNOSIS — C865 Angioimmunoblastic T-cell lymphoma: Secondary | ICD-10-CM | POA: Diagnosis not present

## 2019-09-29 DIAGNOSIS — Z7189 Other specified counseling: Secondary | ICD-10-CM

## 2019-09-29 DIAGNOSIS — I2699 Other pulmonary embolism without acute cor pulmonale: Secondary | ICD-10-CM | POA: Diagnosis not present

## 2019-09-29 LAB — CMP (CANCER CENTER ONLY)
ALT: 17 U/L (ref 0–44)
AST: 18 U/L (ref 15–41)
Albumin: 3.6 g/dL (ref 3.5–5.0)
Alkaline Phosphatase: 64 U/L (ref 38–126)
Anion gap: 13 (ref 5–15)
BUN: 23 mg/dL (ref 8–23)
CO2: 25 mmol/L (ref 22–32)
Calcium: 9.4 mg/dL (ref 8.9–10.3)
Chloride: 105 mmol/L (ref 98–111)
Creatinine: 0.7 mg/dL (ref 0.44–1.00)
GFR, Est AFR Am: >60 mL/min (ref >60)
GFR, Estimated: >60 mL/min (ref >60)
Glucose, Bld: 97 mg/dL (ref 70–99)
Potassium: 3.8 mmol/L (ref 3.5–5.1)
Sodium: 143 mmol/L (ref 135–145)
Total Bilirubin: 0.4 mg/dL (ref 0.3–1.2)
Total Protein: 6.9 g/dL (ref 6.5–8.1)

## 2019-09-29 LAB — CBC WITH DIFFERENTIAL (CANCER CENTER ONLY)
Abs Immature Granulocytes: 0.03 10*3/uL (ref 0.00–0.07)
Basophils Absolute: 0.1 10*3/uL (ref 0.0–0.1)
Basophils Relative: 1 %
Eosinophils Absolute: 0.1 10*3/uL (ref 0.0–0.5)
Eosinophils Relative: 2 %
HCT: 42 % (ref 36.0–46.0)
Hemoglobin: 13.4 g/dL (ref 12.0–15.0)
Immature Granulocytes: 0 %
Lymphocytes Relative: 13 %
Lymphs Abs: 1 10*3/uL (ref 0.7–4.0)
MCH: 28.1 pg (ref 26.0–34.0)
MCHC: 31.9 g/dL (ref 30.0–36.0)
MCV: 88.1 fL (ref 80.0–100.0)
Monocytes Absolute: 0.6 10*3/uL (ref 0.1–1.0)
Monocytes Relative: 7 %
Neutro Abs: 6.2 10*3/uL (ref 1.7–7.7)
Neutrophils Relative %: 77 %
Platelet Count: 234 10*3/uL (ref 150–400)
RBC: 4.77 MIL/uL (ref 3.87–5.11)
RDW: 13.5 % (ref 11.5–15.5)
WBC Count: 8 10*3/uL (ref 4.0–10.5)
nRBC: 0 % (ref 0.0–0.2)

## 2019-09-29 LAB — URIC ACID: Uric Acid, Serum: 5.1 mg/dL (ref 2.5–7.1)

## 2019-09-29 MED ORDER — DIPHENHYDRAMINE HCL 50 MG/ML IJ SOLN
INTRAMUSCULAR | Status: AC
  Filled 2019-09-29: qty 1

## 2019-09-29 MED ORDER — SODIUM CHLORIDE 0.9 % IV SOLN
Freq: Once | INTRAVENOUS | Status: AC
  Filled 2019-09-29: qty 250

## 2019-09-29 MED ORDER — SODIUM CHLORIDE 0.9 % IV SOLN
1.2000 mg/kg | Freq: Once | INTRAVENOUS | Status: AC
  Administered 2019-09-29: 65 mg via INTRAVENOUS
  Filled 2019-09-29: qty 13

## 2019-09-29 MED ORDER — ONDANSETRON HCL 4 MG/2ML IJ SOLN
4.0000 mg | Freq: Once | INTRAMUSCULAR | Status: AC
  Administered 2019-09-29: 4 mg via INTRAVENOUS

## 2019-09-29 MED ORDER — DEXAMETHASONE SODIUM PHOSPHATE 10 MG/ML IJ SOLN
INTRAMUSCULAR | Status: AC
  Filled 2019-09-29: qty 1

## 2019-09-29 MED ORDER — DIPHENHYDRAMINE HCL 50 MG/ML IJ SOLN
50.0000 mg | Freq: Once | INTRAMUSCULAR | Status: AC
  Administered 2019-09-29: 50 mg via INTRAVENOUS

## 2019-09-29 MED ORDER — ACETAMINOPHEN 325 MG PO TABS
650.0000 mg | ORAL_TABLET | Freq: Once | ORAL | Status: AC
  Administered 2019-09-29: 650 mg via ORAL

## 2019-09-29 MED ORDER — FAMOTIDINE IN NACL 20-0.9 MG/50ML-% IV SOLN
INTRAVENOUS | Status: AC
  Filled 2019-09-29: qty 50

## 2019-09-29 MED ORDER — FAMOTIDINE IN NACL 20-0.9 MG/50ML-% IV SOLN
20.0000 mg | Freq: Once | INTRAVENOUS | Status: AC
  Administered 2019-09-29: 20 mg via INTRAVENOUS

## 2019-09-29 MED ORDER — SODIUM CHLORIDE 0.9% FLUSH
10.0000 mL | Freq: Once | INTRAVENOUS | Status: AC
  Administered 2019-09-29: 10 mL
  Filled 2019-09-29: qty 10

## 2019-09-29 MED ORDER — HEPARIN SOD (PORK) LOCK FLUSH 100 UNIT/ML IV SOLN
500.0000 [IU] | Freq: Once | INTRAVENOUS | Status: AC | PRN
  Administered 2019-09-29: 500 [IU]
  Filled 2019-09-29: qty 5

## 2019-09-29 MED ORDER — ACETAMINOPHEN 325 MG PO TABS
ORAL_TABLET | ORAL | Status: AC
  Filled 2019-09-29: qty 2

## 2019-09-29 MED ORDER — SODIUM CHLORIDE 0.9% FLUSH
10.0000 mL | INTRAVENOUS | Status: DC | PRN
  Administered 2019-09-29: 10 mL
  Filled 2019-09-29: qty 10

## 2019-09-29 MED ORDER — DEXAMETHASONE SODIUM PHOSPHATE 10 MG/ML IJ SOLN
5.0000 mg | Freq: Once | INTRAMUSCULAR | Status: AC
  Administered 2019-09-29: 5 mg via INTRAVENOUS

## 2019-09-29 MED ORDER — ONDANSETRON HCL 4 MG/2ML IJ SOLN
INTRAMUSCULAR | Status: AC
  Filled 2019-09-29: qty 2

## 2019-09-29 NOTE — Patient Instructions (Signed)
[place] Discharge Instructions for Patients Receiving Chemotherapy  Today you received the following chemotherapy agents: Adcetris  To help prevent nausea and vomiting after your treatment, we encourage you to take your nausea medication as directed.   If you develop nausea and vomiting that is not controlled by your nausea medication, call the clinic.   BELOW ARE SYMPTOMS THAT SHOULD BE REPORTED IMMEDIATELY:  *FEVER GREATER THAN 100.5 F  *CHILLS WITH OR WITHOUT FEVER  NAUSEA AND VOMITING THAT IS NOT CONTROLLED WITH YOUR NAUSEA MEDICATION  *UNUSUAL SHORTNESS OF BREATH  *UNUSUAL BRUISING OR BLEEDING  TENDERNESS IN MOUTH AND THROAT WITH OR WITHOUT PRESENCE OF ULCERS  *URINARY PROBLEMS  *BOWEL PROBLEMS  UNUSUAL RASH Items with * indicate a potential emergency and should be followed up as soon as possible.  Feel free to call the clinic should you have any questions or concerns. The clinic phone number is [phone].  Please show the [place] at check-in to the Emergency Department and triage nurse.

## 2019-09-29 NOTE — Patient Instructions (Signed)

## 2019-09-29 NOTE — Progress Notes (Signed)
Patient c/o itching bilat lower extremities. Contacted Sandi Mealy and received verbal order for additional Pepcid 20 mg IV

## 2019-10-01 ENCOUNTER — Other Ambulatory Visit: Payer: Self-pay

## 2019-10-01 ENCOUNTER — Encounter: Payer: Self-pay | Admitting: Hematology

## 2019-10-01 ENCOUNTER — Encounter (INDEPENDENT_AMBULATORY_CARE_PROVIDER_SITE_OTHER): Payer: Medicare Other | Admitting: Ophthalmology

## 2019-10-01 DIAGNOSIS — H35033 Hypertensive retinopathy, bilateral: Secondary | ICD-10-CM | POA: Diagnosis not present

## 2019-10-01 DIAGNOSIS — R928 Other abnormal and inconclusive findings on diagnostic imaging of breast: Secondary | ICD-10-CM | POA: Diagnosis not present

## 2019-10-01 DIAGNOSIS — H353132 Nonexudative age-related macular degeneration, bilateral, intermediate dry stage: Secondary | ICD-10-CM

## 2019-10-01 DIAGNOSIS — H43813 Vitreous degeneration, bilateral: Secondary | ICD-10-CM

## 2019-10-01 DIAGNOSIS — I10 Essential (primary) hypertension: Secondary | ICD-10-CM | POA: Diagnosis not present

## 2019-10-05 NOTE — Progress Notes (Signed)
This encounter was created in error - please disregard.

## 2019-10-21 ENCOUNTER — Other Ambulatory Visit: Payer: Self-pay

## 2019-10-21 ENCOUNTER — Inpatient Hospital Stay: Payer: Medicare Other | Attending: Hematology

## 2019-10-21 ENCOUNTER — Inpatient Hospital Stay (HOSPITAL_BASED_OUTPATIENT_CLINIC_OR_DEPARTMENT_OTHER): Payer: Medicare Other | Admitting: Hematology

## 2019-10-21 ENCOUNTER — Inpatient Hospital Stay: Payer: Medicare Other

## 2019-10-21 VITALS — BP 142/73 | HR 85 | Temp 97.7°F | Resp 18 | Ht 64.0 in | Wt 118.6 lb

## 2019-10-21 DIAGNOSIS — R05 Cough: Secondary | ICD-10-CM | POA: Insufficient documentation

## 2019-10-21 DIAGNOSIS — Z853 Personal history of malignant neoplasm of breast: Secondary | ICD-10-CM | POA: Diagnosis not present

## 2019-10-21 DIAGNOSIS — E039 Hypothyroidism, unspecified: Secondary | ICD-10-CM | POA: Insufficient documentation

## 2019-10-21 DIAGNOSIS — J90 Pleural effusion, not elsewhere classified: Secondary | ICD-10-CM | POA: Insufficient documentation

## 2019-10-21 DIAGNOSIS — C8440 Peripheral T-cell lymphoma, not classified, unspecified site: Secondary | ICD-10-CM

## 2019-10-21 DIAGNOSIS — I700 Atherosclerosis of aorta: Secondary | ICD-10-CM | POA: Insufficient documentation

## 2019-10-21 DIAGNOSIS — Z452 Encounter for adjustment and management of vascular access device: Secondary | ICD-10-CM | POA: Insufficient documentation

## 2019-10-21 DIAGNOSIS — C865 Angioimmunoblastic T-cell lymphoma: Secondary | ICD-10-CM | POA: Insufficient documentation

## 2019-10-21 DIAGNOSIS — E7800 Pure hypercholesterolemia, unspecified: Secondary | ICD-10-CM | POA: Insufficient documentation

## 2019-10-21 DIAGNOSIS — Z5112 Encounter for antineoplastic immunotherapy: Secondary | ICD-10-CM | POA: Diagnosis present

## 2019-10-21 DIAGNOSIS — I2699 Other pulmonary embolism without acute cor pulmonale: Secondary | ICD-10-CM | POA: Insufficient documentation

## 2019-10-21 DIAGNOSIS — R911 Solitary pulmonary nodule: Secondary | ICD-10-CM | POA: Diagnosis not present

## 2019-10-21 DIAGNOSIS — R161 Splenomegaly, not elsewhere classified: Secondary | ICD-10-CM | POA: Insufficient documentation

## 2019-10-21 DIAGNOSIS — Z5111 Encounter for antineoplastic chemotherapy: Secondary | ICD-10-CM | POA: Diagnosis not present

## 2019-10-21 DIAGNOSIS — Z7189 Other specified counseling: Secondary | ICD-10-CM

## 2019-10-21 DIAGNOSIS — R269 Unspecified abnormalities of gait and mobility: Secondary | ICD-10-CM | POA: Insufficient documentation

## 2019-10-21 DIAGNOSIS — I2510 Atherosclerotic heart disease of native coronary artery without angina pectoris: Secondary | ICD-10-CM | POA: Insufficient documentation

## 2019-10-21 DIAGNOSIS — F419 Anxiety disorder, unspecified: Secondary | ICD-10-CM | POA: Insufficient documentation

## 2019-10-21 DIAGNOSIS — A044 Other intestinal Escherichia coli infections: Secondary | ICD-10-CM | POA: Diagnosis not present

## 2019-10-21 DIAGNOSIS — R530 Neoplastic (malignant) related fatigue: Secondary | ICD-10-CM | POA: Diagnosis not present

## 2019-10-21 DIAGNOSIS — Z95828 Presence of other vascular implants and grafts: Secondary | ICD-10-CM

## 2019-10-21 DIAGNOSIS — R21 Rash and other nonspecific skin eruption: Secondary | ICD-10-CM | POA: Diagnosis not present

## 2019-10-21 LAB — CMP (CANCER CENTER ONLY)
ALT: 11 U/L (ref 0–44)
AST: 13 U/L — ABNORMAL LOW (ref 15–41)
Albumin: 3.6 g/dL (ref 3.5–5.0)
Alkaline Phosphatase: 76 U/L (ref 38–126)
Anion gap: 11 (ref 5–15)
BUN: 17 mg/dL (ref 8–23)
CO2: 25 mmol/L (ref 22–32)
Calcium: 9.5 mg/dL (ref 8.9–10.3)
Chloride: 105 mmol/L (ref 98–111)
Creatinine: 0.72 mg/dL (ref 0.44–1.00)
GFR, Est AFR Am: >60 mL/min (ref >60)
GFR, Estimated: >60 mL/min (ref >60)
Glucose, Bld: 80 mg/dL (ref 70–99)
Potassium: 4.5 mmol/L (ref 3.5–5.1)
Sodium: 141 mmol/L (ref 135–145)
Total Bilirubin: 0.5 mg/dL (ref 0.3–1.2)
Total Protein: 6.8 g/dL (ref 6.5–8.1)

## 2019-10-21 LAB — CBC WITH DIFFERENTIAL (CANCER CENTER ONLY)
Abs Immature Granulocytes: 0.02 10*3/uL (ref 0.00–0.07)
Basophils Absolute: 0 10*3/uL (ref 0.0–0.1)
Basophils Relative: 1 %
Eosinophils Absolute: 0.3 10*3/uL (ref 0.0–0.5)
Eosinophils Relative: 5 %
HCT: 42.7 % (ref 36.0–46.0)
Hemoglobin: 13.7 g/dL (ref 12.0–15.0)
Immature Granulocytes: 0 %
Lymphocytes Relative: 8 %
Lymphs Abs: 0.4 10*3/uL — ABNORMAL LOW (ref 0.7–4.0)
MCH: 28.6 pg (ref 26.0–34.0)
MCHC: 32.1 g/dL (ref 30.0–36.0)
MCV: 89.1 fL (ref 80.0–100.0)
Monocytes Absolute: 0.6 10*3/uL (ref 0.1–1.0)
Monocytes Relative: 11 %
Neutro Abs: 3.8 10*3/uL (ref 1.7–7.7)
Neutrophils Relative %: 75 %
Platelet Count: 187 10*3/uL (ref 150–400)
RBC: 4.79 MIL/uL (ref 3.87–5.11)
RDW: 14.3 % (ref 11.5–15.5)
WBC Count: 5.1 10*3/uL (ref 4.0–10.5)
nRBC: 0 % (ref 0.0–0.2)

## 2019-10-21 LAB — LACTATE DEHYDROGENASE: LDH: 216 U/L — ABNORMAL HIGH (ref 98–192)

## 2019-10-21 LAB — URIC ACID: Uric Acid, Serum: 4.2 mg/dL (ref 2.5–7.1)

## 2019-10-21 MED ORDER — SODIUM CHLORIDE 0.9% FLUSH
10.0000 mL | INTRAVENOUS | Status: DC | PRN
  Administered 2019-10-21: 10 mL
  Filled 2019-10-21: qty 10

## 2019-10-21 MED ORDER — ALTEPLASE 2 MG IJ SOLR
2.0000 mg | Freq: Once | INTRAMUSCULAR | Status: AC
  Administered 2019-10-21: 2 mg
  Filled 2019-10-21: qty 2

## 2019-10-21 MED ORDER — HEPARIN SOD (PORK) LOCK FLUSH 100 UNIT/ML IV SOLN
500.0000 [IU] | Freq: Once | INTRAVENOUS | Status: AC | PRN
  Administered 2019-10-21: 500 [IU]
  Filled 2019-10-21: qty 5

## 2019-10-21 MED ORDER — DIPHENHYDRAMINE HCL 50 MG/ML IJ SOLN
INTRAMUSCULAR | Status: AC
  Filled 2019-10-21: qty 1

## 2019-10-21 MED ORDER — SODIUM CHLORIDE 0.9 % IV SOLN
Freq: Once | INTRAVENOUS | Status: AC
  Filled 2019-10-21: qty 250

## 2019-10-21 MED ORDER — DIPHENHYDRAMINE HCL 50 MG/ML IJ SOLN
50.0000 mg | Freq: Once | INTRAMUSCULAR | Status: AC
  Administered 2019-10-21: 50 mg via INTRAVENOUS

## 2019-10-21 MED ORDER — ACETAMINOPHEN 325 MG PO TABS
650.0000 mg | ORAL_TABLET | Freq: Once | ORAL | Status: AC
  Administered 2019-10-21: 650 mg via ORAL

## 2019-10-21 MED ORDER — SODIUM CHLORIDE 0.9 % IV SOLN
1.8000 mg/kg | Freq: Once | INTRAVENOUS | Status: AC
  Administered 2019-10-21: 100 mg via INTRAVENOUS
  Filled 2019-10-21: qty 20

## 2019-10-21 MED ORDER — FAMOTIDINE IN NACL 20-0.9 MG/50ML-% IV SOLN
20.0000 mg | Freq: Once | INTRAVENOUS | Status: AC
  Administered 2019-10-21: 20 mg via INTRAVENOUS

## 2019-10-21 MED ORDER — ACETAMINOPHEN 325 MG PO TABS
ORAL_TABLET | ORAL | Status: AC
  Filled 2019-10-21: qty 2

## 2019-10-21 MED ORDER — DEXAMETHASONE SODIUM PHOSPHATE 10 MG/ML IJ SOLN
5.0000 mg | Freq: Once | INTRAMUSCULAR | Status: AC
  Administered 2019-10-21: 5 mg via INTRAVENOUS

## 2019-10-21 MED ORDER — DEXAMETHASONE SODIUM PHOSPHATE 10 MG/ML IJ SOLN
INTRAMUSCULAR | Status: AC
  Filled 2019-10-21: qty 1

## 2019-10-21 MED ORDER — ONDANSETRON HCL 4 MG/2ML IJ SOLN
INTRAMUSCULAR | Status: AC
  Filled 2019-10-21: qty 2

## 2019-10-21 MED ORDER — ALTEPLASE 2 MG IJ SOLR
INTRAMUSCULAR | Status: AC
  Filled 2019-10-21: qty 2

## 2019-10-21 MED ORDER — FAMOTIDINE IN NACL 20-0.9 MG/50ML-% IV SOLN
INTRAVENOUS | Status: AC
  Filled 2019-10-21: qty 50

## 2019-10-21 MED ORDER — SODIUM CHLORIDE 0.9% FLUSH
10.0000 mL | Freq: Once | INTRAVENOUS | Status: AC
  Administered 2019-10-21: 10 mL
  Filled 2019-10-21: qty 10

## 2019-10-21 MED ORDER — ONDANSETRON HCL 4 MG/2ML IJ SOLN
4.0000 mg | Freq: Once | INTRAMUSCULAR | Status: AC
  Administered 2019-10-21: 4 mg via INTRAVENOUS

## 2019-10-21 NOTE — Progress Notes (Signed)
Patient had labs drawn from me peripherally, after CATHFLO was administered.

## 2019-10-21 NOTE — Progress Notes (Signed)
HEMATOLOGY/ONCOLOGY CLINIC NOTE  Date of Service: 10/21/2019  Patient Care Team: Lajean Manes, MD as PCP - General (Internal Medicine) Buford Dresser, MD as PCP - Cardiology (Cardiology)  REFERRING PHYSICIAN: Lajean Manes, MD  CHIEF COMPLAINTS/PURPOSE OF CONSULTATION:  Continue mx of Recently Diagnosed Angioimmunoblastic T cell lymphoma      HISTORY OF PRESENTING ILLNESS:   Sarah [name] is a wonderful 86 y.o. female who has been referred to [place] by Lajean Manes, MD for evaluation and management of Possible lymphoma . She is accompanied today by her daughter Sarah [name] . The pt reports that she is doing well overall.   Pending Surgical pathology from 03/17/2019 Dr.Manny will contact clinic about pathology   August 25th/27th she began coughing and sneezing. On Sept 3 went to doctors and they said it was allergies. She began having rashes (little red dots). Went to PCP and they prescribed prednisone and for several days and the medication helped. But as soon as she completed the prescription symptoms reoccurred.   She had a chest X Ray done because she was having a severe cough on 02/17/2019 revealing "bilateral effusions in this patient with history of breast cancer,of uncertain significance with question of right lower lobe pulmonary nodule, consider chest CT for further assessment. Atherosclerotic changes in the thoracic aorta."  Dr. Amy Martinique biopsied the rashes on her back 02/21/2019 and it was determined that it could be because of an allergy to medication. Dr.Stoneking stopped her blood pressure medication from September- October but ther rash still present. The itching started 1 to 2 months ago. The rash would come and go and was mostly on her back. She still has rash on her left thigh.  She is using triaconazole and cetrizine.  She has lumps behind ear and abdomin and she states that they have swollen twice the size within 1 and half months  She has no appetite and  has changes in her taste that began around August. She has changes in bowels. They are more loose and smaller In size. She also has no control over bowels.  She feels fatigued and it has increased since August.   She has a cough that starts in September  that has made her horse.   She had a CT scan on 02/27/2019 because she was having severe cough and her stomach was very extended. Revealing "1. Extensive lymphadenopathy throughout the chest, abdomen, and pelvis, with index lymph nodes identified above. Splenomegaly. Findings are most consistent with lymphoma with recurrent, metastatic breast malignancy less favored. 2. There is generally osteopenic appearance of the skeleton. There are numerous subtle hypodense lesions, particularly of the pelvis (e.g. Series 2, image 88) and select vertebral bodies, for example L4 (series 8, image 92). This is suspicious although not definitive for osseous metastatic disease. Characterization for metabolic activity by nuclear scintigraphic bone scan or PET-CT would be helpful to further evaluate. 3. Moderate right, small left pleural effusions with associated atelectasis or consolidation. Subpleural radiation fibrosis of the anterior right lung. 4. There is a new subpleural pulmonary nodule of the anterior right middle lobe measuring 6 mm (series 4, image 105), nonspecific. There is no obvious pleural thickening or nodularity definitive for metastatic disease. 5.  Small volume ascites. 6. There is a fluid attenuation lesion of the pancreatic uncinate measuring 2.4 x 1.3 cm (series 2, image 63), not substantially changed when compared to remote prior MR examination dated 02/23/2012 and likely sequelae of a prior pseudocyst versus incidental pancreatic IPMN. 7.  Cardiomegaly."  Hospitalized from 03/06/2019-03/08/2019. She presenting with acute shortness of breath, leg swelling,left hand swelling and some weight gain despite poor appetite. In ED, CTA chest revealed at least  submassive PE, moderate right pleural effusion, diffuse LAD concerning for lymphoma. Started on heparin drip. PCCM consulted for guidance. She had thoracocentesis with removal of 1200 cc cloudy exudative pleural fluid. Cardiology consulted for elevated which was thought to be demand ischemia from right heart strain in the setting of PE. Patient was transitioned to subcu Lovenox by pulmonology the next day and remained stable. Unusual appearance of tracheal air column at the thoracic inlet. Correlate with any history of swallowing difficulty or changes in voice with dedicated imaging with chest CT and or CT of the neck as indicated.  She has a nonhealing ulcer on her right tibialis anterior that starting weeping one week ago. Of note since the patient's last visit, pt has had CHEST XRAY - 2 VIEW (Accession [id number]) completed on 02/17/2019 with results revealing "Bilateral effusions in this patient with history of breast cancer,of uncertain significance with question of right lower lobepulmonary nodule, consider chest CT for furtherassessment.Unusual appearance of tracheal air column at the thoracic inlet.Correlate with any history of swallowing difficulty or changes in voice with dedicated imaging with chest CT and or CT of the neck as Indicated. Atherosclerotic changes in the thoracic aorta."  Of note since the patient's last visit, pt has had CT CHEST, ABDOMEN, AND PELVIS WITH CONTRAST (Accession [id number]) completed on 02/27/2019 with results revealing "1. Extensive lymphadenopathy throughout the chest, abdomen, and pelvis, with index lymph nodes identified above. Splenomegaly.Findings are most consistent with lymphoma with recurrent, metastatic breast malignancy less favored. 2. There is generally osteopenic appearance of the skeleton. There are numerous subtle hypodense lesions, particularly of the pelvis (e.g. Series 2, image 88) and select vertebral bodies, for example L4 (series 8, image 92).  This is suspicious although not definitive for osseous metastatic disease. Characterization for metabolic activity by nuclear scintigraphic bone scan or PET-CT would be helpful to further evaluate.  3. Moderate right, small left pleural effusions with associated atelectasis or consolidation. Subpleural radiation fibrosis of the anterior right lung.4. There is a new subpleural pulmonary nodule of the anterior right middle lobe measuring 6 mm (series 4, image 105), nonspecific. There is no obvious pleural thickening or nodularity definitive for metastatic disease.5.  Small volume ascites. 6. There is a fluid attenuation lesion of the pancreatic uncinate measuring 2.4 x 1.3 cm (series 2, image 63), not substantially changed when compared to remote prior MR examination dated 02/23/2012 and likely sequelae of a prior pseudocyst versus incidental pancreatic IPMN.7.  Cardiomegaly.8. Other chronic and incidental findings as detailed above. Aortic Atherosclerosis (ICD10-I70.0)."  Of note since the patient's last visit, pt has had PORTABLE CHEST 1 VIEW (Accession [id number]) completed on 03/06/2019 with results revealing "1. Moderate size right and small left pleural effusions appear not significantly changed since 02/27/19. 2. No new cardiopulmonary abnormality."  Of note since the patient's last visit, pt has had ECHOCARDIOGRAM  completed on 03/06/2019 with results revealing  "1. Left ventricular ejection fraction, by visual estimation, is 60 to 65%. The left ventricle has normal function. Normal left ventricular size. There is no left ventricular hypertrophy. 2. Left ventricular diastolic Doppler parameters are consistent with impaired relaxation pattern of LV diastolic filling. 3. Global right ventricle has mildly reduced systolic function.The right ventricular size is moderately enlarged. No increase in right ventricular wall thickness. 4. Left atrial size was normal.  5. Right atrial size was mildly dilated.  6. Trivial pericardial effusion is present. 7. The mitral valve is normal in structure. Trace mitral valve regurgitation. No evidence of mitral stenosis. 8. The tricuspid valve is normal in structure. Tricuspid valve regurgitation severe. 9. The aortic valve is normal in structure. Aortic valve regurgitation was not visualized by color flow Doppler. Structurally normal aortic valve, with no evidence of sclerosis or stenosis.10. The pulmonic valve was normal in structure. Pulmonic valve regurgitation is mild by color flow Doppler.11. Mildly elevated pulmonary artery systolic pressure.12. The inferior vena cava is normal in size with greater than 50% respiratory variability, suggesting right atrial pressure of 3 mmHg."  Of note since the patient's last visit, pt has had CT ANGIOGRAPHY CHEST WITH CONTRAST (Accession [id number]) completed on 03/06/2019 with results revealing "1. Pulmonary embolus arising from the distal left main pulmonary artery with extension into multiple left lower lobe pulmonary arterial branches. There is also incompletely obstructing pulmonary embolus in the proximal left upper lobe pulmonary artery. More distal pulmonary embolus in the right lower lobe pulmonary artery. Positive for acute PE with CT evidence of right heart strain (RV/LV Ratio = 1.6) consistent with at least submassive (intermediate risk) PE. The presence of right heart strain has been associated with an increased risk of morbidity and mortality. Please activate Code PE by paging [phone]. 2. Sizable pleural effusion on the right with smaller pleural effusion on the left. Consolidation and compressive atelectasis throughout most of the right lower lobe. Milder atelectasis left base. Nodular opacities on the right, likely metastatic foci.3.  Stable adenopathy compared to 1 week prior.4. Enlargement of spleen, incompletely visualized but documented CT 1 week prior. Concern for potential lymphoma. 5. Aortic atherosclerosis.  No aneurysm or dissection evident. Foci of coronary artery calcification noted."  Most recent lab results (03/06/2019) of CBC is as follows: all values are WNL except for Platelets at 72, Eosinophils Absolute at 0.6,Abs Immature Granulocytes at 0.08 .  On review of systems, pt reports rashes, digestive issues and denies back pain, abdominal pain and any other symptoms.   On PMHx the pt reports Hypothyroidism, MCI, Hypercholesterolemia, Tracheal anomaly, Hypertension, Pulmonary nodule, Atherosclerosis of aorta, anxiety, gait disorder, primary insomnia.   INTERVAL HISTORY: Sarah [name] is a 86 y.o. female here for evaluation and management of Angioimmunoblastic T-cell lymphoma. We are joined today by her daughter, Sarah [name]. She is here for C2 of Brentuximab. The patient's last visit with [place] was on 09/24/2019. The pt reports that she is doing well overall.  The pt reports that her rash has improved since the last visit, but is still bothersome. Pt awoke last night coughing, sneezing and with a runny nose. She is having a productive cough that is usually clear, but is occasionally yellow in color. Pt is concerned about increasing weakness, especially in her knees. She has had both knees replaced previously. Pt reports that her symptoms were much better the first two weeks after treatment. Pt has been eating well and is gaining weight. She has an upcoming appointment with Dr. Noemi Chapel on 06/09. Pt had a repeat breast exam, which found no evidence of malignancy.   Lab results today (10/21/19) of CBC w/diff and CMP is as follows: all values are WNL except for Lymphs Abs at 0.4K, AST at 13. 10/21/2019 LDH at 216 10/21/2019 Uric acid at 4.2  On review of systems, pt reports rhinorrhea, productive cough, sneezing, fatigue, weakness, rash and denies SOB, leg swelling, tingling/numbness in hands or  feet, fevers, chills, night sweats, low appetite, abdominal pain and any other symptoms.   MEDICAL HISTORY:    Past Medical History:  Diagnosis Date  . Allergy    codeine, thiazides  . Anxiety    new dx  . Arthritis   . Breast cancer ([place]) 03/14/12   bx=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,ER/PR=+,upper inner quad  . Cancer ([place])    breast  . Cataract   . DVT (deep venous thrombosis) ([place]) 02/2019   left leg  . Dyspnea   . Edema    both legs feet and toe, abdomen  . Glaucoma    laser treated years ago  . HOH (hard of hearing)   . Hyperlipemia   . Hypertension   . Hypothyroidism   . Pancreatic cyst    benign  . PONV (postoperative nausea and vomiting)   . Pulmonary embolism ([place]) 02/2019   bilateral   . Radiation 06/11/2012-07/12/2012   17 sessions 4250 cGy, 3 sessions 750 cGy  . Vertigo   . Wears glasses   . Wears partial dentures    partial upper     SURGICAL HISTORY: Past Surgical History:  Procedure Laterality Date  . ABDOMINAL HYSTERECTOMY  1966   1/2 ovary left in   . BREAST SURGERY  1992   lumpectomy-lt  . CATARACT EXTRACTION     b/l  . COLONOSCOPY    . EXCISION MASS NECK Left 03/17/2019    EXCISION MASS NECK (Left Neck)  . EXCISION MASS NECK Left 03/17/2019   Procedure: EXCISION MASS NECK;  Surgeon: Helayne Seminole, MD;  Location: [facility];  Service: ENT;  Laterality: Left;  . EYE SURGERY Bilateral    bilateral cataract removal  . IR IMAGING GUIDED PORT INSERTION  04/23/2019  . JOINT REPLACEMENT  2013   rt total knee  . JOINT REPLACEMENT  1995   lt total knee  . PARTIAL MASTECTOMY WITH NEEDLE LOCALIZATION  04/09/2012   Procedure: PARTIAL MASTECTOMY WITH NEEDLE LOCALIZATION;  Surgeon: Adin Hector, MD;  Location: [facility];  Service: General;  Laterality: Right;  . SKIN BIOPSY Left 03/17/2019   LEFT THIGH  . SKIN BIOPSY Left 03/17/2019   Procedure: Skin Biopsy Left Thigh;  Surgeon: Helayne Seminole, MD;  Location: [facility];  Service: ENT;  Laterality: Left;  . TONSILLECTOMY    . TOTAL KNEE ARTHROPLASTY  08/16/2011    Procedure: TOTAL KNEE ARTHROPLASTY;  Surgeon: Ninetta Lights, MD;  Location: [facility];  Service: Orthopedics;  Laterality: Right;     SOCIAL HISTORY: Social History   Socioeconomic History  . Marital status: Widowed    Spouse name: Not on file  . Number of children: 2  . Years of education: Not on file  . Highest education level: Not on file  Occupational History  . Occupation: Retired    Comment: Community education officer   Tobacco Use  . Smoking status: Never Smoker  . Smokeless tobacco: Never Used  Substance and Sexual Activity  . Alcohol use: No  . Drug use: No  . Sexual activity: Not Currently  Other Topics Concern  . Not on file  Social History Narrative  . Not on file   Social Determinants of Health   Financial Resource Strain:   . Difficulty of Paying Living Expenses:   Food Insecurity:   . Worried About Charity fundraiser in the Last Year:   . Arboriculturist in the Last Year:   Transportation Needs:   .  Lack of Transportation (Medical):   Marland Kitchen Lack of Transportation (Non-Medical):   Physical Activity:   . Days of Exercise per Week:   . Minutes of Exercise per Session:   Stress:   . Feeling of Stress :   Social Connections:   . Frequency of Communication with Friends and Family:   . Frequency of Social Gatherings with Friends and Family:   . Attends Religious Services:   . Active Member of Clubs or Organizations:   . Attends Archivist Meetings:   Marland Kitchen Marital Status:   Intimate Partner Violence:   . Fear of Current or Ex-Partner:   . Emotionally Abused:   Marland Kitchen Physically Abused:   . Sexually Abused:      FAMILY HISTORY: Family History  Problem Relation Age of Onset  . Heart disease Father   . Cancer Maternal Aunt        stomach     ALLERGIES:   is allergic to codeine; latex; and thiazide-type diuretics.   MEDICATIONS:  Current Outpatient Medications  Medication Sig Dispense Refill  . amLODipine (NORVASC) 2.5 MG tablet Take 2.5-5 mg by mouth  See admin instructions. 5 mg in the morning, 2.5 mg in the evening    . apixaban (ELIQUIS) 5 MG TABS tablet Take 1 tablet (5 mg total) by mouth 2 (two) times daily. 180 tablet 0  . Cholecalciferol (VITAMIN D) 50 MCG (2000 UT) tablet Take 2,000 Units by mouth 2 (two) times daily.    . Cyanocobalamin (VITAMIN B-12 PO) Take 3,000 mcg by mouth daily.     Marland Kitchen gabapentin (NEURONTIN) 100 MG capsule Take 2 capsules (200 mg total) by mouth at bedtime. (Patient not taking: Reported on 07/16/2019) 60 capsule 2  . hydrocortisone 2.5 % cream APPLY CREAM TO FACE TWICE DAILY (MORNING AND EVENING )    . levothyroxine (SYNTHROID) 25 MCG tablet Take 25 mcg by mouth daily before breakfast.     . lidocaine-prilocaine (EMLA) cream Apply to affected area once 30 g 3  . LORazepam (ATIVAN) 0.5 MG tablet Take 0.5-1 tablets (0.25-0.5 mg total) by mouth every 8 (eight) hours. 30 tablet 0  . Multiple Vitamins-Minerals (PRESERVISION AREDS PO) Take 1 capsule by mouth 2 (two) times daily.     . ondansetron (ZOFRAN) 4 MG tablet Take 4 mg by mouth 2 (two) times daily as needed.    . ondansetron (ZOFRAN) 8 MG tablet Take 1 tablet (8 mg total) by mouth 2 (two) times daily as needed. Start on the third day after chemotherapy. (Patient not taking: Reported on 09/24/2019) 30 tablet 1  . polyethylene glycol (MIRALAX / GLYCOLAX) 17 g packet Take 17 g by mouth daily.    . prochlorperazine (COMPAZINE) 10 MG tablet Take 1 tablet (10 mg total) by mouth every 6 (six) hours as needed (Nausea or vomiting). (Patient not taking: Reported on 09/24/2019) 30 tablet 1  . SANTYL ointment     . zolpidem (AMBIEN) 10 MG tablet Take 2.5 mg by mouth at bedtime.      No current facility-administered medications for this visit.   Facility-Administered Medications Ordered in Other Visits  Medication Dose Route Frequency Provider Last Rate Last Admin  . brentuximab vedotin (ADCETRIS) 100 mg in sodium chloride 0.9 % 100 mL chemo infusion  1.8 mg/kg (Order-Specific)  Intravenous Once Brunetta Genera, MD      . heparin lock flush 100 unit/mL  500 Units Intracatheter Once PRN Brunetta Genera, MD      . sodium  chloride flush (NS) 0.9 % injection 10 mL  10 mL Intracatheter PRN Brunetta Genera, MD         REVIEW OF SYSTEMS:   A 10+ POINT REVIEW OF SYSTEMS WAS OBTAINED including neurology, dermatology, psychiatry, cardiac, respiratory, lymph, extremities, GI, GU, Musculoskeletal, constitutional, breasts, reproductive, HEENT.  All pertinent positives are noted in the HPI.  All others are negative.   PHYSICAL EXAMINATION: ECOG FS:1 - Symptomatic but completely ambulatory  Vitals:   10/21/19 1147  BP: (!) 142/73  Pulse: 85  Resp: 18  Temp: 97.7 F (36.5 C)  SpO2: 98%   Wt Readings from Last 3 Encounters:  10/21/19 118 lb 9.6 oz (53.8 kg)  09/29/19 115 lb 9.6 oz (52.4 kg)  09/24/19 116 lb 6.4 oz (52.8 kg)   Body mass index is 20.36 kg/m.    GENERAL:alert, in no acute distress and comfortable SKIN: no acute rashes, no significant lesions EYES: conjunctiva are pink and non-injected, sclera anicteric OROPHARYNX: MMM, no exudates, no oropharyngeal erythema or ulceration NECK: supple, no JVD LYMPH:  no palpable lymphadenopathy in the axillary or inguinal regions. Couple palpable lymph nodes in right cervical region.   LUNGS: clear to auscultation b/l with normal respiratory effort HEART: regular rate & rhythm ABDOMEN:  normoactive bowel sounds , non tender, not distended. No palpable hepatosplenomegaly.  Extremity: no pedal edema PSYCH: alert & oriented x 3 with fluent speech NEURO: no focal motor/sensory deficits  LABORATORY DATA:  I have reviewed the data as listed  CBC Latest Ref Rng & Units 10/21/2019 09/29/2019 09/24/2019  WBC 4.0 - 10.5 K/uL 5.1 8.0 8.5  Hemoglobin 12.0 - 15.0 g/dL 13.7 13.4 13.0  Hematocrit 36.0 - 46.0 % 42.7 42.0 40.9  Platelets 150 - 400 K/uL 187 234 203    CMP Latest Ref Rng & Units 10/21/2019 09/29/2019  09/24/2019  Glucose 70 - 99 mg/dL 80 97 157(H)  BUN 8 - 23 mg/dL 17 23 21   Creatinine 0.44 - 1.00 mg/dL 0.72 0.70 0.76  Sodium 135 - 145 mmol/L 141 143 143  Potassium 3.5 - 5.1 mmol/L 4.5 3.8 4.1  Chloride 98 - 111 mmol/L 105 105 106  CO2 22 - 32 mmol/L 25 25 27   Calcium 8.9 - 10.3 mg/dL 9.5 9.4 9.7  Total Protein 6.5 - 8.1 g/dL 6.8 6.9 7.0  Total Bilirubin 0.3 - 1.2 mg/dL 0.5 0.4 0.5  Alkaline Phos 38 - 126 U/L 76 64 67  AST 15 - 41 U/L 13(L) 18 12(L)  ALT 0 - 44 U/L 11 17 11    Component     Latest Ref Rng & Units 03/27/2019  Hepatitis B Surface Ag     NON REACTIVE NON REACTIVE  Hep B Core Total Ab     NON REACTIVE NON REACTIVE  HCV Ab     NON REACTIVE NON REACTIVE  LDH     98 - 192 U/L 252 (H)   . Lab Results  Component Value Date   LDH 216 (H) 10/21/2019      04/08/2019 NM PET Image Initial (PI) Skull Base To Thigh (Accession [id number])  04/04/2019 PATHOLOGY   04/07/2019 ECHOCARDIOGRAM  03/28/2019 PATHOLOGY   03/06/2019 CT ANGIOGRAPHY CHEST WITH CONTRAST (Accession [id number])  04/07/2019 ECHOCARDIOGRAM  03/06/2019 ECHOCARDIOGRAM     03/06/2019 PORTABLE CHEST 1 VIEW (Accession [id number])    02/27/2019 CT CHEST, ABDOMEN, AND PELVIS WITH CONTRAST (Accession [id number])    02/17/2019 CHEST XRAY - 2 VIEW (Accession [id number])    RADIOGRAPHIC STUDIES: I  have personally reviewed the radiological images as listed and agreed with the findings in the report. No results found.   ASSESSMENT & PLAN:   Sarah [name] is a 86 y.o. female with:  1. 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 SKIN, LEFT THIGH, BIOPSY: - Involvement by angioimmunoblastic T-cell lymphoma." -04/07/2019 Echocardiogram - normal EF -04/08/2019 NM PET Image Initial (PI) Skull Base To Thigh (Accession [id number]) revealed "1.  Widespread hypermetabolic adenopathy in the neck, chest, and abdomen/pelvis, primarily in the Deauville 4 and Deauville 5 range. There is also hypermetabolic activity along the posterior nasopharynx in lingual tonsillar regions potentially indicating sites of involvement. 2. The subtle hypodensities in the spine and bony pelvis are not appreciably hypermetabolic may be incidental, surveillance is suggested. 3. Bilateral thyroid activity but especially on the left. Probably from thyroiditis. 4. A 6 mm subpleural nodule anteriorly in the right lower lobe not appreciably hypermetabolic but is below sensitive PET-CT size thresholds and merits surveillance. 5. Mildly accentuated diffuse splenic activity without splenomegaly or focal splenic lesion identified. 6. Other imaging findings of potential clinical significance: Aortic Atherosclerosis (ICD10-I70.0). Coronary atherosclerosis. Large right and small left pleural effusions. Moderate cardiomegaly. Small pericardial effusion. Mild mesenteric edema. Large left renal cyst. Fullness of both renal collecting systems with suspected nonobstructive left nephrolithiasis. Degenerative grade 1 anterolisthesis at L4-5 at L5-S1. Trace pelvic ascites." -05/28/2019 PET/CT ([id number]) revealed "Complete metabolic response to therapy". -09/16/2019 PET/CT ([id number]) revealed "1. Unfortunately evidence of lymphoma recurrence at sites of previous hypermetabolic adenopathy. Hypermetabolic lymphoid tissue is new from comparison PET-CT 05/28/2019 but at same locations as PET-CT 04/08/2019. The number of metastatic lymph nodes is less than 04/08/2019. Lymphoid tissue at the RIGHT skull base is larger. 2. Three foci of hypermetabolic recurrence are present, lymphoid tissue at the RIGHT base of skull, hypermetabolic RIGHT hilar lymph node and hypermetabolic lymph node in the porta hepatis."  2. Pulmonary Embolism -extensive .likely related to extensive malignancy  3. H/o Breast  Cancer  -The patient had bilateral diagnostic  mammography at [facility] 07/11/2011. This  showed some calcifications in the right  breast, which seemed a little bit more  prominent than prior. In the left breast  there was an area of possible  architectural distortion. However left  breast ultrasound the same day showed  no abnormality. 6 month followup was  suggested, and on 02/28/2012 the  patient again had bilateral diagnostic  mammography, now with right  ultrasonography. The microcalcifications  in the right breast appeared increased.  Ultrasound showed a hypoechoic lesion  measuring 5 mm, with no associated  shadowing. This had been previously  noted and appeared unchanged. Anterior  to that there was a small hypoechoic  mass measuring 4 mm in diameter.  There was felt to be suspicious, and on  03/14/2012 the patient underwent biopsy  of the right breast mass, showing ([id number]  [id number]) ductal carcinoma in situ,  intermediate grade, estrogen receptor  100% and progesterone receptor 100%  positive.   -Bilateral breast MRI was obtained  03/26/2012. This showed only post  biopsy changes in the right breast,  associated with an area of non-masslike  enhancement measuring 2.4 cm. The  left breast was unremarkable, and there  was no enlarged axillary or internal  mammary adenopathy noted.  Accordingly on 04/09/2012 the patient  underwent right lumpectomy, the  pathology ([id number]) showing ductal  carcinoma in situ measuring 2.0  cm,  grade 2, with negative margins, the  closest being 0.3 cm. The patient's  subsequent history is as detailed below.   4. Rt breast discomfort on palpable , nipple ulceration and bloody discharge.  PLAN: -Discussed pt labwork today, 10/21/19; blood counts and chemistries look good, Uric acid is WNL, LDH is slightly elevated -Advised pt that higher-dose treatments and long-term treatments have the potential to cause organ failure and disease resistance.  -Advised pt that she may get  a dental cleaning, but will need to take antibiotics beforehand.  -The pt has no prohibitive toxicities from continuing C2 of Brentuximab at this time.  -Will increase dose of Brentuximab from 1.2 mg/m^2 to 1.8 mg/m^2 (standard dose) for C2.  -If pt continues to experience respiratory symptoms will get a Chest XR  -Will refer pt to [facility] see back in 3 weeks with labs, sooner if any new concerns.   FOLLOW-UP: -Please schedule next cycle of Brentuximab-Vedotin in 3 weeks with labs and MD visit -Referral to cancer rehab center   The total time spent in the appt was 30 minutes and more than 50% was on counseling and direct patient cares.  All of the patient's questions were answered with apparent satisfaction. The patient knows to call the clinic with any problems, questions or concerns.    Sullivan Lone MD [place] [facility]AHIV[facility] [place] Hematology/Oncology Physician [facility]  (Office):       [phone] (Work cell):  [phone] (Fax):           [phone]  10/21/2019 1:39 PM  I, Yevette Edwards, am acting as a scribe for Dr. Sullivan Lone.   .I have reviewed the above documentation for accuracy and completeness, and I agree with the above. Brunetta Genera MD

## 2019-10-21 NOTE — Progress Notes (Signed)
Confirmed dose of Adcetris is going to be increased today. Pt did s/w Dr. Irene Limbo about this today.  Kennith Center, Pharm.D., CPP 10/21/2019@12 :56 PM

## 2019-10-22 ENCOUNTER — Ambulatory Visit: Payer: Medicare Other | Attending: Hematology

## 2019-10-22 DIAGNOSIS — G8929 Other chronic pain: Secondary | ICD-10-CM | POA: Insufficient documentation

## 2019-10-22 DIAGNOSIS — M25561 Pain in right knee: Secondary | ICD-10-CM | POA: Diagnosis present

## 2019-10-22 DIAGNOSIS — Z961 Presence of intraocular lens: Secondary | ICD-10-CM | POA: Diagnosis not present

## 2019-10-22 DIAGNOSIS — H16223 Keratoconjunctivitis sicca, not specified as Sjogren's, bilateral: Secondary | ICD-10-CM | POA: Diagnosis not present

## 2019-10-22 DIAGNOSIS — R2689 Other abnormalities of gait and mobility: Secondary | ICD-10-CM | POA: Diagnosis not present

## 2019-10-22 DIAGNOSIS — H11423 Conjunctival edema, bilateral: Secondary | ICD-10-CM | POA: Diagnosis not present

## 2019-10-22 DIAGNOSIS — H18452 Nodular corneal degeneration, left eye: Secondary | ICD-10-CM | POA: Diagnosis not present

## 2019-10-22 DIAGNOSIS — M25562 Pain in left knee: Secondary | ICD-10-CM | POA: Insufficient documentation

## 2019-10-22 DIAGNOSIS — H3554 Dystrophies primarily involving the retinal pigment epithelium: Secondary | ICD-10-CM | POA: Diagnosis not present

## 2019-10-22 DIAGNOSIS — H40013 Open angle with borderline findings, low risk, bilateral: Secondary | ICD-10-CM | POA: Diagnosis not present

## 2019-10-22 NOTE — Therapy (Signed)
[place], [place], [address] Phone: [phone]   Fax:  [phone]  Physical Therapy Evaluation  Patient Details  Name: Sarah [name] MRN: [id number] Date of Birth: Jun 17, 1932 Referring Provider (PT): Sullivan Lone MD   Encounter Date: 10/22/2019  PT End of Session - 10/22/19 1422    Visit Number  1    Number of Visits  17    Date for PT Re-Evaluation  12/24/19    PT Start Time  1306    PT Stop Time  1358    PT Time Calculation (min)  52 min    Activity Tolerance  Patient tolerated treatment well    Behavior During Therapy  [facility] for tasks assessed/performed       Past Medical History:  Diagnosis Date  . Allergy    codeine, thiazides  . Anxiety    new dx  . Arthritis   . Breast cancer ([place]) 03/14/12   bx=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,ER/PR=+,upper inner quad  . Cancer ([place])    breast  . Cataract   . DVT (deep venous thrombosis) ([place]) 02/2019   left leg  . Dyspnea   . Edema    both legs feet and toe, abdomen  . Glaucoma    laser treated years ago  . HOH (hard of hearing)   . Hyperlipemia   . Hypertension   . Hypothyroidism   . Pancreatic cyst    benign  . PONV (postoperative nausea and vomiting)   . Pulmonary embolism ([place]) 02/2019   bilateral   . Radiation 06/11/2012-07/12/2012   17 sessions 4250 cGy, 3 sessions 750 cGy  . Vertigo   . Wears glasses   . Wears partial dentures    partial upper    Past Surgical History:  Procedure Laterality Date  . ABDOMINAL HYSTERECTOMY  1966   1/2 ovary left in   . BREAST SURGERY  1992   lumpectomy-lt  . CATARACT EXTRACTION     b/l  . COLONOSCOPY    . EXCISION MASS NECK Left 03/17/2019    EXCISION MASS NECK (Left Neck)  . EXCISION MASS NECK Left 03/17/2019   Procedure: EXCISION MASS NECK;  Surgeon: Helayne Seminole, MD;  Location: [facility];  Service: ENT;  Laterality: Left;  . EYE SURGERY Bilateral     bilateral cataract removal  . IR IMAGING GUIDED PORT INSERTION  04/23/2019  . JOINT REPLACEMENT  2013   rt total knee  . JOINT REPLACEMENT  1995   lt total knee  . PARTIAL MASTECTOMY WITH NEEDLE LOCALIZATION  04/09/2012   Procedure: PARTIAL MASTECTOMY WITH NEEDLE LOCALIZATION;  Surgeon: Adin Hector, MD;  Location: [place];  Service: General;  Laterality: Right;  . SKIN BIOPSY Left 03/17/2019   LEFT THIGH  . SKIN BIOPSY Left 03/17/2019   Procedure: Skin Biopsy Left Thigh;  Surgeon: Helayne Seminole, MD;  Location: [facility];  Service: ENT;  Laterality: Left;  . TONSILLECTOMY    . TOTAL KNEE ARTHROPLASTY  08/16/2011   Procedure: TOTAL KNEE ARTHROPLASTY;  Surgeon: Ninetta Lights, MD;  Location: [facility];  Service: Orthopedics;  Laterality: Right;    There were no vitals filed for this visit.   Subjective Assessment - 10/22/19 1313    Subjective  Pt states that she has been experiencing difficulty with her balance and with aching in her knees. She states that in the morning they feel okay but start to ache very early  on in the morning. Pt states that she has not had any falls in the past 6 months but she continues to feel unstable on her feet and occasionally has to lean on the door or wall to rest. When she gets fatigued her daughter states that pt does "furniture" walking. She has a cane and possibly a walker but doesn't use those becuase they were her husbands.    Patient is accompained by:  Family member    Pertinent History  Pt is taking antibody treatments for lymphoma. She has finished 2 of 4 rounds. She has previously performed 6 cycles 2 of chmotherapy and 2 of antibodies .When she came out of remission she is no only on antibody treatment cycles, Possible macular degeneration    How long can you sit comfortably?  pts legs are "wobbly" when she goes to stand up.    How long can you stand comfortably?  standing is worse    How long can you walk comfortably?  2 hours     Patient Stated Goals  Improve my balance and help improve my achy knees.    Currently in Pain?  Yes    Pain Score  7     Pain Location  Knee    Pain Orientation  Right;Left    Pain Descriptors / Indicators  Aching    Pain Type  Chronic pain    Pain Onset  More than a month ago    Pain Frequency  Intermittent    Aggravating Factors   standing/walking    Pain Relieving Factors  prolonged rest    Effect of Pain on Daily Activities  Pt has to take rest breaks when standing on her feet.         [facility] PT Assessment - 10/22/19 0001      Assessment   Medical Diagnosis  Lymphoma, balance/weakness     Referring Provider (PT)  Sullivan Lone MD    Hand Dominance  Right    Prior Therapy  Not for this condition.       Precautions   Precautions  Other (comment)    Precaution Comments  Lymphoma, fall risk       Restrictions   Weight Bearing Restrictions  No      Balance Screen   Has the patient fallen in the past 6 months  No    Has the patient had a decrease in activity level because of a fear of falling?   Yes    Is the patient reluctant to leave their home because of a fear of falling?   No      Home Environment   Living Environment  Private residence    Living Arrangements  Alone    Type of [place] to enter    Entrance Stairs-Number of Steps  4    Entrance Stairs-Rails  Cannot reach both    [place]  Two level    Alternate Level Stairs-Number of Steps  13    Alternate Level Stairs-Rails  Right      Prior Function   Level of Independence  Independent    Vocation  Retired    Education officer, environmental    Leisure  herb club, senior club      Cognition   Overall Cognitive Status  Within Functional Limits for tasks assessed      Posture/Postural Control   Posture/Postural Control  Postural limitations    Postural Limitations  Forward head      ROM / Strength   AROM / PROM / Strength  Strength      Standardized Balance Assessment    Standardized Balance Assessment  Five Times Sit to Stand;Timed Up and Go Test    Five times sit to stand comments   7   30 second sit to stand 7x w/intermittent leg support, plinth     Timed Up and Go Test   Normal TUG (seconds)  20                  Objective measurements completed on examination: See above findings.      [facility] Adult PT Treatment/Exercise - 10/22/19 0001      Exercises   Exercises  Knee/Hip      Knee/Hip Exercises: Supine   Straight Leg Raises  Strengthening;Right;Left;2 sets;5 reps    Straight Leg Raises Limitations  slight lag on the R with 3 repetitions, VC for correct alignment inlcuding no higher than the knee, flat back for abdominal activation and dorsiflexion.     Other Supine Knee/Hip Exercises  Isometric hip abduction/adduction for muscle energy technique at home for elevated pelvis on the R compared to the L 10x 10 seconds             PT Education - 10/22/19 1417    Education Details  Access Code: [email or web address], pt and daughter were educated on how to perform exercises discussed length of time it takes for neural re-education vs. muscle hypertrophy. Discussed treatment related to balance including visual activities to improve balance, dynamic activiites and strengthening of the core/pelvis and legs. Discussed alignment issues noted with the pt in the RLE due to internal rotation at the R hip and elevated pelvis in standing that could be effecting pt balance/strength.    Person(s) Educated  Patient;Child(ren)    Methods  Explanation;Demonstration;Verbal cues;Handout    Comprehension  Verbalized understanding;Returned demonstration       PT Short Term Goals - 10/22/19 1441      PT SHORT TERM GOAL #1   Title  Pt will be independent with HEP within 3 weeks in order to demonstrate autonomy of care.    Baseline  pt does not have an HEP    Time  3    Period  Weeks    Status  New    Target Date  11/19/19        PT Long Term Goals -  10/22/19 1456      PT LONG TERM GOAL #1   Title  Pt will improve TUG score to 13.5 seconds or less in order to demonstrate a decreased risk for falls.    Baseline  20 seconds    Time  8    Period  Weeks    Status  New    Target Date  12/24/19      PT LONG TERM GOAL #2   Title  Pt will demonstrate 8x sit to stand w/o use of UE from standard chair height in order to demonstrate improved functional LE strength.    Baseline  7x with intermittent UE use on the legs and on slightly raised plinth table.    Time  8    Period  Weeks    Status  New    Target Date  12/24/19      PT LONG TERM GOAL #3   Title  Pt will report 3/10 pain or less and 50% or greater improvement in funtional mobility  and pain with activity within 8 weeks to demonstrate an overall subjective improvement in quality of life.    Baseline  7/10 pain at the knees.    Time  8    Period  Weeks    Status  New    Target Date  12/24/19             Plan - 10/22/19 1422    Clinical Impression Statement  Pt presents to physical therapy with reports of imbalance and aching pain in her bil knees that increases with activity. Pt demonstrates a high risk for falls as demonstrated by 7 sit to stands in 30 seconds and 20 seconds on the TUG. Pt demonstrates elevated pelvis on the R in standing with internal rotation at the hip causing increased valgus formation at the R knee that is evident in supine as well making the LLE appear longer than the R. Pt may have malalignment at the lumbar spine that is causing malalingment at the hip and LE or vice versa. Pt will benefit from skilled physical therapy services in order to address the above deficits and decrease risk for infjury related to fall and improve functional mobility.    Personal Factors and Comorbidities  Comorbidity 2    Comorbidities  bil knee replacements, lymphoma with on-going treatment (previous chemotherapy currenty antibody therapy)    Stability/Clinical Decision Making   Stable/Uncomplicated    Clinical Decision Making  Low    Rehab Potential  Good    PT Frequency  2x / week    PT Duration  8 weeks    PT Treatment/Interventions  Therapeutic activities;Therapeutic exercise;Neuromuscular re-education;Moist Heat;Manual techniques;Patient/family education    PT Next Visit Plan  work on balance activities, core/pelvic and LE strengthening/endurance.    PT Home Exercise Plan  Access Code: [email or web address]    Recommended Other Services  Live [employer] Program or Silver Sneakers on discharge    Consulted and Agree with Plan of Care  Patient       Patient will benefit from skilled therapeutic intervention in order to improve the following deficits and impairments:  Abnormal gait, Decreased strength, Decreased balance, Pain  Visit Diagnosis: Abnormality of gait due to impairment of balance  Balance disorder  Chronic pain of left knee  Chronic pain of right knee     Problem List Patient Active Problem List   Diagnosis Date Noted  . Port-A-Cath in place 06/04/2019  . 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 Counseling regarding advance care planning and goals of care 04/14/2019  . Lymphadenopathy of head and neck 03/17/2019  . Lymph nodes enlarged 03/17/2019  . History of pulmonary embolus (PE) 03/17/2019  . Acute pulmonary embolism ([place]) 03/06/2019  . Diffuse lymphadenopathy 03/06/2019  . Pleural effusion 03/06/2019  . Pruritus 02/06/2019  . Pruritic rash 02/06/2019  . Angio-edema 02/06/2019  . Shortness of breath 02/06/2019  . Osteopenia 12/18/2013  . Heart block AV first degree 12/18/2013  . Breast cancer of upper-inner quadrant of right female breast ([place]) 03/19/2012  . Right knee DJD 08/18/2011  . Hypothyroidism   . Hypertension   . PONV (postoperative nausea and vomiting)   . Hyperlipemia     Ander Purpura, PT 10/22/2019, 3:03 PM  [place], [place],  [address] Phone: [phone]   Fax:  [phone]  Name: Sarah [name] MRN: [id number] Date of Birth: 1933-05-22

## 2019-10-22 NOTE — Patient Instructions (Signed)
Access Code: [email or web address] URL: [email or web address] Date: 10/22/2019 Prepared by: Tomma Rakers  Exercises Hooklying Isometric Hip Abduction with Belt - 2 x daily - 7 x weekly - 1 sets - 10 reps - 10 seconds hold Supine Pelvic Tilt with Straight Leg Raise - 1 x daily - 7 x weekly - 2 sets - 5 reps

## 2019-10-23 ENCOUNTER — Telehealth: Payer: Self-pay | Admitting: Hematology

## 2019-10-23 NOTE — Telephone Encounter (Signed)
Scheduled per 06/01 los, called and spoke with patient's daughter. Patient will be notified of upcoming appointment.

## 2019-10-28 ENCOUNTER — Telehealth: Payer: Self-pay | Admitting: *Deleted

## 2019-10-28 ENCOUNTER — Ambulatory Visit: Payer: Medicare Other | Admitting: Hematology

## 2019-10-28 ENCOUNTER — Ambulatory Visit: Payer: Medicare Other | Admitting: Physical Therapy

## 2019-10-28 NOTE — Telephone Encounter (Signed)
Daughter [name] called. Mother has following symptoms: Nausea/Cramping/Diarhea w/ some blood seen in stool/abdominal fullness/Decreased appetite/Rash on chest,back,arms,face/Very low energy. Daughter wants to know if these are Side Effects of last treatment or symptoms of lymphoma getting worse. Dr.Kale informed of patient symptoms and question. Per Dr. Irene Limbo - he needs to assess patient and he offered patient appt tomorrow. Contacted daughter- she will bring mother to office tomorrow for appt. Schedule message sent

## 2019-10-28 NOTE — Telephone Encounter (Signed)
Daughter([name]) called - left message: Mother very nauseated and has stomach ache/pain/abdominal pain. Daughter gave her Zofran 8 mg tablet and wants to know if there is anything else she can give her at home. Stool has blood in it, very "slimy".  Dr.Kale informed. Per Dr Irene Limbo - patient's symptoms need evaluation to determine cause and then treatment. If nausea is not relieved by Zofran and pain continues, he recommends patient go to ED for evaluation. Contacted [name] - left VM on named VM with Dr. Grier Mitts recommendation for ED visit.

## 2019-10-29 ENCOUNTER — Ambulatory Visit (INDEPENDENT_AMBULATORY_CARE_PROVIDER_SITE_OTHER): Payer: Medicare Other | Admitting: Critical Care Medicine

## 2019-10-29 ENCOUNTER — Inpatient Hospital Stay (HOSPITAL_BASED_OUTPATIENT_CLINIC_OR_DEPARTMENT_OTHER): Payer: Medicare Other | Admitting: Hematology

## 2019-10-29 ENCOUNTER — Other Ambulatory Visit: Payer: Self-pay

## 2019-10-29 ENCOUNTER — Encounter: Payer: Self-pay | Admitting: Critical Care Medicine

## 2019-10-29 VITALS — BP 110/62 | HR 72 | Temp 97.7°F | Ht 64.0 in | Wt 115.0 lb

## 2019-10-29 VITALS — BP 113/79 | HR 60 | Temp 97.9°F | Resp 18 | Ht 64.0 in | Wt 115.4 lb

## 2019-10-29 DIAGNOSIS — Z86711 Personal history of pulmonary embolism: Secondary | ICD-10-CM | POA: Diagnosis not present

## 2019-10-29 DIAGNOSIS — Z5112 Encounter for antineoplastic immunotherapy: Secondary | ICD-10-CM | POA: Diagnosis not present

## 2019-10-29 DIAGNOSIS — R197 Diarrhea, unspecified: Secondary | ICD-10-CM | POA: Diagnosis not present

## 2019-10-29 DIAGNOSIS — Z7901 Long term (current) use of anticoagulants: Secondary | ICD-10-CM

## 2019-10-29 DIAGNOSIS — C8440 Peripheral T-cell lymphoma, not classified, unspecified site: Secondary | ICD-10-CM

## 2019-10-29 NOTE — Progress Notes (Signed)
HEMATOLOGY/ONCOLOGY CLINIC NOTE  Date of Service: 10/29/2019  Patient Care Team: Lajean Manes, MD as PCP - General (Internal Medicine) Buford Dresser, MD as PCP - Cardiology (Cardiology)  REFERRING PHYSICIAN: Lajean Manes, MD  CHIEF COMPLAINTS/PURPOSE OF CONSULTATION:  Continue mx of Recently Diagnosed Angioimmunoblastic T cell lymphoma      HISTORY OF PRESENTING ILLNESS:   Sarah [name] is a wonderful 86 y.o. female who has been referred to [place] by Lajean Manes, MD for evaluation and management of Possible lymphoma . She is accompanied today by her daughter Sarah [name] . The pt reports that she is doing well overall.   Pending Surgical pathology from 03/17/2019 Dr.Manny will contact clinic about pathology   August 25th/27th she began coughing and sneezing. On Sept 3 went to doctors and they said it was allergies. She began having rashes (little red dots). Went to PCP and they prescribed prednisone and for several days and the medication helped. But as soon as she completed the prescription symptoms reoccurred.   She had a chest X Ray done because she was having a severe cough on 02/17/2019 revealing "bilateral effusions in this patient with history of breast cancer,of uncertain significance with question of right lower lobe pulmonary nodule, consider chest CT for further assessment. Atherosclerotic changes in the thoracic aorta."  Dr. Amy Martinique biopsied the rashes on her back 02/21/2019 and it was determined that it could be because of an allergy to medication. Dr.Stoneking stopped her blood pressure medication from September- October but ther rash still present. The itching started 1 to 2 months ago. The rash would come and go and was mostly on her back. She still has rash on her left thigh.  She is using triaconazole and cetrizine.  She has lumps behind ear and abdomin and she states that they have swollen twice the size within 1 and half months  She has no appetite and  has changes in her taste that began around August. She has changes in bowels. They are more loose and smaller In size. She also has no control over bowels.  She feels fatigued and it has increased since August.   She has a cough that starts in September  that has made her horse.   She had a CT scan on 02/27/2019 because she was having severe cough and her stomach was very extended. Revealing "1. Extensive lymphadenopathy throughout the chest, abdomen, and pelvis, with index lymph nodes identified above. Splenomegaly. Findings are most consistent with lymphoma with recurrent, metastatic breast malignancy less favored. 2. There is generally osteopenic appearance of the skeleton. There are numerous subtle hypodense lesions, particularly of the pelvis (e.g. Series 2, image 88) and select vertebral bodies, for example L4 (series 8, image 92). This is suspicious although not definitive for osseous metastatic disease. Characterization for metabolic activity by nuclear scintigraphic bone scan or PET-CT would be helpful to further evaluate. 3. Moderate right, small left pleural effusions with associated atelectasis or consolidation. Subpleural radiation fibrosis of the anterior right lung. 4. There is a new subpleural pulmonary nodule of the anterior right middle lobe measuring 6 mm (series 4, image 105), nonspecific. There is no obvious pleural thickening or nodularity definitive for metastatic disease. 5.  Small volume ascites. 6. There is a fluid attenuation lesion of the pancreatic uncinate measuring 2.4 x 1.3 cm (series 2, image 63), not substantially changed when compared to remote prior MR examination dated 02/23/2012 and likely sequelae of a prior pseudocyst versus incidental pancreatic IPMN. 7.  Cardiomegaly."  Hospitalized from 03/06/2019-03/08/2019. She presenting with acute shortness of breath, leg swelling,left hand swelling and some weight gain despite poor appetite. In ED, CTA chest revealed at least  submassive PE, moderate right pleural effusion, diffuse LAD concerning for lymphoma. Started on heparin drip. PCCM consulted for guidance. She had thoracocentesis with removal of 1200 cc cloudy exudative pleural fluid. Cardiology consulted for elevated which was thought to be demand ischemia from right heart strain in the setting of PE. Patient was transitioned to subcu Lovenox by pulmonology the next day and remained stable. Unusual appearance of tracheal air column at the thoracic inlet. Correlate with any history of swallowing difficulty or changes in voice with dedicated imaging with chest CT and or CT of the neck as indicated.  She has a nonhealing ulcer on her right tibialis anterior that starting weeping one week ago. Of note since the patient's last visit, pt has had CHEST XRAY - 2 VIEW (Accession [id number]) completed on 02/17/2019 with results revealing "Bilateral effusions in this patient with history of breast cancer,of uncertain significance with question of right lower lobepulmonary nodule, consider chest CT for furtherassessment.Unusual appearance of tracheal air column at the thoracic inlet.Correlate with any history of swallowing difficulty or changes in voice with dedicated imaging with chest CT and or CT of the neck as Indicated. Atherosclerotic changes in the thoracic aorta."  Of note since the patient's last visit, pt has had CT CHEST, ABDOMEN, AND PELVIS WITH CONTRAST (Accession [id number]) completed on 02/27/2019 with results revealing "1. Extensive lymphadenopathy throughout the chest, abdomen, and pelvis, with index lymph nodes identified above. Splenomegaly.Findings are most consistent with lymphoma with recurrent, metastatic breast malignancy less favored. 2. There is generally osteopenic appearance of the skeleton. There are numerous subtle hypodense lesions, particularly of the pelvis (e.g. Series 2, image 88) and select vertebral bodies, for example L4 (series 8, image 92).  This is suspicious although not definitive for osseous metastatic disease. Characterization for metabolic activity by nuclear scintigraphic bone scan or PET-CT would be helpful to further evaluate.  3. Moderate right, small left pleural effusions with associated atelectasis or consolidation. Subpleural radiation fibrosis of the anterior right lung.4. There is a new subpleural pulmonary nodule of the anterior right middle lobe measuring 6 mm (series 4, image 105), nonspecific. There is no obvious pleural thickening or nodularity definitive for metastatic disease.5.  Small volume ascites. 6. There is a fluid attenuation lesion of the pancreatic uncinate measuring 2.4 x 1.3 cm (series 2, image 63), not substantially changed when compared to remote prior MR examination dated 02/23/2012 and likely sequelae of a prior pseudocyst versus incidental pancreatic IPMN.7.  Cardiomegaly.8. Other chronic and incidental findings as detailed above. Aortic Atherosclerosis (ICD10-I70.0)."  Of note since the patient's last visit, pt has had PORTABLE CHEST 1 VIEW (Accession [id number]) completed on 03/06/2019 with results revealing "1. Moderate size right and small left pleural effusions appear not significantly changed since 02/27/19. 2. No new cardiopulmonary abnormality."  Of note since the patient's last visit, pt has had ECHOCARDIOGRAM  completed on 03/06/2019 with results revealing  "1. Left ventricular ejection fraction, by visual estimation, is 60 to 65%. The left ventricle has normal function. Normal left ventricular size. There is no left ventricular hypertrophy. 2. Left ventricular diastolic Doppler parameters are consistent with impaired relaxation pattern of LV diastolic filling. 3. Global right ventricle has mildly reduced systolic function.The right ventricular size is moderately enlarged. No increase in right ventricular wall thickness. 4. Left atrial size was normal.  5. Right atrial size was mildly dilated.  6. Trivial pericardial effusion is present. 7. The mitral valve is normal in structure. Trace mitral valve regurgitation. No evidence of mitral stenosis. 8. The tricuspid valve is normal in structure. Tricuspid valve regurgitation severe. 9. The aortic valve is normal in structure. Aortic valve regurgitation was not visualized by color flow Doppler. Structurally normal aortic valve, with no evidence of sclerosis or stenosis.10. The pulmonic valve was normal in structure. Pulmonic valve regurgitation is mild by color flow Doppler.11. Mildly elevated pulmonary artery systolic pressure.12. The inferior vena cava is normal in size with greater than 50% respiratory variability, suggesting right atrial pressure of 3 mmHg."  Of note since the patient's last visit, pt has had CT ANGIOGRAPHY CHEST WITH CONTRAST (Accession [id number]) completed on 03/06/2019 with results revealing "1. Pulmonary embolus arising from the distal left main pulmonary artery with extension into multiple left lower lobe pulmonary arterial branches. There is also incompletely obstructing pulmonary embolus in the proximal left upper lobe pulmonary artery. More distal pulmonary embolus in the right lower lobe pulmonary artery. Positive for acute PE with CT evidence of right heart strain (RV/LV Ratio = 1.6) consistent with at least submassive (intermediate risk) PE. The presence of right heart strain has been associated with an increased risk of morbidity and mortality. Please activate Code PE by paging [phone]. 2. Sizable pleural effusion on the right with smaller pleural effusion on the left. Consolidation and compressive atelectasis throughout most of the right lower lobe. Milder atelectasis left base. Nodular opacities on the right, likely metastatic foci.3.  Stable adenopathy compared to 1 week prior.4. Enlargement of spleen, incompletely visualized but documented CT 1 week prior. Concern for potential lymphoma. 5. Aortic atherosclerosis.  No aneurysm or dissection evident. Foci of coronary artery calcification noted."  Most recent lab results (03/06/2019) of CBC is as follows: all values are WNL except for Platelets at 72, Eosinophils Absolute at 0.6,Abs Immature Granulocytes at 0.08 .  On review of systems, pt reports rashes, digestive issues and denies back pain, abdominal pain and any other symptoms.   On PMHx the pt reports Hypothyroidism, MCI, Hypercholesterolemia, Tracheal anomaly, Hypertension, Pulmonary nodule, Atherosclerosis of aorta, anxiety, gait disorder, primary insomnia.   INTERVAL HISTORY:  Sarah [name] is a 86 y.o. female here for evaluation and management of Angioimmunoblastic T-cell lymphoma. We are joined today by her daughter, Sarah [name]. The patient's last visit with [place] was on 10/21/2019. The pt reports that she is doing well overall.  The pt reports that she was having nausea and abdominal cramping yesterday. She took 4 mg Zofran, which did not improve her symptoms, then took an 8 mg Zofran, which helped. Pt has felt fatigue and lack of appetite since restarting treatment. Pt typically eats three times a day, but eats very small portions. Her rash is stable and continues to itch.   For the last three weeks pt has had slime-like bowel movements intermittently. She also noticed blood on the tissue after wiping, but denies any black or bloody stools.   On review of systems, pt reports fatigue, rash, skin itching, low appetite, rectal bleeding and denies fevers, chills, night sweats, dyspepsia, mouth sores, vomiting, bloody/black stools, throat soreness, sleeplessness and any other symptoms.    MEDICAL HISTORY:  Past Medical History:  Diagnosis Date  . Allergy    codeine, thiazides  . Anxiety    new dx  . Arthritis   . Breast cancer ([place]) 03/14/12   bx=right breast=Ductal carcinoma in  situ w/calcifications,ER/PR=+,upper inner quad  . Cancer ([place])    breast  . Cataract   . DVT (deep venous thrombosis)  ([place]) 02/2019   left leg  . Dyspnea   . Edema    both legs feet and toe, abdomen  . Glaucoma    laser treated years ago  . HOH (hard of hearing)   . Hyperlipemia   . Hypertension   . Hypothyroidism   . Pancreatic cyst    benign  . PONV (postoperative nausea and vomiting)   . Pulmonary embolism ([place]) 02/2019   bilateral   . Radiation 06/11/2012-07/12/2012   17 sessions 4250 cGy, 3 sessions 750 cGy  . Vertigo   . Wears glasses   . Wears partial dentures    partial upper     SURGICAL HISTORY: Past Surgical History:  Procedure Laterality Date  . ABDOMINAL HYSTERECTOMY  1966   1/2 ovary left in   . BREAST SURGERY  1992   lumpectomy-lt  . CATARACT EXTRACTION     b/l  . COLONOSCOPY    . EXCISION MASS NECK Left 03/17/2019    EXCISION MASS NECK (Left Neck)  . EXCISION MASS NECK Left 03/17/2019   Procedure: EXCISION MASS NECK;  Surgeon: Helayne Seminole, MD;  Location: [facility];  Service: ENT;  Laterality: Left;  . EYE SURGERY Bilateral    bilateral cataract removal  . IR IMAGING GUIDED PORT INSERTION  04/23/2019  . JOINT REPLACEMENT  2013   rt total knee  . JOINT REPLACEMENT  1995   lt total knee  . PARTIAL MASTECTOMY WITH NEEDLE LOCALIZATION  04/09/2012   Procedure: PARTIAL MASTECTOMY WITH NEEDLE LOCALIZATION;  Surgeon: Adin Hector, MD;  Location: [facility];  Service: General;  Laterality: Right;  . SKIN BIOPSY Left 03/17/2019   LEFT THIGH  . SKIN BIOPSY Left 03/17/2019   Procedure: Skin Biopsy Left Thigh;  Surgeon: Helayne Seminole, MD;  Location: [facility];  Service: ENT;  Laterality: Left;  . TONSILLECTOMY    . TOTAL KNEE ARTHROPLASTY  08/16/2011   Procedure: TOTAL KNEE ARTHROPLASTY;  Surgeon: Ninetta Lights, MD;  Location: [facility];  Service: Orthopedics;  Laterality: Right;     SOCIAL HISTORY: Social History   Socioeconomic History  . Marital status: Widowed    Spouse name: Not on file  . Number of children: 2  . Years of education: Not  on file  . Highest education level: Not on file  Occupational History  . Occupation: Retired    Comment: Community education officer   Tobacco Use  . Smoking status: Never Smoker  . Smokeless tobacco: Never Used  Substance and Sexual Activity  . Alcohol use: No  . Drug use: No  . Sexual activity: Not Currently  Other Topics Concern  . Not on file  Social History Narrative  . Not on file   Social Determinants of Health   Financial Resource Strain:   . Difficulty of Paying Living Expenses:   Food Insecurity:   . Worried About Charity fundraiser in the Last Year:   . Arboriculturist in the Last Year:   Transportation Needs:   . Film/video editor (Medical):   Marland Kitchen Lack of Transportation (Non-Medical):   Physical Activity:   . Days of Exercise per Week:   . Minutes of Exercise per Session:   Stress:   . Feeling of Stress :   Social Connections:   . Frequency of Communication with Friends and  Family:   . Frequency of Social Gatherings with Friends and Family:   . Attends Religious Services:   . Active Member of Clubs or Organizations:   . Attends Archivist Meetings:   Marland Kitchen Marital Status:   Intimate Partner Violence:   . Fear of Current or Ex-Partner:   . Emotionally Abused:   Marland Kitchen Physically Abused:   . Sexually Abused:      FAMILY HISTORY: Family History  Problem Relation Age of Onset  . Heart disease Father   . Cancer Maternal Aunt        stomach     ALLERGIES:   is allergic to codeine; latex; and thiazide-type diuretics.   MEDICATIONS:  Current Outpatient Medications  Medication Sig Dispense Refill  . amLODipine (NORVASC) 2.5 MG tablet Take 2.5-5 mg by mouth See admin instructions. 5 mg in the morning, 2.5 mg in the evening    . apixaban (ELIQUIS) 5 MG TABS tablet Take 1 tablet (5 mg total) by mouth 2 (two) times daily. 180 tablet 0  . Cholecalciferol (VITAMIN D) 50 MCG (2000 UT) tablet Take 2,000 Units by mouth 2 (two) times daily.    . Cyanocobalamin  (VITAMIN B-12 PO) Take 3,000 mcg by mouth daily.     Marland Kitchen gabapentin (NEURONTIN) 100 MG capsule Take 2 capsules (200 mg total) by mouth at bedtime. (Patient not taking: Reported on 07/16/2019) 60 capsule 2  . hydrocortisone 2.5 % cream APPLY CREAM TO FACE TWICE DAILY (MORNING AND EVENING )    . levothyroxine (SYNTHROID) 25 MCG tablet Take 25 mcg by mouth daily before breakfast.     . lidocaine-prilocaine (EMLA) cream Apply to affected area once 30 g 3  . LORazepam (ATIVAN) 0.5 MG tablet Take 0.5-1 tablets (0.25-0.5 mg total) by mouth every 8 (eight) hours. (Patient not taking: Reported on 10/22/2019) 30 tablet 0  . Multiple Vitamins-Minerals (PRESERVISION AREDS PO) Take 1 capsule by mouth 2 (two) times daily.     . ondansetron (ZOFRAN) 4 MG tablet Take 4 mg by mouth 2 (two) times daily as needed.    . ondansetron (ZOFRAN) 8 MG tablet Take 1 tablet (8 mg total) by mouth 2 (two) times daily as needed. Start on the third day after chemotherapy. (Patient not taking: Reported on 09/24/2019) 30 tablet 1  . polyethylene glycol (MIRALAX / GLYCOLAX) 17 g packet Take 17 g by mouth daily.    . prochlorperazine (COMPAZINE) 10 MG tablet Take 1 tablet (10 mg total) by mouth every 6 (six) hours as needed (Nausea or vomiting). (Patient not taking: Reported on 09/24/2019) 30 tablet 1  . zolpidem (AMBIEN) 10 MG tablet Take 2.5 mg by mouth at bedtime.      No current facility-administered medications for this visit.     REVIEW OF SYSTEMS:   A 10+ POINT REVIEW OF SYSTEMS WAS OBTAINED including neurology, dermatology, psychiatry, cardiac, respiratory, lymph, extremities, GI, GU, Musculoskeletal, constitutional, breasts, reproductive, HEENT.  All pertinent positives are noted in the HPI.  All others are negative.   PHYSICAL EXAMINATION: ECOG FS:1 - Symptomatic but completely ambulatory  Vitals:   10/29/19 1225  BP: 113/79  Pulse: 60  Resp: 18  Temp: 97.9 F (36.6 C)  SpO2: 96%   Wt Readings from Last 3 Encounters:    10/29/19 115 lb 6.4 oz (52.3 kg)  10/29/19 115 lb (52.2 kg)  10/21/19 118 lb 9.6 oz (53.8 kg)   Body mass index is 19.81 kg/m.    Exam was given in  a chair   GENERAL:alert, in no acute distress and comfortable SKIN: no acute rashes, no significant lesions EYES: conjunctiva are pink and non-injected, sclera anicteric OROPHARYNX: MMM, no exudates, no oropharyngeal erythema or ulceration NECK: supple, no JVD LYMPH:  no palpable lymphadenopathy in the cervical, axillary or inguinal regions LUNGS: clear to auscultation b/l with normal respiratory effort HEART: regular rate & rhythm ABDOMEN:  normoactive bowel sounds , non tender, not distended. No palpable hepatosplenomegaly.  Extremity: no pedal edema PSYCH: alert & oriented x 3 with fluent speech NEURO: no focal motor/sensory deficits  LABORATORY DATA:  I have reviewed the data as listed  CBC Latest Ref Rng & Units 10/21/2019 09/29/2019 09/24/2019  WBC 4.0 - 10.5 K/uL 5.1 8.0 8.5  Hemoglobin 12.0 - 15.0 g/dL 13.7 13.4 13.0  Hematocrit 36.0 - 46.0 % 42.7 42.0 40.9  Platelets 150 - 400 K/uL 187 234 203    CMP Latest Ref Rng & Units 10/21/2019 09/29/2019 09/24/2019  Glucose 70 - 99 mg/dL 80 97 157(H)  BUN 8 - 23 mg/dL 17 23 21   Creatinine 0.44 - 1.00 mg/dL 0.72 0.70 0.76  Sodium 135 - 145 mmol/L 141 143 143  Potassium 3.5 - 5.1 mmol/L 4.5 3.8 4.1  Chloride 98 - 111 mmol/L 105 105 106  CO2 22 - 32 mmol/L 25 25 27   Calcium 8.9 - 10.3 mg/dL 9.5 9.4 9.7  Total Protein 6.5 - 8.1 g/dL 6.8 6.9 7.0  Total Bilirubin 0.3 - 1.2 mg/dL 0.5 0.4 0.5  Alkaline Phos 38 - 126 U/L 76 64 67  AST 15 - 41 U/L 13(L) 18 12(L)  ALT 0 - 44 U/L 11 17 11    Component     Latest Ref Rng & Units 03/27/2019  Hepatitis B Surface Ag     NON REACTIVE NON REACTIVE  Hep B Core Total Ab     NON REACTIVE NON REACTIVE  HCV Ab     NON REACTIVE NON REACTIVE  LDH     98 - 192 U/L 252 (H)   . Lab Results  Component Value Date   LDH 216 (H) 10/21/2019       04/08/2019 NM PET Image Initial (PI) Skull Base To Thigh (Accession [id number])  04/04/2019 PATHOLOGY   04/07/2019 ECHOCARDIOGRAM  03/28/2019 PATHOLOGY   03/06/2019 CT ANGIOGRAPHY CHEST WITH CONTRAST (Accession [id number])  04/07/2019 ECHOCARDIOGRAM  03/06/2019 ECHOCARDIOGRAM     03/06/2019 PORTABLE CHEST 1 VIEW (Accession [id number])    02/27/2019 CT CHEST, ABDOMEN, AND PELVIS WITH CONTRAST (Accession [id number])    02/17/2019 CHEST XRAY - 2 VIEW (Accession [id number])    RADIOGRAPHIC STUDIES: I have personally reviewed the radiological images as listed and agreed with the findings in the report. No results found.   ASSESSMENT & PLAN:   Sarah [name] is a 86 y.o. female with:  1. 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 SKIN, LEFT THIGH, BIOPSY: - Involvement by angioimmunoblastic T-cell lymphoma." -04/07/2019 Echocardiogram - normal EF -04/08/2019 NM PET Image Initial (PI) Skull Base To Thigh (Accession [id number]) revealed "1. Widespread hypermetabolic adenopathy in the neck, chest, and abdomen/pelvis, primarily in the Deauville 4 and Deauville 5 range. There is also hypermetabolic activity along the posterior nasopharynx in lingual tonsillar regions potentially indicating sites of involvement. 2. The subtle hypodensities in the spine and bony pelvis are not appreciably hypermetabolic may be incidental, surveillance is suggested. 3. Bilateral thyroid activity but especially  on the left. Probably from thyroiditis. 4. A 6 mm subpleural nodule anteriorly in the right lower lobe not appreciably hypermetabolic but is below sensitive PET-CT size thresholds and merits surveillance. 5. Mildly accentuated diffuse splenic activity without splenomegaly or focal splenic lesion identified. 6. Other imaging findings of  potential clinical significance: Aortic Atherosclerosis (ICD10-I70.0). Coronary atherosclerosis. Large right and small left pleural effusions. Moderate cardiomegaly. Small pericardial effusion. Mild mesenteric edema. Large left renal cyst. Fullness of both renal collecting systems with suspected nonobstructive left nephrolithiasis. Degenerative grade 1 anterolisthesis at L4-5 at L5-S1. Trace pelvic ascites." -05/28/2019 PET/CT ([id number]) revealed "Complete metabolic response to therapy". -09/16/2019 PET/CT ([id number]) revealed "1. Unfortunately evidence of lymphoma recurrence at sites of previous hypermetabolic adenopathy. Hypermetabolic lymphoid tissue is new from comparison PET-CT 05/28/2019 but at same locations as PET-CT 04/08/2019. The number of metastatic lymph nodes is less than 04/08/2019. Lymphoid tissue at the RIGHT skull base is larger. 2. Three foci of hypermetabolic recurrence are present, lymphoid tissue at the RIGHT base of skull, hypermetabolic RIGHT hilar lymph node and hypermetabolic lymph node in the porta hepatis."  2. Pulmonary Embolism -extensive .likely related to extensive malignancy  3. H/o Breast Cancer  -The patient had bilateral diagnostic  mammography at [facility] 07/11/2011. This  showed some calcifications in the right  breast, which seemed a little bit more  prominent than prior. In the left breast  there was an area of possible  architectural distortion. However left  breast ultrasound the same day showed  no abnormality. 6 month followup was  suggested, and on 02/28/2012 the  patient again had bilateral diagnostic  mammography, now with right  ultrasonography. The microcalcifications  in the right breast appeared increased.  Ultrasound showed a hypoechoic lesion  measuring 5 mm, with no associated  shadowing. This had been previously  noted and appeared unchanged. Anterior  to that there was a small hypoechoic  mass measuring 4 mm in diameter.  There was felt to be suspicious,  and on  03/14/2012 the patient underwent biopsy  of the right breast mass, showing ([id number]  [id number]) ductal carcinoma in situ,  intermediate grade, estrogen receptor  100% and progesterone receptor 100%  positive.   -Bilateral breast MRI was obtained  03/26/2012. This showed only post  biopsy changes in the right breast,  associated with an area of non-masslike  enhancement measuring 2.4 cm. The  left breast was unremarkable, and there  was no enlarged axillary or internal  mammary adenopathy noted.  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 The patient's  subsequent history is as detailed below.   4. Rt breast discomfort on palpable , nipple ulceration and bloody discharge.  PLAN: -Advised pt that her rash is caused by lymphoma and may fluctuate based on disease progression/regression -Advised pt that we can only continue treatment as long as pt is able to sustain herself nutritionally  -Advised pt that GI symptoms could be from treatment, but could also be caused by a bacterial infection -Recommend pt take OTC Powdered Probiotics  -Will get stool sample testing for r/o bacterial infection, such as C. diff -Will see back in 2 weeks with next cycle, sooner if new concerns    FOLLOW-UP: Labs today for stool studies Plz schedule next cycle of Brentuximab-Vedotin rx as ordered with labs and MD visit on 11/11/2019  The total time spent in the appt was 30 minutes and more than 50% was on counseling and direct patient cares.  All of the patient's questions were answered with apparent satisfaction. The patient knows to call the clinic with any problems, questions or concerns.    Sullivan Lone MD [place] [facility] Hematology/Oncology [facility]  (Office):       [phone] (Work cell):   [phone] (Fax):           [phone]  10/29/2019 1:30 PM  I, Yevette Edwards, am acting as a scribe for Dr. Sullivan Lone.   .I have reviewed the above documentation for accuracy and completeness, and I agree with the above. Brunetta Genera MD     ADDENDUM   Component     Latest Ref Rng & Units 10/30/2019  Campylobacter species     NOT DETECTED NOT DETECTED  Plesimonas shigelloides     NOT DETECTED NOT DETECTED  Salmonella species     NOT DETECTED NOT DETECTED  Yersinia enterocolitica     NOT DETECTED NOT DETECTED  Vibrio species     NOT DETECTED NOT DETECTED  Vibrio cholerae     NOT DETECTED NOT DETECTED  Enteroaggregative E coli (EAEC)     NOT DETECTED NOT DETECTED  Enteropathogenic E coli (EPEC)     NOT DETECTED DETECTED (A)  Enterotoxigenic E coli (ETEC)     NOT DETECTED NOT DETECTED  Shiga like toxin producing E coli (STEC)     NOT DETECTED NOT DETECTED  Shigella/Enteroinvasive E coli (EIEC)     NOT DETECTED NOT DETECTED  Cryptosporidium     NOT DETECTED NOT DETECTED  Cyclospora cayetanensis     NOT DETECTED NOT DETECTED  Entamoeba histolytica     NOT DETECTED NOT DETECTED  Giardia lamblia     NOT DETECTED NOT DETECTED  Adenovirus F40/41     NOT DETECTED NOT DETECTED  Astrovirus     NOT DETECTED NOT DETECTED  Norovirus GI/GII     NOT DETECTED NOT DETECTED  Rotavirus A     NOT DETECTED NOT DETECTED  Sapovirus (I, II, IV, and V)     NOT DETECTED NOT DETECTED   Plan Ciprofloxacin 500mg  po BID for 5 days

## 2019-10-29 NOTE — Progress Notes (Signed)
Synopsis: Referred in October 2020 for pleural effusion by Lajean Manes, MD.  Subjective:   PATIENT ID: Sarah [name] GENDER: female DOB: 1933/03/13, MRN: [id number]  Chief Complaint  Patient presents with  . Follow-up    6 mth f/u, dry cough, pt states doing well    Sarah [name] is an 86 year old woman with a history of submassive provoked PE in  associated with angioblastic T-cell lymphoma.  She is accompanied by her daughter at today's visit.  She continues to undergo treatment under Dr. Grier Mitts care.  She worse GI side effects yesterday, but otherwise has been tolerating her treatment relatively well.  She has been loose stools, nausea, and minimal blood in her stools yesterday.  No other significant bleeding, bleeding not necessarily increasing.  She previously had a malignant pleural effusion which resolved on prednisone prior to starting chemotherapy.  She has not required management since initial thoracentesis in October 2020.  She remains on Eliquis twice daily she has an appointment this afternoon with Dr. Irene Limbo.  Recent 10/21/2019 oncology note reviewed--  PET scan in April demonstrated active lymphoma with progression in multiple lymph nodes compared to her previous PET scan and her initial PET scan.  Last week she received cycle 2 of brentuximab at an increased dose.  Remains functional with ECOG functional class I.  At her last oncology visit she was referred to the cancer rehabilitation center.  Her next immunotherapy injection is around June 22.  She denies shortness of breath and other pulmonary symptoms.  She continues to have a rash intermittently that is bothersome associated with her lymphoma.    OV 04/25/19: Sarah [name] is an 86 year old woman with a history of angioimmunoblastic T-cell lymphoma, MPE, and submassive PE who presents for follow-up. She had her port placed earlier this week and started chemo yesterday. IR was unable to place a pleurX due to lack of  available pleural fluid.  She was on high-dose prednisone for several days prior to starting chemo.  She denies shortness of breath, coughing, or other complaints.  The last 2 nights her itching has improved to the point that she is able to sleep through the night.  She tolerated her first chemotherapy yesterday well.  She is back on her Eliquis with no bleeding or bruising complications from her port placement.  OV 11/23: Sarah [name] is an 86 year old woman with a history of angioimmunoblastic T-cell lymphoma, MPE, and submassive PE who presents for follow-up.  She met with oncology last week, and is tentatively supposed to get a port and start chemotherapy after that.  She continues to complain of her most significant symptom being itching that prevents sleep.  She continues to take Zyrtec, prednisone, hydroxyzine, without relief.  She stopped taking Pepcid because it did not work.  She has been doing well on anticoagulation.  She denies shortness of breath, chest pain.  She has been losing weight.  Her daughter had to buy her new close.  Her appetite has been poor and she is on early satiety.  No significant nausea.  She wants to know if she can stop taking Breo-no history of asthma or tobacco abuse.  No significant bleeding on anticoagulation.  Sarah [name] was accompanied by her daughter again at today's visit.   OV 03/13/2019: Sarah [name] is an 86 year old woman who was referred for follow-up of a pleural effusion and a submassive PE.  She is accompanied by [name].  She was evaluated in the emergency department last week  after presenting with acute onset of shortness of breath and heart racing where she was found to have a large right-sided pleural effusion, small left pleural effusion, and submassive PE.  She was started on Lovenox in the hospital.  She underwent drainage of her right pleural effusion, without a significant change in her symptoms.  Since August she has had significant  pruritus with a less noticeable rash, dry cough, abdominal distention, edema in her left arm and both legs, and reduced appetite.  She had undergone CT scanning with her primary care provider, who had noted adenopathy.  She is planning for an excisional biopsy with ENT next week.  Her cough has been bothersome, intermittent, sometimes mild, but episodically very severe and refractory.  She has not had sputum or hemoptysis.  During the same timeframe she has noticed pruritus that is severe enough to keep her awake at night and a rash.  She has tried cetirizine, which did not help her symptoms.  Before the PE, she did not have shortness of breath associated with her cough.  Since her hospitalization she has had reduced exercise tolerance, but before August was very active with no activity limitations.  She has not required oxygen since discharge from the hospital.  She has no personal history of VTE or miscarriages.  Her mother had hemophilia, but there have been no clotting disorders in the family.  She has not had any recent surgeries, extensive travel, significant injuries, or immobility to explain her DVT and PE.  She has a history of breast cancer in 2013, status post lumpectomy with radiation.  She never had chemotherapy.  Past Medical History:  Diagnosis Date  . Allergy    codeine, thiazides  . Anxiety    new dx  . Arthritis   . Breast cancer ([place]) 03/14/12   bx=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,ER/PR=+,upper inner quad  . Cancer ([place])    breast  . Cataract   . DVT (deep venous thrombosis) ([place]) 02/2019   left leg  . Dyspnea   . Edema    both legs feet and toe, abdomen  . Glaucoma    laser treated years ago  . HOH (hard of hearing)   . Hyperlipemia   . Hypertension   . Hypothyroidism   . Pancreatic cyst    benign  . PONV (postoperative nausea and vomiting)   . Pulmonary embolism ([place]) 02/2019   bilateral   . Radiation 06/11/2012-07/12/2012   17 sessions 4250  cGy, 3 sessions 750 cGy  . Vertigo   . Wears glasses   . Wears partial dentures    partial upper     Family History  Problem Relation Age of Onset  . Heart disease Father   . Cancer Maternal Aunt        stomach     Past Surgical History:  Procedure Laterality Date  . ABDOMINAL HYSTERECTOMY  1966   1/2 ovary left in   . BREAST SURGERY  1992   lumpectomy-lt  . CATARACT EXTRACTION     b/l  . COLONOSCOPY    . EXCISION MASS NECK Left 03/17/2019    EXCISION MASS NECK (Left Neck)  . EXCISION MASS NECK Left 03/17/2019   Procedure: EXCISION MASS NECK;  Surgeon: Helayne Seminole, MD;  Location: [place];  Service: ENT;  Laterality: Left;  . EYE SURGERY Bilateral    bilateral cataract removal  . IR IMAGING GUIDED PORT INSERTION  04/23/2019  . JOINT REPLACEMENT  2013  rt total knee  . JOINT REPLACEMENT  1995   lt total knee  . PARTIAL MASTECTOMY WITH NEEDLE LOCALIZATION  04/09/2012   Procedure: PARTIAL MASTECTOMY WITH NEEDLE LOCALIZATION;  Surgeon: Adin Hector, MD;  Location: [place];  Service: General;  Laterality: Right;  . SKIN BIOPSY Left 03/17/2019   LEFT THIGH  . SKIN BIOPSY Left 03/17/2019   Procedure: Skin Biopsy Left Thigh;  Surgeon: Helayne Seminole, MD;  Location: [place];  Service: ENT;  Laterality: Left;  . TONSILLECTOMY    . TOTAL KNEE ARTHROPLASTY  08/16/2011   Procedure: TOTAL KNEE ARTHROPLASTY;  Surgeon: Ninetta Lights, MD;  Location: [place];  Service: Orthopedics;  Laterality: Right;    Social History   Socioeconomic History  . Marital status: Widowed    Spouse name: Not on file  . Number of children: 2  . Years of education: Not on file  . Highest education level: Not on file  Occupational History  . Occupation: Retired    Comment: Community education officer   Tobacco Use  . Smoking status: Never Smoker  . Smokeless tobacco: Never Used  Substance and Sexual Activity  . Alcohol use: No  . Drug use: No  . Sexual activity: Not Currently    Other Topics Concern  . Not on file  Social History Narrative  . Not on file   Social Determinants of Health   Financial Resource Strain:   . Difficulty of Paying Living Expenses:   Food Insecurity:   . Worried About Charity fundraiser in the Last Year:   . Arboriculturist in the Last Year:   Transportation Needs:   . Film/video editor (Medical):   Marland Kitchen Lack of Transportation (Non-Medical):   Physical Activity:   . Days of Exercise per Week:   . Minutes of Exercise per Session:   Stress:   . Feeling of Stress :   Social Connections:   . Frequency of Communication with Friends and Family:   . Frequency of Social Gatherings with Friends and Family:   . Attends Religious Services:   . Active Member of Clubs or Organizations:   . Attends Archivist Meetings:   Marland Kitchen Marital Status:   Intimate Partner Violence:   . Fear of Current or Ex-Partner:   . Emotionally Abused:   Marland Kitchen Physically Abused:   . Sexually Abused:      Allergies  Allergen Reactions  . Codeine Other (See Comments)    Reaction not recalled  . Latex   . Thiazide-Type Diuretics Other (See Comments)    Blurry vision?? (patient stated she has macular degeneration)     Immunization History  Administered Date(s) Administered  . Influenza, High Dose Seasonal PF 02/24/2017, 02/24/2019    Outpatient Medications Prior to Visit  Medication Sig Dispense Refill  . amLODipine (NORVASC) 2.5 MG tablet Take 2.5-5 mg by mouth See admin instructions. 5 mg in the morning, 2.5 mg in the evening    . apixaban (ELIQUIS) 5 MG TABS tablet Take 1 tablet (5 mg total) by mouth 2 (two) times daily. 180 tablet 0  . Cholecalciferol (VITAMIN D) 50 MCG (2000 UT) tablet Take 2,000 Units by mouth 2 (two) times daily.    . Cyanocobalamin (VITAMIN B-12 PO) Take 3,000 mcg by mouth daily.     Marland Kitchen gabapentin (NEURONTIN) 100 MG capsule Take 2 capsules (200 mg total) by mouth at bedtime. (Patient not taking: Reported on 07/16/2019) 60  capsule 2  .  hydrocortisone 2.5 % cream APPLY CREAM TO FACE TWICE DAILY (MORNING AND EVENING )    . levothyroxine (SYNTHROID) 25 MCG tablet Take 25 mcg by mouth daily before breakfast.     . lidocaine-prilocaine (EMLA) cream Apply to affected area once 30 g 3  . LORazepam (ATIVAN) 0.5 MG tablet Take 0.5-1 tablets (0.25-0.5 mg total) by mouth every 8 (eight) hours. (Patient not taking: Reported on 10/22/2019) 30 tablet 0  . Multiple Vitamins-Minerals (PRESERVISION AREDS PO) Take 1 capsule by mouth 2 (two) times daily.     . ondansetron (ZOFRAN) 4 MG tablet Take 4 mg by mouth 2 (two) times daily as needed.    . ondansetron (ZOFRAN) 8 MG tablet Take 1 tablet (8 mg total) by mouth 2 (two) times daily as needed. Start on the third day after chemotherapy. (Patient not taking: Reported on 09/24/2019) 30 tablet 1  . polyethylene glycol (MIRALAX / GLYCOLAX) 17 g packet Take 17 g by mouth daily.    . prochlorperazine (COMPAZINE) 10 MG tablet Take 1 tablet (10 mg total) by mouth every 6 (six) hours as needed (Nausea or vomiting). (Patient not taking: Reported on 09/24/2019) 30 tablet 1  . zolpidem (AMBIEN) 10 MG tablet Take 2.5 mg by mouth at bedtime.     Marland Kitchen SANTYL ointment      No facility-administered medications prior to visit.    Review of Systems  Constitutional: Positive for chills and malaise/fatigue. Negative for diaphoresis, fever and weight loss.  HENT: Positive for congestion. Negative for ear pain and sore throat.   Eyes:       Right subconjunctival hemorrhage  Respiratory: Positive for cough. Negative for hemoptysis, sputum production, shortness of breath and wheezing.        Cough improved  Cardiovascular: Positive for leg swelling. Negative for chest pain and palpitations.  Gastrointestinal: Positive for nausea. Negative for abdominal pain and heartburn.       Reduced appetite  Genitourinary: Negative for frequency.  Musculoskeletal: Negative for joint pain and myalgias.  Skin: Positive for  itching and rash.  Neurological: Positive for weakness. Negative for dizziness and headaches.  Endo/Heme/Allergies: Bruises/bleeds easily.  Psychiatric/Behavioral: Negative for depression. The patient is not nervous/anxious.      Objective:   Vitals:   10/29/19 1142  BP: 110/62  Pulse: 72  Temp: 97.7 F (36.5 C)  TempSrc: Oral  SpO2: 98%  Weight: 115 lb (52.2 kg)  Height: _0  (1.626 m)   98% on  RA BMI Readings from Last 3 Encounters:  10/29/19 19.74 kg/m  10/21/19 20.36 kg/m  09/29/19 19.84 kg/m   Wt Readings from Last 3 Encounters:  10/29/19 115 lb (52.2 kg)  10/21/19 118 lb 9.6 oz (53.8 kg)  09/29/19 115 lb 9.6 oz (52.4 kg)    Physical Exam Vitals reviewed.  Constitutional:      General: She is not in acute distress. HENT:     Head: Normocephalic and atraumatic.  Eyes:     General: No scleral icterus. Cardiovascular:     Rate and Rhythm: Normal rate and regular rhythm.     Heart sounds: No murmur.  Pulmonary:     Comments: Breathing comfortably on room air, no conversational dyspnea.  Clear to auscultation bilaterally. Abdominal:     General: There is no distension.     Palpations: Abdomen is soft.  Musculoskeletal:        General: No swelling or deformity.     Cervical back: Neck supple.  Lymphadenopathy:  Cervical: No cervical adenopathy.  Skin:    General: Skin is warm and dry.     Findings: No rash.  Neurological:     General: No focal deficit present.     Mental Status: She is alert.     Coordination: Coordination normal.  Psychiatric:        Mood and Affect: Mood normal.        Behavior: Behavior normal.      CBC    Component Value Date/Time   WBC 5.1 10/21/2019 1115   WBC 8.5 09/24/2019 1345   RBC 4.79 10/21/2019 1115   HGB 13.7 10/21/2019 1115   HGB 15.2 07/25/2016 1053   HCT 42.7 10/21/2019 1115   HCT 44.9 07/25/2016 1053   PLT 187 10/21/2019 1115   PLT 176 07/25/2016 1053   MCV 89.1 10/21/2019 1115   MCV 88.5  07/25/2016 1053   MCH 28.6 10/21/2019 1115   MCHC 32.1 10/21/2019 1115   RDW 14.3 10/21/2019 1115   RDW 13.7 07/25/2016 1053   LYMPHSABS 0.4 (L) 10/21/2019 1115   LYMPHSABS 0.9 07/25/2016 1053   MONOABS 0.6 10/21/2019 1115   MONOABS 0.4 07/25/2016 1053   EOSABS 0.3 10/21/2019 1115   EOSABS 0.1 07/25/2016 1053   BASOSABS 0.0 10/21/2019 1115   BASOSABS 0.1 07/25/2016 1053    CHEMISTRY No results for input(s): NA, K, CL, CO2, GLUCOSE, BUN, CREATININE, CALCIUM, MG, PHOS in the last 168 hours.   Pleural fluid Cell count with differential-825 WBCs, 51% PMNs, 44% lymphs, 4% monocytes, 1% eosinophils Protein 3.5 (Serum 6.0) pH 7.9 LDH 107 (Serum 211) Glucose 107 Culture- NG Flow cytometry not performed as fluid appeared more inflammatory than like lymphoma per Path  RF <10 Anti-CCP 7 ANA negative IgE 406  Chest Imaging- films reviewed: CTA chest 03/06/2019- PE in the left lower lobe and right PA extending into the lower and upper lobe segmental pulmonary arteries.  Moderate size right dependent pleural effusion with overlying compressive atelectasis and smaller left dependent pleural effusion.  Minimal mediastinal adenopathy, multiple subcentimeter axillary nodes bilaterally.  Subpleural anterior opacity on the right, mild groundglass opacities in the right middle lobe.  CXR 03/06/2019-minimal residual right pleural effusion, small left dependent pleural effusion.  Full re-expansion of the right lung post thoracentesis.  No obvious pulmonary masses or opacities.  Previous right breast surgical clips.  CXR, 2 view 03/13/2019-increasing right dependent pleural effusion, stable left pleural effusion  CT PET 04/08/2019-moderate right dependent pleural effusion, PET avid adenopathy in neck, chest, axilla, abdomen, and pelvis.  CXR, 2-view 04/25/2019-minimal residual right pleural fluid.  Right-sided Port-A-Cath in place.  NM PET 09/16/2019-No recurrent pleural effusion.  Stable  peripheral round groundglass nodules in right lower and right middle lobes. PET avid 10 R and 11 R nodules.  PET avid abdominal lymph node near the porta hepatis.  Minimally PET avid left inguinal lymph node.  Increased uptake around left glenohumeral joint.  Strongly PET avid tissue in the right posterior sinuses/prevertebral space.  Pulmonary Functions Testing Results: No flowsheet data found.  Pathology:  Pleural fluid 03/06/2019-no malignant cells, but inflammatory cells present  Cervical & left thigh lymph nodes 03/17/2019: Angioimmunoblastic T-cell lymphoma  Echocardiogram 03/06/2019: LVEF 60 to 17%, diastolic dysfunction.  Moderately enlarged RV with mildly reduced systolic function.  Mildly dilated RA, normal LA.  Trivial pericardial effusion.  Trace MR, severe TR.  Mildly elevated PASP.  LE ultrasound 03/07/2019- LLE DVT and posterior tibial veins, gastrocnemius veins.  Enlarged groin lymph nodes.  Assessment & Plan:     ICD-10-CM   1. Chronic anticoagulation  Z79.01   2. History of pulmonary embolus (PE)  Z86.711     History of provoked submassive PE, DVT in LLE -provoked by lymphoma.  Resolution of right heart strain on follow-up echo. -Continue Eliquis 5 mg twice daily for the duration of her malignancy. -If she develops increased bleeding beyond just a few drops intermittently with diarrhea or any other significant bleeding she should discontinue Eliquis immediately and let us know.  Malignant pleural effusion, history of stage 3/4 non-Hodgkin's lymphoma (angioimmunoblasticblastic T cell)-resolved with prednisone and her first round of chemotherapy.  No evidence of recurrence on most recent PET scan in April 2021 -Continue to monitor -Follow-up with Dr. Irene Limbo today about immunotherapy side effects   RTC in 6 months.      Current Outpatient Medications:  .  amLODipine (NORVASC) 2.5 MG tablet, Take 2.5-5 mg by mouth See admin instructions. 5 mg in the morning, 2.5 mg  in the evening, Disp: , Rfl:  .  apixaban (ELIQUIS) 5 MG TABS tablet, Take 1 tablet (5 mg total) by mouth 2 (two) times daily., Disp: 180 tablet, Rfl: 0 .  Cholecalciferol (VITAMIN D) 50 MCG (2000 UT) tablet, Take 2,000 Units by mouth 2 (two) times daily., Disp: , Rfl:  .  Cyanocobalamin (VITAMIN B-12 PO), Take 3,000 mcg by mouth daily. , Disp: , Rfl:  .  gabapentin (NEURONTIN) 100 MG capsule, Take 2 capsules (200 mg total) by mouth at bedtime. (Patient not taking: Reported on 07/16/2019), Disp: 60 capsule, Rfl: 2 .  hydrocortisone 2.5 % cream, APPLY CREAM TO FACE TWICE DAILY (MORNING AND EVENING ), Disp: , Rfl:  .  levothyroxine (SYNTHROID) 25 MCG tablet, Take 25 mcg by mouth daily before breakfast. , Disp: , Rfl:  .  lidocaine-prilocaine (EMLA) cream, Apply to affected area once, Disp: 30 g, Rfl: 3 .  LORazepam (ATIVAN) 0.5 MG tablet, Take 0.5-1 tablets (0.25-0.5 mg total) by mouth every 8 (eight) hours. (Patient not taking: Reported on 10/22/2019), Disp: 30 tablet, Rfl: 0 .  Multiple Vitamins-Minerals (PRESERVISION AREDS PO), Take 1 capsule by mouth 2 (two) times daily. , Disp: , Rfl:  .  ondansetron (ZOFRAN) 4 MG tablet, Take 4 mg by mouth 2 (two) times daily as needed., Disp: , Rfl:  .  ondansetron (ZOFRAN) 8 MG tablet, Take 1 tablet (8 mg total) by mouth 2 (two) times daily as needed. Start on the third day after chemotherapy. (Patient not taking: Reported on 09/24/2019), Disp: 30 tablet, Rfl: 1 .  polyethylene glycol (MIRALAX / GLYCOLAX) 17 g packet, Take 17 g by mouth daily., Disp: , Rfl:  .  prochlorperazine (COMPAZINE) 10 MG tablet, Take 1 tablet (10 mg total) by mouth every 6 (six) hours as needed (Nausea or vomiting). (Patient not taking: Reported on 09/24/2019), Disp: 30 tablet, Rfl: 1 .  zolpidem (AMBIEN) 10 MG tablet, Take 2.5 mg by mouth at bedtime. , Disp: , Rfl:    Julian Hy, DO [facility] 10/29/2019 11:45 AM

## 2019-10-29 NOTE — Patient Instructions (Addendum)
Thank you for visiting Dr. Carlis Abbott at [facility]. We recommend the following:  Stay on Eliquis 2 times daily unless significant bleeding issues, in which case it should be stopped. Please let us know if this occurs.  Return in about 6 months (around 04/29/2020).    Please do your part to reduce the spread of COVID-19.

## 2019-10-30 ENCOUNTER — Other Ambulatory Visit: Payer: Self-pay | Admitting: *Deleted

## 2019-10-30 ENCOUNTER — Inpatient Hospital Stay: Payer: Medicare Other

## 2019-10-30 DIAGNOSIS — R197 Diarrhea, unspecified: Secondary | ICD-10-CM

## 2019-10-30 DIAGNOSIS — Z5112 Encounter for antineoplastic immunotherapy: Secondary | ICD-10-CM | POA: Diagnosis not present

## 2019-10-31 ENCOUNTER — Other Ambulatory Visit: Payer: Self-pay | Admitting: Hematology & Oncology

## 2019-10-31 LAB — GASTROINTESTINAL PANEL BY PCR, STOOL (REPLACES STOOL CULTURE)

## 2019-10-31 MED ORDER — CIPROFLOXACIN HCL 500 MG PO TABS
500.0000 mg | ORAL_TABLET | Freq: Two times a day (BID) | ORAL | 0 refills | Status: DC
Start: 2019-10-31 — End: 2019-12-31

## 2019-11-03 ENCOUNTER — Other Ambulatory Visit: Payer: Self-pay

## 2019-11-03 ENCOUNTER — Ambulatory Visit: Payer: Medicare Other | Attending: Hematology

## 2019-11-03 DIAGNOSIS — R2689 Other abnormalities of gait and mobility: Secondary | ICD-10-CM | POA: Insufficient documentation

## 2019-11-03 DIAGNOSIS — M25562 Pain in left knee: Secondary | ICD-10-CM | POA: Diagnosis present

## 2019-11-03 DIAGNOSIS — M25561 Pain in right knee: Secondary | ICD-10-CM | POA: Diagnosis present

## 2019-11-03 DIAGNOSIS — G8929 Other chronic pain: Secondary | ICD-10-CM | POA: Insufficient documentation

## 2019-11-03 NOTE — Therapy (Signed)
[facility] [address] [place], [place], [address] Phone: [phone]   Fax:  [phone]  Physical Therapy Treatment  Patient Details  Name: Sarah [name] MRN: [id number] Date of Birth: November 13, 1932 Referring Provider (PT): Sullivan Lone MD   Encounter Date: 11/03/2019   PT End of Session - 11/03/19 1527    Visit Number 2    Date for PT Re-Evaluation 12/24/19    Authorization Type Medicare/BCBS    PT Start Time 1447    PT Stop Time 1528    PT Time Calculation (min) 41 min    Activity Tolerance Patient tolerated treatment well    Behavior During Therapy [facility] for tasks assessed/performed           Past Medical History:  Diagnosis Date  . Allergy    codeine, thiazides  . Anxiety    new dx  . Arthritis   . Breast cancer ([place]) 03/14/12   bx=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,ER/PR=+,upper inner quad  . Cancer ([place])    breast  . Cataract   . DVT (deep venous thrombosis) ([place]) 02/2019   left leg  . Dyspnea   . Edema    both legs feet and toe, abdomen  . Glaucoma    laser treated years ago  . HOH (hard of hearing)   . Hyperlipemia   . Hypertension   . Hypothyroidism   . Pancreatic cyst    benign  . PONV (postoperative nausea and vomiting)   . Pulmonary embolism ([place]) 02/2019   bilateral   . Radiation 06/11/2012-07/12/2012   17 sessions 4250 cGy, 3 sessions 750 cGy  . Vertigo   . Wears glasses   . Wears partial dentures    partial upper    Past Surgical History:  Procedure Laterality Date  . ABDOMINAL HYSTERECTOMY  1966   1/2 ovary left in   . BREAST SURGERY  1992   lumpectomy-lt  . CATARACT EXTRACTION     b/l  . COLONOSCOPY    . EXCISION MASS NECK Left 03/17/2019    EXCISION MASS NECK (Left Neck)  . EXCISION MASS NECK Left 03/17/2019   Procedure: EXCISION MASS NECK;  Surgeon: Helayne Seminole, MD;  Location: [facility];  Service: ENT;  Laterality: Left;  . EYE  SURGERY Bilateral    bilateral cataract removal  . IR IMAGING GUIDED PORT INSERTION  04/23/2019  . JOINT REPLACEMENT  2013   rt total knee  . JOINT REPLACEMENT  1995   lt total knee  . PARTIAL MASTECTOMY WITH NEEDLE LOCALIZATION  04/09/2012   Procedure: PARTIAL MASTECTOMY WITH NEEDLE LOCALIZATION;  Surgeon: Adin Hector, MD;  Location: [facility];  Service: General;  Laterality: Right;  . SKIN BIOPSY Left 03/17/2019   LEFT THIGH  . SKIN BIOPSY Left 03/17/2019   Procedure: Skin Biopsy Left Thigh;  Surgeon: Helayne Seminole, MD;  Location: [facility];  Service: ENT;  Laterality: Left;  . TONSILLECTOMY    . TOTAL KNEE ARTHROPLASTY  08/16/2011   Procedure: TOTAL KNEE ARTHROPLASTY;  Surgeon: Ninetta Lights, MD;  Location: [facility];  Service: Orthopedics;  Laterality: Right;    There were no vitals filed for this visit.   Subjective Assessment - 11/03/19 1450    Subjective I haven't been doing my exercises at home.    Currently in Pain? No/denies  [place] Adult PT Treatment/Exercise - 11/03/19 0001      Knee/Hip Exercises: Aerobic   Nustep Level 1x 8 minutes- PT present to discuss progress      Knee/Hip Exercises: Standing   Heel Raises Both;2 sets;10 reps    Rocker Board 3 minutes    Other Standing Knee Exercises weight shifting on balance pad x1.5 minutes      Knee/Hip Exercises: Seated   Long Arc Quad Strengthening;Both;2 sets;10 reps    [employer] x20    Sit to [employer] 2 sets;10 reps;Other (comment)   from black pad                   PT Short Term Goals - 10/22/19 1441      PT SHORT TERM GOAL #1   Title Pt will be independent with HEP within 3 weeks in order to demonstrate autonomy of care.    Baseline pt does not have an HEP    Time 3    Period Weeks    Status New    Target Date 11/19/19             PT Long Term Goals - 10/22/19 1456      PT LONG TERM GOAL #1   Title Pt will improve TUG score  to 13.5 seconds or less in order to demonstrate a decreased risk for falls.    Baseline 20 seconds    Time 8    Period Weeks    Status New    Target Date 12/24/19      PT LONG TERM GOAL #2   Title Pt will demonstrate 8x sit to stand w/o use of UE from standard chair height in order to demonstrate improved functional LE strength.    Baseline 7x with intermittent UE use on the legs and on slightly raised plinth table.    Time 8    Period Weeks    Status New    Target Date 12/24/19      PT LONG TERM GOAL #3   Title Pt will report 3/10 pain or less and 50% or greater improvement in funtional mobility and pain with activity within 8 weeks to demonstrate an overall subjective improvement in quality of life.    Baseline 7/10 pain at the knees.    Time 8    Period Weeks    Status New    Target Date 12/24/19                 Plan - 11/03/19 1519    Clinical Impression Statement Pt with first time follow-up after evaluation.  Pt has not been consistent with HEP issued at initial evaluation.  PT educated pt on importance of compliance with HEP for strength and balance improvements.  Pt with uncontrolled descent with stand to sit transition and this improved with verbal and elevation of surface.  Pt required close stand by assistance for safety with exercise and for verbal cueing for technique.  Pt will continue to benefit from skilled PT to address balance and strength deficits.    Comorbidities bil knee replacements, lymphoma with on-going treatment (previous chemotherapy currenty antibody therapy)    Rehab Potential Good    PT Frequency 2x / week    PT Duration 8 weeks    PT Treatment/Interventions Therapeutic activities;Therapeutic exercise;Neuromuscular re-education;Moist Heat;Manual techniques;Patient/family education    PT Next Visit Plan work on balance activities, core/pelvic and LE strengthening/endurance.    PT Home Exercise Plan Access Code: [email or web address]  Consulted and Agree  with Plan of Care Patient           Patient will benefit from skilled therapeutic intervention in order to improve the following deficits and impairments:  Abnormal gait, Decreased strength, Decreased balance, Pain  Visit Diagnosis: Abnormality of gait due to impairment of balance  Balance disorder  Chronic pain of left knee  Chronic pain of right knee     Problem List Patient Active Problem List   Diagnosis Date Noted  . Port-A-Cath in place 06/04/2019  . 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[place]) 04/14/2019  . Counseling regarding advance care planning and goals of care 04/14/2019  . Lymphadenopathy of head and neck 03/17/2019  . Lymph nodes enlarged 03/17/2019  . History of pulmonary embolus (PE) 03/17/2019  . Acute pulmonary embolism ([place]) 03/06/2019  . Diffuse lymphadenopathy 03/06/2019  . Pleural effusion 03/06/2019  . Pruritus 02/06/2019  . Pruritic rash 02/06/2019  . Angio-edema 02/06/2019  . Shortness of breath 02/06/2019  . Osteopenia 12/18/2013  . Heart block AV first degree 12/18/2013  . Breast cancer of upper-inner quadrant of right female breast ([place]) 03/19/2012  . Right knee DJD 08/18/2011  . Hypothyroidism   . Hypertension   . PONV (postoperative nausea and vomiting)   . Hyperlipemia     Sigurd Sos, PT 11/03/19 3:29 PM  [facility] [address] [place], [place], [address] Phone: [phone]   Fax:  [phone]  Name: ELLIONA DODDRIDGE MRN: [id number] Date of Birth: 06/01/1932

## 2019-11-10 ENCOUNTER — Other Ambulatory Visit: Payer: Self-pay

## 2019-11-10 ENCOUNTER — Inpatient Hospital Stay (HOSPITAL_BASED_OUTPATIENT_CLINIC_OR_DEPARTMENT_OTHER): Payer: Medicare Other | Admitting: Hematology

## 2019-11-10 ENCOUNTER — Inpatient Hospital Stay: Payer: Medicare Other

## 2019-11-10 ENCOUNTER — Ambulatory Visit: Payer: Medicare Other

## 2019-11-10 VITALS — BP 134/79 | HR 72 | Temp 97.9°F | Resp 18 | Ht 64.0 in | Wt 117.2 lb

## 2019-11-10 DIAGNOSIS — G8929 Other chronic pain: Secondary | ICD-10-CM

## 2019-11-10 DIAGNOSIS — Z5112 Encounter for antineoplastic immunotherapy: Secondary | ICD-10-CM | POA: Diagnosis not present

## 2019-11-10 DIAGNOSIS — R197 Diarrhea, unspecified: Secondary | ICD-10-CM | POA: Diagnosis not present

## 2019-11-10 DIAGNOSIS — C8440 Peripheral T-cell lymphoma, not classified, unspecified site: Secondary | ICD-10-CM | POA: Diagnosis not present

## 2019-11-10 DIAGNOSIS — Z5111 Encounter for antineoplastic chemotherapy: Secondary | ICD-10-CM | POA: Diagnosis not present

## 2019-11-10 DIAGNOSIS — R2689 Other abnormalities of gait and mobility: Secondary | ICD-10-CM | POA: Diagnosis not present

## 2019-11-10 DIAGNOSIS — Z95828 Presence of other vascular implants and grafts: Secondary | ICD-10-CM

## 2019-11-10 DIAGNOSIS — M25562 Pain in left knee: Secondary | ICD-10-CM

## 2019-11-10 LAB — CMP (CANCER CENTER ONLY)
ALT: 13 U/L (ref 0–44)
AST: 14 U/L — ABNORMAL LOW (ref 15–41)
Albumin: 3.6 g/dL (ref 3.5–5.0)
Alkaline Phosphatase: 77 U/L (ref 38–126)
Anion gap: 10 (ref 5–15)
BUN: 22 mg/dL (ref 8–23)
CO2: 26 mmol/L (ref 22–32)
Calcium: 9.5 mg/dL (ref 8.9–10.3)
Chloride: 107 mmol/L (ref 98–111)
Creatinine: 0.74 mg/dL (ref 0.44–1.00)
GFR, Est AFR Am: >60 mL/min (ref >60)
GFR, Estimated: >60 mL/min (ref >60)
Glucose, Bld: 118 mg/dL — ABNORMAL HIGH (ref 70–99)
Potassium: 3.8 mmol/L (ref 3.5–5.1)
Sodium: 143 mmol/L (ref 135–145)
Total Bilirubin: 0.4 mg/dL (ref 0.3–1.2)
Total Protein: 6.4 g/dL — ABNORMAL LOW (ref 6.5–8.1)

## 2019-11-10 LAB — CBC WITH DIFFERENTIAL/PLATELET
Abs Immature Granulocytes: 0.01 10*3/uL (ref 0.00–0.07)
Basophils Absolute: 0.1 10*3/uL (ref 0.0–0.1)
Basophils Relative: 1 %
Eosinophils Absolute: 0.3 10*3/uL (ref 0.0–0.5)
Eosinophils Relative: 6 %
HCT: 38 % (ref 36.0–46.0)
Hemoglobin: 12.3 g/dL (ref 12.0–15.0)
Immature Granulocytes: 0 %
Lymphocytes Relative: 17 %
Lymphs Abs: 0.8 10*3/uL (ref 0.7–4.0)
MCH: 28.6 pg (ref 26.0–34.0)
MCHC: 32.4 g/dL (ref 30.0–36.0)
MCV: 88.4 fL (ref 80.0–100.0)
Monocytes Absolute: 0.5 10*3/uL (ref 0.1–1.0)
Monocytes Relative: 12 %
Neutro Abs: 3 10*3/uL (ref 1.7–7.7)
Neutrophils Relative %: 64 %
Platelets: 191 10*3/uL (ref 150–400)
RBC: 4.3 MIL/uL (ref 3.87–5.11)
RDW: 15.2 % (ref 11.5–15.5)
WBC: 4.6 10*3/uL (ref 4.0–10.5)
nRBC: 0 % (ref 0.0–0.2)

## 2019-11-10 LAB — LACTATE DEHYDROGENASE: LDH: 214 U/L — ABNORMAL HIGH (ref 98–192)

## 2019-11-10 MED ORDER — SODIUM CHLORIDE 0.9% FLUSH
10.0000 mL | Freq: Once | INTRAVENOUS | Status: AC
  Administered 2019-11-10: 10 mL
  Filled 2019-11-10: qty 10

## 2019-11-10 NOTE — Therapy (Signed)
[facility] [address], [place], [place], [address] Phone: [phone]   Fax:  [phone]  Physical Therapy Treatment  Patient Details  Name: Sarah [name] MRN: [id number] Date of Birth: Aug 21, 1932 Referring Provider (PT): Sullivan Lone MD   Encounter Date: 11/10/2019   PT End of Session - 11/10/19 1221    Visit Number 3    Date for PT Re-Evaluation 12/24/19    Authorization Type Medicare/BCBS    PT Start Time 1145    PT Stop Time 1225    PT Time Calculation (min) 40 min    Activity Tolerance Patient tolerated treatment well    Behavior During Therapy [facility] for tasks assessed/performed           Past Medical History:  Diagnosis Date  . Allergy    codeine, thiazides  . Anxiety    new dx  . Arthritis   . Breast cancer ([place]) 03/14/12   bx=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,ER/PR=+,upper inner quad  . Cancer ([place])    breast  . Cataract   . DVT (deep venous thrombosis) ([place]) 02/2019   left leg  . Dyspnea   . Edema    both legs feet and toe, abdomen  . Glaucoma    laser treated years ago  . HOH (hard of hearing)   . Hyperlipemia   . Hypertension   . Hypothyroidism   . Pancreatic cyst    benign  . PONV (postoperative nausea and vomiting)   . Pulmonary embolism ([place]) 02/2019   bilateral   . Radiation 06/11/2012-07/12/2012   17 sessions 4250 cGy, 3 sessions 750 cGy  . Vertigo   . Wears glasses   . Wears partial dentures    partial upper    Past Surgical History:  Procedure Laterality Date  . ABDOMINAL HYSTERECTOMY  1966   1/2 ovary left in   . BREAST SURGERY  1992   lumpectomy-lt  . CATARACT EXTRACTION     b/l  . COLONOSCOPY    . EXCISION MASS NECK Left 03/17/2019    EXCISION MASS NECK (Left Neck)  . EXCISION MASS NECK Left 03/17/2019   Procedure: EXCISION MASS NECK;  Surgeon: Helayne Seminole, MD;  Location: [facility];  Service: ENT;  Laterality: Left;  . EYE  SURGERY Bilateral    bilateral cataract removal  . IR IMAGING GUIDED PORT INSERTION  04/23/2019  . JOINT REPLACEMENT  2013   rt total knee  . JOINT REPLACEMENT  1995   lt total knee  . PARTIAL MASTECTOMY WITH NEEDLE LOCALIZATION  04/09/2012   Procedure: PARTIAL MASTECTOMY WITH NEEDLE LOCALIZATION;  Surgeon: Adin Hector, MD;  Location: [facility];  Service: General;  Laterality: Right;  . SKIN BIOPSY Left 03/17/2019   LEFT THIGH  . SKIN BIOPSY Left 03/17/2019   Procedure: Skin Biopsy Left Thigh;  Surgeon: Helayne Seminole, MD;  Location: [facility];  Service: ENT;  Laterality: Left;  . TONSILLECTOMY    . TOTAL KNEE ARTHROPLASTY  08/16/2011   Procedure: TOTAL KNEE ARTHROPLASTY;  Surgeon: Ninetta Lights, MD;  Location: [facility];  Service: Orthopedics;  Laterality: Right;    There were no vitals filed for this visit.                      [facility] Adult PT Treatment/Exercise - 11/10/19 0001      High Level Balance   High Level Balance Comments tandem stance 2x 10  seconds bil each      Exercises   Exercises Shoulder      Knee/Hip Exercises: Aerobic   Nustep Level 2 x 6 minutes- PT present to discuss progress   fatigue of legs and stopped early     Knee/Hip Exercises: Standing   Heel Raises Both;2 sets;10 reps    Rocker Board 3 minutes    Other Standing Knee Exercises weight shifting on balance pad x1.5 minutes, tandem stance without upper extremity support 3x 20 seconds with close stand by assitance by PT      Knee/Hip Exercises: Seated   Long Arc Quad Strengthening;Both;2 sets;10 reps    [employer] x20    Marching Strengthening;20 reps    Sit to [employer] 2 sets;10 reps;Other (comment)   from black pad     Shoulder Exercises: Seated   Row Strengthening;20 reps;Theraband    Theraband Level (Shoulder Row) Level 2 (Red)                    PT Short Term Goals - 10/22/19 1441      PT SHORT TERM GOAL #1   Title Pt will be independent with  HEP within 3 weeks in order to demonstrate autonomy of care.    Baseline pt does not have an HEP    Time 3    Period Weeks    Status New    Target Date 11/19/19             PT Long Term Goals - 10/22/19 1456      PT LONG TERM GOAL #1   Title Pt will improve TUG score to 13.5 seconds or less in order to demonstrate a decreased risk for falls.    Baseline 20 seconds    Time 8    Period Weeks    Status New    Target Date 12/24/19      PT LONG TERM GOAL #2   Title Pt will demonstrate 8x sit to stand w/o use of UE from standard chair height in order to demonstrate improved functional LE strength.    Baseline 7x with intermittent UE use on the legs and on slightly raised plinth table.    Time 8    Period Weeks    Status New    Target Date 12/24/19      PT LONG TERM GOAL #3   Title Pt will report 3/10 pain or less and 50% or greater improvement in funtional mobility and pain with activity within 8 weeks to demonstrate an overall subjective improvement in quality of life.    Baseline 7/10 pain at the knees.    Time 8    Period Weeks    Status New    Target Date 12/24/19                 Plan - 11/10/19 1205    Clinical Impression Statement Pt remains challenged by current level of exercise/activity in the clinic.  Pt experienced fatigue with NuStep today. Pt is now performing her HEP 2x/day for strength and endurance gains.   Pt required close stand by assistance for safety with exercise and for verbal cueing for technique.   Pt will continue to benefit from skilled PT to address balance and strength deficits.    Rehab Potential Good    PT Frequency 2x / week    PT Duration 8 weeks    PT Treatment/Interventions Therapeutic activities;Therapeutic exercise;Neuromuscular re-education;Moist Heat;Manual techniques;Patient/family education    PT  Next Visit Plan work on balance activities, core/pelvic and LE strengthening/endurance.    PT Home Exercise Plan Access Code: [email or web address]     Recommended Other Services initial cert is signed    Consulted and Agree with Plan of Care Patient           Patient will benefit from skilled therapeutic intervention in order to improve the following deficits and impairments:  Abnormal gait, Decreased strength, Decreased balance, Pain  Visit Diagnosis: Abnormality of gait due to impairment of balance  Balance disorder  Chronic pain of left knee  Chronic pain of right knee     Problem List Patient Active Problem List   Diagnosis Date Noted  . Port-A-Cath in place 06/04/2019  . 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[place]) 04/14/2019  . Counseling regarding advance care planning and goals of care 04/14/2019  . Lymphadenopathy of head and neck 03/17/2019  . Lymph nodes enlarged 03/17/2019  . History of pulmonary embolus (PE) 03/17/2019  . Acute pulmonary embolism ([place]) 03/06/2019  . Diffuse lymphadenopathy 03/06/2019  . Pleural effusion 03/06/2019  . Pruritus 02/06/2019  . Pruritic rash 02/06/2019  . Angio-edema 02/06/2019  . Shortness of breath 02/06/2019  . Osteopenia 12/18/2013  . Heart block AV first degree 12/18/2013  . Breast cancer of upper-inner quadrant of right female breast ([place]) 03/19/2012  . Right knee DJD 08/18/2011  . Hypothyroidism   . Hypertension   . PONV (postoperative nausea and vomiting)   . Hyperlipemia     Sigurd Sos, PT 11/10/19 12:28 PM  [facility] [address] [place] [address] [place], [place], [address] Phone: [phone]   Fax:  [phone]  Name: Sarah [name] MRN: [id number] Date of Birth: 1932-10-05

## 2019-11-10 NOTE — Patient Instructions (Signed)

## 2019-11-10 NOTE — Progress Notes (Signed)
HEMATOLOGY/ONCOLOGY CLINIC NOTE  Date of Service: 11/10/2019  Patient Care Team: Lajean Manes, MD as PCP - General (Internal Medicine) Buford Dresser, MD as PCP - Cardiology (Cardiology)  REFERRING PHYSICIAN: Lajean Manes, MD  CHIEF COMPLAINTS/PURPOSE OF CONSULTATION:  Continue mx of Recently Diagnosed Angioimmunoblastic T cell lymphoma      HISTORY OF PRESENTING ILLNESS:   Sarah [name] is a wonderful 86 y.o. female who has been referred to [place] by Lajean Manes, MD for evaluation and management of Possible lymphoma . She is accompanied today by her daughter Sarah [name] . The pt reports that she is doing well overall.   Pending Surgical pathology from 03/17/2019 Dr.Manny will contact clinic about pathology   August 25th/27th she began coughing and sneezing. On Sept 3 went to doctors and they said it was allergies. She began having rashes (little red dots). Went to PCP and they prescribed prednisone and for several days and the medication helped. But as soon as she completed the prescription symptoms reoccurred.   She had a chest X Ray done because she was having a severe cough on 02/17/2019 revealing "bilateral effusions in this patient with history of breast cancer,of uncertain significance with question of right lower lobe pulmonary nodule, consider chest CT for further assessment. Atherosclerotic changes in the thoracic aorta."  Dr. Amy Martinique biopsied the rashes on her back 02/21/2019 and it was determined that it could be because of an allergy to medication. Dr.Stoneking stopped her blood pressure medication from September- October but ther rash still present. The itching started 1 to 2 months ago. The rash would come and go and was mostly on her back. She still has rash on her left thigh.  She is using triaconazole and cetrizine.  She has lumps behind ear and abdomin and she states that they have swollen twice the size within 1 and half months  She has no appetite and  has changes in her taste that began around August. She has changes in bowels. They are more loose and smaller In size. She also has no control over bowels.  She feels fatigued and it has increased since August.   She has a cough that starts in September  that has made her horse.   She had a CT scan on 02/27/2019 because she was having severe cough and her stomach was very extended. Revealing "1. Extensive lymphadenopathy throughout the chest, abdomen, and pelvis, with index lymph nodes identified above. Splenomegaly. Findings are most consistent with lymphoma with recurrent, metastatic breast malignancy less favored. 2. There is generally osteopenic appearance of the skeleton. There are numerous subtle hypodense lesions, particularly of the pelvis (e.g. Series 2, image 88) and select vertebral bodies, for example L4 (series 8, image 92). This is suspicious although not definitive for osseous metastatic disease. Characterization for metabolic activity by nuclear scintigraphic bone scan or PET-CT would be helpful to further evaluate. 3. Moderate right, small left pleural effusions with associated atelectasis or consolidation. Subpleural radiation fibrosis of the anterior right lung. 4. There is a new subpleural pulmonary nodule of the anterior right middle lobe measuring 6 mm (series 4, image 105), nonspecific. There is no obvious pleural thickening or nodularity definitive for metastatic disease. 5.  Small volume ascites. 6. There is a fluid attenuation lesion of the pancreatic uncinate measuring 2.4 x 1.3 cm (series 2, image 63), not substantially changed when compared to remote prior MR examination dated 02/23/2012 and likely sequelae of a prior pseudocyst versus incidental pancreatic IPMN. 7.  Cardiomegaly."  Hospitalized from 03/06/2019-03/08/2019. She presenting with acute shortness of breath, leg swelling,left hand swelling and some weight gain despite poor appetite. In ED, CTA chest revealed at least  submassive PE, moderate right pleural effusion, diffuse LAD concerning for lymphoma. Started on heparin drip. PCCM consulted for guidance. She had thoracocentesis with removal of 1200 cc cloudy exudative pleural fluid. Cardiology consulted for elevated which was thought to be demand ischemia from right heart strain in the setting of PE. Patient was transitioned to subcu Lovenox by pulmonology the next day and remained stable. Unusual appearance of tracheal air column at the thoracic inlet. Correlate with any history of swallowing difficulty or changes in voice with dedicated imaging with chest CT and or CT of the neck as indicated.  She has a nonhealing ulcer on her right tibialis anterior that starting weeping one week ago. Of note since the patient's last visit, pt has had CHEST XRAY - 2 VIEW (Accession [id number]) completed on 02/17/2019 with results revealing "Bilateral effusions in this patient with history of breast cancer,of uncertain significance with question of right lower lobepulmonary nodule, consider chest CT for furtherassessment.Unusual appearance of tracheal air column at the thoracic inlet.Correlate with any history of swallowing difficulty or changes in voice with dedicated imaging with chest CT and or CT of the neck as Indicated. Atherosclerotic changes in the thoracic aorta."  Of note since the patient's last visit, pt has had CT CHEST, ABDOMEN, AND PELVIS WITH CONTRAST (Accession [id number]) completed on 02/27/2019 with results revealing "1. Extensive lymphadenopathy throughout the chest, abdomen, and pelvis, with index lymph nodes identified above. Splenomegaly.Findings are most consistent with lymphoma with recurrent, metastatic breast malignancy less favored. 2. There is generally osteopenic appearance of the skeleton. There are numerous subtle hypodense lesions, particularly of the pelvis (e.g. Series 2, image 88) and select vertebral bodies, for example L4 (series 8, image 92).  This is suspicious although not definitive for osseous metastatic disease. Characterization for metabolic activity by nuclear scintigraphic bone scan or PET-CT would be helpful to further evaluate.  3. Moderate right, small left pleural effusions with associated atelectasis or consolidation. Subpleural radiation fibrosis of the anterior right lung.4. There is a new subpleural pulmonary nodule of the anterior right middle lobe measuring 6 mm (series 4, image 105), nonspecific. There is no obvious pleural thickening or nodularity definitive for metastatic disease.5.  Small volume ascites. 6. There is a fluid attenuation lesion of the pancreatic uncinate measuring 2.4 x 1.3 cm (series 2, image 63), not substantially changed when compared to remote prior MR examination dated 02/23/2012 and likely sequelae of a prior pseudocyst versus incidental pancreatic IPMN.7.  Cardiomegaly.8. Other chronic and incidental findings as detailed above. Aortic Atherosclerosis (ICD10-I70.0)."  Of note since the patient's last visit, pt has had PORTABLE CHEST 1 VIEW (Accession [id number]) completed on 03/06/2019 with results revealing "1. Moderate size right and small left pleural effusions appear not significantly changed since 02/27/19. 2. No new cardiopulmonary abnormality."  Of note since the patient's last visit, pt has had ECHOCARDIOGRAM  completed on 03/06/2019 with results revealing  "1. Left ventricular ejection fraction, by visual estimation, is 60 to 65%. The left ventricle has normal function. Normal left ventricular size. There is no left ventricular hypertrophy. 2. Left ventricular diastolic Doppler parameters are consistent with impaired relaxation pattern of LV diastolic filling. 3. Global right ventricle has mildly reduced systolic function.The right ventricular size is moderately enlarged. No increase in right ventricular wall thickness. 4. Left atrial size was normal.  5. Right atrial size was mildly dilated.  6. Trivial pericardial effusion is present. 7. The mitral valve is normal in structure. Trace mitral valve regurgitation. No evidence of mitral stenosis. 8. The tricuspid valve is normal in structure. Tricuspid valve regurgitation severe. 9. The aortic valve is normal in structure. Aortic valve regurgitation was not visualized by color flow Doppler. Structurally normal aortic valve, with no evidence of sclerosis or stenosis.10. The pulmonic valve was normal in structure. Pulmonic valve regurgitation is mild by color flow Doppler.11. Mildly elevated pulmonary artery systolic pressure.12. The inferior vena cava is normal in size with greater than 50% respiratory variability, suggesting right atrial pressure of 3 mmHg."  Of note since the patient's last visit, pt has had CT ANGIOGRAPHY CHEST WITH CONTRAST (Accession [id number]) completed on 03/06/2019 with results revealing "1. Pulmonary embolus arising from the distal left main pulmonary artery with extension into multiple left lower lobe pulmonary arterial branches. There is also incompletely obstructing pulmonary embolus in the proximal left upper lobe pulmonary artery. More distal pulmonary embolus in the right lower lobe pulmonary artery. Positive for acute PE with CT evidence of right heart strain (RV/LV Ratio = 1.6) consistent with at least submassive (intermediate risk) PE. The presence of right heart strain has been associated with an increased risk of morbidity and mortality. Please activate Code PE by paging [phone]. 2. Sizable pleural effusion on the right with smaller pleural effusion on the left. Consolidation and compressive atelectasis throughout most of the right lower lobe. Milder atelectasis left base. Nodular opacities on the right, likely metastatic foci.3.  Stable adenopathy compared to 1 week prior.4. Enlargement of spleen, incompletely visualized but documented CT 1 week prior. Concern for potential lymphoma. 5. Aortic atherosclerosis.  No aneurysm or dissection evident. Foci of coronary artery calcification noted."  Most recent lab results (03/06/2019) of CBC is as follows: all values are WNL except for Platelets at 72, Eosinophils Absolute at 0.6,Abs Immature Granulocytes at 0.08 .  On review of systems, pt reports rashes, digestive issues and denies back pain, abdominal pain and any other symptoms.   On PMHx the pt reports Hypothyroidism, MCI, Hypercholesterolemia, Tracheal anomaly, Hypertension, Pulmonary nodule, Atherosclerosis of aorta, anxiety, gait disorder, primary insomnia.   INTERVAL HISTORY:  Sarah [name] is a 86 y.o. female here for evaluation and management of Angioimmunoblastic T-cell lymphoma. We are joined today by her daughter, Sarah [name]. The patient's last visit with [place] was on 10/29/2019. The pt reports that she is doing well overall.  The pt reports that her diarrhea has resolved, but that her rash is more bothersome. Her rash is also itchy. Pt has a significant cough, that is worse at night. The cough sometimes makes it difficult for her to sleep. Pt often wakes up with a swollen face. Sarah [name] reports that the pt is not eating well.    Lab results today (11/10/19) of CBC w/diff and CMP is as follows: all values are WNL except for Glucose at 118, Total Protein at 6.4, AST at 14. 11/10/2019 LDH at 214  On review of systems, pt reports lack of appetite, itchy rash, cough and denies diarrhea, nausea, abdominal pain/bloating, fevers, chills, SOB, chest pain, neck pain and any other symptoms.   MEDICAL HISTORY:  Past Medical History:  Diagnosis Date  . Allergy    codeine, thiazides  . Anxiety    new dx  . Arthritis   . Breast cancer ([place]) 03/14/12   bx=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,ER/PR=+,upper inner quad  .  Cancer ([place])    breast  . Cataract   . DVT (deep venous thrombosis) ([place]) 02/2019   left leg  . Dyspnea   . Edema    both legs feet and toe, abdomen  . Glaucoma     laser treated years ago  . HOH (hard of hearing)   . Hyperlipemia   . Hypertension   . Hypothyroidism   . Pancreatic cyst    benign  . PONV (postoperative nausea and vomiting)   . Pulmonary embolism ([place]) 02/2019   bilateral   . Radiation 06/11/2012-07/12/2012   17 sessions 4250 cGy, 3 sessions 750 cGy  . Vertigo   . Wears glasses   . Wears partial dentures    partial upper     SURGICAL HISTORY: Past Surgical History:  Procedure Laterality Date  . ABDOMINAL HYSTERECTOMY  1966   1/2 ovary left in   . BREAST SURGERY  1992   lumpectomy-lt  . CATARACT EXTRACTION     b/l  . COLONOSCOPY    . EXCISION MASS NECK Left 03/17/2019    EXCISION MASS NECK (Left Neck)  . EXCISION MASS NECK Left 03/17/2019   Procedure: EXCISION MASS NECK;  Surgeon: Helayne Seminole, MD;  Location: [place];  Service: ENT;  Laterality: Left;  . EYE SURGERY Bilateral    bilateral cataract removal  . IR IMAGING GUIDED PORT INSERTION  04/23/2019  . JOINT REPLACEMENT  2013   rt total knee  . JOINT REPLACEMENT  1995   lt total knee  . PARTIAL MASTECTOMY WITH NEEDLE LOCALIZATION  04/09/2012   Procedure: PARTIAL MASTECTOMY WITH NEEDLE LOCALIZATION;  Surgeon: Adin Hector, MD;  Location: [place];  Service: General;  Laterality: Right;  . SKIN BIOPSY Left 03/17/2019   LEFT THIGH  . SKIN BIOPSY Left 03/17/2019   Procedure: Skin Biopsy Left Thigh;  Surgeon: Helayne Seminole, MD;  Location: [place];  Service: ENT;  Laterality: Left;  . TONSILLECTOMY    . TOTAL KNEE ARTHROPLASTY  08/16/2011   Procedure: TOTAL KNEE ARTHROPLASTY;  Surgeon: Ninetta Lights, MD;  Location: [place];  Service: Orthopedics;  Laterality: Right;     SOCIAL HISTORY: Social History   Socioeconomic History  . Marital status: Widowed    Spouse name: Not on file  . Number of children: 2  . Years of education: Not on file  . Highest education level: Not on file  Occupational History  . Occupation: Retired     Comment: Community education officer   Tobacco Use  . Smoking status: Never Smoker  . Smokeless tobacco: Never Used  Vaping Use  . Vaping Use: Never used  Substance and Sexual Activity  . Alcohol use: No  . Drug use: No  . Sexual activity: Not Currently  Other Topics Concern  . Not on file  Social History Narrative  . Not on file   Social Determinants of Health   Financial Resource Strain:   . Difficulty of Paying Living Expenses:   Food Insecurity:   . Worried About Charity fundraiser in the Last Year:   . Arboriculturist in the Last Year:   Transportation Needs:   . Film/video editor (Medical):   Marland Kitchen Lack of Transportation (Non-Medical):   Physical Activity:   . Days of Exercise per Week:   . Minutes of Exercise per Session:   Stress:   . Feeling of Stress :   Social Connections:   . Frequency of Communication  with Friends and Family:   . Frequency of Social Gatherings with Friends and Family:   . Attends Religious Services:   . Active Member of Clubs or Organizations:   . Attends Archivist Meetings:   Marland Kitchen Marital Status:   Intimate Partner Violence:   . Fear of Current or Ex-Partner:   . Emotionally Abused:   Marland Kitchen Physically Abused:   . Sexually Abused:      FAMILY HISTORY: Family History  Problem Relation Age of Onset  . Heart disease Father   . Cancer Maternal Aunt        stomach     ALLERGIES:   is allergic to codeine, latex, and thiazide-type diuretics.   MEDICATIONS:  Current Outpatient Medications  Medication Sig Dispense Refill  . amLODipine (NORVASC) 2.5 MG tablet Take 2.5-5 mg by mouth See admin instructions. 5 mg in the morning, 2.5 mg in the evening    . apixaban (ELIQUIS) 5 MG TABS tablet Take 1 tablet (5 mg total) by mouth 2 (two) times daily. 180 tablet 0  . Cholecalciferol (VITAMIN D) 50 MCG (2000 UT) tablet Take 2,000 Units by mouth 2 (two) times daily.    . ciprofloxacin (CIPRO) 500 MG tablet Take 1 tablet (500 mg total) by mouth 2  (two) times daily. 10 tablet 0  . Cyanocobalamin (VITAMIN B-12 PO) Take 3,000 mcg by mouth daily.     Marland Kitchen gabapentin (NEURONTIN) 100 MG capsule Take 2 capsules (200 mg total) by mouth at bedtime. (Patient not taking: Reported on 07/16/2019) 60 capsule 2  . hydrocortisone 2.5 % cream APPLY CREAM TO FACE TWICE DAILY (MORNING AND EVENING )    . levothyroxine (SYNTHROID) 25 MCG tablet Take 25 mcg by mouth daily before breakfast.     . lidocaine-prilocaine (EMLA) cream Apply to affected area once 30 g 3  . LORazepam (ATIVAN) 0.5 MG tablet Take 0.5-1 tablets (0.25-0.5 mg total) by mouth every 8 (eight) hours. (Patient not taking: Reported on 10/22/2019) 30 tablet 0  . Multiple Vitamins-Minerals (PRESERVISION AREDS PO) Take 1 capsule by mouth 2 (two) times daily.     . ondansetron (ZOFRAN) 4 MG tablet Take 4 mg by mouth 2 (two) times daily as needed.    . ondansetron (ZOFRAN) 8 MG tablet Take 1 tablet (8 mg total) by mouth 2 (two) times daily as needed. Start on the third day after chemotherapy. 30 tablet 1  . polyethylene glycol (MIRALAX / GLYCOLAX) 17 g packet Take 17 g by mouth daily.    . prochlorperazine (COMPAZINE) 10 MG tablet Take 1 tablet (10 mg total) by mouth every 6 (six) hours as needed (Nausea or vomiting). 30 tablet 1  . zolpidem (AMBIEN) 10 MG tablet Take 2.5 mg by mouth at bedtime.      No current facility-administered medications for this visit.     REVIEW OF SYSTEMS:   A 10+ POINT REVIEW OF SYSTEMS WAS OBTAINED including neurology, dermatology, psychiatry, cardiac, respiratory, lymph, extremities, GI, GU, Musculoskeletal, constitutional, breasts, reproductive, HEENT.  All pertinent positives are noted in the HPI.  All others are negative.   PHYSICAL EXAMINATION: ECOG FS:1 - Symptomatic but completely ambulatory  Vitals:   11/10/19 1540  BP: 134/79  Pulse: 72  Resp: 18  Temp: 97.9 F (36.6 C)  SpO2: 100%   Wt Readings from Last 3 Encounters:  11/10/19 117 lb 3.2 oz (53.2 kg)    10/29/19 115 lb 6.4 oz (52.3 kg)  10/29/19 115 lb (52.2 kg)  Body mass index is 20.12 kg/m.    Exam was given in a chair   GENERAL:alert, in no acute distress and comfortable SKIN: no acute rashes, no significant lesions EYES: conjunctiva are pink and non-injected, sclera anicteric OROPHARYNX: MMM, no exudates, no oropharyngeal erythema or ulceration NECK: supple, no JVD LYMPH:  no palpable lymphadenopathy in the cervical, axillary or inguinal regions LUNGS: clear to auscultation b/l with normal respiratory effort HEART: regular rate & rhythm ABDOMEN:  normoactive bowel sounds , non tender, not distended. No palpable hepatosplenomegaly.  Extremity: no pedal edema PSYCH: alert & oriented x 3 with fluent speech NEURO: no focal motor/sensory deficits  LABORATORY DATA:  I have reviewed the data as listed  CBC Latest Ref Rng & Units 11/10/2019 10/21/2019 09/29/2019  WBC 4.0 - 10.5 K/uL 4.6 5.1 8.0  Hemoglobin 12.0 - 15.0 g/dL 12.3 13.7 13.4  Hematocrit 36 - 46 % 38.0 42.7 42.0  Platelets 150 - 400 K/uL 191 187 234    CMP Latest Ref Rng & Units 11/10/2019 10/21/2019 09/29/2019  Glucose 70 - 99 mg/dL 118(H) 80 97  BUN 8 - 23 mg/dL 22 17 23   Creatinine 0.44 - 1.00 mg/dL 0.74 0.72 0.70  Sodium 135 - 145 mmol/L 143 141 143  Potassium 3.5 - 5.1 mmol/L 3.8 4.5 3.8  Chloride 98 - 111 mmol/L 107 105 105  CO2 22 - 32 mmol/L 26 25 25   Calcium 8.9 - 10.3 mg/dL 9.5 9.5 9.4  Total Protein 6.5 - 8.1 g/dL 6.4(L) 6.8 6.9  Total Bilirubin 0.3 - 1.2 mg/dL 0.4 0.5 0.4  Alkaline Phos 38 - 126 U/L 77 76 64  AST 15 - 41 U/L 14(L) 13(L) 18  ALT 0 - 44 U/L 13 11 17    Component     Latest Ref Rng & Units 03/27/2019  Hepatitis B Surface Ag     NON REACTIVE NON REACTIVE  Hep B Core Total Ab     NON REACTIVE NON REACTIVE  HCV Ab     NON REACTIVE NON REACTIVE  LDH     98 - 192 U/L 252 (H)   . Lab Results  Component Value Date   LDH 214 (H) 11/10/2019      04/08/2019 NM PET Image Initial (PI)  Skull Base To Thigh (Accession [id number])  04/04/2019 PATHOLOGY   04/07/2019 ECHOCARDIOGRAM  03/28/2019 PATHOLOGY   03/06/2019 CT ANGIOGRAPHY CHEST WITH CONTRAST (Accession [id number])  04/07/2019 ECHOCARDIOGRAM  03/06/2019 ECHOCARDIOGRAM     03/06/2019 PORTABLE CHEST 1 VIEW (Accession [id number])    02/27/2019 CT CHEST, ABDOMEN, AND PELVIS WITH CONTRAST (Accession [id number])    02/17/2019 CHEST XRAY - 2 VIEW (Accession [id number])    RADIOGRAPHIC STUDIES: I have personally reviewed the radiological images as listed and agreed with the findings in the report. No results found.   ASSESSMENT & PLAN:   ATHEENA SPANO is a 86 y.o. female with:  1. 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 SKIN, LEFT THIGH, BIOPSY: - Involvement by angioimmunoblastic T-cell lymphoma." -04/07/2019 Echocardiogram - normal EF -04/08/2019 NM PET Image Initial (PI) Skull Base To Thigh (Accession [id number]) revealed "1. Widespread hypermetabolic adenopathy in the neck, chest, and abdomen/pelvis, primarily in the Deauville 4 and Deauville 5 range. There is also hypermetabolic activity along the posterior nasopharynx in lingual tonsillar regions potentially indicating sites of involvement. 2. The subtle hypodensities in the spine and bony pelvis are not appreciably  hypermetabolic may be incidental, surveillance is suggested. 3. Bilateral thyroid activity but especially on the left. Probably from thyroiditis. 4. A 6 mm subpleural nodule anteriorly in the right lower lobe not appreciably hypermetabolic but is below sensitive PET-CT size thresholds and merits surveillance. 5. Mildly accentuated diffuse splenic activity without splenomegaly or focal splenic lesion identified. 6. Other imaging findings of potential clinical significance: Aortic  Atherosclerosis (ICD10-I70.0). Coronary atherosclerosis. Large right and small left pleural effusions. Moderate cardiomegaly. Small pericardial effusion. Mild mesenteric edema. Large left renal cyst. Fullness of both renal collecting systems with suspected nonobstructive left nephrolithiasis. Degenerative grade 1 anterolisthesis at L4-5 at L5-S1. Trace pelvic ascites." -05/28/2019 PET/CT ([id number]) revealed "Complete metabolic response to therapy". -09/16/2019 PET/CT ([id number]) revealed "1. Unfortunately evidence of lymphoma recurrence at sites of previous hypermetabolic adenopathy. Hypermetabolic lymphoid tissue is new from comparison PET-CT 05/28/2019 but at same locations as PET-CT 04/08/2019. The number of metastatic lymph nodes is less than 04/08/2019. Lymphoid tissue at the RIGHT skull base is larger. 2. Three foci of hypermetabolic recurrence are present, lymphoid tissue at the RIGHT base of skull, hypermetabolic RIGHT hilar lymph node and hypermetabolic lymph node in the porta hepatis."  2. Pulmonary Embolism -extensive .likely related to extensive malignancy  3. H/o Breast Cancer  -The patient had bilateral diagnostic  mammography at [facility] 07/11/2011. This  showed some calcifications in the right  breast, which seemed a little bit more  prominent than prior. In the left breast  there was an area of possible  architectural distortion. However left  breast ultrasound the same day showed  no abnormality. 6 month followup was  suggested, and on 02/28/2012 the  patient again had bilateral diagnostic  mammography, now with right  ultrasonography. The microcalcifications  in the right breast appeared increased.  Ultrasound showed a hypoechoic lesion  measuring 5 mm, with no associated  shadowing. This had been previously  noted and appeared unchanged. Anterior  to that there was a small hypoechoic  mass measuring 4 mm in diameter.  There was felt to be suspicious, and on  03/14/2012 the patient  underwent biopsy  of the right breast mass, showing (SAA  [id number]) ductal carcinoma in situ,  intermediate grade, estrogen receptor  100% and progesterone receptor 100%  positive.   -Bilateral breast MRI was obtained  03/26/2012. This showed only post  biopsy changes in the right breast,  associated with an area of non-masslike  enhancement measuring 2.4 cm. The  left breast was unremarkable, and there  was no enlarged axillary or internal  mammary adenopathy noted.  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 The patient's  subsequent history is as detailed below.   4. Rt breast discomfort on palpable , nipple ulceration and bloody discharge.  PLAN: -Discussed pt labwork today, 11/10/19; blood counts are nml, blood chemistries are stable, LDH is borderline elevated  -Lack of appetite could be due to progression of lymphoma. Rash appears to be caused by lymphoma.  -Brentuximab may not be holding disease. May consider restarting chemotherapy - Will complete C3 Brentuximab and repeat PET/CT.  -The pt has no prohibitive toxicities from continuing C3 Brentuximab at this time. -Advised pt that we can only continue treatment as long as pt is able to sustain herself nutritionally.  -Recommended that the pt continue to eat well, drink at least 48-64 oz  of water each day, and walk 20-30 minutes each day.  -Advised pt that available appetite stimulants include Marinol and Megace. Pt advised that Megace can increase risk of blood clots and Marinol can create a loopy feeling - Will hold off at this time -Continue B-complex Vitamin  -Recommend OTC Benadryl  -Will get repeat PET/CT in 2 weeks -Will see back in 3 weeks with labs     FOLLOW-UP: F/u for Brentuximab as scheduled tomorrow PET/CT in 2 weeks Plz schedule next cycle of  Brentuximab-vedotin with labs and MD visit in 3 weeks    The total time spent in the appt was 30 minutes and more than 50% was on counseling and direct patient cares.  All of the patient's questions were answered with apparent satisfaction. The patient knows to call the clinic with any problems, questions or concerns.    Sullivan Lone MD [place] AAHIVMS [facility] Hematology/Oncology Physician [facility]  (Office):       [phone] (Work cell):  [phone] (Fax):           [phone]  11/10/2019 4:43 PM    I, Yevette Edwards, am acting as a scribe for Dr. Sullivan Lone.   .I have reviewed the above documentation for accuracy and completeness, and I agree with the above. Brunetta Genera MD

## 2019-11-11 ENCOUNTER — Other Ambulatory Visit: Payer: Self-pay

## 2019-11-11 ENCOUNTER — Inpatient Hospital Stay: Payer: Medicare Other

## 2019-11-11 ENCOUNTER — Other Ambulatory Visit: Payer: Medicare Other

## 2019-11-11 VITALS — BP 128/68 | HR 86 | Temp 98.0°F | Resp 18

## 2019-11-11 DIAGNOSIS — Z7189 Other specified counseling: Secondary | ICD-10-CM

## 2019-11-11 DIAGNOSIS — Z5112 Encounter for antineoplastic immunotherapy: Secondary | ICD-10-CM | POA: Diagnosis not present

## 2019-11-11 DIAGNOSIS — C8440 Peripheral T-cell lymphoma, not classified, unspecified site: Secondary | ICD-10-CM

## 2019-11-11 MED ORDER — ONDANSETRON HCL 4 MG/2ML IJ SOLN
INTRAMUSCULAR | Status: AC
  Filled 2019-11-11: qty 2

## 2019-11-11 MED ORDER — DEXAMETHASONE SODIUM PHOSPHATE 10 MG/ML IJ SOLN
5.0000 mg | Freq: Once | INTRAMUSCULAR | Status: AC
  Administered 2019-11-11: 5 mg via INTRAVENOUS

## 2019-11-11 MED ORDER — DIPHENHYDRAMINE HCL 50 MG/ML IJ SOLN
INTRAMUSCULAR | Status: AC
  Filled 2019-11-11: qty 1

## 2019-11-11 MED ORDER — DIPHENHYDRAMINE HCL 50 MG/ML IJ SOLN
50.0000 mg | Freq: Once | INTRAMUSCULAR | Status: AC
  Administered 2019-11-11: 50 mg via INTRAVENOUS

## 2019-11-11 MED ORDER — FAMOTIDINE IN NACL 20-0.9 MG/50ML-% IV SOLN
20.0000 mg | Freq: Once | INTRAVENOUS | Status: AC
  Administered 2019-11-11: 20 mg via INTRAVENOUS

## 2019-11-11 MED ORDER — ACETAMINOPHEN 325 MG PO TABS
650.0000 mg | ORAL_TABLET | Freq: Once | ORAL | Status: AC
  Administered 2019-11-11: 650 mg via ORAL

## 2019-11-11 MED ORDER — SODIUM CHLORIDE 0.9% FLUSH
10.0000 mL | INTRAVENOUS | Status: DC | PRN
  Administered 2019-11-11: 10 mL
  Filled 2019-11-11: qty 10

## 2019-11-11 MED ORDER — DEXAMETHASONE SODIUM PHOSPHATE 10 MG/ML IJ SOLN
INTRAMUSCULAR | Status: AC
  Filled 2019-11-11: qty 1

## 2019-11-11 MED ORDER — FAMOTIDINE IN NACL 20-0.9 MG/50ML-% IV SOLN
INTRAVENOUS | Status: AC
  Filled 2019-11-11: qty 50

## 2019-11-11 MED ORDER — SODIUM CHLORIDE 0.9 % IV SOLN
Freq: Once | INTRAVENOUS | Status: AC
  Filled 2019-11-11: qty 250

## 2019-11-11 MED ORDER — ONDANSETRON HCL 4 MG/2ML IJ SOLN
4.0000 mg | Freq: Once | INTRAMUSCULAR | Status: AC
  Administered 2019-11-11: 4 mg via INTRAVENOUS

## 2019-11-11 MED ORDER — HEPARIN SOD (PORK) LOCK FLUSH 100 UNIT/ML IV SOLN
500.0000 [IU] | Freq: Once | INTRAVENOUS | Status: AC | PRN
  Administered 2019-11-11: 500 [IU]
  Filled 2019-11-11: qty 5

## 2019-11-11 MED ORDER — ACETAMINOPHEN 325 MG PO TABS
ORAL_TABLET | ORAL | Status: AC
  Filled 2019-11-11: qty 2

## 2019-11-11 MED ORDER — SODIUM CHLORIDE 0.9 % IV SOLN
1.8000 mg/kg | Freq: Once | INTRAVENOUS | Status: AC
  Administered 2019-11-11: 100 mg via INTRAVENOUS
  Filled 2019-11-11: qty 20

## 2019-11-11 NOTE — Patient Instructions (Signed)
[facility] Discharge Instructions for Patients Receiving Chemotherapy  Today you received the following chemotherapy agents: brentuximab  To help prevent nausea and vomiting after your treatment, we encourage you to take your nausea medication as directed.   If you develop nausea and vomiting that is not controlled by your nausea medication, call the clinic.   BELOW ARE SYMPTOMS THAT SHOULD BE REPORTED IMMEDIATELY:  *FEVER GREATER THAN 100.5 F  *CHILLS WITH OR WITHOUT FEVER  NAUSEA AND VOMITING THAT IS NOT CONTROLLED WITH YOUR NAUSEA MEDICATION  *UNUSUAL SHORTNESS OF BREATH  *UNUSUAL BRUISING OR BLEEDING  TENDERNESS IN MOUTH AND THROAT WITH OR WITHOUT PRESENCE OF ULCERS  *URINARY PROBLEMS  *BOWEL PROBLEMS  UNUSUAL RASH Items with * indicate a potential emergency and should be followed up as soon as possible.  Feel free to call the clinic should you have any questions or concerns. The clinic phone number is [phone].  Please show the [place] at check-in to the Emergency Department and triage nurse.

## 2019-11-13 ENCOUNTER — Ambulatory Visit: Payer: Medicare Other

## 2019-11-13 ENCOUNTER — Other Ambulatory Visit: Payer: Self-pay

## 2019-11-13 DIAGNOSIS — R2689 Other abnormalities of gait and mobility: Secondary | ICD-10-CM | POA: Diagnosis not present

## 2019-11-13 DIAGNOSIS — M25562 Pain in left knee: Secondary | ICD-10-CM

## 2019-11-13 DIAGNOSIS — G8929 Other chronic pain: Secondary | ICD-10-CM

## 2019-11-13 NOTE — Patient Instructions (Signed)
Access Code: [email or web address] URL: [email or web address] Date: 11/13/2019 Prepared by: Claiborne Billings  Exercises   Seated Shoulder Abduction - Palms Down - 2 x daily - 7 x weekly - 10 reps - 2 sets Seated Shoulder Flexion - 2 x daily - 7 x weekly - 10 reps - 2 sets Seated Shoulder Scaption - 2 x daily - 7 x weekly - 10 reps - 2 sets

## 2019-11-13 NOTE — Therapy (Signed)
[facility] [address], [place], [place], [address] Phone: [phone]   Fax:  [phone]  Physical Therapy Treatment  Patient Details  Name: Sarah [name] MRN: [id number] Date of Birth: 1933/03/12 Referring Provider (PT): Sullivan Lone MD   Encounter Date: 11/13/2019   PT End of Session - 11/13/19 1220    Visit Number 4    Date for PT Re-Evaluation 12/24/19    Authorization Type Medicare/BCBS    Progress Note Due on Visit 10    PT Start Time 6962    PT Stop Time 1228    PT Time Calculation (min) 42 min    Activity Tolerance Patient tolerated treatment well    Behavior During Therapy [facility] for tasks assessed/performed           Past Medical History:  Diagnosis Date   Allergy    codeine, thiazides   Anxiety    new dx   Arthritis    Breast cancer ([place]) 03/14/12   bx=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,ER/PR=+,upper inner quad   Cancer ([place])    breast   Cataract    DVT (deep venous thrombosis) ([place]) 02/2019   left leg   Dyspnea    Edema    both legs feet and toe, abdomen   Glaucoma    laser treated years ago   HOH (hard of hearing)    Hyperlipemia    Hypertension    Hypothyroidism    Pancreatic cyst    benign   PONV (postoperative nausea and vomiting)    Pulmonary embolism ([place]) 02/2019   bilateral    Radiation 06/11/2012-07/12/2012   17 sessions 4250 cGy, 3 sessions 750 cGy   Vertigo    Wears glasses    Wears partial dentures    partial upper    Past Surgical History:  Procedure Laterality Date   ABDOMINAL HYSTERECTOMY  1966   1/2 ovary left in    [place]   lumpectomy-lt   CATARACT EXTRACTION     b/l   COLONOSCOPY     EXCISION MASS NECK Left 03/17/2019    EXCISION MASS NECK (Left Neck)   EXCISION MASS NECK Left 03/17/2019   Procedure: EXCISION MASS NECK;  Surgeon: Helayne Seminole, MD;  Location: [place];  Service: ENT;   Laterality: Left;   EYE SURGERY Bilateral    bilateral cataract removal   IR IMAGING GUIDED PORT INSERTION  04/23/2019   JOINT REPLACEMENT  2013   rt total knee   JOINT REPLACEMENT  1995   lt total knee   PARTIAL MASTECTOMY WITH NEEDLE LOCALIZATION  04/09/2012   Procedure: PARTIAL MASTECTOMY WITH NEEDLE LOCALIZATION;  Surgeon: Adin Hector, MD;  Location: [place];  Service: General;  Laterality: Right;   SKIN BIOPSY Left 03/17/2019   LEFT THIGH   SKIN BIOPSY Left 03/17/2019   Procedure: Skin Biopsy Left Thigh;  Surgeon: Helayne Seminole, MD;  [facility];  Service: ENT;  Laterality: Left;   TONSILLECTOMY     TOTAL KNEE ARTHROPLASTY  08/16/2011   Procedure: TOTAL KNEE ARTHROPLASTY;  Surgeon: Ninetta Lights, MD;  Location: [place];  Service: Orthopedics;  Laterality: Right;    There were no vitals filed for this visit.   Subjective Assessment - 11/13/19 1151    Subjective I felt OK after my last session.  I am doing my exercises 1x/day.  I should be doing them more.    Patient Stated  Goals Improve my balance and help improve my achy knees.    Currently in Pain? No/denies                             [facility] Adult PT Treatment/Exercise - 11/13/19 0001      High Level Balance   High Level Balance Comments tandem stance 2x 10 seconds bil each      Knee/Hip Exercises: Aerobic   Nustep Level 2 x 6 minutes- PT present to discuss progress   fatigue of legs and stopped early     Knee/Hip Exercises: Standing   Heel Raises Both;2 sets;10 reps    Rocker Board 3 minutes      Knee/Hip Exercises: Seated   Long Arc Quad Strengthening;Both;2 sets;10 reps    [employer] x20    Marching Strengthening;20 reps    Sit to [employer] 2 sets;10 reps;Other (comment)   from black pad     Shoulder Exercises: Seated   Row Strengthening;20 reps;Theraband    Theraband Level (Shoulder Row) Level 2 (Red)    Other Seated Exercises 3 way raises: 2x10 bil  each                  PT Education - 11/13/19 1159    Education Details Access Code: [email or web address]    Person(s) Educated Patient    Methods Explanation;Demonstration;Handout    Comprehension Verbalized understanding;Returned demonstration            PT Short Term Goals - 10/22/19 1441      PT SHORT TERM GOAL #1   Title Pt will be independent with HEP within 3 weeks in order to demonstrate autonomy of care.    Baseline pt does not have an HEP    Time 3    Period Weeks    Status New    Target Date 11/19/19             PT Long Term Goals - 10/22/19 1456      PT LONG TERM GOAL #1   Title Pt will improve TUG score to 13.5 seconds or less in order to demonstrate a decreased risk for falls.    Baseline 20 seconds    Time 8    Period Weeks    Status New    Target Date 12/24/19      PT LONG TERM GOAL #2   Title Pt will demonstrate 8x sit to stand w/o use of UE from standard chair height in order to demonstrate improved functional LE strength.    Baseline 7x with intermittent UE use on the legs and on slightly raised plinth table.    Time 8    Period Weeks    Status New    Target Date 12/24/19      PT LONG TERM GOAL #3   Title Pt will report 3/10 pain or less and 50% or greater improvement in funtional mobility and pain with activity within 8 weeks to demonstrate an overall subjective improvement in quality of life.    Baseline 7/10 pain at the knees.    Time 8    Period Weeks    Status New    Target Date 12/24/19                 Plan - 11/13/19 1220    Clinical Impression Statement Pt remains challenged by current level of exercise/activity in the clinic.  Pt experienced fatigue with NuStep today. Pt  is now performing her HEP 1-2x/day for strength and endurance gains.   Pt required close stand by assistance for safety with exercise and for verbal cueing for technique.   Pt will continue to benefit from skilled PT to address balance and strength deficits  associated with lymphoma.    PT Frequency 2x / week    PT Duration 8 weeks    PT Treatment/Interventions Therapeutic activities;Therapeutic exercise;Neuromuscular re-education;Moist Heat;Manual techniques;Patient/family education    PT Next Visit Plan work on balance activities, core/pelvic and LE strengthening/endurance.    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   Counseling regarding advance care planning and goals of care 04/14/2019   Lymphadenopathy of head and neck 03/17/2019   Lymph nodes enlarged 03/17/2019   History of pulmonary embolus (PE) 03/17/2019   Acute pulmonary embolism ([place]) 03/06/2019   Diffuse lymphadenopathy 03/06/2019   Pleural effusion 03/06/2019   Pruritus 02/06/2019   Pruritic rash 02/06/2019   Angio-edema 02/06/2019   Shortness of breath 02/06/2019   Osteopenia 12/18/2013   Heart block AV first degree 12/18/2013   Breast cancer of upper-inner quadrant of right female breast ([place]) 03/19/2012   Right knee DJD 08/18/2011   Hypothyroidism    Hypertension    PONV (postoperative nausea and vomiting)    Hyperlipemia      Sigurd Sos, PT 11/13/19 12:23 PM  [facility] [address], [place], [place], [address] Phone: [phone]   Fax:  [phone]  Name: Sarah [name] MRN: [id number] Date of Birth: Jan 10, 1933

## 2019-11-19 ENCOUNTER — Ambulatory Visit: Payer: Medicare Other

## 2019-11-19 ENCOUNTER — Other Ambulatory Visit: Payer: Self-pay

## 2019-11-19 DIAGNOSIS — G8929 Other chronic pain: Secondary | ICD-10-CM

## 2019-11-19 DIAGNOSIS — R2689 Other abnormalities of gait and mobility: Secondary | ICD-10-CM | POA: Diagnosis not present

## 2019-11-19 DIAGNOSIS — M25562 Pain in left knee: Secondary | ICD-10-CM

## 2019-11-19 DIAGNOSIS — M25561 Pain in right knee: Secondary | ICD-10-CM

## 2019-11-19 NOTE — Therapy (Signed)
[facility] [address] [place], [place], [address] Phone: [phone]   Fax:  [phone]  Physical Therapy Treatment  Patient Details  Name: Sarah [name] MRN: [id number] Date of Birth: 06-16-1932 Referring Provider (PT): Sullivan Lone MD   Encounter Date: 11/19/2019   PT End of Session - 11/19/19 1320    Visit Number 5    Date for PT Re-Evaluation 12/24/19    Authorization Type Medicare/BCBS    Progress Note Due on Visit 10    PT Start Time 1232    PT Stop Time 1310    PT Time Calculation (min) 38 min    Activity Tolerance Patient tolerated treatment well    Behavior During Therapy [facility] for tasks assessed/performed           Past Medical History:  Diagnosis Date  . Allergy    codeine, thiazides  . Anxiety    new dx  . Arthritis   . 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 Cancer ([place])    breast  . Cataract   . DVT (deep venous thrombosis) ([place]) 02/2019   left leg  . Dyspnea   . Edema    both legs feet and toe, abdomen  . Glaucoma    laser treated years ago  . HOH (hard of hearing)   . Hyperlipemia   . Hypertension   . Hypothyroidism   . Pancreatic cyst    benign  . PONV (postoperative nausea and vomiting)   . Pulmonary embolism ([place]) 02/2019   bilateral   . Radiation 06/11/2012-07/12/2012   17 sessions 4250 cGy, 3 sessions 750 cGy  . Vertigo   . Wears glasses   . Wears partial dentures    partial upper    Past Surgical History:  Procedure Laterality Date  . ABDOMINAL HYSTERECTOMY  1966   1/2 ovary left in   . BREAST SURGERY  1992   lumpectomy-lt  . CATARACT EXTRACTION     b/l  . COLONOSCOPY    . EXCISION MASS NECK Left 03/17/2019    EXCISION MASS NECK (Left Neck)  . EXCISION MASS NECK Left 03/17/2019   Procedure: EXCISION MASS NECK;  Surgeon: Helayne Seminole, MD;  Location: [place];  Service: ENT;   Laterality: Left;  . EYE SURGERY Bilateral    bilateral cataract removal  . IR IMAGING GUIDED PORT INSERTION  04/23/2019  . JOINT REPLACEMENT  2013   rt total knee  . JOINT REPLACEMENT  1995   lt total knee  . PARTIAL MASTECTOMY WITH NEEDLE LOCALIZATION  04/09/2012   Procedure: PARTIAL MASTECTOMY WITH NEEDLE LOCALIZATION;  Surgeon: Adin Hector, MD;  Location: [place];  Service: General;  Laterality: Right;  . SKIN BIOPSY Left 03/17/2019   LEFT THIGH  . SKIN BIOPSY Left 03/17/2019   Procedure: Skin Biopsy Left Thigh;  Surgeon: Helayne Seminole, MD;  Location: [place];  Service: ENT;  Laterality: Left;  . TONSILLECTOMY    . TOTAL KNEE ARTHROPLASTY  08/16/2011   Procedure: TOTAL KNEE ARTHROPLASTY;  Surgeon: Ninetta Lights, MD;  Location: [place];  Service: Orthopedics;  Laterality: Right;    There were no vitals filed for this visit.   Subjective Assessment - 11/19/19 1239    Subjective I feel tired after I leave but not too bad.    Currently in Pain? No/denies  [facility] Adult PT Treatment/Exercise - 11/19/19 0001      High Level Balance   High Level Balance Comments tandem stance 2x 10 seconds bil each      Knee/Hip Exercises: Aerobic   Nustep Level 2 x 8 minutes- PT present to discuss progress   improved tolerance today      Knee/Hip Exercises: Standing   Heel Raises Both;2 sets;10 reps    Hip Abduction Stengthening;Both;2 sets;10 reps    Rocker Board 3 minutes      Knee/Hip Exercises: Seated   Long Arc Quad Strengthening;Both;2 sets;10 reps    Long Arc Quad Weight 2 lbs.    Ball Squeeze x20    Marching Strengthening;20 reps;Weights    [employer] 2 lbs.    Sit to Sand 2 sets;10 reps;Other (comment)   from black pad     Shoulder Exercises: Seated   Other Seated Exercises 3 way raises: 2x10 bil each                    PT Short Term Goals - 10/22/19 1441      PT SHORT TERM GOAL #1    Title Pt will be independent with HEP within 3 weeks in order to demonstrate autonomy of care.    Baseline pt does not have an HEP    Time 3    Period Weeks    Status New    Target Date 11/19/19             PT Long Term Goals - 10/22/19 1456      PT LONG TERM GOAL #1   Title Pt will improve TUG score to 13.5 seconds or less in order to demonstrate a decreased risk for falls.    Baseline 20 seconds    Time 8    Period Weeks    Status New    Target Date 12/24/19      PT LONG TERM GOAL #2   Title Pt will demonstrate 8x sit to stand w/o use of UE from standard chair height in order to demonstrate improved functional LE strength.    Baseline 7x with intermittent UE use on the legs and on slightly raised plinth table.    Time 8    Period Weeks    Status New    Target Date 12/24/19      PT LONG TERM GOAL #3   Title Pt will report 3/10 pain or less and 50% or greater improvement in funtional mobility and pain with activity within 8 weeks to demonstrate an overall subjective improvement in quality of life.    Baseline 7/10 pain at the knees.    Time 8    Period Weeks    Status New    Target Date 12/24/19                 Plan - 11/19/19 1322    Clinical Impression Statement Pt remains challenged by current level of exercise/activity in the clinic.  Pt experienced less fatigue with NuStep today. Pt is now performing her HEP 1-2x/day for strength and endurance gains.   Pt required close stand by assistance for safety with exercise and for verbal cueing for technique.   Pt will continue to benefit from skilled PT to address balance and strength deficits associated with lymphoma.    PT Frequency 2x / week    PT Duration 8 weeks    PT Treatment/Interventions Therapeutic activities;Therapeutic exercise;Neuromuscular re-education;Moist Heat;Manual techniques;Patient/family education  PT Next Visit Plan work on balance activities, core/pelvic and LE strengthening/endurance.    PT  Home Exercise Plan Access Code: [email or web address]    Consulted and Agree with Plan of Care Patient           Patient will benefit from skilled therapeutic intervention in order to improve the following deficits and impairments:  Abnormal gait, Decreased strength, Decreased balance, Pain  Visit Diagnosis: Abnormality of gait due to impairment of balance  Balance disorder  Chronic pain of left knee  Chronic pain of right knee     Problem List Patient Active Problem List   Diagnosis Date Noted  . Port-A-Cath in place 06/04/2019  . 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 Counseling regarding advance care planning and goals of care 04/14/2019  . Lymphadenopathy of head and neck 03/17/2019  . Lymph nodes enlarged 03/17/2019  . History of pulmonary embolus (PE) 03/17/2019  . Acute pulmonary embolism ([place]) 03/06/2019  . Diffuse lymphadenopathy 03/06/2019  . Pleural effusion 03/06/2019  . Pruritus 02/06/2019  . Pruritic rash 02/06/2019  . Angio-edema 02/06/2019  . Shortness of breath 02/06/2019  . Osteopenia 12/18/2013  . Heart block AV first degree 12/18/2013  . Breast cancer of upper-inner quadrant of right female breast ([place]) 03/19/2012  . Right knee DJD 08/18/2011  . Hypothyroidism   . Hypertension   . PONV (postoperative nausea and vomiting)   . Hyperlipemia     Sigurd Sos, PT 11/19/19 1:24 PM  [facility] [address], [place], [place], [address] Phone: [phone]   Fax:  [phone]  Name: Sarah [name] MRN: [id number] Date of Birth: 1932-10-27

## 2019-11-25 ENCOUNTER — Ambulatory Visit (HOSPITAL_COMMUNITY)
Admission: RE | Admit: 2019-11-25 | Discharge: 2019-11-25 | Disposition: A | Discharge status: Home / Self Care | Payer: Medicare Other | Source: Ambulatory Visit | Attending: Hematology | Admitting: Hematology

## 2019-11-25 ENCOUNTER — Encounter: Payer: Self-pay | Admitting: Podiatry

## 2019-11-25 ENCOUNTER — Ambulatory Visit (INDEPENDENT_AMBULATORY_CARE_PROVIDER_SITE_OTHER): Payer: Medicare Other | Admitting: Podiatry

## 2019-11-25 ENCOUNTER — Other Ambulatory Visit: Payer: Self-pay

## 2019-11-25 DIAGNOSIS — Z5111 Encounter for antineoplastic chemotherapy: Secondary | ICD-10-CM

## 2019-11-25 DIAGNOSIS — B351 Tinea unguium: Secondary | ICD-10-CM

## 2019-11-25 DIAGNOSIS — C865 Angioimmunoblastic T-cell lymphoma: Secondary | ICD-10-CM | POA: Insufficient documentation

## 2019-11-25 DIAGNOSIS — M79674 Pain in right toe(s): Secondary | ICD-10-CM

## 2019-11-25 DIAGNOSIS — M79675 Pain in left toe(s): Secondary | ICD-10-CM

## 2019-11-25 DIAGNOSIS — R197 Diarrhea, unspecified: Secondary | ICD-10-CM | POA: Diagnosis present

## 2019-11-25 DIAGNOSIS — C8440 Peripheral T-cell lymphoma, not classified, unspecified site: Secondary | ICD-10-CM

## 2019-11-25 LAB — GLUCOSE, CAPILLARY: Glucose-Capillary: 89 mg/dL (ref 70–99)

## 2019-11-25 MED ORDER — FLUDEOXYGLUCOSE F - 18 (FDG) INJECTION
6.6000 | Freq: Once | INTRAVENOUS | Status: AC | PRN
  Administered 2019-11-25: 6.6 via INTRAVENOUS

## 2019-11-26 ENCOUNTER — Ambulatory Visit: Payer: Medicare Other | Attending: Hematology

## 2019-11-26 DIAGNOSIS — M25561 Pain in right knee: Secondary | ICD-10-CM | POA: Insufficient documentation

## 2019-11-26 DIAGNOSIS — G8929 Other chronic pain: Secondary | ICD-10-CM | POA: Diagnosis not present

## 2019-11-26 DIAGNOSIS — R2689 Other abnormalities of gait and mobility: Secondary | ICD-10-CM | POA: Diagnosis not present

## 2019-11-26 DIAGNOSIS — M25562 Pain in left knee: Secondary | ICD-10-CM | POA: Insufficient documentation

## 2019-11-26 NOTE — Progress Notes (Signed)
Pharmacist Chemotherapy Monitoring - Follow Up Assessment    I verify that I have reviewed each item in the below checklist:  . Regimen for the patient is scheduled for the appropriate day and plan matches scheduled date. Marland Kitchen Appropriate non-routine labs are ordered dependent on drug ordered. . If applicable, additional medications reviewed and ordered per protocol based on lifetime cumulative doses and/or treatment regimen.   Plan for follow-up and/or issues identified: No . I-vent associated with next due treatment: No . MD and/or nursing notified: No  Philomena Course 11/26/2019 8:44 AM

## 2019-11-26 NOTE — Therapy (Signed)
[facility] [address], [place], [place], [address] Phone: [phone]   Fax:  [phone]  Physical Therapy Treatment  Patient Details  Name: Sarah [name] MRN: [id number] Date of Birth: Jun 18, 1932 Referring Provider (PT): Sullivan Lone MD   Encounter Date: 11/26/2019   PT End of Session - 11/26/19 1310    Visit Number 6    Date for PT Re-Evaluation 12/24/19    Authorization Type Medicare/BCBS    Progress Note Due on Visit 10    PT Start Time 1230    PT Stop Time 1303    PT Time Calculation (min) 33 min    Activity Tolerance Patient tolerated treatment well    Behavior During Therapy [facility] for tasks assessed/performed           Past Medical History:  Diagnosis Date  . Allergy    codeine, thiazides  . Anxiety    new dx  . Arthritis   . Breast cancer ([place]) 03/14/12   bx=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,ER/PR=+,upper inner quad  . Cancer ([place])    breast  . Cataract   . DVT (deep venous thrombosis) ([place]) 02/2019   left leg  . Dyspnea   . Edema    both legs feet and toe, abdomen  . Glaucoma    laser treated years ago  . HOH (hard of hearing)   . Hyperlipemia   . Hypertension   . Hypothyroidism   . Pancreatic cyst    benign  . PONV (postoperative nausea and vomiting)   . Pulmonary embolism ([place]) 02/2019   bilateral   . Radiation 06/11/2012-07/12/2012   17 sessions 4250 cGy, 3 sessions 750 cGy  . Vertigo   . Wears glasses   . Wears partial dentures    partial upper    Past Surgical History:  Procedure Laterality Date  . ABDOMINAL HYSTERECTOMY  1966   1/2 ovary left in   . BREAST SURGERY  1992   lumpectomy-lt  . CATARACT EXTRACTION     b/l  . COLONOSCOPY    . EXCISION MASS NECK Left 03/17/2019    EXCISION MASS NECK (Left Neck)  . EXCISION MASS NECK Left 03/17/2019   Procedure: EXCISION MASS NECK;  Surgeon: Helayne Seminole, MD;  Location: [place];  Service: ENT;   Laterality: Left;  . EYE SURGERY Bilateral    bilateral cataract removal  . IR IMAGING GUIDED PORT INSERTION  04/23/2019  . JOINT REPLACEMENT  2013   rt total knee  . JOINT REPLACEMENT  1995   lt total knee  . PARTIAL MASTECTOMY WITH NEEDLE LOCALIZATION  04/09/2012   Procedure: PARTIAL MASTECTOMY WITH NEEDLE LOCALIZATION;  Surgeon: Adin Hector, MD;  Location: [place];  Service: General;  Laterality: Right;  . SKIN BIOPSY Left 03/17/2019   LEFT THIGH  . SKIN BIOPSY Left 03/17/2019   Procedure: Skin Biopsy Left Thigh;  Surgeon: Helayne Seminole, MD;  Location: [place];  Service: ENT;  Laterality: Left;  . TONSILLECTOMY    . TOTAL KNEE ARTHROPLASTY  08/16/2011   Procedure: TOTAL KNEE ARTHROPLASTY;  Surgeon: Ninetta Lights, MD;  Location: [place];  Service: Orthopedics;  Laterality: Right;    There were no vitals filed for this visit.   Subjective Assessment - 11/26/19 1231    Subjective I have had some knee pain since I was here last time.    Currently in Pain? Yes    Pain Score 6  Pain Location Knee    Pain Orientation Left;Right    Pain Descriptors / Indicators Aching    Pain Type Chronic pain    Pain Onset More than a month ago    Pain Frequency Intermittent    Aggravating Factors  standing, walking    Pain Relieving Factors resting                             [facility] Adult PT Treatment/Exercise - 11/26/19 0001      High Level Balance   High Level Balance Comments tandem stance 2x 10 seconds bil each      Knee/Hip Exercises: Aerobic   Nustep Level 2 x 8 minutes- PT present to discuss progress   improved tolerance today      Knee/Hip Exercises: Standing   Hip Abduction Stengthening;Both;2 sets;10 reps    Other Standing Knee Exercises weight shifting on balance pad x1.5 minutes, tandem stance without upper extremity support 3x 20 seconds with close stand by assitance by PT    Other Standing Knee Exercises alternating step-taps on  6" step with min UE support x 20      Knee/Hip Exercises: Seated   Ball Squeeze x20    Marching Strengthening;20 reps;Weights    Sit to [employer] 2 sets;10 reps;Other (comment)   from black pad     Shoulder Exercises: Seated   Flexion Strengthening;Both;20 reps;Weights    Flexion Weight (lbs) 1    Other Seated Exercises overhead press: 1# 2x10                    PT Short Term Goals - 10/22/19 1441      PT SHORT TERM GOAL #1   Title Pt will be independent with HEP within 3 weeks in order to demonstrate autonomy of care.    Baseline pt does not have an HEP    Time 3    Period Weeks    Status New    Target Date 11/19/19             PT Long Term Goals - 10/22/19 1456      PT LONG TERM GOAL #1   Title Pt will improve TUG score to 13.5 seconds or less in order to demonstrate a decreased risk for falls.    Baseline 20 seconds    Time 8    Period Weeks    Status New    Target Date 12/24/19      PT LONG TERM GOAL #2   Title Pt will demonstrate 8x sit to stand w/o use of UE from standard chair height in order to demonstrate improved functional LE strength.    Baseline 7x with intermittent UE use on the legs and on slightly raised plinth table.    Time 8    Period Weeks    Status New    Target Date 12/24/19      PT LONG TERM GOAL #3   Title Pt will report 3/10 pain or less and 50% or greater improvement in funtional mobility and pain with activity within 8 weeks to demonstrate an overall subjective improvement in quality of life.    Baseline 7/10 pain at the knees.    Time 8    Period Weeks    Status New    Target Date 12/24/19                 Plan - 11/26/19 1252  Clinical Impression Statement Pt remains challenged by current level of exercise/activity in the clinic.  Pt did well with 8 minutes on the NuStep and was fatigued with this. LAQs eliminated from routine today due to knee pain.  Pt required close stand by assistance for safety with exercise and  for verbal cueing for technique.  Pt did well with balance exercises and was able to perform weight shifting on foam without UE support.  Pt demonstrated fatigue and session was ended early.  Pt will continue to benefit from skilled PT to address balance and strength deficits associated with lymphoma.    Comorbidities bil knee replacements, lymphoma with on-going treatment (previous chemotherapy currenty antibody therapy)    PT Frequency 2x / week    PT Duration 8 weeks    PT Treatment/Interventions Therapeutic activities;Therapeutic exercise;Neuromuscular re-education;Moist Heat;Manual techniques;Patient/family education    PT Next Visit Plan work on balance activities, core/pelvic and LE strengthening/endurance.    PT Home Exercise Plan Access Code: [email or web address]           Patient will benefit from skilled therapeutic intervention in order to improve the following deficits and impairments:  Abnormal gait, Decreased strength, Decreased balance, Pain  Visit Diagnosis: Abnormality of gait due to impairment of balance  Balance disorder  Chronic pain of left knee  Chronic pain of right knee     Problem List Patient Active Problem List   Diagnosis Date Noted  . Port-A-Cath in place 06/04/2019  . 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[place]) 04/14/2019  . Counseling regarding advance care planning and goals of care 04/14/2019  . Lymphadenopathy of head and neck 03/17/2019  . Lymph nodes enlarged 03/17/2019  . History of pulmonary embolus (PE) 03/17/2019  . Acute pulmonary embolism ([place]) 03/06/2019  . Diffuse lymphadenopathy 03/06/2019  . Pleural effusion 03/06/2019  . Pruritus 02/06/2019  . Pruritic rash 02/06/2019  . Angio-edema 02/06/2019  . Shortness of breath 02/06/2019  . Osteopenia 12/18/2013  . Heart block AV first degree 12/18/2013  . Breast cancer of upper-inner quadrant of right female breast ([place]) 03/19/2012  . Right knee DJD 08/18/2011  . Hypothyroidism   . Hypertension   . PONV  (postoperative nausea and vomiting)   . Hyperlipemia     Sarah [name], PT 11/26/19 1:11 PM   [facility] [address], [place], [place], [address] Phone: [phone]   Fax:  [phone]  Name: Sarah [name] MRN: [id number] Date of Birth: 06-11-1932

## 2019-11-28 ENCOUNTER — Telehealth: Payer: Self-pay | Admitting: Physical Therapy

## 2019-11-28 ENCOUNTER — Encounter: Payer: Medicare Other | Admitting: Physical Therapy

## 2019-11-28 NOTE — Telephone Encounter (Signed)
Patient did not show for today's appointment.  Left message on home #.

## 2019-11-28 NOTE — Progress Notes (Signed)
Subjective:  Patient ID: Sarah [name], female    DOB: 12/14/1932,  MRN: [id number]  86 y.o. female presents with painful thick toenails that are difficult to trim. Pain interferes with ambulation. Aggravating factors include wearing enclosed shoe gear. Pain is relieved with periodic professional debridement.   She is also on the blood thinner, Eliquis. She voices no new pedal problems on today's visit.    Review of Systems: Negative except as noted in the HPI. Denies N/V/F/Ch. Past Medical History:  Diagnosis Date   Allergy    codeine, thiazides   Anxiety    new dx   Arthritis    Breast cancer ([place]) 03/14/12   bx=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,ER/PR=+,upper inner quad   Cancer ([facility])    breast   Cataract    DVT (deep venous thrombosis) ([place]) 02/2019   left leg   Dyspnea    Edema    both legs feet and toe, abdomen   Glaucoma    laser treated years ago   HOH (hard of hearing)    Hyperlipemia    Hypertension    Hypothyroidism    Pancreatic cyst    benign   PONV (postoperative nausea and vomiting)    Pulmonary embolism ([place]) 02/2019   bilateral    Radiation 06/11/2012-07/12/2012   17 sessions 4250 cGy, 3 sessions 750 cGy   Vertigo    Wears glasses    Wears partial dentures    partial upper   Past Surgical History:  Procedure Laterality Date   ABDOMINAL HYSTERECTOMY  1966   1/2 ovary left in    [place]   lumpectomy-lt   CATARACT EXTRACTION     b/l   COLONOSCOPY     EXCISION MASS NECK Left 03/17/2019    EXCISION MASS NECK (Left Neck)   EXCISION MASS NECK Left 03/17/2019   Procedure: EXCISION MASS NECK;  Surgeon: Helayne Seminole, MD;  Location: [place];  Service: ENT;  Laterality: Left;   EYE SURGERY Bilateral    bilateral cataract removal   IR IMAGING GUIDED PORT INSERTION  04/23/2019   JOINT REPLACEMENT  2013   rt total knee   JOINT REPLACEMENT  1995   lt total knee   PARTIAL  MASTECTOMY WITH NEEDLE LOCALIZATION  04/09/2012   Procedure: PARTIAL MASTECTOMY WITH NEEDLE LOCALIZATION;  Surgeon: Adin Hector, MD;  Location: [place];  Service: General;  Laterality: Right;   SKIN BIOPSY Left 03/17/2019   LEFT THIGH   SKIN BIOPSY Left 03/17/2019   Procedure: Skin Biopsy Left Thigh;  Surgeon: Helayne Seminole, MD;  Location: [place];  Service: ENT;  Laterality: Left;   TONSILLECTOMY     TOTAL KNEE ARTHROPLASTY  08/16/2011   Procedure: TOTAL KNEE ARTHROPLASTY;  Surgeon: Ninetta Lights, MD;  Location: [place];  Service: Orthopedics;  Laterality: Right;   Patient Active Problem List   Diagnosis Date Noted   Port-A-Cath in place 06/04/2019   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[place]) 04/14/2019   Counseling regarding advance care planning and goals of care 04/14/2019   Lymphadenopathy of head and neck 03/17/2019   Lymph nodes enlarged 03/17/2019   History of pulmonary embolus (PE) 03/17/2019   Acute pulmonary embolism ([place]) 03/06/2019   Diffuse lymphadenopathy 03/06/2019   Pleural effusion 03/06/2019   Pruritus 02/06/2019   Pruritic rash 02/06/2019   Angio-edema 02/06/2019   Shortness of breath 02/06/2019   Osteopenia 12/18/2013   Heart block AV first degree 12/18/2013   Breast  cancer of upper-inner quadrant of right female breast ([place]) 03/19/2012   Right knee DJD 08/18/2011   Hypothyroidism    Hypertension    PONV (postoperative nausea and vomiting)    Hyperlipemia     Current Outpatient Medications:    amLODipine (NORVASC) 2.5 MG tablet, Take 2.5-5 mg by mouth See admin instructions. 5 mg in the morning, 2.5 mg in the evening, Disp: , Rfl:    apixaban (ELIQUIS) 5 MG TABS tablet, Take 1 tablet (5 mg total) by mouth 2 (two) times daily., Disp: 180 tablet, Rfl: 0   Cholecalciferol (VITAMIN D) 50 MCG (2000 UT) tablet, Take 2,000 Units by mouth 2 (two) times daily., Disp: , Rfl:    ciprofloxacin (CIPRO) 500 MG tablet, Take 1  tablet (500 mg total) by mouth 2 (two) times daily., Disp: 10 tablet, Rfl: 0   Cyanocobalamin (VITAMIN B-12 PO), Take 3,000 mcg by mouth daily. , Disp: , Rfl:    erythromycin ophthalmic ointment, SMARTSIG:0.25 Inch(es) In Eye(s) Every Night, Disp: , Rfl:    gabapentin (NEURONTIN) 100 MG capsule, Take 2 capsules (200 mg total) by mouth at bedtime. (Patient not taking: Reported on 07/16/2019), Disp: 60 capsule, Rfl: 2   hydrocortisone 2.5 % cream, APPLY CREAM TO FACE TWICE DAILY (MORNING AND EVENING ), Disp: , Rfl:    levothyroxine (SYNTHROID) 25 MCG tablet, Take 25 mcg by mouth daily before breakfast. , Disp: , Rfl:    lidocaine-prilocaine (EMLA) cream, Apply to affected area once, Disp: 30 g, Rfl: 3   LORazepam (ATIVAN) 0.5 MG tablet, Take 0.5-1 tablets (0.25-0.5 mg total) by mouth every 8 (eight) hours. (Patient not taking: Reported on 10/22/2019), Disp: 30 tablet, Rfl: 0   Multiple Vitamins-Minerals (PRESERVISION AREDS PO), Take 1 capsule by mouth 2 (two) times daily. , Disp: , Rfl:    ondansetron (ZOFRAN) 4 MG tablet, Take 4 mg by mouth 2 (two) times daily as needed., Disp: , Rfl:    ondansetron (ZOFRAN) 8 MG tablet, Take 1 tablet (8 mg total) by mouth 2 (two) times daily as needed. Start on the third day after chemotherapy., Disp: 30 tablet, Rfl: 1   polyethylene glycol (MIRALAX / GLYCOLAX) 17 g packet, Take 17 g by mouth daily., Disp: , Rfl:    prochlorperazine (COMPAZINE) 10 MG tablet, Take 1 tablet (10 mg total) by mouth every 6 (six) hours as needed (Nausea or vomiting)., Disp: 30 tablet, Rfl: 1   triamcinolone cream (KENALOG) 0.1 %, Apply topically 2 (two) times daily., Disp: , Rfl:    XIIDRA 5 % SOLN, , Disp: , Rfl:    zolpidem (AMBIEN) 10 MG tablet, Take 2.5 mg by mouth at bedtime. , Disp: , Rfl:  Allergies  Allergen Reactions   Codeine Other (See Comments)    Reaction not recalled   Latex    Thiazide-Type Diuretics Other (See Comments)    Blurry vision?? (patient  stated she has macular degeneration)   Social History   Tobacco Use  Smoking Status Never Smoker  Smokeless Tobacco Never Used    Objective:   Constitutional Pt is a pleasant 86 y.o. Caucasian female, WD, WN in NAD.Marland Kitchen  Vascular Neurovascular status unchanged b/l lower extremities. Capillary refill time to digits immediate b/l. Palpable pedal pulses b/l LE. Pedal hair absent. Lower extremity skin temperature gradient within normal limits. No pain with calf compression b/l. No cyanosis or clubbing noted.  Neurologic Normal speech. Oriented to person, place, and time. Protective sensation intact 5/5 intact bilaterally with 10g monofilament b/l.  Vibratory sensation intact b/l. Clonus negative b/l.  Dermatologic Pedal skin with normal turgor, texture and tone bilaterally. No open wounds bilaterally. No interdigital macerations bilaterally. Toenails 2-5 bilaterally and L hallux elongated, discolored, dystrophic, thickened, and crumbly with subungual debris and tenderness to dorsal palpation. Anonychia noted R hallux. Nailbed(s) epithelialized.   Orthopedic: Normal muscle strength 5/5 to all lower extremity muscle groups bilaterally. No pain crepitus or joint limitation noted with ROM b/l. Hammertoes noted to the 2-5 bilaterally.   Radiographs: None Assessment:   1. Pain due to onychomycosis of toenails of both feet    Plan:  Patient was evaluated and treated and all questions answered.  Onychomycosis with pain -Nails palliatively debridement as below. -Educated on self-care  Procedure: Nail Debridement Rationale: Pain Type of Debridement: manual, sharp debridement. Instrumentation: Nail nipper, rotary burr. Number of Nails: 9 -Examined patient. -No new findings. No new orders. -Toenails 2-5 bilaterally and L hallux debrided in length and girth without iatrogenic bleeding with sterile nail nipper and dremel.  -Patient to report any pedal injuries to medical professional  immediately. -Patient to continue soft, supportive shoe gear daily. -Patient/POA to call should there be question/concern in the interim.  Return in about 3 months (around 02/25/2020) for nail trim/ Eliquis.  Marzetta Board, DPM

## 2019-12-01 ENCOUNTER — Ambulatory Visit: Payer: Medicare Other

## 2019-12-01 ENCOUNTER — Other Ambulatory Visit: Payer: Self-pay

## 2019-12-01 DIAGNOSIS — G8929 Other chronic pain: Secondary | ICD-10-CM | POA: Diagnosis not present

## 2019-12-01 DIAGNOSIS — M25561 Pain in right knee: Secondary | ICD-10-CM

## 2019-12-01 DIAGNOSIS — R2689 Other abnormalities of gait and mobility: Secondary | ICD-10-CM

## 2019-12-01 DIAGNOSIS — M25562 Pain in left knee: Secondary | ICD-10-CM | POA: Diagnosis not present

## 2019-12-01 NOTE — Progress Notes (Signed)
HEMATOLOGY/ONCOLOGY CLINIC NOTE  Date of Service: 12/02/2019  Patient Care Team: Lajean Manes, MD as PCP - General (Internal Medicine) Buford Dresser, MD as PCP - Cardiology (Cardiology)  REFERRING PHYSICIAN: Lajean Manes, MD  CHIEF COMPLAINTS/PURPOSE OF CONSULTATION:  Continue mx of Recently Diagnosed Angioimmunoblastic T cell lymphoma      HISTORY OF PRESENTING ILLNESS:   Sarah [name] is a wonderful 86 y.o. female who has been referred to [place] by Lajean Manes, MD for evaluation and management of Possible lymphoma . She is accompanied today by [name] . The pt reports that she is doing well overall.   Pending Surgical pathology from 03/17/2019 Dr.Manny will contact clinic about pathology   August 25th/27th she began coughing and sneezing. On Sept 3 went to doctors and they said it was allergies. She began having rashes (little red dots). Went to PCP and they prescribed prednisone and for several days and the medication helped. But as soon as she completed the prescription symptoms reoccurred.   She had a chest X Ray done because she was having a severe cough on 02/17/2019 revealing "bilateral effusions in this patient with history of breast cancer,of uncertain significance with question of right lower lobe pulmonary nodule, consider chest CT for further assessment. Atherosclerotic changes in the thoracic aorta."  Dr. Amy Martinique biopsied the rashes on her back 02/21/2019 and it was determined that it could be because of an allergy to medication. Dr.Stoneking stopped her blood pressure medication from September- October but ther rash still present. The itching started 1 to 2 months ago. The rash would come and go and was mostly on her back. She still has rash on her left thigh.  She is using triaconazole and cetrizine.  She has lumps behind ear and abdomin and she states that they have swollen twice the size within 1 and half months  She has no appetite and  has changes in her taste that began around August. She has changes in bowels. They are more loose and smaller In size. She also has no control over bowels.  She feels fatigued and it has increased since August.   She has a cough that starts in September  that has made her horse.   She had a CT scan on 02/27/2019 because she was having severe cough and her stomach was very extended. Revealing "1. Extensive lymphadenopathy throughout the chest, abdomen, and pelvis, with index lymph nodes identified above. Splenomegaly. Findings are most consistent with lymphoma with recurrent, metastatic breast malignancy less favored. 2. There is generally osteopenic appearance of the skeleton. There are numerous subtle hypodense lesions, particularly of the pelvis (e.g. Series 2, image 88) and select vertebral bodies, for example L4 (series 8, image 92). This is suspicious although not definitive for osseous metastatic disease. Characterization for metabolic activity by nuclear scintigraphic bone scan or PET-CT would be helpful to further evaluate. 3. Moderate right, small left pleural effusions with associated atelectasis or consolidation. Subpleural radiation fibrosis of the anterior right lung. 4. There is a new subpleural pulmonary nodule of the anterior right middle lobe measuring 6 mm (series 4, image 105), nonspecific. There is no obvious pleural thickening or nodularity definitive for metastatic disease. 5.  Small volume ascites. 6. There is a fluid attenuation lesion of the pancreatic uncinate measuring 2.4 x 1.3 cm (series 2, image 63), not substantially changed when compared to remote prior MR examination dated 02/23/2012 and likely sequelae of a prior pseudocyst versus incidental pancreatic IPMN. 7.  Cardiomegaly."  Hospitalized from 03/06/2019-03/08/2019. She presenting with acute shortness of breath, leg swelling,left hand swelling and some weight gain despite poor appetite. In ED, CTA chest revealed at least  submassive PE, moderate right pleural effusion, diffuse LAD concerning for lymphoma. Started on heparin drip. PCCM consulted for guidance. She had thoracocentesis with removal of 1200 cc cloudy exudative pleural fluid. Cardiology consulted for elevated which was thought to be demand ischemia from right heart strain in the setting of PE. Patient was transitioned to subcu Lovenox by pulmonology the next day and remained stable. Unusual appearance of tracheal air column at the thoracic inlet. Correlate with any history of swallowing difficulty or changes in voice with dedicated imaging with chest CT and or CT of the neck as indicated.  She has a nonhealing ulcer on her right tibialis anterior that starting weeping one week ago. Of note since the patient's last visit, pt has had CHEST XRAY - 2 VIEW (Accession [id number]) completed on 02/17/2019 with results revealing "Bilateral effusions in this patient with history of breast cancer,of uncertain significance with question of right lower lobepulmonary nodule, consider chest CT for furtherassessment.Unusual appearance of tracheal air column at the thoracic inlet.Correlate with any history of swallowing difficulty or changes in voice with dedicated imaging with chest CT and or CT of the neck as Indicated. Atherosclerotic changes in the thoracic aorta."  Of note since the patient's last visit, pt has had CT CHEST, ABDOMEN, AND PELVIS WITH CONTRAST (Accession [id number]) completed on 02/27/2019 with results revealing "1. Extensive lymphadenopathy throughout the chest, abdomen, and pelvis, with index lymph nodes identified above. Splenomegaly.Findings are most consistent with lymphoma with recurrent, metastatic breast malignancy less favored. 2. There is generally osteopenic appearance of the skeleton. There are numerous subtle hypodense lesions, particularly of the pelvis (e.g. Series 2, image 88) and select vertebral bodies, for example L4 (series 8, image 92).  This is suspicious although not definitive for osseous metastatic disease. Characterization for metabolic activity by nuclear scintigraphic bone scan or PET-CT would be helpful to further evaluate.  3. Moderate right, small left pleural effusions with associated atelectasis or consolidation. Subpleural radiation fibrosis of the anterior right lung.4. There is a new subpleural pulmonary nodule of the anterior right middle lobe measuring 6 mm (series 4, image 105), nonspecific. There is no obvious pleural thickening or nodularity definitive for metastatic disease.5.  Small volume ascites. 6. There is a fluid attenuation lesion of the pancreatic uncinate measuring 2.4 x 1.3 cm (series 2, image 63), not substantially changed when compared to remote prior MR examination dated 02/23/2012 and likely sequelae of a prior pseudocyst versus incidental pancreatic IPMN.7.  Cardiomegaly.8. Other chronic and incidental findings as detailed above. Aortic Atherosclerosis (ICD10-I70.0)."  Of note since the patient's last visit, pt has had PORTABLE CHEST 1 VIEW (Accession [id number]) completed on 03/06/2019 with results revealing "1. Moderate size right and small left pleural effusions appear not significantly changed since 02/27/19. 2. No new cardiopulmonary abnormality."  Of note since the patient's last visit, pt has had ECHOCARDIOGRAM  completed on 03/06/2019 with results revealing  "1. Left ventricular ejection fraction, by visual estimation, is 60 to 65%. The left ventricle has normal function. Normal left ventricular size. There is no left ventricular hypertrophy. 2. Left ventricular diastolic Doppler parameters are consistent with impaired relaxation pattern of LV diastolic filling. 3. Global right ventricle has mildly reduced systolic function.The right ventricular size is moderately enlarged. No increase in right ventricular wall thickness. 4. Left atrial size was normal.  5. Right atrial size was mildly dilated.  6. Trivial pericardial effusion is present. 7. The mitral valve is normal in structure. Trace mitral valve regurgitation. No evidence of mitral stenosis. 8. The tricuspid valve is normal in structure. Tricuspid valve regurgitation severe. 9. The aortic valve is normal in structure. Aortic valve regurgitation was not visualized by color flow Doppler. Structurally normal aortic valve, with no evidence of sclerosis or stenosis.10. The pulmonic valve was normal in structure. Pulmonic valve regurgitation is mild by color flow Doppler.11. Mildly elevated pulmonary artery systolic pressure.12. The inferior vena cava is normal in size with greater than 50% respiratory variability, suggesting right atrial pressure of 3 mmHg."  Of note since the patient's last visit, pt has had CT ANGIOGRAPHY CHEST WITH CONTRAST (Accession [id number]) completed on 03/06/2019 with results revealing "1. Pulmonary embolus arising from the distal left main pulmonary artery with extension into multiple left lower lobe pulmonary arterial branches. There is also incompletely obstructing pulmonary embolus in the proximal left upper lobe pulmonary artery. More distal pulmonary embolus in the right lower lobe pulmonary artery. Positive for acute PE with CT evidence of right heart strain (RV/LV Ratio = 1.6) consistent with at least submassive (intermediate risk) PE. The presence of right heart strain has been associated with an increased risk of morbidity and mortality. Please activate Code PE by paging [phone]. 2. Sizable pleural effusion on the right with smaller pleural effusion on the left. Consolidation and compressive atelectasis throughout most of the right lower lobe. Milder atelectasis left base. Nodular opacities on the right, likely metastatic foci.3.  Stable adenopathy compared to 1 week prior.4. Enlargement of spleen, incompletely visualized but documented CT 1 week prior. Concern for potential lymphoma. 5. Aortic atherosclerosis.  No aneurysm or dissection evident. Foci of coronary artery calcification noted."  Most recent lab results (03/06/2019) of CBC is as follows: all values are WNL except for Platelets at 72, Eosinophils Absolute at 0.6,Abs Immature Granulocytes at 0.08 .  On review of systems, pt reports rashes, digestive issues and denies back pain, abdominal pain and any other symptoms.   On PMHx the pt reports Hypothyroidism, MCI, Hypercholesterolemia, Tracheal anomaly, Hypertension, Pulmonary nodule, Atherosclerosis of aorta, anxiety, gait disorder, primary insomnia.   INTERVAL HISTORY:  Sarah [name] is a 86 y.o. female here for evaluation and management of Angioimmunoblastic T-cell lymphoma. We are joined today by her daughter, [name]. The patient's last visit with [place] was on 11/10/2019. The pt reports that she is doing well overall.  The pt reports she is feeling more anxiety. Pt has taken Ativan sporadically. She also notes that she has more under eye puffiness and denies seasonal allergies. She is not currently experiencing much of a rash. Pt claims that she has been eating well and maintaining herself nutritionally. Pt has an upcoming appointment with the Dentist for a cleaning.   Of note since the patient's last visit, pt has had PET/CT ([id number]) completed on 11/25/2019 with results revealing "1. Marked interval improvement in the hypermetabolic disease identified previously in the posterior right nasopharynx, compatible with Deauville 4 today. 2. Decreased hypermetabolism in the right hilar and porta hepatis lymph nodes identified previously ( Deauville 3). No new sites of hypermetabolic disease in the chest, abdomen, or pelvis. 3. Similar diffuse thyroid uptake, left greater than right. 4. No substantial change in right-sided pulmonary nodules. Continued attention on follow-up recommended. 5.  Aortic Atherosclerois (ICD10-170.0)."  Lab results today (12/02/19) of CBC w/diff and CMP is as follows: all  values  are WNL except for WBC at 3.6K, Lymphs Abs at 0.6K, AST at 13. 12/02/2019 LDH at 193  On review of systems, pt reports anxiety, under eye puffiness and denies rash, difficulty swallowing, abdominal pain, coughing, low appetite, unexpected weight loss and any other symptoms.    MEDICAL HISTORY:  Past Medical History:  Diagnosis Date  . Allergy    codeine, thiazides  . Anxiety    new dx  . Arthritis   . Breast cancer ([place]) 03/14/12   bx=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,ER/PR=+,upper inner quad  . Cancer ([place])    breast  . Cataract   . DVT (deep venous thrombosis) ([place]) 02/2019   left leg  . Dyspnea   . Edema    both legs feet and toe, abdomen  . Glaucoma    laser treated years ago  . HOH (hard of hearing)   . Hyperlipemia   . Hypertension   . Hypothyroidism   . Pancreatic cyst    benign  . PONV (postoperative nausea and vomiting)   . Pulmonary embolism ([place]) 02/2019   bilateral   . Radiation 06/11/2012-07/12/2012   17 sessions 4250 cGy, 3 sessions 750 cGy  . Vertigo   . Wears glasses   . Wears partial dentures    partial upper     SURGICAL HISTORY: Past Surgical History:  Procedure Laterality Date  . ABDOMINAL HYSTERECTOMY  1966   1/2 ovary left in   . BREAST SURGERY  1992   lumpectomy-lt  . CATARACT EXTRACTION     b/l  . COLONOSCOPY    . EXCISION MASS NECK Left 03/17/2019    EXCISION MASS NECK (Left Neck)  . EXCISION MASS NECK Left 03/17/2019   Procedure: EXCISION MASS NECK;  Surgeon: Helayne Seminole, MD;  Location: [facility];  Service: ENT;  Laterality: Left;  . EYE SURGERY Bilateral    bilateral cataract removal  . IR IMAGING GUIDED PORT INSERTION  04/23/2019  . JOINT REPLACEMENT  2013   rt total knee  . JOINT REPLACEMENT  1995   lt total knee  . PARTIAL MASTECTOMY WITH NEEDLE LOCALIZATION  04/09/2012   Procedure: PARTIAL MASTECTOMY WITH NEEDLE LOCALIZATION;  Surgeon: Adin Hector, MD;  Location: [facility];  Service: General;  Laterality: Right;  . SKIN BIOPSY Left 03/17/2019   LEFT THIGH  . SKIN BIOPSY Left 03/17/2019   Procedure: Skin Biopsy Left Thigh;  Surgeon: Helayne Seminole, MD;  Location: [facility];  Service: ENT;  Laterality: Left;  . TONSILLECTOMY    . TOTAL KNEE ARTHROPLASTY  08/16/2011   Procedure: TOTAL KNEE ARTHROPLASTY;  Surgeon: Ninetta Lights, MD;  Location: [facility];  Service: Orthopedics;  Laterality: Right;     SOCIAL HISTORY: Social History   Socioeconomic History  . Marital status: Widowed    Spouse name: Not on file  . Number of children: 2  . Years of education: Not on file  . Highest education level: Not on file  Occupational History  . Occupation: Retired    Comment: Community education officer   Tobacco Use  . Smoking status: Never Smoker  . Smokeless tobacco: Never Used  Vaping Use  . Vaping Use: Never used  Substance and Sexual Activity  . Alcohol use: No  . Drug use: No  . Sexual activity: Not Currently  Other Topics Concern  . Not on file  Social History Narrative  . Not on file   Social Determinants of Health   Financial Resource  Strain:   . Difficulty of Paying Living Expenses:   Food Insecurity:   . Worried About Charity fundraiser in the Last Year:   . Arboriculturist in the Last Year:   Transportation Needs:   . Film/video editor (Medical):   Marland Kitchen Lack of Transportation (Non-Medical):   Physical Activity:   . Days of Exercise per Week:   . Minutes of Exercise per Session:   Stress:   . Feeling of Stress :   Social Connections:   . Frequency of Communication with Friends and Family:   . Frequency of Social Gatherings with Friends and Family:   . Attends Religious Services:   . Active Member of Clubs or Organizations:   . Attends Archivist Meetings:   Marland Kitchen Marital Status:   Intimate Partner Violence:   . Fear of Current or Ex-Partner:   . Emotionally Abused:   Marland Kitchen Physically Abused:   . Sexually Abused:      FAMILY  HISTORY: Family History  Problem Relation Age of Onset  . Heart disease Father   . Cancer Maternal Aunt        stomach     ALLERGIES:   is allergic to codeine, latex, and thiazide-type diuretics.   MEDICATIONS:  Current Outpatient Medications  Medication Sig Dispense Refill  . amLODipine (NORVASC) 2.5 MG tablet Take 2.5-5 mg by mouth See admin instructions. 5 mg in the morning, 2.5 mg in the evening    . amoxicillin (AMOXIL) 500 MG capsule Take 4 capsules (2,000 mg total) by mouth once as needed for up to 1 dose. 60 mins prior to dental procedure 4 capsule 0  . apixaban (ELIQUIS) 5 MG TABS tablet Take 1 tablet (5 mg total) by mouth 2 (two) times daily. 180 tablet 0  . Cholecalciferol (VITAMIN D) 50 MCG (2000 UT) tablet Take 2,000 Units by mouth 2 (two) times daily.    . ciprofloxacin (CIPRO) 500 MG tablet Take 1 tablet (500 mg total) by mouth 2 (two) times daily. 10 tablet 0  . citalopram (CELEXA) 10 MG tablet Take 1 tablet (10 mg total) by mouth daily. 30 tablet 1  . Cyanocobalamin (VITAMIN B-12 PO) Take 3,000 mcg by mouth daily.     Marland Kitchen erythromycin ophthalmic ointment SMARTSIG:0.25 Inch(es) In Eye(s) Every Night    . gabapentin (NEURONTIN) 100 MG capsule Take 2 capsules (200 mg total) by mouth at bedtime. (Patient not taking: Reported on 07/16/2019) 60 capsule 2  . hydrocortisone 2.5 % cream APPLY CREAM TO FACE TWICE DAILY (MORNING AND EVENING )    . levothyroxine (SYNTHROID) 25 MCG tablet Take 25 mcg by mouth daily before breakfast.     . lidocaine-prilocaine (EMLA) cream Apply to affected area once 30 g 3  . LORazepam (ATIVAN) 0.5 MG tablet Take 0.5-1 tablets (0.25-0.5 mg total) by mouth every 8 (eight) hours. (Patient not taking: Reported on 10/22/2019) 30 tablet 0  . Multiple Vitamins-Minerals (PRESERVISION AREDS PO) Take 1 capsule by mouth 2 (two) times daily.     . ondansetron (ZOFRAN) 4 MG tablet Take 4 mg by mouth 2 (two) times daily as needed.    . ondansetron (ZOFRAN) 8 MG  tablet Take 1 tablet (8 mg total) by mouth 2 (two) times daily as needed. Start on the third day after chemotherapy. 30 tablet 1  . polyethylene glycol (MIRALAX / GLYCOLAX) 17 g packet Take 17 g by mouth daily.    . prochlorperazine (COMPAZINE) 10 MG tablet Take  1 tablet (10 mg total) by mouth every 6 (six) hours as needed (Nausea or vomiting). 30 tablet 1  . triamcinolone cream (KENALOG) 0.1 % Apply topically 2 (two) times daily.    Marland Kitchen XIIDRA 5 % SOLN     . zolpidem (AMBIEN) 10 MG tablet Take 2.5 mg by mouth at bedtime.      No current facility-administered medications for this visit.     REVIEW OF SYSTEMS:   A 10+ POINT REVIEW OF SYSTEMS WAS OBTAINED including neurology, dermatology, psychiatry, cardiac, respiratory, lymph, extremities, GI, GU, Musculoskeletal, constitutional, breasts, reproductive, HEENT.  All pertinent positives are noted in the HPI.  All others are negative.   PHYSICAL EXAMINATION: ECOG FS:1 - Symptomatic but completely ambulatory  Vitals:   12/02/19 0920 12/02/19 0925  BP: (!) 152/86 (!) 146/82  Pulse: 96   Resp: 17   Temp: 97.7 F (36.5 C)   SpO2: 97%    Wt Readings from Last 3 Encounters:  12/02/19 116 lb 4.8 oz (52.8 kg)  11/10/19 117 lb 3.2 oz (53.2 kg)  10/29/19 115 lb 6.4 oz (52.3 kg)   Body mass index is 19.96 kg/m.    Exam was given in a chair   GENERAL:alert, in no acute distress and comfortable SKIN: no acute rashes, no significant lesions EYES: conjunctiva are pink and non-injected, sclera anicteric OROPHARYNX: MMM, no exudates, no oropharyngeal erythema or ulceration NECK: supple, no JVD LYMPH:  no palpable lymphadenopathy in the cervical, axillary or inguinal regions LUNGS: clear to auscultation b/l with normal respiratory effort HEART: regular rate & rhythm ABDOMEN:  normoactive bowel sounds , non tender, not distended. No palpable hepatosplenomegaly.  Extremity: minimal pedal edema PSYCH: alert & oriented x 3 with fluent  speech NEURO: no focal motor/sensory deficits  LABORATORY DATA:  I have reviewed the data as listed  CBC Latest Ref Rng & Units 12/02/2019 11/10/2019 10/21/2019  WBC 4.0 - 10.5 K/uL 3.6(L) 4.6 5.1  Hemoglobin 12.0 - 15.0 g/dL 12.9 12.3 13.7  Hematocrit 36 - 46 % 39.8 38.0 42.7  Platelets 150 - 400 K/uL 192 191 187    CMP Latest Ref Rng & Units 12/02/2019 11/10/2019 10/21/2019  Glucose 70 - 99 mg/dL 84 118(H) 80  BUN 8 - 23 mg/dL 18 22 17   Creatinine 0.44 - 1.00 mg/dL 0.67 0.74 0.72  Sodium 135 - 145 mmol/L 142 143 141  Potassium 3.5 - 5.1 mmol/L 3.8 3.8 4.5  Chloride 98 - 111 mmol/L 107 107 105  CO2 22 - 32 mmol/L 26 26 25   Calcium 8.9 - 10.3 mg/dL 9.6 9.5 9.5  Total Protein 6.5 - 8.1 g/dL 6.6 6.4(L) 6.8  Total Bilirubin 0.3 - 1.2 mg/dL 0.5 0.4 0.5  Alkaline Phos 38 - 126 U/L 66 77 76  AST 15 - 41 U/L 13(L) 14(L) 13(L)  ALT 0 - 44 U/L 12 13 11    Component     Latest Ref Rng & Units 03/27/2019  Hepatitis B Surface Ag     NON REACTIVE NON REACTIVE  Hep B Core Total Ab     NON REACTIVE NON REACTIVE  HCV Ab     NON REACTIVE NON REACTIVE  LDH     98 - 192 U/L 252 (H)   . Lab Results  Component Value Date   LDH 193 (H) 12/02/2019      04/08/2019 NM PET Image Initial (PI) Skull Base To Thigh (Accession [id number])  04/04/2019 PATHOLOGY   04/07/2019 ECHOCARDIOGRAM  03/28/2019 PATHOLOGY  03/06/2019 CT ANGIOGRAPHY CHEST WITH CONTRAST (Accession [id number])  04/07/2019 ECHOCARDIOGRAM  03/06/2019 ECHOCARDIOGRAM     03/06/2019 PORTABLE CHEST 1 VIEW (Accession [id number])    02/27/2019 CT CHEST, ABDOMEN, AND PELVIS WITH CONTRAST (Accession [id number])    02/17/2019 CHEST XRAY - 2 VIEW (Accession [id number])    RADIOGRAPHIC STUDIES: I have personally reviewed the radiological images as listed and agreed with the findings in the report. NM PET Image Restag (PS) Skull Base To Thigh  Result Date: 11/25/2019 CLINICAL DATA:  Subsequent treatment strategy for T-cell  lymphoma. EXAM: NUCLEAR MEDICINE PET SKULL BASE TO THIGH TECHNIQUE: 6.6 mCi F-18 FDG was injected intravenously. Full-ring PET imaging was performed from the skull base to thigh after the radiotracer. CT data was obtained and used for attenuation correction and anatomic localization. Fasting blood glucose: 89 mg/dl COMPARISON:  09/16/2019 FINDINGS: Mediastinal blood pool activity: SUV max 1.7 Liver activity: SUV max 3.0 NECK: Asymmetric hypermetabolism study has seen in the posterior right almost completely nasopharynx on the previous resolved with SUV max = 3.6 today compared to 18 previously. The soft tissue fullness in this region has decreased on CT imaging. Similar diffuse thyroid uptake, left greater than right. Incidental CT findings: none CHEST: Hypermetabolism in the right hilar lymph node is markedly improved with SUV max = 2.5 today compared to 5.7 previously. No new suspicious hypermetabolic disease in the chest. Incidental CT findings: Right Port-A-Cath tip is positioned at the SVC/RA junction. Surgical changes noted right breast. Coronary artery calcification is evident. Atherosclerotic calcification is noted in the wall of the thoracic aorta. 6 mm right middle lobe perifissural nodule on 69/4 is stable. The subpleural 6 mm right lower lobe nodule (78/4) was 5 mm previously. No hypermetabolism. ABDOMEN/PELVIS: The hypermetabolic hepatoduodenal ligament lymph node seen on the previous study has resolved with no hypermetabolic focus in this region above background soft tissue levels on today's study. No new hypermetabolic disease in the abdomen or pelvis. Incidental CT findings: Large left renal cyst again noted. There is abdominal aortic atherosclerosis without aneurysm. Diverticular changes noted left colon without diverticulitis. Large stool volume evident. SKELETON: No focal hypermetabolic activity to suggest skeletal metastasis. Incidental CT findings: No worrisome lytic or sclerotic osseous  abnormality. Sclerotic lesion upper thoracic vertebral body is stable without hypermetabolism. IMPRESSION: 1. Marked interval improvement in the hypermetabolic disease identified previously in the posterior right nasopharynx, compatible with Deauville 4 today. 2. Decreased hypermetabolism in the right hilar and porta hepatis lymph nodes identified previously ( Deauville 3). No new sites of hypermetabolic disease in the chest, abdomen, or pelvis. 3. Similar diffuse thyroid uptake, left greater than right. 4. No substantial change in right-sided pulmonary nodules. Continued attention on follow-up recommended. 5.  Aortic Atherosclerois (ICD10-170.0) Electronically Signed   By: Misty Stanley M.D.   On: 11/25/2019 14:49     ASSESSMENT & PLAN:   MAURICIA MERTENS is a 86 y.o. female with:  1. 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 SKIN, LEFT THIGH, BIOPSY: - Involvement by angioimmunoblastic T-cell lymphoma." -04/07/2019 Echocardiogram - normal EF -04/08/2019 NM PET Image Initial (PI) Skull Base To Thigh (Accession [id number]) revealed "1. Widespread hypermetabolic adenopathy in the neck, chest, and abdomen/pelvis, primarily in the Deauville 4 and Deauville 5 range. There is also hypermetabolic activity along the posterior nasopharynx in lingual tonsillar regions potentially indicating sites of involvement. 2. The subtle hypodensities in the spine and bony pelvis  are not appreciably hypermetabolic may be incidental, surveillance is suggested. 3. Bilateral thyroid activity but especially on the left. Probably from thyroiditis. 4. A 6 mm subpleural nodule anteriorly in the right lower lobe not appreciably hypermetabolic but is below sensitive PET-CT size thresholds and merits surveillance. 5. Mildly accentuated diffuse splenic activity without  splenomegaly or focal splenic lesion identified. 6. Other imaging findings of potential clinical significance: Aortic Atherosclerosis (ICD10-I70.0). Coronary atherosclerosis. Large right and small left pleural effusions. Moderate cardiomegaly. Small pericardial effusion. Mild mesenteric edema. Large left renal cyst. Fullness of both renal collecting systems with suspected nonobstructive left nephrolithiasis. Degenerative grade 1 anterolisthesis at L4-5 at L5-S1. Trace pelvic ascites." -05/28/2019 PET/CT ([id number]) revealed "Complete metabolic response to therapy". -09/16/2019 PET/CT ([id number]) revealed "1. Unfortunately evidence of lymphoma recurrence at sites of previous hypermetabolic adenopathy. Hypermetabolic lymphoid tissue is new from comparison PET-CT 05/28/2019 but at same locations as PET-CT 04/08/2019. The number of metastatic lymph nodes is less than 04/08/2019. Lymphoid tissue at the RIGHT skull base is larger. 2. Three foci of hypermetabolic recurrence are present, lymphoid tissue at the RIGHT base of skull, hypermetabolic RIGHT hilar lymph node and hypermetabolic lymph node in the porta hepatis."  2. Pulmonary Embolism -extensive .likely related to extensive malignancy  3. H/o Breast Cancer  -The patient had bilateral diagnostic  mammography at [facility] 07/11/2011. This  showed some calcifications in the right  breast, which seemed a little bit more  prominent than prior. In the left breast  there was an area of possible  architectural distortion. However left  breast ultrasound the same day showed  no abnormality. 6 month followup was  suggested, and on 02/28/2012 the  patient again had bilateral diagnostic  mammography, now with right  ultrasonography. The microcalcifications  in the right breast appeared increased.  Ultrasound showed a hypoechoic lesion  measuring 5 mm, with no associated  shadowing. This had been previously  noted and appeared unchanged. Anterior  to that there was a small  hypoechoic  mass measuring 4 mm in diameter.  There was felt to be suspicious, and on  03/14/2012 the patient underwent biopsy  of the right breast mass, showing ([id number]  [id number]) ductal carcinoma in situ,  intermediate grade, estrogen receptor  100% and progesterone receptor 100%  positive.   -Bilateral breast MRI was obtained  03/26/2012. This showed only post  biopsy changes in the right breast,  associated with an area of non-masslike  enhancement measuring 2.4 cm. The  left breast was unremarkable, and there  was no enlarged axillary or internal  mammary adenopathy noted.  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 The patient's  subsequent history is as detailed below.   PLAN: -Discussed pt labwork today, 12/02/19; blood counts and chemistries are stable, LDH is okay  -Discussed 11/25/2019 PET/CT ([id number]) which revealed "1. Marked interval improvement in the hypermetabolic disease identified previously in the posterior right nasopharynx, compatible with Deauville 4 today. 2. Decreased hypermetabolism in the right hilar and porta hepatis lymph nodes identified previously ( Deauville 3). No new sites of hypermetabolic disease in the chest, abdomen, or pelvis. 3. Similar diffuse thyroid uptake, left greater than right. 4. No substantial change in right-sided pulmonary nodules." -Based on scan and symptoms Brentuximab appears to be holding disease for now. Will continue until progression or intolerance.  -The pt has  no prohibitive toxicities from continuing C4 Brentuximab at this time. -Will begin pt on low-level dose of Celexa. Advised pt to continue Ativan prn.  -Advised pt that Celexa can cause nausea for the first 2-3 days. Recommend pt take with food and use nausea medication prn.  -Will send single-dose antibiotics to prevent  infection after dental cleaning  -Rx Celexa, Amoxicillin -Continue B-complex Vitamin  -Will see back in 3 weeks with labs   FOLLOW-UP: Plz schedule next cycle of Brentuximab-vedotin with portflush, labs and MD visit in 3 weeks   The total time spent in the appt was 30 minutes and more than 50% was on counseling and direct patient cares, ordering and mx of Brentuxmab-Vedotin  All of the patient's questions were answered with apparent satisfaction. The patient knows to call the clinic with any problems, questions or concerns.    Sullivan Lone MD [place] AAHIVMS [facility] Hematology/Oncology Physician [facility]  (Office):       [phone] (Work cell):  [phone] (Fax):           [phone]  12/02/2019 10:15 AM    I, Yevette Edwards, am acting as a scribe for Dr. Sullivan Lone.   .I have reviewed the above documentation for accuracy and completeness, and I agree with the above. Brunetta Genera MD

## 2019-12-01 NOTE — Therapy (Signed)
[facility] [address] [place], [place], [address] Phone: [phone]   Fax:  [phone]  Physical Therapy Treatment  Patient Details  Name: RAYME BUI MRN: [id number] Date of Birth: April 10, 1933 Referring Provider (PT): Sullivan Lone MD   Encounter Date: 12/01/2019   PT End of Session - 12/01/19 1514    Visit Number 7    Date for PT Re-Evaluation 12/24/19    Authorization Type Medicare/BCBS    Progress Note Due on Visit 10    PT Start Time 8466    PT Stop Time 1520    PT Time Calculation (min) 34 min    Activity Tolerance Patient limited by fatigue    Behavior During Therapy [facility] for tasks assessed/performed           Past Medical History:  Diagnosis Date  . Allergy    codeine, thiazides  . Anxiety    new dx  . Arthritis   . Breast cancer ([place]) 03/14/12   bx=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,ER/PR=+,upper inner quad  . Cancer ([place])    breast  . Cataract   . DVT (deep venous thrombosis) ([place]) 02/2019   left leg  . Dyspnea   . Edema    both legs feet and toe, abdomen  . Glaucoma    laser treated years ago  . HOH (hard of hearing)   . Hyperlipemia   . Hypertension   . Hypothyroidism   . Pancreatic cyst    benign  . PONV (postoperative nausea and vomiting)   . Pulmonary embolism ([place]) 02/2019   bilateral   . Radiation 06/11/2012-07/12/2012   17 sessions 4250 cGy, 3 sessions 750 cGy  . Vertigo   . Wears glasses   . Wears partial dentures    partial upper    Past Surgical History:  Procedure Laterality Date  . ABDOMINAL HYSTERECTOMY  1966   1/2 ovary left in   . BREAST SURGERY  1992   lumpectomy-lt  . CATARACT EXTRACTION     b/l  . COLONOSCOPY    . EXCISION MASS NECK Left 03/17/2019    EXCISION MASS NECK (Left Neck)  . EXCISION MASS NECK Left 03/17/2019   Procedure: EXCISION MASS NECK;  Surgeon: Helayne Seminole, MD;  Location: [place];  Service: ENT;   Laterality: Left;  . EYE SURGERY Bilateral    bilateral cataract removal  . IR IMAGING GUIDED PORT INSERTION  04/23/2019  . JOINT REPLACEMENT  2013   rt total knee  . JOINT REPLACEMENT  1995   lt total knee  . PARTIAL MASTECTOMY WITH NEEDLE LOCALIZATION  04/09/2012   Procedure: PARTIAL MASTECTOMY WITH NEEDLE LOCALIZATION;  Surgeon: Adin Hector, MD;  Location: [place];  Service: General;  Laterality: Right;  . SKIN BIOPSY Left 03/17/2019   LEFT THIGH  . SKIN BIOPSY Left 03/17/2019   Procedure: Skin Biopsy Left Thigh;  Surgeon: Helayne Seminole, MD;  Location: [place];  Service: ENT;  Laterality: Left;  . TONSILLECTOMY    . TOTAL KNEE ARTHROPLASTY  08/16/2011   Procedure: TOTAL KNEE ARTHROPLASTY;  Surgeon: Ninetta Lights, MD;  Location: [place];  Service: Orthopedics;  Laterality: Right;    There were no vitals filed for this visit.   Subjective Assessment - 12/01/19 1450    Subjective I am feeling weak and tired the past week due to Lymphoma.    Currently in Pain? No/denies  [facility] Adult PT Treatment/Exercise - 12/01/19 0001      High Level Balance   High Level Balance Comments tandem stance 2x 10 seconds bil each      Knee/Hip Exercises: Aerobic   Nustep Level 2 x 8 minutes- PT present to discuss progress   improved tolerance today      Knee/Hip Exercises: Standing   Hip Abduction Stengthening;Both;2 sets;10 reps    Other Standing Knee Exercises weight shifting on balance pad x1.5 minutes, tandem stance without upper extremity support 3x 20 seconds with close stand by assitance by PT    Other Standing Knee Exercises --      Knee/Hip Exercises: Seated   Ball Squeeze x20    Marching Strengthening;20 reps;Weights      Shoulder Exercises: Seated   Flexion Strengthening;Both;20 reps;Weights    Flexion Weight (lbs) 1    Other Seated Exercises overhead press: 1# 2x10    Other Seated Exercises biceps curls 1#  2x10                    PT Short Term Goals - 10/22/19 1441      PT SHORT TERM GOAL #1   Title Pt will be independent with HEP within 3 weeks in order to demonstrate autonomy of care.    Baseline pt does not have an HEP    Time 3    Period Weeks    Status New    Target Date 11/19/19             PT Long Term Goals - 10/22/19 1456      PT LONG TERM GOAL #1   Title Pt will improve TUG score to 13.5 seconds or less in order to demonstrate a decreased risk for falls.    Baseline 20 seconds    Time 8    Period Weeks    Status New    Target Date 12/24/19      PT LONG TERM GOAL #2   Title Pt will demonstrate 8x sit to stand w/o use of UE from standard chair height in order to demonstrate improved functional LE strength.    Baseline 7x with intermittent UE use on the legs and on slightly raised plinth table.    Time 8    Period Weeks    Status New    Target Date 12/24/19      PT LONG TERM GOAL #3   Title Pt will report 3/10 pain or less and 50% or greater improvement in funtional mobility and pain with activity within 8 weeks to demonstrate an overall subjective improvement in quality of life.    Baseline 7/10 pain at the knees.    Time 8    Period Weeks    Status New    Target Date 12/24/19                 Plan - 12/01/19 1500    Clinical Impression Statement Pt remains challenged by current level of exercise/activity in the clinic. Pt has been more fatigued due to side effects of lymphoma.  Pt did well with 8 minutes on the NuStep and was fatigued with this.  Pt required close stand by assistance for safety with exercise and for verbal cueing for technique.  Pt did well with balance exercises and was able to perform weight shifting on foam without UE support.  Pt was fatigued at the end of session.  Pt will continue to benefit from skilled PT to address  balance and strength deficits associated with lymphoma.    PT Frequency 2x / week    PT Duration 8 weeks     PT Treatment/Interventions Therapeutic activities;Therapeutic exercise;Neuromuscular re-education;Moist Heat;Manual techniques;Patient/family education    PT Next Visit Plan work on balance activities, core/pelvic and LE strengthening/endurance.    PT Home Exercise Plan Access Code: [email or web address]    Consulted and Agree with Plan of Care Patient           Patient will benefit from skilled therapeutic intervention in order to improve the following deficits and impairments:     Visit Diagnosis: Abnormality of gait due to impairment of balance  Balance disorder  Chronic pain of left knee  Chronic pain of right knee     Problem List Patient Active Problem List   Diagnosis Date Noted  . Port-A-Cath in place 06/04/2019  . 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[place]) 04/14/2019  . Counseling regarding advance care planning and goals of care 04/14/2019  . Lymphadenopathy of head and neck 03/17/2019  . Lymph nodes enlarged 03/17/2019  . History of pulmonary embolus (PE) 03/17/2019  . Acute pulmonary embolism ([place]) 03/06/2019  . Diffuse lymphadenopathy 03/06/2019  . Pleural effusion 03/06/2019  . Pruritus 02/06/2019  . Pruritic rash 02/06/2019  . Angio-edema 02/06/2019  . Shortness of breath 02/06/2019  . Osteopenia 12/18/2013  . Heart block AV first degree 12/18/2013  . Breast cancer of upper-inner quadrant of right female breast ([place]) 03/19/2012  . Right knee DJD 08/18/2011  . Hypothyroidism   . Hypertension   . PONV (postoperative nausea and vomiting)   . Hyperlipemia      Sigurd Sos, PT 12/01/19 3:17 PM  [facility] [address] [place], [place], [address] Phone: [phone]   Fax:  [phone]  Name: ALEERA GILCREASE MRN: [id number] Date of Birth: [birth year]

## 2019-12-02 ENCOUNTER — Other Ambulatory Visit: Payer: Self-pay

## 2019-12-02 ENCOUNTER — Inpatient Hospital Stay (HOSPITAL_BASED_OUTPATIENT_CLINIC_OR_DEPARTMENT_OTHER): Payer: Medicare Other | Admitting: Hematology

## 2019-12-02 ENCOUNTER — Inpatient Hospital Stay: Payer: Medicare Other

## 2019-12-02 ENCOUNTER — Other Ambulatory Visit: Payer: Self-pay | Admitting: Hematology

## 2019-12-02 ENCOUNTER — Inpatient Hospital Stay: Payer: Medicare Other | Attending: Hematology

## 2019-12-02 VITALS — BP 146/82 | HR 96 | Temp 97.7°F | Resp 17 | Ht 64.0 in | Wt 116.3 lb

## 2019-12-02 DIAGNOSIS — I2699 Other pulmonary embolism without acute cor pulmonale: Secondary | ICD-10-CM | POA: Insufficient documentation

## 2019-12-02 DIAGNOSIS — E039 Hypothyroidism, unspecified: Secondary | ICD-10-CM | POA: Diagnosis not present

## 2019-12-02 DIAGNOSIS — Z7189 Other specified counseling: Secondary | ICD-10-CM

## 2019-12-02 DIAGNOSIS — Z5111 Encounter for antineoplastic chemotherapy: Secondary | ICD-10-CM | POA: Diagnosis not present

## 2019-12-02 DIAGNOSIS — C865 Angioimmunoblastic T-cell lymphoma: Secondary | ICD-10-CM | POA: Insufficient documentation

## 2019-12-02 DIAGNOSIS — Z5112 Encounter for antineoplastic immunotherapy: Secondary | ICD-10-CM | POA: Diagnosis present

## 2019-12-02 DIAGNOSIS — Z95828 Presence of other vascular implants and grafts: Secondary | ICD-10-CM

## 2019-12-02 DIAGNOSIS — C8440 Peripheral T-cell lymphoma, not classified, unspecified site: Secondary | ICD-10-CM

## 2019-12-02 DIAGNOSIS — R197 Diarrhea, unspecified: Secondary | ICD-10-CM

## 2019-12-02 LAB — CMP (CANCER CENTER ONLY)
ALT: 12 U/L (ref 0–44)
AST: 13 U/L — ABNORMAL LOW (ref 15–41)
Albumin: 3.8 g/dL (ref 3.5–5.0)
Alkaline Phosphatase: 66 U/L (ref 38–126)
Anion gap: 9 (ref 5–15)
BUN: 18 mg/dL (ref 8–23)
CO2: 26 mmol/L (ref 22–32)
Calcium: 9.6 mg/dL (ref 8.9–10.3)
Chloride: 107 mmol/L (ref 98–111)
Creatinine: 0.67 mg/dL (ref 0.44–1.00)
GFR, Est AFR Am: >60 mL/min (ref >60)
GFR, Estimated: >60 mL/min (ref >60)
Glucose, Bld: 84 mg/dL (ref 70–99)
Potassium: 3.8 mmol/L (ref 3.5–5.1)
Sodium: 142 mmol/L (ref 135–145)
Total Bilirubin: 0.5 mg/dL (ref 0.3–1.2)
Total Protein: 6.6 g/dL (ref 6.5–8.1)

## 2019-12-02 LAB — CBC WITH DIFFERENTIAL/PLATELET
Abs Immature Granulocytes: 0.01 K/uL (ref 0.00–0.07)
Basophils Absolute: 0.1 K/uL (ref 0.0–0.1)
Basophils Relative: 1 %
Eosinophils Absolute: 0.1 K/uL (ref 0.0–0.5)
Eosinophils Relative: 4 %
HCT: 39.8 % (ref 36.0–46.0)
Hemoglobin: 12.9 g/dL (ref 12.0–15.0)
Immature Granulocytes: 0 %
Lymphocytes Relative: 17 %
Lymphs Abs: 0.6 K/uL — ABNORMAL LOW (ref 0.7–4.0)
MCH: 28.9 pg (ref 26.0–34.0)
MCHC: 32.4 g/dL (ref 30.0–36.0)
MCV: 89 fL (ref 80.0–100.0)
Monocytes Absolute: 0.4 K/uL (ref 0.1–1.0)
Monocytes Relative: 12 %
Neutro Abs: 2.4 K/uL (ref 1.7–7.7)
Neutrophils Relative %: 66 %
Platelets: 192 K/uL (ref 150–400)
RBC: 4.47 MIL/uL (ref 3.87–5.11)
RDW: 15.5 % (ref 11.5–15.5)
WBC: 3.6 K/uL — ABNORMAL LOW (ref 4.0–10.5)
nRBC: 0 % (ref 0.0–0.2)

## 2019-12-02 LAB — LACTATE DEHYDROGENASE: LDH: 193 U/L — ABNORMAL HIGH (ref 98–192)

## 2019-12-02 MED ORDER — FAMOTIDINE IN NACL 20-0.9 MG/50ML-% IV SOLN
INTRAVENOUS | Status: AC
  Filled 2019-12-02: qty 50

## 2019-12-02 MED ORDER — DEXAMETHASONE SODIUM PHOSPHATE 10 MG/ML IJ SOLN
5.0000 mg | Freq: Once | INTRAMUSCULAR | Status: AC
  Administered 2019-12-02: 5 mg via INTRAVENOUS

## 2019-12-02 MED ORDER — SODIUM CHLORIDE 0.9% FLUSH
10.0000 mL | INTRAVENOUS | Status: DC | PRN
  Administered 2019-12-02: 10 mL
  Filled 2019-12-02: qty 10

## 2019-12-02 MED ORDER — SODIUM CHLORIDE 0.9 % IV SOLN
Freq: Once | INTRAVENOUS | Status: AC
  Filled 2019-12-02: qty 250

## 2019-12-02 MED ORDER — DIPHENHYDRAMINE HCL 50 MG/ML IJ SOLN
50.0000 mg | Freq: Once | INTRAMUSCULAR | Status: AC
  Administered 2019-12-02: 50 mg via INTRAVENOUS

## 2019-12-02 MED ORDER — CITALOPRAM HYDROBROMIDE 10 MG PO TABS
10.0000 mg | ORAL_TABLET | Freq: Every day | ORAL | 1 refills | Status: DC
Start: 2019-12-02 — End: 2019-12-31

## 2019-12-02 MED ORDER — SODIUM CHLORIDE 0.9 % IV SOLN
1.8000 mg/kg | Freq: Once | INTRAVENOUS | Status: AC
  Administered 2019-12-02: 100 mg via INTRAVENOUS
  Filled 2019-12-02: qty 20

## 2019-12-02 MED ORDER — DEXAMETHASONE SODIUM PHOSPHATE 10 MG/ML IJ SOLN
INTRAMUSCULAR | Status: AC
  Filled 2019-12-02: qty 1

## 2019-12-02 MED ORDER — SODIUM CHLORIDE 0.9% FLUSH
10.0000 mL | Freq: Once | INTRAVENOUS | Status: AC
  Administered 2019-12-02: 10 mL
  Filled 2019-12-02: qty 10

## 2019-12-02 MED ORDER — AMOXICILLIN 500 MG PO CAPS
2000.0000 mg | ORAL_CAPSULE | Freq: Once | ORAL | 0 refills | Status: DC | PRN
Start: 2019-12-02 — End: 2021-01-27

## 2019-12-02 MED ORDER — ONDANSETRON HCL 4 MG/2ML IJ SOLN
4.0000 mg | Freq: Once | INTRAMUSCULAR | Status: AC
  Administered 2019-12-02: 4 mg via INTRAVENOUS

## 2019-12-02 MED ORDER — ONDANSETRON HCL 4 MG/2ML IJ SOLN
INTRAMUSCULAR | Status: AC
  Filled 2019-12-02: qty 2

## 2019-12-02 MED ORDER — DIPHENHYDRAMINE HCL 50 MG/ML IJ SOLN
INTRAMUSCULAR | Status: AC
  Filled 2019-12-02: qty 1

## 2019-12-02 MED ORDER — FAMOTIDINE IN NACL 20-0.9 MG/50ML-% IV SOLN
20.0000 mg | Freq: Once | INTRAVENOUS | Status: AC
  Administered 2019-12-02: 20 mg via INTRAVENOUS

## 2019-12-02 MED ORDER — ACETAMINOPHEN 325 MG PO TABS
650.0000 mg | ORAL_TABLET | Freq: Once | ORAL | Status: AC
  Administered 2019-12-02: 650 mg via ORAL

## 2019-12-02 MED ORDER — HEPARIN SOD (PORK) LOCK FLUSH 100 UNIT/ML IV SOLN
500.0000 [IU] | Freq: Once | INTRAVENOUS | Status: AC | PRN
  Administered 2019-12-02: 500 [IU]
  Filled 2019-12-02: qty 5

## 2019-12-02 MED ORDER — ACETAMINOPHEN 325 MG PO TABS
ORAL_TABLET | ORAL | Status: AC
  Filled 2019-12-02: qty 2

## 2019-12-02 NOTE — Patient Instructions (Signed)
[facility] Discharge Instructions for Patients Receiving Chemotherapy  Today you received the following chemotherapy agents: brentuximab vendotin.  To help prevent nausea and vomiting after your treatment, we encourage you to take your nausea medication as directed.   If you develop nausea and vomiting that is not controlled by your nausea medication, call the clinic.   BELOW ARE SYMPTOMS THAT SHOULD BE REPORTED IMMEDIATELY:  *FEVER GREATER THAN 100.5 F  *CHILLS WITH OR WITHOUT FEVER  NAUSEA AND VOMITING THAT IS NOT CONTROLLED WITH YOUR NAUSEA MEDICATION  *UNUSUAL SHORTNESS OF BREATH  *UNUSUAL BRUISING OR BLEEDING  TENDERNESS IN MOUTH AND THROAT WITH OR WITHOUT PRESENCE OF ULCERS  *URINARY PROBLEMS  *BOWEL PROBLEMS  UNUSUAL RASH Items with * indicate a potential emergency and should be followed up as soon as possible.  Feel free to call the clinic should you have any questions or concerns. The clinic phone number is [phone].  Please show the [place] at check-in to the Emergency Department and triage nurse.

## 2019-12-03 ENCOUNTER — Ambulatory Visit: Payer: Medicare Other

## 2019-12-03 DIAGNOSIS — R2689 Other abnormalities of gait and mobility: Secondary | ICD-10-CM | POA: Diagnosis not present

## 2019-12-03 DIAGNOSIS — M25561 Pain in right knee: Secondary | ICD-10-CM | POA: Diagnosis not present

## 2019-12-03 DIAGNOSIS — G8929 Other chronic pain: Secondary | ICD-10-CM | POA: Diagnosis not present

## 2019-12-03 DIAGNOSIS — M25562 Pain in left knee: Secondary | ICD-10-CM | POA: Diagnosis not present

## 2019-12-03 NOTE — Therapy (Signed)
[facility] [address], [place], [place], [address] Phone: [phone]   Fax:  [phone]  Physical Therapy Treatment  Patient Details  Name: Sarah [name] MRN: [id number] Date of Birth: 23-Apr-1933 Referring Provider (PT): Sullivan Lone MD   Encounter Date: 12/03/2019   PT End of Session - 12/03/19 1138    Visit Number 8    Date for PT Re-Evaluation 12/24/19    Authorization Type Medicare/BCBS    Progress Note Due on Visit 10    PT Start Time 1104    PT Stop Time 1142    PT Time Calculation (min) 38 min    Activity Tolerance Patient limited by fatigue    Behavior During Therapy [facility] for tasks assessed/performed           Past Medical History:  Diagnosis Date  . Allergy    codeine, thiazides  . Anxiety    new dx  . Arthritis   . Breast cancer ([place]) 03/14/12   bx=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,ER/PR=+,upper inner quad  . Cancer ([place])    breast  . Cataract   . DVT (deep venous thrombosis) ([place]) 02/2019   left leg  . Dyspnea   . Edema    both legs feet and toe, abdomen  . Glaucoma    laser treated years ago  . HOH (hard of hearing)   . Hyperlipemia   . Hypertension   . Hypothyroidism   . Pancreatic cyst    benign  . PONV (postoperative nausea and vomiting)   . Pulmonary embolism ([place]) 02/2019   bilateral   . Radiation 06/11/2012-07/12/2012   17 sessions 4250 cGy, 3 sessions 750 cGy  . Vertigo   . Wears glasses   . Wears partial dentures    partial upper    Past Surgical History:  Procedure Laterality Date  . ABDOMINAL HYSTERECTOMY  1966   1/2 ovary left in   . BREAST SURGERY  1992   lumpectomy-lt  . CATARACT EXTRACTION     b/l  . COLONOSCOPY    . EXCISION MASS NECK Left 03/17/2019    EXCISION MASS NECK (Left Neck)  . EXCISION MASS NECK Left 03/17/2019   Procedure: EXCISION MASS NECK;  Surgeon: Helayne Seminole, MD;  Location: [place];  Service: ENT;   Laterality: Left;  . EYE SURGERY Bilateral    bilateral cataract removal  . IR IMAGING GUIDED PORT INSERTION  04/23/2019  . JOINT REPLACEMENT  2013   rt total knee  . JOINT REPLACEMENT  1995   lt total knee  . PARTIAL MASTECTOMY WITH NEEDLE LOCALIZATION  04/09/2012   Procedure: PARTIAL MASTECTOMY WITH NEEDLE LOCALIZATION;  Surgeon: Adin Hector, MD;  Location: [place];  Service: General;  Laterality: Right;  . SKIN BIOPSY Left 03/17/2019   LEFT THIGH  . SKIN BIOPSY Left 03/17/2019   Procedure: Skin Biopsy Left Thigh;  Surgeon: Helayne Seminole, MD;  Location: [place];  Service: ENT;  Laterality: Left;  . TONSILLECTOMY    . TOTAL KNEE ARTHROPLASTY  08/16/2011   Procedure: TOTAL KNEE ARTHROPLASTY;  Surgeon: Ninetta Lights, MD;  Location: [place];  Service: Orthopedics;  Laterality: Right;    There were no vitals filed for this visit.   Subjective Assessment - 12/03/19 1103    Subjective I am weak and wobbly.    Currently in Pain? Yes    Pain Location Knee    Pain Descriptors / Indicators Aching  Pain Type Chronic pain    Pain Onset More than a month ago                             [facility] Adult PT Treatment/Exercise - 12/03/19 0001      High Level Balance   High Level Balance Comments tandem stance 2x 10 seconds bil each   some UE support required      Knee/Hip Exercises: Stretches   Active Hamstring Stretch Both;2 reps;20 seconds      Knee/Hip Exercises: Aerobic   Nustep Level 2 x 8 minutes- PT present to discuss progress   improved tolerance today      Knee/Hip Exercises: Standing   Heel Raises Both;2 sets;10 reps    Hip Abduction Stengthening;Both;2 sets;10 reps    Other Standing Knee Exercises weight shifting on balance pad x1.5 minutes    Other Standing Knee Exercises alternating step taps at edge of treadmill 2x20 seconds       Knee/Hip Exercises: Seated   Ball Squeeze x20    Marching Strengthening;20 reps;Weights       Shoulder Exercises: Seated   Flexion Strengthening;Both;20 reps;Weights    Flexion Weight (lbs) 1    Other Seated Exercises overhead press: 1# 2x10    Other Seated Exercises biceps curls 1# 2x10                    PT Short Term Goals - 10/22/19 1441      PT SHORT TERM GOAL #1   Title Pt will be independent with HEP within 3 weeks in order to demonstrate autonomy of care.    Baseline pt does not have an HEP    Time 3    Period Weeks    Status New    Target Date 11/19/19             PT Long Term Goals - 10/22/19 1456      PT LONG TERM GOAL #1   Title Pt will improve TUG score to 13.5 seconds or less in order to demonstrate a decreased risk for falls.    Baseline 20 seconds    Time 8    Period Weeks    Status New    Target Date 12/24/19      PT LONG TERM GOAL #2   Title Pt will demonstrate 8x sit to stand w/o use of UE from standard chair height in order to demonstrate improved functional LE strength.    Baseline 7x with intermittent UE use on the legs and on slightly raised plinth table.    Time 8    Period Weeks    Status New    Target Date 12/24/19      PT LONG TERM GOAL #3   Title Pt will report 3/10 pain or less and 50% or greater improvement in funtional mobility and pain with activity within 8 weeks to demonstrate an overall subjective improvement in quality of life.    Baseline 7/10 pain at the knees.    Time 8    Period Weeks    Status New    Target Date 12/24/19                 Plan - 12/03/19 1137    Clinical Impression Statement Pt continues to require close supervision for safety with standing exercises.  Pt with bil knee flexion upon standing and is able to improve posture after standing for  a few minutes.  Pt was advised to do HS stretch at home to improve knee flexibility.   Pt did well with 8 minutes on the NuStep and was fatigued with this.   Pt did well with balance exercises and was able to perform weight shifting on foam without  UE support.  Pt was fatigued at the end of session.  Pt will continue to benefit from skilled PT to address balance and strength deficits associated with lymphoma.    PT Frequency 2x / week    PT Duration 8 weeks    PT Treatment/Interventions Therapeutic activities;Therapeutic exercise;Neuromuscular re-education;Moist Heat;Manual techniques;Patient/family education    PT Next Visit Plan work on balance activities, core/pelvic and LE strengthening/endurance.    PT Home Exercise Plan Access Code: [email or web address]    Consulted and Agree with Plan of Care Patient           Patient will benefit from skilled therapeutic intervention in order to improve the following deficits and impairments:  Abnormal gait, Decreased strength, Decreased balance, Pain  Visit Diagnosis: Abnormality of gait due to impairment of balance  Balance disorder  Chronic pain of left knee  Chronic pain of right knee     Problem List Patient Active Problem List   Diagnosis Date Noted  . Port-A-Cath in place 06/04/2019  . 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[place]) 04/14/2019  . Counseling regarding advance care planning and goals of care 04/14/2019  . Lymphadenopathy of head and neck 03/17/2019  . Lymph nodes enlarged 03/17/2019  . History of pulmonary embolus (PE) 03/17/2019  . Acute pulmonary embolism ([place]) 03/06/2019  . Diffuse lymphadenopathy 03/06/2019  . Pleural effusion 03/06/2019  . Pruritus 02/06/2019  . Pruritic rash 02/06/2019  . Angio-edema 02/06/2019  . Shortness of breath 02/06/2019  . Osteopenia 12/18/2013  . Heart block AV first degree 12/18/2013  . Breast cancer of upper-inner quadrant of right female breast ([place]) 03/19/2012  . Right knee DJD 08/18/2011  . Hypothyroidism   . Hypertension   . PONV (postoperative nausea and vomiting)   . Hyperlipemia     Sigurd Sos, PT 12/03/19 11:39 AM  [facility] [address], [place], [place],  [address] Phone: [phone]   Fax:  [phone]  Name: Sarah [name] MRN: [id number] Date of Birth: March 27, 1933

## 2019-12-04 ENCOUNTER — Telehealth: Payer: Self-pay | Admitting: *Deleted

## 2019-12-04 NOTE — Telephone Encounter (Signed)
Daughter called. Directions for amoxicillin are confusing to patient: "Take 4 capsules (2,000 mg total) by mouth once as needed for up to 1 dose. 60 mins prior to dental procedure" The part that confused her is the "up to 1 dose". To clarify -- she is to take 4 capsules of Amoxicillin 500 mg an hour before dental procedure? Per Dr. Irene Limbo - take 4 capsules 1 hour prior to procedure. Contacted [name] with this information. She verbalized understanding.

## 2019-12-09 ENCOUNTER — Other Ambulatory Visit: Payer: Self-pay

## 2019-12-09 ENCOUNTER — Ambulatory Visit: Payer: Medicare Other | Admitting: Physical Therapy

## 2019-12-09 DIAGNOSIS — M25561 Pain in right knee: Secondary | ICD-10-CM | POA: Diagnosis not present

## 2019-12-09 DIAGNOSIS — R2689 Other abnormalities of gait and mobility: Secondary | ICD-10-CM

## 2019-12-09 DIAGNOSIS — G8929 Other chronic pain: Secondary | ICD-10-CM

## 2019-12-09 DIAGNOSIS — M25562 Pain in left knee: Secondary | ICD-10-CM | POA: Diagnosis not present

## 2019-12-09 NOTE — Therapy (Signed)
[facility] [address], [place], [place], [address] Phone: [phone]   Fax:  [phone]  Physical Therapy Treatment  Patient Details  Name: Sarah [name] MRN: [id number] Date of Birth: 1932-08-21 Referring Provider (PT): Sullivan Lone MD   Encounter Date: 12/09/2019   PT End of Session - 12/09/19 2104    Visit Number 9    Number of Visits 17    Date for PT Re-Evaluation 12/24/19    Authorization Type Medicare/BCBS    Progress Note Due on Visit 10    PT Start Time 1100    PT Stop Time 1140    PT Time Calculation (min) 40 min    Activity Tolerance Patient limited by fatigue           Past Medical History:  Diagnosis Date  . Allergy    codeine, thiazides  . Anxiety    new dx  . Arthritis   . Breast cancer ([place]) 03/14/12   bx=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,ER/PR=+,upper inner quad  . Cancer ([place])    breast  . Cataract   . DVT (deep venous thrombosis) ([place]) 02/2019   left leg  . Dyspnea   . Edema    both legs feet and toe, abdomen  . Glaucoma    laser treated years ago  . HOH (hard of hearing)   . Hyperlipemia   . Hypertension   . Hypothyroidism   . Pancreatic cyst    benign  . PONV (postoperative nausea and vomiting)   . Pulmonary embolism ([place]) 02/2019   bilateral   . Radiation 06/11/2012-07/12/2012   17 sessions 4250 cGy, 3 sessions 750 cGy  . Vertigo   . Wears glasses   . Wears partial dentures    partial upper    Past Surgical History:  Procedure Laterality Date  . ABDOMINAL HYSTERECTOMY  1966   1/2 ovary left in   . BREAST SURGERY  1992   lumpectomy-lt  . CATARACT EXTRACTION     b/l  . COLONOSCOPY    . EXCISION MASS NECK Left 03/17/2019    EXCISION MASS NECK (Left Neck)  . EXCISION MASS NECK Left 03/17/2019   Procedure: EXCISION MASS NECK;  Surgeon: Helayne Seminole, MD;  Location: [place];  Service: ENT;  Laterality: Left;  . EYE SURGERY  Bilateral    bilateral cataract removal  . IR IMAGING GUIDED PORT INSERTION  04/23/2019  . JOINT REPLACEMENT  2013   rt total knee  . JOINT REPLACEMENT  1995   lt total knee  . PARTIAL MASTECTOMY WITH NEEDLE LOCALIZATION  04/09/2012   Procedure: PARTIAL MASTECTOMY WITH NEEDLE LOCALIZATION;  Surgeon: Adin Hector, MD;  Location: [place];  Service: General;  Laterality: Right;  . SKIN BIOPSY Left 03/17/2019   LEFT THIGH  . SKIN BIOPSY Left 03/17/2019   Procedure: Skin Biopsy Left Thigh;  Surgeon: Helayne Seminole, MD;  Location: [place];  Service: ENT;  Laterality: Left;  . TONSILLECTOMY    . TOTAL KNEE ARTHROPLASTY  08/16/2011   Procedure: TOTAL KNEE ARTHROPLASTY;  Surgeon: Ninetta Lights, MD;  Location: [place];  Service: Orthopedics;  Laterality: Right;    There were no vitals filed for this visit.   Subjective Assessment - 12/09/19 1105    Subjective My legs are still weak and wobbly.  My knees ache all the time. I can't stand to talk to someone very long.    Pertinent History Pt  is taking antibody treatments for lymphoma. She has finished 2 of 4 rounds. She has previously performed 6 cycles 2 of chmotherapy and 2 of antibodies .When she came out of remission she is no only on antibody treatment cycles, Possible macular degeneration    Patient Stated Goals Improve my balance and help improve my achy knees.    Currently in Pain? Yes    Pain Score 6     Pain Location Knee    Pain Orientation Right                             [facility] Adult PT Treatment/Exercise - 12/09/19 0001      Knee/Hip Exercises: Stretches   Other Knee/Hip Stretches 2nd step hip flexor stretch 10x right/left       Knee/Hip Exercises: Aerobic   Nustep Level 1 x 8 minutes- PT present to discuss progress   improved tolerance today      Knee/Hip Exercises: Standing   Heel Raises Both;2 sets;10 reps    Hip Abduction Stengthening;Both;1 set;10 reps    Other Standing Knee  Exercises weight shift 4 ways with limited UE support     Other Standing Knee Exercises alternating step taps 30 sec       Knee/Hip Exercises: Seated   Ball Squeeze x20    Clamshell with TheraBand Red   20x   Sit to Sand 5 reps   from foam in chair     Shoulder Exercises: Seated   Flexion Strengthening;Both;20 reps;Weights    Flexion Weight (lbs) 1    Other Seated Exercises overhead press: 1# 2x10    Other Seated Exercises biceps curls 1# 2x10                    PT Short Term Goals - 10/22/19 1441      PT SHORT TERM GOAL #1   Title Pt will be independent with HEP within 3 weeks in order to demonstrate autonomy of care.    Baseline pt does not have an HEP    Time 3    Period Weeks    Status New    Target Date 11/19/19             PT Long Term Goals - 10/22/19 1456      PT LONG TERM GOAL #1   Title Pt will improve TUG score to 13.5 seconds or less in order to demonstrate a decreased risk for falls.    Baseline 20 seconds    Time 8    Period Weeks    Status New    Target Date 12/24/19      PT LONG TERM GOAL #2   Title Pt will demonstrate 8x sit to stand w/o use of UE from standard chair height in order to demonstrate improved functional LE strength.    Baseline 7x with intermittent UE use on the legs and on slightly raised plinth table.    Time 8    Period Weeks    Status New    Target Date 12/24/19      PT LONG TERM GOAL #3   Title Pt will report 3/10 pain or less and 50% or greater improvement in funtional mobility and pain with activity within 8 weeks to demonstrate an overall subjective improvement in quality of life.    Baseline 7/10 pain at the knees.    Time 8    Period Weeks  Status New    Target Date 12/24/19                 Plan - 12/09/19 2105    Clinical Impression Statement The patient reports general fatigue and requires 3 seated rest breaks between standing ex's.  She needs close supervision for safety with weight shifting  ex's particulary with anterior and posterior directions.   She complains of knee pain on arrival today but denies increased pain with limited reps of sit to stand from a higher surface.  Therapist monitoring response throughout treatment session and modifying accordingly.    Comorbidities bil knee replacements, lymphoma with on-going treatment (previous chemotherapy currenty antibody therapy)    Rehab Potential Good    PT Frequency 2x / week    PT Duration 8 weeks    PT Treatment/Interventions Therapeutic activities;Therapeutic exercise;Neuromuscular re-education;Moist Heat;Manual techniques;Patient/family education    PT Next Visit Plan 10th visit progress note;  TUG; 8x sit to stand;  work on balance activities, core/pelvic and LE strengthening/endurance.    PT Home Exercise Plan Access Code: [email or web address]           Patient will benefit from skilled therapeutic intervention in order to improve the following deficits and impairments:  Abnormal gait, Decreased strength, Decreased balance, Pain  Visit Diagnosis: Abnormality of gait due to impairment of balance  Balance disorder  Chronic pain of left knee  Chronic pain of right knee     Problem List Patient Active Problem List   Diagnosis Date Noted  . Port-A-Cath in place 06/04/2019  . 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[place]) 04/14/2019  . Counseling regarding advance care planning and goals of care 04/14/2019  . Lymphadenopathy of head and neck 03/17/2019  . Lymph nodes enlarged 03/17/2019  . History of pulmonary embolus (PE) 03/17/2019  . Acute pulmonary embolism ([place]) 03/06/2019  . Diffuse lymphadenopathy 03/06/2019  . Pleural effusion 03/06/2019  . Pruritus 02/06/2019  . Pruritic rash 02/06/2019  . Angio-edema 02/06/2019  . Shortness of breath 02/06/2019  . Osteopenia 12/18/2013  . Heart block AV first degree 12/18/2013  . Breast cancer of upper-inner quadrant of right female breast ([place]) 03/19/2012  . Right knee DJD 08/18/2011    . Hypothyroidism   . Hypertension   . PONV (postoperative nausea and vomiting)   . Hyperlipemia    Ruben Im, PT 12/09/19 9:39 PM Phone: [phone] Fax: [phone] Alvera Singh 12/09/2019, 9:39 PM  [facility] [address], [place], [place], [address] Phone: [phone]   Fax:  [phone]  Name: Sarah [name] MRN: [id number] Date of Birth: 1932-12-31

## 2019-12-11 ENCOUNTER — Ambulatory Visit: Payer: Medicare Other | Admitting: Physical Therapy

## 2019-12-11 ENCOUNTER — Other Ambulatory Visit: Payer: Self-pay

## 2019-12-11 DIAGNOSIS — M25561 Pain in right knee: Secondary | ICD-10-CM | POA: Diagnosis not present

## 2019-12-11 DIAGNOSIS — M25562 Pain in left knee: Secondary | ICD-10-CM

## 2019-12-11 DIAGNOSIS — G8929 Other chronic pain: Secondary | ICD-10-CM | POA: Diagnosis not present

## 2019-12-11 DIAGNOSIS — R2689 Other abnormalities of gait and mobility: Secondary | ICD-10-CM

## 2019-12-11 NOTE — Therapy (Signed)
[facility] [address], [place], [place], [address] Phone: [phone]   Fax:  [phone]  Physical Therapy Treatment  Patient Details  Name: Sarah [name] MRN: [id number] Date of Birth: March 17, 1933 Referring Provider (PT): Sullivan Lone MD  Progress Note Reporting Period 10/22/19 to 12/11/19  See note below for Objective Data and Assessment of Progress/Goals.      Encounter Date: 12/11/2019   PT End of Session - 12/11/19 1138    Visit Number 10    Number of Visits 17    Date for PT Re-Evaluation 12/24/19    Authorization Type Medicare/BCBS    Progress Note Due on Visit 20    PT Start Time 1100    PT Stop Time 1140    PT Time Calculation (min) 40 min    Activity Tolerance Patient limited by fatigue;Patient limited by pain           Past Medical History:  Diagnosis Date  . Allergy    codeine, thiazides  . Anxiety    new dx  . Arthritis   . Breast cancer ([place]) 03/14/12   bx=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,ER/PR=+,upper inner quad  . Cancer ([place])    breast  . Cataract   . DVT (deep venous thrombosis) ([place]) 02/2019   left leg  . Dyspnea   . Edema    both legs feet and toe, abdomen  . Glaucoma    laser treated years ago  . HOH (hard of hearing)   . Hyperlipemia   . Hypertension   . Hypothyroidism   . Pancreatic cyst    benign  . PONV (postoperative nausea and vomiting)   . Pulmonary embolism ([place]) 02/2019   bilateral   . Radiation 06/11/2012-07/12/2012   17 sessions 4250 cGy, 3 sessions 750 cGy  . Vertigo   . Wears glasses   . Wears partial dentures    partial upper    Past Surgical History:  Procedure Laterality Date  . ABDOMINAL HYSTERECTOMY  1966   1/2 ovary left in   . BREAST SURGERY  1992   lumpectomy-lt  . CATARACT EXTRACTION     b/l  . COLONOSCOPY    . EXCISION MASS NECK Left 03/17/2019    EXCISION MASS NECK (Left Neck)  . EXCISION MASS NECK Left  03/17/2019   Procedure: EXCISION MASS NECK;  Surgeon: Helayne Seminole, MD;  Location: [facility];  Service: ENT;  Laterality: Left;  . EYE SURGERY Bilateral    bilateral cataract removal  . IR IMAGING GUIDED PORT INSERTION  04/23/2019  . JOINT REPLACEMENT  2013   rt total knee  . JOINT REPLACEMENT  1995   lt total knee  . PARTIAL MASTECTOMY WITH NEEDLE LOCALIZATION  04/09/2012   Procedure: PARTIAL MASTECTOMY WITH NEEDLE LOCALIZATION;  Surgeon: Adin Hector, MD;  Location: [place];  Service: General;  Laterality: Right;  . SKIN BIOPSY Left 03/17/2019   LEFT THIGH  . SKIN BIOPSY Left 03/17/2019   Procedure: Skin Biopsy Left Thigh;  Surgeon: Helayne Seminole, MD;  Location: [place];  Service: ENT;  Laterality: Left;  . TONSILLECTOMY    . TOTAL KNEE ARTHROPLASTY  08/16/2011   Procedure: TOTAL KNEE ARTHROPLASTY;  Surgeon: Ninetta Lights, MD;  Location: [facility];  Service: Orthopedics;  Laterality: Right;    There were no vitals filed for this visit.   Subjective Assessment - 12/11/19 1103    Subjective My legs are still  achey.  After last visit, my knees ached the next day.    Pertinent History Pt is taking antibody treatments for lymphoma. She has finished 2 of 4 rounds. She has previously performed 6 cycles 2 of chmotherapy and 2 of antibodies .When she came out of remission she is no only on antibody treatment cycles, Possible macular degeneration    Currently in Pain? Yes    Pain Score 5     Pain Location Knee    Pain Orientation Right;Left              [facility] PT Assessment - 12/11/19 0001      Standardized Balance Assessment   Five times sit to stand comments  8x sit to stand in 30 sec       Timed Up and Go Test   Normal TUG (seconds) 30    TUG Comments initial 20 sec                          [facility] Adult PT Treatment/Exercise - 12/11/19 0001      Knee/Hip Exercises: Aerobic   Nustep Level 1 x 8 minutes- PT present to discuss progress    improved tolerance today      Knee/Hip Exercises: Standing   Heel Raises Both;10 reps   discontinued secondary c/o left knee pain   Hip Abduction Stengthening;Both;1 set;10 reps    Hip Extension Stengthening;Right;Left;2 sets;5 reps    Other Standing Knee Exercises side stepping at the counter       Knee/Hip Exercises: Seated   Long Arc Quad Right;Left;10 reps    Sit to [employer] 1 set;5 reps      Shoulder Exercises: Seated   Flexion Strengthening;Both;20 reps;Weights    Flexion Weight (lbs) 1    Abduction AROM;Both;10 reps    Other Seated Exercises scaption 10x                   PT Education - 12/11/19 1134    Education Details wall push ups; standing hip abduction, hip extension, side step at the counter    Person(s) Educated Patient    Methods Explanation;Demonstration;Handout    Comprehension Returned demonstration;Verbalized understanding            PT Short Term Goals - 10/22/19 1441      PT SHORT TERM GOAL #1   Title Pt will be independent with HEP within 3 weeks in order to demonstrate autonomy of care.    Baseline pt does not have an HEP    Time 3    Period Weeks    Status New    Target Date 11/19/19             PT Long Term Goals - 10/22/19 1456      PT LONG TERM GOAL #1   Title Pt will improve TUG score to 13.5 seconds or less in order to demonstrate a decreased risk for falls.    Baseline 20 seconds    Time 8    Period Weeks    Status New    Target Date 12/24/19      PT LONG TERM GOAL #2   Title Pt will demonstrate 8x sit to stand w/o use of UE from standard chair height in order to demonstrate improved functional LE strength.    Baseline 7x with intermittent UE use on the legs and on slightly raised plinth table.    Time 8    Period Weeks  Status New    Target Date 12/24/19      PT LONG TERM GOAL #3   Title Pt will report 3/10 pain or less and 50% or greater improvement in funtional mobility and pain with activity within 8 weeks to  demonstrate an overall subjective improvement in quality of life.    Baseline 7/10 pain at the knees.    Time 8    Period Weeks    Status New    Target Date 12/24/19                 Plan - 12/11/19 1747    Clinical Impression Statement The patient needs multiple modifications to exercise secondary to knee pain.  Moderate verbal cues to perform ex's given for HEP previously.  Her Timed up and Go time did not improve, she reports she is extra cautious especially with turning b/c she had a friend who fell when turning/changing direction.  She has improved with sit to stand speed with 8x in 30 seconds indicating improved strength.  Recommend 2-3 more visits to well establish a HEP for long term benefit.    Comorbidities bil knee replacements, lymphoma with on-going treatment (previous chemotherapy currenty antibody therapy)    Rehab Potential Good    PT Frequency 2x / week    PT Duration 8 weeks    PT Treatment/Interventions Therapeutic activities;Therapeutic exercise;Neuromuscular re-education;Moist Heat;Manual techniques;Patient/family education    PT Next Visit Plan work on balance activities, core/pelvic and LE strengthening/endurance.    PT Home Exercise Plan Access Code: [email or web address]           Patient will benefit from skilled therapeutic intervention in order to improve the following deficits and impairments:  Abnormal gait, Decreased strength, Decreased balance, Pain  Visit Diagnosis: Abnormality of gait due to impairment of balance  Balance disorder  Chronic pain of left knee  Chronic pain of right knee     Problem List Patient Active Problem List   Diagnosis Date Noted  . Port-A-Cath in place 06/04/2019  . 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[place]) 04/14/2019  . Counseling regarding advance care planning and goals of care 04/14/2019  . Lymphadenopathy of head and neck 03/17/2019  . Lymph nodes enlarged 03/17/2019  . History of pulmonary embolus (PE) 03/17/2019  . Acute  pulmonary embolism ([place]) 03/06/2019  . Diffuse lymphadenopathy 03/06/2019  . Pleural effusion 03/06/2019  . Pruritus 02/06/2019  . Pruritic rash 02/06/2019  . Angio-edema 02/06/2019  . Shortness of breath 02/06/2019  . Osteopenia 12/18/2013  . Heart block AV first degree 12/18/2013  . Breast cancer of upper-inner quadrant of right female breast ([place]) 03/19/2012  . Right knee DJD 08/18/2011  . Hypothyroidism   . Hypertension   . PONV (postoperative nausea and vomiting)   . Hyperlipemia    Ruben Im, PT 12/11/19 5:54 PM Phone: [phone] Fax: [phone] Alvera Singh 12/11/2019, 5:54 PM  [facility] [address], [place], [place], [address] Phone: [phone]   Fax:  [phone]  Name: Sarah [name] MRN: [id number] Date of Birth: 1933-01-23

## 2019-12-11 NOTE — Patient Instructions (Signed)
Access Code: [email or web address] URL: [email or web address] Date: 12/11/2019 Prepared by: Ruben Im  Exercises Hooklying Isometric Hip Abduction with Belt - 2 x daily - 7 x weekly - 1 sets - 10 reps - 10 seconds hold Supine Pelvic Tilt with Straight Leg Raise - 1 x daily - 7 x weekly - 2 sets - 5 reps Sit to Stand without Arm Support - 2 x daily - 7 x weekly - 1 sets - 10 reps Seated Heel Toe Raises - 2 x daily - 7 x weekly - 2 sets - 10 reps Seated Long Arc Quad - 2 x daily - 7 x weekly - 2 sets - 10 reps Seated Shoulder Abduction - Palms Down - 2 x daily - 7 x weekly - 10 reps - 1 sets Seated Shoulder Flexion - 2 x daily - 7 x weekly - 10 reps - 1 sets Seated Shoulder Scaption - 2 x daily - 7 x weekly - 10 reps - 1 sets Wall Push Up - 1 x daily - 7 x weekly - 1 sets - 10 reps Standing Hip Abduction with Counter Support - 1 x daily - 7 x weekly - 2 sets - 5 reps Standing Hip Extension with Counter Support - 1 x daily - 7 x weekly - 2 sets - 5 reps Side Stepping with Counter Support - 1 x daily - 7 x weekly - 1 sets - 10 reps

## 2019-12-16 ENCOUNTER — Encounter: Payer: Self-pay | Admitting: Physical Therapy

## 2019-12-16 ENCOUNTER — Other Ambulatory Visit: Payer: Self-pay

## 2019-12-16 ENCOUNTER — Ambulatory Visit: Payer: Medicare Other | Admitting: Physical Therapy

## 2019-12-16 DIAGNOSIS — G8929 Other chronic pain: Secondary | ICD-10-CM

## 2019-12-16 DIAGNOSIS — M25562 Pain in left knee: Secondary | ICD-10-CM

## 2019-12-16 DIAGNOSIS — R2689 Other abnormalities of gait and mobility: Secondary | ICD-10-CM | POA: Diagnosis not present

## 2019-12-16 DIAGNOSIS — M25561 Pain in right knee: Secondary | ICD-10-CM

## 2019-12-16 NOTE — Therapy (Signed)
[facility] [address], [place], [place], [address] Phone: [phone]   Fax:  [phone]  Physical Therapy Treatment  Patient Details  Name: Sarah [name] MRN: [id number] Date of Birth: [birth year] Referring Provider (PT): Sullivan Lone MD   Encounter Date: 12/16/2019   PT End of Session - 12/16/19 1746    Visit Number 11    Number of Visits 17    Date for PT Re-Evaluation 12/24/19    Authorization Type Medicare/BCBS    Progress Note Due on Visit 20    PT Start Time 1100    PT Stop Time 1140    PT Time Calculation (min) 40 min    Activity Tolerance Patient tolerated treatment well           Past Medical History:  Diagnosis Date   Allergy    codeine, thiazides   Anxiety    new dx   Arthritis    Breast cancer ([place]) 03/14/12   bx=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,ER/PR=+,upper inner quad   Cancer ([place])    breast   Cataract    DVT (deep venous thrombosis) ([place]) 02/2019   left leg   Dyspnea    Edema    both legs feet and toe, abdomen   Glaucoma    laser treated years ago   HOH (hard of hearing)    Hyperlipemia    Hypertension    Hypothyroidism    Pancreatic cyst    benign   PONV (postoperative nausea and vomiting)    Pulmonary embolism ([place]) 02/2019   bilateral    Radiation 06/11/2012-07/12/2012   17 sessions 4250 cGy, 3 sessions 750 cGy   Vertigo    Wears glasses    Wears partial dentures    partial upper    Past Surgical History:  Procedure Laterality Date   ABDOMINAL HYSTERECTOMY  1966   1/2 ovary left in    [place]   lumpectomy-lt   CATARACT EXTRACTION     b/l   COLONOSCOPY     EXCISION MASS NECK Left 03/17/2019    EXCISION MASS NECK (Left Neck)   EXCISION MASS NECK Left 03/17/2019   Procedure: EXCISION MASS NECK;  Surgeon: Helayne Seminole, MD;  Location: [place];  Service: ENT;  Laterality: Left;   EYE SURGERY  Bilateral    bilateral cataract removal   IR IMAGING GUIDED PORT INSERTION  04/23/2019   JOINT REPLACEMENT  2013   rt total knee   JOINT REPLACEMENT  1995   lt total knee   PARTIAL MASTECTOMY WITH NEEDLE LOCALIZATION  04/09/2012   Procedure: PARTIAL MASTECTOMY WITH NEEDLE LOCALIZATION;  Surgeon: Adin Hector, MD;  Location: [place];  Service: General;  Laterality: Right;   SKIN BIOPSY Left 03/17/2019   LEFT THIGH   SKIN BIOPSY Left 03/17/2019   Procedure: Skin Biopsy Left Thigh;  Surgeon: Helayne Seminole, MD;  Location: [facility];  Service: ENT;  Laterality: Left;   TONSILLECTOMY     TOTAL KNEE ARTHROPLASTY  08/16/2011   Procedure: TOTAL KNEE ARTHROPLASTY;  Surgeon: Ninetta Lights, MD;  Location: [place];  Service: Orthopedics;  Laterality: Right;    There were no vitals filed for this visit.   Subjective Assessment - 12/16/19 1101    Subjective I'm thinking of downsizing.  My knees are still aching this morning.  I've been doing that sit to stand ex without my arms.    Pertinent History  Pt is taking antibody treatments for lymphoma. She has finished 2 of 4 rounds. She has previously performed 6 cycles 2 of chmotherapy and 2 of antibodies .When she came out of remission she is no only on antibody treatment cycles, Possible macular degeneration    How long can you sit comfortably? pts legs are "wobbly" when she goes to stand up.    Patient Stated Goals Improve my balance and help improve my achy knees.    Currently in Pain? Yes    Pain Score 5     Pain Location Knee    Pain Orientation Right;Left                             [facility] Adult PT Treatment/Exercise - 12/16/19 0001      Neuro Re-ed    Neuro Re-ed Details  reaching forward, head turns, reaching down to floor, staggered standing; church pew sway, side to side sway      Knee/Hip Exercises: Stretches   Active Hamstring Stretch Both;2 reps;20 seconds    Active Hamstring Stretch  Limitations seated       Knee/Hip Exercises: Aerobic   Nustep Level 1 x 8 minutes- PT present to discuss progress   improved tolerance today      Knee/Hip Exercises: Standing   Heel Raises --   discontinued secondary c/o left knee pain   Hip Abduction Stengthening;Both;1 set;10 reps    Hip Extension Stengthening;Right;Left;2 sets;5 reps    Gait Training modified dead lift 2# 2x 5     Other Standing Knee Exercises side stepping at the counter     Other Standing Knee Exercises step taps inside cabinet 2x 30 sec       Knee/Hip Exercises: Seated   Long Arc Quad Right;Left;10 reps    Sit to [employer] 1 set;5 reps   high table      Shoulder Exercises: Seated   Flexion --    Flexion Weight (lbs) --    Abduction --    Other Seated Exercises flex and abduction 7x each       Shoulder Exercises: Standing   Other Standing Exercises counter top push ups 2x5                     PT Short Term Goals - 12/16/19 1838      PT SHORT TERM GOAL #1   Title Pt will be independent with HEP within 3 weeks in order to demonstrate autonomy of care.    Status Achieved             PT Long Term Goals - 10/22/19 1456      PT LONG TERM GOAL #1   Title Pt will improve TUG score to 13.5 seconds or less in order to demonstrate a decreased risk for falls.    Baseline 20 seconds    Time 8    Period Weeks    Status New    Target Date 12/24/19      PT LONG TERM GOAL #2   Title Pt will demonstrate 8x sit to stand w/o use of UE from standard chair height in order to demonstrate improved functional LE strength.    Baseline 7x with intermittent UE use on the legs and on slightly raised plinth table.    Time 8    Period Weeks    Status New    Target Date 12/24/19      PT  LONG TERM GOAL #3   Title Pt will report 3/10 pain or less and 50% or greater improvement in funtional mobility and pain with activity within 8 weeks to demonstrate an overall subjective improvement in quality of life.    Baseline  7/10 pain at the knees.    Time 8    Period Weeks    Status New    Target Date 12/24/19                 Plan - 12/16/19 1747    Clinical Impression Statement The patient is able to perform low to moderate level balance challenges with CGA only.  She  performs standing ex's with no change in knee "achiness" and is able to perform more challenging sit to stand and modified dead lift without exacerbation as well.  Will check progress toward goals next visit.    Comorbidities bil knee replacements, lymphoma with on-going treatment (previous chemotherapy currenty antibody therapy)    Rehab Potential Good    PT Frequency 2x / week    PT Duration 8 weeks    PT Treatment/Interventions Therapeutic activities;Therapeutic exercise;Neuromuscular re-education;Moist Heat;Manual techniques;Patient/family education    PT Next Visit Plan check progress with goals to determine continuation or readiness for discharge; TUG           Patient will benefit from skilled therapeutic intervention in order to improve the following deficits and impairments:  Abnormal gait, Decreased strength, Decreased balance, Pain  Visit Diagnosis: Abnormality of gait due to impairment of balance  Balance disorder  Chronic pain of left knee  Chronic pain of right knee     Problem List Patient Active Problem List   Diagnosis Date Noted   Port-A-Cath in place 06/04/2019   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[place]) 04/14/2019   Counseling regarding advance care planning and goals of care 04/14/2019   Lymphadenopathy of head and neck 03/17/2019   Lymph nodes enlarged 03/17/2019   History of pulmonary embolus (PE) 03/17/2019   Acute pulmonary embolism ([place]) 03/06/2019   Diffuse lymphadenopathy 03/06/2019   Pleural effusion 03/06/2019   Pruritus 02/06/2019   Pruritic rash 02/06/2019   Angio-edema 02/06/2019   Shortness of breath 02/06/2019   Osteopenia 12/18/2013   Heart block AV first degree  12/18/2013   Breast cancer of upper-inner quadrant of right female breast ([place]) 03/19/2012   Right knee DJD 08/18/2011   Hypothyroidism    Hypertension    PONV (postoperative nausea and vomiting)    Hyperlipemia     Ruben Im, PT 12/16/19 6:40 PM Phone: [phone] Fax: [phone] Alvera Singh 12/16/2019, 6:39 PM  [facility] [address] [place], [place], [address] Phone: [phone]   Fax:  [phone]  Name: Sarah [name] MRN: [id number] Date of Birth: 1933/02/26

## 2019-12-18 ENCOUNTER — Ambulatory Visit: Payer: Medicare Other | Admitting: Physical Therapy

## 2019-12-18 ENCOUNTER — Other Ambulatory Visit: Payer: Self-pay

## 2019-12-18 DIAGNOSIS — G8929 Other chronic pain: Secondary | ICD-10-CM

## 2019-12-18 DIAGNOSIS — R2689 Other abnormalities of gait and mobility: Secondary | ICD-10-CM | POA: Diagnosis not present

## 2019-12-18 DIAGNOSIS — M25561 Pain in right knee: Secondary | ICD-10-CM | POA: Diagnosis not present

## 2019-12-18 DIAGNOSIS — M25562 Pain in left knee: Secondary | ICD-10-CM | POA: Diagnosis not present

## 2019-12-18 NOTE — Therapy (Signed)
[facility] [address], [place], [place], [address] Phone: [phone]   Fax:  [phone]  Physical Therapy Treatment/Discharge Summary   Patient Details  Name: Sarah [name] MRN: [id number] Date of Birth: 08-12-1932 Referring Provider (PT): Sullivan Lone MD   Encounter Date: 12/18/2019   PT End of Session - 12/18/19 1908    Visit Number 12    Number of Visits 17    Date for PT Re-Evaluation 12/24/19    Authorization Type Medicare/BCBS    Progress Note Due on Visit 20    PT Start Time 1100    PT Stop Time 1140    PT Time Calculation (min) 40 min    Activity Tolerance Patient tolerated treatment well           Past Medical History:  Diagnosis Date   Allergy    codeine, thiazides   Anxiety    new dx   Arthritis    Breast cancer ([place]) 03/14/12   bx=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,ER/PR=+,upper inner quad   Cancer ([place])    breast   Cataract    DVT (deep venous thrombosis) ([place]) 02/2019   left leg   Dyspnea    Edema    both legs feet and toe, abdomen   Glaucoma    laser treated years ago   HOH (hard of hearing)    Hyperlipemia    Hypertension    Hypothyroidism    Pancreatic cyst    benign   PONV (postoperative nausea and vomiting)    Pulmonary embolism ([place]) 02/2019   bilateral    Radiation 06/11/2012-07/12/2012   17 sessions 4250 cGy, 3 sessions 750 cGy   Vertigo    Wears glasses    Wears partial dentures    partial upper    Past Surgical History:  Procedure Laterality Date   ABDOMINAL HYSTERECTOMY  1966   1/2 ovary left in    [place]   lumpectomy-lt   CATARACT EXTRACTION     b/l   COLONOSCOPY     EXCISION MASS NECK Left 03/17/2019    EXCISION MASS NECK (Left Neck)   EXCISION MASS NECK Left 03/17/2019   Procedure: EXCISION MASS NECK;  Surgeon: Helayne Seminole, MD;  Location: [place];  Service: ENT;  Laterality:  Left;   EYE SURGERY Bilateral    bilateral cataract removal   IR IMAGING GUIDED PORT INSERTION  04/23/2019   JOINT REPLACEMENT  2013   rt total knee   JOINT REPLACEMENT  1995   lt total knee   PARTIAL MASTECTOMY WITH NEEDLE LOCALIZATION  04/09/2012   Procedure: PARTIAL MASTECTOMY WITH NEEDLE LOCALIZATION;  Surgeon: Adin Hector, MD;  Location: [place];  Service: General;  Laterality: Right;   SKIN BIOPSY Left 03/17/2019   LEFT THIGH   SKIN BIOPSY Left 03/17/2019   Procedure: Skin Biopsy Left Thigh;  Surgeon: Helayne Seminole, MD;  Location: [facility];  Service: ENT;  Laterality: Left;   TONSILLECTOMY     TOTAL KNEE ARTHROPLASTY  08/16/2011   Procedure: TOTAL KNEE ARTHROPLASTY;  Surgeon: Ninetta Lights, MD;  Location: [place];  Service: Orthopedics;  Laterality: Right;    There were no vitals filed for this visit.   Subjective Assessment - 12/18/19 1100    Subjective I overdid it the other day going around town with my daughter.  My knees are not as bad as they were the other day.  Pertinent History Pt is taking antibody treatments for lymphoma. She has finished 2 of 4 rounds. She has previously performed 6 cycles 2 of chmotherapy and 2 of antibodies .When she came out of remission she is no only on antibody treatment cycles, Possible macular degeneration    Currently in Pain? Yes    Pain Score 4     Pain Location Knee              [facility] PT Assessment - 12/18/19 0001      Standardized Balance Assessment   Five times sit to stand comments  13x      Timed Up and Go Test   Normal TUG (seconds) 12    TUG Comments 12                         [facility] Adult PT Treatment/Exercise - 12/18/19 0001      Neuro Re-ed    Neuro Re-ed Details  tandem stand with light UE support;  head turns, reaching down to floor, staggered standing; church pew sway, side to side sway      Knee/Hip Exercises: Stretches   Active Hamstring Stretch --    Active  Hamstring Stretch Limitations --      Knee/Hip Exercises: Aerobic   Nustep Level 1 x 8 minutes- PT present to discuss progress   improved tolerance today      Knee/Hip Exercises: Standing   Heel Raises --   discontinued secondary c/o left knee pain   Hip Abduction Stengthening;Both;1 set;10 reps    Hip Extension Stengthening;Right;Left;2 sets;5 reps    Gait Training --    Other Standing Knee Exercises discussion of HEP progression     Other Standing Knee Exercises --      Knee/Hip Exercises: Seated   Long Arc Quad --    Sit to [employer] 1 set;5 reps   high table                  PT Education - 12/18/19 1908    Education Details church pew sway; tandem stand    Person(s) Educated Patient    Methods Explanation;Demonstration;Handout    Comprehension Returned demonstration;Verbalized understanding            PT Short Term Goals - 12/18/19 1914      PT SHORT TERM GOAL #1   Title Pt will be independent with HEP within 3 weeks in order to demonstrate autonomy of care.    Status Achieved             PT Long Term Goals - 12/18/19 1106      PT LONG TERM GOAL #1   Title Pt will improve TUG score to 13.5 seconds or less in order to demonstrate a decreased risk for falls.    Status Achieved      PT LONG TERM GOAL #2   Title Pt will demonstrate 8x sit to stand w/o use of UE from standard chair height in order to demonstrate improved functional LE strength.    Status Achieved      PT LONG TERM GOAL #3   Title Pt will report 3/10 pain or less and 50% or greater improvement in funtional mobility and pain with activity within 8 weeks to demonstrate an overall subjective improvement in quality of life.    Status Partially Met                 Plan - 12/18/19 1115  Clinical Impression Statement The patient demonstrates a signficant improvement in Timed up and Go and # of sit to stands in 30 sec.  She reports a good understanding of her HEP and areas of focus.  Ex's  have been modified numerous times secondary to knee pain.  She is satisfied with her current status and requests discharge from PT at this time. Partial goals have been met.    Comorbidities bil knee replacements, lymphoma with on-going treatment (previous chemotherapy currenty antibody therapy)    PT Home Exercise Plan Access Code: [email or web address]           Patient will benefit from skilled therapeutic intervention in order to improve the following deficits and impairments:  Abnormal gait, Decreased strength, Decreased balance, Pain  Visit Diagnosis: Abnormality of gait due to impairment of balance  Balance disorder  Chronic pain of left knee  Chronic pain of right knee  PHYSICAL THERAPY DISCHARGE SUMMARY  Visits from Start of Care: 12  Current functional level related to goals / functional outcomes: See clinical impressions above   Remaining deficits: As above   Education / Equipment: HEP Plan: Patient agrees to discharge.  Patient goals were met. Patient is being discharged due to being pleased with the current functional level.  ?????         Problem List Patient Active Problem List   Diagnosis Date Noted   Port-A-Cath in place 06/04/2019   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[place]) 04/14/2019   Counseling regarding advance care planning and goals of care 04/14/2019   Lymphadenopathy of head and neck 03/17/2019   Lymph nodes enlarged 03/17/2019   History of pulmonary embolus (PE) 03/17/2019   Acute pulmonary embolism ([place]) 03/06/2019   Diffuse lymphadenopathy 03/06/2019   Pleural effusion 03/06/2019   Pruritus 02/06/2019   Pruritic rash 02/06/2019   Angio-edema 02/06/2019   Shortness of breath 02/06/2019   Osteopenia 12/18/2013   Heart block AV first degree 12/18/2013   Breast cancer of upper-inner quadrant of right female breast ([place]) 03/19/2012   Right knee DJD 08/18/2011   Hypothyroidism    Hypertension    PONV (postoperative nausea and  vomiting)    Hyperlipemia    Ruben Im, PT 12/18/19 7:18 PM Phone: [phone] Fax: [phone] Alvera Singh 12/18/2019, 7:16 PM  [facility] [address], [place], [place], [address] Phone: [phone]   Fax:  [phone]  Name: CHARLANN WAYNE MRN: [id number] Date of Birth: 1933-01-30

## 2019-12-18 NOTE — Patient Instructions (Signed)
Access Code: [email or web address] URL: [email or web address] Date: 12/18/2019 Prepared by: Ruben Im  Exercises Hooklying Isometric Hip Abduction with Belt - 2 x daily - 7 x weekly - 1 sets - 10 reps - 10 seconds hold Supine Pelvic Tilt with Straight Leg Raise - 1 x daily - 7 x weekly - 2 sets - 5 reps Sit to Stand without Arm Support - 2 x daily - 7 x weekly - 1 sets - 10 reps Seated Heel Toe Raises - 2 x daily - 7 x weekly - 2 sets - 10 reps Seated Long Arc Quad - 2 x daily - 7 x weekly - 2 sets - 10 reps Seated Shoulder Abduction - Palms Down - 2 x daily - 7 x weekly - 10 reps - 1 sets Seated Shoulder Flexion - 2 x daily - 7 x weekly - 10 reps - 1 sets Seated Shoulder Scaption - 2 x daily - 7 x weekly - 10 reps - 1 sets Wall Push Up - 1 x daily - 7 x weekly - 1 sets - 10 reps Standing Hip Abduction with Counter Support - 1 x daily - 7 x weekly - 2 sets - 5 reps Standing Hip Extension with Counter Support - 1 x daily - 7 x weekly - 2 sets - 5 reps Side Stepping with Counter Support - 1 x daily - 7 x weekly - 1 sets - 10 reps Body Circle Sway - 1 x daily - 7 x weekly - 1 sets - 10 reps Standing Tandem Balance with Unilateral Counter Support - 1 x daily - 7 x weekly - 1 sets - 10 reps

## 2019-12-23 ENCOUNTER — Inpatient Hospital Stay: Payer: Medicare Other | Attending: Hematology

## 2019-12-23 ENCOUNTER — Inpatient Hospital Stay: Payer: Medicare Other

## 2019-12-23 ENCOUNTER — Inpatient Hospital Stay (HOSPITAL_BASED_OUTPATIENT_CLINIC_OR_DEPARTMENT_OTHER): Payer: Medicare Other | Admitting: Hematology

## 2019-12-23 ENCOUNTER — Other Ambulatory Visit: Payer: Self-pay

## 2019-12-23 ENCOUNTER — Other Ambulatory Visit: Payer: Self-pay | Admitting: *Deleted

## 2019-12-23 VITALS — BP 149/73 | HR 73 | Temp 97.5°F | Resp 18 | Ht 64.0 in | Wt 114.0 lb

## 2019-12-23 DIAGNOSIS — Z853 Personal history of malignant neoplasm of breast: Secondary | ICD-10-CM | POA: Diagnosis not present

## 2019-12-23 DIAGNOSIS — C8440 Peripheral T-cell lymphoma, not classified, unspecified site: Secondary | ICD-10-CM | POA: Diagnosis not present

## 2019-12-23 DIAGNOSIS — Z7189 Other specified counseling: Secondary | ICD-10-CM

## 2019-12-23 DIAGNOSIS — Z95828 Presence of other vascular implants and grafts: Secondary | ICD-10-CM

## 2019-12-23 DIAGNOSIS — I2699 Other pulmonary embolism without acute cor pulmonale: Secondary | ICD-10-CM | POA: Insufficient documentation

## 2019-12-23 DIAGNOSIS — Z5111 Encounter for antineoplastic chemotherapy: Secondary | ICD-10-CM

## 2019-12-23 DIAGNOSIS — J189 Pneumonia, unspecified organism: Secondary | ICD-10-CM | POA: Diagnosis not present

## 2019-12-23 DIAGNOSIS — C865 Angioimmunoblastic T-cell lymphoma: Secondary | ICD-10-CM | POA: Insufficient documentation

## 2019-12-23 DIAGNOSIS — Z5112 Encounter for antineoplastic immunotherapy: Secondary | ICD-10-CM | POA: Diagnosis not present

## 2019-12-23 LAB — CMP (CANCER CENTER ONLY)
ALT: 11 U/L (ref 0–44)
AST: 17 U/L (ref 15–41)
Albumin: 4.1 g/dL (ref 3.5–5.0)
Alkaline Phosphatase: 66 U/L (ref 38–126)
Anion gap: 8 (ref 5–15)
BUN: 20 mg/dL (ref 8–23)
CO2: 25 mmol/L (ref 22–32)
Calcium: 10.2 mg/dL (ref 8.9–10.3)
Chloride: 107 mmol/L (ref 98–111)
Creatinine: 0.71 mg/dL (ref 0.44–1.00)
GFR, Est AFR Am: >60 mL/min (ref >60)
GFR, Estimated: >60 mL/min (ref >60)
Glucose, Bld: 86 mg/dL (ref 70–99)
Potassium: 4 mmol/L (ref 3.5–5.1)
Sodium: 140 mmol/L (ref 135–145)
Total Bilirubin: 0.6 mg/dL (ref 0.3–1.2)
Total Protein: 6.9 g/dL (ref 6.5–8.1)

## 2019-12-23 LAB — CBC WITH DIFFERENTIAL (CANCER CENTER ONLY)
Abs Immature Granulocytes: 0.01 10*3/uL (ref 0.00–0.07)
Basophils Absolute: 0 10*3/uL (ref 0.0–0.1)
Basophils Relative: 1 %
Eosinophils Absolute: 0.1 10*3/uL (ref 0.0–0.5)
Eosinophils Relative: 3 %
HCT: 41.1 % (ref 36.0–46.0)
Hemoglobin: 13.4 g/dL (ref 12.0–15.0)
Immature Granulocytes: 0 %
Lymphocytes Relative: 21 %
Lymphs Abs: 0.8 10*3/uL (ref 0.7–4.0)
MCH: 29.1 pg (ref 26.0–34.0)
MCHC: 32.6 g/dL (ref 30.0–36.0)
MCV: 89.2 fL (ref 80.0–100.0)
Monocytes Absolute: 0.6 10*3/uL (ref 0.1–1.0)
Monocytes Relative: 15 %
Neutro Abs: 2.3 10*3/uL (ref 1.7–7.7)
Neutrophils Relative %: 60 %
Platelet Count: 205 10*3/uL (ref 150–400)
RBC: 4.61 MIL/uL (ref 3.87–5.11)
RDW: 15.5 % (ref 11.5–15.5)
WBC Count: 3.9 10*3/uL — ABNORMAL LOW (ref 4.0–10.5)
nRBC: 0 % (ref 0.0–0.2)

## 2019-12-23 LAB — LACTATE DEHYDROGENASE: LDH: 187 U/L (ref 98–192)

## 2019-12-23 MED ORDER — ACETAMINOPHEN 325 MG PO TABS
650.0000 mg | ORAL_TABLET | Freq: Once | ORAL | Status: AC
  Administered 2019-12-23: 650 mg via ORAL

## 2019-12-23 MED ORDER — SODIUM CHLORIDE 0.9 % IV SOLN
1.8000 mg/kg | Freq: Once | INTRAVENOUS | Status: AC
  Administered 2019-12-23: 100 mg via INTRAVENOUS
  Filled 2019-12-23: qty 20

## 2019-12-23 MED ORDER — ONDANSETRON HCL 4 MG/2ML IJ SOLN
4.0000 mg | Freq: Once | INTRAMUSCULAR | Status: AC
  Administered 2019-12-23: 4 mg via INTRAVENOUS

## 2019-12-23 MED ORDER — DIPHENHYDRAMINE HCL 50 MG/ML IJ SOLN
INTRAMUSCULAR | Status: AC
  Filled 2019-12-23: qty 1

## 2019-12-23 MED ORDER — DEXAMETHASONE SODIUM PHOSPHATE 10 MG/ML IJ SOLN
INTRAMUSCULAR | Status: AC
  Filled 2019-12-23: qty 1

## 2019-12-23 MED ORDER — SODIUM CHLORIDE 0.9% FLUSH
10.0000 mL | Freq: Once | INTRAVENOUS | Status: AC
  Administered 2019-12-23: 10 mL
  Filled 2019-12-23: qty 10

## 2019-12-23 MED ORDER — ACETAMINOPHEN 325 MG PO TABS
ORAL_TABLET | ORAL | Status: AC
  Filled 2019-12-23: qty 2

## 2019-12-23 MED ORDER — HEPARIN SOD (PORK) LOCK FLUSH 100 UNIT/ML IV SOLN
500.0000 [IU] | Freq: Once | INTRAVENOUS | Status: AC | PRN
  Administered 2019-12-23: 500 [IU]
  Filled 2019-12-23: qty 5

## 2019-12-23 MED ORDER — FAMOTIDINE IN NACL 20-0.9 MG/50ML-% IV SOLN
20.0000 mg | Freq: Once | INTRAVENOUS | Status: AC
  Administered 2019-12-23: 20 mg via INTRAVENOUS

## 2019-12-23 MED ORDER — FAMOTIDINE IN NACL 20-0.9 MG/50ML-% IV SOLN
INTRAVENOUS | Status: AC
  Filled 2019-12-23: qty 50

## 2019-12-23 MED ORDER — DIPHENHYDRAMINE HCL 50 MG/ML IJ SOLN
50.0000 mg | Freq: Once | INTRAMUSCULAR | Status: AC
  Administered 2019-12-23: 50 mg via INTRAVENOUS

## 2019-12-23 MED ORDER — ONDANSETRON HCL 4 MG/2ML IJ SOLN
INTRAMUSCULAR | Status: AC
  Filled 2019-12-23: qty 2

## 2019-12-23 MED ORDER — SODIUM CHLORIDE 0.9% FLUSH
10.0000 mL | INTRAVENOUS | Status: DC | PRN
  Administered 2019-12-23: 10 mL
  Filled 2019-12-23: qty 10

## 2019-12-23 MED ORDER — DEXAMETHASONE SODIUM PHOSPHATE 10 MG/ML IJ SOLN
5.0000 mg | Freq: Once | INTRAMUSCULAR | Status: AC
  Administered 2019-12-23: 5 mg via INTRAVENOUS

## 2019-12-23 MED ORDER — SODIUM CHLORIDE 0.9 % IV SOLN
Freq: Once | INTRAVENOUS | Status: AC
  Filled 2019-12-23: qty 250

## 2019-12-23 NOTE — Progress Notes (Signed)
HEMATOLOGY/ONCOLOGY CLINIC NOTE  Date of Service: 12/23/2019  Patient Care Team: Lajean Manes, MD as PCP - General (Internal Medicine) Buford Dresser, MD as PCP - Cardiology (Cardiology)  REFERRING PHYSICIAN: Lajean Manes, MD  CHIEF COMPLAINTS/PURPOSE OF CONSULTATION:  Continue mx of Recently Diagnosed Angioimmunoblastic T cell lymphoma      HISTORY OF PRESENTING ILLNESS:   HAIDYN KILBURG is a wonderful 86 y.o. female who has been referred to [place] by Lajean Manes, MD for evaluation and management of Possible lymphoma . She is accompanied today by [name] . The pt reports that she is doing well overall.   Pending Surgical pathology from 03/17/2019 Dr.Manny will contact clinic about pathology   August 25th/27th she began coughing and sneezing. On Sept 3 went to doctors and they said it was allergies. She began having rashes (little red dots). Went to PCP and they prescribed prednisone and for several days and the medication helped. But as soon as she completed the prescription symptoms reoccurred.   She had a chest X Ray done because she was having a severe cough on 02/17/2019 revealing "bilateral effusions in this patient with history of breast cancer,of uncertain significance with question of right lower lobe pulmonary nodule, consider chest CT for further assessment. Atherosclerotic changes in the thoracic aorta."  Dr. Amy Martinique biopsied the rashes on her back 02/21/2019 and it was determined that it could be because of an allergy to medication. Dr.Stoneking stopped her blood pressure medication from September- October but ther rash still present. The itching started 1 to 2 months ago. The rash would come and go and was mostly on her back. She still has rash on her left thigh.  She is using triaconazole and cetrizine.  She has lumps behind ear and abdomin and she states that they have swollen twice the size within 1 and half months  She has no appetite and  has changes in her taste that began around August. She has changes in bowels. They are more loose and smaller In size. She also has no control over bowels.  She feels fatigued and it has increased since August.   She has a cough that starts in September  that has made her hoarse.   She had a CT scan on 02/27/2019 because she was having severe cough and her stomach was very extended. Revealing "1. Extensive lymphadenopathy throughout the chest, abdomen, and pelvis, with index lymph nodes identified above. Splenomegaly. Findings are most consistent with lymphoma with recurrent, metastatic breast malignancy less favored. 2. There is generally osteopenic appearance of the skeleton. There are numerous subtle hypodense lesions, particularly of the pelvis (e.g. Series 2, image 88) and select vertebral bodies, for example L4 (series 8, image 92). This is suspicious although not definitive for osseous metastatic disease. Characterization for metabolic activity by nuclear scintigraphic bone scan or PET-CT would be helpful to further evaluate. 3. Moderate right, small left pleural effusions with associated atelectasis or consolidation. Subpleural radiation fibrosis of the anterior right lung. 4. There is a new subpleural pulmonary nodule of the anterior right middle lobe measuring 6 mm (series 4, image 105), nonspecific. There is no obvious pleural thickening or nodularity definitive for metastatic disease. 5.  Small volume ascites. 6. There is a fluid attenuation lesion of the pancreatic uncinate measuring 2.4 x 1.3 cm (series 2, image 63), not substantially changed when compared to remote prior MR examination dated 02/23/2012 and likely sequelae of a prior pseudocyst versus incidental pancreatic IPMN. 7.  Cardiomegaly."  Hospitalized from 03/06/2019-03/08/2019. She presenting with acute shortness of breath, leg swelling,left hand swelling and some weight gain despite poor appetite. In ED, CTA chest revealed at least  submassive PE, moderate right pleural effusion, diffuse LAD concerning for lymphoma. Started on heparin drip. PCCM consulted for guidance. She had thoracocentesis with removal of 1200 cc cloudy exudative pleural fluid. Cardiology consulted for elevated which was thought to be demand ischemia from right heart strain in the setting of PE. Patient was transitioned to subcu Lovenox by pulmonology the next day and remained stable. Unusual appearance of tracheal air column at the thoracic inlet. Correlate with any history of swallowing difficulty or changes in voice with dedicated imaging with chest CT and or CT of the neck as indicated.  She has a nonhealing ulcer on her right tibialis anterior that starting weeping one week ago. Of note since the patient's last visit, pt has had CHEST XRAY - 2 VIEW (Accession [id number]) completed on 02/17/2019 with results revealing "Bilateral effusions in this patient with history of breast cancer,of uncertain significance with question of right lower lobepulmonary nodule, consider chest CT for furtherassessment.Unusual appearance of tracheal air column at the thoracic inlet.Correlate with any history of swallowing difficulty or changes in voice with dedicated imaging with chest CT and or CT of the neck as Indicated. Atherosclerotic changes in the thoracic aorta."  Of note since the patient's last visit, pt has had CT CHEST, ABDOMEN, AND PELVIS WITH CONTRAST (Accession [id number]) completed on 02/27/2019 with results revealing "1. Extensive lymphadenopathy throughout the chest, abdomen, and pelvis, with index lymph nodes identified above. Splenomegaly.Findings are most consistent with lymphoma with recurrent, metastatic breast malignancy less favored. 2. There is generally osteopenic appearance of the skeleton. There are numerous subtle hypodense lesions, particularly of the pelvis (e.g. Series 2, image 88) and select vertebral bodies, for example L4 (series 8, image 92).  This is suspicious although not definitive for osseous metastatic disease. Characterization for metabolic activity by nuclear scintigraphic bone scan or PET-CT would be helpful to further evaluate.  3. Moderate right, small left pleural effusions with associated atelectasis or consolidation. Subpleural radiation fibrosis of the anterior right lung.4. There is a new subpleural pulmonary nodule of the anterior right middle lobe measuring 6 mm (series 4, image 105), nonspecific. There is no obvious pleural thickening or nodularity definitive for metastatic disease.5.  Small volume ascites. 6. There is a fluid attenuation lesion of the pancreatic uncinate measuring 2.4 x 1.3 cm (series 2, image 63), not substantially changed when compared to remote prior MR examination dated 02/23/2012 and likely sequelae of a prior pseudocyst versus incidental pancreatic IPMN.7.  Cardiomegaly.8. Other chronic and incidental findings as detailed above. Aortic Atherosclerosis (ICD10-I70.0)."  Of note since the patient's last visit, pt has had PORTABLE CHEST 1 VIEW (Accession [id number]) completed on 03/06/2019 with results revealing "1. Moderate size right and small left pleural effusions appear not significantly changed since 02/27/19. 2. No new cardiopulmonary abnormality."  Of note since the patient's last visit, pt has had ECHOCARDIOGRAM  completed on 03/06/2019 with results revealing  "1. Left ventricular ejection fraction, by visual estimation, is 60 to 65%. The left ventricle has normal function. Normal left ventricular size. There is no left ventricular hypertrophy. 2. Left ventricular diastolic Doppler parameters are consistent with impaired relaxation pattern of LV diastolic filling. 3. Global right ventricle has mildly reduced systolic function.The right ventricular size is moderately enlarged. No increase in right ventricular wall thickness. 4. Left atrial size was normal.  5. Right atrial size was mildly dilated.  6. Trivial pericardial effusion is present. 7. The mitral valve is normal in structure. Trace mitral valve regurgitation. No evidence of mitral stenosis. 8. The tricuspid valve is normal in structure. Tricuspid valve regurgitation severe. 9. The aortic valve is normal in structure. Aortic valve regurgitation was not visualized by color flow Doppler. Structurally normal aortic valve, with no evidence of sclerosis or stenosis.10. The pulmonic valve was normal in structure. Pulmonic valve regurgitation is mild by color flow Doppler.11. Mildly elevated pulmonary artery systolic pressure.12. The inferior vena cava is normal in size with greater than 50% respiratory variability, suggesting right atrial pressure of 3 mmHg."  Of note since the patient's last visit, pt has had CT ANGIOGRAPHY CHEST WITH CONTRAST (Accession [id number]) completed on 03/06/2019 with results revealing "1. Pulmonary embolus arising from the distal left main pulmonary artery with extension into multiple left lower lobe pulmonary arterial branches. There is also incompletely obstructing pulmonary embolus in the proximal left upper lobe pulmonary artery. More distal pulmonary embolus in the right lower lobe pulmonary artery. Positive for acute PE with CT evidence of right heart strain (RV/LV Ratio = 1.6) consistent with at least submassive (intermediate risk) PE. The presence of right heart strain has been associated with an increased risk of morbidity and mortality. Please activate Code PE by paging [phone]. 2. Sizable pleural effusion on the right with smaller pleural effusion on the left. Consolidation and compressive atelectasis throughout most of the right lower lobe. Milder atelectasis left base. Nodular opacities on the right, likely metastatic foci.3.  Stable adenopathy compared to 1 week prior.4. Enlargement of spleen, incompletely visualized but documented CT 1 week prior. Concern for potential lymphoma. 5. Aortic atherosclerosis.  No aneurysm or dissection evident. Foci of coronary artery calcification noted."  Most recent lab results (03/06/2019) of CBC is as follows: all values are WNL except for Platelets at 72, Eosinophils Absolute at 0.6,Abs Immature Granulocytes at 0.08 .  On review of systems, pt reports rashes, digestive issues and denies back pain, abdominal pain and any other symptoms.   On PMHx the pt reports Hypothyroidism, MCI, Hypercholesterolemia, Tracheal anomaly, Hypertension, Pulmonary nodule, Atherosclerosis of aorta, anxiety, gait disorder, primary insomnia.   INTERVAL HISTORY: KEANDRIA BERROCAL is a 86 y.o. female here for evaluation and management of Angioimmunoblastic T-cell lymphoma. We are joined today by her daughter, [name]. The patient's last visit with [place] was on 12/02/2019. The pt reports that she is doing well overall.  The pt reports that her cough and rash have resolved, but she has been having more trouble with her memory. Pt has continued taking Lorazepam and has not yet started taking Celexa. She believes that she is eating well, but her daughter does not agree. She is sleeping well with the use of Ambien, which she has been taking for over 10 years.   Lab results today (12/23/19) of CBC w/diff and CMP is as follows: all values are WNL except for WBC at 3.9K. 12/23/2019 LDH at 187  On review of systems, pt reports memory issues, anxiety and denies rash, fevers, chills, night sweats, cough and any other symptoms.   MEDICAL HISTORY:  Past Medical History:  Diagnosis Date  . Allergy    codeine, thiazides  . Anxiety    new dx  . Arthritis   . Breast cancer ([place]) 03/14/12   bx=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,ER/PR=+,upper inner quad  . Cancer ([place])    breast  . Cataract   .  DVT (deep venous thrombosis) ([place]) 02/2019   left leg  . Dyspnea   . Edema    both legs feet and toe, abdomen  . Glaucoma    laser treated years ago  . HOH (hard of hearing)   .  Hyperlipemia   . Hypertension   . Hypothyroidism   . Pancreatic cyst    benign  . PONV (postoperative nausea and vomiting)   . Pulmonary embolism ([place]) 02/2019   bilateral   . Radiation 06/11/2012-07/12/2012   17 sessions 4250 cGy, 3 sessions 750 cGy  . Vertigo   . Wears glasses   . Wears partial dentures    partial upper     SURGICAL HISTORY: Past Surgical History:  Procedure Laterality Date  . ABDOMINAL HYSTERECTOMY  1966   1/2 ovary left in   . BREAST SURGERY  1992   lumpectomy-lt  . CATARACT EXTRACTION     b/l  . COLONOSCOPY    . EXCISION MASS NECK Left 03/17/2019    EXCISION MASS NECK (Left Neck)  . EXCISION MASS NECK Left 03/17/2019   Procedure: EXCISION MASS NECK;  Surgeon: Helayne Seminole, MD;  Location: [facility];  Service: ENT;  Laterality: Left;  . EYE SURGERY Bilateral    bilateral cataract removal  . IR IMAGING GUIDED PORT INSERTION  04/23/2019  . JOINT REPLACEMENT  2013   rt total knee  . JOINT REPLACEMENT  1995   lt total knee  . PARTIAL MASTECTOMY WITH NEEDLE LOCALIZATION  04/09/2012   Procedure: PARTIAL MASTECTOMY WITH NEEDLE LOCALIZATION;  Surgeon: Adin Hector, MD;  Location: [facility];  Service: General;  Laterality: Right;  . SKIN BIOPSY Left 03/17/2019   LEFT THIGH  . SKIN BIOPSY Left 03/17/2019   Procedure: Skin Biopsy Left Thigh;  Surgeon: Helayne Seminole, MD;  Location: [facility];  Service: ENT;  Laterality: Left;  . TONSILLECTOMY    . TOTAL KNEE ARTHROPLASTY  08/16/2011   Procedure: TOTAL KNEE ARTHROPLASTY;  Surgeon: Ninetta Lights, MD;  Location: [facility];  Service: Orthopedics;  Laterality: Right;     SOCIAL HISTORY: Social History   Socioeconomic History  . Marital status: Widowed    Spouse name: Not on file  . Number of children: 2  . Years of education: Not on file  . Highest education level: Not on file  Occupational History  . Occupation: Retired    Comment: Community education officer   Tobacco Use  . Smoking  status: Never Smoker  . Smokeless tobacco: Never Used  Vaping Use  . Vaping Use: Never used  Substance and Sexual Activity  . Alcohol use: No  . Drug use: No  . Sexual activity: Not Currently  Other Topics Concern  . Not on file  Social History Narrative  . Not on file   Social Determinants of Health   Financial Resource Strain:   . Difficulty of Paying Living Expenses:   Food Insecurity:   . Worried About Charity fundraiser in the Last Year:   . Arboriculturist in the Last Year:   Transportation Needs:   . Film/video editor (Medical):   Marland Kitchen Lack of Transportation (Non-Medical):   Physical Activity:   . Days of Exercise per Week:   . Minutes of Exercise per Session:   Stress:   . Feeling of Stress :   Social Connections:   . Frequency of Communication with Friends and Family:   . Frequency of Social Gatherings with  Friends and Family:   . Attends Religious Services:   . Active Member of Clubs or Organizations:   . Attends Archivist Meetings:   Marland Kitchen Marital Status:   Intimate Partner Violence:   . Fear of Current or Ex-Partner:   . Emotionally Abused:   Marland Kitchen Physically Abused:   . Sexually Abused:      FAMILY HISTORY: Family History  Problem Relation Age of Onset  . Heart disease Father   . Cancer Maternal Aunt        stomach     ALLERGIES:   is allergic to codeine, latex, and thiazide-type diuretics.   MEDICATIONS:  Current Outpatient Medications  Medication Sig Dispense Refill  . amLODipine (NORVASC) 2.5 MG tablet Take 2.5-5 mg by mouth See admin instructions. 5 mg in the morning, 2.5 mg in the evening    . amoxicillin (AMOXIL) 500 MG capsule Take 4 capsules (2,000 mg total) by mouth once as needed for up to 1 dose. 60 mins prior to dental procedure 4 capsule 0  . apixaban (ELIQUIS) 5 MG TABS tablet Take 1 tablet (5 mg total) by mouth 2 (two) times daily. 180 tablet 0  . Cholecalciferol (VITAMIN D) 50 MCG (2000 UT) tablet Take 2,000 Units by  mouth 2 (two) times daily.    . ciprofloxacin (CIPRO) 500 MG tablet Take 1 tablet (500 mg total) by mouth 2 (two) times daily. 10 tablet 0  . citalopram (CELEXA) 10 MG tablet Take 1 tablet (10 mg total) by mouth daily. 30 tablet 1  . Cyanocobalamin (VITAMIN B-12 PO) Take 3,000 mcg by mouth daily.     Marland Kitchen erythromycin ophthalmic ointment SMARTSIG:0.25 Inch(es) In Eye(s) Every Night    . gabapentin (NEURONTIN) 100 MG capsule Take 2 capsules (200 mg total) by mouth at bedtime. (Patient not taking: Reported on 07/16/2019) 60 capsule 2  . hydrocortisone 2.5 % cream APPLY CREAM TO FACE TWICE DAILY (MORNING AND EVENING )    . levothyroxine (SYNTHROID) 25 MCG tablet Take 25 mcg by mouth daily before breakfast.     . lidocaine-prilocaine (EMLA) cream Apply to affected area once 30 g 3  . LORazepam (ATIVAN) 0.5 MG tablet Take 0.5-1 tablets (0.25-0.5 mg total) by mouth every 8 (eight) hours. (Patient not taking: Reported on 10/22/2019) 30 tablet 0  . Multiple Vitamins-Minerals (PRESERVISION AREDS PO) Take 1 capsule by mouth 2 (two) times daily.     . ondansetron (ZOFRAN) 4 MG tablet Take 4 mg by mouth 2 (two) times daily as needed.    . ondansetron (ZOFRAN) 8 MG tablet Take 1 tablet (8 mg total) by mouth 2 (two) times daily as needed. Start on the third day after chemotherapy. 30 tablet 1  . polyethylene glycol (MIRALAX / GLYCOLAX) 17 g packet Take 17 g by mouth daily.    . prochlorperazine (COMPAZINE) 10 MG tablet Take 1 tablet (10 mg total) by mouth every 6 (six) hours as needed (Nausea or vomiting). 30 tablet 1  . triamcinolone cream (KENALOG) 0.1 % Apply topically 2 (two) times daily.    Marland Kitchen XIIDRA 5 % SOLN     . zolpidem (AMBIEN) 5 MG tablet Take 5 mg by mouth at bedtime as needed.     No current facility-administered medications for this visit.     REVIEW OF SYSTEMS:   A 10+ POINT REVIEW OF SYSTEMS WAS OBTAINED including neurology, dermatology, psychiatry, cardiac, respiratory, lymph, extremities, GI,  GU, Musculoskeletal, constitutional, breasts, reproductive, HEENT.  All pertinent positives are  noted in the HPI.  All others are negative.   PHYSICAL EXAMINATION: ECOG FS:1 - Symptomatic but completely ambulatory  Vitals:   12/23/19 1035  BP: (!) 149/73  Pulse: 73  Resp: 18  Temp: (!) 97.5 F (36.4 C)  SpO2: 99%   Wt Readings from Last 3 Encounters:  12/23/19 114 lb (51.7 kg)  12/02/19 116 lb 4.8 oz (52.8 kg)  11/10/19 117 lb 3.2 oz (53.2 kg)   Body mass index is 19.57 kg/m.    GENERAL:alert, in no acute distress and comfortable SKIN: no acute rashes, no significant lesions EYES: conjunctiva are pink and non-injected, sclera anicteric OROPHARYNX: MMM, no exudates, no oropharyngeal erythema or ulceration NECK: supple, no JVD LYMPH:  no palpable lymphadenopathy in the cervical, axillary or inguinal regions LUNGS: clear to auscultation b/l with normal respiratory effort HEART: regular rate & rhythm ABDOMEN:  normoactive bowel sounds , non tender, not distended. No palpable hepatosplenomegaly.  Extremity: no pedal edema PSYCH: alert & oriented x 3 with fluent speech NEURO: no focal motor/sensory deficits  LABORATORY DATA:  I have reviewed the data as listed  CBC Latest Ref Rng & Units 12/23/2019 12/02/2019 11/10/2019  WBC 4.0 - 10.5 K/uL 3.9(L) 3.6(L) 4.6  Hemoglobin 12.0 - 15.0 g/dL 13.4 12.9 12.3  Hematocrit 36 - 46 % 41.1 39.8 38.0  Platelets 150 - 400 K/uL 205 192 191    CMP Latest Ref Rng & Units 12/23/2019 12/02/2019 11/10/2019  Glucose 70 - 99 mg/dL 86 84 118(H)  BUN 8 - 23 mg/dL 20 18 22   Creatinine 0.44 - 1.00 mg/dL 0.71 0.67 0.74  Sodium 135 - 145 mmol/L 140 142 143  Potassium 3.5 - 5.1 mmol/L 4.0 3.8 3.8  Chloride 98 - 111 mmol/L 107 107 107  CO2 22 - 32 mmol/L 25 26 26   Calcium 8.9 - 10.3 mg/dL 10.2 9.6 9.5  Total Protein 6.5 - 8.1 g/dL 6.9 6.6 6.4(L)  Total Bilirubin 0.3 - 1.2 mg/dL 0.6 0.5 0.4  Alkaline Phos 38 - 126 U/L 66 66 77  AST 15 - 41 U/L 17 13(L)  14(L)  ALT 0 - 44 U/L 11 12 13    Component     Latest Ref Rng & Units 03/27/2019  Hepatitis B Surface Ag     NON REACTIVE NON REACTIVE  Hep B Core Total Ab     NON REACTIVE NON REACTIVE  HCV Ab     NON REACTIVE NON REACTIVE  LDH     98 - 192 U/L 252 (H)   . Lab Results  Component Value Date   LDH 187 12/23/2019      04/08/2019 NM PET Image Initial (PI) Skull Base To Thigh (Accession [id number])  04/04/2019 PATHOLOGY   04/07/2019 ECHOCARDIOGRAM  03/28/2019 PATHOLOGY   03/06/2019 CT ANGIOGRAPHY CHEST WITH CONTRAST (Accession [id number])  04/07/2019 ECHOCARDIOGRAM  03/06/2019 ECHOCARDIOGRAM     03/06/2019 PORTABLE CHEST 1 VIEW (Accession [id number])    02/27/2019 CT CHEST, ABDOMEN, AND PELVIS WITH CONTRAST (Accession [id number])    02/17/2019 CHEST XRAY - 2 VIEW (Accession [id number])    RADIOGRAPHIC STUDIES: I have personally reviewed the radiological images as listed and agreed with the findings in the report. NM PET Image Restag (PS) Skull Base To Thigh  Result Date: 11/25/2019 CLINICAL DATA:  Subsequent treatment strategy for T-cell lymphoma. EXAM: NUCLEAR MEDICINE PET SKULL BASE TO THIGH TECHNIQUE: 6.6 mCi F-18 FDG was injected intravenously. Full-ring PET imaging was performed from the skull base to thigh after the radiotracer. CT  data was obtained and used for attenuation correction and anatomic localization. Fasting blood glucose: 89 mg/dl COMPARISON:  09/16/2019 FINDINGS: Mediastinal blood pool activity: SUV max 1.7 Liver activity: SUV max 3.0 NECK: Asymmetric hypermetabolism study has seen in the posterior right almost completely nasopharynx on the previous resolved with SUV max = 3.6 today compared to 18 previously. The soft tissue fullness in this region has decreased on CT imaging. Similar diffuse thyroid uptake, left greater than right. Incidental CT findings: none CHEST: Hypermetabolism in the right hilar lymph node is markedly improved with SUV max = 2.5  today compared to 5.7 previously. No new suspicious hypermetabolic disease in the chest. Incidental CT findings: Right Port-A-Cath tip is positioned at the SVC/RA junction. Surgical changes noted right breast. Coronary artery calcification is evident. Atherosclerotic calcification is noted in the wall of the thoracic aorta. 6 mm right middle lobe perifissural nodule on 69/4 is stable. The subpleural 6 mm right lower lobe nodule (78/4) was 5 mm previously. No hypermetabolism. ABDOMEN/PELVIS: The hypermetabolic hepatoduodenal ligament lymph node seen on the previous study has resolved with no hypermetabolic focus in this region above background soft tissue levels on today's study. No new hypermetabolic disease in the abdomen or pelvis. Incidental CT findings: Large left renal cyst again noted. There is abdominal aortic atherosclerosis without aneurysm. Diverticular changes noted left colon without diverticulitis. Large stool volume evident. SKELETON: No focal hypermetabolic activity to suggest skeletal metastasis. Incidental CT findings: No worrisome lytic or sclerotic osseous abnormality. Sclerotic lesion upper thoracic vertebral body is stable without hypermetabolism. IMPRESSION: 1. Marked interval improvement in the hypermetabolic disease identified previously in the posterior right nasopharynx, compatible with Deauville 4 today. 2. Decreased hypermetabolism in the right hilar and porta hepatis lymph nodes identified previously ( Deauville 3). No new sites of hypermetabolic disease in the chest, abdomen, or pelvis. 3. Similar diffuse thyroid uptake, left greater than right. 4. No substantial change in right-sided pulmonary nodules. Continued attention on follow-up recommended. 5.  Aortic Atherosclerois (ICD10-170.0) Electronically Signed   By: Misty Stanley M.D.   On: 11/25/2019 14:49     ASSESSMENT & PLAN:   AMENA DOCKHAM is a 86 y.o. female with:  1. Relapsed/refractory Stage 4 Non Hodgkin Lymphoma  Angioimmunoblastic T-Cell Lymphoma. 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 SKIN, LEFT THIGH, BIOPSY: - Involvement by angioimmunoblastic T-cell lymphoma." -04/07/2019 Echocardiogram - normal EF -04/08/2019 NM PET Image Initial (PI) Skull Base To Thigh (Accession [id number]) revealed "1. Widespread hypermetabolic adenopathy in the neck, chest, and abdomen/pelvis, primarily in the Deauville 4 and Deauville 5 range. There is also hypermetabolic activity along the posterior nasopharynx in lingual tonsillar regions potentially indicating sites of involvement. 2. The subtle hypodensities in the spine and bony pelvis are not appreciably hypermetabolic may be incidental, surveillance is suggested. 3. Bilateral thyroid activity but especially on the left. Probably from thyroiditis. 4. A 6 mm subpleural nodule anteriorly in the right lower lobe not appreciably hypermetabolic but is below sensitive PET-CT size thresholds and merits surveillance. 5. Mildly accentuated diffuse splenic activity without splenomegaly or focal splenic lesion identified. 6. Other imaging findings of potential clinical significance: Aortic Atherosclerosis (ICD10-I70.0). Coronary atherosclerosis. Large right and small left pleural effusions. Moderate cardiomegaly. Small pericardial effusion. Mild mesenteric edema. Large left renal cyst. Fullness of both renal collecting systems with suspected nonobstructive left nephrolithiasis. Degenerative grade 1 anterolisthesis at L4-5 at L5-S1. Trace pelvic ascites." -05/28/2019 PET/CT ([id number]) revealed "Complete metabolic response to therapy". -09/16/2019  PET/CT ([id number]) revealed "1. Unfortunately evidence of lymphoma recurrence at sites of previous hypermetabolic adenopathy. Hypermetabolic lymphoid tissue is new from comparison PET-CT 05/28/2019 but at same locations as PET-CT 04/08/2019. The number of metastatic lymph  nodes is less than 04/08/2019. Lymphoid tissue at the RIGHT skull base is larger. 2. Three foci of hypermetabolic recurrence are present, lymphoid tissue at the RIGHT base of skull, hypermetabolic RIGHT hilar lymph node and hypermetabolic lymph node in the porta hepatis." -11/25/2019 PET/CT ([id number]) revealed "1. Marked interval improvement in the hypermetabolic disease identified previously in the posterior right nasopharynx, compatible with Deauville 4 today. 2. Decreased hypermetabolism in the right hilar and porta hepatis lymph nodes identified previously (Deauville 3). No new sites of hypermetabolic disease in the chest, abdomen, or pelvis. 3. Similar diffuse thyroid uptake, left greater than right. 4. No substantial change in right-sided pulmonary nodules."  2. Pulmonary Embolism -extensive .likely related to extensive malignancy  3. H/o Breast Cancer  -The patient had bilateral diagnostic  mammography at [facility] 07/11/2011. This  showed some calcifications in the right  breast, which seemed a little bit more  prominent than prior. In the left breast  there was an area of possible  architectural distortion. However left  breast ultrasound the same day showed  no abnormality. 6 month followup was  suggested, and on 02/28/2012 the  patient again had bilateral diagnostic  mammography, now with right  ultrasonography. The microcalcifications  in the right breast appeared increased.  Ultrasound showed a hypoechoic lesion  measuring 5 mm, with no associated  shadowing. This had been previously  noted and appeared unchanged. Anterior  to that there was a small hypoechoic  mass measuring 4 mm in diameter.  There was felt to be suspicious, and on  03/14/2012 the patient underwent biopsy  of the right breast mass, showing ([id number]  [id number]) ductal carcinoma in situ,  intermediate grade, estrogen receptor  100% and progesterone receptor 100%  positive.   -Bilateral breast MRI was obtained  03/26/2012. This showed  only post  biopsy changes in the right breast,  associated with an area of non-masslike  enhancement measuring 2.4 cm. The  left breast was unremarkable, and there  was no enlarged axillary or internal  mammary adenopathy noted.  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 The patient's  subsequent history is as detailed below.   PLAN: -Discussed pt labwork today, 12/23/19; blood counts look good, blood chemistries are nml, LDH is WNL. 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 -The pt has no prohibitive toxicities from continuing C5 Brentuximab-Vedotin at this time. Will continue until progression or intolerance. -Advised pt that Ambien has been known to cause some confusion.  -Recommend pt write down important information and tasks and delegate as much as possible.  -Recommend pt eat as much as possible, drink 48-64 oz of water daily, and walk 20-30 minutes per day.  -Advised pt that 10 mg Celexa is the lowest dose. Recommend pt begin immediately to improve anxiety and mood.   -Continue B-complex vitamin -Will see back in 3 weeks with labs    FOLLOW-UP: Plz schedule next cycle of Brentuximab-vedotin with portflush, labs and MD visit in 3 weeks   The total time spent in the appt was 30 minutes and more than 50% was on counseling and  direct patient cares, ordering and management of chemotherapy  All of the patient's questions were answered with apparent satisfaction. The patient knows to call the clinic with any problems, questions or concerns.    Sullivan Lone MD [place] AAHIVMS [facility] Hematology/Oncology Physician [facility]  (Office):       [phone] (Work cell):  [phone] (Fax):           [phone]  12/23/2019 11:37 AM   I, Yevette Edwards, am acting as  a scribe for Dr. Sullivan Lone.   .I have reviewed the above documentation for accuracy and completeness, and I agree with the above. Brunetta Genera MD

## 2019-12-23 NOTE — Patient Instructions (Signed)
[place] Discharge Instructions for Patients Receiving Chemotherapy  Today you received the following chemotherapy agents Brentuximab vedotin (ADCETRIS).  To help prevent nausea and vomiting after your treatment, we encourage you to take your nausea medication as prescribed.   If you develop nausea and vomiting that is not controlled by your nausea medication, call the clinic.   BELOW ARE SYMPTOMS THAT SHOULD BE REPORTED IMMEDIATELY:  *FEVER GREATER THAN 100.5 F  *CHILLS WITH OR WITHOUT FEVER  NAUSEA AND VOMITING THAT IS NOT CONTROLLED WITH YOUR NAUSEA MEDICATION  *UNUSUAL SHORTNESS OF BREATH  *UNUSUAL BRUISING OR BLEEDING  TENDERNESS IN MOUTH AND THROAT WITH OR WITHOUT PRESENCE OF ULCERS  *URINARY PROBLEMS  *BOWEL PROBLEMS  UNUSUAL RASH Items with * indicate a potential emergency and should be followed up as soon as possible.  Feel free to call the clinic should you have any questions or concerns. The clinic phone number is [phone].  Please show the [place] at check-in to the Emergency Department and triage nurse.

## 2019-12-24 ENCOUNTER — Ambulatory Visit: Payer: Medicare Other | Admitting: Hematology

## 2019-12-24 ENCOUNTER — Other Ambulatory Visit: Payer: Medicare Other

## 2019-12-26 ENCOUNTER — Other Ambulatory Visit: Payer: Self-pay | Admitting: Hematology

## 2019-12-26 NOTE — Telephone Encounter (Signed)
Patient requested refill - Please review and refill if possible.

## 2019-12-29 ENCOUNTER — Other Ambulatory Visit: Payer: Self-pay | Admitting: Hematology

## 2019-12-29 ENCOUNTER — Telehealth: Payer: Self-pay | Admitting: *Deleted

## 2019-12-29 DIAGNOSIS — R059 Cough, unspecified: Secondary | ICD-10-CM

## 2019-12-29 DIAGNOSIS — C8440 Peripheral T-cell lymphoma, not classified, unspecified site: Secondary | ICD-10-CM

## 2019-12-29 MED ORDER — BENZONATATE 100 MG PO CAPS
100.0000 mg | ORAL_CAPSULE | Freq: Three times a day (TID) | ORAL | 0 refills | Status: DC | PRN
Start: 2019-12-29 — End: 2021-01-27

## 2019-12-29 NOTE — Telephone Encounter (Signed)
Daughter called office in response to earlier message left. She is taking her mother to get CXR tomorrow.

## 2019-12-29 NOTE — Telephone Encounter (Signed)
Daughter called - patient has had persistant cough over past week, then had low grade temp/nausea over weekend. Tylenol/anti emetic resolved fever and nausea -- still has cough. Also, message sent early Friday has disappeared: Patient has requested refill of Lorazepam. it's in your inbasket. Tried Celexa for ansiety, states can states it made her feel awful. Dr. Irene Limbo informed Contacted daughter [name] with Dr. Grier Mitts response: Lorazepam refilled. CXR ordered to r/p pneumonitis/pneumonia - could come in today for this and let us know when she has had it for phone review. Tessalon perles prn --sent to her pharmacy for cough. if worsening shortness or chest pain with cough or recurrent fever should go to ED. LVM with all information on named VM

## 2019-12-30 ENCOUNTER — Encounter (HOSPITAL_COMMUNITY): Payer: Self-pay | Admitting: Emergency Medicine

## 2019-12-30 ENCOUNTER — Telehealth: Payer: Self-pay | Admitting: *Deleted

## 2019-12-30 ENCOUNTER — Other Ambulatory Visit: Payer: Self-pay

## 2019-12-30 ENCOUNTER — Other Ambulatory Visit: Payer: Self-pay | Admitting: Hematology

## 2019-12-30 ENCOUNTER — Emergency Department (HOSPITAL_COMMUNITY)
Admission: EM | Admit: 2019-12-30 | Discharge: 2019-12-31 | Disposition: A | Discharge status: Home / Self Care | Payer: Medicare Other | Attending: Emergency Medicine | Admitting: Emergency Medicine

## 2019-12-30 ENCOUNTER — Ambulatory Visit (HOSPITAL_COMMUNITY)
Admission: RE | Admit: 2019-12-30 | Discharge: 2019-12-30 | Disposition: A | Discharge status: Home / Self Care | Payer: Medicare Other | Source: Ambulatory Visit | Attending: Hematology | Admitting: Hematology

## 2019-12-30 DIAGNOSIS — I10 Essential (primary) hypertension: Secondary | ICD-10-CM | POA: Diagnosis not present

## 2019-12-30 DIAGNOSIS — C8440 Peripheral T-cell lymphoma, not classified, unspecified site: Secondary | ICD-10-CM | POA: Insufficient documentation

## 2019-12-30 DIAGNOSIS — Z20822 Contact with and (suspected) exposure to covid-19: Secondary | ICD-10-CM | POA: Diagnosis not present

## 2019-12-30 DIAGNOSIS — R05 Cough: Secondary | ICD-10-CM | POA: Insufficient documentation

## 2019-12-30 DIAGNOSIS — Z7901 Long term (current) use of anticoagulants: Secondary | ICD-10-CM | POA: Insufficient documentation

## 2019-12-30 DIAGNOSIS — Z96651 Presence of right artificial knee joint: Secondary | ICD-10-CM | POA: Diagnosis not present

## 2019-12-30 DIAGNOSIS — Z91040 Latex allergy status: Secondary | ICD-10-CM | POA: Diagnosis not present

## 2019-12-30 DIAGNOSIS — Z79899 Other long term (current) drug therapy: Secondary | ICD-10-CM | POA: Diagnosis not present

## 2019-12-30 DIAGNOSIS — Z96652 Presence of left artificial knee joint: Secondary | ICD-10-CM | POA: Insufficient documentation

## 2019-12-30 DIAGNOSIS — R059 Cough, unspecified: Secondary | ICD-10-CM | POA: Diagnosis present

## 2019-12-30 DIAGNOSIS — E039 Hypothyroidism, unspecified: Secondary | ICD-10-CM | POA: Insufficient documentation

## 2019-12-30 DIAGNOSIS — Z853 Personal history of malignant neoplasm of breast: Secondary | ICD-10-CM | POA: Diagnosis not present

## 2019-12-30 DIAGNOSIS — J189 Pneumonia, unspecified organism: Secondary | ICD-10-CM | POA: Diagnosis not present

## 2019-12-30 DIAGNOSIS — R9389 Abnormal findings on diagnostic imaging of other specified body structures: Secondary | ICD-10-CM | POA: Diagnosis not present

## 2019-12-30 DIAGNOSIS — I517 Cardiomegaly: Secondary | ICD-10-CM | POA: Diagnosis not present

## 2019-12-30 MED ORDER — SODIUM CHLORIDE 0.9 % IV SOLN
2.0000 g | Freq: Once | INTRAVENOUS | Status: AC
  Administered 2019-12-31: 2 g via INTRAVENOUS
  Filled 2019-12-30: qty 2

## 2019-12-30 MED ORDER — VANCOMYCIN HCL IN DEXTROSE 1-5 GM/200ML-% IV SOLN
1000.0000 mg | Freq: Once | INTRAVENOUS | Status: DC
  Filled 2019-12-30: qty 200

## 2019-12-30 NOTE — Telephone Encounter (Signed)
Daughter [name] called - CXR completed. Mother has decreased energy and is lethargic. Has poor appetite. Dr. Irene Limbo informed of patient symptoms -- he reviewed CXR results Dr. Irene Limbo advised to take patient to hospital for evaluation. Symptoms are concerning and pneumonia seen on X-ray.  Contacted daughter - LVM for [name] and requested she contact office asap once message received. [name] contacted office -information from Dr. Irene Limbo given to her. She verbalized understanding and states will take mother to hospital.

## 2019-12-30 NOTE — Progress Notes (Signed)
CXR -- concerning for pneumonia. With low grade fever and failure to thrive patient will be informed by my RN to go to ED for IV Abx and likely admission for HCAP in an 87yo with lymphoma and immunocompromised from chemotherapy.  Sarah [name]

## 2019-12-30 NOTE — ED Triage Notes (Signed)
Sent by the cancer center, did a CXR today that showed PNA, patient has no complaints.

## 2019-12-30 NOTE — Progress Notes (Signed)
A consult was received from an ED physician for vancomycin per pharmacy dosing.  The patient's profile has been reviewed for ht/wt/allergies/indication/available labs.   A one time order has been placed for Vancomycin 1gm iv x1.  Further antibiotics/pharmacy consults should be ordered by admitting physician if indicated.                       Thank you, Nani Skillern Crowford 12/30/2019  11:46 PM

## 2019-12-30 NOTE — ED Provider Notes (Signed)
[facility] DEPT Provider Note   CSN: [id number] Arrival date & time: 12/30/19  1539     History Chief Complaint  Patient presents with  . Pneumonia    Sarah [name] is a 86 y.o. female.  The history is provided by the patient, medical records and a relative. No language interpreter was used.  Cough Cough characteristics:  Non-productive Severity:  Moderate Onset quality:  Gradual Duration:  3 days Timing:  Constant Progression:  Waxing and waning Chronicity:  New Relieved by:  Nothing Worsened by:  Nothing Ineffective treatments:  None tried Associated symptoms: chills and fever   Associated symptoms: no chest pain, no diaphoresis, no headaches, no rash, no shortness of breath and no wheezing        Past Medical History:  Diagnosis Date  . Allergy    codeine, thiazides  . Anxiety    new dx  . Arthritis   . Breast cancer ([place]) 03/14/12   bx=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,ER/PR=+,upper inner quad  . Cancer ([place])    breast  . Cataract   . DVT (deep venous thrombosis) ([place]) 02/2019   left leg  . Dyspnea   . Edema    both legs feet and toe, abdomen  . Glaucoma    laser treated years ago  . HOH (hard of hearing)   . Hyperlipemia   . Hypertension   . Hypothyroidism   . Pancreatic cyst    benign  . PONV (postoperative nausea and vomiting)   . Pulmonary embolism ([place]) 02/2019   bilateral   . Radiation 06/11/2012-07/12/2012   17 sessions 4250 cGy, 3 sessions 750 cGy  . Vertigo   . Wears glasses   . Wears partial dentures    partial upper    Patient Active Problem List   Diagnosis Date Noted  . Port-A-Cath in place 06/04/2019  . 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 Counseling regarding advance care planning and goals of care 04/14/2019  . Lymphadenopathy of head and neck 03/17/2019  . Lymph nodes enlarged 03/17/2019  . History of pulmonary embolus (PE) 03/17/2019  . Acute pulmonary  embolism ([place]) 03/06/2019  . Diffuse lymphadenopathy 03/06/2019  . Pleural effusion 03/06/2019  . Pruritus 02/06/2019  . Pruritic rash 02/06/2019  . Angio-edema 02/06/2019  . Shortness of breath 02/06/2019  . Osteopenia 12/18/2013  . Heart block AV first degree 12/18/2013  . Breast cancer of upper-inner quadrant of right female breast ([place]) 03/19/2012  . Right knee DJD 08/18/2011  . Hypothyroidism   . Hypertension   . PONV (postoperative nausea and vomiting)   . Hyperlipemia     Past Surgical History:  Procedure Laterality Date  . ABDOMINAL HYSTERECTOMY  1966   1/2 ovary left in   . BREAST SURGERY  1992   lumpectomy-lt  . CATARACT EXTRACTION     b/l  . COLONOSCOPY    . EXCISION MASS NECK Left 03/17/2019    EXCISION MASS NECK (Left Neck)  . EXCISION MASS NECK Left 03/17/2019   Procedure: EXCISION MASS NECK;  Surgeon: Helayne Seminole, MD;  Location: [facility];  Service: ENT;  Laterality: Left;  . EYE SURGERY Bilateral    bilateral cataract removal  . IR IMAGING GUIDED PORT INSERTION  04/23/2019  . JOINT REPLACEMENT  2013   rt total knee  . JOINT REPLACEMENT  1995   lt total knee  . PARTIAL MASTECTOMY WITH NEEDLE LOCALIZATION  04/09/2012   Procedure: PARTIAL MASTECTOMY WITH NEEDLE LOCALIZATION;  Surgeon: Adin Hector, MD;  Location: [facility];  Service: General;  Laterality: Right;  . SKIN BIOPSY Left 03/17/2019   LEFT THIGH  . SKIN BIOPSY Left 03/17/2019   Procedure: Skin Biopsy Left Thigh;  Surgeon: Helayne Seminole, MD;  Location: [facility];  Service: ENT;  Laterality: Left;  . TONSILLECTOMY    . TOTAL KNEE ARTHROPLASTY  08/16/2011   Procedure: TOTAL KNEE ARTHROPLASTY;  Surgeon: Ninetta Lights, MD;  Location: [facility];  Service: Orthopedics;  Laterality: Right;     OB History   No obstetric history on file.     Family History  Problem Relation Age of Onset  . Heart disease Father   . Cancer Maternal Aunt        stomach    Social History    Tobacco Use  . Smoking status: Never Smoker  . Smokeless tobacco: Never Used  Vaping Use  . Vaping Use: Never used  Substance Use Topics  . Alcohol use: No  . Drug use: No    Home Medications Prior to Admission medications   Medication Sig Start Date End Date Taking? Authorizing Provider  amLODipine (NORVASC) 2.5 MG tablet Take 2.5-5 mg by mouth See admin instructions. 5 mg in the morning, 2.5 mg in the evening 12/21/18   [provider]  amoxicillin (AMOXIL) 500 MG capsule Take 4 capsules (2,000 mg total) by mouth once as needed for up to 1 dose. 60 mins prior to dental procedure 12/02/19   Brunetta Genera, MD  apixaban (ELIQUIS) 5 MG TABS tablet Take 1 tablet (5 mg total) by mouth 2 (two) times daily. 08/18/19   Julian Hy, DO  benzonatate (TESSALON) 100 MG capsule Take 1 capsule (100 mg total) by mouth 3 (three) times daily as needed for cough. 12/29/19   Brunetta Genera, MD  Cholecalciferol (VITAMIN D) 50 MCG (2000 UT) tablet Take 2,000 Units by mouth 2 (two) times daily.    [provider]  ciprofloxacin (CIPRO) 500 MG tablet Take 1 tablet (500 mg total) by mouth 2 (two) times daily. 10/31/19   Volanda Napoleon, MD  citalopram (CELEXA) 10 MG tablet Take 1 tablet (10 mg total) by mouth daily. 12/02/19   Brunetta Genera, MD  Cyanocobalamin (VITAMIN B-12 PO) Take 3,000 mcg by mouth daily.     [provider]  erythromycin ophthalmic ointment SMARTSIG:0.25 Inch(es) In Eye(s) Every Night 10/22/19   [provider]  gabapentin (NEURONTIN) 100 MG capsule Take 2 capsules (200 mg total) by mouth at bedtime. Patient not taking: Reported on 07/16/2019 05/14/19   Sandi Mealy E., PA-C  hydrocortisone 2.5 % cream APPLY CREAM TO FACE TWICE DAILY (MORNING AND EVENING ) 09/29/19   [provider]  levothyroxine (SYNTHROID) 25 MCG tablet Take 25 mcg by mouth daily before breakfast.  09/20/18   [provider]  lidocaine-prilocaine (EMLA)  cream Apply to affected area once 04/14/19   Brunetta Genera, MD  LORazepam (ATIVAN) 0.5 MG tablet TAKE 1/2 TO 1 (ONE-HALF TO ONE) TABLET BY MOUTH EVERY 8 HOURS 12/29/19   Brunetta Genera, MD  Multiple Vitamins-Minerals (PRESERVISION AREDS PO) Take 1 capsule by mouth 2 (two) times daily.     [provider]  ondansetron (ZOFRAN) 4 MG tablet Take 4 mg by mouth 2 (two) times daily as needed. 03/04/19   [provider]  ondansetron (ZOFRAN) 8 MG tablet Take 1 tablet (8 mg total) by mouth 2 (two) times  daily as needed. Start on the third day after chemotherapy. 04/14/19   Brunetta Genera, MD  polyethylene glycol (MIRALAX / GLYCOLAX) 17 g packet Take 17 g by mouth daily.    [provider]  prochlorperazine (COMPAZINE) 10 MG tablet Take 1 tablet (10 mg total) by mouth every 6 (six) hours as needed (Nausea or vomiting). 04/14/19   Brunetta Genera, MD  triamcinolone cream (KENALOG) 0.1 % Apply topically 2 (two) times daily. 11/07/19   [provider]  XIIDRA 5 % SOLN  10/22/19   [provider]  zolpidem (AMBIEN) 5 MG tablet Take 5 mg by mouth at bedtime as needed. 12/07/19   [provider]    Allergies    Codeine, Latex, and Thiazide-type diuretics  Review of Systems   Review of Systems  Constitutional: Positive for chills, fatigue and fever. Negative for diaphoresis.  HENT: Negative for congestion.   Eyes: Negative for visual disturbance.  Respiratory: Positive for cough. Negative for chest tightness, shortness of breath and wheezing.   Cardiovascular: Negative for chest pain, palpitations and leg swelling.  Gastrointestinal: Negative for abdominal pain, constipation, diarrhea, nausea and vomiting.  Genitourinary: Negative for dysuria, flank pain and frequency.  Musculoskeletal: Negative for back pain, neck pain and neck stiffness.  Skin: Negative for rash and wound.  Neurological: Negative for light-headedness and headaches.   Psychiatric/Behavioral: Negative for agitation and confusion.  All other systems reviewed and are negative.   Physical Exam Updated Vital Signs BP 126/85   Pulse 97   Temp 99.4 F (37.4 C)   Resp 13   Ht 5\' 6"  (1.676 m)   Wt 51.7 kg   SpO2 95%   BMI 18.40 kg/m   Physical Exam Vitals and nursing note reviewed.  Constitutional:      General: She is not in acute distress.    Appearance: She is well-developed. She is not ill-appearing, toxic-appearing or diaphoretic.  HENT:     Head: Normocephalic and atraumatic.     Right Ear: External ear normal.     Left Ear: External ear normal.     Nose: Nose normal. No congestion or rhinorrhea.     Mouth/Throat:     Mouth: Mucous membranes are moist.     Pharynx: No oropharyngeal exudate or posterior oropharyngeal erythema.  Eyes:     Conjunctiva/sclera: Conjunctivae normal.     Pupils: Pupils are equal, round, and reactive to light.  Cardiovascular:     Rate and Rhythm: Normal rate.     Pulses: Normal pulses.     Heart sounds: No murmur heard.   Pulmonary:     Effort: Pulmonary effort is normal. No respiratory distress.     Breath sounds: No stridor. Rhonchi present. No wheezing or rales.  Chest:     Chest wall: No tenderness.  Abdominal:     General: Abdomen is flat. There is no distension.     Tenderness: There is no abdominal tenderness. There is no right CVA tenderness, left CVA tenderness, guarding or rebound.  Musculoskeletal:        General: No tenderness.     Cervical back: Normal range of motion and neck supple. No tenderness.     Right lower leg: No edema.     Left lower leg: No edema.  Skin:    General: Skin is warm.     Capillary Refill: Capillary refill takes less than 2 seconds.     Coloration: Skin is not pale.  Findings: No erythema or rash.  Neurological:     General: No focal deficit present.     Mental Status: She is alert and oriented to person, place, and time.     Motor: No abnormal muscle tone.      Deep Tendon Reflexes: Reflexes are normal and symmetric.  Psychiatric:        Mood and Affect: Mood normal.     ED Results / Procedures / Treatments   Labs (all labs ordered are listed, but only abnormal results are displayed) Labs Reviewed  CULTURE, BLOOD (ROUTINE X 2)  CULTURE, BLOOD (ROUTINE X 2)  SARS CORONAVIRUS 2 BY RT PCR (HOSPITAL ORDER, [facility])  CBC WITH DIFFERENTIAL/PLATELET  COMPREHENSIVE METABOLIC PANEL  LACTIC ACID, PLASMA  LACTIC ACID, PLASMA    EKG None  Radiology DG Chest 2 View  Result Date: 12/30/2019 CLINICAL DATA:  T-cell lymphoma, on chemotherapy. Cough. Question pneumonitis or pneumonia. EXAM: CHEST - 2 VIEW COMPARISON:  07/17/2019 FINDINGS: Mild pectus excavatum deformity. Midline trachea. Mild cardiomegaly. Atherosclerosis in the transverse aorta. No pleural effusion or pneumothorax. Right lower and possibly inferior right upper lobe patchy pulmonary opacities, most apparent on the frontal radiograph. The left lung is clear. Mild S shaped thoracolumbar spine curvature. IMPRESSION: Right-sided pulmonary opacities, suspicious for infection or inflammation. Recommend radiographic follow-up until clearing. Aortic Atherosclerosis (ICD10-I70.0). No thoracic adenopathy. Electronically Signed   By: Abigail Miyamoto M.D.   On: 12/30/2019 12:54    Procedures Procedures (including critical care time)  CRITICAL CARE Performed by: Gwenyth Allegra Keshav Winegar Total critical care time: 20 minutes Critical care time was exclusive of separately billable procedures and treating other patients. Critical care was necessary to treat or prevent imminent or life-threatening deterioration. Critical care was time spent personally by me on the following activities: development of treatment plan with patient and/or surrogate as well as nursing, discussions with consultants, evaluation of patient's response to treatment, examination of patient, obtaining  history from patient or surrogate, ordering and performing treatments and interventions, ordering and review of laboratory studies, ordering and review of radiographic studies, pulse oximetry and re-evaluation of patient's condition.   Medications Ordered in ED Medications  ceFEPIme (MAXIPIME) 2 g in sodium chloride 0.9 % 100 mL IVPB (has no administration in time range)  vancomycin (VANCOCIN) IVPB 1000 mg/200 mL premix (has no administration in time range)    ED Course  I have reviewed the triage vital signs and the nursing notes.  Pertinent labs & imaging results that were available during my care of the patient were reviewed by me and considered in my medical decision making (see chart for details).    MDM Rules/Calculators/A&P                          Sarah [name] is a 86 y.o. female with a past medical history significant for prior breast cancer, current lymphoma on chemotherapy, prior pulmonary embolism on Eliquis therapy, hypertension, hyperlipidemia, and vertigo who presents at the direction of her oncologist for IV antibiotics and admission for HCAP.  Patient reports that for the last several days she has had subjective chills and had a temperature around 100 at home today.  She spoke with her oncologist who told her to get a chest x-ray as she has been having a nonproductive cough as well.  Her x-ray shows new pneumonia and he told her to come to the emergency department for IV antibiotics and  admission.  Otherwise, patient does report the chills, fatigue, and cough but denies any chest pain or shortness of breath.  She denies any syncope.  She denies nausea vomiting, constipation, diarrhea, or urinary symptoms.  She otherwise resting comfortably.  On exam, she did have some rhonchi on exam with no rales or wheezing.  No murmur.  Abdomen and chest nontender.  Legs nontender nonedematous.  She denies any new leg pain or leg swelling.  She reports this does not feel anything like when  she had PE in the past.  Vital signs reassuring on arrival and she is on room air.  X-ray from earlier today does show pneumonia.  Given this recent x-ray, do not feel she needs repeat imaging at this time.  We will get screening labs as well as a Covid test.  She will be started on broad-spectrum antibiotics at the recommendation of her oncology team.  Patient will be admitted for further management of HCAP in the setting of immunosuppression from [place].  Hospitalist will admit for further management.    Final Clinical Impression(s) / ED Diagnoses Final diagnoses:  HCAP (healthcare-associated pneumonia)     Clinical Impression: 1. HCAP (healthcare-associated pneumonia)     Disposition: Admit  This note was prepared with assistance of Dragon voice recognition software. Occasional wrong-word or sound-a-like substitutions may have occurred due to the inherent limitations of voice recognition software.     Shellby Schlink, Gwenyth Allegra, MD 12/30/19 [phone]

## 2019-12-31 ENCOUNTER — Telehealth: Payer: Self-pay | Admitting: Critical Care Medicine

## 2019-12-31 DIAGNOSIS — R05 Cough: Secondary | ICD-10-CM | POA: Diagnosis not present

## 2019-12-31 DIAGNOSIS — J189 Pneumonia, unspecified organism: Secondary | ICD-10-CM | POA: Diagnosis not present

## 2019-12-31 DIAGNOSIS — R9389 Abnormal findings on diagnostic imaging of other specified body structures: Secondary | ICD-10-CM | POA: Diagnosis present

## 2019-12-31 DIAGNOSIS — R059 Cough, unspecified: Secondary | ICD-10-CM | POA: Diagnosis present

## 2019-12-31 LAB — CBC WITH DIFFERENTIAL/PLATELET
Abs Immature Granulocytes: 0.06 10*3/uL (ref 0.00–0.07)
Basophils Absolute: 0.1 10*3/uL (ref 0.0–0.1)
Basophils Relative: 1 %
Eosinophils Absolute: 0 10*3/uL (ref 0.0–0.5)
Eosinophils Relative: 0 %
HCT: 37.9 % (ref 36.0–46.0)
Hemoglobin: 12.3 g/dL (ref 12.0–15.0)
Immature Granulocytes: 1 %
Lymphocytes Relative: 9 %
Lymphs Abs: 0.8 10*3/uL (ref 0.7–4.0)
MCH: 29.4 pg (ref 26.0–34.0)
MCHC: 32.5 g/dL (ref 30.0–36.0)
MCV: 90.7 fL (ref 80.0–100.0)
Monocytes Absolute: 0.9 10*3/uL (ref 0.1–1.0)
Monocytes Relative: 10 %
Neutro Abs: 7.5 10*3/uL (ref 1.7–7.7)
Neutrophils Relative %: 79 %
Platelets: 182 10*3/uL (ref 150–400)
RBC: 4.18 MIL/uL (ref 3.87–5.11)
RDW: 15.4 % (ref 11.5–15.5)
WBC: 9.3 10*3/uL (ref 4.0–10.5)
nRBC: 0 % (ref 0.0–0.2)

## 2019-12-31 LAB — COMPREHENSIVE METABOLIC PANEL
ALT: 12 U/L (ref 0–44)
AST: 14 U/L — ABNORMAL LOW (ref 15–41)
Albumin: 3.7 g/dL (ref 3.5–5.0)
Alkaline Phosphatase: 52 U/L (ref 38–126)
Anion gap: 12 (ref 5–15)
BUN: 22 mg/dL (ref 8–23)
CO2: 25 mmol/L (ref 22–32)
Calcium: 8.9 mg/dL (ref 8.9–10.3)
Chloride: 101 mmol/L (ref 98–111)
Creatinine, Ser: 0.59 mg/dL (ref 0.44–1.00)
GFR calc Af Amer: >60 mL/min (ref >60)
GFR calc non Af Amer: >60 mL/min (ref >60)
Glucose, Bld: 142 mg/dL — ABNORMAL HIGH (ref 70–99)
Potassium: 3.7 mmol/L (ref 3.5–5.1)
Sodium: 138 mmol/L (ref 135–145)
Total Bilirubin: 1 mg/dL (ref 0.3–1.2)
Total Protein: 6.5 g/dL (ref 6.5–8.1)

## 2019-12-31 LAB — SARS CORONAVIRUS 2 BY RT PCR (HOSPITAL ORDER, PERFORMED IN ~~LOC~~ HOSPITAL LAB): SARS Coronavirus 2: NEGATIVE

## 2019-12-31 LAB — LACTIC ACID, PLASMA: Lactic Acid, Venous: 0.8 mmol/L (ref 0.5–1.9)

## 2019-12-31 MED ORDER — LIDOCAINE-PRILOCAINE 2.5-2.5 % EX CREA
TOPICAL_CREAM | Freq: Once | CUTANEOUS | Status: AC
  Filled 2019-12-31: qty 5

## 2019-12-31 MED ORDER — HEPARIN SOD (PORK) LOCK FLUSH 100 UNIT/ML IV SOLN
500.0000 [IU] | Freq: Once | INTRAVENOUS | Status: AC
  Administered 2019-12-31: 500 [IU]
  Filled 2019-12-31: qty 5

## 2019-12-31 NOTE — Telephone Encounter (Addendum)
ATC patients daughter Sarah [name]. Looks like patient was in the ED on 12/30/2019 for Pneumonia. Daughter wants to know if she needs to follow up with our office.

## 2019-12-31 NOTE — Telephone Encounter (Signed)
Spoke with patients daughter Sarah [name] she states that she is having cough, fever and chills. Went to ED yesterday.  Had CXR and Covid test yesterday. CXR showed Pneumonia and she was given Antibiotics through port. Covid test came back Negative.   Dr. Carlis Abbott would you like patient to have follow up appointment with office

## 2019-12-31 NOTE — ED Notes (Signed)
Patient requesting EMLA for port. Will access, draw blood, and medicate patient after 30-45 minutes.

## 2019-12-31 NOTE — Consult Note (Signed)
1[facility] Medical Consultation  [name] TKW:[id number] DOB: August 27, 1932 DOA: 12/30/2019 PCP: Lajean Manes, MD   Requesting physician: Dr. Leonie Man Date of consultation: 12/31/19 Reason for consultation: abnormal CXR  Impression/Recommendations Active Problems:   Abnormal chest x-ray   Cough   Hypertension    1. Abnormal CXR - hazy opacity RUL, RLL representing possible inflammation vs viral changes  2. Cough - non-productive cough  3. HTN- controlled  Recommend d/c home with patient to see Dr. Felipa Eth in 24-48 hrs. For progressive symptoms, I.e. SOB, Fever, increased weakness to return to ED  Chief Complaint: Cough  HPI:  Sarah [name], an 86 y/o woman followed by oncology for lymphoma, developed a non-productive cough, had a transient low grade fever and generalized weakness. Her oncologist sent her for a chest xray that was read as abnormal. Her oncologist's office recommended she present to ED for admission and treatment with IV abx for pneumonia  Review of Systems:  HEENT- no HA, no sinus drainage or pain, no sore throat Pul - non productive cough but w/o shortness of breath or dyspnea on exertion Cor - no chest pain or pressure, no palpitations GI - no N/V, no diarrhea MSK - no focal complaints  Past Medical History:  Diagnosis Date  . Allergy    codeine, thiazides  . Anxiety    new dx  . Arthritis   . Breast cancer ([place]) 03/14/12   bx=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,ER/PR=+,upper inner quad  . Cancer ([place])    breast  . Cataract   . DVT (deep venous thrombosis) ([place]) 02/2019   left leg  . Dyspnea   . Edema    both legs feet and toe, abdomen  . Glaucoma    laser treated years ago  . HOH (hard of hearing)   . Hyperlipemia   . Hypertension   . Hypothyroidism   . Pancreatic cyst    benign  . PONV (postoperative nausea and vomiting)   . Pulmonary embolism ([place]) 02/2019   bilateral   . Radiation  06/11/2012-07/12/2012   17 sessions 4250 cGy, 3 sessions 750 cGy  . Vertigo   . Wears glasses   . Wears partial dentures    partial upper   Past Surgical History:  Procedure Laterality Date  . ABDOMINAL HYSTERECTOMY  1966   1/2 ovary left in   . BREAST SURGERY  1992   lumpectomy-lt  . CATARACT EXTRACTION     b/l  . COLONOSCOPY    . EXCISION MASS NECK Left 03/17/2019    EXCISION MASS NECK (Left Neck)  . EXCISION MASS NECK Left 03/17/2019   Procedure: EXCISION MASS NECK;  Surgeon: Helayne Seminole, MD;  Location: [facility] [place];  Service: ENT;  Laterality: Left;  . EYE SURGERY Bilateral    bilateral cataract removal  . IR IMAGING GUIDED PORT INSERTION  04/23/2019  . JOINT REPLACEMENT  2013   rt total knee  . JOINT REPLACEMENT  1995   lt total knee  . PARTIAL MASTECTOMY WITH NEEDLE LOCALIZATION  04/09/2012   Procedure: PARTIAL MASTECTOMY WITH NEEDLE LOCALIZATION;  Surgeon: Adin Hector, MD;  Location: [place];  Service: General;  Laterality: Right;  . SKIN BIOPSY Left 03/17/2019   LEFT THIGH  . SKIN BIOPSY Left 03/17/2019   Procedure: Skin Biopsy Left Thigh;  Surgeon: Helayne Seminole, MD;  Location: [place];  Service: ENT;  Laterality: Left;  . TONSILLECTOMY    . TOTAL KNEE ARTHROPLASTY  08/16/2011  Procedure: TOTAL KNEE ARTHROPLASTY;  Surgeon: Ninetta Lights, MD;  Location: [place];  Service: Orthopedics;  Laterality: Right;   Social History:  reports that she has never smoked. She has never used smokeless tobacco. She reports that she does not drink alcohol and does not use drugs.  Allergies  Allergen Reactions  . Citalopram Anxiety  . Codeine Other (See Comments)    Reaction not recalled  . Latex   . Thiazide-Type Diuretics Other (See Comments)    Blurry vision?? (patient stated she has macular degeneration)   Family History  Problem Relation Age of Onset  . Heart disease Father   . Cancer Maternal Aunt        stomach    Prior to Admission  medications   Medication Sig Start Date End Date Taking? Authorizing Provider  amLODipine (NORVASC) 2.5 MG tablet Take 2.5-5 mg by mouth See admin instructions. 5 mg in the morning, 2.5 mg in the evening 12/21/18  Yes [provider]  amoxicillin (AMOXIL) 500 MG capsule Take 4 capsules (2,000 mg total) by mouth once as needed for up to 1 dose. 60 mins prior to dental procedure 12/02/19  Yes Brunetta Genera, MD  Cholecalciferol (VITAMIN D) 50 MCG (2000 UT) tablet Take 2,000 Units by mouth 2 (two) times daily.   Yes [provider]  Cyanocobalamin (VITAMIN B-12 PO) Take 3,000 mcg by mouth daily.    Yes [provider]  levothyroxine (SYNTHROID) 25 MCG tablet Take 25 mcg by mouth daily before breakfast.  09/20/18  Yes [provider]  lidocaine-prilocaine (EMLA) cream Apply to affected area once 04/14/19  Yes Kale, Cloria Spring, MD  LORazepam (ATIVAN) 0.5 MG tablet TAKE 1/2 TO 1 (ONE-HALF TO ONE) TABLET BY MOUTH EVERY 8 HOURS 12/29/19  Yes Brunetta Genera, MD  Multiple Vitamins-Minerals (PRESERVISION AREDS PO) Take 1 capsule by mouth 2 (two) times daily.    Yes [provider]  ondansetron (ZOFRAN) 8 MG tablet Take 1 tablet (8 mg total) by mouth 2 (two) times daily as needed. Start on the third day after chemotherapy. 04/14/19  Yes Brunetta Genera, MD  polyethylene glycol (MIRALAX / GLYCOLAX) 17 g packet Take 17 g by mouth daily.   Yes [provider]  prochlorperazine (COMPAZINE) 10 MG tablet Take 1 tablet (10 mg total) by mouth every 6 (six) hours as needed (Nausea or vomiting). 04/14/19  Yes Brunetta Genera, MD  sodium chloride (MURO 128) 5 % ophthalmic ointment Place 1 application into both eyes at bedtime.   Yes [provider]  XIIDRA 5 % SOLN Place 1 drop into both eyes daily as needed (dry eyes).  10/22/19  Yes [provider]  zolpidem (AMBIEN) 5 MG tablet Take 5 mg by mouth at bedtime as needed for sleep.   12/07/19  Yes [provider]  apixaban (ELIQUIS) 5 MG TABS tablet Take 1 tablet (5 mg total) by mouth 2 (two) times daily. 08/18/19   Julian Hy, DO  benzonatate (TESSALON) 100 MG capsule Take 1 capsule (100 mg total) by mouth 3 (three) times daily as needed for cough. 12/29/19   Brunetta Genera, MD  ciprofloxacin (CIPRO) 500 MG tablet Take 1 tablet (500 mg total) by mouth 2 (two) times daily. Patient not taking: Reported on 12/30/2019 10/31/19   Volanda Napoleon, MD  citalopram (CELEXA) 10 MG tablet Take 1 tablet (10 mg total) by mouth daily. Patient not taking: Reported on 12/30/2019 12/02/19   Irene Limbo,  Cloria Spring, MD  gabapentin (NEURONTIN) 100 MG capsule Take 2 capsules (200 mg total) by mouth at bedtime. Patient not taking: Reported on 07/16/2019 05/14/19   Harle Stanford., PA-C   Physical Exam: Blood pressure (!) 161/89, pulse 87, temperature 99.4 F (37.4 C), resp. rate (!) 21, height 5\' 6"  (1.676 m), weight 51.7 kg, SpO2 90 %. Vitals:   12/31/19 0024 12/31/19 0100  BP: (!) 153/83 (!) 161/89  Pulse: 93 87  Resp: (!) 26 (!) 21  Temp:    SpO2: 94% 90%     General:  WNWD well groomed woman in no distress  Eyes:  C&S clear  ENT: no oral lesons  Neck: supple  Cardiovascular: RRR  Respiratory: dry cough, no rales, no wheezes  Abdomen: not examined  Skin: no lesions face, neck, arms  Musculoskeletal: MAE, no deformity  Psychiatric: A&O, normal mood  Neurologic: CN intact  Labs on Admission:  Basic Metabolic Panel: Recent Labs  Lab 12/31/19 0104  NA 138  K 3.7  CL 101  CO2 25  GLUCOSE 142*  BUN 22  CREATININE 0.59  CALCIUM 8.9   Liver Function Tests: Recent Labs  Lab 12/31/19 0104  AST 14*  ALT 12  ALKPHOS 52  BILITOT 1.0  PROT 6.5  ALBUMIN 3.7   No results for input(s): LIPASE, AMYLASE in the last 168 hours. No results for input(s): AMMONIA in the last 168 hours. CBC: Recent Labs  Lab 12/31/19 0104  WBC 9.3  NEUTROABS 7.5  HGB  12.3  HCT 37.9  MCV 90.7  PLT 182   Normal differential  Cardiac Enzymes: No results for input(s): CKTOTAL, CKMB, CKMBINDEX, TROPONINI in the last 168 hours. BNP: Invalid input(s): POCBNP CBG: No results for input(s): GLUCAP in the last 168 hours.  Radiological Exams on Admission: DG Chest 2 View  Result Date: 12/30/2019 CLINICAL DATA:  T-cell lymphoma, on chemotherapy. Cough. Question pneumonitis or pneumonia. EXAM: CHEST - 2 VIEW COMPARISON:  07/17/2019 FINDINGS: Mild pectus excavatum deformity. Midline trachea. Mild cardiomegaly. Atherosclerosis in the transverse aorta. No pleural effusion or pneumothorax. Right lower and possibly inferior right upper lobe patchy pulmonary opacities, most apparent on the frontal radiograph. The left lung is clear. Mild S shaped thoracolumbar spine curvature. IMPRESSION: Right-sided pulmonary opacities, suspicious for infection or inflammation. Recommend radiographic follow-up until clearing. Aortic Atherosclerosis (ICD10-I70.0). No thoracic adenopathy. Electronically Signed   By: Abigail Miyamoto M.D.   On: 12/30/2019 12:54    EKG: Independently reviewed. No EKG done  Discussion - patient with hazy opacities on CXR but no other criteria for bacterial pneumonia, I.e. productive sputum, fever, leukocytosis or left shift, significant hypoxemia. She has been fully vaccinated against Covid 19. Covid PCR negative. She is not feeling sick. She has received 2g cefepime in the ED. Reviewed findings with patient and her daughter. No indication for continue IV abx and no evidence of Covid infection requiring treatment. They agree with the plan to be discharged to home with the understandfing that they should follow up with Dr. Felipa Eth in 24-48 or return to ED for any progressive symptoms. Case discussed with Dr. Marisue Humble.  Time spent: 60 min  Adella Hare [facility] Pager [phone]  If 7PM-7AM, please contact night-coverage [email or web address] Password  [email or web address] 12/31/2019, 1:56 AM

## 2019-12-31 NOTE — ED Notes (Signed)
Blood culture x1 collected, unable to collect second set

## 2019-12-31 NOTE — ED Provider Notes (Signed)
Dr. Linda Hedges has evaluated the patient and does not believe she meets criteria for admission.  He will discharge her home with instructions to follow-up with her primary care physician, Dr. Felipa Eth, later this week or to return to the emergency department if her breathing or other symptoms worsen.  The patient is in agreement with this plan.     Gabriell Daigneault, Jenny Reichmann, MD 12/31/19 [phone]

## 2019-12-31 NOTE — Telephone Encounter (Signed)
I'm sorry to hear that. Right now if she is getting better with antibiotics, I think we are ok to follow up as needed. If she isn't improving on antibiotics we need to see her. I hope she feels better soon!  LPC

## 2020-01-01 DIAGNOSIS — I10 Essential (primary) hypertension: Secondary | ICD-10-CM | POA: Diagnosis not present

## 2020-01-01 DIAGNOSIS — R05 Cough: Secondary | ICD-10-CM | POA: Diagnosis not present

## 2020-01-01 NOTE — Telephone Encounter (Signed)
Left detailed message for [place], pts daughter, stating Dr. Ainsley Spinner recs and to call back to confirm the message was received.

## 2020-01-02 ENCOUNTER — Telehealth: Payer: Self-pay | Admitting: *Deleted

## 2020-01-02 NOTE — Telephone Encounter (Signed)
Daughter called - she said Ms. Sanderlin has persistant cough (up for last 2 nights). Per daughter, she knows mother has pneumonia - her concern is cough is exactly like cough mother had before lymphoma was diagnosed, she had "lymph nodes pressing on her trachea". Daughter is wondering if she needs some type of scan (no scans done in ED on Tuesday). Dr. Irene Limbo informed. Contacted daughter with Dr. Grier Mitts response: Pneumonia will also cause cough and is being treated at this time. Will continue to monitor.  Daughter verbalized understanding and states mother coughs much more at night lying down in bed than in the daytime when she is up. Suggested mother not lay flat in bed - could add extra pillow or elevate head of bed slightly with rolled blanket between mattress and foundation.  Encouraged her to contact MD on call if mother has fever or increase in cough over weekend and go to ED if any shortness of breath or chest pain. Daughter verbalized understanding of suggestions/recomendations.

## 2020-01-03 ENCOUNTER — Other Ambulatory Visit: Payer: Self-pay

## 2020-01-03 ENCOUNTER — Emergency Department (HOSPITAL_COMMUNITY)
Admission: EM | Admit: 2020-01-03 | Discharge: 2020-01-03 | Discharge status: Absent without leave | Payer: Medicare Other

## 2020-01-04 ENCOUNTER — Other Ambulatory Visit: Payer: Self-pay | Admitting: Critical Care Medicine

## 2020-01-05 ENCOUNTER — Telehealth: Payer: Self-pay | Admitting: *Deleted

## 2020-01-05 ENCOUNTER — Emergency Department (HOSPITAL_COMMUNITY)
Admission: EM | Admit: 2020-01-05 | Discharge: 2020-01-06 | Disposition: A | Discharge status: Home / Self Care | Payer: Medicare Other | Attending: Emergency Medicine | Admitting: Emergency Medicine

## 2020-01-05 ENCOUNTER — Encounter (HOSPITAL_COMMUNITY): Payer: Self-pay | Admitting: Emergency Medicine

## 2020-01-05 ENCOUNTER — Other Ambulatory Visit: Payer: Self-pay

## 2020-01-05 ENCOUNTER — Telehealth: Payer: Self-pay | Admitting: Critical Care Medicine

## 2020-01-05 DIAGNOSIS — C8440 Peripheral T-cell lymphoma, not classified, unspecified site: Secondary | ICD-10-CM | POA: Insufficient documentation

## 2020-01-05 DIAGNOSIS — Z5321 Procedure and treatment not carried out due to patient leaving prior to being seen by health care provider: Secondary | ICD-10-CM | POA: Insufficient documentation

## 2020-01-05 DIAGNOSIS — R05 Cough: Secondary | ICD-10-CM | POA: Diagnosis not present

## 2020-01-05 DIAGNOSIS — R509 Fever, unspecified: Secondary | ICD-10-CM | POA: Diagnosis not present

## 2020-01-05 LAB — CULTURE, BLOOD (ROUTINE X 2): Culture: NO GROWTH

## 2020-01-05 MED ORDER — APIXABAN 5 MG PO TABS: 5.0000 mg | ORAL_TABLET | Freq: Two times a day (BID) | ORAL | 1 refills | Status: DC

## 2020-01-05 MED ORDER — ONDANSETRON 4 MG PO TBDP
4.0000 mg | ORAL_TABLET | Freq: Once | ORAL | Status: DC | PRN
  Filled 2020-01-05: qty 1

## 2020-01-05 NOTE — Telephone Encounter (Signed)
Patient's daughter came to [facility] this am. Requested the following information be given to Dr. Irene Limbo. Patient continues to have constant cough, cannot sleep. Daughter states it sounds like when she was first diagnosed. Tessalon perls do not decrease cough. Patient is fatigued and poor appetite. Has "knot" at end of sternum (new). Producing less urine than usual - per daughter she has increased her mother's fluid intake since she is staying with her, but urinating less. Patient saw PCP last week - per daughter, MD said "lungs sounded clear". Labs on 8/11. Had temp up to 102 over w/e - gave tylenol, did not reduce in 1 hour, called MD on call - was recommended to go to ED - temp normal once arrived at ED, with 5+ hour wait, they left. Dr.Kale informed. Per Dr. Irene Limbo, contacted daughter and advised to go to ED for evaluation as many complex issues. Daughter verbalized understanding.

## 2020-01-05 NOTE — ED Triage Notes (Signed)
Family states having fevers since Friday. Having cough which xray showed PNA. Cancer provider advised to go to ED for admission.

## 2020-01-05 NOTE — Telephone Encounter (Signed)
Spoke with pt's daughter and advised that refill was sent to [facility]. She verbalized understanding. Nothing further needed.

## 2020-01-06 ENCOUNTER — Ambulatory Visit (HOSPITAL_COMMUNITY)
Admission: RE | Admit: 2020-01-06 | Discharge: 2020-01-06 | Disposition: A | Discharge status: Home / Self Care | Payer: Medicare Other | Source: Ambulatory Visit | Attending: Medical | Admitting: Medical

## 2020-01-06 ENCOUNTER — Inpatient Hospital Stay (HOSPITAL_BASED_OUTPATIENT_CLINIC_OR_DEPARTMENT_OTHER): Payer: Medicare Other | Admitting: Medical

## 2020-01-06 ENCOUNTER — Telehealth: Payer: Self-pay | Admitting: *Deleted

## 2020-01-06 ENCOUNTER — Other Ambulatory Visit: Payer: Self-pay | Admitting: *Deleted

## 2020-01-06 ENCOUNTER — Other Ambulatory Visit: Payer: Self-pay

## 2020-01-06 VITALS — BP 131/81 | HR 84 | Temp 98.7°F | Resp 18 | Ht 66.0 in | Wt 110.9 lb

## 2020-01-06 DIAGNOSIS — I2699 Other pulmonary embolism without acute cor pulmonale: Secondary | ICD-10-CM | POA: Diagnosis not present

## 2020-01-06 DIAGNOSIS — Z95828 Presence of other vascular implants and grafts: Secondary | ICD-10-CM

## 2020-01-06 DIAGNOSIS — R059 Cough, unspecified: Secondary | ICD-10-CM

## 2020-01-06 DIAGNOSIS — C865 Angioimmunoblastic T-cell lymphoma: Secondary | ICD-10-CM | POA: Diagnosis not present

## 2020-01-06 DIAGNOSIS — R509 Fever, unspecified: Secondary | ICD-10-CM | POA: Diagnosis not present

## 2020-01-06 DIAGNOSIS — Z5112 Encounter for antineoplastic immunotherapy: Secondary | ICD-10-CM | POA: Diagnosis not present

## 2020-01-06 DIAGNOSIS — J189 Pneumonia, unspecified organism: Secondary | ICD-10-CM | POA: Diagnosis not present

## 2020-01-06 DIAGNOSIS — C8440 Peripheral T-cell lymphoma, not classified, unspecified site: Secondary | ICD-10-CM | POA: Insufficient documentation

## 2020-01-06 DIAGNOSIS — I517 Cardiomegaly: Secondary | ICD-10-CM | POA: Diagnosis not present

## 2020-01-06 DIAGNOSIS — R05 Cough: Secondary | ICD-10-CM

## 2020-01-06 DIAGNOSIS — C8590 Non-Hodgkin lymphoma, unspecified, unspecified site: Secondary | ICD-10-CM | POA: Diagnosis not present

## 2020-01-06 DIAGNOSIS — Z853 Personal history of malignant neoplasm of breast: Secondary | ICD-10-CM | POA: Diagnosis not present

## 2020-01-06 LAB — CBC WITH DIFFERENTIAL (CANCER CENTER ONLY)
Abs Immature Granulocytes: 0.08 10*3/uL — ABNORMAL HIGH (ref 0.00–0.07)
Basophils Absolute: 0.1 10*3/uL (ref 0.0–0.1)
Basophils Relative: 1 %
Eosinophils Absolute: 0.1 10*3/uL (ref 0.0–0.5)
Eosinophils Relative: 1 %
HCT: 35.9 % — ABNORMAL LOW (ref 36.0–46.0)
Hemoglobin: 11.8 g/dL — ABNORMAL LOW (ref 12.0–15.0)
Immature Granulocytes: 1 %
Lymphocytes Relative: 7 %
Lymphs Abs: 0.7 10*3/uL (ref 0.7–4.0)
MCH: 29.1 pg (ref 26.0–34.0)
MCHC: 32.9 g/dL (ref 30.0–36.0)
MCV: 88.6 fL (ref 80.0–100.0)
Monocytes Absolute: 1 10*3/uL (ref 0.1–1.0)
Monocytes Relative: 9 %
Neutro Abs: 8.6 10*3/uL — ABNORMAL HIGH (ref 1.7–7.7)
Neutrophils Relative %: 81 %
Platelet Count: 315 10*3/uL (ref 150–400)
RBC: 4.05 MIL/uL (ref 3.87–5.11)
RDW: 14.7 % (ref 11.5–15.5)
WBC Count: 10.5 10*3/uL (ref 4.0–10.5)
nRBC: 0 % (ref 0.0–0.2)

## 2020-01-06 MED ORDER — SODIUM CHLORIDE 0.9 % IV SOLN
INTRAVENOUS | Status: DC
  Filled 2020-01-06: qty 250

## 2020-01-06 MED ORDER — SODIUM CHLORIDE 0.9% FLUSH
10.0000 mL | Freq: Once | INTRAVENOUS | Status: AC
  Administered 2020-01-06: 10 mL
  Filled 2020-01-06: qty 10

## 2020-01-06 MED ORDER — MOXIFLOXACIN HCL 400 MG PO TABS: 400.0000 mg | ORAL_TABLET | Freq: Every day | ORAL | 0 refills | Status: AC

## 2020-01-06 MED ORDER — SODIUM CHLORIDE 0.9% FLUSH
10.0000 mL | INTRAVENOUS | Status: DC | PRN
  Administered 2020-01-06: 10 mL via INTRAVENOUS
  Filled 2020-01-06: qty 10

## 2020-01-06 MED ORDER — DEXTROSE 5 % IV SOLN
2.0000 g | Freq: Once | INTRAVENOUS | Status: AC
  Administered 2020-01-06: 2 g via INTRAVENOUS
  Filled 2020-01-06: qty 20

## 2020-01-06 MED ORDER — PIPERACILLIN-TAZOBACTAM 3.375 G IVPB 30 MIN
3.3750 g | Freq: Once | INTRAVENOUS | Status: DC
  Filled 2020-01-06: qty 50

## 2020-01-06 MED ORDER — DEXTROSE 5 % IV SOLN
2.0000 g | Freq: Once | INTRAVENOUS | Status: DC
  Filled 2020-01-06: qty 20

## 2020-01-06 MED ORDER — HEPARIN SOD (PORK) LOCK FLUSH 100 UNIT/ML IV SOLN
500.0000 [IU] | Freq: Once | INTRAVENOUS | Status: AC
  Administered 2020-01-06: 500 [IU]
  Filled 2020-01-06: qty 5

## 2020-01-06 NOTE — Patient Instructions (Signed)
Ceftriaxone Injection What is this medicine? CEFTRIAXONE (sef try AX one) is a cephalosporin antibiotic. It treats some infections caused by bacteria. It will not work for colds, the flu, or other viruses. This medicine may be used for other purposes; ask your health care provider or pharmacist if you have questions. COMMON BRAND NAME(S): Ceftrisol Plus, Rocephin What should I tell my health care provider before I take this medicine? They need to know if you have any of these conditions:  any chronic illness  bowel disease, like colitis  both kidney and liver disease  high bilirubin level in newborn patients  an unusual or allergic reaction to ceftriaxone, other cephalosporin or penicillin antibiotics, foods, dyes, or preservatives  pregnant or trying to get pregnant  breast-feeding How should I use this medicine? This drug is injected into a muscle or a vein. It is usually given by a health care provider in a hospital or clinic setting. If you get this drug at home, you will be taught how to prepare and give it. Use exactly as directed. Take it as directed on the prescription label at the same time every day. Keep taking it unless your health care provider tells you to stop. It is important that you put your used needles and syringes in a special sharps container. Do not put them in a trash can. If you do not have a sharps container, call your pharmacist or health care provider to get one. Talk to your health care provider about the use of this drug in children. While it may be prescribed for children as young as newborns for selected conditions, precautions do apply. Overdosage: If you think you have taken too much of this medicine contact a poison control center or emergency room at once. NOTE: This medicine is only for you. Do not share this medicine with others. What if I miss a dose? It is important not to miss your dose. Call your health care provider if you are unable to keep an  appointment. If you give yourself this drug at home and you miss a dose, take it as soon as you can. If it is almost time for your next dose, take only that dose. Do not take double or extra doses. What may interact with this medicine? Do not take this medicine with any of the following medications:  intravenous calcium This medicine may also interact with the following medications:  birth control pills This list may not describe all possible interactions. Give your health care provider a list of all the medicines, herbs, non-prescription drugs, or dietary supplements you use. Also tell them if you smoke, drink alcohol, or use illegal drugs. Some items may interact with your medicine. What should I watch for while using this medicine? Tell your doctor or health care provider if your symptoms do not improve or if they get worse. This medicine may cause serious skin reactions. They can happen weeks to months after starting the medicine. Contact your health care provider right away if you notice fevers or flu-like symptoms with a rash. The rash may be red or purple and then turn into blisters or peeling of the skin. Or, you might notice a red rash with swelling of the face, lips or lymph nodes in your neck or under your arms. Do not treat diarrhea with over the counter products. Contact your doctor if you have diarrhea that lasts more than 2 days or if it is severe and watery. If you are being treated for  a sexually transmitted disease, avoid sexual contact until you have finished your treatment. Having sex can infect your sexual partner. Calcium may bind to this medicine and cause lung or kidney problems. Avoid calcium products while taking this medicine and for 48 hours after taking the last dose of this medicine. What side effects may I notice from receiving this medicine? Side effects that you should report to your doctor or health care professional as soon as possible:  allergic reactions like  skin rash, itching or hives, swelling of the face, lips, or tongue  breathing problems  fever, chills  irregular heartbeat  pain when passing urine  redness, blistering, peeling, or loosening of the skin, including inside the mouth  seizures  stomach pain, cramps  unusual bleeding, bruising  unusually weak or tired Side effects that usually do not require medical attention (report to your doctor or health care professional if they continue or are bothersome):  diarrhea  dizzy, drowsy  headache  nausea, vomiting  pain, swelling, irritation where injected  stomach upset  sweating This list may not describe all possible side effects. Call your doctor for medical advice about side effects. You may report side effects to FDA at 1-800-FDA-1088. Where should I keep my medicine? Keep out of the reach of children and pets. You will be instructed on how to store this drug. Protect from light. Throw away any unused drug after the expiration date. NOTE: This sheet is a summary. It may not cover all possible information. If you have questions about this medicine, talk to your doctor, pharmacist, or health care provider.  2020 Elsevier/Gold Standard (2018-12-12 18:29:21)

## 2020-01-06 NOTE — Telephone Encounter (Signed)
Received call from daughter. Patient went to [facility] ED yesterday - arrived at 230 in afternoon. No evaluation or testing done. Had not seen an MD by 130am. Was not offered food since she had not seen MD. Patient became increasingly agitated after 11 hours in ED. Daughter states she took mother home at 100 am. Daughter asked if the tests Dr.Kale was thinking about could be done as outpatient as they have gone to ED twice in last 7 days and neither was good experience. Dr. Irene Limbo informed.  Dr.Kale offered for patient to be seen in [facility] today and patient's daughter accepted. Mr. Satira Sark gave verbal order for CBC/Diff and CXR, daughter informed - will get CXR first and then come to [place].

## 2020-01-06 NOTE — ED Notes (Signed)
Pt support person said they were tired of waiting and did not want to be charged for this visit. Eloped from triage room.

## 2020-01-07 NOTE — Progress Notes (Signed)
These results were reviewed with Dr. Irene Limbo and with the patient.

## 2020-01-07 NOTE — Progress Notes (Signed)
These results were reviewed with the patient.

## 2020-01-08 ENCOUNTER — Encounter: Payer: Self-pay | Admitting: Oncology

## 2020-01-08 NOTE — Progress Notes (Signed)
[facility] Progress Note   Sarah [name] [id number] 1932-11-26 86 y.o.  Sarah [name] is managed by Dr. Sullivan Lone  Actively treated with chemotherapy/immunotherapy/hormonal therapy: yes  Current therapy: C5 Brentuximab-Vedotin   Next scheduled appointment with provider: 01/13/2020  Assessment: Plan:    Pneumonia of right lower lobe due to infectious organism - Plan: moxifloxacin (AVELOX) 400 MG tablet, cefTRIAXone (ROCEPHIN) 2 g in dextrose 5 % 50 mL IVPB, 0.9 %  sodium chloride infusion, DISCONTINUED: cefTRIAXone (ROCEPHIN) 2 g in dextrose 5 % 50 mL IVPB, DISCONTINUED: piperacillin-tazobactam (ZOSYN) IVPB 3.375 g  Port-A-Cath in place - Plan: sodium chloride flush (NS) 0.9 % injection 10 mL, heparin lock flush 100 unit/mL, sodium chloride flush (NS) 0.9 % injection 10 mL  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  FINDINGS: Unchanged right chest wall port catheter.  Unchanged borderline cardiomegaly.  Increasing patchy opacities in the right lower lobe. No pleural effusion or pneumothorax.  No acute osseous abnormality.  IMPRESSION: 1. Worsening right lower lobe pneumonia.  Zosyn was initially ordered. There was none available so the patient was given Rocephin 2 grams IV x 1 and placed on moxifloxacin 400 mg po QDay x 10 days.   Angioimmunoblastic lymphoma: Sarah [name] continues to be followed by Dr. Irene Limbo and is treated with C5 Brentuximab-Vedotin. She will be seen next on 01/13/2020.   Please see After Visit Summary for patient specific instructions.  Future Appointments  Date Time Provider [place]  01/13/2020  8:30 AM [facility] None  01/13/2020  8:45 AM [facility] [place] FLUSH  [facility] None  01/13/2020  9:20 AM Brunetta Genera, MD [facility] None  01/13/2020 10:30 AM [facility] INFUSION [facility] None  03/02/2020 11:00 AM Marzetta Board, DPM [facility]  10/05/2020  9:15 AM Hayden Pedro, MD [facility] None    No orders of the defined types were placed in this encounter.      Subjective:   Patient ID:  Sarah [name] is a 86 y.o. (DOB 1933-01-15) female.  Chief Complaint: No chief complaint on file.   HPI Sarah [name]  Is a is a 86 y.o. female with a diagnosis of an angioimmunoblastic lymphoma. She is followed by Dr. Irene Limbo and is treated with C5 Brentuximab-Vedotin.  She was had a chest x-ray on 12/30/2019 which showed:  FINDINGS: Mild pectus excavatum deformity.  Midline trachea.  Mild cardiomegaly. Atherosclerosis in the transverse aorta.  No pleural effusion or pneumothorax.  Right lower and possibly inferior right upper lobe patchy pulmonary opacities, most apparent on the frontal radiograph. The left lung is clear.  Mild S shaped thoracolumbar spine curvature.  IMPRESSION: Right-sided pulmonary opacities, suspicious for infection or inflammation.  Recommend radiographic follow-up until clearing.  She was referred to the ER for IV antibiotics and admission. She was given Vancomycin IV x 1.  Dr. Adella Hare (hospitalist) was consulted and determined that Sarah [name] did not believe that she met the criteria for admission. She was discharged home on no antibiotics. She noted a fever of 102 last Friday. She and her daughter presented to the ER twice last weekend but left without being seen due to the extended wait time. She presents today for evaluation after having a chest x-ray completed  which showing:  COMPARISON:  Chest x-ray dated December 30, 2019.  FINDINGS: Unchanged right chest wall port catheter.  Unchanged borderline cardiomegaly.  Increasing patchy opacities in the right lower lobe. No pleural effusion  or pneumothorax.  No acute osseous abnormality.  IMPRESSION: 1. Worsening right lower lobe pneumonia.   Medications: I have reviewed the patient's current medications.  Allergies:  Allergies  Allergen Reactions  . Citalopram Anxiety  . Codeine Other (See Comments)    Reaction not recalled  . Latex   . Thiazide-Type Diuretics Other (See Comments)    Blurry vision?? (patient stated she has macular degeneration)    Past Medical History:  Diagnosis Date  . Allergy    codeine, thiazides  . Anxiety    new dx  . Arthritis   . Breast cancer (HCC) 03/14/12   bx=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,ER/PR=+,upper inner quad  . Cancer (HCC)    breast  . Cataract   . DVT (deep venous thrombosis) (HCC) 02/2019   left leg  . Dyspnea   . Edema    both legs feet and toe, abdomen  . Glaucoma    laser treated years ago  . HOH (hard of hearing)   . Hyperlipemia   . Hypertension   . Hypothyroidism   . Pancreatic cyst    benign  . PONV (postoperative nausea and vomiting)   . Pulmonary embolism (HCC) 02/2019   bilateral   . Radiation 06/11/2012-07/12/2012   17 sessions 4250 cGy, 3 sessions 750 cGy  . Vertigo   . Wears glasses   . Wears partial dentures    partial upper    Past Surgical History:  Procedure Laterality Date  . ABDOMINAL HYSTERECTOMY  1966   1/2 ovary left in   . BREAST SURGERY  1992   lumpectomy-lt  . CATARACT EXTRACTION     b/l  . COLONOSCOPY    . EXCISION MASS NECK Left 03/17/2019    EXCISION MASS NECK (Left Neck)  . EXCISION MASS NECK Left 03/17/2019   Procedure: EXCISION MASS NECK;  Surgeon: Graylin Shiver, MD;  Location: [facility] OR;  Service: ENT;  Laterality: Left;  . EYE SURGERY Bilateral    bilateral cataract removal  . IR IMAGING GUIDED PORT INSERTION  04/23/2019  . JOINT REPLACEMENT  2013   rt total knee  . JOINT REPLACEMENT  1995   lt total knee  . PARTIAL MASTECTOMY WITH NEEDLE LOCALIZATION  04/09/2012   Procedure:  PARTIAL MASTECTOMY WITH NEEDLE LOCALIZATION;  Surgeon: Ernestene Mention, MD;  Location: [facility];  Service: General;  Laterality: Right;  . SKIN BIOPSY Left 03/17/2019   LEFT THIGH  . SKIN BIOPSY Left 03/17/2019   Procedure: Skin Biopsy Left Thigh;  Surgeon: Graylin Shiver, MD;  Location: [facility] OR;  Service: ENT;  Laterality: Left;  . TONSILLECTOMY    . TOTAL KNEE ARTHROPLASTY  08/16/2011   Procedure: TOTAL KNEE ARTHROPLASTY;  Surgeon: Loreta Ave, MD;  Location: [facility] OR;  Service: Orthopedics;  Laterality: Right;    Family History  Problem Relation Age of Onset  . Heart disease Father   . Cancer Maternal Aunt        stomach    Social History   Socioeconomic History  . Marital status: Widowed    Spouse name: Not on file  . Number of children: 2  . Years of education: Not on file  . Highest education level: Not on file  Occupational History  . Occupation:  Retired    Comment: Community education officer   Tobacco Use  . Smoking status: Never Smoker  . Smokeless tobacco: Never Used  Vaping Use  . Vaping Use: Never used  Substance and Sexual Activity  . Alcohol use: No  . Drug use: No  . Sexual activity: Not Currently  Other Topics Concern  . Not on file  Social History Narrative  . Not on file   Social Determinants of Health   Financial Resource Strain:   . Difficulty of Paying Living Expenses: Not on file  Food Insecurity:   . Worried About Charity fundraiser in the Last Year: Not on file  . Ran Out of Food in the Last Year: Not on file  Transportation Needs:   . Lack of Transportation (Medical): Not on file  . Lack of Transportation (Non-Medical): Not on file  Physical Activity:   . Days of Exercise per Week: Not on file  . Minutes of Exercise per Session: Not on file  Stress:   . Feeling of Stress : Not on file  Social Connections:   . Frequency of Communication with Friends and Family: Not on file  . Frequency of Social Gatherings with Friends and  Family: Not on file  . Attends Religious Services: Not on file  . Active Member of Clubs or Organizations: Not on file  . Attends Archivist Meetings: Not on file  . Marital Status: Not on file  Intimate Partner Violence:   . Fear of Current or Ex-Partner: Not on file  . Emotionally Abused: Not on file  . Physically Abused: Not on file  . Sexually Abused: Not on file    Past Medical History, Surgical history, Social history, and Family history were reviewed and updated as appropriate.   Please see review of systems for further details on the patient's review from today.   Review of Systems:  Review of Systems  Constitutional: Negative for chills, diaphoresis, fatigue and fever.  HENT: Negative for congestion, postnasal drip, rhinorrhea and sore throat.   Respiratory: Positive for cough. Negative for shortness of breath and wheezing.   Cardiovascular: Negative for palpitations.  Neurological: Negative for headaches.    Objective:   Physical Exam:  BP 131/81   Pulse 84   Temp 98.7 F (37.1 C) (Oral)   Resp 18   Ht $R'5\' 6"'zS$  (1.676 m)   Wt 110 lb 14.4 oz (50.3 kg)   SpO2 100%   BMI 17.90 kg/m  ECOG: 1  Physical Exam Constitutional:      General: She is not in acute distress.    Appearance: She is not diaphoretic.  HENT:     Head: Normocephalic and atraumatic.  Eyes:     General: No scleral icterus.       Right eye: No discharge.        Left eye: No discharge.     Conjunctiva/sclera: Conjunctivae normal.  Cardiovascular:     Rate and Rhythm: Normal rate and regular rhythm.     Heart sounds: Normal heart sounds. No murmur heard.  No friction rub. No gallop.   Pulmonary:     Effort: Pulmonary effort is normal. No respiratory distress.     Breath sounds: Normal breath sounds. No wheezing or rales.  Skin:    General: Skin is warm and dry.     Findings: No erythema or rash.  Neurological:     Mental Status: She is alert.     Coordination: Coordination  normal.  Gait: Gait normal.  Psychiatric:        Mood and Affect: Mood normal.        Behavior: Behavior normal.        Thought Content: Thought content normal.        Judgment: Judgment normal.     Lab Review:     Component Value Date/Time   NA 138 12/31/2019 0104   NA 141 07/25/2016 1053   K 3.7 12/31/2019 0104   K 3.6 07/25/2016 1053   CL 101 12/31/2019 0104   CL 106 04/25/2012 1606   CO2 25 12/31/2019 0104   CO2 27 07/25/2016 1053   GLUCOSE 142 (H) 12/31/2019 0104   GLUCOSE 102 07/25/2016 1053   GLUCOSE 114 (H) 04/25/2012 1606   BUN 22 12/31/2019 0104   BUN 15.5 07/25/2016 1053   CREATININE 0.59 12/31/2019 0104   CREATININE 0.71 12/23/2019 1021   CREATININE 0.8 07/25/2016 1053   CALCIUM 8.9 12/31/2019 0104   CALCIUM 10.1 07/25/2016 1053   PROT 6.5 12/31/2019 0104   PROT 7.5 07/25/2016 1053   ALBUMIN 3.7 12/31/2019 0104   ALBUMIN 4.3 07/25/2016 1053   AST 14 (L) 12/31/2019 0104   AST 17 12/23/2019 1021   AST 17 07/25/2016 1053   ALT 12 12/31/2019 0104   ALT 11 12/23/2019 1021   ALT 11 07/25/2016 1053   ALKPHOS 52 12/31/2019 0104   ALKPHOS 99 07/25/2016 1053   BILITOT 1.0 12/31/2019 0104   BILITOT 0.6 12/23/2019 1021   BILITOT 0.61 07/25/2016 1053   GFRNONAA >60 12/31/2019 0104   GFRNONAA >60 12/23/2019 1021   GFRAA >60 12/31/2019 0104   GFRAA >60 12/23/2019 1021       Component Value Date/Time   WBC 10.5 01/06/2020 1355   WBC 9.3 12/31/2019 0104   RBC 4.05 01/06/2020 1355   HGB 11.8 (L) 01/06/2020 1355   HGB 15.2 07/25/2016 1053   HCT 35.9 (L) 01/06/2020 1355   HCT 44.9 07/25/2016 1053   PLT 315 01/06/2020 1355   PLT 176 07/25/2016 1053   MCV 88.6 01/06/2020 1355   MCV 88.5 07/25/2016 1053   MCH 29.1 01/06/2020 1355   MCHC 32.9 01/06/2020 1355   RDW 14.7 01/06/2020 1355   RDW 13.7 07/25/2016 1053   LYMPHSABS 0.7 01/06/2020 1355   LYMPHSABS 0.9 07/25/2016 1053   MONOABS 1.0 01/06/2020 1355   MONOABS 0.4 07/25/2016 1053   EOSABS 0.1  01/06/2020 1355   EOSABS 0.1 07/25/2016 1053   BASOSABS 0.1 01/06/2020 1355   BASOSABS 0.1 07/25/2016 1053   -------------------------------  Imaging from last 24 hours (if applicable):  Radiology interpretation: DG Chest 2 View  Result Date: 01/06/2020 CLINICAL DATA:  Cough.  History of lymphoma. EXAM: CHEST - 2 VIEW COMPARISON:  Chest x-ray dated December 30, 2019. FINDINGS: Unchanged right chest wall port catheter. Unchanged borderline cardiomegaly. Increasing patchy opacities in the right lower lobe. No pleural effusion or pneumothorax. No acute osseous abnormality. IMPRESSION: 1. Worsening right lower lobe pneumonia. Electronically Signed   By: Titus Dubin M.D.   On: 01/06/2020 13:50   DG Chest 2 View  Result Date: 12/30/2019 CLINICAL DATA:  T-cell lymphoma, on chemotherapy. Cough. Question pneumonitis or pneumonia. EXAM: CHEST - 2 VIEW COMPARISON:  07/17/2019 FINDINGS: Mild pectus excavatum deformity. Midline trachea. Mild cardiomegaly. Atherosclerosis in the transverse aorta. No pleural effusion or pneumothorax. Right lower and possibly inferior right upper lobe patchy pulmonary opacities, most apparent on the frontal radiograph. The left lung is clear. Mild  S shaped thoracolumbar spine curvature. IMPRESSION: Right-sided pulmonary opacities, suspicious for infection or inflammation. Recommend radiographic follow-up until clearing. Aortic Atherosclerosis (ICD10-I70.0). No thoracic adenopathy. Electronically Signed   By: Abigail Miyamoto M.D.   On: 12/30/2019 12:54

## 2020-01-12 ENCOUNTER — Other Ambulatory Visit: Payer: Self-pay | Admitting: Hematology

## 2020-01-12 DIAGNOSIS — C8440 Peripheral T-cell lymphoma, not classified, unspecified site: Secondary | ICD-10-CM

## 2020-01-12 NOTE — Progress Notes (Unsigned)
[facility]

## 2020-01-12 NOTE — Progress Notes (Signed)
HEMATOLOGY/ONCOLOGY CLINIC NOTE  Date of Service: 01/13/2020  Patient Care Team: Lajean Manes, MD as PCP - General (Internal Medicine) Buford Dresser, MD as PCP - Cardiology (Cardiology)  REFERRING PHYSICIAN: Lajean Manes, MD  CHIEF COMPLAINTS/PURPOSE OF CONSULTATION:  Continue mx of Recently Diagnosed Angioimmunoblastic T cell lymphoma      HISTORY OF PRESENTING ILLNESS:   Sarah [name] is a wonderful 86 y.o. female who has been referred to [place] by Lajean Manes, MD for evaluation and management of Possible lymphoma . She is accompanied today by [name] . The pt reports that she is doing well overall.   Pending Surgical pathology from 03/17/2019 Dr.Manny will contact clinic about pathology   August 25th/27th she began coughing and sneezing. On Sept 3 went to doctors and they said it was allergies. She began having rashes (little red dots). Went to PCP and they prescribed prednisone and for several days and the medication helped. But as soon as she completed the prescription symptoms reoccurred.   She had a chest X Ray done because she was having a severe cough on 02/17/2019 revealing "bilateral effusions in this patient with history of breast cancer,of uncertain significance with question of right lower lobe pulmonary nodule, consider chest CT for further assessment. Atherosclerotic changes in the thoracic aorta."  Dr. Amy Martinique biopsied the rashes on her back 02/21/2019 and it was determined that it could be because of an allergy to medication. Dr.Stoneking stopped her blood pressure medication from September- October but ther rash still present. The itching started 1 to 2 months ago. The rash would come and go and was mostly on her back. She still has rash on her left thigh.  She is using triaconazole and cetrizine.  She has lumps behind ear and abdomin and she states that they have swollen twice the size within 1 and half months  She has no appetite and  has changes in her taste that began around August. She has changes in bowels. They are more loose and smaller In size. She also has no control over bowels.  She feels fatigued and it has increased since August.   She has a cough that starts in September  that has made her hoarse.   She had a CT scan on 02/27/2019 because she was having severe cough and her stomach was very extended. Revealing "1. Extensive lymphadenopathy throughout the chest, abdomen, and pelvis, with index lymph nodes identified above. Splenomegaly. Findings are most consistent with lymphoma with recurrent, metastatic breast malignancy less favored. 2. There is generally osteopenic appearance of the skeleton. There are numerous subtle hypodense lesions, particularly of the pelvis (e.g. Series 2, image 88) and select vertebral bodies, for example L4 (series 8, image 92). This is suspicious although not definitive for osseous metastatic disease. Characterization for metabolic activity by nuclear scintigraphic bone scan or PET-CT would be helpful to further evaluate. 3. Moderate right, small left pleural effusions with associated atelectasis or consolidation. Subpleural radiation fibrosis of the anterior right lung. 4. There is a new subpleural pulmonary nodule of the anterior right middle lobe measuring 6 mm (series 4, image 105), nonspecific. There is no obvious pleural thickening or nodularity definitive for metastatic disease. 5.  Small volume ascites. 6. There is a fluid attenuation lesion of the pancreatic uncinate measuring 2.4 x 1.3 cm (series 2, image 63), not substantially changed when compared to remote prior MR examination dated 02/23/2012 and likely sequelae of a prior pseudocyst versus incidental pancreatic IPMN. 7.  Cardiomegaly."  Hospitalized from 03/06/2019-03/08/2019. She presenting with acute shortness of breath, leg swelling,left hand swelling and some weight gain despite poor appetite. In ED, CTA chest revealed at least  submassive PE, moderate right pleural effusion, diffuse LAD concerning for lymphoma. Started on heparin drip. PCCM consulted for guidance. She had thoracocentesis with removal of 1200 cc cloudy exudative pleural fluid. Cardiology consulted for elevated which was thought to be demand ischemia from right heart strain in the setting of PE. Patient was transitioned to subcu Lovenox by pulmonology the next day and remained stable. Unusual appearance of tracheal air column at the thoracic inlet. Correlate with any history of swallowing difficulty or changes in voice with dedicated imaging with chest CT and or CT of the neck as indicated.  She has a nonhealing ulcer on her right tibialis anterior that starting weeping one week ago. Of note since the patient's last visit, pt has had CHEST XRAY - 2 VIEW (Accession [id number]) completed on 02/17/2019 with results revealing "Bilateral effusions in this patient with history of breast cancer,of uncertain significance with question of right lower lobepulmonary nodule, consider chest CT for furtherassessment.Unusual appearance of tracheal air column at the thoracic inlet.Correlate with any history of swallowing difficulty or changes in voice with dedicated imaging with chest CT and or CT of the neck as Indicated. Atherosclerotic changes in the thoracic aorta."  Of note since the patient's last visit, pt has had CT CHEST, ABDOMEN, AND PELVIS WITH CONTRAST (Accession [id number]) completed on 02/27/2019 with results revealing "1. Extensive lymphadenopathy throughout the chest, abdomen, and pelvis, with index lymph nodes identified above. Splenomegaly.Findings are most consistent with lymphoma with recurrent, metastatic breast malignancy less favored. 2. There is generally osteopenic appearance of the skeleton. There are numerous subtle hypodense lesions, particularly of the pelvis (e.g. Series 2, image 88) and select vertebral bodies, for example L4 (series 8, image 92).  This is suspicious although not definitive for osseous metastatic disease. Characterization for metabolic activity by nuclear scintigraphic bone scan or PET-CT would be helpful to further evaluate.  3. Moderate right, small left pleural effusions with associated atelectasis or consolidation. Subpleural radiation fibrosis of the anterior right lung.4. There is a new subpleural pulmonary nodule of the anterior right middle lobe measuring 6 mm (series 4, image 105), nonspecific. There is no obvious pleural thickening or nodularity definitive for metastatic disease.5.  Small volume ascites. 6. There is a fluid attenuation lesion of the pancreatic uncinate measuring 2.4 x 1.3 cm (series 2, image 63), not substantially changed when compared to remote prior MR examination dated 02/23/2012 and likely sequelae of a prior pseudocyst versus incidental pancreatic IPMN.7.  Cardiomegaly.8. Other chronic and incidental findings as detailed above. Aortic Atherosclerosis (ICD10-I70.0)."  Of note since the patient's last visit, pt has had PORTABLE CHEST 1 VIEW (Accession [id number]) completed on 03/06/2019 with results revealing "1. Moderate size right and small left pleural effusions appear not significantly changed since 02/27/19. 2. No new cardiopulmonary abnormality."  Of note since the patient's last visit, pt has had ECHOCARDIOGRAM  completed on 03/06/2019 with results revealing  "1. Left ventricular ejection fraction, by visual estimation, is 60 to 65%. The left ventricle has normal function. Normal left ventricular size. There is no left ventricular hypertrophy. 2. Left ventricular diastolic Doppler parameters are consistent with impaired relaxation pattern of LV diastolic filling. 3. Global right ventricle has mildly reduced systolic function.The right ventricular size is moderately enlarged. No increase in right ventricular wall thickness. 4. Left atrial size was normal.  5. Right atrial size was mildly dilated.  6. Trivial pericardial effusion is present. 7. The mitral valve is normal in structure. Trace mitral valve regurgitation. No evidence of mitral stenosis. 8. The tricuspid valve is normal in structure. Tricuspid valve regurgitation severe. 9. The aortic valve is normal in structure. Aortic valve regurgitation was not visualized by color flow Doppler. Structurally normal aortic valve, with no evidence of sclerosis or stenosis.10. The pulmonic valve was normal in structure. Pulmonic valve regurgitation is mild by color flow Doppler.11. Mildly elevated pulmonary artery systolic pressure.12. The inferior vena cava is normal in size with greater than 50% respiratory variability, suggesting right atrial pressure of 3 mmHg."  Of note since the patient's last visit, pt has had CT ANGIOGRAPHY CHEST WITH CONTRAST (Accession [id number]) completed on 03/06/2019 with results revealing "1. Pulmonary embolus arising from the distal left main pulmonary artery with extension into multiple left lower lobe pulmonary arterial branches. There is also incompletely obstructing pulmonary embolus in the proximal left upper lobe pulmonary artery. More distal pulmonary embolus in the right lower lobe pulmonary artery. Positive for acute PE with CT evidence of right heart strain (RV/LV Ratio = 1.6) consistent with at least submassive (intermediate risk) PE. The presence of right heart strain has been associated with an increased risk of morbidity and mortality. Please activate Code PE by paging [phone]. 2. Sizable pleural effusion on the right with smaller pleural effusion on the left. Consolidation and compressive atelectasis throughout most of the right lower lobe. Milder atelectasis left base. Nodular opacities on the right, likely metastatic foci.3.  Stable adenopathy compared to 1 week prior.4. Enlargement of spleen, incompletely visualized but documented CT 1 week prior. Concern for potential lymphoma. 5. Aortic atherosclerosis.  No aneurysm or dissection evident. Foci of coronary artery calcification noted."  Most recent lab results (03/06/2019) of CBC is as follows: all values are WNL except for Platelets at 72, Eosinophils Absolute at 0.6,Abs Immature Granulocytes at 0.08 .  On review of systems, pt reports rashes, digestive issues and denies back pain, abdominal pain and any other symptoms.   On PMHx the pt reports Hypothyroidism, MCI, Hypercholesterolemia, Tracheal anomaly, Hypertension, Pulmonary nodule, Atherosclerosis of aorta, anxiety, gait disorder, primary insomnia.   INTERVAL HISTORY: MARLISHA VANWYK is a 86 y.o. female here for evaluation and management of Angioimmunoblastic T-cell lymphoma. We are joined today by her daughter, [name]. The patient's last visit with [place] was on 12/23/2019. The pt reports that she is doing well overall.  The pt reports that her cough is nearly resolved and she is no longer experiencing fever. Pt becomes tired easily and feels weak often. Pt has taken Moxifloxacin as prescribed and has three days left. She feels that she has been eating well but her daughter does not agree.   Of note since the patient's last visit, pt has had Chest XR ([id number]) completed on 01/06/2020 with results revealing "Worsening right lower lobe pneumonia."  Lab results today (01/13/20) of CBC w/diff and CMP is as follows: all values are WNL except for Hgb at 11.9, Albumin at 3.3, AST at 12, Total Bilirubin at 0.2. 01/13/2020 LDH at 188  On review of systems, pt reports fatigue, weakness and denies rash, new lumps/bumps, fever, cough and any other symptoms.   MEDICAL HISTORY:  Past Medical History:  Diagnosis Date  . Allergy    codeine, thiazides  . Anxiety    new dx  . Arthritis   . Breast cancer ([facility]) 03/14/12   bx=right breast=Ductal  carcinoma in situ w/calcifications,ER/PR=+,upper inner quad  . Cancer ([facility])    breast  . Cataract   . DVT (deep venous thrombosis) ([facility]) 02/2019   left leg    . Dyspnea   . Edema    both legs feet and toe, abdomen  . Glaucoma    laser treated years ago  . HOH (hard of hearing)   . Hyperlipemia   . Hypertension   . Hypothyroidism   . Pancreatic cyst    benign  . PONV (postoperative nausea and vomiting)   . Pulmonary embolism ([facility]) 02/2019   bilateral   . Radiation 06/11/2012-07/12/2012   17 sessions 4250 cGy, 3 sessions 750 cGy  . Vertigo   . Wears glasses   . Wears partial dentures    partial upper     SURGICAL HISTORY: Past Surgical History:  Procedure Laterality Date  . ABDOMINAL HYSTERECTOMY  1966   1/2 ovary left in   . BREAST SURGERY  1992   lumpectomy-lt  . CATARACT EXTRACTION     b/l  . COLONOSCOPY    . EXCISION MASS NECK Left 03/17/2019    EXCISION MASS NECK (Left Neck)  . EXCISION MASS NECK Left 03/17/2019   Procedure: EXCISION MASS NECK;  Surgeon: Helayne Seminole, MD;  Location: [facility];  Service: ENT;  Laterality: Left;  . EYE SURGERY Bilateral    bilateral cataract removal  . IR IMAGING GUIDED PORT INSERTION  04/23/2019  . JOINT REPLACEMENT  2013   rt total knee  . JOINT REPLACEMENT  1995   lt total knee  . PARTIAL MASTECTOMY WITH NEEDLE LOCALIZATION  04/09/2012   Procedure: PARTIAL MASTECTOMY WITH NEEDLE LOCALIZATION;  Surgeon: Adin Hector, MD;  Location: [facility];  Service: General;  Laterality: Right;  . SKIN BIOPSY Left 03/17/2019   LEFT THIGH  . SKIN BIOPSY Left 03/17/2019   Procedure: Skin Biopsy Left Thigh;  Surgeon: Helayne Seminole, MD;  Location: [facility];  Service: ENT;  Laterality: Left;  . TONSILLECTOMY    . TOTAL KNEE ARTHROPLASTY  08/16/2011   Procedure: TOTAL KNEE ARTHROPLASTY;  Surgeon: Ninetta Lights, MD;  Location: [facility];  Service: Orthopedics;  Laterality: Right;     SOCIAL HISTORY: Social History   Socioeconomic History  . Marital status: Widowed    Spouse name: Not on file  . Number of children: 2  . Years of education: Not on file  . Highest  education level: Not on file  Occupational History  . Occupation: Retired    Comment: Community education officer   Tobacco Use  . Smoking status: Never Smoker  . Smokeless tobacco: Never Used  Vaping Use  . Vaping Use: Never used  Substance and Sexual Activity  . Alcohol use: No  . Drug use: No  . Sexual activity: Not Currently  Other Topics Concern  . Not on file  Social History Narrative  . Not on file   Social Determinants of Health   Financial Resource Strain:   . Difficulty of Paying Living Expenses: Not on file  Food Insecurity:   . Worried About Charity fundraiser in the Last Year: Not on file  . Ran Out of Food in the Last Year: Not on file  Transportation Needs:   . Lack of Transportation (Medical): Not on file  . Lack of Transportation (Non-Medical): Not on file  Physical Activity:   . Days of Exercise per Week: Not on file  . Minutes of Exercise per  Session: Not on file  Stress:   . Feeling of Stress : Not on file  Social Connections:   . Frequency of Communication with Friends and Family: Not on file  . Frequency of Social Gatherings with Friends and Family: Not on file  . Attends Religious Services: Not on file  . Active Member of Clubs or Organizations: Not on file  . Attends Archivist Meetings: Not on file  . Marital Status: Not on file  Intimate Partner Violence:   . Fear of Current or Ex-Partner: Not on file  . Emotionally Abused: Not on file  . Physically Abused: Not on file  . Sexually Abused: Not on file     FAMILY HISTORY: Family History  Problem Relation Age of Onset  . Heart disease Father   . Cancer Maternal Aunt        stomach     ALLERGIES:   is allergic to citalopram, codeine, latex, and thiazide-type diuretics.   MEDICATIONS:  Current Outpatient Medications  Medication Sig Dispense Refill  . amLODipine (NORVASC) 2.5 MG tablet Take 2.5-5 mg by mouth See admin instructions. 5 mg in the morning, 2.5 mg in the evening    .  amoxicillin (AMOXIL) 500 MG capsule Take 4 capsules (2,000 mg total) by mouth once as needed for up to 1 dose. 60 mins prior to dental procedure 4 capsule 0  . apixaban (ELIQUIS) 5 MG TABS tablet Take 1 tablet (5 mg total) by mouth 2 (two) times daily. 180 tablet 1  . benzonatate (TESSALON) 100 MG capsule Take 1 capsule (100 mg total) by mouth 3 (three) times daily as needed for cough. 30 capsule 0  . Cholecalciferol (VITAMIN D) 50 MCG (2000 UT) tablet Take 2,000 Units by mouth 2 (two) times daily.    . Cyanocobalamin (VITAMIN B-12 PO) Take 3,000 mcg by mouth daily.     Marland Kitchen levothyroxine (SYNTHROID) 25 MCG tablet Take 25 mcg by mouth daily before breakfast.     . lidocaine-prilocaine (EMLA) cream Apply to affected area once 30 g 3  . LORazepam (ATIVAN) 0.5 MG tablet TAKE 1/2 TO 1 (ONE-HALF TO ONE) TABLET BY MOUTH EVERY 8 HOURS 30 tablet 0  . moxifloxacin (AVELOX) 400 MG tablet Take 1 tablet (400 mg total) by mouth daily at 8 pm for 10 days. 10 tablet 0  . Multiple Vitamins-Minerals (PRESERVISION AREDS PO) Take 1 capsule by mouth 2 (two) times daily.     . ondansetron (ZOFRAN) 8 MG tablet Take 1 tablet (8 mg total) by mouth 2 (two) times daily as needed. Start on the third day after chemotherapy. 30 tablet 1  . polyethylene glycol (MIRALAX / GLYCOLAX) 17 g packet Take 17 g by mouth daily.    . prochlorperazine (COMPAZINE) 10 MG tablet Take 1 tablet (10 mg total) by mouth every 6 (six) hours as needed (Nausea or vomiting). 30 tablet 1  . sodium chloride (MURO 128) 5 % ophthalmic ointment Place 1 application into both eyes at bedtime.    Marland Kitchen XIIDRA 5 % SOLN Place 1 drop into both eyes daily as needed (dry eyes).     Marland Kitchen zolpidem (AMBIEN) 5 MG tablet Take 5 mg by mouth at bedtime as needed for sleep.      No current facility-administered medications for this visit.     REVIEW OF SYSTEMS:   A 10+ POINT REVIEW OF SYSTEMS WAS OBTAINED including neurology, dermatology, psychiatry, cardiac, respiratory,  lymph, extremities, GI, GU, Musculoskeletal, constitutional, breasts, reproductive, HEENT.  All pertinent positives are noted in the HPI.  All others are negative.   PHYSICAL EXAMINATION: ECOG FS:1 - Symptomatic but completely ambulatory  Vitals:   01/13/20 0922  BP: 134/86  Pulse: 82  Resp: 18  Temp: (!) 96.8 F (36 C)  SpO2: 100%   Wt Readings from Last 3 Encounters:  01/13/20 111 lb 9.6 oz (50.6 kg)  01/06/20 110 lb 14.4 oz (50.3 kg)  12/30/19 114 lb (51.7 kg)   Body mass index is 18.01 kg/m.    GENERAL:alert, in no acute distress and comfortable SKIN: no acute rashes, no significant lesions EYES: conjunctiva are pink and non-injected, sclera anicteric OROPHARYNX: MMM, no exudates, no oropharyngeal erythema or ulceration NECK: supple, no JVD LYMPH:  no palpable lymphadenopathy in the cervical, axillary or inguinal regions LUNGS: clear to auscultation b/l with normal respiratory effort HEART: regular rate & rhythm ABDOMEN:  normoactive bowel sounds , non tender, not distended. No palpable hepatosplenomegaly.  Extremity: no pedal edema PSYCH: alert & oriented x 3 with fluent speech NEURO: no focal motor/sensory deficits  LABORATORY DATA:  I have reviewed the data as listed  CBC Latest Ref Rng & Units 01/13/2020 01/06/2020 12/31/2019  WBC 4.0 - 10.5 K/uL 4.6 10.5 9.3  Hemoglobin 12.0 - 15.0 g/dL 11.9(L) 11.8(L) 12.3  Hematocrit 36 - 46 % 36.8 35.9(L) 37.9  Platelets 150 - 400 K/uL 384 315 182    CMP Latest Ref Rng & Units 01/13/2020 12/31/2019 12/23/2019  Glucose 70 - 99 mg/dL 91 142(H) 86  BUN 8 - 23 mg/dL 19 22 20   Creatinine 0.44 - 1.00 mg/dL 0.67 0.59 0.71  Sodium 135 - 145 mmol/L 143 138 140  Potassium 3.5 - 5.1 mmol/L 3.7 3.7 4.0  Chloride 98 - 111 mmol/L 107 101 107  CO2 22 - 32 mmol/L 25 25 25   Calcium 8.9 - 10.3 mg/dL 10.1 8.9 10.2  Total Protein 6.5 - 8.1 g/dL 6.5 6.5 6.9  Total Bilirubin 0.3 - 1.2 mg/dL 0.2(L) 1.0 0.6  Alkaline Phos 38 - 126 U/L 65 52 66    AST 15 - 41 U/L 12(L) 14(L) 17  ALT 0 - 44 U/L 12 12 11    Component     Latest Ref Rng & Units 03/27/2019  Hepatitis B Surface Ag     NON REACTIVE NON REACTIVE  Hep B Core Total Ab     NON REACTIVE NON REACTIVE  HCV Ab     NON REACTIVE NON REACTIVE  LDH     98 - 192 U/L 252 (H)   . Lab Results  Component Value Date   LDH 188 01/13/2020      04/08/2019 NM PET Image Initial (PI) Skull Base To Thigh (Accession [id number])  04/04/2019 PATHOLOGY   04/07/2019 ECHOCARDIOGRAM  03/28/2019 PATHOLOGY   03/06/2019 CT ANGIOGRAPHY CHEST WITH CONTRAST (Accession [id number])  04/07/2019 ECHOCARDIOGRAM  03/06/2019 ECHOCARDIOGRAM     03/06/2019 PORTABLE CHEST 1 VIEW (Accession [id number])    02/27/2019 CT CHEST, ABDOMEN, AND PELVIS WITH CONTRAST (Accession [id number])    02/17/2019 CHEST XRAY - 2 VIEW (Accession [id number])    RADIOGRAPHIC STUDIES: I have personally reviewed the radiological images as listed and agreed with the findings in the report. DG Chest 2 View  Result Date: 01/06/2020 CLINICAL DATA:  Cough.  History of lymphoma. EXAM: CHEST - 2 VIEW COMPARISON:  Chest x-ray dated December 30, 2019. FINDINGS: Unchanged right chest wall port catheter. Unchanged borderline cardiomegaly. Increasing patchy opacities in the right lower lobe. No pleural  effusion or pneumothorax. No acute osseous abnormality. IMPRESSION: 1. Worsening right lower lobe pneumonia. Electronically Signed   By: Titus Dubin M.D.   On: 01/06/2020 13:50   DG Chest 2 View  Result Date: 12/30/2019 CLINICAL DATA:  T-cell lymphoma, on chemotherapy. Cough. Question pneumonitis or pneumonia. EXAM: CHEST - 2 VIEW COMPARISON:  07/17/2019 FINDINGS: Mild pectus excavatum deformity. Midline trachea. Mild cardiomegaly. Atherosclerosis in the transverse aorta. No pleural effusion or pneumothorax. Right lower and possibly inferior right upper lobe patchy pulmonary opacities, most apparent on the frontal radiograph.  The left lung is clear. Mild S shaped thoracolumbar spine curvature. IMPRESSION: Right-sided pulmonary opacities, suspicious for infection or inflammation. Recommend radiographic follow-up until clearing. Aortic Atherosclerosis (ICD10-I70.0). No thoracic adenopathy. Electronically Signed   By: Abigail Miyamoto M.D.   On: 12/30/2019 12:54     ASSESSMENT & PLAN:   MARKEITA ALICIA is a 86 y.o. female with:  1. 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 SKIN, LEFT THIGH, BIOPSY: - Involvement by angioimmunoblastic T-cell lymphoma." -04/07/2019 Echocardiogram - normal EF -04/08/2019 NM PET Image Initial (PI) Skull Base To Thigh (Accession [id number]) revealed "1. Widespread hypermetabolic adenopathy in the neck, chest, and abdomen/pelvis, primarily in the Deauville 4 and Deauville 5 range. There is also hypermetabolic activity along the posterior nasopharynx in lingual tonsillar regions potentially indicating sites of involvement. 2. The subtle hypodensities in the spine and bony pelvis are not appreciably hypermetabolic may be incidental, surveillance is suggested. 3. Bilateral thyroid activity but especially on the left. Probably from thyroiditis. 4. A 6 mm subpleural nodule anteriorly in the right lower lobe not appreciably hypermetabolic but is below sensitive PET-CT size thresholds and merits surveillance. 5. Mildly accentuated diffuse splenic activity without splenomegaly or focal splenic lesion identified. 6. Other imaging findings of potential clinical significance: Aortic Atherosclerosis (ICD10-I70.0). Coronary atherosclerosis. Large right and small left pleural effusions. Moderate cardiomegaly. Small pericardial effusion. Mild mesenteric edema. Large left renal cyst. Fullness of both renal collecting systems with suspected nonobstructive left  nephrolithiasis. Degenerative grade 1 anterolisthesis at L4-5 at L5-S1. Trace pelvic ascites." -05/28/2019 PET/CT ([id number]) revealed "Complete metabolic response to therapy". -09/16/2019 PET/CT ([id number]) revealed "1. Unfortunately evidence of lymphoma recurrence at sites of previous hypermetabolic adenopathy. Hypermetabolic lymphoid tissue is new from comparison PET-CT 05/28/2019 but at same locations as PET-CT 04/08/2019. The number of metastatic lymph nodes is less than 04/08/2019. Lymphoid tissue at the RIGHT skull base is larger. 2. Three foci of hypermetabolic recurrence are present, lymphoid tissue at the RIGHT base of skull, hypermetabolic RIGHT hilar lymph node and hypermetabolic lymph node in the porta hepatis." -11/25/2019 PET/CT ([id number]) revealed "1. Marked interval improvement in the hypermetabolic disease identified previously in the posterior right nasopharynx, compatible with Deauville 4 today. 2. Decreased hypermetabolism in the right hilar and porta hepatis lymph nodes identified previously (Deauville 3). No new sites of hypermetabolic disease in the chest, abdomen, or pelvis. 3. Similar diffuse thyroid uptake, left greater than right. 4. No substantial change in right-sided pulmonary nodules."  2. Pulmonary Embolism -extensive .likely related to extensive malignancy  3. H/o Breast Cancer  -The patient had bilateral diagnostic  mammography at [facility] 07/11/2011. This  showed some calcifications in the right  breast, which seemed a little bit more  prominent than prior. In the left breast  there was an area of possible  architectural distortion. However left  breast ultrasound the same day showed  no abnormality. 6 month followup was  suggested, and on 02/28/2012 the  patient again had bilateral diagnostic  mammography, now with right  ultrasonography. The microcalcifications  in the right breast appeared increased.  Ultrasound showed a hypoechoic lesion  measuring 5 mm, with no  associated  shadowing. This had been previously  noted and appeared unchanged. Anterior  to that there was a small hypoechoic  mass measuring 4 mm in diameter.  There was felt to be suspicious, and on  03/14/2012 the patient underwent biopsy  of the right breast mass, showing ([id number]  [id number]) ductal carcinoma in situ,  intermediate grade, estrogen receptor  100% and progesterone receptor 100%  positive.   -Bilateral breast MRI was obtained  03/26/2012. This showed only post  biopsy changes in the right breast,  associated with an area of non-masslike  enhancement measuring 2.4 cm. The  left breast was unremarkable, and there  was no enlarged axillary or internal  mammary adenopathy noted.  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 The patient's  subsequent history is as detailed below.   PLAN: -Discussed pt labwork today, 01/13/20; blood counts and chemistries look good, LDH is WNL -Advised pt that Brentuximab and lymphoma can increase immunosuppression.  -Will hold Brentuximab at this time to allow complete resolve of pneumonia infection.  -The pt has no prohibitive toxicities from continuing C12 Brentuximab in 7-10 days.  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 -Recommended that the pt continue to eat well, drink at least 48-64 oz of water each day, and walk 20-30 minutes each day.  -Recommend pt complete current course of antibiotics.  -Continue B-complex vitamin -Will see back in 4 weeks with labs    FOLLOW-UP: Please reschedule Brentuximab-Vedotin C12 from today 01/13/2020 to 01/20/2020 with portflush and labs Plz schedule C13 of Brentuximab-Vedotin as per orders with portflush, labs and MD visit 3 weeks after C12   The total time spent in the appt was 30 minutes and more than  50% was on counseling and direct patient cares.  All of the patient's questions were answered with apparent satisfaction. The patient knows to call the clinic with any problems, questions or concerns.    Sullivan Lone MD [place] AAHIVMS [facility] Hematology/Oncology Physician [facility]  (Office):       [phone] (Work cell):  [phone] (Fax):           [phone]  01/13/2020 10:22 AM   I, Yevette Edwards, am acting as a scribe for Dr. Sullivan Lone.   .I have reviewed the above documentation for accuracy and completeness, and I agree with the above. Brunetta Genera MD

## 2020-01-13 ENCOUNTER — Inpatient Hospital Stay: Payer: Medicare Other

## 2020-01-13 ENCOUNTER — Inpatient Hospital Stay (HOSPITAL_BASED_OUTPATIENT_CLINIC_OR_DEPARTMENT_OTHER): Payer: Medicare Other | Admitting: Hematology

## 2020-01-13 ENCOUNTER — Other Ambulatory Visit: Payer: Self-pay

## 2020-01-13 VITALS — BP 134/86 | HR 82 | Temp 96.8°F | Resp 18 | Ht 66.0 in | Wt 111.6 lb

## 2020-01-13 DIAGNOSIS — Z95828 Presence of other vascular implants and grafts: Secondary | ICD-10-CM

## 2020-01-13 DIAGNOSIS — C865 Angioimmunoblastic T-cell lymphoma: Secondary | ICD-10-CM | POA: Diagnosis not present

## 2020-01-13 DIAGNOSIS — J189 Pneumonia, unspecified organism: Secondary | ICD-10-CM

## 2020-01-13 DIAGNOSIS — Z5111 Encounter for antineoplastic chemotherapy: Secondary | ICD-10-CM

## 2020-01-13 DIAGNOSIS — I2699 Other pulmonary embolism without acute cor pulmonale: Secondary | ICD-10-CM | POA: Diagnosis not present

## 2020-01-13 DIAGNOSIS — Z5112 Encounter for antineoplastic immunotherapy: Secondary | ICD-10-CM | POA: Diagnosis not present

## 2020-01-13 DIAGNOSIS — C8440 Peripheral T-cell lymphoma, not classified, unspecified site: Secondary | ICD-10-CM | POA: Diagnosis not present

## 2020-01-13 DIAGNOSIS — Z853 Personal history of malignant neoplasm of breast: Secondary | ICD-10-CM | POA: Diagnosis not present

## 2020-01-13 LAB — CBC WITH DIFFERENTIAL/PLATELET
Abs Immature Granulocytes: 0.04 10*3/uL (ref 0.00–0.07)
Basophils Absolute: 0.1 10*3/uL (ref 0.0–0.1)
Basophils Relative: 1 %
Eosinophils Absolute: 0.1 10*3/uL (ref 0.0–0.5)
Eosinophils Relative: 2 %
HCT: 36.8 % (ref 36.0–46.0)
Hemoglobin: 11.9 g/dL — ABNORMAL LOW (ref 12.0–15.0)
Immature Granulocytes: 1 %
Lymphocytes Relative: 18 %
Lymphs Abs: 0.8 10*3/uL (ref 0.7–4.0)
MCH: 28.8 pg (ref 26.0–34.0)
MCHC: 32.3 g/dL (ref 30.0–36.0)
MCV: 89.1 fL (ref 80.0–100.0)
Monocytes Absolute: 0.5 10*3/uL (ref 0.1–1.0)
Monocytes Relative: 11 %
Neutro Abs: 3.1 10*3/uL (ref 1.7–7.7)
Neutrophils Relative %: 67 %
Platelets: 384 10*3/uL (ref 150–400)
RBC: 4.13 MIL/uL (ref 3.87–5.11)
RDW: 14.6 % (ref 11.5–15.5)
WBC: 4.6 10*3/uL (ref 4.0–10.5)
nRBC: 0 % (ref 0.0–0.2)

## 2020-01-13 LAB — CMP (CANCER CENTER ONLY)
ALT: 12 U/L (ref 0–44)
AST: 12 U/L — ABNORMAL LOW (ref 15–41)
Albumin: 3.3 g/dL — ABNORMAL LOW (ref 3.5–5.0)
Alkaline Phosphatase: 65 U/L (ref 38–126)
Anion gap: 11 (ref 5–15)
BUN: 19 mg/dL (ref 8–23)
CO2: 25 mmol/L (ref 22–32)
Calcium: 10.1 mg/dL (ref 8.9–10.3)
Chloride: 107 mmol/L (ref 98–111)
Creatinine: 0.67 mg/dL (ref 0.44–1.00)
GFR, Est AFR Am: >60 mL/min (ref >60)
GFR, Estimated: >60 mL/min (ref >60)
Glucose, Bld: 91 mg/dL (ref 70–99)
Potassium: 3.7 mmol/L (ref 3.5–5.1)
Sodium: 143 mmol/L (ref 135–145)
Total Bilirubin: 0.2 mg/dL — ABNORMAL LOW (ref 0.3–1.2)
Total Protein: 6.5 g/dL (ref 6.5–8.1)

## 2020-01-13 LAB — LACTATE DEHYDROGENASE: LDH: 188 U/L (ref 98–192)

## 2020-01-13 MED ORDER — SODIUM CHLORIDE 0.9% FLUSH
10.0000 mL | Freq: Once | INTRAVENOUS | Status: AC
  Administered 2020-01-13: 10 mL
  Filled 2020-01-13: qty 10

## 2020-01-15 ENCOUNTER — Telehealth: Payer: Self-pay | Admitting: Hematology

## 2020-01-15 NOTE — Telephone Encounter (Signed)
Scheduled per 08/24 los, spoke with patient's daughter. Patient will be notified of upcoming appointment.

## 2020-01-20 ENCOUNTER — Inpatient Hospital Stay: Payer: Medicare Other

## 2020-01-20 ENCOUNTER — Other Ambulatory Visit: Payer: Self-pay

## 2020-01-20 ENCOUNTER — Other Ambulatory Visit: Payer: Self-pay | Admitting: *Deleted

## 2020-01-20 VITALS — BP 140/80 | HR 70 | Temp 98.0°F | Resp 18 | Wt 110.8 lb

## 2020-01-20 DIAGNOSIS — C8440 Peripheral T-cell lymphoma, not classified, unspecified site: Secondary | ICD-10-CM

## 2020-01-20 DIAGNOSIS — Z7189 Other specified counseling: Secondary | ICD-10-CM

## 2020-01-20 DIAGNOSIS — Z5112 Encounter for antineoplastic immunotherapy: Secondary | ICD-10-CM | POA: Diagnosis not present

## 2020-01-20 DIAGNOSIS — C865 Angioimmunoblastic T-cell lymphoma: Secondary | ICD-10-CM | POA: Diagnosis not present

## 2020-01-20 DIAGNOSIS — Z853 Personal history of malignant neoplasm of breast: Secondary | ICD-10-CM | POA: Diagnosis not present

## 2020-01-20 DIAGNOSIS — I2699 Other pulmonary embolism without acute cor pulmonale: Secondary | ICD-10-CM | POA: Diagnosis not present

## 2020-01-20 DIAGNOSIS — Z95828 Presence of other vascular implants and grafts: Secondary | ICD-10-CM

## 2020-01-20 DIAGNOSIS — J189 Pneumonia, unspecified organism: Secondary | ICD-10-CM | POA: Diagnosis not present

## 2020-01-20 LAB — CBC WITH DIFFERENTIAL (CANCER CENTER ONLY)
Abs Immature Granulocytes: 0.02 10*3/uL (ref 0.00–0.07)
Basophils Absolute: 0.1 10*3/uL (ref 0.0–0.1)
Basophils Relative: 1 %
Eosinophils Absolute: 0.1 10*3/uL (ref 0.0–0.5)
Eosinophils Relative: 3 %
HCT: 39.9 % (ref 36.0–46.0)
Hemoglobin: 12.9 g/dL (ref 12.0–15.0)
Immature Granulocytes: 1 %
Lymphocytes Relative: 21 %
Lymphs Abs: 0.9 10*3/uL (ref 0.7–4.0)
MCH: 29.3 pg (ref 26.0–34.0)
MCHC: 32.3 g/dL (ref 30.0–36.0)
MCV: 90.5 fL (ref 80.0–100.0)
Monocytes Absolute: 0.5 10*3/uL (ref 0.1–1.0)
Monocytes Relative: 12 %
Neutro Abs: 2.6 10*3/uL (ref 1.7–7.7)
Neutrophils Relative %: 62 %
Platelet Count: 258 10*3/uL (ref 150–400)
RBC: 4.41 MIL/uL (ref 3.87–5.11)
RDW: 15.1 % (ref 11.5–15.5)
WBC Count: 4.2 10*3/uL (ref 4.0–10.5)
nRBC: 0 % (ref 0.0–0.2)

## 2020-01-20 LAB — CMP (CANCER CENTER ONLY)
ALT: 9 U/L (ref 0–44)
AST: 14 U/L — ABNORMAL LOW (ref 15–41)
Albumin: 3.8 g/dL (ref 3.5–5.0)
Alkaline Phosphatase: 71 U/L (ref 38–126)
Anion gap: 8 (ref 5–15)
BUN: 17 mg/dL (ref 8–23)
CO2: 29 mmol/L (ref 22–32)
Calcium: 10.6 mg/dL — ABNORMAL HIGH (ref 8.9–10.3)
Chloride: 104 mmol/L (ref 98–111)
Creatinine: 0.64 mg/dL (ref 0.44–1.00)
GFR, Est AFR Am: >60 mL/min (ref >60)
GFR, Estimated: >60 mL/min (ref >60)
Glucose, Bld: 88 mg/dL (ref 70–99)
Potassium: 3.9 mmol/L (ref 3.5–5.1)
Sodium: 141 mmol/L (ref 135–145)
Total Bilirubin: 0.4 mg/dL (ref 0.3–1.2)
Total Protein: 6.9 g/dL (ref 6.5–8.1)

## 2020-01-20 LAB — LACTATE DEHYDROGENASE: LDH: 203 U/L — ABNORMAL HIGH (ref 98–192)

## 2020-01-20 MED ORDER — SODIUM CHLORIDE 0.9% FLUSH
10.0000 mL | INTRAVENOUS | Status: DC | PRN
  Administered 2020-01-20: 10 mL
  Filled 2020-01-20: qty 10

## 2020-01-20 MED ORDER — FAMOTIDINE IN NACL 20-0.9 MG/50ML-% IV SOLN
INTRAVENOUS | Status: AC
  Filled 2020-01-20: qty 50

## 2020-01-20 MED ORDER — DIPHENHYDRAMINE HCL 50 MG/ML IJ SOLN
INTRAMUSCULAR | Status: AC
  Filled 2020-01-20: qty 1

## 2020-01-20 MED ORDER — ONDANSETRON HCL 4 MG/2ML IJ SOLN
4.0000 mg | Freq: Once | INTRAMUSCULAR | Status: AC
  Administered 2020-01-20: 4 mg via INTRAVENOUS

## 2020-01-20 MED ORDER — FAMOTIDINE IN NACL 20-0.9 MG/50ML-% IV SOLN
20.0000 mg | Freq: Once | INTRAVENOUS | Status: AC
  Administered 2020-01-20: 20 mg via INTRAVENOUS

## 2020-01-20 MED ORDER — SODIUM CHLORIDE 0.9 % IV SOLN
1.8000 mg/kg | Freq: Once | INTRAVENOUS | Status: AC
  Administered 2020-01-20: 100 mg via INTRAVENOUS
  Filled 2020-01-20: qty 20

## 2020-01-20 MED ORDER — ONDANSETRON HCL 4 MG/2ML IJ SOLN
INTRAMUSCULAR | Status: AC
  Filled 2020-01-20: qty 2

## 2020-01-20 MED ORDER — HEPARIN SOD (PORK) LOCK FLUSH 100 UNIT/ML IV SOLN
500.0000 [IU] | Freq: Once | INTRAVENOUS | Status: AC | PRN
  Administered 2020-01-20: 500 [IU]
  Filled 2020-01-20: qty 5

## 2020-01-20 MED ORDER — ACETAMINOPHEN 325 MG PO TABS
ORAL_TABLET | ORAL | Status: AC
  Filled 2020-01-20: qty 2

## 2020-01-20 MED ORDER — SODIUM CHLORIDE 0.9 % IV SOLN
Freq: Once | INTRAVENOUS | Status: AC
  Filled 2020-01-20: qty 250

## 2020-01-20 MED ORDER — SODIUM CHLORIDE 0.9% FLUSH
10.0000 mL | Freq: Once | INTRAVENOUS | Status: AC
  Administered 2020-01-20: 10 mL
  Filled 2020-01-20: qty 10

## 2020-01-20 MED ORDER — DIPHENHYDRAMINE HCL 50 MG/ML IJ SOLN
50.0000 mg | Freq: Once | INTRAMUSCULAR | Status: AC
  Administered 2020-01-20: 50 mg via INTRAVENOUS

## 2020-01-20 MED ORDER — ACETAMINOPHEN 325 MG PO TABS
650.0000 mg | ORAL_TABLET | Freq: Once | ORAL | Status: AC
  Administered 2020-01-20: 650 mg via ORAL

## 2020-01-20 MED ORDER — DEXAMETHASONE SODIUM PHOSPHATE 10 MG/ML IJ SOLN
INTRAMUSCULAR | Status: AC
  Filled 2020-01-20: qty 1

## 2020-01-20 MED ORDER — DEXAMETHASONE SODIUM PHOSPHATE 10 MG/ML IJ SOLN
5.0000 mg | Freq: Once | INTRAMUSCULAR | Status: AC
  Administered 2020-01-20: 5 mg via INTRAVENOUS

## 2020-01-20 NOTE — Patient Instructions (Signed)
[place] Discharge Instructions for Patients Receiving Chemotherapy  Today you received the following chemotherapy agents: brentuximab vedotin (ADCENTRIS).  To help prevent nausea and vomiting after your treatment, we encourage you to take your nausea medication as directed.  If you develop nausea and vomiting that is not controlled by your nausea medication, call the clinic.   BELOW ARE SYMPTOMS THAT SHOULD BE REPORTED IMMEDIATELY:  *FEVER GREATER THAN 100.5 F  *CHILLS WITH OR WITHOUT FEVER  NAUSEA AND VOMITING THAT IS NOT CONTROLLED WITH YOUR NAUSEA MEDICATION  *UNUSUAL SHORTNESS OF BREATH  *UNUSUAL BRUISING OR BLEEDING  TENDERNESS IN MOUTH AND THROAT WITH OR WITHOUT PRESENCE OF ULCERS  *URINARY PROBLEMS  *BOWEL PROBLEMS  UNUSUAL RASH Items with * indicate a potential emergency and should be followed up as soon as possible.  Feel free to call the clinic should you have any questions or concerns. The clinic phone number is [phone].  Please show the [place] at check-in to the Emergency Department and triage nurse.

## 2020-01-28 ENCOUNTER — Other Ambulatory Visit: Payer: Self-pay | Admitting: Hematology

## 2020-01-28 NOTE — Telephone Encounter (Signed)
Please review for refill.  

## 2020-02-09 NOTE — Progress Notes (Signed)
HEMATOLOGY/ONCOLOGY CLINIC NOTE  Date of Service: 02/10/2020  Patient Care Team: Lajean Manes, MD as PCP - General (Internal Medicine) Buford Dresser, MD as PCP - Cardiology (Cardiology)  REFERRING PHYSICIAN: Lajean Manes, MD  CHIEF COMPLAINTS/PURPOSE OF CONSULTATION:  Continue mx of Recently Diagnosed Angioimmunoblastic T cell lymphoma      HISTORY OF PRESENTING ILLNESS:   Sarah [name] is a wonderful 86 y.o. female who has been referred to [place] by Lajean Manes, MD for evaluation and management of Possible lymphoma . She is accompanied today by [name] . The pt reports that she is doing well overall.   Pending Surgical pathology from 03/17/2019 Dr.Manny will contact clinic about pathology   August 25th/27th she began coughing and sneezing. On Sept 3 went to doctors and they said it was allergies. She began having rashes (little red dots). Went to PCP and they prescribed prednisone and for several days and the medication helped. But as soon as she completed the prescription symptoms reoccurred.   She had a chest X Ray done because she was having a severe cough on 02/17/2019 revealing "bilateral effusions in this patient with history of breast cancer,of uncertain significance with question of right lower lobe pulmonary nodule, consider chest CT for further assessment. Atherosclerotic changes in the thoracic aorta."  Dr. Amy Martinique biopsied the rashes on her back 02/21/2019 and it was determined that it could be because of an allergy to medication. Dr.Stoneking stopped her blood pressure medication from September- October but ther rash still present. The itching started 1 to 2 months ago. The rash would come and go and was mostly on her back. She still has rash on her left thigh.  She is using triaconazole and cetrizine.  She has lumps behind ear and abdomin and she states that they have swollen twice the size within 1 and half months  She has no appetite and  has changes in her taste that began around August. She has changes in bowels. They are more loose and smaller In size. She also has no control over bowels.  She feels fatigued and it has increased since August.   She has a cough that starts in September  that has made her hoarse.   She had a CT scan on 02/27/2019 because she was having severe cough and her stomach was very extended. Revealing "1. Extensive lymphadenopathy throughout the chest, abdomen, and pelvis, with index lymph nodes identified above. Splenomegaly. Findings are most consistent with lymphoma with recurrent, metastatic breast malignancy less favored. 2. There is generally osteopenic appearance of the skeleton. There are numerous subtle hypodense lesions, particularly of the pelvis (e.g. Series 2, image 88) and select vertebral bodies, for example L4 (series 8, image 92). This is suspicious although not definitive for osseous metastatic disease. Characterization for metabolic activity by nuclear scintigraphic bone scan or PET-CT would be helpful to further evaluate. 3. Moderate right, small left pleural effusions with associated atelectasis or consolidation. Subpleural radiation fibrosis of the anterior right lung. 4. There is a new subpleural pulmonary nodule of the anterior right middle lobe measuring 6 mm (series 4, image 105), nonspecific. There is no obvious pleural thickening or nodularity definitive for metastatic disease. 5.  Small volume ascites. 6. There is a fluid attenuation lesion of the pancreatic uncinate measuring 2.4 x 1.3 cm (series 2, image 63), not substantially changed when compared to remote prior MR examination dated 02/23/2012 and likely sequelae of a prior pseudocyst versus incidental pancreatic IPMN. 7.  Cardiomegaly."  Hospitalized from 03/06/2019-03/08/2019. She presenting with acute shortness of breath, leg swelling,left hand swelling and some weight gain despite poor appetite. In ED, CTA chest revealed at least  submassive PE, moderate right pleural effusion, diffuse LAD concerning for lymphoma. Started on heparin drip. PCCM consulted for guidance. She had thoracocentesis with removal of 1200 cc cloudy exudative pleural fluid. Cardiology consulted for elevated which was thought to be demand ischemia from right heart strain in the setting of PE. Patient was transitioned to subcu Lovenox by pulmonology the next day and remained stable. Unusual appearance of tracheal air column at the thoracic inlet. Correlate with any history of swallowing difficulty or changes in voice with dedicated imaging with chest CT and or CT of the neck as indicated.  She has a nonhealing ulcer on her right tibialis anterior that starting weeping one week ago. Of note since the patient's last visit, pt has had CHEST XRAY - 2 VIEW (Accession [id number]) completed on 02/17/2019 with results revealing "Bilateral effusions in this patient with history of breast cancer,of uncertain significance with question of right lower lobepulmonary nodule, consider chest CT for furtherassessment.Unusual appearance of tracheal air column at the thoracic inlet.Correlate with any history of swallowing difficulty or changes in voice with dedicated imaging with chest CT and or CT of the neck as Indicated. Atherosclerotic changes in the thoracic aorta."  Of note since the patient's last visit, pt has had CT CHEST, ABDOMEN, AND PELVIS WITH CONTRAST (Accession [id number]) completed on 02/27/2019 with results revealing "1. Extensive lymphadenopathy throughout the chest, abdomen, and pelvis, with index lymph nodes identified above. Splenomegaly.Findings are most consistent with lymphoma with recurrent, metastatic breast malignancy less favored. 2. There is generally osteopenic appearance of the skeleton. There are numerous subtle hypodense lesions, particularly of the pelvis (e.g. Series 2, image 88) and select vertebral bodies, for example L4 (series 8, image 92).  This is suspicious although not definitive for osseous metastatic disease. Characterization for metabolic activity by nuclear scintigraphic bone scan or PET-CT would be helpful to further evaluate.  3. Moderate right, small left pleural effusions with associated atelectasis or consolidation. Subpleural radiation fibrosis of the anterior right lung.4. There is a new subpleural pulmonary nodule of the anterior right middle lobe measuring 6 mm (series 4, image 105), nonspecific. There is no obvious pleural thickening or nodularity definitive for metastatic disease.5.  Small volume ascites. 6. There is a fluid attenuation lesion of the pancreatic uncinate measuring 2.4 x 1.3 cm (series 2, image 63), not substantially changed when compared to remote prior MR examination dated 02/23/2012 and likely sequelae of a prior pseudocyst versus incidental pancreatic IPMN.7.  Cardiomegaly.8. Other chronic and incidental findings as detailed above. Aortic Atherosclerosis (ICD10-I70.0)."  Of note since the patient's last visit, pt has had PORTABLE CHEST 1 VIEW (Accession [id number]) completed on 03/06/2019 with results revealing "1. Moderate size right and small left pleural effusions appear not significantly changed since 02/27/19. 2. No new cardiopulmonary abnormality."  Of note since the patient's last visit, pt has had ECHOCARDIOGRAM  completed on 03/06/2019 with results revealing  "1. Left ventricular ejection fraction, by visual estimation, is 60 to 65%. The left ventricle has normal function. Normal left ventricular size. There is no left ventricular hypertrophy. 2. Left ventricular diastolic Doppler parameters are consistent with impaired relaxation pattern of LV diastolic filling. 3. Global right ventricle has mildly reduced systolic function.The right ventricular size is moderately enlarged. No increase in right ventricular wall thickness. 4. Left atrial size was normal.  5. Right atrial size was mildly dilated.  6. Trivial pericardial effusion is present. 7. The mitral valve is normal in structure. Trace mitral valve regurgitation. No evidence of mitral stenosis. 8. The tricuspid valve is normal in structure. Tricuspid valve regurgitation severe. 9. The aortic valve is normal in structure. Aortic valve regurgitation was not visualized by color flow Doppler. Structurally normal aortic valve, with no evidence of sclerosis or stenosis.10. The pulmonic valve was normal in structure. Pulmonic valve regurgitation is mild by color flow Doppler.11. Mildly elevated pulmonary artery systolic pressure.12. The inferior vena cava is normal in size with greater than 50% respiratory variability, suggesting right atrial pressure of 3 mmHg."  Of note since the patient's last visit, pt has had CT ANGIOGRAPHY CHEST WITH CONTRAST (Accession [id number]) completed on 03/06/2019 with results revealing "1. Pulmonary embolus arising from the distal left main pulmonary artery with extension into multiple left lower lobe pulmonary arterial branches. There is also incompletely obstructing pulmonary embolus in the proximal left upper lobe pulmonary artery. More distal pulmonary embolus in the right lower lobe pulmonary artery. Positive for acute PE with CT evidence of right heart strain (RV/LV Ratio = 1.6) consistent with at least submassive (intermediate risk) PE. The presence of right heart strain has been associated with an increased risk of morbidity and mortality. Please activate Code PE by paging [phone]. 2. Sizable pleural effusion on the right with smaller pleural effusion on the left. Consolidation and compressive atelectasis throughout most of the right lower lobe. Milder atelectasis left base. Nodular opacities on the right, likely metastatic foci.3.  Stable adenopathy compared to 1 week prior.4. Enlargement of spleen, incompletely visualized but documented CT 1 week prior. Concern for potential lymphoma. 5. Aortic atherosclerosis.  No aneurysm or dissection evident. Foci of coronary artery calcification noted."  Most recent lab results (03/06/2019) of CBC is as follows: all values are WNL except for Platelets at 72, Eosinophils Absolute at 0.6,Abs Immature Granulocytes at 0.08 .  On review of systems, pt reports rashes, digestive issues and denies back pain, abdominal pain and any other symptoms.   On PMHx the pt reports Hypothyroidism, MCI, Hypercholesterolemia, Tracheal anomaly, Hypertension, Pulmonary nodule, Atherosclerosis of aorta, anxiety, gait disorder, primary insomnia.   INTERVAL HISTORY: Sarah [name] is a 86 y.o. female here for evaluation and management of Angioimmunoblastic T-cell lymphoma. We are joined today by her daughter, [name]. The patient's last visit with [place] was on 01/13/2020. The pt reports that she is doing well overall.  The pt reports that her weakness and fatigue is stable. She denies any recurrence of cough or rash. Pt believes that she is eating well and has increased her dietary carbohydrates.   Lab results today (02/10/20) of CBC w/diff and CMP is as follows: all values are WNL except for WBC at 3.4K. 02/10/2020 LDH at 190  On review of systems, pt reports weakness, fatigue and denies rash, cough, SOB, fevers, chills, night sweats, leg swelling, tingling/numbness in hands/feet, weight loss, new lumps/bumps and any other symptoms.   MEDICAL HISTORY:  Past Medical History:  Diagnosis Date  . Allergy    codeine, thiazides  . Anxiety    new dx  . Arthritis   . Breast cancer ([place]) 03/14/12   bx=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,ER/PR=+,upper inner quad  . Cancer ([place])    breast  . Cataract   . DVT (deep venous thrombosis) ([place]) 02/2019   left leg  . Dyspnea   . Edema    both legs  feet and toe, abdomen  . Glaucoma    laser treated years ago  . HOH (hard of hearing)   . Hyperlipemia   . Hypertension   . Hypothyroidism   . Pancreatic cyst    benign  .  PONV (postoperative nausea and vomiting)   . Pulmonary embolism ([place]) 02/2019   bilateral   . Radiation 06/11/2012-07/12/2012   17 sessions 4250 cGy, 3 sessions 750 cGy  . Vertigo   . Wears glasses   . Wears partial dentures    partial upper     SURGICAL HISTORY: Past Surgical History:  Procedure Laterality Date  . ABDOMINAL HYSTERECTOMY  1966   1/2 ovary left in   . BREAST SURGERY  1992   lumpectomy-lt  . CATARACT EXTRACTION     b/l  . COLONOSCOPY    . EXCISION MASS NECK Left 03/17/2019    EXCISION MASS NECK (Left Neck)  . EXCISION MASS NECK Left 03/17/2019   Procedure: EXCISION MASS NECK;  Surgeon: Helayne Seminole, MD;  Location: [facility];  Service: ENT;  Laterality: Left;  . EYE SURGERY Bilateral    bilateral cataract removal  . IR IMAGING GUIDED PORT INSERTION  04/23/2019  . JOINT REPLACEMENT  2013   rt total knee  . JOINT REPLACEMENT  1995   lt total knee  . PARTIAL MASTECTOMY WITH NEEDLE LOCALIZATION  04/09/2012   Procedure: PARTIAL MASTECTOMY WITH NEEDLE LOCALIZATION;  Surgeon: Adin Hector, MD;  Location: [facility];  Service: General;  Laterality: Right;  . SKIN BIOPSY Left 03/17/2019   LEFT THIGH  . SKIN BIOPSY Left 03/17/2019   Procedure: Skin Biopsy Left Thigh;  Surgeon: Helayne Seminole, MD;  Location: [facility];  Service: ENT;  Laterality: Left;  . TONSILLECTOMY    . TOTAL KNEE ARTHROPLASTY  08/16/2011   Procedure: TOTAL KNEE ARTHROPLASTY;  Surgeon: Ninetta Lights, MD;  Location: [facility];  Service: Orthopedics;  Laterality: Right;     SOCIAL HISTORY: Social History   Socioeconomic History  . Marital status: Widowed    Spouse name: Not on file  . Number of children: 2  . Years of education: Not on file  . Highest education level: Not on file  Occupational History  . Occupation: Retired    Comment: Community education officer   Tobacco Use  . Smoking status: Never Smoker  . Smokeless tobacco: Never Used  Vaping Use  . Vaping Use: Never  used  Substance and Sexual Activity  . Alcohol use: No  . Drug use: No  . Sexual activity: Not Currently  Other Topics Concern  . Not on file  Social History Narrative  . Not on file   Social Determinants of Health   Financial Resource Strain:   . Difficulty of Paying Living Expenses: Not on file  Food Insecurity:   . Worried About Charity fundraiser in the Last Year: Not on file  . Ran Out of Food in the Last Year: Not on file  Transportation Needs:   . Lack of Transportation (Medical): Not on file  . Lack of Transportation (Non-Medical): Not on file  Physical Activity:   . Days of Exercise per Week: Not on file  . Minutes of Exercise per Session: Not on file  Stress:   . Feeling of Stress : Not on file  Social Connections:   . Frequency of Communication with Friends and Family: Not on file  . Frequency of Social Gatherings with Friends and Family: Not  on file  . Attends Religious Services: Not on file  . Active Member of Clubs or Organizations: Not on file  . Attends Archivist Meetings: Not on file  . Marital Status: Not on file  Intimate Partner Violence:   . Fear of Current or Ex-Partner: Not on file  . Emotionally Abused: Not on file  . Physically Abused: Not on file  . Sexually Abused: Not on file     FAMILY HISTORY: Family History  Problem Relation Age of Onset  . Heart disease Father   . Cancer Maternal Aunt        stomach     ALLERGIES:   is allergic to citalopram, codeine, latex, and thiazide-type diuretics.   MEDICATIONS:  Current Outpatient Medications  Medication Sig Dispense Refill  . amLODipine (NORVASC) 2.5 MG tablet Take 2.5-5 mg by mouth See admin instructions. 5 mg in the morning, 2.5 mg in the evening    . amoxicillin (AMOXIL) 500 MG capsule Take 4 capsules (2,000 mg total) by mouth once as needed for up to 1 dose. 60 mins prior to dental procedure 4 capsule 0  . apixaban (ELIQUIS) 5 MG TABS tablet Take 1 tablet (5 mg total) by  mouth 2 (two) times daily. 180 tablet 1  . benzonatate (TESSALON) 100 MG capsule Take 1 capsule (100 mg total) by mouth 3 (three) times daily as needed for cough. 30 capsule 0  . Cholecalciferol (VITAMIN D) 50 MCG (2000 UT) tablet Take 2,000 Units by mouth 2 (two) times daily.    . Cyanocobalamin (VITAMIN B-12 PO) Take 3,000 mcg by mouth daily.     Marland Kitchen levothyroxine (SYNTHROID) 25 MCG tablet Take 25 mcg by mouth daily before breakfast.     . lidocaine-prilocaine (EMLA) cream Apply to affected area once 30 g 3  . LORazepam (ATIVAN) 0.5 MG tablet TAKE 1/2 (ONE-HALF) TO 1 TABLET BY MOUTH EVERY 8 HOURS 30 tablet 0  . Multiple Vitamins-Minerals (PRESERVISION AREDS PO) Take 1 capsule by mouth 2 (two) times daily.     . ondansetron (ZOFRAN) 8 MG tablet Take 1 tablet (8 mg total) by mouth 2 (two) times daily as needed. Start on the third day after chemotherapy. 30 tablet 1  . polyethylene glycol (MIRALAX / GLYCOLAX) 17 g packet Take 17 g by mouth daily.    . prochlorperazine (COMPAZINE) 10 MG tablet Take 1 tablet (10 mg total) by mouth every 6 (six) hours as needed (Nausea or vomiting). 30 tablet 1  . sodium chloride (MURO 128) 5 % ophthalmic ointment Place 1 application into both eyes at bedtime.    Marland Kitchen XIIDRA 5 % SOLN Place 1 drop into both eyes daily as needed (dry eyes).     Marland Kitchen zolpidem (AMBIEN) 5 MG tablet Take 5 mg by mouth at bedtime as needed for sleep.      No current facility-administered medications for this visit.   Facility-Administered Medications Ordered in Other Visits  Medication Dose Route Frequency Provider Last Rate Last Admin  . brentuximab vedotin (ADCETRIS) 100 mg in sodium chloride 0.9 % 100 mL chemo infusion  1.8 mg/kg (Order-Specific) Intravenous Once Brunetta Genera, MD      . dexamethasone (DECADRON) injection 5 mg  5 mg Intravenous Once Brunetta Genera, MD      . diphenhydrAMINE (BENADRYL) injection 50 mg  50 mg Intravenous Once Brunetta Genera, MD      . heparin  lock flush 100 unit/mL  500 Units Intracatheter Once PRN Irene Limbo,  Cloria Spring, MD      . ondansetron [facility]) injection 4 mg  4 mg Intravenous Once Brunetta Genera, MD      . sodium chloride flush (NS) 0.9 % injection 10 mL  10 mL Intracatheter PRN Brunetta Genera, MD         REVIEW OF SYSTEMS:   A 10+ POINT REVIEW OF SYSTEMS WAS OBTAINED including neurology, dermatology, psychiatry, cardiac, respiratory, lymph, extremities, GI, GU, Musculoskeletal, constitutional, breasts, reproductive, HEENT.  All pertinent positives are noted in the HPI.  All others are negative.   PHYSICAL EXAMINATION: ECOG FS:1 - Symptomatic but completely ambulatory  Vitals:   02/10/20 0905  BP: 119/82  Pulse: 82  Resp: 18  Temp: (!) 97.2 F (36.2 C)  SpO2: 98%   Wt Readings from Last 3 Encounters:  02/10/20 110 lb 9.6 oz (50.2 kg)  01/20/20 110 lb 12 oz (50.2 kg)  01/13/20 111 lb 9.6 oz (50.6 kg)   Body mass index is 17.85 kg/m.    Exam was given in a chair   GENERAL:alert, in no acute distress and comfortable SKIN: no acute rashes, no significant lesions EYES: conjunctiva are pink and non-injected, sclera anicteric OROPHARYNX: MMM, no exudates, no oropharyngeal erythema or ulceration NECK: supple, no JVD LYMPH:  no palpable lymphadenopathy in the cervical, axillary or inguinal regions LUNGS: clear to auscultation b/l with normal respiratory effort HEART: regular rate & rhythm ABDOMEN:  normoactive bowel sounds , non tender, not distended. No palpable hepatomegaly. Barely palpable spleen.  Extremity: no pedal edema PSYCH: alert & oriented x 3 with fluent speech NEURO: no focal motor/sensory deficits  LABORATORY DATA:  I have reviewed the data as listed  CBC Latest Ref Rng & Units 02/10/2020 01/20/2020 01/13/2020  WBC 4.0 - 10.5 K/uL 3.4(L) 4.2 4.6  Hemoglobin 12.0 - 15.0 g/dL 13.2 12.9 11.9(L)  Hematocrit 36 - 46 % 40.5 39.9 36.8  Platelets 150 - 400 K/uL 194 258 384    CMP Latest  Ref Rng & Units 02/10/2020 01/20/2020 01/13/2020  Glucose 70 - 99 mg/dL 97 88 91  BUN 8 - 23 mg/dL 23 17 19   Creatinine 0.44 - 1.00 mg/dL 0.74 0.64 0.67  Sodium 135 - 145 mmol/L 142 141 143  Potassium 3.5 - 5.1 mmol/L 3.6 3.9 3.7  Chloride 98 - 111 mmol/L 106 104 107  CO2 22 - 32 mmol/L 23 29 25   Calcium 8.9 - 10.3 mg/dL 9.6 10.6(H) 10.1  Total Protein 6.5 - 8.1 g/dL 6.9 6.9 6.5  Total Bilirubin 0.3 - 1.2 mg/dL 0.6 0.4 0.2(L)  Alkaline Phos 38 - 126 U/L 68 71 65  AST 15 - 41 U/L 16 14(L) 12(L)  ALT 0 - 44 U/L 14 9 12    Component     Latest Ref Rng & Units 03/27/2019  Hepatitis B Surface Ag     NON REACTIVE NON REACTIVE  Hep B Core Total Ab     NON REACTIVE NON REACTIVE  HCV Ab     NON REACTIVE NON REACTIVE  LDH     98 - 192 U/L 252 (H)   . Lab Results  Component Value Date   LDH 190 02/10/2020      04/08/2019 NM PET Image Initial (PI) Skull Base To Thigh (Accession [id number])  04/04/2019 PATHOLOGY   04/07/2019 ECHOCARDIOGRAM  03/28/2019 PATHOLOGY   03/06/2019 CT ANGIOGRAPHY CHEST WITH CONTRAST (Accession [id number])  04/07/2019 ECHOCARDIOGRAM  03/06/2019 ECHOCARDIOGRAM     03/06/2019 PORTABLE CHEST 1 VIEW (Accession  [id number])    02/27/2019 CT CHEST, ABDOMEN, AND PELVIS WITH CONTRAST (Accession [id number])    02/17/2019 CHEST XRAY - 2 VIEW (Accession [id number])    RADIOGRAPHIC STUDIES: I have personally reviewed the radiological images as listed and agreed with the findings in the report. No results found.   ASSESSMENT & PLAN:   KENDYL FESTA is a 86 y.o. female with:  1. 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 SKIN, LEFT THIGH, BIOPSY: - Involvement by angioimmunoblastic T-cell lymphoma." -04/07/2019 Echocardiogram - normal EF -04/08/2019 NM PET Image Initial (PI) Skull Base To Thigh  (Accession [id number]) revealed "1. Widespread hypermetabolic adenopathy in the neck, chest, and abdomen/pelvis, primarily in the Deauville 4 and Deauville 5 range. There is also hypermetabolic activity along the posterior nasopharynx in lingual tonsillar regions potentially indicating sites of involvement. 2. The subtle hypodensities in the spine and bony pelvis are not appreciably hypermetabolic may be incidental, surveillance is suggested. 3. Bilateral thyroid activity but especially on the left. Probably from thyroiditis. 4. A 6 mm subpleural nodule anteriorly in the right lower lobe not appreciably hypermetabolic but is below sensitive PET-CT size thresholds and merits surveillance. 5. Mildly accentuated diffuse splenic activity without splenomegaly or focal splenic lesion identified. 6. Other imaging findings of potential clinical significance: Aortic Atherosclerosis (ICD10-I70.0). Coronary atherosclerosis. Large right and small left pleural effusions. Moderate cardiomegaly. Small pericardial effusion. Mild mesenteric edema. Large left renal cyst. Fullness of both renal collecting systems with suspected nonobstructive left nephrolithiasis. Degenerative grade 1 anterolisthesis at L4-5 at L5-S1. Trace pelvic ascites." -05/28/2019 PET/CT ([id number]) revealed "Complete metabolic response to therapy". -09/16/2019 PET/CT ([id number]) revealed "1. Unfortunately evidence of lymphoma recurrence at sites of previous hypermetabolic adenopathy. Hypermetabolic lymphoid tissue is new from comparison PET-CT 05/28/2019 but at same locations as PET-CT 04/08/2019. The number of metastatic lymph nodes is less than 04/08/2019. Lymphoid tissue at the RIGHT skull base is larger. 2. Three foci of hypermetabolic recurrence are present, lymphoid tissue at the RIGHT base of skull, hypermetabolic RIGHT hilar lymph node and hypermetabolic lymph node in the porta hepatis." -11/25/2019 PET/CT ([id number]) revealed "1. Marked interval  improvement in the hypermetabolic disease identified previously in the posterior right nasopharynx, compatible with Deauville 4 today. 2. Decreased hypermetabolism in the right hilar and porta hepatis lymph nodes identified previously (Deauville 3). No new sites of hypermetabolic disease in the chest, abdomen, or pelvis. 3. Similar diffuse thyroid uptake, left greater than right. 4. No substantial change in right-sided pulmonary nodules."  2. Pulmonary Embolism -extensive .likely related to extensive malignancy  3. H/o Breast Cancer  -The patient had bilateral diagnostic  mammography at [facility] 07/11/2011. This  showed some calcifications in the right  breast, which seemed a little bit more  prominent than prior. In the left breast  there was an area of possible  architectural distortion. However left  breast ultrasound the same day showed  no abnormality. 6 month followup was  suggested, and on 02/28/2012 the  patient again had bilateral diagnostic  mammography, now with right  ultrasonography. The microcalcifications  in the right breast appeared increased.  Ultrasound showed a hypoechoic lesion  measuring 5 mm, with no associated  shadowing. This had been previously  noted and appeared unchanged. Anterior  to that there was a small hypoechoic  mass measuring 4 mm in diameter.  There was felt to be suspicious, and on  03/14/2012 the patient underwent biopsy  of the right breast mass, showing ([id number]  [id number]) ductal carcinoma in situ,  intermediate grade, estrogen receptor  100% and progesterone receptor 100%  positive.   -Bilateral breast MRI was obtained  03/26/2012. This showed only post  biopsy changes in the right breast,  associated with an area of non-masslike  enhancement measuring 2.4 cm. The  left breast was unremarkable, and there  was no enlarged axillary or internal  mammary adenopathy noted.  Accordingly on 04/09/2012 the patient  underwent right lumpectomy, the  pathology ([id number]) showing  ductal  carcinoma in situ measuring 2.0 cm,  grade 2, with negative margins, the  closest being 0.3 cm. The patient's  subsequent history is as detailed below.   PLAN: -Discussed pt labwork today, 02/10/20; blood counts and chemistries are nml, LDH is WNL -The pt has no prohibitive toxicities from continuing C13 Brentuximab at this time. Orders reviewed and signed.  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 -Advised pt that certain individuals would be eligible for transplant after radiographic remission, but that pt is not a candidate for this.  -Discussed considering a treatment holiday if labs, scans, and clinical symptoms remain stable and toxicities increase.  -Recommended that the pt continue to eat well, drink at least 48-64 oz of water each day, and walk 20-30 minutes each day.  -Advised again for pt to continue increasing her carbohydrate intake.  -Will repeat scans every 4-6 months  -Continue B-complex vitamin -Will see back in 3 weeks with labs   FOLLOW-UP: Plz schedule next 3 cycles of Brentuximab-Vedotin as per orders with portflush, labs and MD visit with each treatment   The total time spent in the appt was 30 minutes and more than 50% was on counseling and direct patient cares, ordering and management of chemotherapy.  All of the patient's questions were answered with apparent satisfaction. The patient knows to call the clinic with any problems, questions or concerns.    Sullivan Lone MD [place] AAHIVMS [facility] Hematology/Oncology Physician [facility]  (Office):       [phone] (Work cell):  [phone] (Fax):           [phone]  02/10/2020 10:50 AM   I, Yevette Edwards, am acting as a scribe for Dr. Sullivan Lone.   .I have reviewed the above documentation for accuracy and completeness, and I agree with the above. Brunetta Genera MD

## 2020-02-10 ENCOUNTER — Inpatient Hospital Stay: Payer: Medicare Other

## 2020-02-10 ENCOUNTER — Other Ambulatory Visit: Payer: Self-pay | Admitting: *Deleted

## 2020-02-10 ENCOUNTER — Inpatient Hospital Stay: Payer: Medicare Other | Attending: Hematology

## 2020-02-10 ENCOUNTER — Other Ambulatory Visit: Payer: Self-pay

## 2020-02-10 ENCOUNTER — Inpatient Hospital Stay (HOSPITAL_BASED_OUTPATIENT_CLINIC_OR_DEPARTMENT_OTHER): Payer: Medicare Other | Admitting: Hematology

## 2020-02-10 VITALS — BP 119/82 | HR 82 | Temp 97.2°F | Resp 18 | Ht 66.0 in | Wt 110.6 lb

## 2020-02-10 DIAGNOSIS — C8440 Peripheral T-cell lymphoma, not classified, unspecified site: Secondary | ICD-10-CM

## 2020-02-10 DIAGNOSIS — Z853 Personal history of malignant neoplasm of breast: Secondary | ICD-10-CM | POA: Diagnosis not present

## 2020-02-10 DIAGNOSIS — Z95828 Presence of other vascular implants and grafts: Secondary | ICD-10-CM

## 2020-02-10 DIAGNOSIS — Z5111 Encounter for antineoplastic chemotherapy: Secondary | ICD-10-CM | POA: Diagnosis not present

## 2020-02-10 DIAGNOSIS — Z5112 Encounter for antineoplastic immunotherapy: Secondary | ICD-10-CM | POA: Insufficient documentation

## 2020-02-10 DIAGNOSIS — I2699 Other pulmonary embolism without acute cor pulmonale: Secondary | ICD-10-CM | POA: Diagnosis not present

## 2020-02-10 DIAGNOSIS — C865 Angioimmunoblastic T-cell lymphoma: Secondary | ICD-10-CM | POA: Diagnosis not present

## 2020-02-10 DIAGNOSIS — Z7901 Long term (current) use of anticoagulants: Secondary | ICD-10-CM | POA: Diagnosis not present

## 2020-02-10 DIAGNOSIS — Z7189 Other specified counseling: Secondary | ICD-10-CM

## 2020-02-10 LAB — CBC WITH DIFFERENTIAL (CANCER CENTER ONLY)
Abs Immature Granulocytes: 0.01 10*3/uL (ref 0.00–0.07)
Basophils Absolute: 0 10*3/uL (ref 0.0–0.1)
Basophils Relative: 1 %
Eosinophils Absolute: 0.1 10*3/uL (ref 0.0–0.5)
Eosinophils Relative: 4 %
HCT: 40.5 % (ref 36.0–46.0)
Hemoglobin: 13.2 g/dL (ref 12.0–15.0)
Immature Granulocytes: 0 %
Lymphocytes Relative: 20 %
Lymphs Abs: 0.7 10*3/uL (ref 0.7–4.0)
MCH: 29.3 pg (ref 26.0–34.0)
MCHC: 32.6 g/dL (ref 30.0–36.0)
MCV: 90 fL (ref 80.0–100.0)
Monocytes Absolute: 0.5 10*3/uL (ref 0.1–1.0)
Monocytes Relative: 14 %
Neutro Abs: 2.1 10*3/uL (ref 1.7–7.7)
Neutrophils Relative %: 61 %
Platelet Count: 194 10*3/uL (ref 150–400)
RBC: 4.5 MIL/uL (ref 3.87–5.11)
RDW: 15.4 % (ref 11.5–15.5)
WBC Count: 3.4 10*3/uL — ABNORMAL LOW (ref 4.0–10.5)
nRBC: 0 % (ref 0.0–0.2)

## 2020-02-10 LAB — CMP (CANCER CENTER ONLY)
ALT: 14 U/L (ref 0–44)
AST: 16 U/L (ref 15–41)
Albumin: 3.9 g/dL (ref 3.5–5.0)
Alkaline Phosphatase: 68 U/L (ref 38–126)
Anion gap: 13 (ref 5–15)
BUN: 23 mg/dL (ref 8–23)
CO2: 23 mmol/L (ref 22–32)
Calcium: 9.6 mg/dL (ref 8.9–10.3)
Chloride: 106 mmol/L (ref 98–111)
Creatinine: 0.74 mg/dL (ref 0.44–1.00)
GFR, Est AFR Am: >60 mL/min (ref >60)
GFR, Estimated: >60 mL/min (ref >60)
Glucose, Bld: 97 mg/dL (ref 70–99)
Potassium: 3.6 mmol/L (ref 3.5–5.1)
Sodium: 142 mmol/L (ref 135–145)
Total Bilirubin: 0.6 mg/dL (ref 0.3–1.2)
Total Protein: 6.9 g/dL (ref 6.5–8.1)

## 2020-02-10 LAB — LACTATE DEHYDROGENASE: LDH: 190 U/L (ref 98–192)

## 2020-02-10 MED ORDER — HEPARIN SOD (PORK) LOCK FLUSH 100 UNIT/ML IV SOLN
500.0000 [IU] | Freq: Once | INTRAVENOUS | Status: AC | PRN
  Administered 2020-02-10: 500 [IU]
  Filled 2020-02-10: qty 5

## 2020-02-10 MED ORDER — SODIUM CHLORIDE 0.9% FLUSH
10.0000 mL | Freq: Once | INTRAVENOUS | Status: AC
  Administered 2020-02-10: 10 mL
  Filled 2020-02-10: qty 10

## 2020-02-10 MED ORDER — FAMOTIDINE IN NACL 20-0.9 MG/50ML-% IV SOLN
INTRAVENOUS | Status: AC
  Filled 2020-02-10: qty 50

## 2020-02-10 MED ORDER — ONDANSETRON HCL 4 MG/2ML IJ SOLN
4.0000 mg | Freq: Once | INTRAMUSCULAR | Status: AC
  Administered 2020-02-10: 4 mg via INTRAVENOUS

## 2020-02-10 MED ORDER — DIPHENHYDRAMINE HCL 50 MG/ML IJ SOLN
INTRAMUSCULAR | Status: AC
  Filled 2020-02-10: qty 1

## 2020-02-10 MED ORDER — ONDANSETRON HCL 4 MG/2ML IJ SOLN
INTRAMUSCULAR | Status: AC
  Filled 2020-02-10: qty 2

## 2020-02-10 MED ORDER — SODIUM CHLORIDE 0.9% FLUSH
10.0000 mL | INTRAVENOUS | Status: DC | PRN
  Administered 2020-02-10: 10 mL
  Filled 2020-02-10: qty 10

## 2020-02-10 MED ORDER — DEXAMETHASONE SODIUM PHOSPHATE 10 MG/ML IJ SOLN
5.0000 mg | Freq: Once | INTRAMUSCULAR | Status: AC
  Administered 2020-02-10: 5 mg via INTRAVENOUS

## 2020-02-10 MED ORDER — DIPHENHYDRAMINE HCL 50 MG/ML IJ SOLN
50.0000 mg | Freq: Once | INTRAMUSCULAR | Status: AC
  Administered 2020-02-10: 50 mg via INTRAVENOUS

## 2020-02-10 MED ORDER — SODIUM CHLORIDE 0.9 % IV SOLN
Freq: Once | INTRAVENOUS | Status: AC
  Filled 2020-02-10: qty 250

## 2020-02-10 MED ORDER — DEXAMETHASONE SODIUM PHOSPHATE 10 MG/ML IJ SOLN
INTRAMUSCULAR | Status: AC
  Filled 2020-02-10: qty 1

## 2020-02-10 MED ORDER — SODIUM CHLORIDE 0.9 % IV SOLN
1.8000 mg/kg | Freq: Once | INTRAVENOUS | Status: AC
  Administered 2020-02-10: 100 mg via INTRAVENOUS
  Filled 2020-02-10: qty 20

## 2020-02-10 MED ORDER — FAMOTIDINE IN NACL 20-0.9 MG/50ML-% IV SOLN
20.0000 mg | Freq: Once | INTRAVENOUS | Status: AC
  Administered 2020-02-10: 20 mg via INTRAVENOUS

## 2020-02-10 MED ORDER — ACETAMINOPHEN 325 MG PO TABS
650.0000 mg | ORAL_TABLET | Freq: Once | ORAL | Status: AC
  Administered 2020-02-10: 650 mg via ORAL

## 2020-02-10 MED ORDER — ACETAMINOPHEN 325 MG PO TABS
ORAL_TABLET | ORAL | Status: AC
  Filled 2020-02-10: qty 2

## 2020-02-10 NOTE — Patient Instructions (Signed)
[name] Discharge Instructions for Patients Receiving Chemotherapy  Today you received the following chemotherapy agents: brentuximab vedotin  To help prevent nausea and vomiting after your treatment, we encourage you to take your nausea medication as directed.    If you develop nausea and vomiting that is not controlled by your nausea medication, call the clinic.   BELOW ARE SYMPTOMS THAT SHOULD BE REPORTED IMMEDIATELY:  *FEVER GREATER THAN 100.5 F  *CHILLS WITH OR WITHOUT FEVER  NAUSEA AND VOMITING THAT IS NOT CONTROLLED WITH YOUR NAUSEA MEDICATION  *UNUSUAL SHORTNESS OF BREATH  *UNUSUAL BRUISING OR BLEEDING  TENDERNESS IN MOUTH AND THROAT WITH OR WITHOUT PRESENCE OF ULCERS  *URINARY PROBLEMS  *BOWEL PROBLEMS  UNUSUAL RASH Items with * indicate a potential emergency and should be followed up as soon as possible.  Feel free to call the clinic should you have any questions or concerns. The clinic phone number is [phone].  Please show the [place] at check-in to the Emergency Department and triage nurse.

## 2020-02-13 DIAGNOSIS — D2262 Melanocytic nevi of left upper limb, including shoulder: Secondary | ICD-10-CM | POA: Diagnosis not present

## 2020-02-13 DIAGNOSIS — L817 Pigmented purpuric dermatosis: Secondary | ICD-10-CM | POA: Diagnosis not present

## 2020-02-13 DIAGNOSIS — L821 Other seborrheic keratosis: Secondary | ICD-10-CM | POA: Diagnosis not present

## 2020-02-13 DIAGNOSIS — D225 Melanocytic nevi of trunk: Secondary | ICD-10-CM | POA: Diagnosis not present

## 2020-02-13 DIAGNOSIS — D692 Other nonthrombocytopenic purpura: Secondary | ICD-10-CM | POA: Diagnosis not present

## 2020-02-17 DIAGNOSIS — Z23 Encounter for immunization: Secondary | ICD-10-CM | POA: Diagnosis not present

## 2020-02-20 ENCOUNTER — Telehealth: Payer: Self-pay

## 2020-02-20 NOTE — Telephone Encounter (Signed)
Received call from pt's. daughter [name] regarding pt's. upcoming appts. scheduled for Monday 02/20/20 instead of the usual Tuesdays. She wanted them moved to Tuesday. Contacted scheduling to change appts. but there were no openings. TCT to [facility] and explained no openings on Tuesday 03/02/20 to accommodate the appts. and assured her that the next appts. in November are on Tuesdays. She was agreeable and verbalized understanding.

## 2020-02-26 ENCOUNTER — Telehealth: Payer: Self-pay | Admitting: Hematology

## 2020-02-26 NOTE — Telephone Encounter (Signed)
Scheduled per los, spoke with patient's daughter and patient will be notified. Of upcoming appointment.

## 2020-03-01 ENCOUNTER — Other Ambulatory Visit: Payer: Self-pay

## 2020-03-01 ENCOUNTER — Other Ambulatory Visit: Payer: Medicare Other

## 2020-03-01 ENCOUNTER — Inpatient Hospital Stay: Payer: Medicare Other | Attending: Hematology

## 2020-03-01 ENCOUNTER — Inpatient Hospital Stay (HOSPITAL_BASED_OUTPATIENT_CLINIC_OR_DEPARTMENT_OTHER): Payer: Medicare Other | Admitting: Hematology

## 2020-03-01 ENCOUNTER — Inpatient Hospital Stay: Payer: Medicare Other

## 2020-03-01 ENCOUNTER — Other Ambulatory Visit: Payer: Self-pay | Admitting: *Deleted

## 2020-03-01 VITALS — BP 128/84 | HR 70 | Temp 97.5°F | Resp 18 | Ht 66.0 in | Wt 112.5 lb

## 2020-03-01 DIAGNOSIS — C8440 Peripheral T-cell lymphoma, not classified, unspecified site: Secondary | ICD-10-CM

## 2020-03-01 DIAGNOSIS — Z95828 Presence of other vascular implants and grafts: Secondary | ICD-10-CM | POA: Diagnosis not present

## 2020-03-01 DIAGNOSIS — C865 Angioimmunoblastic T-cell lymphoma: Secondary | ICD-10-CM | POA: Insufficient documentation

## 2020-03-01 DIAGNOSIS — Z5112 Encounter for antineoplastic immunotherapy: Secondary | ICD-10-CM | POA: Insufficient documentation

## 2020-03-01 DIAGNOSIS — Z7189 Other specified counseling: Secondary | ICD-10-CM

## 2020-03-01 DIAGNOSIS — Z853 Personal history of malignant neoplasm of breast: Secondary | ICD-10-CM | POA: Diagnosis not present

## 2020-03-01 LAB — CMP (CANCER CENTER ONLY)
ALT: 12 U/L (ref 0–44)
AST: 16 U/L (ref 15–41)
Albumin: 4 g/dL (ref 3.5–5.0)
Alkaline Phosphatase: 70 U/L (ref 38–126)
Anion gap: 6 (ref 5–15)
BUN: 17 mg/dL (ref 8–23)
CO2: 29 mmol/L (ref 22–32)
Calcium: 9.8 mg/dL (ref 8.9–10.3)
Chloride: 105 mmol/L (ref 98–111)
Creatinine: 0.66 mg/dL (ref 0.44–1.00)
GFR, Estimated: >60 mL/min (ref >60)
Glucose, Bld: 81 mg/dL (ref 70–99)
Potassium: 3.8 mmol/L (ref 3.5–5.1)
Sodium: 140 mmol/L (ref 135–145)
Total Bilirubin: 0.6 mg/dL (ref 0.3–1.2)
Total Protein: 7 g/dL (ref 6.5–8.1)

## 2020-03-01 LAB — CBC WITH DIFFERENTIAL (CANCER CENTER ONLY)
Abs Immature Granulocytes: 0.01 10*3/uL (ref 0.00–0.07)
Basophils Absolute: 0 10*3/uL (ref 0.0–0.1)
Basophils Relative: 1 %
Eosinophils Absolute: 0.1 10*3/uL (ref 0.0–0.5)
Eosinophils Relative: 3 %
HCT: 41.8 % (ref 36.0–46.0)
Hemoglobin: 13.6 g/dL (ref 12.0–15.0)
Immature Granulocytes: 0 %
Lymphocytes Relative: 20 %
Lymphs Abs: 0.7 10*3/uL (ref 0.7–4.0)
MCH: 29.2 pg (ref 26.0–34.0)
MCHC: 32.5 g/dL (ref 30.0–36.0)
MCV: 89.7 fL (ref 80.0–100.0)
Monocytes Absolute: 0.4 10*3/uL (ref 0.1–1.0)
Monocytes Relative: 12 %
Neutro Abs: 2.2 10*3/uL (ref 1.7–7.7)
Neutrophils Relative %: 64 %
Platelet Count: 196 10*3/uL (ref 150–400)
RBC: 4.66 MIL/uL (ref 3.87–5.11)
RDW: 14.9 % (ref 11.5–15.5)
WBC Count: 3.4 10*3/uL — ABNORMAL LOW (ref 4.0–10.5)
nRBC: 0 % (ref 0.0–0.2)

## 2020-03-01 LAB — LACTATE DEHYDROGENASE: LDH: 199 U/L — ABNORMAL HIGH (ref 98–192)

## 2020-03-01 MED ORDER — DIPHENHYDRAMINE HCL 25 MG PO CAPS
ORAL_CAPSULE | ORAL | Status: AC
  Filled 2020-03-01: qty 1

## 2020-03-01 MED ORDER — SODIUM CHLORIDE 0.9 % IV SOLN
1.8000 mg/kg | Freq: Once | INTRAVENOUS | Status: AC
  Administered 2020-03-01: 100 mg via INTRAVENOUS
  Filled 2020-03-01: qty 20

## 2020-03-01 MED ORDER — DEXAMETHASONE SODIUM PHOSPHATE 10 MG/ML IJ SOLN
5.0000 mg | Freq: Once | INTRAMUSCULAR | Status: AC
  Administered 2020-03-01: 5 mg via INTRAVENOUS

## 2020-03-01 MED ORDER — SODIUM CHLORIDE 0.9% FLUSH
10.0000 mL | INTRAVENOUS | Status: DC | PRN
  Administered 2020-03-01: 10 mL
  Filled 2020-03-01: qty 10

## 2020-03-01 MED ORDER — SODIUM CHLORIDE 0.9 % IV SOLN
Freq: Once | INTRAVENOUS | Status: AC
  Filled 2020-03-01: qty 250

## 2020-03-01 MED ORDER — ONDANSETRON HCL 4 MG/2ML IJ SOLN
INTRAMUSCULAR | Status: AC
  Filled 2020-03-01: qty 2

## 2020-03-01 MED ORDER — FAMOTIDINE IN NACL 20-0.9 MG/50ML-% IV SOLN
20.0000 mg | Freq: Once | INTRAVENOUS | Status: AC
  Administered 2020-03-01: 20 mg via INTRAVENOUS

## 2020-03-01 MED ORDER — ACETAMINOPHEN 325 MG PO TABS
ORAL_TABLET | ORAL | Status: AC
  Filled 2020-03-01: qty 2

## 2020-03-01 MED ORDER — FAMOTIDINE IN NACL 20-0.9 MG/50ML-% IV SOLN
INTRAVENOUS | Status: AC
  Filled 2020-03-01: qty 50

## 2020-03-01 MED ORDER — SODIUM CHLORIDE 0.9% FLUSH
10.0000 mL | Freq: Once | INTRAVENOUS | Status: AC
  Administered 2020-03-01: 10 mL
  Filled 2020-03-01: qty 10

## 2020-03-01 MED ORDER — DIPHENHYDRAMINE HCL 50 MG/ML IJ SOLN
INTRAMUSCULAR | Status: AC
  Filled 2020-03-01: qty 1

## 2020-03-01 MED ORDER — HEPARIN SOD (PORK) LOCK FLUSH 100 UNIT/ML IV SOLN
500.0000 [IU] | Freq: Once | INTRAVENOUS | Status: AC | PRN
  Administered 2020-03-01: 500 [IU]
  Filled 2020-03-01: qty 5

## 2020-03-01 MED ORDER — DIPHENHYDRAMINE HCL 50 MG/ML IJ SOLN
50.0000 mg | Freq: Once | INTRAMUSCULAR | Status: AC
  Administered 2020-03-01: 50 mg via INTRAVENOUS

## 2020-03-01 MED ORDER — ONDANSETRON HCL 4 MG/2ML IJ SOLN
4.0000 mg | Freq: Once | INTRAMUSCULAR | Status: AC
  Administered 2020-03-01: 4 mg via INTRAVENOUS

## 2020-03-01 MED ORDER — DEXAMETHASONE SODIUM PHOSPHATE 10 MG/ML IJ SOLN
INTRAMUSCULAR | Status: AC
  Filled 2020-03-01: qty 1

## 2020-03-01 MED ORDER — ACETAMINOPHEN 325 MG PO TABS
650.0000 mg | ORAL_TABLET | Freq: Once | ORAL | Status: AC
  Administered 2020-03-01: 650 mg via ORAL

## 2020-03-01 NOTE — Patient Instructions (Signed)

## 2020-03-01 NOTE — Progress Notes (Signed)
HEMATOLOGY/ONCOLOGY CLINIC NOTE  Date of Service: 03/01/2020  Patient Care Team: Lajean Manes, MD as PCP - General (Internal Medicine) Buford Dresser, MD as PCP - Cardiology (Cardiology)  REFERRING PHYSICIAN: Lajean Manes, MD  CHIEF COMPLAINTS/PURPOSE OF CONSULTATION:  Continue mx of Recently Diagnosed Angioimmunoblastic T cell lymphoma      HISTORY OF PRESENTING ILLNESS:   Sarah [name] is a wonderful 86 y.o. female who has been referred to [place] by Lajean Manes, MD for evaluation and management of Possible lymphoma . She is accompanied today by [name] . The pt reports that she is doing well overall.   Pending Surgical pathology from 03/17/2019 Dr.Manny will contact clinic about pathology   August 25th/27th she began coughing and sneezing. On Sept 3 went to doctors and they said it was allergies. She began having rashes (little red dots). Went to PCP and they prescribed prednisone and for several days and the medication helped. But as soon as she completed the prescription symptoms reoccurred.   She had a chest X Ray done because she was having a severe cough on 02/17/2019 revealing "bilateral effusions in this patient with history of breast cancer,of uncertain significance with question of right lower lobe pulmonary nodule, consider chest CT for further assessment. Atherosclerotic changes in the thoracic aorta."  Dr. Amy Martinique biopsied the rashes on her back 02/21/2019 and it was determined that it could be because of an allergy to medication. Dr.Stoneking stopped her blood pressure medication from September- October but ther rash still present. The itching started 1 to 2 months ago. The rash would come and go and was mostly on her back. She still has rash on her left thigh.  She is using triaconazole and cetrizine.  She has lumps behind ear and abdomin and she states that they have swollen twice the size within 1 and half months  She has no appetite and  has changes in her taste that began around August. She has changes in bowels. They are more loose and smaller In size. She also has no control over bowels.  She feels fatigued and it has increased since August.   She has a cough that starts in September  that has made her hoarse.   She had a CT scan on 02/27/2019 because she was having severe cough and her stomach was very extended. Revealing "1. Extensive lymphadenopathy throughout the chest, abdomen, and pelvis, with index lymph nodes identified above. Splenomegaly. Findings are most consistent with lymphoma with recurrent, metastatic breast malignancy less favored. 2. There is generally osteopenic appearance of the skeleton. There are numerous subtle hypodense lesions, particularly of the pelvis (e.g. Series 2, image 88) and select vertebral bodies, for example L4 (series 8, image 92). This is suspicious although not definitive for osseous metastatic disease. Characterization for metabolic activity by nuclear scintigraphic bone scan or PET-CT would be helpful to further evaluate. 3. Moderate right, small left pleural effusions with associated atelectasis or consolidation. Subpleural radiation fibrosis of the anterior right lung. 4. There is a new subpleural pulmonary nodule of the anterior right middle lobe measuring 6 mm (series 4, image 105), nonspecific. There is no obvious pleural thickening or nodularity definitive for metastatic disease. 5.  Small volume ascites. 6. There is a fluid attenuation lesion of the pancreatic uncinate measuring 2.4 x 1.3 cm (series 2, image 63), not substantially changed when compared to remote prior MR examination dated 02/23/2012 and likely sequelae of a prior pseudocyst versus incidental pancreatic IPMN. 7.  Cardiomegaly."  Hospitalized from 03/06/2019-03/08/2019. She presenting with acute shortness of breath, leg swelling,left hand swelling and some weight gain despite poor appetite. In ED, CTA chest revealed at least  submassive PE, moderate right pleural effusion, diffuse LAD concerning for lymphoma. Started on heparin drip. PCCM consulted for guidance. She had thoracocentesis with removal of 1200 cc cloudy exudative pleural fluid. Cardiology consulted for elevated which was thought to be demand ischemia from right heart strain in the setting of PE. Patient was transitioned to subcu Lovenox by pulmonology the next day and remained stable. Unusual appearance of tracheal air column at the thoracic inlet. Correlate with any history of swallowing difficulty or changes in voice with dedicated imaging with chest CT and or CT of the neck as indicated.  She has a nonhealing ulcer on her right tibialis anterior that starting weeping one week ago. Of note since the patient's last visit, pt has had CHEST XRAY - 2 VIEW (Accession [id number]) completed on 02/17/2019 with results revealing "Bilateral effusions in this patient with history of breast cancer,of uncertain significance with question of right lower lobepulmonary nodule, consider chest CT for furtherassessment.Unusual appearance of tracheal air column at the thoracic inlet.Correlate with any history of swallowing difficulty or changes in voice with dedicated imaging with chest CT and or CT of the neck as Indicated. Atherosclerotic changes in the thoracic aorta."  Of note since the patient's last visit, pt has had CT CHEST, ABDOMEN, AND PELVIS WITH CONTRAST (Accession [id number]) completed on 02/27/2019 with results revealing "1. Extensive lymphadenopathy throughout the chest, abdomen, and pelvis, with index lymph nodes identified above. Splenomegaly.Findings are most consistent with lymphoma with recurrent, metastatic breast malignancy less favored. 2. There is generally osteopenic appearance of the skeleton. There are numerous subtle hypodense lesions, particularly of the pelvis (e.g. Series 2, image 88) and select vertebral bodies, for example L4 (series 8, image 92).  This is suspicious although not definitive for osseous metastatic disease. Characterization for metabolic activity by nuclear scintigraphic bone scan or PET-CT would be helpful to further evaluate.  3. Moderate right, small left pleural effusions with associated atelectasis or consolidation. Subpleural radiation fibrosis of the anterior right lung.4. There is a new subpleural pulmonary nodule of the anterior right middle lobe measuring 6 mm (series 4, image 105), nonspecific. There is no obvious pleural thickening or nodularity definitive for metastatic disease.5.  Small volume ascites. 6. There is a fluid attenuation lesion of the pancreatic uncinate measuring 2.4 x 1.3 cm (series 2, image 63), not substantially changed when compared to remote prior MR examination dated 02/23/2012 and likely sequelae of a prior pseudocyst versus incidental pancreatic IPMN.7.  Cardiomegaly.8. Other chronic and incidental findings as detailed above. Aortic Atherosclerosis (ICD10-I70.0)."  Of note since the patient's last visit, pt has had PORTABLE CHEST 1 VIEW (Accession [id number]) completed on 03/06/2019 with results revealing "1. Moderate size right and small left pleural effusions appear not significantly changed since 02/27/19. 2. No new cardiopulmonary abnormality."  Of note since the patient's last visit, pt has had ECHOCARDIOGRAM  completed on 03/06/2019 with results revealing  "1. Left ventricular ejection fraction, by visual estimation, is 60 to 65%. The left ventricle has normal function. Normal left ventricular size. There is no left ventricular hypertrophy. 2. Left ventricular diastolic Doppler parameters are consistent with impaired relaxation pattern of LV diastolic filling. 3. Global right ventricle has mildly reduced systolic function.The right ventricular size is moderately enlarged. No increase in right ventricular wall thickness. 4. Left atrial size was normal.  5. Right atrial size was mildly dilated.  6. Trivial pericardial effusion is present. 7. The mitral valve is normal in structure. Trace mitral valve regurgitation. No evidence of mitral stenosis. 8. The tricuspid valve is normal in structure. Tricuspid valve regurgitation severe. 9. The aortic valve is normal in structure. Aortic valve regurgitation was not visualized by color flow Doppler. Structurally normal aortic valve, with no evidence of sclerosis or stenosis.10. The pulmonic valve was normal in structure. Pulmonic valve regurgitation is mild by color flow Doppler.11. Mildly elevated pulmonary artery systolic pressure.12. The inferior vena cava is normal in size with greater than 50% respiratory variability, suggesting right atrial pressure of 3 mmHg."  Of note since the patient's last visit, pt has had CT ANGIOGRAPHY CHEST WITH CONTRAST (Accession [id number]) completed on 03/06/2019 with results revealing "1. Pulmonary embolus arising from the distal left main pulmonary artery with extension into multiple left lower lobe pulmonary arterial branches. There is also incompletely obstructing pulmonary embolus in the proximal left upper lobe pulmonary artery. More distal pulmonary embolus in the right lower lobe pulmonary artery. Positive for acute PE with CT evidence of right heart strain (RV/LV Ratio = 1.6) consistent with at least submassive (intermediate risk) PE. The presence of right heart strain has been associated with an increased risk of morbidity and mortality. Please activate Code PE by paging [phone]. 2. Sizable pleural effusion on the right with smaller pleural effusion on the left. Consolidation and compressive atelectasis throughout most of the right lower lobe. Milder atelectasis left base. Nodular opacities on the right, likely metastatic foci.3.  Stable adenopathy compared to 1 week prior.4. Enlargement of spleen, incompletely visualized but documented CT 1 week prior. Concern for potential lymphoma. 5. Aortic atherosclerosis.  No aneurysm or dissection evident. Foci of coronary artery calcification noted."  Most recent lab results (03/06/2019) of CBC is as follows: all values are WNL except for Platelets at 72, Eosinophils Absolute at 0.6,Abs Immature Granulocytes at 0.08 .  On review of systems, pt reports rashes, digestive issues and denies back pain, abdominal pain and any other symptoms.   On PMHx the pt reports Hypothyroidism, MCI, Hypercholesterolemia, Tracheal anomaly, Hypertension, Pulmonary nodule, Atherosclerosis of aorta, anxiety, gait disorder, primary insomnia.   INTERVAL HISTORY:  Sarah [name] is a 86 y.o. female here for evaluation and management of Angioimmunoblastic T-cell lymphoma. We are joined today by her daughter, [name]. The patient's last visit with [place] was on 02/10/2020. The pt reports that she is doing well overall.  The pt reports that she is noticing an increase in anxiety, but does have some improvement with Ativan. She is currently taking one per day. Pt is continuing to get some activity daily. She reports a change in her voice since her last Pneumonia.  Lab results today (03/01/20) of CBC w/diff and CMP is as follows: all values are WNL except for WBC at 3.4K. 03/01/2020 LDH at 199  On review of systems, pt reports anxiety, vocal changes and denies fevers, chills, cough, abdominal pain, diarrhea, constipation, rash, SOB, new leg swelling, tingling/numbness in hands/feet and any other symptoms.   MEDICAL HISTORY:  Past Medical History:  Diagnosis Date  . Allergy    codeine, thiazides  . Anxiety    new dx  . Arthritis   . Breast cancer ([place]) 03/14/12   bx=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,ER/PR=+,upper inner quad  . Cancer ([place])    breast  . Cataract   . DVT (deep venous thrombosis) ([facility]) 02/2019   left  leg  . Dyspnea   . Edema    both legs feet and toe, abdomen  . Glaucoma    laser treated years ago  . HOH (hard of hearing)   . Hyperlipemia   .  Hypertension   . Hypothyroidism   . Pancreatic cyst    benign  . PONV (postoperative nausea and vomiting)   . Pulmonary embolism ([place]) 02/2019   bilateral   . Radiation 06/11/2012-07/12/2012   17 sessions 4250 cGy, 3 sessions 750 cGy  . Vertigo   . Wears glasses   . Wears partial dentures    partial upper     SURGICAL HISTORY: Past Surgical History:  Procedure Laterality Date  . ABDOMINAL HYSTERECTOMY  1966   1/2 ovary left in   . BREAST SURGERY  1992   lumpectomy-lt  . CATARACT EXTRACTION     b/l  . COLONOSCOPY    . EXCISION MASS NECK Left 03/17/2019    EXCISION MASS NECK (Left Neck)  . EXCISION MASS NECK Left 03/17/2019   Procedure: EXCISION MASS NECK;  Surgeon: Helayne Seminole, MD;  Location: [facility];  Service: ENT;  Laterality: Left;  . EYE SURGERY Bilateral    bilateral cataract removal  . IR IMAGING GUIDED PORT INSERTION  04/23/2019  . JOINT REPLACEMENT  2013   rt total knee  . JOINT REPLACEMENT  1995   lt total knee  . PARTIAL MASTECTOMY WITH NEEDLE LOCALIZATION  04/09/2012   Procedure: PARTIAL MASTECTOMY WITH NEEDLE LOCALIZATION;  Surgeon: Adin Hector, MD;  Location: [facility];  Service: General;  Laterality: Right;  . SKIN BIOPSY Left 03/17/2019   LEFT THIGH  . SKIN BIOPSY Left 03/17/2019   Procedure: Skin Biopsy Left Thigh;  Surgeon: Helayne Seminole, MD;  Location: [facility];  Service: ENT;  Laterality: Left;  . TONSILLECTOMY    . TOTAL KNEE ARTHROPLASTY  08/16/2011   Procedure: TOTAL KNEE ARTHROPLASTY;  Surgeon: Ninetta Lights, MD;  Location: [facility];  Service: Orthopedics;  Laterality: Right;     SOCIAL HISTORY: Social History   Socioeconomic History  . Marital status: Widowed    Spouse name: Not on file  . Number of children: 2  . Years of education: Not on file  . Highest education level: Not on file  Occupational History  . Occupation: Retired    Comment: Community education officer   Tobacco Use  . Smoking status: Never Smoker    . Smokeless tobacco: Never Used  Vaping Use  . Vaping Use: Never used  Substance and Sexual Activity  . Alcohol use: No  . Drug use: No  . Sexual activity: Not Currently  Other Topics Concern  . Not on file  Social History Narrative  . Not on file   Social Determinants of Health   Financial Resource Strain:   . Difficulty of Paying Living Expenses: Not on file  Food Insecurity:   . Worried About Charity fundraiser in the Last Year: Not on file  . Ran Out of Food in the Last Year: Not on file  Transportation Needs:   . Lack of Transportation (Medical): Not on file  . Lack of Transportation (Non-Medical): Not on file  Physical Activity:   . Days of Exercise per Week: Not on file  . Minutes of Exercise per Session: Not on file  Stress:   . Feeling of Stress : Not on file  Social Connections:   . Frequency of Communication with Friends and Family:  Not on file  . Frequency of Social Gatherings with Friends and Family: Not on file  . Attends Religious Services: Not on file  . Active Member of Clubs or Organizations: Not on file  . Attends Archivist Meetings: Not on file  . Marital Status: Not on file  Intimate Partner Violence:   . Fear of Current or Ex-Partner: Not on file  . Emotionally Abused: Not on file  . Physically Abused: Not on file  . Sexually Abused: Not on file     FAMILY HISTORY: Family History  Problem Relation Age of Onset  . Heart disease Father   . Cancer Maternal Aunt        stomach     ALLERGIES:   is allergic to citalopram, codeine, latex, and thiazide-type diuretics.   MEDICATIONS:  Current Outpatient Medications  Medication Sig Dispense Refill  . amLODipine (NORVASC) 2.5 MG tablet Take 2.5-5 mg by mouth See admin instructions. 5 mg in the morning, 2.5 mg in the evening    . amoxicillin (AMOXIL) 500 MG capsule Take 4 capsules (2,000 mg total) by mouth once as needed for up to 1 dose. 60 mins prior to dental procedure 4 capsule 0   . apixaban (ELIQUIS) 5 MG TABS tablet Take 1 tablet (5 mg total) by mouth 2 (two) times daily. 180 tablet 1  . benzonatate (TESSALON) 100 MG capsule Take 1 capsule (100 mg total) by mouth 3 (three) times daily as needed for cough. 30 capsule 0  . Cholecalciferol (VITAMIN D) 50 MCG (2000 UT) tablet Take 2,000 Units by mouth 2 (two) times daily.    . Cyanocobalamin (VITAMIN B-12 PO) Take 3,000 mcg by mouth daily.     Marland Kitchen levothyroxine (SYNTHROID) 25 MCG tablet Take 25 mcg by mouth daily before breakfast.     . lidocaine-prilocaine (EMLA) cream Apply to affected area once 30 g 3  . LORazepam (ATIVAN) 0.5 MG tablet TAKE 1/2 (ONE-HALF) TO 1 TABLET BY MOUTH EVERY 8 HOURS 30 tablet 0  . Multiple Vitamins-Minerals (PRESERVISION AREDS PO) Take 1 capsule by mouth 2 (two) times daily.     . ondansetron (ZOFRAN) 8 MG tablet Take 1 tablet (8 mg total) by mouth 2 (two) times daily as needed. Start on the third day after chemotherapy. 30 tablet 1  . polyethylene glycol (MIRALAX / GLYCOLAX) 17 g packet Take 17 g by mouth daily.    . prochlorperazine (COMPAZINE) 10 MG tablet Take 1 tablet (10 mg total) by mouth every 6 (six) hours as needed (Nausea or vomiting). 30 tablet 1  . sodium chloride (MURO 128) 5 % ophthalmic ointment Place 1 application into both eyes at bedtime.    Marland Kitchen XIIDRA 5 % SOLN Place 1 drop into both eyes daily as needed (dry eyes).     Marland Kitchen zolpidem (AMBIEN) 5 MG tablet Take 5 mg by mouth at bedtime as needed for sleep.      No current facility-administered medications for this visit.   Facility-Administered Medications Ordered in Other Visits  Medication Dose Route Frequency Provider Last Rate Last Admin  . brentuximab vedotin (ADCETRIS) 100 mg in sodium chloride 0.9 % 100 mL chemo infusion  1.8 mg/kg (Order-Specific) Intravenous Once Brunetta Genera, MD      . heparin lock flush 100 unit/mL  500 Units Intracatheter Once PRN Brunetta Genera, MD      . sodium chloride flush (NS) 0.9 %  injection 10 mL  10 mL Intracatheter PRN Brunetta Genera,  MD         REVIEW OF SYSTEMS:   A 10+ POINT REVIEW OF SYSTEMS WAS OBTAINED including neurology, dermatology, psychiatry, cardiac, respiratory, lymph, extremities, GI, GU, Musculoskeletal, constitutional, breasts, reproductive, HEENT.  All pertinent positives are noted in the HPI.  All others are negative.   PHYSICAL EXAMINATION: ECOG FS:1 - Symptomatic but completely ambulatory  Vitals:   03/01/20 1043  BP: 128/84  Pulse: 70  Resp: 18  Temp: (!) 97.5 F (36.4 C)  SpO2: 100%   Wt Readings from Last 3 Encounters:  03/01/20 112 lb 8 oz (51 kg)  02/10/20 110 lb 9.6 oz (50.2 kg)  01/20/20 110 lb 12 oz (50.2 kg)   Body mass index is 18.16 kg/m.    GENERAL:alert, in no acute distress and comfortable SKIN: no acute rashes, no significant lesions EYES: conjunctiva are pink and non-injected, sclera anicteric OROPHARYNX: MMM, no exudates, no oropharyngeal erythema or ulceration NECK: supple, no JVD LYMPH:  no palpable lymphadenopathy in the cervical, axillary or inguinal regions LUNGS: clear to auscultation b/l with normal respiratory effort HEART: regular rate & rhythm ABDOMEN:  normoactive bowel sounds , non tender, not distended. No palpable hepatosplenomegaly.  Extremity: no pedal edema PSYCH: alert & oriented x 3 with fluent speech NEURO: no focal motor/sensory deficits  LABORATORY DATA:  I have reviewed the data as listed  CBC Latest Ref Rng & Units 03/01/2020 02/10/2020 01/20/2020  WBC 4.0 - 10.5 K/uL 3.4(L) 3.4(L) 4.2  Hemoglobin 12.0 - 15.0 g/dL 13.6 13.2 12.9  Hematocrit 36 - 46 % 41.8 40.5 39.9  Platelets 150 - 400 K/uL 196 194 258    CMP Latest Ref Rng & Units 03/01/2020 02/10/2020 01/20/2020  Glucose 70 - 99 mg/dL 81 97 88  BUN 8 - 23 mg/dL 17 23 17   Creatinine 0.44 - 1.00 mg/dL 0.66 0.74 0.64  Sodium 135 - 145 mmol/L 140 142 141  Potassium 3.5 - 5.1 mmol/L 3.8 3.6 3.9  Chloride 98 - 111 mmol/L 105  106 104  CO2 22 - 32 mmol/L 29 23 29   Calcium 8.9 - 10.3 mg/dL 9.8 9.6 10.6(H)  Total Protein 6.5 - 8.1 g/dL 7.0 6.9 6.9  Total Bilirubin 0.3 - 1.2 mg/dL 0.6 0.6 0.4  Alkaline Phos 38 - 126 U/L 70 68 71  AST 15 - 41 U/L 16 16 14(L)  ALT 0 - 44 U/L 12 14 9    Component     Latest Ref Rng & Units 03/27/2019  Hepatitis B Surface Ag     NON REACTIVE NON REACTIVE  Hep B Core Total Ab     NON REACTIVE NON REACTIVE  HCV Ab     NON REACTIVE NON REACTIVE  LDH     98 - 192 U/L 252 (H)   . Lab Results  Component Value Date   LDH 199 (H) 03/01/2020      04/08/2019 NM PET Image Initial (PI) Skull Base To Thigh (Accession [id number])  04/04/2019 PATHOLOGY   04/07/2019 ECHOCARDIOGRAM  03/28/2019 PATHOLOGY   03/06/2019 CT ANGIOGRAPHY CHEST WITH CONTRAST (Accession [id number])  04/07/2019 ECHOCARDIOGRAM  03/06/2019 ECHOCARDIOGRAM     03/06/2019 PORTABLE CHEST 1 VIEW (Accession [id number])    02/27/2019 CT CHEST, ABDOMEN, AND PELVIS WITH CONTRAST (Accession [id number])    02/17/2019 CHEST XRAY - 2 VIEW (Accession [id number])    RADIOGRAPHIC STUDIES: I have personally reviewed the radiological images as listed and agreed with the findings in the report. No results found.   ASSESSMENT & PLAN:  Sarah [name] is a 86 y.o. female with:  1. 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 SKIN, LEFT THIGH, BIOPSY: - Involvement by angioimmunoblastic T-cell lymphoma." -04/07/2019 Echocardiogram - normal EF -04/08/2019 NM PET Image Initial (PI) Skull Base To Thigh (Accession [id number]) revealed "1. Widespread hypermetabolic adenopathy in the neck, chest, and abdomen/pelvis, primarily in the Deauville 4 and Deauville 5 range. There is also hypermetabolic activity along the posterior nasopharynx in lingual tonsillar regions  potentially indicating sites of involvement. 2. The subtle hypodensities in the spine and bony pelvis are not appreciably hypermetabolic may be incidental, surveillance is suggested. 3. Bilateral thyroid activity but especially on the left. Probably from thyroiditis. 4. A 6 mm subpleural nodule anteriorly in the right lower lobe not appreciably hypermetabolic but is below sensitive PET-CT size thresholds and merits surveillance. 5. Mildly accentuated diffuse splenic activity without splenomegaly or focal splenic lesion identified. 6. Other imaging findings of potential clinical significance: Aortic Atherosclerosis (ICD10-I70.0). Coronary atherosclerosis. Large right and small left pleural effusions. Moderate cardiomegaly. Small pericardial effusion. Mild mesenteric edema. Large left renal cyst. Fullness of both renal collecting systems with suspected nonobstructive left nephrolithiasis. Degenerative grade 1 anterolisthesis at L4-5 at L5-S1. Trace pelvic ascites." -05/28/2019 PET/CT ([id number]) revealed "Complete metabolic response to therapy". -09/16/2019 PET/CT ([id number]) revealed "1. Unfortunately evidence of lymphoma recurrence at sites of previous hypermetabolic adenopathy. Hypermetabolic lymphoid tissue is new from comparison PET-CT 05/28/2019 but at same locations as PET-CT 04/08/2019. The number of metastatic lymph nodes is less than 04/08/2019. Lymphoid tissue at the RIGHT skull base is larger. 2. Three foci of hypermetabolic recurrence are present, lymphoid tissue at the RIGHT base of skull, hypermetabolic RIGHT hilar lymph node and hypermetabolic lymph node in the porta hepatis." -11/25/2019 PET/CT ([id number]) revealed "1. Marked interval improvement in the hypermetabolic disease identified previously in the posterior right nasopharynx, compatible with Deauville 4 today. 2. Decreased hypermetabolism in the right hilar and porta hepatis lymph nodes identified previously (Deauville 3). No new sites of  hypermetabolic disease in the chest, abdomen, or pelvis. 3. Similar diffuse thyroid uptake, left greater than right. 4. No substantial change in right-sided pulmonary nodules."  2. Pulmonary Embolism -extensive .likely related to extensive malignancy  3. H/o Breast Cancer  -The patient had bilateral diagnostic  mammography [facility] 07/11/2011. This  showed some calcifications in the right  breast, which seemed a little bit more  prominent than prior. In the left breast  there was an area of possible  architectural distortion. However left  breast ultrasound the same day showed  no abnormality. 6 month followup was  suggested, and on 02/28/2012 the  patient again had bilateral diagnostic  mammography, now with right  ultrasonography. The microcalcifications  in the right breast appeared increased.  Ultrasound showed a hypoechoic lesion  measuring 5 mm, with no associated  shadowing. This had been previously  noted and appeared unchanged. Anterior  to that there was a small hypoechoic  mass measuring 4 mm in diameter.  There was felt to be suspicious, and on  03/14/2012 the patient underwent biopsy  of the right breast mass, showing ([id number]  [id number]) ductal carcinoma in situ,  intermediate grade, estrogen receptor  100% and progesterone receptor 100%  positive.   -Bilateral breast MRI was obtained  03/26/2012. This showed only post  biopsy changes in the right breast,  associated with an area of non-masslike  enhancement measuring 2.4  cm. The  left breast was unremarkable, and there  was no enlarged axillary or internal  mammary adenopathy noted.  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 The patient's  subsequent history is as detailed below.   PLAN: -Discussed pt labwork today, 03/01/20; blood counts and chemistries are nml, LDH is borderline  elevated. -The pt has no prohibitive toxicities from continuing C14 Brentuximab at this time. Orders reviewed and signed. 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 -Discussed switching to Clonazepam for longer-acting anxiety control - pt prefers to continue Lorazepam for now.  -Recommend warm steam and/or salt/baking soda gargle for vocal changes. -Continued to recommend the addition of dietary carbohydrates.  -Continue B-complex vitamin -Will see back in 3 weeks with labs    FOLLOW-UP: RTC for next treatment , portflush, labs and MD visit as scheduled in 3 weeks   The total time spent in the appt was 20 minutes and more than 50% was on counseling and direct patient cares.  All of the patient's questions were answered with apparent satisfaction. The patient knows to call the clinic with any problems, questions or concerns.    Sullivan Lone MD [place] AAHIVMS [facility] Hematology/Oncology Physician [facility]  (Office):       [phone] (Work cell):  [phone] (Fax):           [phone]  03/01/2020 12:22 PM   I, Yevette Edwards, am acting as a scribe for Dr. Sullivan Lone.   .I have reviewed the above documentation for accuracy and completeness, and I agree with the above. Brunetta Genera MD

## 2020-03-01 NOTE — Patient Instructions (Signed)
[facility] Discharge Instructions for Patients Receiving Chemotherapy  Today you received the following chemotherapy agents brentuximab  To help prevent nausea and vomiting after your treatment, we encourage you to take your nausea medication as directed. If you develop nausea and vomiting that is not controlled by your nausea medication, call the clinic.   BELOW ARE SYMPTOMS THAT SHOULD BE REPORTED IMMEDIATELY:  *FEVER GREATER THAN 100.5 F  *CHILLS WITH OR WITHOUT FEVER  NAUSEA AND VOMITING THAT IS NOT CONTROLLED WITH YOUR NAUSEA MEDICATION  *UNUSUAL SHORTNESS OF BREATH  *UNUSUAL BRUISING OR BLEEDING  TENDERNESS IN MOUTH AND THROAT WITH OR WITHOUT PRESENCE OF ULCERS  *URINARY PROBLEMS  *BOWEL PROBLEMS  UNUSUAL RASH Items with * indicate a potential emergency and should be followed up as soon as possible.  Feel free to call the clinic should you have any questions or concerns. The clinic phone number is [phone].  Please show the [facility] at check-in to the Emergency Department and triage nurse.

## 2020-03-02 ENCOUNTER — Ambulatory Visit: Payer: Medicare Other | Admitting: Podiatry

## 2020-03-10 ENCOUNTER — Encounter: Payer: Self-pay | Admitting: Podiatry

## 2020-03-10 ENCOUNTER — Other Ambulatory Visit: Payer: Self-pay

## 2020-03-10 ENCOUNTER — Ambulatory Visit (INDEPENDENT_AMBULATORY_CARE_PROVIDER_SITE_OTHER): Payer: Medicare Other | Admitting: Podiatry

## 2020-03-10 ENCOUNTER — Other Ambulatory Visit: Payer: Self-pay | Admitting: Hematology

## 2020-03-10 DIAGNOSIS — M79674 Pain in right toe(s): Secondary | ICD-10-CM | POA: Diagnosis not present

## 2020-03-10 DIAGNOSIS — M79675 Pain in left toe(s): Secondary | ICD-10-CM

## 2020-03-10 DIAGNOSIS — B351 Tinea unguium: Secondary | ICD-10-CM | POA: Diagnosis not present

## 2020-03-10 DIAGNOSIS — Z23 Encounter for immunization: Secondary | ICD-10-CM | POA: Diagnosis not present

## 2020-03-10 DIAGNOSIS — M2042 Other hammer toe(s) (acquired), left foot: Secondary | ICD-10-CM

## 2020-03-10 DIAGNOSIS — M2041 Other hammer toe(s) (acquired), right foot: Secondary | ICD-10-CM

## 2020-03-10 NOTE — Progress Notes (Signed)
Subjective:  Patient ID: [name], female    DOB: August 02, 1932,  MRN: [id number]  86 y.o. female presents with painful thick toenails that are difficult to trim. Pain interferes with ambulation. Aggravating factors include wearing enclosed shoe gear. Pain is relieved with periodic professional debridement.   She is also on the blood thinner, Eliquis. She voices no new pedal problems on today's visit.    Review of Systems: Negative except as noted in the HPI. Denies N/V/F/Ch. Past Medical History:  Diagnosis Date  . Allergy    codeine, thiazides  . Anxiety    new dx  . Arthritis   . Breast cancer ([place]) 03/14/12   bx=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,ER/PR=+,upper inner quad  . Cancer ([name])    breast  . Cataract   . DVT (deep venous thrombosis) ([place]) 02/2019   left leg  . Dyspnea   . Edema    both legs feet and toe, abdomen  . Glaucoma    laser treated years ago  . HOH (hard of hearing)   . Hyperlipemia   . Hypertension   . Hypothyroidism   . Pancreatic cyst    benign  . PONV (postoperative nausea and vomiting)   . Pulmonary embolism ([place]) 02/2019   bilateral   . Radiation 06/11/2012-07/12/2012   17 sessions 4250 cGy, 3 sessions 750 cGy  . Vertigo   . Wears glasses   . Wears partial dentures    partial upper   Past Surgical History:  Procedure Laterality Date  . ABDOMINAL HYSTERECTOMY  1966   1/2 ovary left in   . BREAST SURGERY  1992   lumpectomy-lt  . CATARACT EXTRACTION     b/l  . COLONOSCOPY    . EXCISION MASS NECK Left 03/17/2019    EXCISION MASS NECK (Left Neck)  . EXCISION MASS NECK Left 03/17/2019   Procedure: EXCISION MASS NECK;  Surgeon: Helayne Seminole, MD;  Location: [facility];  Service: ENT;  Laterality: Left;  . EYE SURGERY Bilateral    bilateral cataract removal  . IR IMAGING GUIDED PORT INSERTION  04/23/2019  . JOINT REPLACEMENT  2013   rt total knee  . JOINT REPLACEMENT  1995   lt total knee  . PARTIAL  MASTECTOMY WITH NEEDLE LOCALIZATION  04/09/2012   Procedure: PARTIAL MASTECTOMY WITH NEEDLE LOCALIZATION;  Surgeon: Adin Hector, MD;  Location: [facility];  Service: General;  Laterality: Right;  . SKIN BIOPSY Left 03/17/2019   LEFT THIGH  . SKIN BIOPSY Left 03/17/2019   Procedure: Skin Biopsy Left Thigh;  Surgeon: Helayne Seminole, MD;  Location: [facility];  Service: ENT;  Laterality: Left;  . TONSILLECTOMY    . TOTAL KNEE ARTHROPLASTY  08/16/2011   Procedure: TOTAL KNEE ARTHROPLASTY;  Surgeon: Ninetta Lights, MD;  Location: [facility];  Service: Orthopedics;  Laterality: Right;   Patient Active Problem List   Diagnosis Date Noted  . Abnormal chest x-ray 12/31/2019  . Cough 12/31/2019  . Port-A-Cath in place 06/04/2019  . 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 Counseling regarding advance care planning and goals of care 04/14/2019  . Lymphadenopathy of head and neck 03/17/2019  . Lymph nodes enlarged 03/17/2019  . History of pulmonary embolus (PE) 03/17/2019  . Acute pulmonary embolism ([place]) 03/06/2019  . Diffuse lymphadenopathy 03/06/2019  . Pleural effusion 03/06/2019  . Pruritus 02/06/2019  . Pruritic rash 02/06/2019  . Angio-edema 02/06/2019  . Shortness of breath 02/06/2019  . Osteopenia 12/18/2013  .  Heart block AV first degree 12/18/2013  . Breast cancer of upper-inner quadrant of right female breast ([place]) 03/19/2012  . Right knee DJD 08/18/2011  . Hypothyroidism   . Hypertension   . PONV (postoperative nausea and vomiting)   . Hyperlipemia     Current Outpatient Medications:  .  amLODipine (NORVASC) 2.5 MG tablet, Take 2.5-5 mg by mouth See admin instructions. 5 mg in the morning, 2.5 mg in the evening, Disp: , Rfl:  .  amoxicillin (AMOXIL) 500 MG capsule, Take 4 capsules (2,000 mg total) by mouth once as needed for up to 1 dose. 60 mins prior to dental procedure, Disp: 4 capsule, Rfl: 0 .  apixaban (ELIQUIS) 5 MG TABS tablet, Take 1 tablet (5  mg total) by mouth 2 (two) times daily., Disp: 180 tablet, Rfl: 1 .  benzonatate (TESSALON) 100 MG capsule, Take 1 capsule (100 mg total) by mouth 3 (three) times daily as needed for cough., Disp: 30 capsule, Rfl: 0 .  Cholecalciferol (VITAMIN D) 50 MCG (2000 UT) tablet, Take 2,000 Units by mouth 2 (two) times daily., Disp: , Rfl:  .  Cyanocobalamin (VITAMIN B-12 PO), Take 3,000 mcg by mouth daily. , Disp: , Rfl:  .  levothyroxine (SYNTHROID) 25 MCG tablet, Take 25 mcg by mouth daily before breakfast. , Disp: , Rfl:  .  lidocaine-prilocaine (EMLA) cream, Apply to affected area once, Disp: 30 g, Rfl: 3 .  LORazepam (ATIVAN) 0.5 MG tablet, TAKE 1/2 TO 1 (ONE-HALF TO ONE) TABLET BY MOUTH EVERY 8 HOURS, Disp: 30 tablet, Rfl: 0 .  Multiple Vitamins-Minerals (PRESERVISION AREDS PO), Take 1 capsule by mouth 2 (two) times daily. , Disp: , Rfl:  .  ondansetron (ZOFRAN) 8 MG tablet, Take 1 tablet (8 mg total) by mouth 2 (two) times daily as needed. Start on the third day after chemotherapy., Disp: 30 tablet, Rfl: 1 .  polyethylene glycol (MIRALAX / GLYCOLAX) 17 g packet, Take 17 g by mouth daily., Disp: , Rfl:  .  prochlorperazine (COMPAZINE) 10 MG tablet, Take 1 tablet (10 mg total) by mouth every 6 (six) hours as needed (Nausea or vomiting)., Disp: 30 tablet, Rfl: 1 .  sodium chloride (MURO 128) 5 % ophthalmic ointment, Place 1 application into both eyes at bedtime., Disp: , Rfl:  .  XIIDRA 5 % SOLN, Place 1 drop into both eyes daily as needed (dry eyes). , Disp: , Rfl:  .  zolpidem (AMBIEN) 5 MG tablet, Take 5 mg by mouth at bedtime as needed for sleep. , Disp: , Rfl:  Allergies  Allergen Reactions  . Citalopram Anxiety  . Codeine Other (See Comments)    Reaction not recalled  . Latex   . Thiazide-Type Diuretics Other (See Comments)    Blurry vision?? (patient stated she has macular degeneration)   Social History   Tobacco Use  Smoking Status Never Smoker  Smokeless Tobacco Never Used     Objective:   [place] Ms. Cedotal is a pleasant 86 y.o. Caucasian female, WD, WN in NAD.Marland Kitchen  Vascular Neurovascular status unchanged b/l lower extremities. Capillary refill time to digits immediate b/l. Palpable pedal pulses b/l LE. Pedal hair absent. Lower extremity skin temperature gradient within normal limits. No pain with calf compression b/l. No cyanosis or clubbing noted.  Neurologic Normal speech. Oriented to person, place, and time. Protective sensation intact 5/5 intact bilaterally with 10g monofilament b/l. Vibratory sensation intact b/l. Clonus negative b/l.  Dermatologic Pedal skin with normal turgor, texture and tone bilaterally.  No open wounds bilaterally. No interdigital macerations bilaterally. Toenails 2-5 bilaterally and L hallux elongated, discolored, dystrophic, thickened, and crumbly with subungual debris and tenderness to dorsal palpation. Anonychia noted R hallux. Nailbed(s) epithelialized.   Orthopedic: Normal muscle strength 5/5 to all lower extremity muscle groups bilaterally. No pain crepitus or joint limitation noted with ROM b/l. Hammertoes noted to the 2-5 bilaterally.   Radiographs: None Assessment:   1. Pain due to onychomycosis of toenails of both feet   2. Hammer toes of both feet    Plan:  Patient was evaluated and treated and all questions answered.  Onychomycosis with pain -Nails palliatively debridement as below. -Educated on self-care  Procedure: Nail Debridement Rationale: Pain Type of Debridement: manual, sharp debridement. Instrumentation: Nail nipper, rotary burr. Number of Nails: 9 -Examined patient. -No new findings. No new orders. -Toenails 2-5 bilaterally and L hallux debrided in length and girth without iatrogenic bleeding with sterile nail nipper and dremel.  -Patient to report any pedal injuries to medical professional immediately. -Patient to continue soft, supportive shoe gear daily. -Patient/POA to call should there be  question/concern in the interim.  Return in about 3 months (around 06/10/2020).  Marzetta Board, DPM

## 2020-03-22 ENCOUNTER — Other Ambulatory Visit: Payer: Self-pay | Admitting: *Deleted

## 2020-03-22 DIAGNOSIS — C8440 Peripheral T-cell lymphoma, not classified, unspecified site: Secondary | ICD-10-CM

## 2020-03-23 ENCOUNTER — Other Ambulatory Visit: Payer: Self-pay

## 2020-03-23 ENCOUNTER — Inpatient Hospital Stay: Payer: Medicare Other

## 2020-03-23 ENCOUNTER — Inpatient Hospital Stay: Payer: Medicare Other | Attending: Hematology

## 2020-03-23 ENCOUNTER — Inpatient Hospital Stay (HOSPITAL_BASED_OUTPATIENT_CLINIC_OR_DEPARTMENT_OTHER): Payer: Medicare Other | Admitting: Hematology

## 2020-03-23 VITALS — BP 126/71 | HR 83 | Temp 97.4°F | Resp 18 | Ht 66.0 in | Wt 111.9 lb

## 2020-03-23 DIAGNOSIS — F411 Generalized anxiety disorder: Secondary | ICD-10-CM

## 2020-03-23 DIAGNOSIS — Z853 Personal history of malignant neoplasm of breast: Secondary | ICD-10-CM | POA: Insufficient documentation

## 2020-03-23 DIAGNOSIS — Z7189 Other specified counseling: Secondary | ICD-10-CM

## 2020-03-23 DIAGNOSIS — C8440 Peripheral T-cell lymphoma, not classified, unspecified site: Secondary | ICD-10-CM

## 2020-03-23 DIAGNOSIS — C801 Malignant (primary) neoplasm, unspecified: Secondary | ICD-10-CM | POA: Diagnosis not present

## 2020-03-23 DIAGNOSIS — Z5111 Encounter for antineoplastic chemotherapy: Secondary | ICD-10-CM | POA: Diagnosis not present

## 2020-03-23 DIAGNOSIS — Z5112 Encounter for antineoplastic immunotherapy: Secondary | ICD-10-CM | POA: Insufficient documentation

## 2020-03-23 DIAGNOSIS — C865 Angioimmunoblastic T-cell lymphoma: Secondary | ICD-10-CM | POA: Insufficient documentation

## 2020-03-23 DIAGNOSIS — Z95828 Presence of other vascular implants and grafts: Secondary | ICD-10-CM

## 2020-03-23 LAB — CBC WITH DIFFERENTIAL (CANCER CENTER ONLY)
Abs Immature Granulocytes: 0.01 10*3/uL (ref 0.00–0.07)
Basophils Absolute: 0 10*3/uL (ref 0.0–0.1)
Basophils Relative: 1 %
Eosinophils Absolute: 0.1 10*3/uL (ref 0.0–0.5)
Eosinophils Relative: 2 %
HCT: 41.3 % (ref 36.0–46.0)
Hemoglobin: 13.6 g/dL (ref 12.0–15.0)
Immature Granulocytes: 0 %
Lymphocytes Relative: 21 %
Lymphs Abs: 0.7 10*3/uL (ref 0.7–4.0)
MCH: 29.8 pg (ref 26.0–34.0)
MCHC: 32.9 g/dL (ref 30.0–36.0)
MCV: 90.6 fL (ref 80.0–100.0)
Monocytes Absolute: 0.5 10*3/uL (ref 0.1–1.0)
Monocytes Relative: 15 %
Neutro Abs: 1.9 10*3/uL (ref 1.7–7.7)
Neutrophils Relative %: 61 %
Platelet Count: 199 10*3/uL (ref 150–400)
RBC: 4.56 MIL/uL (ref 3.87–5.11)
RDW: 14.9 % (ref 11.5–15.5)
WBC Count: 3.1 10*3/uL — ABNORMAL LOW (ref 4.0–10.5)
nRBC: 0 % (ref 0.0–0.2)

## 2020-03-23 LAB — CMP (CANCER CENTER ONLY)
ALT: 12 U/L (ref 0–44)
AST: 16 U/L (ref 15–41)
Albumin: 3.9 g/dL (ref 3.5–5.0)
Alkaline Phosphatase: 74 U/L (ref 38–126)
Anion gap: 9 (ref 5–15)
BUN: 24 mg/dL — ABNORMAL HIGH (ref 8–23)
CO2: 27 mmol/L (ref 22–32)
Calcium: 9.3 mg/dL (ref 8.9–10.3)
Chloride: 108 mmol/L (ref 98–111)
Creatinine: 0.77 mg/dL (ref 0.44–1.00)
GFR, Estimated: >60 mL/min (ref >60)
Glucose, Bld: 91 mg/dL (ref 70–99)
Potassium: 4 mmol/L (ref 3.5–5.1)
Sodium: 144 mmol/L (ref 135–145)
Total Bilirubin: 0.5 mg/dL (ref 0.3–1.2)
Total Protein: 6.6 g/dL (ref 6.5–8.1)

## 2020-03-23 LAB — LACTATE DEHYDROGENASE: LDH: 199 U/L — ABNORMAL HIGH (ref 98–192)

## 2020-03-23 MED ORDER — DEXAMETHASONE SODIUM PHOSPHATE 10 MG/ML IJ SOLN
INTRAMUSCULAR | Status: AC
  Filled 2020-03-23: qty 1

## 2020-03-23 MED ORDER — SODIUM CHLORIDE 0.9 % IV SOLN
1.8000 mg/kg | Freq: Once | INTRAVENOUS | Status: AC
  Administered 2020-03-23: 100 mg via INTRAVENOUS
  Filled 2020-03-23: qty 20

## 2020-03-23 MED ORDER — SODIUM CHLORIDE 0.9 % IV SOLN: 5.0000 mg | Freq: Once | INTRAVENOUS | Status: DC

## 2020-03-23 MED ORDER — ACETAMINOPHEN 325 MG PO TABS
ORAL_TABLET | ORAL | Status: AC
  Filled 2020-03-23: qty 2

## 2020-03-23 MED ORDER — ACETAMINOPHEN 325 MG PO TABS
650.0000 mg | ORAL_TABLET | Freq: Once | ORAL | Status: AC
  Administered 2020-03-23: 650 mg via ORAL

## 2020-03-23 MED ORDER — FAMOTIDINE IN NACL 20-0.9 MG/50ML-% IV SOLN
20.0000 mg | Freq: Once | INTRAVENOUS | Status: AC
  Administered 2020-03-23: 20 mg via INTRAVENOUS

## 2020-03-23 MED ORDER — SODIUM CHLORIDE 0.9% FLUSH
10.0000 mL | INTRAVENOUS | Status: DC | PRN
  Administered 2020-03-23: 10 mL
  Filled 2020-03-23: qty 10

## 2020-03-23 MED ORDER — ONDANSETRON HCL 4 MG/2ML IJ SOLN
4.0000 mg | Freq: Once | INTRAMUSCULAR | Status: AC
  Administered 2020-03-23: 4 mg via INTRAVENOUS

## 2020-03-23 MED ORDER — SODIUM CHLORIDE 0.9 % IV SOLN
Freq: Once | INTRAVENOUS | Status: AC
  Filled 2020-03-23: qty 250

## 2020-03-23 MED ORDER — SODIUM CHLORIDE 0.9% FLUSH
10.0000 mL | Freq: Once | INTRAVENOUS | Status: AC
  Administered 2020-03-23: 10 mL
  Filled 2020-03-23: qty 10

## 2020-03-23 MED ORDER — ONDANSETRON HCL 4 MG/2ML IJ SOLN
INTRAMUSCULAR | Status: AC
  Filled 2020-03-23: qty 2

## 2020-03-23 MED ORDER — CLONAZEPAM 0.125 MG PO TBDP: 0.1250 mg | ORAL_TABLET | Freq: Two times a day (BID) | ORAL | 0 refills | Status: DC

## 2020-03-23 MED ORDER — DIPHENHYDRAMINE HCL 50 MG/ML IJ SOLN
INTRAMUSCULAR | Status: AC
  Filled 2020-03-23: qty 1

## 2020-03-23 MED ORDER — DEXAMETHASONE SODIUM PHOSPHATE 10 MG/ML IJ SOLN
5.0000 mg | Freq: Once | INTRAMUSCULAR | Status: AC
  Administered 2020-03-23: 5 mg via INTRAVENOUS

## 2020-03-23 MED ORDER — HEPARIN SOD (PORK) LOCK FLUSH 100 UNIT/ML IV SOLN
500.0000 [IU] | Freq: Once | INTRAVENOUS | Status: AC | PRN
  Administered 2020-03-23: 500 [IU]
  Filled 2020-03-23: qty 5

## 2020-03-23 MED ORDER — FAMOTIDINE IN NACL 20-0.9 MG/50ML-% IV SOLN
INTRAVENOUS | Status: AC
  Filled 2020-03-23: qty 50

## 2020-03-23 MED ORDER — DIPHENHYDRAMINE HCL 50 MG/ML IJ SOLN
50.0000 mg | Freq: Once | INTRAMUSCULAR | Status: AC
  Administered 2020-03-23: 50 mg via INTRAVENOUS

## 2020-03-23 NOTE — Patient Instructions (Signed)
[facility] Discharge Instructions for Patients Receiving Chemotherapy  Today you received the following chemotherapy agents brentuximab  To help prevent nausea and vomiting after your treatment, we encourage you to take your nausea medication as directed. If you develop nausea and vomiting that is not controlled by your nausea medication, call the clinic.   BELOW ARE SYMPTOMS THAT SHOULD BE REPORTED IMMEDIATELY:  *FEVER GREATER THAN 100.5 F  *CHILLS WITH OR WITHOUT FEVER  NAUSEA AND VOMITING THAT IS NOT CONTROLLED WITH YOUR NAUSEA MEDICATION  *UNUSUAL SHORTNESS OF BREATH  *UNUSUAL BRUISING OR BLEEDING  TENDERNESS IN MOUTH AND THROAT WITH OR WITHOUT PRESENCE OF ULCERS  *URINARY PROBLEMS  *BOWEL PROBLEMS  UNUSUAL RASH Items with * indicate a potential emergency and should be followed up as soon as possible.  Feel free to call the clinic should you have any questions or concerns. The clinic phone number is [phone].  Please show the [facility] at check-in to the Emergency Department and triage nurse.

## 2020-03-23 NOTE — Progress Notes (Signed)
HEMATOLOGY/ONCOLOGY CLINIC NOTE  Date of Service: 03/23/2020  Patient Care Team: Lajean Manes, MD as PCP - General (Internal Medicine) Buford Dresser, MD as PCP - Cardiology (Cardiology)  REFERRING PHYSICIAN: Lajean Manes, MD  CHIEF COMPLAINTS/PURPOSE OF CONSULTATION:  Continue mx of Recently Diagnosed Angioimmunoblastic T cell lymphoma      HISTORY OF PRESENTING ILLNESS:   Sarah [name] is a wonderful 87 y.o. female who has been referred to [place] by Lajean Manes, MD for evaluation and management of Possible lymphoma . She is accompanied today by [name] . The pt reports that she is doing well overall.   Pending Surgical pathology from 03/17/2019 Dr.Manny will contact clinic about pathology   August 25th/27th she began coughing and sneezing. On Sept 3 went to doctors and they said it was allergies. She began having rashes (little red dots). Went to PCP and they prescribed prednisone and for several days and the medication helped. But as soon as she completed the prescription symptoms reoccurred.   She had a chest X Ray done because she was having a severe cough on 02/17/2019 revealing "bilateral effusions in this patient with history of breast cancer,of uncertain significance with question of right lower lobe pulmonary nodule, consider chest CT for further assessment. Atherosclerotic changes in the thoracic aorta."  Dr. Amy Martinique biopsied the rashes on her back 02/21/2019 and it was determined that it could be because of an allergy to medication. Dr.Stoneking stopped her blood pressure medication from September- October but ther rash still present. The itching started 1 to 2 months ago. The rash would come and go and was mostly on her back. She still has rash on her left thigh.  She is using triaconazole and cetrizine.  She has lumps behind ear and abdomin and she states that they have swollen twice the size within 1 and half months  She has no appetite and  has changes in her taste that began around August. She has changes in bowels. They are more loose and smaller In size. She also has no control over bowels.  She feels fatigued and it has increased since August.   She has a cough that starts in September  that has made her hoarse.   She had a CT scan on 02/27/2019 because she was having severe cough and her stomach was very extended. Revealing "1. Extensive lymphadenopathy throughout the chest, abdomen, and pelvis, with index lymph nodes identified above. Splenomegaly. Findings are most consistent with lymphoma with recurrent, metastatic breast malignancy less favored. 2. There is generally osteopenic appearance of the skeleton. There are numerous subtle hypodense lesions, particularly of the pelvis (e.g. Series 2, image 88) and select vertebral bodies, for example L4 (series 8, image 92). This is suspicious although not definitive for osseous metastatic disease. Characterization for metabolic activity by nuclear scintigraphic bone scan or PET-CT would be helpful to further evaluate. 3. Moderate right, small left pleural effusions with associated atelectasis or consolidation. Subpleural radiation fibrosis of the anterior right lung. 4. There is a new subpleural pulmonary nodule of the anterior right middle lobe measuring 6 mm (series 4, image 105), nonspecific. There is no obvious pleural thickening or nodularity definitive for metastatic disease. 5.  Small volume ascites. 6. There is a fluid attenuation lesion of the pancreatic uncinate measuring 2.4 x 1.3 cm (series 2, image 63), not substantially changed when compared to remote prior MR examination dated 02/23/2012 and likely sequelae of a prior pseudocyst versus incidental pancreatic IPMN. 7.  Cardiomegaly."  Hospitalized from 03/06/2019-03/08/2019. She presenting with acute shortness of breath, leg swelling,left hand swelling and some weight gain despite poor appetite. In ED, CTA chest revealed at least  submassive PE, moderate right pleural effusion, diffuse LAD concerning for lymphoma. Started on heparin drip. PCCM consulted for guidance. She had thoracocentesis with removal of 1200 cc cloudy exudative pleural fluid. Cardiology consulted for elevated which was thought to be demand ischemia from right heart strain in the setting of PE. Patient was transitioned to subcu Lovenox by pulmonology the next day and remained stable. Unusual appearance of tracheal air column at the thoracic inlet. Correlate with any history of swallowing difficulty or changes in voice with dedicated imaging with chest CT and or CT of the neck as indicated.  She has a nonhealing ulcer on her right tibialis anterior that starting weeping one week ago. Of note since the patient's last visit, pt has had CHEST XRAY - 2 VIEW (Accession [id number]) completed on 02/17/2019 with results revealing "Bilateral effusions in this patient with history of breast cancer,of uncertain significance with question of right lower lobepulmonary nodule, consider chest CT for furtherassessment.Unusual appearance of tracheal air column at the thoracic inlet.Correlate with any history of swallowing difficulty or changes in voice with dedicated imaging with chest CT and or CT of the neck as Indicated. Atherosclerotic changes in the thoracic aorta."  Of note since the patient's last visit, pt has had CT CHEST, ABDOMEN, AND PELVIS WITH CONTRAST (Accession [id number]) completed on 02/27/2019 with results revealing "1. Extensive lymphadenopathy throughout the chest, abdomen, and pelvis, with index lymph nodes identified above. Splenomegaly.Findings are most consistent with lymphoma with recurrent, metastatic breast malignancy less favored. 2. There is generally osteopenic appearance of the skeleton. There are numerous subtle hypodense lesions, particularly of the pelvis (e.g. Series 2, image 88) and select vertebral bodies, for example L4 (series 8, image 92).  This is suspicious although not definitive for osseous metastatic disease. Characterization for metabolic activity by nuclear scintigraphic bone scan or PET-CT would be helpful to further evaluate.  3. Moderate right, small left pleural effusions with associated atelectasis or consolidation. Subpleural radiation fibrosis of the anterior right lung.4. There is a new subpleural pulmonary nodule of the anterior right middle lobe measuring 6 mm (series 4, image 105), nonspecific. There is no obvious pleural thickening or nodularity definitive for metastatic disease.5.  Small volume ascites. 6. There is a fluid attenuation lesion of the pancreatic uncinate measuring 2.4 x 1.3 cm (series 2, image 63), not substantially changed when compared to remote prior MR examination dated 02/23/2012 and likely sequelae of a prior pseudocyst versus incidental pancreatic IPMN.7.  Cardiomegaly.8. Other chronic and incidental findings as detailed above. Aortic Atherosclerosis (ICD10-I70.0)."  Of note since the patient's last visit, pt has had PORTABLE CHEST 1 VIEW (Accession [id number]) completed on 03/06/2019 with results revealing "1. Moderate size right and small left pleural effusions appear not significantly changed since 02/27/19. 2. No new cardiopulmonary abnormality."  Of note since the patient's last visit, pt has had ECHOCARDIOGRAM  completed on 03/06/2019 with results revealing  "1. Left ventricular ejection fraction, by visual estimation, is 60 to 65%. The left ventricle has normal function. Normal left ventricular size. There is no left ventricular hypertrophy. 2. Left ventricular diastolic Doppler parameters are consistent with impaired relaxation pattern of LV diastolic filling. 3. Global right ventricle has mildly reduced systolic function.The right ventricular size is moderately enlarged. No increase in right ventricular wall thickness. 4. Left atrial size was normal.  5. Right atrial size was mildly dilated.  6. Trivial pericardial effusion is present. 7. The mitral valve is normal in structure. Trace mitral valve regurgitation. No evidence of mitral stenosis. 8. The tricuspid valve is normal in structure. Tricuspid valve regurgitation severe. 9. The aortic valve is normal in structure. Aortic valve regurgitation was not visualized by color flow Doppler. Structurally normal aortic valve, with no evidence of sclerosis or stenosis.10. The pulmonic valve was normal in structure. Pulmonic valve regurgitation is mild by color flow Doppler.11. Mildly elevated pulmonary artery systolic pressure.12. The inferior vena cava is normal in size with greater than 50% respiratory variability, suggesting right atrial pressure of 3 mmHg."  Of note since the patient's last visit, pt has had CT ANGIOGRAPHY CHEST WITH CONTRAST (Accession [id number]) completed on 03/06/2019 with results revealing "1. Pulmonary embolus arising from the distal left main pulmonary artery with extension into multiple left lower lobe pulmonary arterial branches. There is also incompletely obstructing pulmonary embolus in the proximal left upper lobe pulmonary artery. More distal pulmonary embolus in the right lower lobe pulmonary artery. Positive for acute PE with CT evidence of right heart strain (RV/LV Ratio = 1.6) consistent with at least submassive (intermediate risk) PE. The presence of right heart strain has been associated with an increased risk of morbidity and mortality. Please activate Code PE by paging [phone]. 2. Sizable pleural effusion on the right with smaller pleural effusion on the left. Consolidation and compressive atelectasis throughout most of the right lower lobe. Milder atelectasis left base. Nodular opacities on the right, likely metastatic foci.3.  Stable adenopathy compared to 1 week prior.4. Enlargement of spleen, incompletely visualized but documented CT 1 week prior. Concern for potential lymphoma. 5. Aortic atherosclerosis.  No aneurysm or dissection evident. Foci of coronary artery calcification noted."  Most recent lab results (03/06/2019) of CBC is as follows: all values are WNL except for Platelets at 72, Eosinophils Absolute at 0.6,Abs Immature Granulocytes at 0.08 .  On review of systems, pt reports rashes, digestive issues and denies back pain, abdominal pain and any other symptoms.   On PMHx the pt reports Hypothyroidism, MCI, Hypercholesterolemia, Tracheal anomaly, Hypertension, Pulmonary nodule, Atherosclerosis of aorta, anxiety, gait disorder, primary insomnia.   INTERVAL HISTORY: Sarah [name] is a 86 y.o. female here for evaluation and management of Angioimmunoblastic T-cell lymphoma. We are joined today by her daughter, [name]. The patient's last visit with [place] was on 02/10/2020. The pt reports that she is doing well overall.  The pt reports that she feels more anxiety and depression since our last meeting. She reports sadness, emotional discomfort, and intrusive thoughts. Pt has been taking Lorazepam as needed. Usually once per day or once every other day. Pt denies any issues with sleep and continues taking Ambien at bedtime. She is considering moving to an assisted living facility, but seems very hesitant to do so.  Lab results today (03/23/20) of CBC w/diff and CMP is as follows: all values are WNL except for WBC at 3.1K, BUN at 24. 03/23/2020 LDH at 199  On review of systems, pt reports anxiety, depressive moods and denies sleeplessness, itching, rash, abdominal pain/distention, constipation, SOB, tingling/numbness in hands/feet and any other symptoms.    MEDICAL HISTORY:  Past Medical History:  Diagnosis Date  . Allergy    codeine, thiazides  . Anxiety    new dx  . Arthritis   . Breast cancer ([place]) 03/14/12   bx=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,ER/PR=+,upper inner quad  . Cancer (  [place])    breast  . Cataract   . DVT (deep venous thrombosis) ([place]) 02/2019   left leg    . Dyspnea   . Edema    both legs feet and toe, abdomen  . Glaucoma    laser treated years ago  . HOH (hard of hearing)   . Hyperlipemia   . Hypertension   . Hypothyroidism   . Pancreatic cyst    benign  . PONV (postoperative nausea and vomiting)   . Pulmonary embolism ([place]) 02/2019   bilateral   . Radiation 06/11/2012-07/12/2012   17 sessions 4250 cGy, 3 sessions 750 cGy  . Vertigo   . Wears glasses   . Wears partial dentures    partial upper     SURGICAL HISTORY: Past Surgical History:  Procedure Laterality Date  . ABDOMINAL HYSTERECTOMY  1966   1/2 ovary left in   . BREAST SURGERY  1992   lumpectomy-lt  . CATARACT EXTRACTION     b/l  . COLONOSCOPY    . EXCISION MASS NECK Left 03/17/2019    EXCISION MASS NECK (Left Neck)  . EXCISION MASS NECK Left 03/17/2019   Procedure: EXCISION MASS NECK;  Surgeon: Helayne Seminole, MD;  Location: [facility];  Service: ENT;  Laterality: Left;  . EYE SURGERY Bilateral    bilateral cataract removal  . IR IMAGING GUIDED PORT INSERTION  04/23/2019  . JOINT REPLACEMENT  2013   rt total knee  . JOINT REPLACEMENT  1995   lt total knee  . PARTIAL MASTECTOMY WITH NEEDLE LOCALIZATION  04/09/2012   Procedure: PARTIAL MASTECTOMY WITH NEEDLE LOCALIZATION;  Surgeon: Adin Hector, MD;  Location: [facility];  Service: General;  Laterality: Right;  . SKIN BIOPSY Left 03/17/2019   LEFT THIGH  . SKIN BIOPSY Left 03/17/2019   Procedure: Skin Biopsy Left Thigh;  Surgeon: Helayne Seminole, MD;  Location: [facility];  Service: ENT;  Laterality: Left;  . TONSILLECTOMY    . TOTAL KNEE ARTHROPLASTY  08/16/2011   Procedure: TOTAL KNEE ARTHROPLASTY;  Surgeon: Ninetta Lights, MD;  Location: [facility];  Service: Orthopedics;  Laterality: Right;     SOCIAL HISTORY: Social History   Socioeconomic History  . Marital status: Widowed    Spouse name: Not on file  . Number of children: 2  . Years of education: Not on file  . Highest  education level: Not on file  Occupational History  . Occupation: Retired    Comment: Community education officer   Tobacco Use  . Smoking status: Never Smoker  . Smokeless tobacco: Never Used  Vaping Use  . Vaping Use: Never used  Substance and Sexual Activity  . Alcohol use: No  . Drug use: No  . Sexual activity: Not Currently  Other Topics Concern  . Not on file  Social History Narrative  . Not on file   Social Determinants of Health   Financial Resource Strain:   . Difficulty of Paying Living Expenses: Not on file  Food Insecurity:   . Worried About Charity fundraiser in the Last Year: Not on file  . Ran Out of Food in the Last Year: Not on file  Transportation Needs:   . Lack of Transportation (Medical): Not on file  . Lack of Transportation (Non-Medical): Not on file  Physical Activity:   . Days of Exercise per Week: Not on file  . Minutes of Exercise per Session: Not on file  Stress:   .  Feeling of Stress : Not on file  Social Connections:   . Frequency of Communication with Friends and Family: Not on file  . Frequency of Social Gatherings with Friends and Family: Not on file  . Attends Religious Services: Not on file  . Active Member of Clubs or Organizations: Not on file  . Attends Archivist Meetings: Not on file  . Marital Status: Not on file  Intimate Partner Violence:   . Fear of Current or Ex-Partner: Not on file  . Emotionally Abused: Not on file  . Physically Abused: Not on file  . Sexually Abused: Not on file     FAMILY HISTORY: Family History  Problem Relation Age of Onset  . Heart disease Father   . Cancer Maternal Aunt        stomach     ALLERGIES:   is allergic to citalopram, codeine, latex, and thiazide-type diuretics.   MEDICATIONS:  Current Outpatient Medications  Medication Sig Dispense Refill  . amLODipine (NORVASC) 2.5 MG tablet Take 2.5-5 mg by mouth See admin instructions. 5 mg in the morning, 2.5 mg in the evening    .  amoxicillin (AMOXIL) 500 MG capsule Take 4 capsules (2,000 mg total) by mouth once as needed for up to 1 dose. 60 mins prior to dental procedure 4 capsule 0  . apixaban (ELIQUIS) 5 MG TABS tablet Take 1 tablet (5 mg total) by mouth 2 (two) times daily. 180 tablet 1  . benzonatate (TESSALON) 100 MG capsule Take 1 capsule (100 mg total) by mouth 3 (three) times daily as needed for cough. 30 capsule 0  . Cholecalciferol (VITAMIN D) 50 MCG (2000 UT) tablet Take 2,000 Units by mouth 2 (two) times daily.    . clonazepam (KLONOPIN) 0.125 MG disintegrating tablet Take 1 tablet (0.125 mg total) by mouth 2 (two) times daily. 60 tablet 0  . Cyanocobalamin (VITAMIN B-12 PO) Take 3,000 mcg by mouth daily.     Marland Kitchen levothyroxine (SYNTHROID) 25 MCG tablet Take 25 mcg by mouth daily before breakfast.     . lidocaine-prilocaine (EMLA) cream Apply to affected area once 30 g 3  . LORazepam (ATIVAN) 0.5 MG tablet TAKE 1/2 TO 1 (ONE-HALF TO ONE) TABLET BY MOUTH EVERY 8 HOURS 30 tablet 0  . Multiple Vitamins-Minerals (PRESERVISION AREDS PO) Take 1 capsule by mouth 2 (two) times daily.     . ondansetron (ZOFRAN) 8 MG tablet Take 1 tablet (8 mg total) by mouth 2 (two) times daily as needed. Start on the third day after chemotherapy. 30 tablet 1  . polyethylene glycol (MIRALAX / GLYCOLAX) 17 g packet Take 17 g by mouth daily.    . prochlorperazine (COMPAZINE) 10 MG tablet Take 1 tablet (10 mg total) by mouth every 6 (six) hours as needed (Nausea or vomiting). 30 tablet 1  . sodium chloride (MURO 128) 5 % ophthalmic ointment Place 1 application into both eyes at bedtime.    Marland Kitchen XIIDRA 5 % SOLN Place 1 drop into both eyes daily as needed (dry eyes).     Marland Kitchen zolpidem (AMBIEN) 5 MG tablet Take 5 mg by mouth at bedtime as needed for sleep.      No current facility-administered medications for this visit.     REVIEW OF SYSTEMS:   A 10+ POINT REVIEW OF SYSTEMS WAS OBTAINED including neurology, dermatology, psychiatry, cardiac,  respiratory, lymph, extremities, GI, GU, Musculoskeletal, constitutional, breasts, reproductive, HEENT.  All pertinent positives are noted in the HPI.  All  others are negative.   PHYSICAL EXAMINATION: ECOG FS:1 - Symptomatic but completely ambulatory  Vitals:   03/23/20 1024  BP: 126/71  Pulse: 83  Resp: 18  Temp: (!) 97.4 F (36.3 C)  SpO2: 99%   Wt Readings from Last 3 Encounters:  03/23/20 111 lb 14.4 oz (50.8 kg)  03/01/20 112 lb 8 oz (51 kg)  02/10/20 110 lb 9.6 oz (50.2 kg)   Body mass index is 18.06 kg/m.    Exam was given in a chair   GENERAL:alert, in no acute distress and comfortable SKIN: no acute rashes, no significant lesions EYES: conjunctiva are pink and non-injected, sclera anicteric OROPHARYNX: MMM, no exudates, no oropharyngeal erythema or ulceration NECK: supple, no JVD LYMPH:  no palpable lymphadenopathy in the cervical, axillary or inguinal regions LUNGS: clear to auscultation b/l with normal respiratory effort HEART: regular rate & rhythm ABDOMEN:  normoactive bowel sounds , non tender, not distended. No palpable hepatosplenomegaly.  Extremity: no pedal edema PSYCH: alert & oriented x 3 with fluent speech NEURO: no focal motor/sensory deficits  LABORATORY DATA:  I have reviewed the data as listed  CBC Latest Ref Rng & Units 03/23/2020 03/01/2020 02/10/2020  WBC 4.0 - 10.5 K/uL 3.1(L) 3.4(L) 3.4(L)  Hemoglobin 12.0 - 15.0 g/dL 13.6 13.6 13.2  Hematocrit 36 - 46 % 41.3 41.8 40.5  Platelets 150 - 400 K/uL 199 196 194    CMP Latest Ref Rng & Units 03/23/2020 03/01/2020 02/10/2020  Glucose 70 - 99 mg/dL 91 81 97  BUN 8 - 23 mg/dL 24(H) 17 23  Creatinine 0.44 - 1.00 mg/dL 0.77 0.66 0.74  Sodium 135 - 145 mmol/L 144 140 142  Potassium 3.5 - 5.1 mmol/L 4.0 3.8 3.6  Chloride 98 - 111 mmol/L 108 105 106  CO2 22 - 32 mmol/L 27 29 23   Calcium 8.9 - 10.3 mg/dL 9.3 9.8 9.6  Total Protein 6.5 - 8.1 g/dL 6.6 7.0 6.9  Total Bilirubin 0.3 - 1.2 mg/dL 0.5 0.6  0.6  Alkaline Phos 38 - 126 U/L 74 70 68  AST 15 - 41 U/L 16 16 16   ALT 0 - 44 U/L 12 12 14    Component     Latest Ref Rng & Units 03/27/2019  Hepatitis B Surface Ag     NON REACTIVE NON REACTIVE  Hep B Core Total Ab     NON REACTIVE NON REACTIVE  HCV Ab     NON REACTIVE NON REACTIVE  LDH     98 - 192 U/L 252 (H)   . Lab Results  Component Value Date   LDH 199 (H) 03/23/2020      04/08/2019 NM PET Image Initial (PI) Skull Base To Thigh (Accession [id number])  04/04/2019 PATHOLOGY   04/07/2019 ECHOCARDIOGRAM  03/28/2019 PATHOLOGY   03/06/2019 CT ANGIOGRAPHY CHEST WITH CONTRAST (Accession [id number])  04/07/2019 ECHOCARDIOGRAM  03/06/2019 ECHOCARDIOGRAM     03/06/2019 PORTABLE CHEST 1 VIEW (Accession [id number])    02/27/2019 CT CHEST, ABDOMEN, AND PELVIS WITH CONTRAST (Accession [id number])    02/17/2019 CHEST XRAY - 2 VIEW (Accession [id number])    RADIOGRAPHIC STUDIES: I have personally reviewed the radiological images as listed and agreed with the findings in the report. No results found.   ASSESSMENT & PLAN:   SYMIAH NOWOTNY is a 86 y.o. female with:  1. 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 SKIN, LEFT THIGH, BIOPSY: -  Involvement by angioimmunoblastic T-cell lymphoma." -04/07/2019 Echocardiogram - normal EF -04/08/2019 NM PET Image Initial (PI) Skull Base To Thigh (Accession [id number]) revealed "1. Widespread hypermetabolic adenopathy in the neck, chest, and abdomen/pelvis, primarily in the Deauville 4 and Deauville 5 range. There is also hypermetabolic activity along the posterior nasopharynx in lingual tonsillar regions potentially indicating sites of involvement. 2. The subtle hypodensities in the spine and bony pelvis are not appreciably hypermetabolic may be incidental, surveillance  is suggested. 3. Bilateral thyroid activity but especially on the left. Probably from thyroiditis. 4. A 6 mm subpleural nodule anteriorly in the right lower lobe not appreciably hypermetabolic but is below sensitive PET-CT size thresholds and merits surveillance. 5. Mildly accentuated diffuse splenic activity without splenomegaly or focal splenic lesion identified. 6. Other imaging findings of potential clinical significance: Aortic Atherosclerosis (ICD10-I70.0). Coronary atherosclerosis. Large right and small left pleural effusions. Moderate cardiomegaly. Small pericardial effusion. Mild mesenteric edema. Large left renal cyst. Fullness of both renal collecting systems with suspected nonobstructive left nephrolithiasis. Degenerative grade 1 anterolisthesis at L4-5 at L5-S1. Trace pelvic ascites." -05/28/2019 PET/CT ([id number]) revealed "Complete metabolic response to therapy". -09/16/2019 PET/CT ([id number]) revealed "1. Unfortunately evidence of lymphoma recurrence at sites of previous hypermetabolic adenopathy. Hypermetabolic lymphoid tissue is new from comparison PET-CT 05/28/2019 but at same locations as PET-CT 04/08/2019. The number of metastatic lymph nodes is less than 04/08/2019. Lymphoid tissue at the RIGHT skull base is larger. 2. Three foci of hypermetabolic recurrence are present, lymphoid tissue at the RIGHT base of skull, hypermetabolic RIGHT hilar lymph node and hypermetabolic lymph node in the porta hepatis." -11/25/2019 PET/CT ([id number]) revealed "1. Marked interval improvement in the hypermetabolic disease identified previously in the posterior right nasopharynx, compatible with Deauville 4 today. 2. Decreased hypermetabolism in the right hilar and porta hepatis lymph nodes identified previously (Deauville 3). No new sites of hypermetabolic disease in the chest, abdomen, or pelvis. 3. Similar diffuse thyroid uptake, left greater than right. 4. No substantial change in right-sided pulmonary  nodules."  2. Pulmonary Embolism -extensive .likely related to extensive malignancy  3. H/o Breast Cancer  -The patient had bilateral diagnostic  mammography at [facility] 07/11/2011. This  showed some calcifications in the right  breast, which seemed a little bit more  prominent than prior. In the left breast  there was an area of possible  architectural distortion. However left  breast ultrasound the same day showed  no abnormality. 6 month followup was  suggested, and on 02/28/2012 the  patient again had bilateral diagnostic  mammography, now with right  ultrasonography. The microcalcifications  in the right breast appeared increased.  Ultrasound showed a hypoechoic lesion  measuring 5 mm, with no associated  shadowing. This had been previously  noted and appeared unchanged. Anterior  to that there was a small hypoechoic  mass measuring 4 mm in diameter.  There was felt to be suspicious, and on  03/14/2012 the patient underwent biopsy  of the right breast mass, showing ([id number]  [id number]) ductal carcinoma in situ,  intermediate grade, estrogen receptor  100% and progesterone receptor 100%  positive.   -Bilateral breast MRI was obtained  03/26/2012. This showed only post  biopsy changes in the right breast,  associated with an area of non-masslike  enhancement measuring 2.4 cm. The  left breast was unremarkable, and there  was no enlarged axillary or internal  mammary adenopathy noted.  Accordingly on 04/09/2012 the patient  underwent right lumpectomy, the  pathology ([id number]) showing ductal  carcinoma  in situ measuring 2.0 cm,  grade 2, with negative margins, the  closest being 0.3 cm. The patient's  subsequent history is as detailed below.   PLAN: -Discussed pt labwork today, 03/23/20; blood counts and chemistries are nml, LDH is borderline elevated but stable.  -The pt has no prohibitive toxicities from continuing C15 Brentuximab at this time.  -No lab or clinical evidence of Angioimmunoblastic T-cell  lymphoma progression at this time. -Advised pt to take Clonazepam twice per day, or only at night if she becomes too drowsy.  -Gave emotional counsel and discussed options for patient to stay in her home and function optimally.  -Recommended that the pt continue to eat well, drink at least 48-64 oz of water each day, and walk 20-30 minutes each day.  -Rx low-dose Clonazepam -Continue B-complex vitamin -Will repeat PET/CT in 2 weeks -Will see back in 3 weeks with labs   FOLLOW-UP: PET/CT in 2 weeks RTC for next treatment , portflush, labs and MD visit as scheduled in 3 weeks   The total time spent in the appt was 30 minutes and more than 50% was on counseling and direct patient cares.  All of the patient's questions were answered with apparent satisfaction. The patient knows to call the clinic with any problems, questions or concerns.    Sullivan Lone MD [place] AAHIVMS [facility] Hematology/Oncology Physician [facility]  (Office):       [phone] (Work cell):  [phone] (Fax):           [phone]  03/23/2020 11:21 AM   I, Yevette Edwards, am acting as a scribe for Dr. Sullivan Lone.   .I have reviewed the above documentation for accuracy and completeness, and I agree with the above. Brunetta Genera MD

## 2020-04-06 DIAGNOSIS — H3554 Dystrophies primarily involving the retinal pigment epithelium: Secondary | ICD-10-CM | POA: Diagnosis not present

## 2020-04-06 DIAGNOSIS — H16223 Keratoconjunctivitis sicca, not specified as Sjogren's, bilateral: Secondary | ICD-10-CM | POA: Diagnosis not present

## 2020-04-06 DIAGNOSIS — Z961 Presence of intraocular lens: Secondary | ICD-10-CM | POA: Diagnosis not present

## 2020-04-06 DIAGNOSIS — H11423 Conjunctival edema, bilateral: Secondary | ICD-10-CM | POA: Diagnosis not present

## 2020-04-06 DIAGNOSIS — H40013 Open angle with borderline findings, low risk, bilateral: Secondary | ICD-10-CM | POA: Diagnosis not present

## 2020-04-06 DIAGNOSIS — H18452 Nodular corneal degeneration, left eye: Secondary | ICD-10-CM | POA: Diagnosis not present

## 2020-04-07 DIAGNOSIS — E039 Hypothyroidism, unspecified: Secondary | ICD-10-CM | POA: Diagnosis not present

## 2020-04-07 DIAGNOSIS — C865 Angioimmunoblastic T-cell lymphoma: Secondary | ICD-10-CM | POA: Diagnosis not present

## 2020-04-07 DIAGNOSIS — E7800 Pure hypercholesterolemia, unspecified: Secondary | ICD-10-CM | POA: Diagnosis not present

## 2020-04-07 DIAGNOSIS — F5101 Primary insomnia: Secondary | ICD-10-CM | POA: Diagnosis not present

## 2020-04-07 DIAGNOSIS — Z0000 Encounter for general adult medical examination without abnormal findings: Secondary | ICD-10-CM | POA: Diagnosis not present

## 2020-04-07 DIAGNOSIS — I700 Atherosclerosis of aorta: Secondary | ICD-10-CM | POA: Diagnosis not present

## 2020-04-07 DIAGNOSIS — Z1389 Encounter for screening for other disorder: Secondary | ICD-10-CM | POA: Diagnosis not present

## 2020-04-07 DIAGNOSIS — I10 Essential (primary) hypertension: Secondary | ICD-10-CM | POA: Diagnosis not present

## 2020-04-09 ENCOUNTER — Ambulatory Visit (HOSPITAL_COMMUNITY)
Admission: RE | Admit: 2020-04-09 | Discharge: 2020-04-09 | Disposition: A | Discharge status: Home / Self Care | Payer: Medicare Other | Source: Ambulatory Visit | Attending: Hematology | Admitting: Hematology

## 2020-04-09 ENCOUNTER — Other Ambulatory Visit: Payer: Self-pay

## 2020-04-09 DIAGNOSIS — C8440 Peripheral T-cell lymphoma, not classified, unspecified site: Secondary | ICD-10-CM | POA: Insufficient documentation

## 2020-04-09 DIAGNOSIS — R918 Other nonspecific abnormal finding of lung field: Secondary | ICD-10-CM | POA: Diagnosis not present

## 2020-04-09 DIAGNOSIS — R739 Hyperglycemia, unspecified: Secondary | ICD-10-CM | POA: Insufficient documentation

## 2020-04-09 DIAGNOSIS — I700 Atherosclerosis of aorta: Secondary | ICD-10-CM | POA: Diagnosis not present

## 2020-04-09 DIAGNOSIS — Z5111 Encounter for antineoplastic chemotherapy: Secondary | ICD-10-CM

## 2020-04-09 DIAGNOSIS — N281 Cyst of kidney, acquired: Secondary | ICD-10-CM | POA: Diagnosis not present

## 2020-04-09 DIAGNOSIS — C8448 Peripheral T-cell lymphoma, not classified, lymph nodes of multiple sites: Secondary | ICD-10-CM | POA: Diagnosis not present

## 2020-04-09 LAB — GLUCOSE, CAPILLARY: Glucose-Capillary: 91 mg/dL (ref 70–99)

## 2020-04-09 MED ORDER — FLUDEOXYGLUCOSE F - 18 (FDG) INJECTION
5.7000 | Freq: Once | INTRAVENOUS | Status: AC | PRN
  Administered 2020-04-09: 5.7 via INTRAVENOUS

## 2020-04-12 NOTE — Progress Notes (Signed)
HEMATOLOGY/ONCOLOGY CLINIC NOTE  Date of Service: 04/13/2020  Patient Care Team: Lajean Manes, MD as PCP - General (Internal Medicine) Buford Dresser, MD as PCP - Cardiology (Cardiology)  REFERRING PHYSICIAN: Lajean Manes, MD  CHIEF COMPLAINTS/PURPOSE OF CONSULTATION:  Continue mx of Recently Diagnosed Angioimmunoblastic T cell lymphoma      HISTORY OF PRESENTING ILLNESS:   DEMICA [name] is a wonderful 86 y.o. female who has been referred to [place] by Lajean Manes, MD for evaluation and management of Possible lymphoma . She is accompanied today by her daughter Sarah [name] . The pt reports that she is doing well overall.   Pending Surgical pathology from 03/17/2019 Dr.Manny will contact clinic about pathology   August 25th/27th she began coughing and sneezing. On Sept 3 went to doctors and they said it was allergies. She began having rashes (little red dots). Went to PCP and they prescribed prednisone and for several days and the medication helped. But as soon as she completed the prescription symptoms reoccurred.   She had a chest X Ray done because she was having a severe cough on 02/17/2019 revealing "bilateral effusions in this patient with history of breast cancer,of uncertain significance with question of right lower lobe pulmonary nodule, consider chest CT for further assessment. Atherosclerotic changes in the thoracic aorta."  Dr. Amy Martinique biopsied the rashes on her back 02/21/2019 and it was determined that it could be because of an allergy to medication. Dr.Stoneking stopped her blood pressure medication from September- October but ther rash still present. The itching started 1 to 2 months ago. The rash would come and go and was mostly on her back. She still has rash on her left thigh.  She is using triaconazole and cetrizine.  She has lumps behind ear and abdomin and she states that they have swollen twice the size within 1 and half months  She has no appetite and  has changes in her taste that began around August. She has changes in bowels. They are more loose and smaller In size. She also has no control over bowels.  She feels fatigued and it has increased since August.   She has a cough that starts in September  that has made her hoarse.   She had a CT scan on 02/27/2019 because she was having severe cough and her stomach was very extended. Revealing "1. Extensive lymphadenopathy throughout the chest, abdomen, and pelvis, with index lymph nodes identified above. Splenomegaly. Findings are most consistent with lymphoma with recurrent, metastatic breast malignancy less favored. 2. There is generally osteopenic appearance of the skeleton. There are numerous subtle hypodense lesions, particularly of the pelvis (e.g. Series 2, image 88) and select vertebral bodies, for example L4 (series 8, image 92). This is suspicious although not definitive for osseous metastatic disease. Characterization for metabolic activity by nuclear scintigraphic bone scan or PET-CT would be helpful to further evaluate. 3. Moderate right, small left pleural effusions with associated atelectasis or consolidation. Subpleural radiation fibrosis of the anterior right lung. 4. There is a new subpleural pulmonary nodule of the anterior right middle lobe measuring 6 mm (series 4, image 105), nonspecific. There is no obvious pleural thickening or nodularity definitive for metastatic disease. 5.  Small volume ascites. 6. There is a fluid attenuation lesion of the pancreatic uncinate measuring 2.4 x 1.3 cm (series 2, image 63), not substantially changed when compared to remote prior MR examination dated 02/23/2012 and likely sequelae of a prior pseudocyst versus incidental pancreatic IPMN. 7.  Cardiomegaly."  Hospitalized from 03/06/2019-03/08/2019. She presenting with acute shortness of breath, leg swelling,left hand swelling and some weight gain despite poor appetite. In ED, CTA chest revealed at least  submassive PE, moderate right pleural effusion, diffuse LAD concerning for lymphoma. Started on heparin drip. PCCM consulted for guidance. She had thoracocentesis with removal of 1200 cc cloudy exudative pleural fluid. Cardiology consulted for elevated which was thought to be demand ischemia from right heart strain in the setting of PE. Patient was transitioned to subcu Lovenox by pulmonology the next day and remained stable. Unusual appearance of tracheal air column at the thoracic inlet. Correlate with any history of swallowing difficulty or changes in voice with dedicated imaging with chest CT and or CT of the neck as indicated.  She has a nonhealing ulcer on her right tibialis anterior that starting weeping one week ago. Of note since the patient's last visit, pt has had CHEST XRAY - 2 VIEW (Accession [id number]) completed on 02/17/2019 with results revealing "Bilateral effusions in this patient with history of breast cancer,of uncertain significance with question of right lower lobepulmonary nodule, consider chest CT for furtherassessment.Unusual appearance of tracheal air column at the thoracic inlet.Correlate with any history of swallowing difficulty or changes in voice with dedicated imaging with chest CT and or CT of the neck as Indicated. Atherosclerotic changes in the thoracic aorta."  Of note since the patient's last visit, pt has had CT CHEST, ABDOMEN, AND PELVIS WITH CONTRAST (Accession [id number]) completed on 02/27/2019 with results revealing "1. Extensive lymphadenopathy throughout the chest, abdomen, and pelvis, with index lymph nodes identified above. Splenomegaly.Findings are most consistent with lymphoma with recurrent, metastatic breast malignancy less favored. 2. There is generally osteopenic appearance of the skeleton. There are numerous subtle hypodense lesions, particularly of the pelvis (e.g. Series 2, image 88) and select vertebral bodies, for example L4 (series 8, image 92).  This is suspicious although not definitive for osseous metastatic disease. Characterization for metabolic activity by nuclear scintigraphic bone scan or PET-CT would be helpful to further evaluate.  3. Moderate right, small left pleural effusions with associated atelectasis or consolidation. Subpleural radiation fibrosis of the anterior right lung.4. There is a new subpleural pulmonary nodule of the anterior right middle lobe measuring 6 mm (series 4, image 105), nonspecific. There is no obvious pleural thickening or nodularity definitive for metastatic disease.5.  Small volume ascites. 6. There is a fluid attenuation lesion of the pancreatic uncinate measuring 2.4 x 1.3 cm (series 2, image 63), not substantially changed when compared to remote prior MR examination dated 02/23/2012 and likely sequelae of a prior pseudocyst versus incidental pancreatic IPMN.7.  Cardiomegaly.8. Other chronic and incidental findings as detailed above. Aortic Atherosclerosis (ICD10-I70.0)."  Of note since the patient's last visit, pt has had PORTABLE CHEST 1 VIEW (Accession [id number]) completed on 03/06/2019 with results revealing "1. Moderate size right and small left pleural effusions appear not significantly changed since 02/27/19. 2. No new cardiopulmonary abnormality."  Of note since the patient's last visit, pt has had ECHOCARDIOGRAM  completed on 03/06/2019 with results revealing  "1. Left ventricular ejection fraction, by visual estimation, is 60 to 65%. The left ventricle has normal function. Normal left ventricular size. There is no left ventricular hypertrophy. 2. Left ventricular diastolic Doppler parameters are consistent with impaired relaxation pattern of LV diastolic filling. 3. Global right ventricle has mildly reduced systolic function.The right ventricular size is moderately enlarged. No increase in right ventricular wall thickness. 4. Left atrial size was normal.  5. Right atrial size was mildly dilated.  6. Trivial pericardial effusion is present. 7. The mitral valve is normal in structure. Trace mitral valve regurgitation. No evidence of mitral stenosis. 8. The tricuspid valve is normal in structure. Tricuspid valve regurgitation severe. 9. The aortic valve is normal in structure. Aortic valve regurgitation was not visualized by color flow Doppler. Structurally normal aortic valve, with no evidence of sclerosis or stenosis.10. The pulmonic valve was normal in structure. Pulmonic valve regurgitation is mild by color flow Doppler.11. Mildly elevated pulmonary artery systolic pressure.12. The inferior vena cava is normal in size with greater than 50% respiratory variability, suggesting right atrial pressure of 3 mmHg."  Of note since the patient's last visit, pt has had CT ANGIOGRAPHY CHEST WITH CONTRAST (Accession [id number]) completed on 03/06/2019 with results revealing "1. Pulmonary embolus arising from the distal left main pulmonary artery with extension into multiple left lower lobe pulmonary arterial branches. There is also incompletely obstructing pulmonary embolus in the proximal left upper lobe pulmonary artery. More distal pulmonary embolus in the right lower lobe pulmonary artery. Positive for acute PE with CT evidence of right heart strain (RV/LV Ratio = 1.6) consistent with at least submassive (intermediate risk) PE. The presence of right heart strain has been associated with an increased risk of morbidity and mortality. Please activate Code PE by paging [phone]. 2. Sizable pleural effusion on the right with smaller pleural effusion on the left. Consolidation and compressive atelectasis throughout most of the right lower lobe. Milder atelectasis left base. Nodular opacities on the right, likely metastatic foci.3.  Stable adenopathy compared to 1 week prior.4. Enlargement of spleen, incompletely visualized but documented CT 1 week prior. Concern for potential lymphoma. 5. Aortic atherosclerosis.  No aneurysm or dissection evident. Foci of coronary artery calcification noted."  Most recent lab results (03/06/2019) of CBC is as follows: all values are WNL except for Platelets at 72, Eosinophils Absolute at 0.6,Abs Immature Granulocytes at 0.08 .  On review of systems, pt reports rashes, digestive issues and denies back pain, abdominal pain and any other symptoms.   On PMHx the pt reports Hypothyroidism, MCI, Hypercholesterolemia, Tracheal anomaly, Hypertension, Pulmonary nodule, Atherosclerosis of aorta, anxiety, gait disorder, primary insomnia.   INTERVAL HISTORY:  Sarah [name] is a 86 y.o. female here for evaluation and management of Angioimmunoblastic T-cell lymphoma. We are joined today by her daughter, Sarah [name]. The patient's last visit with [place] was on 03/23/2020. The pt reports that she is doing well overall.  The pt reports that she continues experiencing vocal raspiness and anxiety. Pt had some improvement with 0.25 mg of Ativan. Pt notes bad dreams with the Clonazepam, but has only used it once. She is feeling well overall and denies any new concerns.  Of note since the patient's last visit, pt has had PET/CT ([id number]) completed on 04/09/2020 with results revealing "1. No evidence of asymmetric hypermetabolic tissue in the posterior nasopharynx. Physiologic activity noted in lymphoid tissue in the posterior oropharynx/nasopharynx. 2. Resolution of hypermetabolic adenopathy in the RIGHT hilar lymph node and porta hepatis. Deauville 1 3. No evidence progressive lymphoma. 4. Stable RIGHT lung pulmonary nodules."  Lab results today (04/13/20) of CBC w/diff and CMP is as follows: all values are WNL except for WBC at 3.7K. 04/12/2020 LDH at 353  On review of systems, pt reports anxiety and denies fevers, chills, rash, itching, leg swelling, SOB, abdominal pain, constipation and any other symptoms.   MEDICAL HISTORY:  Past Medical History:  Diagnosis Date  .  Allergy    codeine,  thiazides  . Anxiety    new dx  . Arthritis   . Breast cancer ([place]) 03/14/12   bx=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,ER/PR=+,upper inner quad  . Cancer ([place])    breast  . Cataract   . DVT (deep venous thrombosis) ([place]) 02/2019   left leg  . Dyspnea   . Edema    both legs feet and toe, abdomen  . Glaucoma    laser treated years ago  . HOH (hard of hearing)   . Hyperlipemia   . Hypertension   . Hypothyroidism   . Pancreatic cyst    benign  . PONV (postoperative nausea and vomiting)   . Pulmonary embolism ([place]) 02/2019   bilateral   . Radiation 06/11/2012-07/12/2012   17 sessions 4250 cGy, 3 sessions 750 cGy  . Vertigo   . Wears glasses   . Wears partial dentures    partial upper     SURGICAL HISTORY: Past Surgical History:  Procedure Laterality Date  . ABDOMINAL HYSTERECTOMY  1966   1/2 ovary left in   . BREAST SURGERY  1992   lumpectomy-lt  . CATARACT EXTRACTION     b/l  . COLONOSCOPY    . EXCISION MASS NECK Left 03/17/2019    EXCISION MASS NECK (Left Neck)  . EXCISION MASS NECK Left 03/17/2019   Procedure: EXCISION MASS NECK;  Surgeon: Helayne Seminole, MD;  Location: [facility];  Service: ENT;  Laterality: Left;  . EYE SURGERY Bilateral    bilateral cataract removal  . IR IMAGING GUIDED PORT INSERTION  04/23/2019  . JOINT REPLACEMENT  2013   rt total knee  . JOINT REPLACEMENT  1995   lt total knee  . PARTIAL MASTECTOMY WITH NEEDLE LOCALIZATION  04/09/2012   Procedure: PARTIAL MASTECTOMY WITH NEEDLE LOCALIZATION;  Surgeon: Adin Hector, MD;  Location: [facility];  Service: General;  Laterality: Right;  . SKIN BIOPSY Left 03/17/2019   LEFT THIGH  . SKIN BIOPSY Left 03/17/2019   Procedure: Skin Biopsy Left Thigh;  Surgeon: Helayne Seminole, MD;  Location: [facility];  Service: ENT;  Laterality: Left;  . TONSILLECTOMY    . TOTAL KNEE ARTHROPLASTY  08/16/2011   Procedure: TOTAL KNEE ARTHROPLASTY;  Surgeon: Ninetta Lights,  MD;  Location: [facility];  Service: Orthopedics;  Laterality: Right;     SOCIAL HISTORY: Social History   Socioeconomic History  . Marital status: Widowed    Spouse name: Not on file  . Number of children: 2  . Years of education: Not on file  . Highest education level: Not on file  Occupational History  . Occupation: Retired    Comment: Community education officer   Tobacco Use  . Smoking status: Never Smoker  . Smokeless tobacco: Never Used  Vaping Use  . Vaping Use: Never used  Substance and Sexual Activity  . Alcohol use: No  . Drug use: No  . Sexual activity: Not Currently  Other Topics Concern  . Not on file  Social History Narrative  . Not on file   Social Determinants of Health   Financial Resource Strain:   . Difficulty of Paying Living Expenses: Not on file  Food Insecurity:   . Worried About Charity fundraiser in the Last Year: Not on file  . Ran Out of Food in the Last Year: Not on file  Transportation Needs:   . Lack of Transportation (Medical): Not on file  .  Lack of Transportation (Non-Medical): Not on file  Physical Activity:   . Days of Exercise per Week: Not on file  . Minutes of Exercise per Session: Not on file  Stress:   . Feeling of Stress : Not on file  Social Connections:   . Frequency of Communication with Friends and Family: Not on file  . Frequency of Social Gatherings with Friends and Family: Not on file  . Attends Religious Services: Not on file  . Active Member of Clubs or Organizations: Not on file  . Attends Archivist Meetings: Not on file  . Marital Status: Not on file  Intimate Partner Violence:   . Fear of Current or Ex-Partner: Not on file  . Emotionally Abused: Not on file  . Physically Abused: Not on file  . Sexually Abused: Not on file     FAMILY HISTORY: Family History  Problem Relation Age of Onset  . Heart disease Father   . Cancer Maternal Aunt        stomach     ALLERGIES:   is allergic to citalopram,  codeine, latex, and thiazide-type diuretics.   MEDICATIONS:  Current Outpatient Medications  Medication Sig Dispense Refill  . amLODipine (NORVASC) 2.5 MG tablet Take 2.5-5 mg by mouth See admin instructions. 5 mg in the morning, 2.5 mg in the evening    . amoxicillin (AMOXIL) 500 MG capsule Take 4 capsules (2,000 mg total) by mouth once as needed for up to 1 dose. 60 mins prior to dental procedure 4 capsule 0  . apixaban (ELIQUIS) 5 MG TABS tablet Take 1 tablet (5 mg total) by mouth 2 (two) times daily. 180 tablet 1  . benzonatate (TESSALON) 100 MG capsule Take 1 capsule (100 mg total) by mouth 3 (three) times daily as needed for cough. 30 capsule 0  . Cholecalciferol (VITAMIN D) 50 MCG (2000 UT) tablet Take 2,000 Units by mouth 2 (two) times daily.    . clonazepam (KLONOPIN) 0.125 MG disintegrating tablet Take 1 tablet (0.125 mg total) by mouth 2 (two) times daily. (Patient not taking: Reported on 04/13/2020) 60 tablet 0  . Cyanocobalamin (VITAMIN B-12 PO) Take 3,000 mcg by mouth daily.     Marland Kitchen levothyroxine (SYNTHROID) 25 MCG tablet Take 25 mcg by mouth daily before breakfast.     . lidocaine-prilocaine (EMLA) cream Apply to affected area once 30 g 3  . LORazepam (ATIVAN) 0.5 MG tablet TAKE 1/2 TO 1 (ONE-HALF TO ONE) TABLET BY MOUTH EVERY 8 HOURS 30 tablet 0  . Multiple Vitamins-Minerals (PRESERVISION AREDS PO) Take 1 capsule by mouth 2 (two) times daily.     . ondansetron (ZOFRAN) 8 MG tablet Take 1 tablet (8 mg total) by mouth 2 (two) times daily as needed. Start on the third day after chemotherapy. 30 tablet 1  . polyethylene glycol (MIRALAX / GLYCOLAX) 17 g packet Take 17 g by mouth daily.    . prochlorperazine (COMPAZINE) 10 MG tablet Take 1 tablet (10 mg total) by mouth every 6 (six) hours as needed (Nausea or vomiting). 30 tablet 1  . sodium chloride (MURO 128) 5 % ophthalmic ointment Place 1 application into both eyes at bedtime.    Marland Kitchen XIIDRA 5 % SOLN Place 1 drop into both eyes daily as  needed (dry eyes).     Marland Kitchen zolpidem (AMBIEN) 5 MG tablet Take 5 mg by mouth at bedtime as needed for sleep.      No current facility-administered medications for this visit.  REVIEW OF SYSTEMS:   A 10+ POINT REVIEW OF SYSTEMS WAS OBTAINED including neurology, dermatology, psychiatry, cardiac, respiratory, lymph, extremities, GI, GU, Musculoskeletal, constitutional, breasts, reproductive, HEENT.  All pertinent positives are noted in the HPI.  All others are negative.   PHYSICAL EXAMINATION: ECOG FS:1 - Symptomatic but completely ambulatory  Vitals:   04/13/20 0935  BP: 139/77  Pulse: 70  Resp: 18  Temp: (!) 97.1 F (36.2 C)  SpO2: 99%   Wt Readings from Last 3 Encounters:  04/13/20 112 lb 3.2 oz (50.9 kg)  03/23/20 111 lb 14.4 oz (50.8 kg)  03/01/20 112 lb 8 oz (51 kg)   Body mass index is 18.11 kg/m.    Exam was given in a chair   GENERAL:alert, in no acute distress and comfortable SKIN: no acute rashes, no significant lesions EYES: conjunctiva are pink and non-injected, sclera anicteric OROPHARYNX: MMM, no exudates, no oropharyngeal erythema or ulceration NECK: supple, no JVD LYMPH:  no palpable lymphadenopathy in the cervical, axillary or inguinal regions LUNGS: clear to auscultation b/l with normal respiratory effort HEART: regular rate & rhythm ABDOMEN:  normoactive bowel sounds , non tender, not distended. No palpable hepatosplenomegaly.  Extremity: trace pedal edema b/l PSYCH: alert & oriented x 3 with fluent speech NEURO: no focal motor/sensory deficits  LABORATORY DATA:  I have reviewed the data as listed  CBC Latest Ref Rng & Units 04/13/2020 03/23/2020 03/01/2020  WBC 4.0 - 10.5 K/uL 3.7(L) 3.1(L) 3.4(L)  Hemoglobin 12.0 - 15.0 g/dL 13.4 13.6 13.6  Hematocrit 36 - 46 % 39.7 41.3 41.8  Platelets 150 - 400 K/uL 181 199 196    CMP Latest Ref Rng & Units 04/13/2020 03/23/2020 03/01/2020  Glucose 70 - 99 mg/dL 88 91 81  BUN 8 - 23 mg/dL 21 24(H) 17   Creatinine 0.44 - 1.00 mg/dL 0.70 0.77 0.66  Sodium 135 - 145 mmol/L 143 144 140  Potassium 3.5 - 5.1 mmol/L 3.9 4.0 3.8  Chloride 98 - 111 mmol/L 107 108 105  CO2 22 - 32 mmol/L 25 27 29   Calcium 8.9 - 10.3 mg/dL 9.6 9.3 9.8  Total Protein 6.5 - 8.1 g/dL 6.8 6.6 7.0  Total Bilirubin 0.3 - 1.2 mg/dL 0.5 0.5 0.6  Alkaline Phos 38 - 126 U/L 72 74 70  AST 15 - 41 U/L 17 16 16   ALT 0 - 44 U/L 12 12 12    Component     Latest Ref Rng & Units 03/27/2019  Hepatitis B Surface Ag     NON REACTIVE NON REACTIVE  Hep B Core Total Ab     NON REACTIVE NON REACTIVE  HCV Ab     NON REACTIVE NON REACTIVE  LDH     98 - 192 U/L 252 (H)   . Lab Results  Component Value Date   LDH 199 (H) 03/23/2020      04/08/2019 NM PET Image Initial (PI) Skull Base To Thigh (Accession [id number])  04/04/2019 PATHOLOGY   04/07/2019 ECHOCARDIOGRAM  03/28/2019 PATHOLOGY   03/06/2019 CT ANGIOGRAPHY CHEST WITH CONTRAST (Accession [id number])  04/07/2019 ECHOCARDIOGRAM  03/06/2019 ECHOCARDIOGRAM     03/06/2019 PORTABLE CHEST 1 VIEW (Accession [id number])    02/27/2019 CT CHEST, ABDOMEN, AND PELVIS WITH CONTRAST (Accession [id number])    02/17/2019 CHEST XRAY - 2 VIEW (Accession [id number])    RADIOGRAPHIC STUDIES: I have personally reviewed the radiological images as listed and agreed with the findings in the report. NM PET Image Restag (PS) Skull Base To Thigh  Result Date: 04/11/2020 CLINICAL DATA:  Subsequent treatment strategy for T-cell lymphoma. Angio immuno blastic lymphoma. EXAM: NUCLEAR MEDICINE PET SKULL BASE TO THIGH TECHNIQUE: 5.6 mCi F-18 FDG was injected intravenously. Full-ring PET imaging was performed from the skull base to thigh after the radiotracer. CT data was obtained and used for attenuation correction and anatomic localization. Fasting blood glucose: 91 mg/dl COMPARISON:  None. FINDINGS: Mediastinal blood pool activity: SUV max 1.8 Liver activity: SUV max 2.9 NECK:  Symmetric activity in the posterior naso-oropharynx at site of prior recurrence. No clear evidence of residual disease. Moderate symmetric metabolic activity is present in the posterior nasopharynx which is favored benign lymphoid tissue. No new or enlarged hypermetabolic lymph nodes in the neck. Persistent hypermetabolic activity associated with the LEFT lobe of thyroid gland unchanged. Incidental CT findings: none CHEST: Resolution of hypermetabolic activity at the LEFT hilar nodal station. No residual hypermetabolic lymph node remains. No suspicious pulmonary nodules. Incidental CT findings: Peripheral nodule in the RIGHT lower lobe measuring 6 mm unchanged from prior. No radiotracer activity. Similar RIGHT middle lobe nodule measuring 4 mm unchanged. No new pulmonary nodules. ABDOMEN/PELVIS: No metabolic activity the porta hepatis lymph nodes is seen on comparison exam from. No new hypermetabolic adenopathy in the abdomen pelvis. Incidental CT findings: Large LEFT renal cysts. Atherosclerotic calcification of the aorta. SKELETON: No focal hypermetabolic activity to suggest skeletal metastasis. Incidental CT findings: none IMPRESSION: 1. No evidence of asymmetric hypermetabolic tissue in the posterior nasopharynx. Physiologic activity noted in lymphoid tissue in the posterior oropharynx/nasopharynx. 2. Resolution of hypermetabolic adenopathy in the RIGHT hilar lymph node and porta hepatis. Deauville 1 3. No evidence progressive lymphoma. 4. Stable RIGHT lung pulmonary nodules. Electronically Signed   By: Suzy Bouchard M.D.   On: 04/11/2020 18:29     ASSESSMENT & PLAN:   DAUNE DIVIRGILIO is a 86 y.o. female with:  1. 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 SKIN, LEFT THIGH, BIOPSY: - Involvement by angioimmunoblastic T-cell  lymphoma." -04/07/2019 Echocardiogram - normal EF -04/08/2019 NM PET Image Initial (PI) Skull Base To Thigh (Accession [id number]) revealed "1. Widespread hypermetabolic adenopathy in the neck, chest, and abdomen/pelvis, primarily in the Deauville 4 and Deauville 5 range. There is also hypermetabolic activity along the posterior nasopharynx in lingual tonsillar regions potentially indicating sites of involvement. 2. The subtle hypodensities in the spine and bony pelvis are not appreciably hypermetabolic may be incidental, surveillance is suggested. 3. Bilateral thyroid activity but especially on the left. Probably from thyroiditis. 4. A 6 mm subpleural nodule anteriorly in the right lower lobe not appreciably hypermetabolic but is below sensitive PET-CT size thresholds and merits surveillance. 5. Mildly accentuated diffuse splenic activity without splenomegaly or focal splenic lesion identified. 6. Other imaging findings of potential clinical significance: Aortic Atherosclerosis (ICD10-I70.0). Coronary atherosclerosis. Large right and small left pleural effusions. Moderate cardiomegaly. Small pericardial effusion. Mild mesenteric edema. Large left renal cyst. Fullness of both renal collecting systems with suspected nonobstructive left nephrolithiasis. Degenerative grade 1 anterolisthesis at L4-5 at L5-S1. Trace pelvic ascites." -05/28/2019 PET/CT ([id number]) revealed "Complete metabolic response to therapy". -09/16/2019 PET/CT ([id number]) revealed "1. Unfortunately evidence of lymphoma recurrence at sites of previous hypermetabolic adenopathy. Hypermetabolic lymphoid tissue is new from comparison PET-CT 05/28/2019 but at same locations as PET-CT 04/08/2019. The number of metastatic lymph nodes is less than 04/08/2019. Lymphoid tissue at the RIGHT skull base is larger. 2. Three foci  of hypermetabolic recurrence are present, lymphoid tissue at the RIGHT base of skull, hypermetabolic RIGHT hilar lymph node and  hypermetabolic lymph node in the porta hepatis." -11/25/2019 PET/CT ([id number]) revealed "1. Marked interval improvement in the hypermetabolic disease identified previously in the posterior right nasopharynx, compatible with Deauville 4 today. 2. Decreased hypermetabolism in the right hilar and porta hepatis lymph nodes identified previously (Deauville 3). No new sites of hypermetabolic disease in the chest, abdomen, or pelvis. 3. Similar diffuse thyroid uptake, left greater than right. 4. No substantial change in right-sided pulmonary nodules."  2. Pulmonary Embolism -extensive .likely related to extensive malignancy  3. H/o Breast Cancer  -The patient had bilateral diagnostic  mammography at [facility] 07/11/2011. This  showed some calcifications in the right  breast, which seemed a little bit more  prominent than prior. In the left breast  there was an area of possible  architectural distortion. However left  breast ultrasound the same day showed  no abnormality. 6 month followup was  suggested, and on 02/28/2012 the  patient again had bilateral diagnostic  mammography, now with right  ultrasonography. The microcalcifications  in the right breast appeared increased.  Ultrasound showed a hypoechoic lesion  measuring 5 mm, with no associated  shadowing. This had been previously  noted and appeared unchanged. Anterior  to that there was a small hypoechoic  mass measuring 4 mm in diameter.  There was felt to be suspicious, and on  03/14/2012 the patient underwent biopsy  of the right breast mass, showing ([id number]  [id number]) ductal carcinoma in situ,  intermediate grade, estrogen receptor  100% and progesterone receptor 100%  positive.   -Bilateral breast MRI was obtained  03/26/2012. This showed only post  biopsy changes in the right breast,  associated with an area of non-masslike  enhancement measuring 2.4 cm. The  left breast was unremarkable, and there  was no enlarged axillary or internal  mammary adenopathy  noted.  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 The patient's  subsequent history is as detailed below.   PLAN: -Discussed pt labwork today, 04/13/20; blood counts and chemistries are nml, LDH is significantly elevated. 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 -The pt has no prohibitive toxicities from continuing C16 Brentuximab at this time. -Recommend pt use Clonazepam in the morning if she chooses to try it again.and then add at nighttime if tolerated. -Recommend pt continue salt/baking soda gargles & using a humidifier. -Recommend pt go to preventive dose of Eliquis - 2.5 mg BID. -- new prescription sent to her pharmacy -Refill Ativan, Emla cream -Will see back with next cycle of Brentuximab   FOLLOW-UP: Plz schedule next 3 cycles of Brentuximab-Vedotin chemotherapy with portflush, labs and MD visits   The total time spent in the appt was 30 minutes and more than 50% was on counseling and direct patient cares, ordering and management of chemotherapy  All of the patient's questions were answered with apparent satisfaction. The patient knows to call the clinic with any problems, questions or concerns.    Sullivan Lone MD [place] [facility]IV[facility] Hematology/Oncology Physician [facility]  (Office):       [phone] (Work cell):  [phone] (Fax):  [phone]  04/13/2020 10:20 AM   I, Yevette Edwards, am acting as a scribe for Dr. Sullivan Lone.   .I have reviewed the above documentation for accuracy and completeness, and I agree with the above. Brunetta Genera MD

## 2020-04-13 ENCOUNTER — Inpatient Hospital Stay (HOSPITAL_BASED_OUTPATIENT_CLINIC_OR_DEPARTMENT_OTHER): Payer: Medicare Other | Admitting: Hematology

## 2020-04-13 ENCOUNTER — Other Ambulatory Visit: Payer: Self-pay

## 2020-04-13 ENCOUNTER — Inpatient Hospital Stay: Payer: Medicare Other

## 2020-04-13 ENCOUNTER — Other Ambulatory Visit: Payer: Self-pay | Admitting: *Deleted

## 2020-04-13 VITALS — BP 139/77 | HR 70 | Temp 97.1°F | Resp 18 | Ht 66.0 in | Wt 112.2 lb

## 2020-04-13 DIAGNOSIS — Z7189 Other specified counseling: Secondary | ICD-10-CM

## 2020-04-13 DIAGNOSIS — Z5111 Encounter for antineoplastic chemotherapy: Secondary | ICD-10-CM

## 2020-04-13 DIAGNOSIS — Z5112 Encounter for antineoplastic immunotherapy: Secondary | ICD-10-CM | POA: Diagnosis not present

## 2020-04-13 DIAGNOSIS — C8440 Peripheral T-cell lymphoma, not classified, unspecified site: Secondary | ICD-10-CM

## 2020-04-13 DIAGNOSIS — C865 Angioimmunoblastic T-cell lymphoma: Secondary | ICD-10-CM | POA: Diagnosis not present

## 2020-04-13 DIAGNOSIS — Z95828 Presence of other vascular implants and grafts: Secondary | ICD-10-CM

## 2020-04-13 DIAGNOSIS — Z853 Personal history of malignant neoplasm of breast: Secondary | ICD-10-CM | POA: Diagnosis not present

## 2020-04-13 LAB — CBC WITH DIFFERENTIAL/PLATELET
Abs Immature Granulocytes: 0.01 10*3/uL (ref 0.00–0.07)
Basophils Absolute: 0 10*3/uL (ref 0.0–0.1)
Basophils Relative: 1 %
Eosinophils Absolute: 0.1 10*3/uL (ref 0.0–0.5)
Eosinophils Relative: 2 %
HCT: 39.7 % (ref 36.0–46.0)
Hemoglobin: 13.4 g/dL (ref 12.0–15.0)
Immature Granulocytes: 0 %
Lymphocytes Relative: 19 %
Lymphs Abs: 0.7 10*3/uL (ref 0.7–4.0)
MCH: 30 pg (ref 26.0–34.0)
MCHC: 33.8 g/dL (ref 30.0–36.0)
MCV: 89 fL (ref 80.0–100.0)
Monocytes Absolute: 0.5 10*3/uL (ref 0.1–1.0)
Monocytes Relative: 13 %
Neutro Abs: 2.4 10*3/uL (ref 1.7–7.7)
Neutrophils Relative %: 65 %
Platelets: 181 10*3/uL (ref 150–400)
RBC: 4.46 MIL/uL (ref 3.87–5.11)
RDW: 14.8 % (ref 11.5–15.5)
WBC: 3.7 10*3/uL — ABNORMAL LOW (ref 4.0–10.5)
nRBC: 0 % (ref 0.0–0.2)

## 2020-04-13 LAB — CMP (CANCER CENTER ONLY)
ALT: 12 U/L (ref 0–44)
AST: 17 U/L (ref 15–41)
Albumin: 4 g/dL (ref 3.5–5.0)
Alkaline Phosphatase: 72 U/L (ref 38–126)
Anion gap: 11 (ref 5–15)
BUN: 21 mg/dL (ref 8–23)
CO2: 25 mmol/L (ref 22–32)
Calcium: 9.6 mg/dL (ref 8.9–10.3)
Chloride: 107 mmol/L (ref 98–111)
Creatinine: 0.7 mg/dL (ref 0.44–1.00)
GFR, Estimated: >60 mL/min (ref >60)
Glucose, Bld: 88 mg/dL (ref 70–99)
Potassium: 3.9 mmol/L (ref 3.5–5.1)
Sodium: 143 mmol/L (ref 135–145)
Total Bilirubin: 0.5 mg/dL (ref 0.3–1.2)
Total Protein: 6.8 g/dL (ref 6.5–8.1)

## 2020-04-13 LAB — LACTATE DEHYDROGENASE: LDH: 353 U/L — ABNORMAL HIGH (ref 98–192)

## 2020-04-13 MED ORDER — LIDOCAINE-PRILOCAINE 2.5-2.5 % EX CREA: TOPICAL_CREAM | CUTANEOUS | 3 refills | Status: DC

## 2020-04-13 MED ORDER — ACETAMINOPHEN 325 MG PO TABS
ORAL_TABLET | ORAL | Status: AC
  Filled 2020-04-13: qty 2

## 2020-04-13 MED ORDER — FAMOTIDINE IN NACL 20-0.9 MG/50ML-% IV SOLN
20.0000 mg | Freq: Once | INTRAVENOUS | Status: AC
  Administered 2020-04-13: 20 mg via INTRAVENOUS

## 2020-04-13 MED ORDER — ACETAMINOPHEN 325 MG PO TABS
650.0000 mg | ORAL_TABLET | Freq: Once | ORAL | Status: AC
  Administered 2020-04-13: 650 mg via ORAL

## 2020-04-13 MED ORDER — ONDANSETRON HCL 4 MG/2ML IJ SOLN
INTRAMUSCULAR | Status: AC
  Filled 2020-04-13: qty 2

## 2020-04-13 MED ORDER — HEPARIN SOD (PORK) LOCK FLUSH 100 UNIT/ML IV SOLN
500.0000 [IU] | Freq: Once | INTRAVENOUS | Status: AC | PRN
  Administered 2020-04-13: 500 [IU]
  Filled 2020-04-13: qty 5

## 2020-04-13 MED ORDER — LORAZEPAM 0.5 MG PO TABS: ORAL_TABLET | ORAL | 0 refills | Status: DC

## 2020-04-13 MED ORDER — SODIUM CHLORIDE 0.9% FLUSH
10.0000 mL | INTRAVENOUS | Status: DC | PRN
  Administered 2020-04-13: 10 mL
  Filled 2020-04-13: qty 10

## 2020-04-13 MED ORDER — ONDANSETRON HCL 4 MG/2ML IJ SOLN
4.0000 mg | Freq: Once | INTRAMUSCULAR | Status: AC
  Administered 2020-04-13: 4 mg via INTRAVENOUS

## 2020-04-13 MED ORDER — FAMOTIDINE IN NACL 20-0.9 MG/50ML-% IV SOLN
INTRAVENOUS | Status: AC
  Filled 2020-04-13: qty 50

## 2020-04-13 MED ORDER — DIPHENHYDRAMINE HCL 50 MG/ML IJ SOLN
INTRAMUSCULAR | Status: AC
  Filled 2020-04-13: qty 1

## 2020-04-13 MED ORDER — DEXAMETHASONE SODIUM PHOSPHATE 10 MG/ML IJ SOLN
5.0000 mg | Freq: Once | INTRAMUSCULAR | Status: AC
  Administered 2020-04-13: 5 mg via INTRAVENOUS

## 2020-04-13 MED ORDER — SODIUM CHLORIDE 0.9 % IV SOLN
Freq: Once | INTRAVENOUS | Status: AC
  Filled 2020-04-13: qty 250

## 2020-04-13 MED ORDER — SODIUM CHLORIDE 0.9% FLUSH
10.0000 mL | Freq: Once | INTRAVENOUS | Status: AC
  Administered 2020-04-13: 10 mL
  Filled 2020-04-13: qty 10

## 2020-04-13 MED ORDER — DIPHENHYDRAMINE HCL 50 MG/ML IJ SOLN
50.0000 mg | Freq: Once | INTRAMUSCULAR | Status: AC
  Administered 2020-04-13: 50 mg via INTRAVENOUS

## 2020-04-13 MED ORDER — SODIUM CHLORIDE 0.9 % IV SOLN
1.8000 mg/kg | Freq: Once | INTRAVENOUS | Status: AC
  Administered 2020-04-13: 100 mg via INTRAVENOUS
  Filled 2020-04-13: qty 20

## 2020-04-13 NOTE — Progress Notes (Signed)
Pt discharged in no apparent distress. Pt left ambulatory with assistance.  Pt aware of discharge instructions and verbalized understanding and had no further questions.

## 2020-04-13 NOTE — Patient Instructions (Signed)
[place] Discharge Instructions for Patients Receiving Chemotherapy  Today you received the following chemotherapy agents brentuximab  To help prevent nausea and vomiting after your treatment, we encourage you to take your nausea medication as directed. If you develop nausea and vomiting that is not controlled by your nausea medication, call the clinic.   BELOW ARE SYMPTOMS THAT SHOULD BE REPORTED IMMEDIATELY:  *FEVER GREATER THAN 100.5 F  *CHILLS WITH OR WITHOUT FEVER  NAUSEA AND VOMITING THAT IS NOT CONTROLLED WITH YOUR NAUSEA MEDICATION  *UNUSUAL SHORTNESS OF BREATH  *UNUSUAL BRUISING OR BLEEDING  TENDERNESS IN MOUTH AND THROAT WITH OR WITHOUT PRESENCE OF ULCERS  *URINARY PROBLEMS  *BOWEL PROBLEMS  UNUSUAL RASH Items with * indicate a potential emergency and should be followed up as soon as possible.  Feel free to call the clinic should you have any questions or concerns. The clinic phone number is [phone].  Please show the [place] at check-in to the Emergency Department and triage nurse.

## 2020-04-14 MED ORDER — APIXABAN 2.5 MG PO TABS: 2.5000 mg | ORAL_TABLET | Freq: Two times a day (BID) | ORAL | 5 refills | Status: DC

## 2020-04-30 ENCOUNTER — Telehealth: Payer: Self-pay | Admitting: Hematology

## 2020-04-30 NOTE — Telephone Encounter (Signed)
Scheduled per 11/23 los, patient has been called and notified.

## 2020-05-04 ENCOUNTER — Inpatient Hospital Stay: Payer: Medicare Other

## 2020-05-04 ENCOUNTER — Inpatient Hospital Stay: Payer: Medicare Other | Admitting: Hematology

## 2020-05-04 ENCOUNTER — Other Ambulatory Visit: Payer: Medicare Other

## 2020-05-04 ENCOUNTER — Telehealth: Payer: Self-pay | Admitting: *Deleted

## 2020-05-04 DIAGNOSIS — Z20822 Contact with and (suspected) exposure to covid-19: Secondary | ICD-10-CM | POA: Diagnosis not present

## 2020-05-04 NOTE — Telephone Encounter (Signed)
Patient presented with son to [facility] for lab/MD/infusion appt. Travel screening revealed patient exposed to someone with known Covid diagnosis on Saturday. Patient denies headache or upper respiratory or gastrointestinal symptoms. Patient has had both Covid vaccines and booster.  Dr.Kale informed.  Instructed patient and son per Dr. Irene Limbo: Appts for [facility] today will be cancelled. Appointments for today will be rescheduled in 7 days. Patient needs to be tested for Covid after 5 days exposure - 12/15 - and notify office of results. All appointments in treatment plan will be rescheduled going forward. Schedule message sent.  Gave patient following info from [employer] site: For assistance in scheduling a COVID-19 test, please call [phone] with calendar of testing locations.

## 2020-05-05 ENCOUNTER — Telehealth: Payer: Self-pay | Admitting: Hematology

## 2020-05-05 NOTE — Telephone Encounter (Signed)
Rescheduled appointments a week out per provider, called patient's daughter regarding upcoming rescheduled appointments. Patients will be notified.

## 2020-05-11 ENCOUNTER — Other Ambulatory Visit: Payer: Self-pay

## 2020-05-11 ENCOUNTER — Inpatient Hospital Stay: Payer: Medicare Other

## 2020-05-11 ENCOUNTER — Inpatient Hospital Stay: Payer: Medicare Other | Attending: Hematology

## 2020-05-11 ENCOUNTER — Inpatient Hospital Stay (HOSPITAL_BASED_OUTPATIENT_CLINIC_OR_DEPARTMENT_OTHER): Payer: Medicare Other | Admitting: Hematology

## 2020-05-11 VITALS — BP 123/84 | HR 74 | Temp 97.4°F | Resp 18 | Ht 66.0 in | Wt 113.9 lb

## 2020-05-11 DIAGNOSIS — I2699 Other pulmonary embolism without acute cor pulmonale: Secondary | ICD-10-CM | POA: Insufficient documentation

## 2020-05-11 DIAGNOSIS — C865 Angioimmunoblastic T-cell lymphoma: Secondary | ICD-10-CM | POA: Insufficient documentation

## 2020-05-11 DIAGNOSIS — C8440 Peripheral T-cell lymphoma, not classified, unspecified site: Secondary | ICD-10-CM | POA: Diagnosis not present

## 2020-05-11 DIAGNOSIS — Z95828 Presence of other vascular implants and grafts: Secondary | ICD-10-CM

## 2020-05-11 DIAGNOSIS — Z5111 Encounter for antineoplastic chemotherapy: Secondary | ICD-10-CM

## 2020-05-11 DIAGNOSIS — Z5112 Encounter for antineoplastic immunotherapy: Secondary | ICD-10-CM | POA: Insufficient documentation

## 2020-05-11 DIAGNOSIS — Z853 Personal history of malignant neoplasm of breast: Secondary | ICD-10-CM | POA: Insufficient documentation

## 2020-05-11 DIAGNOSIS — Z7189 Other specified counseling: Secondary | ICD-10-CM

## 2020-05-11 DIAGNOSIS — Z7901 Long term (current) use of anticoagulants: Secondary | ICD-10-CM | POA: Insufficient documentation

## 2020-05-11 LAB — CBC WITH DIFFERENTIAL/PLATELET
Abs Immature Granulocytes: 0.01 10*3/uL (ref 0.00–0.07)
Basophils Absolute: 0 10*3/uL (ref 0.0–0.1)
Basophils Relative: 1 %
Eosinophils Absolute: 0.1 10*3/uL (ref 0.0–0.5)
Eosinophils Relative: 1 %
HCT: 41 % (ref 36.0–46.0)
Hemoglobin: 13.7 g/dL (ref 12.0–15.0)
Immature Granulocytes: 0 %
Lymphocytes Relative: 14 %
Lymphs Abs: 0.7 10*3/uL (ref 0.7–4.0)
MCH: 29.9 pg (ref 26.0–34.0)
MCHC: 33.4 g/dL (ref 30.0–36.0)
MCV: 89.5 fL (ref 80.0–100.0)
Monocytes Absolute: 0.5 10*3/uL (ref 0.1–1.0)
Monocytes Relative: 9 %
Neutro Abs: 3.9 10*3/uL (ref 1.7–7.7)
Neutrophils Relative %: 75 %
Platelets: 214 10*3/uL (ref 150–400)
RBC: 4.58 MIL/uL (ref 3.87–5.11)
RDW: 14.7 % (ref 11.5–15.5)
WBC: 5.2 10*3/uL (ref 4.0–10.5)
nRBC: 0 % (ref 0.0–0.2)

## 2020-05-11 LAB — CMP (CANCER CENTER ONLY)
ALT: 15 U/L (ref 0–44)
AST: 18 U/L (ref 15–41)
Albumin: 4 g/dL (ref 3.5–5.0)
Alkaline Phosphatase: 101 U/L (ref 38–126)
Anion gap: 9 (ref 5–15)
BUN: 31 mg/dL — ABNORMAL HIGH (ref 8–23)
CO2: 29 mmol/L (ref 22–32)
Calcium: 9.9 mg/dL (ref 8.9–10.3)
Chloride: 105 mmol/L (ref 98–111)
Creatinine: 0.82 mg/dL (ref 0.44–1.00)
GFR, Estimated: >60 mL/min (ref >60)
Glucose, Bld: 124 mg/dL — ABNORMAL HIGH (ref 70–99)
Potassium: 4 mmol/L (ref 3.5–5.1)
Sodium: 143 mmol/L (ref 135–145)
Total Bilirubin: 0.6 mg/dL (ref 0.3–1.2)
Total Protein: 7.1 g/dL (ref 6.5–8.1)

## 2020-05-11 LAB — LACTATE DEHYDROGENASE: LDH: 276 U/L — ABNORMAL HIGH (ref 98–192)

## 2020-05-11 MED ORDER — SODIUM CHLORIDE 0.9 % IV SOLN
Freq: Once | INTRAVENOUS | Status: AC
  Filled 2020-05-11: qty 250

## 2020-05-11 MED ORDER — ACETAMINOPHEN 325 MG PO TABS
ORAL_TABLET | ORAL | Status: AC
  Filled 2020-05-11: qty 2

## 2020-05-11 MED ORDER — FAMOTIDINE IN NACL 20-0.9 MG/50ML-% IV SOLN
INTRAVENOUS | Status: AC
  Filled 2020-05-11: qty 50

## 2020-05-11 MED ORDER — METHYLPREDNISOLONE SODIUM SUCC 40 MG IJ SOLR
40.0000 mg | Freq: Once | INTRAMUSCULAR | Status: AC
  Administered 2020-05-11: 40 mg via INTRAVENOUS

## 2020-05-11 MED ORDER — DEXAMETHASONE SODIUM PHOSPHATE 10 MG/ML IJ SOLN
5.0000 mg | Freq: Once | INTRAMUSCULAR | Status: AC
  Administered 2020-05-11: 5 mg via INTRAVENOUS

## 2020-05-11 MED ORDER — FAMOTIDINE IN NACL 20-0.9 MG/50ML-% IV SOLN
20.0000 mg | Freq: Once | INTRAVENOUS | Status: AC
  Administered 2020-05-11: 20 mg via INTRAVENOUS

## 2020-05-11 MED ORDER — HEPARIN SOD (PORK) LOCK FLUSH 100 UNIT/ML IV SOLN
500.0000 [IU] | Freq: Once | INTRAVENOUS | Status: AC | PRN
  Administered 2020-05-11: 500 [IU]
  Filled 2020-05-11: qty 5

## 2020-05-11 MED ORDER — DIPHENHYDRAMINE HCL 50 MG/ML IJ SOLN
INTRAMUSCULAR | Status: AC
  Filled 2020-05-11: qty 1

## 2020-05-11 MED ORDER — ONDANSETRON HCL 4 MG/2ML IJ SOLN
4.0000 mg | Freq: Once | INTRAMUSCULAR | Status: AC
  Administered 2020-05-11: 4 mg via INTRAVENOUS

## 2020-05-11 MED ORDER — DIPHENHYDRAMINE HCL 50 MG/ML IJ SOLN
50.0000 mg | Freq: Once | INTRAMUSCULAR | Status: AC
  Administered 2020-05-11: 50 mg via INTRAVENOUS

## 2020-05-11 MED ORDER — SODIUM CHLORIDE 0.9% FLUSH
10.0000 mL | INTRAVENOUS | Status: DC | PRN
  Administered 2020-05-11: 10 mL
  Filled 2020-05-11: qty 10

## 2020-05-11 MED ORDER — ACETAMINOPHEN 325 MG PO TABS
650.0000 mg | ORAL_TABLET | Freq: Once | ORAL | Status: AC
  Administered 2020-05-11: 650 mg via ORAL

## 2020-05-11 MED ORDER — METHYLPREDNISOLONE SODIUM SUCC 40 MG IJ SOLR
INTRAMUSCULAR | Status: AC
  Filled 2020-05-11: qty 1

## 2020-05-11 MED ORDER — DEXAMETHASONE SODIUM PHOSPHATE 10 MG/ML IJ SOLN
INTRAMUSCULAR | Status: AC
  Filled 2020-05-11: qty 1

## 2020-05-11 MED ORDER — SODIUM CHLORIDE 0.9% FLUSH
10.0000 mL | Freq: Once | INTRAVENOUS | Status: AC
  Administered 2020-05-11: 10 mL
  Filled 2020-05-11: qty 10

## 2020-05-11 MED ORDER — SODIUM CHLORIDE 0.9 % IV SOLN
1.8000 mg/kg | Freq: Once | INTRAVENOUS | Status: AC
  Administered 2020-05-11: 100 mg via INTRAVENOUS
  Filled 2020-05-11: qty 20

## 2020-05-11 MED ORDER — ONDANSETRON HCL 4 MG/2ML IJ SOLN
INTRAMUSCULAR | Status: AC
  Filled 2020-05-11: qty 2

## 2020-05-11 NOTE — Progress Notes (Signed)
HEMATOLOGY/ONCOLOGY CLINIC NOTE  Date of Service: 05/11/2020  Patient Care Team: Lajean Manes, MD as PCP - General (Internal Medicine) Buford Dresser, MD as PCP - Cardiology (Cardiology)  REFERRING PHYSICIAN: Lajean Manes, MD  CHIEF COMPLAINTS/PURPOSE OF CONSULTATION:  Continue mx of Recently Diagnosed Angioimmunoblastic T cell lymphoma      HISTORY OF PRESENTING ILLNESS:   Sarah [name] is a wonderful 86 y.o. female who has been referred to [place] by Lajean Manes, MD for evaluation and management of Possible lymphoma . She is accompanied today by [name] . The pt reports that she is doing well overall.   Pending Surgical pathology from 03/17/2019 Dr.Manny will contact clinic about pathology   August 25th/27th she began coughing and sneezing. On Sept 3 went to doctors and they said it was allergies. She began having rashes (little red dots). Went to PCP and they prescribed prednisone and for several days and the medication helped. But as soon as she completed the prescription symptoms reoccurred.   She had a chest X Ray done because she was having a severe cough on 02/17/2019 revealing "bilateral effusions in this patient with history of breast cancer,of uncertain significance with question of right lower lobe pulmonary nodule, consider chest CT for further assessment. Atherosclerotic changes in the thoracic aorta."  Dr. Amy Martinique biopsied the rashes on her back 02/21/2019 and it was determined that it could be because of an allergy to medication. Dr.Stoneking stopped her blood pressure medication from September- October but ther rash still present. The itching started 1 to 2 months ago. The rash would come and go and was mostly on her back. She still has rash on her left thigh.  She is using triaconazole and cetrizine.  She has lumps behind ear and abdomin and she states that they have swollen twice the size within 1 and half months  She has no appetite and  has changes in her taste that began around August. She has changes in bowels. They are more loose and smaller In size. She also has no control over bowels.  She feels fatigued and it has increased since August.   She has a cough that starts in September  that has made her hoarse.   She had a CT scan on 02/27/2019 because she was having severe cough and her stomach was very extended. Revealing "1. Extensive lymphadenopathy throughout the chest, abdomen, and pelvis, with index lymph nodes identified above. Splenomegaly. Findings are most consistent with lymphoma with recurrent, metastatic breast malignancy less favored. 2. There is generally osteopenic appearance of the skeleton. There are numerous subtle hypodense lesions, particularly of the pelvis (e.g. Series 2, image 88) and select vertebral bodies, for example L4 (series 8, image 92). This is suspicious although not definitive for osseous metastatic disease. Characterization for metabolic activity by nuclear scintigraphic bone scan or PET-CT would be helpful to further evaluate. 3. Moderate right, small left pleural effusions with associated atelectasis or consolidation. Subpleural radiation fibrosis of the anterior right lung. 4. There is a new subpleural pulmonary nodule of the anterior right middle lobe measuring 6 mm (series 4, image 105), nonspecific. There is no obvious pleural thickening or nodularity definitive for metastatic disease. 5.  Small volume ascites. 6. There is a fluid attenuation lesion of the pancreatic uncinate measuring 2.4 x 1.3 cm (series 2, image 63), not substantially changed when compared to remote prior MR examination dated 02/23/2012 and likely sequelae of a prior pseudocyst versus incidental pancreatic IPMN. 7.  Cardiomegaly."  Hospitalized from 03/06/2019-03/08/2019. She presenting with acute shortness of breath, leg swelling,left hand swelling and some weight gain despite poor appetite. In ED, CTA chest revealed at least  submassive PE, moderate right pleural effusion, diffuse LAD concerning for lymphoma. Started on heparin drip. PCCM consulted for guidance. She had thoracocentesis with removal of 1200 cc cloudy exudative pleural fluid. Cardiology consulted for elevated which was thought to be demand ischemia from right heart strain in the setting of PE. Patient was transitioned to subcu Lovenox by pulmonology the next day and remained stable. Unusual appearance of tracheal air column at the thoracic inlet. Correlate with any history of swallowing difficulty or changes in voice with dedicated imaging with chest CT and or CT of the neck as indicated.  She has a nonhealing ulcer on her right tibialis anterior that starting weeping one week ago. Of note since the patient's last visit, pt has had CHEST XRAY - 2 VIEW (Accession [id number]) completed on 02/17/2019 with results revealing "Bilateral effusions in this patient with history of breast cancer,of uncertain significance with question of right lower lobepulmonary nodule, consider chest CT for furtherassessment.Unusual appearance of tracheal air column at the thoracic inlet.Correlate with any history of swallowing difficulty or changes in voice with dedicated imaging with chest CT and or CT of the neck as Indicated. Atherosclerotic changes in the thoracic aorta."  Of note since the patient's last visit, pt has had CT CHEST, ABDOMEN, AND PELVIS WITH CONTRAST (Accession [id number]) completed on 02/27/2019 with results revealing "1. Extensive lymphadenopathy throughout the chest, abdomen, and pelvis, with index lymph nodes identified above. Splenomegaly.Findings are most consistent with lymphoma with recurrent, metastatic breast malignancy less favored. 2. There is generally osteopenic appearance of the skeleton. There are numerous subtle hypodense lesions, particularly of the pelvis (e.g. Series 2, image 88) and select vertebral bodies, for example L4 (series 8, image 92).  This is suspicious although not definitive for osseous metastatic disease. Characterization for metabolic activity by nuclear scintigraphic bone scan or PET-CT would be helpful to further evaluate.  3. Moderate right, small left pleural effusions with associated atelectasis or consolidation. Subpleural radiation fibrosis of the anterior right lung.4. There is a new subpleural pulmonary nodule of the anterior right middle lobe measuring 6 mm (series 4, image 105), nonspecific. There is no obvious pleural thickening or nodularity definitive for metastatic disease.5.  Small volume ascites. 6. There is a fluid attenuation lesion of the pancreatic uncinate measuring 2.4 x 1.3 cm (series 2, image 63), not substantially changed when compared to remote prior MR examination dated 02/23/2012 and likely sequelae of a prior pseudocyst versus incidental pancreatic IPMN.7.  Cardiomegaly.8. Other chronic and incidental findings as detailed above. Aortic Atherosclerosis (ICD10-I70.0)."  Of note since the patient's last visit, pt has had PORTABLE CHEST 1 VIEW (Accession [id number]) completed on 03/06/2019 with results revealing "1. Moderate size right and small left pleural effusions appear not significantly changed since 02/27/19. 2. No new cardiopulmonary abnormality."  Of note since the patient's last visit, pt has had ECHOCARDIOGRAM  completed on 03/06/2019 with results revealing  "1. Left ventricular ejection fraction, by visual estimation, is 60 to 65%. The left ventricle has normal function. Normal left ventricular size. There is no left ventricular hypertrophy. 2. Left ventricular diastolic Doppler parameters are consistent with impaired relaxation pattern of LV diastolic filling. 3. Global right ventricle has mildly reduced systolic function.The right ventricular size is moderately enlarged. No increase in right ventricular wall thickness. 4. Left atrial size was normal.  5. Right atrial size was mildly dilated.  6. Trivial pericardial effusion is present. 7. The mitral valve is normal in structure. Trace mitral valve regurgitation. No evidence of mitral stenosis. 8. The tricuspid valve is normal in structure. Tricuspid valve regurgitation severe. 9. The aortic valve is normal in structure. Aortic valve regurgitation was not visualized by color flow Doppler. Structurally normal aortic valve, with no evidence of sclerosis or stenosis.10. The pulmonic valve was normal in structure. Pulmonic valve regurgitation is mild by color flow Doppler.11. Mildly elevated pulmonary artery systolic pressure.12. The inferior vena cava is normal in size with greater than 50% respiratory variability, suggesting right atrial pressure of 3 mmHg."  Of note since the patient's last visit, pt has had CT ANGIOGRAPHY CHEST WITH CONTRAST (Accession [id number]) completed on 03/06/2019 with results revealing "1. Pulmonary embolus arising from the distal left main pulmonary artery with extension into multiple left lower lobe pulmonary arterial branches. There is also incompletely obstructing pulmonary embolus in the proximal left upper lobe pulmonary artery. More distal pulmonary embolus in the right lower lobe pulmonary artery. Positive for acute PE with CT evidence of right heart strain (RV/LV Ratio = 1.6) consistent with at least submassive (intermediate risk) PE. The presence of right heart strain has been associated with an increased risk of morbidity and mortality. Please activate Code PE by paging [phone]. 2. Sizable pleural effusion on the right with smaller pleural effusion on the left. Consolidation and compressive atelectasis throughout most of the right lower lobe. Milder atelectasis left base. Nodular opacities on the right, likely metastatic foci.3.  Stable adenopathy compared to 1 week prior.4. Enlargement of spleen, incompletely visualized but documented CT 1 week prior. Concern for potential lymphoma. 5. Aortic atherosclerosis.  No aneurysm or dissection evident. Foci of coronary artery calcification noted."  Most recent lab results (03/06/2019) of CBC is as follows: all values are WNL except for Platelets at 72, Eosinophils Absolute at 0.6,Abs Immature Granulocytes at 0.08 .  On review of systems, pt reports rashes, digestive issues and denies back pain, abdominal pain and any other symptoms.   On PMHx the pt reports Hypothyroidism, MCI, Hypercholesterolemia, Tracheal anomaly, Hypertension, Pulmonary nodule, Atherosclerosis of aorta, anxiety, gait disorder, primary insomnia.   INTERVAL HISTORY:  Sarah [name] is a 86 y.o. female here for evaluation and management of Angioimmunoblastic T-cell lymphoma. We are joined today by her son. The patient's last visit with [place] was on 04/13/2020. The pt reports that she is doing well overall.  The pt reports that she has been fatigued, lightheaded, anxious, and experiencing mental fogginess. She is taking Ativan as prescribed for her anxiety but is feeling more pressure due to the holidays and preparing for upcoming gatherings. Pt tested negative for COVID19 last Tuesday at an [facility] walk-in clinic.   Lab results today (05/11/20) of CBC w/diff and CMP is as follows: all values are WNL except for Glucose at 124, BUN at 31. 05/11/2020 LDH at 276  On review of systems, pt reports fatigue, anxiety and denies fevers, chills, night sweats, rash, abdominal pain, leg swelling, tingling/numbness in hands/feet, N/V/D, redness/peeling in hands/feet and any other symptoms.   MEDICAL HISTORY:  Past Medical History:  Diagnosis Date  . Allergy    codeine, thiazides  . Anxiety    new dx  . Arthritis   . Breast cancer ([place]) 03/14/12   bx=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,ER/PR=+,upper inner quad  . Cancer ([place])    breast  . Cataract   .  DVT (deep venous thrombosis) ([place]) 02/2019   left leg  . Dyspnea   . Edema    both legs feet and toe, abdomen  .  Glaucoma    laser treated years ago  . HOH (hard of hearing)   . Hyperlipemia   . Hypertension   . Hypothyroidism   . Pancreatic cyst    benign  . PONV (postoperative nausea and vomiting)   . Pulmonary embolism ([place]) 02/2019   bilateral   . Radiation 06/11/2012-07/12/2012   17 sessions 4250 cGy, 3 sessions 750 cGy  . Vertigo   . Wears glasses   . Wears partial dentures    partial upper     SURGICAL HISTORY: Past Surgical History:  Procedure Laterality Date  . ABDOMINAL HYSTERECTOMY  1966   1/2 ovary left in   . BREAST SURGERY  1992   lumpectomy-lt  . CATARACT EXTRACTION     b/l  . COLONOSCOPY    . EXCISION MASS NECK Left 03/17/2019    EXCISION MASS NECK (Left Neck)  . EXCISION MASS NECK Left 03/17/2019   Procedure: EXCISION MASS NECK;  Surgeon: Helayne Seminole, MD;  Location: [place];  Service: ENT;  Laterality: Left;  . EYE SURGERY Bilateral    bilateral cataract removal  . IR IMAGING GUIDED PORT INSERTION  04/23/2019  . JOINT REPLACEMENT  2013   rt total knee  . JOINT REPLACEMENT  1995   lt total knee  . PARTIAL MASTECTOMY WITH NEEDLE LOCALIZATION  04/09/2012   Procedure: PARTIAL MASTECTOMY WITH NEEDLE LOCALIZATION;  Surgeon: Adin Hector, MD;  Location: [place];  Service: General;  Laterality: Right;  . SKIN BIOPSY Left 03/17/2019   LEFT THIGH  . SKIN BIOPSY Left 03/17/2019   Procedure: Skin Biopsy Left Thigh;  Surgeon: Helayne Seminole, MD;  Location: [place];  Service: ENT;  Laterality: Left;  . TONSILLECTOMY    . TOTAL KNEE ARTHROPLASTY  08/16/2011   Procedure: TOTAL KNEE ARTHROPLASTY;  Surgeon: Ninetta Lights, MD;  Location: [place];  Service: Orthopedics;  Laterality: Right;     SOCIAL HISTORY: Social History   Socioeconomic History  . Marital status: Widowed    Spouse name: Not on file  . Number of children: 2  . Years of education: Not on file  . Highest education level: Not on file  Occupational History  . Occupation:  Retired    Comment: Community education officer   Tobacco Use  . Smoking status: Never Smoker  . Smokeless tobacco: Never Used  Vaping Use  . Vaping Use: Never used  Substance and Sexual Activity  . Alcohol use: No  . Drug use: No  . Sexual activity: Not Currently  Other Topics Concern  . Not on file  Social History Narrative  . Not on file   Social Determinants of Health   Financial Resource Strain: Not on file  Food Insecurity: Not on file  Transportation Needs: Not on file  Physical Activity: Not on file  Stress: Not on file  Social Connections: Not on file  Intimate Partner Violence: Not on file     FAMILY HISTORY: Family History  Problem Relation Age of Onset  . Heart disease Father   . Cancer Maternal Aunt        stomach     ALLERGIES:   is allergic to citalopram, codeine, latex, and thiazide-type diuretics.   MEDICATIONS:  Current Outpatient Medications  Medication Sig Dispense Refill  . amLODipine (NORVASC) 2.5 MG  tablet Take 2.5-5 mg by mouth See admin instructions. 5 mg in the morning, 2.5 mg in the evening    . amoxicillin (AMOXIL) 500 MG capsule Take 4 capsules (2,000 mg total) by mouth once as needed for up to 1 dose. 60 mins prior to dental procedure 4 capsule 0  . apixaban (ELIQUIS) 2.5 MG TABS tablet Take 1 tablet (2.5 mg total) by mouth 2 (two) times daily. 60 tablet 5  . benzonatate (TESSALON) 100 MG capsule Take 1 capsule (100 mg total) by mouth 3 (three) times daily as needed for cough. 30 capsule 0  . Cholecalciferol (VITAMIN D) 50 MCG (2000 UT) tablet Take 2,000 Units by mouth 2 (two) times daily.    . clonazepam (KLONOPIN) 0.125 MG disintegrating tablet Take 1 tablet (0.125 mg total) by mouth 2 (two) times daily. (Patient not taking: Reported on 04/13/2020) 60 tablet 0  . Cyanocobalamin (VITAMIN B-12 PO) Take 3,000 mcg by mouth daily.     Marland Kitchen levothyroxine (SYNTHROID) 25 MCG tablet Take 25 mcg by mouth daily before breakfast.     . lidocaine-prilocaine  (EMLA) cream Apply to affected area once 30 g 3  . LORazepam (ATIVAN) 0.5 MG tablet TAKE 1/2 TO 1 (ONE-HALF TO ONE) TABLET BY MOUTH EVERY 8 HOURS 30 tablet 0  . Multiple Vitamins-Minerals (PRESERVISION AREDS PO) Take 1 capsule by mouth 2 (two) times daily.     . ondansetron (ZOFRAN) 8 MG tablet Take 1 tablet (8 mg total) by mouth 2 (two) times daily as needed. Start on the third day after chemotherapy. 30 tablet 1  . polyethylene glycol (MIRALAX / GLYCOLAX) 17 g packet Take 17 g by mouth daily.    . prochlorperazine (COMPAZINE) 10 MG tablet Take 1 tablet (10 mg total) by mouth every 6 (six) hours as needed (Nausea or vomiting). 30 tablet 1  . sodium chloride (MURO 128) 5 % ophthalmic ointment Place 1 application into both eyes at bedtime.    Marland Kitchen XIIDRA 5 % SOLN Place 1 drop into both eyes daily as needed (dry eyes).     Marland Kitchen zolpidem (AMBIEN) 5 MG tablet Take 5 mg by mouth at bedtime as needed for sleep.      No current facility-administered medications for this visit.     REVIEW OF SYSTEMS:   A 10+ POINT REVIEW OF SYSTEMS WAS OBTAINED including neurology, dermatology, psychiatry, cardiac, respiratory, lymph, extremities, GI, GU, Musculoskeletal, constitutional, breasts, reproductive, HEENT.  All pertinent positives are noted in the HPI.  All others are negative.   PHYSICAL EXAMINATION: ECOG FS:1 - Symptomatic but completely ambulatory  There were no vitals filed for this visit. Wt Readings from Last 3 Encounters:  04/13/20 112 lb 3.2 oz (50.9 kg)  03/23/20 111 lb 14.4 oz (50.8 kg)  03/01/20 112 lb 8 oz (51 kg)   Body mass index is 18.38 kg/m.    Exam was given in a chair   GENERAL:alert, in no acute distress and comfortable SKIN: no acute rashes, no significant lesions EYES: conjunctiva are pink and non-injected, sclera anicteric OROPHARYNX: MMM, no exudates, no oropharyngeal erythema or ulceration NECK: supple, no JVD LYMPH:  no palpable lymphadenopathy in the cervical, axillary or  inguinal regions LUNGS: clear to auscultation b/l with normal respiratory effort HEART: regular rate & rhythm ABDOMEN:  normoactive bowel sounds , non tender, not distended. No palpable hepatosplenomegaly.  Extremity: no pedal edema PSYCH: alert & oriented x 3 with fluent speech NEURO: no focal motor/sensory deficits  LABORATORY DATA:  I have reviewed the data as listed  CBC Latest Ref Rng & Units 05/11/2020 04/13/2020 03/23/2020  WBC 4.0 - 10.5 K/uL 5.2 3.7(L) 3.1(L)  Hemoglobin 12.0 - 15.0 g/dL 13.7 13.4 13.6  Hematocrit 36.0 - 46.0 % 41.0 39.7 41.3  Platelets 150 - 400 K/uL 214 181 199    CMP Latest Ref Rng & Units 05/11/2020 04/13/2020 03/23/2020  Glucose 70 - 99 mg/dL 124(H) 88 91  BUN 8 - 23 mg/dL 31(H) 21 24(H)  Creatinine 0.44 - 1.00 mg/dL 0.82 0.70 0.77  Sodium 135 - 145 mmol/L 143 143 144  Potassium 3.5 - 5.1 mmol/L 4.0 3.9 4.0  Chloride 98 - 111 mmol/L 105 107 108  CO2 22 - 32 mmol/L 29 25 27   Calcium 8.9 - 10.3 mg/dL 9.9 9.6 9.3  Total Protein 6.5 - 8.1 g/dL 7.1 6.8 6.6  Total Bilirubin 0.3 - 1.2 mg/dL 0.6 0.5 0.5  Alkaline Phos 38 - 126 U/L 101 72 74  AST 15 - 41 U/L 18 17 16   ALT 0 - 44 U/L 15 12 12    Component     Latest Ref Rng & Units 03/27/2019  Hepatitis B Surface Ag     NON REACTIVE NON REACTIVE  Hep B Core Total Ab     NON REACTIVE NON REACTIVE  HCV Ab     NON REACTIVE NON REACTIVE  LDH     98 - 192 U/L 252 (H)   . Lab Results  Component Value Date   LDH 276 (H) 05/11/2020      04/08/2019 NM PET Image Initial (PI) Skull Base To Thigh (Accession [id number])  04/04/2019 PATHOLOGY   04/07/2019 ECHOCARDIOGRAM  03/28/2019 PATHOLOGY   03/06/2019 CT ANGIOGRAPHY CHEST WITH CONTRAST (Accession [id number])  04/07/2019 ECHOCARDIOGRAM  03/06/2019 ECHOCARDIOGRAM     03/06/2019 PORTABLE CHEST 1 VIEW (Accession [id number])    02/27/2019 CT CHEST, ABDOMEN, AND PELVIS WITH CONTRAST (Accession [id number])    02/17/2019 CHEST XRAY - 2 VIEW  (Accession [id number])    RADIOGRAPHIC STUDIES: I have personally reviewed the radiological images as listed and agreed with the findings in the report. No results found.   ASSESSMENT & PLAN:   RANYA FIDDLER is a 86 y.o. female with:  1. 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 SKIN, LEFT THIGH, BIOPSY: - Involvement by angioimmunoblastic T-cell lymphoma." -04/07/2019 Echocardiogram - normal EF -04/08/2019 NM PET Image Initial (PI) Skull Base To Thigh (Accession [id number]) revealed "1. Widespread hypermetabolic adenopathy in the neck, chest, and abdomen/pelvis, primarily in the Deauville 4 and Deauville 5 range. There is also hypermetabolic activity along the posterior nasopharynx in lingual tonsillar regions potentially indicating sites of involvement. 2. The subtle hypodensities in the spine and bony pelvis are not appreciably hypermetabolic may be incidental, surveillance is suggested. 3. Bilateral thyroid activity but especially on the left. Probably from thyroiditis. 4. A 6 mm subpleural nodule anteriorly in the right lower lobe not appreciably hypermetabolic but is below sensitive PET-CT size thresholds and merits surveillance. 5. Mildly accentuated diffuse splenic activity without splenomegaly or focal splenic lesion identified. 6. Other imaging findings of potential clinical significance: Aortic Atherosclerosis (ICD10-I70.0). Coronary atherosclerosis. Large right and small left pleural effusions. Moderate cardiomegaly. Small pericardial effusion. Mild mesenteric edema. Large left renal cyst. Fullness of both renal collecting systems with suspected nonobstructive left nephrolithiasis. Degenerative grade 1 anterolisthesis at L4-5 at L5-S1. Trace pelvic ascites." -05/28/2019 PET/CT ([id number])  revealed "Complete metabolic response  to therapy". -09/16/2019 PET/CT ([id number]) revealed "1. Unfortunately evidence of lymphoma recurrence at sites of previous hypermetabolic adenopathy. Hypermetabolic lymphoid tissue is new from comparison PET-CT 05/28/2019 but at same locations as PET-CT 04/08/2019. The number of metastatic lymph nodes is less than 04/08/2019. Lymphoid tissue at the RIGHT skull base is larger. 2. Three foci of hypermetabolic recurrence are present, lymphoid tissue at the RIGHT base of skull, hypermetabolic RIGHT hilar lymph node and hypermetabolic lymph node in the porta hepatis." -11/25/2019 PET/CT ([id number]) revealed "1. Marked interval improvement in the hypermetabolic disease identified previously in the posterior right nasopharynx, compatible with Deauville 4 today. 2. Decreased hypermetabolism in the right hilar and porta hepatis lymph nodes identified previously (Deauville 3). No new sites of hypermetabolic disease in the chest, abdomen, or pelvis. 3. Similar diffuse thyroid uptake, left greater than right. 4. No substantial change in right-sided pulmonary nodules." -04/09/2020 PET/CT ([id number]) revealed "No evidence progressive lymphoma."  2. Pulmonary Embolism -extensive .likely related to extensive malignancy  3. H/o Breast Cancer  -The patient had bilateral diagnostic  mammography at [facility] 07/11/2011. This  showed some calcifications in the right  breast, which seemed a little bit more  prominent than prior. In the left breast  there was an area of possible  architectural distortion. However left  breast ultrasound the same day showed  no abnormality. 6 month followup was  suggested, and on 02/28/2012 the  patient again had bilateral diagnostic  mammography, now with right  ultrasonography. The microcalcifications  in the right breast appeared increased.  Ultrasound showed a hypoechoic lesion  measuring 5 mm, with no associated  shadowing. This had been previously  noted and appeared unchanged. Anterior  to  that there was a small hypoechoic  mass measuring 4 mm in diameter.  There was felt to be suspicious, and on  03/14/2012 the patient underwent biopsy  of the right breast mass, showing ([id number]  [id number]) ductal carcinoma in situ,  intermediate grade, estrogen receptor  100% and progesterone receptor 100%  positive.   -Bilateral breast MRI was obtained  03/26/2012. This showed only post  biopsy changes in the right breast,  associated with an area of non-masslike  enhancement measuring 2.4 cm. The  left breast was unremarkable, and there  was no enlarged axillary or internal  mammary adenopathy noted.  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 The patient's  subsequent history is as detailed below.   PLAN: -Discussed pt labwork today, 05/11/20; blood counts and chemistries are nml, LDH is trending in the right direction.  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  -The pt has no prohibitive toxicities from continuing C17 Brentuximab at this time. -Plan to repeat scans every 4-6 months, unless new symptoms  -Discussed proper safety precautions when gathering socially.  -Continue 2.5 Eliquis BID -Will see back in 3 weeks with labs    FOLLOW-UP: F/u for next chemotherapy as per scheduled appointments on 06/01/2020   The total time spent in the appt was 30 minutes and more than 50% was on counseling and direct patient cares.  All of the patient's questions were answered with apparent satisfaction. The patient knows to call the clinic with any problems, questions or concerns.    Sullivan Lone MD [place] AAHIVMS [facility] Hematology/Oncology Physician  [place]  (Office):       [phone] (Work cell):  [phone] (Fax):           [phone]  05/11/2020 8:15 AM   I,  Yevette Edwards, am acting as a scribe for Dr. Sullivan Lone.   .I have reviewed the above documentation for accuracy and completeness, and I agree with the above. Brunetta Genera MD

## 2020-05-11 NOTE — Progress Notes (Signed)
Patient c/o itching in BLE as well as back.  RN contacted Sandi Mealy, PA and verbal order received to give additional Pepcid 20mg  IV.

## 2020-05-11 NOTE — Patient Instructions (Signed)
[place] Discharge Instructions for Patients Receiving Chemotherapy  Today you received the following chemotherapy agents: brentuximab  To help prevent nausea and vomiting after your treatment, we encourage you to take your nausea medication as directed.   If you develop nausea and vomiting that is not controlled by your nausea medication, call the clinic.   BELOW ARE SYMPTOMS THAT SHOULD BE REPORTED IMMEDIATELY:  *FEVER GREATER THAN 100.5 F  *CHILLS WITH OR WITHOUT FEVER  NAUSEA AND VOMITING THAT IS NOT CONTROLLED WITH YOUR NAUSEA MEDICATION  *UNUSUAL SHORTNESS OF BREATH  *UNUSUAL BRUISING OR BLEEDING  TENDERNESS IN MOUTH AND THROAT WITH OR WITHOUT PRESENCE OF ULCERS  *URINARY PROBLEMS  *BOWEL PROBLEMS  UNUSUAL RASH Items with * indicate a potential emergency and should be followed up as soon as possible.  Feel free to call the clinic should you have any questions or concerns. The clinic phone number is [phone].  Please show the [place] at check-in to the Emergency Department and triage nurse.

## 2020-05-25 ENCOUNTER — Ambulatory Visit: Payer: Medicare Other

## 2020-05-25 ENCOUNTER — Other Ambulatory Visit: Payer: Medicare Other

## 2020-05-25 ENCOUNTER — Ambulatory Visit: Payer: Medicare Other | Admitting: Hematology

## 2020-06-01 ENCOUNTER — Inpatient Hospital Stay: Payer: Medicare Other

## 2020-06-01 ENCOUNTER — Inpatient Hospital Stay: Payer: Medicare Other | Attending: Hematology

## 2020-06-01 ENCOUNTER — Other Ambulatory Visit: Payer: Self-pay

## 2020-06-01 ENCOUNTER — Inpatient Hospital Stay (HOSPITAL_BASED_OUTPATIENT_CLINIC_OR_DEPARTMENT_OTHER): Payer: Medicare Other | Admitting: Hematology

## 2020-06-01 VITALS — BP 129/77 | HR 79 | Temp 97.0°F | Resp 18 | Ht 66.0 in | Wt 114.6 lb

## 2020-06-01 DIAGNOSIS — C8440 Peripheral T-cell lymphoma, not classified, unspecified site: Secondary | ICD-10-CM

## 2020-06-01 DIAGNOSIS — Z5111 Encounter for antineoplastic chemotherapy: Secondary | ICD-10-CM

## 2020-06-01 DIAGNOSIS — C865 Angioimmunoblastic T-cell lymphoma: Secondary | ICD-10-CM | POA: Diagnosis not present

## 2020-06-01 DIAGNOSIS — Z95828 Presence of other vascular implants and grafts: Secondary | ICD-10-CM | POA: Diagnosis not present

## 2020-06-01 DIAGNOSIS — Z853 Personal history of malignant neoplasm of breast: Secondary | ICD-10-CM | POA: Insufficient documentation

## 2020-06-01 DIAGNOSIS — I2699 Other pulmonary embolism without acute cor pulmonale: Secondary | ICD-10-CM | POA: Diagnosis not present

## 2020-06-01 DIAGNOSIS — Z5112 Encounter for antineoplastic immunotherapy: Secondary | ICD-10-CM | POA: Insufficient documentation

## 2020-06-01 DIAGNOSIS — Z7189 Other specified counseling: Secondary | ICD-10-CM

## 2020-06-01 LAB — CBC WITH DIFFERENTIAL/PLATELET
Abs Immature Granulocytes: 0.01 10*3/uL (ref 0.00–0.07)
Basophils Absolute: 0 10*3/uL (ref 0.0–0.1)
Basophils Relative: 1 %
Eosinophils Absolute: 0.1 10*3/uL (ref 0.0–0.5)
Eosinophils Relative: 2 %
HCT: 40.4 % (ref 36.0–46.0)
Hemoglobin: 13.3 g/dL (ref 12.0–15.0)
Immature Granulocytes: 0 %
Lymphocytes Relative: 16 %
Lymphs Abs: 0.7 10*3/uL (ref 0.7–4.0)
MCH: 29.6 pg (ref 26.0–34.0)
MCHC: 32.9 g/dL (ref 30.0–36.0)
MCV: 90 fL (ref 80.0–100.0)
Monocytes Absolute: 0.5 10*3/uL (ref 0.1–1.0)
Monocytes Relative: 11 %
Neutro Abs: 2.9 10*3/uL (ref 1.7–7.7)
Neutrophils Relative %: 70 %
Platelets: 186 10*3/uL (ref 150–400)
RBC: 4.49 MIL/uL (ref 3.87–5.11)
RDW: 14.7 % (ref 11.5–15.5)
WBC: 4.1 10*3/uL (ref 4.0–10.5)
nRBC: 0 % (ref 0.0–0.2)

## 2020-06-01 LAB — CMP (CANCER CENTER ONLY)
ALT: 12 U/L (ref 0–44)
AST: 16 U/L (ref 15–41)
Albumin: 3.9 g/dL (ref 3.5–5.0)
Alkaline Phosphatase: 81 U/L (ref 38–126)
Anion gap: 10 (ref 5–15)
BUN: 26 mg/dL — ABNORMAL HIGH (ref 8–23)
CO2: 28 mmol/L (ref 22–32)
Calcium: 9.6 mg/dL (ref 8.9–10.3)
Chloride: 105 mmol/L (ref 98–111)
Creatinine: 0.74 mg/dL (ref 0.44–1.00)
GFR, Estimated: >60 mL/min (ref >60)
Glucose, Bld: 121 mg/dL — ABNORMAL HIGH (ref 70–99)
Potassium: 4 mmol/L (ref 3.5–5.1)
Sodium: 143 mmol/L (ref 135–145)
Total Bilirubin: 0.5 mg/dL (ref 0.3–1.2)
Total Protein: 6.8 g/dL (ref 6.5–8.1)

## 2020-06-01 LAB — LACTATE DEHYDROGENASE: LDH: 219 U/L — ABNORMAL HIGH (ref 98–192)

## 2020-06-01 MED ORDER — FAMOTIDINE IN NACL 20-0.9 MG/50ML-% IV SOLN
20.0000 mg | Freq: Once | INTRAVENOUS | Status: AC
  Administered 2020-06-01: 20 mg via INTRAVENOUS

## 2020-06-01 MED ORDER — ONDANSETRON HCL 4 MG/2ML IJ SOLN
INTRAMUSCULAR | Status: AC
  Filled 2020-06-01: qty 2

## 2020-06-01 MED ORDER — ACETAMINOPHEN 325 MG PO TABS
650.0000 mg | ORAL_TABLET | Freq: Once | ORAL | Status: AC
  Administered 2020-06-01: 650 mg via ORAL

## 2020-06-01 MED ORDER — SODIUM CHLORIDE 0.9% FLUSH
10.0000 mL | INTRAVENOUS | Status: DC | PRN
  Administered 2020-06-01: 10 mL
  Filled 2020-06-01: qty 10

## 2020-06-01 MED ORDER — ACETAMINOPHEN 325 MG PO TABS
ORAL_TABLET | ORAL | Status: AC
  Filled 2020-06-01: qty 2

## 2020-06-01 MED ORDER — DIPHENHYDRAMINE HCL 50 MG/ML IJ SOLN
INTRAMUSCULAR | Status: AC
  Filled 2020-06-01: qty 1

## 2020-06-01 MED ORDER — HEPARIN SOD (PORK) LOCK FLUSH 100 UNIT/ML IV SOLN
500.0000 [IU] | Freq: Once | INTRAVENOUS | Status: AC | PRN
  Administered 2020-06-01: 500 [IU]
  Filled 2020-06-01: qty 5

## 2020-06-01 MED ORDER — ONDANSETRON HCL 4 MG/2ML IJ SOLN
4.0000 mg | Freq: Once | INTRAMUSCULAR | Status: AC
  Administered 2020-06-01: 4 mg via INTRAVENOUS

## 2020-06-01 MED ORDER — DEXAMETHASONE SODIUM PHOSPHATE 10 MG/ML IJ SOLN
INTRAMUSCULAR | Status: AC
  Filled 2020-06-01: qty 1

## 2020-06-01 MED ORDER — SODIUM CHLORIDE 0.9 % IV SOLN
1.8000 mg/kg | Freq: Once | INTRAVENOUS | Status: AC
  Administered 2020-06-01: 100 mg via INTRAVENOUS
  Filled 2020-06-01: qty 20

## 2020-06-01 MED ORDER — FAMOTIDINE IN NACL 20-0.9 MG/50ML-% IV SOLN
INTRAVENOUS | Status: AC
  Filled 2020-06-01: qty 50

## 2020-06-01 MED ORDER — DIPHENHYDRAMINE HCL 50 MG/ML IJ SOLN
50.0000 mg | Freq: Once | INTRAMUSCULAR | Status: AC
  Administered 2020-06-01: 50 mg via INTRAVENOUS

## 2020-06-01 MED ORDER — DEXAMETHASONE SODIUM PHOSPHATE 10 MG/ML IJ SOLN
5.0000 mg | Freq: Once | INTRAMUSCULAR | Status: AC
  Administered 2020-06-01: 5 mg via INTRAVENOUS

## 2020-06-01 MED ORDER — SODIUM CHLORIDE 0.9 % IV SOLN
Freq: Once | INTRAVENOUS | Status: AC
  Filled 2020-06-01: qty 250

## 2020-06-01 MED ORDER — SODIUM CHLORIDE 0.9% FLUSH
10.0000 mL | Freq: Once | INTRAVENOUS | Status: AC
Start: 2020-06-01 — End: 2020-06-01
  Administered 2020-06-01: 10 mL
  Filled 2020-06-01: qty 10

## 2020-06-01 NOTE — Progress Notes (Signed)
HEMATOLOGY/ONCOLOGY CLINIC NOTE  Date of Service: 06/01/2020  Patient Care Team: Lajean Manes, MD as PCP - General (Internal Medicine) Buford Dresser, MD as PCP - Cardiology (Cardiology)  REFERRING PHYSICIAN: Lajean Manes, MD  CHIEF COMPLAINTS/PURPOSE OF CONSULTATION:  Continue mx of Recently Diagnosed Angioimmunoblastic T cell lymphoma      HISTORY OF PRESENTING ILLNESS:   Sarah [name] is a wonderful 87 y.o. female who has been referred to [place] by Lajean Manes, MD for evaluation and management of Possible lymphoma . She is accompanied today by [name] . The pt reports that she is doing well overall.   Pending Surgical pathology from 03/17/2019 Dr.Manny will contact clinic about pathology   August 25th/27th she began coughing and sneezing. On Sept 3 went to doctors and they said it was allergies. She began having rashes (little red dots). Went to PCP and they prescribed prednisone and for several days and the medication helped. But as soon as she completed the prescription symptoms reoccurred.   She had a chest X Ray done because she was having a severe cough on 02/17/2019 revealing "bilateral effusions in this patient with history of breast cancer,of uncertain significance with question of right lower lobe pulmonary nodule, consider chest CT for further assessment. Atherosclerotic changes in the thoracic aorta."  Dr. Amy Martinique biopsied the rashes on her back 02/21/2019 and it was determined that it could be because of an allergy to medication. Dr.Stoneking stopped her blood pressure medication from September- October but ther rash still present. The itching started 1 to 2 months ago. The rash would come and go and was mostly on her back. She still has rash on her left thigh.  She is using triaconazole and cetrizine.  She has lumps behind ear and abdomin and she states that they have swollen twice the size within 1 and half months  She has no appetite and  has changes in her taste that began around August. She has changes in bowels. They are more loose and smaller In size. She also has no control over bowels.  She feels fatigued and it has increased since August.   She has a cough that starts in September  that has made her hoarse.   She had a CT scan on 02/27/2019 because she was having severe cough and her stomach was very extended. Revealing "1. Extensive lymphadenopathy throughout the chest, abdomen, and pelvis, with index lymph nodes identified above. Splenomegaly. Findings are most consistent with lymphoma with recurrent, metastatic breast malignancy less favored. 2. There is generally osteopenic appearance of the skeleton. There are numerous subtle hypodense lesions, particularly of the pelvis (e.g. Series 2, image 88) and select vertebral bodies, for example L4 (series 8, image 92). This is suspicious although not definitive for osseous metastatic disease. Characterization for metabolic activity by nuclear scintigraphic bone scan or PET-CT would be helpful to further evaluate. 3. Moderate right, small left pleural effusions with associated atelectasis or consolidation. Subpleural radiation fibrosis of the anterior right lung. 4. There is a new subpleural pulmonary nodule of the anterior right middle lobe measuring 6 mm (series 4, image 105), nonspecific. There is no obvious pleural thickening or nodularity definitive for metastatic disease. 5.  Small volume ascites. 6. There is a fluid attenuation lesion of the pancreatic uncinate measuring 2.4 x 1.3 cm (series 2, image 63), not substantially changed when compared to remote prior MR examination dated 02/23/2012 and likely sequelae of a prior pseudocyst versus incidental pancreatic IPMN. 7.  Cardiomegaly."  Hospitalized from 03/06/2019-03/08/2019. She presenting with acute shortness of breath, leg swelling,left hand swelling and some weight gain despite poor appetite. In ED, CTA chest revealed at least  submassive PE, moderate right pleural effusion, diffuse LAD concerning for lymphoma. Started on heparin drip. PCCM consulted for guidance. She had thoracocentesis with removal of 1200 cc cloudy exudative pleural fluid. Cardiology consulted for elevated which was thought to be demand ischemia from right heart strain in the setting of PE. Patient was transitioned to subcu Lovenox by pulmonology the next day and remained stable. Unusual appearance of tracheal air column at the thoracic inlet. Correlate with any history of swallowing difficulty or changes in voice with dedicated imaging with chest CT and or CT of the neck as indicated.  She has a nonhealing ulcer on her right tibialis anterior that starting weeping one week ago. Of note since the patient's last visit, pt has had CHEST XRAY - 2 VIEW (Accession [id number]) completed on 02/17/2019 with results revealing "Bilateral effusions in this patient with history of breast cancer,of uncertain significance with question of right lower lobepulmonary nodule, consider chest CT for furtherassessment.Unusual appearance of tracheal air column at the thoracic inlet.Correlate with any history of swallowing difficulty or changes in voice with dedicated imaging with chest CT and or CT of the neck as Indicated. Atherosclerotic changes in the thoracic aorta."  Of note since the patient's last visit, pt has had CT CHEST, ABDOMEN, AND PELVIS WITH CONTRAST (Accession [id number]) completed on 02/27/2019 with results revealing "1. Extensive lymphadenopathy throughout the chest, abdomen, and pelvis, with index lymph nodes identified above. Splenomegaly.Findings are most consistent with lymphoma with recurrent, metastatic breast malignancy less favored. 2. There is generally osteopenic appearance of the skeleton. There are numerous subtle hypodense lesions, particularly of the pelvis (e.g. Series 2, image 88) and select vertebral bodies, for example L4 (series 8, image 92).  This is suspicious although not definitive for osseous metastatic disease. Characterization for metabolic activity by nuclear scintigraphic bone scan or PET-CT would be helpful to further evaluate.  3. Moderate right, small left pleural effusions with associated atelectasis or consolidation. Subpleural radiation fibrosis of the anterior right lung.4. There is a new subpleural pulmonary nodule of the anterior right middle lobe measuring 6 mm (series 4, image 105), nonspecific. There is no obvious pleural thickening or nodularity definitive for metastatic disease.5.  Small volume ascites. 6. There is a fluid attenuation lesion of the pancreatic uncinate measuring 2.4 x 1.3 cm (series 2, image 63), not substantially changed when compared to remote prior MR examination dated 02/23/2012 and likely sequelae of a prior pseudocyst versus incidental pancreatic IPMN.7.  Cardiomegaly.8. Other chronic and incidental findings as detailed above. Aortic Atherosclerosis (ICD10-I70.0)."  Of note since the patient's last visit, pt has had PORTABLE CHEST 1 VIEW (Accession [id number]) completed on 03/06/2019 with results revealing "1. Moderate size right and small left pleural effusions appear not significantly changed since 02/27/19. 2. No new cardiopulmonary abnormality."  Of note since the patient's last visit, pt has had ECHOCARDIOGRAM  completed on 03/06/2019 with results revealing  "1. Left ventricular ejection fraction, by visual estimation, is 60 to 65%. The left ventricle has normal function. Normal left ventricular size. There is no left ventricular hypertrophy. 2. Left ventricular diastolic Doppler parameters are consistent with impaired relaxation pattern of LV diastolic filling. 3. Global right ventricle has mildly reduced systolic function.The right ventricular size is moderately enlarged. No increase in right ventricular wall thickness. 4. Left atrial size was normal.  5. Right atrial size was mildly dilated.  6. Trivial pericardial effusion is present. 7. The mitral valve is normal in structure. Trace mitral valve regurgitation. No evidence of mitral stenosis. 8. The tricuspid valve is normal in structure. Tricuspid valve regurgitation severe. 9. The aortic valve is normal in structure. Aortic valve regurgitation was not visualized by color flow Doppler. Structurally normal aortic valve, with no evidence of sclerosis or stenosis.10. The pulmonic valve was normal in structure. Pulmonic valve regurgitation is mild by color flow Doppler.11. Mildly elevated pulmonary artery systolic pressure.12. The inferior vena cava is normal in size with greater than 50% respiratory variability, suggesting right atrial pressure of 3 mmHg."  Of note since the patient's last visit, pt has had CT ANGIOGRAPHY CHEST WITH CONTRAST (Accession [id number]) completed on 03/06/2019 with results revealing "1. Pulmonary embolus arising from the distal left main pulmonary artery with extension into multiple left lower lobe pulmonary arterial branches. There is also incompletely obstructing pulmonary embolus in the proximal left upper lobe pulmonary artery. More distal pulmonary embolus in the right lower lobe pulmonary artery. Positive for acute PE with CT evidence of right heart strain (RV/LV Ratio = 1.6) consistent with at least submassive (intermediate risk) PE. The presence of right heart strain has been associated with an increased risk of morbidity and mortality. Please activate Code PE by paging [phone]. 2. Sizable pleural effusion on the right with smaller pleural effusion on the left. Consolidation and compressive atelectasis throughout most of the right lower lobe. Milder atelectasis left base. Nodular opacities on the right, likely metastatic foci.3.  Stable adenopathy compared to 1 week prior.4. Enlargement of spleen, incompletely visualized but documented CT 1 week prior. Concern for potential lymphoma. 5. Aortic atherosclerosis.  No aneurysm or dissection evident. Foci of coronary artery calcification noted."  Most recent lab results (03/06/2019) of CBC is as follows: all values are WNL except for Platelets at 72, Eosinophils Absolute at 0.6,Abs Immature Granulocytes at 0.08 .  On review of systems, pt reports rashes, digestive issues and denies back pain, abdominal pain and any other symptoms.   On PMHx the pt reports Hypothyroidism, MCI, Hypercholesterolemia, Tracheal anomaly, Hypertension, Pulmonary nodule, Atherosclerosis of aorta, anxiety, gait disorder, primary insomnia.   INTERVAL HISTORY:  Sarah [name] is a 87 y.o. female here for evaluation and management of Angioimmunoblastic T-cell lymphoma. The patient's last visit with [place] was on 05/11/2020. The pt reports that she is doing well overall.  The pt reports no new acute new symptoms. No fevers/chills/night sweats/rashes/neuropathy etc.  No new toxicities from treatment.  MEDICAL HISTORY:  Past Medical History:  Diagnosis Date  . Allergy    codeine, thiazides  . Anxiety    new dx  . Arthritis   . Breast cancer ([place]) 03/14/12   bx=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,ER/PR=+,upper inner quad  . Cancer ([place])    breast  . Cataract   . DVT (deep venous thrombosis) ([place]) 02/2019   left leg  . Dyspnea   . Edema    both legs feet and toe, abdomen  . Glaucoma    laser treated years ago  . HOH (hard of hearing)   . Hyperlipemia   . Hypertension   . Hypothyroidism   . Pancreatic cyst    benign  . PONV (postoperative nausea and vomiting)   . Pulmonary embolism ([place]) 02/2019   bilateral   . Radiation 06/11/2012-07/12/2012   17 sessions 4250 cGy, 3 sessions 750 cGy  . Vertigo   .  Wears glasses   . Wears partial dentures    partial upper     SURGICAL HISTORY: Past Surgical History:  Procedure Laterality Date  . ABDOMINAL HYSTERECTOMY  1966   1/2 ovary left in   . BREAST SURGERY  1992   lumpectomy-lt  . CATARACT  EXTRACTION     b/l  . COLONOSCOPY    . EXCISION MASS NECK Left 03/17/2019    EXCISION MASS NECK (Left Neck)  . EXCISION MASS NECK Left 03/17/2019   Procedure: EXCISION MASS NECK;  Surgeon: Helayne Seminole, MD;  Location: [facility];  Service: ENT;  Laterality: Left;  . EYE SURGERY Bilateral    bilateral cataract removal  . IR IMAGING GUIDED PORT INSERTION  04/23/2019  . JOINT REPLACEMENT  2013   rt total knee  . JOINT REPLACEMENT  1995   lt total knee  . PARTIAL MASTECTOMY WITH NEEDLE LOCALIZATION  04/09/2012   Procedure: PARTIAL MASTECTOMY WITH NEEDLE LOCALIZATION;  Surgeon: Adin Hector, MD;  Location: [facility];  Service: General;  Laterality: Right;  . SKIN BIOPSY Left 03/17/2019   LEFT THIGH  . SKIN BIOPSY Left 03/17/2019   Procedure: Skin Biopsy Left Thigh;  Surgeon: Helayne Seminole, MD;  Location: [place];  Service: ENT;  Laterality: Left;  . TONSILLECTOMY    . TOTAL KNEE ARTHROPLASTY  08/16/2011   Procedure: TOTAL KNEE ARTHROPLASTY;  Surgeon: Ninetta Lights, MD;  Location: [facility];  Service: Orthopedics;  Laterality: Right;     SOCIAL HISTORY: Social History   Socioeconomic History  . Marital status: Widowed    Spouse name: Not on file  . Number of children: 2  . Years of education: Not on file  . Highest education level: Not on file  Occupational History  . Occupation: Retired    Comment: Community education officer   Tobacco Use  . Smoking status: Never Smoker  . Smokeless tobacco: Never Used  Vaping Use  . Vaping Use: Never used  Substance and Sexual Activity  . Alcohol use: No  . Drug use: No  . Sexual activity: Not Currently  Other Topics Concern  . Not on file  Social History Narrative  . Not on file   Social Determinants of Health   Financial Resource Strain: Not on file  Food Insecurity: Not on file  Transportation Needs: Not on file  Physical Activity: Not on file  Stress: Not on file  Social Connections: Not on file  Intimate  Partner Violence: Not on file     FAMILY HISTORY: Family History  Problem Relation Age of Onset  . Heart disease Father   . Cancer Maternal Aunt        stomach     ALLERGIES:   is allergic to citalopram, alendronate sodium, amoxicillin-pot clavulanate, codeine, latex, lisinopril, septra [sulfamethoxazole-trimethoprim], and thiazide-type diuretics.   MEDICATIONS:  Current Outpatient Medications  Medication Sig Dispense Refill  . amLODipine (NORVASC) 2.5 MG tablet Take 2.5-5 mg by mouth See admin instructions. 5 mg in the morning, 2.5 mg in the evening    . amoxicillin (AMOXIL) 500 MG capsule Take 4 capsules (2,000 mg total) by mouth once as needed for up to 1 dose. 60 mins prior to dental procedure 4 capsule 0  . apixaban (ELIQUIS) 2.5 MG TABS tablet Take 1 tablet (2.5 mg total) by mouth 2 (two) times daily. 60 tablet 5  . benzonatate (TESSALON) 100 MG capsule Take 1 capsule (100 mg total) by mouth 3 (three) times  daily as needed for cough. 30 capsule 0  . Cholecalciferol (VITAMIN D) 50 MCG (2000 UT) tablet Take 2,000 Units by mouth 2 (two) times daily.    . clonazepam (KLONOPIN) 0.125 MG disintegrating tablet Take 1 tablet (0.125 mg total) by mouth 2 (two) times daily. 60 tablet 0  . Cyanocobalamin (VITAMIN B-12 PO) Take 3,000 mcg by mouth daily.     Marland Kitchen levothyroxine (SYNTHROID) 25 MCG tablet Take 25 mcg by mouth daily before breakfast.     . lidocaine-prilocaine (EMLA) cream Apply to affected area once 30 g 3  . LORazepam (ATIVAN) 0.5 MG tablet TAKE 1/2 TO 1 (ONE-HALF TO ONE) TABLET BY MOUTH EVERY 8 HOURS 30 tablet 0  . Multiple Vitamins-Minerals (PRESERVISION AREDS PO) Take 1 capsule by mouth 2 (two) times daily.     . ondansetron (ZOFRAN) 8 MG tablet Take 1 tablet (8 mg total) by mouth 2 (two) times daily as needed. Start on the third day after chemotherapy. 30 tablet 1  . polyethylene glycol (MIRALAX / GLYCOLAX) 17 g packet Take 17 g by mouth daily.    . prochlorperazine  (COMPAZINE) 10 MG tablet Take 1 tablet (10 mg total) by mouth every 6 (six) hours as needed (Nausea or vomiting). 30 tablet 1  . sodium chloride (MURO 128) 5 % ophthalmic ointment Place 1 application into both eyes at bedtime.    Marland Kitchen XIIDRA 5 % SOLN Place 1 drop into both eyes daily as needed (dry eyes).     Marland Kitchen zolpidem (AMBIEN) 5 MG tablet Take 5 mg by mouth at bedtime as needed for sleep.      No current facility-administered medications for this visit.     REVIEW OF SYSTEMS:   A 10+ POINT REVIEW OF SYSTEMS WAS OBTAINED including neurology, dermatology, psychiatry, cardiac, respiratory, lymph, extremities, GI, GU, Musculoskeletal, constitutional, breasts, reproductive, HEENT.  All pertinent positives are noted in the HPI.  All others are negative.   PHYSICAL EXAMINATION: ECOG FS:1 - Symptomatic but completely ambulatory  There were no vitals filed for this visit. Wt Readings from Last 3 Encounters:  05/11/20 113 lb 14.4 oz (51.7 kg)  04/13/20 112 lb 3.2 oz (50.9 kg)  03/23/20 111 lb 14.4 oz (50.8 kg)   There is no height or weight on file to calculate BMI.    NAD GENERAL:alert, in no acute distress and comfortable SKIN: no acute rashes, no significant lesions EYES: conjunctiva are pink and non-injected, sclera anicteric OROPHARYNX: MMM, no exudates, no oropharyngeal erythema or ulceration NECK: supple, no JVD LYMPH:  no palpable lymphadenopathy in the cervical, axillary or inguinal regions LUNGS: clear to auscultation b/l with normal respiratory effort HEART: regular rate & rhythm ABDOMEN:  normoactive bowel sounds , non tender, not distended. No palpable hepatosplenomegaly.  Extremity: no pedal edema PSYCH: alert & oriented x 3 with fluent speech NEURO: no focal motor/sensory deficits  LABORATORY DATA:  I have reviewed the data as listed  CBC Latest Ref Rng & Units 06/01/2020 05/11/2020 04/13/2020  WBC 4.0 - 10.5 K/uL 4.1 5.2 3.7(L)  Hemoglobin 12.0 - 15.0 g/dL 13.3 13.7 13.4   Hematocrit 36.0 - 46.0 % 40.4 41.0 39.7  Platelets 150 - 400 K/uL 186 214 181    CMP Latest Ref Rng & Units 06/01/2020 05/11/2020 04/13/2020  Glucose 70 - 99 mg/dL 121(H) 124(H) 88  BUN 8 - 23 mg/dL 26(H) 31(H) 21  Creatinine 0.44 - 1.00 mg/dL 0.74 0.82 0.70  Sodium 135 - 145 mmol/L 143 143 143  Potassium 3.5 - 5.1 mmol/L 4.0 4.0 3.9  Chloride 98 - 111 mmol/L 105 105 107  CO2 22 - 32 mmol/L 28 29 25   Calcium 8.9 - 10.3 mg/dL 9.6 9.9 9.6  Total Protein 6.5 - 8.1 g/dL 6.8 7.1 6.8  Total Bilirubin 0.3 - 1.2 mg/dL 0.5 0.6 0.5  Alkaline Phos 38 - 126 U/L 81 101 72  AST 15 - 41 U/L 16 18 17   ALT 0 - 44 U/L 12 15 12    . Lab Results  Component Value Date   LDH 219 (H) 06/01/2020    Component     Latest Ref Rng & Units 03/27/2019  Hepatitis B Surface Ag     NON REACTIVE NON REACTIVE  Hep B Core Total Ab     NON REACTIVE NON REACTIVE  HCV Ab     NON REACTIVE NON REACTIVE  LDH     98 - 192 U/L 252 (H)   . Lab Results  Component Value Date   LDH 276 (H) 05/11/2020      04/08/2019 NM PET Image Initial (PI) Skull Base To Thigh (Accession [id number])  04/04/2019 PATHOLOGY   04/07/2019 ECHOCARDIOGRAM  03/28/2019 PATHOLOGY   03/06/2019 CT ANGIOGRAPHY CHEST WITH CONTRAST (Accession [id number])  04/07/2019 ECHOCARDIOGRAM  03/06/2019 ECHOCARDIOGRAM     03/06/2019 PORTABLE CHEST 1 VIEW (Accession [id number])    02/27/2019 CT CHEST, ABDOMEN, AND PELVIS WITH CONTRAST (Accession [id number])    02/17/2019 CHEST XRAY - 2 VIEW (Accession [id number])    RADIOGRAPHIC STUDIES: I have personally reviewed the radiological images as listed and agreed with the findings in the report. No results found.   ASSESSMENT & PLAN:   Sarah [name] is a 87 y.o. female with:  1. 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  SKIN, LEFT THIGH, BIOPSY: - Involvement by angioimmunoblastic T-cell lymphoma." -04/07/2019 Echocardiogram - normal EF -04/08/2019 NM PET Image Initial (PI) Skull Base To Thigh (Accession [id number]) revealed "1. Widespread hypermetabolic adenopathy in the neck, chest, and abdomen/pelvis, primarily in the Deauville 4 and Deauville 5 range. There is also hypermetabolic activity along the posterior nasopharynx in lingual tonsillar regions potentially indicating sites of involvement. 2. The subtle hypodensities in the spine and bony pelvis are not appreciably hypermetabolic may be incidental, surveillance is suggested. 3. Bilateral thyroid activity but especially on the left. Probably from thyroiditis. 4. A 6 mm subpleural nodule anteriorly in the right lower lobe not appreciably hypermetabolic but is below sensitive PET-CT size thresholds and merits surveillance. 5. Mildly accentuated diffuse splenic activity without splenomegaly or focal splenic lesion identified. 6. Other imaging findings of potential clinical significance: Aortic Atherosclerosis (ICD10-I70.0). Coronary atherosclerosis. Large right and small left pleural effusions. Moderate cardiomegaly. Small pericardial effusion. Mild mesenteric edema. Large left renal cyst. Fullness of both renal collecting systems with suspected nonobstructive left nephrolithiasis. Degenerative grade 1 anterolisthesis at L4-5 at L5-S1. Trace pelvic ascites." -05/28/2019 PET/CT ([id number]) revealed "Complete metabolic response to therapy". -09/16/2019 PET/CT ([id number]) revealed "1. Unfortunately evidence of lymphoma recurrence at sites of previous hypermetabolic adenopathy. Hypermetabolic lymphoid tissue is new from comparison PET-CT 05/28/2019 but at same locations as PET-CT 04/08/2019. The number of metastatic lymph nodes is less than 04/08/2019. Lymphoid tissue at the RIGHT skull base is larger. 2. Three foci of hypermetabolic recurrence are present, lymphoid tissue at  the RIGHT base of skull, hypermetabolic RIGHT hilar lymph node and hypermetabolic lymph node in the porta  hepatis." -11/25/2019 PET/CT ([id number]) revealed "1. Marked interval improvement in the hypermetabolic disease identified previously in the posterior right nasopharynx, compatible with Deauville 4 today. 2. Decreased hypermetabolism in the right hilar and porta hepatis lymph nodes identified previously (Deauville 3). No new sites of hypermetabolic disease in the chest, abdomen, or pelvis. 3. Similar diffuse thyroid uptake, left greater than right. 4. No substantial change in right-sided pulmonary nodules." -04/09/2020 PET/CT ([id number]) revealed "No evidence progressive lymphoma."  2. Pulmonary Embolism -extensive .likely related to extensive malignancy  3. H/o Breast Cancer  -The patient had bilateral diagnostic  mammography at [facility] 07/11/2011. This  showed some calcifications in the right  breast, which seemed a little bit more  prominent than prior. In the left breast  there was an area of possible  architectural distortion. However left  breast ultrasound the same day showed  no abnormality. 6 month followup was  suggested, and on 02/28/2012 the  patient again had bilateral diagnostic  mammography, now with right  ultrasonography. The microcalcifications  in the right breast appeared increased.  Ultrasound showed a hypoechoic lesion  measuring 5 mm, with no associated  shadowing. This had been previously  noted and appeared unchanged. Anterior  to that there was a small hypoechoic  mass measuring 4 mm in diameter.  There was felt to be suspicious, and on  03/14/2012 the patient underwent biopsy  of the right breast mass, showing (SAA  [id number]) ductal carcinoma in situ,  intermediate grade, estrogen receptor  100% and progesterone receptor 100%  positive.   -Bilateral breast MRI was obtained  03/26/2012. This showed only post  biopsy changes in the right breast,  associated with an area of  non-masslike  enhancement measuring 2.4 cm. The  left breast was unremarkable, and there  was no enlarged axillary or internal  mammary adenopathy noted.  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 The patient's  subsequent history is as detailed below.   PLAN: -No lab or clinical evidence of Angioimmunoblastic T-cell lymphoma progression at this time. -labs stable -The pt has no prohibitive toxicities from continuing C18 Brentuximab at this time. -Continue 2.5 Eliquis BID    FOLLOW-UP: Plz schedule for next 2 cycles of chemotherapy as per orders with portflush, labs and MD visits  The total time spent in the appt was 30 minutes and more than 50% was on counseling and direct patient cares, ordering and management of chemotherapy.  All of the patient's questions were answered with apparent satisfaction. The patient knows to call the clinic with any problems, questions or concerns.    Sullivan Lone MD [place] AAHIVMS [facility] Hematology/Oncology Physician [facility]  (Office):       [phone] (Work cell):  [phone] (Fax):           [phone]  06/01/2020 11:08 AM   I, Yevette Edwards, am acting as a scribe for Dr. Sullivan Lone.   .I have reviewed the above documentation for accuracy and completeness, and I agree with the above. Brunetta Genera MD

## 2020-06-01 NOTE — Patient Instructions (Signed)
[facility] Discharge Instructions for Patients Receiving Chemotherapy  Today you received the following chemotherapy agents: brentuximab vedotin.  To help prevent nausea and vomiting after your treatment, we encourage you to take your nausea medication as directed.   If you develop nausea and vomiting that is not controlled by your nausea medication, call the clinic.   BELOW ARE SYMPTOMS THAT SHOULD BE REPORTED IMMEDIATELY:  *FEVER GREATER THAN 100.5 F  *CHILLS WITH OR WITHOUT FEVER  NAUSEA AND VOMITING THAT IS NOT CONTROLLED WITH YOUR NAUSEA MEDICATION  *UNUSUAL SHORTNESS OF BREATH  *UNUSUAL BRUISING OR BLEEDING  TENDERNESS IN MOUTH AND THROAT WITH OR WITHOUT PRESENCE OF ULCERS  *URINARY PROBLEMS  *BOWEL PROBLEMS  UNUSUAL RASH Items with * indicate a potential emergency and should be followed up as soon as possible.  Feel free to call the clinic should you have any questions or concerns. The clinic phone number is [phone].  Please show the [place] at check-in to the Emergency Department and triage nurse.

## 2020-06-15 ENCOUNTER — Ambulatory Visit: Payer: Medicare Other

## 2020-06-15 ENCOUNTER — Ambulatory Visit: Payer: Medicare Other | Admitting: Hematology

## 2020-06-15 ENCOUNTER — Other Ambulatory Visit: Payer: Medicare Other

## 2020-06-16 DIAGNOSIS — L820 Inflamed seborrheic keratosis: Secondary | ICD-10-CM | POA: Diagnosis not present

## 2020-06-16 DIAGNOSIS — L821 Other seborrheic keratosis: Secondary | ICD-10-CM | POA: Diagnosis not present

## 2020-06-22 ENCOUNTER — Ambulatory Visit (INDEPENDENT_AMBULATORY_CARE_PROVIDER_SITE_OTHER): Payer: Medicare Other | Admitting: Podiatry

## 2020-06-22 ENCOUNTER — Encounter: Payer: Self-pay | Admitting: Podiatry

## 2020-06-22 ENCOUNTER — Other Ambulatory Visit: Payer: Self-pay

## 2020-06-22 DIAGNOSIS — B351 Tinea unguium: Secondary | ICD-10-CM

## 2020-06-22 DIAGNOSIS — L97909 Non-pressure chronic ulcer of unspecified part of unspecified lower leg with unspecified severity: Secondary | ICD-10-CM | POA: Insufficient documentation

## 2020-06-22 DIAGNOSIS — I700 Atherosclerosis of aorta: Secondary | ICD-10-CM | POA: Insufficient documentation

## 2020-06-22 DIAGNOSIS — M79675 Pain in left toe(s): Secondary | ICD-10-CM

## 2020-06-22 DIAGNOSIS — M2041 Other hammer toe(s) (acquired), right foot: Secondary | ICD-10-CM

## 2020-06-22 DIAGNOSIS — R269 Unspecified abnormalities of gait and mobility: Secondary | ICD-10-CM | POA: Insufficient documentation

## 2020-06-22 DIAGNOSIS — M79674 Pain in right toe(s): Secondary | ICD-10-CM

## 2020-06-22 DIAGNOSIS — M810 Age-related osteoporosis without current pathological fracture: Secondary | ICD-10-CM | POA: Insufficient documentation

## 2020-06-22 DIAGNOSIS — F09 Unspecified mental disorder due to known physiological condition: Secondary | ICD-10-CM | POA: Insufficient documentation

## 2020-06-22 DIAGNOSIS — G4700 Insomnia, unspecified: Secondary | ICD-10-CM | POA: Insufficient documentation

## 2020-06-22 DIAGNOSIS — R911 Solitary pulmonary nodule: Secondary | ICD-10-CM | POA: Insufficient documentation

## 2020-06-22 DIAGNOSIS — M2042 Other hammer toe(s) (acquired), left foot: Secondary | ICD-10-CM

## 2020-06-22 DIAGNOSIS — F5101 Primary insomnia: Secondary | ICD-10-CM | POA: Insufficient documentation

## 2020-06-22 DIAGNOSIS — Q321 Other congenital malformations of trachea: Secondary | ICD-10-CM | POA: Insufficient documentation

## 2020-06-22 DIAGNOSIS — C8590 Non-Hodgkin lymphoma, unspecified, unspecified site: Secondary | ICD-10-CM | POA: Insufficient documentation

## 2020-06-22 DIAGNOSIS — F419 Anxiety disorder, unspecified: Secondary | ICD-10-CM | POA: Insufficient documentation

## 2020-06-22 NOTE — Progress Notes (Signed)
HEMATOLOGY/ONCOLOGY CLINIC NOTE  Date of Service: 06/22/2020  Patient Care Team: Lajean Manes, MD as PCP - General (Internal Medicine) Buford Dresser, MD as PCP - Cardiology (Cardiology)  REFERRING PHYSICIAN: Lajean Manes, MD  CHIEF COMPLAINTS/PURPOSE OF CONSULTATION:  Continue mx of Recently Diagnosed Angioimmunoblastic T cell lymphoma      HISTORY OF PRESENTING ILLNESS:   Sarah [name] is a wonderful 87 y.o. female who has been referred to [place] by Lajean Manes, MD for evaluation and management of Possible lymphoma . She is accompanied today by [name] . The pt reports that she is doing well overall.   Pending Surgical pathology from 03/17/2019 Dr.Manny will contact clinic about pathology   August 25th/27th she began coughing and sneezing. On Sept 3 went to doctors and they said it was allergies. She began having rashes (little red dots). Went to PCP and they prescribed prednisone and for several days and the medication helped. But as soon as she completed the prescription symptoms reoccurred.   She had a chest X Ray done because she was having a severe cough on 02/17/2019 revealing "bilateral effusions in this patient with history of breast cancer,of uncertain significance with question of right lower lobe pulmonary nodule, consider chest CT for further assessment. Atherosclerotic changes in the thoracic aorta."  Dr. Amy Martinique biopsied the rashes on her back 02/21/2019 and it was determined that it could be because of an allergy to medication. Dr.Stoneking stopped her blood pressure medication from September- October but ther rash still present. The itching started 1 to 2 months ago. The rash would come and go and was mostly on her back. She still has rash on her left thigh.  She is using triaconazole and cetrizine.  She has lumps behind ear and abdomin and she states that they have swollen twice the size within 1 and half months  She has no appetite and  has changes in her taste that began around August. She has changes in bowels. They are more loose and smaller In size. She also has no control over bowels.  She feels fatigued and it has increased since August.   She has a cough that starts in September  that has made her hoarse.   She had a CT scan on 02/27/2019 because she was having severe cough and her stomach was very extended. Revealing "1. Extensive lymphadenopathy throughout the chest, abdomen, and pelvis, with index lymph nodes identified above. Splenomegaly. Findings are most consistent with lymphoma with recurrent, metastatic breast malignancy less favored. 2. There is generally osteopenic appearance of the skeleton. There are numerous subtle hypodense lesions, particularly of the pelvis (e.g. Series 2, image 88) and select vertebral bodies, for example L4 (series 8, image 92). This is suspicious although not definitive for osseous metastatic disease. Characterization for metabolic activity by nuclear scintigraphic bone scan or PET-CT would be helpful to further evaluate. 3. Moderate right, small left pleural effusions with associated atelectasis or consolidation. Subpleural radiation fibrosis of the anterior right lung. 4. There is a new subpleural pulmonary nodule of the anterior right middle lobe measuring 6 mm (series 4, image 105), nonspecific. There is no obvious pleural thickening or nodularity definitive for metastatic disease. 5.  Small volume ascites. 6. There is a fluid attenuation lesion of the pancreatic uncinate measuring 2.4 x 1.3 cm (series 2, image 63), not substantially changed when compared to remote prior MR examination dated 02/23/2012 and likely sequelae of a prior pseudocyst versus incidental pancreatic IPMN. 7.  Cardiomegaly."  Hospitalized from 03/06/2019-03/08/2019. She presenting with acute shortness of breath, leg swelling,left hand swelling and some weight gain despite poor appetite. In ED, CTA chest revealed at least  submassive PE, moderate right pleural effusion, diffuse LAD concerning for lymphoma. Started on heparin drip. PCCM consulted for guidance. She had thoracocentesis with removal of 1200 cc cloudy exudative pleural fluid. Cardiology consulted for elevated which was thought to be demand ischemia from right heart strain in the setting of PE. Patient was transitioned to subcu Lovenox by pulmonology the next day and remained stable. Unusual appearance of tracheal air column at the thoracic inlet. Correlate with any history of swallowing difficulty or changes in voice with dedicated imaging with chest CT and or CT of the neck as indicated.  She has a nonhealing ulcer on her right tibialis anterior that starting weeping one week ago. Of note since the patient's last visit, pt has had CHEST XRAY - 2 VIEW (Accession [id number]) completed on 02/17/2019 with results revealing "Bilateral effusions in this patient with history of breast cancer,of uncertain significance with question of right lower lobepulmonary nodule, consider chest CT for furtherassessment.Unusual appearance of tracheal air column at the thoracic inlet.Correlate with any history of swallowing difficulty or changes in voice with dedicated imaging with chest CT and or CT of the neck as Indicated. Atherosclerotic changes in the thoracic aorta."  Of note since the patient's last visit, pt has had CT CHEST, ABDOMEN, AND PELVIS WITH CONTRAST (Accession [id number]) completed on 02/27/2019 with results revealing "1. Extensive lymphadenopathy throughout the chest, abdomen, and pelvis, with index lymph nodes identified above. Splenomegaly.Findings are most consistent with lymphoma with recurrent, metastatic breast malignancy less favored. 2. There is generally osteopenic appearance of the skeleton. There are numerous subtle hypodense lesions, particularly of the pelvis (e.g. Series 2, image 88) and select vertebral bodies, for example L4 (series 8, image 92).  This is suspicious although not definitive for osseous metastatic disease. Characterization for metabolic activity by nuclear scintigraphic bone scan or PET-CT would be helpful to further evaluate.  3. Moderate right, small left pleural effusions with associated atelectasis or consolidation. Subpleural radiation fibrosis of the anterior right lung.4. There is a new subpleural pulmonary nodule of the anterior right middle lobe measuring 6 mm (series 4, image 105), nonspecific. There is no obvious pleural thickening or nodularity definitive for metastatic disease.5.  Small volume ascites. 6. There is a fluid attenuation lesion of the pancreatic uncinate measuring 2.4 x 1.3 cm (series 2, image 63), not substantially changed when compared to remote prior MR examination dated 02/23/2012 and likely sequelae of a prior pseudocyst versus incidental pancreatic IPMN.7.  Cardiomegaly.8. Other chronic and incidental findings as detailed above. Aortic Atherosclerosis (ICD10-I70.0)."  Of note since the patient's last visit, pt has had PORTABLE CHEST 1 VIEW (Accession [id number]) completed on 03/06/2019 with results revealing "1. Moderate size right and small left pleural effusions appear not significantly changed since 02/27/19. 2. No new cardiopulmonary abnormality."  Of note since the patient's last visit, pt has had ECHOCARDIOGRAM  completed on 03/06/2019 with results revealing  "1. Left ventricular ejection fraction, by visual estimation, is 60 to 65%. The left ventricle has normal function. Normal left ventricular size. There is no left ventricular hypertrophy. 2. Left ventricular diastolic Doppler parameters are consistent with impaired relaxation pattern of LV diastolic filling. 3. Global right ventricle has mildly reduced systolic function.The right ventricular size is moderately enlarged. No increase in right ventricular wall thickness. 4. Left atrial size was normal.  5. Right atrial size was mildly dilated.  6. Trivial pericardial effusion is present. 7. The mitral valve is normal in structure. Trace mitral valve regurgitation. No evidence of mitral stenosis. 8. The tricuspid valve is normal in structure. Tricuspid valve regurgitation severe. 9. The aortic valve is normal in structure. Aortic valve regurgitation was not visualized by color flow Doppler. Structurally normal aortic valve, with no evidence of sclerosis or stenosis.10. The pulmonic valve was normal in structure. Pulmonic valve regurgitation is mild by color flow Doppler.11. Mildly elevated pulmonary artery systolic pressure.12. The inferior vena cava is normal in size with greater than 50% respiratory variability, suggesting right atrial pressure of 3 mmHg."  Of note since the patient's last visit, pt has had CT ANGIOGRAPHY CHEST WITH CONTRAST (Accession [id number]) completed on 03/06/2019 with results revealing "1. Pulmonary embolus arising from the distal left main pulmonary artery with extension into multiple left lower lobe pulmonary arterial branches. There is also incompletely obstructing pulmonary embolus in the proximal left upper lobe pulmonary artery. More distal pulmonary embolus in the right lower lobe pulmonary artery. Positive for acute PE with CT evidence of right heart strain (RV/LV Ratio = 1.6) consistent with at least submassive (intermediate risk) PE. The presence of right heart strain has been associated with an increased risk of morbidity and mortality. Please activate Code PE by paging [phone]. 2. Sizable pleural effusion on the right with smaller pleural effusion on the left. Consolidation and compressive atelectasis throughout most of the right lower lobe. Milder atelectasis left base. Nodular opacities on the right, likely metastatic foci.3.  Stable adenopathy compared to 1 week prior.4. Enlargement of spleen, incompletely visualized but documented CT 1 week prior. Concern for potential lymphoma. 5. Aortic atherosclerosis.  No aneurysm or dissection evident. Foci of coronary artery calcification noted."  Most recent lab results (03/06/2019) of CBC is as follows: all values are WNL except for Platelets at 72, Eosinophils Absolute at 0.6,Abs Immature Granulocytes at 0.08 .  On review of systems, pt reports rashes, digestive issues and denies back pain, abdominal pain and any other symptoms.   On PMHx the pt reports Hypothyroidism, MCI, Hypercholesterolemia, Tracheal anomaly, Hypertension, Pulmonary nodule, Atherosclerosis of aorta, anxiety, gait disorder, primary insomnia.   INTERVAL HISTORY:  Sarah [name] is a 87 y.o. female here for evaluation and management of Angioimmunoblastic T-cell lymphoma. The patient's last visit with [place] was on 06/01/2020. The pt reports that she is doing well overall. We are joined today by her daughter.  The pt reports no new concerns or symptoms. She notes that her memory is gradually becoming worse. The pt notes no issues with sleeping while using Ambien and denies Melatonin working for her.   The pt's daughter notes that the pt has not been eating well or enough calories. The pt notes she still drinks her daily Green Drink. The pt is slowly losing weight.  Lab results today 06/23/2020 of CBC w/diff and CMP is as follows: all values are WNL except for WBC at 3.6K, Lymphs Abs of 0.6K, BUN of 24. 06/23/2020 LDH is . Lab Results  Component Value Date   LDH 262 (H) 06/23/2020   On review of systems, pt reports decreased appetite and gradual weight loss and denies rashes, fevers, chills, night sweats, changes in bowel or urination habits, abdominal pain, new lumps/bumps, SOB, back pain and any other symptoms.  MEDICAL HISTORY:  Past Medical History:  Diagnosis Date  . Allergy    codeine, thiazides  . Anxiety  new dx  . Arthritis   . Breast cancer ([place]) 03/14/12   bx=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,ER/PR=+,upper inner quad  . Cancer ([place])    breast   . Cataract   . DVT (deep venous thrombosis) ([place]) 02/2019   left leg  . Dyspnea   . Edema    both legs feet and toe, abdomen  . Glaucoma    laser treated years ago  . HOH (hard of hearing)   . Hyperlipemia   . Hypertension   . Hypothyroidism   . Pancreatic cyst    benign  . PONV (postoperative nausea and vomiting)   . Pulmonary embolism ([place]) 02/2019   bilateral   . Radiation 06/11/2012-07/12/2012   17 sessions 4250 cGy, 3 sessions 750 cGy  . Vertigo   . Wears glasses   . Wears partial dentures    partial upper     SURGICAL HISTORY: Past Surgical History:  Procedure Laterality Date  . ABDOMINAL HYSTERECTOMY  1966   1/2 ovary left in   . BREAST SURGERY  1992   lumpectomy-lt  . CATARACT EXTRACTION     b/l  . COLONOSCOPY    . EXCISION MASS NECK Left 03/17/2019    EXCISION MASS NECK (Left Neck)  . EXCISION MASS NECK Left 03/17/2019   Procedure: EXCISION MASS NECK;  Surgeon: Helayne Seminole, MD;  Location: [facility];  Service: ENT;  Laterality: Left;  . EYE SURGERY Bilateral    bilateral cataract removal  . IR IMAGING GUIDED PORT INSERTION  04/23/2019  . JOINT REPLACEMENT  2013   rt total knee  . JOINT REPLACEMENT  1995   lt total knee  . PARTIAL MASTECTOMY WITH NEEDLE LOCALIZATION  04/09/2012   Procedure: PARTIAL MASTECTOMY WITH NEEDLE LOCALIZATION;  Surgeon: Adin Hector, MD;  Location: [facility];  Service: General;  Laterality: Right;  . SKIN BIOPSY Left 03/17/2019   LEFT THIGH  . SKIN BIOPSY Left 03/17/2019   Procedure: Skin Biopsy Left Thigh;  Surgeon: Helayne Seminole, MD;  Location: [facility];  Service: ENT;  Laterality: Left;  . TONSILLECTOMY    . TOTAL KNEE ARTHROPLASTY  08/16/2011   Procedure: TOTAL KNEE ARTHROPLASTY;  Surgeon: Ninetta Lights, MD;  Location: [facility];  Service: Orthopedics;  Laterality: Right;     SOCIAL HISTORY: Social History   Socioeconomic History  . Marital status: Widowed    Spouse name: Not on file  .  Number of children: 2  . Years of education: Not on file  . Highest education level: Not on file  Occupational History  . Occupation: Retired    Comment: Community education officer   Tobacco Use  . Smoking status: Never Smoker  . Smokeless tobacco: Never Used  Vaping Use  . Vaping Use: Never used  Substance and Sexual Activity  . Alcohol use: No  . Drug use: No  . Sexual activity: Not Currently  Other Topics Concern  . Not on file  Social History Narrative  . Not on file   Social Determinants of Health   Financial Resource Strain: Not on file  Food Insecurity: Not on file  Transportation Needs: Not on file  Physical Activity: Not on file  Stress: Not on file  Social Connections: Not on file  Intimate Partner Violence: Not on file     FAMILY HISTORY: Family History  Problem Relation Age of Onset  . Heart disease Father   . Cancer Maternal Aunt  stomach     ALLERGIES:   is allergic to citalopram, alendronate sodium, amoxicillin-pot clavulanate, codeine, latex, lisinopril, septra [sulfamethoxazole-trimethoprim], and thiazide-type diuretics.   MEDICATIONS:  Current Outpatient Medications  Medication Sig Dispense Refill  . amLODipine (NORVASC) 2.5 MG tablet Take 2.5-5 mg by mouth See admin instructions. 5 mg in the morning, 2.5 mg in the evening    . amoxicillin (AMOXIL) 500 MG capsule Take 4 capsules (2,000 mg total) by mouth once as needed for up to 1 dose. 60 mins prior to dental procedure 4 capsule 0  . apixaban (ELIQUIS) 2.5 MG TABS tablet Take 1 tablet (2.5 mg total) by mouth 2 (two) times daily. 60 tablet 5  . benzonatate (TESSALON) 100 MG capsule Take 1 capsule (100 mg total) by mouth 3 (three) times daily as needed for cough. 30 capsule 0  . Cholecalciferol (VITAMIN D) 50 MCG (2000 UT) tablet Take 2,000 Units by mouth 2 (two) times daily.    . clonazepam (KLONOPIN) 0.125 MG disintegrating tablet Take 1 tablet (0.125 mg total) by mouth 2 (two) times daily. 60 tablet  0  . Cyanocobalamin (VITAMIN B-12 PO) Take 3,000 mcg by mouth daily.     Marland Kitchen levothyroxine (SYNTHROID) 25 MCG tablet Take 25 mcg by mouth daily before breakfast.     . lidocaine-prilocaine (EMLA) cream Apply to affected area once 30 g 3  . LORazepam (ATIVAN) 0.5 MG tablet TAKE 1/2 TO 1 (ONE-HALF TO ONE) TABLET BY MOUTH EVERY 8 HOURS 30 tablet 0  . Multiple Vitamins-Minerals (PRESERVISION AREDS PO) Take 1 capsule by mouth 2 (two) times daily.     . ondansetron (ZOFRAN) 8 MG tablet Take 1 tablet (8 mg total) by mouth 2 (two) times daily as needed. Start on the third day after chemotherapy. 30 tablet 1  . polyethylene glycol (MIRALAX / GLYCOLAX) 17 g packet Take 17 g by mouth daily.    . prochlorperazine (COMPAZINE) 10 MG tablet Take 1 tablet (10 mg total) by mouth every 6 (six) hours as needed (Nausea or vomiting). 30 tablet 1  . sodium chloride (MURO 128) 5 % ophthalmic ointment Place 1 application into both eyes at bedtime.    Marland Kitchen XIIDRA 5 % SOLN Place 1 drop into both eyes daily as needed (dry eyes).     Marland Kitchen zolpidem (AMBIEN) 5 MG tablet Take 5 mg by mouth at bedtime as needed for sleep.      No current facility-administered medications for this visit.     REVIEW OF SYSTEMS:   10 Point review of Systems was done is negative except as noted above.  PHYSICAL EXAMINATION: ECOG FS:1 - Symptomatic but completely ambulatory  Vitals:   06/23/20 0930  BP: 117/76  Pulse: 84  Resp: 18  Temp: (!) 97.5 F (36.4 C)  SpO2: 99%   Wt Readings from Last 3 Encounters:  06/23/20 112 lb (50.8 kg)  06/01/20 114 lb 9.6 oz (52 kg)  05/11/20 113 lb 14.4 oz (51.7 kg)   Body mass index is 18.08 kg/m.    Exam was given in a chair.   GENERAL:alert, in no acute distress and comfortable SKIN: no acute rashes, no significant lesions EYES: conjunctiva are pink and non-injected, sclera anicteric OROPHARYNX: MMM, no exudates, no oropharyngeal erythema or ulceration NECK: supple, no JVD LYMPH:  no palpable  lymphadenopathy in the cervical, axillary or inguinal regions LUNGS: clear to auscultation b/l with normal respiratory effort HEART: regular rate & rhythm ABDOMEN:  normoactive bowel sounds , non tender, not  distended. Extremity: no pedal edema. Minimal leg swelling. PSYCH: alert & oriented x 3 with fluent speech NEURO: no focal motor/sensory deficits  LABORATORY DATA:  I have reviewed the data as listed  CBC Latest Ref Rng & Units 06/01/2020 05/11/2020 04/13/2020  WBC 4.0 - 10.5 K/uL 4.1 5.2 3.7(L)  Hemoglobin 12.0 - 15.0 g/dL 13.3 13.7 13.4  Hematocrit 36.0 - 46.0 % 40.4 41.0 39.7  Platelets 150 - 400 K/uL 186 214 181    CMP Latest Ref Rng & Units 06/01/2020 05/11/2020 04/13/2020  Glucose 70 - 99 mg/dL 121(H) 124(H) 88  BUN 8 - 23 mg/dL 26(H) 31(H) 21  Creatinine 0.44 - 1.00 mg/dL 0.74 0.82 0.70  Sodium 135 - 145 mmol/L 143 143 143  Potassium 3.5 - 5.1 mmol/L 4.0 4.0 3.9  Chloride 98 - 111 mmol/L 105 105 107  CO2 22 - 32 mmol/L 28 29 25   Calcium 8.9 - 10.3 mg/dL 9.6 9.9 9.6  Total Protein 6.5 - 8.1 g/dL 6.8 7.1 6.8  Total Bilirubin 0.3 - 1.2 mg/dL 0.5 0.6 0.5  Alkaline Phos 38 - 126 U/L 81 101 72  AST 15 - 41 U/L 16 18 17   ALT 0 - 44 U/L 12 15 12    . Lab Results  Component Value Date   LDH 219 (H) 06/01/2020    Component     Latest Ref Rng & Units 03/27/2019  Hepatitis B Surface Ag     NON REACTIVE NON REACTIVE  Hep B Core Total Ab     NON REACTIVE NON REACTIVE  HCV Ab     NON REACTIVE NON REACTIVE  LDH     98 - 192 U/L 252 (H)   . Lab Results  Component Value Date   LDH 219 (H) 06/01/2020      04/08/2019 NM PET Image Initial (PI) Skull Base To Thigh (Accession [id number])  04/04/2019 PATHOLOGY   04/07/2019 ECHOCARDIOGRAM  03/28/2019 PATHOLOGY   03/06/2019 CT ANGIOGRAPHY CHEST WITH CONTRAST (Accession [id number])  04/07/2019 ECHOCARDIOGRAM  03/06/2019 ECHOCARDIOGRAM     03/06/2019 PORTABLE CHEST 1 VIEW (Accession [id number])    02/27/2019 CT  CHEST, ABDOMEN, AND PELVIS WITH CONTRAST (Accession [id number])    02/17/2019 CHEST XRAY - 2 VIEW (Accession [id number])    RADIOGRAPHIC STUDIES: I have personally reviewed the radiological images as listed and agreed with the findings in the report. No results found.   ASSESSMENT & PLAN:   LESA VANDALL is a 87 y.o. female with:  1. 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 SKIN, LEFT THIGH, BIOPSY: - Involvement by angioimmunoblastic T-cell lymphoma." -04/07/2019 Echocardiogram - normal EF -04/08/2019 NM PET Image Initial (PI) Skull Base To Thigh (Accession [id number]) revealed "1. Widespread hypermetabolic adenopathy in the neck, chest, and abdomen/pelvis, primarily in the Deauville 4 and Deauville 5 range. There is also hypermetabolic activity along the posterior nasopharynx in lingual tonsillar regions potentially indicating sites of involvement. 2. The subtle hypodensities in the spine and bony pelvis are not appreciably hypermetabolic may be incidental, surveillance is suggested. 3. Bilateral thyroid activity but especially on the left. Probably from thyroiditis. 4. A 6 mm subpleural nodule anteriorly in the right lower lobe not appreciably hypermetabolic but is below sensitive PET-CT size thresholds and merits surveillance. 5. Mildly accentuated diffuse splenic activity without splenomegaly or focal splenic lesion identified. 6. Other imaging findings of potential clinical significance: Aortic Atherosclerosis (ICD10-I70.0). Coronary atherosclerosis. Large right  and small left pleural effusions. Moderate cardiomegaly. Small pericardial effusion. Mild mesenteric edema. Large left renal cyst. Fullness of both renal collecting systems with suspected nonobstructive left nephrolithiasis. Degenerative grade 1 anterolisthesis at L4-5 at  L5-S1. Trace pelvic ascites." -05/28/2019 PET/CT ([id number]) revealed "Complete metabolic response to therapy". -09/16/2019 PET/CT ([id number]) revealed "1. Unfortunately evidence of lymphoma recurrence at sites of previous hypermetabolic adenopathy. Hypermetabolic lymphoid tissue is new from comparison PET-CT 05/28/2019 but at same locations as PET-CT 04/08/2019. The number of metastatic lymph nodes is less than 04/08/2019. Lymphoid tissue at the RIGHT skull base is larger. 2. Three foci of hypermetabolic recurrence are present, lymphoid tissue at the RIGHT base of skull, hypermetabolic RIGHT hilar lymph node and hypermetabolic lymph node in the porta hepatis." -11/25/2019 PET/CT ([id number]) revealed "1. Marked interval improvement in the hypermetabolic disease identified previously in the posterior right nasopharynx, compatible with Deauville 4 today. 2. Decreased hypermetabolism in the right hilar and porta hepatis lymph nodes identified previously (Deauville 3). No new sites of hypermetabolic disease in the chest, abdomen, or pelvis. 3. Similar diffuse thyroid uptake, left greater than right. 4. No substantial change in right-sided pulmonary nodules." -04/09/2020 PET/CT ([id number]) revealed "No evidence progressive lymphoma."  2. Pulmonary Embolism -extensive .likely related to extensive malignancy  3. H/o Breast Cancer  -The patient had bilateral diagnostic  mammography at [facility] 07/11/2011. This  showed some calcifications in the right  breast, which seemed a little bit more  prominent than prior. In the left breast  there was an area of possible  architectural distortion. However left  breast ultrasound the same day showed  no abnormality. 6 month followup was  suggested, and on 02/28/2012 the  patient again had bilateral diagnostic  mammography, now with right  ultrasonography. The microcalcifications  in the right breast appeared increased.  Ultrasound showed a hypoechoic lesion  measuring 5 mm,  with no associated  shadowing. This had been previously  noted and appeared unchanged. Anterior  to that there was a small hypoechoic  mass measuring 4 mm in diameter.  There was felt to be suspicious, and on  03/14/2012 the patient underwent biopsy  of the right breast mass, showing (SAA  [id number]) ductal carcinoma in situ,  intermediate grade, estrogen receptor  100% and progesterone receptor 100%  positive.   -Bilateral breast MRI was obtained  03/26/2012. This showed only post  biopsy changes in the right breast,  associated with an area of non-masslike  enhancement measuring 2.4 cm. The  left breast was unremarkable, and there  was no enlarged axillary or internal  mammary adenopathy noted.  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 The patient's  subsequent history is as detailed below.   PLAN: -Discussed pt labwork today, 06/23/2020; blood counts normal and chemistries stable. LDH pending. -Advised pt that Ambien causes sleep-dependance, but does not have any physical dependence as prevalent in narcotics. -Advised pt to increase caloric intake outside of the [employer] she does daily. Advised pt to eat 25-50% more calories than baseline minimum while on treatment. 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 -The pt has no prohibitive toxicities from continuing C19 Brentuximab at this time. -Continue 2.5 mg Eliquis BID. -Advise pt we can Rx Marinol for appetite and nausea. The pt wishes to hold off at this time. -Will  see back with C20.    FOLLOW-UP: -Plz schedule for next 2 cycles of chemotherapy as per orders with portflush, labs and MD visits.   The total time spent in the appointment was 20 minutes and more than 50% was on counseling and direct patient cares.  All of the  patient's questions were answered with apparent satisfaction. The patient knows to call the clinic with any problems, questions or concerns.    Sullivan Lone MD [place] AAHIVMS [facility] Hematology/Oncology Physician [facility]  (Office):       [phone] (Work cell):  [phone] (Fax):           [phone]  06/22/2020 9:02 AM   I, Reinaldo Raddle, am acting as scribe for Dr. Sullivan Lone, MD.   .I have reviewed the above documentation for accuracy and completeness, and I agree with the above.  Brunetta Genera MD

## 2020-06-22 NOTE — Progress Notes (Signed)
Subjective:  Patient ID: Sarah [name], female    DOB: Oct 18, 1932,  MRN: [id number]  87 y.o. female presents with painful thick toenails that are difficult to trim. Pain interferes with ambulation. Aggravating factors include wearing enclosed shoe gear. Pain is relieved with periodic professional debridement.   Her daughter is present during today's visit.  PCP is Dr. Lajean Manes. Last visit was 04/07/2020.  She is also on the blood thinner, Eliquis. She voices no new pedal problems on today's visit.    Review of Systems: Negative except as noted in the HPI. Denies N/V/F/Ch. Past Medical History:  Diagnosis Date  . Allergy    codeine, thiazides  . Anxiety    new dx  . Arthritis   . Breast cancer ([place]) 03/14/12   bx=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,ER/PR=+,upper inner quad  . Cancer ([place])    breast  . Cataract   . DVT (deep venous thrombosis) ([place]) 02/2019   left leg  . Dyspnea   . Edema    both legs feet and toe, abdomen  . Glaucoma    laser treated years ago  . HOH (hard of hearing)   . Hyperlipemia   . Hypertension   . Hypothyroidism   . Pancreatic cyst    benign  . PONV (postoperative nausea and vomiting)   . Pulmonary embolism ([place]) 02/2019   bilateral   . Radiation 06/11/2012-07/12/2012   17 sessions 4250 cGy, 3 sessions 750 cGy  . Vertigo   . Wears glasses   . Wears partial dentures    partial upper   Past Surgical History:  Procedure Laterality Date  . ABDOMINAL HYSTERECTOMY  1966   1/2 ovary left in   . BREAST SURGERY  1992   lumpectomy-lt  . CATARACT EXTRACTION     b/l  . COLONOSCOPY    . EXCISION MASS NECK Left 03/17/2019    EXCISION MASS NECK (Left Neck)  . EXCISION MASS NECK Left 03/17/2019   Procedure: EXCISION MASS NECK;  Surgeon: Helayne Seminole, MD;  Location: [facility];  Service: ENT;  Laterality: Left;  . EYE SURGERY Bilateral    bilateral cataract removal  . IR IMAGING GUIDED PORT INSERTION  04/23/2019  .  JOINT REPLACEMENT  2013   rt total knee  . JOINT REPLACEMENT  1995   lt total knee  . PARTIAL MASTECTOMY WITH NEEDLE LOCALIZATION  04/09/2012   Procedure: PARTIAL MASTECTOMY WITH NEEDLE LOCALIZATION;  Surgeon: Adin Hector, MD;  Location: [facility];  Service: General;  Laterality: Right;  . SKIN BIOPSY Left 03/17/2019   LEFT THIGH  . SKIN BIOPSY Left 03/17/2019   Procedure: Skin Biopsy Left Thigh;  Surgeon: Helayne Seminole, MD;  Location: [facility];  Service: ENT;  Laterality: Left;  . TONSILLECTOMY    . TOTAL KNEE ARTHROPLASTY  08/16/2011   Procedure: TOTAL KNEE ARTHROPLASTY;  Surgeon: Ninetta Lights, MD;  Location: [facility];  Service: Orthopedics;  Laterality: Right;   Patient Active Problem List   Diagnosis Date Noted  . Abnormal gait 06/22/2020  . Age-related osteoporosis without current pathological fracture 06/22/2020  . Anxiety 06/22/2020  . Hardening of the aorta (main artery of the heart) ([place]) 06/22/2020  . Malignant lymphoma, non-Hodgkin's ([place]) 06/22/2020  . Mild cognitive disorder 06/22/2020  . Non-pressure chronic ulcer of unspecified part of unspecified lower leg with unspecified severity ([place]) 06/22/2020  . Primary insomnia 06/22/2020  . Pulmonary nodule 06/22/2020  . 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 Abnormal  chest x-ray 12/31/2019  . Cough 12/31/2019  . Port-A-Cath in place 06/04/2019  . 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 Counseling regarding advance care planning and goals of care 04/14/2019  . Lymphadenopathy of head and neck 03/17/2019  . Lymph nodes enlarged 03/17/2019  . History of pulmonary embolus (PE) 03/17/2019  . Acute pulmonary embolism ([place]) 03/06/2019  . Diffuse lymphadenopathy 03/06/2019  . Pleural effusion 03/06/2019  . Pruritus 02/06/2019  . Pruritic rash 02/06/2019  . Angio-edema 02/06/2019  . Shortness of breath 02/06/2019  . Osteopenia 12/18/2013  . Heart block AV first degree 12/18/2013  . Breast cancer of  upper-inner quadrant of right female breast ([place]) 03/19/2012  . Right knee DJD 08/18/2011  . Hypothyroidism   . Hypertension   . PONV (postoperative nausea and vomiting)   . Hyperlipemia     Current Outpatient Medications:  .  amLODipine (NORVASC) 2.5 MG tablet, Take 2.5-5 mg by mouth See admin instructions. 5 mg in the morning, 2.5 mg in the evening, Disp: , Rfl:  .  amoxicillin (AMOXIL) 500 MG capsule, Take 4 capsules (2,000 mg total) by mouth once as needed for up to 1 dose. 60 mins prior to dental procedure, Disp: 4 capsule, Rfl: 0 .  apixaban (ELIQUIS) 2.5 MG TABS tablet, Take 1 tablet (2.5 mg total) by mouth 2 (two) times daily., Disp: 60 tablet, Rfl: 5 .  benzonatate (TESSALON) 100 MG capsule, Take 1 capsule (100 mg total) by mouth 3 (three) times daily as needed for cough., Disp: 30 capsule, Rfl: 0 .  Cholecalciferol (VITAMIN D) 50 MCG (2000 UT) tablet, Take 2,000 Units by mouth 2 (two) times daily., Disp: , Rfl:  .  clonazepam (KLONOPIN) 0.125 MG disintegrating tablet, Take 1 tablet (0.125 mg total) by mouth 2 (two) times daily., Disp: 60 tablet, Rfl: 0 .  Cyanocobalamin (VITAMIN B-12 PO), Take 3,000 mcg by mouth daily. , Disp: , Rfl:  .  levothyroxine (SYNTHROID) 25 MCG tablet, Take 25 mcg by mouth daily before breakfast. , Disp: , Rfl:  .  lidocaine-prilocaine (EMLA) cream, Apply to affected area once, Disp: 30 g, Rfl: 3 .  LORazepam (ATIVAN) 0.5 MG tablet, TAKE 1/2 TO 1 (ONE-HALF TO ONE) TABLET BY MOUTH EVERY 8 HOURS, Disp: 30 tablet, Rfl: 0 .  Multiple Vitamins-Minerals (PRESERVISION AREDS PO), Take 1 capsule by mouth 2 (two) times daily. , Disp: , Rfl:  .  ondansetron (ZOFRAN) 8 MG tablet, Take 1 tablet (8 mg total) by mouth 2 (two) times daily as needed. Start on the third day after chemotherapy., Disp: 30 tablet, Rfl: 1 .  polyethylene glycol (MIRALAX / GLYCOLAX) 17 g packet, Take 17 g by mouth daily., Disp: , Rfl:  .  prochlorperazine (COMPAZINE) 10 MG tablet, Take 1 tablet (10  mg total) by mouth every 6 (six) hours as needed (Nausea or vomiting)., Disp: 30 tablet, Rfl: 1 .  sodium chloride (MURO 128) 5 % ophthalmic ointment, Place 1 application into both eyes at bedtime., Disp: , Rfl:  .  XIIDRA 5 % SOLN, Place 1 drop into both eyes daily as needed (dry eyes). , Disp: , Rfl:  .  zolpidem (AMBIEN) 5 MG tablet, Take 5 mg by mouth at bedtime as needed for sleep. , Disp: , Rfl:  Allergies  Allergen Reactions  . Citalopram Anxiety  . Alendronate Sodium Other (See Comments)  . Amoxicillin-Pot Clavulanate Other (See Comments)  . Codeine Other (See Comments)    Reaction not recalled  . Latex   .  Lisinopril Other (See Comments)  . Septra [Sulfamethoxazole-Trimethoprim] Other (See Comments)  . Thiazide-Type Diuretics Other (See Comments)    Blurry vision?? (patient stated she has macular degeneration)   Social History   Tobacco Use  Smoking Status Never Smoker  Smokeless Tobacco Never Used    Objective:   [place] Ms. Hollenberg is a pleasant 87 y.o. Caucasian female, WD, WN in NAD.Marland Kitchen  Vascular Neurovascular status unchanged b/l lower extremities. Capillary refill time to digits immediate b/l. Palpable pedal pulses b/l LE. Pedal hair absent. Lower extremity skin temperature gradient within normal limits. No pain with calf compression b/l. Patient wearing compression hose on today's visit. No cyanosis or clubbing noted.  Neurologic Normal speech. Oriented to person, place, and time. Protective sensation intact 5/5 intact bilaterally with 10g monofilament b/l. Vibratory sensation intact b/l. Clonus negative b/l.  Dermatologic Pedal skin with normal turgor, texture and tone bilaterally. No open wounds bilaterally. No interdigital macerations bilaterally. Toenails 2-5 bilaterally and L hallux elongated, discolored, dystrophic, thickened, and crumbly with subungual debris and tenderness to dorsal palpation. Anonychia noted R hallux. Nailbed(s) epithelialized.    Orthopedic: Normal muscle strength 5/5 to all lower extremity muscle groups bilaterally. No pain crepitus or joint limitation noted with ROM b/l. Hammertoes noted to the 2-5 bilaterally.   Radiographs: None Assessment:   1. Pain due to onychomycosis of toenails of both feet   2. Hammer toes of both feet    Plan:  Patient was evaluated and treated and all questions answered.  Onychomycosis with pain -Nails palliatively debridement as below. -Educated on self-care  Procedure: Nail Debridement Rationale: Pain Type of Debridement: manual, sharp debridement. Instrumentation: Nail nipper, rotary burr. Number of Nails: 9 -Examined patient. -No new findings. No new orders. -Toenails 2-5 bilaterally and L hallux debrided in length and girth without iatrogenic bleeding with sterile nail nipper and dremel.  -Patient to report any pedal injuries to medical professional immediately. -Patient to continue soft, supportive shoe gear daily. -Patient/POA to call should there be question/concern in the interim.  Return in about 3 months (around 09/19/2020).  Marzetta Board, DPM

## 2020-06-23 ENCOUNTER — Inpatient Hospital Stay: Payer: Medicare Other | Attending: Hematology

## 2020-06-23 ENCOUNTER — Other Ambulatory Visit: Payer: Self-pay

## 2020-06-23 ENCOUNTER — Inpatient Hospital Stay: Payer: Medicare Other

## 2020-06-23 ENCOUNTER — Inpatient Hospital Stay (HOSPITAL_BASED_OUTPATIENT_CLINIC_OR_DEPARTMENT_OTHER): Payer: Medicare Other | Admitting: Hematology

## 2020-06-23 VITALS — BP 117/76 | HR 84 | Temp 97.5°F | Resp 18 | Ht 66.0 in | Wt 112.0 lb

## 2020-06-23 DIAGNOSIS — C865 Angioimmunoblastic T-cell lymphoma: Secondary | ICD-10-CM | POA: Insufficient documentation

## 2020-06-23 DIAGNOSIS — C8440 Peripheral T-cell lymphoma, not classified, unspecified site: Secondary | ICD-10-CM

## 2020-06-23 DIAGNOSIS — Z5112 Encounter for antineoplastic immunotherapy: Secondary | ICD-10-CM | POA: Diagnosis not present

## 2020-06-23 DIAGNOSIS — Z95828 Presence of other vascular implants and grafts: Secondary | ICD-10-CM

## 2020-06-23 DIAGNOSIS — R197 Diarrhea, unspecified: Secondary | ICD-10-CM

## 2020-06-23 DIAGNOSIS — Z7189 Other specified counseling: Secondary | ICD-10-CM

## 2020-06-23 DIAGNOSIS — Z5111 Encounter for antineoplastic chemotherapy: Secondary | ICD-10-CM

## 2020-06-23 LAB — CBC WITH DIFFERENTIAL/PLATELET
Abs Immature Granulocytes: 0.01 10*3/uL (ref 0.00–0.07)
Basophils Absolute: 0 10*3/uL (ref 0.0–0.1)
Basophils Relative: 1 %
Eosinophils Absolute: 0.1 10*3/uL (ref 0.0–0.5)
Eosinophils Relative: 2 %
HCT: 41.5 % (ref 36.0–46.0)
Hemoglobin: 13.5 g/dL (ref 12.0–15.0)
Immature Granulocytes: 0 %
Lymphocytes Relative: 17 %
Lymphs Abs: 0.6 10*3/uL — ABNORMAL LOW (ref 0.7–4.0)
MCH: 29.4 pg (ref 26.0–34.0)
MCHC: 32.5 g/dL (ref 30.0–36.0)
MCV: 90.4 fL (ref 80.0–100.0)
Monocytes Absolute: 0.5 10*3/uL (ref 0.1–1.0)
Monocytes Relative: 14 %
Neutro Abs: 2.4 10*3/uL (ref 1.7–7.7)
Neutrophils Relative %: 66 %
Platelets: 196 10*3/uL (ref 150–400)
RBC: 4.59 MIL/uL (ref 3.87–5.11)
RDW: 14.8 % (ref 11.5–15.5)
WBC: 3.6 10*3/uL — ABNORMAL LOW (ref 4.0–10.5)
nRBC: 0 % (ref 0.0–0.2)

## 2020-06-23 LAB — CMP (CANCER CENTER ONLY)
ALT: 13 U/L (ref 0–44)
AST: 18 U/L (ref 15–41)
Albumin: 4 g/dL (ref 3.5–5.0)
Alkaline Phosphatase: 73 U/L (ref 38–126)
Anion gap: 9 (ref 5–15)
BUN: 24 mg/dL — ABNORMAL HIGH (ref 8–23)
CO2: 27 mmol/L (ref 22–32)
Calcium: 9.4 mg/dL (ref 8.9–10.3)
Chloride: 106 mmol/L (ref 98–111)
Creatinine: 0.71 mg/dL (ref 0.44–1.00)
GFR, Estimated: >60 mL/min (ref >60)
Glucose, Bld: 93 mg/dL (ref 70–99)
Potassium: 3.8 mmol/L (ref 3.5–5.1)
Sodium: 142 mmol/L (ref 135–145)
Total Bilirubin: 0.6 mg/dL (ref 0.3–1.2)
Total Protein: 6.8 g/dL (ref 6.5–8.1)

## 2020-06-23 LAB — LACTATE DEHYDROGENASE: LDH: 262 U/L — ABNORMAL HIGH (ref 98–192)

## 2020-06-23 MED ORDER — DIPHENHYDRAMINE HCL 50 MG/ML IJ SOLN
50.0000 mg | Freq: Once | INTRAMUSCULAR | Status: AC
  Administered 2020-06-23: 50 mg via INTRAVENOUS

## 2020-06-23 MED ORDER — SODIUM CHLORIDE 0.9% FLUSH
10.0000 mL | Freq: Once | INTRAVENOUS | Status: AC
  Administered 2020-06-23: 10 mL
  Filled 2020-06-23: qty 10

## 2020-06-23 MED ORDER — DIPHENHYDRAMINE HCL 50 MG/ML IJ SOLN
INTRAMUSCULAR | Status: AC
  Filled 2020-06-23: qty 1

## 2020-06-23 MED ORDER — ACETAMINOPHEN 325 MG PO TABS
650.0000 mg | ORAL_TABLET | Freq: Once | ORAL | Status: AC
Start: 2020-06-23 — End: 2020-06-23
  Administered 2020-06-23: 650 mg via ORAL

## 2020-06-23 MED ORDER — HEPARIN SOD (PORK) LOCK FLUSH 100 UNIT/ML IV SOLN
500.0000 [IU] | Freq: Once | INTRAVENOUS | Status: AC | PRN
Start: 2020-06-23 — End: 2020-06-23
  Administered 2020-06-23: 500 [IU]
  Filled 2020-06-23: qty 5

## 2020-06-23 MED ORDER — FAMOTIDINE IN NACL 20-0.9 MG/50ML-% IV SOLN
INTRAVENOUS | Status: AC
  Filled 2020-06-23: qty 50

## 2020-06-23 MED ORDER — SODIUM CHLORIDE 0.9% FLUSH
10.0000 mL | INTRAVENOUS | Status: DC | PRN
  Administered 2020-06-23: 10 mL
  Filled 2020-06-23: qty 10

## 2020-06-23 MED ORDER — ONDANSETRON HCL 4 MG/2ML IJ SOLN
INTRAMUSCULAR | Status: AC
  Filled 2020-06-23: qty 2

## 2020-06-23 MED ORDER — ACETAMINOPHEN 325 MG PO TABS
ORAL_TABLET | ORAL | Status: AC
  Filled 2020-06-23: qty 2

## 2020-06-23 MED ORDER — DEXAMETHASONE SODIUM PHOSPHATE 10 MG/ML IJ SOLN
INTRAMUSCULAR | Status: AC
  Filled 2020-06-23: qty 1

## 2020-06-23 MED ORDER — SODIUM CHLORIDE 0.9 % IV SOLN
1.8000 mg/kg | Freq: Once | INTRAVENOUS | Status: AC
  Administered 2020-06-23: 100 mg via INTRAVENOUS
  Filled 2020-06-23: qty 20

## 2020-06-23 MED ORDER — SODIUM CHLORIDE 0.9 % IV SOLN
Freq: Once | INTRAVENOUS | Status: AC
  Filled 2020-06-23: qty 250

## 2020-06-23 MED ORDER — FAMOTIDINE IN NACL 20-0.9 MG/50ML-% IV SOLN
20.0000 mg | Freq: Once | INTRAVENOUS | Status: AC
  Administered 2020-06-23: 20 mg via INTRAVENOUS

## 2020-06-23 MED ORDER — ONDANSETRON HCL 4 MG/2ML IJ SOLN
4.0000 mg | Freq: Once | INTRAMUSCULAR | Status: AC
  Administered 2020-06-23: 4 mg via INTRAVENOUS

## 2020-06-23 MED ORDER — DEXAMETHASONE SODIUM PHOSPHATE 10 MG/ML IJ SOLN
5.0000 mg | Freq: Once | INTRAMUSCULAR | Status: AC
  Administered 2020-06-23: 5 mg via INTRAVENOUS

## 2020-06-23 NOTE — Progress Notes (Signed)
Dose at 10%, keep at 100mg  vs 99mg 

## 2020-06-23 NOTE — Patient Instructions (Signed)
[facility] Discharge Instructions for Patients Receiving Chemotherapy  Today you received the following chemotherapy agents [facility]  To help prevent nausea and vomiting after your treatment, we encourage you to take your nausea medication as directed.    If you develop nausea and vomiting that is not controlled by your nausea medication, call the clinic.   BELOW ARE SYMPTOMS THAT SHOULD BE REPORTED IMMEDIATELY:  *FEVER GREATER THAN 100.5 F  *CHILLS WITH OR WITHOUT FEVER  NAUSEA AND VOMITING THAT IS NOT CONTROLLED WITH YOUR NAUSEA MEDICATION  *UNUSUAL SHORTNESS OF BREATH  *UNUSUAL BRUISING OR BLEEDING  TENDERNESS IN MOUTH AND THROAT WITH OR WITHOUT PRESENCE OF ULCERS  *URINARY PROBLEMS  *BOWEL PROBLEMS  UNUSUAL RASH Items with * indicate a potential emergency and should be followed up as soon as possible.  Feel free to call the clinic should you have any questions or concerns. The clinic phone number is [phone].  Please show the [facility] [facility] at check-in to the Emergency Department and triage nurse.

## 2020-06-23 NOTE — Patient Instructions (Signed)

## 2020-07-05 ENCOUNTER — Other Ambulatory Visit: Payer: Self-pay

## 2020-07-05 MED ORDER — APIXABAN 2.5 MG PO TABS: 2.5000 mg | ORAL_TABLET | Freq: Two times a day (BID) | ORAL | 5 refills | Status: DC

## 2020-07-11 NOTE — Progress Notes (Signed)
HEMATOLOGY/ONCOLOGY CLINIC NOTE  Date of Service: 07/11/2020  Patient Care Team: Lajean Manes, MD as PCP - General (Internal Medicine) Buford Dresser, MD as PCP - Cardiology (Cardiology)  REFERRING PHYSICIAN: Lajean Manes, MD  CHIEF COMPLAINTS/PURPOSE OF CONSULTATION:  Continue mx of Recently Diagnosed Angioimmunoblastic T cell lymphoma      HISTORY OF PRESENTING ILLNESS:   Sarah [name] is a wonderful 87 y.o. female who has been referred to [place] by Lajean Manes, MD for evaluation and management of Possible lymphoma . She is accompanied today by her daughter Sarah [name] . The pt reports that she is doing well overall.   Pending Surgical pathology from 03/17/2019 Dr.Manny will contact clinic about pathology   August 25th/27th she began coughing and sneezing. On Sept 3 went to doctors and they said it was allergies. She began having rashes (little red dots). Went to PCP and they prescribed prednisone and for several days and the medication helped. But as soon as she completed the prescription symptoms reoccurred.   She had a chest X Ray done because she was having a severe cough on 02/17/2019 revealing "bilateral effusions in this patient with history of breast cancer,of uncertain significance with question of right lower lobe pulmonary nodule, consider chest CT for further assessment. Atherosclerotic changes in the thoracic aorta."  Dr. Amy Martinique biopsied the rashes on her back 02/21/2019 and it was determined that it could be because of an allergy to medication. Dr.Stoneking stopped her blood pressure medication from September- October but ther rash still present. The itching started 1 to 2 months ago. The rash would come and go and was mostly on her back. She still has rash on her left thigh.  She is using triaconazole and cetrizine.  She has lumps behind ear and abdomin and she states that they have swollen twice the size within 1 and half months  She has no appetite and  has changes in her taste that began around August. She has changes in bowels. They are more loose and smaller In size. She also has no control over bowels.  She feels fatigued and it has increased since August.   She has a cough that starts in September  that has made her hoarse.   She had a CT scan on 02/27/2019 because she was having severe cough and her stomach was very extended. Revealing "1. Extensive lymphadenopathy throughout the chest, abdomen, and pelvis, with index lymph nodes identified above. Splenomegaly. Findings are most consistent with lymphoma with recurrent, metastatic breast malignancy less favored. 2. There is generally osteopenic appearance of the skeleton. There are numerous subtle hypodense lesions, particularly of the pelvis (e.g. Series 2, image 88) and select vertebral bodies, for example L4 (series 8, image 92). This is suspicious although not definitive for osseous metastatic disease. Characterization for metabolic activity by nuclear scintigraphic bone scan or PET-CT would be helpful to further evaluate. 3. Moderate right, small left pleural effusions with associated atelectasis or consolidation. Subpleural radiation fibrosis of the anterior right lung. 4. There is a new subpleural pulmonary nodule of the anterior right middle lobe measuring 6 mm (series 4, image 105), nonspecific. There is no obvious pleural thickening or nodularity definitive for metastatic disease. 5.  Small volume ascites. 6. There is a fluid attenuation lesion of the pancreatic uncinate measuring 2.4 x 1.3 cm (series 2, image 63), not substantially changed when compared to remote prior MR examination dated 02/23/2012 and likely sequelae of a prior pseudocyst versus incidental pancreatic IPMN. 7.  Cardiomegaly."  Hospitalized from 03/06/2019-03/08/2019. She presenting with acute shortness of breath, leg swelling,left hand swelling and some weight gain despite poor appetite. In ED, CTA chest revealed at least  submassive PE, moderate right pleural effusion, diffuse LAD concerning for lymphoma. Started on heparin drip. PCCM consulted for guidance. She had thoracocentesis with removal of 1200 cc cloudy exudative pleural fluid. Cardiology consulted for elevated which was thought to be demand ischemia from right heart strain in the setting of PE. Patient was transitioned to subcu Lovenox by pulmonology the next day and remained stable. Unusual appearance of tracheal air column at the thoracic inlet. Correlate with any history of swallowing difficulty or changes in voice with dedicated imaging with chest CT and or CT of the neck as indicated.  She has a nonhealing ulcer on her right tibialis anterior that starting weeping one week ago. Of note since the patient's last visit, pt has had CHEST XRAY - 2 VIEW (Accession [id number]) completed on 02/17/2019 with results revealing "Bilateral effusions in this patient with history of breast cancer,of uncertain significance with question of right lower lobepulmonary nodule, consider chest CT for furtherassessment.Unusual appearance of tracheal air column at the thoracic inlet.Correlate with any history of swallowing difficulty or changes in voice with dedicated imaging with chest CT and or CT of the neck as Indicated. Atherosclerotic changes in the thoracic aorta."  Of note since the patient's last visit, pt has had CT CHEST, ABDOMEN, AND PELVIS WITH CONTRAST (Accession [id number]) completed on 02/27/2019 with results revealing "1. Extensive lymphadenopathy throughout the chest, abdomen, and pelvis, with index lymph nodes identified above. Splenomegaly.Findings are most consistent with lymphoma with recurrent, metastatic breast malignancy less favored. 2. There is generally osteopenic appearance of the skeleton. There are numerous subtle hypodense lesions, particularly of the pelvis (e.g. Series 2, image 88) and select vertebral bodies, for example L4 (series 8, image 92).  This is suspicious although not definitive for osseous metastatic disease. Characterization for metabolic activity by nuclear scintigraphic bone scan or PET-CT would be helpful to further evaluate.  3. Moderate right, small left pleural effusions with associated atelectasis or consolidation. Subpleural radiation fibrosis of the anterior right lung.4. There is a new subpleural pulmonary nodule of the anterior right middle lobe measuring 6 mm (series 4, image 105), nonspecific. There is no obvious pleural thickening or nodularity definitive for metastatic disease.5.  Small volume ascites. 6. There is a fluid attenuation lesion of the pancreatic uncinate measuring 2.4 x 1.3 cm (series 2, image 63), not substantially changed when compared to remote prior MR examination dated 02/23/2012 and likely sequelae of a prior pseudocyst versus incidental pancreatic IPMN.7.  Cardiomegaly.8. Other chronic and incidental findings as detailed above. Aortic Atherosclerosis (ICD10-I70.0)."  Of note since the patient's last visit, pt has had PORTABLE CHEST 1 VIEW (Accession [id number]) completed on 03/06/2019 with results revealing "1. Moderate size right and small left pleural effusions appear not significantly changed since 02/27/19. 2. No new cardiopulmonary abnormality."  Of note since the patient's last visit, pt has had ECHOCARDIOGRAM  completed on 03/06/2019 with results revealing  "1. Left ventricular ejection fraction, by visual estimation, is 60 to 65%. The left ventricle has normal function. Normal left ventricular size. There is no left ventricular hypertrophy. 2. Left ventricular diastolic Doppler parameters are consistent with impaired relaxation pattern of LV diastolic filling. 3. Global right ventricle has mildly reduced systolic function.The right ventricular size is moderately enlarged. No increase in right ventricular wall thickness. 4. Left atrial size was normal.  5. Right atrial size was mildly dilated.  6. Trivial pericardial effusion is present. 7. The mitral valve is normal in structure. Trace mitral valve regurgitation. No evidence of mitral stenosis. 8. The tricuspid valve is normal in structure. Tricuspid valve regurgitation severe. 9. The aortic valve is normal in structure. Aortic valve regurgitation was not visualized by color flow Doppler. Structurally normal aortic valve, with no evidence of sclerosis or stenosis.10. The pulmonic valve was normal in structure. Pulmonic valve regurgitation is mild by color flow Doppler.11. Mildly elevated pulmonary artery systolic pressure.12. The inferior vena cava is normal in size with greater than 50% respiratory variability, suggesting right atrial pressure of 3 mmHg."  Of note since the patient's last visit, pt has had CT ANGIOGRAPHY CHEST WITH CONTRAST (Accession [id number]) completed on 03/06/2019 with results revealing "1. Pulmonary embolus arising from the distal left main pulmonary artery with extension into multiple left lower lobe pulmonary arterial branches. There is also incompletely obstructing pulmonary embolus in the proximal left upper lobe pulmonary artery. More distal pulmonary embolus in the right lower lobe pulmonary artery. Positive for acute PE with CT evidence of right heart strain (RV/LV Ratio = 1.6) consistent with at least submassive (intermediate risk) PE. The presence of right heart strain has been associated with an increased risk of morbidity and mortality. Please activate Code PE by paging [phone]. 2. Sizable pleural effusion on the right with smaller pleural effusion on the left. Consolidation and compressive atelectasis throughout most of the right lower lobe. Milder atelectasis left base. Nodular opacities on the right, likely metastatic foci.3.  Stable adenopathy compared to 1 week prior.4. Enlargement of spleen, incompletely visualized but documented CT 1 week prior. Concern for potential lymphoma. 5. Aortic atherosclerosis.  No aneurysm or dissection evident. Foci of coronary artery calcification noted."  Most recent lab results (03/06/2019) of CBC is as follows: all values are WNL except for Platelets at 72, Eosinophils Absolute at 0.6,Abs Immature Granulocytes at 0.08 .  On review of systems, pt reports rashes, digestive issues and denies back pain, abdominal pain and any other symptoms.   On PMHx the pt reports Hypothyroidism, MCI, Hypercholesterolemia, Tracheal anomaly, Hypertension, Pulmonary nodule, Atherosclerosis of aorta, anxiety, gait disorder, primary insomnia.   INTERVAL HISTORY:  Sarah [name] is a 87 y.o. female here for evaluation and management of Angioimmunoblastic T-cell lymphoma. The patient's last visit with [place] was on 06/01/2020. The pt reports that she is doing well overall.  The pt reports no new rashes. No fevers/chills/night sweats/weight loss/sob or chest pain.  No new prohibitive toxicities from [facility] at this time.  Lab results today 07/13/2020 of CBC w/diff and CMP stable.  On review of systems, pt reports chronic anxiety and grade 1  Fatigue. No issues with neuropathy. and denies any other symptoms.  MEDICAL HISTORY:  Past Medical History:  Diagnosis Date  . Allergy    codeine, thiazides  . Anxiety    new dx  . Arthritis   . Breast cancer ([place]) 03/14/12   bx=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,ER/PR=+,upper inner quad  . Cancer ([place])    breast  . Cataract   . DVT (deep venous thrombosis) ([place]) 02/2019   left leg  . Dyspnea   . Edema    both legs feet and toe, abdomen  . Glaucoma    laser treated years ago  . HOH (hard of hearing)   . Hyperlipemia   . Hypertension   . Hypothyroidism   . Pancreatic cyst  benign  . PONV (postoperative nausea and vomiting)   . Pulmonary embolism ([place]) 02/2019   bilateral   . Radiation 06/11/2012-07/12/2012   17 sessions 4250 cGy, 3 sessions 750 cGy  . Vertigo   . Wears glasses   . Wears partial  dentures    partial upper     SURGICAL HISTORY: Past Surgical History:  Procedure Laterality Date  . ABDOMINAL HYSTERECTOMY  1966   1/2 ovary left in   . BREAST SURGERY  1992   lumpectomy-lt  . CATARACT EXTRACTION     b/l  . COLONOSCOPY    . EXCISION MASS NECK Left 03/17/2019    EXCISION MASS NECK (Left Neck)  . EXCISION MASS NECK Left 03/17/2019   Procedure: EXCISION MASS NECK;  Surgeon: Helayne Seminole, MD;  Location: [facility];  Service: ENT;  Laterality: Left;  . EYE SURGERY Bilateral    bilateral cataract removal  . IR IMAGING GUIDED PORT INSERTION  04/23/2019  . JOINT REPLACEMENT  2013   rt total knee  . JOINT REPLACEMENT  1995   lt total knee  . PARTIAL MASTECTOMY WITH NEEDLE LOCALIZATION  04/09/2012   Procedure: PARTIAL MASTECTOMY WITH NEEDLE LOCALIZATION;  Surgeon: Adin Hector, MD;  Location: [facility];  Service: General;  Laterality: Right;  . SKIN BIOPSY Left 03/17/2019   LEFT THIGH  . SKIN BIOPSY Left 03/17/2019   Procedure: Skin Biopsy Left Thigh;  Surgeon: Helayne Seminole, MD;  Location: [facility];  Service: ENT;  Laterality: Left;  . TONSILLECTOMY    . TOTAL KNEE ARTHROPLASTY  08/16/2011   Procedure: TOTAL KNEE ARTHROPLASTY;  Surgeon: Ninetta Lights, MD;  Location: [facility];  Service: Orthopedics;  Laterality: Right;     SOCIAL HISTORY: Social History   Socioeconomic History  . Marital status: Widowed    Spouse name: Not on file  . Number of children: 2  . Years of education: Not on file  . Highest education level: Not on file  Occupational History  . Occupation: Retired    Comment: Community education officer   Tobacco Use  . Smoking status: Never Smoker  . Smokeless tobacco: Never Used  Vaping Use  . Vaping Use: Never used  Substance and Sexual Activity  . Alcohol use: No  . Drug use: No  . Sexual activity: Not Currently  Other Topics Concern  . Not on file  Social History Narrative  . Not on file   Social Determinants of Health    Financial Resource Strain: Not on file  Food Insecurity: Not on file  Transportation Needs: Not on file  Physical Activity: Not on file  Stress: Not on file  Social Connections: Not on file  Intimate Partner Violence: Not on file     FAMILY HISTORY: Family History  Problem Relation Age of Onset  . Heart disease Father   . Cancer Maternal Aunt        stomach     ALLERGIES:   is allergic to citalopram, alendronate sodium, amoxicillin-pot clavulanate, codeine, latex, lisinopril, septra [sulfamethoxazole-trimethoprim], and thiazide-type diuretics.   MEDICATIONS:  Current Outpatient Medications  Medication Sig Dispense Refill  . amLODipine (NORVASC) 2.5 MG tablet Take 2.5-5 mg by mouth See admin instructions. 5 mg in the morning, 2.5 mg in the evening    . amoxicillin (AMOXIL) 500 MG capsule Take 4 capsules (2,000 mg total) by mouth once as needed for up to 1 dose. 60 mins prior to dental procedure (Patient not taking: Reported on  06/23/2020) 4 capsule 0  . apixaban (ELIQUIS) 2.5 MG TABS tablet Take 1 tablet (2.5 mg total) by mouth 2 (two) times daily. 60 tablet 5  . benzonatate (TESSALON) 100 MG capsule Take 1 capsule (100 mg total) by mouth 3 (three) times daily as needed for cough. 30 capsule 0  . Cholecalciferol (VITAMIN D) 50 MCG (2000 UT) tablet Take 2,000 Units by mouth 2 (two) times daily.    . clonazepam (KLONOPIN) 0.125 MG disintegrating tablet Take 1 tablet (0.125 mg total) by mouth 2 (two) times daily. 60 tablet 0  . Cyanocobalamin (VITAMIN B-12 PO) Take 3,000 mcg by mouth daily.     Marland Kitchen levothyroxine (SYNTHROID) 25 MCG tablet Take 25 mcg by mouth daily before breakfast.     . lidocaine-prilocaine (EMLA) cream Apply to affected area once 30 g 3  . LORazepam (ATIVAN) 0.5 MG tablet TAKE 1/2 TO 1 (ONE-HALF TO ONE) TABLET BY MOUTH EVERY 8 HOURS 30 tablet 0  . Multiple Vitamins-Minerals (PRESERVISION AREDS PO) Take 1 capsule by mouth 2 (two) times daily.     . ondansetron  (ZOFRAN) 8 MG tablet Take 1 tablet (8 mg total) by mouth 2 (two) times daily as needed. Start on the third day after chemotherapy. 30 tablet 1  . polyethylene glycol (MIRALAX / GLYCOLAX) 17 g packet Take 17 g by mouth daily.    . prochlorperazine (COMPAZINE) 10 MG tablet Take 1 tablet (10 mg total) by mouth every 6 (six) hours as needed (Nausea or vomiting). (Patient not taking: Reported on 06/23/2020) 30 tablet 1  . sodium chloride (MURO 128) 5 % ophthalmic ointment Place 1 application into both eyes at bedtime.    Marland Kitchen XIIDRA 5 % SOLN Place 1 drop into both eyes daily as needed (dry eyes).     Marland Kitchen zolpidem (AMBIEN) 5 MG tablet Take 5 mg by mouth at bedtime as needed for sleep.      No current facility-administered medications for this visit.     REVIEW OF SYSTEMS:   10 Point review of Systems was done is negative except as noted above.  PHYSICAL EXAMINATION: ECOG FS:1 - Symptomatic but completely ambulatory  There were no vitals filed for this visit. Wt Readings from Last 3 Encounters:  06/23/20 112 lb (50.8 kg)  06/01/20 114 lb 9.6 oz (52 kg)  05/11/20 113 lb 14.4 oz (51.7 kg)   There is no height or weight on file to calculate BMI.    NAD GENERAL:alert, in no acute distress and comfortable SKIN: no acute rashes, no significant lesions EYES: conjunctiva are pink and non-injected, sclera anicteric OROPHARYNX: MMM, no exudates, no oropharyngeal erythema or ulceration NECK: supple, no JVD LYMPH:  no palpable lymphadenopathy in the cervical, axillary or inguinal regions LUNGS: clear to auscultation b/l with normal respiratory effort HEART: regular rate & rhythm ABDOMEN:  normoactive bowel sounds , non tender, not distended. Extremity: no pedal edema PSYCH: alert & oriented x 3 with fluent speech NEURO: no focal motor/sensory deficits   LABORATORY DATA:  I have reviewed the data as listed  CBC Latest Ref Rng & Units 06/23/2020 06/01/2020 05/11/2020  WBC 4.0 - 10.5 K/uL 3.6(L) 4.1 5.2   Hemoglobin 12.0 - 15.0 g/dL 13.5 13.3 13.7  Hematocrit 36.0 - 46.0 % 41.5 40.4 41.0  Platelets 150 - 400 K/uL 196 186 214    CMP Latest Ref Rng & Units 06/23/2020 06/01/2020 05/11/2020  Glucose 70 - 99 mg/dL 93 121(H) 124(H)  BUN 8 - 23 mg/dL 24(H)  26(H) 31(H)  Creatinine 0.44 - 1.00 mg/dL 0.71 0.74 0.82  Sodium 135 - 145 mmol/L 142 143 143  Potassium 3.5 - 5.1 mmol/L 3.8 4.0 4.0  Chloride 98 - 111 mmol/L 106 105 105  CO2 22 - 32 mmol/L 27 28 29   Calcium 8.9 - 10.3 mg/dL 9.4 9.6 9.9  Total Protein 6.5 - 8.1 g/dL 6.8 6.8 7.1  Total Bilirubin 0.3 - 1.2 mg/dL 0.6 0.5 0.6  Alkaline Phos 38 - 126 U/L 73 81 101  AST 15 - 41 U/L 18 16 18   ALT 0 - 44 U/L 13 12 15    . Lab Results  Component Value Date   LDH 262 (H) 06/23/2020    Component     Latest Ref Rng & Units 03/27/2019  Hepatitis B Surface Ag     NON REACTIVE NON REACTIVE  Hep B Core Total Ab     NON REACTIVE NON REACTIVE  HCV Ab     NON REACTIVE NON REACTIVE  LDH     98 - 192 U/L 252 (H)   . Lab Results  Component Value Date   LDH 262 (H) 06/23/2020      04/08/2019 NM PET Image Initial (PI) Skull Base To Thigh (Accession [id number])  04/04/2019 PATHOLOGY   04/07/2019 ECHOCARDIOGRAM  03/28/2019 PATHOLOGY   03/06/2019 CT ANGIOGRAPHY CHEST WITH CONTRAST (Accession [id number])  04/07/2019 ECHOCARDIOGRAM  03/06/2019 ECHOCARDIOGRAM     03/06/2019 PORTABLE CHEST 1 VIEW (Accession [id number])    02/27/2019 CT CHEST, ABDOMEN, AND PELVIS WITH CONTRAST (Accession [id number])    02/17/2019 CHEST XRAY - 2 VIEW (Accession [id number])    RADIOGRAPHIC STUDIES: I have personally reviewed the radiological images as listed and agreed with the findings in the report. No results found.   ASSESSMENT & PLAN:   Sarah [name] is a 87 y.o. female with:  1. 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 CD30+ -03/17/2019 Surgical pathology revealed "LYMPH NODE, LEFT NECK,  EXCISION: - Angioimmunoblastic T-cell lymphoma. SKIN, LEFT THIGH, BIOPSY: - Involvement by angioimmunoblastic T-cell lymphoma." -04/07/2019 Echocardiogram - normal EF -04/08/2019 NM PET Image Initial (PI) Skull Base To Thigh (Accession [id number]) revealed "1. Widespread hypermetabolic adenopathy in the neck, chest, and abdomen/pelvis, primarily in the Deauville 4 and Deauville 5 range. There is also hypermetabolic activity along the posterior nasopharynx in lingual tonsillar regions potentially indicating sites of involvement. 2. The subtle hypodensities in the spine and bony pelvis are not appreciably hypermetabolic may be incidental, surveillance is suggested. 3. Bilateral thyroid activity but especially on the left. Probably from thyroiditis. 4. A 6 mm subpleural nodule anteriorly in the right lower lobe not appreciably hypermetabolic but is below sensitive PET-CT size thresholds and merits surveillance. 5. Mildly accentuated diffuse splenic activity without splenomegaly or focal splenic lesion identified. 6. Other imaging findings of potential clinical significance: Aortic Atherosclerosis (ICD10-I70.0). Coronary atherosclerosis. Large right and small left pleural effusions. Moderate cardiomegaly. Small pericardial effusion. Mild mesenteric edema. Large left renal cyst. Fullness of both renal collecting systems with suspected nonobstructive left nephrolithiasis. Degenerative grade 1 anterolisthesis at L4-5 at L5-S1. Trace pelvic ascites." -05/28/2019 PET/CT ([id number]) revealed "Complete metabolic response to therapy". -09/16/2019 PET/CT ([id number]) revealed "1. Unfortunately evidence of lymphoma recurrence at sites of previous hypermetabolic adenopathy. Hypermetabolic lymphoid tissue is new from comparison PET-CT 05/28/2019 but at same locations as PET-CT 04/08/2019. The number of metastatic lymph nodes is less than 04/08/2019. Lymphoid tissue at the RIGHT skull base is larger. 2. Three foci of  hypermetabolic recurrence are  present, lymphoid tissue at the RIGHT base of skull, hypermetabolic RIGHT hilar lymph node and hypermetabolic lymph node in the porta hepatis." -11/25/2019 PET/CT ([id number]) revealed "1. Marked interval improvement in the hypermetabolic disease identified previously in the posterior right nasopharynx, compatible with Deauville 4 today. 2. Decreased hypermetabolism in the right hilar and porta hepatis lymph nodes identified previously (Deauville 3). No new sites of hypermetabolic disease in the chest, abdomen, or pelvis. 3. Similar diffuse thyroid uptake, left greater than right. 4. No substantial change in right-sided pulmonary nodules." -04/09/2020 PET/CT ([id number]) revealed "No evidence progressive lymphoma."  2. Pulmonary Embolism -extensive .likely related to extensive malignancy  3. H/o Breast Cancer  -The patient had bilateral diagnostic  [facility] 07[facility]11/2011. This  showed some calcifications in the right  breast, which seemed a little bit more  prominent than prior. In the left breast  there was an area of possible  architectural distortion. However left  breast ultrasound the same day showed  no abnormality. 6 month followup was  suggested, and on 02/28/2012 the  patient again had bilateral diagnostic  mammography, now with right  ultrasonography. The microcalcifications  in the right breast appeared increased.  Ultrasound showed a hypoechoic lesion  measuring 5 mm, with no associated  shadowing. This had been previously  noted and appeared unchanged. Anterior  to that there was a small hypoechoic  mass measuring 4 mm in diameter.  There was felt to be suspicious, and on  03/14/2012 the patient underwent biopsy  of the right breast mass, showing (SAA  [id number]) ductal carcinoma in situ,  intermediate grade, estrogen receptor  100% and progesterone receptor 100%  positive.   -Bilateral breast MRI was obtained  03/26/2012. This showed only post  biopsy  changes in the right breast,  associated with an area of non-masslike  enhancement measuring 2.4 cm. The  left breast was unremarkable, and there  was no enlarged axillary or internal  mammary adenopathy noted.  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 The patient's  subsequent history is as detailed below.   PLAN: -Discussed pt labwork today, 07/13/2020; - stablre. LDH 275 but stable -No lab or clinical evidence of Angioimmunoblastic T-cell lymphoma progression at this time. -labs stable -The pt has no prohibitive toxicities from continuing next cycle of  Brentuximab-Vedotin at this time. -chemotherapy orders reviewed and signed. -Continue 2.5 Eliquis BID    FOLLOW-UP: F/u as per currently scheduled appointments on 3/15 for her next cycle of treatment with labs and MD visit.    The total time spent in the appointment was 30 minutes and more than 50% was on counseling and direct patient cares.   All of the patient's questions were answered with apparent satisfaction. The patient knows to call the clinic with any problems, questions or concerns.    Sullivan Lone MD [place] [facility]IVMS [facility] Hematology/Oncology Physician [facility]  (Office):       [phone] (Work cell):  [phone] (Fax):           [phone]  07/11/2020 10:25 AM   I, Reinaldo Raddle, am acting as scribe for Dr. Sullivan Lone, MD.     .I have reviewed the above documentation for accuracy and completeness, and I agree with the above. Brunetta Genera MD

## 2020-07-13 ENCOUNTER — Inpatient Hospital Stay: Payer: Medicare Other

## 2020-07-13 ENCOUNTER — Other Ambulatory Visit: Payer: Self-pay

## 2020-07-13 ENCOUNTER — Inpatient Hospital Stay (HOSPITAL_BASED_OUTPATIENT_CLINIC_OR_DEPARTMENT_OTHER): Payer: Medicare Other | Admitting: Hematology

## 2020-07-13 VITALS — BP 143/63 | HR 77 | Temp 97.8°F | Resp 17 | Ht 66.0 in | Wt 112.0 lb

## 2020-07-13 DIAGNOSIS — C8440 Peripheral T-cell lymphoma, not classified, unspecified site: Secondary | ICD-10-CM

## 2020-07-13 DIAGNOSIS — Z5111 Encounter for antineoplastic chemotherapy: Secondary | ICD-10-CM

## 2020-07-13 DIAGNOSIS — Z7189 Other specified counseling: Secondary | ICD-10-CM

## 2020-07-13 DIAGNOSIS — C865 Angioimmunoblastic T-cell lymphoma: Secondary | ICD-10-CM | POA: Diagnosis not present

## 2020-07-13 DIAGNOSIS — Z5112 Encounter for antineoplastic immunotherapy: Secondary | ICD-10-CM | POA: Diagnosis not present

## 2020-07-13 DIAGNOSIS — Z95828 Presence of other vascular implants and grafts: Secondary | ICD-10-CM

## 2020-07-13 LAB — CMP (CANCER CENTER ONLY)
ALT: 12 U/L (ref 0–44)
AST: 18 U/L (ref 15–41)
Albumin: 4.2 g/dL (ref 3.5–5.0)
Alkaline Phosphatase: 74 U/L (ref 38–126)
Anion gap: 8 (ref 5–15)
BUN: 30 mg/dL — ABNORMAL HIGH (ref 8–23)
CO2: 28 mmol/L (ref 22–32)
Calcium: 9.4 mg/dL (ref 8.9–10.3)
Chloride: 105 mmol/L (ref 98–111)
Creatinine: 0.75 mg/dL (ref 0.44–1.00)
GFR, Estimated: >60 mL/min (ref >60)
Glucose, Bld: 98 mg/dL (ref 70–99)
Potassium: 4.2 mmol/L (ref 3.5–5.1)
Sodium: 141 mmol/L (ref 135–145)
Total Bilirubin: 0.4 mg/dL (ref 0.3–1.2)
Total Protein: 6.8 g/dL (ref 6.5–8.1)

## 2020-07-13 LAB — CBC WITH DIFFERENTIAL/PLATELET
Abs Immature Granulocytes: 0.01 10*3/uL (ref 0.00–0.07)
Basophils Absolute: 0 10*3/uL (ref 0.0–0.1)
Basophils Relative: 1 %
Eosinophils Absolute: 0 10*3/uL (ref 0.0–0.5)
Eosinophils Relative: 1 %
HCT: 40 % (ref 36.0–46.0)
Hemoglobin: 13.3 g/dL (ref 12.0–15.0)
Immature Granulocytes: 0 %
Lymphocytes Relative: 16 %
Lymphs Abs: 0.7 10*3/uL (ref 0.7–4.0)
MCH: 30 pg (ref 26.0–34.0)
MCHC: 33.3 g/dL (ref 30.0–36.0)
MCV: 90.1 fL (ref 80.0–100.0)
Monocytes Absolute: 0.5 10*3/uL (ref 0.1–1.0)
Monocytes Relative: 12 %
Neutro Abs: 3.2 10*3/uL (ref 1.7–7.7)
Neutrophils Relative %: 70 %
Platelets: 204 10*3/uL (ref 150–400)
RBC: 4.44 MIL/uL (ref 3.87–5.11)
RDW: 14.8 % (ref 11.5–15.5)
WBC: 4.5 10*3/uL (ref 4.0–10.5)
nRBC: 0 % (ref 0.0–0.2)

## 2020-07-13 LAB — LACTATE DEHYDROGENASE: LDH: 275 U/L — ABNORMAL HIGH (ref 98–192)

## 2020-07-13 MED ORDER — DIPHENHYDRAMINE HCL 50 MG/ML IJ SOLN
INTRAMUSCULAR | Status: AC
  Filled 2020-07-13: qty 1

## 2020-07-13 MED ORDER — DIPHENHYDRAMINE HCL 50 MG/ML IJ SOLN
50.0000 mg | Freq: Once | INTRAMUSCULAR | Status: AC
  Administered 2020-07-13: 50 mg via INTRAVENOUS

## 2020-07-13 MED ORDER — HEPARIN SOD (PORK) LOCK FLUSH 100 UNIT/ML IV SOLN
500.0000 [IU] | Freq: Once | INTRAVENOUS | Status: AC | PRN
  Administered 2020-07-13: 500 [IU]
  Filled 2020-07-13: qty 5

## 2020-07-13 MED ORDER — FAMOTIDINE IN NACL 20-0.9 MG/50ML-% IV SOLN
INTRAVENOUS | Status: AC
  Filled 2020-07-13: qty 50

## 2020-07-13 MED ORDER — FAMOTIDINE IN NACL 20-0.9 MG/50ML-% IV SOLN
20.0000 mg | Freq: Once | INTRAVENOUS | Status: AC
  Administered 2020-07-13: 20 mg via INTRAVENOUS

## 2020-07-13 MED ORDER — DEXAMETHASONE SODIUM PHOSPHATE 10 MG/ML IJ SOLN
INTRAMUSCULAR | Status: AC
  Filled 2020-07-13: qty 1

## 2020-07-13 MED ORDER — ACETAMINOPHEN 325 MG PO TABS
ORAL_TABLET | ORAL | Status: AC
  Filled 2020-07-13: qty 2

## 2020-07-13 MED ORDER — SODIUM CHLORIDE 0.9% FLUSH
10.0000 mL | INTRAVENOUS | Status: DC | PRN
  Administered 2020-07-13: 10 mL
  Filled 2020-07-13: qty 10

## 2020-07-13 MED ORDER — SODIUM CHLORIDE 0.9% FLUSH
10.0000 mL | Freq: Once | INTRAVENOUS | Status: AC
  Administered 2020-07-13: 10 mL
  Filled 2020-07-13: qty 10

## 2020-07-13 MED ORDER — ACETAMINOPHEN 325 MG PO TABS
650.0000 mg | ORAL_TABLET | Freq: Once | ORAL | Status: AC
  Administered 2020-07-13: 650 mg via ORAL

## 2020-07-13 MED ORDER — DEXAMETHASONE SODIUM PHOSPHATE 10 MG/ML IJ SOLN
5.0000 mg | Freq: Once | INTRAMUSCULAR | Status: AC
  Administered 2020-07-13: 5 mg via INTRAVENOUS

## 2020-07-13 MED ORDER — SODIUM CHLORIDE 0.9 % IV SOLN
1.8000 mg/kg | Freq: Once | INTRAVENOUS | Status: AC
  Administered 2020-07-13: 100 mg via INTRAVENOUS
  Filled 2020-07-13: qty 20

## 2020-07-13 MED ORDER — SODIUM CHLORIDE 0.9 % IV SOLN
Freq: Once | INTRAVENOUS | Status: AC
  Filled 2020-07-13: qty 250

## 2020-07-13 MED ORDER — ONDANSETRON HCL 4 MG/2ML IJ SOLN
INTRAMUSCULAR | Status: AC
  Filled 2020-07-13: qty 2

## 2020-07-13 MED ORDER — ONDANSETRON HCL 4 MG/2ML IJ SOLN
4.0000 mg | Freq: Once | INTRAMUSCULAR | Status: AC
  Administered 2020-07-13: 4 mg via INTRAVENOUS

## 2020-07-13 NOTE — Patient Instructions (Signed)
[place] Discharge Instructions for Patients Receiving Chemotherapy  Today you received the following chemotherapy agents Adcentris  To help prevent nausea and vomiting after your treatment, we encourage you to take your nausea medication as directed.    If you develop nausea and vomiting that is not controlled by your nausea medication, call the clinic.   BELOW ARE SYMPTOMS THAT SHOULD BE REPORTED IMMEDIATELY:  *FEVER GREATER THAN 100.5 F  *CHILLS WITH OR WITHOUT FEVER  NAUSEA AND VOMITING THAT IS NOT CONTROLLED WITH YOUR NAUSEA MEDICATION  *UNUSUAL SHORTNESS OF BREATH  *UNUSUAL BRUISING OR BLEEDING  TENDERNESS IN MOUTH AND THROAT WITH OR WITHOUT PRESENCE OF ULCERS  *URINARY PROBLEMS  *BOWEL PROBLEMS  UNUSUAL RASH Items with * indicate a potential emergency and should be followed up as soon as possible.  Feel free to call the clinic should you have any questions or concerns. The clinic phone number is [phone].  Please show the [place] [facility] at check-in to the Emergency Department and triage nurse.

## 2020-07-20 ENCOUNTER — Other Ambulatory Visit: Payer: Self-pay | Admitting: Hematology

## 2020-07-21 NOTE — Telephone Encounter (Signed)
Please review for refill thank you. 

## 2020-08-02 NOTE — Progress Notes (Signed)
HEMATOLOGY/ONCOLOGY CLINIC NOTE  Date of Service: 08/03/2020  Patient Care Team: Sarah Manes, MD as PCP - General (Internal Medicine) Sarah Dresser, MD as PCP - Cardiology (Cardiology)  REFERRING PHYSICIAN: Lajean Manes, MD  CHIEF COMPLAINTS/PURPOSE OF CONSULTATION:  Continue mx of Recently Diagnosed Angioimmunoblastic T cell lymphoma      HISTORY OF PRESENTING ILLNESS:   Sarah [name] is a wonderful 87 y.o. female who has been referred to [place] by Sarah Manes, MD for evaluation and management of Possible lymphoma . She is accompanied today by her daughter Sarah [name] . The pt reports that she is doing well overall.   Pending Surgical pathology from 03/17/2019 Sarah [name] will contact clinic about pathology   August 25th/27th she began coughing and sneezing. On Sept 3 went to doctors and they said it was allergies. She began having rashes (little red dots). Went to PCP and they prescribed prednisone and for several days and the medication helped. But as soon as she completed the prescription symptoms reoccurred.   She had a chest X Ray done because she was having a severe cough on 02/17/2019 revealing "bilateral effusions in this patient with history of breast cancer,of uncertain significance with question of right lower lobe pulmonary nodule, consider chest CT for further assessment. Atherosclerotic changes in the thoracic aorta."  Sarah [name] biopsied the rashes on her back 02/21/2019 and it was determined that it could be because of an allergy to medication. Sarah [name] stopped her blood pressure medication from September- October but ther rash still present. The itching started 1 to 2 months ago. The rash would come and go and was mostly on her back. She still has rash on her left thigh.  She is using triaconazole and cetrizine.  She has lumps behind ear and abdomin and she states that they have swollen twice the size within 1 and half months  She has no appetite and  has changes in her taste that began around August. She has changes in bowels. They are more loose and smaller In size. She also has no control over bowels.  She feels fatigued and it has increased since August.   She has a cough that starts in September  that has made her hoarse.   She had a CT scan on 02/27/2019 because she was having severe cough and her stomach was very extended. Revealing "1. Extensive lymphadenopathy throughout the chest, abdomen, and pelvis, with index lymph nodes identified above. Splenomegaly. Findings are most consistent with lymphoma with recurrent, metastatic breast malignancy less favored. 2. There is generally osteopenic appearance of the skeleton. There are numerous subtle hypodense lesions, particularly of the pelvis (e.g. Series 2, image 88) and select vertebral bodies, for example L4 (series 8, image 92). This is suspicious although not definitive for osseous metastatic disease. Characterization for metabolic activity by nuclear scintigraphic bone scan or PET-CT would be helpful to further evaluate. 3. Moderate right, small left pleural effusions with associated atelectasis or consolidation. Subpleural radiation fibrosis of the anterior right lung. 4. There is a new subpleural pulmonary nodule of the anterior right middle lobe measuring 6 mm (series 4, image 105), nonspecific. There is no obvious pleural thickening or nodularity definitive for metastatic disease. 5.  Small volume ascites. 6. There is a fluid attenuation lesion of the pancreatic uncinate measuring 2.4 x 1.3 cm (series 2, image 63), not substantially changed when compared to remote prior MR examination dated 02/23/2012 and likely sequelae of a prior pseudocyst versus incidental pancreatic IPMN. 7.  Cardiomegaly."  Hospitalized from 03/06/2019-03/08/2019. She presenting with acute shortness of breath, leg swelling,left hand swelling and some weight gain despite poor appetite. In ED, CTA chest revealed at least  submassive PE, moderate right pleural effusion, diffuse LAD concerning for lymphoma. Started on heparin drip. PCCM consulted for guidance. She had thoracocentesis with removal of 1200 cc cloudy exudative pleural fluid. Cardiology consulted for elevated which was thought to be demand ischemia from right heart strain in the setting of PE. Patient was transitioned to subcu Lovenox by pulmonology the next day and remained stable. Unusual appearance of tracheal air column at the thoracic inlet. Correlate with any history of swallowing difficulty or changes in voice with dedicated imaging with chest CT and or CT of the neck as indicated.  She has a nonhealing ulcer on her right tibialis anterior that starting weeping one week ago. Of note since the patient's last visit, pt has had CHEST XRAY - 2 VIEW (Accession [id number]) completed on 02/17/2019 with results revealing "Bilateral effusions in this patient with history of breast cancer,of uncertain significance with question of right lower lobepulmonary nodule, consider chest CT for furtherassessment.Unusual appearance of tracheal air column at the thoracic inlet.Correlate with any history of swallowing difficulty or changes in voice with dedicated imaging with chest CT and or CT of the neck as Indicated. Atherosclerotic changes in the thoracic aorta."  Of note since the patient's last visit, pt has had CT CHEST, ABDOMEN, AND PELVIS WITH CONTRAST (Accession [id number]) completed on 02/27/2019 with results revealing "1. Extensive lymphadenopathy throughout the chest, abdomen, and pelvis, with index lymph nodes identified above. Splenomegaly.Findings are most consistent with lymphoma with recurrent, metastatic breast malignancy less favored. 2. There is generally osteopenic appearance of the skeleton. There are numerous subtle hypodense lesions, particularly of the pelvis (e.g. Series 2, image 88) and select vertebral bodies, for example L4 (series 8, image 92).  This is suspicious although not definitive for osseous metastatic disease. Characterization for metabolic activity by nuclear scintigraphic bone scan or PET-CT would be helpful to further evaluate.  3. Moderate right, small left pleural effusions with associated atelectasis or consolidation. Subpleural radiation fibrosis of the anterior right lung.4. There is a new subpleural pulmonary nodule of the anterior right middle lobe measuring 6 mm (series 4, image 105), nonspecific. There is no obvious pleural thickening or nodularity definitive for metastatic disease.5.  Small volume ascites. 6. There is a fluid attenuation lesion of the pancreatic uncinate measuring 2.4 x 1.3 cm (series 2, image 63), not substantially changed when compared to remote prior MR examination dated 02/23/2012 and likely sequelae of a prior pseudocyst versus incidental pancreatic IPMN.7.  Cardiomegaly.8. Other chronic and incidental findings as detailed above. Aortic Atherosclerosis (ICD10-I70.0)."  Of note since the patient's last visit, pt has had PORTABLE CHEST 1 VIEW (Accession [id number]) completed on 03/06/2019 with results revealing "1. Moderate size right and small left pleural effusions appear not significantly changed since 02/27/19. 2. No new cardiopulmonary abnormality."  Of note since the patient's last visit, pt has had ECHOCARDIOGRAM  completed on 03/06/2019 with results revealing  "1. Left ventricular ejection fraction, by visual estimation, is 60 to 65%. The left ventricle has normal function. Normal left ventricular size. There is no left ventricular hypertrophy. 2. Left ventricular diastolic Doppler parameters are consistent with impaired relaxation pattern of LV diastolic filling. 3. Global right ventricle has mildly reduced systolic function.The right ventricular size is moderately enlarged. No increase in right ventricular wall thickness. 4. Left atrial size was normal.  5. Right atrial size was mildly dilated.  6. Trivial pericardial effusion is present. 7. The mitral valve is normal in structure. Trace mitral valve regurgitation. No evidence of mitral stenosis. 8. The tricuspid valve is normal in structure. Tricuspid valve regurgitation severe. 9. The aortic valve is normal in structure. Aortic valve regurgitation was not visualized by color flow Doppler. Structurally normal aortic valve, with no evidence of sclerosis or stenosis.10. The pulmonic valve was normal in structure. Pulmonic valve regurgitation is mild by color flow Doppler.11. Mildly elevated pulmonary artery systolic pressure.12. The inferior vena cava is normal in size with greater than 50% respiratory variability, suggesting right atrial pressure of 3 mmHg."  Of note since the patient's last visit, pt has had CT ANGIOGRAPHY CHEST WITH CONTRAST (Accession [id number]) completed on 03/06/2019 with results revealing "1. Pulmonary embolus arising from the distal left main pulmonary artery with extension into multiple left lower lobe pulmonary arterial branches. There is also incompletely obstructing pulmonary embolus in the proximal left upper lobe pulmonary artery. More distal pulmonary embolus in the right lower lobe pulmonary artery. Positive for acute PE with CT evidence of right heart strain (RV/LV Ratio = 1.6) consistent with at least submassive (intermediate risk) PE. The presence of right heart strain has been associated with an increased risk of morbidity and mortality. Please activate Code PE by paging [phone]. 2. Sizable pleural effusion on the right with smaller pleural effusion on the left. Consolidation and compressive atelectasis throughout most of the right lower lobe. Milder atelectasis left base. Nodular opacities on the right, likely metastatic foci.3.  Stable adenopathy compared to 1 week prior.4. Enlargement of spleen, incompletely visualized but documented CT 1 week prior. Concern for potential lymphoma. 5. Aortic atherosclerosis.  No aneurysm or dissection evident. Foci of coronary artery calcification noted."  Most recent lab results (03/06/2019) of CBC is as follows: all values are WNL except for Platelets at 72, Eosinophils Absolute at 0.6,Abs Immature Granulocytes at 0.08 .  On review of systems, pt reports rashes, digestive issues and denies back pain, abdominal pain and any other symptoms.   On PMHx the pt reports Hypothyroidism, MCI, Hypercholesterolemia, Tracheal anomaly, Hypertension, Pulmonary nodule, Atherosclerosis of aorta, anxiety, gait disorder, primary insomnia.   INTERVAL HISTORY:  Sarah [name] is a 87 y.o. female here for evaluation and management of Angioimmunoblastic T-cell lymphoma. The patient's last visit with [place] was on 07/13/2020. The pt reports that she is doing well overall. We are joined today by her daughter.  The pt reports that her hip has been bothering her, but this is not new. This is due to a fall from her cat running into her several years ago. The pt notes that her feet feel very smooth and like they are leather intermittently at night when she is changing clothes. The pt has also been experiencing restless legs during the treatment, but not persisting after. The pt has lost a few pounds recently, but has not had a decreased appetite.  Lab results today 08/03/2020 of CBC w/diff and CMP is as follows: all values are WNL except for BUN of 25. 08/03/2020 LDH of 260.  On review of systems, pt reports anxiety, restless legs during treatment and denies acute skin rashes, fever, chills, night sweats, tingling/numbness in hands/feet, leg swelling, itching, abdominal pain, and any other symptoms.  MEDICAL HISTORY:  Past Medical History:  Diagnosis Date  . Allergy    codeine, thiazides  . Anxiety    new dx  . Arthritis   .  Breast cancer ([place]) 03/14/12   bx=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,ER/PR=+,upper inner quad  . Cancer ([place])    breast  . Cataract   . DVT  (deep venous thrombosis) ([place]) 02/2019   left leg  . Dyspnea   . Edema    both legs feet and toe, abdomen  . Glaucoma    laser treated years ago  . HOH (hard of hearing)   . Hyperlipemia   . Hypertension   . Hypothyroidism   . Pancreatic cyst    benign  . PONV (postoperative nausea and vomiting)   . Pulmonary embolism ([place]) 02/2019   bilateral   . Radiation 06/11/2012-07/12/2012   17 sessions 4250 cGy, 3 sessions 750 cGy  . Vertigo   . Wears glasses   . Wears partial dentures    partial upper     SURGICAL HISTORY: Past Surgical History:  Procedure Laterality Date  . ABDOMINAL HYSTERECTOMY  1966   1/2 ovary left in   . BREAST SURGERY  1992   lumpectomy-lt  . CATARACT EXTRACTION     b/l  . COLONOSCOPY    . EXCISION MASS NECK Left 03/17/2019    EXCISION MASS NECK (Left Neck)  . EXCISION MASS NECK Left 03/17/2019   Procedure: EXCISION MASS NECK;  Surgeon: Helayne Seminole, MD;  Location: [facility];  Service: ENT;  Laterality: Left;  . EYE SURGERY Bilateral    bilateral cataract removal  . IR IMAGING GUIDED PORT INSERTION  04/23/2019  . JOINT REPLACEMENT  2013   rt total knee  . JOINT REPLACEMENT  1995   lt total knee  . PARTIAL MASTECTOMY WITH NEEDLE LOCALIZATION  04/09/2012   Procedure: PARTIAL MASTECTOMY WITH NEEDLE LOCALIZATION;  Surgeon: Adin Hector, MD;  Location: [facility];  Service: General;  Laterality: Right;  . SKIN BIOPSY Left 03/17/2019   LEFT THIGH  . SKIN BIOPSY Left 03/17/2019   Procedure: Skin Biopsy Left Thigh;  Surgeon: Helayne Seminole, MD;  Location: [facility];  Service: ENT;  Laterality: Left;  . TONSILLECTOMY    . TOTAL KNEE ARTHROPLASTY  08/16/2011   Procedure: TOTAL KNEE ARTHROPLASTY;  Surgeon: Ninetta Lights, MD;  Location: [facility];  Service: Orthopedics;  Laterality: Right;     SOCIAL HISTORY: Social History   Socioeconomic History  . Marital status: Widowed    Spouse name: Not on file  . Number of children: 2  .  Years of education: Not on file  . Highest education level: Not on file  Occupational History  . Occupation: Retired    Comment: Community education officer   Tobacco Use  . Smoking status: Never Smoker  . Smokeless tobacco: Never Used  Vaping Use  . Vaping Use: Never used  Substance and Sexual Activity  . Alcohol use: No  . Drug use: No  . Sexual activity: Not Currently  Other Topics Concern  . Not on file  Social History Narrative  . Not on file   Social Determinants of Health   Financial Resource Strain: Not on file  Food Insecurity: Not on file  Transportation Needs: Not on file  Physical Activity: Not on file  Stress: Not on file  Social Connections: Not on file  Intimate Partner Violence: Not on file     FAMILY HISTORY: Family History  Problem Relation Age of Onset  . Heart disease Father   . Cancer Maternal Aunt        stomach     ALLERGIES:  is allergic to citalopram, alendronate sodium, amoxicillin-pot clavulanate, codeine, latex, lisinopril, septra [sulfamethoxazole-trimethoprim], and thiazide-type diuretics.   MEDICATIONS:  Current Outpatient Medications  Medication Sig Dispense Refill  . amLODipine (NORVASC) 2.5 MG tablet Take 2.5-5 mg by mouth See admin instructions. 5 mg in the morning, 2.5 mg in the evening    . amoxicillin (AMOXIL) 500 MG capsule Take 4 capsules (2,000 mg total) by mouth once as needed for up to 1 dose. 60 mins prior to dental procedure (Patient not taking: Reported on 06/23/2020) 4 capsule 0  . apixaban (ELIQUIS) 2.5 MG TABS tablet Take 1 tablet (2.5 mg total) by mouth 2 (two) times daily. 60 tablet 5  . benzonatate (TESSALON) 100 MG capsule Take 1 capsule (100 mg total) by mouth 3 (three) times daily as needed for cough. 30 capsule 0  . Cholecalciferol (VITAMIN D) 50 MCG (2000 UT) tablet Take 2,000 Units by mouth 2 (two) times daily.    . clonazepam (KLONOPIN) 0.125 MG disintegrating tablet Take 1 tablet (0.125 mg total) by mouth 2 (two) times  daily. 60 tablet 0  . Cyanocobalamin (VITAMIN B-12 PO) Take 3,000 mcg by mouth daily.     Marland Kitchen levothyroxine (SYNTHROID) 25 MCG tablet Take 25 mcg by mouth daily before breakfast.     . lidocaine-prilocaine (EMLA) cream Apply to affected area once 30 g 3  . LORazepam (ATIVAN) 0.5 MG tablet TAKE 1/2 TO 1 (ONE-HALF TO ONE) TABLET BY MOUTH EVERY 8 HOURS 30 tablet 0  . Multiple Vitamins-Minerals (PRESERVISION AREDS PO) Take 1 capsule by mouth 2 (two) times daily.     . ondansetron (ZOFRAN) 8 MG tablet Take 1 tablet (8 mg total) by mouth 2 (two) times daily as needed. Start on the third day after chemotherapy. 30 tablet 1  . polyethylene glycol (MIRALAX / GLYCOLAX) 17 g packet Take 17 g by mouth daily.    . prochlorperazine (COMPAZINE) 10 MG tablet Take 1 tablet (10 mg total) by mouth every 6 (six) hours as needed (Nausea or vomiting). (Patient not taking: Reported on 06/23/2020) 30 tablet 1  . sodium chloride (MURO 128) 5 % ophthalmic ointment Place 1 application into both eyes at bedtime.    Marland Kitchen XIIDRA 5 % SOLN Place 1 drop into both eyes daily as needed (dry eyes).     Marland Kitchen zolpidem (AMBIEN) 5 MG tablet Take 5 mg by mouth at bedtime as needed for sleep.      No current facility-administered medications for this visit.     REVIEW OF SYSTEMS:   10 Point review of Systems was done is negative except as noted above.  PHYSICAL EXAMINATION: ECOG FS:1 - Symptomatic but completely ambulatory  Vitals:   08/03/20 1507  BP: 128/69  Pulse: 68  Resp: 18  Temp: (!) 97.3 F (36.3 C)  SpO2: 98%   Wt Readings from Last 3 Encounters:  08/03/20 110 lb 14.4 oz (50.3 kg)  07/13/20 112 lb (50.8 kg)  06/23/20 112 lb (50.8 kg)   Body mass index is 17.9 kg/m.    Exam was given in a chair.  GENERAL:alert, in no acute distress and comfortable SKIN: no acute rashes, no significant lesions EYES: conjunctiva are pink and non-injected, sclera anicteric OROPHARYNX: MMM, no exudates, no oropharyngeal erythema or  ulceration NECK: supple, no JVD LYMPH:  no palpable lymphadenopathy in the cervical, axillary or inguinal regions LUNGS: clear to auscultation b/l with normal respiratory effort HEART: regular rate & rhythm ABDOMEN:  normoactive bowel sounds , non tender,  not distended. Extremity: no pedal edema PSYCH: alert & oriented x 3 with fluent speech NEURO: no focal motor/sensory deficits   LABORATORY DATA:  I have reviewed the data as listed  CBC Latest Ref Rng & Units 08/03/2020 07/13/2020 06/23/2020  WBC 4.0 - 10.5 K/uL 4.2 4.5 3.6(L)  Hemoglobin 12.0 - 15.0 g/dL 13.0 13.3 13.5  Hematocrit 36.0 - 46.0 % 38.9 40.0 41.5  Platelets 150 - 400 K/uL 211 204 196    CMP Latest Ref Rng & Units 08/03/2020 07/13/2020 06/23/2020  Glucose 70 - 99 mg/dL 92 98 93  BUN 8 - 23 mg/dL 25(H) 30(H) 24(H)  Creatinine 0.44 - 1.00 mg/dL 0.80 0.75 0.71  Sodium 135 - 145 mmol/L 139 141 142  Potassium 3.5 - 5.1 mmol/L 4.2 4.2 3.8  Chloride 98 - 111 mmol/L 105 105 106  CO2 22 - 32 mmol/L 28 28 27   Calcium 8.9 - 10.3 mg/dL 9.8 9.4 9.4  Total Protein 6.5 - 8.1 g/dL 6.7 6.8 6.8  Total Bilirubin 0.3 - 1.2 mg/dL 0.5 0.4 0.6  Alkaline Phos 38 - 126 U/L 75 74 73  AST 15 - 41 U/L 16 18 18   ALT 0 - 44 U/L 12 12 13    . Lab Results  Component Value Date   LDH 260 (H) 08/03/2020    Component     Latest Ref Rng & Units 03/27/2019  Hepatitis B Surface Ag     NON REACTIVE NON REACTIVE  Hep B Core Total Ab     NON REACTIVE NON REACTIVE  HCV Ab     NON REACTIVE NON REACTIVE  LDH     98 - 192 U/L 252 (H)   . Lab Results  Component Value Date   LDH 260 (H) 08/03/2020      04/08/2019 NM PET Image Initial (PI) Skull Base To Thigh (Accession [id number])  04/04/2019 PATHOLOGY   04/07/2019 ECHOCARDIOGRAM  03/28/2019 PATHOLOGY   03/06/2019 CT ANGIOGRAPHY CHEST WITH CONTRAST (Accession [id number])  04/07/2019 ECHOCARDIOGRAM  03/06/2019 ECHOCARDIOGRAM     03/06/2019 PORTABLE CHEST 1 VIEW (Accession  [id number])    02/27/2019 CT CHEST, ABDOMEN, AND PELVIS WITH CONTRAST (Accession [id number])    02/17/2019 CHEST XRAY - 2 VIEW (Accession [id number])    RADIOGRAPHIC STUDIES: I have personally reviewed the radiological images as listed and agreed with the findings in the report. No results found.   ASSESSMENT & PLAN:   Sarah [name] is a 87 y.o. female with:  1. 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 SKIN, LEFT THIGH, BIOPSY: - Involvement by angioimmunoblastic T-cell lymphoma." -04/07/2019 Echocardiogram - normal EF -04/08/2019 NM PET Image Initial (PI) Skull Base To Thigh (Accession [id number]) revealed "1. Widespread hypermetabolic adenopathy in the neck, chest, and abdomen/pelvis, primarily in the Deauville 4 and Deauville 5 range. There is also hypermetabolic activity along the posterior nasopharynx in lingual tonsillar regions potentially indicating sites of involvement. 2. The subtle hypodensities in the spine and bony pelvis are not appreciably hypermetabolic may be incidental, surveillance is suggested. 3. Bilateral thyroid activity but especially on the left. Probably from thyroiditis. 4. A 6 mm subpleural nodule anteriorly in the right lower lobe not appreciably hypermetabolic but is below sensitive PET-CT size thresholds and merits surveillance. 5. Mildly accentuated diffuse splenic activity without splenomegaly or focal splenic lesion identified. 6. Other imaging findings of potential clinical significance: Aortic Atherosclerosis (ICD10-I70.0). Coronary atherosclerosis. Large right and  small left pleural effusions. Moderate cardiomegaly. Small pericardial effusion. Mild mesenteric edema. Large left renal cyst. Fullness of both renal collecting systems with suspected nonobstructive left nephrolithiasis. Degenerative  grade 1 anterolisthesis at L4-5 at L5-S1. Trace pelvic ascites." -05/28/2019 PET/CT ([id number]) revealed "Complete metabolic response to therapy". -09/16/2019 PET/CT ([id number]) revealed "1. Unfortunately evidence of lymphoma recurrence at sites of previous hypermetabolic adenopathy. Hypermetabolic lymphoid tissue is new from comparison PET-CT 05/28/2019 but at same locations as PET-CT 04/08/2019. The number of metastatic lymph nodes is less than 04/08/2019. Lymphoid tissue at the RIGHT skull base is larger. 2. Three foci of hypermetabolic recurrence are present, lymphoid tissue at the RIGHT base of skull, hypermetabolic RIGHT hilar lymph node and hypermetabolic lymph node in the porta hepatis." -11/25/2019 PET/CT ([id number]) revealed "1. Marked interval improvement in the hypermetabolic disease identified previously in the posterior right nasopharynx, compatible with Deauville 4 today. 2. Decreased hypermetabolism in the right hilar and porta hepatis lymph nodes identified previously (Deauville 3). No new sites of hypermetabolic disease in the chest, abdomen, or pelvis. 3. Similar diffuse thyroid uptake, left greater than right. 4. No substantial change in right-sided pulmonary nodules." -04/09/2020 PET/CT ([id number]) revealed "No evidence progressive lymphoma."  2. Pulmonary Embolism -extensive .likely related to extensive malignancy  3. H/o Breast Cancer  -The patient had bilateral diagnostic  mammography at [facility] 07/11/2011. This  showed some calcifications in the right  breast, which seemed a little bit more  prominent than prior. In the left breast  there was an area of possible  architectural distortion. However left  breast ultrasound the same day showed  no abnormality. 6 month followup was  suggested, and on 02/28/2012 the  patient again had bilateral diagnostic  mammography, now with right  ultrasonography. The microcalcifications  in the right breast appeared increased.  Ultrasound showed a  hypoechoic lesion  measuring 5 mm, with no associated  shadowing. This had been previously  noted and appeared unchanged. Anterior  to that there was a small hypoechoic  mass measuring 4 mm in diameter.  There was felt to be suspicious, and on  03/14/2012 the patient underwent biopsy  of the right breast mass, showing (SAA  [id number]) ductal carcinoma in situ,  intermediate grade, estrogen receptor  100% and progesterone receptor 100%  positive.   -Bilateral breast MRI was obtained  03/26/2012. This showed only post  biopsy changes in the right breast,  associated with an area of non-masslike  enhancement measuring 2.4 cm. The  left breast was unremarkable, and there  was no enlarged axillary or internal  mammary adenopathy noted.  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 The patient's  subsequent history is as detailed below.   PLAN: -Discussed pt labwork today, 08/03/2020; blood counts completely normal, blood chemistries normal, LDH pending. -Advised pt that steroids during treatment can lead to some elements of restless legs. -Discussed small fiber neuropathy as cause for restless legs. Can also be anxiety. Recommended pt use Ativan prn.  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 -Advised pt we will get repeat scans prior to next treatment. This would be 5 months since last scan. -The pt has no prohibitive toxicities from continuing next cycle of Brentuximab-Vedotin at this time. -Continue 2.5 Eliquis BID  -Will see back with next dose. -Rx Ativan 60 pills.  FOLLOW-UP: PET/CT in 2 weeks Plz schedule next 2 doses of Adcentris with labs and MD visits   The total time spent in the appointment was 20 minutes and more than 50% was on counseling and direct patient cares.   All  of the patient's questions were answered with apparent satisfaction. The patient knows to call the clinic with any problems, questions or concerns.    Sullivan Lone MD [place] AAHIVMS [facility] Hematology/Oncology Physician [facility]  (Office):       [phone] (Work cell):  [phone] (Fax):           [phone]  08/03/2020 3:54 PM   I, Reinaldo Raddle, am acting as scribe for Dr. Sullivan Lone, MD.     .I have reviewed the above documentation for accuracy and completeness, and I agree with the above. Brunetta Genera MD

## 2020-08-03 ENCOUNTER — Inpatient Hospital Stay: Payer: Medicare Other

## 2020-08-03 ENCOUNTER — Inpatient Hospital Stay: Payer: Medicare Other | Attending: Hematology | Admitting: Hematology

## 2020-08-03 ENCOUNTER — Other Ambulatory Visit: Payer: Self-pay

## 2020-08-03 VITALS — BP 128/69 | HR 68 | Temp 97.3°F | Resp 18 | Ht 66.0 in | Wt 110.9 lb

## 2020-08-03 DIAGNOSIS — C865 Angioimmunoblastic T-cell lymphoma: Secondary | ICD-10-CM | POA: Diagnosis not present

## 2020-08-03 DIAGNOSIS — Z853 Personal history of malignant neoplasm of breast: Secondary | ICD-10-CM | POA: Diagnosis not present

## 2020-08-03 DIAGNOSIS — Z7189 Other specified counseling: Secondary | ICD-10-CM

## 2020-08-03 DIAGNOSIS — C8440 Peripheral T-cell lymphoma, not classified, unspecified site: Secondary | ICD-10-CM | POA: Diagnosis not present

## 2020-08-03 DIAGNOSIS — Z7901 Long term (current) use of anticoagulants: Secondary | ICD-10-CM | POA: Diagnosis not present

## 2020-08-03 DIAGNOSIS — Z5112 Encounter for antineoplastic immunotherapy: Secondary | ICD-10-CM | POA: Insufficient documentation

## 2020-08-03 DIAGNOSIS — Z5111 Encounter for antineoplastic chemotherapy: Secondary | ICD-10-CM

## 2020-08-03 DIAGNOSIS — I2699 Other pulmonary embolism without acute cor pulmonale: Secondary | ICD-10-CM | POA: Diagnosis not present

## 2020-08-03 LAB — CBC WITH DIFFERENTIAL/PLATELET
Abs Immature Granulocytes: 0.01 10*3/uL (ref 0.00–0.07)
Basophils Absolute: 0 10*3/uL (ref 0.0–0.1)
Basophils Relative: 1 %
Eosinophils Absolute: 0 10*3/uL (ref 0.0–0.5)
Eosinophils Relative: 1 %
HCT: 38.9 % (ref 36.0–46.0)
Hemoglobin: 13 g/dL (ref 12.0–15.0)
Immature Granulocytes: 0 %
Lymphocytes Relative: 17 %
Lymphs Abs: 0.7 10*3/uL (ref 0.7–4.0)
MCH: 30 pg (ref 26.0–34.0)
MCHC: 33.4 g/dL (ref 30.0–36.0)
MCV: 89.8 fL (ref 80.0–100.0)
Monocytes Absolute: 0.5 10*3/uL (ref 0.1–1.0)
Monocytes Relative: 13 %
Neutro Abs: 2.9 10*3/uL (ref 1.7–7.7)
Neutrophils Relative %: 68 %
Platelets: 211 10*3/uL (ref 150–400)
RBC: 4.33 MIL/uL (ref 3.87–5.11)
RDW: 15 % (ref 11.5–15.5)
WBC: 4.2 10*3/uL (ref 4.0–10.5)
nRBC: 0 % (ref 0.0–0.2)

## 2020-08-03 LAB — CMP (CANCER CENTER ONLY)
ALT: 12 U/L (ref 0–44)
AST: 16 U/L (ref 15–41)
Albumin: 4.1 g/dL (ref 3.5–5.0)
Alkaline Phosphatase: 75 U/L (ref 38–126)
Anion gap: 6 (ref 5–15)
BUN: 25 mg/dL — ABNORMAL HIGH (ref 8–23)
CO2: 28 mmol/L (ref 22–32)
Calcium: 9.8 mg/dL (ref 8.9–10.3)
Chloride: 105 mmol/L (ref 98–111)
Creatinine: 0.8 mg/dL (ref 0.44–1.00)
GFR, Estimated: >60 mL/min (ref >60)
Glucose, Bld: 92 mg/dL (ref 70–99)
Potassium: 4.2 mmol/L (ref 3.5–5.1)
Sodium: 139 mmol/L (ref 135–145)
Total Bilirubin: 0.5 mg/dL (ref 0.3–1.2)
Total Protein: 6.7 g/dL (ref 6.5–8.1)

## 2020-08-03 LAB — LACTATE DEHYDROGENASE: LDH: 260 U/L — ABNORMAL HIGH (ref 98–192)

## 2020-08-03 MED ORDER — HEPARIN SOD (PORK) LOCK FLUSH 100 UNIT/ML IV SOLN
500.0000 [IU] | Freq: Once | INTRAVENOUS | Status: AC | PRN
  Administered 2020-08-03: 500 [IU]
  Filled 2020-08-03: qty 5

## 2020-08-03 MED ORDER — DIPHENHYDRAMINE HCL 50 MG/ML IJ SOLN
25.0000 mg | Freq: Once | INTRAMUSCULAR | Status: AC
  Administered 2020-08-03: 25 mg via INTRAVENOUS

## 2020-08-03 MED ORDER — FAMOTIDINE IN NACL 20-0.9 MG/50ML-% IV SOLN
20.0000 mg | Freq: Once | INTRAVENOUS | Status: AC
  Administered 2020-08-03: 20 mg via INTRAVENOUS

## 2020-08-03 MED ORDER — FAMOTIDINE IN NACL 20-0.9 MG/50ML-% IV SOLN
INTRAVENOUS | Status: AC
  Filled 2020-08-03: qty 50

## 2020-08-03 MED ORDER — SODIUM CHLORIDE 0.9% FLUSH
10.0000 mL | INTRAVENOUS | Status: DC | PRN
  Administered 2020-08-03: 10 mL
  Filled 2020-08-03: qty 10

## 2020-08-03 MED ORDER — ONDANSETRON HCL 4 MG/2ML IJ SOLN
4.0000 mg | Freq: Once | INTRAMUSCULAR | Status: AC
  Administered 2020-08-03: 4 mg via INTRAVENOUS

## 2020-08-03 MED ORDER — SODIUM CHLORIDE 0.9 % IV SOLN
Freq: Once | INTRAVENOUS | Status: AC
  Filled 2020-08-03: qty 250

## 2020-08-03 MED ORDER — ONDANSETRON HCL 4 MG/2ML IJ SOLN
INTRAMUSCULAR | Status: AC
  Filled 2020-08-03: qty 2

## 2020-08-03 MED ORDER — SODIUM CHLORIDE 0.9 % IV SOLN
1.8000 mg/kg | Freq: Once | INTRAVENOUS | Status: AC
  Administered 2020-08-03: 100 mg via INTRAVENOUS
  Filled 2020-08-03: qty 20

## 2020-08-03 MED ORDER — DEXAMETHASONE SODIUM PHOSPHATE 10 MG/ML IJ SOLN
INTRAMUSCULAR | Status: AC
  Filled 2020-08-03: qty 1

## 2020-08-03 MED ORDER — DIPHENHYDRAMINE HCL 50 MG/ML IJ SOLN: 50.0000 mg | Freq: Once | INTRAMUSCULAR | Status: DC

## 2020-08-03 MED ORDER — DEXAMETHASONE SODIUM PHOSPHATE 10 MG/ML IJ SOLN
5.0000 mg | Freq: Once | INTRAMUSCULAR | Status: AC
  Administered 2020-08-03: 5 mg via INTRAVENOUS

## 2020-08-03 MED ORDER — ACETAMINOPHEN 325 MG PO TABS
ORAL_TABLET | ORAL | Status: AC
  Filled 2020-08-03: qty 2

## 2020-08-03 MED ORDER — ACETAMINOPHEN 325 MG PO TABS
650.0000 mg | ORAL_TABLET | Freq: Once | ORAL | Status: AC
  Administered 2020-08-03: 650 mg via ORAL

## 2020-08-03 MED ORDER — DIPHENHYDRAMINE HCL 50 MG/ML IJ SOLN
INTRAMUSCULAR | Status: AC
  Filled 2020-08-03: qty 1

## 2020-08-03 NOTE — Patient Instructions (Signed)
[facility] Discharge Instructions for Patients Receiving Chemotherapy  Today you received the following chemotherapy agents Adcentris  To help prevent nausea and vomiting after your treatment, we encourage you to take your nausea medication as directed.    If you develop nausea and vomiting that is not controlled by your nausea medication, call the clinic.   BELOW ARE SYMPTOMS THAT SHOULD BE REPORTED IMMEDIATELY:  *FEVER GREATER THAN 100.5 F  *CHILLS WITH OR WITHOUT FEVER  NAUSEA AND VOMITING THAT IS NOT CONTROLLED WITH YOUR NAUSEA MEDICATION  *UNUSUAL SHORTNESS OF BREATH  *UNUSUAL BRUISING OR BLEEDING  TENDERNESS IN MOUTH AND THROAT WITH OR WITHOUT PRESENCE OF ULCERS  *URINARY PROBLEMS  *BOWEL PROBLEMS  UNUSUAL RASH Items with * indicate a potential emergency and should be followed up as soon as possible.  Feel free to call the clinic should you have any questions or concerns. The clinic phone number is [phone].  Please show the [place] at check-in to the Emergency Department and triage nurse.

## 2020-08-05 ENCOUNTER — Telehealth: Payer: Self-pay | Admitting: Hematology

## 2020-08-05 NOTE — Telephone Encounter (Signed)
Left message with follow-up appointment per 3/15 los. Gave option to call back to reschedule if needed.

## 2020-08-23 ENCOUNTER — Ambulatory Visit (HOSPITAL_COMMUNITY)
Admission: RE | Admit: 2020-08-23 | Discharge: 2020-08-23 | Disposition: A | Discharge status: Home / Self Care | Payer: Medicare Other | Source: Ambulatory Visit | Attending: Hematology | Admitting: Hematology

## 2020-08-23 ENCOUNTER — Other Ambulatory Visit: Payer: Self-pay

## 2020-08-23 DIAGNOSIS — M25551 Pain in right hip: Secondary | ICD-10-CM | POA: Diagnosis not present

## 2020-08-23 DIAGNOSIS — R918 Other nonspecific abnormal finding of lung field: Secondary | ICD-10-CM | POA: Diagnosis not present

## 2020-08-23 DIAGNOSIS — C8448 Peripheral T-cell lymphoma, not classified, lymph nodes of multiple sites: Secondary | ICD-10-CM | POA: Diagnosis not present

## 2020-08-23 DIAGNOSIS — C8440 Peripheral T-cell lymphoma, not classified, unspecified site: Secondary | ICD-10-CM

## 2020-08-23 DIAGNOSIS — Z5111 Encounter for antineoplastic chemotherapy: Secondary | ICD-10-CM

## 2020-08-23 LAB — GLUCOSE, CAPILLARY: Glucose-Capillary: 83 mg/dL (ref 70–99)

## 2020-08-23 MED ORDER — FLUDEOXYGLUCOSE F - 18 (FDG) INJECTION
10.0000 | Freq: Once | INTRAVENOUS | Status: AC | PRN
  Administered 2020-08-23: 5.5 via INTRAVENOUS

## 2020-08-23 NOTE — Progress Notes (Signed)
HEMATOLOGY/ONCOLOGY CLINIC NOTE  Date of Service: 08/23/2020  Patient Care Team: Lajean Manes, MD as PCP - General (Internal Medicine) Buford Dresser, MD as PCP - Cardiology (Cardiology)  REFERRING PHYSICIAN: Lajean Manes, MD  CHIEF COMPLAINTS/PURPOSE OF CONSULTATION:   Continue mx of Recently Diagnosed Angioimmunoblastic T cell lymphoma     HISTORY OF PRESENTING ILLNESS:   Sarah [name] is a wonderful 87 y.o. female who has been referred to [place] by Lajean Manes, MD for evaluation and management of Possible lymphoma . She is accompanied today by [name] . The pt reports that she is doing well overall.   Pending Surgical pathology from 03/17/2019 Dr.Manny will contact clinic about pathology   August 25th/27th she began coughing and sneezing. On Sept 3 went to doctors and they said it was allergies. She began having rashes (little red dots). Went to PCP and they prescribed prednisone and for several days and the medication helped. But as soon as she completed the prescription symptoms reoccurred.   She had a chest X Ray done because she was having a severe cough on 02/17/2019 revealing "bilateral effusions in this patient with history of breast cancer,of uncertain significance with question of right lower lobe pulmonary nodule, consider chest CT for further assessment. Atherosclerotic changes in the thoracic aorta."  Dr. Amy Martinique biopsied the rashes on her back 02/21/2019 and it was determined that it could be because of an allergy to medication. Dr.Stoneking stopped her blood pressure medication from September- October but ther rash still present. The itching started 1 to 2 months ago. The rash would come and go and was mostly on her back. She still has rash on her left thigh.  She is using triaconazole and cetrizine.  She has lumps behind ear and abdomin and she states that they have swollen twice the size within 1 and half months  She has no appetite and  has changes in her taste that began around August. She has changes in bowels. They are more loose and smaller In size. She also has no control over bowels.  She feels fatigued and it has increased since August.   She has a cough that starts in September  that has made her hoarse.   She had a CT scan on 02/27/2019 because she was having severe cough and her stomach was very extended. Revealing "1. Extensive lymphadenopathy throughout the chest, abdomen, and pelvis, with index lymph nodes identified above. Splenomegaly. Findings are most consistent with lymphoma with recurrent, metastatic breast malignancy less favored. 2. There is generally osteopenic appearance of the skeleton. There are numerous subtle hypodense lesions, particularly of the pelvis (e.g. Series 2, image 88) and select vertebral bodies, for example L4 (series 8, image 92). This is suspicious although not definitive for osseous metastatic disease. Characterization for metabolic activity by nuclear scintigraphic bone scan or PET-CT would be helpful to further evaluate. 3. Moderate right, small left pleural effusions with associated atelectasis or consolidation. Subpleural radiation fibrosis of the anterior right lung. 4. There is a new subpleural pulmonary nodule of the anterior right middle lobe measuring 6 mm (series 4, image 105), nonspecific. There is no obvious pleural thickening or nodularity definitive for metastatic disease. 5.  Small volume ascites. 6. There is a fluid attenuation lesion of the pancreatic uncinate measuring 2.4 x 1.3 cm (series 2, image 63), not substantially changed when compared to remote prior MR examination dated 02/23/2012 and likely sequelae of a prior pseudocyst versus incidental pancreatic IPMN. 7.  Cardiomegaly."  Hospitalized from 03/06/2019-03/08/2019. She presenting with acute shortness of breath, leg swelling,left hand swelling and some weight gain despite poor appetite. In ED, CTA chest revealed at least  submassive PE, moderate right pleural effusion, diffuse LAD concerning for lymphoma. Started on heparin drip. PCCM consulted for guidance. She had thoracocentesis with removal of 1200 cc cloudy exudative pleural fluid. Cardiology consulted for elevated which was thought to be demand ischemia from right heart strain in the setting of PE. Patient was transitioned to subcu Lovenox by pulmonology the next day and remained stable. Unusual appearance of tracheal air column at the thoracic inlet. Correlate with any history of swallowing difficulty or changes in voice with dedicated imaging with chest CT and or CT of the neck as indicated.  She has a nonhealing ulcer on her right tibialis anterior that starting weeping one week ago. Of note since the patient's last visit, pt has had CHEST XRAY - 2 VIEW (Accession [id number]) completed on 02/17/2019 with results revealing "Bilateral effusions in this patient with history of breast cancer,of uncertain significance with question of right lower lobepulmonary nodule, consider chest CT for furtherassessment.Unusual appearance of tracheal air column at the thoracic inlet.Correlate with any history of swallowing difficulty or changes in voice with dedicated imaging with chest CT and or CT of the neck as Indicated. Atherosclerotic changes in the thoracic aorta."  Of note since the patient's last visit, pt has had CT CHEST, ABDOMEN, AND PELVIS WITH CONTRAST (Accession [id number]) completed on 02/27/2019 with results revealing "1. Extensive lymphadenopathy throughout the chest, abdomen, and pelvis, with index lymph nodes identified above. Splenomegaly.Findings are most consistent with lymphoma with recurrent, metastatic breast malignancy less favored. 2. There is generally osteopenic appearance of the skeleton. There are numerous subtle hypodense lesions, particularly of the pelvis (e.g. Series 2, image 88) and select vertebral bodies, for example L4 (series 8, image 92).  This is suspicious although not definitive for osseous metastatic disease. Characterization for metabolic activity by nuclear scintigraphic bone scan or PET-CT would be helpful to further evaluate.  3. Moderate right, small left pleural effusions with associated atelectasis or consolidation. Subpleural radiation fibrosis of the anterior right lung.4. There is a new subpleural pulmonary nodule of the anterior right middle lobe measuring 6 mm (series 4, image 105), nonspecific. There is no obvious pleural thickening or nodularity definitive for metastatic disease.5.  Small volume ascites. 6. There is a fluid attenuation lesion of the pancreatic uncinate measuring 2.4 x 1.3 cm (series 2, image 63), not substantially changed when compared to remote prior MR examination dated 02/23/2012 and likely sequelae of a prior pseudocyst versus incidental pancreatic IPMN.7.  Cardiomegaly.8. Other chronic and incidental findings as detailed above. Aortic Atherosclerosis (ICD10-I70.0)."  Of note since the patient's last visit, pt has had PORTABLE CHEST 1 VIEW (Accession [id number]) completed on 03/06/2019 with results revealing "1. Moderate size right and small left pleural effusions appear not significantly changed since 02/27/19. 2. No new cardiopulmonary abnormality."  Of note since the patient's last visit, pt has had ECHOCARDIOGRAM  completed on 03/06/2019 with results revealing  "1. Left ventricular ejection fraction, by visual estimation, is 60 to 65%. The left ventricle has normal function. Normal left ventricular size. There is no left ventricular hypertrophy. 2. Left ventricular diastolic Doppler parameters are consistent with impaired relaxation pattern of LV diastolic filling. 3. Global right ventricle has mildly reduced systolic function.The right ventricular size is moderately enlarged. No increase in right ventricular wall thickness. 4. Left atrial size was normal.  5. Right atrial size was mildly dilated.  6. Trivial pericardial effusion is present. 7. The mitral valve is normal in structure. Trace mitral valve regurgitation. No evidence of mitral stenosis. 8. The tricuspid valve is normal in structure. Tricuspid valve regurgitation severe. 9. The aortic valve is normal in structure. Aortic valve regurgitation was not visualized by color flow Doppler. Structurally normal aortic valve, with no evidence of sclerosis or stenosis.10. The pulmonic valve was normal in structure. Pulmonic valve regurgitation is mild by color flow Doppler.11. Mildly elevated pulmonary artery systolic pressure.12. The inferior vena cava is normal in size with greater than 50% respiratory variability, suggesting right atrial pressure of 3 mmHg."  Of note since the patient's last visit, pt has had CT ANGIOGRAPHY CHEST WITH CONTRAST (Accession [id number]) completed on 03/06/2019 with results revealing "1. Pulmonary embolus arising from the distal left main pulmonary artery with extension into multiple left lower lobe pulmonary arterial branches. There is also incompletely obstructing pulmonary embolus in the proximal left upper lobe pulmonary artery. More distal pulmonary embolus in the right lower lobe pulmonary artery. Positive for acute PE with CT evidence of right heart strain (RV/LV Ratio = 1.6) consistent with at least submassive (intermediate risk) PE. The presence of right heart strain has been associated with an increased risk of morbidity and mortality. Please activate Code PE by paging [phone]. 2. Sizable pleural effusion on the right with smaller pleural effusion on the left. Consolidation and compressive atelectasis throughout most of the right lower lobe. Milder atelectasis left base. Nodular opacities on the right, likely metastatic foci.3.  Stable adenopathy compared to 1 week prior.4. Enlargement of spleen, incompletely visualized but documented CT 1 week prior. Concern for potential lymphoma. 5. Aortic atherosclerosis.  No aneurysm or dissection evident. Foci of coronary artery calcification noted."  Most recent lab results (03/06/2019) of CBC is as follows: all values are WNL except for Platelets at 72, Eosinophils Absolute at 0.6,Abs Immature Granulocytes at 0.08 .  On review of systems, pt reports rashes, digestive issues and denies back pain, abdominal pain and any other symptoms.   On PMHx the pt reports Hypothyroidism, MCI, Hypercholesterolemia, Tracheal anomaly, Hypertension, Pulmonary nodule, Atherosclerosis of aorta, anxiety, gait disorder, primary insomnia.   INTERVAL HISTORY:  Sarah [name] is a 87 y.o. female here for evaluation and management of Angioimmunoblastic T-cell lymphoma. The patient's last visit with [place] was on 08/03/2020. The pt reports that she is doing well overall. We are joined today by her daughter.  The pt reports no acute new symptoms since her last visit.  And her balance issues but is hesitant to use a walker or cane.  We discussed the importance of using at least a cane to reduce risk of falls.  No headaches.  No fevers or chills.  No new skin rashes.  No new lumps or bumps.  Lab results today 08/24/2020 of CBC w/diff and CMP -stable PET CT scan done on 08/23/2020 showed-no evidence of lymphoma recurrence.  Stable right lung nodules.  On review of systems, pt reports no other acute new focal symptoms.  MEDICAL HISTORY:  Past Medical History:  Diagnosis Date  . Allergy    codeine, thiazides  . Anxiety    new dx  . Arthritis   . Breast cancer ([place]) 03/14/12   bx=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,ER/PR=+,upper inner quad  . Cancer ([place])    breast  . Cataract   . DVT (deep venous thrombosis) ([place]) 02/2019   left leg  .  Dyspnea   . Edema    both legs feet and toe, abdomen  . Glaucoma    laser treated years ago  . HOH (hard of hearing)   . Hyperlipemia   . Hypertension   . Hypothyroidism   . Pancreatic cyst    benign  . PONV  (postoperative nausea and vomiting)   . Pulmonary embolism ([place]) 02/2019   bilateral   . Radiation 06/11/2012-07/12/2012   17 sessions 4250 cGy, 3 sessions 750 cGy  . Vertigo   . Wears glasses   . Wears partial dentures    partial upper     SURGICAL HISTORY: Past Surgical History:  Procedure Laterality Date  . ABDOMINAL HYSTERECTOMY  1966   1/2 ovary left in   . BREAST SURGERY  1992   lumpectomy-lt  . CATARACT EXTRACTION     b/l  . COLONOSCOPY    . EXCISION MASS NECK Left 03/17/2019    EXCISION MASS NECK (Left Neck)  . EXCISION MASS NECK Left 03/17/2019   Procedure: EXCISION MASS NECK;  Surgeon: Helayne Seminole, MD;  Location: [facility];  Service: ENT;  Laterality: Left;  . EYE SURGERY Bilateral    bilateral cataract removal  . IR IMAGING GUIDED PORT INSERTION  04/23/2019  . JOINT REPLACEMENT  2013   rt total knee  . JOINT REPLACEMENT  1995   lt total knee  . PARTIAL MASTECTOMY WITH NEEDLE LOCALIZATION  04/09/2012   Procedure: PARTIAL MASTECTOMY WITH NEEDLE LOCALIZATION;  Surgeon: Adin Hector, MD;  Location: [facility];  Service: General;  Laterality: Right;  . SKIN BIOPSY Left 03/17/2019   LEFT THIGH  . SKIN BIOPSY Left 03/17/2019   Procedure: Skin Biopsy Left Thigh;  Surgeon: Helayne Seminole, MD;  Location: [employer] [facility];  Service: ENT;  Laterality: Left;  . TONSILLECTOMY    . TOTAL KNEE ARTHROPLASTY  08/16/2011   Procedure: TOTAL KNEE ARTHROPLASTY;  Surgeon: Ninetta Lights, MD;  Location: [facility];  Service: Orthopedics;  Laterality: Right;     SOCIAL HISTORY: Social History   Socioeconomic History  . Marital status: Widowed    Spouse name: Not on file  . Number of children: 2  . Years of education: Not on file  . Highest education level: Not on file  Occupational History  . Occupation: Retired    Comment: Community education officer   Tobacco Use  . Smoking status: Never Smoker  . Smokeless tobacco: Never Used  Vaping Use  . Vaping Use: Never used   Substance and Sexual Activity  . Alcohol use: No  . Drug use: No  . Sexual activity: Not Currently  Other Topics Concern  . Not on file  Social History Narrative  . Not on file   Social Determinants of Health   Financial Resource Strain: Not on file  Food Insecurity: Not on file  Transportation Needs: Not on file  Physical Activity: Not on file  Stress: Not on file  Social Connections: Not on file  Intimate Partner Violence: Not on file     FAMILY HISTORY: Family History  Problem Relation Age of Onset  . Heart disease Father   . Cancer Maternal Aunt        stomach     ALLERGIES:   is allergic to citalopram, alendronate sodium, amoxicillin-pot clavulanate, codeine, latex, lisinopril, septra [sulfamethoxazole-trimethoprim], and thiazide-type diuretics.   MEDICATIONS:  Current Outpatient Medications  Medication Sig Dispense Refill  . amLODipine (NORVASC) 2.5 MG tablet Take 2.5-5 mg by  mouth See admin instructions. 5 mg in the morning, 2.5 mg in the evening    . amoxicillin (AMOXIL) 500 MG capsule Take 4 capsules (2,000 mg total) by mouth once as needed for up to 1 dose. 60 mins prior to dental procedure (Patient not taking: Reported on 06/23/2020) 4 capsule 0  . apixaban (ELIQUIS) 2.5 MG TABS tablet Take 1 tablet (2.5 mg total) by mouth 2 (two) times daily. 60 tablet 5  . benzonatate (TESSALON) 100 MG capsule Take 1 capsule (100 mg total) by mouth 3 (three) times daily as needed for cough. 30 capsule 0  . Cholecalciferol (VITAMIN D) 50 MCG (2000 UT) tablet Take 2,000 Units by mouth 2 (two) times daily.    . clonazepam (KLONOPIN) 0.125 MG disintegrating tablet Take 1 tablet (0.125 mg total) by mouth 2 (two) times daily. 60 tablet 0  . Cyanocobalamin (VITAMIN B-12 PO) Take 3,000 mcg by mouth daily.     Marland Kitchen levothyroxine (SYNTHROID) 25 MCG tablet Take 25 mcg by mouth daily before breakfast.     . lidocaine-prilocaine (EMLA) cream Apply to affected area once 30 g 3  . LORazepam  (ATIVAN) 0.5 MG tablet TAKE 1/2 TO 1 (ONE-HALF TO ONE) TABLET BY MOUTH EVERY 8 HOURS 30 tablet 0  . Multiple Vitamins-Minerals (PRESERVISION AREDS PO) Take 1 capsule by mouth 2 (two) times daily.     . ondansetron (ZOFRAN) 8 MG tablet Take 1 tablet (8 mg total) by mouth 2 (two) times daily as needed. Start on the third day after chemotherapy. 30 tablet 1  . polyethylene glycol (MIRALAX / GLYCOLAX) 17 g packet Take 17 g by mouth daily.    . prochlorperazine (COMPAZINE) 10 MG tablet Take 1 tablet (10 mg total) by mouth every 6 (six) hours as needed (Nausea or vomiting). (Patient not taking: Reported on 06/23/2020) 30 tablet 1  . sodium chloride (MURO 128) 5 % ophthalmic ointment Place 1 application into both eyes at bedtime.    Marland Kitchen XIIDRA 5 % SOLN Place 1 drop into both eyes daily as needed (dry eyes).     Marland Kitchen zolpidem (AMBIEN) 5 MG tablet Take 5 mg by mouth at bedtime as needed for sleep.      No current facility-administered medications for this visit.     REVIEW OF SYSTEMS:   10 Point review of Systems was done is negative except as noted above.  PHYSICAL EXAMINATION: ECOG FS:1 - Symptomatic but completely ambulatory  There were no vitals filed for this visit. Wt Readings from Last 3 Encounters:  08/03/20 110 lb 14.4 oz (50.3 kg)  07/13/20 112 lb (50.8 kg)  06/23/20 112 lb (50.8 kg)   Body mass index is 18.22 kg/m.    NAD GENERAL:alert, in no acute distress and comfortable SKIN: no acute rashes, no significant lesions EYES: conjunctiva are pink and non-injected, sclera anicteric OROPHARYNX: MMM, no exudates, no oropharyngeal erythema or ulceration NECK: supple, no JVD LYMPH:  no palpable lymphadenopathy in the cervical, axillary or inguinal regions LUNGS: clear to auscultation b/l with normal respiratory effort HEART: regular rate & rhythm ABDOMEN:  normoactive bowel sounds , non tender, not distended. Extremity: no pedal edema PSYCH: alert & oriented x 3 with fluent speech NEURO:  no focal motor/sensory deficits    LABORATORY DATA:  I have reviewed the data as listed  CBC Latest Ref Rng & Units 08/24/2020 08/03/2020 07/13/2020  WBC 4.0 - 10.5 K/uL 4.6 4.2 4.5  Hemoglobin 12.0 - 15.0 g/dL 13.4 13.0 13.3  Hematocrit  36.0 - 46.0 % 40.8 38.9 40.0  Platelets 150 - 400 K/uL 210 211 204    CMP Latest Ref Rng & Units 08/24/2020 08/03/2020 07/13/2020  Glucose 70 - 99 mg/dL 109(H) 92 98  BUN 8 - 23 mg/dL 25(H) 25(H) 30(H)  Creatinine 0.44 - 1.00 mg/dL 0.73 0.80 0.75  Sodium 135 - 145 mmol/L 143 139 141  Potassium 3.5 - 5.1 mmol/L 3.9 4.2 4.2  Chloride 98 - 111 mmol/L 104 105 105  CO2 22 - 32 mmol/L 27 28 28   Calcium 8.9 - 10.3 mg/dL 9.3 9.8 9.4  Total Protein 6.5 - 8.1 g/dL 6.8 6.7 6.8  Total Bilirubin 0.3 - 1.2 mg/dL 0.4 0.5 0.4  Alkaline Phos 38 - 126 U/L 80 75 74  AST 15 - 41 U/L 18 16 18   ALT 0 - 44 U/L 15 12 12    . Lab Results  Component Value Date   LDH 260 (H) 08/03/2020    Component     Latest Ref Rng & Units 03/27/2019  Hepatitis B Surface Ag     NON REACTIVE NON REACTIVE  Hep B Core Total Ab     NON REACTIVE NON REACTIVE  HCV Ab     NON REACTIVE NON REACTIVE  LDH     98 - 192 U/L 252 (H)   . Lab Results  Component Value Date   LDH 257 (H) 08/24/2020      04/08/2019 NM PET Image Initial (PI) Skull Base To Thigh (Accession [id number])  04/04/2019 PATHOLOGY   04/07/2019 ECHOCARDIOGRAM  03/28/2019 PATHOLOGY   03/06/2019 CT ANGIOGRAPHY CHEST WITH CONTRAST (Accession [id number])  04/07/2019 ECHOCARDIOGRAM  03/06/2019 ECHOCARDIOGRAM     03/06/2019 PORTABLE CHEST 1 VIEW (Accession [id number])    02/27/2019 CT CHEST, ABDOMEN, AND PELVIS WITH CONTRAST (Accession [id number])    02/17/2019 CHEST XRAY - 2 VIEW (Accession [id number])    RADIOGRAPHIC STUDIES: I have personally reviewed the radiological images as listed and agreed with the findings in the report. No results found.   ASSESSMENT & PLAN:   BRITTNAE ASCHENBRENNER is a 87 y.o.  female with:  1. 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 SKIN, LEFT THIGH, BIOPSY: - Involvement by angioimmunoblastic T-cell lymphoma." -04/07/2019 Echocardiogram - normal EF -04/08/2019 NM PET Image Initial (PI) Skull Base To Thigh (Accession [id number]) revealed "1. Widespread hypermetabolic adenopathy in the neck, chest, and abdomen/pelvis, primarily in the Deauville 4 and Deauville 5 range. There is also hypermetabolic activity along the posterior nasopharynx in lingual tonsillar regions potentially indicating sites of involvement. 2. The subtle hypodensities in the spine and bony pelvis are not appreciably hypermetabolic may be incidental, surveillance is suggested. 3. Bilateral thyroid activity but especially on the left. Probably from thyroiditis. 4. A 6 mm subpleural nodule anteriorly in the right lower lobe not appreciably hypermetabolic but is below sensitive PET-CT size thresholds and merits surveillance. 5. Mildly accentuated diffuse splenic activity without splenomegaly or focal splenic lesion identified. 6. Other imaging findings of potential clinical significance: Aortic Atherosclerosis (ICD10-I70.0). Coronary atherosclerosis. Large right and small left pleural effusions. Moderate cardiomegaly. Small pericardial effusion. Mild mesenteric edema. Large left renal cyst. Fullness of both renal collecting systems with suspected nonobstructive left nephrolithiasis. Degenerative grade 1 anterolisthesis at L4-5 at L5-S1. Trace pelvic ascites." -05/28/2019 PET/CT ([id number]) revealed "Complete metabolic response to therapy". -09/16/2019 PET/CT ([id number]) revealed "1. Unfortunately evidence of lymphoma recurrence at sites of previous hypermetabolic  adenopathy. Hypermetabolic lymphoid tissue is new from comparison PET-CT 05/28/2019 but at  same locations as PET-CT 04/08/2019. The number of metastatic lymph nodes is less than 04/08/2019. Lymphoid tissue at the RIGHT skull base is larger. 2. Three foci of hypermetabolic recurrence are present, lymphoid tissue at the RIGHT base of skull, hypermetabolic RIGHT hilar lymph node and hypermetabolic lymph node in the porta hepatis." -11/25/2019 PET/CT ([id number]) revealed "1. Marked interval improvement in the hypermetabolic disease identified previously in the posterior right nasopharynx, compatible with Deauville 4 today. 2. Decreased hypermetabolism in the right hilar and porta hepatis lymph nodes identified previously (Deauville 3). No new sites of hypermetabolic disease in the chest, abdomen, or pelvis. 3. Similar diffuse thyroid uptake, left greater than right. 4. No substantial change in right-sided pulmonary nodules." -04/09/2020 PET/CT ([id number]) revealed "No evidence progressive lymphoma."  2. Pulmonary Embolism -extensive likely related to extensive malignancy  3. H/o Breast Cancer  -The patient had bilateral diagnostic  mammography [facility] 07/11/2011. This  showed some calcifications in the right  breast, which seemed a little bit more  prominent than prior. In the left breast  there was an area of possible  architectural distortion. However left  breast ultrasound the same day showed  no abnormality. 6 month followup was  suggested, and on 02/28/2012 the  patient again had bilateral diagnostic  mammography, now with right  ultrasonography. The microcalcifications  in the right breast appeared increased.  Ultrasound showed a hypoechoic lesion  measuring 5 mm, with no associated  shadowing. This had been previously  noted and appeared unchanged. Anterior  to that there was a small hypoechoic  mass measuring 4 mm in diameter.  There was felt to be suspicious, and on  03/14/2012 the patient underwent biopsy  of the right breast mass, showing (SAA  [id number]) ductal carcinoma in situ,   intermediate grade, estrogen receptor  100% and progesterone receptor 100%  positive.   -Bilateral breast MRI was obtained  03/26/2012. This showed only post  biopsy changes in the right breast,  associated with an area of non-masslike  enhancement measuring 2.4 cm. The  left breast was unremarkable, and there  was no enlarged axillary or internal  mammary adenopathy noted.  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 The patient's  subsequent history is as detailed below.   PLAN: -Discussed pt labwork today, 08/24/2020;stable. -Patient has no lab or clinical evidence of lymphoma progression/recurrence at this time. -PET/CT on 08/23/2020 shows no evidence of lymphoma recurrence. -The pt has no prohibitive toxicities from continuing next cycle of Brentuximab-Vedotin at this time. -Continue 2.5 Eliquis BID   FOLLOW-UP: F/u in 3 weeks as scheduled for next treatment   The total time spent in the appointment was 30 minutes and more than 50% was on counseling and direct patient cares, review of PET scan, ordering and management of chemotherapy.   All of the patient's questions were answered with apparent satisfaction. The patient knows to call the clinic with any problems, questions or concerns.    Sullivan Lone MD [place] AAHIVMS [facility] [employer] [facility]  (Office):       [phone] (Work cell):  [phone] (Fax):           [phone]  08/23/2020 10:15 PM   I, Reinaldo Raddle, am acting as scribe for Dr.  Sullivan Lone, MD.  .I have reviewed the above documentation for accuracy and completeness, and I agree with the above. Brunetta Genera MD

## 2020-08-24 ENCOUNTER — Inpatient Hospital Stay: Payer: Medicare Other | Attending: Hematology

## 2020-08-24 ENCOUNTER — Inpatient Hospital Stay: Payer: Medicare Other

## 2020-08-24 ENCOUNTER — Inpatient Hospital Stay (HOSPITAL_BASED_OUTPATIENT_CLINIC_OR_DEPARTMENT_OTHER): Payer: Medicare Other | Admitting: Hematology

## 2020-08-24 ENCOUNTER — Inpatient Hospital Stay: Payer: Medicare Other | Admitting: Dietician

## 2020-08-24 VITALS — BP 130/90 | HR 84 | Temp 97.8°F | Resp 18 | Ht 66.0 in | Wt 112.9 lb

## 2020-08-24 DIAGNOSIS — I2699 Other pulmonary embolism without acute cor pulmonale: Secondary | ICD-10-CM | POA: Diagnosis not present

## 2020-08-24 DIAGNOSIS — C865 Angioimmunoblastic T-cell lymphoma: Secondary | ICD-10-CM | POA: Insufficient documentation

## 2020-08-24 DIAGNOSIS — Z853 Personal history of malignant neoplasm of breast: Secondary | ICD-10-CM | POA: Diagnosis not present

## 2020-08-24 DIAGNOSIS — Z95828 Presence of other vascular implants and grafts: Secondary | ICD-10-CM

## 2020-08-24 DIAGNOSIS — Z5111 Encounter for antineoplastic chemotherapy: Secondary | ICD-10-CM

## 2020-08-24 DIAGNOSIS — Z5112 Encounter for antineoplastic immunotherapy: Secondary | ICD-10-CM | POA: Diagnosis not present

## 2020-08-24 DIAGNOSIS — C8440 Peripheral T-cell lymphoma, not classified, unspecified site: Secondary | ICD-10-CM

## 2020-08-24 DIAGNOSIS — Z7189 Other specified counseling: Secondary | ICD-10-CM

## 2020-08-24 LAB — CBC WITH DIFFERENTIAL/PLATELET
Abs Immature Granulocytes: 0.01 10*3/uL (ref 0.00–0.07)
Basophils Absolute: 0 10*3/uL (ref 0.0–0.1)
Basophils Relative: 1 %
Eosinophils Absolute: 0.1 10*3/uL (ref 0.0–0.5)
Eosinophils Relative: 2 %
HCT: 40.8 % (ref 36.0–46.0)
Hemoglobin: 13.4 g/dL (ref 12.0–15.0)
Immature Granulocytes: 0 %
Lymphocytes Relative: 16 %
Lymphs Abs: 0.8 10*3/uL (ref 0.7–4.0)
MCH: 30 pg (ref 26.0–34.0)
MCHC: 32.8 g/dL (ref 30.0–36.0)
MCV: 91.3 fL (ref 80.0–100.0)
Monocytes Absolute: 0.5 10*3/uL (ref 0.1–1.0)
Monocytes Relative: 12 %
Neutro Abs: 3.2 10*3/uL (ref 1.7–7.7)
Neutrophils Relative %: 69 %
Platelets: 210 10*3/uL (ref 150–400)
RBC: 4.47 MIL/uL (ref 3.87–5.11)
RDW: 15.2 % (ref 11.5–15.5)
WBC: 4.6 10*3/uL (ref 4.0–10.5)
nRBC: 0 % (ref 0.0–0.2)

## 2020-08-24 LAB — CMP (CANCER CENTER ONLY)
ALT: 15 U/L (ref 0–44)
AST: 18 U/L (ref 15–41)
Albumin: 4 g/dL (ref 3.5–5.0)
Alkaline Phosphatase: 80 U/L (ref 38–126)
Anion gap: 12 (ref 5–15)
BUN: 25 mg/dL — ABNORMAL HIGH (ref 8–23)
CO2: 27 mmol/L (ref 22–32)
Calcium: 9.3 mg/dL (ref 8.9–10.3)
Chloride: 104 mmol/L (ref 98–111)
Creatinine: 0.73 mg/dL (ref 0.44–1.00)
GFR, Estimated: >60 mL/min (ref >60)
Glucose, Bld: 109 mg/dL — ABNORMAL HIGH (ref 70–99)
Potassium: 3.9 mmol/L (ref 3.5–5.1)
Sodium: 143 mmol/L (ref 135–145)
Total Bilirubin: 0.4 mg/dL (ref 0.3–1.2)
Total Protein: 6.8 g/dL (ref 6.5–8.1)

## 2020-08-24 LAB — LACTATE DEHYDROGENASE: LDH: 257 U/L — ABNORMAL HIGH (ref 98–192)

## 2020-08-24 MED ORDER — SODIUM CHLORIDE 0.9% FLUSH
10.0000 mL | INTRAVENOUS | Status: DC | PRN
  Administered 2020-08-24: 10 mL
  Filled 2020-08-24: qty 10

## 2020-08-24 MED ORDER — ONDANSETRON HCL 4 MG/2ML IJ SOLN
4.0000 mg | Freq: Once | INTRAMUSCULAR | Status: AC
  Administered 2020-08-24: 4 mg via INTRAVENOUS

## 2020-08-24 MED ORDER — SODIUM CHLORIDE 0.9% FLUSH
10.0000 mL | Freq: Once | INTRAVENOUS | Status: AC
  Administered 2020-08-24: 10 mL
  Filled 2020-08-24: qty 10

## 2020-08-24 MED ORDER — ACETAMINOPHEN 325 MG PO TABS
ORAL_TABLET | ORAL | Status: AC
  Filled 2020-08-24: qty 2

## 2020-08-24 MED ORDER — DIPHENHYDRAMINE HCL 50 MG/ML IJ SOLN
INTRAMUSCULAR | Status: AC
  Filled 2020-08-24: qty 1

## 2020-08-24 MED ORDER — HEPARIN SOD (PORK) LOCK FLUSH 100 UNIT/ML IV SOLN
500.0000 [IU] | Freq: Once | INTRAVENOUS | Status: AC | PRN
Start: 2020-08-24 — End: 2020-08-24
  Administered 2020-08-24: 500 [IU]
  Filled 2020-08-24: qty 5

## 2020-08-24 MED ORDER — ONDANSETRON HCL 4 MG/2ML IJ SOLN
INTRAMUSCULAR | Status: AC
  Filled 2020-08-24: qty 2

## 2020-08-24 MED ORDER — DIPHENHYDRAMINE HCL 50 MG/ML IJ SOLN
25.0000 mg | Freq: Once | INTRAMUSCULAR | Status: AC
  Administered 2020-08-24: 25 mg via INTRAVENOUS

## 2020-08-24 MED ORDER — FAMOTIDINE IN NACL 20-0.9 MG/50ML-% IV SOLN
20.0000 mg | Freq: Once | INTRAVENOUS | Status: AC
  Administered 2020-08-24: 20 mg via INTRAVENOUS

## 2020-08-24 MED ORDER — SODIUM CHLORIDE 0.9 % IV SOLN
1.8000 mg/kg | Freq: Once | INTRAVENOUS | Status: AC
  Administered 2020-08-24: 100 mg via INTRAVENOUS
  Filled 2020-08-24: qty 20

## 2020-08-24 MED ORDER — FAMOTIDINE IN NACL 20-0.9 MG/50ML-% IV SOLN
INTRAVENOUS | Status: AC
  Filled 2020-08-24: qty 50

## 2020-08-24 MED ORDER — DEXAMETHASONE SODIUM PHOSPHATE 10 MG/ML IJ SOLN
INTRAMUSCULAR | Status: AC
  Filled 2020-08-24: qty 1

## 2020-08-24 MED ORDER — ACETAMINOPHEN 325 MG PO TABS
650.0000 mg | ORAL_TABLET | Freq: Once | ORAL | Status: AC
Start: 2020-08-24 — End: 2020-08-24
  Administered 2020-08-24: 650 mg via ORAL

## 2020-08-24 MED ORDER — SODIUM CHLORIDE 0.9 % IV SOLN
Freq: Once | INTRAVENOUS | Status: AC
Start: 2020-08-24 — End: 2020-08-24
  Filled 2020-08-24: qty 250

## 2020-08-24 MED ORDER — DEXAMETHASONE SODIUM PHOSPHATE 10 MG/ML IJ SOLN
5.0000 mg | Freq: Once | INTRAMUSCULAR | Status: AC
  Administered 2020-08-24: 5 mg via INTRAVENOUS

## 2020-08-24 NOTE — Patient Instructions (Signed)
[place] Discharge Instructions for Patients Receiving Chemotherapy  Today you received the following chemotherapy agents: Adcetris  To help prevent nausea and vomiting after your treatment, we encourage you to take your nausea medication as directed.   If you develop nausea and vomiting that is not controlled by your nausea medication, call the clinic.   BELOW ARE SYMPTOMS THAT SHOULD BE REPORTED IMMEDIATELY:  *FEVER GREATER THAN 100.5 F  *CHILLS WITH OR WITHOUT FEVER  NAUSEA AND VOMITING THAT IS NOT CONTROLLED WITH YOUR NAUSEA MEDICATION  *UNUSUAL SHORTNESS OF BREATH  *UNUSUAL BRUISING OR BLEEDING  TENDERNESS IN MOUTH AND THROAT WITH OR WITHOUT PRESENCE OF ULCERS  *URINARY PROBLEMS  *BOWEL PROBLEMS  UNUSUAL RASH Items with * indicate a potential emergency and should be followed up as soon as possible.  Feel free to call the clinic should you have any questions or concerns. The clinic phone number is [phone].  Please show the [place] at check-in to the Emergency Department and triage nurse.

## 2020-08-24 NOTE — Progress Notes (Signed)
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 She is followed by Dr. Irene Limbo.   Past medical history of HTN, AV heart block, acute PE, hypothyroidism, osteopenia, HLD, chronic pressure ulcer, HOH, DVT, and breast cancer s/p partial mastectomy with seeding in 2013.  Met with patient in infusion. She reports having involuntary spasms in lower extremities today that resolves during visit. Patient reports not having a big appetite normally, denies nutrition impact symptoms. She has been drinking vegetable smoothies for many years, says "it gives me energy and I feel great." She usually has hot cereal with blueberries for breakfast, drinks 1-2 smoothies made with kale, spinach, avocado, 2 bananas, and almond milk for lunch or sometimes dinner and drinks 3 Ensure Complete. Patient reports also eating chicken, pastrami sandwiches, spaghetti. Patient reports her daughter wants her to eat more carbs and gain weight, however patient feels she is at a healthy weight.   Nutrition Focused Physical Exam: deferred   Medications: Ativan, Zofran, Compazine, Miralax, Ambien, MVI, Vit D, B12   Labs: Glucose 109, BUN 25   Anthropometrics: Weight 112 lb 14.4 oz today increased from 110 lb 14.4 oz on 3/15  Height: 5'6" Weight: 51.2 kg UBW: 115 lb (04/21) BMI: 18.22  NUTRITION DIAGNOSIS: Food and nutrition knowledge deficit related to cancer and related treatment as evidenced low-carb dietary recall    INTERVENTION:  Educated on increased nutrition needs during treatment, importance of balanced diet and adequate intake of macronutrients Encouraged frequent meals and snacks, trying to eat every 3 hours Discussed ways to increase calories and protein and offered snack ideas Patient will try substituting vanilla Ensure for almond milk in daily smoothie, increasing supplement intake to 4 times/day Summary of  discussion with plan written down for patient on back of handout Fact sheets given RD contact information provided    MONITORING, EVALUATION, GOAL: Patient will maintain adequate calories and protein to prevent weight loss   Next Visit: Tuesday, April 26 in infusion

## 2020-08-25 DIAGNOSIS — M25551 Pain in right hip: Secondary | ICD-10-CM | POA: Diagnosis not present

## 2020-08-26 LAB — GLUCOSE, CAPILLARY: Glucose-Capillary: 27 mg/dL — CL (ref 70–99)

## 2020-08-31 ENCOUNTER — Other Ambulatory Visit: Payer: Self-pay | Admitting: Hematology

## 2020-09-13 NOTE — Progress Notes (Signed)
HEMATOLOGY/ONCOLOGY CLINIC NOTE  Date of Service: 09/14/2020  Patient Care Team: Lajean Manes, MD as PCP - General (Internal Medicine) Buford Dresser, MD as PCP - Cardiology (Cardiology)  REFERRING PHYSICIAN: Lajean Manes, MD  CHIEF COMPLAINTS/PURPOSE OF CONSULTATION:   Continue mx of Recently Diagnosed Angioimmunoblastic T cell lymphoma     HISTORY OF PRESENTING ILLNESS:   Sarah [name] is a wonderful 87 y.o. female who has been referred to [place] by Lajean Manes, MD for evaluation and management of Possible lymphoma . She is accompanied today by her daughter Sarah [name] . The pt reports that she is doing well overall.   Pending Surgical pathology from 03/17/2019 Dr.Manny will contact clinic about pathology   August 25th/27th she began coughing and sneezing. On Sept 3 went to doctors and they said it was allergies. She began having rashes (little red dots). Went to PCP and they prescribed prednisone and for several days and the medication helped. But as soon as she completed the prescription symptoms reoccurred.   She had a chest X Ray done because she was having a severe cough on 02/17/2019 revealing "bilateral effusions in this patient with history of breast cancer,of uncertain significance with question of right lower lobe pulmonary nodule, consider chest CT for further assessment. Atherosclerotic changes in the thoracic aorta."  Dr. Amy Martinique biopsied the rashes on her back 02/21/2019 and it was determined that it could be because of an allergy to medication. Dr.Stoneking stopped her blood pressure medication from September- October but ther rash still present. The itching started 1 to 2 months ago. The rash would come and go and was mostly on her back. She still has rash on her left thigh.  She is using triaconazole and cetrizine.  She has lumps behind ear and abdomin and she states that they have swollen twice the size within 1 and half months  She has no appetite and  has changes in her taste that began around August. She has changes in bowels. They are more loose and smaller In size. She also has no control over bowels.  She feels fatigued and it has increased since August.   She has a cough that starts in September  that has made her hoarse.   She had a CT scan on 02/27/2019 because she was having severe cough and her stomach was very extended. Revealing "1. Extensive lymphadenopathy throughout the chest, abdomen, and pelvis, with index lymph nodes identified above. Splenomegaly. Findings are most consistent with lymphoma with recurrent, metastatic breast malignancy less favored. 2. There is generally osteopenic appearance of the skeleton. There are numerous subtle hypodense lesions, particularly of the pelvis (e.g. Series 2, image 88) and select vertebral bodies, for example L4 (series 8, image 92). This is suspicious although not definitive for osseous metastatic disease. Characterization for metabolic activity by nuclear scintigraphic bone scan or PET-CT would be helpful to further evaluate. 3. Moderate right, small left pleural effusions with associated atelectasis or consolidation. Subpleural radiation fibrosis of the anterior right lung. 4. There is a new subpleural pulmonary nodule of the anterior right middle lobe measuring 6 mm (series 4, image 105), nonspecific. There is no obvious pleural thickening or nodularity definitive for metastatic disease. 5.  Small volume ascites. 6. There is a fluid attenuation lesion of the pancreatic uncinate measuring 2.4 x 1.3 cm (series 2, image 63), not substantially changed when compared to remote prior MR examination dated 02/23/2012 and likely sequelae of a prior pseudocyst versus incidental pancreatic IPMN. 7.  Cardiomegaly."  Hospitalized from 03/06/2019-03/08/2019. She presenting with acute shortness of breath, leg swelling,left hand swelling and some weight gain despite poor appetite. In ED, CTA chest revealed at least  submassive PE, moderate right pleural effusion, diffuse LAD concerning for lymphoma. Started on heparin drip. PCCM consulted for guidance. She had thoracocentesis with removal of 1200 cc cloudy exudative pleural fluid. Cardiology consulted for elevated which was thought to be demand ischemia from right heart strain in the setting of PE. Patient was transitioned to subcu Lovenox by pulmonology the next day and remained stable. Unusual appearance of tracheal air column at the thoracic inlet. Correlate with any history of swallowing difficulty or changes in voice with dedicated imaging with chest CT and or CT of the neck as indicated.  She has a nonhealing ulcer on her right tibialis anterior that starting weeping one week ago. Of note since the patient's last visit, pt has had CHEST XRAY - 2 VIEW (Accession [id number]) completed on 02/17/2019 with results revealing "Bilateral effusions in this patient with history of breast cancer,of uncertain significance with question of right lower lobepulmonary nodule, consider chest CT for furtherassessment.Unusual appearance of tracheal air column at the thoracic inlet.Correlate with any history of swallowing difficulty or changes in voice with dedicated imaging with chest CT and or CT of the neck as Indicated. Atherosclerotic changes in the thoracic aorta."  Of note since the patient's last visit, pt has had CT CHEST, ABDOMEN, AND PELVIS WITH CONTRAST (Accession [id number]) completed on 02/27/2019 with results revealing "1. Extensive lymphadenopathy throughout the chest, abdomen, and pelvis, with index lymph nodes identified above. Splenomegaly.Findings are most consistent with lymphoma with recurrent, metastatic breast malignancy less favored. 2. There is generally osteopenic appearance of the skeleton. There are numerous subtle hypodense lesions, particularly of the pelvis (e.g. Series 2, image 88) and select vertebral bodies, for example L4 (series 8, image 92).  This is suspicious although not definitive for osseous metastatic disease. Characterization for metabolic activity by nuclear scintigraphic bone scan or PET-CT would be helpful to further evaluate.  3. Moderate right, small left pleural effusions with associated atelectasis or consolidation. Subpleural radiation fibrosis of the anterior right lung.4. There is a new subpleural pulmonary nodule of the anterior right middle lobe measuring 6 mm (series 4, image 105), nonspecific. There is no obvious pleural thickening or nodularity definitive for metastatic disease.5.  Small volume ascites. 6. There is a fluid attenuation lesion of the pancreatic uncinate measuring 2.4 x 1.3 cm (series 2, image 63), not substantially changed when compared to remote prior MR examination dated 02/23/2012 and likely sequelae of a prior pseudocyst versus incidental pancreatic IPMN.7.  Cardiomegaly.8. Other chronic and incidental findings as detailed above. Aortic Atherosclerosis (ICD10-I70.0)."  Of note since the patient's last visit, pt has had PORTABLE CHEST 1 VIEW (Accession [id number]) completed on 03/06/2019 with results revealing "1. Moderate size right and small left pleural effusions appear not significantly changed since 02/27/19. 2. No new cardiopulmonary abnormality."  Of note since the patient's last visit, pt has had ECHOCARDIOGRAM  completed on 03/06/2019 with results revealing  "1. Left ventricular ejection fraction, by visual estimation, is 60 to 65%. The left ventricle has normal function. Normal left ventricular size. There is no left ventricular hypertrophy. 2. Left ventricular diastolic Doppler parameters are consistent with impaired relaxation pattern of LV diastolic filling. 3. Global right ventricle has mildly reduced systolic function.The right ventricular size is moderately enlarged. No increase in right ventricular wall thickness. 4. Left atrial size was normal.  5. Right atrial size was mildly dilated.  6. Trivial pericardial effusion is present. 7. The mitral valve is normal in structure. Trace mitral valve regurgitation. No evidence of mitral stenosis. 8. The tricuspid valve is normal in structure. Tricuspid valve regurgitation severe. 9. The aortic valve is normal in structure. Aortic valve regurgitation was not visualized by color flow Doppler. Structurally normal aortic valve, with no evidence of sclerosis or stenosis.10. The pulmonic valve was normal in structure. Pulmonic valve regurgitation is mild by color flow Doppler.11. Mildly elevated pulmonary artery systolic pressure.12. The inferior vena cava is normal in size with greater than 50% respiratory variability, suggesting right atrial pressure of 3 mmHg."  Of note since the patient's last visit, pt has had CT ANGIOGRAPHY CHEST WITH CONTRAST (Accession [id number]) completed on 03/06/2019 with results revealing "1. Pulmonary embolus arising from the distal left main pulmonary artery with extension into multiple left lower lobe pulmonary arterial branches. There is also incompletely obstructing pulmonary embolus in the proximal left upper lobe pulmonary artery. More distal pulmonary embolus in the right lower lobe pulmonary artery. Positive for acute PE with CT evidence of right heart strain (RV/LV Ratio = 1.6) consistent with at least submassive (intermediate risk) PE. The presence of right heart strain has been associated with an increased risk of morbidity and mortality. Please activate Code PE by paging [phone]. 2. Sizable pleural effusion on the right with smaller pleural effusion on the left. Consolidation and compressive atelectasis throughout most of the right lower lobe. Milder atelectasis left base. Nodular opacities on the right, likely metastatic foci.3.  Stable adenopathy compared to 1 week prior.4. Enlargement of spleen, incompletely visualized but documented CT 1 week prior. Concern for potential lymphoma. 5. Aortic atherosclerosis.  No aneurysm or dissection evident. Foci of coronary artery calcification noted."  Most recent lab results (03/06/2019) of CBC is as follows: all values are WNL except for Platelets at 72, Eosinophils Absolute at 0.6,Abs Immature Granulocytes at 0.08 .  On review of systems, pt reports rashes, digestive issues and denies back pain, abdominal pain and any other symptoms.   On PMHx the pt reports Hypothyroidism, MCI, Hypercholesterolemia, Tracheal anomaly, Hypertension, Pulmonary nodule, Atherosclerosis of aorta, anxiety, gait disorder, primary insomnia.   INTERVAL HISTORY:  Sarah [name] is a 87 y.o. female here for evaluation and management of Angioimmunoblastic T-cell lymphoma. The patient's last visit with [place] was on 08/24/2020. The pt reports that she is doing well overall. We are joined today by her daughter.  The pt reports that she has still been losing weight. She is down another three pounds. The pt notes she is still eating very healthy. The pt notes she is now eating peanut butter and jelly sandwiches. The pt notes that she currently intermittently drinks three cans of Boost daily. The pt's daughter notes that she drinks the Boost that is low carb and low sugar and only drinks this twice daily. The pt notes major changes from a cognitive standpoint from the last visit.  Lab results today 09/14/2020 of CBC w/diff and CMP is as follows: all values are WNL except for Glucose of 122, BUN of 24. 09/14/2020 LDH of 287.  On review of systems, pt reports weight loss, gradual memory loss and denies sudden cognitive changes, abdominal pain, SOB, chest pain, acute skin rashes, and any other symptoms.  MEDICAL HISTORY:  Past Medical History:  Diagnosis Date  . Allergy    codeine, thiazides  . Anxiety    new dx  . Arthritis   .  Breast cancer ([place]) 03/14/12   bx=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,ER/PR=+,upper inner quad  . Cancer ([place])    breast  . Cataract   . DVT  (deep venous thrombosis) ([place]) 02/2019   left leg  . Dyspnea   . Edema    both legs feet and toe, abdomen  . Glaucoma    laser treated years ago  . HOH (hard of hearing)   . Hyperlipemia   . Hypertension   . Hypothyroidism   . Pancreatic cyst    benign  . PONV (postoperative nausea and vomiting)   . Pulmonary embolism ([place]) 02/2019   bilateral   . Radiation 06/11/2012-07/12/2012   17 sessions 4250 cGy, 3 sessions 750 cGy  . Vertigo   . Wears glasses   . Wears partial dentures    partial upper     SURGICAL HISTORY: Past Surgical History:  Procedure Laterality Date  . ABDOMINAL HYSTERECTOMY  1966   1/2 ovary left in   . BREAST SURGERY  1992   lumpectomy-lt  . CATARACT EXTRACTION     b/l  . COLONOSCOPY    . EXCISION MASS NECK Left 03/17/2019    EXCISION MASS NECK (Left Neck)  . EXCISION MASS NECK Left 03/17/2019   Procedure: EXCISION MASS NECK;  Surgeon: Helayne Seminole, MD;  Location: [facility];  Service: ENT;  Laterality: Left;  . EYE SURGERY Bilateral    bilateral cataract removal  . IR IMAGING GUIDED PORT INSERTION  04/23/2019  . JOINT REPLACEMENT  2013   rt total knee  . JOINT REPLACEMENT  1995   lt total knee  . PARTIAL MASTECTOMY WITH NEEDLE LOCALIZATION  04/09/2012   Procedure: PARTIAL MASTECTOMY WITH NEEDLE LOCALIZATION;  Surgeon: Adin Hector, MD;  Location: [facility];  Service: General;  Laterality: Right;  . SKIN BIOPSY Left 03/17/2019   LEFT THIGH  . SKIN BIOPSY Left 03/17/2019   Procedure: Skin Biopsy Left Thigh;  Surgeon: Helayne Seminole, MD;  Location: [facility];  Service: ENT;  Laterality: Left;  . TONSILLECTOMY    . TOTAL KNEE ARTHROPLASTY  08/16/2011   Procedure: TOTAL KNEE ARTHROPLASTY;  Surgeon: Ninetta Lights, MD;  Location: [facility];  Service: Orthopedics;  Laterality: Right;     SOCIAL HISTORY: Social History   Socioeconomic History  . Marital status: Widowed    Spouse name: Not on file  . Number of children: 2  .  Years of education: Not on file  . Highest education level: Not on file  Occupational History  . Occupation: Retired    Comment: Community education officer   Tobacco Use  . Smoking status: Never Smoker  . Smokeless tobacco: Never Used  Vaping Use  . Vaping Use: Never used  Substance and Sexual Activity  . Alcohol use: No  . Drug use: No  . Sexual activity: Not Currently  Other Topics Concern  . Not on file  Social History Narrative  . Not on file   Social Determinants of Health   Financial Resource Strain: Not on file  Food Insecurity: Not on file  Transportation Needs: Not on file  Physical Activity: Not on file  Stress: Not on file  Social Connections: Not on file  Intimate Partner Violence: Not on file     FAMILY HISTORY: Family History  Problem Relation Age of Onset  . Heart disease Father   . Cancer Maternal Aunt        stomach     ALLERGIES:  is allergic to citalopram, alendronate sodium, amoxicillin-pot clavulanate, codeine, latex, lisinopril, septra [sulfamethoxazole-trimethoprim], and thiazide-type diuretics.   MEDICATIONS:  Current Outpatient Medications  Medication Sig Dispense Refill  . amLODipine (NORVASC) 2.5 MG tablet Take 2.5-5 mg by mouth See admin instructions. 5 mg in the morning, 2.5 mg in the evening    . amoxicillin (AMOXIL) 500 MG capsule Take 4 capsules (2,000 mg total) by mouth once as needed for up to 1 dose. 60 mins prior to dental procedure (Patient not taking: Reported on 06/23/2020) 4 capsule 0  . apixaban (ELIQUIS) 2.5 MG TABS tablet Take 1 tablet (2.5 mg total) by mouth 2 (two) times daily. 60 tablet 5  . benzonatate (TESSALON) 100 MG capsule Take 1 capsule (100 mg total) by mouth 3 (three) times daily as needed for cough. 30 capsule 0  . Cholecalciferol (VITAMIN D) 50 MCG (2000 UT) tablet Take 2,000 Units by mouth 2 (two) times daily.    . clonazepam (KLONOPIN) 0.125 MG disintegrating tablet Take 1 tablet (0.125 mg total) by mouth 2 (two) times  daily. 60 tablet 0  . Cyanocobalamin (VITAMIN B-12 PO) Take 3,000 mcg by mouth daily.     Marland Kitchen levothyroxine (SYNTHROID) 25 MCG tablet Take 25 mcg by mouth daily before breakfast.     . lidocaine-prilocaine (EMLA) cream Apply to affected area once 30 g 3  . LORazepam (ATIVAN) 0.5 MG tablet TAKE 1/2 (ONE-HALF) TO 1 TABLET BY MOUTH EVERY 8 HOURS 30 tablet 0  . Multiple Vitamins-Minerals (PRESERVISION AREDS PO) Take 1 capsule by mouth 2 (two) times daily.     . ondansetron (ZOFRAN) 8 MG tablet Take 1 tablet (8 mg total) by mouth 2 (two) times daily as needed. Start on the third day after chemotherapy. 30 tablet 1  . polyethylene glycol (MIRALAX / GLYCOLAX) 17 g packet Take 17 g by mouth daily.    . prochlorperazine (COMPAZINE) 10 MG tablet Take 1 tablet (10 mg total) by mouth every 6 (six) hours as needed (Nausea or vomiting). (Patient not taking: Reported on 06/23/2020) 30 tablet 1  . sodium chloride (MURO 128) 5 % ophthalmic ointment Place 1 application into both eyes at bedtime.    Marland Kitchen XIIDRA 5 % SOLN Place 1 drop into both eyes daily as needed (dry eyes).     Marland Kitchen zolpidem (AMBIEN) 5 MG tablet Take 5 mg by mouth at bedtime as needed for sleep.      No current facility-administered medications for this visit.     REVIEW OF SYSTEMS:   10 Point review of Systems was done is negative except as noted above.  PHYSICAL EXAMINATION: ECOG FS:1 - Symptomatic but completely ambulatory  Vitals:   09/14/20 1402  BP: 122/75  Pulse: 74  Resp: 18  Temp: (!) 97.5 F (36.4 C)  SpO2: 96%   Wt Readings from Last 3 Encounters:  09/14/20 109 lb 1.6 oz (49.5 kg)  08/24/20 112 lb 14.4 oz (51.2 kg)  08/03/20 110 lb 14.4 oz (50.3 kg)   Body mass index is 17.61 kg/m.    Exam was given in a chair.   GENERAL:alert, in no acute distress and comfortable SKIN: no acute rashes, no significant lesions EYES: conjunctiva are pink and non-injected, sclera anicteric OROPHARYNX: MMM, no exudates, no oropharyngeal  erythema or ulceration NECK: supple, no JVD LYMPH:  no palpable lymphadenopathy in the cervical, axillary or inguinal regions LUNGS: clear to auscultation b/l with normal respiratory effort HEART: regular rate & rhythm ABDOMEN:  normoactive bowel sounds ,  non tender, not distended. Extremity: no pedal edema PSYCH: alert & oriented x 3 with fluent speech NEURO: no focal motor/sensory deficits    LABORATORY DATA:  I have reviewed the data as listed  CBC Latest Ref Rng & Units 09/14/2020 08/24/2020 08/03/2020  WBC 4.0 - 10.5 K/uL 5.1 4.6 4.2  Hemoglobin 12.0 - 15.0 g/dL 13.9 13.4 13.0  Hematocrit 36.0 - 46.0 % 42.7 40.8 38.9  Platelets 150 - 400 K/uL 203 210 211    CMP Latest Ref Rng & Units 09/14/2020 08/24/2020 08/03/2020  Glucose 70 - 99 mg/dL 122(H) 109(H) 92  BUN 8 - 23 mg/dL 24(H) 25(H) 25(H)  Creatinine 0.44 - 1.00 mg/dL 0.80 0.73 0.80  Sodium 135 - 145 mmol/L 143 143 139  Potassium 3.5 - 5.1 mmol/L 4.2 3.9 4.2  Chloride 98 - 111 mmol/L 106 104 105  CO2 22 - 32 mmol/L 29 27 28   Calcium 8.9 - 10.3 mg/dL 9.7 9.3 9.8  Total Protein 6.5 - 8.1 g/dL 6.9 6.8 6.7  Total Bilirubin 0.3 - 1.2 mg/dL 0.6 0.4 0.5  Alkaline Phos 38 - 126 U/L 73 80 75  AST 15 - 41 U/L 20 18 16   ALT 0 - 44 U/L 13 15 12    . Lab Results  Component Value Date   LDH 287 (H) 09/14/2020    Component     Latest Ref Rng & Units 03/27/2019  Hepatitis B Surface Ag     NON REACTIVE NON REACTIVE  Hep B Core Total Ab     NON REACTIVE NON REACTIVE  HCV Ab     NON REACTIVE NON REACTIVE  LDH     98 - 192 U/L 252 (H)   . Lab Results  Component Value Date   LDH 287 (H) 09/14/2020      04/08/2019 NM PET Image Initial (PI) Skull Base To Thigh (Accession [id number])  04/04/2019 PATHOLOGY   04/07/2019 ECHOCARDIOGRAM  03/28/2019 PATHOLOGY   03/06/2019 CT ANGIOGRAPHY CHEST WITH CONTRAST (Accession [id number])  04/07/2019 ECHOCARDIOGRAM  03/06/2019 ECHOCARDIOGRAM     03/06/2019 PORTABLE CHEST 1 VIEW  (Accession [id number])    02/27/2019 CT CHEST, ABDOMEN, AND PELVIS WITH CONTRAST (Accession [id number])    02/17/2019 CHEST XRAY - 2 VIEW (Accession [id number])    RADIOGRAPHIC STUDIES: I have personally reviewed the radiological images as listed and agreed with the findings in the report. NM PET Image Restag (PS) Skull Base To Thigh  Result Date: 08/24/2020 CLINICAL DATA:  Subsequent treatment strategy for T-cell lymphoma. Angioimmuno blastic lymphoma EXAM: NUCLEAR MEDICINE PET SKULL BASE TO THIGH TECHNIQUE: 5.5 mCi F-18 FDG was injected intravenously. Full-ring PET imaging was performed from the skull base to thigh after the radiotracer. CT data was obtained and used for attenuation correction and anatomic localization. Fasting blood glucose: 83 mg/dl COMPARISON:  PET-CT [id number] FINDINGS: Mediastinal blood pool activity: SUV max 1.7 Liver activity: SUV max NA NECK: No abnormal hypermetabolic activity in the oropharynx hypopharynx. Abnormal radiotracer activity cervical lymph nodes. Physiologic activity noted in longus coli muscles Focal activity within the LEFT lobe of thyroid gland is unchanged. Incidental CT findings: none CHEST: No hypermetabolic or enlarged mediastinal Incidental CT findings: Nodule in the RIGHT lower lobe measuring 6 mm (image 46/8) is unchanged. No metabolic activity. Small nodule in RIGHT middle lobe measuring 5 mm also unchanged. ABDOMEN/PELVIS: No hypermetabolic abdominopelvic lymph nodes. No abnormal spleen or liver. Incidental CT findings: Large cyst of the LEFT kidney SKELETON: No aggressive osseous lesion. Incidental CT findings: none IMPRESSION:  1. No evidence of lymphoma recurrence on skull base to thigh FDG PET scan. 2. Stable RIGHT lung pulmonary nodules Electronically Signed   By: Suzy Bouchard M.D.   On: 08/24/2020 08:17     ASSESSMENT & PLAN:   MALETA PACHA is a 87 y.o. female with:  1. Relapsed/refractory Stage 4 Non Hodgkin Lymphoma  Angioimmunoblastic T-Cell Lymphoma. 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 SKIN, LEFT THIGH, BIOPSY: - Involvement by angioimmunoblastic T-cell lymphoma." -04/07/2019 Echocardiogram - normal EF -04/08/2019 NM PET Image Initial (PI) Skull Base To Thigh (Accession [id number]) revealed "1. Widespread hypermetabolic adenopathy in the neck, chest, and abdomen/pelvis, primarily in the Deauville 4 and Deauville 5 range. There is also hypermetabolic activity along the posterior nasopharynx in lingual tonsillar regions potentially indicating sites of involvement. 2. The subtle hypodensities in the spine and bony pelvis are not appreciably hypermetabolic may be incidental, surveillance is suggested. 3. Bilateral thyroid activity but especially on the left. Probably from thyroiditis. 4. A 6 mm subpleural nodule anteriorly in the right lower lobe not appreciably hypermetabolic but is below sensitive PET-CT size thresholds and merits surveillance. 5. Mildly accentuated diffuse splenic activity without splenomegaly or focal splenic lesion identified. 6. Other imaging findings of potential clinical significance: Aortic Atherosclerosis (ICD10-I70.0). Coronary atherosclerosis. Large right and small left pleural effusions. Moderate cardiomegaly. Small pericardial effusion. Mild mesenteric edema. Large left renal cyst. Fullness of both renal collecting systems with suspected nonobstructive left nephrolithiasis. Degenerative grade 1 anterolisthesis at L4-5 at L5-S1. Trace pelvic ascites." -05/28/2019 PET/CT ([id number]) revealed "Complete metabolic response to therapy". -09/16/2019 PET/CT ([id number]) revealed "1. Unfortunately evidence of lymphoma recurrence at sites of previous hypermetabolic adenopathy. Hypermetabolic lymphoid tissue is new from comparison PET-CT 05/28/2019 but at same locations as PET-CT 04/08/2019. The number of metastatic lymph  nodes is less than 04/08/2019. Lymphoid tissue at the RIGHT skull base is larger. 2. Three foci of hypermetabolic recurrence are present, lymphoid tissue at the RIGHT base of skull, hypermetabolic RIGHT hilar lymph node and hypermetabolic lymph node in the porta hepatis." -11/25/2019 PET/CT ([id number]) revealed "1. Marked interval improvement in the hypermetabolic disease identified previously in the posterior right nasopharynx, compatible with Deauville 4 today. 2. Decreased hypermetabolism in the right hilar and porta hepatis lymph nodes identified previously (Deauville 3). No new sites of hypermetabolic disease in the chest, abdomen, or pelvis. 3. Similar diffuse thyroid uptake, left greater than right. 4. No substantial change in right-sided pulmonary nodules." -04/09/2020 PET/CT ([id number]) revealed "No evidence progressive lymphoma."  2. Pulmonary Embolism -extensive likely related to extensive malignancy  3. H/o Breast Cancer  -The patient had bilateral diagnostic  mammography at [facility] 07/11/2011. This  showed some calcifications in the right  breast, which seemed a little bit more  prominent than prior. In the left breast  there was an area of possible  architectural distortion. However left  breast ultrasound the same day showed  no abnormality. 6 month followup was  suggested, and on 02/28/2012 the  patient again had bilateral diagnostic  mammography, now with right  ultrasonography. The microcalcifications  in the right breast appeared increased.  Ultrasound showed a hypoechoic lesion  measuring 5 mm, with no associated  shadowing. This had been previously  noted and appeared unchanged. Anterior  to that there was a small hypoechoic  mass measuring 4 mm in diameter.  There was felt to be suspicious, and on  03/14/2012 the patient underwent biopsy  of the right breast mass, showing (  [id number]  [id number]) ductal carcinoma in situ,  intermediate grade, estrogen receptor  100% and progesterone receptor 100%   positive.   -Bilateral breast MRI was obtained  03/26/2012. This showed only post  biopsy changes in the right breast,  associated with an area of non-masslike  enhancement measuring 2.4 cm. The  left breast was unremarkable, and there  was no enlarged axillary or internal  mammary adenopathy noted.  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 The patient's  subsequent history is as detailed below.   PLAN: -Discussed pt labwork today, 09/14/2020; blood counts all completely normal, chemistries stable. -Discussed Evusheld and pt's eligibility. Will send referral. Advised pt to wait around one month after this before getting the second booster shot. -Recommended pt receive the second COVID booster shot as recently approved. Advised pt to wait 4-6 months following first booster shot before getting this. -Recommended pt switch to high calorie high protein Boost instead of the lower calorie. -Discussed PML as a very rare potential side effect of chronic usage of Brentuximab. The pt notes no sudden changes. -Patient has no lab or clinical evidence of lymphoma progression/recurrence at this time. -The pt has no prohibitive toxicities from continuing next cycle of Brentuximab-Vedotin at this time. -Continue 2.5 Eliquis BID  -Will see back in 3 weeks with labs.   FOLLOW-UP: RTC w Dr Irene Limbo in 3 weeks with labs and next cycle of Brentuximab-Vedotin   The total time spent in the appointment was 30 minutes and more than 50% was on counseling and direct patient cares, Evusheld discussion, ordering and mx of chemotherapy.   All of the patient's questions were answered with apparent satisfaction. The patient knows to call the clinic with any problems, questions or concerns.    Sullivan Lone MD [place] AAHIVMS [facility] Hematology/Oncology  Physician [facility]  (Office):       [phone] (Work cell):  [phone] (Fax):           [phone]  09/14/2020 3:05 PM   I, Reinaldo Raddle, am acting as scribe for Dr. Sullivan Lone, MD.   .I have reviewed the above documentation for accuracy and completeness, and I agree with the above. Brunetta Genera MD

## 2020-09-14 ENCOUNTER — Inpatient Hospital Stay (HOSPITAL_BASED_OUTPATIENT_CLINIC_OR_DEPARTMENT_OTHER): Payer: Medicare Other | Admitting: Hematology

## 2020-09-14 ENCOUNTER — Inpatient Hospital Stay: Payer: Medicare Other

## 2020-09-14 ENCOUNTER — Other Ambulatory Visit: Payer: Self-pay

## 2020-09-14 ENCOUNTER — Inpatient Hospital Stay: Payer: Medicare Other | Admitting: Dietician

## 2020-09-14 VITALS — BP 122/75 | HR 74 | Temp 97.5°F | Resp 18 | Ht 66.0 in | Wt 109.1 lb

## 2020-09-14 DIAGNOSIS — C8440 Peripheral T-cell lymphoma, not classified, unspecified site: Secondary | ICD-10-CM | POA: Diagnosis not present

## 2020-09-14 DIAGNOSIS — Z5111 Encounter for antineoplastic chemotherapy: Secondary | ICD-10-CM

## 2020-09-14 DIAGNOSIS — C865 Angioimmunoblastic T-cell lymphoma: Secondary | ICD-10-CM | POA: Diagnosis not present

## 2020-09-14 DIAGNOSIS — Z5112 Encounter for antineoplastic immunotherapy: Secondary | ICD-10-CM | POA: Diagnosis not present

## 2020-09-14 DIAGNOSIS — Z853 Personal history of malignant neoplasm of breast: Secondary | ICD-10-CM | POA: Diagnosis not present

## 2020-09-14 DIAGNOSIS — I2699 Other pulmonary embolism without acute cor pulmonale: Secondary | ICD-10-CM | POA: Diagnosis not present

## 2020-09-14 DIAGNOSIS — Z7189 Other specified counseling: Secondary | ICD-10-CM

## 2020-09-14 DIAGNOSIS — Z95828 Presence of other vascular implants and grafts: Secondary | ICD-10-CM

## 2020-09-14 LAB — CBC WITH DIFFERENTIAL/PLATELET
Abs Immature Granulocytes: 0.01 10*3/uL (ref 0.00–0.07)
Basophils Absolute: 0 10*3/uL (ref 0.0–0.1)
Basophils Relative: 1 %
Eosinophils Absolute: 0 10*3/uL (ref 0.0–0.5)
Eosinophils Relative: 1 %
HCT: 42.7 % (ref 36.0–46.0)
Hemoglobin: 13.9 g/dL (ref 12.0–15.0)
Immature Granulocytes: 0 %
Lymphocytes Relative: 15 %
Lymphs Abs: 0.8 10*3/uL (ref 0.7–4.0)
MCH: 30 pg (ref 26.0–34.0)
MCHC: 32.6 g/dL (ref 30.0–36.0)
MCV: 92.2 fL (ref 80.0–100.0)
Monocytes Absolute: 0.5 10*3/uL (ref 0.1–1.0)
Monocytes Relative: 10 %
Neutro Abs: 3.8 10*3/uL (ref 1.7–7.7)
Neutrophils Relative %: 73 %
Platelets: 203 10*3/uL (ref 150–400)
RBC: 4.63 MIL/uL (ref 3.87–5.11)
RDW: 15.3 % (ref 11.5–15.5)
WBC: 5.1 10*3/uL (ref 4.0–10.5)
nRBC: 0 % (ref 0.0–0.2)

## 2020-09-14 LAB — CMP (CANCER CENTER ONLY)
ALT: 13 U/L (ref 0–44)
AST: 20 U/L (ref 15–41)
Albumin: 4.2 g/dL (ref 3.5–5.0)
Alkaline Phosphatase: 73 U/L (ref 38–126)
Anion gap: 8 (ref 5–15)
BUN: 24 mg/dL — ABNORMAL HIGH (ref 8–23)
CO2: 29 mmol/L (ref 22–32)
Calcium: 9.7 mg/dL (ref 8.9–10.3)
Chloride: 106 mmol/L (ref 98–111)
Creatinine: 0.8 mg/dL (ref 0.44–1.00)
GFR, Estimated: >60 mL/min (ref >60)
Glucose, Bld: 122 mg/dL — ABNORMAL HIGH (ref 70–99)
Potassium: 4.2 mmol/L (ref 3.5–5.1)
Sodium: 143 mmol/L (ref 135–145)
Total Bilirubin: 0.6 mg/dL (ref 0.3–1.2)
Total Protein: 6.9 g/dL (ref 6.5–8.1)

## 2020-09-14 LAB — LACTATE DEHYDROGENASE: LDH: 287 U/L — ABNORMAL HIGH (ref 98–192)

## 2020-09-14 MED ORDER — ACETAMINOPHEN 325 MG PO TABS
ORAL_TABLET | ORAL | Status: AC
  Filled 2020-09-14: qty 2

## 2020-09-14 MED ORDER — DEXAMETHASONE SODIUM PHOSPHATE 10 MG/ML IJ SOLN
INTRAMUSCULAR | Status: AC
  Filled 2020-09-14: qty 1

## 2020-09-14 MED ORDER — ONDANSETRON HCL 4 MG/2ML IJ SOLN
4.0000 mg | Freq: Once | INTRAMUSCULAR | Status: AC
  Administered 2020-09-14: 4 mg via INTRAVENOUS

## 2020-09-14 MED ORDER — ACETAMINOPHEN 325 MG PO TABS
650.0000 mg | ORAL_TABLET | Freq: Once | ORAL | Status: AC
  Administered 2020-09-14: 650 mg via ORAL

## 2020-09-14 MED ORDER — SODIUM CHLORIDE 0.9% FLUSH
10.0000 mL | Freq: Once | INTRAVENOUS | Status: AC
  Administered 2020-09-14: 10 mL
  Filled 2020-09-14: qty 10

## 2020-09-14 MED ORDER — DIPHENHYDRAMINE HCL 50 MG/ML IJ SOLN
25.0000 mg | Freq: Once | INTRAMUSCULAR | Status: AC
  Administered 2020-09-14: 25 mg via INTRAVENOUS

## 2020-09-14 MED ORDER — SODIUM CHLORIDE 0.9 % IV SOLN
Freq: Once | INTRAVENOUS | Status: AC
  Filled 2020-09-14: qty 250

## 2020-09-14 MED ORDER — SODIUM CHLORIDE 0.9% FLUSH
10.0000 mL | INTRAVENOUS | Status: DC | PRN
  Administered 2020-09-14: 10 mL
  Filled 2020-09-14: qty 10

## 2020-09-14 MED ORDER — DIPHENHYDRAMINE HCL 50 MG/ML IJ SOLN
INTRAMUSCULAR | Status: AC
  Filled 2020-09-14: qty 1

## 2020-09-14 MED ORDER — ONDANSETRON HCL 4 MG/2ML IJ SOLN
INTRAMUSCULAR | Status: AC
  Filled 2020-09-14: qty 2

## 2020-09-14 MED ORDER — SODIUM CHLORIDE 0.9 % IV SOLN
1.8000 mg/kg | Freq: Once | INTRAVENOUS | Status: AC
  Administered 2020-09-14: 100 mg via INTRAVENOUS
  Filled 2020-09-14: qty 20

## 2020-09-14 MED ORDER — FAMOTIDINE IN NACL 20-0.9 MG/50ML-% IV SOLN
INTRAVENOUS | Status: AC
  Filled 2020-09-14: qty 50

## 2020-09-14 MED ORDER — FAMOTIDINE IN NACL 20-0.9 MG/50ML-% IV SOLN
20.0000 mg | Freq: Once | INTRAVENOUS | Status: AC
  Administered 2020-09-14: 20 mg via INTRAVENOUS

## 2020-09-14 MED ORDER — DEXAMETHASONE SODIUM PHOSPHATE 10 MG/ML IJ SOLN
5.0000 mg | Freq: Once | INTRAMUSCULAR | Status: AC
Start: 2020-09-14 — End: 2020-09-14
  Administered 2020-09-14: 5 mg via INTRAVENOUS

## 2020-09-14 MED ORDER — HEPARIN SOD (PORK) LOCK FLUSH 100 UNIT/ML IV SOLN
500.0000 [IU] | Freq: Once | INTRAVENOUS | Status: AC | PRN
Start: 2020-09-14 — End: 2020-09-14
  Administered 2020-09-14: 500 [IU]
  Filled 2020-09-14: qty 5

## 2020-09-14 NOTE — Progress Notes (Signed)
Nutrition follow-up completed with patient in the infusion room receiving treatment for angioimmunoblastic lymphoma. Weight decreased by 3 pounds to 109 pounds on April 26 from 112 pounds April 5. Noted labs: Glucose 122 and BUN 24. Patient states she continues to try to eat a healthy diet.  She really enjoys oatmeal with blueberries and smoothies made with kale, spinach and bananas. She reports people keep telling her to eat more carbohydrates to gain weight. States she drinks 3 bottles of boost every day and says it has 30 g of protein per bottle.  She does not know how many calories is in these but seems to get carbohydrates and calories mixed up.  Nutrition diagnosis: Food and nutrition related knowledge deficit continues.  Intervention: Explained that protein, fat and carbohydrates are the macronutrients in food that provide overall calories for energy. Encourage patient to focus on total calories consumed and not just carbohydrate content. Explained importance of adequate calories in order to utilize protein for maintaining muscle mass and supporting healing. Recommend Ensure complete 3 times daily as oral nutrition supplement.  Provided a sample bottle and coupons. Educated patient on high-calorie, high-protein snacks and gave her a nutrition fact sheet. Also provided contact information.  Monitoring, evaluation, goals: Patient will tolerate increased calories and protein to minimize weight loss throughout treatment.  Next visit: Monday, May 16 during infusion.  **Disclaimer: This note was dictated with voice recognition software. Similar sounding words can inadvertently be transcribed and this note may contain transcription errors which may not have been corrected upon publication of note.**

## 2020-09-14 NOTE — Patient Instructions (Signed)
Implanted Port Insertion, Care After This sheet gives you information about how to care for yourself after your procedure. Your health care provider may also give you more specific instructions. If you have problems or questions, contact your health care provider. What can I expect after the procedure? After the procedure, it is common to have:  Discomfort at the port insertion site.  Bruising on the skin over the port. This should improve over 3-4 days. Follow these instructions at home: Port care  After your port is placed, you will get a manufacturer's information card. The card has information about your port. Keep this card with you at all times.  Take care of the port as told by your health care provider. Ask your health care provider if you or a family member can get training for taking care of the port at home. A home health care nurse may also take care of the port.  Make sure to remember what type of port you have. Incision care  Follow instructions from your health care provider about how to take care of your port insertion site. Make sure you: ? Wash your hands with soap and water before and after you change your bandage (dressing). If soap and water are not available, use hand sanitizer. ? Change your dressing as told by your health care provider. ? Leave stitches (sutures), skin glue, or adhesive strips in place. These skin closures may need to stay in place for 2 weeks or longer. If adhesive strip edges start to loosen and curl up, you may trim the loose edges. Do not remove adhesive strips completely unless your health care provider tells you to do that.  Check your port insertion site every day for signs of infection. Check for: ? Redness, swelling, or pain. ? Fluid or blood. ? Warmth. ? Pus or a bad smell.      Activity  Return to your normal activities as told by your health care provider. Ask your health care provider what activities are safe for you.  Do not  lift anything that is heavier than 10 lb (4.5 kg), or the limit that you are told, until your health care provider says that it is safe. General instructions  Take over-the-counter and prescription medicines only as told by your health care provider.  Do not take baths, swim, or use a hot tub until your health care provider approves. Ask your health care provider if you may take showers. You may only be allowed to take sponge baths.  Do not drive for 24 hours if you were given a sedative during your procedure.  Wear a medical alert bracelet in case of an emergency. This will tell any health care providers that you have a port.  Keep all follow-up visits as told by your health care provider. This is important. Contact a health care provider if:  You cannot flush your port with saline as directed, or you cannot draw blood from the port.  You have a fever or chills.  You have redness, swelling, or pain around your port insertion site.  You have fluid or blood coming from your port insertion site.  Your port insertion site feels warm to the touch.  You have pus or a bad smell coming from the port insertion site. Get help right away if:  You have chest pain or shortness of breath.  You have bleeding from your port that you cannot control. Summary  Take care of the port as told by your   health care provider. Keep the manufacturer's information card with you at all times.  Change your dressing as told by your health care provider.  Contact a health care provider if you have a fever or chills or if you have redness, swelling, or pain around your port insertion site.  Keep all follow-up visits as told by your health care provider. This information is not intended to replace advice given to you by your health care provider. Make sure you discuss any questions you have with your health care provider. Document Revised: 12/04/2017 Document Reviewed: 12/04/2017 Elsevier Patient Education   2021 Elsevier Inc.  

## 2020-09-14 NOTE — Progress Notes (Signed)
Calculated dose of Adcetris resulted @ 89.1 mg. Dose is right outside of 10% of previous doses of 100 mg. Will continue with 100 mg dose and will follow to see if weight changes persist for future doses.

## 2020-09-14 NOTE — Patient Instructions (Signed)
[facility]  Discharge Instructions: Thank you for choosing [facility] to provide your oncology and hematology care.   If you have a lab appointment with the [facility], please go directly to the [facility] and check in at the registration area.   Wear comfortable clothing and clothing appropriate for easy access to any Portacath or PICC line.   We strive to give you quality time with your provider. You may need to reschedule your appointment if you arrive late (15 or more minutes).  Arriving late affects you and other patients whose appointments are after yours.  Also, if you miss three or more appointments without notifying the office, you may be dismissed from the clinic at the provider's discretion.      For prescription refill requests, have your pharmacy contact our office and allow 72 hours for refills to be completed.    Today you received the following chemotherapy and/or immunotherapy agents brentuximab     To help prevent nausea and vomiting after your treatment, we encourage you to take your nausea medication as directed.  BELOW ARE SYMPTOMS THAT SHOULD BE REPORTED IMMEDIATELY: . *FEVER GREATER THAN 100.4 F (38 C) OR HIGHER . *CHILLS OR SWEATING . *NAUSEA AND VOMITING THAT IS NOT CONTROLLED WITH YOUR NAUSEA MEDICATION . *UNUSUAL SHORTNESS OF BREATH . *UNUSUAL BRUISING OR BLEEDING . *URINARY PROBLEMS (pain or burning when urinating, or frequent urination) . *BOWEL PROBLEMS (unusual diarrhea, constipation, pain near the anus) . TENDERNESS IN MOUTH AND THROAT WITH OR WITHOUT PRESENCE OF ULCERS (sore throat, sores in mouth, or a toothache) . UNUSUAL RASH, SWELLING OR PAIN  . UNUSUAL VAGINAL DISCHARGE OR ITCHING   Items with * indicate a potential emergency and should be followed up as soon as possible or go to the Emergency Department if any problems should occur.  Please show the CHEMOTHERAPY ALERT CARD or IMMUNOTHERAPY ALERT  CARD at check-in to the Emergency Department and triage nurse.  Should you have questions after your visit or need to cancel or reschedule your appointment, please contact [facility]  Dept: [phone]  and follow the prompts.  Office hours are 8:00 a.m. to 4:30 p.m. Monday - Friday. Please note that voicemails left after 4:00 p.m. may not be returned until the following business day.  We are closed weekends and major holidays. You have access to a nurse at all times for urgent questions. Please call the main number to the clinic Dept: [phone] and follow the prompts.   For any non-urgent questions, you may also contact your provider using MyChart. We now offer e-Visits for anyone 38 and older to request care online for non-urgent symptoms. For details visit [email or web address].   Also download the MyChart app! Go to the app store, search "MyChart", open the app, select [facility], and log in with your MyChart username and password.  Due to Covid, a mask is required upon entering the hospital/clinic. If you do not have a mask, one will be given to you upon arrival. For doctor visits, patients may have 1 support person aged 2 or older with them. For treatment visits, patients cannot have anyone with them due to current Covid guidelines and our immunocompromised population.

## 2020-09-21 ENCOUNTER — Telehealth: Payer: Self-pay

## 2020-09-21 NOTE — Telephone Encounter (Signed)
Returned call to Pt's daughter. Daughter states pt "slid down" on the floor Friday pm and was unable to get up. Pt stated her feet "felt like glass" and was unable to manage to get up unassisted. Pt also had an episode over the weekend where her foot went "numb" and "slid off the gas". Daughter wanted to make Dr Irene Limbo aware. Pt has refused to take gabapentin in the past due to the side effects she read about. Will discuss with Dr Irene Limbo. Encouraged daughter to also make PCP aware . Pt has an upcoming appointment with dr Irene Limbo and with PCP. Will contact daughter with any updates if needed.

## 2020-09-22 ENCOUNTER — Telehealth: Payer: Self-pay | Admitting: Adult Health

## 2020-09-22 DIAGNOSIS — Z1231 Encounter for screening mammogram for malignant neoplasm of breast: Secondary | ICD-10-CM | POA: Diagnosis not present

## 2020-09-22 NOTE — Telephone Encounter (Signed)
I called patient to discuss Evusheld, a long acting monoclonal antibody injection administered to patients with decreased immune systems or intolerance/allergy to the COVID 19 vaccine as COVID19 prevention.    Spoke with patient daughter [name], and patient has opted to forego Evusheld.  She will call back if she changes her mind.    Wilber Bihari, NP

## 2020-09-29 ENCOUNTER — Telehealth: Payer: Self-pay

## 2020-09-29 DIAGNOSIS — C8440 Peripheral T-cell lymphoma, not classified, unspecified site: Secondary | ICD-10-CM

## 2020-09-29 DIAGNOSIS — Z23 Encounter for immunization: Secondary | ICD-10-CM | POA: Diagnosis not present

## 2020-09-29 MED ORDER — GABAPENTIN 100 MG PO CAPS: 200.0000 mg | ORAL_CAPSULE | Freq: Every day | ORAL | 2 refills | Status: DC

## 2020-09-29 NOTE — Telephone Encounter (Signed)
Returned call to daughter. Daughter stated pt still having issues with numbness in her feet. Pt had a fall recently and they have decided to give the gabapentin a try again. Refill sent in for gabapentin. Encouraged/advised daughter to have pt take at night and that it will make pt sleepy. Daughter verbalized understanding.

## 2020-10-02 NOTE — Progress Notes (Signed)
HEMATOLOGY/ONCOLOGY CLINIC NOTE  Date of Service: 10/04/2020  Patient Care Team: Lajean Manes, MD as PCP - General (Internal Medicine) Buford Dresser, MD as PCP - Cardiology (Cardiology)  REFERRING PHYSICIAN: Lajean Manes, MD  CHIEF COMPLAINTS/PURPOSE OF CONSULTATION:   Continue mx of Recently Diagnosed Angioimmunoblastic T cell lymphoma     HISTORY OF PRESENTING ILLNESS:   Sarah [name] is a wonderful 87 y.o. female who has been referred to [place] by Lajean Manes, MD for evaluation and management of Possible lymphoma . She is accompanied today by [name] . The pt reports that she is doing well overall.   Pending Surgical pathology from 03/17/2019 Dr.Manny will contact clinic about pathology   August 25th/27th she began coughing and sneezing. On Sept 3 went to doctors and they said it was allergies. She began having rashes (little red dots). Went to PCP and they prescribed prednisone and for several days and the medication helped. But as soon as she completed the prescription symptoms reoccurred.   She had a chest X Ray done because she was having a severe cough on 02/17/2019 revealing "bilateral effusions in this patient with history of breast cancer,of uncertain significance with question of right lower lobe pulmonary nodule, consider chest CT for further assessment. Atherosclerotic changes in the thoracic aorta."  Dr. Amy Martinique biopsied the rashes on her back 02/21/2019 and it was determined that it could be because of an allergy to medication. Dr.Stoneking stopped her blood pressure medication from September- October but ther rash still present. The itching started 1 to 2 months ago. The rash would come and go and was mostly on her back. She still has rash on her left thigh.  She is using triaconazole and cetrizine.  She has lumps behind ear and abdomin and she states that they have swollen twice the size within 1 and half months  She has no appetite and  has changes in her taste that began around August. She has changes in bowels. They are more loose and smaller In size. She also has no control over bowels.  She feels fatigued and it has increased since August.   She has a cough that starts in September  that has made her hoarse.   She had a CT scan on 02/27/2019 because she was having severe cough and her stomach was very extended. Revealing "1. Extensive lymphadenopathy throughout the chest, abdomen, and pelvis, with index lymph nodes identified above. Splenomegaly. Findings are most consistent with lymphoma with recurrent, metastatic breast malignancy less favored. 2. There is generally osteopenic appearance of the skeleton. There are numerous subtle hypodense lesions, particularly of the pelvis (e.g. Series 2, image 88) and select vertebral bodies, for example L4 (series 8, image 92). This is suspicious although not definitive for osseous metastatic disease. Characterization for metabolic activity by nuclear scintigraphic bone scan or PET-CT would be helpful to further evaluate. 3. Moderate right, small left pleural effusions with associated atelectasis or consolidation. Subpleural radiation fibrosis of the anterior right lung. 4. There is a new subpleural pulmonary nodule of the anterior right middle lobe measuring 6 mm (series 4, image 105), nonspecific. There is no obvious pleural thickening or nodularity definitive for metastatic disease. 5.  Small volume ascites. 6. There is a fluid attenuation lesion of the pancreatic uncinate measuring 2.4 x 1.3 cm (series 2, image 63), not substantially changed when compared to remote prior MR examination dated 02/23/2012 and likely sequelae of a prior pseudocyst versus incidental pancreatic IPMN. 7.  Cardiomegaly."  Hospitalized from 03/06/2019-03/08/2019. She presenting with acute shortness of breath, leg swelling,left hand swelling and some weight gain despite poor appetite. In ED, CTA chest revealed at least  submassive PE, moderate right pleural effusion, diffuse LAD concerning for lymphoma. Started on heparin drip. PCCM consulted for guidance. She had thoracocentesis with removal of 1200 cc cloudy exudative pleural fluid. Cardiology consulted for elevated which was thought to be demand ischemia from right heart strain in the setting of PE. Patient was transitioned to subcu Lovenox by pulmonology the next day and remained stable. Unusual appearance of tracheal air column at the thoracic inlet. Correlate with any history of swallowing difficulty or changes in voice with dedicated imaging with chest CT and or CT of the neck as indicated.  She has a nonhealing ulcer on her right tibialis anterior that starting weeping one week ago. Of note since the patient's last visit, pt has had CHEST XRAY - 2 VIEW (Accession [id number]) completed on 02/17/2019 with results revealing "Bilateral effusions in this patient with history of breast cancer,of uncertain significance with question of right lower lobepulmonary nodule, consider chest CT for furtherassessment.Unusual appearance of tracheal air column at the thoracic inlet.Correlate with any history of swallowing difficulty or changes in voice with dedicated imaging with chest CT and or CT of the neck as Indicated. Atherosclerotic changes in the thoracic aorta."  Of note since the patient's last visit, pt has had CT CHEST, ABDOMEN, AND PELVIS WITH CONTRAST (Accession [id number]) completed on 02/27/2019 with results revealing "1. Extensive lymphadenopathy throughout the chest, abdomen, and pelvis, with index lymph nodes identified above. Splenomegaly.Findings are most consistent with lymphoma with recurrent, metastatic breast malignancy less favored. 2. There is generally osteopenic appearance of the skeleton. There are numerous subtle hypodense lesions, particularly of the pelvis (e.g. Series 2, image 88) and select vertebral bodies, for example L4 (series 8, image 92).  This is suspicious although not definitive for osseous metastatic disease. Characterization for metabolic activity by nuclear scintigraphic bone scan or PET-CT would be helpful to further evaluate.  3. Moderate right, small left pleural effusions with associated atelectasis or consolidation. Subpleural radiation fibrosis of the anterior right lung.4. There is a new subpleural pulmonary nodule of the anterior right middle lobe measuring 6 mm (series 4, image 105), nonspecific. There is no obvious pleural thickening or nodularity definitive for metastatic disease.5.  Small volume ascites. 6. There is a fluid attenuation lesion of the pancreatic uncinate measuring 2.4 x 1.3 cm (series 2, image 63), not substantially changed when compared to remote prior MR examination dated 02/23/2012 and likely sequelae of a prior pseudocyst versus incidental pancreatic IPMN.7.  Cardiomegaly.8. Other chronic and incidental findings as detailed above. Aortic Atherosclerosis (ICD10-I70.0)."  Of note since the patient's last visit, pt has had PORTABLE CHEST 1 VIEW (Accession [id number]) completed on 03/06/2019 with results revealing "1. Moderate size right and small left pleural effusions appear not significantly changed since 02/27/19. 2. No new cardiopulmonary abnormality."  Of note since the patient's last visit, pt has had ECHOCARDIOGRAM  completed on 03/06/2019 with results revealing  "1. Left ventricular ejection fraction, by visual estimation, is 60 to 65%. The left ventricle has normal function. Normal left ventricular size. There is no left ventricular hypertrophy. 2. Left ventricular diastolic Doppler parameters are consistent with impaired relaxation pattern of LV diastolic filling. 3. Global right ventricle has mildly reduced systolic function.The right ventricular size is moderately enlarged. No increase in right ventricular wall thickness. 4. Left atrial size was normal.  5. Right atrial size was mildly dilated.  6. Trivial pericardial effusion is present. 7. The mitral valve is normal in structure. Trace mitral valve regurgitation. No evidence of mitral stenosis. 8. The tricuspid valve is normal in structure. Tricuspid valve regurgitation severe. 9. The aortic valve is normal in structure. Aortic valve regurgitation was not visualized by color flow Doppler. Structurally normal aortic valve, with no evidence of sclerosis or stenosis.10. The pulmonic valve was normal in structure. Pulmonic valve regurgitation is mild by color flow Doppler.11. Mildly elevated pulmonary artery systolic pressure.12. The inferior vena cava is normal in size with greater than 50% respiratory variability, suggesting right atrial pressure of 3 mmHg."  Of note since the patient's last visit, pt has had CT ANGIOGRAPHY CHEST WITH CONTRAST (Accession [id number]) completed on 03/06/2019 with results revealing "1. Pulmonary embolus arising from the distal left main pulmonary artery with extension into multiple left lower lobe pulmonary arterial branches. There is also incompletely obstructing pulmonary embolus in the proximal left upper lobe pulmonary artery. More distal pulmonary embolus in the right lower lobe pulmonary artery. Positive for acute PE with CT evidence of right heart strain (RV/LV Ratio = 1.6) consistent with at least submassive (intermediate risk) PE. The presence of right heart strain has been associated with an increased risk of morbidity and mortality. Please activate Code PE by paging [phone]. 2. Sizable pleural effusion on the right with smaller pleural effusion on the left. Consolidation and compressive atelectasis throughout most of the right lower lobe. Milder atelectasis left base. Nodular opacities on the right, likely metastatic foci.3.  Stable adenopathy compared to 1 week prior.4. Enlargement of spleen, incompletely visualized but documented CT 1 week prior. Concern for potential lymphoma. 5. Aortic atherosclerosis.  No aneurysm or dissection evident. Foci of coronary artery calcification noted."  Most recent lab results (03/06/2019) of CBC is as follows: all values are WNL except for Platelets at 72, Eosinophils Absolute at 0.6,Abs Immature Granulocytes at 0.08 .  On review of systems, pt reports rashes, digestive issues and denies back pain, abdominal pain and any other symptoms.   On PMHx the pt reports Hypothyroidism, MCI, Hypercholesterolemia, Tracheal anomaly, Hypertension, Pulmonary nodule, Atherosclerosis of aorta, anxiety, gait disorder, primary insomnia.   INTERVAL HISTORY:  Sarah [name] is a 87 y.o. female here for evaluation and management of Angioimmunoblastic T-cell lymphoma. The patient's last visit with [place] was on 09/14/2020. The pt reports that she is doing well overall. We are joined today by her daughter.  The pt reports that for the last two weeks it feels as though she has been "walking on leather". The bottom of her feet feel very hard and numb, but she denies any tingling. The pt notes this is the whole bottom of her foot and notes no tingling or numbness in her hands. The pt notes the only spot is the bottom of her feet and not just her toes. The pt notes she did have a fall recently. The pt notes that she has had some anxiety and depression that requires medication. The pt notes that this depression was worsened by Gabapentin she started. The pt takes one Ambien and one Neurontin at night. She uses Ativan prn for anxiety.  The pt notes some depression with using a cane or walker. She notes that she feels clumsy using one and it is a hurdle to get over. 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  Lab results today 10/04/2020 of CBC w/diff and CMP is as follows:  all values are WNL except for WBC of 3.3K. 10/04/2020 LDH pending.  On review of systems, pt reports early grade 1 neuropathy, imbalance, depression, anxiety and denies acute rashes, fevers, new lumps/bumps, and any  other symptoms.  MEDICAL HISTORY:  Past Medical History:  Diagnosis Date  . Allergy    codeine, thiazides  . Anxiety    new dx  . Arthritis   . Breast cancer ([place]) 03/14/12   bx=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,ER/PR=+,upper inner quad  . Cancer ([place])    breast  . Cataract   . DVT (deep venous thrombosis) ([place]) 02/2019   left leg  . Dyspnea   . Edema    both legs feet and toe, abdomen  . Glaucoma    laser treated years ago  . HOH (hard of hearing)   . Hyperlipemia   . Hypertension   . Hypothyroidism   . Pancreatic cyst    benign  . PONV (postoperative nausea and vomiting)   . Pulmonary embolism ([place]) 02/2019   bilateral   . Radiation 06/11/2012-07/12/2012   17 sessions 4250 cGy, 3 sessions 750 cGy  . Vertigo   . Wears glasses   . Wears partial dentures    partial upper     SURGICAL HISTORY: Past Surgical History:  Procedure Laterality Date  . ABDOMINAL HYSTERECTOMY  1966   1/2 ovary left in   . BREAST SURGERY  1992   lumpectomy-lt  . CATARACT EXTRACTION     b/l  . COLONOSCOPY    . EXCISION MASS NECK Left 03/17/2019    EXCISION MASS NECK (Left Neck)  . EXCISION MASS NECK Left 03/17/2019   Procedure: EXCISION MASS NECK;  Surgeon: Helayne Seminole, MD;  Location: [facility];  Service: ENT;  Laterality: Left;  . EYE SURGERY Bilateral    bilateral cataract removal  . IR IMAGING GUIDED PORT INSERTION  04/23/2019  . JOINT REPLACEMENT  2013   rt total knee  . JOINT REPLACEMENT  1995   lt total knee  . PARTIAL MASTECTOMY WITH NEEDLE LOCALIZATION  04/09/2012   Procedure: PARTIAL MASTECTOMY WITH NEEDLE LOCALIZATION;  Surgeon: Adin Hector, MD;  Location: [facility];  Service: General;  Laterality: Right;  . SKIN BIOPSY Left 03/17/2019   LEFT THIGH  . SKIN BIOPSY Left 03/17/2019   Procedure: Skin Biopsy Left Thigh;  Surgeon: Helayne Seminole, MD;  Location: [facility];  Service: ENT;  Laterality: Left;  . TONSILLECTOMY    .  TOTAL KNEE ARTHROPLASTY  08/16/2011   Procedure: TOTAL KNEE ARTHROPLASTY;  Surgeon: Ninetta Lights, MD;  Location: [facility];  Service: Orthopedics;  Laterality: Right;     SOCIAL HISTORY: Social History   Socioeconomic History  . Marital status: Widowed    Spouse name: Not on file  . Number of children: 2  . Years of education: Not on file  . Highest education level: Not on file  Occupational History  . Occupation: Retired    Comment: Community education officer   Tobacco Use  . Smoking status: Never Smoker  . Smokeless tobacco: Never Used  Vaping Use  . Vaping Use: Never used  Substance and Sexual Activity  . Alcohol use: No  . Drug use: No  . Sexual activity: Not Currently  Other Topics Concern  . Not on file  Social History Narrative  . Not on file   Social Determinants of Health   Financial Resource Strain: Not on file  Food Insecurity: Not on file  Transportation Needs: Not on file  Physical Activity: Not on file  Stress: Not on file  Social Connections: Not on file  Intimate Partner Violence: Not on file     FAMILY HISTORY: Family History  Problem Relation Age of Onset  . Heart disease Father   . Cancer Maternal Aunt        stomach     ALLERGIES:   is allergic to citalopram, alendronate sodium, amoxicillin-pot clavulanate, codeine, latex, lisinopril, septra [sulfamethoxazole-trimethoprim], and thiazide-type diuretics.   MEDICATIONS:  Current Outpatient Medications  Medication Sig Dispense Refill  . amLODipine (NORVASC) 2.5 MG tablet Take 2.5-5 mg by mouth See admin instructions. 5 mg in the morning, 2.5 mg in the evening    . amoxicillin (AMOXIL) 500 MG capsule Take 4 capsules (2,000 mg total) by mouth once as needed for up to 1 dose. 60 mins prior to dental procedure (Patient not taking: Reported on 06/23/2020) 4 capsule 0  . apixaban (ELIQUIS) 2.5 MG TABS tablet Take 1 tablet (2.5 mg total) by mouth 2 (two) times daily. 60 tablet 5  . benzonatate (TESSALON) 100 MG  capsule Take 1 capsule (100 mg total) by mouth 3 (three) times daily as needed for cough. 30 capsule 0  . Cholecalciferol (VITAMIN D) 50 MCG (2000 UT) tablet Take 2,000 Units by mouth 2 (two) times daily.    . clonazepam (KLONOPIN) 0.125 MG disintegrating tablet Take 1 tablet (0.125 mg total) by mouth 2 (two) times daily. 60 tablet 0  . Cyanocobalamin (VITAMIN B-12 PO) Take 3,000 mcg by mouth daily.     Marland Kitchen gabapentin (NEURONTIN) 100 MG capsule Take 2 capsules (200 mg total) by mouth at bedtime. 60 capsule 2  . levothyroxine (SYNTHROID) 25 MCG tablet Take 25 mcg by mouth daily before breakfast.     . lidocaine-prilocaine (EMLA) cream Apply to affected area once 30 g 3  . LORazepam (ATIVAN) 0.5 MG tablet TAKE 1/2 (ONE-HALF) TO 1 TABLET BY MOUTH EVERY 8 HOURS 30 tablet 0  . Multiple Vitamins-Minerals (PRESERVISION AREDS PO) Take 1 capsule by mouth 2 (two) times daily.     . ondansetron (ZOFRAN) 8 MG tablet Take 1 tablet (8 mg total) by mouth 2 (two) times daily as needed. Start on the third day after chemotherapy. 30 tablet 1  . polyethylene glycol (MIRALAX / GLYCOLAX) 17 g packet Take 17 g by mouth daily.    . prochlorperazine (COMPAZINE) 10 MG tablet Take 1 tablet (10 mg total) by mouth every 6 (six) hours as needed (Nausea or vomiting). (Patient not taking: Reported on 06/23/2020) 30 tablet 1  . sodium chloride (MURO 128) 5 % ophthalmic ointment Place 1 application into both eyes at bedtime.    Marland Kitchen XIIDRA 5 % SOLN Place 1 drop into both eyes daily as needed (dry eyes).     Marland Kitchen zolpidem (AMBIEN) 5 MG tablet Take 5 mg by mouth at bedtime as needed for sleep.      No current facility-administered medications for this visit.     REVIEW OF SYSTEMS:   10 Point review of Systems was done is negative except as noted above.  PHYSICAL EXAMINATION: ECOG FS:1 - Symptomatic but completely ambulatory  Vitals:   10/04/20 0948  BP: 136/73  Pulse: 73  Resp: 13  Temp: (!) 96.8 F (36 C)  SpO2: 98%   Wt  Readings from Last 3 Encounters:  10/04/20 109 lb 11.2 oz (49.8 kg)  09/14/20 109 lb 1.6 oz (49.5 kg)  08/24/20 112 lb  14.4 oz (51.2 kg)   Body mass index is 17.71 kg/m.    Exam was given in a chair.  GENERAL:alert, in no acute distress and comfortable SKIN: no acute rashes, no significant lesions EYES: conjunctiva are pink and non-injected, sclera anicteric OROPHARYNX: MMM, no exudates, no oropharyngeal erythema or ulceration NECK: supple, no JVD LYMPH:  no palpable lymphadenopathy in the cervical, axillary or inguinal regions LUNGS: clear to auscultation b/l with normal respiratory effort HEART: regular rate & rhythm ABDOMEN:  normoactive bowel sounds , non tender, not distended. Extremity: no pedal edema PSYCH: alert & oriented x 3 with fluent speech NEURO: no focal motor/sensory deficits   LABORATORY DATA:  I have reviewed the data as listed  CBC Latest Ref Rng & Units 10/04/2020 09/14/2020 08/24/2020  WBC 4.0 - 10.5 K/uL 3.3(L) 5.1 4.6  Hemoglobin 12.0 - 15.0 g/dL 13.7 13.9 13.4  Hematocrit 36.0 - 46.0 % 40.7 42.7 40.8  Platelets 150 - 400 K/uL 184 203 210    CMP Latest Ref Rng & Units 10/04/2020 09/14/2020 08/24/2020  Glucose 70 - 99 mg/dL 84 122(H) 109(H)  BUN 8 - 23 mg/dL 21 24(H) 25(H)  Creatinine 0.44 - 1.00 mg/dL 0.65 0.80 0.73  Sodium 135 - 145 mmol/L 142 143 143  Potassium 3.5 - 5.1 mmol/L 3.9 4.2 3.9  Chloride 98 - 111 mmol/L 106 106 104  CO2 22 - 32 mmol/L 29 29 27   Calcium 8.9 - 10.3 mg/dL 9.7 9.7 9.3  Total Protein 6.5 - 8.1 g/dL 6.6 6.9 6.8  Total Bilirubin 0.3 - 1.2 mg/dL 0.5 0.6 0.4  Alkaline Phos 38 - 126 U/L 70 73 80  AST 15 - 41 U/L 20 20 18   ALT 0 - 44 U/L 14 13 15    . Lab Results  Component Value Date   LDH 287 (H) 09/14/2020    Component     Latest Ref Rng & Units 03/27/2019  Hepatitis B Surface Ag     NON REACTIVE NON REACTIVE  Hep B Core Total Ab     NON REACTIVE NON REACTIVE  HCV Ab     NON REACTIVE NON REACTIVE  LDH     98 - 192 U/L  252 (H)   . Lab Results  Component Value Date   LDH 287 (H) 09/14/2020      04/08/2019 NM PET Image Initial (PI) Skull Base To Thigh (Accession [id number])  04/04/2019 PATHOLOGY   04/07/2019 ECHOCARDIOGRAM  03/28/2019 PATHOLOGY   03/06/2019 CT ANGIOGRAPHY CHEST WITH CONTRAST (Accession [id number])  04/07/2019 ECHOCARDIOGRAM  03/06/2019 ECHOCARDIOGRAM     03/06/2019 PORTABLE CHEST 1 VIEW (Accession [id number])    02/27/2019 CT CHEST, ABDOMEN, AND PELVIS WITH CONTRAST (Accession [id number])    02/17/2019 CHEST XRAY - 2 VIEW (Accession [id number])    RADIOGRAPHIC STUDIES: I have personally reviewed the radiological images as listed and agreed with the findings in the report. No results found.   ASSESSMENT & PLAN:   SABRIYA YONO is a 87 y.o. female with:  1. 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 SKIN, LEFT THIGH, BIOPSY: - Involvement by angioimmunoblastic T-cell lymphoma." -04/07/2019 Echocardiogram - normal EF -04/08/2019 NM PET Image Initial (PI) Skull Base To Thigh (Accession [id number]) revealed "1. Widespread hypermetabolic adenopathy in the neck, chest, and abdomen/pelvis, primarily in the Deauville 4 and Deauville 5 range. There is also hypermetabolic activity along the posterior nasopharynx in lingual tonsillar regions  potentially indicating sites of involvement. 2. The subtle hypodensities in the spine and bony pelvis are not appreciably hypermetabolic may be incidental, surveillance is suggested. 3. Bilateral thyroid activity but especially on the left. Probably from thyroiditis. 4. A 6 mm subpleural nodule anteriorly in the right lower lobe not appreciably hypermetabolic but is below sensitive PET-CT size thresholds and merits surveillance. 5. Mildly accentuated diffuse splenic activity without  splenomegaly or focal splenic lesion identified. 6. Other imaging findings of potential clinical significance: Aortic Atherosclerosis (ICD10-I70.0). Coronary atherosclerosis. Large right and small left pleural effusions. Moderate cardiomegaly. Small pericardial effusion. Mild mesenteric edema. Large left renal cyst. Fullness of both renal collecting systems with suspected nonobstructive left nephrolithiasis. Degenerative grade 1 anterolisthesis at L4-5 at L5-S1. Trace pelvic ascites." -05/28/2019 PET/CT ([id number]) revealed "Complete metabolic response to therapy". -09/16/2019 PET/CT ([id number]) revealed "1. Unfortunately evidence of lymphoma recurrence at sites of previous hypermetabolic adenopathy. Hypermetabolic lymphoid tissue is new from comparison PET-CT 05/28/2019 but at same locations as PET-CT 04/08/2019. The number of metastatic lymph nodes is less than 04/08/2019. Lymphoid tissue at the RIGHT skull base is larger. 2. Three foci of hypermetabolic recurrence are present, lymphoid tissue at the RIGHT base of skull, hypermetabolic RIGHT hilar lymph node and hypermetabolic lymph node in the porta hepatis." -11/25/2019 PET/CT ([id number]) revealed "1. Marked interval improvement in the hypermetabolic disease identified previously in the posterior right nasopharynx, compatible with Deauville 4 today. 2. Decreased hypermetabolism in the right hilar and porta hepatis lymph nodes identified previously (Deauville 3). No new sites of hypermetabolic disease in the chest, abdomen, or pelvis. 3. Similar diffuse thyroid uptake, left greater than right. 4. No substantial change in right-sided pulmonary nodules." -04/09/2020 PET/CT ([id number]) revealed "No evidence progressive lymphoma."  2. Pulmonary Embolism -extensive likely related to extensive malignancy  3. H/o Breast Cancer  -The patient had bilateral diagnostic  mammography at [facility] 07/11/2011. This  showed some calcifications in the right  breast,  which seemed a little bit more  prominent than prior. In the left breast  there was an area of possible  architectural distortion. However left  breast ultrasound the same day showed  no abnormality. 6 month followup was  suggested, and on 02/28/2012 the  patient again had bilateral diagnostic  mammography, now with right  ultrasonography. The microcalcifications  in the right breast appeared increased.  Ultrasound showed a hypoechoic lesion  measuring 5 mm, with no associated  shadowing. This had been previously  noted and appeared unchanged. Anterior  to that there was a small hypoechoic  mass measuring 4 mm in diameter.  There was felt to be suspicious, and on  03/14/2012 the patient underwent biopsy  of the right breast mass, showing (SAA  [phone]) ductal carcinoma in situ,  intermediate grade, estrogen receptor  100% and progesterone receptor 100%  positive.   -Bilateral breast MRI was obtained  03/26/2012. This showed only post  biopsy changes in the right breast,  associated with an area of non-masslike  enhancement measuring 2.4 cm. The  left breast was unremarkable, and there  was no enlarged axillary or internal  mammary adenopathy noted.  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 The patient's  subsequent history is as detailed below.   PLAN: -Discussed pt labwork today, 10/04/2020; blood counts and chemistries normal. -Advised pt that she needs to use a  cane unequivocally due to neuropathy of feet. This will reduce risks of becoming bedbound due to falls. -Advised pt not to drive at this time due to neuropathy in feet. -Advised pt neuropathy is usually reversible if we catch it early and stop the treatment. -Advised pt that any sedatives and neuropathy can both attribute to increased risks of falls. This is combined with age as  well. -Will send referral to Outpatient PT in addition to using cane and to improve her balance and functional status. They cna also help determine which type of cane the pt needs. -Patient has no lab or clinical evidence of lymphoma progression/recurrence at this time. -Advised pt the neuropathy is most likely a toxicity from the treatment and not from the disease itself. -Will hold Neurontin since causing depression and neuropathy not painful at this time. -Will hold treatment today due to prohibitive toxicity of early grade 1 neuropathy. Will reassess in 3-4 weeks. -Will wait for referral to behavior counselor regarding anxiety and depression after holding some medications. -Continue 2.5 Eliquis BID  -Recommended pt take Vitamin B Complex daily. -Will see back in 3 weeks by Murray Hodgkins for evaluation of neuropathy and starting treatment on lower dose (1.2 mg/kg of Brentuximab). -Will see back in 6 weeks with labs.   FOLLOW-UP: Holding [facility] today Plz schedule next treatment with portflush and labs and f/u with Murray Hodgkins in 3 weeks Plz schedule subsequent treatment with portflush and labs and f/u with Dr Irene Limbo in 6 weeks Referral to cancer rehab   The total time spent in the appointment was 30 minutes and more than 50% was on counseling and direct patient cares.    All of the patient's questions were answered with apparent satisfaction. The patient knows to call the clinic with any problems, questions or concerns.    Sullivan Lone MD [place] AAHIVMS [facility] Hematology/Oncology Physician [facility]  (Office):       [phone] (Work cell):  [phone] (Fax):           [phone]  10/04/2020 10:56 AM   I, Reinaldo Raddle, am acting as scribe for Dr. Sullivan Lone, MD.   .I have reviewed the above documentation for accuracy and completeness, and I agree with the above. Brunetta Genera MD

## 2020-10-04 ENCOUNTER — Other Ambulatory Visit: Payer: Medicare Other

## 2020-10-04 ENCOUNTER — Inpatient Hospital Stay: Payer: Medicare Other | Admitting: Nutrition

## 2020-10-04 ENCOUNTER — Inpatient Hospital Stay: Payer: Medicare Other | Attending: Hematology | Admitting: Hematology

## 2020-10-04 ENCOUNTER — Inpatient Hospital Stay: Payer: Medicare Other

## 2020-10-04 ENCOUNTER — Encounter: Payer: Self-pay | Admitting: Nutrition

## 2020-10-04 ENCOUNTER — Other Ambulatory Visit: Payer: Self-pay

## 2020-10-04 VITALS — BP 136/73 | HR 73 | Temp 96.8°F | Resp 13 | Ht 66.0 in | Wt 109.7 lb

## 2020-10-04 DIAGNOSIS — Z86718 Personal history of other venous thrombosis and embolism: Secondary | ICD-10-CM | POA: Diagnosis not present

## 2020-10-04 DIAGNOSIS — I10 Essential (primary) hypertension: Secondary | ICD-10-CM | POA: Diagnosis not present

## 2020-10-04 DIAGNOSIS — I700 Atherosclerosis of aorta: Secondary | ICD-10-CM | POA: Diagnosis not present

## 2020-10-04 DIAGNOSIS — Z853 Personal history of malignant neoplasm of breast: Secondary | ICD-10-CM | POA: Insufficient documentation

## 2020-10-04 DIAGNOSIS — C8440 Peripheral T-cell lymphoma, not classified, unspecified site: Secondary | ICD-10-CM

## 2020-10-04 DIAGNOSIS — Z79899 Other long term (current) drug therapy: Secondary | ICD-10-CM | POA: Insufficient documentation

## 2020-10-04 DIAGNOSIS — E039 Hypothyroidism, unspecified: Secondary | ICD-10-CM | POA: Diagnosis not present

## 2020-10-04 DIAGNOSIS — C865 Angioimmunoblastic T-cell lymphoma: Secondary | ICD-10-CM | POA: Diagnosis not present

## 2020-10-04 DIAGNOSIS — Z923 Personal history of irradiation: Secondary | ICD-10-CM | POA: Diagnosis not present

## 2020-10-04 DIAGNOSIS — Z86711 Personal history of pulmonary embolism: Secondary | ICD-10-CM | POA: Diagnosis not present

## 2020-10-04 DIAGNOSIS — E7800 Pure hypercholesterolemia, unspecified: Secondary | ICD-10-CM | POA: Diagnosis not present

## 2020-10-04 DIAGNOSIS — Z5111 Encounter for antineoplastic chemotherapy: Secondary | ICD-10-CM

## 2020-10-04 DIAGNOSIS — G629 Polyneuropathy, unspecified: Secondary | ICD-10-CM

## 2020-10-04 DIAGNOSIS — Z95828 Presence of other vascular implants and grafts: Secondary | ICD-10-CM

## 2020-10-04 LAB — CBC WITH DIFFERENTIAL/PLATELET
Abs Immature Granulocytes: 0.01 10*3/uL (ref 0.00–0.07)
Basophils Absolute: 0 10*3/uL (ref 0.0–0.1)
Basophils Relative: 1 %
Eosinophils Absolute: 0.1 10*3/uL (ref 0.0–0.5)
Eosinophils Relative: 2 %
HCT: 40.7 % (ref 36.0–46.0)
Hemoglobin: 13.7 g/dL (ref 12.0–15.0)
Immature Granulocytes: 0 %
Lymphocytes Relative: 21 %
Lymphs Abs: 0.7 10*3/uL (ref 0.7–4.0)
MCH: 30.5 pg (ref 26.0–34.0)
MCHC: 33.7 g/dL (ref 30.0–36.0)
MCV: 90.6 fL (ref 80.0–100.0)
Monocytes Absolute: 0.4 10*3/uL (ref 0.1–1.0)
Monocytes Relative: 13 %
Neutro Abs: 2.1 10*3/uL (ref 1.7–7.7)
Neutrophils Relative %: 63 %
Platelets: 184 10*3/uL (ref 150–400)
RBC: 4.49 MIL/uL (ref 3.87–5.11)
RDW: 15.1 % (ref 11.5–15.5)
WBC: 3.3 10*3/uL — ABNORMAL LOW (ref 4.0–10.5)
nRBC: 0 % (ref 0.0–0.2)

## 2020-10-04 LAB — CMP (CANCER CENTER ONLY)
ALT: 14 U/L (ref 0–44)
AST: 20 U/L (ref 15–41)
Albumin: 3.9 g/dL (ref 3.5–5.0)
Alkaline Phosphatase: 70 U/L (ref 38–126)
Anion gap: 7 (ref 5–15)
BUN: 21 mg/dL (ref 8–23)
CO2: 29 mmol/L (ref 22–32)
Calcium: 9.7 mg/dL (ref 8.9–10.3)
Chloride: 106 mmol/L (ref 98–111)
Creatinine: 0.65 mg/dL (ref 0.44–1.00)
GFR, Estimated: >60 mL/min (ref >60)
Glucose, Bld: 84 mg/dL (ref 70–99)
Potassium: 3.9 mmol/L (ref 3.5–5.1)
Sodium: 142 mmol/L (ref 135–145)
Total Bilirubin: 0.5 mg/dL (ref 0.3–1.2)
Total Protein: 6.6 g/dL (ref 6.5–8.1)

## 2020-10-04 LAB — LACTATE DEHYDROGENASE: LDH: 327 U/L — ABNORMAL HIGH (ref 98–192)

## 2020-10-04 MED ORDER — HEPARIN SOD (PORK) LOCK FLUSH 100 UNIT/ML IV SOLN
500.0000 [IU] | Freq: Once | INTRAVENOUS | Status: AC
  Administered 2020-10-04: 500 [IU]
  Filled 2020-10-04: qty 5

## 2020-10-04 MED ORDER — SODIUM CHLORIDE 0.9% FLUSH
10.0000 mL | Freq: Once | INTRAVENOUS | Status: AC
  Administered 2020-10-04: 10 mL
  Filled 2020-10-04: qty 10

## 2020-10-04 MED ORDER — SODIUM CHLORIDE 0.9% FLUSH
10.0000 mL | Freq: Once | INTRAVENOUS | Status: AC
  Administered 2020-10-04: 10 mL via INTRAVENOUS
  Filled 2020-10-04: qty 10

## 2020-10-04 NOTE — Addendum Note (Signed)
Addended by: Bo Mcclintock on: 10/04/2020 04:27 PM   Modules accepted: Orders

## 2020-10-04 NOTE — Progress Notes (Signed)
Nutrition follow-up could not be completed secondary to cancellation of chemotherapy.

## 2020-10-05 ENCOUNTER — Other Ambulatory Visit: Payer: Self-pay | Admitting: Hematology

## 2020-10-05 ENCOUNTER — Encounter (INDEPENDENT_AMBULATORY_CARE_PROVIDER_SITE_OTHER): Payer: Medicare Other | Admitting: Ophthalmology

## 2020-10-06 ENCOUNTER — Ambulatory Visit (INDEPENDENT_AMBULATORY_CARE_PROVIDER_SITE_OTHER): Payer: Medicare Other | Admitting: Podiatry

## 2020-10-06 ENCOUNTER — Encounter: Payer: Self-pay | Admitting: Podiatry

## 2020-10-06 ENCOUNTER — Other Ambulatory Visit: Payer: Self-pay

## 2020-10-06 DIAGNOSIS — M79674 Pain in right toe(s): Secondary | ICD-10-CM | POA: Diagnosis not present

## 2020-10-06 DIAGNOSIS — I10 Essential (primary) hypertension: Secondary | ICD-10-CM | POA: Diagnosis not present

## 2020-10-06 DIAGNOSIS — B351 Tinea unguium: Secondary | ICD-10-CM | POA: Diagnosis not present

## 2020-10-06 DIAGNOSIS — G629 Polyneuropathy, unspecified: Secondary | ICD-10-CM | POA: Diagnosis not present

## 2020-10-06 DIAGNOSIS — F321 Major depressive disorder, single episode, moderate: Secondary | ICD-10-CM | POA: Diagnosis not present

## 2020-10-06 DIAGNOSIS — M79675 Pain in left toe(s): Secondary | ICD-10-CM | POA: Diagnosis not present

## 2020-10-06 DIAGNOSIS — C865 Angioimmunoblastic T-cell lymphoma: Secondary | ICD-10-CM | POA: Diagnosis not present

## 2020-10-07 IMAGING — PT NM PET TUM IMG RESTAG (PS) SKULL BASE T - THIGH
1 of 8 series · 1 of 25 positions shown · non-contrast
Comparison: 04/08/2019

CLINICAL DATA: Subsequent treatment strategy for T-cell lymphoma.
Status post chemo/immunotherapy.

EXAM:
NUCLEAR MEDICINE PET SKULL BASE TO THIGH
TECHNIQUE: 5.5 mCi F-18 FDG was injected intravenously. Full-ring PET imaging
was performed from the skull base to thigh after the radiotracer. CT
data was obtained and used for attenuation correction and anatomic
localization.
Fasting blood glucose: 90 mg/dl

[Series 4: ct sk_thigh 5.0 b31f · axial · 5.0mm · 0.98mm/px · 1 of 204 slices shown]
[im 204/204  brain]
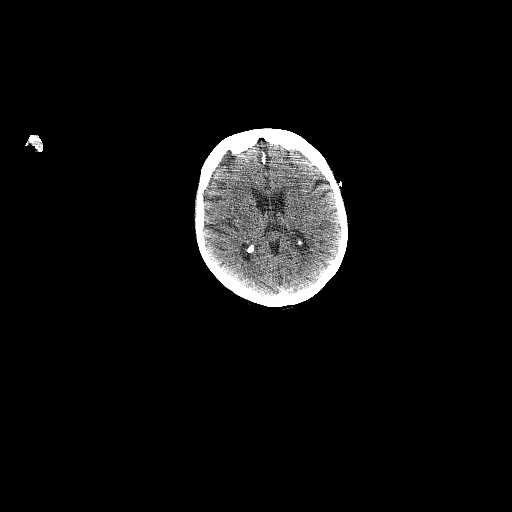

[1 of 25 positions shown; findings below may reference images not displayed]

FINDINGS: Mediastinal blood pool activity: SUV max

Liver activity: SUV max

NECK: Extensive muscular hypermetabolism, likely related to motion
after radiopharmaceutical injection. Given this mild limitation, no
residual cervical nodal hypermetabolism. Diffuse, left greater than
right thyroid hypermetabolism, including at a S.U.V. max of 5.9.
Similar to on the prior.

Incidental CT findings: No residual cervical adenopathy. Index left
posterior triangle node measures 4 mm on 36/4 versus 1.0 cm on the
prior exam

Bilateral carotid atherosclerosis.

CHEST: No residual thoracic nodal hypermetabolism. No pulmonary
parenchymal hypermetabolism.

Incidental CT findings: Resolution of thoracic adenopathy. Right
Port-A-Cath tip at superior caval/atrial junction. Aortic and
coronary artery atherosclerosis. Right larger than left pleural
effusions have resolved. Right upper lobe anterior subpleural
radiation fibrosis. A vague nodule along the right minor fissure of
4 mm on 38/8 is not readily apparent on the prior.

A right lower lobe subpleural 5 mm pulmonary nodule on 47/8 is
similar.

Lingular scarring.

ABDOMEN/PELVIS: No residual hypermetabolic nodes within the abdomen
or pelvis.

Incidental CT findings: Index left periaortic node of 7 mm on 119/4
is at the site of a 2.3 cm nodal conglomerate on the prior. Left
renal cyst. Index left inguinal node measures 5 mm on 170/4 versus
1.0 cm previously (when remeasured). Pelvic floor laxity.
Hysterectomy.

SKELETON: Mild diffuse marrow hypermetabolism is likely related to
stimulation by chemotherapy.

Incidental CT findings: none
IMPRESSION: 1. Complete metabolic response to therapy.
2. Resolution of bilateral pleural effusions.
3. Right lower lobe pulmonary nodule is similar. A nodule along the
right minor fissure is felt to be new since the prior. Recommend
attention on follow-up.
4. Coronary artery atherosclerosis. Aortic Atherosclerosis
([id number]).
5. Similar thyroid hypermetabolism, which can be seen with
thyroiditis.

## 2020-10-08 ENCOUNTER — Telehealth: Payer: Self-pay | Admitting: Hematology

## 2020-10-08 NOTE — Telephone Encounter (Signed)
Scheduled follow-up appointments per 5/16 los. Patient's daughter is aware.

## 2020-10-10 NOTE — Progress Notes (Signed)
  Subjective:  Patient ID: Sarah [name], female    DOB: May 28, 1932,  MRN: [id number]  87 y.o. female presents painful thick toenails that are difficult to trim. Pain interferes with ambulation. Aggravating factors include wearing enclosed shoe gear. Pain is relieved with periodic professional debridement.   PCP is Dr. Lajean Manes and last visit was 06/17/2020.  Her daughter is present on today's visit.  She notes no new pedal problems on today's visit.   Allergies  Allergen Reactions  . Citalopram Anxiety  . Alendronate Sodium Other (See Comments)  . Amoxicillin-Pot Clavulanate Other (See Comments)  . Codeine Other (See Comments)    Reaction not recalled  . Latex   . Lisinopril Other (See Comments)  . Septra [Sulfamethoxazole-Trimethoprim] Other (See Comments)  . Thiazide-Type Diuretics Other (See Comments)    Blurry vision?? (patient stated she has macular degeneration)    Review of Systems: Negative except as noted in the HPI.   Objective:   Constitutional Pt is a pleasant 87 y.o. Caucasian female WD, WN in NAD. AAO x 3.   Vascular Capillary refill time to digits immediate b/l. Palpable pedal pulses b/l LE. Pedal hair absent. Lower extremity skin temperature gradient within normal limits. No pain with calf compression b/l. Patient wearing compression hose on today's visit.  Neurologic Protective sensation intact 5/5 intact bilaterally with 10g monofilament b/l. Proprioception intact bilaterally.  Dermatologic Pedal skin with normal turgor, texture and tone bilaterally. No open wounds bilaterally. No interdigital macerations bilaterally. Toenails 2-5 bilaterally and L hallux elongated, discolored, dystrophic, thickened, and crumbly with subungual debris and tenderness to dorsal palpation. Anonychia noted R hallux. Nailbed(s) epithelialized.   Orthopedic: Normal muscle strength 5/5 to all lower extremity muscle groups bilaterally. No pain crepitus or joint limitation noted with  ROM b/l. Hammertoe(s) noted to the 2-5 bilaterally.   Radiographs: None Assessment:   1. Pain due to onychomycosis of toenails of both feet    Plan:  Patient was evaluated and treated and all questions answered.  Onychomycosis with pain -Nails palliatively debridement as below. -Educated on self-care  Procedure: Nail Debridement Rationale: Pain Type of Debridement: manual, sharp debridement. Instrumentation: Nail nipper, rotary burr. Number of Nails: 9  -Examined patient. -No new findings. No new orders. -Patient to continue soft, supportive shoe gear daily. -Toenails 2-5 bilaterally and L hallux debrided in length and girth without iatrogenic bleeding with sterile nail nipper and dremel.  -Patient to report any pedal injuries to medical professional immediately. -Patient/POA to call should there be question/concern in the interim.  Return in about 3 months (around 01/06/2021).  Marzetta Board, DPM

## 2020-10-11 ENCOUNTER — Other Ambulatory Visit: Payer: Self-pay | Admitting: Hematology

## 2020-10-11 DIAGNOSIS — G629 Polyneuropathy, unspecified: Secondary | ICD-10-CM

## 2020-10-11 DIAGNOSIS — C8440 Peripheral T-cell lymphoma, not classified, unspecified site: Secondary | ICD-10-CM

## 2020-10-11 DIAGNOSIS — R530 Neoplastic (malignant) related fatigue: Secondary | ICD-10-CM

## 2020-10-13 DIAGNOSIS — R928 Other abnormal and inconclusive findings on diagnostic imaging of breast: Secondary | ICD-10-CM | POA: Diagnosis not present

## 2020-10-13 DIAGNOSIS — R922 Inconclusive mammogram: Secondary | ICD-10-CM | POA: Diagnosis not present

## 2020-10-14 ENCOUNTER — Ambulatory Visit: Payer: Medicare Other | Attending: Hematology | Admitting: Physical Therapy

## 2020-10-14 ENCOUNTER — Other Ambulatory Visit: Payer: Self-pay

## 2020-10-14 DIAGNOSIS — Z9181 History of falling: Secondary | ICD-10-CM | POA: Diagnosis not present

## 2020-10-14 DIAGNOSIS — R293 Abnormal posture: Secondary | ICD-10-CM | POA: Diagnosis not present

## 2020-10-14 DIAGNOSIS — R262 Difficulty in walking, not elsewhere classified: Secondary | ICD-10-CM | POA: Insufficient documentation

## 2020-10-14 DIAGNOSIS — M6281 Muscle weakness (generalized): Secondary | ICD-10-CM

## 2020-10-14 DIAGNOSIS — R209 Unspecified disturbances of skin sensation: Secondary | ICD-10-CM | POA: Diagnosis not present

## 2020-10-14 NOTE — Therapy (Signed)
[place], [place], [address] Phone: [phone]   Fax:  [phone]  Physical Therapy Evaluation  Patient Details  Name: Sarah [name] MRN: [id number] Date of Birth: November 29, 1932 Referring Provider (PT): Dr. Irene Limbo   Encounter Date: 10/14/2020   PT End of Session - 10/14/20 1657    Visit Number 1    Number of Visits 17    Date for PT Re-Evaluation 12/14/20    PT Start Time 1500    PT Stop Time 1550    PT Time Calculation (min) 50 min    Activity Tolerance Patient tolerated treatment well           Past Medical History:  Diagnosis Date  . Allergy    codeine, thiazides  . Anxiety    new dx  . Arthritis   . Breast cancer ([place]) 03/14/12   bx=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,ER/PR=+,upper inner quad  . Cancer ([place])    breast  . Cataract   . DVT (deep venous thrombosis) ([place]) 02/2019   left leg  . Dyspnea   . Edema    both legs feet and toe, abdomen  . Glaucoma    laser treated years ago  . HOH (hard of hearing)   . Hyperlipemia   . Hypertension   . Hypothyroidism   . Pancreatic cyst    benign  . PONV (postoperative nausea and vomiting)   . Pulmonary embolism ([place]) 02/2019   bilateral   . Radiation 06/11/2012-07/12/2012   17 sessions 4250 cGy, 3 sessions 750 cGy  . Vertigo   . Wears glasses   . Wears partial dentures    partial upper    Past Surgical History:  Procedure Laterality Date  . ABDOMINAL HYSTERECTOMY  1966   1/2 ovary left in   . BREAST SURGERY  1992   lumpectomy-lt  . CATARACT EXTRACTION     b/l  . COLONOSCOPY    . EXCISION MASS NECK Left 03/17/2019    EXCISION MASS NECK (Left Neck)  . EXCISION MASS NECK Left 03/17/2019   Procedure: EXCISION MASS NECK;  Surgeon: Helayne Seminole, MD;  Location: [facility];  Service: ENT;  Laterality: Left;  . EYE SURGERY Bilateral    bilateral cataract removal  . IR IMAGING GUIDED PORT INSERTION   04/23/2019  . JOINT REPLACEMENT  2013   rt total knee  . JOINT REPLACEMENT  1995   lt total knee  . PARTIAL MASTECTOMY WITH NEEDLE LOCALIZATION  04/09/2012   Procedure: PARTIAL MASTECTOMY WITH NEEDLE LOCALIZATION;  Surgeon: Adin Hector, MD;  Location: [facility];  Service: General;  Laterality: Right;  . SKIN BIOPSY Left 03/17/2019   LEFT THIGH  . SKIN BIOPSY Left 03/17/2019   Procedure: Skin Biopsy Left Thigh;  Surgeon: Helayne Seminole, MD;  Location: [facility];  Service: ENT;  Laterality: Left;  . TONSILLECTOMY    . TOTAL KNEE ARTHROPLASTY  08/16/2011   Procedure: TOTAL KNEE ARTHROPLASTY;  Surgeon: Ninetta Lights, MD;  Location: [facility];  Service: Orthopedics;  Laterality: Right;    There were no vitals filed for this visit.    Subjective Assessment - 10/14/20 1516    Subjective Pt states she has been having touble walking due to pain in her knees and has recently started to use a pain that she picked up on her own about a week ago    Patient is accompained by: Family member   daughter [name]  Pertinent History pt with Angioimmunoblastic T cell lymphoma  with treatment starting  November 2020 and has had diffiuclty since. She gets chemo once every 3 weeks. Past history includes bilateral TKR., right breast cancer 03/14/2012    Patient Stated Goals to walk without a cane    Currently in Pain? No/denies              [facility] PT Assessment - 10/14/20 0001      Assessment   Medical Diagnosis Angioimmunoblastic T cell lymphoma    Referring Provider (PT) Dr. Irene Limbo    Onset Date/Surgical Date 03/24/19    Hand Dominance Right      Precautions   Precautions Fall      Restrictions   Weight Bearing Restrictions No      Balance Screen   Has the patient fallen in the past 6 months Yes    How many times? 2    Has the patient had a decrease in activity level because of a fear of falling?  Yes    Is the patient reluctant to leave their home because of a fear of  falling?  Yes      [place] residence    Living Arrangements Alone    Type of [place] to enter    Entrance Stairs-Number of Steps 4    Entrance Stairs-Rails Right;Left;Cannot reach both    [place] Two level;1/2 bath on main level    [place] - single point;Walker - 2 wheels      Prior Function   Level of Independence Independent    Vocation Retired    Leisure belongs to [employer], sometimes decorates to church, lunches with friends      Cognition   Overall Cognitive Status Within Functional Limits for tasks assessed      Sensation   Additional Comments Pt with decreased sensation in both feet She states that the bottom of her feet feel like Forensic scientist   Gross Motor Movements are Fluid and Coordinated No      Functional Tests   Functional tests Sit to Stand      Sit to Stand   Comments 4 repetitions in 30 seconds.  Pt has difficulty gaining balance and standing up straight      Posture/Postural Control   Posture/Postural Control Postural limitations    Postural Limitations Rounded Shoulders;Forward head;Left pelvic obliquity    Posture Comments left leg is longer than right and pt has pelvic obliquity, she reports she has had this for along time      ROM / Strength   AROM / PROM / Strength AROM;Strength;PROM      AROM   Overall AROM  Deficits    Overall AROM Comments decreased ankle dorsiflexion      Strength   Overall Strength Deficits    Overall Strength Comments pt is very thin, pt has decrased dorsiflexion in both feet, left is weaker than right    Strength Assessment Site Ankle;Hand;Hip;Knee    Right/Left hand Right;Left    Right Hand Grip (lbs) 40/38/35    Left Hand Grip (lbs) 30/25/20    Right/Left Hip Right;Left    Right Hip Flexion 3+/5    Right Hip Extension 3+/5    Right Hip ABduction 3+/5    Left Hip Flexion 3+/5    Left Hip Extension 3+/5    Left Hip  ABduction 3+/5  Right/Left Knee Right;Left    Right Knee Flexion 3+/5    Right Knee Extension 3+/5    Left Knee Flexion 3+/5    Left Knee Extension 3+/5    Right/Left Ankle Right;Left    Right Ankle Dorsiflexion 2+/5    Right Ankle Plantar Flexion 3-/5    Left Ankle Dorsiflexion 2-/5    Left Ankle Plantar Flexion 2+/5      Bed Mobility   Bed Mobility Rolling Right;Rolling Left;Supine to Sit;Sit to Supine    Rolling Right Independent    Rolling Left Independent    Supine to Sit Independent    Sit to Supine Independent      Transfers   Transfers Sit to Stand;Stand to Sit    Sit to Stand 6: Modified independent (Device/Increase time)   pt takes extra time to find balance and has difficutly with full hip extension   Stand to Sit 6: Modified independent (Device/Increase time)   pt has difficulty controlling descent to sit     Ambulation/Gait   Ambulation/Gait Yes    Ambulation/Gait Assistance 5: Supervision    Ambulation/Gait Assistance Details pt needs cues to slow down, emphasize heel strike. She holds cane in right hand and is able to naturally get reciprocol sequence but loses control of cane and balance. She benefit from tactile control at hips for stability    Ambulation Distance (Feet) 50 Feet    Assistive device Straight cane    Gait Pattern Step-through pattern;Decreased dorsiflexion - right;Decreased dorsiflexion - left;Ataxic;Trunk flexed;Poor foot clearance - left;Poor foot clearance - right                      Objective measurements completed on examination: See above findings.       [facility] Adult PT Treatment/Exercise - 10/14/20 0001      Self-Care   Self-Care Other Self-Care Comments    Other Self-Care Comments  Pt advised to use front wheeled rolling walker at home for now as she appears to be a fall risk using straight cane. She acknoweldged and agrees that safety is more important at this time.      Exercises   Exercises Ankle      Ankle  Exercises: Standing   Heel Raises 10 reps    Toe Raise 10 reps   pt instructed to do this at home as home exercise                 PT Education - 10/14/20 1637    Education Details standing at counter for dorsi and plantar flexion exericse    Person(s) Educated Patient;Child(ren)    Methods Explanation;Demonstration    Comprehension Verbalized understanding;Returned demonstration            PT Short Term Goals - 10/14/20 1647      PT SHORT TERM GOAL #1   Title Pt will be independent with HEP within 3 weeks in order to demonstrate autonomy of care.    Time 4    Period Weeks    Status Deferred      PT SHORT TERM GOAL #2   Title Pt will 3/5 dorsiflexion of left ankle to decrease risk of fall while walking    Baseline 2-/5 on 10/14/2020    Time 4    Period Weeks    Status New      PT SHORT TERM GOAL #3   Title Pt will demonstrate safe technique in getting up and down 8 steps using handrail  as neede    Time 4    Period Weeks    Status New             PT Long Term Goals - 10/14/20 1648      PT LONG TERM GOAL #1   Title Pt wil increase sit to stand to 8 in 30 seconds indicating an increase in fuctional strengthe    Baseline 4 in 30 seconds    Time 8    Period Weeks    Status New      PT LONG TERM GOAL #2   Title Pt will decrease TUG time by 3 seconds indicating and decrease in risk for fall    Time 8    Period Weeks    Status New      PT LONG TERM GOAL #3   Title Pt will be able to walk 50 feet with cane and holding 5 pounds weight so that she safely do household chores.    Period Weeks    Status New      PT LONG TERM GOAL #4   Title Pt can walk 50 feet without the use of her cane so that she can safely walk across the rooms in her home    Time 8    Period Weeks    Status New                  Plan - 10/14/20 1637    Clinical Impression Statement Pt comes to PT after treatment for lymphoma with chemotherapy induced peripheral neuropathy  involving dorsi and plantar flexor weakness bilaterally left greater than right. She also has decreased sensation in her feet that is limiting her balance in gait. She will benefit from PT for strengthening, gait and balance training. Hopefully she will improve in dorsiflexion strength and will not need orthotices for gait safety, but will assess that based on her response after about 4 weeks of PT.  Pt also advised to use her RW for now as safety as her straight cane appears to impede her even more at times. Pt and daughter agrees to plan. Pt was instructed in home exericise for beginning ankle strength.    Personal Factors and Comorbidities Comorbidity 3+    Comorbidities lymphoma with chemotherapy., bilateral TKR, osteopenia    Examination-Activity Limitations Stand;Stairs;Locomotion Level;Lift;Squat;Transfers    Examination-Participation Restrictions Volunteer;Church;Laundry    Stability/Clinical Decision Making Stable/Uncomplicated    Clinical Decision Making Low    Rehab Potential Good    PT Frequency 2x / week    PT Duration 8 weeks    PT Treatment/Interventions ADLs/Self Care Home Management;Moist Heat;Gait training;Stair training;DME Instruction;Functional mobility training;Therapeutic activities;Therapeutic exercise;Balance training;Neuromuscular re-education;Patient/family education;Orthotic Fit/Training    PT Next Visit Plan do TUG for goal baseline strengthening to ankles, hips and kness, practice stair training strategies, gait training with walker, teach HEP and progress    PT Home Exercise Plan standing ankle dorsi and plantar flexion    Consulted and Agree with Plan of Care Patient           Patient will benefit from skilled therapeutic intervention in order to improve the following deficits and impairments:  Abnormal gait,Decreased balance,Decreased endurance,Decreased mobility,Difficulty walking,Impaired sensation,Decreased activity tolerance,Decreased safety awareness,Decreased  strength,Postural dysfunction,Pain  Visit Diagnosis: Muscle weakness (generalized) - Plan: PT plan of care cert/re-cert  Difficulty in walking, not elsewhere classified - Plan: PT plan of care cert/re-cert  Unspecified disturbances of skin sensation - Plan: PT plan of care cert/re-cert  History  of falling - Plan: PT plan of care cert/re-cert  Abnormal posture - Plan: PT plan of care cert/re-cert     Problem List Patient Active Problem List   Diagnosis Date Noted  . Abnormal gait 06/22/2020  . Age-related osteoporosis without current pathological fracture 06/22/2020  . Anxiety 06/22/2020  . Hardening of the aorta (main artery of the heart) ([place]) 06/22/2020  . Malignant lymphoma, non-Hodgkin's ([place]) 06/22/2020  . Mild cognitive disorder 06/22/2020  . Non-pressure chronic ulcer of unspecified part of unspecified lower leg with unspecified severity ([place]) 06/22/2020  . Primary insomnia 06/22/2020  . Pulmonary nodule 06/22/2020  . 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 Abnormal chest x-ray 12/31/2019  . Cough 12/31/2019  . Port-A-Cath in place 06/04/2019  . 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[place]) 04/14/2019  . Counseling regarding advance care planning and goals of care 04/14/2019  . Lymphadenopathy of head and neck 03/17/2019  . Lymph nodes enlarged 03/17/2019  . History of pulmonary embolus (PE) 03/17/2019  . Acute pulmonary embolism ([place]) 03/06/2019  . Diffuse lymphadenopathy 03/06/2019  . Pleural effusion 03/06/2019  . Pruritus 02/06/2019  . Pruritic rash 02/06/2019  . Angio-edema 02/06/2019  . Shortness of breath 02/06/2019  . Osteopenia 12/18/2013  . Heart block AV first degree 12/18/2013  . Breast cancer of upper-inner quadrant of right female breast ([place]) 03/19/2012  . Right knee DJD 08/18/2011  . Hypothyroidism   . Hypertension   . PONV (postoperative nausea and vomiting)   . Hyperlipemia    Donato Heinz. Owens Shark PT  [place] Levo 10/14/2020, 4:59 PM  [place], [place], [address] Phone: [phone]   Fax:  [phone]  Name: Sarah [name] MRN: [id number] Date of Birth: 1932/06/23

## 2020-10-26 ENCOUNTER — Other Ambulatory Visit: Payer: Self-pay | Admitting: Hematology

## 2020-10-26 ENCOUNTER — Inpatient Hospital Stay: Payer: Medicare Other | Attending: Hematology

## 2020-10-26 ENCOUNTER — Inpatient Hospital Stay (HOSPITAL_BASED_OUTPATIENT_CLINIC_OR_DEPARTMENT_OTHER): Payer: Medicare Other | Admitting: Physician Assistant

## 2020-10-26 ENCOUNTER — Other Ambulatory Visit: Payer: Self-pay

## 2020-10-26 ENCOUNTER — Inpatient Hospital Stay: Payer: Medicare Other | Admitting: Dietician

## 2020-10-26 ENCOUNTER — Inpatient Hospital Stay: Payer: Medicare Other

## 2020-10-26 DIAGNOSIS — E162 Hypoglycemia, unspecified: Secondary | ICD-10-CM | POA: Diagnosis not present

## 2020-10-26 DIAGNOSIS — Z5111 Encounter for antineoplastic chemotherapy: Secondary | ICD-10-CM

## 2020-10-26 DIAGNOSIS — Z853 Personal history of malignant neoplasm of breast: Secondary | ICD-10-CM | POA: Diagnosis not present

## 2020-10-26 DIAGNOSIS — Z452 Encounter for adjustment and management of vascular access device: Secondary | ICD-10-CM | POA: Insufficient documentation

## 2020-10-26 DIAGNOSIS — C865 Angioimmunoblastic T-cell lymphoma: Secondary | ICD-10-CM | POA: Insufficient documentation

## 2020-10-26 DIAGNOSIS — I2699 Other pulmonary embolism without acute cor pulmonale: Secondary | ICD-10-CM | POA: Diagnosis not present

## 2020-10-26 DIAGNOSIS — G629 Polyneuropathy, unspecified: Secondary | ICD-10-CM

## 2020-10-26 DIAGNOSIS — E785 Hyperlipidemia, unspecified: Secondary | ICD-10-CM | POA: Diagnosis not present

## 2020-10-26 DIAGNOSIS — Z95828 Presence of other vascular implants and grafts: Secondary | ICD-10-CM

## 2020-10-26 DIAGNOSIS — E039 Hypothyroidism, unspecified: Secondary | ICD-10-CM | POA: Diagnosis not present

## 2020-10-26 DIAGNOSIS — C8440 Peripheral T-cell lymphoma, not classified, unspecified site: Secondary | ICD-10-CM

## 2020-10-26 LAB — CMP (CANCER CENTER ONLY)
ALT: 14 U/L (ref 0–44)
AST: 20 U/L (ref 15–41)
Albumin: 3.9 g/dL (ref 3.5–5.0)
Alkaline Phosphatase: 71 U/L (ref 38–126)
Anion gap: 11 (ref 5–15)
BUN: 22 mg/dL (ref 8–23)
CO2: 27 mmol/L (ref 22–32)
Calcium: 9.9 mg/dL (ref 8.9–10.3)
Chloride: 105 mmol/L (ref 98–111)
Creatinine: 0.78 mg/dL (ref 0.44–1.00)
GFR, Estimated: >60 mL/min (ref >60)
Glucose, Bld: 109 mg/dL — ABNORMAL HIGH (ref 70–99)
Potassium: 3.8 mmol/L (ref 3.5–5.1)
Sodium: 143 mmol/L (ref 135–145)
Total Bilirubin: 0.5 mg/dL (ref 0.3–1.2)
Total Protein: 6.8 g/dL (ref 6.5–8.1)

## 2020-10-26 LAB — CBC WITH DIFFERENTIAL/PLATELET
Abs Immature Granulocytes: 0.01 10*3/uL (ref 0.00–0.07)
Basophils Absolute: 0 10*3/uL (ref 0.0–0.1)
Basophils Relative: 0 %
Eosinophils Absolute: 0.1 10*3/uL (ref 0.0–0.5)
Eosinophils Relative: 2 %
HCT: 42.5 % (ref 36.0–46.0)
Hemoglobin: 14.1 g/dL (ref 12.0–15.0)
Immature Granulocytes: 0 %
Lymphocytes Relative: 12 %
Lymphs Abs: 0.6 10*3/uL — ABNORMAL LOW (ref 0.7–4.0)
MCH: 30.6 pg (ref 26.0–34.0)
MCHC: 33.2 g/dL (ref 30.0–36.0)
MCV: 92.2 fL (ref 80.0–100.0)
Monocytes Absolute: 0.4 10*3/uL (ref 0.1–1.0)
Monocytes Relative: 10 %
Neutro Abs: 3.4 10*3/uL (ref 1.7–7.7)
Neutrophils Relative %: 76 %
Platelets: 119 10*3/uL — ABNORMAL LOW (ref 150–400)
RBC: 4.61 MIL/uL (ref 3.87–5.11)
RDW: 14.6 % (ref 11.5–15.5)
WBC: 4.5 10*3/uL (ref 4.0–10.5)
nRBC: 0 % (ref 0.0–0.2)

## 2020-10-26 LAB — LACTATE DEHYDROGENASE: LDH: 262 U/L — ABNORMAL HIGH (ref 98–192)

## 2020-10-26 MED ORDER — DEXAMETHASONE SODIUM PHOSPHATE 10 MG/ML IJ SOLN
INTRAMUSCULAR | Status: AC
  Filled 2020-10-26: qty 1

## 2020-10-26 MED ORDER — FAMOTIDINE 20 MG IN NS 100 ML IVPB
INTRAVENOUS | Status: AC
  Filled 2020-10-26: qty 100

## 2020-10-26 MED ORDER — ONDANSETRON HCL 4 MG/2ML IJ SOLN
INTRAMUSCULAR | Status: AC
  Filled 2020-10-26: qty 2

## 2020-10-26 MED ORDER — SODIUM CHLORIDE 0.9% FLUSH
10.0000 mL | Freq: Once | INTRAVENOUS | Status: AC
  Administered 2020-10-26: 10 mL
  Filled 2020-10-26: qty 10

## 2020-10-26 MED ORDER — DIPHENHYDRAMINE HCL 50 MG/ML IJ SOLN
INTRAMUSCULAR | Status: AC
  Filled 2020-10-26: qty 1

## 2020-10-26 MED ORDER — ACETAMINOPHEN 325 MG PO TABS
ORAL_TABLET | ORAL | Status: AC
  Filled 2020-10-26: qty 2

## 2020-10-26 NOTE — Progress Notes (Signed)
HEMATOLOGY/ONCOLOGY CLINIC NOTE  Date of Service: 10/26/2020  Patient Care Team: Lajean Manes, MD as PCP - General (Internal Medicine) Buford Dresser, MD as PCP - Cardiology (Cardiology)  REFERRING PHYSICIAN: Lajean Manes, MD  CHIEF COMPLAINT: Angioimmunoblastic T cell lymphoma      INTERVAL HISTORY:  Sarah [name] is a 87 y.o. female here for evaluation and management of Angioimmunoblastic T-cell lymphoma. She is accompanied by her daughter for this visit. Patient reports that her energy levels are stable. She has a fair appetite and notes feeling full easily. She tries to supplement her diet with protein shakes 1-2 times per day. She denies any nausea, vomiting or abdominal pain. Her bowel movements are regular without any diarrhea or constipation. Patient continues to experience persistent neuropathy in her feet, which is worse since the last visit. She reports that neuropathy does interfere with her balance. She had a fall five days ago but she is uncertain how she fell or if she hit her head. Patient uses a cane or walker to ambulate. Patient denies any fevers, chills, night sweats, shortness of breath, chest pain or cough. She has no other complaints.  MEDICAL HISTORY:  Past Medical History:  Diagnosis Date  . Allergy    codeine, thiazides  . Anxiety    new dx  . Arthritis   . Breast cancer ([place]) 03/14/12   bx=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,ER/PR=+,upper inner quad  . Cancer ([place])    breast  . Cataract   . DVT (deep venous thrombosis) ([place]) 02/2019   left leg  . Dyspnea   . Edema    both legs feet and toe, abdomen  . Glaucoma    laser treated years ago  . HOH (hard of hearing)   . Hyperlipemia   . Hypertension   . Hypothyroidism   . Pancreatic cyst    benign  . PONV (postoperative nausea and vomiting)   . Pulmonary embolism ([place]) 02/2019   bilateral   . Radiation 06/11/2012-07/12/2012   17 sessions 4250 cGy, 3 sessions  750 cGy  . Vertigo   . Wears glasses   . Wears partial dentures    partial upper     SURGICAL HISTORY: Past Surgical History:  Procedure Laterality Date  . ABDOMINAL HYSTERECTOMY  1966   1/2 ovary left in   . BREAST SURGERY  1992   lumpectomy-lt  . CATARACT EXTRACTION     b/l  . COLONOSCOPY    . EXCISION MASS NECK Left 03/17/2019    EXCISION MASS NECK (Left Neck)  . EXCISION MASS NECK Left 03/17/2019   Procedure: EXCISION MASS NECK;  Surgeon: Helayne Seminole, MD;  Location: [facility];  Service: ENT;  Laterality: Left;  . EYE SURGERY Bilateral    bilateral cataract removal  . IR IMAGING GUIDED PORT INSERTION  04/23/2019  . JOINT REPLACEMENT  2013   rt total knee  . JOINT REPLACEMENT  1995   lt total knee  . PARTIAL MASTECTOMY WITH NEEDLE LOCALIZATION  04/09/2012   Procedure: PARTIAL MASTECTOMY WITH NEEDLE LOCALIZATION;  Surgeon: Adin Hector, MD;  Location: [facility];  Service: General;  Laterality: Right;  . SKIN BIOPSY Left 03/17/2019   LEFT THIGH  . SKIN BIOPSY Left 03/17/2019   Procedure: Skin Biopsy Left Thigh;  Surgeon: Helayne Seminole, MD;  Location: [facility];  Service: ENT;  Laterality: Left;  . TONSILLECTOMY    . TOTAL KNEE ARTHROPLASTY  08/16/2011   Procedure: TOTAL KNEE  ARTHROPLASTY;  Surgeon: Ninetta Lights, MD;  Location: [facility];  Service: Orthopedics;  Laterality: Right;     SOCIAL HISTORY: Social History   Socioeconomic History  . Marital status: Widowed    Spouse name: Not on file  . Number of children: 2  . Years of education: Not on file  . Highest education level: Not on file  Occupational History  . Occupation: Retired    Comment: Community education officer   Tobacco Use  . Smoking status: Never Smoker  . Smokeless tobacco: Never Used  Vaping Use  . Vaping Use: Never used  Substance and Sexual Activity  . Alcohol use: No  . Drug use: No  . Sexual activity: Not Currently  Other Topics Concern  . Not on file  Social History  Narrative  . Not on file   Social Determinants of Health   Financial Resource Strain: Not on file  Food Insecurity: Not on file  Transportation Needs: Not on file  Physical Activity: Not on file  Stress: Not on file  Social Connections: Not on file  Intimate Partner Violence: Not on file     FAMILY HISTORY: Family History  Problem Relation Age of Onset  . Heart disease Father   . Cancer Maternal Aunt        stomach     ALLERGIES:   is allergic to citalopram, alendronate sodium, amoxicillin-pot clavulanate, codeine, latex, lisinopril, septra [sulfamethoxazole-trimethoprim], and thiazide-type diuretics.   MEDICATIONS:  Current Outpatient Medications  Medication Sig Dispense Refill  . amLODipine (NORVASC) 2.5 MG tablet Take 2.5-5 mg by mouth See admin instructions. 5 mg in the morning, 2.5 mg in the evening    . amoxicillin (AMOXIL) 500 MG capsule Take 4 capsules (2,000 mg total) by mouth once as needed for up to 1 dose. 60 mins prior to dental procedure (Patient not taking: Reported on 10/14/2020) 4 capsule 0  . apixaban (ELIQUIS) 2.5 MG TABS tablet Take 1 tablet (2.5 mg total) by mouth 2 (two) times daily. 60 tablet 5  . benzonatate (TESSALON) 100 MG capsule Take 1 capsule (100 mg total) by mouth 3 (three) times daily as needed for cough. (Patient not taking: Reported on 10/14/2020) 30 capsule 0  . Cholecalciferol (VITAMIN D) 50 MCG (2000 UT) tablet Take 2,000 Units by mouth 2 (two) times daily.    . clonazepam (KLONOPIN) 0.125 MG disintegrating tablet Take 1 tablet (0.125 mg total) by mouth 2 (two) times daily. (Patient not taking: Reported on 10/14/2020) 60 tablet 0  . Cyanocobalamin (VITAMIN B-12 PO) Take 3,000 mcg by mouth daily.     Marland Kitchen gabapentin (NEURONTIN) 100 MG capsule Take 2 capsules (200 mg total) by mouth at bedtime. (Patient not taking: Reported on 10/14/2020) 60 capsule 2  . levothyroxine (SYNTHROID) 25 MCG tablet Take 25 mcg by mouth daily before breakfast.     .  lidocaine-prilocaine (EMLA) cream Apply to affected area once 30 g 3  . LORazepam (ATIVAN) 0.5 MG tablet TAKE 1/2 TO 1 (ONE-HALF TO ONE) TABLET BY MOUTH EVERY 8 HOURS 30 tablet 0  . Multiple Vitamins-Minerals (PRESERVISION AREDS PO) Take 1 capsule by mouth 2 (two) times daily.     . ondansetron (ZOFRAN) 8 MG tablet Take 1 tablet (8 mg total) by mouth 2 (two) times daily as needed. Start on the third day after chemotherapy. (Patient not taking: Reported on 10/14/2020) 30 tablet 1  . polyethylene glycol (MIRALAX / GLYCOLAX) 17 g packet Take 17 g by mouth daily. (Patient not  taking: Reported on 10/14/2020)    . prochlorperazine (COMPAZINE) 10 MG tablet Take 1 tablet (10 mg total) by mouth every 6 (six) hours as needed (Nausea or vomiting). (Patient not taking: Reported on 10/14/2020) 30 tablet 1  . sodium chloride (MURO 128) 5 % ophthalmic ointment Place 1 application into both eyes at bedtime.    Marland Kitchen XIIDRA 5 % SOLN Place 1 drop into both eyes daily as needed (dry eyes).  (Patient not taking: Reported on 10/14/2020)    . zolpidem (AMBIEN) 5 MG tablet Take 5 mg by mouth at bedtime as needed for sleep.      No current facility-administered medications for this visit.     Review of Systems  Constitutional: Negative for chills and fever.  Eyes: Negative.   Respiratory: Negative for cough and shortness of breath.   Cardiovascular: Negative for chest pain, palpitations and leg swelling.  Gastrointestinal: Negative for abdominal pain, blood in stool, constipation, diarrhea, melena, nausea and vomiting.  Musculoskeletal: Positive for falls.  Skin: Negative for itching and rash.  Neurological: Negative for dizziness, focal weakness and headaches.  Endo/Heme/Allergies: Does not bruise/bleed easily.     PHYSICAL EXAMINATION: ECOG FS:1 - Symptomatic but completely ambulatory  There were no vitals filed for this visit. Wt Readings from Last 3 Encounters:  10/26/20 108 lb 4 oz (49.1 kg)  10/04/20 109 lb  11.2 oz (49.8 kg)  09/14/20 109 lb 1.6 oz (49.5 kg)   There is no height or weight on file to calculate BMI.    Exam was given in a chair.  GENERAL:alert, in no acute distress and comfortable SKIN: no acute rashes, no significant lesions EYES: conjunctiva are pink and non-injected, sclera anicteric LUNGS: clear to auscultation b/l with normal respiratory effort HEART: regular rate & rhythm ABDOMEN:  normoactive bowel sounds , non tender, not distended. Extremity: no pedal edema PSYCH: alert & oriented x 3 with fluent speech NEURO: no focal motor/sensory deficits   LABORATORY DATA:  I have reviewed the data as listed  CBC Latest Ref Rng & Units 10/26/2020 10/04/2020 09/14/2020  WBC 4.0 - 10.5 K/uL 4.5 3.3(L) 5.1  Hemoglobin 12.0 - 15.0 g/dL 14.1 13.7 13.9  Hematocrit 36.0 - 46.0 % 42.5 40.7 42.7  Platelets 150 - 400 K/uL 119(L) 184 203    CMP Latest Ref Rng & Units 10/04/2020 09/14/2020 08/24/2020  Glucose 70 - 99 mg/dL 84 122(H) 109(H)  BUN 8 - 23 mg/dL 21 24(H) 25(H)  Creatinine 0.44 - 1.00 mg/dL 0.65 0.80 0.73  Sodium 135 - 145 mmol/L 142 143 143  Potassium 3.5 - 5.1 mmol/L 3.9 4.2 3.9  Chloride 98 - 111 mmol/L 106 106 104  CO2 22 - 32 mmol/L 29 29 27   Calcium 8.9 - 10.3 mg/dL 9.7 9.7 9.3  Total Protein 6.5 - 8.1 g/dL 6.6 6.9 6.8  Total Bilirubin 0.3 - 1.2 mg/dL 0.5 0.6 0.4  Alkaline Phos 38 - 126 U/L 70 73 80  AST 15 - 41 U/L 20 20 18   ALT 0 - 44 U/L 14 13 15    . Lab Results  Component Value Date   LDH 327 (H) 10/04/2020    Component     Latest Ref Rng & Units 03/27/2019  Hepatitis B Surface Ag     NON REACTIVE NON REACTIVE  Hep B Core Total Ab     NON REACTIVE NON REACTIVE  HCV Ab     NON REACTIVE NON REACTIVE  LDH     98 -  192 U/L 252 (H)   . Lab Results  Component Value Date   LDH 327 (H) 10/04/2020      04/08/2019 NM PET Image Initial (PI) Skull Base To Thigh (Accession [id number])  04/04/2019 PATHOLOGY   04/07/2019 ECHOCARDIOGRAM  03/28/2019  PATHOLOGY   03/06/2019 CT ANGIOGRAPHY CHEST WITH CONTRAST (Accession [id number])  04/07/2019 ECHOCARDIOGRAM  03/06/2019 ECHOCARDIOGRAM     03/06/2019 PORTABLE CHEST 1 VIEW (Accession [id number])    02/27/2019 CT CHEST, ABDOMEN, AND PELVIS WITH CONTRAST (Accession [id number])    02/17/2019 CHEST XRAY - 2 VIEW (Accession [id number])    RADIOGRAPHIC STUDIES: I have personally reviewed the radiological images as listed and agreed with the findings in the report. No results found.   ASSESSMENT & PLAN:   Sarah [name] is a 87 y.o. female with:  1. 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 SKIN, LEFT THIGH, BIOPSY: - Involvement by angioimmunoblastic T-cell lymphoma." -04/07/2019 Echocardiogram - normal EF -04/08/2019 NM PET Image Initial (PI) Skull Base To Thigh (Accession [id number]) revealed "1. Widespread hypermetabolic adenopathy in the neck, chest, and abdomen/pelvis, primarily in the Deauville 4 and Deauville 5 range. There is also hypermetabolic activity along the posterior nasopharynx in lingual tonsillar regions potentially indicating sites of involvement. 2. The subtle hypodensities in the spine and bony pelvis are not appreciably hypermetabolic may be incidental, surveillance is suggested. 3. Bilateral thyroid activity but especially on the left. Probably from thyroiditis. 4. A 6 mm subpleural nodule anteriorly in the right lower lobe not appreciably hypermetabolic but is below sensitive PET-CT size thresholds and merits surveillance. 5. Mildly accentuated diffuse splenic activity without splenomegaly or focal splenic lesion identified. 6. Other imaging findings of potential clinical significance: Aortic Atherosclerosis (ICD10-I70.0). Coronary atherosclerosis. Large right and small left pleural effusions. Moderate  cardiomegaly. Small pericardial effusion. Mild mesenteric edema. Large left renal cyst. Fullness of both renal collecting systems with suspected nonobstructive left nephrolithiasis. Degenerative grade 1 anterolisthesis at L4-5 at L5-S1. Trace pelvic ascites." -05/28/2019 PET/CT ([id number]) revealed "Complete metabolic response to therapy". -09/16/2019 PET/CT ([id number]) revealed "1. Unfortunately evidence of lymphoma recurrence at sites of previous hypermetabolic adenopathy. Hypermetabolic lymphoid tissue is new from comparison PET-CT 05/28/2019 but at same locations as PET-CT 04/08/2019. The number of metastatic lymph nodes is less than 04/08/2019. Lymphoid tissue at the RIGHT skull base is larger. 2. Three foci of hypermetabolic recurrence are present, lymphoid tissue at the RIGHT base of skull, hypermetabolic RIGHT hilar lymph node and hypermetabolic lymph node in the porta hepatis." -11/25/2019 PET/CT ([id number]) revealed "1. Marked interval improvement in the hypermetabolic disease identified previously in the posterior right nasopharynx, compatible with Deauville 4 today. 2. Decreased hypermetabolism in the right hilar and porta hepatis lymph nodes identified previously (Deauville 3). No new sites of hypermetabolic disease in the chest, abdomen, or pelvis. 3. Similar diffuse thyroid uptake, left greater than right. 4. No substantial change in right-sided pulmonary nodules." -04/09/2020 PET/CT ([id number]) revealed "No evidence progressive lymphoma."  2. Pulmonary Embolism -extensive likely related to extensive malignancy  3. H/o Breast Cancer  -The patient had bilateral diagnostic  mammography at [facility] 07/11/2011. This  showed some calcifications in the right  breast, which seemed a little bit more  prominent than prior. In the left breast  there was an area of possible  architectural distortion. However left  breast ultrasound the same day showed  no abnormality. 6 month followup was  suggested,  and  on 02/28/2012 the  patient again had bilateral diagnostic  mammography, now with right  ultrasonography. The microcalcifications  in the right breast appeared increased.  Ultrasound showed a hypoechoic lesion  measuring 5 mm, with no associated  shadowing. This had been previously  noted and appeared unchanged. Anterior  to that there was a small hypoechoic  mass measuring 4 mm in diameter.  There was felt to be suspicious, and on  03/14/2012 the patient underwent biopsy  of the right breast mass, showing ([id number]  [id number]) ductal carcinoma in situ,  intermediate grade, estrogen receptor  100% and progesterone receptor 100%  positive.   -Bilateral breast MRI was obtained  03/26/2012. This showed only post  biopsy changes in the right breast,  associated with an area of non-masslike  enhancement measuring 2.4 cm. The  left breast was unremarkable, and there  was no enlarged axillary or internal  mammary adenopathy noted.  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 The patient's  subsequent history is as detailed below.   PLAN: # Angioimmunoblastic lymphoma: -Reviewed lab results from today, 10/26/2020. Platelets dropped to 119K, continue to monitor.  -No lab or clinical evidence of progression/recurrence at this time.  -Due to worsening neuropathy and recent fall, will hold treatment today.  -Patient will follow up with Dr. Masami Plata Limbo in 3 weeks to discuss next steps.   #Neuropathy: -Likely secondary to treatment and not from the disease itself. -Recommended to continue to use cane/walker due to neuropathy in feet, especially with recent fall.  -Patient started outpatient rehab to improve muscle weakness.  -Will continue to hold Neurontin since causing depression and neuropathy not painful at this time.  All of the patient's questions were  answered with apparent satisfaction. The patient knows to call the clinic with any problems, questions or concerns.  I have spent a total of 25 minutes minutes of face-to-face and non-face-to-face time, preparing to see the patient, obtaining and/or reviewing separately obtained history, performing a medically appropriate examination, counseling and educating the patient,  documenting clinical information in the electronic health record, and care coordination.   Lincoln Brigham PA-C Hematology and Oncology [place] P: [phone]

## 2020-10-26 NOTE — Progress Notes (Signed)
No treatment today per Dede Query, PA-C.  Patient discharged with daughter present, no acute distress.

## 2020-10-27 ENCOUNTER — Encounter: Payer: Self-pay | Admitting: Hematology

## 2020-10-27 ENCOUNTER — Other Ambulatory Visit: Payer: Self-pay

## 2020-10-27 ENCOUNTER — Inpatient Hospital Stay: Payer: Medicare Other | Admitting: Dietician

## 2020-10-27 DIAGNOSIS — G629 Polyneuropathy, unspecified: Secondary | ICD-10-CM | POA: Insufficient documentation

## 2020-10-27 NOTE — Progress Notes (Signed)
Nutrition Follow-up:  Patient with angioimmunoblastic lymphoma. She is receiving Adcetris.   Spoke with patient and daughter via telephone. Patient reports doing well and  appetite is good. Patient says she is eating more but somehow has lost weight. Yesterday she had fresh salmon, broccoli, zucchini fries, hashbrowns, large slice of bread, 1 Ensure complete, and her "green drink" They day prior she reports green drink, 2 Ensure, and [place] burger with mac/cheese. Daughter reports patient has the kale/spinach made with nonfat greek yogurt drink as either her lunch or dinner meal. Patient reports occasionally she will have a little something to go with it like beets or peanut butter bread.   Medications: reviewed  Labs: 6/7- Glucose 109  Anthropometrics: Weight 108 lb 4 oz on 6/7 stable over the last 6 weeks  5/16 - 109 lb 11.2 oz 4/26 - 109 lb 1.6 oz 4/5 - 112 lb 14.4 oz 3/15 - 110 lb 14.4 oz   NUTRITION DIAGNOSIS: Food and nutrition related knowledge deficit continues   INTERVENTION:  Discussed adding high calorie, high protein snacks in between meals, offered ideas - will mail handout Discussed tips for adding calories - recommended switching to full fat yogurt in smoothie, using whole fat milks in soups, adding cheese to vegetables, cooking with butter Patient will drink 3 Ensure Complete daily - will mail coupons     MONITORING, EVALUATION, GOAL: weight trends, intake   NEXT VISIT: Monday June 27 in infusion

## 2020-10-29 ENCOUNTER — Encounter: Payer: Self-pay | Admitting: Hematology

## 2020-10-29 ENCOUNTER — Other Ambulatory Visit: Payer: Self-pay

## 2020-11-03 ENCOUNTER — Encounter: Payer: Self-pay | Admitting: Physical Therapy

## 2020-11-03 ENCOUNTER — Other Ambulatory Visit: Payer: Self-pay

## 2020-11-03 ENCOUNTER — Ambulatory Visit: Payer: Medicare Other | Attending: Hematology | Admitting: Physical Therapy

## 2020-11-03 DIAGNOSIS — M25561 Pain in right knee: Secondary | ICD-10-CM | POA: Insufficient documentation

## 2020-11-03 DIAGNOSIS — G8929 Other chronic pain: Secondary | ICD-10-CM | POA: Insufficient documentation

## 2020-11-03 DIAGNOSIS — R293 Abnormal posture: Secondary | ICD-10-CM

## 2020-11-03 DIAGNOSIS — M6281 Muscle weakness (generalized): Secondary | ICD-10-CM | POA: Insufficient documentation

## 2020-11-03 DIAGNOSIS — R2689 Other abnormalities of gait and mobility: Secondary | ICD-10-CM | POA: Insufficient documentation

## 2020-11-03 DIAGNOSIS — R262 Difficulty in walking, not elsewhere classified: Secondary | ICD-10-CM | POA: Insufficient documentation

## 2020-11-03 DIAGNOSIS — M25562 Pain in left knee: Secondary | ICD-10-CM | POA: Insufficient documentation

## 2020-11-03 DIAGNOSIS — R209 Unspecified disturbances of skin sensation: Secondary | ICD-10-CM

## 2020-11-03 DIAGNOSIS — Z9181 History of falling: Secondary | ICD-10-CM

## 2020-11-03 NOTE — Therapy (Signed)
[place], [place], [address] Phone: [phone]   Fax:  [phone]  Physical Therapy Treatment  Patient Details  Name: Sarah [name] MRN: [id number] Date of Birth: 09/25/1932 Referring Provider (PT): Dr. Irene Limbo   Encounter Date: 11/03/2020   PT End of Session - 11/03/20 1628     Visit Number 2    PT Start Time 1500    PT Stop Time 1600    PT Time Calculation (min) 60 min    Activity Tolerance Patient tolerated treatment well    Behavior During Therapy [facility] for tasks assessed/performed             Past Medical History:  Diagnosis Date   Allergy    codeine, thiazides   Anxiety    new dx   Arthritis    Breast cancer ([place]) 03/14/12   bx=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,ER/PR=+,upper inner quad   Cancer ([place])    breast   Cataract    DVT (deep venous thrombosis) ([place]) 02/2019   left leg   Dyspnea    Edema    both legs feet and toe, abdomen   Glaucoma    laser treated years ago   HOH (hard of hearing)    Hyperlipemia    Hypertension    Hypothyroidism    Pancreatic cyst    benign   PONV (postoperative nausea and vomiting)    Pulmonary embolism ([place]) 02/2019   bilateral    Radiation 06/11/2012-07/12/2012   17 sessions 4250 cGy, 3 sessions 750 cGy   Vertigo    Wears glasses    Wears partial dentures    partial upper    Past Surgical History:  Procedure Laterality Date   ABDOMINAL HYSTERECTOMY  1966   1/2 ovary left in    [place]   lumpectomy-lt   CATARACT EXTRACTION     b/l   COLONOSCOPY     EXCISION MASS NECK Left 03/17/2019    EXCISION MASS NECK (Left Neck)   EXCISION MASS NECK Left 03/17/2019   Procedure: EXCISION MASS NECK;  Surgeon: Helayne Seminole, MD;  Location: [place];  Service: ENT;  Laterality: Left;   EYE SURGERY Bilateral    bilateral cataract removal   IR IMAGING GUIDED PORT INSERTION  04/23/2019   JOINT REPLACEMENT   2013   rt total knee   JOINT REPLACEMENT  1995   lt total knee   PARTIAL MASTECTOMY WITH NEEDLE LOCALIZATION  04/09/2012   Procedure: PARTIAL MASTECTOMY WITH NEEDLE LOCALIZATION;  Surgeon: Adin Hector, MD;  Location: [place];  Service: General;  Laterality: Right;   SKIN BIOPSY Left 03/17/2019   LEFT THIGH   SKIN BIOPSY Left 03/17/2019   Procedure: Skin Biopsy Left Thigh;  Surgeon: Helayne Seminole, MD;  Location: [facility] OR;  Service: ENT;  Laterality: Left;   TONSILLECTOMY     TOTAL KNEE ARTHROPLASTY  08/16/2011   Procedure: TOTAL KNEE ARTHROPLASTY;  Surgeon: Ninetta Lights, MD;  Location: [place];  Service: Orthopedics;  Laterality: Right;    There were no vitals filed for this visit.   Subjective Assessment - 11/03/20 1618     Subjective Pt states her knees are achy.  She is having a neuropathy in her feet.  Daughter reports that she is having trouble getting up the stairs at night. Pt has had 2 episodes of lowering to the floor at night. Pt states she really didn't fall, but  she was unable to get up and had to call her daughter to lift her up. Patient and daugher are discussing other living arrangements.    Patient is accompained by: Family member   daughter Sarah [name]   Pertinent History pt with Angioimmunoblastic T cell lymphoma  with treatment starting  November 2020 and has had diffiuclty since. She gets chemo once every 3 weeks. Past history includes bilateral TKR., right breast cancer 03/14/2012    Patient Stated Goals to walk without a cane    Currently in Pain? No/denies                [facility] PT Assessment - 11/03/20 0001       Standardized Balance Assessment   Standardized Balance Assessment Timed Up and Go Test      Timed Up and Go Test   TUG Normal TUG    Normal TUG (seconds) 17.27                           [facility] Adult PT Treatment/Exercise - 11/03/20 0001       Ambulation/Gait   Ambulation/Gait Yes    Gait Comments tried  out trekking poles and pt was able to use them with good sequence although she has some difficulty advanceing with left arm as she usually carries her cane in her right hand. Pt needed cues to slow down, but she was able to walk pretty well with them Pt and daughter will consider getting them as an option      Exercises   Exercises Knee/Hip;Ankle;Lumbar;Other Exercises    Other Exercises  neck and upper thoracic range of motion as a warm up. Pt has limited active cervical motion      Lumbar Exercises: Standing   Heel Raises 10 reps    Heel Raises Limitations hands on counter    Functional Squats 10 reps    Functional Squats Limitations hands on counter    Other Standing Lumbar Exercises hip hinge x 10 with limited range and pt having to keep hands on counter due to fear of falling.  Needed cues for glute set at the top    Other Standing Lumbar Exercises from high mat, sit to stand x 5 with no weight, 3 #, 5# with cues to contol descent, the 5 reps with no weight and 5 # from slightly lower mat with cues to control descent   pt improved with each repetition, at times used knees pressed against back of mat to help with stability     Knee/Hip Exercises: Standing   Other Standing Knee Exercises leaning forward on armrests of chair for leg extension and bringing leg up to step on stool    Other Standing Knee Exercises pt able to do 5 armchair pushups      Knee/Hip Exercises: Supine   Straight Leg Raises Strengthening;Both;1 set;5 reps      Knee/Hip Exercises: Sidelying   Hip ABduction AROM;Both;1 set;10 reps      Ankle Exercises: Seated   Other Seated Ankle Exercises ankle dorsi and plantar flexion x 10 reps                    PT Education - 11/03/20 1627     Education Details showed pt and daughter ( and gave link for youtube video for fall recovery macguyver style to problem solve ways for her to get up from the floor at home.  Pt does not want to  roll over onto knees due to  knee replacements    Person(s) Educated Patient;Child(ren)    Methods Explanation;Demonstration;Other (comment)   video   Comprehension Need further instruction              PT Short Term Goals - 10/14/20 1647       PT SHORT TERM GOAL #1   Title Pt will be independent with HEP within 3 weeks in order to demonstrate autonomy of care.    Time 4    Period Weeks    Status Deferred      PT SHORT TERM GOAL #2   Title Pt will 3/5 dorsiflexion of left ankle to decrease risk of fall while walking    Baseline 2-/5 on 10/14/2020    Time 4    Period Weeks    Status New      PT SHORT TERM GOAL #3   Title Pt will demonstrate safe technique in getting up and down 8 steps using handrail as neede    Time 4    Period Weeks    Status New               PT Long Term Goals - 10/14/20 1648       PT LONG TERM GOAL #1   Title Pt wil increase sit to stand to 8 in 30 seconds indicating an increase in fuctional strengthe    Baseline 4 in 30 seconds    Time 8    Period Weeks    Status New      PT LONG TERM GOAL #2   Title Pt will decrease TUG time by 3 seconds indicating and decrease in risk for fall    Time 8    Period Weeks    Status New      PT LONG TERM GOAL #3   Title Pt will be able to walk 50 feet with cane and holding 5 pounds weight so that she safely do household chores.    Period Weeks    Status New      PT LONG TERM GOAL #4   Title Pt can walk 50 feet without the use of her cane so that she can safely walk across the rooms in her home    Time 8    Period Weeks    Status New                   Plan - 11/03/20 1629     Clinical Impression Statement Pt appears to be doing a little better with moblity than last session with better control of plantar and dorsiflexion.  She was able to perform strengthening exercises today with cues to slow down and contol descent from stand to sit.  She still has difficulty upon first getting to standing and has found herself  on the floor at home a couple of times without ability to get up. Daughter present and involved in problem solving. Began working on floor recovery strategies today and tried trekking poles as alternative for walking assitive device    Personal Factors and Comorbidities Comorbidity 3+    Comorbidities lymphoma with chemotherapy., bilateral TKR, osteopenia    Examination-Activity Limitations Stand;Stairs;Locomotion Level;Lift;Squat;Transfers    Examination-Participation Restrictions Volunteer;Church;Laundry    Stability/Clinical Decision Making Stable/Uncomplicated    Rehab Potential Good    PT Frequency 2x / week    PT Duration 8 weeks    PT Treatment/Interventions ADLs/Self Care Home Management;Moist Heat;Gait training;Stair training;DME Instruction;Functional mobility training;Therapeutic  activities;Therapeutic exercise;Balance training;Neuromuscular re-education;Patient/family education;Orthotic Fit/Training    PT Next Visit Plan strengthening to ankles, hips and kness iwith progressive weights,  practice stair training strategies,  teach HEP and progress    PT Home Exercise Plan standing ankle dorsi and plantar flexion    Consulted and Agree with Plan of Care Patient             Patient will benefit from skilled therapeutic intervention in order to improve the following deficits and impairments:     Visit Diagnosis: Muscle weakness (generalized)  Unspecified disturbances of skin sensation  History of falling  Abnormal posture  Abnormality of gait due to impairment of balance     Problem List Patient Active Problem List   Diagnosis Date Noted   Neuropathy 10/27/2020   Abnormal gait 06/22/2020   Age-related osteoporosis without current pathological fracture 06/22/2020   Anxiety 06/22/2020   Hardening of the aorta (main artery of the heart) ([place]) 06/22/2020   Malignant lymphoma, non-Hodgkin's ([place]) 06/22/2020   Mild cognitive disorder 06/22/2020   Non-pressure chronic  ulcer of unspecified part of unspecified lower leg with unspecified severity ([place]) 06/22/2020   Primary insomnia 06/22/2020   Pulmonary nodule 06/22/2020   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   Abnormal chest x-ray 12/31/2019   Cough 12/31/2019   Port-A-Cath in place 06/04/2019   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[place]) 04/14/2019   Counseling regarding advance care planning and goals of care 04/14/2019   Lymphadenopathy of head and neck 03/17/2019   Lymph nodes enlarged 03/17/2019   History of pulmonary embolus (PE) 03/17/2019   Acute pulmonary embolism ([place]) 03/06/2019   Diffuse lymphadenopathy 03/06/2019   Pleural effusion 03/06/2019   Pruritus 02/06/2019   Pruritic rash 02/06/2019   Angio-edema 02/06/2019   Shortness of breath 02/06/2019   Osteopenia 12/18/2013   Heart block AV first degree 12/18/2013   Breast cancer of upper-inner quadrant of right female breast ([place]) 03/19/2012   Right knee DJD 08/18/2011   Hypothyroidism    Hypertension    PONV (postoperative nausea and vomiting)    Hyperlipemia    Donato Heinz. Owens Shark PT  Norwood Levo 11/03/2020, 4:34 PM  [place], [place], [address] Phone: [phone]   Fax:  [phone]  Name: SANSA ALKEMA MRN: [id number] Date of Birth: [birth year]

## 2020-11-09 ENCOUNTER — Encounter: Payer: Self-pay | Admitting: Physical Therapy

## 2020-11-09 ENCOUNTER — Other Ambulatory Visit: Payer: Self-pay

## 2020-11-09 ENCOUNTER — Ambulatory Visit: Payer: Medicare Other | Admitting: Physical Therapy

## 2020-11-09 DIAGNOSIS — G8929 Other chronic pain: Secondary | ICD-10-CM | POA: Diagnosis not present

## 2020-11-09 DIAGNOSIS — R262 Difficulty in walking, not elsewhere classified: Secondary | ICD-10-CM | POA: Diagnosis not present

## 2020-11-09 DIAGNOSIS — M25561 Pain in right knee: Secondary | ICD-10-CM | POA: Diagnosis not present

## 2020-11-09 DIAGNOSIS — R209 Unspecified disturbances of skin sensation: Secondary | ICD-10-CM

## 2020-11-09 DIAGNOSIS — Z9181 History of falling: Secondary | ICD-10-CM | POA: Diagnosis not present

## 2020-11-09 DIAGNOSIS — M25562 Pain in left knee: Secondary | ICD-10-CM | POA: Diagnosis not present

## 2020-11-09 DIAGNOSIS — R2689 Other abnormalities of gait and mobility: Secondary | ICD-10-CM

## 2020-11-09 DIAGNOSIS — R293 Abnormal posture: Secondary | ICD-10-CM | POA: Diagnosis not present

## 2020-11-09 DIAGNOSIS — M6281 Muscle weakness (generalized): Secondary | ICD-10-CM

## 2020-11-09 NOTE — Therapy (Signed)
[place], [place], [address] Phone: [phone]   Fax:  [phone]  Physical Therapy Treatment  Patient Details  Name: Sarah [name] MRN: [id number] Date of Birth: Apr 18, 1933 Referring Provider (PT): Dr. Irene Limbo   Encounter Date: 11/09/2020   PT End of Session - 11/09/20 1731     Visit Number 3    Number of Visits 17    Date for PT Re-Evaluation 12/14/20    PT Start Time 1500    PT Stop Time 7824    PT Time Calculation (min) 45 min    Activity Tolerance Patient tolerated treatment well    Behavior During Therapy [facility] for tasks assessed/performed             Past Medical History:  Diagnosis Date   Allergy    codeine, thiazides   Anxiety    new dx   Arthritis    Breast cancer ([place]) 03/14/12   bx=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,ER/PR=+,upper inner quad   Cancer ([place])    breast   Cataract    DVT (deep venous thrombosis) ([place]) 02/2019   left leg   Dyspnea    Edema    both legs feet and toe, abdomen   Glaucoma    laser treated years ago   HOH (hard of hearing)    Hyperlipemia    Hypertension    Hypothyroidism    Pancreatic cyst    benign   PONV (postoperative nausea and vomiting)    Pulmonary embolism ([place]) 02/2019   bilateral    Radiation 06/11/2012-07/12/2012   17 sessions 4250 cGy, 3 sessions 750 cGy   Vertigo    Wears glasses    Wears partial dentures    partial upper    Past Surgical History:  Procedure Laterality Date   ABDOMINAL HYSTERECTOMY  1966   1/2 ovary left in    [place]   lumpectomy-lt   CATARACT EXTRACTION     b/l   COLONOSCOPY     EXCISION MASS NECK Left 03/17/2019    EXCISION MASS NECK (Left Neck)   EXCISION MASS NECK Left 03/17/2019   Procedure: EXCISION MASS NECK;  Surgeon: Helayne Seminole, MD;  Location: [place];  Service: ENT;  Laterality: Left;   EYE SURGERY Bilateral    bilateral cataract removal   IR  IMAGING GUIDED PORT INSERTION  04/23/2019   JOINT REPLACEMENT  2013   rt total knee   JOINT REPLACEMENT  1995   lt total knee   PARTIAL MASTECTOMY WITH NEEDLE LOCALIZATION  04/09/2012   Procedure: PARTIAL MASTECTOMY WITH NEEDLE LOCALIZATION;  Surgeon: Adin Hector, MD;  Location: [place];  Service: General;  Laterality: Right;   SKIN BIOPSY Left 03/17/2019   LEFT THIGH   SKIN BIOPSY Left 03/17/2019   Procedure: Skin Biopsy Left Thigh;  Surgeon: Helayne Seminole, MD;  Location: [facility] OR;  Service: ENT;  Laterality: Left;   TONSILLECTOMY     TOTAL KNEE ARTHROPLASTY  08/16/2011   Procedure: TOTAL KNEE ARTHROPLASTY;  Surgeon: Ninetta Lights, MD;  Location: [place];  Service: Orthopedics;  Laterality: Right;    There were no vitals filed for this visit.   Subjective Assessment - 11/09/20 1720     Subjective Pt states she has the hardest time standing up from a chair.  She is also concerned about wanting to drive again but knows she cannot because of the numbness in her feet.  She is also consdering other living situiations    Patient is accompained by: Family member   daughter Sarah [name]   Pertinent History pt with Angioimmunoblastic T cell lymphoma  with treatment starting  November 2020 and has had diffiuclty since. She gets chemo once every 3 weeks. Past history includes bilateral TKR., right breast cancer 03/14/2012    Patient Stated Goals to walk without a cane    Currently in Pain? No/denies                               [facility] Adult PT Treatment/Exercise - 11/09/20 0001       Ambulation/Gait   Ambulation/Gait Yes    Ambulation/Gait Assistance 5: Supervision    Assistive device Straight cane    Gait Pattern Step-to pattern   encouraged pt to slow down and to increase left arm swing   Pre-Gait Activities staggered stance weight shift with each leg foward holding onto countertop sideways    Gait Comments pt does not want to use trekking poles. She  carries cane in her right hand and has visible trouble with posture due to scoliosis and difficutly with placement of left leg and getting left knee straight as left leg is longer      Exercises   Exercises Other Exercises    Other Exercises  neck and upper thoracic range of motion as a warm up. 2 rounds of top down mini squat with 5 pound at chest, 1# shoulder raise to shelf, right mini lunge, 1# left arm raise to shelf, left leg mini lunge, 5 reps each  on the 30 second timer. Pt had difficutly with this and appeared fatigued after 2 rounds.      Knee/Hip Exercises: Standing   Heel Raises Both;3 sets;5 reps    Hip Abduction Right;Left;3 sets;5 reps    Forward Step Up Right;Left;3 sets;5 reps;Hand Hold: 1;Step Height: 4"    Other Standing Knee Exercises toe taps onto 4 inche step with one hand hold on counter      Knee/Hip Exercises: Seated   Sit to Sand 5 reps;without UE support   from elevated mat 5 reps with interval for unilateral shoulder raise with 1# x 5 reps                     PT Short Term Goals - 10/14/20 1647       PT SHORT TERM GOAL #1   Title Pt will be independent with HEP within 3 weeks in order to demonstrate autonomy of [name].    Time 4    Period Weeks    Status Deferred      PT SHORT TERM GOAL #2   Title Pt will 3/5 dorsiflexion of left ankle to decrease risk of fall while walking    Baseline 2-/5 on 10/14/2020    Time 4    Period Weeks    Status New      PT SHORT TERM GOAL #3   Title Pt will demonstrate safe technique in getting up and down 8 steps using handrail as neede    Time 4    Period Weeks    Status New               PT Long Term Goals - 10/14/20 1648       PT LONG TERM GOAL #1   Title Pt wil increase sit to stand to 8 in 30 seconds  indicating an increase in fuctional strengthe    Baseline 4 in 30 seconds    Time 8    Period Weeks    Status New      PT LONG TERM GOAL #2   Title Pt will decrease TUG time by 3 seconds  indicating and decrease in risk for fall    Time 8    Period Weeks    Status New      PT LONG TERM GOAL #3   Title Pt will be able to walk 50 feet with cane and holding 5 pounds weight so that she safely do household chores.    Period Weeks    Status New      PT LONG TERM GOAL #4   Title Pt can walk 50 feet without the use of her cane so that she can safely walk across the rooms in her home    Time 8    Period Weeks    Status New                   Plan - 11/09/20 1731     Clinical Impression Statement Worked on increasing exdercise today wtih rEMOM  on 30 sec.  Pt had difficutly with this and the timer added an element of urgency that caused her to fatigue easier.  She did better with sit to stand from higher mat and was able to add UE exercise intervals easier with no timer element.  Feel she will be able to do interval training designed to have easy transitions as she still has trouble with her balance and feel of falling.  She needs to have continued work on ankle strength too. ??? try shoe lift on right ?? to help with walking symmetry?    Personal Factors and Comorbidities Comorbidity 3+    Comorbidities lymphoma with chemotherapy., bilateral TKR, osteopenia    Examination-Activity Limitations Stand;Stairs;Locomotion Level;Lift;Squat;Transfers    Examination-Participation Restrictions Volunteer;Church;Laundry    Stability/Clinical Decision Making Stable/Uncomplicated    Rehab Potential Good    PT Frequency 2x / week    PT Duration 8 weeks    PT Treatment/Interventions ADLs/Self [name] Home Management;Moist Heat;Gait training;Stair training;DME Instruction;Functional mobility training;Therapeutic activities;Therapeutic exercise;Balance training;Neuromuscular re-education;Patient/family education;Orthotic Fit/Training    PT Next Visit Plan ??? try walking with no shoe on left and shoe on right to simulate shoe lift?? continue interval training strengthening to ankles, hips and  kness iwith progressive weights,  practice stair training strategies,  teach HEP and progress    PT Home Exercise Plan standing ankle dorsi and plantar flexion    Consulted and Agree with Plan of [name] Patient             Patient will benefit from skilled therapeutic intervention in order to improve the following deficits and impairments:  Abnormal gait, Decreased balance, Decreased endurance, Decreased mobility, Difficulty walking, Impaired sensation, Decreased activity tolerance, Decreased safety awareness, Decreased strength, Postural dysfunction, Pain  Visit Diagnosis: Muscle weakness (generalized)  Unspecified disturbances of skin sensation  History of falling  Abnormal posture  Difficulty in walking, not elsewhere classified  Balance disorder     Problem List Patient Active Problem List   Diagnosis Date Noted   Neuropathy 10/27/2020   Abnormal gait 06/22/2020   Age-related osteoporosis without current pathological fracture 06/22/2020   Anxiety 06/22/2020   Hardening of the aorta (main artery of the heart) ([place]) 06/22/2020   Malignant lymphoma, non-Hodgkin's ([place]) 06/22/2020   Mild cognitive disorder 06/22/2020  Non-pressure chronic ulcer of unspecified part of unspecified lower leg with unspecified severity ([place]) 06/22/2020   Primary insomnia 06/22/2020   Pulmonary nodule 06/22/2020   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   Abnormal chest x-ray 12/31/2019   Cough 12/31/2019   Port-A-Cath in place 06/04/2019   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[place]) 04/14/2019   Counseling regarding advance [name] planning and goals of [name] 04/14/2019   Lymphadenopathy of head and neck 03/17/2019   Lymph nodes enlarged 03/17/2019   History of pulmonary embolus (PE) 03/17/2019   Acute pulmonary embolism ([place]) 03/06/2019   Diffuse lymphadenopathy 03/06/2019   Pleural effusion 03/06/2019   Pruritus 02/06/2019   Pruritic rash 02/06/2019   Angio-edema 02/06/2019   Shortness of breath 02/06/2019    Osteopenia 12/18/2013   Heart block AV first degree 12/18/2013   Breast cancer of upper-inner quadrant of right female breast ([place]) 03/19/2012   Right knee DJD 08/18/2011   Hypothyroidism    Hypertension    PONV (postoperative nausea and vomiting)    Hyperlipemia    Donato Heinz. Owens Shark PT  Norwood Levo 11/09/2020, 5:36 PM  [place], [place], [address] Phone: [phone]   Fax:  [phone]  Name: Sarah [name] MRN: [id number] Date of Birth: 05-Jan-1933

## 2020-11-10 ENCOUNTER — Other Ambulatory Visit: Payer: Medicare Other

## 2020-11-10 ENCOUNTER — Ambulatory Visit: Payer: Medicare Other | Admitting: Hematology

## 2020-11-10 ENCOUNTER — Ambulatory Visit: Payer: Medicare Other

## 2020-11-11 ENCOUNTER — Encounter: Payer: Self-pay | Admitting: Physical Therapy

## 2020-11-11 ENCOUNTER — Other Ambulatory Visit: Payer: Self-pay

## 2020-11-11 ENCOUNTER — Ambulatory Visit: Payer: Medicare Other | Admitting: Physical Therapy

## 2020-11-11 DIAGNOSIS — R2689 Other abnormalities of gait and mobility: Secondary | ICD-10-CM

## 2020-11-11 DIAGNOSIS — M6281 Muscle weakness (generalized): Secondary | ICD-10-CM | POA: Diagnosis not present

## 2020-11-11 DIAGNOSIS — G8929 Other chronic pain: Secondary | ICD-10-CM

## 2020-11-11 DIAGNOSIS — R262 Difficulty in walking, not elsewhere classified: Secondary | ICD-10-CM

## 2020-11-11 DIAGNOSIS — R293 Abnormal posture: Secondary | ICD-10-CM

## 2020-11-11 DIAGNOSIS — Z9181 History of falling: Secondary | ICD-10-CM | POA: Diagnosis not present

## 2020-11-11 DIAGNOSIS — R209 Unspecified disturbances of skin sensation: Secondary | ICD-10-CM | POA: Diagnosis not present

## 2020-11-11 NOTE — Therapy (Signed)
[place], [place], [address] Phone: [phone]   Fax:  [phone]  Physical Therapy Treatment  Patient Details  Name: Sarah [name] MRN: [id number] Date of Birth: 02/26/1933 Referring Provider (PT): Dr. Irene Limbo   Encounter Date: 11/11/2020   PT End of Session - 11/11/20 1919     Visit Number 4    Number of Visits 17    Date for PT Re-Evaluation 12/14/20    PT Start Time 1500    PT Stop Time 7494    PT Time Calculation (min) 45 min    Activity Tolerance Patient tolerated treatment well    Behavior During Therapy [facility] for tasks assessed/performed             Past Medical History:  Diagnosis Date   Allergy    codeine, thiazides   Anxiety    new dx   Arthritis    Breast cancer ([place]) 03/14/12   bx=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,ER/PR=+,upper inner quad   Cancer ([place])    breast   Cataract    DVT (deep venous thrombosis) ([place]) 02/2019   left leg   Dyspnea    Edema    both legs feet and toe, abdomen   Glaucoma    laser treated years ago   HOH (hard of hearing)    Hyperlipemia    Hypertension    Hypothyroidism    Pancreatic cyst    benign   PONV (postoperative nausea and vomiting)    Pulmonary embolism ([place]) 02/2019   bilateral    Radiation 06/11/2012-07/12/2012   17 sessions 4250 cGy, 3 sessions 750 cGy   Vertigo    Wears glasses    Wears partial dentures    partial upper    Past Surgical History:  Procedure Laterality Date   ABDOMINAL HYSTERECTOMY  1966   1/2 ovary left in    [place]   lumpectomy-lt   CATARACT EXTRACTION     b/l   COLONOSCOPY     EXCISION MASS NECK Left 03/17/2019    EXCISION MASS NECK (Left Neck)   EXCISION MASS NECK Left 03/17/2019   Procedure: EXCISION MASS NECK;  Surgeon: Helayne Seminole, MD;  Location: [facility];  Service: ENT;  Laterality: Left;   EYE SURGERY Bilateral    bilateral cataract removal   IR  IMAGING GUIDED PORT INSERTION  04/23/2019   JOINT REPLACEMENT  2013   rt total knee   JOINT REPLACEMENT  1995   lt total knee   PARTIAL MASTECTOMY WITH NEEDLE LOCALIZATION  04/09/2012   Procedure: PARTIAL MASTECTOMY WITH NEEDLE LOCALIZATION;  Surgeon: Adin Hector, MD;  Location: [facility];  Service: General;  Laterality: Right;   SKIN BIOPSY Left 03/17/2019   LEFT THIGH   SKIN BIOPSY Left 03/17/2019   Procedure: Skin Biopsy Left Thigh;  Surgeon: Helayne Seminole, MD;  Location: [facility];  Service: ENT;  Laterality: Left;   TONSILLECTOMY     TOTAL KNEE ARTHROPLASTY  08/16/2011   Procedure: TOTAL KNEE ARTHROPLASTY;  Surgeon: Ninetta Lights, MD;  Location: [facility];  Service: Orthopedics;  Laterality: Right;    There were no vitals filed for this visit.   Subjective Assessment - 11/11/20 1901     Subjective Pt states that her knees have been achy, possibly from the exericise she did last week    Pertinent History pt with Angioimmunoblastic T cell lymphoma  with treatment starting  November  2020 and has had diffiuclty since. She gets chemo once every 3 weeks. Past history includes bilateral TKR., right breast cancer 03/14/2012    Patient Stated Goals to walk without a cane    Currently in Pain? Yes    Pain Score --   did not rate   Pain Location Knee    Pain Orientation Right;Left                               [facility] Adult PT Treatment/Exercise - 11/11/20 0001       Ambulation/Gait   Ambulation/Gait Yes    Ambulation/Gait Assistance 5: Supervision    Assistive device Straight cane    Gait Pattern Step-through pattern   pt encouraged to straighten left knee during gait   Stairs Yes    Stairs Assistance 5: Supervision    Stair Management Technique One rail Right    Number of Stairs 6    Gait Comments pt continues to be unsteady in gait.  she tends to keep left knee bent throughout . Pt with dyspnea after walking aobut 110 feet      High Level  Balance   High Level Balance Comments standing balance at counter to assess leg length evenness in standing.  With cues to keep both knees straight and stand up tall with hands on counter, pt had symmetrical pelvis.      Exercises   Exercises Knee/Hip;Lumbar;Shoulder      Lumbar Exercises: Standing   Heel Raises 10 reps    Heel Raises Limitations hands on counter    Other Standing Lumbar Exercises hip hinge with hands on counter    Other Standing Lumbar Exercises static standing with glute and quads engaged to keep knees straght in standing with hands on counter for support for 30 seconds      Lumbar Exercises: Supine   Clam 10 reps    Bridge 5 reps    Bridge Limitations cramping in hanstrings    Straight Leg Raise 10 reps    Other Supine Lumbar Exercises "windshield" for hip mobility, lower trunk rotation with ball between knees.      Lumbar Exercises: Sidelying   Clam Right;Left;5 reps    Hip Abduction Right;Left;5 reps    Other Sidelying Lumbar Exercises 5 reps on knee to chest      Shoulder Exercises: Supine   Other Supine Exercises chest press with 3 pounds x 5 reps                      PT Short Term Goals - 10/14/20 1647       PT SHORT TERM GOAL #1   Title Pt will be independent with HEP within 3 weeks in order to demonstrate autonomy of care.    Time 4    Period Weeks    Status Deferred      PT SHORT TERM GOAL #2   Title Pt will 3/5 dorsiflexion of left ankle to decrease risk of fall while walking    Baseline 2-/5 on 10/14/2020    Time 4    Period Weeks    Status New      PT SHORT TERM GOAL #3   Title Pt will demonstrate safe technique in getting up and down 8 steps using handrail as neede    Time 4    Period Weeks    Status New  PT Long Term Goals - 10/14/20 1648       PT LONG TERM GOAL #1   Title Pt wil increase sit to stand to 8 in 30 seconds indicating an increase in fuctional strengthe    Baseline 4 in 30 seconds     Time 8    Period Weeks    Status New      PT LONG TERM GOAL #2   Title Pt will decrease TUG time by 3 seconds indicating and decrease in risk for fall    Time 8    Period Weeks    Status New      PT LONG TERM GOAL #3   Title Pt will be able to walk 50 feet with cane and holding 5 pounds weight so that she safely do household chores.    Period Weeks    Status New      PT LONG TERM GOAL #4   Title Pt can walk 50 feet without the use of her cane so that she can safely walk across the rooms in her home    Time 8    Period Weeks    Status New                   Plan - 11/11/20 1919     Clinical Impression Statement Although pt appeard to have a leg length difference in and earlier visit, upon closer look today, legs appear to of equal length.  She does tend to keep left knee flexion during gait possibley to due leg weakness from previous TKR. And she has decreased standing balance possibly due to decreased sensation from nueropathy. Pt was able to perform strengthening exercises today with cues to focus on slowing down and moving with control  Problem solved stair safety with pt and daughter and they will consider placeing a safety bar at top of the steps. Encouraged pt to practice standing with both legs straight with LE muscles engaged at home.    Comorbidities lymphoma with chemotherapy., bilateral TKR, osteopenia    Examination-Activity Limitations Stand;Stairs;Locomotion Level;Lift;Squat;Transfers    Examination-Participation Restrictions Volunteer;Church;Laundry    Stability/Clinical Decision Making Stable/Uncomplicated    Rehab Potential Good    PT Frequency 2x / week    PT Treatment/Interventions ADLs/Self Care Home Management;Moist Heat;Gait training;Stair training;DME Instruction;Functional mobility training;Therapeutic activities;Therapeutic exercise;Balance training;Neuromuscular re-education;Patient/family education;Orthotic Fit/Training    PT Next Visit Plan work on  standing balance with knees straight  continue interval training strengthening to ankles, hips and kness iwith progressive weights,  practice stair training strategies,  teach HEP and progress    PT Home Exercise Plan standing ankle dorsi and plantar flexion    Consulted and Agree with Plan of Care Patient             Patient will benefit from skilled therapeutic intervention in order to improve the following deficits and impairments:  Abnormal gait, Decreased balance, Decreased endurance, Decreased mobility, Difficulty walking, Impaired sensation, Decreased activity tolerance, Decreased safety awareness, Decreased strength, Postural dysfunction, Pain  Visit Diagnosis: Muscle weakness (generalized)  Unspecified disturbances of skin sensation  History of falling  Abnormal posture  Difficulty in walking, not elsewhere classified  Balance disorder  Chronic pain of left knee  Chronic pain of right knee     Problem List Patient Active Problem List   Diagnosis Date Noted   Neuropathy 10/27/2020   Abnormal gait 06/22/2020   Age-related osteoporosis without current pathological fracture 06/22/2020   Anxiety 06/22/2020  Hardening of the aorta (main artery of the heart) ([place]) 06/22/2020   Malignant lymphoma, non-Hodgkin's ([place]) 06/22/2020   Mild cognitive disorder 06/22/2020   Non-pressure chronic ulcer of unspecified part of unspecified lower leg with unspecified severity ([place]) 06/22/2020   Primary insomnia 06/22/2020   Pulmonary nodule 06/22/2020   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   Abnormal chest x-ray 12/31/2019   Cough 12/31/2019   Port-A-Cath in place 06/04/2019   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[place]) 04/14/2019   Counseling regarding advance care planning and goals of care 04/14/2019   Lymphadenopathy of head and neck 03/17/2019   Lymph nodes enlarged 03/17/2019   History of pulmonary embolus (PE) 03/17/2019   Acute pulmonary embolism ([place]) 03/06/2019   Diffuse  lymphadenopathy 03/06/2019   Pleural effusion 03/06/2019   Pruritus 02/06/2019   Pruritic rash 02/06/2019   Angio-edema 02/06/2019   Shortness of breath 02/06/2019   Osteopenia 12/18/2013   Heart block AV first degree 12/18/2013   Breast cancer of upper-inner quadrant of right female breast ([place]) 03/19/2012   Right knee DJD 08/18/2011   Hypothyroidism    Hypertension    PONV (postoperative nausea and vomiting)    Hyperlipemia    Donato Heinz. Owens Shark PT  Norwood Levo 11/11/2020, 7:28 PM  [place], [place], [address] Phone: [phone]   Fax:  [phone]  Name: Sarah [name] MRN: [id number] Date of Birth: Apr 11, 1933

## 2020-11-15 ENCOUNTER — Inpatient Hospital Stay: Payer: Medicare Other | Admitting: Nutrition

## 2020-11-15 ENCOUNTER — Inpatient Hospital Stay: Payer: Medicare Other

## 2020-11-15 ENCOUNTER — Inpatient Hospital Stay: Payer: Medicare Other | Admitting: Hematology

## 2020-11-15 ENCOUNTER — Other Ambulatory Visit: Payer: Self-pay

## 2020-11-15 ENCOUNTER — Inpatient Hospital Stay (HOSPITAL_BASED_OUTPATIENT_CLINIC_OR_DEPARTMENT_OTHER): Payer: Medicare Other | Admitting: Physician Assistant

## 2020-11-15 VITALS — BP 143/72 | HR 75 | Temp 98.4°F | Resp 18 | Ht 66.0 in | Wt 108.9 lb

## 2020-11-15 DIAGNOSIS — C8440 Peripheral T-cell lymphoma, not classified, unspecified site: Secondary | ICD-10-CM

## 2020-11-15 DIAGNOSIS — I2699 Other pulmonary embolism without acute cor pulmonale: Secondary | ICD-10-CM | POA: Diagnosis not present

## 2020-11-15 DIAGNOSIS — G629 Polyneuropathy, unspecified: Secondary | ICD-10-CM | POA: Diagnosis not present

## 2020-11-15 DIAGNOSIS — R6889 Other general symptoms and signs: Secondary | ICD-10-CM | POA: Diagnosis not present

## 2020-11-15 DIAGNOSIS — R799 Abnormal finding of blood chemistry, unspecified: Secondary | ICD-10-CM

## 2020-11-15 DIAGNOSIS — E785 Hyperlipidemia, unspecified: Secondary | ICD-10-CM | POA: Diagnosis not present

## 2020-11-15 DIAGNOSIS — E039 Hypothyroidism, unspecified: Secondary | ICD-10-CM | POA: Diagnosis not present

## 2020-11-15 DIAGNOSIS — Z95828 Presence of other vascular implants and grafts: Secondary | ICD-10-CM

## 2020-11-15 DIAGNOSIS — Z5111 Encounter for antineoplastic chemotherapy: Secondary | ICD-10-CM

## 2020-11-15 DIAGNOSIS — Z7189 Other specified counseling: Secondary | ICD-10-CM

## 2020-11-15 DIAGNOSIS — C865 Angioimmunoblastic T-cell lymphoma: Secondary | ICD-10-CM | POA: Diagnosis not present

## 2020-11-15 DIAGNOSIS — E162 Hypoglycemia, unspecified: Secondary | ICD-10-CM | POA: Diagnosis not present

## 2020-11-15 LAB — CBC WITH DIFFERENTIAL/PLATELET
Abs Immature Granulocytes: 0.02 10*3/uL (ref 0.00–0.07)
Basophils Absolute: 0 10*3/uL (ref 0.0–0.1)
Basophils Relative: 1 %
Eosinophils Absolute: 0.1 10*3/uL (ref 0.0–0.5)
Eosinophils Relative: 2 %
HCT: 41.8 % (ref 36.0–46.0)
Hemoglobin: 14.1 g/dL (ref 12.0–15.0)
Immature Granulocytes: 1 %
Lymphocytes Relative: 17 %
Lymphs Abs: 0.7 10*3/uL (ref 0.7–4.0)
MCH: 30.8 pg (ref 26.0–34.0)
MCHC: 33.7 g/dL (ref 30.0–36.0)
MCV: 91.3 fL (ref 80.0–100.0)
Monocytes Absolute: 0.4 10*3/uL (ref 0.1–1.0)
Monocytes Relative: 10 %
Neutro Abs: 2.9 10*3/uL (ref 1.7–7.7)
Neutrophils Relative %: 69 %
Platelets: 162 10*3/uL (ref 150–400)
RBC: 4.58 MIL/uL (ref 3.87–5.11)
RDW: 14.5 % (ref 11.5–15.5)
WBC: 4.2 10*3/uL (ref 4.0–10.5)
nRBC: 0 % (ref 0.0–0.2)

## 2020-11-15 LAB — CMP (CANCER CENTER ONLY)
ALT: 14 U/L (ref 0–44)
AST: 23 U/L (ref 15–41)
Albumin: 4.1 g/dL (ref 3.5–5.0)
Alkaline Phosphatase: 71 U/L (ref 38–126)
Anion gap: 9 (ref 5–15)
BUN: 25 mg/dL — ABNORMAL HIGH (ref 8–23)
CO2: 27 mmol/L (ref 22–32)
Calcium: 10.5 mg/dL — ABNORMAL HIGH (ref 8.9–10.3)
Chloride: 107 mmol/L (ref 98–111)
Creatinine: 0.65 mg/dL (ref 0.44–1.00)
GFR, Estimated: >60 mL/min (ref >60)
Glucose, Bld: 83 mg/dL (ref 70–99)
Potassium: 4.1 mmol/L (ref 3.5–5.1)
Sodium: 143 mmol/L (ref 135–145)
Total Bilirubin: 0.6 mg/dL (ref 0.3–1.2)
Total Protein: 7.1 g/dL (ref 6.5–8.1)

## 2020-11-15 LAB — VITAMIN B12: Vitamin B-12: 7321 pg/mL — ABNORMAL HIGH (ref 180–914)

## 2020-11-15 LAB — LACTATE DEHYDROGENASE: LDH: 283 U/L — ABNORMAL HIGH (ref 98–192)

## 2020-11-15 MED ORDER — PROCHLORPERAZINE MALEATE 10 MG PO TABS: 10.0000 mg | ORAL_TABLET | Freq: Four times a day (QID) | ORAL | 1 refills | Status: DC | PRN

## 2020-11-15 MED ORDER — SODIUM CHLORIDE 0.9% FLUSH
10.0000 mL | Freq: Once | INTRAVENOUS | Status: AC
  Administered 2020-11-15: 10 mL
  Filled 2020-11-15: qty 10

## 2020-11-15 NOTE — Progress Notes (Signed)
HEMATOLOGY/ONCOLOGY CLINIC NOTE  Date of Service: 11/15/2020  Patient Care Team: Lajean Manes, MD as PCP - General (Internal Medicine) Buford Dresser, MD as PCP - Cardiology (Cardiology)  REFERRING PHYSICIAN: Lajean Manes, MD  CHIEF COMPLAINT: Angioimmunoblastic T cell lymphoma     CURRENT TREATMENT: Brentuximab, currently on hold due to neuropathy.   INTERVAL HISTORY:  Sarah [name] is a 87 y.o. female here for evaluation and management of Angioimmunoblastic T-cell lymphoma. Last visit with [place] was on 10/26/2020. She is accompanied by her daughter for this visit. Patient reports that energy and appetite are fairly stable. She tries to supplement her diet with protein shakes 1-2 times per day. She reports intermittent episodes of nausea that improves with drinking ginger ale. She denies vomiting or abdominal pain. Her bowel movements are regular without any diarrhea or constipation. Patient continues to experience persistent neuropathy in her feet, which has not improved the last visit. She reports that neuropathy does interfere with her balance. She feels like she is walking on glass or leather. She is unable to drive at this time due to difficulty feeling the pedals. She denies any recent falls since the last visit.. Patient uses a cane to ambulate. Patient denies any fevers, chills, night sweats, shortness of breath, chest pain or cough. She has no other complaints.  MEDICAL HISTORY:  Past Medical History:  Diagnosis Date   Allergy    codeine, thiazides   Anxiety    new dx   Arthritis    Breast cancer ([place]) 03/14/12   bx=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,ER/PR=+,upper inner quad   Cancer ([facility])    breast   Cataract    DVT (deep venous thrombosis) ([place]) 02/2019   left leg   Dyspnea    Edema    both legs feet and toe, abdomen   Glaucoma    laser treated years ago   HOH (hard of hearing)    Hyperlipemia    Hypertension    Hypothyroidism     Pancreatic cyst    benign   PONV (postoperative nausea and vomiting)    Pulmonary embolism ([place]) 02/2019   bilateral    Radiation 06/11/2012-07/12/2012   17 sessions 4250 cGy, 3 sessions 750 cGy   Vertigo    Wears glasses    Wears partial dentures    partial upper     SURGICAL HISTORY: Past Surgical History:  Procedure Laterality Date   ABDOMINAL HYSTERECTOMY  1966   1/2 ovary left in    [place]   lumpectomy-lt   CATARACT EXTRACTION     b/l   COLONOSCOPY     EXCISION MASS NECK Left 03/17/2019    EXCISION MASS NECK (Left Neck)   EXCISION MASS NECK Left 03/17/2019   Procedure: EXCISION MASS NECK;  Surgeon: Helayne Seminole, MD;  Location: [facility];  Service: ENT;  Laterality: Left;   EYE SURGERY Bilateral    bilateral cataract removal   IR IMAGING GUIDED PORT INSERTION  04/23/2019   JOINT REPLACEMENT  2013   rt total knee   JOINT REPLACEMENT  1995   lt total knee   PARTIAL MASTECTOMY WITH NEEDLE LOCALIZATION  04/09/2012   Procedure: PARTIAL MASTECTOMY WITH NEEDLE LOCALIZATION;  Surgeon: Adin Hector, MD;  Location: [facility];  Service: General;  Laterality: Right;   SKIN BIOPSY Left 03/17/2019   LEFT THIGH   SKIN BIOPSY Left 03/17/2019   Procedure: Skin Biopsy Left Thigh;  Surgeon: Lind Guest  J, MD;  Location: [facility];  Service: ENT;  Laterality: Left;   TONSILLECTOMY     TOTAL KNEE ARTHROPLASTY  08/16/2011   Procedure: TOTAL KNEE ARTHROPLASTY;  Surgeon: Ninetta Lights, MD;  Location: [facility];  Service: Orthopedics;  Laterality: Right;     SOCIAL HISTORY: Social History   Socioeconomic History   Marital status: Widowed    Spouse name: Not on file   Number of children: 2   Years of education: Not on file   Highest education level: Not on file  Occupational History   Occupation: Retired    Comment: Community education officer   Tobacco Use   Smoking status: Never   Smokeless tobacco: Never  Vaping Use   Vaping Use: Never used   Substance and Sexual Activity   Alcohol use: No   Drug use: No   Sexual activity: Not Currently  Other Topics Concern   Not on file  Social History Narrative   Not on file   Social Determinants of Health   Financial Resource Strain: Not on file  Food Insecurity: Not on file  Transportation Needs: Not on file  Physical Activity: Not on file  Stress: Not on file  Social Connections: Not on file  Intimate Partner Violence: Not on file     FAMILY HISTORY: Family History  Problem Relation Age of Onset   Heart disease Father    Cancer Maternal Aunt        stomach     ALLERGIES:   is allergic to citalopram, alendronate sodium, amoxicillin-pot clavulanate, codeine, latex, lisinopril, septra [sulfamethoxazole-trimethoprim], and thiazide-type diuretics.   MEDICATIONS:  Current Outpatient Medications  Medication Sig Dispense Refill   amLODipine (NORVASC) 2.5 MG tablet Take 2.5-5 mg by mouth See admin instructions. 5 mg in the morning, 2.5 mg in the evening     amoxicillin (AMOXIL) 500 MG capsule Take 4 capsules (2,000 mg total) by mouth once as needed for up to 1 dose. 60 mins prior to dental procedure (Patient not taking: Reported on 10/14/2020) 4 capsule 0   apixaban (ELIQUIS) 2.5 MG TABS tablet Take 1 tablet (2.5 mg total) by mouth 2 (two) times daily. 60 tablet 5   benzonatate (TESSALON) 100 MG capsule Take 1 capsule (100 mg total) by mouth 3 (three) times daily as needed for cough. (Patient not taking: Reported on 10/14/2020) 30 capsule 0   Cholecalciferol (VITAMIN D) 50 MCG (2000 UT) tablet Take 2,000 Units by mouth 2 (two) times daily.     clonazepam (KLONOPIN) 0.125 MG disintegrating tablet Take 1 tablet (0.125 mg total) by mouth 2 (two) times daily. (Patient not taking: Reported on 10/14/2020) 60 tablet 0   Cyanocobalamin (VITAMIN B-12 PO) Take 3,000 mcg by mouth daily.      gabapentin (NEURONTIN) 100 MG capsule Take 2 capsules (200 mg total) by mouth at bedtime. (Patient not  taking: Reported on 10/14/2020) 60 capsule 2   levothyroxine (SYNTHROID) 25 MCG tablet Take 25 mcg by mouth daily before breakfast.      lidocaine-prilocaine (EMLA) cream Apply to affected area once 30 g 3   LORazepam (ATIVAN) 0.5 MG tablet TAKE 1/2 (ONE-HALF) TO 1 TABLET BY MOUTH EVERY 8 HOURS 30 tablet 0   Multiple Vitamins-Minerals (PRESERVISION AREDS PO) Take 1 capsule by mouth 2 (two) times daily.      ondansetron (ZOFRAN) 8 MG tablet Take 1 tablet (8 mg total) by mouth 2 (two) times daily as needed. Start on the third day after chemotherapy. (Patient not  taking: Reported on 10/14/2020) 30 tablet 1   polyethylene glycol (MIRALAX / GLYCOLAX) 17 g packet Take 17 g by mouth daily. (Patient not taking: Reported on 10/14/2020)     prochlorperazine (COMPAZINE) 10 MG tablet Take 1 tablet (10 mg total) by mouth every 6 (six) hours as needed (Nausea or vomiting). (Patient not taking: Reported on 10/14/2020) 30 tablet 1   sodium chloride (MURO 128) 5 % ophthalmic ointment Place 1 application into both eyes at bedtime.     XIIDRA 5 % SOLN Place 1 drop into both eyes daily as needed (dry eyes).  (Patient not taking: Reported on 10/14/2020)     zolpidem (AMBIEN) 5 MG tablet Take 5 mg by mouth at bedtime as needed for sleep.      No current facility-administered medications for this visit.     Review of Systems  Constitutional:  Negative for chills and fever.  Eyes: Negative.   Respiratory:  Negative for cough and shortness of breath.   Cardiovascular:  Negative for chest pain, palpitations and leg swelling.  Gastrointestinal:  Negative for abdominal pain, blood in stool, constipation, diarrhea, melena, nausea and vomiting.  Musculoskeletal:  Positive for falls.  Skin:  Negative for itching and rash.  Neurological:  Negative for dizziness, focal weakness and headaches.  Endo/Heme/Allergies:  Does not bruise/bleed easily.    PHYSICAL EXAMINATION: ECOG FS:1 - Symptomatic but completely  ambulatory  Vitals:   11/15/20 1045  BP: (!) 143/72  Pulse: 75  Resp: 18  Temp: 98.4 F (36.9 C)  SpO2: 99%   Wt Readings from Last 3 Encounters:  11/15/20 108 lb 14.4 oz (49.4 kg)  10/26/20 108 lb 4 oz (49.1 kg)  10/04/20 109 lb 11.2 oz (49.8 kg)   Body mass index is 17.58 kg/m.    Exam was given in a chair.  GENERAL:alert, in no acute distress and comfortable SKIN: no acute rashes, no significant lesions EYES: conjunctiva are pink and non-injected, sclera anicteric LUNGS: clear to auscultation b/l with normal respiratory effort HEART: regular rate & rhythm ABDOMEN:  normoactive bowel sounds , non tender, not distended. Extremity: no pedal edema PSYCH: alert & oriented x 3 with fluent speech NEURO: no focal motor/sensory deficits   LABORATORY DATA:  I have reviewed the data as listed  CBC Latest Ref Rng & Units 11/15/2020 10/26/2020 10/04/2020  WBC 4.0 - 10.5 K/uL 4.2 4.5 3.3(L)  Hemoglobin 12.0 - 15.0 g/dL 14.1 14.1 13.7  Hematocrit 36.0 - 46.0 % 41.8 42.5 40.7  Platelets 150 - 400 K/uL 162 119(L) 184    CMP Latest Ref Rng & Units 10/26/2020 10/04/2020 09/14/2020  Glucose 70 - 99 mg/dL 109(H) 84 122(H)  BUN 8 - 23 mg/dL 22 21 24(H)  Creatinine 0.44 - 1.00 mg/dL 0.78 0.65 0.80  Sodium 135 - 145 mmol/L 143 142 143  Potassium 3.5 - 5.1 mmol/L 3.8 3.9 4.2  Chloride 98 - 111 mmol/L 105 106 106  CO2 22 - 32 mmol/L 27 29 29   Calcium 8.9 - 10.3 mg/dL 9.9 9.7 9.7  Total Protein 6.5 - 8.1 g/dL 6.8 6.6 6.9  Total Bilirubin 0.3 - 1.2 mg/dL 0.5 0.5 0.6  Alkaline Phos 38 - 126 U/L 71 70 73  AST 15 - 41 U/L 20 20 20   ALT 0 - 44 U/L 14 14 13    . Lab Results  Component Value Date   LDH 262 (H) 10/26/2020     . Lab Results  Component Value Date   LDH 262 (  H) 10/26/2020   RADIOGRAPHIC STUDIES: I have personally reviewed the radiological images as listed and agreed with the findings in the report. No results found.   ASSESSMENT & PLAN:   KAMY POINSETT is a 87 y.o.  female with:  1. 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 SKIN, LEFT THIGH, BIOPSY: - Involvement by angioimmunoblastic T-cell lymphoma." -04/07/2019 Echocardiogram - normal EF -04/08/2019 NM PET Image Initial (PI) Skull Base To Thigh (Accession [id number]) revealed "1. Widespread hypermetabolic adenopathy in the neck, chest, and abdomen/pelvis, primarily in the Deauville 4 and Deauville 5 range. There is also hypermetabolic activity along the posterior nasopharynx in lingual tonsillar regions potentially indicating sites of involvement. 2. The subtle hypodensities in the spine and bony pelvis are not appreciably hypermetabolic may be incidental, surveillance is suggested. 3. Bilateral thyroid activity but especially on the left. Probably from thyroiditis. 4. A 6 mm subpleural nodule anteriorly in the right lower lobe not appreciably hypermetabolic but is below sensitive PET-CT size thresholds and merits surveillance. 5. Mildly accentuated diffuse splenic activity without splenomegaly or focal splenic lesion identified. 6. Other imaging findings of potential clinical significance: Aortic Atherosclerosis (ICD10-I70.0). Coronary atherosclerosis. Large right and small left pleural effusions. Moderate cardiomegaly. Small pericardial effusion. Mild mesenteric edema. Large left renal cyst. Fullness of both renal collecting systems with suspected nonobstructive left nephrolithiasis. Degenerative grade 1 anterolisthesis at L4-5 at L5-S1. Trace pelvic ascites." -05/28/2019 PET/CT ([id number]) revealed "Complete metabolic response to therapy". -09/16/2019 PET/CT ([id number]) revealed "1. Unfortunately evidence of lymphoma recurrence at sites of previous hypermetabolic adenopathy. Hypermetabolic lymphoid tissue is new from comparison PET-CT 05/28/2019 but at  same locations as PET-CT 04/08/2019. The number of metastatic lymph nodes is less than 04/08/2019. Lymphoid tissue at the RIGHT skull base is larger. 2. Three foci of hypermetabolic recurrence are present, lymphoid tissue at the RIGHT base of skull, hypermetabolic RIGHT hilar lymph node and hypermetabolic lymph node in the porta hepatis." -11/25/2019 PET/CT ([id number]) revealed "1. Marked interval improvement in the hypermetabolic disease identified previously in the posterior right nasopharynx, compatible with Deauville 4 today. 2. Decreased hypermetabolism in the right hilar and porta hepatis lymph nodes identified previously (Deauville 3). No new sites of hypermetabolic disease in the chest, abdomen, or pelvis. 3. Similar diffuse thyroid uptake, left greater than right. 4. No substantial change in right-sided pulmonary nodules." -04/09/2020 PET/CT ([id number]) revealed "No evidence progressive lymphoma."  2. Pulmonary Embolism -extensive likely related to extensive malignancy  3. H/o Breast Cancer  -The patient had bilateral diagnostic  mammography [facility] 07/11/2011. This  showed some calcifications in the right  breast, which seemed a little bit more  prominent than prior. In the left breast  there was an area of possible  architectural distortion. However left  breast ultrasound the same day showed  no abnormality. 6 month followup was  suggested, and on 02/28/2012 the  patient again had bilateral diagnostic  mammography, now with right  ultrasonography. The microcalcifications  in the right breast appeared increased.  Ultrasound showed a hypoechoic lesion  measuring 5 mm, with no associated  shadowing. This had been previously  noted and appeared unchanged. Anterior  to that there was a small hypoechoic  mass measuring 4 mm in diameter.  There was felt to be suspicious, and on  03/14/2012 the patient underwent biopsy  of the right breast mass, showing (SAA  [id number]) ductal carcinoma in situ,   intermediate  grade, estrogen receptor  100% and progesterone receptor 100%  positive.    -Bilateral breast MRI was obtained  03/26/2012. This showed only post  biopsy changes in the right breast,  associated with an area of non-masslike  enhancement measuring 2.4 cm. The  left breast was unremarkable, and there  was no enlarged axillary or internal  mammary adenopathy noted.  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 The patient's  subsequent history is as detailed below.   PLAN: # Angioimmunoblastic lymphoma: -Currently on brentuximab, last dose on 09/14/2020 -Reviewed lab results from today, 11/15/2020. CBC was unremarkable. Calcium level was mildly above normal at 10.5.  -No lab or clinical evidence of progression/recurrence at this time.  -Due to persistent neuropathy that has not improved with holding Brentixumab, recommend not to proceed with treatment today. Patient will follow up with Dr. Reylene Stauder Limbo in 3 weeks to discuss next steps.   #Neuropathy: -Likely secondary to treatment and not from the disease itself. -Recommended to continue to use cane/walker due to neuropathy in feet, especially with recent fall.  -Patient started outpatient rehab to improve muscle weakness.  -Will continue to hold Neurontin since causing depression and neuropathy not painful at this time. -will check vitamin B12 and hemoglobin A1C to rule out other causes for neuropathy.   All of the patient's questions were answered with apparent satisfaction. The patient knows to call the clinic with any problems, questions or concerns.  I have spent a total of 25 minutes minutes of face-to-face and non-face-to-face time, preparing to see the patient, obtaining and/or reviewing separately obtained history, performing a medically appropriate examination, counseling  and educating the patient,  documenting clinical information in the electronic health record, and care coordination.   Lincoln Brigham PA-C Hematology and Oncology [facility] [place] P: [phone]  I have read the above note and personally examined the patient. I agree with the assessment and plan as noted above.  Briefly Mrs. Lobb is an 87 year old female with medical history significant for Angioimmunoblastic T cell lymphoma currently on brentuximab therapy who presents for consideration of restarting therapy.  The patient continues to have severe significant neuropathy and therefore I recommend holding the rituximab therapy at this time with reassessment in about 3 weeks with her primary oncologist.  The patient voiced understanding of this plan moving forward.   Ledell Peoples, MD Department of Hematology/Oncology [facility] Phone: [phone] Pager: [phone] Email: [email or web address]

## 2020-11-16 ENCOUNTER — Telehealth: Payer: Self-pay | Admitting: *Deleted

## 2020-11-16 NOTE — Telephone Encounter (Signed)
Response received from [place] about Vitamin B 12 levels and attempted to reach patients daughter to give instruction on decreasing dose of Vitamin B12.   Left message pending call back.

## 2020-11-16 NOTE — Telephone Encounter (Signed)
Patients daughter called [name]), stated her mother had labs yesterday in regards to her Neuropathy.  They evaluted her Vitamin B12 levels and those results are extremely evaluated.   Need guidance as patient is currently taking Vitamin B 12 3,000 mcg daily.  Routed to Dr Irene Limbo and Murray Hodgkins ( in his absence) to advise.  Requesting a call back.

## 2020-11-17 LAB — HEMOGLOBIN A1C
Hgb A1c MFr Bld: 5.1 % (ref 4.8–5.6)
Mean Plasma Glucose: 100 mg/dL

## 2020-11-22 ENCOUNTER — Other Ambulatory Visit: Payer: Self-pay | Admitting: Hematology

## 2020-11-22 DIAGNOSIS — C8440 Peripheral T-cell lymphoma, not classified, unspecified site: Secondary | ICD-10-CM

## 2020-11-23 ENCOUNTER — Encounter: Payer: Self-pay | Admitting: Hematology

## 2020-11-23 NOTE — Telephone Encounter (Signed)
Please review for refill.  

## 2020-11-24 ENCOUNTER — Other Ambulatory Visit: Payer: Self-pay

## 2020-11-24 ENCOUNTER — Ambulatory Visit: Payer: Medicare Other | Attending: Hematology | Admitting: Physical Therapy

## 2020-11-24 ENCOUNTER — Telehealth: Payer: Self-pay | Admitting: Hematology

## 2020-11-24 DIAGNOSIS — Z9181 History of falling: Secondary | ICD-10-CM | POA: Insufficient documentation

## 2020-11-24 DIAGNOSIS — M25562 Pain in left knee: Secondary | ICD-10-CM | POA: Insufficient documentation

## 2020-11-24 DIAGNOSIS — R2689 Other abnormalities of gait and mobility: Secondary | ICD-10-CM | POA: Insufficient documentation

## 2020-11-24 DIAGNOSIS — G8929 Other chronic pain: Secondary | ICD-10-CM | POA: Diagnosis not present

## 2020-11-24 DIAGNOSIS — M25561 Pain in right knee: Secondary | ICD-10-CM | POA: Insufficient documentation

## 2020-11-24 DIAGNOSIS — R293 Abnormal posture: Secondary | ICD-10-CM | POA: Diagnosis not present

## 2020-11-24 DIAGNOSIS — R209 Unspecified disturbances of skin sensation: Secondary | ICD-10-CM | POA: Insufficient documentation

## 2020-11-24 DIAGNOSIS — R262 Difficulty in walking, not elsewhere classified: Secondary | ICD-10-CM | POA: Diagnosis not present

## 2020-11-24 DIAGNOSIS — M6281 Muscle weakness (generalized): Secondary | ICD-10-CM | POA: Insufficient documentation

## 2020-11-24 NOTE — Telephone Encounter (Signed)
Sch per 06/28 los, pt daughter aware

## 2020-11-24 NOTE — Therapy (Signed)
[place], [place], [address] Phone: [phone]   Fax:  [phone]  Physical Therapy Treatment  Patient Details  Name: Sarah [name] MRN: [id number] Date of Birth: 1932-08-23 Referring Provider (PT): Dr. Irene Limbo   Encounter Date: 11/24/2020   PT End of Session - 11/24/20 1611     Visit Number 5    Number of Visits 17    Date for PT Re-Evaluation 12/14/20    PT Start Time 1400    PT Stop Time 2229    PT Time Calculation (min) 45 min    Activity Tolerance Patient tolerated treatment well    Behavior During Therapy [facility] for tasks assessed/performed             Past Medical History:  Diagnosis Date   Allergy    codeine, thiazides   Anxiety    new dx   Arthritis    Breast cancer ([place]) 03/14/12   bx=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,ER/PR=+,upper inner quad   Cancer ([place])    breast   Cataract    DVT (deep venous thrombosis) ([place]) 02/2019   left leg   Dyspnea    Edema    both legs feet and toe, abdomen   Glaucoma    laser treated years ago   HOH (hard of hearing)    Hyperlipemia    Hypertension    Hypothyroidism    Pancreatic cyst    benign   PONV (postoperative nausea and vomiting)    Pulmonary embolism ([place]) 02/2019   bilateral    Radiation 06/11/2012-07/12/2012   17 sessions 4250 cGy, 3 sessions 750 cGy   Vertigo    Wears glasses    Wears partial dentures    partial upper    Past Surgical History:  Procedure Laterality Date   ABDOMINAL HYSTERECTOMY  1966   1/2 ovary left in    [place]   lumpectomy-lt   CATARACT EXTRACTION     b/l   COLONOSCOPY     EXCISION MASS NECK Left 03/17/2019    EXCISION MASS NECK (Left Neck)   EXCISION MASS NECK Left 03/17/2019   Procedure: EXCISION MASS NECK;  Surgeon: Helayne Seminole, MD;  Location: [place];  Service: ENT;  Laterality: Left;   EYE SURGERY Bilateral    bilateral cataract removal   IR  IMAGING GUIDED PORT INSERTION  04/23/2019   JOINT REPLACEMENT  2013   rt total knee   JOINT REPLACEMENT  1995   lt total knee   PARTIAL MASTECTOMY WITH NEEDLE LOCALIZATION  04/09/2012   Procedure: PARTIAL MASTECTOMY WITH NEEDLE LOCALIZATION;  Surgeon: Adin Hector, MD;  Location: [place];  Service: General;  Laterality: Right;   SKIN BIOPSY Left 03/17/2019   LEFT THIGH   SKIN BIOPSY Left 03/17/2019   Procedure: Skin Biopsy Left Thigh;  Surgeon: Helayne Seminole, MD;  Location: [facility];  Service: ENT;  Laterality: Left;   TONSILLECTOMY     TOTAL KNEE ARTHROPLASTY  08/16/2011   Procedure: TOTAL KNEE ARTHROPLASTY;  Surgeon: Ninetta Lights, MD;  Location: [place] [facility];  Service: Orthopedics;  Laterality: Right;    There were no vitals filed for this visit.   Subjective Assessment - 11/24/20 1409     Subjective Pt said that she had a birthday  since last visit    Patient is accompained by: Family member    Pertinent History pt with Angioimmunoblastic T cell lymphoma  with  treatment starting  November 2020 and has had diffiuclty since. She gets chemo once every 3 weeks. Past history includes bilateral TKR., right breast cancer 03/14/2012    Patient Stated Goals to walk without a cane    Currently in Pain? No/denies                [facility] PT Assessment - 11/24/20 0001       Observation/Other Assessments   Observations Pt states she was feeling "woozy"  O2 sats:96, HR 74, BP 118/74                           [facility] Adult PT Treatment/Exercise - 11/24/20 0001       Ambulation/Gait   Ambulation/Gait Yes    Ambulation/Gait Assistance 5: Supervision    Ambulation Distance (Feet) 100 Feet   2 reps   Assistive device Straight cane    Gait Pattern Step-to pattern    Gait Comments focuses on slowing down, standing up straight with hips and knees straight , good heel strike.      Exercises   Exercises Other Exercises    Other Exercises  standing at  counter for 2 rounds of 5 reps of weight shift side to side, 1# weight to shelf with each hand, stagger stance weight shift with each leg forward, both hands tap on cabinet, glute sets,   after rest, 3 rounds of arm chair push up to stand, "shut the cardoor" to glute set, one arm overhead push with one pound with each arm, slow descent back to sit in chair.                   PT Education - 11/24/20 1611     Education Details for home program, do standing weight shift focusing on feeling muscles of legs activate    Person(s) Educated Patient    Methods Explanation;Demonstration    Comprehension Returned demonstration              PT Short Term Goals - 10/14/20 1647       PT SHORT TERM GOAL #1   Title Pt will be independent with HEP within 3 weeks in order to demonstrate autonomy of care.    Time 4    Period Weeks    Status Deferred      PT SHORT TERM GOAL #2   Title Pt will 3/5 dorsiflexion of left ankle to decrease risk of fall while walking    Baseline 2-/5 on 10/14/2020    Time 4    Period Weeks    Status New      PT SHORT TERM GOAL #3   Title Pt will demonstrate safe technique in getting up and down 8 steps using handrail as neede    Time 4    Period Weeks    Status New               PT Long Term Goals - 10/14/20 1648       PT LONG TERM GOAL #1   Title Pt wil increase sit to stand to 8 in 30 seconds indicating an increase in fuctional strengthe    Baseline 4 in 30 seconds    Time 8    Period Weeks    Status New      PT LONG TERM GOAL #2   Title Pt will decrease TUG time by 3 seconds indicating and decrease in risk for fall  Time 8    Period Weeks    Status New      PT LONG TERM GOAL #3   Title Pt will be able to walk 50 feet with cane and holding 5 pounds weight so that she safely do household chores.    Period Weeks    Status New      PT LONG TERM GOAL #4   Title Pt can walk 50 feet without the use of her cane so that she can safely  walk across the rooms in her home    Time 8    Period Weeks    Status New                   Plan - 11/24/20 1612     Clinical Impression Statement Pt did better today following commands in PT and adjusting body position to correct posture.  She was able to do standing exercises for 8 minutes without a rest break.  She was better ablet to control her gait speed and posture although this is still difficult for her. Good work in session today !    Personal Factors and Comorbidities Comorbidity 3+    Comorbidities lymphoma with chemotherapy., bilateral TKR, osteopenia    Examination-Activity Limitations Stand;Stairs;Locomotion Level;Lift;Squat;Transfers    Examination-Participation Restrictions Volunteer;Church;Laundry    Stability/Clinical Decision Making Stable/Uncomplicated    Rehab Potential Good    PT Frequency 2x / week    PT Duration 8 weeks    PT Treatment/Interventions ADLs/Self Care Home Management;Moist Heat;Gait training;Stair training;DME Instruction;Functional mobility training;Therapeutic activities;Therapeutic exercise;Balance training;Neuromuscular re-education;Patient/family education;Orthotic Fit/Training    PT Next Visit Plan work on standing balance with knees straight  continue interval training strengthening to ankles, hips and kness iwith progressive weights,  practice stair training strategies,  teach HEP and progress    PT Home Exercise Plan standing ankle dorsi and plantar flexion    Consulted and Agree with Plan of Care Patient             Patient will benefit from skilled therapeutic intervention in order to improve the following deficits and impairments:  Abnormal gait, Decreased balance, Decreased endurance, Decreased mobility, Difficulty walking, Impaired sensation, Decreased activity tolerance, Decreased safety awareness, Decreased strength, Postural dysfunction, Pain  Visit Diagnosis: Muscle weakness (generalized)  Unspecified disturbances of  skin sensation  History of falling  Abnormal posture  Difficulty in walking, not elsewhere classified  Balance disorder  Chronic pain of left knee  Chronic pain of right knee  Abnormality of gait due to impairment of balance     Problem List Patient Active Problem List   Diagnosis Date Noted   Neuropathy 10/27/2020   Abnormal gait 06/22/2020   Age-related osteoporosis without current pathological fracture 06/22/2020   Anxiety 06/22/2020   Hardening of the aorta (main artery of the heart) ([place]) 06/22/2020   Malignant lymphoma, non-Hodgkin's ([place]) 06/22/2020   Mild cognitive disorder 06/22/2020   Non-pressure chronic ulcer of unspecified part of unspecified lower leg with unspecified severity ([place]) 06/22/2020   Primary insomnia 06/22/2020   Pulmonary nodule 06/22/2020   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   Abnormal chest x-ray 12/31/2019   Cough 12/31/2019   Port-A-Cath in place 06/04/2019   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[place]) 04/14/2019   Counseling regarding advance care planning and goals of care 04/14/2019   Lymphadenopathy of head and neck 03/17/2019   Lymph nodes enlarged 03/17/2019   History of pulmonary embolus (PE) 03/17/2019   Acute pulmonary embolism ([place]) 03/06/2019   Diffuse lymphadenopathy  03/06/2019   Pleural effusion 03/06/2019   Pruritus 02/06/2019   Pruritic rash 02/06/2019   Angio-edema 02/06/2019   Shortness of breath 02/06/2019   Osteopenia 12/18/2013   Heart block AV first degree 12/18/2013   Breast cancer of upper-inner quadrant of right female breast ([place]) 03/19/2012   Right knee DJD 08/18/2011   Hypothyroidism    Hypertension    PONV (postoperative nausea and vomiting)    Hyperlipemia    Donato Heinz. Owens Shark PT  [place] 11/24/2020, [place], [place], [address] Phone: [phone]   Fax:  [phone]  Name: Sarah [name] MRN: [id number] Date  of Birth: 1932-10-05

## 2020-11-30 ENCOUNTER — Encounter: Payer: Self-pay | Admitting: Physical Therapy

## 2020-12-02 ENCOUNTER — Ambulatory Visit: Payer: Medicare Other

## 2020-12-02 ENCOUNTER — Other Ambulatory Visit: Payer: Self-pay

## 2020-12-02 DIAGNOSIS — M6281 Muscle weakness (generalized): Secondary | ICD-10-CM

## 2020-12-02 DIAGNOSIS — R209 Unspecified disturbances of skin sensation: Secondary | ICD-10-CM

## 2020-12-02 DIAGNOSIS — R2689 Other abnormalities of gait and mobility: Secondary | ICD-10-CM

## 2020-12-02 DIAGNOSIS — Z9181 History of falling: Secondary | ICD-10-CM | POA: Diagnosis not present

## 2020-12-02 DIAGNOSIS — R293 Abnormal posture: Secondary | ICD-10-CM | POA: Diagnosis not present

## 2020-12-02 DIAGNOSIS — R262 Difficulty in walking, not elsewhere classified: Secondary | ICD-10-CM | POA: Diagnosis not present

## 2020-12-02 DIAGNOSIS — M25562 Pain in left knee: Secondary | ICD-10-CM

## 2020-12-02 DIAGNOSIS — G8929 Other chronic pain: Secondary | ICD-10-CM

## 2020-12-02 NOTE — Therapy (Signed)
[place], [place], [address] Phone: [phone]   Fax:  [phone]  Physical Therapy Treatment  Patient Details  Name: Sarah [name] MRN: [id number] Date of Birth: 1932-12-17 Referring Provider (PT): Dr. Irene Limbo   Encounter Date: 12/02/2020   PT End of Session - 12/02/20 1114     Visit Number 6    Number of Visits 17    Date for PT Re-Evaluation 12/14/20    PT Start Time 1105    PT Stop Time 1200    PT Time Calculation (min) 55 min    Activity Tolerance Patient tolerated treatment well    Behavior During Therapy [facility] for tasks assessed/performed             Past Medical History:  Diagnosis Date   Allergy    codeine, thiazides   Anxiety    new dx   Arthritis    Breast cancer ([place]) 03/14/12   bx=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,ER/PR=+,upper inner quad   Cancer ([place])    breast   Cataract    DVT (deep venous thrombosis) ([place]) 02/2019   left leg   Dyspnea    Edema    both legs feet and toe, abdomen   Glaucoma    laser treated years ago   HOH (hard of hearing)    Hyperlipemia    Hypertension    Hypothyroidism    Pancreatic cyst    benign   PONV (postoperative nausea and vomiting)    Pulmonary embolism ([place]) 02/2019   bilateral    Radiation 06/11/2012-07/12/2012   17 sessions 4250 cGy, 3 sessions 750 cGy   Vertigo    Wears glasses    Wears partial dentures    partial upper    Past Surgical History:  Procedure Laterality Date   ABDOMINAL HYSTERECTOMY  1966   1/2 ovary left in    [place]   lumpectomy-lt   CATARACT EXTRACTION     b/l   COLONOSCOPY     EXCISION MASS NECK Left 03/17/2019    EXCISION MASS NECK (Left Neck)   EXCISION MASS NECK Left 03/17/2019   Procedure: EXCISION MASS NECK;  Surgeon: Helayne Seminole, MD;  Location: [facility];  Service: ENT;  Laterality: Left;   EYE SURGERY Bilateral    bilateral cataract removal   IR  IMAGING GUIDED PORT INSERTION  04/23/2019   JOINT REPLACEMENT  2013   rt total knee   JOINT REPLACEMENT  1995   lt total knee   PARTIAL MASTECTOMY WITH NEEDLE LOCALIZATION  04/09/2012   Procedure: PARTIAL MASTECTOMY WITH NEEDLE LOCALIZATION;  Surgeon: Adin Hector, MD;  Location: [facility];  Service: General;  Laterality: Right;   SKIN BIOPSY Left 03/17/2019   LEFT THIGH   SKIN BIOPSY Left 03/17/2019   Procedure: Skin Biopsy Left Thigh;  Surgeon: Helayne Seminole, MD;  Location: [facility];  Service: ENT;  Laterality: Left;   TONSILLECTOMY     TOTAL KNEE ARTHROPLASTY  08/16/2011   Procedure: TOTAL KNEE ARTHROPLASTY;  Surgeon: Ninetta Lights, MD;  Location: [facility];  Service: Orthopedics;  Laterality: Right;    There were no vitals filed for this visit.   Subjective Assessment - 12/02/20 1109     Subjective My Rt hip still bothers me from my fall 2 years ago.    Pertinent History pt with Angioimmunoblastic T cell lymphoma  with treatment starting  November 2020 and has had  diffiuclty since. She gets chemo once every 3 weeks. Past history includes bilateral TKR., right breast cancer 03/14/2012    Patient Stated Goals to walk without a cane    Currently in Pain? Yes    Pain Score 7     Pain Location Hip    Pain Orientation Right    Pain Descriptors / Indicators Sore   feels bruised   Pain Type Chronic pain    Pain Onset More than a month ago    Pain Frequency Intermittent    Aggravating Factors  from old fall    Pain Relieving Factors rest                               [facility] Adult PT Treatment/Exercise - 12/02/20 0001       Ambulation/Gait   Ambulation/Gait Yes    Ambulation/Gait Assistance 5: Supervision    Ambulation Distance (Feet) 100 Feet   2 reps   Assistive device Straight cane    Gait Pattern Step-to pattern    Stairs Yes    Stairs Assistance 5: Supervision    Stair Management Technique One rail Right    Number of Stairs 6     Gait Comments Continues to require VCs for erect posture but she does walk with an improved heel strike      Neuro Re-ed    Neuro Re-ed Details  In // bars: Heel-toe walking front and retro with VCs during each for erect posture and to keep hands by sides on bars and not let them get ahead of her, then slow and controlled high knee marching with seated rest after each, then bil side stepping with VCs for larg steps      Exercises   Other Exercises  standing at counter for 2 rounds of: 10 reps weight shift side to side x30 sec each, 1# weight to shelf with each hand (2# for second set and did a 3rd as well), stagger stance weight shift with each leg forward placing 2# on bottom cabinet shelf      Knee/Hip Exercises: Standing   Heel Raises Both;2 sets;10 reps   at counter for +2 HHA   Hip Flexion AROM;Right;Left;10 reps    Hip ADduction AROM;Right;Left;10 reps    Hip Extension AROM;Right;Left;10 reps    Functional Squat 10 reps   at counter   Functional Squat Limitations returning therapist demo and VCs during for technique                      PT Short Term Goals - 10/14/20 [place] #1   Title Pt will be independent with HEP within 3 weeks in order to demonstrate autonomy of care.    Time 4    Period Weeks    Status Deferred      PT SHORT TERM GOAL #2   Title Pt will 3/5 dorsiflexion of left ankle to decrease risk of fall while walking    Baseline 2-/5 on 10/14/2020    Time 4    Period Weeks    Status New      PT SHORT TERM GOAL #3   Title Pt will demonstrate safe technique in getting up and down 8 steps using handrail as neede    Time 4    Period Weeks    Status New  PT Long Term Goals - 10/14/20 1648       PT LONG TERM GOAL #1   Title Pt wil increase sit to stand to 8 in 30 seconds indicating an increase in fuctional strengthe    Baseline 4 in 30 seconds    Time 8    Period Weeks    Status New      PT LONG TERM  GOAL #2   Title Pt will decrease TUG time by 3 seconds indicating and decrease in risk for fall    Time 8    Period Weeks    Status New      PT LONG TERM GOAL #3   Title Pt will be able to walk 50 feet with cane and holding 5 pounds weight so that she safely do household chores.    Period Weeks    Status New      PT LONG TERM GOAL #4   Title Pt can walk 50 feet without the use of her cane so that she can safely walk across the rooms in her home    Time 8    Period Weeks    Status New                   Plan - 12/02/20 1114     Clinical Impression Statement Pt seems to be showing improvement with being aware of posture with static exercises. Progressed pt to include dynamic balance in // bars which she tolerated fairly well. She did require multiple seated rest breaks due to fatigue. Also VCs to keep her hands at her sides as she would not continue to slide them with her as she moved causing her COG to be to far forward. She did better with awareness of this by end of session. Pt reports enjoying session today and being challenged with balance activities.    Personal Factors and Comorbidities Comorbidity 3+    Comorbidities lymphoma with chemotherapy., bilateral TKR, osteopenia    Examination-Activity Limitations Stand;Stairs;Locomotion Level;Lift;Squat;Transfers    Examination-Participation Restrictions Volunteer;Church;Laundry    Stability/Clinical Decision Making Stable/Uncomplicated    Rehab Potential Good    PT Frequency 2x / week    PT Duration 8 weeks    PT Treatment/Interventions ADLs/Self Care Home Management;Moist Heat;Gait training;Stair training;DME Instruction;Functional mobility training;Therapeutic activities;Therapeutic exercise;Balance training;Neuromuscular re-education;Patient/family education;Orthotic Fit/Training    PT Next Visit Plan work on standing balance with knees straight and cont balance activities in // bars and stairs; continue interval training  strengthening to ankles, hips and kness with progressive weights,  practice stair training strategies,  teach HEP and progress    PT Home Exercise Plan standing ankle dorsi and plantar flexion    Consulted and Agree with Plan of Care Patient             Patient will benefit from skilled therapeutic intervention in order to improve the following deficits and impairments:  Abnormal gait, Decreased balance, Decreased endurance, Decreased mobility, Difficulty walking, Impaired sensation, Decreased activity tolerance, Decreased safety awareness, Decreased strength, Postural dysfunction, Pain  Visit Diagnosis: Muscle weakness (generalized)  Unspecified disturbances of skin sensation  History of falling  Abnormal posture  Difficulty in walking, not elsewhere classified  Balance disorder  Chronic pain of left knee  Chronic pain of right knee  Abnormality of gait due to impairment of balance     Problem List Patient Active Problem List   Diagnosis Date Noted   Neuropathy 10/27/2020   Abnormal gait 06/22/2020   Age-related  osteoporosis without current pathological fracture 06/22/2020   Anxiety 06/22/2020   Hardening of the aorta (main artery of the heart) ([place]) 06/22/2020   Malignant lymphoma, non-Hodgkin's ([place]) 06/22/2020   Mild cognitive disorder 06/22/2020   Non-pressure chronic ulcer of unspecified part of unspecified lower leg with unspecified severity ([place]) 06/22/2020   Primary insomnia 06/22/2020   Pulmonary nodule 06/22/2020   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   Abnormal chest x-ray 12/31/2019   Cough 12/31/2019   Port-A-Cath in place 06/04/2019   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[place]) 04/14/2019   Counseling regarding advance care planning and goals of care 04/14/2019   Lymphadenopathy of head and neck 03/17/2019   Lymph nodes enlarged 03/17/2019   History of pulmonary embolus (PE) 03/17/2019   Acute pulmonary embolism ([place]) 03/06/2019   Diffuse lymphadenopathy  03/06/2019   Pleural effusion 03/06/2019   Pruritus 02/06/2019   Pruritic rash 02/06/2019   Angio-edema 02/06/2019   Shortness of breath 02/06/2019   Osteopenia 12/18/2013   Heart block AV first degree 12/18/2013   Breast cancer of upper-inner quadrant of right female breast ([place]) 03/19/2012   Right knee DJD 08/18/2011   Hypothyroidism    Hypertension    PONV (postoperative nausea and vomiting)    Hyperlipemia     Otelia Limes, PTA 12/02/2020, 12:19 PM  [place], [place], [address] Phone: [phone]   Fax:  [phone]  Name: Sarah [name] MRN: [id number] Date of Birth: February 22, 1933

## 2020-12-05 NOTE — Progress Notes (Signed)
HEMATOLOGY/ONCOLOGY CLINIC NOTE  Date of Service: 12/06/2020  Patient Care Team: Lajean Manes, MD as PCP - General (Internal Medicine) Buford Dresser, MD as PCP - Cardiology (Cardiology)  REFERRING PHYSICIAN: Lajean Manes, MD  CHIEF COMPLAINTS/PURPOSE OF CONSULTATION:   Continue mx of Recently Diagnosed Angioimmunoblastic T cell lymphoma     HISTORY OF PRESENTING ILLNESS:   Sarah [name] is a wonderful 87 y.o. female who has been referred to [place] by Lajean Manes, MD for evaluation and management of Possible lymphoma . She is accompanied today by her daughter Sarah [name] . The pt reports that she is doing well overall.   Pending Surgical pathology from 03/17/2019 Dr.Manny will contact clinic about pathology   August 25th/27th she began coughing and sneezing. On Sept 3 went to doctors and they said it was allergies. She began having rashes (little red dots). Went to PCP and they prescribed prednisone and for several days and the medication helped. But as soon as she completed the prescription symptoms reoccurred.   She had a chest X Ray done because she was having a severe cough on 02/17/2019 revealing "bilateral effusions in this patient with history of breast cancer,of uncertain significance with question of right lower lobe pulmonary nodule, consider chest CT for further assessment. Atherosclerotic changes in the thoracic aorta."  Dr. Amy Martinique biopsied the rashes on her back 02/21/2019 and it was determined that it could be because of an allergy to medication. Dr.Stoneking stopped her blood pressure medication from September- October but ther rash still present. The itching started 1 to 2 months ago. The rash would come and go and was mostly on her back. She still has rash on her left thigh.  She is using triaconazole and cetrizine.  She has lumps behind ear and abdomin and she states that they have swollen twice the size within 1 and half months  She has no appetite and  has changes in her taste that began around August. She has changes in bowels. They are more loose and smaller In size. She also has no control over bowels.  She feels fatigued and it has increased since August.   She has a cough that starts in September  that has made her hoarse.   She had a CT scan on 02/27/2019 because she was having severe cough and her stomach was very extended. Revealing "1. Extensive lymphadenopathy throughout the chest, abdomen, and pelvis, with index lymph nodes identified above. Splenomegaly. Findings are most consistent with lymphoma with recurrent, metastatic breast malignancy less favored. 2. There is generally osteopenic appearance of the skeleton. There are numerous subtle hypodense lesions, particularly of the pelvis (e.g. Series 2, image 88) and select vertebral bodies, for example L4 (series 8, image 92). This is suspicious although not definitive for osseous metastatic disease. Characterization for metabolic activity by nuclear scintigraphic bone scan or PET-CT would be helpful to further evaluate. 3. Moderate right, small left pleural effusions with associated atelectasis or consolidation. Subpleural radiation fibrosis of the anterior right lung. 4. There is a new subpleural pulmonary nodule of the anterior right middle lobe measuring 6 mm (series 4, image 105), nonspecific. There is no obvious pleural thickening or nodularity definitive for metastatic disease. 5.  Small volume ascites. 6. There is a fluid attenuation lesion of the pancreatic uncinate measuring 2.4 x 1.3 cm (series 2, image 63), not substantially changed when compared to remote prior MR examination dated 02/23/2012 and likely sequelae of a prior pseudocyst versus incidental pancreatic IPMN. 7.  Cardiomegaly."  Hospitalized from 03/06/2019-03/08/2019. She presenting with acute shortness of breath, leg swelling, left hand swelling and some weight gain despite poor appetite. In ED, CTA chest revealed at least  submassive PE, moderate right pleural effusion, diffuse LAD concerning for lymphoma.  Started on heparin drip.  PCCM consulted for guidance.  She had thoracocentesis with removal of 1200 cc cloudy exudative pleural fluid.  Cardiology consulted for elevated which was thought to be demand ischemia from right heart strain in the setting of PE. Patient was transitioned to subcu Lovenox by pulmonology the next day and remained stable. Unusual appearance of tracheal air column at the thoracic inlet. Correlate with any history of swallowing difficulty or changes in voice with dedicated imaging with chest CT and or CT of the neck as indicated.  She has a nonhealing ulcer on her right tibialis anterior that starting weeping one week ago. Of note since the patient's last visit, pt has had CHEST XRAY - 2 VIEW (Accession [id number]) completed on 02/17/2019 with results revealing "Bilateral effusions in this patient with history of breast cancer,of uncertain significance with question of right lower lobepulmonary nodule, consider chest CT for furtherassessment. Unusual appearance of tracheal air column at the thoracic inlet.Correlate with any history of swallowing difficulty or changes in voice with dedicated imaging with chest CT and or CT of the neck as Indicated. Atherosclerotic changes in the thoracic aorta."  Of note since the patient's last visit, pt has had CT CHEST, ABDOMEN, AND PELVIS WITH CONTRAST (Accession [id number]) completed on 02/27/2019 with results revealing "1. Extensive lymphadenopathy throughout the chest, abdomen, and pelvis, with index lymph nodes identified above. Splenomegaly.Findings are most consistent with lymphoma with recurrent, metastatic breast malignancy less favored. 2. There is generally osteopenic appearance of the skeleton. There are numerous subtle hypodense lesions, particularly of the pelvis (e.g. Series 2, image 88) and select vertebral bodies, for example L4 (series 8, image 92).  This is suspicious although not definitive for osseous metastatic disease. Characterization for metabolic activity by nuclear scintigraphic bone scan or PET-CT would be helpful to further evaluate.   3. Moderate right, small left pleural effusions with associated atelectasis or consolidation. Subpleural radiation fibrosis of the anterior right lung.4. There is a new subpleural pulmonary nodule of the anterior right middle lobe measuring 6 mm (series 4, image 105), nonspecific. There is no obvious pleural thickening or nodularity definitive for metastatic disease. 5.  Small volume ascites. 6. There is a fluid attenuation lesion of the pancreatic uncinate measuring 2.4 x 1.3 cm (series 2, image 63), not substantially changed when compared to remote prior MR examination dated 02/23/2012 and likely sequelae of a prior pseudocyst versus incidental pancreatic IPMN.7.  Cardiomegaly. 8. Other chronic and incidental findings as detailed above. Aortic Atherosclerosis (ICD10-I70.0)."  Of note since the patient's last visit, pt has had PORTABLE CHEST 1 VIEW (Accession [id number]) completed on 03/06/2019 with results revealing "1. Moderate size right and small left pleural effusions appear not significantly changed since 02/27/19. 2. No new cardiopulmonary abnormality."  Of note since the patient's last visit, pt has had ECHOCARDIOGRAM  completed on 03/06/2019 with results revealing  "1. Left ventricular ejection fraction, by visual estimation, is 60 to 65%. The left ventricle has normal function. Normal left ventricular size. There is no left ventricular hypertrophy. 2. Left ventricular diastolic Doppler parameters are consistent with impaired relaxation pattern of LV diastolic filling. 3. Global right ventricle has mildly reduced systolic function.The right ventricular size is moderately enlarged. No increase in right  ventricular wall thickness. 4. Left atrial size was normal. 5. Right atrial size was mildly dilated.  6. Trivial pericardial effusion is present. 7. The mitral valve is normal in structure. Trace mitral valve regurgitation. No evidence of mitral stenosis. 8. The tricuspid valve is normal in structure. Tricuspid valve regurgitation severe. 9. The aortic valve is normal in structure. Aortic valve regurgitation was not visualized by color flow Doppler. Structurally normal aortic valve, with no evidence of sclerosis or stenosis.10. The pulmonic valve was normal in structure. Pulmonic valve regurgitation is mild by color flow Doppler.11. Mildly elevated pulmonary artery systolic pressure.12. The inferior vena cava is normal in size with greater than 50% respiratory variability, suggesting right atrial pressure of 3 mmHg."  Of note since the patient's last visit, pt has had CT ANGIOGRAPHY CHEST WITH CONTRAST (Accession [id number]) completed on 03/06/2019 with results revealing "1. Pulmonary embolus arising from the distal left main pulmonary artery with extension into multiple left lower lobe pulmonary arterial branches. There is also incompletely obstructing pulmonary embolus in the proximal left upper lobe pulmonary artery. More distal pulmonary embolus in the right lower lobe pulmonary artery. Positive for acute PE with CT evidence of right heart strain (RV/LV Ratio = 1.6) consistent with at least submassive (intermediate risk) PE. The presence of right heart strain has been associated with an increased risk of morbidity and mortality. Please activate Code PE by paging [phone]. 2. Sizable pleural effusion on the right with smaller pleural effusion on the left. Consolidation and compressive atelectasis throughout most of the right lower lobe. Milder atelectasis left base. Nodular opacities on the right, likely metastatic foci.3.  Stable adenopathy compared to 1 week prior.4. Enlargement of spleen, incompletely visualized but documented CT 1 week prior. Concern for potential lymphoma.  5. Aortic atherosclerosis.  No aneurysm or dissection evident. Foci of coronary artery calcification noted."  Most recent lab results (03/06/2019) of CBC is as follows: all values are WNL except for Platelets at 72, Eosinophils Absolute at 0.6,Abs Immature Granulocytes at 0.08 .  On review of systems, pt reports rashes, digestive issues and denies back pain, abdominal pain and any other symptoms.   On PMHx the pt reports Hypothyroidism, MCI, Hypercholesterolemia, Tracheal anomaly, Hypertension, Pulmonary nodule, Atherosclerosis of aorta, anxiety, gait disorder, primary insomnia.   INTERVAL HISTORY:  Sarah [name] is a 87 y.o. female here for evaluation and management of Angioimmunoblastic T-cell lymphoma. The patient's last visit with [place] was on 10/11/2020. The pt reports that she is doing well overall. We are joined today by her daughter.  The pt reports that the neuropathy is still bothering her around the same. She is still actively working with the PT. Her shoulders are hurting and this causes some difficulty sleeping. She notes her knees are worse as of recently. The pt notes much weakness and that her legs give way when she walks. She denies any sudden buckling of the knees. She is having difficulties standing from a seated position. The numbness in her toes is persistent. She has been using her cane. Her daughter believes she needs a walker and the PT recommended this as well.  The pt's daughter notes that in the last three weeks, she has been dealing with four bouts of nausea. She denies getting overheated and notes some lightheadedness. This nausea comes and goes within around ten minutes.   Lab results today 12/06/2020 of CBC w/diff and CMP is as follows: all values are WNL except for Lymphs Abs of 0.6K,  Glucose of  161, AST of 13. 12/06/2020 LDH of 257.  On review of systems, pt reports lower extremity weakness, muscle loss, imbalance, weight loss, memory loss, fatigue and denies acute skin rashes, abdominal  pain, leg swelling, fevers, chills, night sweats, new lumps/bumps, difficulty sleeping, and any other symptoms.   MEDICAL HISTORY:  Past Medical History:  Diagnosis Date   Allergy    codeine, thiazides   Anxiety    new dx   Arthritis    Breast cancer ([facility]) 03/14/12   bx=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,ER/PR=+,upper inner quad   Cancer ([facility])    breast   Cataract    DVT (deep venous thrombosis) ([facility]) 02/2019   left leg   Dyspnea    Edema    both legs feet and toe, abdomen   Glaucoma    laser treated years ago   HOH (hard of hearing)    Hyperlipemia    Hypertension    Hypothyroidism    Pancreatic cyst    benign   PONV (postoperative nausea and vomiting)    Pulmonary embolism ([place]) 02/2019   bilateral    Radiation 06/11/2012-07/12/2012   17 sessions 4250 cGy, 3 sessions 750 cGy   Vertigo    Wears glasses    Wears partial dentures    partial upper     SURGICAL HISTORY: Past Surgical History:  Procedure Laterality Date   ABDOMINAL HYSTERECTOMY  1966   1/2 ovary left in    [place]   lumpectomy-lt   CATARACT EXTRACTION     b/l   COLONOSCOPY     EXCISION MASS NECK Left 03/17/2019    EXCISION MASS NECK (Left Neck)   EXCISION MASS NECK Left 03/17/2019   Procedure: EXCISION MASS NECK;  Surgeon: Helayne Seminole, MD;  Location: [place];  Service: ENT;  Laterality: Left;   EYE SURGERY Bilateral    bilateral cataract removal   IR IMAGING GUIDED PORT INSERTION  04/23/2019   JOINT REPLACEMENT  2013   rt total knee   JOINT REPLACEMENT  1995   lt total knee   PARTIAL MASTECTOMY WITH NEEDLE LOCALIZATION  04/09/2012   Procedure: PARTIAL MASTECTOMY WITH NEEDLE LOCALIZATION;  Surgeon: Adin Hector, MD;  Location: [place];  Service: General;  Laterality: Right;   SKIN BIOPSY Left 03/17/2019   LEFT THIGH   SKIN BIOPSY Left 03/17/2019   Procedure: Skin Biopsy Left Thigh;  Surgeon: Helayne Seminole, MD;  Location:  [place] [facility];  Service: ENT;  Laterality: Left;   TONSILLECTOMY     TOTAL KNEE ARTHROPLASTY  08/16/2011   Procedure: TOTAL KNEE ARTHROPLASTY;  Surgeon: Ninetta Lights, MD;  Location: [place];  Service: Orthopedics;  Laterality: Right;     SOCIAL HISTORY: Social History   Socioeconomic History   Marital status: Widowed    Spouse name: Not on file   Number of children: 2   Years of education: Not on file   Highest education level: Not on file  Occupational History   Occupation: Retired    Comment: Community education officer   Tobacco Use   Smoking status: Never   Smokeless tobacco: Never  Vaping Use   Vaping Use: Never used  Substance and Sexual Activity   Alcohol use: No   Drug use: No   Sexual activity: Not Currently  Other Topics Concern   Not on file  Social History Narrative   Not on file   Social Determinants of Health   Financial Resource Strain:  Not on file  Food Insecurity: Not on file  Transportation Needs: Not on file  Physical Activity: Not on file  Stress: Not on file  Social Connections: Not on file  Intimate Partner Violence: Not on file     FAMILY HISTORY: Family History  Problem Relation Age of Onset   Heart disease Father    Cancer Maternal Aunt        stomach     ALLERGIES:   is allergic to citalopram, alendronate sodium, amoxicillin-pot clavulanate, codeine, latex, lisinopril, septra [sulfamethoxazole-trimethoprim], and thiazide-type diuretics.   MEDICATIONS:  Current Outpatient Medications  Medication Sig Dispense Refill   amLODipine (NORVASC) 2.5 MG tablet Take 2.5-5 mg by mouth See admin instructions. 5 mg in the morning, 2.5 mg in the evening     amoxicillin (AMOXIL) 500 MG capsule Take 4 capsules (2,000 mg total) by mouth once as needed for up to 1 dose. 60 mins prior to dental procedure (Patient not taking: Reported on 10/14/2020) 4 capsule 0   apixaban (ELIQUIS) 2.5 MG TABS tablet Take 1 tablet (2.5 mg total) by mouth 2 (two) times daily. 60 tablet 5    benzonatate (TESSALON) 100 MG capsule Take 1 capsule (100 mg total) by mouth 3 (three) times daily as needed for cough. (Patient not taking: Reported on 10/14/2020) 30 capsule 0   Cholecalciferol (VITAMIN D) 50 MCG (2000 UT) tablet Take 2,000 Units by mouth 2 (two) times daily.     clonazepam (KLONOPIN) 0.125 MG disintegrating tablet Take 1 tablet (0.125 mg total) by mouth 2 (two) times daily. (Patient not taking: Reported on 10/14/2020) 60 tablet 0   Cyanocobalamin (VITAMIN B-12 PO) Take 3,000 mcg by mouth daily.      gabapentin (NEURONTIN) 100 MG capsule Take 2 capsules (200 mg total) by mouth at bedtime. (Patient not taking: Reported on 10/14/2020) 60 capsule 2   levothyroxine (SYNTHROID) 25 MCG tablet Take 25 mcg by mouth daily before breakfast.      lidocaine-prilocaine (EMLA) cream Apply to affected area once 30 g 3   LORazepam (ATIVAN) 0.5 MG tablet TAKE 1/2 (ONE-HALF) TO 1 TABLET BY MOUTH EVERY 8 HOURS 30 tablet 0   Multiple Vitamins-Minerals (PRESERVISION AREDS PO) Take 1 capsule by mouth 2 (two) times daily.      ondansetron (ZOFRAN) 8 MG tablet Take 1 tablet (8 mg total) by mouth 2 (two) times daily as needed. Start on the third day after chemotherapy. (Patient not taking: Reported on 10/14/2020) 30 tablet 1   polyethylene glycol (MIRALAX / GLYCOLAX) 17 g packet Take 17 g by mouth daily.     prochlorperazine (COMPAZINE) 10 MG tablet Take 1 tablet (10 mg total) by mouth every 6 (six) hours as needed (Nausea or vomiting). 30 tablet 1   sodium chloride (MURO 128) 5 % ophthalmic ointment Place 1 application into both eyes at bedtime.     XIIDRA 5 % SOLN Place 1 drop into both eyes daily as needed (dry eyes).  (Patient not taking: Reported on 10/14/2020)     zolpidem (AMBIEN) 5 MG tablet Take 5 mg by mouth at bedtime as needed for sleep.      No current facility-administered medications for this visit.     REVIEW OF SYSTEMS:   10 Point review of Systems was done is negative except as noted  above.  PHYSICAL EXAMINATION: ECOG FS:1 - Symptomatic but completely ambulatory  Vitals:   12/06/20 1358  BP: 129/73  Pulse: 73  Resp: 17  Temp: 98.8 F (  37.1 C)  SpO2: 93%    Wt Readings from Last 3 Encounters:  12/06/20 106 lb 12.8 oz (48.4 kg)  11/15/20 108 lb 14.4 oz (49.4 kg)  10/26/20 108 lb 4 oz (49.1 kg)   Body mass index is 17.24 kg/m.    Exam was given in a chair.   GENERAL:alert, in no acute distress and comfortable SKIN: no acute rashes, no significant lesions EYES: conjunctiva are pink and non-injected, sclera anicteric OROPHARYNX: MMM, no exudates, no oropharyngeal erythema or ulceration NECK: supple, no JVD LYMPH:  no palpable lymphadenopathy in the cervical, axillary or inguinal regions LUNGS: clear to auscultation b/l with normal respiratory effort HEART: regular rate & rhythm ABDOMEN:  normoactive bowel sounds , non tender, not distended. Extremity: no pedal edema PSYCH: alert & oriented x 3 with fluent speech NEURO: no focal motor/sensory deficits   LABORATORY DATA:  I have reviewed the data as listed  CBC Latest Ref Rng & Units 12/06/2020 11/15/2020 10/26/2020  WBC 4.0 - 10.5 K/uL 6.2 4.2 4.5  Hemoglobin 12.0 - 15.0 g/dL 13.8 14.1 14.1  Hematocrit 36.0 - 46.0 % 42.1 41.8 42.5  Platelets 150 - 400 K/uL 192 162 119(L)    CMP Latest Ref Rng & Units 12/06/2020 11/15/2020 10/26/2020  Glucose 70 - 99 mg/dL 161(H) 83 109(H)  BUN 8 - 23 mg/dL 23 25(H) 22  Creatinine 0.44 - 1.00 mg/dL 0.87 0.65 0.78  Sodium 135 - 145 mmol/L 143 143 143  Potassium 3.5 - 5.1 mmol/L 3.8 4.1 3.8  Chloride 98 - 111 mmol/L 106 107 105  CO2 22 - 32 mmol/L 29 27 27   Calcium 8.9 - 10.3 mg/dL 9.6 10.5(H) 9.9  Total Protein 6.5 - 8.1 g/dL 6.8 7.1 6.8  Total Bilirubin 0.3 - 1.2 mg/dL 0.7 0.6 0.5  Alkaline Phos 38 - 126 U/L 78 71 71  AST 15 - 41 U/L 13(L) 23 20  ALT 0 - 44 U/L 11 14 14    . Lab Results  Component Value Date   LDH 257 (H) 12/06/2020    Component     Latest  Ref Rng & Units 03/27/2019  Hepatitis B Surface Ag     NON REACTIVE NON REACTIVE  Hep B Core Total Ab     NON REACTIVE NON REACTIVE  HCV Ab     NON REACTIVE NON REACTIVE  LDH     98 - 192 U/L 252 (H)   . Lab Results  Component Value Date   LDH 257 (H) 12/06/2020      04/08/2019 NM PET Image Initial (PI) Skull Base To Thigh (Accession [id number])  04/04/2019 PATHOLOGY   04/07/2019 ECHOCARDIOGRAM  03/28/2019 PATHOLOGY   03/06/2019 CT ANGIOGRAPHY CHEST WITH CONTRAST (Accession [id number])  04/07/2019 ECHOCARDIOGRAM  03/06/2019 ECHOCARDIOGRAM     03/06/2019 PORTABLE CHEST 1 VIEW (Accession [id number])    02/27/2019 CT CHEST, ABDOMEN, AND PELVIS WITH CONTRAST (Accession [id number])    02/17/2019 CHEST XRAY - 2 VIEW (Accession [id number])    RADIOGRAPHIC STUDIES: I have personally reviewed the radiological images as listed and agreed with the findings in the report. No results found.   ASSESSMENT & PLAN:   Sarah [name] is a 87 y.o. female with:  1. 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 SKIN, LEFT THIGH, BIOPSY: - Involvement by angioimmunoblastic T-cell lymphoma." -04/07/2019 Echocardiogram - normal EF -04/08/2019 NM PET Image Initial (PI) Skull Base To Thigh (  Accession [id number]) revealed "1. Widespread hypermetabolic adenopathy in the neck, chest, and abdomen/pelvis, primarily in the Deauville 4 and Deauville 5 range. There is also hypermetabolic activity along the posterior nasopharynx in lingual tonsillar regions potentially indicating sites of involvement. 2. The subtle hypodensities in the spine and bony pelvis are not appreciably hypermetabolic may be incidental, surveillance is suggested. 3. Bilateral thyroid activity but especially on the left. Probably from thyroiditis. 4. A 6 mm subpleural  nodule anteriorly in the right lower lobe not appreciably hypermetabolic but is below sensitive PET-CT size thresholds and merits surveillance. 5. Mildly accentuated diffuse splenic activity without splenomegaly or focal splenic lesion identified. 6. Other imaging findings of potential clinical significance: Aortic Atherosclerosis (ICD10-I70.0). Coronary atherosclerosis. Large right and small left pleural effusions. Moderate cardiomegaly. Small pericardial effusion. Mild mesenteric edema. Large left renal cyst. Fullness of both renal collecting systems with suspected nonobstructive left nephrolithiasis. Degenerative grade 1 anterolisthesis at L4-5 at L5-S1. Trace pelvic ascites." -05/28/2019 PET/CT ([id number]) revealed "Complete metabolic response to therapy". -09/16/2019 PET/CT ([id number]) revealed "1. Unfortunately evidence of lymphoma recurrence at sites of previous hypermetabolic adenopathy. Hypermetabolic lymphoid tissue is new from comparison PET-CT 05/28/2019 but at same locations as PET-CT 04/08/2019. The number of metastatic lymph nodes is less than 04/08/2019. Lymphoid tissue at the RIGHT skull base is larger. 2. Three foci of hypermetabolic recurrence are present, lymphoid tissue at the RIGHT base of skull, hypermetabolic RIGHT hilar lymph node and hypermetabolic lymph node in the porta hepatis." -11/25/2019 PET/CT ([id number]) revealed "1. Marked interval improvement in the hypermetabolic disease identified previously in the posterior right nasopharynx, compatible with Deauville 4 today. 2. Decreased hypermetabolism in the right hilar and porta hepatis lymph nodes identified previously (Deauville 3). No new sites of hypermetabolic disease in the chest, abdomen, or pelvis. 3. Similar diffuse thyroid uptake, left greater than right. 4. No substantial change in right-sided pulmonary nodules." -04/09/2020 PET/CT ([id number]) revealed "No evidence progressive lymphoma."  2. Pulmonary Embolism  -extensive likely related to extensive malignancy  3. H/o Breast Cancer  -The patient had bilateral diagnostic  mammography at [facility] 07/11/2011. This  showed some calcifications in the right  breast, which seemed a little bit more  prominent than prior. In the left breast  there was an area of possible  architectural distortion. However left  breast ultrasound the same day showed  no abnormality. 6 month followup was  suggested, and on 02/28/2012 the  patient again had bilateral diagnostic  mammography, now with right  ultrasonography. The microcalcifications  in the right breast appeared increased.  Ultrasound showed a hypoechoic lesion  measuring 5 mm, with no associated  shadowing. This had been previously  noted and appeared unchanged. Anterior  to that there was a small hypoechoic  mass measuring 4 mm in diameter.  There was felt to be suspicious, and on  03/14/2012 the patient underwent biopsy  of the right breast mass, showing ([id number]  [id number]) ductal carcinoma in situ,  intermediate grade, estrogen receptor  100% and progesterone receptor 100%  positive.    -Bilateral breast MRI was obtained  03/26/2012. This showed only post  biopsy changes in the right breast,  associated with an area of non-masslike  enhancement measuring 2.4 cm. The  left breast was unremarkable, and there  was no enlarged axillary or internal  mammary adenopathy noted.  Accordingly on 04/09/2012 the patient  underwent right lumpectomy, the  pathology ([id number]) showing ductal  carcinoma in situ measuring 2.0 cm,  grade 2, with negative margins,  the  closest being 0.3 cm. The patient's  subsequent history is as detailed below.   PLAN: -Discussed pt labwork today, 07/182022; blood counts and chemistries stable. -Emphasized importance of increasing caloric intake and focusing on calorically dense foods. The pt has no restrictions in regards to diet and should focus on gaining weight and increasing daily calories. -Advised pt  that if she does not liberalize diet, she will continue to become more frail and lose weight. The focus should be working on increasing caloric intake. -Recommended pt use rolling walker as opposed to cane to help with more stability while walking.  -Continue to f/u w PT and working on proper exercise and diet. -Advised pt not to drive at this time due to neuropathy in feet. -Patient has no lab or clinical evidence of lymphoma progression/recurrence at this time. -Will continue to hold off on further treatment to allow pt to recover and maintain independence and diet. -Recommended pt avoid open-toes shoes and flip flops. -Continue 2.5 Eliquis BID  -Recommended pt take Vitamin B Complex daily. Hold all B12 supplementation. -Will see back in 2 months with labs.   FOLLOW-UP: RTC w Dr Irene Limbo on 8-10 weeks with labs Cancel all other scheduled appointments   The total time spent in the appointment was 30 minutes and more than 50% was on counseling and direct patient cares.  All of the patient's questions were answered with apparent satisfaction. The patient knows to call the clinic with any problems, questions or concerns.    Sullivan Lone MD [place] AAHIVMS [facility] Hematology/Oncology Physician [facility]  (Office):       [phone] (Work cell):  [phone] (Fax):           [phone]  12/06/2020 2:43 PM   I, Reinaldo Raddle, am acting as scribe for Dr. Sullivan Lone, MD.  .I have reviewed the above documentation for accuracy and completeness, and I agree with the above. Brunetta Genera MD

## 2020-12-06 ENCOUNTER — Other Ambulatory Visit: Payer: Self-pay

## 2020-12-06 ENCOUNTER — Inpatient Hospital Stay (HOSPITAL_BASED_OUTPATIENT_CLINIC_OR_DEPARTMENT_OTHER): Payer: Medicare Other | Admitting: Hematology

## 2020-12-06 ENCOUNTER — Inpatient Hospital Stay: Payer: Medicare Other

## 2020-12-06 ENCOUNTER — Ambulatory Visit: Payer: Medicare Other

## 2020-12-06 ENCOUNTER — Inpatient Hospital Stay: Payer: Medicare Other | Attending: Hematology

## 2020-12-06 VITALS — BP 129/73 | HR 73 | Temp 98.8°F | Resp 17 | Ht 66.0 in | Wt 106.8 lb

## 2020-12-06 DIAGNOSIS — R262 Difficulty in walking, not elsewhere classified: Secondary | ICD-10-CM | POA: Diagnosis not present

## 2020-12-06 DIAGNOSIS — C8440 Peripheral T-cell lymphoma, not classified, unspecified site: Secondary | ICD-10-CM

## 2020-12-06 DIAGNOSIS — Z853 Personal history of malignant neoplasm of breast: Secondary | ICD-10-CM | POA: Insufficient documentation

## 2020-12-06 DIAGNOSIS — R2689 Other abnormalities of gait and mobility: Secondary | ICD-10-CM

## 2020-12-06 DIAGNOSIS — Z452 Encounter for adjustment and management of vascular access device: Secondary | ICD-10-CM | POA: Insufficient documentation

## 2020-12-06 DIAGNOSIS — M25562 Pain in left knee: Secondary | ICD-10-CM

## 2020-12-06 DIAGNOSIS — Z95828 Presence of other vascular implants and grafts: Secondary | ICD-10-CM

## 2020-12-06 DIAGNOSIS — R293 Abnormal posture: Secondary | ICD-10-CM | POA: Diagnosis not present

## 2020-12-06 DIAGNOSIS — M6281 Muscle weakness (generalized): Secondary | ICD-10-CM

## 2020-12-06 DIAGNOSIS — G629 Polyneuropathy, unspecified: Secondary | ICD-10-CM

## 2020-12-06 DIAGNOSIS — Z9181 History of falling: Secondary | ICD-10-CM | POA: Diagnosis not present

## 2020-12-06 DIAGNOSIS — Z5111 Encounter for antineoplastic chemotherapy: Secondary | ICD-10-CM

## 2020-12-06 DIAGNOSIS — C865 Angioimmunoblastic T-cell lymphoma: Secondary | ICD-10-CM | POA: Insufficient documentation

## 2020-12-06 DIAGNOSIS — R209 Unspecified disturbances of skin sensation: Secondary | ICD-10-CM

## 2020-12-06 DIAGNOSIS — G8929 Other chronic pain: Secondary | ICD-10-CM

## 2020-12-06 DIAGNOSIS — Z20822 Contact with and (suspected) exposure to covid-19: Secondary | ICD-10-CM | POA: Diagnosis not present

## 2020-12-06 LAB — CBC WITH DIFFERENTIAL/PLATELET
Abs Immature Granulocytes: 0.01 10*3/uL (ref 0.00–0.07)
Basophils Absolute: 0 10*3/uL (ref 0.0–0.1)
Basophils Relative: 1 %
Eosinophils Absolute: 0.2 10*3/uL (ref 0.0–0.5)
Eosinophils Relative: 3 %
HCT: 42.1 % (ref 36.0–46.0)
Hemoglobin: 13.8 g/dL (ref 12.0–15.0)
Immature Granulocytes: 0 %
Lymphocytes Relative: 10 %
Lymphs Abs: 0.6 10*3/uL — ABNORMAL LOW (ref 0.7–4.0)
MCH: 30.3 pg (ref 26.0–34.0)
MCHC: 32.8 g/dL (ref 30.0–36.0)
MCV: 92.5 fL (ref 80.0–100.0)
Monocytes Absolute: 0.5 10*3/uL (ref 0.1–1.0)
Monocytes Relative: 8 %
Neutro Abs: 4.8 10*3/uL (ref 1.7–7.7)
Neutrophils Relative %: 78 %
Platelets: 192 10*3/uL (ref 150–400)
RBC: 4.55 MIL/uL (ref 3.87–5.11)
RDW: 13.8 % (ref 11.5–15.5)
WBC: 6.2 10*3/uL (ref 4.0–10.5)
nRBC: 0 % (ref 0.0–0.2)

## 2020-12-06 LAB — CMP (CANCER CENTER ONLY)
ALT: 11 U/L (ref 0–44)
AST: 13 U/L — ABNORMAL LOW (ref 15–41)
Albumin: 3.6 g/dL (ref 3.5–5.0)
Alkaline Phosphatase: 78 U/L (ref 38–126)
Anion gap: 8 (ref 5–15)
BUN: 23 mg/dL (ref 8–23)
CO2: 29 mmol/L (ref 22–32)
Calcium: 9.6 mg/dL (ref 8.9–10.3)
Chloride: 106 mmol/L (ref 98–111)
Creatinine: 0.87 mg/dL (ref 0.44–1.00)
GFR, Estimated: >60 mL/min (ref >60)
Glucose, Bld: 161 mg/dL — ABNORMAL HIGH (ref 70–99)
Potassium: 3.8 mmol/L (ref 3.5–5.1)
Sodium: 143 mmol/L (ref 135–145)
Total Bilirubin: 0.7 mg/dL (ref 0.3–1.2)
Total Protein: 6.8 g/dL (ref 6.5–8.1)

## 2020-12-06 LAB — LACTATE DEHYDROGENASE: LDH: 257 U/L — ABNORMAL HIGH (ref 98–192)

## 2020-12-06 MED ORDER — HEPARIN SOD (PORK) LOCK FLUSH 100 UNIT/ML IV SOLN
500.0000 [IU] | Freq: Once | INTRAVENOUS | Status: AC
  Administered 2020-12-06: 500 [IU]
  Filled 2020-12-06: qty 5

## 2020-12-06 MED ORDER — SODIUM CHLORIDE 0.9% FLUSH
10.0000 mL | Freq: Once | INTRAVENOUS | Status: AC
  Administered 2020-12-06: 10 mL
  Filled 2020-12-06: qty 10

## 2020-12-06 NOTE — Therapy (Signed)
[place], [place], [address] Phone: [phone]   Fax:  [phone]  Physical Therapy Treatment  Patient Details  Name: Sarah [name] MRN: [id number] Date of Birth: 1932-11-02 Referring Provider (PT): Dr. Irene Limbo   Encounter Date: 12/06/2020   PT End of Session - 12/06/20 1644     Visit Number 7    Number of Visits 17    Date for PT Re-Evaluation 12/14/20    PT Start Time 5093   pt arrived late from infusion at [facility]   PT Stop Time 2671   pt extremely fatigued and had to end session early   PT Time Calculation (min) 32 min    Activity Tolerance Patient limited by fatigue;Patient limited by pain    Behavior During Therapy [facility] for tasks assessed/performed             Past Medical History:  Diagnosis Date   Allergy    codeine, thiazides   Anxiety    new dx   Arthritis    Breast cancer ([place]) 03/14/12   bx=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,ER/PR=+,upper inner quad   Cancer ([place])    breast   Cataract    DVT (deep venous thrombosis) ([place]) 02/2019   left leg   Dyspnea    Edema    both legs feet and toe, abdomen   Glaucoma    laser treated years ago   HOH (hard of hearing)    Hyperlipemia    Hypertension    Hypothyroidism    Pancreatic cyst    benign   PONV (postoperative nausea and vomiting)    Pulmonary embolism ([place]) 02/2019   bilateral    Radiation 06/11/2012-07/12/2012   17 sessions 4250 cGy, 3 sessions 750 cGy   Vertigo    Wears glasses    Wears partial dentures    partial upper    Past Surgical History:  Procedure Laterality Date   ABDOMINAL HYSTERECTOMY  1966   1/2 ovary left in    [place]   lumpectomy-lt   CATARACT EXTRACTION     b/l   COLONOSCOPY     EXCISION MASS NECK Left 03/17/2019    EXCISION MASS NECK (Left Neck)   EXCISION MASS NECK Left 03/17/2019   Procedure: EXCISION MASS NECK;  Surgeon: Helayne Seminole,  MD;  Location: [place];  Service: ENT;  Laterality: Left;   EYE SURGERY Bilateral    bilateral cataract removal   IR IMAGING GUIDED PORT INSERTION  04/23/2019   JOINT REPLACEMENT  2013   rt total knee   JOINT REPLACEMENT  1995   lt total knee   PARTIAL MASTECTOMY WITH NEEDLE LOCALIZATION  04/09/2012   Procedure: PARTIAL MASTECTOMY WITH NEEDLE LOCALIZATION;  Surgeon: Adin Hector, MD;  Location: [place];  Service: General;  Laterality: Right;   SKIN BIOPSY Left 03/17/2019   LEFT THIGH   SKIN BIOPSY Left 03/17/2019   Procedure: Skin Biopsy Left Thigh;  Surgeon: Helayne Seminole, MD;  Location: [place];  Service: ENT;  Laterality: Left;   TONSILLECTOMY     TOTAL KNEE ARTHROPLASTY  08/16/2011   Procedure: TOTAL KNEE ARTHROPLASTY;  Surgeon: Ninetta Lights, MD;  Location: [place];  Service: Orthopedics;  Laterality: Right;    There were no vitals filed for this visit.   Subjective Assessment - 12/06/20 1610     Subjective I had an infusion today so I'm real tired and may  not be able to exercise for long. My knees and hips are really bothering me today.    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 She gets chemo once every 3 weeks. Past history includes bilateral TKR., right breast cancer 03/14/2012    Patient Stated Goals to walk without a cane    Currently in Pain? Yes    Pain Score 6     Pain Location Hip   and knees   Pain Orientation Right;Left    Pain Descriptors / Indicators Sore    Pain Type Chronic pain    Pain Onset More than a month ago    Pain Frequency Intermittent    Aggravating Factors  from old fall and old bil TKR; tired from infusion today    Pain Relieving Factors rest                               [facility] Adult PT Treatment/Exercise - 12/06/20 0001       Ambulation/Gait   Ambulation/Gait Yes    Ambulation/Gait Assistance 4: Min guard     Ambulation/Gait Assistance Details Pt brought new RW and this was high for her so adjusted this for her height. VCs to keep RW close to allow for improved posture    Ambulation Distance (Feet) 50 Feet   in hall   Assistive device Rolling walker    Gait Pattern Step-through pattern    Ambulation Surface Level      Neuro Re-ed    Neuro Re-ed Details  Tried in hall (too fatigued to walk to // bars today): Front Heel-toe walking with walker 2x20 ft, unable to do retro due to needed +2 HHA and unable to do this with walker (wouldn't roll backwards); then tried bil sidestepping with hands on wall but pt did not feel steady with this either so stopped      Knee/Hip Exercises: Standing   Hip Flexion AROM;Right;Left;10 reps   +2 HHA on counter for all hip 3 way raises   Hip Abduction AROM;Right;Left;10 reps    Hip Extension AROM;Right;Left;10 reps      Knee/Hip Exercises: Seated   Long Arc Quad AROM;Right;Other (comment)    Long Arc Quad Limitations Tried these seated on balance disc but pt reports knees hurt too bad today to cont    [employer]   bolster squeeze   Clamshell with [employer]   x10, had to stop due to fatigue   Marching Right;Left;2 sets;10 reps   seated on balance disc                     PT Short Term Goals - 10/14/20 1647       PT SHORT TERM GOAL #1   Title Pt will be independent with HEP within 3 weeks in order to demonstrate autonomy of care.    Time 4    Period Weeks    Status Deferred      PT SHORT TERM GOAL #2   Title Pt will 3/5 dorsiflexion of left ankle to decrease risk of fall while walking    Baseline 2-/5 on 10/14/2020    Time 4    Period Weeks    Status New      PT SHORT TERM GOAL #3   Title Pt will demonstrate safe technique in getting up and down 8 steps  using handrail as neede    Time 4    Period Weeks    Status New               PT Long Term Goals - 10/14/20 1648       PT LONG TERM GOAL #1   Title Pt wil  increase sit to stand to 8 in 30 seconds indicating an increase in fuctional strengthe    Baseline 4 in 30 seconds    Time 8    Period Weeks    Status New      PT LONG TERM GOAL #2   Title Pt will decrease TUG time by 3 seconds indicating and decrease in risk for fall    Time 8    Period Weeks    Status New      PT LONG TERM GOAL #3   Title Pt will be able to walk 50 feet with cane and holding 5 pounds weight so that she safely do household chores.    Period Weeks    Status New      PT LONG TERM GOAL #4   Title Pt can walk 50 feet without the use of her cane so that she can safely walk across the rooms in her home    Time 8    Period Weeks    Status New                   Plan - 12/06/20 1647     Clinical Impression Statement Pt returns to physical therapy today after having an infusion today and reports increased fatigue and increased bil hip and knee pain today as well. So due to all this pt was limited in activity tolerance and required a few seated rest breaks during activities. Also demonstrated some SOB due to fatigue. Her standing tolerance was limited and she felt she needed +2 HHA for all balance activities attempted so this became limited as well. She did bring a new RW that her daughter just got her. After adjusting height to fit pt (this was too tall for her) briefly practiced safe ambultaion with this in hall with cuing to keep walker close to allow for improved posture and to control her pacing. Educated daughter in this as well so she can further assist her mom with safe ambulation. She is considering getting her a rollator but will try the RW a few weeks to see how her mom tolerates this. Pt was then able to return good demo. Shortened session today due to all mentioned.    Personal Factors and Comorbidities Comorbidity 3+    Comorbidities lymphoma with chemotherapy., bilateral TKR, osteopenia    Examination-Activity Limitations Stand;Stairs;Locomotion  Level;Lift;Squat;Transfers    Examination-Participation Restrictions Volunteer;Church;Laundry    Stability/Clinical Decision Making Stable/Uncomplicated    Rehab Potential Good    PT Frequency 2x / week    PT Duration 8 weeks    PT Treatment/Interventions ADLs/Self Care Home Management;Moist Heat;Gait training;Stair training;DME Instruction;Functional mobility training;Therapeutic activities;Therapeutic exercise;Balance training;Neuromuscular re-education;Patient/family education;Orthotic Fit/Training    PT Next Visit Plan How is pt doing with RW? work on standing balance with knees straight and cont balance activities in // bars and stairs; continue interval training strengthening to ankles, hips and kness with progressive weights,  practice stair training strategies,  teach HEP and progress    PT Home Exercise Plan standing ankle dorsi and plantar flexion    Consulted and Agree with Plan of Care Patient;Family member/caregiver  Family Member Consulted Daughter, [place]             Patient will benefit from skilled therapeutic intervention in order to improve the following deficits and impairments:  Abnormal gait, Decreased balance, Decreased endurance, Decreased mobility, Difficulty walking, Impaired sensation, Decreased activity tolerance, Decreased safety awareness, Decreased strength, Postural dysfunction, Pain  Visit Diagnosis: Muscle weakness (generalized)  Unspecified disturbances of skin sensation  History of falling  Abnormal posture  Difficulty in walking, not elsewhere classified  Balance disorder  Chronic pain of left knee  Chronic pain of right knee  Abnormality of gait due to impairment of balance     Problem List Patient Active Problem List   Diagnosis Date Noted   Neuropathy 10/27/2020   Abnormal gait 06/22/2020   Age-related osteoporosis without current pathological fracture 06/22/2020   Anxiety 06/22/2020   Hardening of the aorta (main artery of the  heart) ([place]) 06/22/2020   Malignant lymphoma, non-Hodgkin's ([place]) 06/22/2020   Mild cognitive disorder 06/22/2020   Non-pressure chronic ulcer of unspecified part of unspecified lower leg with unspecified severity ([place]) 06/22/2020   Primary insomnia 06/22/2020   Pulmonary nodule 06/22/2020   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   Abnormal chest x-ray 12/31/2019   Cough 12/31/2019   Port-A-Cath in place 06/04/2019   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[place]) 04/14/2019   Counseling regarding advance care planning and goals of care 04/14/2019   Lymphadenopathy of head and neck 03/17/2019   Lymph nodes enlarged 03/17/2019   History of pulmonary embolus (PE) 03/17/2019   Acute pulmonary embolism ([place]) 03/06/2019   Diffuse lymphadenopathy 03/06/2019   Pleural effusion 03/06/2019   Pruritus 02/06/2019   Pruritic rash 02/06/2019   Angio-edema 02/06/2019   Shortness of breath 02/06/2019   Osteopenia 12/18/2013   Heart block AV first degree 12/18/2013   Breast cancer of upper-inner quadrant of right female breast ([place]) 03/19/2012   Right knee DJD 08/18/2011   Hypothyroidism    Hypertension    PONV (postoperative nausea and vomiting)    Hyperlipemia     Otelia Limes, PTA 12/06/2020, 4:54 PM  [place], [place], [address] Phone: [phone]   Fax:  [phone]  Name: Sarah [name] MRN: [id number] Date of Birth: 10/18/1932

## 2020-12-09 DIAGNOSIS — R5381 Other malaise: Secondary | ICD-10-CM | POA: Diagnosis not present

## 2020-12-09 DIAGNOSIS — M25512 Pain in left shoulder: Secondary | ICD-10-CM | POA: Diagnosis not present

## 2020-12-09 DIAGNOSIS — M25511 Pain in right shoulder: Secondary | ICD-10-CM | POA: Diagnosis not present

## 2020-12-12 ENCOUNTER — Encounter: Payer: Self-pay | Admitting: Hematology

## 2020-12-14 ENCOUNTER — Other Ambulatory Visit: Payer: Self-pay | Admitting: Hematology

## 2020-12-14 DIAGNOSIS — C8440 Peripheral T-cell lymphoma, not classified, unspecified site: Secondary | ICD-10-CM

## 2020-12-14 DIAGNOSIS — M25512 Pain in left shoulder: Secondary | ICD-10-CM | POA: Diagnosis not present

## 2020-12-15 ENCOUNTER — Other Ambulatory Visit: Payer: Self-pay

## 2020-12-15 ENCOUNTER — Ambulatory Visit: Payer: Medicare Other | Admitting: Physical Therapy

## 2020-12-15 DIAGNOSIS — R2689 Other abnormalities of gait and mobility: Secondary | ICD-10-CM | POA: Diagnosis not present

## 2020-12-15 DIAGNOSIS — Z9181 History of falling: Secondary | ICD-10-CM | POA: Diagnosis not present

## 2020-12-15 DIAGNOSIS — M25562 Pain in left knee: Secondary | ICD-10-CM

## 2020-12-15 DIAGNOSIS — R293 Abnormal posture: Secondary | ICD-10-CM | POA: Diagnosis not present

## 2020-12-15 DIAGNOSIS — R209 Unspecified disturbances of skin sensation: Secondary | ICD-10-CM | POA: Diagnosis not present

## 2020-12-15 DIAGNOSIS — M6281 Muscle weakness (generalized): Secondary | ICD-10-CM | POA: Diagnosis not present

## 2020-12-15 DIAGNOSIS — R262 Difficulty in walking, not elsewhere classified: Secondary | ICD-10-CM

## 2020-12-15 DIAGNOSIS — G8929 Other chronic pain: Secondary | ICD-10-CM

## 2020-12-15 NOTE — Therapy (Signed)
[place], [place], [address] Phone: [phone]   Fax:  [phone]  Physical Therapy Treatment  Patient Details  Name: Sarah [name] MRN: [id number] Date of Birth: [birth year] Referring Provider (PT): Dr. Irene Limbo   Encounter Date: 12/15/2020   PT End of Session - 12/15/20 1620     Visit Number 8    Number of Visits 25    Date for PT Re-Evaluation 01/14/21    PT Start Time 1410    PT Stop Time 1450    PT Time Calculation (min) 40 min    Activity Tolerance Patient tolerated treatment well             Past Medical History:  Diagnosis Date   Allergy    codeine, thiazides   Anxiety    new dx   Arthritis    Breast cancer ([place]) 03/14/12   bx=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,ER/PR=+,upper inner quad   Cancer ([place])    breast   Cataract    DVT (deep venous thrombosis) ([place]) 02/2019   left leg   Dyspnea    Edema    both legs feet and toe, abdomen   Glaucoma    laser treated years ago   HOH (hard of hearing)    Hyperlipemia    Hypertension    Hypothyroidism    Pancreatic cyst    benign   PONV (postoperative nausea and vomiting)    Pulmonary embolism ([place]) 02/2019   bilateral    Radiation 06/11/2012-07/12/2012   17 sessions 4250 cGy, 3 sessions 750 cGy   Vertigo    Wears glasses    Wears partial dentures    partial upper    Past Surgical History:  Procedure Laterality Date   ABDOMINAL HYSTERECTOMY  1966   1/2 ovary left in    [place]   lumpectomy-lt   CATARACT EXTRACTION     b/l   COLONOSCOPY     EXCISION MASS NECK Left 03/17/2019    EXCISION MASS NECK (Left Neck)   EXCISION MASS NECK Left 03/17/2019   Procedure: EXCISION MASS NECK;  Surgeon: Helayne Seminole, MD;  Location: [place];  Service: ENT;  Laterality: Left;   EYE SURGERY Bilateral    bilateral cataract removal   IR IMAGING GUIDED PORT INSERTION  04/23/2019   JOINT REPLACEMENT   2013   rt total knee   JOINT REPLACEMENT  1995   lt total knee   PARTIAL MASTECTOMY WITH NEEDLE LOCALIZATION  04/09/2012   Procedure: PARTIAL MASTECTOMY WITH NEEDLE LOCALIZATION;  Surgeon: Adin Hector, MD;  Location: [place];  Service: General;  Laterality: Right;   SKIN BIOPSY Left 03/17/2019   LEFT THIGH   SKIN BIOPSY Left 03/17/2019   Procedure: Skin Biopsy Left Thigh;  Surgeon: Helayne Seminole, MD;  Location: [facility];  Service: ENT;  Laterality: Left;   TONSILLECTOMY     TOTAL KNEE ARTHROPLASTY  08/16/2011   Procedure: TOTAL KNEE ARTHROPLASTY;  Surgeon: Ninetta Lights, MD;  Location: [place];  Service: Orthopedics;  Laterality: Right;    There were no vitals filed for this visit.   Subjective Assessment - 12/15/20 1416     Subjective Pt/Dtr report pt has been to the doctor about shoulder issues and she feels that her legs have been weaker since starting PT (?) She also states she has not been having pain in her legs since she has not been to PT  in a few weeks. She was encouraged to use her RW when she gets to her new independent living facility and that it will be good for her to walk more distances when she gets there and goes to the dining room and to get mail, etc.    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 She gets chemo once every 3 weeks. Past history includes bilateral TKR., right breast cancer 03/14/2012    Patient Stated Goals to walk without a cane    Currently in Pain? No/denies                [facility] PT Assessment - 12/15/20 0001       Assessment   Medical Diagnosis Angioimmunoblastic T cell lymphoma    Referring Provider (PT) Dr. Irene Limbo    Onset Date/Surgical Date 03/24/19      Prior Function   Level of Independence Independent                           [facility] Adult PT Treatment/Exercise - 12/15/20 0001       High Level Balance    High Level Balance Comments worked on sit to stand from high mat with feet parallel and also staggered stance for several reps and pt was able to perform better at end of session with less backward lean on mat.  Static standing balance with good posture for 5 minutes with pts palms on PTs palms with PT standing in front of her, Pt with head turnes and arms back and forth and altenate arms lifted all without balance loss      Lumbar Exercises: Supine   Bridge 5 reps    Bridge with [employer] 5 reps    Other Supine Lumbar Exercises assisted knee to chest      Knee/Hip Exercises: Supine   Short Arc Quad Sets Strengthening;Right;Left;3 sets;5 reps    Short Arc Quad Sets Limitations 3# cuff weight on each ankle      Shoulder Exercises: Supine   Other Supine Exercises bilateral chest press with 1# in each hand 3 rounds of 5 reps                      PT Short Term Goals - 12/15/20 1624       PT SHORT TERM GOAL #1   Title Pt will be independent with HEP within 3 weeks in order to demonstrate autonomy of care.    Time 4    Period Weeks    Status On-going      PT SHORT TERM GOAL #2   Title Pt will 3/5 dorsiflexion of left ankle to decrease risk of fall while walking    Baseline 2-/5 on 10/14/2020    Time 4    Period Weeks    Status On-going      PT SHORT TERM GOAL #3   Title Pt will demonstrate safe technique in getting up and down 8 steps using handrail as neede    Time 4    Period Weeks    Status On-going               PT Long Term Goals - 12/15/20 1625       PT LONG TERM GOAL #1   Title Pt wil increase sit to stand to 8 in 30 seconds indicating an increase in fuctional strengthe  Baseline 4 in 30 seconds    Time 8    Period Weeks    Status On-going      PT LONG TERM GOAL #2   Title Pt will decrease TUG time by 3 seconds indicating and decrease in risk for fall    Period Weeks    Status On-going      PT LONG TERM GOAL #3   Title Pt will be able  to walk 50 feet with cane and holding 5 pounds weight so that she safely do household chores.    Time 8    Period Weeks    Status On-going      PT LONG TERM GOAL #4   Title Pt can walk 50 feet without the use of her cane so that she can safely walk across the rooms in her home    Time 8    Period Weeks    Status On-going                   Plan - 12/15/20 1621     Clinical Impression Statement Pt continues having difficulty with her balance and ambulation.  She has been to see the orthopedic doctor to see about problems with her left shoulder, but that is much better now. She is in the process of moving to a one level independent living apartment and hopes that will be much better for her. She wants to continue PT so renewal was sent today    Personal Factors and Comorbidities Comorbidity 3+    Comorbidities lymphoma with chemotherapy., bilateral TKR, osteopenia    Examination-Activity Limitations Stand;Stairs;Locomotion Level;Lift;Squat;Transfers    Stability/Clinical Decision Making Stable/Uncomplicated    Rehab Potential Good    PT Frequency 2x / week    PT Duration 8 weeks    PT Treatment/Interventions ADLs/Self Care Home Management;Moist Heat;Gait training;Stair training;DME Instruction;Functional mobility training;Therapeutic activities;Therapeutic exercise;Balance training;Neuromuscular re-education;Patient/family education;Orthotic Fit/Training    PT Next Visit Plan How is pt doing with RW? work on standing balance with knees straight and cont balance activities in // bars and stairs; continue interval training strengthening to ankles, hips and kness with progressive weights,  practice stair training strategies,  teach HEP and progress    Consulted and Agree with Plan of Care Patient    Family Member Consulted Daughter, [name]             Patient will benefit from skilled therapeutic intervention in order to improve the following deficits and impairments:  Abnormal  gait, Decreased balance, Decreased endurance, Decreased mobility, Difficulty walking, Impaired sensation, Decreased activity tolerance, Decreased safety awareness, Decreased strength, Postural dysfunction, Pain  Visit Diagnosis: Muscle weakness (generalized) - Plan: PT plan of care cert/re-cert  Unspecified disturbances of skin sensation - Plan: PT plan of care cert/re-cert  History of falling - Plan: PT plan of care cert/re-cert  Abnormal posture - Plan: PT plan of care cert/re-cert  Difficulty in walking, not elsewhere classified - Plan: PT plan of care cert/re-cert  Balance disorder - Plan: PT plan of care cert/re-cert  Chronic pain of left knee - Plan: PT plan of care cert/re-cert  Chronic pain of right knee - Plan: PT plan of care cert/re-cert     Problem List Patient Active Problem List   Diagnosis Date Noted   Neuropathy 10/27/2020   Abnormal gait 06/22/2020   Age-related osteoporosis without current pathological fracture 06/22/2020   Anxiety 06/22/2020   Hardening of the aorta (main artery of the heart) ([place]) 06/22/2020  Malignant lymphoma, non-Hodgkin's ([place]) 06/22/2020   Mild cognitive disorder 06/22/2020   Non-pressure chronic ulcer of unspecified part of unspecified lower leg with unspecified severity ([place]) 06/22/2020   Primary insomnia 06/22/2020   Pulmonary nodule 06/22/2020   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   Abnormal chest x-ray 12/31/2019   Cough 12/31/2019   Port-A-Cath in place 06/04/2019   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[place]) 04/14/2019   Counseling regarding advance care planning and goals of care 04/14/2019   Lymphadenopathy of head and neck 03/17/2019   Lymph nodes enlarged 03/17/2019   History of pulmonary embolus (PE) 03/17/2019   Acute pulmonary embolism ([place]) 03/06/2019   Diffuse lymphadenopathy 03/06/2019   Pleural effusion 03/06/2019   Pruritus 02/06/2019   Pruritic rash 02/06/2019   Angio-edema 02/06/2019   Shortness of breath 02/06/2019    Osteopenia 12/18/2013   Heart block AV first degree 12/18/2013   Breast cancer of upper-inner quadrant of right female breast ([place]) 03/19/2012   Right knee DJD 08/18/2011   Hypothyroidism    Hypertension    PONV (postoperative nausea and vomiting)    Hyperlipemia    Donato Heinz. Owens Shark PT  Norwood Levo 12/15/2020, 4:27 PM  [place], [place], [address] Phone: [phone]   Fax:  [phone]  Name: CORIANNA AVALLONE MRN: [id number] Date of Birth: 27-Oct-1932

## 2020-12-16 ENCOUNTER — Encounter: Payer: Self-pay | Admitting: Hematology

## 2020-12-16 ENCOUNTER — Other Ambulatory Visit: Payer: Self-pay

## 2020-12-16 DIAGNOSIS — C8440 Peripheral T-cell lymphoma, not classified, unspecified site: Secondary | ICD-10-CM

## 2020-12-16 MED ORDER — LORAZEPAM 0.5 MG PO TABS: 0.5000 mg | ORAL_TABLET | Freq: Two times a day (BID) | ORAL | 0 refills | Status: DC | PRN

## 2020-12-22 ENCOUNTER — Ambulatory Visit: Payer: Medicare Other | Attending: Hematology | Admitting: Physical Therapy

## 2020-12-22 ENCOUNTER — Other Ambulatory Visit: Payer: Self-pay

## 2020-12-22 DIAGNOSIS — M25562 Pain in left knee: Secondary | ICD-10-CM | POA: Insufficient documentation

## 2020-12-22 DIAGNOSIS — Z9181 History of falling: Secondary | ICD-10-CM

## 2020-12-22 DIAGNOSIS — M25561 Pain in right knee: Secondary | ICD-10-CM | POA: Insufficient documentation

## 2020-12-22 DIAGNOSIS — R2689 Other abnormalities of gait and mobility: Secondary | ICD-10-CM

## 2020-12-22 DIAGNOSIS — G8929 Other chronic pain: Secondary | ICD-10-CM | POA: Insufficient documentation

## 2020-12-22 DIAGNOSIS — R262 Difficulty in walking, not elsewhere classified: Secondary | ICD-10-CM

## 2020-12-22 DIAGNOSIS — R293 Abnormal posture: Secondary | ICD-10-CM | POA: Diagnosis not present

## 2020-12-22 DIAGNOSIS — M6281 Muscle weakness (generalized): Secondary | ICD-10-CM | POA: Diagnosis not present

## 2020-12-22 DIAGNOSIS — R209 Unspecified disturbances of skin sensation: Secondary | ICD-10-CM | POA: Diagnosis not present

## 2020-12-22 NOTE — Therapy (Signed)
[place], [place], [address] Phone: [phone]   Fax:  [phone]  Physical Therapy Treatment  Patient Details  Name: Sarah [name] MRN: [id number] Date of Birth: [birth year] Referring Provider (PT): Dr. Irene Limbo   Encounter Date: 12/22/2020   PT End of Session - 12/22/20 1613     Visit Number 9    Number of Visits 25    Date for PT Re-Evaluation 01/14/21    PT Start Time 1500    PT Stop Time 9470    PT Time Calculation (min) 45 min    Activity Tolerance Patient tolerated treatment well    Behavior During Therapy [facility] for tasks assessed/performed             Past Medical History:  Diagnosis Date   Allergy    codeine, thiazides   Anxiety    new dx   Arthritis    Breast cancer ([place]) 03/14/12   bx=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,ER/PR=+,upper inner quad   Cancer ([place])    breast   Cataract    DVT (deep venous thrombosis) ([place]) 02/2019   left leg   Dyspnea    Edema    both legs feet and toe, abdomen   Glaucoma    laser treated years ago   HOH (hard of hearing)    Hyperlipemia    Hypertension    Hypothyroidism    Pancreatic cyst    benign   PONV (postoperative nausea and vomiting)    Pulmonary embolism ([place]) 02/2019   bilateral    Radiation 06/11/2012-07/12/2012   17 sessions 4250 cGy, 3 sessions 750 cGy   Vertigo    Wears glasses    Wears partial dentures    partial upper    Past Surgical History:  Procedure Laterality Date   ABDOMINAL HYSTERECTOMY  1966   1/2 ovary left in    [place]   lumpectomy-lt   CATARACT EXTRACTION     b/l   COLONOSCOPY     EXCISION MASS NECK Left 03/17/2019    EXCISION MASS NECK (Left Neck)   EXCISION MASS NECK Left 03/17/2019   Procedure: EXCISION MASS NECK;  Surgeon: Helayne Seminole, MD;  Location: [facility];  Service: ENT;  Laterality: Left;   EYE SURGERY Bilateral    bilateral cataract removal   IR  IMAGING GUIDED PORT INSERTION  04/23/2019   JOINT REPLACEMENT  2013   rt total knee   JOINT REPLACEMENT  1995   lt total knee   PARTIAL MASTECTOMY WITH NEEDLE LOCALIZATION  04/09/2012   Procedure: PARTIAL MASTECTOMY WITH NEEDLE LOCALIZATION;  Surgeon: Adin Hector, MD;  Location: [facility];  Service: General;  Laterality: Right;   SKIN BIOPSY Left 03/17/2019   LEFT THIGH   SKIN BIOPSY Left 03/17/2019   Procedure: Skin Biopsy Left Thigh;  Surgeon: Helayne Seminole, MD;  Location: [facility];  Service: ENT;  Laterality: Left;   TONSILLECTOMY     TOTAL KNEE ARTHROPLASTY  08/16/2011   Procedure: TOTAL KNEE ARTHROPLASTY;  Surgeon: Ninetta Lights, MD;  Location: [facility];  Service: Orthopedics;  Laterality: Right;    There were no vitals filed for this visit.   Subjective Assessment - 12/22/20 1506     Subjective Pt states she had a fall last night at her new apartment. She was taking a plate to the sink and the next thing she knew she was on the floor.She was there  by herself but was able to get to pull the string and someone came to help her.    Patient is accompained by: Family member    Pertinent History pt with Angioimmunoblastic T cell lymphoma  with treatment starting  November 2020 and has had diffiuclty since. She gets chemo once every 3 weeks. Past history includes bilateral TKR., right breast cancer 03/14/2012    Patient Stated Goals to walk without a cane    Currently in Pain? No/denies                               [facility] Adult PT Treatment/Exercise - 12/22/20 0001       Bed Mobility   Bed Mobility Rolling Right;Rolling Left;Sit to Supine;Supine to Sit    Rolling Right Supervision/verbal cueing    Rolling Left Supervision/Verbal cueing    Supine to Sit Contact Guard/Touching assist   pt slow with rolling and was not able to get all the way up on her side.  She was dizzy upon sitting up and needed several minutes to recover.   Sit to Supine  Contact Guard/Touching assist      Transfers   Sit to Stand 6: Modified independent (Device/Increase time)   needs chair arms to push up     Ambulation/Gait   Ambulation/Gait Yes    Ambulation/Gait Assistance 4: Min guard    Gait Comments Pt instructed to focus on something with her eyes to focus and did better with one cane and one dowel rod for support.  She was encouraged again to use her walker and daughter reinforced this idea, but pt still said she thinks it is ok to use her cane      Exercises   Exercises Shoulder;Knee/Hip;Other Exercises    Other Exercises  warm  up of deep breathing.  pt with very limited neck ROM with one rep trial      Lumbar Exercises: Supine   Glut Set 10 reps    Bridge 5 reps    Straight Leg Raise 10 reps    Other Supine Lumbar Exercises marching      Knee/Hip Exercises: Supine   Short Arc Quad Sets Strengthening;Right;Left;3 sets;5 reps    Short Arc Quad Sets Limitations 3# cuff weight on each ankle    Hip Adduction Isometric Strengthening;Right;Left;5 reps      Knee/Hip Exercises: Sidelying   Hip ABduction AROM;Both;1 set;5 reps    Clams 5 reps with each leg      Shoulder Exercises: Supine   Flexion Strengthening;Both;15 reps   chest press with dowel, 5 reps with 3# then weight removed as pt states that she had some shoulder pain for remaining 2 sets of 5   Other Supine Exercises elbow flexion and extension                      PT Short Term Goals - 12/15/20 1624       PT SHORT TERM GOAL #1   Title Pt will be independent with HEP within 3 weeks in order to demonstrate autonomy of care.    Time 4    Period Weeks    Status On-going      PT SHORT TERM GOAL #2   Title Pt will 3/5 dorsiflexion of left ankle to decrease risk of fall while walking    Baseline 2-/5 on 10/14/2020    Time 4    Period Weeks  Status On-going      PT SHORT TERM GOAL #3   Title Pt will demonstrate safe technique in getting up and down 8 steps using  handrail as neede    Time 4    Period Weeks    Status On-going               PT Long Term Goals - 12/15/20 1625       PT LONG TERM GOAL #1   Title Pt wil increase sit to stand to 8 in 30 seconds indicating an increase in fuctional strengthe    Baseline 4 in 30 seconds    Time 8    Period Weeks    Status On-going      PT LONG TERM GOAL #2   Title Pt will decrease TUG time by 3 seconds indicating and decrease in risk for fall    Period Weeks    Status On-going      PT LONG TERM GOAL #3   Title Pt will be able to walk 50 feet with cane and holding 5 pounds weight so that she safely do household chores.    Time 8    Period Weeks    Status On-going      PT LONG TERM GOAL #4   Title Pt can walk 50 feet without the use of her cane so that she can safely walk across the rooms in her home    Time 8    Period Weeks    Status On-going                   Plan - 12/22/20 1613     Clinical Impression Statement Pt is in the process of moving into her new apartment and had a fall last night.  She appears to be stressed by all that is involved with move and the lack of sensation in her feet and difficulty walking.  She did well with mat exercises today and was encouraged to use her walker    Personal Factors and Comorbidities Comorbidity 3+    Comorbidities lymphoma with chemotherapy., bilateral TKR, osteopenia    Examination-Activity Limitations Stand;Stairs;Locomotion Level;Lift;Squat;Transfers    Examination-Participation Restrictions Volunteer;Church;Laundry    Stability/Clinical Decision Making Stable/Uncomplicated    Rehab Potential Good    PT Frequency 2x / week    PT Duration 8 weeks    PT Treatment/Interventions ADLs/Self Care Home Management;Moist Heat;Gait training;Stair training;DME Instruction;Functional mobility training;Therapeutic activities;Therapeutic exercise;Balance training;Neuromuscular re-education;Patient/family education;Orthotic Fit/Training    PT  Next Visit Plan How is pt doing with RW? work on standing balance with knees straight and cont balance activities in // bars and stairs; continue interval training strengthening to ankles, hips and kness with progressive weights,  practice stair training strategies,  teach HEP and progress    Consulted and Agree with Plan of Care Patient    Family Member Consulted Daughter, [name]             Patient will benefit from skilled therapeutic intervention in order to improve the following deficits and impairments:  Abnormal gait, Decreased balance, Decreased endurance, Decreased mobility, Difficulty walking, Impaired sensation, Decreased activity tolerance, Decreased safety awareness, Decreased strength, Postural dysfunction, Pain  Visit Diagnosis: Muscle weakness (generalized)  Unspecified disturbances of skin sensation  History of falling  Abnormal posture  Difficulty in walking, not elsewhere classified  Balance disorder     Problem List Patient Active Problem List   Diagnosis Date Noted   Neuropathy 10/27/2020  Abnormal gait 06/22/2020   Age-related osteoporosis without current pathological fracture 06/22/2020   Anxiety 06/22/2020   Hardening of the aorta (main artery of the heart) ([place]) 06/22/2020   Malignant lymphoma, non-Hodgkin's ([place]) 06/22/2020   Mild cognitive disorder 06/22/2020   Non-pressure chronic ulcer of unspecified part of unspecified lower leg with unspecified severity ([place]) 06/22/2020   Primary insomnia 06/22/2020   Pulmonary nodule 06/22/2020   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   Abnormal chest x-ray 12/31/2019   Cough 12/31/2019   Port-A-Cath in place 06/04/2019   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[place]) 04/14/2019   Counseling regarding advance care planning and goals of care 04/14/2019   Lymphadenopathy of head and neck 03/17/2019   Lymph nodes enlarged 03/17/2019   History of pulmonary embolus (PE) 03/17/2019   Acute pulmonary embolism ([place]) 03/06/2019    Diffuse lymphadenopathy 03/06/2019   Pleural effusion 03/06/2019   Pruritus 02/06/2019   Pruritic rash 02/06/2019   Angio-edema 02/06/2019   Shortness of breath 02/06/2019   Osteopenia 12/18/2013   Heart block AV first degree 12/18/2013   Breast cancer of upper-inner quadrant of right female breast ([place]) 03/19/2012   Right knee DJD 08/18/2011   Hypothyroidism    Hypertension    PONV (postoperative nausea and vomiting)    Hyperlipemia    Donato Heinz. Owens Shark PT  Nor[facility] Levo 12/22/2020, [place], [place], [address] Phone: [phone]   Fax:  [phone]  Name: Sarah [name] MRN: [id number] Date of Birth: 1932/12/02

## 2020-12-28 ENCOUNTER — Ambulatory Visit: Payer: Medicare Other | Admitting: Physical Therapy

## 2020-12-28 ENCOUNTER — Encounter: Payer: Self-pay | Admitting: Physical Therapy

## 2020-12-28 ENCOUNTER — Other Ambulatory Visit: Payer: Self-pay

## 2020-12-28 DIAGNOSIS — R209 Unspecified disturbances of skin sensation: Secondary | ICD-10-CM

## 2020-12-28 DIAGNOSIS — R262 Difficulty in walking, not elsewhere classified: Secondary | ICD-10-CM

## 2020-12-28 DIAGNOSIS — R2689 Other abnormalities of gait and mobility: Secondary | ICD-10-CM

## 2020-12-28 DIAGNOSIS — R293 Abnormal posture: Secondary | ICD-10-CM

## 2020-12-28 DIAGNOSIS — G8929 Other chronic pain: Secondary | ICD-10-CM

## 2020-12-28 DIAGNOSIS — M6281 Muscle weakness (generalized): Secondary | ICD-10-CM | POA: Diagnosis not present

## 2020-12-28 DIAGNOSIS — Z9181 History of falling: Secondary | ICD-10-CM | POA: Diagnosis not present

## 2020-12-28 NOTE — Therapy (Signed)
[place], [place], [address] Phone: [phone]   Fax:  [phone]  Physical Therapy Treatment  Patient Details  Name: Sarah [name] MRN: [id number] Date of Birth: 1932-12-03 Referring Provider (PT): Dr. Irene Limbo  Progress Note Reporting Period 10/09/2020 to 12/28/2020  See note below for Objective Data and Assessment of Progress/Goals.     Encounter Date: 12/28/2020   PT End of Session - 12/28/20 1546     Visit Number 10    Number of Visits 25    Date for PT Re-Evaluation 01/14/21    PT Start Time 1500    PT Stop Time 8850    PT Time Calculation (min) 45 min    Activity Tolerance Patient tolerated treatment well    Behavior During Therapy WFL for tasks assessed/performed             Past Medical History:  Diagnosis Date   Allergy    codeine, thiazides   Anxiety    new dx   Arthritis    Breast cancer ([place]) 03/14/12   bx=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,ER/PR=+,upper inner quad   Cancer ([place])    breast   Cataract    DVT (deep venous thrombosis) ([place]) 02/2019   left leg   Dyspnea    Edema    both legs feet and toe, abdomen   Glaucoma    laser treated years ago   HOH (hard of hearing)    Hyperlipemia    Hypertension    Hypothyroidism    Pancreatic cyst    benign   PONV (postoperative nausea and vomiting)    Pulmonary embolism ([place]) 02/2019   bilateral    Radiation 06/11/2012-07/12/2012   17 sessions 4250 cGy, 3 sessions 750 cGy   Vertigo    Wears glasses    Wears partial dentures    partial upper    Past Surgical History:  Procedure Laterality Date   ABDOMINAL HYSTERECTOMY  1966   1/2 ovary left in    [place]   lumpectomy-lt   CATARACT EXTRACTION     b/l   COLONOSCOPY     EXCISION MASS NECK Left 03/17/2019    EXCISION MASS NECK (Left Neck)   EXCISION MASS NECK Left 03/17/2019   Procedure: EXCISION MASS NECK;  Surgeon:  Helayne Seminole, MD;  Location: [facility];  Service: ENT;  Laterality: Left;   EYE SURGERY Bilateral    bilateral cataract removal   IR IMAGING GUIDED PORT INSERTION  04/23/2019   JOINT REPLACEMENT  2013   rt total knee   JOINT REPLACEMENT  1995   lt total knee   PARTIAL MASTECTOMY WITH NEEDLE LOCALIZATION  04/09/2012   Procedure: PARTIAL MASTECTOMY WITH NEEDLE LOCALIZATION;  Surgeon: Adin Hector, MD;  Location: [facility];  Service: General;  Laterality: Right;   SKIN BIOPSY Left 03/17/2019   LEFT THIGH   SKIN BIOPSY Left 03/17/2019   Procedure: Skin Biopsy Left Thigh;  Surgeon: Helayne Seminole, MD;  Location: [facility];  Service: ENT;  Laterality: Left;   TONSILLECTOMY     TOTAL KNEE ARTHROPLASTY  08/16/2011   Procedure: TOTAL KNEE ARTHROPLASTY;  Surgeon: Ninetta Lights, MD;  Location: [facility];  Service: Orthopedics;  Laterality: Right;    There were no vitals filed for this visit.   Subjective Assessment - 12/28/20 1504     Subjective Pt says she has a lot going on.  She agress to  exercise plan for today    Pertinent History pt with Angioimmunoblastic T cell lymphoma  with treatment starting  November 2020 and has had diffiuclty since. She gets chemo once every 3 weeks. Past history includes bilateral TKR., right breast cancer 03/14/2012    Currently in Pain? No/denies                               [facility] Adult PT Treatment/Exercise - 12/28/20 0001       Ambulation/Gait   Ambulation/Gait Yes    Ambulation/Gait Assistance 4: Min assist    Ambulation Distance (Feet) 300 Feet   x 2   Gait Comments hand hold assist with cues to slow down.   decreased heel strike continues to be evident     High Level Balance   High Level Balance Activities Side stepping;Marching forwards;Weight-shifting turns;Other (comment)    High Level Balance Comments mini squats, backward taps, standing hip abduction 5-10 reps,all in parallell bars. Pt able to follow  cues to slow down and bring chest up for better posture.      Lumbar Exercises: Supine   Other Supine Lumbar Exercises marching. QL walkng (scooting) forward and back -pt with difficutly with this                      PT Short Term Goals - 12/28/20 1558       PT SHORT TERM GOAL #1   Title Pt will be independent with HEP within 3 weeks in order to demonstrate autonomy of care.    Time 4    Period Weeks    Status On-going      PT SHORT TERM GOAL #2   Title Pt will 3/5 dorsiflexion of left ankle to decrease risk of fall while walking    Baseline 2-/5 on 10/14/2020    Period Weeks    Status On-going      PT SHORT TERM GOAL #3   Title Pt will demonstrate safe technique in getting up and down 8 steps using handrail as neede    Time 4    Period Weeks    Status On-going               PT Long Term Goals - 12/28/20 1558       PT LONG TERM GOAL #1   Title Pt wil increase sit to stand to 8 in 30 seconds indicating an increase in fuctional strengthe    Baseline 4 in 30 seconds    Period Weeks    Status On-going      PT LONG TERM GOAL #2   Title Pt will decrease TUG time by 3 seconds indicating and decrease in risk for fall    Time 8    Period Weeks    Status On-going      PT LONG TERM GOAL #3   Title Pt will be able to walk 50 feet with cane and holding 5 pounds weight so that she safely do household chores.    Time 8    Period Weeks    Status On-going      PT LONG TERM GOAL #4   Title Pt can walk 50 feet without the use of her cane so that she can safely walk across the rooms in her home    Time 8    Period Weeks    Status On-going  Plan - 12/28/20 1551     Clinical Impression Statement Pt continues to stuggle with walking and feels like her left leg is going to give out despite 10 session of PT. She still has very decreased sensation in both her feet that limits her walking. She is in the process of moving into an independent  living apartment. Her large home will be put up on the market soon and she is trying to get rid of some of her things. She has not been able to find out if there are exercise opportunities at her new living situation, but her daughter thinks there are some there. Treatment today focused on walking with cane and one hand hold assist ( suggested pt run her fingers along the wall for balance oppsite the cane ) , but walking with the walker is preferable. She did well with side stepping and marching in the parallel bars and as able to slow down with cues for more strength challenges to her legs . Pt pleased with her activities today. She will come to PT one time a week making sure she is active the other days of the week.    Personal Factors and Comorbidities Comorbidity 3+    Comorbidities lymphoma with chemotherapy., bilateral TKR, osteopenia    Examination-Activity Limitations Stand;Stairs;Locomotion Level;Lift;Squat;Transfers    Examination-Participation Restrictions Volunteer;Church;Laundry    Stability/Clinical Decision Making Stable/Uncomplicated    Rehab Potential Good    PT Frequency 2x / week    PT Duration 8 weeks    PT Treatment/Interventions ADLs/Self Care Home Management;Moist Heat;Gait training;Stair training;DME Instruction;Functional mobility training;Therapeutic activities;Therapeutic exercise;Balance training;Neuromuscular re-education;Patient/family education;Orthotic Fit/Training    PT Next Visit Plan continue  work on standing balance with knees straight and cont balance activities in // bars and stairs; continue interval training strengthening to ankles, hips and kness with progressive weights,  practice stair training strategies,  teach HEP and progress    PT Home Exercise Plan standing ankle dorsi and plantar flexion    Consulted and Agree with Plan of Care Patient    Family Member Consulted Daughter, [name]             Patient will benefit from skilled therapeutic  intervention in order to improve the following deficits and impairments:  Abnormal gait, Decreased balance, Decreased endurance, Decreased mobility, Difficulty walking, Impaired sensation, Decreased activity tolerance, Decreased safety awareness, Decreased strength, Postural dysfunction, Pain  Visit Diagnosis: Muscle weakness (generalized)  Unspecified disturbances of skin sensation  History of falling  Abnormal posture  Difficulty in walking, not elsewhere classified  Balance disorder  Chronic pain of left knee  Abnormality of gait due to impairment of balance     Problem List Patient Active Problem List   Diagnosis Date Noted   Neuropathy 10/27/2020   Abnormal gait 06/22/2020   Age-related osteoporosis without current pathological fracture 06/22/2020   Anxiety 06/22/2020   Hardening of the aorta (main artery of the heart) ([place]) 06/22/2020   Malignant lymphoma, non-Hodgkin's ([place]) 06/22/2020   Mild cognitive disorder 06/22/2020   Non-pressure chronic ulcer of unspecified part of unspecified lower leg with unspecified severity ([place]) 06/22/2020   Primary insomnia 06/22/2020   Pulmonary nodule 06/22/2020   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   Abnormal chest x-ray 12/31/2019   Cough 12/31/2019   Port-A-Cath in place 06/04/2019   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[place]) 04/14/2019   Counseling regarding advance care planning and goals of care 04/14/2019   Lymphadenopathy of head and neck 03/17/2019   Lymph nodes enlarged 03/17/2019  History of pulmonary embolus (PE) 03/17/2019   Acute pulmonary embolism ([place]) 03/06/2019   Diffuse lymphadenopathy 03/06/2019   Pleural effusion 03/06/2019   Pruritus 02/06/2019   Pruritic rash 02/06/2019   Angio-edema 02/06/2019   Shortness of breath 02/06/2019   Osteopenia 12/18/2013   Heart block AV first degree 12/18/2013   Breast cancer of upper-inner quadrant of right female breast ([place]) 03/19/2012   Right knee DJD 08/18/2011   Hypothyroidism     Hypertension    PONV (postoperative nausea and vomiting)    Hyperlipemia    Donato Heinz. Owens Shark PT  [facility] Levo 12/28/2020, 4:01 PM  [place], [place], [address] Phone: [phone]   Fax:  [phone]  Name: Sarah [name] MRN: [id number] Date of Birth: 03/01/1933

## 2020-12-30 ENCOUNTER — Encounter: Payer: Medicare Other | Admitting: Physical Therapy

## 2021-01-04 ENCOUNTER — Ambulatory Visit: Payer: Medicare Other | Admitting: Physical Therapy

## 2021-01-04 ENCOUNTER — Other Ambulatory Visit: Payer: Self-pay

## 2021-01-04 ENCOUNTER — Encounter: Payer: Self-pay | Admitting: Physical Therapy

## 2021-01-04 DIAGNOSIS — M6281 Muscle weakness (generalized): Secondary | ICD-10-CM | POA: Diagnosis not present

## 2021-01-04 DIAGNOSIS — R262 Difficulty in walking, not elsewhere classified: Secondary | ICD-10-CM | POA: Diagnosis not present

## 2021-01-04 DIAGNOSIS — G8929 Other chronic pain: Secondary | ICD-10-CM

## 2021-01-04 DIAGNOSIS — R293 Abnormal posture: Secondary | ICD-10-CM

## 2021-01-04 DIAGNOSIS — R2689 Other abnormalities of gait and mobility: Secondary | ICD-10-CM | POA: Diagnosis not present

## 2021-01-04 DIAGNOSIS — Z9181 History of falling: Secondary | ICD-10-CM

## 2021-01-04 DIAGNOSIS — R209 Unspecified disturbances of skin sensation: Secondary | ICD-10-CM | POA: Diagnosis not present

## 2021-01-04 DIAGNOSIS — M25562 Pain in left knee: Secondary | ICD-10-CM

## 2021-01-04 NOTE — Therapy (Signed)
[place], [place], [address] Phone: [phone]   Fax:  [phone]  Physical Therapy Treatment  Patient Details  Name: Sarah [name] MRN: [id number] Date of Birth: 21-Oct-1932 Referring Provider (PT): Dr. Irene Limbo   Encounter Date: 01/04/2021   PT End of Session - 01/04/21 1550     Visit Number 11    Number of Visits 25    Date for PT Re-Evaluation 01/14/21    PT Start Time 1500    PT Stop Time 4818    PT Time Calculation (min) 45 min    Activity Tolerance Patient tolerated treatment well    Behavior During Therapy [facility] for tasks assessed/performed             Past Medical History:  Diagnosis Date   Allergy    codeine, thiazides   Anxiety    new dx   Arthritis    Breast cancer ([place]) 03/14/12   bx=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,ER/PR=+,upper inner quad   Cancer ([place])    breast   Cataract    DVT (deep venous thrombosis) ([place]) 02/2019   left leg   Dyspnea    Edema    both legs feet and toe, abdomen   Glaucoma    laser treated years ago   HOH (hard of hearing)    Hyperlipemia    Hypertension    Hypothyroidism    Pancreatic cyst    benign   PONV (postoperative nausea and vomiting)    Pulmonary embolism ([place]) 02/2019   bilateral    Radiation 06/11/2012-07/12/2012   17 sessions 4250 cGy, 3 sessions 750 cGy   Vertigo    Wears glasses    Wears partial dentures    partial upper    Past Surgical History:  Procedure Laterality Date   ABDOMINAL HYSTERECTOMY  1966   1/2 ovary left in    [place]   lumpectomy-lt   CATARACT EXTRACTION     b/l   COLONOSCOPY     EXCISION MASS NECK Left 03/17/2019    EXCISION MASS NECK (Left Neck)   EXCISION MASS NECK Left 03/17/2019   Procedure: EXCISION MASS NECK;  Surgeon: Helayne Seminole, MD;  Location: [place];  Service: ENT;  Laterality: Left;   EYE SURGERY Bilateral    bilateral cataract removal   IR  IMAGING GUIDED PORT INSERTION  04/23/2019   JOINT REPLACEMENT  2013   rt total knee   JOINT REPLACEMENT  1995   lt total knee   PARTIAL MASTECTOMY WITH NEEDLE LOCALIZATION  04/09/2012   Procedure: PARTIAL MASTECTOMY WITH NEEDLE LOCALIZATION;  Surgeon: Adin Hector, MD;  Location: [facility];  Service: General;  Laterality: Right;   SKIN BIOPSY Left 03/17/2019   LEFT THIGH   SKIN BIOPSY Left 03/17/2019   Procedure: Skin Biopsy Left Thigh;  Surgeon: Helayne Seminole, MD;  Location: [facility] OR;  Service: ENT;  Laterality: Left;   TONSILLECTOMY     TOTAL KNEE ARTHROPLASTY  08/16/2011   Procedure: TOTAL KNEE ARTHROPLASTY;  Surgeon: Ninetta Lights, MD;  Location: [facility];  Service: Orthopedics;  Laterality: Right;    There were no vitals filed for this visit.   Subjective Assessment - 01/04/21 1508     Subjective Pt is learning about some of the activities at [facility]. She is deciding what she wants to participate in    Pertinent History pt with Angioimmunoblastic T cell lymphoma  with treatment  starting  November 2020 and has had diffiuclty since. She gets chemo once every 3 weeks. Past history includes bilateral TKR., right breast cancer 03/14/2012    Patient Stated Goals to walk without a cane    Currently in Pain? No/denies   achy but no pain                              [facility] Adult PT Treatment/Exercise - 01/04/21 0001       Ambulation/Gait   Ambulation/Gait Yes    Ambulation Distance (Feet) 200 Feet    Assistive device Straight cane    Pre-Gait Activities standing glute sets, alternating foot forward weight shift with mirror posture cues, stepping forward and back    Gait Comments pt acknwledged that she would do better with her walker and that she should use her walker at [facility] because she would be able to walk further and feel safer      Posture/Postural Control   Posture Comments cues to keep good posture using mirror for visual cues       High Level Balance   High Level Balance Activities Side stepping;Direction changes    High Level Balance Comments minis1squats and side stepping at counter. Also standing at counter with UE reaching with one arm and then both arms for standing balance challenges with diminshing UE support      Exercises   Exercises Shoulder;Knee/Hip;Ankle;Other Exercises      Knee/Hip Exercises: Standing   Knee Flexion Right;Left;10 reps;Strengthening    Hip Abduction Stengthening;Right;Left;10 reps    Hip Extension Stengthening;Right;Left;10 reps    Forward Step Up Right;Left;10 reps    Forward Step Up Limitations onto step aerobics bench, pt did not feel safe enough to step up on smaller step    Functional Squat 10 reps    Functional Squat Limitations both hands on counter top      Knee/Hip Exercises: Seated   Long Arc Quad Right;Left;10 reps    Knee/Hip Flexion 10 reps with each leg    Sit to Sand 5 reps;with UE support                      PT Short Term Goals - 12/28/20 1558       PT SHORT TERM GOAL #1   Title Pt will be independent with HEP within 3 weeks in order to demonstrate autonomy of care.    Time 4    Period Weeks    Status On-going      PT SHORT TERM GOAL #2   Title Pt will 3/5 dorsiflexion of left ankle to decrease risk of fall while walking    Baseline 2-/5 on 10/14/2020    Period Weeks    Status On-going      PT SHORT TERM GOAL #3   Title Pt will demonstrate safe technique in getting up and down 8 steps using handrail as neede    Time 4    Period Weeks    Status On-going               PT Long Term Goals - 12/28/20 1558       PT LONG TERM GOAL #1   Title Pt wil increase sit to stand to 8 in 30 seconds indicating an increase in fuctional strengthe    Baseline 4 in 30 seconds    Period Weeks    Status On-going  PT LONG TERM GOAL #2   Title Pt will decrease TUG time by 3 seconds indicating and decrease in risk for fall    Time 8    Period  Weeks    Status On-going      PT LONG TERM GOAL #3   Title Pt will be able to walk 50 feet with cane and holding 5 pounds weight so that she safely do household chores.    Time 8    Period Weeks    Status On-going      PT LONG TERM GOAL #4   Title Pt can walk 50 feet without the use of her cane so that she can safely walk across the rooms in her home    Time 8    Period Weeks    Status On-going                   Plan - 01/04/21 1550     Clinical Impression Statement worked in treatment room today with strengthening, balance and gait activities.  Pt seemed to do better today and was improved in her ability to stand at counter and do acitivites with one and even both hands so she did not have UE support.  She tolerated several bouts of activities with small sitting rest breaks. Overall , appears to be improved, though she still has anxiety when she first stands and walks becasue the bottom of her feet "feel like glass"    Comorbidities lymphoma with chemotherapy., bilateral TKR, osteopenia    Examination-Activity Limitations Stand;Stairs;Locomotion Level;Lift;Squat;Transfers    Examination-Participation Restrictions Volunteer;Church;Laundry    PT Frequency 2x / week    PT Duration 8 weeks    PT Treatment/Interventions ADLs/Self Care Home Management;Moist Heat;Gait training;Stair training;DME Instruction;Functional mobility training;Therapeutic activities;Therapeutic exercise;Balance training;Neuromuscular re-education;Patient/family education;Orthotic Fit/Training    PT Next Visit Plan continue  work on standing balance with knees straight and cont balance activities in // bars and stairs; continue interval training strengthening to ankles, hips and kness with progressive weights,  practice stair training strategies,  teach HEP and progress    Consulted and Agree with Plan of Care Patient             Patient will benefit from skilled therapeutic intervention in order to  improve the following deficits and impairments:  Abnormal gait, Decreased balance, Decreased endurance, Decreased mobility, Difficulty walking, Impaired sensation, Decreased activity tolerance, Decreased safety awareness, Decreased strength, Postural dysfunction, Pain  Visit Diagnosis: Muscle weakness (generalized)  Unspecified disturbances of skin sensation  History of falling  Abnormal posture  Difficulty in walking, not elsewhere classified  Balance disorder  Chronic pain of left knee  Abnormality of gait due to impairment of balance  Chronic pain of right knee     Problem List Patient Active Problem List   Diagnosis Date Noted   Neuropathy 10/27/2020   Abnormal gait 06/22/2020   Age-related osteoporosis without current pathological fracture 06/22/2020   Anxiety 06/22/2020   Hardening of the aorta (main artery of the heart) ([place]) 06/22/2020   Malignant lymphoma, non-Hodgkin's (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   Abnormal chest x-ray 12/31/2019   Cough 12/31/2019   Port-A-Cath in place 06/04/2019   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[place]) 04/14/2019   Counseling regarding advance care planning and goals of care 04/14/2019  Lymphadenopathy of head and neck 03/17/2019   Lymph nodes enlarged 03/17/2019   History of pulmonary embolus (PE) 03/17/2019   Acute pulmonary embolism ([place]) 03/06/2019   Diffuse lymphadenopathy 03/06/2019   Pleural effusion 03/06/2019   Pruritus 02/06/2019   Pruritic rash 02/06/2019   Angio-edema 02/06/2019   Shortness of breath 02/06/2019   Osteopenia 12/18/2013   Heart block AV first degree 12/18/2013   Breast cancer of upper-inner quadrant of right female breast ([place]) 03/19/2012   Right knee DJD 08/18/2011    Hypothyroidism    Hypertension    PONV (postoperative nausea and vomiting)    Hyperlipemia    Donato Heinz. Owens Shark PT  Norwood Levo 01/04/2021, 3:55 PM  [place], [place], [address] Phone: [phone]   Fax:  [phone]  Name: Sarah [name] MRN: [id number] Date of Birth: 1932/06/08

## 2021-01-06 ENCOUNTER — Encounter: Payer: Self-pay | Admitting: Physical Therapy

## 2021-01-06 ENCOUNTER — Ambulatory Visit: Payer: Medicare Other | Admitting: Physical Therapy

## 2021-01-06 ENCOUNTER — Other Ambulatory Visit: Payer: Self-pay

## 2021-01-06 DIAGNOSIS — Z9181 History of falling: Secondary | ICD-10-CM | POA: Diagnosis not present

## 2021-01-06 DIAGNOSIS — R293 Abnormal posture: Secondary | ICD-10-CM | POA: Diagnosis not present

## 2021-01-06 DIAGNOSIS — R2689 Other abnormalities of gait and mobility: Secondary | ICD-10-CM | POA: Diagnosis not present

## 2021-01-06 DIAGNOSIS — R262 Difficulty in walking, not elsewhere classified: Secondary | ICD-10-CM | POA: Diagnosis not present

## 2021-01-06 DIAGNOSIS — M25512 Pain in left shoulder: Secondary | ICD-10-CM | POA: Diagnosis not present

## 2021-01-06 DIAGNOSIS — R209 Unspecified disturbances of skin sensation: Secondary | ICD-10-CM

## 2021-01-06 DIAGNOSIS — M6281 Muscle weakness (generalized): Secondary | ICD-10-CM | POA: Diagnosis not present

## 2021-01-06 NOTE — Therapy (Signed)
[place], [place], [address] Phone: [phone]   Fax:  [phone]  Physical Therapy Treatment  Patient Details  Name: Sarah [name] MRN: [id number] Date of Birth: October 31, 1932 Referring Provider (PT): Dr. Irene Limbo   Encounter Date: 01/06/2021   PT End of Session - 01/06/21 1544     Visit Number 12    Number of Visits 25    Date for PT Re-Evaluation 01/14/21    PT Start Time 1500    PT Stop Time 1530   pt had to leave early to get to other dr appt   PT Time Calculation (min) 30 min    Activity Tolerance Patient tolerated treatment well    Behavior During Therapy [facility] for tasks assessed/performed             Past Medical History:  Diagnosis Date   Allergy    codeine, thiazides   Anxiety    new dx   Arthritis    Breast cancer ([place]) 03/14/12   bx=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,ER/PR=+,upper inner quad   Cancer ([place])    breast   Cataract    DVT (deep venous thrombosis) ([place]) 02/2019   left leg   Dyspnea    Edema    both legs feet and toe, abdomen   Glaucoma    laser treated years ago   HOH (hard of hearing)    Hyperlipemia    Hypertension    Hypothyroidism    Pancreatic cyst    benign   PONV (postoperative nausea and vomiting)    Pulmonary embolism ([place]) 02/2019   bilateral    Radiation 06/11/2012-07/12/2012   17 sessions 4250 cGy, 3 sessions 750 cGy   Vertigo    Wears glasses    Wears partial dentures    partial upper    Past Surgical History:  Procedure Laterality Date   ABDOMINAL HYSTERECTOMY  1966   1/2 ovary left in    [place]   lumpectomy-lt   CATARACT EXTRACTION     b/l   COLONOSCOPY     EXCISION MASS NECK Left 03/17/2019    EXCISION MASS NECK (Left Neck)   EXCISION MASS NECK Left 03/17/2019   Procedure: EXCISION MASS NECK;  Surgeon: Helayne Seminole, MD;  Location: [facility];  Service: ENT;  Laterality: Left;   EYE SURGERY  Bilateral    bilateral cataract removal   IR IMAGING GUIDED PORT INSERTION  04/23/2019   JOINT REPLACEMENT  2013   rt total knee   JOINT REPLACEMENT  1995   lt total knee   PARTIAL MASTECTOMY WITH NEEDLE LOCALIZATION  04/09/2012   Procedure: PARTIAL MASTECTOMY WITH NEEDLE LOCALIZATION;  Surgeon: Adin Hector, MD;  Location: [facility];  Service: General;  Laterality: Right;   SKIN BIOPSY Left 03/17/2019   LEFT THIGH   SKIN BIOPSY Left 03/17/2019   Procedure: Skin Biopsy Left Thigh;  Surgeon: Helayne Seminole, MD;  Location: [facility] OR;  Service: ENT;  Laterality: Left;   TONSILLECTOMY     TOTAL KNEE ARTHROPLASTY  08/16/2011   Procedure: TOTAL KNEE ARTHROPLASTY;  Surgeon: Ninetta Lights, MD;  Location: [facility];  Service: Orthopedics;  Laterality: Right;    There were no vitals filed for this visit.   Subjective Assessment - 01/06/21 1503     Subjective Pt brings her walker in , just has a little achiness in her knees. She has to leave early today because  she has a doctor's appt for her shoulder.( even though she says she shoulder is feeling better now )    Patient is accompained by: Family member    Pertinent History pt with Angioimmunoblastic T cell lymphoma  with treatment starting  November 2020 and has had diffiuclty since. She gets chemo once every 3 weeks. Past history includes bilateral TKR., right breast cancer 03/14/2012    Patient Stated Goals to walk without a cane    Currently in Pain? No/denies                               [facility] Adult PT Treatment/Exercise - 01/06/21 0001       Ambulation/Gait   Ambulation/Gait Yes    Ambulation Distance (Feet) 400 Feet    Assistive device Rolling walker    Gait Comments cues to stand up straight and slow down      High Level Balance   High Level Balance Activities Side stepping;Backward walking;Marching forwards;Marching backwards   in // bars   High Level Balance Comments mini squats and heel  raises in // bars      Exercises   Exercises Knee/Hip      Knee/Hip Exercises: Seated   Other Seated Knee/Hip Exercises 2 sets of 10 of leg presses with yellow theraband with each leg                      PT Short Term Goals - 12/28/20 1558       PT SHORT TERM GOAL #1   Title Pt will be independent with HEP within 3 weeks in order to demonstrate autonomy of care.    Time 4    Period Weeks    Status On-going      PT SHORT TERM GOAL #2   Title Pt will 3/5 dorsiflexion of left ankle to decrease risk of fall while walking    Baseline 2-/5 on 10/14/2020    Period Weeks    Status On-going      PT SHORT TERM GOAL #3   Title Pt will demonstrate safe technique in getting up and down 8 steps using handrail as neede    Time 4    Period Weeks    Status On-going               PT Long Term Goals - 12/28/20 1558       PT LONG TERM GOAL #1   Title Pt wil increase sit to stand to 8 in 30 seconds indicating an increase in fuctional strengthe    Baseline 4 in 30 seconds    Period Weeks    Status On-going      PT LONG TERM GOAL #2   Title Pt will decrease TUG time by 3 seconds indicating and decrease in risk for fall    Time 8    Period Weeks    Status On-going      PT LONG TERM GOAL #3   Title Pt will be able to walk 50 feet with cane and holding 5 pounds weight so that she safely do household chores.    Time 8    Period Weeks    Status On-going      PT LONG TERM GOAL #4   Title Pt can walk 50 feet without the use of her cane so that she can safely walk across the rooms in her home  Time 8    Period Weeks    Status On-going                   Plan - 01/06/21 1545     Clinical Impression Statement Pt with much better performance today with RW though she stated she felt tired. She says she has been very active today. She agreed that she walks better with RW, though she really would prefer to walk without device.    Personal Factors and  Comorbidities Comorbidity 3+    Comorbidities lymphoma with chemotherapy., bilateral TKR, osteopenia    Examination-Participation Restrictions Volunteer;Church;Laundry    Stability/Clinical Decision Making Stable/Uncomplicated    Rehab Potential Good    PT Frequency 2x / week    PT Duration 8 weeks    PT Treatment/Interventions ADLs/Self Care Home Management;Moist Heat;Gait training;Stair training;DME Instruction;Functional mobility training;Therapeutic activities;Therapeutic exercise;Balance training;Neuromuscular re-education;Patient/family education;Orthotic Fit/Training    PT Next Visit Plan continue  work on standing balance with knees straight and cont balance activities in // bars and stairs; continue interval training strengthening to ankles, hips and kness with progressive weights,  practice stair training strategies,  teach HEP and progress    Consulted and Agree with Plan of Care Patient    Family Member Consulted Daughter, [name]             Patient will benefit from skilled therapeutic intervention in order to improve the following deficits and impairments:  Abnormal gait, Decreased balance, Decreased endurance, Decreased mobility, Difficulty walking, Impaired sensation, Decreased activity tolerance, Decreased safety awareness, Decreased strength, Postural dysfunction, Pain  Visit Diagnosis: Muscle weakness (generalized)  Unspecified disturbances of skin sensation  History of falling  Abnormal posture  Difficulty in walking, not elsewhere classified  Balance disorder     Problem List Patient Active Problem List   Diagnosis Date Noted   Neuropathy 10/27/2020   Abnormal gait 06/22/2020   Age-related osteoporosis without current pathological fracture 06/22/2020   Anxiety 06/22/2020   Hardening of the aorta (main artery of the heart) ([place]) 06/22/2020   Malignant lymphoma, non-Hodgkin's ([place]) 06/22/2020   Mild cognitive disorder 06/22/2020   Non-pressure chronic  ulcer of unspecified part of unspecified lower leg with unspecified severity ([place]) 06/22/2020   Primary insomnia 06/22/2020   Pulmonary nodule 06/22/2020   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   Abnormal chest x-ray 12/31/2019   Cough 12/31/2019   Port-A-Cath in place 06/04/2019   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[place]) 04/14/2019   Counseling regarding advance care planning and goals of care 04/14/2019   Lymphadenopathy of head and neck 03/17/2019   Lymph nodes enlarged 03/17/2019   History of pulmonary embolus (PE) 03/17/2019   Acute pulmonary embolism ([place]) 03/06/2019   Diffuse lymphadenopathy 03/06/2019   Pleural effusion 03/06/2019   Pruritus 02/06/2019   Pruritic rash 02/06/2019   Angio-edema 02/06/2019   Shortness of breath 02/06/2019   Osteopenia 12/18/2013   Heart block AV first degree 12/18/2013   Breast cancer of upper-inner quadrant of right female breast ([place]) 03/19/2012   Right knee DJD 08/18/2011   Hypothyroidism    Hypertension    PONV (postoperative nausea and vomiting)    Hyperlipemia    Donato Heinz. Owens Shark PT  Norwood Levo 01/06/2021, 3:49 PM  [place], [place], [address] Phone: [phone]   Fax:  [phone]  Name: Sarah [name] MRN: [id number] Date of Birth: 07-05-1932

## 2021-01-07 ENCOUNTER — Encounter: Payer: Medicare Other | Admitting: Physical Therapy

## 2021-01-12 ENCOUNTER — Ambulatory Visit (INDEPENDENT_AMBULATORY_CARE_PROVIDER_SITE_OTHER): Payer: Medicare Other | Admitting: Podiatry

## 2021-01-12 ENCOUNTER — Encounter: Payer: Self-pay | Admitting: Podiatry

## 2021-01-12 ENCOUNTER — Other Ambulatory Visit: Payer: Self-pay

## 2021-01-12 DIAGNOSIS — M205X2 Other deformities of toe(s) (acquired), left foot: Secondary | ICD-10-CM | POA: Diagnosis not present

## 2021-01-12 DIAGNOSIS — B351 Tinea unguium: Secondary | ICD-10-CM | POA: Diagnosis not present

## 2021-01-12 DIAGNOSIS — L84 Corns and callosities: Secondary | ICD-10-CM | POA: Diagnosis not present

## 2021-01-12 DIAGNOSIS — M79674 Pain in right toe(s): Secondary | ICD-10-CM

## 2021-01-12 DIAGNOSIS — G629 Polyneuropathy, unspecified: Secondary | ICD-10-CM

## 2021-01-12 DIAGNOSIS — M79675 Pain in left toe(s): Secondary | ICD-10-CM | POA: Diagnosis not present

## 2021-01-12 NOTE — Patient Instructions (Signed)
You may purchase leather toe crests from [employer]. They are foot specific, so you will have to purchase for left foot. Size medium should suffice.   We discussed flexor tenotomy of the left 3rd toe, so let me know if you would like to see Dr. Lanae Crumbly for this.

## 2021-01-16 NOTE — Progress Notes (Signed)
Subjective: Sarah [name] is a pleasant 87 y.o. female patient seen today for at risk foot care with history of peripheral neuropathy and corn(s) left 3rd toe and painful thick toenails that are difficult to trim. Painful toenails interfere with ambulation. Aggravating factors include wearing enclosed shoe gear. Pain is relieved with periodic professional debridement. Painful corns are aggravated when weightbearing when wearing enclosed shoe gear. Pain is relieved with periodic professional debridement.   She states her left 3rd toe is really tender on today. She has not been able to wear an enclosed hoe and is wearing open toed sandals on today's visit  PCP is Stoneking, Hal, MD.  Allergies  Allergen Reactions   Citalopram Anxiety   Alendronate Sodium Other (See Comments)   Amoxicillin-Pot Clavulanate Other (See Comments)   Codeine Other (See Comments)    Reaction not recalled   Latex    Lisinopril Other (See Comments)   Septra [Sulfamethoxazole-Trimethoprim] Other (See Comments)   Thiazide-Type Diuretics Other (See Comments)    Blurry vision?? (patient stated she has macular degeneration)    Objective: Physical Exam  General: Sarah [name] is a pleasant 87 y.o. Caucasian female, WD, WN in NAD. AAO x 3.   Vascular:  Capillary fill time to digits <3 seconds b/l lower extremities. Palpable DP pulse(s) b/l lower extremities Palpable PT pulse(s) b/l lower extremities Pedal hair absent. Lower extremity skin temperature gradient within normal limits. No pain with calf compression b/l. Patient wearing compression hose on today's visit.  Dermatological:  Skin warm and supple b/l lower extremities. No open wounds b/l lower extremities. No interdigital macerations b/l lower extremities. Toenails 2-5 bilaterally and L hallux elongated, discolored, dystrophic, thickened, and crumbly with subungual debris and tenderness to dorsal palpation. Anonychia noted R hallux. Nailbed(s) epithelialized.   Preulcerative lesion noted L 3rd toe. There is visible subdermal hemorrhage. There is no surrounding erythema, no edema, no drainage, no odor, no fluctuance.  Musculoskeletal:  Normal muscle strength 5/5 to all lower extremity muscle groups bilaterally. Hammertoe(s) noted to the L 2nd toe, L 4th toe, L 5th toe, R 2nd toe, R 3rd toe, R 4th toe, and R 5th toe. Clawtoe deformity L 3rd toe. Left toe is flexible at DIPJ.  Neurological:  Pt has subjective symptoms of neuropathy. Protective sensation intact 5/5 intact bilaterally with 10g monofilament b/l.  Assessment and Plan:  1. Pain due to onychomycosis of toenails of both feet   2. Pre-ulcerative corn or callous   3. Clawtoe, acquired, left   4. Pain in toe of left foot   5. Neuropathy      -Examined patient. -Discussed painful left 3rd toe and treatment options on today's visit. Dispsnsed digital toe cap. Discussed flexor tenotomy in order to get digit in a more rectus positiion. Daughter will think about it anc contact me via MyChart if they would like to proceed . -Patient to continue soft, supportive shoe gear daily. -Toenails 2-5 bilaterally and L hallux debrided in length and girth without iatrogenic bleeding with sterile nail nipper and dremel.  -Preulcerative lesion pared L 3rd toe. Total number pared=1. -Patient to report any pedal injuries to medical professional immediately. -Patient/POA to call should there be question/concern in the interim.  Return in about 9 weeks (around 03/16/2021).  Marzetta Board, DPM

## 2021-01-18 ENCOUNTER — Ambulatory Visit: Payer: Medicare Other | Admitting: Physical Therapy

## 2021-01-18 ENCOUNTER — Other Ambulatory Visit: Payer: Self-pay

## 2021-01-18 DIAGNOSIS — R209 Unspecified disturbances of skin sensation: Secondary | ICD-10-CM

## 2021-01-18 DIAGNOSIS — R293 Abnormal posture: Secondary | ICD-10-CM | POA: Diagnosis not present

## 2021-01-18 DIAGNOSIS — M6281 Muscle weakness (generalized): Secondary | ICD-10-CM

## 2021-01-18 DIAGNOSIS — R262 Difficulty in walking, not elsewhere classified: Secondary | ICD-10-CM | POA: Diagnosis not present

## 2021-01-18 DIAGNOSIS — G8929 Other chronic pain: Secondary | ICD-10-CM

## 2021-01-18 DIAGNOSIS — Z9181 History of falling: Secondary | ICD-10-CM | POA: Diagnosis not present

## 2021-01-18 DIAGNOSIS — R2689 Other abnormalities of gait and mobility: Secondary | ICD-10-CM

## 2021-01-18 NOTE — Therapy (Signed)
[place], [place], [address] Phone: [phone]   Fax:  [phone]  Physical Therapy Treatment  Patient Details  Name: Sarah [name] MRN: [id number] Date of Birth: [birth year] Referring Provider (PT): Dr. Irene Limbo   Encounter Date: 01/18/2021   PT End of Session - 01/18/21 1815     Visit Number 13    Number of Visits 25    Date for PT Re-Evaluation 02/14/21    PT Start Time 1400    PT Stop Time 4627    PT Time Calculation (min) 45 min    Activity Tolerance Patient limited by fatigue    Behavior During Therapy [facility] for tasks assessed/performed             Past Medical History:  Diagnosis Date   Allergy    codeine, thiazides   Anxiety    new dx   Arthritis    Breast cancer ([place]) 03/14/12   bx=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,ER/PR=+,upper inner quad   Cancer ([place])    breast   Cataract    DVT (deep venous thrombosis) ([place]) 02/2019   left leg   Dyspnea    Edema    both legs feet and toe, abdomen   Glaucoma    laser treated years ago   HOH (hard of hearing)    Hyperlipemia    Hypertension    Hypothyroidism    Pancreatic cyst    benign   PONV (postoperative nausea and vomiting)    Pulmonary embolism ([place]) 02/2019   bilateral    Radiation 06/11/2012-07/12/2012   17 sessions 4250 cGy, 3 sessions 750 cGy   Vertigo    Wears glasses    Wears partial dentures    partial upper    Past Surgical History:  Procedure Laterality Date   ABDOMINAL HYSTERECTOMY  1966   1/2 ovary left in    [place]   lumpectomy-lt   CATARACT EXTRACTION     b/l   COLONOSCOPY     EXCISION MASS NECK Left 03/17/2019    EXCISION MASS NECK (Left Neck)   EXCISION MASS NECK Left 03/17/2019   Procedure: EXCISION MASS NECK;  Surgeon: Helayne Seminole, MD;  Location: [facility];  Service: ENT;  Laterality: Left;   EYE SURGERY Bilateral    bilateral cataract removal   IR  IMAGING GUIDED PORT INSERTION  04/23/2019   JOINT REPLACEMENT  2013   rt total knee   JOINT REPLACEMENT  1995   lt total knee   PARTIAL MASTECTOMY WITH NEEDLE LOCALIZATION  04/09/2012   Procedure: PARTIAL MASTECTOMY WITH NEEDLE LOCALIZATION;  Surgeon: Adin Hector, MD;  Location: [facility];  Service: General;  Laterality: Right;   SKIN BIOPSY Left 03/17/2019   LEFT THIGH   SKIN BIOPSY Left 03/17/2019   Procedure: Skin Biopsy Left Thigh;  Surgeon: Helayne Seminole, MD;  Location: [facility] OR;  Service: ENT;  Laterality: Left;   TONSILLECTOMY     TOTAL KNEE ARTHROPLASTY  08/16/2011   Procedure: TOTAL KNEE ARTHROPLASTY;  Surgeon: Ninetta Lights, MD;  Location: [facility];  Service: Orthopedics;  Laterality: Right;    There were no vitals filed for this visit.   Subjective Assessment - 01/18/21 1407     Subjective Pt says that after the last session, she had lots of pain in her knees and all over. She says her knees are a little achy today    Patient is accompained  by: Family member    Pertinent History pt with Angioimmunoblastic T cell lymphoma  with treatment starting  November 2020 and has had diffiuclty since. She gets chemo once every 3 weeks. Past history includes bilateral TKR., right breast cancer 03/14/2012    Patient Stated Goals to walk without a cane    Currently in Pain? Yes    Pain Score 6     Pain Location Knee    Pain Orientation Right;Left    Pain Descriptors / Indicators Aching    Pain Type Chronic pain    Pain Radiating Towards she says her feet still feel like she has glass on the botton    Pain Onset More than a month ago    Pain Frequency Intermittent                [facility] PT Assessment - 01/18/21 0001       Assessment   Medical Diagnosis Angioimmunoblastic T cell lymphoma    Referring Provider (PT) Dr. Irene Limbo    Onset Date/Surgical Date 03/24/19      Prior Function   Level of Independence Independent                            [facility] Adult PT Treatment/Exercise - 01/18/21 0001       Lumbar Exercises: Aerobic   Stationary Bike attempted to do stationary bike and while it looked like pt would be able to push  the pedals around, her feet kept slipping out. she was not able to complete activity      Lumbar Exercises: Supine   Straight Leg Raise 5 reps    Straight Leg Raises Limitations 3rounds of 5 reps with each leg    Other Supine Lumbar Exercises isometric abduction and adduction      Knee/Hip Exercises: Standing   Other Standing Knee Exercises sit to stand from a high mat      Knee/Hip Exercises: Seated   Hamstring Curl Right;Left;5 reps   isometrics     Shoulder Exercises: Supine   Flexion AROM   pushes to ceiling, 3 rounds fo 5 reps, but with some complaint of soreness in left shoulder     Shoulder Exercises: Seated   Other Seated Exercises elbow flexion with 1 # in each hand,                      PT Short Term Goals - 01/18/21 1820       PT SHORT TERM GOAL #1   Title Pt will be independent with HEP within 3 weeks in order to demonstrate autonomy of care.    Time 4    Period Weeks    Status On-going      PT SHORT TERM GOAL #2   Time 4    Period Weeks    Status On-going      PT SHORT TERM GOAL #3   Title Pt will demonstrate safe technique in getting up and down 8 steps using handrail as neede    Time 4    Period Weeks    Status On-going               PT Long Term Goals - 01/18/21 1821       PT LONG TERM GOAL #1   Title Pt wil increase sit to stand to 8 in 30 seconds indicating an increase in fuctional strengthe    Period Weeks  Status On-going      PT LONG TERM GOAL #2   Title Pt will decrease TUG time by 3 seconds indicating and decrease in risk for fall    Baseline 7x with intermittent UE use on the legs and on slightly raised plinth table.    Time 8    Period Weeks    Status On-going      PT LONG TERM GOAL #3   Title Pt  will be able to walk 50 feet with cane and holding 5 pounds weight so that she safely do household chores.    Baseline 7    Period Weeks    Status On-going      PT LONG TERM GOAL #4   Title Pt can walk 50 feet without the use of her cane so that she can safely walk across the rooms in her home    Period Weeks    Status On-going                   Plan - 01/18/21 1816     Clinical Impression Statement Pt appeard fatigued today.  She had complaints of increased leg soreness after last session that focused on walking, so treatment today was done mostly in supine and sitting with some standing activities.  She will likely be able to exercise on a recumbant bike with some modification to make a heel strap for the pedals on the bike in our clinic.  Pt has not yet found out about the exercises at her new living center but reports she is still getting together with "old" friends several times a week. Pt plans to continue exericse so recert was sent.    Personal Factors and Comorbidities Comorbidity 3+    Comorbidities lymphoma with chemotherapy., bilateral TKR, osteopenia    Examination-Activity Limitations Stand;Stairs;Locomotion Level;Lift;Squat;Transfers    Examination-Participation Restrictions Volunteer;Church;Laundry    Stability/Clinical Decision Making Stable/Uncomplicated    Rehab Potential Good    PT Frequency 2x / week    PT Duration 8 weeks    PT Treatment/Interventions ADLs/Self Care Home Management;Moist Heat;Gait training;Stair training;DME Instruction;Functional mobility training;Therapeutic activities;Therapeutic exercise;Balance training;Neuromuscular re-education;Patient/family education;Orthotic Fit/Training    PT Next Visit Plan continue  work on standing balance with knees straight and cont balance activities in // bars and stairs; continue interval training strengthening to ankles, hips and kness with progressive weights,  practice stair training strategies,  teach HEP  and progress    Family Member Consulted Daughter, Phillips             Patient will benefit from skilled therapeutic intervention in order to improve the following deficits and impairments:  Abnormal gait, Decreased balance, Decreased endurance, Decreased mobility, Difficulty walking, Impaired sensation, Decreased activity tolerance, Decreased safety awareness, Decreased strength, Postural dysfunction, Pain  Visit Diagnosis: Unspecified disturbances of skin sensation  Muscle weakness (generalized)  Abnormal posture  Difficulty in walking, not elsewhere classified  Balance disorder  Chronic pain of left knee  Abnormality of gait due to impairment of balance  Chronic pain of right knee     Problem List Patient Active Problem List   Diagnosis Date Noted   Neuropathy 10/27/2020   Abnormal gait 06/22/2020   Age-related osteoporosis without current pathological fracture 06/22/2020   Anxiety 06/22/2020   Hardening of the aorta (main artery of the heart) ([place]) 06/22/2020   Malignant lymphoma, non-Hodgkin's ([place]) 06/22/2020   Mild cognitive disorder 06/22/2020   Non-pressure chronic ulcer of unspecified part of unspecified lower leg with  unspecified severity ([place]) 06/22/2020   Primary insomnia 06/22/2020   Pulmonary nodule 06/22/2020   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   Abnormal chest x-ray 12/31/2019   Cough 12/31/2019   Port-A-Cath in place 06/04/2019   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[place]) 04/14/2019   Counseling regarding advance care planning and goals of care 04/14/2019   Lymphadenopathy of head and neck 03/17/2019   Lymph nodes enlarged 03/17/2019   History of pulmonary embolus (PE) 03/17/2019   Acute pulmonary embolism ([place]) 03/06/2019   Diffuse lymphadenopathy 03/06/2019   Pleural effusion 03/06/2019   Pruritus 02/06/2019   Pruritic rash 02/06/2019   Angio-edema 02/06/2019   Shortness of breath 02/06/2019   Osteopenia 12/18/2013   Heart block AV first degree 12/18/2013    Breast cancer of upper-inner quadrant of right female breast ([place]) 03/19/2012   Right knee DJD 08/18/2011   Hypothyroidism    Hypertension    PONV (postoperative nausea and vomiting)    Hyperlipemia    Donato Heinz. Owens Shark PT  Norwood Levo 01/18/2021, 6:22 PM  [place], [place], [address] Phone: [phone]   Fax:  [phone]  Name: Sarah [name] MRN: [id number] Date of Birth: March 27, 1933

## 2021-01-21 DIAGNOSIS — R5383 Other fatigue: Secondary | ICD-10-CM | POA: Diagnosis not present

## 2021-01-21 DIAGNOSIS — C865 Angioimmunoblastic T-cell lymphoma: Secondary | ICD-10-CM | POA: Diagnosis not present

## 2021-01-21 DIAGNOSIS — I10 Essential (primary) hypertension: Secondary | ICD-10-CM | POA: Diagnosis not present

## 2021-01-21 DIAGNOSIS — E039 Hypothyroidism, unspecified: Secondary | ICD-10-CM | POA: Diagnosis not present

## 2021-01-21 DIAGNOSIS — F411 Generalized anxiety disorder: Secondary | ICD-10-CM | POA: Diagnosis not present

## 2021-01-26 ENCOUNTER — Ambulatory Visit (HOSPITAL_COMMUNITY)
Admission: RE | Admit: 2021-01-26 | Discharge: 2021-01-26 | Disposition: A | Discharge status: Home / Self Care | Payer: Medicare Other | Source: Ambulatory Visit | Attending: Hematology | Admitting: Hematology

## 2021-01-26 ENCOUNTER — Ambulatory Visit: Payer: Medicare Other

## 2021-01-26 ENCOUNTER — Inpatient Hospital Stay (HOSPITAL_BASED_OUTPATIENT_CLINIC_OR_DEPARTMENT_OTHER): Payer: Medicare Other | Admitting: Hematology

## 2021-01-26 ENCOUNTER — Other Ambulatory Visit: Payer: Self-pay

## 2021-01-26 ENCOUNTER — Inpatient Hospital Stay: Payer: Medicare Other | Attending: Hematology

## 2021-01-26 ENCOUNTER — Inpatient Hospital Stay: Payer: Medicare Other

## 2021-01-26 VITALS — BP 125/82 | HR 81 | Temp 98.2°F | Resp 18 | Wt 103.9 lb

## 2021-01-26 DIAGNOSIS — R059 Cough, unspecified: Secondary | ICD-10-CM | POA: Insufficient documentation

## 2021-01-26 DIAGNOSIS — C8440 Peripheral T-cell lymphoma, not classified, unspecified site: Secondary | ICD-10-CM | POA: Diagnosis not present

## 2021-01-26 DIAGNOSIS — C50919 Malignant neoplasm of unspecified site of unspecified female breast: Secondary | ICD-10-CM | POA: Diagnosis not present

## 2021-01-26 DIAGNOSIS — R0602 Shortness of breath: Secondary | ICD-10-CM

## 2021-01-26 DIAGNOSIS — J90 Pleural effusion, not elsewhere classified: Secondary | ICD-10-CM | POA: Diagnosis not present

## 2021-01-26 DIAGNOSIS — I2699 Other pulmonary embolism without acute cor pulmonale: Secondary | ICD-10-CM | POA: Insufficient documentation

## 2021-01-26 DIAGNOSIS — G629 Polyneuropathy, unspecified: Secondary | ICD-10-CM | POA: Diagnosis not present

## 2021-01-26 DIAGNOSIS — C865 Angioimmunoblastic T-cell lymphoma: Secondary | ICD-10-CM | POA: Insufficient documentation

## 2021-01-26 DIAGNOSIS — Z853 Personal history of malignant neoplasm of breast: Secondary | ICD-10-CM | POA: Insufficient documentation

## 2021-01-26 DIAGNOSIS — Z95828 Presence of other vascular implants and grafts: Secondary | ICD-10-CM

## 2021-01-26 LAB — CMP (CANCER CENTER ONLY)
ALT: 10 U/L (ref 0–44)
AST: 12 U/L — ABNORMAL LOW (ref 15–41)
Albumin: 2.7 g/dL — ABNORMAL LOW (ref 3.5–5.0)
Alkaline Phosphatase: 71 U/L (ref 38–126)
Anion gap: 11 (ref 5–15)
BUN: 20 mg/dL (ref 8–23)
CO2: 26 mmol/L (ref 22–32)
Calcium: 9.7 mg/dL (ref 8.9–10.3)
Chloride: 106 mmol/L (ref 98–111)
Creatinine: 0.63 mg/dL (ref 0.44–1.00)
GFR, Estimated: >60 mL/min (ref >60)
Glucose, Bld: 119 mg/dL — ABNORMAL HIGH (ref 70–99)
Potassium: 3.7 mmol/L (ref 3.5–5.1)
Sodium: 143 mmol/L (ref 135–145)
Total Bilirubin: 0.5 mg/dL (ref 0.3–1.2)
Total Protein: 6.5 g/dL (ref 6.5–8.1)

## 2021-01-26 LAB — CBC WITH DIFFERENTIAL (CANCER CENTER ONLY)
Abs Immature Granulocytes: 0.08 10*3/uL — ABNORMAL HIGH (ref 0.00–0.07)
Basophils Absolute: 0 10*3/uL (ref 0.0–0.1)
Basophils Relative: 0 %
Eosinophils Absolute: 0.1 10*3/uL (ref 0.0–0.5)
Eosinophils Relative: 1 %
HCT: 36 % (ref 36.0–46.0)
Hemoglobin: 11.7 g/dL — ABNORMAL LOW (ref 12.0–15.0)
Immature Granulocytes: 1 %
Lymphocytes Relative: 7 %
Lymphs Abs: 0.6 10*3/uL — ABNORMAL LOW (ref 0.7–4.0)
MCH: 29.8 pg (ref 26.0–34.0)
MCHC: 32.5 g/dL (ref 30.0–36.0)
MCV: 91.8 fL (ref 80.0–100.0)
Monocytes Absolute: 0.6 10*3/uL (ref 0.1–1.0)
Monocytes Relative: 7 %
Neutro Abs: 7.4 10*3/uL (ref 1.7–7.7)
Neutrophils Relative %: 84 %
Platelet Count: 331 10*3/uL (ref 150–400)
RBC: 3.92 MIL/uL (ref 3.87–5.11)
RDW: 13.3 % (ref 11.5–15.5)
WBC Count: 8.8 10*3/uL (ref 4.0–10.5)
nRBC: 0 % (ref 0.0–0.2)

## 2021-01-26 LAB — LACTATE DEHYDROGENASE: LDH: 204 U/L — ABNORMAL HIGH (ref 98–192)

## 2021-01-26 MED ORDER — SODIUM CHLORIDE 0.9% FLUSH
10.0000 mL | Freq: Once | INTRAVENOUS | Status: AC
  Administered 2021-01-26: 10 mL

## 2021-01-26 MED ORDER — HEPARIN SOD (PORK) LOCK FLUSH 100 UNIT/ML IV SOLN
500.0000 [IU] | Freq: Once | INTRAVENOUS | Status: AC
  Administered 2021-01-26: 500 [IU]

## 2021-01-27 ENCOUNTER — Emergency Department (HOSPITAL_COMMUNITY): Payer: Medicare Other

## 2021-01-27 ENCOUNTER — Encounter (HOSPITAL_COMMUNITY): Payer: Self-pay

## 2021-01-27 ENCOUNTER — Other Ambulatory Visit: Payer: Self-pay

## 2021-01-27 ENCOUNTER — Emergency Department (HOSPITAL_COMMUNITY)
Admission: EM | Admit: 2021-01-27 | Discharge: 2021-01-27 | Disposition: A | Discharge status: Home / Self Care | Payer: Medicare Other | Attending: Emergency Medicine | Admitting: Emergency Medicine

## 2021-01-27 ENCOUNTER — Telehealth: Payer: Self-pay | Admitting: Hematology

## 2021-01-27 DIAGNOSIS — Z86711 Personal history of pulmonary embolism: Secondary | ICD-10-CM | POA: Insufficient documentation

## 2021-01-27 DIAGNOSIS — Z8572 Personal history of non-Hodgkin lymphomas: Secondary | ICD-10-CM | POA: Insufficient documentation

## 2021-01-27 DIAGNOSIS — Z96653 Presence of artificial knee joint, bilateral: Secondary | ICD-10-CM | POA: Diagnosis not present

## 2021-01-27 DIAGNOSIS — R609 Edema, unspecified: Secondary | ICD-10-CM | POA: Diagnosis not present

## 2021-01-27 DIAGNOSIS — X58XXXA Exposure to other specified factors, initial encounter: Secondary | ICD-10-CM | POA: Insufficient documentation

## 2021-01-27 DIAGNOSIS — M25551 Pain in right hip: Secondary | ICD-10-CM

## 2021-01-27 DIAGNOSIS — I10 Essential (primary) hypertension: Secondary | ICD-10-CM | POA: Diagnosis not present

## 2021-01-27 DIAGNOSIS — Z7901 Long term (current) use of anticoagulants: Secondary | ICD-10-CM | POA: Diagnosis not present

## 2021-01-27 DIAGNOSIS — Y92129 Unspecified place in nursing home as the place of occurrence of the external cause: Secondary | ICD-10-CM | POA: Insufficient documentation

## 2021-01-27 DIAGNOSIS — M25572 Pain in left ankle and joints of left foot: Secondary | ICD-10-CM | POA: Diagnosis not present

## 2021-01-27 DIAGNOSIS — Z853 Personal history of malignant neoplasm of breast: Secondary | ICD-10-CM | POA: Insufficient documentation

## 2021-01-27 DIAGNOSIS — Z79899 Other long term (current) drug therapy: Secondary | ICD-10-CM | POA: Insufficient documentation

## 2021-01-27 DIAGNOSIS — S7001XA Contusion of right hip, initial encounter: Secondary | ICD-10-CM | POA: Insufficient documentation

## 2021-01-27 DIAGNOSIS — S79911A Unspecified injury of right hip, initial encounter: Secondary | ICD-10-CM | POA: Diagnosis present

## 2021-01-27 DIAGNOSIS — E039 Hypothyroidism, unspecified: Secondary | ICD-10-CM | POA: Insufficient documentation

## 2021-01-27 DIAGNOSIS — Z91040 Latex allergy status: Secondary | ICD-10-CM | POA: Diagnosis not present

## 2021-01-27 MED ORDER — ACETAMINOPHEN 325 MG PO TABS
650.0000 mg | ORAL_TABLET | Freq: Once | ORAL | Status: AC
  Administered 2021-01-27: 650 mg via ORAL
  Filled 2021-01-27: qty 2

## 2021-01-27 NOTE — ED Notes (Signed)
Pt discharged from this ED in stable condition at this time. All discharge instructions and follow up care reviewed with pt with no further questions at this time. Pt ambulatory to baseline, clear speech.

## 2021-01-27 NOTE — ED Triage Notes (Signed)
Pt arrives EMS from [facility]" with c/o nontraumatic right hip pain. Denies falling. Reports swelling.

## 2021-01-27 NOTE — ED Notes (Signed)
ED Provider at bedside. 

## 2021-01-27 NOTE — ED Provider Notes (Signed)
[facility] DEPT Provider Note   CSN: [id number] Arrival date & time: 01/27/21  0119     History Chief Complaint  Patient presents with   Hip Pain    Sarah [name] is a 87 y.o. female.  The history is provided by the patient and a relative.  Hip Pain She has history of hypertension, hyperlipidemia, pulmonary embolism with chronic anticoagulation with apixaban and comes in complaining of sudden onset of pain in the right hip and lateral thigh which started tonight.  Pain has subsided somewhat, but she states it was 8/10 at its worst.  She denies any trauma denies any falling.  Nothing makes this better, nothing makes worse.  She has not tried no treatment at home.  She did have an injection in that hip about 5 months ago.   Past Medical History:  Diagnosis Date   Allergy    codeine, thiazides   Anxiety    new dx   Arthritis    Breast cancer ([place]) 03/14/12   bx=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,ER/PR=+,upper inner quad   Cancer ([facility])    breast   Cataract    DVT (deep venous thrombosis) ([place]) 02/2019   left leg   Dyspnea    Edema    both legs feet and toe, abdomen   Glaucoma    laser treated years ago   HOH (hard of hearing)    Hyperlipemia    Hypertension    Hypothyroidism    Pancreatic cyst    benign   PONV (postoperative nausea and vomiting)    Pulmonary embolism ([place]) 02/2019   bilateral    Radiation 06/11/2012-07/12/2012   17 sessions 4250 cGy, 3 sessions 750 cGy   Vertigo    Wears glasses    Wears partial dentures    partial upper    Patient Active Problem List   Diagnosis Date Noted   Neuropathy 10/27/2020   Abnormal gait 06/22/2020   Age-related osteoporosis without current pathological fracture 06/22/2020   Anxiety 06/22/2020   Hardening of the aorta (main artery of the heart) ([place]) 06/22/2020   Malignant lymphoma, non-Hodgkin's ([place]) 06/22/2020   Mild cognitive disorder 06/22/2020    Non-pressure chronic ulcer of unspecified part of unspecified lower leg with unspecified severity ([place]) 06/22/2020   Primary insomnia 06/22/2020   Pulmonary nodule 06/22/2020   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   Abnormal chest x-ray 12/31/2019   Cough 12/31/2019   Port-A-Cath in place 06/04/2019   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[place]) 04/14/2019   Counseling regarding advance care planning and goals of care 04/14/2019   Lymphadenopathy of head and neck 03/17/2019   Lymph nodes enlarged 03/17/2019   History of pulmonary embolus (PE) 03/17/2019   Acute pulmonary embolism ([place]) 03/06/2019   Diffuse lymphadenopathy 03/06/2019   Pleural effusion 03/06/2019   Pruritus 02/06/2019   Pruritic rash 02/06/2019   Angio-edema 02/06/2019   Shortness of breath 02/06/2019   Osteopenia 12/18/2013   Heart block AV first degree 12/18/2013   Breast cancer of upper-inner quadrant of right female breast ([place]) 03/19/2012   Right knee DJD 08/18/2011   Hypothyroidism    Hypertension    PONV (postoperative nausea and vomiting)    Hyperlipemia     Past Surgical History:  Procedure Laterality Date   ABDOMINAL HYSTERECTOMY  1966   1/2 ovary left in    [place]   lumpectomy-lt   CATARACT EXTRACTION     b/l   COLONOSCOPY     EXCISION MASS  NECK Left 03/17/2019    EXCISION MASS NECK (Left Neck)   EXCISION MASS NECK Left 03/17/2019   Procedure: EXCISION MASS NECK;  Surgeon: Helayne Seminole, MD;  Location: [facility];  Service: ENT;  Laterality: Left;   EYE SURGERY Bilateral    bilateral cataract removal   IR IMAGING GUIDED PORT INSERTION  04/23/2019   JOINT REPLACEMENT  2013   rt total knee   JOINT REPLACEMENT  1995   lt total knee   PARTIAL MASTECTOMY WITH NEEDLE LOCALIZATION  04/09/2012   Procedure: PARTIAL MASTECTOMY WITH NEEDLE LOCALIZATION;  Surgeon: Adin Hector, MD;  Location: [facility];  Service: General;  Laterality: Right;   SKIN BIOPSY Left 03/17/2019   LEFT THIGH    SKIN BIOPSY Left 03/17/2019   Procedure: Skin Biopsy Left Thigh;  Surgeon: Helayne Seminole, MD;  Location: [facility];  Service: ENT;  Laterality: Left;   TONSILLECTOMY     TOTAL KNEE ARTHROPLASTY  08/16/2011   Procedure: TOTAL KNEE ARTHROPLASTY;  Surgeon: Ninetta Lights, MD;  Location: [facility];  Service: Orthopedics;  Laterality: Right;     OB History   No obstetric history on file.     Family History  Problem Relation Age of Onset   Heart disease Father    Cancer Maternal Aunt        stomach    Social History   Tobacco Use   Smoking status: Never   Smokeless tobacco: Never  Vaping Use   Vaping Use: Never used  Substance Use Topics   Alcohol use: No   Drug use: No    Home Medications Prior to Admission medications   Medication Sig Start Date End Date Taking? Authorizing Provider  amLODipine (NORVASC) 2.5 MG tablet Take 2.5-5 mg by mouth See admin instructions. 5 mg in the morning, 2.5 mg in the evening Patient not taking: Reported on 01/26/2021 12/21/18   [provider]  amoxicillin (AMOXIL) 500 MG capsule Take 4 capsules (2,000 mg total) by mouth once as needed for up to 1 dose. 60 mins prior to dental procedure Patient not taking: No sig reported 12/02/19   Brunetta Genera, MD  apixaban (ELIQUIS) 2.5 MG TABS tablet Take 1 tablet (2.5 mg total) by mouth 2 (two) times daily. 07/05/20   Brunetta Genera, MD  benzonatate (TESSALON) 100 MG capsule Take 1 capsule (100 mg total) by mouth 3 (three) times daily as needed for cough. Patient not taking: No sig reported 12/29/19   Brunetta Genera, MD  Cholecalciferol (VITAMIN D) 50 MCG (2000 UT) tablet Take 2,000 Units by mouth 2 (two) times daily.    [provider]  clonazepam (KLONOPIN) 0.125 MG disintegrating tablet Take 1 tablet (0.125 mg total) by mouth 2 (two) times daily. Patient not taking: No sig reported 03/23/20   Brunetta Genera, MD  Cyanocobalamin (VITAMIN B-12 PO) Take 3,000 mcg by mouth  daily.  Patient not taking: Reported on 01/26/2021    [provider]  gabapentin (NEURONTIN) 100 MG capsule Take 2 capsules (200 mg total) by mouth at bedtime. Patient not taking: No sig reported 09/29/20   Brunetta Genera, MD  levothyroxine (SYNTHROID) 25 MCG tablet Take 25 mcg by mouth daily before breakfast.  09/20/18   [provider]  lidocaine-prilocaine (EMLA) cream Apply to affected area once 04/13/20   Brunetta Genera, MD  LORazepam (ATIVAN) 0.5 MG tablet Take 1 tablet (0.5 mg total) by mouth 2 (two) times daily  as needed for anxiety or sleep. 12/16/20   Brunetta Genera, MD  Multiple Vitamins-Minerals (PRESERVISION AREDS PO) Take 1 capsule by mouth 2 (two) times daily.     [provider]  ondansetron (ZOFRAN) 8 MG tablet Take 1 tablet (8 mg total) by mouth 2 (two) times daily as needed. Start on the third day after chemotherapy. Patient not taking: No sig reported 04/14/19   Brunetta Genera, MD  polyethylene glycol (MIRALAX / GLYCOLAX) 17 g packet Take 17 g by mouth daily. Patient not taking: Reported on 01/26/2021    [provider]  prochlorperazine (COMPAZINE) 10 MG tablet Take 1 tablet (10 mg total) by mouth every 6 (six) hours as needed (Nausea or vomiting). Patient not taking: Reported on 01/26/2021 11/15/20   Brunetta Genera, MD  sodium chloride (MURO 128) 5 % ophthalmic ointment Place 1 application into both eyes at bedtime.    [provider]  XIIDRA 5 % SOLN Place 1 drop into both eyes daily as needed (dry eyes). 10/22/19   [provider]  zolpidem (AMBIEN) 5 MG tablet Take 5 mg by mouth at bedtime as needed for sleep.  12/07/19   [provider]  citalopram (CELEXA) 10 MG tablet Take 1 tablet (10 mg total) by mouth daily. Patient not taking: Reported on 12/30/2019 12/02/19 12/31/19  Brunetta Genera, MD    Allergies    Citalopram, Alendronate sodium, Amoxicillin-pot clavulanate, Codeine, Latex,  Lisinopril, Septra [sulfamethoxazole-trimethoprim], and Thiazide-type diuretics  Review of Systems   Review of Systems  All other systems reviewed and are negative.  Physical Exam Updated Vital Signs BP 140/86   Pulse 100   Temp 99.3 F (37.4 C) (Oral)   Resp 20   SpO2 97%   Physical Exam Vitals and nursing note reviewed.  87 year old female, resting comfortably and in no acute distress. Vital signs are normal. Oxygen saturation is 97%, which is normal. Head is normocephalic and atraumatic. PERRLA, EOMI. Oropharynx is clear. Neck is nontender and supple without adenopathy or JVD. Back is nontender and there is no CVA tenderness. Lungs are clear without rales, wheezes, or rhonchi. Chest is nontender. Heart has regular rate and rhythm without murmur. Abdomen is soft, flat, nontender. Extremities: There is a swollen, fluctuant area on the lateral aspect of the right hip.  There is no erythema or warmth of this, and it is only mildly tender.  Logroll is negative.  There is pain on internal rotation but not on external rotation or hip flexion.  Leg length is symmetric. Skin is warm and dry without rash. Neurologic: Mental status is normal, cranial nerves are intact, moves all extremities equally.  ED Results / Procedures / Treatments    Radiology DG Chest 2 View  Result Date: 01/26/2021 CLINICAL DATA:  Breast cancer.  History of T-cell lymphoma. EXAM: CHEST - 2 VIEW COMPARISON:  01/06/2020 FINDINGS: Interval progression of patchy airspace disease in the lower lungs bilaterally with small right pleural effusion. The cardio pericardial silhouette is enlarged. Right Port-A-Cath again noted. Bones are diffusely demineralized. IMPRESSION: Interval progression of bibasilar airspace disease with small right pleural effusion. Some of these parenchymal changes may reflect scarring as they were present previously but new disease today could be infectious/inflammatory or related to the history of  neoplasm. CT chest recommended to further evaluate. Electronically Signed   By: Misty Stanley M.D.   On: 01/26/2021 13:22   CT Hip Right Wo Contrast  Result Date: 01/27/2021 CLINICAL DATA:  87 year old female with recently increasing right hip pain and swelling. No known injury. Remote history of treated breast cancer and lymphoma. EXAM: CT OF THE RIGHT HIP WITHOUT CONTRAST TECHNIQUE: Multidetector CT imaging of the right hip was performed according to the standard protocol. Multiplanar CT image reconstructions were also generated. COMPARISON:  Right hip radiographs 0300 hours today. CT Chest, Abdomen, and Pelvis 02/27/2019. FINDINGS: Nondilated visible large and small bowel with retained stool in the colon. Chronic pelvic vascular calcifications. Unremarkable urinary bladder. No pelvic free fluid identified. Some pelvic floor laxity is suspected and chronic. Pelvic osteopenia. The visible right sacrum and SI joint appear intact. Right pubic rami and pubic symphysis appear stable and intact. Asymmetric chronic lucency along the superior acetabulum (series 4, image 20 today) is stable since 2020 and associated with other smaller benign-appearing subchondral lucencies which are likely synovial cysts. The right femoral head appears stable and intact, with occasional small lucencies, the largest on series 4, image 26 is unchanged. Right femoral neck appears intact. The intertrochanteric segment and proximal right femoral shaft also appear stable and intact. No acute osseous abnormality identified. However, there is an oval mixed density intramuscular mass along the lateral proximal right femur with components both superior and inferior to the trochanters. This encompasses 70 x 50 x 113 mm (AP by transverse by CC - estimated volume 198 mL) and most resembles intramuscular hematoma. There is mild overlying subcutaneous inflammatory stranding. But beyond the margins of the mass the other regional soft tissues appear  stable and within normal limits. IMPRESSION: 1. An oval mixed density intramuscular mass along the lateral proximal right femur encompasses 7 x 5 x 11 cm and most resembles an Intramuscular Hematoma. 2. The underlying proximal right femur appears stable and intact. No acute osseous abnormality is identified. Electronically Signed   By: Genevie Ann M.D.   On: 01/27/2021 04:12   DG Hip Unilat W or Wo Pelvis 2-3 Views Right  Result Date: 01/27/2021 CLINICAL DATA:  Right hip pain EXAM: DG HIP (WITH OR WITHOUT PELVIS) 2-3V RIGHT COMPARISON:  None. FINDINGS: Normal alignment. No fracture or dislocation. Mild asymmetric right hip degenerative arthritis. Soft tissues are unremarkable. IMPRESSION: Mild right hip degenerative arthritis. Electronically Signed   By: Fidela Salisbury M.D.   On: 01/27/2021 03:05    Procedures Procedures   Medications Ordered in ED Medications - No data to display  ED Course  I have reviewed the triage vital signs and the nursing notes.  Pertinent imaging results that were available during my care of the patient were reviewed by me and considered in my medical decision making (see chart for details).   MDM Rules/Calculators/A&P                         Right hip pain with focal swelling, probable bursitis.  Old records are reviewed, and she did have an orthopedic office visit in April at which time there is a procedure listed of drain/inject bursa of the right hip, but no office note is available and there was no imaging available.  Will check x-ray, consider follow-up with CT if no abnormality seen on plain x-ray.  Plain x-ray showed no acute injury.  CT scan was obtained showing a hematoma in the soft tissues lateral to the proximal femur.  This is probably a hematoma given her history of anticoagulation.  Apparently, she had fallen about 5 days ago.  Patient is advised of these findings.  She is  advised to apply ice and use acetaminophen as needed for pain.  Follow-up with her  orthopedic surgeon.  Final Clinical Impression(s) / ED Diagnoses Final diagnoses:  Pain in right hip  Hematoma of right hip, initial encounter  Chronic anticoagulation    Rx / DC Orders ED Discharge Orders     None        Delora Fuel, MD 43/56/86 [phone]

## 2021-01-27 NOTE — Telephone Encounter (Signed)
Scheduled follow-up appointment per 9/7 los. Patient's daughter is aware. 

## 2021-01-27 NOTE — Discharge Instructions (Addendum)
Apply ice for 30 minutes at a time, 4 times a day.  Take acetaminophen as needed for pain.  Please note that combining ibuprofen and acetaminophen gives you better pain relief than either medication by itself.  Please follow-up with your orthopedic doctor.

## 2021-01-31 ENCOUNTER — Inpatient Hospital Stay: Payer: Medicare Other | Admitting: Hematology

## 2021-01-31 ENCOUNTER — Inpatient Hospital Stay: Payer: Medicare Other

## 2021-02-01 ENCOUNTER — Encounter: Payer: Self-pay | Admitting: Hematology

## 2021-02-01 NOTE — Progress Notes (Addendum)
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 female who has been referred to [place] by Lajean Manes, MD for evaluation and management of Possible lymphoma . She is accompanied today by [name] . The pt reports that she is doing well overall.   Pending Surgical pathology from 03/17/2019 Dr.Manny will contact clinic about pathology   August 25th/27th she began coughing and sneezing. On Sept 3 went to doctors and they said it was allergies. She began having rashes (little red dots). Went to PCP and they prescribed prednisone and for several days and the medication helped. But as soon as she completed the prescription symptoms reoccurred.   She had a chest X Ray done because she was having a severe cough on 02/17/2019 revealing "bilateral effusions in this patient with history of breast cancer,of uncertain significance with question of right lower lobe pulmonary nodule, consider chest CT for further assessment. Atherosclerotic changes in the thoracic aorta."  Dr. Amy Martinique biopsied the rashes on her back 02/21/2019 and it was determined that it could be because of an allergy to medication. Dr.Stoneking stopped her blood pressure medication from September- October but ther rash still present. The itching started 1 to 2 months ago. The rash would come and go and was mostly on her back. She still has rash on her left thigh.  She is using triaconazole and cetrizine.  She has lumps behind ear and abdomin and she states that they have swollen twice the size within 1 and half months  She has no appetite and has changes in her  taste that began around August. She has changes in bowels. They are more loose and smaller In size. She also has no control over bowels.  She feels fatigued and it has increased since August.   She has a cough that starts in September  that has made her hoarse.   She had a CT scan on 02/27/2019 because she was having severe cough and her stomach was very extended. Revealing "1. Extensive lymphadenopathy throughout the chest, abdomen, and pelvis, with index lymph nodes identified above. Splenomegaly. Findings are most consistent with lymphoma with recurrent, metastatic breast malignancy less favored. 2. There is generally osteopenic appearance of the skeleton. There are numerous subtle hypodense lesions, particularly of the pelvis (e.g. Series 2, image 88) and select vertebral bodies, for example L4 (series 8, image 92). This is suspicious although not definitive for osseous metastatic disease. Characterization for metabolic activity by nuclear scintigraphic bone scan or PET-CT would be helpful to further evaluate. 3. Moderate right, small left pleural effusions with associated atelectasis or consolidation. Subpleural radiation fibrosis of the anterior right lung. 4. There is a new subpleural pulmonary nodule of the anterior right middle lobe measuring 6 mm (series 4, image 105), nonspecific. There is no obvious pleural thickening or nodularity definitive for metastatic disease. 5.  Small volume ascites. 6. There is a fluid attenuation lesion of the pancreatic uncinate measuring 2.4 x 1.3 cm (series 2, image 63), not substantially changed when compared to remote prior MR examination dated 02/23/2012 and likely sequelae of a prior pseudocyst versus incidental pancreatic IPMN. 7.  Cardiomegaly."  Hospitalized from 03/06/2019-03/08/2019. She presenting with acute shortness of breath, leg swelling, left hand swelling and some weight gain despite poor appetite. In ED, CTA chest revealed at least submassive PE,  moderate right pleural effusion, diffuse LAD concerning for lymphoma.  Started on heparin drip.  PCCM consulted for guidance.  She had thoracocentesis with removal of 1200 cc cloudy exudative pleural fluid.  Cardiology consulted for elevated which was thought to be demand ischemia from right heart strain in the setting of PE. Patient was transitioned to subcu Lovenox by pulmonology the next day and remained stable. Unusual appearance of tracheal air column at the thoracic inlet. Correlate with any history of swallowing difficulty or changes in voice with dedicated imaging with chest CT and or CT of the neck as indicated.  She has a nonhealing ulcer on her right tibialis anterior that starting weeping one week ago. Of note since the patient's last visit, pt has had CHEST XRAY - 2 VIEW (Accession [id number]) completed on 02/17/2019 with results revealing "Bilateral effusions in this patient with history of breast cancer,of uncertain significance with question of right lower lobepulmonary nodule, consider chest CT for furtherassessment. Unusual appearance of tracheal air column at the thoracic inlet.Correlate with any history of swallowing difficulty or changes in voice with dedicated imaging with chest CT and or CT of the neck as Indicated. Atherosclerotic changes in the thoracic aorta."  Of note since the patient's last visit, pt has had CT CHEST, ABDOMEN, AND PELVIS WITH CONTRAST (Accession [id number]) completed on 02/27/2019 with results revealing "1. Extensive lymphadenopathy throughout the chest, abdomen, and pelvis, with index lymph nodes identified above. Splenomegaly.Findings are most consistent with lymphoma with recurrent, metastatic breast malignancy less favored. 2. There is generally osteopenic appearance of the skeleton. There are numerous subtle hypodense lesions, particularly of the pelvis (e.g. Series 2, image 88) and select vertebral bodies, for example L4 (series 8, image 92). This is  suspicious although not definitive for osseous metastatic disease. Characterization for metabolic activity by nuclear scintigraphic bone scan or PET-CT would be helpful to further evaluate.   3. Moderate right, small left pleural effusions with associated atelectasis or consolidation. Subpleural radiation fibrosis of the anterior right lung.4. There is a new subpleural pulmonary nodule of the anterior right middle lobe measuring 6 mm (series 4, image 105), nonspecific. There is no obvious pleural thickening or nodularity definitive for metastatic disease. 5.  Small volume ascites. 6. There is a fluid attenuation lesion of the pancreatic uncinate measuring 2.4 x 1.3 cm (series 2, image 63), not substantially changed when compared to remote prior MR examination dated 02/23/2012 and likely sequelae of a prior pseudocyst versus incidental pancreatic IPMN.7.  Cardiomegaly. 8. Other chronic and incidental findings as detailed above. Aortic Atherosclerosis (ICD10-I70.0)."  Of note since the patient's last visit, pt has had PORTABLE CHEST 1 VIEW (Accession [id number]) completed on 03/06/2019 with results revealing "1. Moderate size right and small left pleural effusions appear not significantly changed since 02/27/19. 2. No new cardiopulmonary abnormality."  Of note since the patient's last visit, pt has had ECHOCARDIOGRAM  completed on 03/06/2019 with results revealing  "1. Left ventricular ejection fraction, by visual estimation, is 60 to 65%. The left ventricle has normal function. Normal left ventricular size. There is no left ventricular hypertrophy. 2. Left ventricular diastolic Doppler parameters are consistent with impaired relaxation pattern of LV diastolic filling. 3. Global right ventricle has mildly reduced systolic function.The right ventricular size is moderately enlarged. No increase in right ventricular wall  thickness. 4. Left atrial size was normal. 5. Right atrial size was mildly dilated. 6.  Trivial pericardial effusion is present. 7. The mitral valve is normal in structure. Trace mitral valve regurgitation. No evidence of mitral stenosis. 8. The tricuspid valve is normal in structure. Tricuspid valve regurgitation severe. 9. The aortic valve is normal in structure. Aortic valve regurgitation was not visualized by color flow Doppler. Structurally normal aortic valve, with no evidence of sclerosis or stenosis.10. The pulmonic valve was normal in structure. Pulmonic valve regurgitation is mild by color flow Doppler.11. Mildly elevated pulmonary artery systolic pressure.12. The inferior vena cava is normal in size with greater than 50% respiratory variability, suggesting right atrial pressure of 3 mmHg."  Of note since the patient's last visit, pt has had CT ANGIOGRAPHY CHEST WITH CONTRAST (Accession [id number]) completed on 03/06/2019 with results revealing "1. Pulmonary embolus arising from the distal left main pulmonary artery with extension into multiple left lower lobe pulmonary arterial branches. There is also incompletely obstructing pulmonary embolus in the proximal left upper lobe pulmonary artery. More distal pulmonary embolus in the right lower lobe pulmonary artery. Positive for acute PE with CT evidence of right heart strain (RV/LV Ratio = 1.6) consistent with at least submassive (intermediate risk) PE. The presence of right heart strain has been associated with an increased risk of morbidity and mortality. Please activate Code PE by paging [phone]. 2. Sizable pleural effusion on the right with smaller pleural effusion on the left. Consolidation and compressive atelectasis throughout most of the right lower lobe. Milder atelectasis left base. Nodular opacities on the right, likely metastatic foci.3.  Stable adenopathy compared to 1 week prior.4. Enlargement of spleen, incompletely visualized but documented CT 1 week prior. Concern for potential lymphoma.  5. Aortic atherosclerosis. No  aneurysm or dissection evident. Foci of coronary artery calcification noted."  Most recent lab results (03/06/2019) of CBC is as follows: all values are WNL except for Platelets at 72, Eosinophils Absolute at 0.6,Abs Immature Granulocytes at 0.08 .  On review of systems, pt reports rashes, digestive issues and denies back pain, abdominal pain and any other symptoms.   On PMHx the pt reports Hypothyroidism, MCI, Hypercholesterolemia, Tracheal anomaly, Hypertension, Pulmonary nodule, Atherosclerosis of aorta, anxiety, gait disorder, primary insomnia.   INTERVAL HISTORY:  ROCHELLE LARUE is a 87 y.o. female here for evaluation and management of Angioimmunoblastic T-cell lymphoma. The patient's last visit with [place] was on 12/06/2020. We are joined today by her daughter.  The pt reports that her neuropathy is about the same.  Notes that she has moved into a skilled nursing facility.  Notes that she has not felt like socializing with the other residents there and has been feeling depressed and anxious. No new rashes no fevers no chills no night sweats. Has been losing some weight due to poor p.o. intake.  No nausea or obvious GI limitation to p.o. intake.  "I just do not like the food at the nursing facility".  Patient does note having a persistent cough.  Lab results today 01/26/2021 CBC unremarkable other than some mild anemia with hemoglobin of 11.7.  No leukocytosis no thrombocytosis. CMP unremarkable other than decrease in albumin to 2.7 likely on account of poor p.o. intake. LDH is lower at 204 down from 257.  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,ER/PR=+,upper inner quad   Cancer ([facility])    breast   Cataract    DVT (deep venous thrombosis) ([facility]) 02/2019   left leg   Dyspnea    Edema    both legs feet and toe,  abdomen   Glaucoma    laser treated years ago   HOH (hard of hearing)    Hyperlipemia    Hypertension    Hypothyroidism    Pancreatic cyst    benign   PONV (postoperative nausea and vomiting)    Pulmonary embolism ([facility]) 02/2019   bilateral    Radiation 06/11/2012-07/12/2012   17 sessions 4250 cGy, 3 sessions 750 cGy   Vertigo    Wears glasses    Wears partial dentures    partial upper     SURGICAL HISTORY: Past Surgical History:  Procedure Laterality Date   ABDOMINAL HYSTERECTOMY  1966   1/2 ovary left in    [place]   lumpectomy-lt   CATARACT EXTRACTION     b/l   COLONOSCOPY     EXCISION MASS NECK Left 03/17/2019    EXCISION MASS NECK (Left Neck)   EXCISION MASS NECK Left 03/17/2019   Procedure: EXCISION MASS NECK;  Surgeon: Helayne Seminole, MD;  Location: [facility];  Service: ENT;  Laterality: Left;   EYE SURGERY Bilateral    bilateral cataract removal   IR IMAGING GUIDED PORT INSERTION  04/23/2019   JOINT REPLACEMENT  2013   rt total knee   JOINT REPLACEMENT  1995   lt total knee   PARTIAL MASTECTOMY WITH NEEDLE LOCALIZATION  04/09/2012   Procedure: PARTIAL MASTECTOMY WITH NEEDLE LOCALIZATION;  Surgeon: Adin Hector, MD;  Location: [facility];  Service: General;  Laterality: Right;   SKIN BIOPSY Left 03/17/2019   LEFT THIGH   SKIN BIOPSY Left 03/17/2019   Procedure: Skin Biopsy Left Thigh;  Surgeon: Helayne Seminole, MD;  Location: [facility];  Service: ENT;  Laterality: Left;   TONSILLECTOMY     TOTAL KNEE ARTHROPLASTY  08/16/2011   Procedure: TOTAL KNEE ARTHROPLASTY;  Surgeon: Ninetta Lights, MD;  Location: [facility];  Service: Orthopedics;  Laterality: Right;     SOCIAL HISTORY: Social History   Socioeconomic History   Marital status: Widowed    Spouse name: Not on file   Number of children: 2   Years of education: Not on file   Highest education level: Not on file  Occupational History   Occupation: Retired    Comment:  Community education officer   Tobacco Use   Smoking status: Never   Smokeless tobacco: Never  Vaping Use   Vaping Use: Never used  Substance and Sexual Activity   Alcohol use: No   Drug use: No   Sexual activity: Not Currently  Other Topics Concern   Not on file  Social History Narrative   Not on file   Social Determinants of Health   Financial Resource Strain: Not on file  Food Insecurity: Not on file  Transportation Needs: Not on file  Physical Activity: Not on file  Stress: Not on file  Social Connections: Not on file  Intimate Partner Violence: Not on file     FAMILY HISTORY: Family History  Problem Relation Age of Onset   Heart disease Father    Cancer Maternal Aunt        stomach     ALLERGIES:   is allergic to citalopram, alendronate sodium, amoxicillin-pot  clavulanate, codeine, latex, lisinopril, septra [sulfamethoxazole-trimethoprim], and thiazide-type diuretics.   MEDICATIONS:  Current Outpatient Medications  Medication Sig Dispense Refill   apixaban (ELIQUIS) 2.5 MG TABS tablet Take 1 tablet (2.5 mg total) by mouth 2 (two) times daily. 60 tablet 5   Cholecalciferol (VITAMIN D) 50 MCG (2000 UT) tablet Take 2,000 Units by mouth 2 (two) times daily.     levothyroxine (SYNTHROID) 25 MCG tablet Take 25 mcg by mouth daily before breakfast.      lidocaine-prilocaine (EMLA) cream Apply to affected area once 30 g 3   LORazepam (ATIVAN) 0.5 MG tablet Take 1 tablet (0.5 mg total) by mouth 2 (two) times daily as needed for anxiety or sleep. 30 tablet 0   Multiple Vitamins-Minerals (PRESERVISION AREDS PO) Take 1 capsule by mouth 2 (two) times daily.      sodium chloride (MURO 128) 5 % ophthalmic ointment Place 1 application into both eyes at bedtime.     XIIDRA 5 % SOLN Place 1 drop into both eyes daily as needed (dry eyes).     zolpidem (AMBIEN) 5 MG tablet Take 5 mg by mouth at bedtime as needed for sleep.      amLODipine (NORVASC) 2.5 MG tablet Take 2.5-5 mg by mouth See  admin instructions. 5 mg in the morning, 2.5 mg in the evening (Patient not taking: Reported on 01/26/2021)     clonazepam (KLONOPIN) 0.125 MG disintegrating tablet Take 1 tablet (0.125 mg total) by mouth 2 (two) times daily. (Patient not taking: No sig reported) 60 tablet 0   Cyanocobalamin (VITAMIN B-12 PO) Take 3,000 mcg by mouth daily.  (Patient not taking: Reported on 01/26/2021)     gabapentin (NEURONTIN) 100 MG capsule Take 2 capsules (200 mg total) by mouth at bedtime. (Patient not taking: No sig reported) 60 capsule 2   ondansetron (ZOFRAN) 8 MG tablet Take 1 tablet (8 mg total) by mouth 2 (two) times daily as needed. Start on the third day after chemotherapy. (Patient not taking: No sig reported) 30 tablet 1   polyethylene glycol (MIRALAX / GLYCOLAX) 17 g packet Take 17 g by mouth daily. (Patient not taking: Reported on 01/26/2021)     No current facility-administered medications for this visit.     REVIEW OF SYSTEMS:   .10 Point review of Systems was done is negative except as noted above.   PHYSICAL EXAMINATION: ECOG FS:1 - Symptomatic but completely ambulatory  Vitals:   01/26/21 1043  BP: 125/82  Pulse: 81  Resp: 18  Temp: 98.2 F (36.8 C)  SpO2: 97%    Wt Readings from Last 3 Encounters:  01/26/21 103 lb 14.4 oz (47.1 kg)  12/06/20 106 lb 12.8 oz (48.4 kg)  11/15/20 108 lb 14.4 oz (49.4 kg)   Body mass index is 16.77 kg/m.    Exam was given in a chair.   GENERAL:alert, in no acute distress and comfortable SKIN: no acute rashes, no significant lesions EYES: conjunctiva are pink and non-injected, sclera anicteric OROPHARYNX: MMM, no exudates, no oropharyngeal erythema or ulceration NECK: supple, no JVD LYMPH:  no palpable lymphadenopathy in the cervical, axillary or inguinal regions LUNGS: clear to auscultation b/l with normal respiratory effort HEART: regular rate & rhythm ABDOMEN:  normoactive bowel sounds , non tender, not distended. Extremity: b/l 1+ pedal  edema PSYCH: alert & oriented x 3 with fluent speech NEURO: no focal motor/sensory deficits   LABORATORY DATA:  I have reviewed the data as listed  CBC Latest Ref Rng &  Units 01/26/2021 12/06/2020 11/15/2020  WBC 4.0 - 10.5 K/uL 8.8 6.2 4.2  Hemoglobin 12.0 - 15.0 g/dL 11.7(L) 13.8 14.1  Hematocrit 36.0 - 46.0 % 36.0 42.1 41.8  Platelets 150 - 400 K/uL 331 192 162    CMP Latest Ref Rng & Units 01/26/2021 12/06/2020 11/15/2020  Glucose 70 - 99 mg/dL 119(H) 161(H) 83  BUN 8 - 23 mg/dL 20 23 25(H)  Creatinine 0.44 - 1.00 mg/dL 0.63 0.87 0.65  Sodium 135 - 145 mmol/L 143 143 143  Potassium 3.5 - 5.1 mmol/L 3.7 3.8 4.1  Chloride 98 - 111 mmol/L 106 106 107  CO2 22 - 32 mmol/L 26 29 27   Calcium 8.9 - 10.3 mg/dL 9.7 9.6 10.5(H)  Total Protein 6.5 - 8.1 g/dL 6.5 6.8 7.1  Total Bilirubin 0.3 - 1.2 mg/dL 0.5 0.7 0.6  Alkaline Phos 38 - 126 U/L 71 78 71  AST 15 - 41 U/L 12(L) 13(L) 23  ALT 0 - 44 U/L 10 11 14    . Lab Results  Component Value Date   LDH 204 (H) 01/26/2021    Component     Latest Ref Rng & Units 03/27/2019  Hepatitis B Surface Ag     NON REACTIVE NON REACTIVE  Hep B Core Total Ab     NON REACTIVE NON REACTIVE  HCV Ab     NON REACTIVE NON REACTIVE  LDH     98 - 192 U/L 252 (H)   . Lab Results  Component Value Date   LDH 204 (H) 01/26/2021      04/08/2019 NM PET Image Initial (PI) Skull Base To Thigh (Accession [id number])  04/04/2019 PATHOLOGY   04/07/2019 ECHOCARDIOGRAM  03/28/2019 PATHOLOGY   03/06/2019 CT ANGIOGRAPHY CHEST WITH CONTRAST (Accession [id number])  04/07/2019 ECHOCARDIOGRAM  03/06/2019 ECHOCARDIOGRAM     03/06/2019 PORTABLE CHEST 1 VIEW (Accession [id number])    02/27/2019 CT CHEST, ABDOMEN, AND PELVIS WITH CONTRAST (Accession [id number])    02/17/2019 CHEST XRAY - 2 VIEW (Accession [id number])    RADIOGRAPHIC STUDIES: I have personally reviewed the radiological images as listed and agreed with the findings in the report. DG  Chest 2 View  Result Date: 01/26/2021 CLINICAL DATA:  Breast cancer.  History of T-cell lymphoma. EXAM: CHEST - 2 VIEW COMPARISON:  01/06/2020 FINDINGS: Interval progression of patchy airspace disease in the lower lungs bilaterally with small right pleural effusion. The cardio pericardial silhouette is enlarged. Right Port-A-Cath again noted. Bones are diffusely demineralized. IMPRESSION: Interval progression of bibasilar airspace disease with small right pleural effusion. Some of these parenchymal changes may reflect scarring as they were present previously but new disease today could be infectious/inflammatory or related to the history of neoplasm. CT chest recommended to further evaluate. Electronically Signed   By: Misty Stanley M.D.   On: 01/26/2021 13:22   CT Hip Right Wo Contrast  Result Date: 01/27/2021 CLINICAL DATA:  87 year old female with recently increasing right hip pain and swelling. No known injury. Remote history of treated breast cancer and lymphoma. EXAM: CT OF THE RIGHT HIP WITHOUT CONTRAST TECHNIQUE: Multidetector CT imaging of the right hip was performed according to the standard protocol. Multiplanar CT image reconstructions were also generated. COMPARISON:  Right hip radiographs 0300 hours today. CT Chest, Abdomen, and Pelvis 02/27/2019. FINDINGS: Nondilated visible large and small bowel with retained stool in the colon. Chronic pelvic vascular calcifications. Unremarkable urinary bladder. No pelvic free fluid identified. Some pelvic floor laxity is suspected and chronic. Pelvic osteopenia. The visible right  sacrum and SI joint appear intact. Right pubic rami and pubic symphysis appear stable and intact. Asymmetric chronic lucency along the superior acetabulum (series 4, image 20 today) is stable since 2020 and associated with other smaller benign-appearing subchondral lucencies which are likely synovial cysts. The right femoral head appears stable and intact, with occasional small  lucencies, the largest on series 4, image 26 is unchanged. Right femoral neck appears intact. The intertrochanteric segment and proximal right femoral shaft also appear stable and intact. No acute osseous abnormality identified. However, there is an oval mixed density intramuscular mass along the lateral proximal right femur with components both superior and inferior to the trochanters. This encompasses 70 x 50 x 113 mm (AP by transverse by CC - estimated volume 198 mL) and most resembles intramuscular hematoma. There is mild overlying subcutaneous inflammatory stranding. But beyond the margins of the mass the other regional soft tissues appear stable and within normal limits. IMPRESSION: 1. An oval mixed density intramuscular mass along the lateral proximal right femur encompasses 7 x 5 x 11 cm and most resembles an Intramuscular Hematoma. 2. The underlying proximal right femur appears stable and intact. No acute osseous abnormality is identified. Electronically Signed   By: Genevie Ann M.D.   On: 01/27/2021 04:12   DG Hip Unilat W or Wo Pelvis 2-3 Views Right  Result Date: 01/27/2021 CLINICAL DATA:  Right hip pain EXAM: DG HIP (WITH OR WITHOUT PELVIS) 2-3V RIGHT COMPARISON:  None. FINDINGS: Normal alignment. No fracture or dislocation. Mild asymmetric right hip degenerative arthritis. Soft tissues are unremarkable. IMPRESSION: Mild right hip degenerative arthritis. Electronically Signed   By: Fidela Salisbury M.D.   On: 01/27/2021 03:05     ASSESSMENT & PLAN:   FRIEDA ARNALL is a 87 y.o. female with:  1. 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 SKIN, LEFT THIGH, BIOPSY: - Involvement by angioimmunoblastic T-cell lymphoma." -04/07/2019 Echocardiogram - normal EF -04/08/2019 NM PET Image Initial (PI) Skull Base To Thigh (Accession [id number])  revealed "1. Widespread hypermetabolic adenopathy in the neck, chest, and abdomen/pelvis, primarily in the Deauville 4 and Deauville 5 range. There is also hypermetabolic activity along the posterior nasopharynx in lingual tonsillar regions potentially indicating sites of involvement. 2. The subtle hypodensities in the spine and bony pelvis are not appreciably hypermetabolic may be incidental, surveillance is suggested. 3. Bilateral thyroid activity but especially on the left. Probably from thyroiditis. 4. A 6 mm subpleural nodule anteriorly in the right lower lobe not appreciably hypermetabolic but is below sensitive PET-CT size thresholds and merits surveillance. 5. Mildly accentuated diffuse splenic activity without splenomegaly or focal splenic lesion identified. 6. Other imaging findings of potential clinical significance: Aortic Atherosclerosis (ICD10-I70.0). Coronary atherosclerosis. Large right and small left pleural effusions. Moderate cardiomegaly. Small pericardial effusion. Mild mesenteric edema. Large left renal cyst. Fullness of both renal collecting systems with suspected nonobstructive left nephrolithiasis. Degenerative grade 1 anterolisthesis at L4-5 at L5-S1. Trace pelvic ascites." -05/28/2019 PET/CT ([id number]) revealed "Complete metabolic response to therapy". -09/16/2019 PET/CT ([id number]) revealed "1. Unfortunately evidence of lymphoma recurrence at sites of previous hypermetabolic adenopathy. Hypermetabolic lymphoid tissue is new from comparison PET-CT 05/28/2019 but at same locations as PET-CT 04/08/2019. The number of metastatic lymph nodes is less than 04/08/2019. Lymphoid tissue at the RIGHT skull base is larger. 2. Three foci of hypermetabolic recurrence are present, lymphoid tissue at the RIGHT base of skull, hypermetabolic  RIGHT hilar lymph node and hypermetabolic lymph node in the porta hepatis." -11/25/2019 PET/CT ([id number]) revealed "1. Marked interval improvement in the  hypermetabolic disease identified previously in the posterior right nasopharynx, compatible with Deauville 4 today. 2. Decreased hypermetabolism in the right hilar and porta hepatis lymph nodes identified previously (Deauville 3). No new sites of hypermetabolic disease in the chest, abdomen, or pelvis. 3. Similar diffuse thyroid uptake, left greater than right. 4. No substantial change in right-sided pulmonary nodules." -04/09/2020 PET/CT ([id number]) revealed "No evidence progressive lymphoma."  2. Pulmonary Embolism -extensive likely related to extensive malignancy  3. H/o Breast Cancer  -The patient had bilateral diagnostic  mammography at [facility] 07/11/2011. This  showed some calcifications in the right  breast, which seemed a little bit more  prominent than prior. In the left breast  there was an area of possible  architectural distortion. However left  breast ultrasound the same day showed  no abnormality. 6 month followup was  suggested, and on 02/28/2012 the  patient again had bilateral diagnostic  mammography, now with right  ultrasonography. The microcalcifications  in the right breast appeared increased.  Ultrasound showed a hypoechoic lesion  measuring 5 mm, with no associated  shadowing. This had been previously  noted and appeared unchanged. Anterior  to that there was a small hypoechoic  mass measuring 4 mm in diameter.  There was felt to be suspicious, and on  03/14/2012 the patient underwent biopsy  of the right breast mass, showing ([id number]  [id number]) ductal carcinoma in situ,  intermediate grade, estrogen receptor  100% and progesterone receptor 100%  positive.    -Bilateral breast MRI was obtained  03/26/2012. This showed only post  biopsy changes in the right breast,  associated with an area of non-masslike  enhancement measuring 2.4 cm. The  left breast was unremarkable, and there  was no enlarged axillary or internal  mammary adenopathy noted.  Accordingly on 04/09/2012 the patient   underwent right lumpectomy, the  pathology ([id number]) showing ductal  carcinoma in situ measuring 2.0 cm,  grade 2, with negative margins, the  closest being 0.3 cm. The patient's  subsequent history is as detailed below.   PLAN: -Discussed pt labwork today, 09/72022; blood counts and chemistries and LDH stable. -Emphasized importance of increasing caloric intake and focusing on calorically dense foods. The pt has no restrictions in regards to diet and should focus on gaining weight and increasing daily calories. --Continue to f/u w PT and working on proper exercise and diet. -Advised pt not to drive at this time due to neuropathy in feet. -fall precautions at [facility] -Patient has no lab or clinical evidence of lymphoma progression/recurrence at this time. -Continue 2.5 Eliquis BID  -Recommended pt take Vitamin B Complex daily.  -CXR today for evaluation of SOB/cough  FOLLOW-UP: CXR stat today Referral to behavioral health for depression and anxiety/geriatric psychology PET CT scan in 7 weeks Return to clinic with Dr. Irene Limbo with port flush and labs in 2 months.  . The total time spent in the appointment was 41 minutes and more than 50% was on counseling and direct patient cares.   All of the patient's questions were answered with apparent satisfaction. The patient knows to call the clinic with any problems, questions or concerns.    Sullivan Lone MD [place] AAHIVMS [facility] Hematology/Oncology Physician [facility]  (Office):       [phone] (Work cell):  [phone] (Fax):  [phone]    ADDENDUM I called and discussed chest x-ray results with the patient and her daughter CXR- Interval progression of bibasilar airspace disease with small right pleural effusion. Some of these parenchymal changes may reflect scarring as they were present previously but new disease today could be infectious/inflammatory or related to the history of neoplasm. CT chest  recommended to further evaluate.   Discussed that this could all be progression of scarring but cannot rule out underlying additional possible lymphoma or pneumonia. Patient has had new cough and slimy whitish secretions for about a month or more. She notes no fevers no chills no night sweats. No new shortness of breath. Did have a fall and was in the emergency room for right hip pain and found to have a hematoma and has seen orthopedics to continue to manage this. Plan -Decided with the patient and her daughter to treat for pneumonia.  Sent prescription for Levaquin to her pharmacy -No overt aspiration concerns. -We will get CT of the chest to evaluate further in the next 10 to 14 days. -Phone visit with Dr. Irene Limbo in 3 weeks -Patient aware to go to emergency room if there is worsening shortness of breath fevers or any signs of worsening clinical status.  Brunetta Genera MD

## 2021-02-02 DIAGNOSIS — F321 Major depressive disorder, single episode, moderate: Secondary | ICD-10-CM | POA: Diagnosis not present

## 2021-02-02 DIAGNOSIS — E441 Mild protein-calorie malnutrition: Secondary | ICD-10-CM | POA: Diagnosis not present

## 2021-02-02 DIAGNOSIS — Z23 Encounter for immunization: Secondary | ICD-10-CM | POA: Diagnosis not present

## 2021-02-02 DIAGNOSIS — I10 Essential (primary) hypertension: Secondary | ICD-10-CM | POA: Diagnosis not present

## 2021-02-03 ENCOUNTER — Telehealth: Payer: Self-pay | Admitting: Hematology

## 2021-02-03 MED ORDER — LEVOFLOXACIN 500 MG PO TABS: 500.0000 mg | ORAL_TABLET | Freq: Every day | ORAL | 0 refills | Status: DC

## 2021-02-03 NOTE — Addendum Note (Signed)
Addended by: Sullivan Lone on: 02/03/2021 02:00 PM   Modules accepted: Orders

## 2021-02-03 NOTE — Telephone Encounter (Signed)
Scheduled per sch msg. Called and left msg  

## 2021-02-08 DIAGNOSIS — M25551 Pain in right hip: Secondary | ICD-10-CM | POA: Diagnosis not present

## 2021-02-09 ENCOUNTER — Other Ambulatory Visit: Payer: Self-pay

## 2021-02-09 ENCOUNTER — Ambulatory Visit (INDEPENDENT_AMBULATORY_CARE_PROVIDER_SITE_OTHER): Payer: Medicare Other | Admitting: Podiatry

## 2021-02-09 DIAGNOSIS — L84 Corns and callosities: Secondary | ICD-10-CM

## 2021-02-09 DIAGNOSIS — M2041 Other hammer toe(s) (acquired), right foot: Secondary | ICD-10-CM | POA: Diagnosis not present

## 2021-02-09 DIAGNOSIS — M2042 Other hammer toe(s) (acquired), left foot: Secondary | ICD-10-CM

## 2021-02-09 DIAGNOSIS — D689 Coagulation defect, unspecified: Secondary | ICD-10-CM | POA: Diagnosis not present

## 2021-02-10 NOTE — Progress Notes (Signed)
Subjective:   Patient ID: [name], female   DOB: 87 y.o.   MRN: [id number]   HPI Patient states she is very concerned about her hammertoes of her second and third toes and also she has developed a painful lesion on the third toe left that she cannot walk on.  She presents with caregiver   ROS      Objective:  Physical Exam  Neurovascular status was found to be adequate for this patient's age with patient found to have significant digital deformity digits 2 and 3 left foot with distal keratotic lesion digit 3 left painful when pressed making walking difficult with several devices to try to lift the toe up its not been effective for this patient     Assessment:  Significant hammertoe deformity digits 2 3 left with distal keratotic lesion digit 3 painful     Plan:  H&P reviewed condition and discussed.  I debrided the lesion third toe and I applied a digital sleeve to try to take pressure off it and discussed the possibility for digital surgery which may be necessary in this case.  Patient wants to consider this and we will discuss depending on response to this conservative treatment  X-rays indicate that there is moderate elevation of these lesser digits which is creating pressure

## 2021-02-22 ENCOUNTER — Ambulatory Visit (HOSPITAL_COMMUNITY)
Admission: RE | Admit: 2021-02-22 | Discharge: 2021-02-22 | Disposition: A | Discharge status: Home / Self Care | Payer: Medicare Other | Source: Ambulatory Visit | Attending: Hematology | Admitting: Hematology

## 2021-02-22 ENCOUNTER — Ambulatory Visit (HOSPITAL_COMMUNITY): Admission: RE | Admit: 2021-02-22 | Payer: Medicare Other | Source: Ambulatory Visit

## 2021-02-22 ENCOUNTER — Other Ambulatory Visit: Payer: Self-pay

## 2021-02-22 DIAGNOSIS — J439 Emphysema, unspecified: Secondary | ICD-10-CM | POA: Diagnosis not present

## 2021-02-22 DIAGNOSIS — R059 Cough, unspecified: Secondary | ICD-10-CM | POA: Diagnosis not present

## 2021-02-22 DIAGNOSIS — R0602 Shortness of breath: Secondary | ICD-10-CM | POA: Insufficient documentation

## 2021-02-22 DIAGNOSIS — R911 Solitary pulmonary nodule: Secondary | ICD-10-CM | POA: Diagnosis not present

## 2021-02-22 DIAGNOSIS — C8590 Non-Hodgkin lymphoma, unspecified, unspecified site: Secondary | ICD-10-CM | POA: Diagnosis not present

## 2021-02-22 DIAGNOSIS — I700 Atherosclerosis of aorta: Secondary | ICD-10-CM | POA: Diagnosis not present

## 2021-02-23 ENCOUNTER — Ambulatory Visit (HOSPITAL_COMMUNITY): Payer: Medicare Other

## 2021-02-25 ENCOUNTER — Inpatient Hospital Stay: Payer: Medicare Other | Attending: Hematology | Admitting: Hematology

## 2021-02-25 DIAGNOSIS — C8440 Peripheral T-cell lymphoma, not classified, unspecified site: Secondary | ICD-10-CM

## 2021-02-25 DIAGNOSIS — R051 Acute cough: Secondary | ICD-10-CM | POA: Diagnosis not present

## 2021-02-25 DIAGNOSIS — C865 Angioimmunoblastic T-cell lymphoma: Secondary | ICD-10-CM | POA: Insufficient documentation

## 2021-02-25 MED ORDER — ESCITALOPRAM OXALATE 10 MG PO TABS: 10.0000 mg | ORAL_TABLET | Freq: Every day | ORAL | Status: DC

## 2021-02-28 ENCOUNTER — Telehealth: Payer: Self-pay | Admitting: Hematology

## 2021-02-28 NOTE — Telephone Encounter (Signed)
Scheduled follow-up appointment per 10/7 los. Patient's daughter is aware.

## 2021-03-01 DIAGNOSIS — L821 Other seborrheic keratosis: Secondary | ICD-10-CM | POA: Diagnosis not present

## 2021-03-01 DIAGNOSIS — D2262 Melanocytic nevi of left upper limb, including shoulder: Secondary | ICD-10-CM | POA: Diagnosis not present

## 2021-03-01 DIAGNOSIS — D225 Melanocytic nevi of trunk: Secondary | ICD-10-CM | POA: Diagnosis not present

## 2021-03-01 DIAGNOSIS — L817 Pigmented purpuric dermatosis: Secondary | ICD-10-CM | POA: Diagnosis not present

## 2021-03-03 ENCOUNTER — Encounter: Payer: Self-pay | Admitting: Hematology

## 2021-03-03 NOTE — Progress Notes (Signed)
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 female who has been referred to [place] by Sarah Manes, MD for evaluation and management of Possible lymphoma . She is accompanied today by her daughter Sarah [name] . The pt reports that she is doing well overall.   Pending Surgical pathology from 03/17/2019 Sarah [name] will contact clinic about pathology   August 25th/27th she began coughing and sneezing. On Sept 3 went to doctors and they said it was allergies. She began having rashes (little red dots). Went to PCP and they prescribed prednisone and for several days and the medication helped. But as soon as she completed the prescription symptoms reoccurred.   She had a chest X Ray done because she was having a severe cough on 02/17/2019 revealing "bilateral effusions in this patient with history of breast cancer,of uncertain significance with question of right lower lobe pulmonary nodule, consider chest CT for further assessment. Atherosclerotic changes in the thoracic aorta."  Sarah [name] biopsied the rashes on her back 02/21/2019 and it was determined that it could be because of an allergy to medication. Sarah [name] stopped her blood pressure medication from September- October but ther rash still present. The itching started 1 to 2 months ago. The rash would come and go and was mostly on her back. She still has rash on her left thigh.  She is using triaconazole and cetrizine.  She has lumps behind ear and abdomin and she states that they have swollen twice the size within 1 and half months  She has no appetite and has changes in  her taste that began around August. She has changes in bowels. They are more loose and smaller In size. She also has no control over bowels.  She feels fatigued and it has increased since August.   She has a cough that starts in September  that has made her hoarse.   She had a CT scan on 02/27/2019 because she was having severe cough and her stomach was very extended. Revealing "1. Extensive lymphadenopathy throughout the chest, abdomen, and pelvis, with index lymph nodes identified above. Splenomegaly. Findings are most consistent with lymphoma with recurrent, metastatic breast malignancy less favored. 2. There is generally osteopenic appearance of the skeleton. There are numerous subtle hypodense lesions, particularly of the pelvis (e.g. Series 2, image 88) and select vertebral bodies, for example L4 (series 8, image 92). This is suspicious although not definitive for osseous metastatic disease. Characterization for metabolic activity by nuclear scintigraphic bone scan or PET-CT would be helpful to further evaluate. 3. Moderate right, small left pleural effusions with associated atelectasis or consolidation. Subpleural radiation fibrosis of the anterior right lung. 4. There is a new subpleural pulmonary nodule of the anterior right middle lobe measuring 6 mm (series 4, image 105), nonspecific. There is no obvious pleural thickening or nodularity definitive for metastatic disease. 5.  Small volume ascites. 6. There is a fluid attenuation lesion of the pancreatic uncinate measuring 2.4 x 1.3 cm (series 2, image 63), not substantially changed when compared to remote prior MR examination dated 02/23/2012 and likely sequelae of a prior pseudocyst versus incidental pancreatic IPMN. 7.  Cardiomegaly."  Hospitalized from 03/06/2019-03/08/2019. She presenting with acute shortness of breath, leg swelling, left hand swelling and some weight gain despite poor appetite. In ED, CTA chest revealed at least submassive PE,  moderate right pleural effusion, diffuse LAD concerning for lymphoma.  Started on heparin drip.  PCCM consulted for guidance.  She had thoracocentesis with removal of 1200 cc cloudy exudative pleural fluid.  Cardiology consulted for elevated which was thought to be demand ischemia from right heart strain in the setting of PE. Patient was transitioned to subcu Lovenox by pulmonology the next day and remained stable. Unusual appearance of tracheal air column at the thoracic inlet. Correlate with any history of swallowing difficulty or changes in voice with dedicated imaging with chest CT and or CT of the neck as indicated.  She has a nonhealing ulcer on her right tibialis anterior that starting weeping one week ago. Of note since the patient's last visit, pt has had CHEST XRAY - 2 VIEW (Accession [id number]) completed on 02/17/2019 with results revealing "Bilateral effusions in this patient with history of breast cancer,of uncertain significance with question of right lower lobepulmonary nodule, consider chest CT for furtherassessment. Unusual appearance of tracheal air column at the thoracic inlet.Correlate with any history of swallowing difficulty or changes in voice with dedicated imaging with chest CT and or CT of the neck as Indicated. Atherosclerotic changes in the thoracic aorta."  Of note since the patient's last visit, pt has had CT CHEST, ABDOMEN, AND PELVIS WITH CONTRAST (Accession [id number]) completed on 02/27/2019 with results revealing "1. Extensive lymphadenopathy throughout the chest, abdomen, and pelvis, with index lymph nodes identified above. Splenomegaly.Findings are most consistent with lymphoma with recurrent, metastatic breast malignancy less favored. 2. There is generally osteopenic appearance of the skeleton. There are numerous subtle hypodense lesions, particularly of the pelvis (e.g. Series 2, image 88) and select vertebral bodies, for example L4 (series 8, image 92). This is  suspicious although not definitive for osseous metastatic disease. Characterization for metabolic activity by nuclear scintigraphic bone scan or PET-CT would be helpful to further evaluate.   3. Moderate right, small left pleural effusions with associated atelectasis or consolidation. Subpleural radiation fibrosis of the anterior right lung.4. There is a new subpleural pulmonary nodule of the anterior right middle lobe measuring 6 mm (series 4, image 105), nonspecific. There is no obvious pleural thickening or nodularity definitive for metastatic disease. 5.  Small volume ascites. 6. There is a fluid attenuation lesion of the pancreatic uncinate measuring 2.4 x 1.3 cm (series 2, image 63), not substantially changed when compared to remote prior MR examination dated 02/23/2012 and likely sequelae of a prior pseudocyst versus incidental pancreatic IPMN.7.  Cardiomegaly. 8. Other chronic and incidental findings as detailed above. Aortic Atherosclerosis (ICD10-I70.0)."  Of note since the patient's last visit, pt has had PORTABLE CHEST 1 VIEW (Accession [id number]) completed on 03/06/2019 with results revealing "1. Moderate size right and small left pleural effusions appear not significantly changed since 02/27/19. 2. No new cardiopulmonary abnormality."  Of note since the patient's last visit, pt has had ECHOCARDIOGRAM  completed on 03/06/2019 with results revealing  "1. Left ventricular ejection fraction, by visual estimation, is 60 to 65%. The left ventricle has normal function. Normal left ventricular size. There is no left ventricular hypertrophy. 2. Left ventricular diastolic Doppler parameters are consistent with impaired relaxation pattern of LV diastolic filling. 3. Global right ventricle has mildly reduced systolic function.The right ventricular size is moderately enlarged. No increase in right  ventricular wall thickness. 4. Left atrial size was normal. 5. Right atrial size was mildly dilated. 6.  Trivial pericardial effusion is present. 7. The mitral valve is normal in structure. Trace mitral valve regurgitation. No evidence of mitral stenosis. 8. The tricuspid valve is normal in structure. Tricuspid valve regurgitation severe. 9. The aortic valve is normal in structure. Aortic valve regurgitation was not visualized by color flow Doppler. Structurally normal aortic valve, with no evidence of sclerosis or stenosis.10. The pulmonic valve was normal in structure. Pulmonic valve regurgitation is mild by color flow Doppler.11. Mildly elevated pulmonary artery systolic pressure.12. The inferior vena cava is normal in size with greater than 50% respiratory variability, suggesting right atrial pressure of 3 mmHg."  Of note since the patient's last visit, pt has had CT ANGIOGRAPHY CHEST WITH CONTRAST (Accession [id number]) completed on 03/06/2019 with results revealing "1. Pulmonary embolus arising from the distal left main pulmonary artery with extension into multiple left lower lobe pulmonary arterial branches. There is also incompletely obstructing pulmonary embolus in the proximal left upper lobe pulmonary artery. More distal pulmonary embolus in the right lower lobe pulmonary artery. Positive for acute PE with CT evidence of right heart strain (RV/LV Ratio = 1.6) consistent with at least submassive (intermediate risk) PE. The presence of right heart strain has been associated with an increased risk of morbidity and mortality. Please activate Code PE by paging [phone]. 2. Sizable pleural effusion on the right with smaller pleural effusion on the left. Consolidation and compressive atelectasis throughout most of the right lower lobe. Milder atelectasis left base. Nodular opacities on the right, likely metastatic foci.3.  Stable adenopathy compared to 1 week prior.4. Enlargement of spleen, incompletely visualized but documented CT 1 week prior. Concern for potential lymphoma.  5. Aortic atherosclerosis. No  aneurysm or dissection evident. Foci of coronary artery calcification noted."  Most recent lab results (03/06/2019) of CBC is as follows: all values are WNL except for Platelets at 72, Eosinophils Absolute at 0.6,Abs Immature Granulocytes at 0.08 .  On review of systems, pt reports rashes, digestive issues and denies back pain, abdominal pain and any other symptoms.   On PMHx the pt reports Hypothyroidism, MCI, Hypercholesterolemia, Tracheal anomaly, Hypertension, Pulmonary nodule, Atherosclerosis of aorta, anxiety, gait disorder, primary insomnia.   INTERVAL HISTORY:  .I connected with Sarah [name] on .02/25/2021 at 10:20 AM EDT by telephone visit and verified that I am speaking with the correct person using two identifiers.   I discussed the limitations, risks, security and privacy concerns of performing an evaluation and management service by telemedicine and the availability of in-person appointments. I also discussed with the patient that there may be a patient responsible charge related to this service. The patient expressed understanding and agreed to proceed.   Other persons participating in the visit and their role in the encounter: Patient's daughter  Patient was called to follow-up on her angioimmunoblastic T-cell lymphoma and to discuss the results of her CT scan. Patient in the doctor's note right hip pain has improved and so she has decided not to pursue with the steroid injection for possible bursitis. She notes that since taking oral antibiotics [levofloxacin] her cough and shortness of breath have resolved.  She has no fevers chills night sweats or expectoration.  CT of the chest from 02/22/2021 was discussed and showed-Multifocal areas of nodularity bandlike changes and potential bronchiectatic changes particularly in the RIGHT middle lobe also with lingular findings and tree-in-bud opacities most suggestive of atypical infectious  process such as MAI. Pulmonary consultation  may be helpful. Would consider short interval follow-up in 8-12 weeks given the discrete nodules that are demonstrated in the RIGHT lower lobe. If these persist could consider PET for further evaluation.  4.1 cm ascending thoracic aorta is similar to the prior study. Attention on follow-up. Calcified coronary artery disease as before.  Small pericardial effusion is may be slightly increased.   We discussed the findings and I suggest the possibility of a pulmonary consultation to evaluate her lung findings.  The patient and her daughter want to hold off on this since her symptoms have completely resolved with antibiotics at this time. Patient would like to reevaluate these findings on her PET CT scan and will call us back if she has any new symptoms and if she changes her mind.  Patient had been given a referral for psychological counseling.  Daughter notes that the clinic told her that they do not counsel people the patient's age.  Patient notes no acute new symptoms.  No fevers no chills no night sweats no new lumps or bumps.  Appetite a little better but still not up to the mark.  MEDICAL HISTORY:  Past Medical History:  Diagnosis Date   Allergy    codeine, thiazides   Anxiety    new dx   Arthritis    Breast cancer ([place]) 03/14/12   bx=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,ER/PR=+,upper inner quad   Cancer ([facility])    breast   Cataract    DVT (deep venous thrombosis) ([place]) 02/2019   left leg   Dyspnea    Edema    both legs feet and toe, abdomen   Glaucoma    laser treated years ago   HOH (hard of hearing)    Hyperlipemia    Hypertension    Hypothyroidism    Pancreatic cyst    benign   PONV (postoperative nausea and vomiting)    Pulmonary embolism ([place]) 02/2019   bilateral    Radiation 06/11/2012-07/12/2012   17 sessions 4250 cGy, 3 sessions 750 cGy   Vertigo    Wears glasses    Wears partial dentures    partial upper     SURGICAL HISTORY: Past  Surgical History:  Procedure Laterality Date   ABDOMINAL HYSTERECTOMY  1966   1/2 ovary left in    [place]   lumpectomy-lt   CATARACT EXTRACTION     b/l   COLONOSCOPY     EXCISION MASS NECK Left 03/17/2019    EXCISION MASS NECK (Left Neck)   EXCISION MASS NECK Left 03/17/2019   Procedure: EXCISION MASS NECK;  Surgeon: Helayne Seminole, MD;  Location: [facility];  Service: ENT;  Laterality: Left;   EYE SURGERY Bilateral    bilateral cataract removal   IR IMAGING GUIDED PORT INSERTION  04/23/2019   JOINT REPLACEMENT  2013   rt total knee   JOINT REPLACEMENT  1995   lt total knee   PARTIAL MASTECTOMY WITH NEEDLE LOCALIZATION  04/09/2012   Procedure: PARTIAL MASTECTOMY WITH NEEDLE LOCALIZATION;  Surgeon: Adin Hector, MD;  Location: [facility];  Service: General;  Laterality: Right;   SKIN BIOPSY Left 03/17/2019   LEFT THIGH   SKIN BIOPSY Left 03/17/2019   Procedure: Skin Biopsy Left Thigh;  Surgeon: Helayne Seminole, MD;  Location: [facility];  Service: ENT;  Laterality: Left;   TONSILLECTOMY     TOTAL KNEE ARTHROPLASTY  08/16/2011   Procedure: TOTAL  KNEE ARTHROPLASTY;  Surgeon: Ninetta Lights, MD;  Location: [facility];  Service: Orthopedics;  Laterality: Right;     SOCIAL HISTORY: Social History   Socioeconomic History   Marital status: Widowed    Spouse name: Not on file   Number of children: 2   Years of education: Not on file   Highest education level: Not on file  Occupational History   Occupation: Retired    Comment: Community education officer   Tobacco Use   Smoking status: Never   Smokeless tobacco: Never  Vaping Use   Vaping Use: Never used  Substance and Sexual Activity   Alcohol use: No   Drug use: No   Sexual activity: Not Currently  Other Topics Concern   Not on file  Social History Narrative   Not on file   Social Determinants of Health   Financial Resource Strain: Not on file  Food Insecurity: Not on file  Transportation Needs:  Not on file  Physical Activity: Not on file  Stress: Not on file  Social Connections: Not on file  Intimate Partner Violence: Not on file     FAMILY HISTORY: Family History  Problem Relation Age of Onset   Heart disease Father    Cancer Maternal Aunt        stomach     ALLERGIES:   is allergic to citalopram, alendronate sodium, amoxicillin-pot clavulanate, codeine, latex, lisinopril, septra [sulfamethoxazole-trimethoprim], and thiazide-type diuretics.   MEDICATIONS:  Current Outpatient Medications  Medication Sig Dispense Refill   escitalopram (LEXAPRO) 10 MG tablet Take 1 tablet (10 mg total) by mouth daily.     amLODipine (NORVASC) 2.5 MG tablet Take 2.5-5 mg by mouth See admin instructions. 5 mg in the morning, 2.5 mg in the evening (Patient not taking: Reported on 01/26/2021)     apixaban (ELIQUIS) 2.5 MG TABS tablet Take 1 tablet (2.5 mg total) by mouth 2 (two) times daily. 60 tablet 5   Cholecalciferol (VITAMIN D) 50 MCG (2000 UT) tablet Take 2,000 Units by mouth 2 (two) times daily.     clonazepam (KLONOPIN) 0.125 MG disintegrating tablet Take 1 tablet (0.125 mg total) by mouth 2 (two) times daily. (Patient not taking: No sig reported) 60 tablet 0   Cyanocobalamin (VITAMIN B-12 PO) Take 3,000 mcg by mouth daily.  (Patient not taking: Reported on 01/26/2021)     gabapentin (NEURONTIN) 100 MG capsule Take 2 capsules (200 mg total) by mouth at bedtime. (Patient not taking: No sig reported) 60 capsule 2   levofloxacin (LEVAQUIN) 500 MG tablet Take 1 tablet (500 mg total) by mouth daily. Consider taking over-the-counter probiotics to reduce the risk of diarrhea from antibiotics. 5 tablet 0   levothyroxine (SYNTHROID) 25 MCG tablet Take 25 mcg by mouth daily before breakfast.      lidocaine-prilocaine (EMLA) cream Apply to affected area once 30 g 3   LORazepam (ATIVAN) 0.5 MG tablet Take 1 tablet (0.5 mg total) by mouth 2 (two) times daily as needed for anxiety or sleep. 30 tablet 0    Multiple Vitamins-Minerals (PRESERVISION AREDS PO) Take 1 capsule by mouth 2 (two) times daily.      ondansetron (ZOFRAN) 8 MG tablet Take 1 tablet (8 mg total) by mouth 2 (two) times daily as needed. Start on the third day after chemotherapy. (Patient not taking: No sig reported) 30 tablet 1   polyethylene glycol (MIRALAX / GLYCOLAX) 17 g packet Take 17 g by mouth daily. (Patient not taking: Reported on 01/26/2021)  sodium chloride (MURO 128) 5 % ophthalmic ointment Place 1 application into both eyes at bedtime.     XIIDRA 5 % SOLN Place 1 drop into both eyes daily as needed (dry eyes).     zolpidem (AMBIEN) 5 MG tablet Take 5 mg by mouth at bedtime as needed for sleep.      No current facility-administered medications for this visit.     REVIEW OF SYSTEMS:   .10 Point review of Systems was done is negative except as noted above.   PHYSICAL EXAMINATION: Telemedicine visit  LABORATORY DATA:  I have reviewed the data as listed  CBC Latest Ref Rng & Units 01/26/2021 12/06/2020 11/15/2020  WBC 4.0 - 10.5 K/uL 8.8 6.2 4.2  Hemoglobin 12.0 - 15.0 g/dL 11.7(L) 13.8 14.1  Hematocrit 36.0 - 46.0 % 36.0 42.1 41.8  Platelets 150 - 400 K/uL 331 192 162    CMP Latest Ref Rng & Units 01/26/2021 12/06/2020 11/15/2020  Glucose 70 - 99 mg/dL 119(H) 161(H) 83  BUN 8 - 23 mg/dL 20 23 25(H)  Creatinine 0.44 - 1.00 mg/dL 0.63 0.87 0.65  Sodium 135 - 145 mmol/L 143 143 143  Potassium 3.5 - 5.1 mmol/L 3.7 3.8 4.1  Chloride 98 - 111 mmol/L 106 106 107  CO2 22 - 32 mmol/L 26 29 27   Calcium 8.9 - 10.3 mg/dL 9.7 9.6 10.5(H)  Total Protein 6.5 - 8.1 g/dL 6.5 6.8 7.1  Total Bilirubin 0.3 - 1.2 mg/dL 0.5 0.7 0.6  Alkaline Phos 38 - 126 U/L 71 78 71  AST 15 - 41 U/L 12(L) 13(L) 23  ALT 0 - 44 U/L 10 11 14    . Lab Results  Component Value Date   LDH 204 (H) 01/26/2021    Component     Latest Ref Rng & Units 03/27/2019  Hepatitis B Surface Ag     NON REACTIVE NON REACTIVE  Hep B Core Total Ab     NON  REACTIVE NON REACTIVE  HCV Ab     NON REACTIVE NON REACTIVE  LDH     98 - 192 U/L 252 (H)   . Lab Results  Component Value Date   LDH 204 (H) 01/26/2021      04/08/2019 NM PET Image Initial (PI) Skull Base To Thigh (Accession [id number])  04/04/2019 PATHOLOGY   04/07/2019 ECHOCARDIOGRAM  03/28/2019 PATHOLOGY   03/06/2019 CT ANGIOGRAPHY CHEST WITH CONTRAST (Accession [id number])  04/07/2019 ECHOCARDIOGRAM  03/06/2019 ECHOCARDIOGRAM     03/06/2019 PORTABLE CHEST 1 VIEW (Accession [id number])    02/27/2019 CT CHEST, ABDOMEN, AND PELVIS WITH CONTRAST (Accession [id number])    02/17/2019 CHEST XRAY - 2 VIEW (Accession [id number])    RADIOGRAPHIC STUDIES: I have personally reviewed the radiological images as listed and agreed with the findings in the report. CT Chest Wo Contrast  Result Date: 02/23/2021 CLINICAL DATA:  87 year old female presents with new cough and pulmonary infiltrates on recent chest imaging in the setting of breast cancer and T-cell lymphoma. EXAM: CT CHEST WITHOUT CONTRAST TECHNIQUE: Multidetector CT imaging of the chest was performed following the standard protocol without IV contrast. COMPARISON:  Prior PET exam from August 23, 2020. FINDINGS: Cardiovascular: Aortic atherosclerosis. 4.1 cm ascending thoracic aorta is similar to the prior study. Small pericardial effusion is may be slightly increased. Calcified coronary artery disease as before. Central pulmonary vessels with normal caliber. Limited assessment of cardiovascular structures given lack of intravenous contrast. Mediastinum/Nodes: No thoracic inlet lymphadenopathy. RIGHT-sided Port-A-Cath terminates at caval to atrial junction.  No axillary lymphadenopathy. Lungs/Pleura: Subpleural scarring along the anterior RIGHT chest is stable compared to previous imaging. New area a partially cavitary nodularity in the RIGHT middle lobe measuring 1.5 x 1.9 cm this shows more bandlike features on the coronal  images and there is a similar but more bandlike appearing process extending along the minor fissure in the inferior RIGHT upper lobe. Tree-in-bud nodularity in the RIGHT lung base has developed since the previous PET exam. Similar nodularity tracks throughout the RIGHT upper lobe and there is material within RIGHT upper lobe bronchi. Discrete nodule in the RIGHT upper lobe associated with these findings measures approximately 8 x 6 mm. Bandlike ground-glass attenuation in the RIGHT lower lobe gives way to areas of nodularity that have developed in the RIGHT lower lobe since the prior exam with increased conspicuity of a existing nodule present in the RIGHT lower lobe previously. (Image 93/5) 6 mm RIGHT lower lobe pulmonary nodule shows increased conspicuity though similar size potentially due to changes in slice thickness. 7 mm RIGHT lower lobe pulmonary nodule is new (image 92/5) Smaller areas of tree-in-bud are seen in the lung bases with bandlike changes in the subpleural RIGHT lower lobe. Similar bandlike changes with nodularity seen in the LEFT upper lobe nonfocal and contiguous with lingular airspace disease. Tree-in-bud opacities in the LEFT lung base as well are new compared to previous imaging. The large airways are patent. Upper Abdomen: No acute upper abdominal process. Large cyst arises from the upper pole the LEFT kidney not imaged in its entirety on the current study. No upper abdominal lymphadenopathy or acute process on limited assessment. Musculoskeletal: No acute bone finding. No destructive bone process. Spinal degenerative changes. Osteopenia. IMPRESSION: Multifocal areas of nodularity bandlike changes and potential bronchiectatic changes particularly in the RIGHT middle lobe also with lingular findings and tree-in-bud opacities most suggestive of atypical infectious process such as MAI. Pulmonary consultation may be helpful. Would consider short interval follow-up in 8-12 weeks given the  discrete nodules that are demonstrated in the RIGHT lower lobe. If these persist could consider PET for further evaluation. 4.1 cm ascending thoracic aorta is similar to the prior study. Attention on follow-up. Calcified coronary artery disease as before. Small pericardial effusion is may be slightly increased. Large cyst arises from the upper pole the LEFT kidney not imaged in its entirety on the current study. Aortic Atherosclerosis (ICD10-I70.0) and Emphysema (ICD10-J43.9). Electronically Signed   By: Zetta Bills M.D.   On: 02/23/2021 16:59     ASSESSMENT & PLAN:   Sarah [name] is a 87 y.o. female with:  1. 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 SKIN, LEFT THIGH, BIOPSY: - Involvement by angioimmunoblastic T-cell lymphoma." -04/07/2019 Echocardiogram - normal EF -04/08/2019 NM PET Image Initial (PI) Skull Base To Thigh (Accession [id number]) revealed "1. Widespread hypermetabolic adenopathy in the neck, chest, and abdomen/pelvis, primarily in the Deauville 4 and Deauville 5 range. There is also hypermetabolic activity along the posterior nasopharynx in lingual tonsillar regions potentially indicating sites of involvement. 2. The subtle hypodensities in the spine and bony pelvis are not appreciably hypermetabolic may be incidental, surveillance is suggested. 3. Bilateral thyroid activity but especially on the left. Probably from thyroiditis. 4. A 6 mm subpleural nodule anteriorly in the right lower lobe not appreciably hypermetabolic but is below sensitive PET-CT size thresholds and merits surveillance. 5. Mildly accentuated diffuse splenic activity without splenomegaly or  focal splenic lesion identified. 6. Other imaging findings of potential clinical significance: Aortic Atherosclerosis (ICD10-I70.0). Coronary atherosclerosis. Large  right and small left pleural effusions. Moderate cardiomegaly. Small pericardial effusion. Mild mesenteric edema. Large left renal cyst. Fullness of both renal collecting systems with suspected nonobstructive left nephrolithiasis. Degenerative grade 1 anterolisthesis at L4-5 at L5-S1. Trace pelvic ascites." -05/28/2019 PET/CT ([id number]) revealed "Complete metabolic response to therapy". -09/16/2019 PET/CT ([id number]) revealed "1. Unfortunately evidence of lymphoma recurrence at sites of previous hypermetabolic adenopathy. Hypermetabolic lymphoid tissue is new from comparison PET-CT 05/28/2019 but at same locations as PET-CT 04/08/2019. The number of metastatic lymph nodes is less than 04/08/2019. Lymphoid tissue at the RIGHT skull base is larger. 2. Three foci of hypermetabolic recurrence are present, lymphoid tissue at the RIGHT base of skull, hypermetabolic RIGHT hilar lymph node and hypermetabolic lymph node in the porta hepatis." -11/25/2019 PET/CT ([id number]) revealed "1. Marked interval improvement in the hypermetabolic disease identified previously in the posterior right nasopharynx, compatible with Deauville 4 today. 2. Decreased hypermetabolism in the right hilar and porta hepatis lymph nodes identified previously (Deauville 3). No new sites of hypermetabolic disease in the chest, abdomen, or pelvis. 3. Similar diffuse thyroid uptake, left greater than right. 4. No substantial change in right-sided pulmonary nodules." -04/09/2020 PET/CT ([id number]) revealed "No evidence progressive lymphoma."  2. Pulmonary Embolism -extensive likely related to extensive malignancy  3. H/o Breast Cancer  -The patient had bilateral diagnostic  mammography at [facility] 07/11/2011. This  showed some calcifications in the right  breast, which seemed a little bit more  prominent than prior. In the left breast  there was an area of possible  architectural distortion. However left  breast ultrasound the same day showed   no abnormality. 6 month followup was  suggested, and on 02/28/2012 the  patient again had bilateral diagnostic  mammography, now with right  ultrasonography. The microcalcifications  in the right breast appeared increased.  Ultrasound showed a hypoechoic lesion  measuring 5 mm, with no associated  shadowing. This had been previously  noted and appeared unchanged. Anterior  to that there was a small hypoechoic  mass measuring 4 mm in diameter.  There was felt to be suspicious, and on  03/14/2012 the patient underwent biopsy  of the right breast mass, showing ([id number]  [id number]) ductal carcinoma in situ,  intermediate grade, estrogen receptor  100% and progesterone receptor 100%  positive.    -Bilateral breast MRI was obtained  03/26/2012. This showed only post  biopsy changes in the right breast,  associated with an area of non-masslike  enhancement measuring 2.4 cm. The  left breast was unremarkable, and there  was no enlarged axillary or internal  mammary adenopathy noted.  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 The patient's  subsequent history is as detailed below.  #4 bilateral multifocal lung nodularity with bronchiectatic changes possible atypical infection. Patient had symptoms of cough which have completely resolved after her antibiotics [levofloxacin]  PLAN: -CT chest showing bilateral bronchiectasis and atypical infiltrates were reviewed with the patient and her daughter. -Patient notes her symptoms of cough and shortness of breath have completely resolved after antibiotics and she feels much better and would like to hold off on additional work-up and pulmonary consultation, as was suggestive. -Currently with some improvement in appetite but still not eating up to the  mark.  Has been using some supplements. -Was given a referral to  psychology for anxiety but her daughter notes that the psychology clinic apparently told her that they do not see patients that are as old. -No other acute new symptoms. -Patient will get PET/CT to follow-up on her lymphoma and we should reevaluate her lung findings at the time. -Patient was counseled to inform us or her primary care physician immediately if she had new or worsening pulmonary symptoms.  Follow-up  PET CT scan in 5 weeks Return to clinic with Dr. Irene Limbo with port flush and labs in 6 weeks    . The total time spent in the appointment was 20 minutes and more than 50% was on counseling and direct patient cares.  All of the patient's questions were answered with apparent satisfaction. The patient knows to call the clinic with any problems, questions or concerns.    Sullivan Lone MD [place] AAHIVMS [facility] Hematology/Oncology Physician [facility]

## 2021-03-16 ENCOUNTER — Other Ambulatory Visit: Payer: Self-pay

## 2021-03-16 ENCOUNTER — Encounter: Payer: Self-pay | Admitting: Podiatry

## 2021-03-16 ENCOUNTER — Ambulatory Visit (INDEPENDENT_AMBULATORY_CARE_PROVIDER_SITE_OTHER): Payer: Medicare Other | Admitting: Podiatry

## 2021-03-16 DIAGNOSIS — G629 Polyneuropathy, unspecified: Secondary | ICD-10-CM

## 2021-03-16 DIAGNOSIS — M79674 Pain in right toe(s): Secondary | ICD-10-CM | POA: Diagnosis not present

## 2021-03-16 DIAGNOSIS — L84 Corns and callosities: Secondary | ICD-10-CM

## 2021-03-16 DIAGNOSIS — M79675 Pain in left toe(s): Secondary | ICD-10-CM | POA: Diagnosis not present

## 2021-03-16 DIAGNOSIS — B351 Tinea unguium: Secondary | ICD-10-CM

## 2021-03-16 DIAGNOSIS — M205X2 Other deformities of toe(s) (acquired), left foot: Secondary | ICD-10-CM | POA: Diagnosis not present

## 2021-03-21 NOTE — Progress Notes (Signed)
Subjective: Sarah [name] is a 87 y.o. female patient seen for at-risk foot care with h/o peripheral neuropathy. She is seen today for follow up of painful preulcerative corn left 3rd toe and thick toenails that are difficult to trim. Pain interferes with ambulation. Aggravating factors include wearing enclosed shoe gear. Pain is relieved with periodic professional debridement.  Patient did see Dr. Paulla Dolly who discussed surgical correction of digit, but patient and daughter want to explore this route right now.  New problems reported today: None.  PCP is Lajean Manes, MD. Last visit was: two months ago.  Allergies  Allergen Reactions   Citalopram Anxiety   Alendronate Sodium Other (See Comments)   Amoxicillin-Pot Clavulanate Other (See Comments)   Codeine Other (See Comments)    Reaction not recalled   Latex    Lisinopril Other (See Comments)   Septra [Sulfamethoxazole-Trimethoprim] Other (See Comments)   Thiazide-Type Diuretics Other (See Comments)    Blurry vision?? (patient stated she has macular degeneration)    Objective: Physical Exam  General: Patient is a pleasant 87 y.o. Caucasian female thin build in NAD. AAO x 3.   Neurovascular Examination: CFT <3 seconds b/l LE. Palpable DP/PT pulses b/l LE. Digital hair absent b/l. Skin temperature gradient WNL b/l. No pain with calf compression b/l. No edema noted b/l. Pedal hair present. Lower extremity skin temperature gradient within normal limits.  Pt has subjective symptoms of neuropathy. Protective sensation intact 5/5 intact bilaterally with 10g monofilament b/l.  Dermatological:  Pedal integument with normal turgor, texture and tone BLE. No open wounds b/l LE. No interdigital macerations noted b/l LE. Toenails 2-5 bilaterally and L hallux elongated, discolored, dystrophic, thickened, and crumbly with subungual debris and tenderness to dorsal palpation. Anonychia noted R hallux. Nailbed(s) epithelialized.  Preulcerative  lesion noted L 3rd toe. There is visible subdermal hemorrhage. There is no surrounding erythema, no edema, no drainage, no odor, no fluctuance.  Musculoskeletal:  Normal muscle strength 5/5 to all lower extremity muscle groups bilaterally. Hammertoe(s) noted to the L 2nd toe, L 4th toe, L 5th toe, R 2nd toe, R 3rd toe, R 4th toe, and R 5th toe. Clawtoe deformity L 3rd toe; flexible at DIPJ.  Assessment: 1. Pain due to onychomycosis of toenails of both feet   2. Pre-ulcerative corn or callous   3. Clawtoe, acquired, left   4. Neuropathy    Plan: Patient was evaluated and treated and all questions answered. Consent given for treatment as described below: -Presently, [name] does not want to entertain hammertoe repair. Discussed flexor tenotomy which could be performed in office and requires minimal recovery. Gave Dr. Lennette Bihari Patel's business card to daughter. They will think about it.. -Toenails 2-5 bilaterally and L hallux debrided in length and girth without iatrogenic bleeding with sterile nail nipper and dremel.  -Constructed coban dressing for daily application for cushioning of distal tip of left 3rd toe. Preulcerative lesion pared L 3rd toe. Total number pared=1. -Patient/POA to call should there be question/concern in the interim.  Return in about 2 months (around 05/27/2021).  Marzetta Board, DPM

## 2021-03-23 ENCOUNTER — Ambulatory Visit: Payer: Medicare Other | Admitting: Hematology

## 2021-03-23 ENCOUNTER — Emergency Department (HOSPITAL_COMMUNITY): Payer: Medicare Other

## 2021-03-23 ENCOUNTER — Ambulatory Visit (HOSPITAL_COMMUNITY): Payer: Medicare Other

## 2021-03-23 ENCOUNTER — Emergency Department (HOSPITAL_COMMUNITY)
Admission: EM | Admit: 2021-03-23 | Discharge: 2021-03-23 | Disposition: A | Discharge status: Home / Self Care | Payer: Medicare Other | Attending: Emergency Medicine | Admitting: Emergency Medicine

## 2021-03-23 ENCOUNTER — Encounter (HOSPITAL_COMMUNITY): Payer: Self-pay

## 2021-03-23 ENCOUNTER — Other Ambulatory Visit: Payer: Medicare Other

## 2021-03-23 DIAGNOSIS — Y9301 Activity, walking, marching and hiking: Secondary | ICD-10-CM | POA: Insufficient documentation

## 2021-03-23 DIAGNOSIS — J984 Other disorders of lung: Secondary | ICD-10-CM | POA: Diagnosis not present

## 2021-03-23 DIAGNOSIS — S59901A Unspecified injury of right elbow, initial encounter: Secondary | ICD-10-CM | POA: Diagnosis not present

## 2021-03-23 DIAGNOSIS — Z91040 Latex allergy status: Secondary | ICD-10-CM | POA: Diagnosis not present

## 2021-03-23 DIAGNOSIS — S79911A Unspecified injury of right hip, initial encounter: Secondary | ICD-10-CM | POA: Diagnosis not present

## 2021-03-23 DIAGNOSIS — S0031XA Abrasion of nose, initial encounter: Secondary | ICD-10-CM | POA: Diagnosis not present

## 2021-03-23 DIAGNOSIS — I10 Essential (primary) hypertension: Secondary | ICD-10-CM | POA: Insufficient documentation

## 2021-03-23 DIAGNOSIS — Z79899 Other long term (current) drug therapy: Secondary | ICD-10-CM | POA: Insufficient documentation

## 2021-03-23 DIAGNOSIS — W010XXA Fall on same level from slipping, tripping and stumbling without subsequent striking against object, initial encounter: Secondary | ICD-10-CM | POA: Insufficient documentation

## 2021-03-23 DIAGNOSIS — W19XXXA Unspecified fall, initial encounter: Secondary | ICD-10-CM

## 2021-03-23 DIAGNOSIS — R220 Localized swelling, mass and lump, head: Secondary | ICD-10-CM | POA: Diagnosis not present

## 2021-03-23 DIAGNOSIS — M1611 Unilateral primary osteoarthritis, right hip: Secondary | ICD-10-CM | POA: Diagnosis not present

## 2021-03-23 DIAGNOSIS — S0993XA Unspecified injury of face, initial encounter: Secondary | ICD-10-CM | POA: Diagnosis not present

## 2021-03-23 DIAGNOSIS — M2578 Osteophyte, vertebrae: Secondary | ICD-10-CM | POA: Diagnosis not present

## 2021-03-23 DIAGNOSIS — S199XXA Unspecified injury of neck, initial encounter: Secondary | ICD-10-CM | POA: Diagnosis not present

## 2021-03-23 DIAGNOSIS — M25551 Pain in right hip: Secondary | ICD-10-CM

## 2021-03-23 DIAGNOSIS — Z7901 Long term (current) use of anticoagulants: Secondary | ICD-10-CM | POA: Diagnosis not present

## 2021-03-23 DIAGNOSIS — Z96653 Presence of artificial knee joint, bilateral: Secondary | ICD-10-CM | POA: Diagnosis not present

## 2021-03-23 DIAGNOSIS — R609 Edema, unspecified: Secondary | ICD-10-CM | POA: Diagnosis not present

## 2021-03-23 DIAGNOSIS — E039 Hypothyroidism, unspecified: Secondary | ICD-10-CM | POA: Diagnosis not present

## 2021-03-23 DIAGNOSIS — S0990XA Unspecified injury of head, initial encounter: Secondary | ICD-10-CM | POA: Diagnosis not present

## 2021-03-23 DIAGNOSIS — S50311A Abrasion of right elbow, initial encounter: Secondary | ICD-10-CM | POA: Diagnosis not present

## 2021-03-23 DIAGNOSIS — Z452 Encounter for adjustment and management of vascular access device: Secondary | ICD-10-CM | POA: Diagnosis not present

## 2021-03-23 DIAGNOSIS — Z853 Personal history of malignant neoplasm of breast: Secondary | ICD-10-CM | POA: Diagnosis not present

## 2021-03-23 MED ORDER — ACETAMINOPHEN 325 MG PO TABS: 650.0000 mg | ORAL_TABLET | Freq: Once | ORAL | Status: DC

## 2021-03-23 NOTE — ED Provider Notes (Signed)
Emergency Medicine Provider Triage Evaluation Note  Sarah [name] , a 87 y.o. female  was evaluated in triage.  Pt complains of a trip and fall roughly an hour ago.  Patient was walking with a pitcher of water hand when she fell forward striking her face against the concrete.  Patient is anticoagulated with Eliquis.  She denies any loss of consciousness.  Complains of nose pain with associated bleeding and ecchymosis.  Review of Systems  Positive:  Negative: See above   Physical Exam  BP (!) 150/86 (BP Location: Left Arm)   Pulse 78   Temp (!) 97.5 F (36.4 C) (Oral)   Resp 18   SpO2 94%  Gen:   Awake, no distress   Resp:  Normal effort  MSK:   Moves extremities without difficulty  Other:  Ecchymosis and superficial abrasion to the bridge of the nose.  Answers all questions appropriately.  Medical Decision Making  Medically screening exam initiated at 3:00 PM.  Appropriate orders placed.  Sarah [name] was informed that the remainder of the evaluation will be completed by another provider, this initial triage assessment does not replace that evaluation, and the importance of remaining in the ED until their evaluation is complete.     Myna Bright Dazey, PA-C 03/23/21 1501    Carmin Muskrat, MD 03/23/21 1718

## 2021-03-23 NOTE — ED Triage Notes (Signed)
Pt presents with c/o fall that occurred today. Pt was attempting to water her plants and fell over. Pt has an abrasion to her right elbow and right hip and an abrasion and some swelling to the bridge of her nose.

## 2021-03-23 NOTE — Discharge Instructions (Signed)
It was a pleasure taking care of you today.  As discussed, all of your images were negative for any acute abnormalities. No broken bones. You may take over-the-counter Tylenol as needed for pain.  Please follow-up with PCP within 1 week for further evaluation.  Return to the ER for any worsening symptoms.

## 2021-03-23 NOTE — ED Provider Notes (Signed)
I provided a substantive portion of the care of this patient.  I personally performed the entirety of the history, exam, and medical decision making for this encounter.    Patient seen by me along with physician assistant.  Patient had a fall today.  Patient was attempting to water plants and fell over..  She fell forward.  Injuring her right elbow right hip and landing on her face as well she has an abrasion to the bridge of her nose.  Slight deformity to the nose as well.  Patient also complains of some slight low back pain.  That seems to have come on later.  Did not seem to be related at the time of the fall.  Patient is on blood thinner Eliquis for history of deep vein thrombosis and pulmonary embolus.  Patient head CT CT neck and face without any acute findings.  Also had x-ray of right hip and pelvis without any bony abnormalities.  And right elbow.  Patient will be ambulated.  If she has significant right hip pain then she will get a CT scan of her hip.  Patient is here with her daughter.   Fredia Sorrow, MD 03/23/21 [phone]

## 2021-03-23 NOTE — ED Provider Notes (Signed)
[facility] DEPT Provider Note   CSN: [id number] Arrival date & time: 03/23/21  1414     History Chief Complaint  Patient presents with   Sarah [name] is a 87 y.o. female with a past medical history as noted below who presents to the ED after a mechanical fall.  Patient was walking with a pitcher of water and tripped and fell directly on her face onto concrete.  No loss of consciousness.  Patient is chronically on Eliquis.  Patient endorses pain on the bridge of her nose, right elbow and right hip.  Patient states she was unable to ambulate following the fall due to hip pain.  Denies headache, changes to vision, changes to speech, unilateral weakness, and back pain.  Denies numbness/tingling to lower extremities. Hip pain worse with palpation and movement. No treatment prior to arrival.  History obtained from patient, daughter at bedside, and past medical records. No interpreter used during encounter.       Past Medical History:  Diagnosis Date   Allergy    codeine, thiazides   Anxiety    new dx   Arthritis    Breast cancer ([place]) 03/14/12   bx=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,ER/PR=+,upper inner quad   Cancer (H[facility])    breast   Cataract    DVT (deep venous thrombosis) ([place]) 02/2019   left leg   Dyspnea    Edema    both legs feet and toe, abdomen   Glaucoma    laser treated years ago   HOH (hard of hearing)    Hyperlipemia    Hypertension    Hypothyroidism    Pancreatic cyst    benign   PONV (postoperative nausea and vomiting)    Pulmonary embolism ([place]) 02/2019   bilateral    Radiation 06/11/2012-07/12/2012   17 sessions 4250 cGy, 3 sessions 750 cGy   Vertigo    Wears glasses    Wears partial dentures    partial upper    Patient Active Problem List   Diagnosis Date Noted   Neuropathy 10/27/2020   Abnormal gait 06/22/2020   Age-related osteoporosis without current pathological fracture  06/22/2020   Anxiety 06/22/2020   Hardening of the aorta (main artery of the heart) ([place]) 06/22/2020   Malignant lymphoma, non-Hodgkin's ([place]) 06/22/2020   Mild cognitive disorder 06/22/2020   Non-pressure chronic ulcer of unspecified part of unspecified lower leg with unspecified severity ([place]) 06/22/2020   Primary insomnia 06/22/2020   Pulmonary nodule 06/22/2020   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   Abnormal chest x-ray 12/31/2019   Cough 12/31/2019   Port-A-Cath in place 06/04/2019   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   Counseling regarding advance care planning and goals of care 04/14/2019   Lymphadenopathy of head and neck 03/17/2019   Lymph nodes enlarged 03/17/2019   History of pulmonary embolus ([facility]) 03/17/2019   Acute pulmonary embolism ([place]) 03/06/2019   Diffuse lymphadenopathy 03/06/2019   Pleural effusion 03/06/2019   Pruritus 02/06/2019   Pruritic rash 02/06/2019   Angio-edema 02/06/2019   Shortness of breath 02/06/2019   Osteopenia 12/18/2013   Heart block AV first degree 12/18/2013   Breast cancer of upper-inner quadrant of right female breast ([place]) 03/19/2012   Right knee DJD 08/18/2011   Hypothyroidism    Hypertension    PONV (postoperative nausea and vomiting)    Hyperlipemia     Past Surgical History:  Procedure Laterality Date   ABDOMINAL HYSTERECTOMY  1966   1/2  ovary left in    [place]   lumpectomy-lt   CATARACT EXTRACTION     b/l   COLONOSCOPY     EXCISION MASS NECK Left 03/17/2019    EXCISION MASS NECK (Left Neck)   EXCISION MASS NECK Left 03/17/2019   Procedure: EXCISION MASS NECK;  Surgeon: Helayne Seminole, MD;  Location: [facility];  Service: ENT;  Laterality: Left;   EYE SURGERY Bilateral    bilateral cataract removal   IR IMAGING GUIDED PORT INSERTION  04/23/2019   JOINT REPLACEMENT  2013   rt total knee   JOINT REPLACEMENT  1995   lt total knee   PARTIAL MASTECTOMY WITH NEEDLE LOCALIZATION  04/09/2012   Procedure:  PARTIAL MASTECTOMY WITH NEEDLE LOCALIZATION;  Surgeon: Adin Hector, MD;  Location: [facility];  Service: General;  Laterality: Right;   SKIN BIOPSY Left 03/17/2019   LEFT THIGH   SKIN BIOPSY Left 03/17/2019   Procedure: Skin Biopsy Left Thigh;  Surgeon: Helayne Seminole, MD;  Location: [facility];  Service: ENT;  Laterality: Left;   TONSILLECTOMY     TOTAL KNEE ARTHROPLASTY  08/16/2011   Procedure: TOTAL KNEE ARTHROPLASTY;  Surgeon: Ninetta Lights, MD;  Location: [facility];  Service: Orthopedics;  Laterality: Right;     OB History   No obstetric history on file.     Family History  Problem Relation Age of Onset   Heart disease Father    Cancer Maternal Aunt        stomach    Social History   Tobacco Use   Smoking status: Never   Smokeless tobacco: Never  Vaping Use   Vaping Use: Never used  Substance Use Topics   Alcohol use: No   Drug use: No    Home Medications Prior to Admission medications   Medication Sig Start Date End Date Taking? Authorizing Provider  amLODipine (NORVASC) 2.5 MG tablet Take 2.5-5 mg by mouth See admin instructions. 5 mg in the morning, 2.5 mg in the evening Patient not taking: Reported on 01/26/2021 12/21/18   [provider]  apixaban (ELIQUIS) 2.5 MG TABS tablet Take 1 tablet (2.5 mg total) by mouth 2 (two) times daily. 07/05/20   Brunetta Genera, MD  Cholecalciferol (VITAMIN D) 50 MCG (2000 UT) tablet Take 2,000 Units by mouth 2 (two) times daily.    [provider]  citalopram (CELEXA) 10 MG tablet Take 10 mg by mouth daily. 03/01/21   [provider]  clonazepam (KLONOPIN) 0.125 MG disintegrating tablet Take 1 tablet (0.125 mg total) by mouth 2 (two) times daily. Patient not taking: No sig reported 03/23/20   Brunetta Genera, MD  Cyanocobalamin (VITAMIN B-12 PO) Take 3,000 mcg by mouth daily.  Patient not taking: Reported on 01/26/2021    [provider]  escitalopram (LEXAPRO) 10 MG  tablet Take 1 tablet (10 mg total) by mouth daily. 02/25/21   Brunetta Genera, MD  gabapentin (NEURONTIN) 100 MG capsule Take 2 capsules (200 mg total) by mouth at bedtime. Patient not taking: No sig reported 09/29/20   Brunetta Genera, MD  levofloxacin (LEVAQUIN) 500 MG tablet Take 1 tablet (500 mg total) by mouth daily. Consider taking over-the-counter probiotics to reduce the risk of diarrhea from antibiotics. 02/03/21   Brunetta Genera, MD  levothyroxine (SYNTHROID) 25 MCG tablet Take 25 mcg by mouth daily before breakfast.  09/20/18   [provider]  lidocaine-prilocaine (EMLA) cream Apply  to affected area once 04/13/20   Brunetta Genera, MD  LORazepam (ATIVAN) 0.5 MG tablet Take 1 tablet (0.5 mg total) by mouth 2 (two) times daily as needed for anxiety or sleep. 12/16/20   Brunetta Genera, MD  Multiple Vitamins-Minerals (PRESERVISION AREDS PO) Take 1 capsule by mouth 2 (two) times daily.     [provider]  ondansetron (ZOFRAN) 8 MG tablet Take 1 tablet (8 mg total) by mouth 2 (two) times daily as needed. Start on the third day after chemotherapy. Patient not taking: No sig reported 04/14/19   Brunetta Genera, MD  polyethylene glycol (MIRALAX / GLYCOLAX) 17 g packet Take 17 g by mouth daily. Patient not taking: Reported on 01/26/2021    [provider]  sertraline (ZOLOFT) 25 MG tablet Take 25 mg by mouth daily. 10/06/20   [provider]  sodium chloride (MURO 128) 5 % ophthalmic ointment Place 1 application into both eyes at bedtime.    [provider]  XIIDRA 5 % SOLN Place 1 drop into both eyes daily as needed (dry eyes). 10/22/19   [provider]  zolpidem (AMBIEN) 5 MG tablet Take 5 mg by mouth at bedtime as needed for sleep.  12/07/19   [provider]    Allergies    Citalopram, Alendronate sodium, Amoxicillin-pot clavulanate, Codeine, Latex, Lisinopril, Septra [sulfamethoxazole-trimethoprim], and  Thiazide-type diuretics  Review of Systems   Review of Systems  Musculoskeletal:  Positive for arthralgias.  Skin:  Positive for color change and wound.  Neurological:  Negative for dizziness, weakness, numbness and headaches.  All other systems reviewed and are negative.  Physical Exam Updated Vital Signs BP (!) 144/83   Pulse 76   Temp (!) 97.5 F (36.4 C) (Oral)   Resp 17   SpO2 95%   Physical Exam Vitals and nursing note reviewed.  Constitutional:      General: She is not in acute distress.    Appearance: She is not ill-appearing.  HENT:     Head: Normocephalic.     Nose:     Comments: Superficial abrasion to bridge of nose with surrounding ecchymosis.  No septal hematomas. Eyes:     Pupils: Pupils are equal, round, and reactive to light.  Neck:     Comments: No cervical midline tenderness Cardiovascular:     Rate and Rhythm: Normal rate and regular rhythm.     Pulses: Normal pulses.     Heart sounds: Normal heart sounds. No murmur heard.   No friction rub. No gallop.  Pulmonary:     Effort: Pulmonary effort is normal.     Breath sounds: Normal breath sounds.  Abdominal:     General: Abdomen is flat. There is no distension.     Palpations: Abdomen is soft.     Tenderness: There is no abdominal tenderness. There is no guarding or rebound.  Musculoskeletal:        General: Normal range of motion.     Cervical back: Neck supple.     Comments: Tenderness palpation to right olecranon process.  No edema.  Radial pulse intact.  Soft compartments.  No thoracic or lumbar midline tenderness. Tenderness throughout posterior aspect of right hip  Skin:    General: Skin is warm and dry.     Comments: Superficial abrasion to right elbow.  Neurological:     General: No focal deficit present.     Mental Status: She is alert.  Psychiatric:  Mood and Affect: Mood normal.        Behavior: Behavior normal.    ED Results / Procedures / Treatments   Labs (all labs  ordered are listed, but only abnormal results are displayed) Labs Reviewed - No data to display  EKG None  Radiology DG Elbow Complete Right  Result Date: 03/23/2021 CLINICAL DATA:  Injury, fall. EXAM: RIGHT ELBOW - COMPLETE 3+ VIEW COMPARISON:  None. FINDINGS: There is no evidence of fracture, dislocation, or joint effusion. There is no evidence of arthropathy or other focal bone abnormality. Soft tissues are unremarkable. IMPRESSION: Negative. Electronically Signed   By: Ronney Asters M.D.   On: 03/23/2021 17:14   CT Head Wo Contrast  Result Date: 03/23/2021 CLINICAL DATA:  Head trauma, mod-severe head injury on eliquis; Facial trauma, nose ecchymosis and facial injury EXAM: CT HEAD WITHOUT CONTRAST CT MAXILLOFACIAL WITHOUT CONTRAST TECHNIQUE: Multidetector CT imaging of the head and maxillofacial structures were performed using the standard protocol without intravenous contrast. Multiplanar CT image reconstructions of the maxillofacial structures were also generated. COMPARISON:  None. FINDINGS: CT HEAD FINDINGS Brain: There is no acute intracranial hemorrhage or mass effect gray-white differentiation is preserved. Patchy hypodensity in the supratentorial white matter is nonspecific probably reflects moderate chronic microvascular ischemic changes. Prominence of the ventricles and sulci reflects parenchymal volume loss. No extra-axial collection. Vascular: No hyperdense vessel. There is intracranial atherosclerotic calcification at the skull base. Skull: Unremarkable. Other: Mastoid air cells are clear. CT MAXILLOFACIAL FINDINGS Osseous: No acute facial fracture. Orbits: No intraorbital hematoma. Sinuses: Minor mucosal thickening. Soft tissues: Mild low frontal scalp soft tissue swelling. IMPRESSION: No evidence of acute intracranial injury.  No acute facial fracture. Electronically Signed   By: Macy Mis M.D.   On: 03/23/2021 15:36   CT Cervical Spine Wo Contrast  Result Date:  03/23/2021 CLINICAL DATA:  Fall.  Neck trauma. EXAM: CT CERVICAL SPINE WITHOUT CONTRAST TECHNIQUE: Multidetector CT imaging of the cervical spine was performed without intravenous contrast. Multiplanar CT image reconstructions were also generated. COMPARISON:  CT neck 02/27/2019. FINDINGS: Alignment: There is 2 mm of anterolisthesis at C7-T1 and T1-T2, likely degenerative. Alignment is otherwise anatomic. Skull base and vertebrae: No acute fracture. No primary bone lesion or focal pathologic process. Soft tissues and spinal canal: No prevertebral fluid or swelling. No visible canal hematoma. Right-sided central venous catheter is partially visualized. Disc levels: There is disc space narrowing and endplate osteophyte formation at C5-C6 and C6-C7 as well as C7-T1. There is left-sided neural foraminal stenosis at these levels. There is no significant central canal stenosis identified. Upper chest: There is pleuroparenchymal scarring in the lung apices. Other: None. IMPRESSION: No acute fracture or traumatic subluxation of the cervical spine. Electronically Signed   By: Ronney Asters M.D.   On: 03/23/2021 17:46   DG Hip Unilat W or Wo Pelvis 2-3 Views Right  Result Date: 03/23/2021 CLINICAL DATA:  Injury. EXAM: DG HIP (WITH OR WITHOUT PELVIS) 2-3V RIGHT COMPARISON:  Right hip x-ray 01/27/2021. FINDINGS: There is no evidence of hip fracture or dislocation. Mild degenerative changes of the right hip appear similar to the prior study. Soft tissues are within normal limits. IMPRESSION: 1. No acute bony abnormality of the right hip. Electronically Signed   By: Ronney Asters M.D.   On: 03/23/2021 17:16   CT Maxillofacial Wo Contrast  Result Date: 03/23/2021 CLINICAL DATA:  Head trauma, mod-severe head injury on eliquis; Facial trauma, nose ecchymosis and facial injury EXAM: CT HEAD WITHOUT  CONTRAST CT MAXILLOFACIAL WITHOUT CONTRAST TECHNIQUE: Multidetector CT imaging of the head and maxillofacial structures were  performed using the standard protocol without intravenous contrast. Multiplanar CT image reconstructions of the maxillofacial structures were also generated. COMPARISON:  None. FINDINGS: CT HEAD FINDINGS Brain: There is no acute intracranial hemorrhage or mass effect gray-white differentiation is preserved. Patchy hypodensity in the supratentorial white matter is nonspecific probably reflects moderate chronic microvascular ischemic changes. Prominence of the ventricles and sulci reflects parenchymal volume loss. No extra-axial collection. Vascular: No hyperdense vessel. There is intracranial atherosclerotic calcification at the skull base. Skull: Unremarkable. Other: Mastoid air cells are clear. CT MAXILLOFACIAL FINDINGS Osseous: No acute facial fracture. Orbits: No intraorbital hematoma. Sinuses: Minor mucosal thickening. Soft tissues: Mild low frontal scalp soft tissue swelling. IMPRESSION: No evidence of acute intracranial injury.  No acute facial fracture. Electronically Signed   By: Macy Mis M.D.   On: 03/23/2021 15:36    Procedures Procedures   Medications Ordered in ED Medications  acetaminophen (TYLENOL) tablet 650 mg (has no administration in time range)    ED Course  I have reviewed the triage vital signs and the nursing notes.  Pertinent labs & imaging results that were available during my care of the patient were reviewed by me and considered in my medical decision making (see chart for details).    MDM Rules/Calculators/A&P                          87 year old female presents to the ED after a mechanical fall.  She is chronically on Eliquis.  No loss of consciousness.  Upon arrival, stable vitals.  Patient in no acute distress.  Physical exam significant for superficial abrasion to bridge of nose and right elbow.  CT head/maxillofacial ordered at triage which are negative for any acute abnormalities.  Added x-ray of right hip and elbow.  CT cervical spine added. Tylenol  given.  X-rays negative. CT cervical spine negative. I personally ambulated patient with walker without any difficulties. Low suspicion for occult hip fracture. Advised patient to take over-the-counter Tylenol as needed for pain.  Follow-up with PCP within 1 week. Strict ED precautions discussed with patient. Patient states understanding and agrees to plan. Patient discharged home in no acute distress and stable vitals  Discussed with Dr. Rogene Houston who evaluated patient at bedside and agrees with assessment and plan.  Final Clinical Impression(s) / ED Diagnoses Final diagnoses:  Fall, initial encounter  Right hip pain    Rx / DC Orders ED Discharge Orders     None        Karie Kirks 03/23/21 1816    Fredia Sorrow, MD 03/23/21 2141

## 2021-03-28 ENCOUNTER — Other Ambulatory Visit: Payer: Self-pay | Admitting: Hematology

## 2021-03-31 ENCOUNTER — Encounter (HOSPITAL_COMMUNITY)
Admission: RE | Admit: 2021-03-31 | Discharge: 2021-03-31 | Disposition: A | Discharge status: Home / Self Care | Payer: Medicare Other | Source: Ambulatory Visit | Attending: Hematology | Admitting: Hematology

## 2021-03-31 ENCOUNTER — Other Ambulatory Visit: Payer: Self-pay

## 2021-03-31 DIAGNOSIS — C8440 Peripheral T-cell lymphoma, not classified, unspecified site: Secondary | ICD-10-CM | POA: Diagnosis not present

## 2021-03-31 DIAGNOSIS — C8590 Non-Hodgkin lymphoma, unspecified, unspecified site: Secondary | ICD-10-CM | POA: Diagnosis not present

## 2021-03-31 DIAGNOSIS — I700 Atherosclerosis of aorta: Secondary | ICD-10-CM | POA: Diagnosis not present

## 2021-03-31 DIAGNOSIS — J439 Emphysema, unspecified: Secondary | ICD-10-CM | POA: Diagnosis not present

## 2021-03-31 DIAGNOSIS — I2510 Atherosclerotic heart disease of native coronary artery without angina pectoris: Secondary | ICD-10-CM | POA: Diagnosis not present

## 2021-03-31 LAB — GLUCOSE, CAPILLARY: Glucose-Capillary: 100 mg/dL — ABNORMAL HIGH (ref 70–99)

## 2021-03-31 MED ORDER — FLUDEOXYGLUCOSE F - 18 (FDG) INJECTION
5.2000 | Freq: Once | INTRAVENOUS | Status: AC
  Administered 2021-03-31: 5.19 via INTRAVENOUS

## 2021-04-01 ENCOUNTER — Ambulatory Visit: Payer: Medicare Other | Admitting: Hematology

## 2021-04-01 ENCOUNTER — Other Ambulatory Visit: Payer: Medicare Other

## 2021-04-07 ENCOUNTER — Other Ambulatory Visit: Payer: Self-pay

## 2021-04-07 ENCOUNTER — Inpatient Hospital Stay (HOSPITAL_BASED_OUTPATIENT_CLINIC_OR_DEPARTMENT_OTHER): Payer: Medicare Other | Admitting: Hematology

## 2021-04-07 ENCOUNTER — Inpatient Hospital Stay: Payer: Medicare Other | Attending: Hematology

## 2021-04-07 VITALS — BP 151/80 | HR 77 | Temp 97.2°F | Resp 18 | Wt 101.9 lb

## 2021-04-07 DIAGNOSIS — Z95828 Presence of other vascular implants and grafts: Secondary | ICD-10-CM

## 2021-04-07 DIAGNOSIS — Z452 Encounter for adjustment and management of vascular access device: Secondary | ICD-10-CM | POA: Diagnosis not present

## 2021-04-07 DIAGNOSIS — C8440 Peripheral T-cell lymphoma, not classified, unspecified site: Secondary | ICD-10-CM

## 2021-04-07 DIAGNOSIS — Z853 Personal history of malignant neoplasm of breast: Secondary | ICD-10-CM | POA: Insufficient documentation

## 2021-04-07 DIAGNOSIS — G629 Polyneuropathy, unspecified: Secondary | ICD-10-CM | POA: Diagnosis not present

## 2021-04-07 DIAGNOSIS — C865 Angioimmunoblastic T-cell lymphoma: Secondary | ICD-10-CM | POA: Insufficient documentation

## 2021-04-07 DIAGNOSIS — Z5111 Encounter for antineoplastic chemotherapy: Secondary | ICD-10-CM

## 2021-04-07 LAB — CBC WITH DIFFERENTIAL/PLATELET
Abs Immature Granulocytes: 0.02 10*3/uL (ref 0.00–0.07)
Basophils Absolute: 0 10*3/uL (ref 0.0–0.1)
Basophils Relative: 1 %
Eosinophils Absolute: 0.1 10*3/uL (ref 0.0–0.5)
Eosinophils Relative: 1 %
HCT: 43.3 % (ref 36.0–46.0)
Hemoglobin: 13.7 g/dL (ref 12.0–15.0)
Immature Granulocytes: 0 %
Lymphocytes Relative: 12 %
Lymphs Abs: 0.6 10*3/uL — ABNORMAL LOW (ref 0.7–4.0)
MCH: 28.9 pg (ref 26.0–34.0)
MCHC: 31.6 g/dL (ref 30.0–36.0)
MCV: 91.4 fL (ref 80.0–100.0)
Monocytes Absolute: 0.4 10*3/uL (ref 0.1–1.0)
Monocytes Relative: 8 %
Neutro Abs: 3.7 10*3/uL (ref 1.7–7.7)
Neutrophils Relative %: 78 %
Platelets: 178 10*3/uL (ref 150–400)
RBC: 4.74 MIL/uL (ref 3.87–5.11)
RDW: 14.4 % (ref 11.5–15.5)
WBC: 4.7 10*3/uL (ref 4.0–10.5)
nRBC: 0 % (ref 0.0–0.2)

## 2021-04-07 LAB — CMP (CANCER CENTER ONLY)
ALT: 12 U/L (ref 0–44)
AST: 16 U/L (ref 15–41)
Albumin: 4.1 g/dL (ref 3.5–5.0)
Alkaline Phosphatase: 82 U/L (ref 38–126)
Anion gap: 10 (ref 5–15)
BUN: 28 mg/dL — ABNORMAL HIGH (ref 8–23)
CO2: 29 mmol/L (ref 22–32)
Calcium: 9.8 mg/dL (ref 8.9–10.3)
Chloride: 104 mmol/L (ref 98–111)
Creatinine: 0.78 mg/dL (ref 0.44–1.00)
GFR, Estimated: >60 mL/min (ref >60)
Glucose, Bld: 151 mg/dL — ABNORMAL HIGH (ref 70–99)
Potassium: 3.8 mmol/L (ref 3.5–5.1)
Sodium: 143 mmol/L (ref 135–145)
Total Bilirubin: 0.5 mg/dL (ref 0.3–1.2)
Total Protein: 6.9 g/dL (ref 6.5–8.1)

## 2021-04-07 LAB — LACTATE DEHYDROGENASE: LDH: 210 U/L — ABNORMAL HIGH (ref 98–192)

## 2021-04-07 MED ORDER — HEPARIN SOD (PORK) LOCK FLUSH 100 UNIT/ML IV SOLN
500.0000 [IU] | Freq: Once | INTRAVENOUS | Status: AC
  Administered 2021-04-07: 13:00:00 500 [IU]

## 2021-04-07 MED ORDER — SODIUM CHLORIDE 0.9% FLUSH
10.0000 mL | Freq: Once | INTRAVENOUS | Status: AC
  Administered 2021-04-07: 13:00:00 10 mL

## 2021-04-14 ENCOUNTER — Encounter: Payer: Self-pay | Admitting: Hematology

## 2021-04-15 DIAGNOSIS — Z23 Encounter for immunization: Secondary | ICD-10-CM | POA: Diagnosis not present

## 2021-04-18 NOTE — Progress Notes (Signed)
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 female who has been referred to [place] by Lajean Manes, MD for evaluation and management of Possible lymphoma . She is accompanied today by her daughter Sarah [name] . The pt reports that she is doing well overall.   Pending Surgical pathology from 03/17/2019 Dr.Manny will contact clinic about pathology   August 25th/27th she began coughing and sneezing. On Sept 3 went to doctors and they said it was allergies. She began having rashes (little red dots). Went to PCP and they prescribed prednisone and for several days and the medication helped. But as soon as she completed the prescription symptoms reoccurred.   She had a chest X Ray done because she was having a severe cough on 02/17/2019 revealing "bilateral effusions in this patient with history of breast cancer,of uncertain significance with question of right lower lobe pulmonary nodule, consider chest CT for further assessment. Atherosclerotic changes in the thoracic aorta."  Dr. Amy Martinique biopsied the rashes on her back 02/21/2019 and it was determined that it could be because of an allergy to medication. Dr.Stoneking stopped her blood pressure medication from September- October but ther rash still present. The itching started 1 to 2 months ago. The rash would come and go and was mostly on her back. She still has rash on her left thigh.  She is using triaconazole and cetrizine.  She has lumps behind ear and abdomin and she states that they have swollen twice the size within 1 and half months  She has no appetite and has changes in  her taste that began around August. She has changes in bowels. They are more loose and smaller In size. She also has no control over bowels.  She feels fatigued and it has increased since August.   She has a cough that starts in September  that has made her hoarse.   She had a CT scan on 02/27/2019 because she was having severe cough and her stomach was very extended. Revealing "1. Extensive lymphadenopathy throughout the chest, abdomen, and pelvis, with index lymph nodes identified above. Splenomegaly. Findings are most consistent with lymphoma with recurrent, metastatic breast malignancy less favored. 2. There is generally osteopenic appearance of the skeleton. There are numerous subtle hypodense lesions, particularly of the pelvis (e.g. Series 2, image 88) and select vertebral bodies, for example L4 (series 8, image 92). This is suspicious although not definitive for osseous metastatic disease. Characterization for metabolic activity by nuclear scintigraphic bone scan or PET-CT would be helpful to further evaluate. 3. Moderate right, small left pleural effusions with associated atelectasis or consolidation. Subpleural radiation fibrosis of the anterior right lung. 4. There is a new subpleural pulmonary nodule of the anterior right middle lobe measuring 6 mm (series 4, image 105), nonspecific. There is no obvious pleural thickening or nodularity definitive for metastatic disease. 5.  Small volume ascites. 6. There is a fluid attenuation lesion of the pancreatic uncinate measuring 2.4 x 1.3 cm (series 2, image 63), not substantially changed when compared to remote prior MR examination dated 02/23/2012 and likely sequelae of a prior pseudocyst versus incidental pancreatic IPMN. 7.  Cardiomegaly."  Hospitalized from 03/06/2019-03/08/2019. She presenting with acute shortness of breath, leg swelling, left hand swelling and some weight gain despite poor appetite. In ED, CTA chest revealed at least submassive PE,  moderate right pleural effusion, diffuse LAD concerning for lymphoma.  Started on heparin drip.  PCCM consulted for guidance.  She had thoracocentesis with removal of 1200 cc cloudy exudative pleural fluid.  Cardiology consulted for elevated which was thought to be demand ischemia from right heart strain in the setting of PE. Patient was transitioned to subcu Lovenox by pulmonology the next day and remained stable. Unusual appearance of tracheal air column at the thoracic inlet. Correlate with any history of swallowing difficulty or changes in voice with dedicated imaging with chest CT and or CT of the neck as indicated.  She has a nonhealing ulcer on her right tibialis anterior that starting weeping one week ago. Of note since the patient's last visit, pt has had CHEST XRAY - 2 VIEW (Accession [id number]) completed on 02/17/2019 with results revealing "Bilateral effusions in this patient with history of breast cancer,of uncertain significance with question of right lower lobepulmonary nodule, consider chest CT for furtherassessment. Unusual appearance of tracheal air column at the thoracic inlet.Correlate with any history of swallowing difficulty or changes in voice with dedicated imaging with chest CT and or CT of the neck as Indicated. Atherosclerotic changes in the thoracic aorta."  Of note since the patient's last visit, pt has had CT CHEST, ABDOMEN, AND PELVIS WITH CONTRAST (Accession [id number]) completed on 02/27/2019 with results revealing "1. Extensive lymphadenopathy throughout the chest, abdomen, and pelvis, with index lymph nodes identified above. Splenomegaly.Findings are most consistent with lymphoma with recurrent, metastatic breast malignancy less favored. 2. There is generally osteopenic appearance of the skeleton. There are numerous subtle hypodense lesions, particularly of the pelvis (e.g. Series 2, image 88) and select vertebral bodies, for example L4 (series 8, image 92). This is  suspicious although not definitive for osseous metastatic disease. Characterization for metabolic activity by nuclear scintigraphic bone scan or PET-CT would be helpful to further evaluate.   3. Moderate right, small left pleural effusions with associated atelectasis or consolidation. Subpleural radiation fibrosis of the anterior right lung.4. There is a new subpleural pulmonary nodule of the anterior right middle lobe measuring 6 mm (series 4, image 105), nonspecific. There is no obvious pleural thickening or nodularity definitive for metastatic disease. 5.  Small volume ascites. 6. There is a fluid attenuation lesion of the pancreatic uncinate measuring 2.4 x 1.3 cm (series 2, image 63), not substantially changed when compared to remote prior MR examination dated 02/23/2012 and likely sequelae of a prior pseudocyst versus incidental pancreatic IPMN.7.  Cardiomegaly. 8. Other chronic and incidental findings as detailed above. Aortic Atherosclerosis (ICD10-I70.0)."  Of note since the patient's last visit, pt has had PORTABLE CHEST 1 VIEW (Accession [id number]) completed on 03/06/2019 with results revealing "1. Moderate size right and small left pleural effusions appear not significantly changed since 02/27/19. 2. No new cardiopulmonary abnormality."  Of note since the patient's last visit, pt has had ECHOCARDIOGRAM  completed on 03/06/2019 with results revealing  "1. Left ventricular ejection fraction, by visual estimation, is 60 to 65%. The left ventricle has normal function. Normal left ventricular size. There is no left ventricular hypertrophy. 2. Left ventricular diastolic Doppler parameters are consistent with impaired relaxation pattern of LV diastolic filling. 3. Global right ventricle has mildly reduced systolic function.The right ventricular size is moderately enlarged. No increase in right  ventricular wall thickness. 4. Left atrial size was normal. 5. Right atrial size was mildly dilated. 6.  Trivial pericardial effusion is present. 7. The mitral valve is normal in structure. Trace mitral valve regurgitation. No evidence of mitral stenosis. 8. The tricuspid valve is normal in structure. Tricuspid valve regurgitation severe. 9. The aortic valve is normal in structure. Aortic valve regurgitation was not visualized by color flow Doppler. Structurally normal aortic valve, with no evidence of sclerosis or stenosis.10. The pulmonic valve was normal in structure. Pulmonic valve regurgitation is mild by color flow Doppler.11. Mildly elevated pulmonary artery systolic pressure.12. The inferior vena cava is normal in size with greater than 50% respiratory variability, suggesting right atrial pressure of 3 mmHg."  Of note since the patient's last visit, pt has had CT ANGIOGRAPHY CHEST WITH CONTRAST (Accession [id number]) completed on 03/06/2019 with results revealing "1. Pulmonary embolus arising from the distal left main pulmonary artery with extension into multiple left lower lobe pulmonary arterial branches. There is also incompletely obstructing pulmonary embolus in the proximal left upper lobe pulmonary artery. More distal pulmonary embolus in the right lower lobe pulmonary artery. Positive for acute PE with CT evidence of right heart strain (RV/LV Ratio = 1.6) consistent with at least submassive (intermediate risk) PE. The presence of right heart strain has been associated with an increased risk of morbidity and mortality. Please activate Code PE by paging [phone]. 2. Sizable pleural effusion on the right with smaller pleural effusion on the left. Consolidation and compressive atelectasis throughout most of the right lower lobe. Milder atelectasis left base. Nodular opacities on the right, likely metastatic foci.3.  Stable adenopathy compared to 1 week prior.4. Enlargement of spleen, incompletely visualized but documented CT 1 week prior. Concern for potential lymphoma.  5. Aortic atherosclerosis. No  aneurysm or dissection evident. Foci of coronary artery calcification noted."  Most recent lab results (03/06/2019) of CBC is as follows: all values are WNL except for Platelets at 72, Eosinophils Absolute at 0.6,Abs Immature Granulocytes at 0.08 .  On review of systems, pt reports rashes, digestive issues and denies back pain, abdominal pain and any other symptoms.   On PMHx the pt reports Hypothyroidism, MCI, Hypercholesterolemia, Tracheal anomaly, Hypertension, Pulmonary nodule, Atherosclerosis of aorta, anxiety, gait disorder, primary insomnia.   INTERVAL HISTORY:  Patient is here for interval follow-up for reevaluation of her angioimmunoblastic T-cell lymphoma. She notes that her respiratory symptoms and cough have resolved since she took antibiotics for her pneumonia. No new lumps or bumps. No fevers no chills no night sweats no unexpected weight loss. P.o. intake and balance have improved.  Anxiety and depression are also improved since her last clinic visit. Patient denies any other acute new symptoms.  Labs done today 04/07/2021 show normal CBC, stable CMP and LDH are 210.  PET CT scan done on 03/31/2021 showedNo signs of recurrent lymphoma in the neck, chest, abdomen or pelvis.   RIGHT lung pulmonary nodules similar to recent CT, some stable since April of 2022. Parenchymal opacities have improved since previous imaging. This is compatible with a component of improving infection/inflammation. Consider three-month follow-up chest CT for the single nodule that was not present on prior imaging in the RIGHT lower lobe.   Signs of increased metabolic activity about the RIGHT shoulder, RIGHT hip and iliac crest, reportedly the patient has experienced a recent fall. If there is continued pain in these areas dedicated imaging could be performed as warranted. Areas of increased metabolic activity more likely related to  recent trauma.   MEDICAL HISTORY:  Past Medical History:   Diagnosis Date   Allergy    codeine, thiazides   Anxiety    new dx   Arthritis    Breast cancer ([facility]) 03/14/12   bx=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,ER/PR=+,upper inner quad   Cancer ([facility])    breast   Cataract    DVT (deep venous thrombosis) ([facility]) 02/2019   left leg   Dyspnea    Edema    both legs feet and toe, abdomen   Glaucoma    laser treated years ago   HOH (hard of hearing)    Hyperlipemia    Hypertension    Hypothyroidism    Pancreatic cyst    benign   PONV (postoperative nausea and vomiting)    Pulmonary embolism ([facility]) 02/2019   bilateral    Radiation 06/11/2012-07/12/2012   17 sessions 4250 cGy, 3 sessions 750 cGy   Vertigo    Wears glasses    Wears partial dentures    partial upper     SURGICAL HISTORY: Past Surgical History:  Procedure Laterality Date   ABDOMINAL HYSTERECTOMY  1966   1/2 ovary left in    [place]   lumpectomy-lt   CATARACT EXTRACTION     b/l   COLONOSCOPY     EXCISION MASS NECK Left 03/17/2019    EXCISION MASS NECK (Left Neck)   EXCISION MASS NECK Left 03/17/2019   Procedure: EXCISION MASS NECK;  Surgeon: Helayne Seminole, MD;  Location: [facility];  Service: ENT;  Laterality: Left;   EYE SURGERY Bilateral    bilateral cataract removal   IR IMAGING GUIDED PORT INSERTION  04/23/2019   JOINT REPLACEMENT  2013   rt total knee   JOINT REPLACEMENT  1995   lt total knee   PARTIAL MASTECTOMY WITH NEEDLE LOCALIZATION  04/09/2012   Procedure: PARTIAL MASTECTOMY WITH NEEDLE LOCALIZATION;  Surgeon: Adin Hector, MD;  Location: [facility];  Service: General;  Laterality: Right;   SKIN BIOPSY Left 03/17/2019   LEFT THIGH   SKIN BIOPSY Left 03/17/2019   Procedure: Skin Biopsy Left Thigh;  Surgeon: Helayne Seminole, MD;  Location: [facility];  Service: ENT;  Laterality: Left;   TONSILLECTOMY     TOTAL KNEE ARTHROPLASTY  08/16/2011   Procedure: TOTAL KNEE ARTHROPLASTY;  Surgeon: Ninetta Lights, MD;  Location: [facility];  Service: Orthopedics;  Laterality: Right;     SOCIAL HISTORY: Social History   Socioeconomic History   Marital status: Widowed    Spouse name: Not on file   Number of children: 2   Years of education: Not on file   Highest education level: Not on file  Occupational History   Occupation: Retired    Comment: Community education officer   Tobacco Use   Smoking status: Never   Smokeless tobacco: Never  Vaping Use   Vaping Use: Never used  Substance and Sexual Activity   Alcohol use: No   Drug use: No   Sexual activity: Not Currently  Other Topics Concern   Not on file  Social History Narrative   Not on file   Social Determinants of Health   Financial Resource Strain: Not on file  Food Insecurity: Not on file  Transportation Needs: Not on file  Physical Activity: Not on file  Stress: Not on file  Social Connections: Not on file  Intimate Partner Violence: Not on file     FAMILY HISTORY:  Family History  Problem Relation Age of Onset   Heart disease Father    Cancer Maternal Aunt        stomach     ALLERGIES:   is allergic to citalopram, alendronate sodium, amoxicillin-pot clavulanate, codeine, latex, lisinopril, septra [sulfamethoxazole-trimethoprim], and thiazide-type diuretics.   MEDICATIONS:  Current Outpatient Medications  Medication Sig Dispense Refill   Cholecalciferol (VITAMIN D) 50 MCG (2000 UT) tablet Take 2,000 Units by mouth 2 (two) times daily.     citalopram (CELEXA) 10 MG tablet Take 10 mg by mouth daily.     ELIQUIS 2.5 MG TABS tablet TAKE 1 TABLET TWICE A DAY 180 tablet 1   levothyroxine (SYNTHROID) 25 MCG tablet Take 25 mcg by mouth daily before breakfast.      lidocaine-prilocaine (EMLA) cream Apply to affected area once 30 g 3   LORazepam (ATIVAN) 0.5 MG tablet Take 1 tablet (0.5 mg total) by mouth 2 (two) times daily as needed for anxiety or sleep. 30 tablet 0   Multiple Vitamins-Minerals (PRESERVISION AREDS PO) Take 1  capsule by mouth 2 (two) times daily.      ondansetron (ZOFRAN) 8 MG tablet Take 1 tablet (8 mg total) by mouth 2 (two) times daily as needed. Start on the third day after chemotherapy. 30 tablet 1   polyethylene glycol (MIRALAX / GLYCOLAX) 17 g packet Take 17 g by mouth daily.     sodium chloride (MURO 128) 5 % ophthalmic ointment Place 1 application into both eyes at bedtime.     XIIDRA 5 % SOLN Place 1 drop into both eyes daily as needed (dry eyes).     zolpidem (AMBIEN) 5 MG tablet Take 5 mg by mouth at bedtime as needed for sleep.      amLODipine (NORVASC) 2.5 MG tablet Take 2.5-5 mg by mouth See admin instructions. 5 mg in the morning, 2.5 mg in the evening (Patient not taking: Reported on 01/26/2021)     clonazepam (KLONOPIN) 0.125 MG disintegrating tablet Take 1 tablet (0.125 mg total) by mouth 2 (two) times daily. (Patient not taking: Reported on 10/14/2020) 60 tablet 0   Cyanocobalamin (VITAMIN B-12 PO) Take 3,000 mcg by mouth daily.  (Patient not taking: Reported on 01/26/2021)     escitalopram (LEXAPRO) 10 MG tablet Take 1 tablet (10 mg total) by mouth daily. (Patient not taking: Reported on 04/07/2021)     gabapentin (NEURONTIN) 100 MG capsule Take 2 capsules (200 mg total) by mouth at bedtime. (Patient not taking: Reported on 10/14/2020) 60 capsule 2   levofloxacin (LEVAQUIN) 500 MG tablet Take 1 tablet (500 mg total) by mouth daily. Consider taking over-the-counter probiotics to reduce the risk of diarrhea from antibiotics. (Patient not taking: Reported on 04/07/2021) 5 tablet 0   sertraline (ZOLOFT) 25 MG tablet Take 25 mg by mouth daily. (Patient not taking: Reported on 04/07/2021)     No current facility-administered medications for this visit.     REVIEW OF SYSTEMS:   .10 Point review of Systems was done is negative except as noted above.  PHYSICAL EXAMINATION:  .BP (!) 151/80   Pulse 77   Temp (!) 97.2 F (36.2 C)   Resp 18   Wt 101 lb 14.4 oz (46.2 kg)   SpO2 99%   BMI  16.45 kg/m  . GENERAL:alert, in no acute distress and comfortable SKIN: no acute rashes, no significant lesions EYES: conjunctiva are pink and non-injected, sclera anicteric OROPHARYNX: MMM, no exudates, no oropharyngeal erythema or ulceration NECK:  supple, no JVD LYMPH:  no palpable lymphadenopathy in the cervical, axillary or inguinal regions LUNGS: clear to auscultation b/l with normal respiratory effort HEART: regular rate & rhythm ABDOMEN:  normoactive bowel sounds , non tender, not distended. Extremity: no pedal edema PSYCH: alert & oriented x 3 with fluent speech NEURO: no focal motor/sensory deficits   LABORATORY DATA:  I have reviewed the data as listed  CBC Latest Ref Rng & Units 04/07/2021 01/26/2021 12/06/2020  WBC 4.0 - 10.5 K/uL 4.7 8.8 6.2  Hemoglobin 12.0 - 15.0 g/dL 13.7 11.7(L) 13.8  Hematocrit 36.0 - 46.0 % 43.3 36.0 42.1  Platelets 150 - 400 K/uL 178 331 192    CMP Latest Ref Rng & Units 04/07/2021 01/26/2021 12/06/2020  Glucose 70 - 99 mg/dL 151(H) 119(H) 161(H)  BUN 8 - 23 mg/dL 28(H) 20 23  Creatinine 0.44 - 1.00 mg/dL 0.78 0.63 0.87  Sodium 135 - 145 mmol/L 143 143 143  Potassium 3.5 - 5.1 mmol/L 3.8 3.7 3.8  Chloride 98 - 111 mmol/L 104 106 106  CO2 22 - 32 mmol/L 29 26 29   Calcium 8.9 - 10.3 mg/dL 9.8 9.7 9.6  Total Protein 6.5 - 8.1 g/dL 6.9 6.5 6.8  Total Bilirubin 0.3 - 1.2 mg/dL 0.5 0.5 0.7  Alkaline Phos 38 - 126 U/L 82 71 78  AST 15 - 41 U/L 16 12(L) 13(L)  ALT 0 - 44 U/L 12 10 11    . Lab Results  Component Value Date   LDH 210 (H) 04/07/2021    Component     Latest Ref Rng & Units 03/27/2019  Hepatitis B Surface Ag     NON REACTIVE NON REACTIVE  Hep B Core Total Ab     NON REACTIVE NON REACTIVE  HCV Ab     NON REACTIVE NON REACTIVE  LDH     98 - 192 U/L 252 (H)   . Lab Results  Component Value Date   LDH 210 (H) 04/07/2021      04/08/2019 NM PET Image Initial (PI) Skull Base To Thigh (Accession [id number])  04/04/2019  PATHOLOGY   04/07/2019 ECHOCARDIOGRAM  03/28/2019 PATHOLOGY   03/06/2019 CT ANGIOGRAPHY CHEST WITH CONTRAST (Accession [id number])  04/07/2019 ECHOCARDIOGRAM  03/06/2019 ECHOCARDIOGRAM     03/06/2019 PORTABLE CHEST 1 VIEW (Accession [id number])    02/27/2019 CT CHEST, ABDOMEN, AND PELVIS WITH CONTRAST (Accession [id number])    02/17/2019 CHEST XRAY - 2 VIEW (Accession [id number])    RADIOGRAPHIC STUDIES: I have personally reviewed the radiological images as listed and agreed with the findings in the report. DG Elbow Complete Right  Result Date: 03/23/2021 CLINICAL DATA:  Injury, fall. EXAM: RIGHT ELBOW - COMPLETE 3+ VIEW COMPARISON:  None. FINDINGS: There is no evidence of fracture, dislocation, or joint effusion. There is no evidence of arthropathy or other focal bone abnormality. Soft tissues are unremarkable. IMPRESSION: Negative. Electronically Signed   By: Ronney Asters M.D.   On: 03/23/2021 17:14   CT Head Wo Contrast  Result Date: 03/23/2021 CLINICAL DATA:  Head trauma, mod-severe head injury on eliquis; Facial trauma, nose ecchymosis and facial injury EXAM: CT HEAD WITHOUT CONTRAST CT MAXILLOFACIAL WITHOUT CONTRAST TECHNIQUE: Multidetector CT imaging of the head and maxillofacial structures were performed using the standard protocol without intravenous contrast. Multiplanar CT image reconstructions of the maxillofacial structures were also generated. COMPARISON:  None. FINDINGS: CT HEAD FINDINGS Brain: There is no acute intracranial hemorrhage or mass effect gray-white differentiation is preserved. Patchy hypodensity in the supratentorial white  matter is nonspecific probably reflects moderate chronic microvascular ischemic changes. Prominence of the ventricles and sulci reflects parenchymal volume loss. No extra-axial collection. Vascular: No hyperdense vessel. There is intracranial atherosclerotic calcification at the skull base. Skull: Unremarkable. Other: Mastoid air cells  are clear. CT MAXILLOFACIAL FINDINGS Osseous: No acute facial fracture. Orbits: No intraorbital hematoma. Sinuses: Minor mucosal thickening. Soft tissues: Mild low frontal scalp soft tissue swelling. IMPRESSION: No evidence of acute intracranial injury.  No acute facial fracture. Electronically Signed   By: Macy Mis M.D.   On: 03/23/2021 15:36   CT Cervical Spine Wo Contrast  Result Date: 03/23/2021 CLINICAL DATA:  Fall.  Neck trauma. EXAM: CT CERVICAL SPINE WITHOUT CONTRAST TECHNIQUE: Multidetector CT imaging of the cervical spine was performed without intravenous contrast. Multiplanar CT image reconstructions were also generated. COMPARISON:  CT neck 02/27/2019. FINDINGS: Alignment: There is 2 mm of anterolisthesis at C7-T1 and T1-T2, likely degenerative. Alignment is otherwise anatomic. Skull base and vertebrae: No acute fracture. No primary bone lesion or focal pathologic process. Soft tissues and spinal canal: No prevertebral fluid or swelling. No visible canal hematoma. Right-sided central venous catheter is partially visualized. Disc levels: There is disc space narrowing and endplate osteophyte formation at C5-C6 and C6-C7 as well as C7-T1. There is left-sided neural foraminal stenosis at these levels. There is no significant central canal stenosis identified. Upper chest: There is pleuroparenchymal scarring in the lung apices. Other: None. IMPRESSION: No acute fracture or traumatic subluxation of the cervical spine. Electronically Signed   By: Ronney Asters M.D.   On: 03/23/2021 17:46   NM PET Image Restag (PS) Skull Base To Thigh  Result Date: 04/01/2021 CLINICAL DATA:  Subsequent treatment strategy for follow-up lymphoma in a 87 year old female. EXAM: NUCLEAR MEDICINE PET SKULL BASE TO THIGH TECHNIQUE: 5.19 mCi F-18 FDG was injected intravenously. Full-ring PET imaging was performed from the skull base to thigh after the radiotracer. CT data was obtained and used for attenuation correction  and anatomic localization. Fasting blood glucose: 100 mg/dl COMPARISON:  Chest CT of February 22, 2021. Previous PET CT of August 23, 2020 and multiple prior images. FINDINGS: Mediastinal blood pool activity: SUV max 1.91 Liver activity: SUV max 2.62 NECK: Persistent generalized increased metabolic activity throughout the thyroid more pronounced on the LEFT than the RIGHT uptake pattern is nodular on the LEFT. No discrete nodule visualized on the CT portion of the exam. No hypermetabolic lymph nodes in the neck. Maximum SUV in the LEFT thyroid of 6.99 as compared to 5.4 Incidental CT findings: none CHEST: No hypermetabolic mediastinal or hilar nodes. Incidental CT findings: Calcified atheromatous plaque in the thoracic aorta. 3.6 cm ascending thoracic aortic caliber. Normal caliber central pulmonary vessels. Normal heart size without substantial pericardial effusion. Three-vessel coronary artery calcification. RIGHT IJ Port-A-Cath terminates at the caval to atrial junction. Signs of RIGHT breast lumpectomy. Bandlike ground-glass and nodular parenchymal pattern in the RIGHT chest with less consolidation in the middle lobe compared to the previous CT from February 22, 2021. Small nodules in the RIGHT lower lobe largest 4 mm (image 77/4) previously approximately 5 mm. No effusion. Signs of lingular scarring with similar appearance to previous imaging. Near complete resolution of parenchymal opacity in the LEFT lung base since previous imaging. As compared to the April 4th PET CT there is a single nodule at that time in the periphery of the RIGHT lower lobe that is stable at between 4 and 5 mm. None of these areas show substantially increased  metabolic activity. ABDOMEN/PELVIS: Splenic size is top-normal and unchanged. No adenopathy in the abdomen or in the pelvis. No focal solid organ uptake. Incidental CT findings: Signs of aortic atherosclerosis and diverticular disease of the colon as before. SKELETON: No focal  hypermetabolic activity to suggest skeletal metastasis. Areas of increased metabolic activity are noted about the LEFT shoulder and RIGHT hip and along the RIGHT iliac crest without underlying discrete lesion Incidental CT findings: Spinal degenerative changes. No acute bone process on limited assessment. IMPRESSION: No signs of recurrent lymphoma in the neck, chest, abdomen or pelvis. RIGHT lung pulmonary nodules similar to recent CT, some stable since April of 2022. Parenchymal opacities have improved since previous imaging. This is compatible with a component of improving infection/inflammation. Consider three-month follow-up chest CT for the single nodule that was not present on prior imaging in the RIGHT lower lobe. Signs of increased metabolic activity about the RIGHT shoulder, RIGHT hip and iliac crest, reportedly the patient has experienced a recent fall. If there is continued pain in these areas dedicated imaging could be performed as warranted. Areas of increased metabolic activity more likely related to recent trauma. Aortic Atherosclerosis (ICD10-I70.0) and Emphysema (ICD10-J43.9). Electronically Signed   By: Zetta Bills M.D.   On: 04/01/2021 11:49   DG Hip Unilat W or Wo Pelvis 2-3 Views Right  Result Date: 03/23/2021 CLINICAL DATA:  Injury. EXAM: DG HIP (WITH OR WITHOUT PELVIS) 2-3V RIGHT COMPARISON:  Right hip x-ray 01/27/2021. FINDINGS: There is no evidence of hip fracture or dislocation. Mild degenerative changes of the right hip appear similar to the prior study. Soft tissues are within normal limits. IMPRESSION: 1. No acute bony abnormality of the right hip. Electronically Signed   By: Ronney Asters M.D.   On: 03/23/2021 17:16   CT Maxillofacial Wo Contrast  Result Date: 03/23/2021 CLINICAL DATA:  Head trauma, mod-severe head injury on eliquis; Facial trauma, nose ecchymosis and facial injury EXAM: CT HEAD WITHOUT CONTRAST CT MAXILLOFACIAL WITHOUT CONTRAST TECHNIQUE: Multidetector CT  imaging of the head and maxillofacial structures were performed using the standard protocol without intravenous contrast. Multiplanar CT image reconstructions of the maxillofacial structures were also generated. COMPARISON:  None. FINDINGS: CT HEAD FINDINGS Brain: There is no acute intracranial hemorrhage or mass effect gray-white differentiation is preserved. Patchy hypodensity in the supratentorial white matter is nonspecific probably reflects moderate chronic microvascular ischemic changes. Prominence of the ventricles and sulci reflects parenchymal volume loss. No extra-axial collection. Vascular: No hyperdense vessel. There is intracranial atherosclerotic calcification at the skull base. Skull: Unremarkable. Other: Mastoid air cells are clear. CT MAXILLOFACIAL FINDINGS Osseous: No acute facial fracture. Orbits: No intraorbital hematoma. Sinuses: Minor mucosal thickening. Soft tissues: Mild low frontal scalp soft tissue swelling. IMPRESSION: No evidence of acute intracranial injury.  No acute facial fracture. Electronically Signed   By: Macy Mis M.D.   On: 03/23/2021 15:36     ASSESSMENT & PLAN:   CAITRIONA SUNDQUIST is a 87 y.o. female with:  1. 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 SKIN, LEFT THIGH, BIOPSY: - Involvement by angioimmunoblastic T-cell lymphoma." -04/07/2019 Echocardiogram - normal EF -04/08/2019 NM PET Image Initial (PI) Skull Base To Thigh (Accession [id number]) revealed "1. Widespread hypermetabolic adenopathy in the neck, chest, and abdomen/pelvis, primarily in the Deauville 4 and Deauville 5 range. There is also hypermetabolic activity along the posterior nasopharynx in lingual tonsillar regions potentially indicating sites  of involvement. 2. The subtle hypodensities in the spine and bony pelvis are not appreciably  hypermetabolic may be incidental, surveillance is suggested. 3. Bilateral thyroid activity but especially on the left. Probably from thyroiditis. 4. A 6 mm subpleural nodule anteriorly in the right lower lobe not appreciably hypermetabolic but is below sensitive PET-CT size thresholds and merits surveillance. 5. Mildly accentuated diffuse splenic activity without splenomegaly or focal splenic lesion identified. 6. Other imaging findings of potential clinical significance: Aortic Atherosclerosis (ICD10-I70.0). Coronary atherosclerosis. Large right and small left pleural effusions. Moderate cardiomegaly. Small pericardial effusion. Mild mesenteric edema. Large left renal cyst. Fullness of both renal collecting systems with suspected nonobstructive left nephrolithiasis. Degenerative grade 1 anterolisthesis at L4-5 at L5-S1. Trace pelvic ascites." -05/28/2019 PET/CT ([id number]) revealed "Complete metabolic response to therapy". -09/16/2019 PET/CT ([id number]) revealed "1. Unfortunately evidence of lymphoma recurrence at sites of previous hypermetabolic adenopathy. Hypermetabolic lymphoid tissue is new from comparison PET-CT 05/28/2019 but at same locations as PET-CT 04/08/2019. The number of metastatic lymph nodes is less than 04/08/2019. Lymphoid tissue at the RIGHT skull base is larger. 2. Three foci of hypermetabolic recurrence are present, lymphoid tissue at the RIGHT base of skull, hypermetabolic RIGHT hilar lymph node and hypermetabolic lymph node in the porta hepatis." -11/25/2019 PET/CT ([id number]) revealed "1. Marked interval improvement in the hypermetabolic disease identified previously in the posterior right nasopharynx, compatible with Deauville 4 today. 2. Decreased hypermetabolism in the right hilar and porta hepatis lymph nodes identified previously (Deauville 3). No new sites of hypermetabolic disease in the chest, abdomen, or pelvis. 3. Similar diffuse thyroid uptake, left greater than right. 4.  No substantial change in right-sided pulmonary nodules." -04/09/2020 PET/CT ([id number]) revealed "No evidence progressive lymphoma."  2. Pulmonary Embolism -extensive likely related to extensive malignancy  3. H/o Breast Cancer  -The patient had bilateral diagnostic  mammography at [facility] 07/11/2011. This  showed some calcifications in the right  breast, which seemed a little bit more  prominent than prior. In the left breast  there was an area of possible  architectural distortion. However left  breast ultrasound the same day showed  no abnormality. 6 month followup was  suggested, and on 02/28/2012 the  patient again had bilateral diagnostic  mammography, now with right  ultrasonography. The microcalcifications  in the right breast appeared increased.  Ultrasound showed a hypoechoic lesion  measuring 5 mm, with no associated  shadowing. This had been previously  noted and appeared unchanged. Anterior  to that there was a small hypoechoic  mass measuring 4 mm in diameter.  There was felt to be suspicious, and on  03/14/2012 the patient underwent biopsy  of the right breast mass, showing ([id number]  [id number]) ductal carcinoma in situ,  intermediate grade, estrogen receptor  100% and progesterone receptor 100%  positive.    -Bilateral breast MRI was obtained  03/26/2012. This showed only post  biopsy changes in the right breast,  associated with an area of non-masslike  enhancement measuring 2.4 cm. The  left breast was unremarkable, and there  was no enlarged axillary or internal  mammary adenopathy noted.  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 The patient's  subsequent history is as detailed below.  #4 bilateral multifocal lung nodularity with bronchiectatic changes possible atypical infection. Patient had symptoms of cough which have  completely resolved after  her antibiotics [levofloxacin]  PLAN: -discussed Labs done today 04/07/2021 show normal CBC, stable CMP and LDH are 210.  PET CT scan done on 03/31/2021 showed No signs of recurrent lymphoma in the neck, chest, abdomen or pelvis.   RIGHT lung pulmonary nodules similar to recent CT, some stable since April of 2022. Parenchymal opacities have improved since previous imaging. This is compatible with a component of improving infection/inflammation. Consider three-month follow-up chest CT for the single nodule that was not present on prior imaging in the RIGHT lower lobe.   Signs of increased metabolic activity about the RIGHT shoulder, RIGHT hip and iliac crest, reportedly the patient has experienced a recent fall. If there is continued pain in these areas dedicated imaging could be performed as warranted. Areas of increased metabolic activity more likely related to recent trauma.  -Patient has no lab, clinical or radiographic evidence of progression of her angioblastic T-cell lymphoma. - RTC with Dr Irene Limbo with labs in 40months  Follow-up  PET CT scan in 5 weeks Return to clinic with Dr. Irene Limbo with port flush and labs in 6 weeks   . The total time spent in the appointment was 30 minutes and more than 50% was on counseling and direct patient cares.   All of the patient's questions were answered with apparent satisfaction. The patient knows to call the clinic with any problems, questions or concerns.    Sullivan Lone MD [place] AAHIVMS [facility] Hematology/Oncology Physician [facility]

## 2021-04-20 DIAGNOSIS — E039 Hypothyroidism, unspecified: Secondary | ICD-10-CM | POA: Diagnosis not present

## 2021-04-20 DIAGNOSIS — C865 Angioimmunoblastic T-cell lymphoma: Secondary | ICD-10-CM | POA: Diagnosis not present

## 2021-04-20 DIAGNOSIS — E7800 Pure hypercholesterolemia, unspecified: Secondary | ICD-10-CM | POA: Diagnosis not present

## 2021-04-20 DIAGNOSIS — I10 Essential (primary) hypertension: Secondary | ICD-10-CM | POA: Diagnosis not present

## 2021-04-20 DIAGNOSIS — Z0000 Encounter for general adult medical examination without abnormal findings: Secondary | ICD-10-CM | POA: Diagnosis not present

## 2021-04-20 DIAGNOSIS — I872 Venous insufficiency (chronic) (peripheral): Secondary | ICD-10-CM | POA: Diagnosis not present

## 2021-04-20 DIAGNOSIS — Z1389 Encounter for screening for other disorder: Secondary | ICD-10-CM | POA: Diagnosis not present

## 2021-04-20 DIAGNOSIS — Z23 Encounter for immunization: Secondary | ICD-10-CM | POA: Diagnosis not present

## 2021-04-20 DIAGNOSIS — I712 Thoracic aortic aneurysm, without rupture, unspecified: Secondary | ICD-10-CM | POA: Diagnosis not present

## 2021-04-20 DIAGNOSIS — G629 Polyneuropathy, unspecified: Secondary | ICD-10-CM | POA: Diagnosis not present

## 2021-04-21 ENCOUNTER — Other Ambulatory Visit: Payer: Self-pay

## 2021-04-21 ENCOUNTER — Encounter (INDEPENDENT_AMBULATORY_CARE_PROVIDER_SITE_OTHER): Payer: Medicare Other | Admitting: Ophthalmology

## 2021-04-21 DIAGNOSIS — I10 Essential (primary) hypertension: Secondary | ICD-10-CM

## 2021-04-21 DIAGNOSIS — H43813 Vitreous degeneration, bilateral: Secondary | ICD-10-CM | POA: Diagnosis not present

## 2021-04-21 DIAGNOSIS — H3554 Dystrophies primarily involving the retinal pigment epithelium: Secondary | ICD-10-CM

## 2021-04-21 DIAGNOSIS — H353132 Nonexudative age-related macular degeneration, bilateral, intermediate dry stage: Secondary | ICD-10-CM

## 2021-04-21 DIAGNOSIS — H35033 Hypertensive retinopathy, bilateral: Secondary | ICD-10-CM

## 2021-04-26 DIAGNOSIS — I714 Abdominal aortic aneurysm, without rupture, unspecified: Secondary | ICD-10-CM | POA: Diagnosis not present

## 2021-04-26 DIAGNOSIS — I10 Essential (primary) hypertension: Secondary | ICD-10-CM | POA: Diagnosis not present

## 2021-04-26 DIAGNOSIS — M810 Age-related osteoporosis without current pathological fracture: Secondary | ICD-10-CM | POA: Diagnosis not present

## 2021-04-26 DIAGNOSIS — I872 Venous insufficiency (chronic) (peripheral): Secondary | ICD-10-CM | POA: Diagnosis not present

## 2021-04-26 DIAGNOSIS — Z96651 Presence of right artificial knee joint: Secondary | ICD-10-CM | POA: Diagnosis not present

## 2021-04-26 DIAGNOSIS — C865 Angioimmunoblastic T-cell lymphoma: Secondary | ICD-10-CM | POA: Diagnosis not present

## 2021-04-26 DIAGNOSIS — Z7901 Long term (current) use of anticoagulants: Secondary | ICD-10-CM | POA: Diagnosis not present

## 2021-04-26 DIAGNOSIS — H8110 Benign paroxysmal vertigo, unspecified ear: Secondary | ICD-10-CM | POA: Diagnosis not present

## 2021-04-26 DIAGNOSIS — K5790 Diverticulosis of intestine, part unspecified, without perforation or abscess without bleeding: Secondary | ICD-10-CM | POA: Diagnosis not present

## 2021-04-26 DIAGNOSIS — Z9011 Acquired absence of right breast and nipple: Secondary | ICD-10-CM | POA: Diagnosis not present

## 2021-04-26 DIAGNOSIS — Z9181 History of falling: Secondary | ICD-10-CM | POA: Diagnosis not present

## 2021-04-26 DIAGNOSIS — H3530 Unspecified macular degeneration: Secondary | ICD-10-CM | POA: Diagnosis not present

## 2021-04-26 DIAGNOSIS — E039 Hypothyroidism, unspecified: Secondary | ICD-10-CM | POA: Diagnosis not present

## 2021-04-26 DIAGNOSIS — I712 Thoracic aortic aneurysm, without rupture, unspecified: Secondary | ICD-10-CM | POA: Diagnosis not present

## 2021-04-26 DIAGNOSIS — G629 Polyneuropathy, unspecified: Secondary | ICD-10-CM | POA: Diagnosis not present

## 2021-04-26 DIAGNOSIS — Z86718 Personal history of other venous thrombosis and embolism: Secondary | ICD-10-CM | POA: Diagnosis not present

## 2021-04-26 DIAGNOSIS — E7800 Pure hypercholesterolemia, unspecified: Secondary | ICD-10-CM | POA: Diagnosis not present

## 2021-05-04 ENCOUNTER — Encounter (HOSPITAL_BASED_OUTPATIENT_CLINIC_OR_DEPARTMENT_OTHER): Payer: Self-pay | Admitting: Emergency Medicine

## 2021-05-04 ENCOUNTER — Other Ambulatory Visit: Payer: Self-pay

## 2021-05-04 ENCOUNTER — Emergency Department (HOSPITAL_BASED_OUTPATIENT_CLINIC_OR_DEPARTMENT_OTHER)
Admission: EM | Admit: 2021-05-04 | Discharge: 2021-05-04 | Disposition: A | Discharge status: Home / Self Care | Payer: Medicare Other | Attending: Emergency Medicine | Admitting: Emergency Medicine

## 2021-05-04 DIAGNOSIS — K625 Hemorrhage of anus and rectum: Secondary | ICD-10-CM | POA: Diagnosis not present

## 2021-05-04 DIAGNOSIS — E039 Hypothyroidism, unspecified: Secondary | ICD-10-CM | POA: Insufficient documentation

## 2021-05-04 DIAGNOSIS — I10 Essential (primary) hypertension: Secondary | ICD-10-CM | POA: Diagnosis not present

## 2021-05-04 DIAGNOSIS — Z79899 Other long term (current) drug therapy: Secondary | ICD-10-CM | POA: Diagnosis not present

## 2021-05-04 DIAGNOSIS — Z91040 Latex allergy status: Secondary | ICD-10-CM | POA: Insufficient documentation

## 2021-05-04 DIAGNOSIS — Z7901 Long term (current) use of anticoagulants: Secondary | ICD-10-CM | POA: Diagnosis not present

## 2021-05-04 DIAGNOSIS — Z96653 Presence of artificial knee joint, bilateral: Secondary | ICD-10-CM | POA: Diagnosis not present

## 2021-05-04 DIAGNOSIS — Z853 Personal history of malignant neoplasm of breast: Secondary | ICD-10-CM | POA: Diagnosis not present

## 2021-05-04 LAB — CBC
HCT: 13.5 % — ABNORMAL LOW (ref 36.0–46.0)
Hemoglobin: 4.1 g/dL — CL (ref 12.0–15.0)
MCH: 29.1 pg (ref 26.0–34.0)
MCHC: 30.4 g/dL (ref 30.0–36.0)
MCV: 95.7 fL (ref 80.0–100.0)
RBC: 1.41 MIL/uL — ABNORMAL LOW (ref 3.87–5.11)
RDW: 14.3 % (ref 11.5–15.5)
WBC: 1.6 10*3/uL — ABNORMAL LOW (ref 4.0–10.5)
nRBC: 0 % (ref 0.0–0.2)

## 2021-05-04 LAB — CBC WITH DIFFERENTIAL/PLATELET
Abs Immature Granulocytes: 0.01 10*3/uL (ref 0.00–0.07)
Basophils Absolute: 0 10*3/uL (ref 0.0–0.1)
Basophils Relative: 1 %
Eosinophils Absolute: 0.1 10*3/uL (ref 0.0–0.5)
Eosinophils Relative: 2 %
HCT: 40.6 % (ref 36.0–46.0)
Hemoglobin: 12.8 g/dL (ref 12.0–15.0)
Immature Granulocytes: 0 %
Lymphocytes Relative: 17 %
Lymphs Abs: 0.8 10*3/uL (ref 0.7–4.0)
MCH: 28.8 pg (ref 26.0–34.0)
MCHC: 31.5 g/dL (ref 30.0–36.0)
MCV: 91.4 fL (ref 80.0–100.0)
Monocytes Absolute: 0.4 10*3/uL (ref 0.1–1.0)
Monocytes Relative: 9 %
Neutro Abs: 3.3 10*3/uL (ref 1.7–7.7)
Neutrophils Relative %: 71 %
Platelets: 151 10*3/uL (ref 150–400)
RBC: 4.44 MIL/uL (ref 3.87–5.11)
RDW: 14.1 % (ref 11.5–15.5)
WBC: 4.6 10*3/uL (ref 4.0–10.5)
nRBC: 0 % (ref 0.0–0.2)

## 2021-05-04 LAB — OCCULT BLOOD X 1 CARD TO LAB, STOOL: Fecal Occult Bld: NEGATIVE

## 2021-05-04 LAB — COMPREHENSIVE METABOLIC PANEL
ALT: 12 U/L (ref 0–44)
AST: 15 U/L (ref 15–41)
Albumin: 4.1 g/dL (ref 3.5–5.0)
Alkaline Phosphatase: 58 U/L (ref 38–126)
Anion gap: 8 (ref 5–15)
BUN: 24 mg/dL — ABNORMAL HIGH (ref 8–23)
CO2: 29 mmol/L (ref 22–32)
Calcium: 9.5 mg/dL (ref 8.9–10.3)
Chloride: 105 mmol/L (ref 98–111)
Creatinine, Ser: 0.56 mg/dL (ref 0.44–1.00)
GFR, Estimated: >60 mL/min (ref >60)
Glucose, Bld: 84 mg/dL (ref 70–99)
Potassium: 3.6 mmol/L (ref 3.5–5.1)
Sodium: 142 mmol/L (ref 135–145)
Total Bilirubin: 0.5 mg/dL (ref 0.3–1.2)
Total Protein: 6.4 g/dL — ABNORMAL LOW (ref 6.5–8.1)

## 2021-05-04 MED ORDER — HYDRALAZINE HCL 25 MG PO TABS
50.0000 mg | ORAL_TABLET | Freq: Once | ORAL | Status: AC
  Administered 2021-05-04: 20:00:00 50 mg via ORAL
  Filled 2021-05-04: qty 2

## 2021-05-04 MED ORDER — HYDRALAZINE HCL 25 MG PO TABS: 25.0000 mg | ORAL_TABLET | Freq: Three times a day (TID) | ORAL | 0 refills | Status: DC

## 2021-05-04 MED ORDER — HEPARIN SOD (PORK) LOCK FLUSH 100 UNIT/ML IV SOLN
500.0000 [IU] | Freq: Once | INTRAVENOUS | Status: AC
  Administered 2021-05-04: 20:00:00 500 [IU]
  Filled 2021-05-04: qty 5

## 2021-05-04 NOTE — ED Provider Notes (Signed)
[place] EMERGENCY DEPT Provider Note   CSN: [id number] Arrival date & time: 05/04/21  1547     History Chief Complaint  Patient presents with   Rectal Bleeding    Sarah [name] is a 87 y.o. female.  Patient presented chief complaint of intermittent rectal bleeding.  She states that this is been going on and off for the past month or so.  She is on Eliquis for history of DVT/PE.  When she noticed rectal bleeding she would hold the Eliquis and the rectal bleeding was resolved within 3 to 4 days and when she would resume the Eliquis after 3 to 4 days rectal bleeding return.  She is seen her doctor for this in the past and they have continued management by stopping Eliquis when she bleeds.  However she returns to the ER today because she had what she describes as a heavier episode of bleeding today.  Last episode of bleeding was approximately 1 week ago, she had restarted her Eliquis 3 days and now had an episode of rectal bleeding again today.  Otherwise denies headache or chest pain or abdominal pain.  No fever no cough no vomiting no diarrhea.      Past Medical History:  Diagnosis Date   Allergy    codeine, thiazides   Anxiety    new dx   Arthritis    Breast cancer ([place]) 03/14/12   bx=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,ER/PR=+,upper inner quad   Cancer (HCC)    breast   Cataract    DVT (deep venous thrombosis) ([place]) 02/2019   left leg   Dyspnea    Edema    both legs feet and toe, abdomen   Glaucoma    laser treated years ago   HOH (hard of hearing)    Hyperlipemia    Hypertension    Hypothyroidism    Pancreatic cyst    benign   PONV (postoperative nausea and vomiting)    Pulmonary embolism ([place]) 02/2019   bilateral    Radiation 06/11/2012-07/12/2012   17 sessions 4250 cGy, 3 sessions 750 cGy   Vertigo    Wears glasses    Wears partial dentures    partial upper    Patient Active Problem List   Diagnosis Date Noted    Neuropathy 10/27/2020   Abnormal gait 06/22/2020   Age-related osteoporosis without current pathological fracture 06/22/2020   Anxiety 06/22/2020   Hardening of the aorta (main artery of the heart) ([place]) 06/22/2020   Malignant lymphoma, non-Hodgkin's ([place]) 06/22/2020   Mild cognitive disorder 06/22/2020   Non-pressure chronic ulcer of unspecified part of unspecified lower leg with unspecified severity ([place]) 06/22/2020   Primary insomnia 06/22/2020   Pulmonary nodule 06/22/2020   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   Abnormal chest x-ray 12/31/2019   Cough 12/31/2019   Port-A-Cath in place 06/04/2019   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[place]) 04/14/2019   Counseling regarding advance care planning and goals of care 04/14/2019   Lymphadenopathy of head and neck 03/17/2019   Lymph nodes enlarged 03/17/2019   History of pulmonary embolus (PE) 03/17/2019   Acute pulmonary embolism ([place]) 03/06/2019   Diffuse lymphadenopathy 03/06/2019   Pleural effusion 03/06/2019   Pruritus 02/06/2019   Pruritic rash 02/06/2019   Angio-edema 02/06/2019   Shortness of breath 02/06/2019   Osteopenia 12/18/2013   Heart block AV first degree 12/18/2013   Breast cancer of upper-inner quadrant of right female breast ([place]) 03/19/2012   Right knee DJD 08/18/2011   Hypothyroidism  Hypertension    PONV (postoperative nausea and vomiting)    Hyperlipemia     Past Surgical History:  Procedure Laterality Date   ABDOMINAL HYSTERECTOMY  1966   1/2 ovary left in    [place]   lumpectomy-lt   CATARACT EXTRACTION     b/l   COLONOSCOPY     EXCISION MASS NECK Left 03/17/2019    EXCISION MASS NECK (Left Neck)   EXCISION MASS NECK Left 03/17/2019   Procedure: EXCISION MASS NECK;  Surgeon: Helayne Seminole, MD;  Location: [facility];  Service: ENT;  Laterality: Left;   EYE SURGERY Bilateral    bilateral cataract removal   IR IMAGING GUIDED PORT INSERTION  04/23/2019   JOINT REPLACEMENT  2013   rt total knee    JOINT REPLACEMENT  1995   lt total knee   PARTIAL MASTECTOMY WITH NEEDLE LOCALIZATION  04/09/2012   Procedure: PARTIAL MASTECTOMY WITH NEEDLE LOCALIZATION;  Surgeon: Adin Hector, MD;  Location: [facility];  Service: General;  Laterality: Right;   SKIN BIOPSY Left 03/17/2019   LEFT THIGH   SKIN BIOPSY Left 03/17/2019   Procedure: Skin Biopsy Left Thigh;  Surgeon: Helayne Seminole, MD;  Location: [facility];  Service: ENT;  Laterality: Left;   TONSILLECTOMY     TOTAL KNEE ARTHROPLASTY  08/16/2011   Procedure: TOTAL KNEE ARTHROPLASTY;  Surgeon: Ninetta Lights, MD;  Location: [facility];  Service: Orthopedics;  Laterality: Right;     OB History   No obstetric history on file.     Family History  Problem Relation Age of Onset   Heart disease Father    Cancer Maternal Aunt        stomach    Social History   Tobacco Use   Smoking status: Never   Smokeless tobacco: Never  Vaping Use   Vaping Use: Never used  Substance Use Topics   Alcohol use: No   Drug use: No    Home Medications Prior to Admission medications   Medication Sig Start Date End Date Taking? Authorizing Provider  hydrALAZINE (APRESOLINE) 25 MG tablet Take 1 tablet (25 mg total) by mouth 3 (three) times daily. 05/04/21 06/03/21 Yes Luna Fuse, MD  amLODipine (NORVASC) 2.5 MG tablet Take 2.5-5 mg by mouth See admin instructions. 5 mg in the morning, 2.5 mg in the evening Patient not taking: Reported on 01/26/2021 12/21/18   [provider]  Cholecalciferol (VITAMIN D) 50 MCG (2000 UT) tablet Take 2,000 Units by mouth 2 (two) times daily.    [provider]  citalopram (CELEXA) 10 MG tablet Take 10 mg by mouth daily. 03/01/21   [provider]  clonazepam (KLONOPIN) 0.125 MG disintegrating tablet Take 1 tablet (0.125 mg total) by mouth 2 (two) times daily. Patient not taking: Reported on 10/14/2020 03/23/20   Brunetta Genera, MD  Cyanocobalamin (VITAMIN B-12 PO) Take 3,000  mcg by mouth daily.  Patient not taking: Reported on 01/26/2021    [provider]  ELIQUIS 2.5 MG TABS tablet TAKE 1 TABLET TWICE A DAY 03/28/21   Brunetta Genera, MD  escitalopram (LEXAPRO) 10 MG tablet Take 1 tablet (10 mg total) by mouth daily. Patient not taking: Reported on 04/07/2021 02/25/21   Brunetta Genera, MD  gabapentin (NEURONTIN) 100 MG capsule Take 2 capsules (200 mg total) by mouth at bedtime. Patient not taking: Reported on 10/14/2020 09/29/20   Brunetta Genera, MD  levofloxacin [facility])  500 MG tablet Take 1 tablet (500 mg total) by mouth daily. Consider taking over-the-counter probiotics to reduce the risk of diarrhea from antibiotics. Patient not taking: Reported on 04/07/2021 02/03/21   Brunetta Genera, MD  levothyroxine (SYNTHROID) 25 MCG tablet Take 25 mcg by mouth daily before breakfast.  09/20/18   [provider]  lidocaine-prilocaine (EMLA) cream Apply to affected area once 04/13/20   Brunetta Genera, MD  LORazepam (ATIVAN) 0.5 MG tablet Take 1 tablet (0.5 mg total) by mouth 2 (two) times daily as needed for anxiety or sleep. 12/16/20   Brunetta Genera, MD  Multiple Vitamins-Minerals (PRESERVISION AREDS PO) Take 1 capsule by mouth 2 (two) times daily.     [provider]  ondansetron (ZOFRAN) 8 MG tablet Take 1 tablet (8 mg total) by mouth 2 (two) times daily as needed. Start on the third day after chemotherapy. 04/14/19   Brunetta Genera, MD  polyethylene glycol (MIRALAX / GLYCOLAX) 17 g packet Take 17 g by mouth daily.    [provider]  sertraline (ZOLOFT) 25 MG tablet Take 25 mg by mouth daily. Patient not taking: Reported on 04/07/2021 10/06/20   [provider]  sodium chloride (MURO 128) 5 % ophthalmic ointment Place 1 application into both eyes at bedtime.    [provider]  XIIDRA 5 % SOLN Place 1 drop into both eyes daily as needed (dry eyes). 10/22/19   [provider]   zolpidem (AMBIEN) 5 MG tablet Take 5 mg by mouth at bedtime as needed for sleep.  12/07/19   [provider]    Allergies    Citalopram, Alendronate sodium, Amoxicillin-pot clavulanate, Codeine, Latex, Lisinopril, Septra [sulfamethoxazole-trimethoprim], and Thiazide-type diuretics  Review of Systems   Review of Systems  Constitutional:  Negative for fever.  HENT:  Negative for ear pain.   Eyes:  Negative for pain.  Respiratory:  Negative for cough.   Cardiovascular:  Negative for chest pain.  Gastrointestinal:  Negative for abdominal pain.  Genitourinary:  Negative for flank pain.  Musculoskeletal:  Negative for back pain.  Skin:  Negative for rash.  Neurological:  Negative for headaches.   Physical Exam Updated Vital Signs BP (!) 183/110    Pulse 70    Temp (!) 97 F (36.1 C)    Resp 14    SpO2 98%   Physical Exam Constitutional:      General: She is not in acute distress.    Appearance: Normal appearance.  HENT:     Head: Normocephalic.     Nose: Nose normal.  Eyes:     Extraocular Movements: Extraocular movements intact.  Cardiovascular:     Rate and Rhythm: Normal rate.  Pulmonary:     Effort: Pulmonary effort is normal.  Musculoskeletal:        General: Normal range of motion.     Cervical back: Normal range of motion.  Neurological:     General: No focal deficit present.     Mental Status: She is alert. Mental status is at baseline.    ED Results / Procedures / Treatments   Labs (all labs ordered are listed, but only abnormal results are displayed) Labs Reviewed  CBC - Abnormal; Notable for the following components:      Result Value   WBC 1.6 (*)    RBC 1.41 (*)    Hemoglobin 4.1 (*)    HCT 13.5 (*)    All other components within normal limits  COMPREHENSIVE METABOLIC PANEL - Abnormal; Notable for the following components:   BUN 24 (*)    Total Protein 6.4 (*)    All other components within normal limits  OCCULT BLOOD X 1 CARD TO LAB, STOOL   CBC WITH DIFFERENTIAL/PLATELET    EKG None  Radiology No results found.  Procedures Procedures   Medications Ordered in ED Medications  hydrALAZINE (APRESOLINE) tablet 50 mg (50 mg Oral Given 05/04/21 1939)    ED Course  I have reviewed the triage vital signs and the nursing notes.  Pertinent labs & imaging results that were available during my care of the patient were reviewed by me and considered in my medical decision making (see chart for details).    MDM Rules/Calculators/A&P                           Rectal exam is normal color stool is guaiac negative.  No bleeding noted.  She does have some skin tags with evidence of external hemorrhoids.  No thrombosed hemorrhoid or tenderness noted.  Labs show normal hemoglobin.  Initial sample was taken from her Chemo-Port and per lab was a bad sample with abnormal values.  Repeat blood test showed normal hemoglobin levels.  Patient advised outpatient follow-up with GI and primary care doctor within the week.  Recommended return for recurrent or heavy bleeding or any additional concerns.  Blood pressure was noted to be elevated given hydralazine with improvement.  Discharged home stable condition.  Final Clinical Impression(s) / ED Diagnoses Final diagnoses:  Rectal bleeding  Hypertension, unspecified type    Rx / DC Orders ED Discharge Orders          Ordered    hydrALAZINE (APRESOLINE) 25 MG tablet  3 times daily        05/04/21 1943             Luna Fuse, MD 05/04/21 1943

## 2021-05-04 NOTE — ED Triage Notes (Signed)
Reports rectal bleeding, states sent by PCP. Takes eliquis.

## 2021-05-04 NOTE — Discharge Instructions (Signed)
Call your primary care doctor or specialist as discussed in the next 2-3 days.   Return immediately back to the ER if:  Your symptoms worsen within the next 12-24 hours. You develop new symptoms such as new fevers, persistent vomiting, new pain, shortness of breath, or new weakness or numbness, or if you have any other concerns.  

## 2021-05-04 NOTE — ED Notes (Signed)
Blood work redrawn

## 2021-05-14 ENCOUNTER — Inpatient Hospital Stay (HOSPITAL_BASED_OUTPATIENT_CLINIC_OR_DEPARTMENT_OTHER)
Admission: EM | Admit: 2021-05-14 | Discharge: 2021-05-17 | DRG: 070 | Disposition: A | Discharge status: Home Health | Payer: Medicare Other | Attending: Internal Medicine | Admitting: Internal Medicine

## 2021-05-14 ENCOUNTER — Other Ambulatory Visit: Payer: Self-pay

## 2021-05-14 ENCOUNTER — Emergency Department (HOSPITAL_BASED_OUTPATIENT_CLINIC_OR_DEPARTMENT_OTHER): Payer: Medicare Other

## 2021-05-14 ENCOUNTER — Encounter (HOSPITAL_BASED_OUTPATIENT_CLINIC_OR_DEPARTMENT_OTHER): Payer: Self-pay

## 2021-05-14 DIAGNOSIS — Z7901 Long term (current) use of anticoagulants: Secondary | ICD-10-CM | POA: Diagnosis not present

## 2021-05-14 DIAGNOSIS — Z888 Allergy status to other drugs, medicaments and biological substances status: Secondary | ICD-10-CM | POA: Diagnosis not present

## 2021-05-14 DIAGNOSIS — Z86718 Personal history of other venous thrombosis and embolism: Secondary | ICD-10-CM | POA: Diagnosis not present

## 2021-05-14 DIAGNOSIS — R509 Fever, unspecified: Secondary | ICD-10-CM | POA: Diagnosis not present

## 2021-05-14 DIAGNOSIS — I6523 Occlusion and stenosis of bilateral carotid arteries: Secondary | ICD-10-CM | POA: Diagnosis not present

## 2021-05-14 DIAGNOSIS — Z923 Personal history of irradiation: Secondary | ICD-10-CM

## 2021-05-14 DIAGNOSIS — I161 Hypertensive emergency: Secondary | ICD-10-CM | POA: Diagnosis not present

## 2021-05-14 DIAGNOSIS — Z853 Personal history of malignant neoplasm of breast: Secondary | ICD-10-CM | POA: Diagnosis not present

## 2021-05-14 DIAGNOSIS — I6783 Posterior reversible encephalopathy syndrome: Principal | ICD-10-CM | POA: Diagnosis present

## 2021-05-14 DIAGNOSIS — Z79899 Other long term (current) drug therapy: Secondary | ICD-10-CM | POA: Diagnosis not present

## 2021-05-14 DIAGNOSIS — G44201 Tension-type headache, unspecified, intractable: Secondary | ICD-10-CM | POA: Diagnosis not present

## 2021-05-14 DIAGNOSIS — Z8572 Personal history of non-Hodgkin lymphomas: Secondary | ICD-10-CM

## 2021-05-14 DIAGNOSIS — E7800 Pure hypercholesterolemia, unspecified: Secondary | ICD-10-CM | POA: Diagnosis not present

## 2021-05-14 DIAGNOSIS — Z20822 Contact with and (suspected) exposure to covid-19: Secondary | ICD-10-CM | POA: Diagnosis present

## 2021-05-14 DIAGNOSIS — Z9841 Cataract extraction status, right eye: Secondary | ICD-10-CM

## 2021-05-14 DIAGNOSIS — Z885 Allergy status to narcotic agent status: Secondary | ICD-10-CM

## 2021-05-14 DIAGNOSIS — Z79890 Hormone replacement therapy (postmenopausal): Secondary | ICD-10-CM | POA: Diagnosis not present

## 2021-05-14 DIAGNOSIS — E785 Hyperlipidemia, unspecified: Secondary | ICD-10-CM | POA: Diagnosis present

## 2021-05-14 DIAGNOSIS — Z91040 Latex allergy status: Secondary | ICD-10-CM

## 2021-05-14 DIAGNOSIS — R410 Disorientation, unspecified: Secondary | ICD-10-CM | POA: Diagnosis not present

## 2021-05-14 DIAGNOSIS — Z66 Do not resuscitate: Secondary | ICD-10-CM | POA: Diagnosis present

## 2021-05-14 DIAGNOSIS — Z96651 Presence of right artificial knee joint: Secondary | ICD-10-CM | POA: Diagnosis present

## 2021-05-14 DIAGNOSIS — I6349 Cerebral infarction due to embolism of other cerebral artery: Secondary | ICD-10-CM | POA: Diagnosis not present

## 2021-05-14 DIAGNOSIS — R29700 NIHSS score 0: Secondary | ICD-10-CM | POA: Diagnosis present

## 2021-05-14 DIAGNOSIS — R519 Headache, unspecified: Secondary | ICD-10-CM

## 2021-05-14 DIAGNOSIS — I10 Essential (primary) hypertension: Secondary | ICD-10-CM | POA: Diagnosis present

## 2021-05-14 DIAGNOSIS — I6389 Other cerebral infarction: Secondary | ICD-10-CM | POA: Diagnosis not present

## 2021-05-14 DIAGNOSIS — Z9842 Cataract extraction status, left eye: Secondary | ICD-10-CM | POA: Diagnosis not present

## 2021-05-14 DIAGNOSIS — I671 Cerebral aneurysm, nonruptured: Secondary | ICD-10-CM | POA: Diagnosis not present

## 2021-05-14 DIAGNOSIS — Z8249 Family history of ischemic heart disease and other diseases of the circulatory system: Secondary | ICD-10-CM | POA: Diagnosis not present

## 2021-05-14 DIAGNOSIS — G9349 Other encephalopathy: Secondary | ICD-10-CM | POA: Diagnosis not present

## 2021-05-14 DIAGNOSIS — I639 Cerebral infarction, unspecified: Secondary | ICD-10-CM | POA: Diagnosis not present

## 2021-05-14 DIAGNOSIS — E876 Hypokalemia: Secondary | ICD-10-CM | POA: Diagnosis present

## 2021-05-14 DIAGNOSIS — Z808 Family history of malignant neoplasm of other organs or systems: Secondary | ICD-10-CM

## 2021-05-14 DIAGNOSIS — F419 Anxiety disorder, unspecified: Secondary | ICD-10-CM | POA: Diagnosis present

## 2021-05-14 DIAGNOSIS — I6330 Cerebral infarction due to thrombosis of unspecified cerebral artery: Secondary | ICD-10-CM | POA: Insufficient documentation

## 2021-05-14 DIAGNOSIS — R110 Nausea: Secondary | ICD-10-CM | POA: Diagnosis not present

## 2021-05-14 DIAGNOSIS — G9340 Encephalopathy, unspecified: Secondary | ICD-10-CM | POA: Diagnosis not present

## 2021-05-14 LAB — URINALYSIS, ROUTINE W REFLEX MICROSCOPIC
Bilirubin Urine: NEGATIVE
Glucose, UA: NEGATIVE mg/dL
Hgb urine dipstick: NEGATIVE
Ketones, ur: NEGATIVE mg/dL
Nitrite: NEGATIVE
Specific Gravity, Urine: 1.013 (ref 1.005–1.030)
pH: 7 (ref 5.0–8.0)

## 2021-05-14 LAB — CBC WITH DIFFERENTIAL/PLATELET
Abs Immature Granulocytes: 0.01 10*3/uL (ref 0.00–0.07)
Basophils Absolute: 0 10*3/uL (ref 0.0–0.1)
Basophils Relative: 1 %
Eosinophils Absolute: 0.1 10*3/uL (ref 0.0–0.5)
Eosinophils Relative: 2 %
HCT: 44.7 % (ref 36.0–46.0)
Hemoglobin: 14.3 g/dL (ref 12.0–15.0)
Immature Granulocytes: 0 %
Lymphocytes Relative: 17 %
Lymphs Abs: 0.8 10*3/uL (ref 0.7–4.0)
MCH: 29.4 pg (ref 26.0–34.0)
MCHC: 32 g/dL (ref 30.0–36.0)
MCV: 91.8 fL (ref 80.0–100.0)
Monocytes Absolute: 0.5 10*3/uL (ref 0.1–1.0)
Monocytes Relative: 10 %
Neutro Abs: 3.2 10*3/uL (ref 1.7–7.7)
Neutrophils Relative %: 70 %
Platelets: 157 10*3/uL (ref 150–400)
RBC: 4.87 MIL/uL (ref 3.87–5.11)
RDW: 13.8 % (ref 11.5–15.5)
WBC: 4.5 10*3/uL (ref 4.0–10.5)
nRBC: 0 % (ref 0.0–0.2)

## 2021-05-14 LAB — BASIC METABOLIC PANEL
Anion gap: 8 (ref 5–15)
BUN: 24 mg/dL — ABNORMAL HIGH (ref 8–23)
CO2: 30 mmol/L (ref 22–32)
Calcium: 9.8 mg/dL (ref 8.9–10.3)
Chloride: 105 mmol/L (ref 98–111)
Creatinine, Ser: 0.57 mg/dL (ref 0.44–1.00)
GFR, Estimated: >60 mL/min (ref >60)
Glucose, Bld: 88 mg/dL (ref 70–99)
Potassium: 3.7 mmol/L (ref 3.5–5.1)
Sodium: 143 mmol/L (ref 135–145)

## 2021-05-14 MED ORDER — ACETAMINOPHEN 325 MG PO TABS
650.0000 mg | ORAL_TABLET | Freq: Once | ORAL | Status: AC
  Administered 2021-05-14: 650 mg via ORAL
  Filled 2021-05-14: qty 2

## 2021-05-14 MED ORDER — PROCHLORPERAZINE EDISYLATE 10 MG/2ML IJ SOLN
10.0000 mg | Freq: Once | INTRAMUSCULAR | Status: AC
  Administered 2021-05-14: 19:00:00 10 mg via INTRAVENOUS
  Filled 2021-05-14: qty 2

## 2021-05-14 MED ORDER — DEXAMETHASONE SODIUM PHOSPHATE 10 MG/ML IJ SOLN
8.0000 mg | Freq: Once | INTRAMUSCULAR | Status: AC
  Administered 2021-05-15: 8 mg via INTRAVENOUS
  Filled 2021-05-14: qty 1

## 2021-05-14 MED ORDER — IOHEXOL 350 MG/ML SOLN
100.0000 mL | Freq: Once | INTRAVENOUS | Status: AC | PRN
  Administered 2021-05-14: 22:00:00 100 mL via INTRAVENOUS

## 2021-05-14 MED ORDER — DROPERIDOL 2.5 MG/ML IJ SOLN
0.6250 mg | Freq: Once | INTRAMUSCULAR | Status: AC
  Administered 2021-05-15: 0.625 mg via INTRAVENOUS
  Filled 2021-05-14: qty 2

## 2021-05-14 MED ORDER — HYDRALAZINE HCL 20 MG/ML IJ SOLN
10.0000 mg | Freq: Once | INTRAMUSCULAR | Status: AC
  Administered 2021-05-14: 23:00:00 10 mg via INTRAVENOUS
  Filled 2021-05-14: qty 1

## 2021-05-14 MED ORDER — DIPHENHYDRAMINE HCL 50 MG/ML IJ SOLN
25.0000 mg | Freq: Once | INTRAMUSCULAR | Status: AC
  Administered 2021-05-14: 19:00:00 25 mg via INTRAVENOUS
  Filled 2021-05-14: qty 1

## 2021-05-14 NOTE — ED Triage Notes (Signed)
Since noon, headache throughout head. Pt c/o pain behind her eyes.  Pt's BP was running 656C-127N systolic at home. Pt had hydralazine at 1500.   Stroke screen negative in triage.

## 2021-05-14 NOTE — ED Provider Notes (Incomplete Revision)
[facility] EMERGENCY DEPT Provider Note   CSN: [id number] Arrival date & time: 05/14/21  1728     History Chief Complaint  Patient presents with   Headache    Sarah [name] is a 87 y.o. female who presents the emergency department for evaluation of a headache.  Patient states that approximately noon today she had acute onset headache that she describes as bilateral frontal radiating to the back of the head, 10 out of 10 worst headache of life.  There was associated nausea but no vomiting.  No associated numbness, tingling, weakness or other neurologic complaints.  Patient currently denies chest pain, shortness of breath, abdominal pain, nausea, vomiting or other systemic symptoms.  Patient states that she has had elevated blood pressures today and her daughter gave the patient her home blood pressure medications at approximately 230 this evening with blood pressures ranging between 150 and 170.   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,ER/PR=+,upper inner quad   Cancer (HCC)    breast   Cataract    DVT (deep venous thrombosis) ([place]) 02/2019   left leg   Dyspnea    Edema    both legs feet and toe, abdomen   Glaucoma    laser treated years ago   HOH (hard of hearing)    Hyperlipemia    Hypertension    Hypothyroidism    Pancreatic cyst    benign   PONV (postoperative nausea and vomiting)    Pulmonary embolism ([place]) 02/2019   bilateral    Radiation 06/11/2012-07/12/2012   17 sessions 4250 cGy, 3 sessions 750 cGy   Vertigo    Wears glasses    Wears partial dentures    partial upper    Patient Active Problem  List   Diagnosis Date Noted   Neuropathy 10/27/2020   Abnormal gait 06/22/2020   Age-related osteoporosis without current pathological fracture 06/22/2020   Anxiety 06/22/2020   Hardening of the aorta (main artery of the heart) ([place]) 06/22/2020   Malignant lymphoma, non-Hodgkin's ([place]) 06/22/2020   Mild cognitive disorder 06/22/2020   Non-pressure chronic ulcer of unspecified part of unspecified lower leg with unspecified severity ([place]) 06/22/2020   Primary insomnia 06/22/2020   Pulmonary nodule 06/22/2020   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   Abnormal chest x-ray 12/31/2019   Cough 12/31/2019   Port-A-Cath in place 06/04/2019   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   Counseling regarding advance care planning and goals of care 04/14/2019   Lymphadenopathy of head and neck 03/17/2019   Lymph nodes enlarged 03/17/2019   History of pulmonary embolus (PE) 03/17/2019   Acute pulmonary embolism ([place]) 03/06/2019   Diffuse lymphadenopathy 03/06/2019   Pleural effusion 03/06/2019   Pruritus 02/06/2019   Pruritic rash 02/06/2019   Angio-edema 02/06/2019   Shortness of breath 02/06/2019   Osteopenia 12/18/2013   Heart block AV first degree 12/18/2013   Breast cancer of upper-inner quadrant of right female breast ([place]) 03/19/2012   Right knee DJD 08/18/2011   Hypothyroidism    Hypertension    PONV (postoperative nausea and vomiting)    Hyperlipemia  Past Surgical History:  Procedure Laterality Date   ABDOMINAL HYSTERECTOMY  1966   1/2 ovary left in    [place]   lumpectomy-lt   CATARACT EXTRACTION     b/l   COLONOSCOPY     EXCISION MASS NECK Left 03/17/2019    EXCISION MASS NECK (Left Neck)   EXCISION MASS NECK Left 03/17/2019   Procedure: EXCISION MASS NECK;  Surgeon: Helayne Seminole, MD;  Location: [facility];  Service: ENT;  Laterality: Left;   EYE SURGERY Bilateral    bilateral cataract removal   IR IMAGING GUIDED PORT INSERTION  04/23/2019   JOINT  REPLACEMENT  2013   rt total knee   JOINT REPLACEMENT  1995   lt total knee   PARTIAL MASTECTOMY WITH NEEDLE LOCALIZATION  04/09/2012   Procedure: PARTIAL MASTECTOMY WITH NEEDLE LOCALIZATION;  Surgeon: Adin Hector, MD;  Location: [facility];  Service: General;  Laterality: Right;   SKIN BIOPSY Left 03/17/2019   LEFT THIGH   SKIN BIOPSY Left 03/17/2019   Procedure: Skin Biopsy Left Thigh;  Surgeon: Helayne Seminole, MD;  Location: [facility];  Service: ENT;  Laterality: Left;   TONSILLECTOMY     TOTAL KNEE ARTHROPLASTY  08/16/2011   Procedure: TOTAL KNEE ARTHROPLASTY;  Surgeon: Ninetta Lights, MD;  Location: [facility];  Service: Orthopedics;  Laterality: Right;     OB History   No obstetric history on file.     Family History  Problem Relation Age of Onset   Heart disease Father    Cancer Maternal Aunt        stomach    Social History   Tobacco Use   Smoking status: Never   Smokeless tobacco: Never  Vaping Use   Vaping Use: Never used  Substance Use Topics   Alcohol use: No   Drug use: No    Home Medications Prior to Admission medications   Medication Sig Start Date End Date Taking? Authorizing Provider  amLODipine (NORVASC) 2.5 MG tablet Take 2.5-5 mg by mouth See admin instructions. 5 mg in the morning, 2.5 mg in the evening Patient not taking: Reported on 01/26/2021 12/21/18   [provider]  Cholecalciferol (VITAMIN D) 50 MCG (2000 UT) tablet Take 2,000 Units by mouth 2 (two) times daily.    [provider]  citalopram (CELEXA) 10 MG tablet Take 10 mg by mouth daily. 03/01/21   [provider]  clonazepam (KLONOPIN) 0.125 MG disintegrating tablet Take 1 tablet (0.125 mg total) by mouth 2 (two) times daily. Patient not taking: Reported on 10/14/2020 03/23/20   Brunetta Genera, MD  Cyanocobalamin (VITAMIN B-12 PO) Take 3,000 mcg by mouth daily.  Patient not taking: Reported on 01/26/2021    [provider]  ELIQUIS  2.5 MG TABS tablet TAKE 1 TABLET TWICE A DAY 03/28/21   Brunetta Genera, MD  escitalopram (LEXAPRO) 10 MG tablet Take 1 tablet (10 mg total) by mouth daily. Patient not taking: Reported on 04/07/2021 02/25/21   Brunetta Genera, MD  gabapentin (NEURONTIN) 100 MG capsule Take 2 capsules (200 mg total) by mouth at bedtime. Patient not taking: Reported on 10/14/2020 09/29/20   Brunetta Genera, MD  hydrALAZINE (APRESOLINE) 25 MG tablet Take 1 tablet (25 mg total) by mouth 3 (three) times daily. 05/04/21 06/03/21  Luna Fuse, MD  levofloxacin (LEVAQUIN) 500 MG tablet Take 1 tablet (500 mg total) by mouth daily. Consider taking over-the-counter probiotics to  reduce the risk of diarrhea from antibiotics. Patient not taking: Reported on 04/07/2021 02/03/21   Brunetta Genera, MD  levothyroxine (SYNTHROID) 25 MCG tablet Take 25 mcg by mouth daily before breakfast.  09/20/18   [provider]  lidocaine-prilocaine (EMLA) cream Apply to affected area once 04/13/20   Brunetta Genera, MD  LORazepam (ATIVAN) 0.5 MG tablet Take 1 tablet (0.5 mg total) by mouth 2 (two) times daily as needed for anxiety or sleep. 12/16/20   Brunetta Genera, MD  Multiple Vitamins-Minerals (PRESERVISION AREDS PO) Take 1 capsule by mouth 2 (two) times daily.     [provider]  ondansetron (ZOFRAN) 8 MG tablet Take 1 tablet (8 mg total) by mouth 2 (two) times daily as needed. Start on the third day after chemotherapy. 04/14/19   Brunetta Genera, MD  polyethylene glycol (MIRALAX / GLYCOLAX) 17 g packet Take 17 g by mouth daily.    [provider]  sertraline (ZOLOFT) 25 MG tablet Take 25 mg by mouth daily. Patient not taking: Reported on 04/07/2021 10/06/20   [provider]  sodium chloride (MURO 128) 5 % ophthalmic ointment Place 1 application into both eyes at bedtime.    [provider]  XIIDRA 5 % SOLN Place 1 drop into both eyes daily as needed (dry eyes).  10/22/19   [provider]  zolpidem (AMBIEN) 5 MG tablet Take 5 mg by mouth at bedtime as needed for sleep.  12/07/19   [provider]    Allergies    Citalopram, Alendronate sodium, Amoxicillin-pot clavulanate, Codeine, Latex, Lisinopril, Septra [sulfamethoxazole-trimethoprim], and Thiazide-type diuretics  Review of Systems   Review of Systems  Constitutional:  Negative for chills and fever.  HENT:  Negative for ear pain and sore throat.   Eyes:  Negative for pain and visual disturbance.  Respiratory:  Negative for cough and shortness of breath.   Cardiovascular:  Negative for chest pain and palpitations.  Gastrointestinal:  Positive for nausea. Negative for abdominal pain and vomiting.  Genitourinary:  Negative for dysuria and hematuria.  Musculoskeletal:  Negative for arthralgias and back pain.  Skin:  Negative for color change and rash.  Neurological:  Positive for headaches. Negative for seizures and syncope.  All other systems reviewed and are negative.  Physical Exam Updated Vital Signs BP (!) 196/116 (BP Location: Right Arm)    Pulse 63    Temp (!) 97.5 F (36.4 C)    Resp 16    Ht 5\' 4"  (1.626 m)    Wt 45.4 kg    SpO2 98%    BMI 17.16 kg/m   Physical Exam Vitals and nursing note reviewed.  Constitutional:      General: She is not in acute distress.    Appearance: She is well-developed.  HENT:     Head: Normocephalic and atraumatic.  Eyes:     Conjunctiva/sclera: Conjunctivae normal.  Cardiovascular:     Rate and Rhythm: Normal rate and regular rhythm.     Heart sounds: No murmur heard. Pulmonary:     Effort: Pulmonary effort is normal. No respiratory distress.     Breath sounds: Normal breath sounds.  Abdominal:     Palpations: Abdomen is soft.     Tenderness: There is no abdominal tenderness.  Musculoskeletal:        General: No swelling.     Cervical back: Neck supple.  Skin:    General: Skin is warm and dry.     Capillary  Refill:  Capillary refill takes less than 2 seconds.  Neurological:     Mental Status: She is alert.     Cranial Nerves: No cranial nerve deficit.     Sensory: No sensory deficit.     Motor: No weakness.  Psychiatric:        Mood and Affect: Mood normal.    ED Results / Procedures / Treatments   Labs (all labs ordered are listed, but only abnormal results are displayed) Labs Reviewed  BASIC METABOLIC PANEL - Abnormal; Notable for the following components:      Result Value   BUN 24 (*)    All other components within normal limits  URINALYSIS, ROUTINE W REFLEX MICROSCOPIC - Abnormal; Notable for the following components:   Protein, ur TRACE (*)    Leukocytes,Ua TRACE (*)    Bacteria, UA RARE (*)    All other components within normal limits  CBC WITH DIFFERENTIAL/PLATELET    EKG None  Radiology CT ANGIO HEAD NECK W WO CM  Result Date: 05/14/2021 CLINICAL DATA:  Thunderclap headache EXAM: CT ANGIOGRAPHY HEAD AND NECK TECHNIQUE: Multidetector CT imaging of the head and neck was performed using the standard protocol during bolus administration of intravenous contrast. Multiplanar CT image reconstructions and MIPs were obtained to evaluate the vascular anatomy. Carotid stenosis measurements (when applicable) are obtained utilizing NASCET criteria, using the distal internal carotid diameter as the denominator. CONTRAST:  128mL OMNIPAQUE IOHEXOL 350 MG/ML SOLN COMPARISON:  Head CT same day FINDINGS: CTA NECK FINDINGS SKELETON: There is no bony spinal canal stenosis. No lytic or blastic lesion. OTHER NECK: Normal pharynx, larynx and major salivary glands. No cervical lymphadenopathy. Unremarkable thyroid gland. UPPER CHEST: Biapical scarring. Mild tree-in-bud opacity at the posterior right upper lobe AORTIC ARCH: There is calcific atherosclerosis of the aortic arch. There is no aneurysm, dissection or hemodynamically significant stenosis of the visualized portion of the aorta. Normal 3 vessel aortic  branching pattern. The visualized proximal subclavian arteries are widely patent. RIGHT CAROTID SYSTEM: No dissection, occlusion or aneurysm. Minimal atherosclerotic calcification at the carotid bifurcation without hemodynamically significant stenosis. LEFT CAROTID SYSTEM: No dissection, occlusion or aneurysm. Minimal atherosclerotic calcification at the carotid bifurcation without hemodynamically significant stenosis. VERTEBRAL ARTERIES: Left dominant configuration. Both origins are clearly patent. There is no dissection, occlusion or flow-limiting stenosis to the skull base (V1-V3 segments). CTA HEAD FINDINGS POSTERIOR CIRCULATION: --Vertebral arteries: Normal V4 segments. --Inferior cerebellar arteries: Normal. --Basilar artery: Normal. --Superior cerebellar arteries: Normal. --Posterior cerebral arteries (PCA): Normal. ANTERIOR CIRCULATION: --Intracranial internal carotid arteries: There is a 2 mm aneurysm projecting inferiorly and medially from the clinoid segment of the right internal carotid artery (series 7, image 110 and series 10, image 134). The internal carotid arteries at the skull base are otherwise unremarkable. --Anterior cerebral arteries (ACA): Normal. Both A1 segments are present. Patent anterior communicating artery (a-comm). --Middle cerebral arteries (MCA): Normal. VENOUS SINUSES: As permitted by contrast timing, patent. ANATOMIC VARIANTS: None Review of the MIP images confirms the above findings. IMPRESSION: 1. No emergent large vessel occlusion or high-grade stenosis of the intracranial arteries. 2. There is a 2 mm aneurysm projecting inferiorly and medially from the clinoid segment of the right internal carotid artery. 3. Mild tree-in-bud opacity at the posterior right upper lobe could indicate endobronchial infection. Aortic Atherosclerosis (ICD10-I70.0). Electronically Signed   By: Ulyses Jarred M.D.   On: 05/14/2021 22:10   CT Head Wo Contrast  Result Date: 05/14/2021 CLINICAL DATA:   Provided history: Headache,  new or worsening. Additional history provided: Patient reports pain behind eyes, elevated blood pressure. EXAM: CT HEAD WITHOUT CONTRAST TECHNIQUE: Contiguous axial images were obtained from the base of the skull through the vertex without intravenous contrast. COMPARISON:  Structures 03/23/2021. FINDINGS: Brain: Mild generalized cerebral and cerebellar atrophy. At least moderate patchy and confluent hypoattenuation within the cerebral white matter, nonspecific but compatible with chronic small vessel ischemic disease. There is no acute intracranial hemorrhage. No demarcated cortical infarct. No extra-axial fluid collection. No evidence of an intracranial mass. No midline shift. Vascular: No hyperdense vessel. Atherosclerotic calcifications. Skull: Normal. Negative for fracture or focal lesion. Sinuses/Orbits: Visualized orbits show no acute finding. No significant paranasal sinus disease at the imaged levels. Redemonstrated fairly symmetric discoid/polypoid soft tissue within the olfactory clefts of the nasal cavities. IMPRESSION: No evidence of acute intracranial abnormality. At least moderate chronic small vessel ischemic changes within the cerebral white matter. Mild generalized parenchymal atrophy. Fairly symmetric discoid/polypoid soft tissue within the olfactory clefts of the nasal cavities. These may reflect polyps or sinonasal respiratory epithelial adenomatoid hamartoma (REAH). Electronically Signed   By: Kellie Simmering D.O.   On: 05/14/2021 20:01    Procedures Procedures   Medications Ordered in ED Medications  prochlorperazine (COMPAZINE) injection 10 mg (10 mg Intravenous Given 05/14/21 1920)  diphenhydrAMINE (BENADRYL) injection 25 mg (25 mg Intravenous Given 05/14/21 1920)  iohexol (OMNIPAQUE) 350 MG/ML injection 100 mL (100 mLs Intravenous Contrast Given 05/14/21 2140)  hydrALAZINE (APRESOLINE) injection 10 mg (10 mg Intravenous Given 05/14/21 2314)    ED Course   I have reviewed the triage vital signs and the nursing notes.  Pertinent labs & imaging results that were available during my care of the patient were reviewed by me and considered in my medical decision making (see chart for details).    MDM Rules/Calculators/A&P                          Patient seen the emergency department for evaluation of a headache.  Physical exam is unremarkable with no focal motor or sensory deficits.  No cranial nerve deficits.  Laboratory evaluation unremarkable.  Patient received a headache cocktail which led to total resolution of her symptoms.  CT head with no evidence of intracranial bleed or subarachnoid hemorrhage.  As the patient is just outside of the 6-hour window, a CT angio was obtained that shows a new 2 mm right ICA aneurysm.  I spoke with neurosurgery who states that in the setting of the patient's headache being completely resolved with a headache cocktail and a negative CT head, the chance of spontaneous rupture of a 2 mm aneurysm is extremely low at the patient is safe for discharge with outpatient neurology follow-up.  On reassessment,    Final Clinical Impression(s) / ED Diagnoses Final diagnoses:  Nonintractable headache, unspecified chronicity pattern, unspecified headache type  Brain aneurysm    Rx / DC Orders ED Discharge Orders     None        Lynde Ludwig, Debe Coder, MD 05/14/21 [phone]

## 2021-05-14 NOTE — ED Provider Notes (Addendum)
[facility] EMERGENCY DEPT Provider Note   CSN: [id number] Arrival date & time: 05/14/21  1728     History Chief Complaint  Patient presents with   Headache    Sarah [name] is a 87 y.o. female who presents the emergency department for evaluation of a headache.  Patient states that approximately noon today she had acute onset headache that she describes as bilateral frontal radiating to the back of the head, 10 out of 10 worst headache of life.  There was associated nausea but no vomiting.  No associated numbness, tingling, weakness or other neurologic complaints.  Patient currently denies chest pain, shortness of breath, abdominal pain, nausea, vomiting or other systemic symptoms.  Patient states that she has had elevated blood pressures today and her daughter gave the patient her home blood pressure medications at approximately 230 this evening with blood pressures ranging between 150 and 170.   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,ER/PR=+,upper inner quad   Cancer (HCC)    breast   Cataract    DVT (deep venous thrombosis) ([place]) 02/2019   left leg   Dyspnea    Edema    both legs feet and toe, abdomen   Glaucoma    laser treated years ago   HOH (hard of hearing)    Hyperlipemia    Hypertension    Hypothyroidism    Pancreatic cyst    benign   PONV (postoperative nausea and vomiting)    Pulmonary embolism ([place]) 02/2019   bilateral    Radiation 06/11/2012-07/12/2012   17 sessions 4250 cGy, 3 sessions 750 cGy   Vertigo    Wears glasses    Wears partial dentures    partial upper    Patient Active Problem  List   Diagnosis Date Noted   Neuropathy 10/27/2020   Abnormal gait 06/22/2020   Age-related osteoporosis without current pathological fracture 06/22/2020   Anxiety 06/22/2020   Hardening of the aorta (main artery of the heart) ([place]) 06/22/2020   Malignant lymphoma, non-Hodgkin's ([place]) 06/22/2020   Mild cognitive disorder 06/22/2020   Non-pressure chronic ulcer of unspecified part of unspecified lower leg with unspecified severity ([place]) 06/22/2020   Primary insomnia 06/22/2020   Pulmonary nodule 06/22/2020   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   Abnormal chest x-ray 12/31/2019   Cough 12/31/2019   Port-A-Cath in place 06/04/2019   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   Counseling regarding advance care planning and goals of care 04/14/2019   Lymphadenopathy of head and neck 03/17/2019   Lymph nodes enlarged 03/17/2019   History of pulmonary embolus (PE) 03/17/2019   Acute pulmonary embolism ([place]) 03/06/2019   Diffuse lymphadenopathy 03/06/2019   Pleural effusion 03/06/2019   Pruritus 02/06/2019   Pruritic rash 02/06/2019   Angio-edema 02/06/2019   Shortness of breath 02/06/2019   Osteopenia 12/18/2013   Heart block AV first degree 12/18/2013   Breast cancer of upper-inner quadrant of right female breast ([place]) 03/19/2012   Right knee DJD 08/18/2011   Hypothyroidism    Hypertension    PONV (postoperative nausea and vomiting)    Hyperlipemia  Past Surgical History:  Procedure Laterality Date   ABDOMINAL HYSTERECTOMY  1966   1/2 ovary left in    [place]   lumpectomy-lt   CATARACT EXTRACTION     b/l   COLONOSCOPY     EXCISION MASS NECK Left 03/17/2019    EXCISION MASS NECK (Left Neck)   EXCISION MASS NECK Left 03/17/2019   Procedure: EXCISION MASS NECK;  Surgeon: Helayne Seminole, MD;  Location: [facility];  Service: ENT;  Laterality: Left;   EYE SURGERY Bilateral    bilateral cataract removal   IR IMAGING GUIDED PORT INSERTION  04/23/2019   JOINT  REPLACEMENT  2013   rt total knee   JOINT REPLACEMENT  1995   lt total knee   PARTIAL MASTECTOMY WITH NEEDLE LOCALIZATION  04/09/2012   Procedure: PARTIAL MASTECTOMY WITH NEEDLE LOCALIZATION;  Surgeon: Adin Hector, MD;  Location: [facility];  Service: General;  Laterality: Right;   SKIN BIOPSY Left 03/17/2019   LEFT THIGH   SKIN BIOPSY Left 03/17/2019   Procedure: Skin Biopsy Left Thigh;  Surgeon: Helayne Seminole, MD;  Location: [facility];  Service: ENT;  Laterality: Left;   TONSILLECTOMY     TOTAL KNEE ARTHROPLASTY  08/16/2011   Procedure: TOTAL KNEE ARTHROPLASTY;  Surgeon: Ninetta Lights, MD;  Location: [facility];  Service: Orthopedics;  Laterality: Right;     OB History   No obstetric history on file.     Family History  Problem Relation Age of Onset   Heart disease Father    Cancer Maternal Aunt        stomach    Social History   Tobacco Use   Smoking status: Never   Smokeless tobacco: Never  Vaping Use   Vaping Use: Never used  Substance Use Topics   Alcohol use: No   Drug use: No    Home Medications Prior to Admission medications   Medication Sig Start Date End Date Taking? Authorizing Provider  amLODipine (NORVASC) 2.5 MG tablet Take 2.5-5 mg by mouth See admin instructions. 5 mg in the morning, 2.5 mg in the evening Patient not taking: Reported on 01/26/2021 12/21/18   [provider]  Cholecalciferol (VITAMIN D) 50 MCG (2000 UT) tablet Take 2,000 Units by mouth 2 (two) times daily.    [provider]  citalopram (CELEXA) 10 MG tablet Take 10 mg by mouth daily. 03/01/21   [provider]  clonazepam (KLONOPIN) 0.125 MG disintegrating tablet Take 1 tablet (0.125 mg total) by mouth 2 (two) times daily. Patient not taking: Reported on 10/14/2020 03/23/20   Brunetta Genera, MD  Cyanocobalamin (VITAMIN B-12 PO) Take 3,000 mcg by mouth daily.  Patient not taking: Reported on 01/26/2021    [provider]  ELIQUIS  2.5 MG TABS tablet TAKE 1 TABLET TWICE A DAY 03/28/21   Brunetta Genera, MD  escitalopram (LEXAPRO) 10 MG tablet Take 1 tablet (10 mg total) by mouth daily. Patient not taking: Reported on 04/07/2021 02/25/21   Brunetta Genera, MD  gabapentin (NEURONTIN) 100 MG capsule Take 2 capsules (200 mg total) by mouth at bedtime. Patient not taking: Reported on 10/14/2020 09/29/20   Brunetta Genera, MD  hydrALAZINE (APRESOLINE) 25 MG tablet Take 1 tablet (25 mg total) by mouth 3 (three) times daily. 05/04/21 06/03/21  Luna Fuse, MD  levofloxacin (LEVAQUIN) 500 MG tablet Take 1 tablet (500 mg total) by mouth daily. Consider taking over-the-counter probiotics to  reduce the risk of diarrhea from antibiotics. Patient not taking: Reported on 04/07/2021 02/03/21   Brunetta Genera, MD  levothyroxine (SYNTHROID) 25 MCG tablet Take 25 mcg by mouth daily before breakfast.  09/20/18   [provider]  lidocaine-prilocaine (EMLA) cream Apply to affected area once 04/13/20   Brunetta Genera, MD  LORazepam (ATIVAN) 0.5 MG tablet Take 1 tablet (0.5 mg total) by mouth 2 (two) times daily as needed for anxiety or sleep. 12/16/20   Brunetta Genera, MD  Multiple Vitamins-Minerals (PRESERVISION AREDS PO) Take 1 capsule by mouth 2 (two) times daily.     [provider]  ondansetron (ZOFRAN) 8 MG tablet Take 1 tablet (8 mg total) by mouth 2 (two) times daily as needed. Start on the third day after chemotherapy. 04/14/19   Brunetta Genera, MD  polyethylene glycol (MIRALAX / GLYCOLAX) 17 g packet Take 17 g by mouth daily.    [provider]  sertraline (ZOLOFT) 25 MG tablet Take 25 mg by mouth daily. Patient not taking: Reported on 04/07/2021 10/06/20   [provider]  sodium chloride (MURO 128) 5 % ophthalmic ointment Place 1 application into both eyes at bedtime.    [provider]  XIIDRA 5 % SOLN Place 1 drop into both eyes daily as needed (dry eyes).  10/22/19   [provider]  zolpidem (AMBIEN) 5 MG tablet Take 5 mg by mouth at bedtime as needed for sleep.  12/07/19   [provider]    Allergies    Citalopram, Alendronate sodium, Amoxicillin-pot clavulanate, Codeine, Latex, Lisinopril, Septra [sulfamethoxazole-trimethoprim], and Thiazide-type diuretics  Review of Systems   Review of Systems  Constitutional:  Negative for chills and fever.  HENT:  Negative for ear pain and sore throat.   Eyes:  Negative for pain and visual disturbance.  Respiratory:  Negative for cough and shortness of breath.   Cardiovascular:  Negative for chest pain and palpitations.  Gastrointestinal:  Positive for nausea. Negative for abdominal pain and vomiting.  Genitourinary:  Negative for dysuria and hematuria.  Musculoskeletal:  Negative for arthralgias and back pain.  Skin:  Negative for color change and rash.  Neurological:  Positive for headaches. Negative for seizures and syncope.  All other systems reviewed and are negative.  Physical Exam Updated Vital Signs BP (!) 196/116 (BP Location: Right Arm)    Pulse 63    Temp (!) 97.5 F (36.4 C)    Resp 16    Ht 5\' 4"  (1.626 m)    Wt 45.4 kg    SpO2 98%    BMI 17.16 kg/m   Physical Exam Vitals and nursing note reviewed.  Constitutional:      General: She is not in acute distress.    Appearance: She is well-developed.  HENT:     Head: Normocephalic and atraumatic.  Eyes:     Conjunctiva/sclera: Conjunctivae normal.  Cardiovascular:     Rate and Rhythm: Normal rate and regular rhythm.     Heart sounds: No murmur heard. Pulmonary:     Effort: Pulmonary effort is normal. No respiratory distress.     Breath sounds: Normal breath sounds.  Abdominal:     Palpations: Abdomen is soft.     Tenderness: There is no abdominal tenderness.  Musculoskeletal:        General: No swelling.     Cervical back: Neck supple.  Skin:    General: Skin is warm and dry.     Capillary  Refill:  Capillary refill takes less than 2 seconds.  Neurological:     Mental Status: She is alert.     Cranial Nerves: No cranial nerve deficit.     Sensory: No sensory deficit.     Motor: No weakness.  Psychiatric:        Mood and Affect: Mood normal.    ED Results / Procedures / Treatments   Labs (all labs ordered are listed, but only abnormal results are displayed) Labs Reviewed  BASIC METABOLIC PANEL - Abnormal; Notable for the following components:      Result Value   BUN 24 (*)    All other components within normal limits  URINALYSIS, ROUTINE W REFLEX MICROSCOPIC - Abnormal; Notable for the following components:   Protein, ur TRACE (*)    Leukocytes,Ua TRACE (*)    Bacteria, UA RARE (*)    All other components within normal limits  CBC WITH DIFFERENTIAL/PLATELET    EKG None  Radiology CT ANGIO HEAD NECK W WO CM  Result Date: 05/14/2021 CLINICAL DATA:  Thunderclap headache EXAM: CT ANGIOGRAPHY HEAD AND NECK TECHNIQUE: Multidetector CT imaging of the head and neck was performed using the standard protocol during bolus administration of intravenous contrast. Multiplanar CT image reconstructions and MIPs were obtained to evaluate the vascular anatomy. Carotid stenosis measurements (when applicable) are obtained utilizing NASCET criteria, using the distal internal carotid diameter as the denominator. CONTRAST:  129mL OMNIPAQUE IOHEXOL 350 MG/ML SOLN COMPARISON:  Head CT same day FINDINGS: CTA NECK FINDINGS SKELETON: There is no bony spinal canal stenosis. No lytic or blastic lesion. OTHER NECK: Normal pharynx, larynx and major salivary glands. No cervical lymphadenopathy. Unremarkable thyroid gland. UPPER CHEST: Biapical scarring. Mild tree-in-bud opacity at the posterior right upper lobe AORTIC ARCH: There is calcific atherosclerosis of the aortic arch. There is no aneurysm, dissection or hemodynamically significant stenosis of the visualized portion of the aorta. Normal 3 vessel aortic  branching pattern. The visualized proximal subclavian arteries are widely patent. RIGHT CAROTID SYSTEM: No dissection, occlusion or aneurysm. Minimal atherosclerotic calcification at the carotid bifurcation without hemodynamically significant stenosis. LEFT CAROTID SYSTEM: No dissection, occlusion or aneurysm. Minimal atherosclerotic calcification at the carotid bifurcation without hemodynamically significant stenosis. VERTEBRAL ARTERIES: Left dominant configuration. Both origins are clearly patent. There is no dissection, occlusion or flow-limiting stenosis to the skull base (V1-V3 segments). CTA HEAD FINDINGS POSTERIOR CIRCULATION: --Vertebral arteries: Normal V4 segments. --Inferior cerebellar arteries: Normal. --Basilar artery: Normal. --Superior cerebellar arteries: Normal. --Posterior cerebral arteries (PCA): Normal. ANTERIOR CIRCULATION: --Intracranial internal carotid arteries: There is a 2 mm aneurysm projecting inferiorly and medially from the clinoid segment of the right internal carotid artery (series 7, image 110 and series 10, image 134). The internal carotid arteries at the skull base are otherwise unremarkable. --Anterior cerebral arteries (ACA): Normal. Both A1 segments are present. Patent anterior communicating artery (a-comm). --Middle cerebral arteries (MCA): Normal. VENOUS SINUSES: As permitted by contrast timing, patent. ANATOMIC VARIANTS: None Review of the MIP images confirms the above findings. IMPRESSION: 1. No emergent large vessel occlusion or high-grade stenosis of the intracranial arteries. 2. There is a 2 mm aneurysm projecting inferiorly and medially from the clinoid segment of the right internal carotid artery. 3. Mild tree-in-bud opacity at the posterior right upper lobe could indicate endobronchial infection. Aortic Atherosclerosis (ICD10-I70.0). Electronically Signed   By: Ulyses Jarred M.D.   On: 05/14/2021 22:10   CT Head Wo Contrast  Result Date: 05/14/2021 CLINICAL DATA:   Provided history: Headache,  new or worsening. Additional history provided: Patient reports pain behind eyes, elevated blood pressure. EXAM: CT HEAD WITHOUT CONTRAST TECHNIQUE: Contiguous axial images were obtained from the base of the skull through the vertex without intravenous contrast. COMPARISON:  Structures 03/23/2021. FINDINGS: Brain: Mild generalized cerebral and cerebellar atrophy. At least moderate patchy and confluent hypoattenuation within the cerebral white matter, nonspecific but compatible with chronic small vessel ischemic disease. There is no acute intracranial hemorrhage. No demarcated cortical infarct. No extra-axial fluid collection. No evidence of an intracranial mass. No midline shift. Vascular: No hyperdense vessel. Atherosclerotic calcifications. Skull: Normal. Negative for fracture or focal lesion. Sinuses/Orbits: Visualized orbits show no acute finding. No significant paranasal sinus disease at the imaged levels. Redemonstrated fairly symmetric discoid/polypoid soft tissue within the olfactory clefts of the nasal cavities. IMPRESSION: No evidence of acute intracranial abnormality. At least moderate chronic small vessel ischemic changes within the cerebral white matter. Mild generalized parenchymal atrophy. Fairly symmetric discoid/polypoid soft tissue within the olfactory clefts of the nasal cavities. These may reflect polyps or sinonasal respiratory epithelial adenomatoid hamartoma (REAH). Electronically Signed   By: Kellie Simmering D.O.   On: 05/14/2021 20:01    Procedures Procedures   Medications Ordered in ED Medications  prochlorperazine (COMPAZINE) injection 10 mg (10 mg Intravenous Given 05/14/21 1920)  diphenhydrAMINE (BENADRYL) injection 25 mg (25 mg Intravenous Given 05/14/21 1920)  iohexol (OMNIPAQUE) 350 MG/ML injection 100 mL (100 mLs Intravenous Contrast Given 05/14/21 2140)  hydrALAZINE (APRESOLINE) injection 10 mg (10 mg Intravenous Given 05/14/21 2314)    ED Course   I have reviewed the triage vital signs and the nursing notes.  Pertinent labs & imaging results that were available during my care of the patient were reviewed by me and considered in my medical decision making (see chart for details).    MDM Rules/Calculators/A&P                          Patient seen the emergency department for evaluation of a headache.  Physical exam is unremarkable with no focal motor or sensory deficits.  No cranial nerve deficits.  Laboratory evaluation unremarkable.  Patient received a headache cocktail which led to total resolution of her symptoms.  CT head with no evidence of intracranial bleed or subarachnoid hemorrhage.  As the patient is just outside of the 6-hour window, a CT angio was obtained that shows a new 2 mm right ICA aneurysm.  I spoke with neurosurgery who states that in the setting of the patient's headache being completely resolved with a headache cocktail and a negative CT head, the chance of spontaneous rupture of a 2 mm aneurysm is extremely low at the patient is safe for discharge with outpatient neurosurgery follow-up.  However, on reassessment, patient states headache has returned, and thus the patient was given droperidol and steroids as second line treatment.  I spoke again with neurosurgery who states that if her headache truly has recurred and she will require additional evaluation for subarachnoid hemorrhage either LP or MRI.  If the studies are negative then she will be worked up as a regular intractable headache.  The patient and her family member are refusing LP at this time and are requesting MRI.  Thus the patient will be transferred ED to ED to [facility] for MRI.   Final Clinical Impression(s) / ED Diagnoses Final diagnoses:  Nonintractable headache, unspecified chronicity pattern, unspecified headache type  Brain aneurysm    Rx / DC Orders  ED Discharge Orders     None        Kayslee Furey, Debe Coder, MD 05/14/21 [id number]    Teressa Lower,  MD 05/15/21 [phone]

## 2021-05-14 NOTE — ED Notes (Signed)
Consult for neuro surgery placed

## 2021-05-15 ENCOUNTER — Encounter (HOSPITAL_COMMUNITY): Payer: Self-pay | Admitting: Internal Medicine

## 2021-05-15 ENCOUNTER — Emergency Department (HOSPITAL_COMMUNITY): Payer: Medicare Other

## 2021-05-15 DIAGNOSIS — G9340 Encephalopathy, unspecified: Secondary | ICD-10-CM | POA: Diagnosis not present

## 2021-05-15 DIAGNOSIS — G44201 Tension-type headache, unspecified, intractable: Secondary | ICD-10-CM

## 2021-05-15 DIAGNOSIS — Z885 Allergy status to narcotic agent status: Secondary | ICD-10-CM | POA: Diagnosis not present

## 2021-05-15 DIAGNOSIS — R519 Headache, unspecified: Secondary | ICD-10-CM

## 2021-05-15 DIAGNOSIS — I6349 Cerebral infarction due to embolism of other cerebral artery: Secondary | ICD-10-CM | POA: Diagnosis not present

## 2021-05-15 DIAGNOSIS — I6783 Posterior reversible encephalopathy syndrome: Secondary | ICD-10-CM | POA: Diagnosis present

## 2021-05-15 DIAGNOSIS — Z96651 Presence of right artificial knee joint: Secondary | ICD-10-CM | POA: Diagnosis present

## 2021-05-15 DIAGNOSIS — G9349 Other encephalopathy: Secondary | ICD-10-CM | POA: Diagnosis present

## 2021-05-15 DIAGNOSIS — I6389 Other cerebral infarction: Secondary | ICD-10-CM | POA: Diagnosis not present

## 2021-05-15 DIAGNOSIS — Z8572 Personal history of non-Hodgkin lymphomas: Secondary | ICD-10-CM | POA: Diagnosis not present

## 2021-05-15 DIAGNOSIS — F419 Anxiety disorder, unspecified: Secondary | ICD-10-CM | POA: Diagnosis present

## 2021-05-15 DIAGNOSIS — R410 Disorientation, unspecified: Secondary | ICD-10-CM | POA: Diagnosis not present

## 2021-05-15 DIAGNOSIS — Z923 Personal history of irradiation: Secondary | ICD-10-CM | POA: Diagnosis not present

## 2021-05-15 DIAGNOSIS — I10 Essential (primary) hypertension: Secondary | ICD-10-CM | POA: Diagnosis present

## 2021-05-15 DIAGNOSIS — E785 Hyperlipidemia, unspecified: Secondary | ICD-10-CM | POA: Diagnosis present

## 2021-05-15 DIAGNOSIS — Z66 Do not resuscitate: Secondary | ICD-10-CM | POA: Diagnosis present

## 2021-05-15 DIAGNOSIS — Z8249 Family history of ischemic heart disease and other diseases of the circulatory system: Secondary | ICD-10-CM | POA: Diagnosis not present

## 2021-05-15 DIAGNOSIS — E876 Hypokalemia: Secondary | ICD-10-CM | POA: Diagnosis present

## 2021-05-15 DIAGNOSIS — Z79890 Hormone replacement therapy (postmenopausal): Secondary | ICD-10-CM | POA: Diagnosis not present

## 2021-05-15 DIAGNOSIS — I161 Hypertensive emergency: Secondary | ICD-10-CM

## 2021-05-15 DIAGNOSIS — R509 Fever, unspecified: Secondary | ICD-10-CM | POA: Diagnosis not present

## 2021-05-15 DIAGNOSIS — Z7901 Long term (current) use of anticoagulants: Secondary | ICD-10-CM | POA: Diagnosis not present

## 2021-05-15 DIAGNOSIS — I639 Cerebral infarction, unspecified: Secondary | ICD-10-CM | POA: Diagnosis present

## 2021-05-15 DIAGNOSIS — I6330 Cerebral infarction due to thrombosis of unspecified cerebral artery: Secondary | ICD-10-CM | POA: Diagnosis not present

## 2021-05-15 DIAGNOSIS — Z91040 Latex allergy status: Secondary | ICD-10-CM | POA: Diagnosis not present

## 2021-05-15 DIAGNOSIS — Z20822 Contact with and (suspected) exposure to covid-19: Secondary | ICD-10-CM | POA: Diagnosis present

## 2021-05-15 DIAGNOSIS — Z9841 Cataract extraction status, right eye: Secondary | ICD-10-CM | POA: Diagnosis not present

## 2021-05-15 DIAGNOSIS — Z86718 Personal history of other venous thrombosis and embolism: Secondary | ICD-10-CM | POA: Diagnosis not present

## 2021-05-15 DIAGNOSIS — Z9842 Cataract extraction status, left eye: Secondary | ICD-10-CM | POA: Diagnosis not present

## 2021-05-15 DIAGNOSIS — E7800 Pure hypercholesterolemia, unspecified: Secondary | ICD-10-CM | POA: Diagnosis not present

## 2021-05-15 DIAGNOSIS — Z853 Personal history of malignant neoplasm of breast: Secondary | ICD-10-CM | POA: Diagnosis not present

## 2021-05-15 DIAGNOSIS — I671 Cerebral aneurysm, nonruptured: Secondary | ICD-10-CM

## 2021-05-15 DIAGNOSIS — Z79899 Other long term (current) drug therapy: Secondary | ICD-10-CM | POA: Diagnosis not present

## 2021-05-15 DIAGNOSIS — R110 Nausea: Secondary | ICD-10-CM | POA: Diagnosis not present

## 2021-05-15 DIAGNOSIS — Z888 Allergy status to other drugs, medicaments and biological substances status: Secondary | ICD-10-CM | POA: Diagnosis not present

## 2021-05-15 LAB — RESP PANEL BY RT-PCR (FLU A&B, COVID) ARPGX2
Influenza A by PCR: NEGATIVE
Influenza B by PCR: NEGATIVE
SARS Coronavirus 2 by RT PCR: NEGATIVE

## 2021-05-15 MED ORDER — HYDRALAZINE HCL 50 MG PO TABS
50.0000 mg | ORAL_TABLET | Freq: Three times a day (TID) | ORAL | Status: DC
  Administered 2021-05-15 – 2021-05-17 (×5): 50 mg via ORAL
  Filled 2021-05-15 (×5): qty 1

## 2021-05-15 MED ORDER — LIDOCAINE-PRILOCAINE 2.5-2.5 % EX CREA: 1.0000 "application " | TOPICAL_CREAM | CUTANEOUS | Status: DC | PRN

## 2021-05-15 MED ORDER — SODIUM CHLORIDE 0.9 % IV SOLN: 250.0000 mL | INTRAVENOUS | Status: DC | PRN

## 2021-05-15 MED ORDER — LEVOTHYROXINE SODIUM 25 MCG PO TABS
25.0000 ug | ORAL_TABLET | Freq: Every day | ORAL | Status: DC
  Administered 2021-05-15 – 2021-05-17 (×3): 25 ug via ORAL
  Filled 2021-05-15 (×3): qty 1

## 2021-05-15 MED ORDER — SODIUM CHLORIDE 0.9% FLUSH: 3.0000 mL | INTRAVENOUS | Status: DC | PRN

## 2021-05-15 MED ORDER — HYDRALAZINE HCL 20 MG/ML IJ SOLN: 10.0000 mg | Freq: Four times a day (QID) | INTRAMUSCULAR | Status: DC | PRN

## 2021-05-15 MED ORDER — ACETAMINOPHEN 325 MG PO TABS: 650.0000 mg | ORAL_TABLET | Freq: Four times a day (QID) | ORAL | Status: DC | PRN

## 2021-05-15 MED ORDER — HYDRALAZINE HCL 25 MG PO TABS
25.0000 mg | ORAL_TABLET | Freq: Two times a day (BID) | ORAL | Status: DC
  Administered 2021-05-15: 13:00:00 25 mg via ORAL
  Filled 2021-05-15: qty 1

## 2021-05-15 MED ORDER — IOHEXOL 350 MG/ML SOLN
75.0000 mL | Freq: Once | INTRAVENOUS | Status: AC | PRN
  Administered 2021-05-15: 08:00:00 75 mL via INTRAVENOUS

## 2021-05-15 MED ORDER — CARVEDILOL 6.25 MG PO TABS
6.2500 mg | ORAL_TABLET | Freq: Two times a day (BID) | ORAL | Status: DC
  Administered 2021-05-15 – 2021-05-16 (×4): 6.25 mg via ORAL
  Filled 2021-05-15: qty 1
  Filled 2021-05-15: qty 2
  Filled 2021-05-15: qty 1
  Filled 2021-05-15: qty 2

## 2021-05-15 MED ORDER — CARVEDILOL 3.125 MG PO TABS: 6.2500 mg | ORAL_TABLET | Freq: Two times a day (BID) | ORAL | Status: DC

## 2021-05-15 MED ORDER — LORAZEPAM 2 MG/ML IJ SOLN: 1.0000 mg | Freq: Once | INTRAMUSCULAR | Status: DC | PRN

## 2021-05-15 MED ORDER — POLYETHYL GLYCOL-PROPYL GLYCOL 0.4-0.3 % OP GEL
1.0000 "application " | Freq: Every day | OPHTHALMIC | Status: DC | PRN
  Filled 2021-05-15: qty 10

## 2021-05-15 MED ORDER — PROSIGHT PO TABS
1.0000 | ORAL_TABLET | Freq: Two times a day (BID) | ORAL | Status: DC
  Administered 2021-05-15 – 2021-05-17 (×4): 1 via ORAL
  Filled 2021-05-15 (×4): qty 1

## 2021-05-15 MED ORDER — ONDANSETRON HCL 4 MG PO TABS: 4.0000 mg | ORAL_TABLET | Freq: Four times a day (QID) | ORAL | Status: DC | PRN

## 2021-05-15 MED ORDER — LEVOTHYROXINE SODIUM 25 MCG PO TABS: 25.0000 ug | ORAL_TABLET | Freq: Every day | ORAL | Status: DC

## 2021-05-15 MED ORDER — LABETALOL HCL 5 MG/ML IV SOLN
5.0000 mg | Freq: Once | INTRAVENOUS | Status: AC
  Administered 2021-05-15: 08:00:00 5 mg via INTRAVENOUS
  Filled 2021-05-15: qty 4

## 2021-05-15 MED ORDER — HYDRALAZINE HCL 20 MG/ML IJ SOLN
10.0000 mg | Freq: Once | INTRAMUSCULAR | Status: DC
  Filled 2021-05-15: qty 1

## 2021-05-15 MED ORDER — VITAMIN D 25 MCG (1000 UNIT) PO TABS
2000.0000 [IU] | ORAL_TABLET | Freq: Two times a day (BID) | ORAL | Status: DC
  Administered 2021-05-15 – 2021-05-17 (×5): 2000 [IU] via ORAL
  Filled 2021-05-15 (×5): qty 2

## 2021-05-15 MED ORDER — ZOLPIDEM TARTRATE 5 MG PO TABS
5.0000 mg | ORAL_TABLET | Freq: Every evening | ORAL | Status: DC | PRN
  Administered 2021-05-15 – 2021-05-16 (×2): 5 mg via ORAL
  Filled 2021-05-15 (×2): qty 1

## 2021-05-15 MED ORDER — POLYETHYLENE GLYCOL 3350 17 G PO PACK: 17.0000 g | PACK | Freq: Every day | ORAL | Status: DC | PRN

## 2021-05-15 MED ORDER — CHLORHEXIDINE GLUCONATE CLOTH 2 % EX PADS
6.0000 | MEDICATED_PAD | Freq: Every day | CUTANEOUS | Status: DC
  Administered 2021-05-16 – 2021-05-17 (×2): 6 via TOPICAL

## 2021-05-15 MED ORDER — CITALOPRAM HYDROBROMIDE 10 MG PO TABS
5.0000 mg | ORAL_TABLET | Freq: Every day | ORAL | Status: DC
  Administered 2021-05-15 – 2021-05-17 (×3): 5 mg via ORAL
  Filled 2021-05-15 (×4): qty 1

## 2021-05-15 MED ORDER — LABETALOL HCL 5 MG/ML IV SOLN
10.0000 mg | Freq: Once | INTRAVENOUS | Status: AC
  Administered 2021-05-15: 10:00:00 10 mg via INTRAVENOUS
  Filled 2021-05-15: qty 4

## 2021-05-15 MED ORDER — ACETAMINOPHEN 650 MG RE SUPP: 650.0000 mg | Freq: Four times a day (QID) | RECTAL | Status: DC | PRN

## 2021-05-15 MED ORDER — PRESERVISION AREDS PO CAPS
ORAL_CAPSULE | Freq: Two times a day (BID) | ORAL | Status: DC
Start: 2021-05-15 — End: 2021-05-15

## 2021-05-15 MED ORDER — LIFITEGRAST 5 % OP SOLN
1.0000 [drp] | Freq: Every day | OPHTHALMIC | Status: DC | PRN
Start: 2021-05-15 — End: 2021-05-17

## 2021-05-15 MED ORDER — APIXABAN 2.5 MG PO TABS
2.5000 mg | ORAL_TABLET | Freq: Two times a day (BID) | ORAL | Status: DC
  Administered 2021-05-15 – 2021-05-17 (×5): 2.5 mg via ORAL
  Filled 2021-05-15 (×5): qty 1

## 2021-05-15 MED ORDER — ONDANSETRON HCL 4 MG/2ML IJ SOLN: 4.0000 mg | Freq: Four times a day (QID) | INTRAMUSCULAR | Status: DC | PRN

## 2021-05-15 MED ORDER — LORAZEPAM 0.5 MG PO TABS
0.5000 mg | ORAL_TABLET | Freq: Two times a day (BID) | ORAL | Status: DC | PRN
  Administered 2021-05-16: 03:00:00 0.5 mg via ORAL
  Filled 2021-05-15: qty 1

## 2021-05-15 MED ORDER — SODIUM CHLORIDE 0.9% FLUSH
3.0000 mL | Freq: Two times a day (BID) | INTRAVENOUS | Status: DC
  Administered 2021-05-15 – 2021-05-17 (×5): 3 mL via INTRAVENOUS

## 2021-05-15 MED ORDER — SODIUM CHLORIDE (HYPERTONIC) 5 % OP OINT
1.0000 "application " | TOPICAL_OINTMENT | Freq: Every day | OPHTHALMIC | Status: DC
  Filled 2021-05-15: qty 3.5

## 2021-05-15 MED ORDER — LORAZEPAM 1 MG PO TABS
1.0000 mg | ORAL_TABLET | Freq: Once | ORAL | Status: AC
  Administered 2021-05-15: 02:00:00 1 mg via ORAL
  Filled 2021-05-15: qty 1

## 2021-05-15 MED ORDER — POLYVINYL ALCOHOL 1.4 % OP SOLN
1.0000 [drp] | Freq: Every day | OPHTHALMIC | Status: DC | PRN
  Filled 2021-05-15: qty 15

## 2021-05-15 NOTE — ED Notes (Signed)
Pt ambulated w/ family and rolling walker to the bathroom

## 2021-05-15 NOTE — ED Notes (Signed)
Pt is eating lunch

## 2021-05-15 NOTE — ED Notes (Signed)
Patient transported to CT 

## 2021-05-15 NOTE — ED Provider Notes (Signed)
Patient transferred for MRI.  Had HA started yesterday with associated nausea.  HA frontal and top of head.  Seen in ED and had CTA that demonstrated aneurysm, transferred for MRI.  Patient declined LP, and she happens to be on Eliquis.  Started on hydralazine two weeks ago.    Pt with HA that started at noon yesterday.  Initially BP 155, by 11pm increased to 563 systolic.    MRI concerning for Pres.  BP currently 178/109.  Pt sleeping due to sedating meds needed for MRI.    MRI returned concerning for possible press.  Discussed with Dr. Theda Sers with neurology.  Recommend CT venogram to rule out dural sinus thrombosis.  Also recommends blood pressure control for possible press.  Goal blood pressure range is 1 87-5 50 systolic.  Patient care transferred pending CT venogram, blood pressure reevaluation after medications.    Quintella Reichert, MD 05/15/21 [phone]

## 2021-05-15 NOTE — ED Notes (Signed)
[facility] paged for transport to [facility] ED

## 2021-05-15 NOTE — H&P (Addendum)
HISTORY AND PHYSICAL       PATIENT DETAILS Name: Sarah [name] Age: 87 y.o. Sex: female Date of Birth: [birth year] Admit Date: 05/14/2021 [id number], Christiane Ha, MD   Patient coming from: [place]:  Headache  HPI: Sarah [name] is a 87 y.o. female with medical history significant of HTN, lymphoma in remission, history of breast cancer-in remission, history of VTE on Eliquis-presented to the hospital for evaluation of headache.  Headache was described as acute onset-bilateral/frontal area-10/10 at its's worst severity, associated with some nausea but no vomiting.  She was initially evaluated at [place] a CT angio head showed a 2 mm right ICA aneurysm-headache completely resolved with a migraine cocktail-Case was discussed with neurosurgery who thought that the risk of a spontaneous rupture of a 2 mm aneurysm was extremely low-and plans were for discharge, however patient's headache reoccurred-recent concerns for a sentinel headache and SAH.  Since patient was on anticoagulation-LP was not done-instead patient was transferred to [facility] ED for a MRI brain-which showed changes consistent with PRES.  Neurology was consulted-patient was then referred to the hospitalist service for further inpatient evaluation/admission.  Patient denies any fever, vomiting, chest pain, shortness of breath.  There is no history of diarrhea.  No abdominal pain.  No history of URI symptoms.   Note-COVID-19 PCR pending at the time of admission.   ED Course: See above  Note: Lives at: Home Mobility: Independent Chronic Indwelling Foley:no   REVIEW OF SYSTEMS:  Constitutional:   No  weight loss, night sweats,  Fevers, chills, fatigue.  HEENT:    No  Dysphagia,Tooth/dental problems,Sore throat,  No sneezing, itching, ear ache, nasal congestion, post nasal drip  Cardio-vascular: No chest pain,Orthopnea, PND,lower extremity edema, anasarca,  palpitations  GI:  No heartburn, indigestion, abdominal pain, nausea, vomiting, diarrhea, melena or hematochezia  Resp: No shortness of breath, cough, hemoptysis,plueritic chest pain.   Skin:  No rash or lesions.  GU:  No dysuria, change in color of urine, no urgency or frequency.  No flank pain.  Musculoskeletal: No joint pain or swelling.  No decreased range of motion.  No back pain.  Endocrine: No heat intolerance, no cold intolerance, no polyuria, no polydipsia  Psych: No change in mood or affect. No depression or anxiety.  No memory loss.   ALLERGIES:   Allergies  Allergen Reactions   Citalopram Anxiety   Alendronate Sodium Other (See Comments)   Amoxicillin-Pot Clavulanate Other (See Comments)   Codeine Other (See Comments)    Reaction not recalled   Latex    Lisinopril Other (See Comments)   Septra [Sulfamethoxazole-Trimethoprim] Other (See Comments)   Thiazide-Type Diuretics Other (See Comments)    Blurry vision?? (patient stated she has macular degeneration)    PAST MEDICAL HISTORY: Past Medical History:  Diagnosis Date   Allergy    codeine, thiazides   Anxiety    new dx   Arthritis    Breast cancer ([place]) 03/14/12   bx=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,ER/PR=+,upper inner quad   Cancer (HCC)    breast   Cataract    DVT (deep venous thrombosis) ([place]) 02/2019   left leg   Dyspnea    Edema    both legs feet and toe, abdomen   Glaucoma    laser treated years ago   HOH (hard of hearing)    Hyperlipemia    Hypertension    Hypothyroidism    Pancreatic cyst  benign   PONV (postoperative nausea and vomiting)    Pulmonary embolism ([place]) 02/2019   bilateral    Radiation 06/11/2012-07/12/2012   17 sessions 4250 cGy, 3 sessions 750 cGy   Vertigo    Wears glasses    Wears partial dentures    partial upper    PAST SURGICAL HISTORY: Past Surgical History:  Procedure Laterality Date   ABDOMINAL HYSTERECTOMY  1966   1/2  ovary left in    [place]   lumpectomy-lt   CATARACT EXTRACTION     b/l   COLONOSCOPY     EXCISION MASS NECK Left 03/17/2019    EXCISION MASS NECK (Left Neck)   EXCISION MASS NECK Left 03/17/2019   Procedure: EXCISION MASS NECK;  Surgeon: Helayne Seminole, MD;  Location: [place] OR;  Service: ENT;  Laterality: Left;   EYE SURGERY Bilateral    bilateral cataract removal   IR IMAGING GUIDED PORT INSERTION  04/23/2019   JOINT REPLACEMENT  2013   rt total knee   JOINT REPLACEMENT  1995   lt total knee   PARTIAL MASTECTOMY WITH NEEDLE LOCALIZATION  04/09/2012   Procedure: PARTIAL MASTECTOMY WITH NEEDLE LOCALIZATION;  Surgeon: Adin Hector, MD;  Location: [place];  Service: General;  Laterality: Right;   SKIN BIOPSY Left 03/17/2019   LEFT THIGH   SKIN BIOPSY Left 03/17/2019   Procedure: Skin Biopsy Left Thigh;  Surgeon: Helayne Seminole, MD;  [facility];  Service: ENT;  Laterality: Left;   TONSILLECTOMY     TOTAL KNEE ARTHROPLASTY  08/16/2011   Procedure: TOTAL KNEE ARTHROPLASTY;  Surgeon: Ninetta Lights, MD;  Location: [place];  Service: Orthopedics;  Laterality: Right;    MEDICATIONS AT HOME: Prior to Admission medications   Medication Sig Start Date End Date Taking? Authorizing Provider  acetaminophen (TYLENOL) 500 MG tablet Take 1,000 mg by mouth every 6 (six) hours as needed for moderate pain.   Yes [provider]  B Complex-C (SUPER B COMPLEX PO) Take 1 tablet by mouth daily.   Yes [provider]  Cholecalciferol (VITAMIN D) 50 MCG (2000 UT) tablet Take 2,000 Units by mouth 2 (two) times daily.   Yes [provider]  citalopram (CELEXA) 10 MG tablet Take 5 mg by mouth daily. 03/01/21  Yes [provider]  ELIQUIS 2.5 MG TABS tablet TAKE 1 TABLET TWICE A DAY Patient taking differently: Take 2.5 mg by mouth 2 (two) times daily. 03/28/21  Yes Brunetta Genera, MD  hydrALAZINE (APRESOLINE) 25 MG tablet  Take 1 tablet (25 mg total) by mouth 3 (three) times daily. Patient taking differently: Take 25 mg by mouth in the morning and at bedtime. 05/04/21 06/03/21 Yes Luna Fuse, MD  levothyroxine (SYNTHROID) 25 MCG tablet Take 25 mcg by mouth daily before breakfast.  09/20/18  Yes [provider]  lidocaine-prilocaine (EMLA) cream Apply to affected area once Patient taking differently: Apply 1 application topically See admin instructions. Apply to port site prior to port access 04/13/20  Yes Brunetta Genera, MD  LORazepam (ATIVAN) 0.5 MG tablet Take 1 tablet (0.5 mg total) by mouth 2 (two) times daily as needed for anxiety or sleep. 12/16/20  Yes Brunetta Genera, MD  Multiple Vitamins-Minerals (PRESERVISION AREDS PO) Take 1 capsule by mouth 2 (two) times daily.    Yes [provider]  ondansetron (ZOFRAN) 8 MG tablet Take 1 tablet (8 mg total) by mouth 2 (two) times  daily as needed. Start on the third day after chemotherapy. 04/14/19  Yes Brunetta Genera, MD  Polyethyl Glycol-Propyl Glycol (SYSTANE) 0.4-0.3 % GEL ophthalmic gel Place 1 application into both eyes daily as needed (dry eyes).   Yes [provider]  sodium chloride (MURO 128) 5 % ophthalmic ointment Place 1 application into both eyes at bedtime.   Yes [provider]  XIIDRA 5 % SOLN Place 1 drop into both eyes daily as needed (dry eyes). 10/22/19  Yes [provider]  zolpidem (AMBIEN) 5 MG tablet Take 5 mg by mouth at bedtime. 12/07/19  Yes [provider]  clonazepam (KLONOPIN) 0.125 MG disintegrating tablet Take 1 tablet (0.125 mg total) by mouth 2 (two) times daily. Patient not taking: Reported on 10/14/2020 03/23/20   Brunetta Genera, MD  escitalopram (LEXAPRO) 10 MG tablet Take 1 tablet (10 mg total) by mouth daily. Patient not taking: Reported on 04/07/2021 02/25/21   Brunetta Genera, MD  gabapentin (NEURONTIN) 100 MG capsule Take 2 capsules (200 mg total) by  mouth at bedtime. Patient not taking: Reported on 10/14/2020 09/29/20   Brunetta Genera, MD  levofloxacin (LEVAQUIN) 500 MG tablet Take 1 tablet (500 mg total) by mouth daily. Consider taking over-the-counter probiotics to reduce the risk of diarrhea from antibiotics. Patient not taking: Reported on 04/07/2021 02/03/21   Brunetta Genera, MD    FAMILY HISTORY: Family History  Problem Relation Age of Onset   Heart disease Father    Cancer Maternal Aunt        stomach    SOCIAL HISTORY:  reports that she has never smoked. She has never used smokeless tobacco. She reports that she does not drink alcohol and does not use drugs.  PHYSICAL EXAM: Blood pressure (!) 156/94, pulse 88, temperature 98 F (36.7 C), temperature source Oral, resp. rate 16, height 5\' 4"  (1.626 m), weight 45.4 kg, SpO2 97 %.  General appearance :Awake, alert, not in any distress.  Eyes:, pupils equally reactive to light and accomodation,no scleral icterus.Pink conjunctiva HEENT: Atraumatic and Normocephalic Neck: supple, no JVD.  Resp:Good air entry bilaterally, no added sounds  CVS: S1 S2 regular, no murmurs.  GI: Bowel sounds present, Non tender and not distended with no gaurding, rigidity or rebound. Extremities: B/L Lower Ext shows no edema, both legs are warm to touch Neurology:  speech clear,Non focal, sensation is grossly intact. Psychiatric: Normal judgment and insight. Alert and oriented x 3. Normal mood. Musculoskeletal:gait appears to be normal.No digital cyanosis Skin:No Rash, warm and dry Wounds:N/A  LABS ON ADMISSION:  I have personally reviewed following labs and imaging studies  CBC: Recent Labs  Lab 05/14/21 2045  WBC 4.5  NEUTROABS 3.2  HGB 14.3  HCT 44.7  MCV 91.8  PLT 096    Basic Metabolic Panel: Recent Labs  Lab 05/14/21 2045  NA 143  K 3.7  CL 105  CO2 30  GLUCOSE 88  BUN 24*  CREATININE 0.57  CALCIUM 9.8    GFR: Estimated Creatinine Clearance: 36.2 mL/min  (by C-G formula based on SCr of 0.57 mg/dL).  Liver Function Tests: No results for input(s): AST, ALT, ALKPHOS, BILITOT, PROT, ALBUMIN in the last 168 hours. No results for input(s): LIPASE, AMYLASE in the last 168 hours. No results for input(s): AMMONIA in the last 168 hours.  Coagulation Profile: No results for input(s): INR, PROTIME in the last 168 hours.  Cardiac Enzymes: No results for input(s): CKTOTAL, CKMB, CKMBINDEX, TROPONINI in  the last 168 hours.  BNP (last 3 results) No results for input(s): PROBNP in the last 8760 hours.  HbA1C: No results for input(s): HGBA1C in the last 72 hours.  CBG: No results for input(s): GLUCAP in the last 168 hours.  Lipid Profile: No results for input(s): CHOL, HDL, LDLCALC, TRIG, CHOLHDL, LDLDIRECT in the last 72 hours.  Thyroid Function Tests: No results for input(s): TSH, T4TOTAL, FREET4, T3FREE, THYROIDAB in the last 72 hours.  Anemia Panel: No results for input(s): VITAMINB12, FOLATE, FERRITIN, TIBC, IRON, RETICCTPCT in the last 72 hours.  Urine analysis:    Component Value Date/Time   COLORURINE YELLOW 05/14/2021 2300   APPEARANCEUR CLEAR 05/14/2021 2300   LABSPEC 1.013 05/14/2021 2300   PHURINE 7.0 05/14/2021 2300   GLUCOSEU NEGATIVE 05/14/2021 2300   HGBUR NEGATIVE 05/14/2021 2300   BILIRUBINUR NEGATIVE 05/14/2021 2300   KETONESUR NEGATIVE 05/14/2021 2300   PROTEINUR TRACE (A) 05/14/2021 2300   UROBILINOGEN 0.2 04/09/2012 1202   NITRITE NEGATIVE 05/14/2021 2300   LEUKOCYTESUR TRACE (A) 05/14/2021 2300    Sepsis Labs: Lactic Acid, Venous    Component Value Date/Time   LATICACIDVEN 0.8 12/31/2019 0104     Microbiology: No results found for this or any previous visit (from the past 240 hour(s)).    RADIOLOGIC STUDIES ON ADMISSION: CT ANGIO HEAD NECK W WO CM  Result Date: 05/14/2021 CLINICAL DATA:  Thunderclap headache EXAM: CT ANGIOGRAPHY HEAD AND NECK TECHNIQUE: Multidetector CT imaging of the head and neck  was performed using the standard protocol during bolus administration of intravenous contrast. Multiplanar CT image reconstructions and MIPs were obtained to evaluate the vascular anatomy. Carotid stenosis measurements (when applicable) are obtained utilizing NASCET criteria, using the distal internal carotid diameter as the denominator. CONTRAST:  172mL OMNIPAQUE IOHEXOL 350 MG/ML SOLN COMPARISON:  Head CT same day FINDINGS: CTA NECK FINDINGS SKELETON: There is no bony spinal canal stenosis. No lytic or blastic lesion. OTHER NECK: Normal pharynx, larynx and major salivary glands. No cervical lymphadenopathy. Unremarkable thyroid gland. UPPER CHEST: Biapical scarring. Mild tree-in-bud opacity at the posterior right upper lobe AORTIC ARCH: There is calcific atherosclerosis of the aortic arch. There is no aneurysm, dissection or hemodynamically significant stenosis of the visualized portion of the aorta. Normal 3 vessel aortic branching pattern. The visualized proximal subclavian arteries are widely patent. RIGHT CAROTID SYSTEM: No dissection, occlusion or aneurysm. Minimal atherosclerotic calcification at the carotid bifurcation without hemodynamically significant stenosis. LEFT CAROTID SYSTEM: No dissection, occlusion or aneurysm. Minimal atherosclerotic calcification at the carotid bifurcation without hemodynamically significant stenosis. VERTEBRAL ARTERIES: Left dominant configuration. Both origins are clearly patent. There is no dissection, occlusion or flow-limiting stenosis to the skull base (V1-V3 segments). CTA HEAD FINDINGS POSTERIOR CIRCULATION: --Vertebral arteries: Normal V4 segments. --Inferior cerebellar arteries: Normal. --Basilar artery: Normal. --Superior cerebellar arteries: Normal. --Posterior cerebral arteries (PCA): Normal. ANTERIOR CIRCULATION: --Intracranial internal carotid arteries: There is a 2 mm aneurysm projecting inferiorly and medially from the clinoid segment of the right internal  carotid artery (series 7, image 110 and series 10, image 134). The internal carotid arteries at the skull base are otherwise unremarkable. --Anterior cerebral arteries (ACA): Normal. Both A1 segments are present. Patent anterior communicating artery (a-comm). --Middle cerebral arteries (MCA): Normal. VENOUS SINUSES: As permitted by contrast timing, patent. ANATOMIC VARIANTS: None Review of the MIP images confirms the above findings. IMPRESSION: 1. No emergent large vessel occlusion or high-grade stenosis of the intracranial arteries. 2. There is a 2 mm aneurysm projecting inferiorly and  medially from the clinoid segment of the right internal carotid artery. 3. Mild tree-in-bud opacity at the posterior right upper lobe could indicate endobronchial infection. Aortic Atherosclerosis (ICD10-I70.0). Electronically Signed   By: Ulyses Jarred M.D.   On: 05/14/2021 22:10   CT Head Wo Contrast  Result Date: 05/14/2021 CLINICAL DATA:  Provided history: Headache, new or worsening. Additional history provided: Patient reports pain behind eyes, elevated blood pressure. EXAM: CT HEAD WITHOUT CONTRAST TECHNIQUE: Contiguous axial images were obtained from the base of the skull through the vertex without intravenous contrast. COMPARISON:  Structures 03/23/2021. FINDINGS: Brain: Mild generalized cerebral and cerebellar atrophy. At least moderate patchy and confluent hypoattenuation within the cerebral white matter, nonspecific but compatible with chronic small vessel ischemic disease. There is no acute intracranial hemorrhage. No demarcated cortical infarct. No extra-axial fluid collection. No evidence of an intracranial mass. No midline shift. Vascular: No hyperdense vessel. Atherosclerotic calcifications. Skull: Normal. Negative for fracture or focal lesion. Sinuses/Orbits: Visualized orbits show no acute finding. No significant paranasal sinus disease at the imaged levels. Redemonstrated fairly symmetric discoid/polypoid soft  tissue within the olfactory clefts of the nasal cavities. IMPRESSION: No evidence of acute intracranial abnormality. At least moderate chronic small vessel ischemic changes within the cerebral white matter. Mild generalized parenchymal atrophy. Fairly symmetric discoid/polypoid soft tissue within the olfactory clefts of the nasal cavities. These may reflect polyps or sinonasal respiratory epithelial adenomatoid hamartoma (REAH). Electronically Signed   By: Kellie Simmering D.O.   On: 05/14/2021 20:01   MR BRAIN WO CONTRAST  Result Date: 05/15/2021 CLINICAL DATA:  87 year old female with severe sudden headache. Nausea. Hypertensive at presentation (196/116). But afebrile (97 F). History of lymphoma. EXAM: MRI HEAD WITHOUT CONTRAST TECHNIQUE: Multiplanar, multiecho pulse sequences of the brain and surrounding structures were obtained without intravenous contrast. COMPARISON:  CTA head and neck and plain head CT yesterday. FINDINGS: Brain: Punctate focus of restricted diffusion in the right parietal cortex on series 2, image 32. However, much larger area of abnormal edema in the nearby postcentral gyrus (series 6, image 23 and series 250, image 34) and also throughout the left mesial temporal lobe (series 6, image 12) are facilitated on diffusion. Gyral T2 FLAIR hyperintensity and enlargement in those areas, affecting both gray and white matter. Decreased gyral T1 signal. No evidence of associated hemorrhage. No significant mass effect. No involvement in the left hemisphere. Elsewhere there is patchy and confluent T2 and FLAIR hyperintensity in the bilateral cerebral white matter and deep gray matter nuclei compatible with chronic small vessel disease. Similar patchy heterogeneity in the pons. Chronic microhemorrhage in the right deep cerebellar nuclei on series 7, image 26. No other chronic cerebral blood products. No cortical encephalomalacia identified. No midline shift, mass effect, ventriculomegaly, extra-axial  collection or acute intracranial hemorrhage. Cervicomedullary junction and pituitary are within normal limits. Vascular: Major intracranial vascular flow voids are preserved. Skull and upper cervical spine: Negative for age visible cervical spine. Visualized bone marrow signal is within normal limits. Sinuses/Orbits: Postoperative changes to both globes, otherwise negative orbits. Minor paranasal sinus mucosal thickening. Other: Mastoids are clear. Grossly normal visible internal auditory structures. IMPRESSION: 1. Confluent gyral edema affecting both gray and white matter in the right mesial temporal lobe, and also the right parietal lobe postcentral gyrus. No associated hemorrhage or mass effect, and diffusion in those gyri is facilitated. Lack of fever and confusion argue against Herpes Encephalitis. And this may be an unusual unilateral presentation of posterior reversible encephalopathy syndrome (PRES) given the  degree of hypertension. Other nonspecific Encephalitis is possible. The appearance is NOT typical of CNS lymphoma. 2. However, there is also evidence of a punctate cortical infarct in the right parietal lobe on series 2, image 32. No other acute ischemia identified. 3. No other acute intracranial abnormality. Moderate for age underlying chronic signal changes compatible with small vessel disease. Electronically Signed   By: Genevie Ann M.D.   On: 05/15/2021 06:58   CT VENOGRAM HEAD  Result Date: 05/15/2021 CLINICAL DATA:  87 year old female with severe sudden headache. Nausea. Hypertensive at presentation (196/116). But afebrile (97 F). Abnormal History of lymphoma.  Brain MRI without contrast this morning. EXAM: CT VENOGRAM HEAD TECHNIQUE: Venographic phase images of the brain were obtained following the administration of intravenous contrast. Multiplanar reformats and maximum intensity projections were generated. CONTRAST:  32mL OMNIPAQUE IOHEXOL 350 MG/ML SOLN COMPARISON:  Brain MRI 0622 hours. CT  head without contrast 05/14/2021. CTA head and neck 05/14/2021. FINDINGS: Venous sinuses: Fairly early venous contrast timing on the CTA yesterday. Good intravenous contrast opacification now. Superior sagittal sinus, torcula, straight sinus, vein of Galen and internal cerebral veins are enhancing and patent. Bilateral transverse and sigmoid sinuses are enhancing and patent. Bilateral IJ bulbs are patent. Cavernous sinus is enhancing and appears normal. The major cortical draining veins also appear to be enhancing and patent. Major intracranial arterial structures are enhancing and appear to be patent. Other findings: No enhancement of the abnormal mesial right temporal and right parietal gyri demonstrating CT hypodensity in keeping with the edema on MRI. No intracranial mass effect. No ventriculomegaly. Gray-white matter differentiation elsewhere appears stable since 05/14/2021. Review of the MIP images confirms the above findings IMPRESSION: 1. Normal intracranial CTV. No evidence of dural venous thrombosis. 2. No CT enhancement of the edematous mesial right temporal and right parietal gyri. 3. No new intracranial abnormality identified. Electronically Signed   By: Genevie Ann M.D.   On: 05/15/2021 08:39      EKG:  Personally reviewed.  Sinus rhythm  ASSESSMENT AND PLAN: Headache-likely due to PRES: Currently drowsy-got migraine cocktail and other sedative medications earlier-but headache is much better.  Blood pressure still on the higher side-we will try to optimize her antihypertensive regimen.  CTA head/CTV head negative for any significant abnormalities.  MRI brain negative for any SAH.  Discussed with neurology-no plans for LP-as likely to be PRES.  Continue supportive care-await formal Recommendations from neurology.  Acute Punctate infarct in right parietal lobe: incidental finding-will defer to Neuro to see if any utility of further work up  Hypertension: Uncontrolled-continue hydralazine-add  Coreg-optimize closely over the next few hours-hopefully once blood pressure is controlled-PR ES symptoms/headache will improve.  Per patient-her blood pressure normally is in the 962 systolic range-2 weeks ago she was evaluated [facility] ED and was found to have uncontrolled hypertension-she was apparently started on hydralazine recently by her PCP.  2 mm right ICA aneurysm: Likely incidental finding-we will await further recommendations from neurology  History of VTE: Continue Eliquis-discussed with Sheldon Silvan with neurology-no plans for LP.  History of T-cell lymphoma: Currently in remission-follows with oncology in the outpatient setting.  History of breast cancer in 2013: Currently in remission  Further plan will depend as patient's clinical course evolves and further radiologic and laboratory data become available. Patient will be monitored closely.  Above noted plan was discussed with patient/daughter [facility]) face to face at bedside, they were in agreement.   CONSULTS: Neurology   DVT Prophylaxis: Eliquis  Code Status: DNR-confirmed with daughter at bedside.  Disposition Plan:  Discharge back home in 1-2 days  Admission status:  Inpatient v going to tele   The medical decision making on this patient was of high complexity and the patient is at high risk for clinical deterioration, therefore this is a level 3 visit.    Total time spent  55 minutes.Greater than 50% of this time was spent in counseling, explanation of diagnosis, planning of further management, and coordination of care.  Severity of illness: The appropriate patient status for this patient is INPATIENT. Inpatient status is judged to be reasonable and necessary in order to provide the required intensity of service to ensure the patient's safety. The patient's presenting symptoms, physical exam findings, and initial radiographic and laboratory data in the context of their chronic comorbidities is felt to  place them at high risk for further clinical deterioration. Furthermore, it is not anticipated that the patient will be medically stable for discharge from the hospital within 2 midnights of admission. The following factors support the patient status of inpatient.   " The patient's presenting symptoms include acute onset of severe intractable headache. " The worrisome physical exam findings include drowsy, uncontrolled hypertension " The initial radiographic and laboratory data are worrisome because of PR ES " The chronic co-morbidities include HTN, lymphoma, breast cancer   * I certify that at the point of admission it is my clinical judgment that the patient will require inpatient hospital care spanning beyond 2 midnights from the point of admission due to high intensity of service, high risk for further deterioration and high frequency of surveillance required.**  Mykel Sponaugle [facility] Pager [phone]  If 7PM-7AM, please contact night-coverage  Please page via [email or web address]  Go to [email or web address] and use [place]'s universal password to access. If you do not have the password, please contact the hospital operator.  Locate the [facility] provider you are looking for under [facility] and page to a number that you can be directly reached. If you still have difficulty reaching the provider, please page the [facility] (Director on Call) for the Hospitalists listed on amion for assistance.  05/15/2021, 10:00 AM

## 2021-05-15 NOTE — Consult Note (Signed)
Neurology Consultation  Reason for Consult: encephalopathy. Referring Physician: Nena Alexander, MD.   CC: Encephalopathy.   History is obtained from: Patient, daughter, chart.   HPI: Sarah [name] is a 87 y.o. female with a PMHx of DVT, PE on Eliquis, HTN, anxiety, breast cancer, hypothyroidism, immunoblastic T cell lyumphoma, glaucoma, and HLD. Patient presented to [facility] ED with c/o HA and her BP was found to be high. CTH negative for hemorrhage or other acute finding. CTA head and neck showed noo LVO, but a 31mm aneursym. NSU was consulted and no intervention or aneurysm was advised, just f/up. She was transferred to [facility] for MRI. MRI showed findings consistent with PRES and an acute cortical punctate stroke. CTV ruled out venous sinus thrombosis.   She is sleepy, but is able to answer all orientation questions. Daughter states she had a MHA cocktail/Ativan for MRI and has been sleepy since.   Daughter states her mom probably missed a couple of days of Eliquis.   Due to incidental finding of stroke on MRI brain and encephalopathy, neurology is asked to consult.   ROS: A robust ROS was performed and is negative except as noted in the HPI.    Past Medical History:  Diagnosis Date   Allergy    codeine, thiazides   Anxiety    new dx   Arthritis    Breast cancer ([place]) 03/14/12   bx=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,ER/PR=+,upper inner quad   Cancer (HCC)    breast   Cataract    DVT (deep venous thrombosis) ([place]) 02/2019   left leg   Dyspnea    Edema    both legs feet and toe, abdomen   Glaucoma    laser treated years ago   HOH (hard of hearing)    Hyperlipemia    Hypertension    Hypothyroidism    Pancreatic cyst    benign   PONV (postoperative nausea and vomiting)    Pulmonary embolism ([place]) 02/2019   bilateral    Radiation 06/11/2012-07/12/2012   17 sessions 4250 cGy, 3 sessions 750 cGy   Vertigo    Wears glasses    Wears partial dentures     partial upper   Family History  Problem Relation Age of Onset   Heart disease Father    Cancer Maternal Aunt        stomach   Social History:   reports that she has never smoked. She has never used smokeless tobacco. She reports that she does not drink alcohol and does not use drugs.  Medications  Current Facility-Administered Medications:    apixaban (ELIQUIS) tablet 2.5 mg, 2.5 mg, Oral, BID, Kirby-Graham, Karsten Fells, NP   hydrALAZINE (APRESOLINE) injection 10 mg, 10 mg, Intravenous, Once, Dorie Rank, MD   levothyroxine (SYNTHROID) tablet 25 mcg, 25 mcg, Oral, Q0600, Kirby-Graham, Karsten Fells, NP  Current Outpatient Medications:    acetaminophen (TYLENOL) 500 MG tablet, Take 1,000 mg by mouth every 6 (six) hours as needed for moderate pain., Disp: , Rfl:    B Complex-C (SUPER B COMPLEX PO), Take 1 tablet by mouth daily., Disp: , Rfl:    Cholecalciferol (VITAMIN D) 50 MCG (2000 UT) tablet, Take 2,000 Units by mouth 2 (two) times daily., Disp: , Rfl:    citalopram (CELEXA) 10 MG tablet, Take 5 mg by mouth daily., Disp: , Rfl:    ELIQUIS 2.5 MG TABS tablet, TAKE 1 TABLET TWICE A DAY (Patient taking differently: Take 2.5 mg by mouth 2 (  two) times daily.), Disp: 180 tablet, Rfl: 1   hydrALAZINE (APRESOLINE) 25 MG tablet, Take 1 tablet (25 mg total) by mouth 3 (three) times daily. (Patient taking differently: Take 25 mg by mouth in the morning and at bedtime.), Disp: 90 tablet, Rfl: 0   levothyroxine (SYNTHROID) 25 MCG tablet, Take 25 mcg by mouth daily before breakfast. , Disp: , Rfl:    lidocaine-prilocaine (EMLA) cream, Apply to affected area once (Patient taking differently: Apply 1 application topically See admin instructions. Apply to port site prior to port access), Disp: 30 g, Rfl: 3   LORazepam (ATIVAN) 0.5 MG tablet, Take 1 tablet (0.5 mg total) by mouth 2 (two) times daily as needed for anxiety or sleep., Disp: 30 tablet, Rfl: 0   Multiple Vitamins-Minerals (PRESERVISION AREDS PO),  Take 1 capsule by mouth 2 (two) times daily. , Disp: , Rfl:    ondansetron (ZOFRAN) 8 MG tablet, Take 1 tablet (8 mg total) by mouth 2 (two) times daily as needed. Start on the third day after chemotherapy., Disp: 30 tablet, Rfl: 1   Polyethyl Glycol-Propyl Glycol (SYSTANE) 0.4-0.3 % GEL ophthalmic gel, Place 1 application into both eyes daily as needed (dry eyes)., Disp: , Rfl:    sodium chloride (MURO 128) 5 % ophthalmic ointment, Place 1 application into both eyes at bedtime., Disp: , Rfl:    XIIDRA 5 % SOLN, Place 1 drop into both eyes daily as needed (dry eyes)., Disp: , Rfl:    zolpidem (AMBIEN) 5 MG tablet, Take 5 mg by mouth at bedtime., Disp: , Rfl:    clonazepam (KLONOPIN) 0.125 MG disintegrating tablet, Take 1 tablet (0.125 mg total) by mouth 2 (two) times daily. (Patient not taking: Reported on 10/14/2020), Disp: 60 tablet, Rfl: 0   escitalopram (LEXAPRO) 10 MG tablet, Take 1 tablet (10 mg total) by mouth daily. (Patient not taking: Reported on 04/07/2021), Disp: , Rfl:    gabapentin (NEURONTIN) 100 MG capsule, Take 2 capsules (200 mg total) by mouth at bedtime. (Patient not taking: Reported on 10/14/2020), Disp: 60 capsule, Rfl: 2   levofloxacin (LEVAQUIN) 500 MG tablet, Take 1 tablet (500 mg total) by mouth daily. Consider taking over-the-counter probiotics to reduce the risk of diarrhea from antibiotics. (Patient not taking: Reported on 04/07/2021), Disp: 5 tablet, Rfl: 0  Exam: Current vital signs: BP (!) 150/101    Pulse 79    Temp 98 F (36.7 C) (Oral)    Resp 16    Ht 5\' 4"  (1.626 m)    Wt 45.4 kg    SpO2 97%    BMI 17.16 kg/m  Vital signs in last 24 hours: Temp:  [97.5 F (36.4 C)-98 F (36.7 C)] 98 F (36.7 C) (12/25 0335) Pulse Rate:  [28-100] 79 (12/25 1000) Resp:  [11-21] 16 (12/25 1000) BP: (150-196)/(87-131) 150/101 (12/25 1000) SpO2:  [96 %-100 %] 97 % (12/25 1000) Weight:  [45.4 kg] 45.4 kg (12/24 1737)  PE: GENERAL: Well appearing, sleepy female in NAD.    HEENT: normocephalic and atraumatic. No nuchal rigidity or pain with ROM neck.  LUNGS - Normal respiratory effort.  CV - RRR on tele. ABDOMEN - Soft, nontender. Ext: warm, well perfused. Psych: affect appropriate to situation.   NEURO:  Mental Status: Alert and oriented x4. Only question she missed was how many quarters in $2.75.  Speech/Language: speech is without dysarthria or aphasia.  Naming, repetition, fluency, and comprehension intact.  Cranial Nerves:  II: PERRL 2 mm/brisk. visual fields full.  III, IV, VI: EOMI. Lid elevation symmetric and full.  V: sensation is intact and symmetrical to face.  VII: Smile is symmetrical.  VIII:hearing intact to voice. IX, X: palate elevation is symmetric. Phonation normal.  XI: normal sternocleidomastoid and trapezius muscle strength. HKV:QQVZDG is symmetrical without fasciculations.   Motor:  5/5 throughout.  Tone is normal. Bulk is normal.  Sensation- Intact to light touch bilaterally in all four extremities. Extinction absent to DSS.  Coordination: FTN intact bilaterally. HKS intact bilaterally. No pronator drift.  Gait- deferred.  NIHSS:  1a Level of Consciousness: 0 1b LOC Questions: 0 1c LOC Commands: 0 2 Best Gaze: 0 3 Visual: 0 4 Facial Palsy:0  5a Motor Arm - left: 0 5b Motor Arm - Right: 0 6a Motor Leg - Left: 0 6b Motor Leg - Right: 0 7 Limb Ataxia: 0 8 Sensory: 0 9 Best Language: 0 10 Dysarthria: 0 11 Extinction and Inattention: 0 TOTAL: 0   Labs I have reviewed labs in epic and the results pertinent to this consultation are:  CBC    Component Value Date/Time   WBC 4.5 05/14/2021 2045   RBC 4.87 05/14/2021 2045   HGB 14.3 05/14/2021 2045   HGB 11.7 (L) 01/26/2021 0943   HGB 15.2 07/25/2016 1053   HCT 44.7 05/14/2021 2045   HCT 44.9 07/25/2016 1053   PLT 157 05/14/2021 2045   PLT 331 01/26/2021 0943   PLT 176 07/25/2016 1053   MCV 91.8 05/14/2021 2045   MCV 88.5 07/25/2016 1053   MCH 29.4  05/14/2021 2045   MCHC 32.0 05/14/2021 2045   RDW 13.8 05/14/2021 2045   RDW 13.7 07/25/2016 1053   LYMPHSABS 0.8 05/14/2021 2045   LYMPHSABS 0.9 07/25/2016 1053   MONOABS 0.5 05/14/2021 2045   MONOABS 0.4 07/25/2016 1053   EOSABS 0.1 05/14/2021 2045   EOSABS 0.1 07/25/2016 1053   BASOSABS 0.0 05/14/2021 2045   BASOSABS 0.1 07/25/2016 1053    CMP     Component Value Date/Time   NA 143 05/14/2021 2045   NA 141 07/25/2016 1053   K 3.7 05/14/2021 2045   K 3.6 07/25/2016 1053   CL 105 05/14/2021 2045   CL 106 04/25/2012 1606   CO2 30 05/14/2021 2045   CO2 27 07/25/2016 1053   GLUCOSE 88 05/14/2021 2045   GLUCOSE 102 07/25/2016 1053   GLUCOSE 114 (H) 04/25/2012 1606   BUN 24 (H) 05/14/2021 2045   BUN 15.5 07/25/2016 1053   CREATININE 0.57 05/14/2021 2045   CREATININE 0.78 04/07/2021 1328   CREATININE 0.8 07/25/2016 1053   CALCIUM 9.8 05/14/2021 2045   CALCIUM 10.1 07/25/2016 1053   PROT 6.4 (L) 05/04/2021 1815   PROT 7.5 07/25/2016 1053   ALBUMIN 4.1 05/04/2021 1815   ALBUMIN 4.3 07/25/2016 1053   AST 15 05/04/2021 1815   AST 16 04/07/2021 1328   AST 17 07/25/2016 1053   ALT 12 05/04/2021 1815   ALT 12 04/07/2021 1328   ALT 11 07/25/2016 1053   ALKPHOS 58 05/04/2021 1815   ALKPHOS 99 07/25/2016 1053   BILITOT 0.5 05/04/2021 1815   BILITOT 0.5 04/07/2021 1328   BILITOT 0.61 07/25/2016 1053   GFRNONAA >60 05/14/2021 2045   GFRNONAA >60 04/07/2021 1328   GFRAA >60 02/10/2020 0855   Imaging MD reviewed the images obtained.  CT head No evidence of acute intracranial abnormality.  At least moderate chronic small vessel ischemic changes within the cerebral white matter. Mild generalized parenchymal atrophy.  CT head and neck -No emergent large vessel occlusion or high-grade stenosis of the intracranial arteries. -There is a 2 mm aneurysm projecting inferiorly and medially from the clinoid segment of the right internal carotid artery. -Mild tree-in-bud opacity  at the posterior right upper lobe could indicate endobronchial infection. MRI brain -Confluent gyral edema affecting both gray and white matter in the right mesial temporal lobe, and also the right parietal lobe postcentral gyrus. No associated hemorrhage or mass effect, and diffusion in those gyri is facilitated.  -Lack of fever and confusion argue against Herpes Encephalitis. -And this may be an unusual unilateral presentation of posterior reversible encephalopathy syndrome (PRES) given the degree of hypertension. Other nonspecific Encephalitis is possible. The appearance is NOT typical of CNS lymphoma -However, there is also evidence of a punctate cortical infarct in the right parietal lobe on series 2, image 32. No other acute ischemia identified.  -No other acute intracranial abnormality. Moderate for age underlying chronic signal changes compatible with small vessel disease.  Assessment: 87 yo arrived to [facility] ED with worse HA of her life and a BP over 190/130. She was confused on arrival. [facility] r/o hemorrhage, CTA r/o LVO, MRI with small stroke. Her stroke is likely a combination of high BP and missing Eliquis dose. Except for the sleepiness, her exam reveals nothing focal for stroke. All imaging negative for hemorrhage, so we can forego LP now. No evidence of CNS infection, so do not suspect meningitis or encephalitis. As her BP comes down and sedation wears off, would expect her to wake up more.   Impression: -PRES. -Incidental finding of small stroke on MRI without contributing neurological deficits.   Recommendations/Plan:  -Admit by medicine.  -Stroke w/up.  -Echocardiogram.  -HbA1c with goal < 7.  -Lipid panel, goal LDL < 70, but would be hesitant to start statin in a woman of her age.  -BP goal 130-150.  -Stroke education.  -RN swallow test. If -, patient may eat or drink.  -PT/OT/SLP evaluation.  -restart Eliquis.  -Hold sedative psychiatric medications until fully awake.   -Stroke team will follow.   Pt seen by Clance Boll, NP/Neuro and later by MD. Note/plan to be edited by MD as needed.  Pager: [phone]   The patient was seen and examined. Reviewed chart including imaging. Discussed assessment with Clance Boll NP and agree with plan as outlined in the note above.

## 2021-05-15 NOTE — ED Notes (Signed)
Pt brought back from MRI

## 2021-05-15 NOTE — ED Notes (Signed)
Patient transported to MRI 

## 2021-05-15 NOTE — ED Provider Notes (Addendum)
Patient initially seen by Dr. Matilde Sprang.  Patient was seen yesterday evening for acute headache.  Patient was transferred to this facility for evaluation of persistent headache.  Patient had follow-up imaging including MRI.  No signs of aneurysmal bleeding.  Dr. Ayesha Rumpf discussed with neurology and MRI findings concerning for PRES. CT venogram was recommended and that does not show any acute findings.  Patient's blood pressure has improved slightly.  Most recently 160/100.  We will continue to treat her hypertension.  Pt is now sleeping. Updated family.  I will consult the medical service for admission   Clinical Course as of 05/15/21 0935  Sun May 15, 2021  0837 Blood pressure remains elevated despite initial dose of medications.  We will order an additional dose [JK]  0931 Pressure decreasing now at 160/100 [JK]    Clinical Course User Index [JK] Dorie Rank, MD    Dorie Rank, MD 05/15/21 [id number]    Dorie Rank, MD 05/15/21 [phone]

## 2021-05-16 ENCOUNTER — Inpatient Hospital Stay (HOSPITAL_COMMUNITY): Payer: Medicare Other

## 2021-05-16 DIAGNOSIS — I6330 Cerebral infarction due to thrombosis of unspecified cerebral artery: Secondary | ICD-10-CM | POA: Insufficient documentation

## 2021-05-16 DIAGNOSIS — I6389 Other cerebral infarction: Secondary | ICD-10-CM

## 2021-05-16 LAB — CBC
HCT: 42.8 % (ref 36.0–46.0)
Hemoglobin: 13.9 g/dL (ref 12.0–15.0)
MCH: 29.6 pg (ref 26.0–34.0)
MCHC: 32.5 g/dL (ref 30.0–36.0)
MCV: 91.3 fL (ref 80.0–100.0)
Platelets: 141 10*3/uL — ABNORMAL LOW (ref 150–400)
RBC: 4.69 MIL/uL (ref 3.87–5.11)
RDW: 14.1 % (ref 11.5–15.5)
WBC: 5.6 10*3/uL (ref 4.0–10.5)
nRBC: 0 % (ref 0.0–0.2)

## 2021-05-16 LAB — ECHOCARDIOGRAM COMPLETE
AR max vel: 1.8 cm2
AV Area VTI: 1.76 cm2
AV Area mean vel: 1.82 cm2
AV Mean grad: 4 mmHg
AV Peak grad: 6.8 mmHg
Ao pk vel: 1.3 m/s
Height: 64 in
S' Lateral: 2.3 cm
Weight: 1600 oz

## 2021-05-16 LAB — LIPID PANEL
Cholesterol: 189 mg/dL (ref 0–200)
HDL: 76 mg/dL (ref >40)
LDL Cholesterol: 98 mg/dL (ref 0–99)
Total CHOL/HDL Ratio: 2.5 RATIO
Triglycerides: 75 mg/dL (ref <150)
VLDL: 15 mg/dL (ref 0–40)

## 2021-05-16 LAB — TSH: TSH: 1.61 u[IU]/mL (ref 0.350–4.500)

## 2021-05-16 LAB — BASIC METABOLIC PANEL
Anion gap: 9 (ref 5–15)
BUN: 29 mg/dL — ABNORMAL HIGH (ref 8–23)
CO2: 26 mmol/L (ref 22–32)
Calcium: 9.1 mg/dL (ref 8.9–10.3)
Chloride: 105 mmol/L (ref 98–111)
Creatinine, Ser: 0.7 mg/dL (ref 0.44–1.00)
GFR, Estimated: >60 mL/min (ref >60)
Glucose, Bld: 95 mg/dL (ref 70–99)
Potassium: 3.2 mmol/L — ABNORMAL LOW (ref 3.5–5.1)
Sodium: 140 mmol/L (ref 135–145)

## 2021-05-16 LAB — HEMOGLOBIN A1C
Hgb A1c MFr Bld: 5.2 % (ref 4.8–5.6)
Mean Plasma Glucose: 102.54 mg/dL

## 2021-05-16 MED ORDER — GADOBUTROL 1 MMOL/ML IV SOLN
4.0000 mL | Freq: Once | INTRAVENOUS | Status: AC | PRN
  Administered 2021-05-16: 11:00:00 4 mL via INTRAVENOUS

## 2021-05-16 MED ORDER — ATORVASTATIN CALCIUM 10 MG PO TABS
10.0000 mg | ORAL_TABLET | Freq: Every day | ORAL | Status: DC
  Administered 2021-05-16: 17:00:00 10 mg via ORAL
  Filled 2021-05-16: qty 1

## 2021-05-16 MED ORDER — ATORVASTATIN CALCIUM 10 MG PO TABS
20.0000 mg | ORAL_TABLET | Freq: Every day | ORAL | Status: DC
  Administered 2021-05-17: 09:00:00 20 mg via ORAL
  Filled 2021-05-16: qty 2

## 2021-05-16 MED ORDER — STROKE: EARLY STAGES OF RECOVERY BOOK
Freq: Once | Status: AC
  Filled 2021-05-16: qty 1

## 2021-05-16 MED ORDER — POTASSIUM CHLORIDE CRYS ER 20 MEQ PO TBCR
40.0000 meq | EXTENDED_RELEASE_TABLET | Freq: Once | ORAL | Status: AC
  Administered 2021-05-16: 14:00:00 40 meq via ORAL
  Filled 2021-05-16: qty 2

## 2021-05-16 NOTE — TOC Progression Note (Signed)
Transition of Care [facility]) - Progression Note    Patient Details  Name: Sarah [name] MRN: [id number] Date of Birth: 02-May-1933  Transition of Care [facility]) CM/SW Contact  Zenon Mayo, RN Phone Number: 05/16/2021, 4:18 PM  Clinical Narrative:     Transition of Care [facility]) Screening Note   Patient Details  Name: Sarah [name] Date of Birth: [birth year]   Transition of Care [facility]) CM/SW Contact:    Zenon Mayo, RN Phone Number: 05/16/2021, 4:18 PM    Transition of Care Department [facility]) has reviewed patient and no TOC needs have been identified at this time. We will continue to monitor patient advancement through interdisciplinary progression rounds. If new patient transition needs arise, please place a TOC consult.          Expected Discharge Plan and Services                                                 Social Determinants of Health (SDOH) Interventions    Readmission Risk Interventions No flowsheet data found.

## 2021-05-16 NOTE — Evaluation (Signed)
Occupational Therapy Evaluation Patient Details Name: Sarah [name] MRN: [id number] DOB: 04/16/1933 Today's Date: 05/16/2021   History of Present Illness Pt is a 87 y/o female admitted 12/25 with headache and hypertension. CT head with 2 MM aneursym- neurosurgery recommended no intervention at this time. MRI showing PRES and acute cortical punctate stroke in right parietal lobe. PMH includes: DVT, PE on eliquis, HTN, anxiety, breast CA, immunoblastic T cell lymphoma, glaucoma.   Clinical Impression   PTA patient independent using RW for mobility, ADLs.  She was admitted for above and presenting with problem list below, including generalized weakness, impaired balance, decreased activity tolerance.  She currently requires min assist for transfers, min guard for mobility in room and up to min assist for ADLs.  She follows commands but demonstrating difficulty with multiple step commands, awareness, safety and recall during session.  Daughter present and reports she will provide initial 24/7 support once dc'd home.  Will follow acutely and recommend continued HHOT services at dc.      Recommendations for follow up therapy are one component of a multi-disciplinary discharge planning process, led by the attending physician.  Recommendations may be updated based on patient status, additional functional criteria and insurance authorization.   Follow Up Recommendations  Home health OT    Assistance Recommended at Discharge Frequent or constant Supervision/Assistance  Functional Status Assessment  Patient has had a recent decline in their functional status and demonstrates the ability to make significant improvements in function in a reasonable and predictable amount of time.  Equipment Recommendations  None recommended by OT    Recommendations for Other Services PT consult     Precautions / Restrictions Precautions Precautions: Fall Restrictions Weight Bearing Restrictions: No       Mobility Bed Mobility Overal bed mobility: Needs Assistance Bed Mobility: Supine to Sit     Supine to sit: Min assist          Transfers                          Balance Overall balance assessment: Needs assistance Sitting-balance support: No upper extremity supported;Feet supported Sitting balance-Leahy Scale: Fair     Standing balance support: Bilateral upper extremity supported;During functional activity Standing balance-Leahy Scale: Poor Standing balance comment: relies on RW                           ADL either performed or assessed with clinical judgement   ADL Overall ADL's : Needs assistance/impaired Eating/Feeding: Set up;Sitting   Grooming: Set up;Sitting           Upper Body Dressing : Sitting;Minimal assistance   Lower Body Dressing: Minimal assistance;Sit to/from stand Lower Body Dressing Details (indicate cue type and reason): donning slippers with supervision seated, min assist in standing Toilet Transfer: Minimal assistance;Ambulation;Rolling walker (2 wheels)   Toileting- Clothing Manipulation and Hygiene: Minimal assistance;Sit to/from stand       Functional mobility during ADLs: Min guard;Rolling walker (2 wheels)       Vision Baseline Vision/History: 1 Wears glasses Ability to See in Adequate Light: 1 Impaired Patient Visual Report:  (pt unsure bc she doens't have her glasses) Vision Assessment?: No apparent visual deficits     Perception     Praxis      Pertinent Vitals/Pain Pain Assessment: No/denies pain     Hand Dominance Right   Extremity/Trunk Assessment Upper Extremity Assessment Upper Extremity Assessment:  Generalized weakness   Lower Extremity Assessment Lower Extremity Assessment: Defer to PT evaluation       Communication Communication Communication: No difficulties   Cognition Arousal/Alertness: Awake/alert Behavior During Therapy: WFL for tasks assessed/performed Overall Cognitive  Status: Impaired/Different from baseline Area of Impairment: Following commands;Problem solving;Awareness;Memory                     Memory: Decreased short-term memory Following Commands: Follows one step commands consistently;Follows one step commands with increased time;Follows multi-step commands inconsistently   Awareness: Emergent Problem Solving: Slow processing;Requires verbal cues;Difficulty sequencing General Comments: able to follow commands and oriented, daughter reports changes but unsure if it is medication related.  Requires cueing to recall hand placement during transfers.     General Comments  educated on RW accessories to increase safety with carrying items, daughter plans to look into this.    Exercises     Shoulder Instructions      Home Living Family/patient expects to be discharged to:: Private residence (independent living facility- [facility]) Living Arrangements: Alone   Type of Home: Apartment Home Access: Elevator (3rd)     Home Layout: One level     Bathroom Shower/Tub: Hospital doctor Toilet: Handicapped height     Home Equipment: Conservation officer, nature (2 wheels);Shower seat - built in;Grab bars - toilet;Grab bars - tub/shower;Other (comment) (lift chair)   Additional Comments: daughter plan to stay with pt initally to provide 24/7      Prior Functioning/Environment Prior Level of Function : Independent/Modified Independent;History of Falls (last six months)             Mobility Comments: using RW, walks to dining hall ADLs Comments: daughter assist with medication and showers, facility cleans, meals at dining hall vs light cooking in apartment        OT Problem List: Decreased strength;Decreased activity tolerance;Impaired balance (sitting and/or standing);Decreased cognition;Decreased safety awareness;Decreased knowledge of use of DME or AE;Decreased knowledge of precautions      OT Treatment/Interventions: Self-care/ADL  training;Therapeutic exercise;DME and/or AE instruction;Therapeutic activities;Patient/family education;Balance training;Cognitive remediation/compensation    OT Goals(Current goals can be found in the care plan section) Acute Rehab OT Goals Patient Stated Goal: back home OT Goal Formulation: With patient Time For Goal Achievement: 05/30/21 Potential to Achieve Goals: Good  OT Frequency: Min 2X/week   Barriers to D/C:            Co-evaluation              AM-PAC OT "6 Clicks" Daily Activity     Outcome Measure Help from another person eating meals?: A Little Help from another person taking care of personal grooming?: A Little Help from another person toileting, which includes using toliet, bedpan, or urinal?: A Little Help from another person bathing (including washing, rinsing, drying)?: A Little Help from another person to put on and taking off regular upper body clothing?: A Little Help from another person to put on and taking off regular lower body clothing?: A Little 6 Click Score: 18   End of Session Equipment Utilized During Treatment: Rolling walker (2 wheels) Nurse Communication: Mobility status  Activity Tolerance: Patient tolerated treatment well Patient left: in chair;with call bell/phone within reach;with family/visitor present (daughter declined chair alarm and RN aware)  OT Visit Diagnosis: Other abnormalities of gait and mobility (R26.89);Muscle weakness (generalized) (M62.81);History of falling (Z91.81);Other symptoms and signs involving cognitive function  Time: 5001-6429 OT Time Calculation (min): 40 min Charges:  OT General Charges $OT Visit: 1 Visit OT Evaluation $OT Eval Moderate Complexity: 1 Mod OT Treatments $Self Care/Home Management : 23-37 mins  Jolaine Artist, OT Acute Rehabilitation Services Pager [phone] Office [phone]   Sarah [name] 05/16/2021, 10:15 AM

## 2021-05-16 NOTE — Evaluation (Signed)
Physical Therapy Evaluation Patient Details Name: Sarah [name] MRN: [id number] DOB: 04/15/1933 Today's Date: 05/16/2021  History of Present Illness  87 y/o female was admitted 12/25 with headache and hypertension. CT head with 2 MM aneursym- neurosurgery recommended no intervention at this time. MRI showing PRES and acute cortical punctate stroke in right parietal lobe. PMHx: DVT, PE on eliquis, HTN, anxiety, breast CA, immunoblastic T cell lymphoma, glaucoma.  Clinical Impression  Pt was admitted with HA, found to have acute punctate stroke R parietal lobe with brain edema and 72mm aneurysm, all non-operative.  Pt is demonstrating a limitation of strength on L ankle particularly, struggling to clear foot with gait fatigue due to combined hip weakness as well.  Pt has a tendency to speed up a bit with fatigue to get to the end of the walk, and talked with her about taking her time and not tiring LLE as much.   Will recommend HHPT since this is the plan with OT and family, but could possibly benefit from CIR as well given her prior independence with gait.  Follow for acute PT goals and may update her discharge recommendations based on progress.  Daughter is planning to take her home for now.        Recommendations for follow up therapy are one component of a multi-disciplinary discharge planning process, led by the attending physician.  Recommendations may be updated based on patient status, additional functional criteria and insurance authorization.  Follow Up Recommendations Home health PT    Assistance Recommended at Discharge Frequent or constant Supervision/Assistance  Functional Status Assessment Patient has had a recent decline in their functional status and demonstrates the ability to make significant improvements in function in a reasonable and predictable amount of time.  Equipment Recommendations  None recommended by PT    Recommendations for Other Services       Precautions /  Restrictions Precautions Precautions: Fall Precaution Comments: monitor vitals Restrictions Weight Bearing Restrictions: No      Mobility  Bed Mobility Overal bed mobility: Needs Assistance Bed Mobility: Supine to Sit     Supine to sit: Min assist     General bed mobility comments: min assist to support trunk and to get scooted to EOB    Transfers Overall transfer level: Needs assistance Equipment used: Rolling walker (2 wheels);1 person hand held assist Transfers: Sit to/from Stand Sit to Stand: Min assist           General transfer comment: repetitive cues for hand placement every trial and to control standing balance initially.    Ambulation/Gait Ambulation/Gait assistance: Min guard;Min assist Gait Distance (Feet): 100 Feet Assistive device: Rolling walker (2 wheels);1 person hand held assist Gait Pattern/deviations: Step-to pattern;Step-through pattern;Decreased stride length;Decreased dorsiflexion - left;Wide base of support;Decreased weight shift to left Gait velocity: reduced Gait velocity interpretation: <1.8 ft/sec, indicate of risk for recurrent falls Pre-gait activities: standing balance and postural control General Gait Details: pt is sliding LLE as she fatigues, DF weakness and reduction in L hip strength combine to make foot clearance a small issue  Stairs            Wheelchair Mobility    Modified Rankin (Stroke Patients Only)       Balance Overall balance assessment: Needs assistance Sitting-balance support: Feet supported Sitting balance-Leahy Scale: Fair       Standing balance-Leahy Scale: Poor Standing balance comment: UE support on RW  Pertinent Vitals/Pain Pain Assessment: Faces Faces Pain Scale: No hurt    Home Living Family/patient expects to be discharged to:: [place] residence ([place]) Living Arrangements: Alone Available Help at Discharge: Family;Friend(s);Available  PRN/intermittently Type of Home: Apartment Home Access: Elevator       Home Layout: One level Home Equipment: Conservation officer, nature (2 wheels);Shower seat - built in;Grab bars - toilet;Grab bars - tub/shower;Other (comment) Additional Comments: daughter is going home with her initially    Prior Function Prior Level of Function : Independent/Modified Independent;History of Falls (last six months)             Mobility Comments: wallked to meals on RW       Hand Dominance   Dominant Hand: Right    Extremity/Trunk Assessment   Upper Extremity Assessment Upper Extremity Assessment: Defer to OT evaluation    Lower Extremity Assessment Lower Extremity Assessment: LLE deficits/detail LLE Deficits / Details: weakness mostly of L ankle DF LLE Coordination: decreased gross motor    Cervical / Trunk Assessment Cervical / Trunk Assessment: Kyphotic  Communication   Communication: No difficulties  Cognition Arousal/Alertness: Lethargic Behavior During Therapy: Flat affect Overall Cognitive Status: Impaired/Different from baseline Area of Impairment: Safety/judgement;Following commands;Attention                   Current Attention Level: Selective Memory: Decreased short-term memory Following Commands: Follows one step commands inconsistently;Follows one step commands with increased time Safety/Judgement: Decreased awareness of deficits;Decreased awareness of safety   Problem Solving: Slow processing;Decreased initiation;Difficulty sequencing;Requires verbal cues;Requires tactile cues General Comments: repetitive instructions for gait safety, specifically obstacle clearance and maneuvering of walker        General Comments General comments (skin integrity, edema, etc.): Pt is noted to maneuver around obstacles with pt trying to lift and move walker unsafely.  Talked with her about slowing down and being careful with maneuvering, tends to be a bit lethargic but had some  ativan today    Exercises     Assessment/Plan    PT Assessment Patient needs continued PT services  PT Problem List Decreased strength;Decreased range of motion;Decreased activity tolerance;Decreased balance;Decreased mobility;Decreased coordination;Decreased safety awareness;Cardiopulmonary status limiting activity       PT Treatment Interventions DME instruction;Gait training;Functional mobility training;Therapeutic activities;Therapeutic exercise;Balance training;Neuromuscular re-education;Patient/family education    PT Goals (Current goals can be found in the Care Plan section)  Acute Rehab PT Goals Patient Stated Goal: to get stronger and get home PT Goal Formulation: With patient/family Time For Goal Achievement: 05/30/21 Potential to Achieve Goals: Good    Frequency Min 4X/week   Barriers to discharge Decreased caregiver support home in accessible environment with pt having had help from family already    Co-evaluation               AM-PAC PT "6 Clicks" Mobility  Outcome Measure Help needed turning from your back to your side while in a flat bed without using bedrails?: A Little Help needed moving from lying on your back to sitting on the side of a flat bed without using bedrails?: A Little Help needed moving to and from a bed to a chair (including a wheelchair)?: A Little Help needed standing up from a chair using your arms (e.g., wheelchair or bedside chair)?: A Little Help needed to walk in hospital room?: A Little Help needed climbing 3-5 steps with a railing? : Total 6 Click Score: 16    End of Session Equipment Utilized During Treatment: Gait belt Activity  Tolerance: Patient limited by fatigue;Treatment limited secondary to medical complications (Comment) Patient left: in chair;with call bell/phone within reach;with chair alarm set;with family/visitor present Nurse Communication: Mobility status PT Visit Diagnosis: Unsteadiness on feet (R26.81);Muscle  weakness (generalized) (M62.81);Difficulty in walking, not elsewhere classified (R26.2);Hemiplegia and hemiparesis Hemiplegia - Right/Left: Left Hemiplegia - dominant/non-dominant: Non-dominant Hemiplegia - caused by: Cerebral infarction    Time: 1211-1245 PT Time Calculation (min) (ACUTE ONLY): 34 min   Charges:   PT Evaluation $PT Eval Moderate Complexity: 1 Mod PT Treatments $Gait Training: 8-22 mins       Ramond Dial 05/16/2021, 5:34 PM  Mee Hives, PT PhD Acute Rehab Dept. Number: [place] [facility] [place]

## 2021-05-16 NOTE — Progress Notes (Signed)
°  Echocardiogram 2D Echocardiogram has been performed.  Merrie Roof F 05/16/2021, 1:29 PM

## 2021-05-16 NOTE — Plan of Care (Signed)
Neurology note brief:   Spoke to stroke MD who felt the stroke was incidental and not part of her HA. HA is decreased, so no reason for LP at this time. We will check MRI brain with contrast due to the one side affect of PRES on MRI without.   Clance Boll, MSN, APN-BC Neurology Nurse Practitioner Pager [phone]   Plan discussed with Dr. Erlinda Hong and Dr. Theda Sers who approve.

## 2021-05-16 NOTE — Progress Notes (Signed)
PROGRESS NOTE        PATIENT DETAILS Name: Sarah [name] Age: 87 y.o. Sex: female Date of Birth: 1932-11-04 Admit Date: 05/14/2021 Admitting Physician Evalee Mutton Kristeen Mans, MD VOH:[id number], Hal, MD  Brief Narrative: Patient is a 87 y.o. female with history of T-cell lymphoma in remission, history of breast cancer-in remission, VTE on Eliquis-presented with intractable headache-upon further evaluation-thought to have PRES in the setting of uncontrolled hypertension.  Subjective: Lying comfortably in bed-headache is significantly improved.  Objective: Vitals: Blood pressure (!) 161/86, pulse 71, temperature 97.8 F (36.6 C), temperature source Axillary, resp. rate 18, height 5\' 4"  (1.626 m), weight 45.4 kg, SpO2 99 %.   Exam: Gen Exam:Alert awake-not in any distress HEENT:atraumatic, normocephalic Chest: B/L clear to auscultation anteriorly CVS:S1S2 regular Abdomen:soft non tender, non distended Extremities:no edema Neurology: Non focal Skin: no rash  Pertinent Labs/Radiology: Recent Labs  Lab 05/16/21 0805  WBC 5.6  HGB 13.9  PLT 141*  NA 140  K 3.2*  CREATININE 0.70     Assessment/Plan: Intractable headache likely due to PRES in the setting of uncontrolled hypertension: Headache improved-BP better.  Neurology planning to repeat MRI with contrast today.  Will await further recommendations from neurology.  HTN: BP controlled better-continue hydralazine and Coreg-watch closely and adjust accordingly.  Hypokalemia: Replete and recheck.  Punctate acute infarct in the right parietal lobe: Likely incidental finding-doubt any clinical significance.  A1c 5.2, LDL 98, CTA head/neck without any significant stenosis-awaiting echo and further recommendations from neurology.  2 mm right ICA aneurysm: Likely incidental finding-await further recommendations from neurology.  History of VTE: On Eliquis  History of T-cell lymphoma: On remission-follow  with oncology in the outpatient setting.  History of breast cancer in 2013: In remission.  BMI Estimated body mass index is 17.16 kg/m as calculated from the following:   Height as of this encounter: 5\' 4"  (1.626 m).   Weight as of this encounter: 45.4 kg.    Procedures: None Consults: Neurology DVT Prophylaxis: Eliquis Code Status: DNR Family Communication: None at bedside  Time spent: 25- minutes-Greater than 50% of this time was spent in counseling, explanation of diagnosis, planning of further management, and coordination of care.   Disposition Plan: Status is: Inpatient  Remains inpatient appropriate because: MRI with contrast-echo-PT/OT eval before consideration of discharge-likely discharge on 12/27 once work-up has been completed    Diet: Diet Order             Diet Heart Room service appropriate? Yes; Fluid consistency: Thin  Diet effective now                     Antimicrobial agents: Anti-infectives (From admission, onward)    None        MEDICATIONS: Scheduled Meds:  apixaban  2.5 mg Oral BID   carvedilol  6.25 mg Oral BID WC   Chlorhexidine Gluconate Cloth  6 each Topical Daily   cholecalciferol  2,000 Units Oral BID   citalopram  5 mg Oral Daily   hydrALAZINE  50 mg Oral Q8H   levothyroxine  25 mcg Oral Q0600   multivitamin  1 tablet Oral BID   sodium chloride  1 application Both Eyes QHS   sodium chloride flush  3 mL Intravenous Q12H   Continuous Infusions:  sodium chloride     PRN Meds:.sodium chloride, acetaminophen **  OR** acetaminophen, hydrALAZINE, lidocaine-prilocaine, Lifitegrast, LORazepam, ondansetron **OR** ondansetron (ZOFRAN) IV, polyethylene glycol, polyvinyl alcohol, sodium chloride flush, zolpidem   I have personally reviewed following labs and imaging studies  LABORATORY DATA: CBC: Recent Labs  Lab 05/14/21 2045 05/16/21 0805  WBC 4.5 5.6  NEUTROABS 3.2  --   HGB 14.3 13.9  HCT 44.7 42.8  MCV 91.8 91.3   PLT 157 141*    Basic Metabolic Panel: Recent Labs  Lab 05/14/21 2045 05/16/21 0805  NA 143 140  K 3.7 3.2*  CL 105 105  CO2 30 26  GLUCOSE 88 95  BUN 24* 29*  CREATININE 0.57 0.70  CALCIUM 9.8 9.1    GFR: Estimated Creatinine Clearance: 36.2 mL/min (by C-G formula based on SCr of 0.7 mg/dL).  Liver Function Tests: No results for input(s): AST, ALT, ALKPHOS, BILITOT, PROT, ALBUMIN in the last 168 hours. No results for input(s): LIPASE, AMYLASE in the last 168 hours. No results for input(s): AMMONIA in the last 168 hours.  Coagulation Profile: No results for input(s): INR, PROTIME in the last 168 hours.  Cardiac Enzymes: No results for input(s): CKTOTAL, CKMB, CKMBINDEX, TROPONINI in the last 168 hours.  BNP (last 3 results) No results for input(s): PROBNP in the last 8760 hours.  Lipid Profile: Recent Labs    05/16/21 0805  CHOL 189  HDL 76  LDLCALC 98  TRIG 75  CHOLHDL 2.5    Thyroid Function Tests: Recent Labs    05/16/21 0805  TSH 1.610    Anemia Panel: No results for input(s): VITAMINB12, FOLATE, FERRITIN, TIBC, IRON, RETICCTPCT in the last 72 hours.  Urine analysis:    Component Value Date/Time   COLORURINE YELLOW 05/14/2021 2300   APPEARANCEUR CLEAR 05/14/2021 2300   LABSPEC 1.013 05/14/2021 2300   PHURINE 7.0 05/14/2021 2300   GLUCOSEU NEGATIVE 05/14/2021 2300   HGBUR NEGATIVE 05/14/2021 2300   BILIRUBINUR NEGATIVE 05/14/2021 2300   KETONESUR NEGATIVE 05/14/2021 2300   PROTEINUR TRACE (A) 05/14/2021 2300   UROBILINOGEN 0.2 04/09/2012 1202   NITRITE NEGATIVE 05/14/2021 2300   LEUKOCYTESUR TRACE (A) 05/14/2021 2300    Sepsis Labs: Lactic Acid, Venous    Component Value Date/Time   LATICACIDVEN 0.8 12/31/2019 0104    MICROBIOLOGY: Recent Results (from the past 240 hour(s))  Resp Panel by RT-PCR (Flu A&B, Covid) Nasopharyngeal Swab     Status: None   Collection Time: 05/15/21 10:46 AM   Specimen: Nasopharyngeal Swab;  Nasopharyngeal(NP) swabs in vial transport medium  Result Value Ref Range Status   SARS Coronavirus 2 by RT PCR NEGATIVE NEGATIVE Final    Comment: (NOTE) SARS-CoV-2 target nucleic acids are NOT DETECTED.  The SARS-CoV-2 RNA is generally detectable in upper respiratory specimens during the acute phase of infection. The lowest concentration of SARS-CoV-2 viral copies this assay can detect is 138 copies/mL. A negative result does not preclude SARS-Cov-2 infection and should not be used as the sole basis for treatment or other patient management decisions. A negative result may occur with  improper specimen collection/handling, submission of specimen other than nasopharyngeal swab, presence of viral mutation(s) within the areas targeted by this assay, and inadequate number of viral copies(<138 copies/mL). A negative result must be combined with clinical observations, patient history, and epidemiological information. The expected result is Negative.  Fact Sheet for Patients:  EntrepreneurPulse.com.au  Fact Sheet for Healthcare Providers:  IncredibleEmployment.be  This test is no t yet approved or cleared by the [place] FDA and  has been  authorized for detection and/or diagnosis of SARS-CoV-2 by FDA under an Emergency Use Authorization (EUA). This EUA will remain  in effect (meaning this test can be used) for the duration of the COVID-19 declaration under Section 564(b)(1) of the Act, 21 U.S.C.section 360bbb-3(b)(1), unless the authorization is terminated  or revoked sooner.       Influenza A by PCR NEGATIVE NEGATIVE Final   Influenza B by PCR NEGATIVE NEGATIVE Final    Comment: (NOTE) The Xpert Xpress SARS-CoV-2/FLU/RSV plus assay is intended as an aid in the diagnosis of influenza from Nasopharyngeal swab specimens and should not be used as a sole basis for treatment. Nasal washings and aspirates are unacceptable for Xpert Xpress  SARS-CoV-2/FLU/RSV testing.  Fact Sheet for Patients: EntrepreneurPulse.com.au  Fact Sheet for Healthcare Providers: IncredibleEmployment.be  This test is not yet approved or cleared by the [place] FDA and has been authorized for detection and/or diagnosis of SARS-CoV-2 by FDA under an Emergency Use Authorization (EUA). This EUA will remain in effect (meaning this test can be used) for the duration of the COVID-19 declaration under Section 564(b)(1) of the Act, 21 U.S.C. section 360bbb-3(b)(1), unless the authorization is terminated or revoked.  Performed at [facility], [place] [address] [place], [place] [address]     RADIOLOGY STUDIES/RESULTS: CT ANGIO HEAD NECK W WO CM  Result Date: 05/14/2021 CLINICAL DATA:  Thunderclap headache EXAM: CT ANGIOGRAPHY HEAD AND NECK TECHNIQUE: Multidetector CT imaging of the head and neck was performed using the standard protocol during bolus administration of intravenous contrast. Multiplanar CT image reconstructions and MIPs were obtained to evaluate the vascular anatomy. Carotid stenosis measurements (when applicable) are obtained utilizing NASCET criteria, using the distal internal carotid diameter as the denominator. CONTRAST:  179mL OMNIPAQUE IOHEXOL 350 MG/ML SOLN COMPARISON:  Head CT same day FINDINGS: CTA NECK FINDINGS SKELETON: There is no bony spinal canal stenosis. No lytic or blastic lesion. OTHER NECK: Normal pharynx, larynx and major salivary glands. No cervical lymphadenopathy. Unremarkable thyroid gland. UPPER CHEST: Biapical scarring. Mild tree-in-bud opacity at the posterior right upper lobe AORTIC ARCH: There is calcific atherosclerosis of the aortic arch. There is no aneurysm, dissection or hemodynamically significant stenosis of the visualized portion of the aorta. Normal 3 vessel aortic branching pattern. The visualized proximal subclavian arteries are widely patent. RIGHT CAROTID SYSTEM:  No dissection, occlusion or aneurysm. Minimal atherosclerotic calcification at the carotid bifurcation without hemodynamically significant stenosis. LEFT CAROTID SYSTEM: No dissection, occlusion or aneurysm. Minimal atherosclerotic calcification at the carotid bifurcation without hemodynamically significant stenosis. VERTEBRAL ARTERIES: Left dominant configuration. Both origins are clearly patent. There is no dissection, occlusion or flow-limiting stenosis to the skull base (V1-V3 segments). CTA HEAD FINDINGS POSTERIOR CIRCULATION: --Vertebral arteries: Normal V4 segments. --Inferior cerebellar arteries: Normal. --Basilar artery: Normal. --Superior cerebellar arteries: Normal. --Posterior cerebral arteries (PCA): Normal. ANTERIOR CIRCULATION: --Intracranial internal carotid arteries: There is a 2 mm aneurysm projecting inferiorly and medially from the clinoid segment of the right internal carotid artery (series 7, image 110 and series 10, image 134). The internal carotid arteries at the skull base are otherwise unremarkable. --Anterior cerebral arteries (ACA): Normal. Both A1 segments are present. Patent anterior communicating artery (a-comm). --Middle cerebral arteries (MCA): Normal. VENOUS SINUSES: As permitted by contrast timing, patent. ANATOMIC VARIANTS: None Review of the MIP images confirms the above findings. IMPRESSION: 1. No emergent large vessel occlusion or high-grade stenosis of the intracranial arteries. 2. There is a 2 mm aneurysm projecting inferiorly and medially from the  clinoid segment of the right internal carotid artery. 3. Mild tree-in-bud opacity at the posterior right upper lobe could indicate endobronchial infection. Aortic Atherosclerosis (ICD10-I70.0). Electronically Signed   By: Ulyses Jarred M.D.   On: 05/14/2021 22:10   CT Head Wo Contrast  Result Date: 05/14/2021 CLINICAL DATA:  Provided history: Headache, new or worsening. Additional history provided: Patient reports pain behind  eyes, elevated blood pressure. EXAM: CT HEAD WITHOUT CONTRAST TECHNIQUE: Contiguous axial images were obtained from the base of the skull through the vertex without intravenous contrast. COMPARISON:  Structures 03/23/2021. FINDINGS: Brain: Mild generalized cerebral and cerebellar atrophy. At least moderate patchy and confluent hypoattenuation within the cerebral white matter, nonspecific but compatible with chronic small vessel ischemic disease. There is no acute intracranial hemorrhage. No demarcated cortical infarct. No extra-axial fluid collection. No evidence of an intracranial mass. No midline shift. Vascular: No hyperdense vessel. Atherosclerotic calcifications. Skull: Normal. Negative for fracture or focal lesion. Sinuses/Orbits: Visualized orbits show no acute finding. No significant paranasal sinus disease at the imaged levels. Redemonstrated fairly symmetric discoid/polypoid soft tissue within the olfactory clefts of the nasal cavities. IMPRESSION: No evidence of acute intracranial abnormality. At least moderate chronic small vessel ischemic changes within the cerebral white matter. Mild generalized parenchymal atrophy. Fairly symmetric discoid/polypoid soft tissue within the olfactory clefts of the nasal cavities. These may reflect polyps or sinonasal respiratory epithelial adenomatoid hamartoma (REAH). Electronically Signed   By: Kellie Simmering D.O.   On: 05/14/2021 20:01   MR BRAIN WO CONTRAST  Result Date: 05/15/2021 CLINICAL DATA:  87 year old female with severe sudden headache. Nausea. Hypertensive at presentation (196/116). But afebrile (97 F). History of lymphoma. EXAM: MRI HEAD WITHOUT CONTRAST TECHNIQUE: Multiplanar, multiecho pulse sequences of the brain and surrounding structures were obtained without intravenous contrast. COMPARISON:  CTA head and neck and plain head CT yesterday. FINDINGS: Brain: Punctate focus of restricted diffusion in the right parietal cortex on series 2, image 32.  However, much larger area of abnormal edema in the nearby postcentral gyrus (series 6, image 23 and series 250, image 34) and also throughout the left mesial temporal lobe (series 6, image 12) are facilitated on diffusion. Gyral T2 FLAIR hyperintensity and enlargement in those areas, affecting both gray and white matter. Decreased gyral T1 signal. No evidence of associated hemorrhage. No significant mass effect. No involvement in the left hemisphere. Elsewhere there is patchy and confluent T2 and FLAIR hyperintensity in the bilateral cerebral white matter and deep gray matter nuclei compatible with chronic small vessel disease. Similar patchy heterogeneity in the pons. Chronic microhemorrhage in the right deep cerebellar nuclei on series 7, image 26. No other chronic cerebral blood products. No cortical encephalomalacia identified. No midline shift, mass effect, ventriculomegaly, extra-axial collection or acute intracranial hemorrhage. Cervicomedullary junction and pituitary are within normal limits. Vascular: Major intracranial vascular flow voids are preserved. Skull and upper cervical spine: Negative for age visible cervical spine. Visualized bone marrow signal is within normal limits. Sinuses/Orbits: Postoperative changes to both globes, otherwise negative orbits. Minor paranasal sinus mucosal thickening. Other: Mastoids are clear. Grossly normal visible internal auditory structures. IMPRESSION: 1. Confluent gyral edema affecting both gray and white matter in the right mesial temporal lobe, and also the right parietal lobe postcentral gyrus. No associated hemorrhage or mass effect, and diffusion in those gyri is facilitated. Lack of fever and confusion argue against Herpes Encephalitis. And this may be an unusual unilateral presentation of posterior reversible encephalopathy syndrome (PRES) given the degree of hypertension.  Other nonspecific Encephalitis is possible. The appearance is NOT typical of CNS  lymphoma. 2. However, there is also evidence of a punctate cortical infarct in the right parietal lobe on series 2, image 32. No other acute ischemia identified. 3. No other acute intracranial abnormality. Moderate for age underlying chronic signal changes compatible with small vessel disease. Electronically Signed   By: Genevie Ann M.D.   On: 05/15/2021 06:58   CT VENOGRAM HEAD  Result Date: 05/15/2021 CLINICAL DATA:  87 year old female with severe sudden headache. Nausea. Hypertensive at presentation (196/116). But afebrile (97 F). Abnormal History of lymphoma.  Brain MRI without contrast this morning. EXAM: CT VENOGRAM HEAD TECHNIQUE: Venographic phase images of the brain were obtained following the administration of intravenous contrast. Multiplanar reformats and maximum intensity projections were generated. CONTRAST:  62mL OMNIPAQUE IOHEXOL 350 MG/ML SOLN COMPARISON:  Brain MRI 0622 hours. CT head without contrast 05/14/2021. CTA head and neck 05/14/2021. FINDINGS: Venous sinuses: Fairly early venous contrast timing on the CTA yesterday. Good intravenous contrast opacification now. Superior sagittal sinus, torcula, straight sinus, vein of Galen and internal cerebral veins are enhancing and patent. Bilateral transverse and sigmoid sinuses are enhancing and patent. Bilateral IJ bulbs are patent. Cavernous sinus is enhancing and appears normal. The major cortical draining veins also appear to be enhancing and patent. Major intracranial arterial structures are enhancing and appear to be patent. Other findings: No enhancement of the abnormal mesial right temporal and right parietal gyri demonstrating CT hypodensity in keeping with the edema on MRI. No intracranial mass effect. No ventriculomegaly. Gray-white matter differentiation elsewhere appears stable since 05/14/2021. Review of the MIP images confirms the above findings IMPRESSION: 1. Normal intracranial CTV. No evidence of dural venous thrombosis. 2. No CT  enhancement of the edematous mesial right temporal and right parietal gyri. 3. No new intracranial abnormality identified. Electronically Signed   By: Genevie Ann M.D.   On: 05/15/2021 08:39     LOS: 1 day   Oren Binet, MD  [facility]    To contact the attending provider between 7A-7P or the covering provider during after hours 7P-7A, please log into the web site [email or web address] and access using universal [place] password for that web site. If you do not have the password, please call the hospital operator.  05/16/2021, 11:35 AM

## 2021-05-17 ENCOUNTER — Other Ambulatory Visit (HOSPITAL_COMMUNITY): Payer: Self-pay

## 2021-05-17 DIAGNOSIS — I6330 Cerebral infarction due to thrombosis of unspecified cerebral artery: Secondary | ICD-10-CM

## 2021-05-17 DIAGNOSIS — E7800 Pure hypercholesterolemia, unspecified: Secondary | ICD-10-CM

## 2021-05-17 LAB — BASIC METABOLIC PANEL
Anion gap: 6 (ref 5–15)
BUN: 32 mg/dL — ABNORMAL HIGH (ref 8–23)
CO2: 28 mmol/L (ref 22–32)
Calcium: 9.1 mg/dL (ref 8.9–10.3)
Chloride: 106 mmol/L (ref 98–111)
Creatinine, Ser: 0.67 mg/dL (ref 0.44–1.00)
GFR, Estimated: >60 mL/min (ref >60)
Glucose, Bld: 91 mg/dL (ref 70–99)
Potassium: 4 mmol/L (ref 3.5–5.1)
Sodium: 140 mmol/L (ref 135–145)

## 2021-05-17 MED ORDER — HYDRALAZINE HCL 50 MG PO TABS
50.0000 mg | ORAL_TABLET | Freq: Three times a day (TID) | ORAL | 3 refills | Status: DC
  Filled 2021-05-17: qty 90, 30d supply, fill #0

## 2021-05-17 MED ORDER — CARVEDILOL 12.5 MG PO TABS
12.5000 mg | ORAL_TABLET | Freq: Two times a day (BID) | ORAL | 3 refills | Status: DC
  Filled 2021-05-17: qty 60, 30d supply, fill #0

## 2021-05-17 MED ORDER — HEPARIN SOD (PORK) LOCK FLUSH 100 UNIT/ML IV SOLN
500.0000 [IU] | INTRAVENOUS | Status: AC | PRN
  Administered 2021-05-17: 15:00:00 500 [IU]
  Filled 2021-05-17: qty 5

## 2021-05-17 MED ORDER — CARVEDILOL 12.5 MG PO TABS
12.5000 mg | ORAL_TABLET | Freq: Two times a day (BID) | ORAL | Status: DC
  Administered 2021-05-17: 09:00:00 12.5 mg via ORAL
  Filled 2021-05-17: qty 1

## 2021-05-17 MED ORDER — ATORVASTATIN CALCIUM 20 MG PO TABS
20.0000 mg | ORAL_TABLET | Freq: Every day | ORAL | 3 refills | Status: DC
  Filled 2021-05-17: qty 30, 30d supply, fill #0

## 2021-05-17 NOTE — Progress Notes (Signed)
PT Cancellation Note  Patient Details Name: Sarah [name] MRN: [id number] DOB: 03-20-1933   Cancelled Treatment:    Reason Eval/Treat Not Completed: Other (comment).  Pt is refusing therapy since she is expecting to leave.  Had talked with OT about CIR request, but pt is now going home with daughter.  Follow up as her stay permits.   Ramond Dial 05/17/2021, 1:25 PM  Mee Hives, PT PhD Acute Rehab Dept. Number: [place] [facility] [place]

## 2021-05-17 NOTE — Discharge Summary (Addendum)
PATIENT DETAILS Name: Sarah [name] Age: 87 y.o. Sex: female Date of Birth: 1933/01/04 MRN: [id number]. Admitting Physician: Jonetta Osgood, MD LEX:[id number], Christiane Ha, MD  Admit Date: 05/14/2021 Discharge date: 05/17/2021  Recommendations for Outpatient Follow-up:  Follow up with PCP in 1-2 weeks Please obtain CMP/CBC in one week Continue outpatient optimization of antihypertensive regimen-if blood pressures are to control-PCP to consider initiation of work-up for secondary causes of hypertension.   Admitted From:  Home  Disposition: Home with home health services  Mariano Colon: Yes  Equipment/Devices: None  Discharge Condition: Stable  CODE STATUS: FULL CODE  Diet recommendation:  Diet Order             Diet - low sodium heart healthy           Diet Heart Room service appropriate? Yes; Fluid consistency: Thin  Diet effective now                    Brief Summary: Patient is a 87 y.o. female with history of T-cell lymphoma in remission, history of breast cancer-in remission, VTE on Eliquis-presented with intractable headache-upon further evaluation-thought to have PRES in the setting of uncontrolled hypertension.  Brief Hospital Course: Intractable headache likely due to PRES in the setting of uncontrolled hypertension: Resolved-blood pressure much better-discussed with neurologist-Dr. Johnsie Cancel to discharge-no further recommendations.     HTN: BP was higher earlier this morning-I have adjusted dosing of Coreg-plans are to continue with current dosing of Coreg/hydralazine and have patient follow with primary care MD for further optimization.  If blood pressure is hard to control-PCP to consider work-up/evaluation for secondary causes of hypertension.     Hypokalemia: Repleted   Punctate acute infarct in the right parietal lobe: Likely incidental finding-doubt any clinical significance.  A1c 5.2, LDL 98, CTA head/neck without any significant stenosis,  stable echo-discussed with neurology-Dr. Xu-continue statin on discharge.   2 mm right ICA aneurysm: Likely incidental finding-stable for follow-up with Dr. Kathyrn Sheriff per neurology.  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  History of breast cancer in 2013: In remission.   BMI Estimated body mass index is 17.16 kg/m as calculated from the following:   Height as of this encounter: 5\' 4"  (1.626 m).   Weight as of this encounter: 45.4 kg   Procedures None  Discharge Diagnoses:  Principal Problem:   Headache Active Problems:   Cerebral thrombosis with cerebral infarction   Discharge Instructions:  Activity:  As tolerated  Discharge Instructions     Ambulatory referral to Neurology   Complete by: As directed    An appointment is requested in approximately: 8 weeks   Call MD for:  severe uncontrolled pain   Complete by: As directed    Diet - low sodium heart healthy   Complete by: As directed    Discharge instructions   Complete by: As directed    Follow with Primary MD  Lajean Manes, MD in 1-2 weeks  Please get a complete blood count and chemistry panel checked by your Primary MD at your next visit, and again as instructed by your Primary MD.  Get Medicines reviewed and adjusted: Please take all your medications with you for your next visit with your Primary MD  Laboratory/radiological data: Please request your Primary MD to go over all hospital tests and procedure/radiological results at the follow up, please ask your Primary MD to get all Hospital records sent  to his/her office.  In some cases, they will be blood work, cultures and biopsy results pending at the time of your discharge. Please request that your primary care M.D. follows up on these results.  Also Note the following: If you experience worsening of your admission symptoms, develop shortness of breath, life threatening  emergency, suicidal or homicidal thoughts you must seek medical attention immediately by calling 911 or calling your MD immediately  if symptoms less severe.  You must read complete instructions/literature along with all the possible adverse reactions/side effects for all the Medicines you take and that have been prescribed to you. Take any new Medicines after you have completely understood and accpet all the possible adverse reactions/side effects.   Do not drive when taking Pain medications or sleeping medications (Benzodaizepines)  Do not take more than prescribed Pain, Sleep and Anxiety Medications. It is not advisable to combine anxiety,sleep and pain medications without talking with your primary care practitioner  Special Instructions: If you have smoked or chewed Tobacco  in the last 2 yrs please stop smoking, stop any regular Alcohol  and or any Recreational drug use.  Wear Seat belts while driving.  Please note: You were cared for by a hospitalist during your hospital stay. Once you are discharged, your primary care physician will handle any further medical issues. Please note that NO REFILLS for any discharge medications will be authorized once you are discharged, as it is imperative that you return to your primary care physician (or establish a relationship with a primary care physician if you do not have one) for your post hospital discharge needs so that they can reassess your need for medications and monitor your lab values.   1.  Incidental finding-2 mm small right ICA aneurysm-we will need follow-up with neurosurgery-Dr. Kathyrn Sheriff.   Increase activity slowly   Complete by: As directed       Allergies as of 05/17/2021       Reactions   Citalopram Anxiety   Alendronate Sodium Other (See Comments)   Amoxicillin-pot Clavulanate Other (See Comments)   Codeine Other (See Comments)   Reaction not recalled   Latex    Lisinopril Other (See Comments)   Septra  [sulfamethoxazole-trimethoprim] Other (See Comments)   Thiazide-type Diuretics Other (See Comments)   Blurry vision?? (patient stated she has macular degeneration)        Medication List     STOP taking these medications    clonazepam 0.125 MG disintegrating tablet Commonly known as: KLONOPIN   escitalopram 10 MG tablet Commonly known as: LEXAPRO   gabapentin 100 MG capsule Commonly known as: Neurontin   levofloxacin 500 MG tablet Commonly known as: Levaquin   LORazepam 0.5 MG tablet Commonly known as: ATIVAN       TAKE these medications    acetaminophen 500 MG tablet Commonly known as: TYLENOL Take 1,000 mg by mouth every 6 (six) hours as needed for moderate pain.   atorvastatin 20 MG tablet Commonly known as: LIPITOR Take 1 tablet (20 mg total) by mouth daily. Start taking on: May 18, 2021   carvedilol 12.5 MG tablet Commonly known as: COREG Take 1 tablet (12.5 mg total) by mouth 2 (two) times daily with a meal.   citalopram 10 MG tablet Commonly known as: CELEXA Take 5 mg by mouth daily.   Eliquis 2.5 MG Tabs tablet Generic drug: apixaban TAKE 1 TABLET TWICE A DAY What changed: how much to take   hydrALAZINE 50 MG tablet Commonly known  as: APRESOLINE Take 1 tablet (50 mg total) by mouth 3 (three) times daily. What changed:  medication strength how much to take   levothyroxine 25 MCG tablet Commonly known as: SYNTHROID Take 25 mcg by mouth daily before breakfast.   lidocaine-prilocaine cream Commonly known as: EMLA Apply to affected area once What changed:  how much to take how to take this when to take this additional instructions   ondansetron 8 MG tablet Commonly known as: Zofran Take 1 tablet (8 mg total) by mouth 2 (two) times daily as needed. Start on the third day after chemotherapy.   PRESERVISION AREDS PO Take 1 capsule by mouth 2 (two) times daily.   sodium chloride 5 % ophthalmic ointment Commonly known as: MURO  300 Place 1 application into both eyes at bedtime.   SUPER B COMPLEX PO Take 1 tablet by mouth daily.   Systane 0.4-0.3 % Gel ophthalmic gel Generic drug: Polyethyl Glycol-Propyl Glycol Place 1 application into both eyes daily as needed (dry eyes).   Vitamin D 50 MCG (2000 UT) tablet Take 2,000 Units by mouth 2 (two) times daily.   Xiidra 5 % Soln Generic drug: Lifitegrast Place 1 drop into both eyes daily as needed (dry eyes).   zolpidem 5 MG tablet Commonly known as: AMBIEN Take 5 mg by mouth at bedtime.        Follow-up Information     Stoneking, Hal, MD Follow up in 1 week(s).   Specialty: Internal Medicine Contact information: [address] [place] [address] [phone]         Consuella Lose, MD .   Specialty: Neurosurgery Why: for aneursym follow up Contact information: [address] [address] [phone]         [facility] Follow up.   Why: Office will call with date/time, If you dont hear from them,please give them a call Contact information: [address]     [address] [place] [address] [phone]               Allergies  Allergen Reactions   Citalopram Anxiety   Alendronate Sodium Other (See Comments)   Amoxicillin-Pot Clavulanate Other (See Comments)   Codeine Other (See Comments)    Reaction not recalled   Latex    Lisinopril Other (See Comments)   Septra [Sulfamethoxazole-Trimethoprim] Other (See Comments)   Thiazide-Type Diuretics Other (See Comments)    Blurry vision?? (patient stated she has macular degeneration)      Consultations:  neurology   Other Procedures/Studies: CT ANGIO HEAD NECK W WO CM  Result Date: 05/14/2021 CLINICAL DATA:  Thunderclap headache EXAM: CT ANGIOGRAPHY HEAD AND NECK TECHNIQUE: Multidetector CT imaging of the head and neck was performed using the standard protocol during bolus administration  of intravenous contrast. Multiplanar CT image reconstructions and MIPs were obtained to evaluate the vascular anatomy. Carotid stenosis measurements (when applicable) are obtained utilizing NASCET criteria, using the distal internal carotid diameter as the denominator. CONTRAST:  115mL OMNIPAQUE IOHEXOL 350 MG/ML SOLN COMPARISON:  Head CT same day FINDINGS: CTA NECK FINDINGS SKELETON: There is no bony spinal canal stenosis. No lytic or blastic lesion. OTHER NECK: Normal pharynx, larynx and major salivary glands. No cervical lymphadenopathy. Unremarkable thyroid gland. UPPER CHEST: Biapical scarring. Mild tree-in-bud opacity at the posterior right upper lobe AORTIC ARCH: There is calcific atherosclerosis of the aortic arch. There is no aneurysm, dissection or hemodynamically significant stenosis of the visualized portion  of the aorta. Normal 3 vessel aortic branching pattern. The visualized proximal subclavian arteries are widely patent. RIGHT CAROTID SYSTEM: No dissection, occlusion or aneurysm. Minimal atherosclerotic calcification at the carotid bifurcation without hemodynamically significant stenosis. LEFT CAROTID SYSTEM: No dissection, occlusion or aneurysm. Minimal atherosclerotic calcification at the carotid bifurcation without hemodynamically significant stenosis. VERTEBRAL ARTERIES: Left dominant configuration. Both origins are clearly patent. There is no dissection, occlusion or flow-limiting stenosis to the skull base (V1-V3 segments). CTA HEAD FINDINGS POSTERIOR CIRCULATION: --Vertebral arteries: Normal V4 segments. --Inferior cerebellar arteries: Normal. --Basilar artery: Normal. --Superior cerebellar arteries: Normal. --Posterior cerebral arteries (PCA): Normal. ANTERIOR CIRCULATION: --Intracranial internal carotid arteries: There is a 2 mm aneurysm projecting inferiorly and medially from the clinoid segment of the right internal carotid artery (series 7, image 110 and series 10, image 134). The  internal carotid arteries at the skull base are otherwise unremarkable. --Anterior cerebral arteries (ACA): Normal. Both A1 segments are present. Patent anterior communicating artery (a-comm). --Middle cerebral arteries (MCA): Normal. VENOUS SINUSES: As permitted by contrast timing, patent. ANATOMIC VARIANTS: None Review of the MIP images confirms the above findings. IMPRESSION: 1. No emergent large vessel occlusion or high-grade stenosis of the intracranial arteries. 2. There is a 2 mm aneurysm projecting inferiorly and medially from the clinoid segment of the right internal carotid artery. 3. Mild tree-in-bud opacity at the posterior right upper lobe could indicate endobronchial infection. Aortic Atherosclerosis (ICD10-I70.0). Electronically Signed   By: Ulyses Jarred M.D.   On: 05/14/2021 22:10   CT Head Wo Contrast  Result Date: 05/14/2021 CLINICAL DATA:  Provided history: Headache, new or worsening. Additional history provided: Patient reports pain behind eyes, elevated blood pressure. EXAM: CT HEAD WITHOUT CONTRAST TECHNIQUE: Contiguous axial images were obtained from the base of the skull through the vertex without intravenous contrast. COMPARISON:  Structures 03/23/2021. FINDINGS: Brain: Mild generalized cerebral and cerebellar atrophy. At least moderate patchy and confluent hypoattenuation within the cerebral white matter, nonspecific but compatible with chronic small vessel ischemic disease. There is no acute intracranial hemorrhage. No demarcated cortical infarct. No extra-axial fluid collection. No evidence of an intracranial mass. No midline shift. Vascular: No hyperdense vessel. Atherosclerotic calcifications. Skull: Normal. Negative for fracture or focal lesion. Sinuses/Orbits: Visualized orbits show no acute finding. No significant paranasal sinus disease at the imaged levels. Redemonstrated fairly symmetric discoid/polypoid soft tissue within the olfactory clefts of the nasal cavities.  IMPRESSION: No evidence of acute intracranial abnormality. At least moderate chronic small vessel ischemic changes within the cerebral white matter. Mild generalized parenchymal atrophy. Fairly symmetric discoid/polypoid soft tissue within the olfactory clefts of the nasal cavities. These may reflect polyps or sinonasal respiratory epithelial adenomatoid hamartoma (REAH). Electronically Signed   By: Kellie Simmering D.O.   On: 05/14/2021 20:01   MR BRAIN WO CONTRAST  Result Date: 05/15/2021 CLINICAL DATA:  87 year old female with severe sudden headache. Nausea. Hypertensive at presentation (196/116). But afebrile (97 F). History of lymphoma. EXAM: MRI HEAD WITHOUT CONTRAST TECHNIQUE: Multiplanar, multiecho pulse sequences of the brain and surrounding structures were obtained without intravenous contrast. COMPARISON:  CTA head and neck and plain head CT yesterday. FINDINGS: Brain: Punctate focus of restricted diffusion in the right parietal cortex on series 2, image 32. However, much larger area of abnormal edema in the nearby postcentral gyrus (series 6, image 23 and series 250, image 34) and also throughout the left mesial temporal lobe (series 6, image 12) are facilitated on diffusion. Gyral T2 FLAIR hyperintensity and enlargement in those areas,  affecting both gray and white matter. Decreased gyral T1 signal. No evidence of associated hemorrhage. No significant mass effect. No involvement in the left hemisphere. Elsewhere there is patchy and confluent T2 and FLAIR hyperintensity in the bilateral cerebral white matter and deep gray matter nuclei compatible with chronic small vessel disease. Similar patchy heterogeneity in the pons. Chronic microhemorrhage in the right deep cerebellar nuclei on series 7, image 26. No other chronic cerebral blood products. No cortical encephalomalacia identified. No midline shift, mass effect, ventriculomegaly, extra-axial collection or acute intracranial hemorrhage.  Cervicomedullary junction and pituitary are within normal limits. Vascular: Major intracranial vascular flow voids are preserved. Skull and upper cervical spine: Negative for age visible cervical spine. Visualized bone marrow signal is within normal limits. Sinuses/Orbits: Postoperative changes to both globes, otherwise negative orbits. Minor paranasal sinus mucosal thickening. Other: Mastoids are clear. Grossly normal visible internal auditory structures. IMPRESSION: 1. Confluent gyral edema affecting both gray and white matter in the right mesial temporal lobe, and also the right parietal lobe postcentral gyrus. No associated hemorrhage or mass effect, and diffusion in those gyri is facilitated. Lack of fever and confusion argue against Herpes Encephalitis. And this may be an unusual unilateral presentation of posterior reversible encephalopathy syndrome (PRES) given the degree of hypertension. Other nonspecific Encephalitis is possible. The appearance is NOT typical of CNS lymphoma. 2. However, there is also evidence of a punctate cortical infarct in the right parietal lobe on series 2, image 32. No other acute ischemia identified. 3. No other acute intracranial abnormality. Moderate for age underlying chronic signal changes compatible with small vessel disease. Electronically Signed   By: Genevie Ann M.D.   On: 05/15/2021 06:58   MR BRAIN W WO CONTRAST  Result Date: 05/16/2021 CLINICAL DATA:  Transient ischemic attack. Stroke. Neurological deficit, acute, stroke suspected. Headache. EXAM: MRI HEAD WITHOUT AND WITH CONTRAST TECHNIQUE: Multiplanar, multiecho pulse sequences of the brain and surrounding structures were obtained without and with intravenous contrast. CONTRAST:  59mL GADAVIST GADOBUTROL 1 MMOL/ML IV SOLN COMPARISON:  CT and MRI studies done over the last several days. CT as distant as 03/23/2021 FINDINGS: Brain: Diffusion imaging again demonstrates the punctate acute infarction affecting the cortical  surface in the right parietal lobe. A second punctate focus of restricted diffusion affects the cortical surface of the left parietal lobe. These are consistent with micro embolic infarctions. The left-sided abnormality may have been present previously but was less well seen due to slice positioning. No other new infarction. There is redemonstration of increased T2 and FLAIR signal with mild brain swelling the mesial temporal lobe on the right and a right parietal cortex and underlying white matter, possibly the post central gyrus. I do think there are areas of more subtle abnormality affecting the cortex in the left hemisphere as well, raising likelihood of the possibility of posterior reversible encephalopathy. Again today, there is no abnormal contrast enhancement or vasogenic edema. Therefore, acute infectious cerebritis remains on likely. Posterior reversible encephalopathy is probably most likely. The differential diagnosis would include nonenhancing residua related to previous CNS lymphoma versus multifocal low-grade glioma. Postictal foci would be expected to have changed somewhat and possibly be associated with contrast enhancement. Elsewhere, chronic small-vessel ischemic changes of the pons, thalami, basal ganglia and hemispheric white matter appear the same. Vascular: Major vessels at the base of the brain show flow. Skull and upper cervical spine: Negative Sinuses/Orbits: Clear/normal Other: None IMPRESSION: Redemonstration of punctate focus of restricted diffusion in the right parietal lobe  consistent with a micro embolic infarction. There is a second similar focus in the left parietal lobe consistent with a micro embolic infarction. This was not as well seen on the previous study done yesterday but may have been present and less apparent due to slight difference in slice positioning. Background pattern of chronic small vessel ischemic changes throughout the brain. Redemonstration of abnormal T2 and  FLAIR signal without contrast enhancement affecting the mesial temporal lobe on the right and a right parietal gyrus. Mild swelling of the structures. I think there are some other areas of cortical involvement in both hemispheres which are better seen today as today's study suffers from less motion degradation. This raises the possibility of posterior reversible encephalopathy as the diagnosis. See above for full discussion. Acute infectious cerebritis seems unlikely. Electronically Signed   By: Nelson Chimes M.D.   On: 05/16/2021 12:37   ECHOCARDIOGRAM COMPLETE  Result Date: 05/16/2021    ECHOCARDIOGRAM REPORT   Patient Name:   Sarah [name] Date of Exam: 05/16/2021 Medical Rec #:  [id number]        Height:       64.0 in Accession #:    [id number]       Weight:       100.0 lb Date of Birth:  1932-10-09        BSA:          1.457 m Patient Age:    36 years         BP:           141/74 mmHg Patient Gender: F                HR:           65 bpm. Exam Location:  Inpatient Procedure: 2D Echo, Cardiac Doppler and Color Doppler Indications:    Stroke  History:        Patient has prior history of Echocardiogram examinations, most                 recent 04/07/2019. Risk Factors:Hypertension and Dyslipidemia.  Sonographer:    Merrie Roof RDCS Referring Phys: [place]  1. Left ventricular ejection fraction, by estimation, is 55 to 60%. The left ventricle has normal function. The left ventricle has no regional wall motion abnormalities. There is mild left ventricular hypertrophy. Left ventricular diastolic parameters are indeterminate.  2. Right ventricular systolic function is normal. The right ventricular size is mildly enlarged. There is normal pulmonary artery systolic pressure.  3. Left atrial size was severely dilated.  4. Right atrial size was severely dilated.  5. The mitral valve is normal in structure. Trivial mitral valve regurgitation. No evidence of mitral stenosis.  6. Tricuspid  valve regurgitation is mild to moderate.  7. The aortic valve is normal in structure. Aortic valve regurgitation is trivial. No aortic stenosis is present.  8. Aortic dilatation noted. There is borderline dilatation of the ascending aorta, measuring 39 mm.  9. The inferior vena cava is dilated in size with >50% respiratory variability, suggesting right atrial pressure of 8 mmHg. FINDINGS  Left Ventricle: Left ventricular ejection fraction, by estimation, is 55 to 60%. The left ventricle has normal function. The left ventricle has no regional wall motion abnormalities. The left ventricular internal cavity size was normal in size. There is  mild left ventricular hypertrophy. Left ventricular diastolic parameters are indeterminate. Right Ventricle: The right ventricular size is mildly enlarged. Right ventricular systolic function is  normal. There is normal pulmonary artery systolic pressure. The tricuspid regurgitant velocity is 2.34 m/s, and with an assumed right atrial pressure of 8 mmHg, the estimated right ventricular systolic pressure is 44.0 mmHg. Left Atrium: Left atrial size was severely dilated. Right Atrium: Right atrial size was severely dilated. Pericardium: Trivial pericardial effusion is present. Mitral Valve: The mitral valve is normal in structure. Trivial mitral valve regurgitation. No evidence of mitral valve stenosis. Tricuspid Valve: The tricuspid valve is normal in structure. Tricuspid valve regurgitation is mild to moderate. No evidence of tricuspid stenosis. Aortic Valve: The aortic valve is normal in structure. Aortic valve regurgitation is trivial. No aortic stenosis is present. Aortic valve mean gradient measures 4.0 mmHg. Aortic valve peak gradient measures 6.8 mmHg. Aortic valve area, by VTI measures 1.76 cm. Pulmonic Valve: The pulmonic valve was normal in structure. Pulmonic valve regurgitation is not visualized. No evidence of pulmonic stenosis. Aorta: The aortic root is normal in size  and structure and aortic dilatation noted. There is borderline dilatation of the ascending aorta, measuring 39 mm. Venous: The inferior vena cava is dilated in size with greater than 50% respiratory variability, suggesting right atrial pressure of 8 mmHg. IAS/Shunts: No atrial level shunt detected by color flow Doppler.  LEFT VENTRICLE PLAX 2D LVIDd:         3.50 cm LVIDs:         2.30 cm LV PW:         1.00 cm LV IVS:        1.20 cm LVOT diam:     1.80 cm LV SV:         49 LV SV Index:   34 LVOT Area:     2.54 cm  RIGHT VENTRICLE RV Basal diam:  4.30 cm RV Mid diam:    3.40 cm LEFT ATRIUM             Index        RIGHT ATRIUM           Index LA diam:        2.40 cm 1.65 cm/m   RA Area:     23.80 cm LA Vol (A2C):   62.5 ml 42.89 ml/m  RA Volume:   73.10 ml  50.17 ml/m LA Vol (A4C):   67.2 ml 46.12 ml/m LA Biplane Vol: 66.2 ml 45.43 ml/m  AORTIC VALVE AV Area (Vmax):    1.80 cm AV Area (Vmean):   1.82 cm AV Area (VTI):     1.76 cm AV Vmax:           130.00 cm/s AV Vmean:          87.300 cm/s AV VTI:            0.281 m AV Peak Grad:      6.8 mmHg AV Mean Grad:      4.0 mmHg LVOT Vmax:         92.20 cm/s LVOT Vmean:        62.400 cm/s LVOT VTI:          0.194 m LVOT/AV VTI ratio: 0.69  AORTA Ao Root diam: 3.60 cm Ao Asc diam:  3.90 cm TRICUSPID VALVE TR Peak grad:   21.9 mmHg TR Vmax:        234.00 cm/s  SHUNTS Systemic VTI:  0.19 m Systemic Diam: 1.80 cm Kirk Ruths MD Electronically signed by Kirk Ruths MD Signature Date/Time: 05/16/2021/2:53:21 PM    Final  CT VENOGRAM HEAD  Result Date: 05/15/2021 CLINICAL DATA:  87 year old female with severe sudden headache. Nausea. Hypertensive at presentation (196/116). But afebrile (97 F). Abnormal History of lymphoma.  Brain MRI without contrast this morning. EXAM: CT VENOGRAM HEAD TECHNIQUE: Venographic phase images of the brain were obtained following the administration of intravenous contrast. Multiplanar reformats and maximum intensity projections  were generated. CONTRAST:  26mL OMNIPAQUE IOHEXOL 350 MG/ML SOLN COMPARISON:  Brain MRI 0622 hours. CT head without contrast 05/14/2021. CTA head and neck 05/14/2021. FINDINGS: Venous sinuses: Fairly early venous contrast timing on the CTA yesterday. Good intravenous contrast opacification now. Superior sagittal sinus, torcula, straight sinus, vein of Galen and internal cerebral veins are enhancing and patent. Bilateral transverse and sigmoid sinuses are enhancing and patent. Bilateral IJ bulbs are patent. Cavernous sinus is enhancing and appears normal. The major cortical draining veins also appear to be enhancing and patent. Major intracranial arterial structures are enhancing and appear to be patent. Other findings: No enhancement of the abnormal mesial right temporal and right parietal gyri demonstrating CT hypodensity in keeping with the edema on MRI. No intracranial mass effect. No ventriculomegaly. Gray-white matter differentiation elsewhere appears stable since 05/14/2021. Review of the MIP images confirms the above findings IMPRESSION: 1. Normal intracranial CTV. No evidence of dural venous thrombosis. 2. No CT enhancement of the edematous mesial right temporal and right parietal gyri. 3. No new intracranial abnormality identified. Electronically Signed   By: Genevie Ann M.D.   On: 05/15/2021 08:39     TODAY-DAY OF DISCHARGE:  Subjective:   Mammie Lorenzo today has no headache,no chest abdominal pain,no new weakness tingling or numbness, feels much better wants to go home today.   Objective:   Blood pressure 99/60, pulse 65, temperature 98.1 F (36.7 C), temperature source Axillary, resp. rate 17, height 5\' 4"  (1.626 m), weight 45.4 kg, SpO2 97 %.  Intake/Output Summary (Last 24 hours) at 05/17/2021 1205 Last data filed at 05/17/2021 0935 Gross per 24 hour  Intake 120 ml  Output --  Net 120 ml   Filed Weights   05/14/21 1737  Weight: 45.4 kg    Exam: Awake Alert, Oriented *3, No new  F.N deficits, Normal affect [place].AT,PERRAL Supple Neck,No JVD, No cervical lymphadenopathy appriciated.  Symmetrical Chest wall movement, Good air movement bilaterally, CTAB RRR,No Gallops,Rubs or new Murmurs, No Parasternal Heave +ve B.Sounds, Abd Soft, Non tender, No organomegaly appriciated, No rebound -guarding or rigidity. No Cyanosis, Clubbing or edema, No new Rash or bruise   PERTINENT RADIOLOGIC STUDIES: MR BRAIN W WO CONTRAST  Result Date: 05/16/2021 CLINICAL DATA:  Transient ischemic attack. Stroke. Neurological deficit, acute, stroke suspected. Headache. EXAM: MRI HEAD WITHOUT AND WITH CONTRAST TECHNIQUE: Multiplanar, multiecho pulse sequences of the brain and surrounding structures were obtained without and with intravenous contrast. CONTRAST:  76mL GADAVIST GADOBUTROL 1 MMOL/ML IV SOLN COMPARISON:  CT and MRI studies done over the last several days. CT as distant as 03/23/2021 FINDINGS: Brain: Diffusion imaging again demonstrates the punctate acute infarction affecting the cortical surface in the right parietal lobe. A second punctate focus of restricted diffusion affects the cortical surface of the left parietal lobe. These are consistent with micro embolic infarctions. The left-sided abnormality may have been present previously but was less well seen due to slice positioning. No other new infarction. There is redemonstration of increased T2 and FLAIR signal with mild brain swelling the mesial temporal lobe on the right and a right parietal cortex and underlying white matter,  possibly the post central gyrus. I do think there are areas of more subtle abnormality affecting the cortex in the left hemisphere as well, raising likelihood of the possibility of posterior reversible encephalopathy. Again today, there is no abnormal contrast enhancement or vasogenic edema. Therefore, acute infectious cerebritis remains on likely. Posterior reversible encephalopathy is probably most likely. 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 Postictal foci would be expected to have changed somewhat and possibly be associated with contrast enhancement. Elsewhere, chronic small-vessel ischemic changes of the pons, thalami, basal ganglia and hemispheric white matter appear the same. Vascular: Major vessels at the base of the brain show flow. Skull and upper cervical spine: Negative Sinuses/Orbits: Clear/normal Other: None IMPRESSION: Redemonstration of punctate focus of restricted diffusion in the right parietal lobe consistent with a micro embolic infarction. There is a second similar focus in the left parietal lobe consistent with a micro embolic infarction. This was not as well seen on the previous study done yesterday but may have been present and less apparent due to slight difference in slice positioning. Background pattern of chronic small vessel ischemic changes throughout the brain. Redemonstration of abnormal T2 and FLAIR signal without contrast enhancement affecting the mesial temporal lobe on the right and a right parietal gyrus. Mild swelling of the structures. I think there are some other areas of cortical involvement in both hemispheres which are better seen today as today's study suffers from less motion degradation. This raises the possibility of posterior reversible encephalopathy as the diagnosis. See above for full discussion. Acute infectious cerebritis seems unlikely. Electronically Signed   By: Nelson Chimes M.D.   On: 05/16/2021 12:37   ECHOCARDIOGRAM COMPLETE  Result Date: 05/16/2021    ECHOCARDIOGRAM REPORT   Patient Name:   Sarah [name] Date of Exam: 05/16/2021 Medical Rec #:  [id number]        Height:       64.0 in Accession #:    [id number]       Weight:       100.0 lb Date of Birth:  October 23, 1932        BSA:          1.457 m Patient Age:    43 years         BP:           141/74 mmHg Patient  Gender: F                HR:           65 bpm. Exam Location:  Inpatient Procedure: 2D Echo, Cardiac Doppler and Color Doppler Indications:    Stroke  History:        Patient has prior history of Echocardiogram examinations, most                 recent 04/07/2019. Risk Factors:Hypertension and Dyslipidemia.  Sonographer:    Merrie Roof RDCS Referring Phys: [place]  1. Left ventricular ejection fraction, by estimation, is 55 to 60%. The left ventricle has normal function. The left ventricle has no regional wall motion abnormalities. There is mild left ventricular hypertrophy. Left ventricular diastolic parameters are indeterminate.  2. Right ventricular systolic function is normal. The right ventricular size is mildly enlarged. There is normal pulmonary artery systolic pressure.  3. Left atrial size was severely dilated.  4. Right atrial size was severely dilated.  5. The mitral valve is normal  in structure. Trivial mitral valve regurgitation. No evidence of mitral stenosis.  6. Tricuspid valve regurgitation is mild to moderate.  7. The aortic valve is normal in structure. Aortic valve regurgitation is trivial. No aortic stenosis is present.  8. Aortic dilatation noted. There is borderline dilatation of the ascending aorta, measuring 39 mm.  9. The inferior vena cava is dilated in size with >50% respiratory variability, suggesting right atrial pressure of 8 mmHg. FINDINGS  Left Ventricle: Left ventricular ejection fraction, by estimation, is 55 to 60%. The left ventricle has normal function. The left ventricle has no regional wall motion abnormalities. The left ventricular internal cavity size was normal in size. There is  mild left ventricular hypertrophy. Left ventricular diastolic parameters are indeterminate. Right Ventricle: The right ventricular size is mildly enlarged. Right ventricular systolic function is normal. There is normal pulmonary artery systolic pressure. The tricuspid  regurgitant velocity is 2.34 m/s, and with an assumed right atrial pressure of 8 mmHg, the estimated right ventricular systolic pressure is 82.4 mmHg. Left Atrium: Left atrial size was severely dilated. Right Atrium: Right atrial size was severely dilated. Pericardium: Trivial pericardial effusion is present. Mitral Valve: The mitral valve is normal in structure. Trivial mitral valve regurgitation. No evidence of mitral valve stenosis. Tricuspid Valve: The tricuspid valve is normal in structure. Tricuspid valve regurgitation is mild to moderate. No evidence of tricuspid stenosis. Aortic Valve: The aortic valve is normal in structure. Aortic valve regurgitation is trivial. No aortic stenosis is present. Aortic valve mean gradient measures 4.0 mmHg. Aortic valve peak gradient measures 6.8 mmHg. Aortic valve area, by VTI measures 1.76 cm. Pulmonic Valve: The pulmonic valve was normal in structure. Pulmonic valve regurgitation is not visualized. No evidence of pulmonic stenosis. Aorta: The aortic root is normal in size and structure and aortic dilatation noted. There is borderline dilatation of the ascending aorta, measuring 39 mm. Venous: The inferior vena cava is dilated in size with greater than 50% respiratory variability, suggesting right atrial pressure of 8 mmHg. IAS/Shunts: No atrial level shunt detected by color flow Doppler.  LEFT VENTRICLE PLAX 2D LVIDd:         3.50 cm LVIDs:         2.30 cm LV PW:         1.00 cm LV IVS:        1.20 cm LVOT diam:     1.80 cm LV SV:         49 LV SV Index:   34 LVOT Area:     2.54 cm  RIGHT VENTRICLE RV Basal diam:  4.30 cm RV Mid diam:    3.40 cm LEFT ATRIUM             Index        RIGHT ATRIUM           Index LA diam:        2.40 cm 1.65 cm/m   RA Area:     23.80 cm LA Vol (A2C):   62.5 ml 42.89 ml/m  RA Volume:   73.10 ml  50.17 ml/m LA Vol (A4C):   67.2 ml 46.12 ml/m LA Biplane Vol: 66.2 ml 45.43 ml/m  AORTIC VALVE AV Area (Vmax):    1.80 cm AV Area (Vmean):    1.82 cm AV Area (VTI):     1.76 cm AV Vmax:           130.00 cm/s AV Vmean:  87.300 cm/s AV VTI:            0.281 m AV Peak Grad:      6.8 mmHg AV Mean Grad:      4.0 mmHg LVOT Vmax:         92.20 cm/s LVOT Vmean:        62.400 cm/s LVOT VTI:          0.194 m LVOT/AV VTI ratio: 0.69  AORTA Ao Root diam: 3.60 cm Ao Asc diam:  3.90 cm TRICUSPID VALVE TR Peak grad:   21.9 mmHg TR Vmax:        234.00 cm/s  SHUNTS Systemic VTI:  0.19 m Systemic Diam: 1.80 cm Kirk Ruths MD Electronically signed by Kirk Ruths MD Signature Date/Time: 05/16/2021/2:53:21 PM    Final      PERTINENT LAB RESULTS: CBC: Recent Labs    05/14/21 2045 05/16/21 0805  WBC 4.5 5.6  HGB 14.3 13.9  HCT 44.7 42.8  PLT 157 141*   CMET CMP     Component Value Date/Time   NA 140 05/17/2021 0500   NA 141 07/25/2016 1053   K 4.0 05/17/2021 0500   K 3.6 07/25/2016 1053   CL 106 05/17/2021 0500   CL 106 04/25/2012 1606   CO2 28 05/17/2021 0500   CO2 27 07/25/2016 1053   GLUCOSE 91 05/17/2021 0500   GLUCOSE 102 07/25/2016 1053   GLUCOSE 114 (H) 04/25/2012 1606   BUN 32 (H) 05/17/2021 0500   BUN 15.5 07/25/2016 1053   CREATININE 0.67 05/17/2021 0500   CREATININE 0.78 04/07/2021 1328   CREATININE 0.8 07/25/2016 1053   CALCIUM 9.1 05/17/2021 0500   CALCIUM 10.1 07/25/2016 1053   PROT 6.4 (L) 05/04/2021 1815   PROT 7.5 07/25/2016 1053   ALBUMIN 4.1 05/04/2021 1815   ALBUMIN 4.3 07/25/2016 1053   AST 15 05/04/2021 1815   AST 16 04/07/2021 1328   AST 17 07/25/2016 1053   ALT 12 05/04/2021 1815   ALT 12 04/07/2021 1328   ALT 11 07/25/2016 1053   ALKPHOS 58 05/04/2021 1815   ALKPHOS 99 07/25/2016 1053   BILITOT 0.5 05/04/2021 1815   BILITOT 0.5 04/07/2021 1328   BILITOT 0.61 07/25/2016 1053   GFRNONAA >60 05/17/2021 0500   GFRNONAA >60 04/07/2021 1328   GFRAA >60 02/10/2020 0855    GFR Estimated Creatinine Clearance: 36.2 mL/min (by C-G formula based on SCr of 0.67 mg/dL). No results for input(s):  LIPASE, AMYLASE in the last 72 hours. No results for input(s): CKTOTAL, CKMB, CKMBINDEX, TROPONINI in the last 72 hours. Invalid input(s): POCBNP No results for input(s): DDIMER in the last 72 hours. Recent Labs    05/16/21 0805  HGBA1C 5.2   Recent Labs    05/16/21 0805  CHOL 189  HDL 76  LDLCALC 98  TRIG 75  CHOLHDL 2.5   Recent Labs    05/16/21 0805  TSH 1.610   No results for input(s): VITAMINB12, FOLATE, FERRITIN, TIBC, IRON, RETICCTPCT in the last 72 hours. Coags: No results for input(s): INR in the last 72 hours.  Invalid input(s): PT Microbiology: Recent Results (from the past 240 hour(s))  Resp Panel by RT-PCR (Flu A&B, Covid) Nasopharyngeal Swab     Status: None   Collection Time: 05/15/21 10:46 AM   Specimen: Nasopharyngeal Swab; Nasopharyngeal(NP) swabs in vial transport medium  Result Value Ref Range Status   SARS Coronavirus 2 by RT PCR NEGATIVE NEGATIVE Final    Comment: (NOTE)  SARS-CoV-2 target nucleic acids are NOT DETECTED.  The SARS-CoV-2 RNA is generally detectable in upper respiratory specimens during the acute phase of infection. The lowest concentration of SARS-CoV-2 viral copies this assay can detect is 138 copies/mL. A negative result does not preclude SARS-Cov-2 infection and should not be used as the sole basis for treatment or other patient management decisions. A negative result may occur with  improper specimen collection/handling, submission of specimen other than nasopharyngeal swab, presence of viral mutation(s) within the areas targeted by this assay, and inadequate number of viral copies(<138 copies/mL). A negative result must be combined with clinical observations, patient history, and epidemiological information. The expected result is Negative.  Fact Sheet for Patients:  EntrepreneurPulse.com.au  Fact Sheet for Healthcare Providers:  IncredibleEmployment.be  This test is no t yet approved  or cleared by the [place] FDA and  has been authorized for detection and/or diagnosis of SARS-CoV-2 by FDA under an Emergency Use Authorization (EUA). This EUA will remain  in effect (meaning this test can be used) for the duration of the COVID-19 declaration under Section 564(b)(1) of the Act, 21 U.S.C.section 360bbb-3(b)(1), unless the authorization is terminated  or revoked sooner.       Influenza A by PCR NEGATIVE NEGATIVE Final   Influenza B by PCR NEGATIVE NEGATIVE Final    Comment: (NOTE) The Xpert Xpress SARS-CoV-2/FLU/RSV plus assay is intended as an aid in the diagnosis of influenza from Nasopharyngeal swab specimens and should not be used as a sole basis for treatment. Nasal washings and aspirates are unacceptable for Xpert Xpress SARS-CoV-2/FLU/RSV testing.  Fact Sheet for Patients: EntrepreneurPulse.com.au  Fact Sheet for Healthcare Providers: IncredibleEmployment.be  This test is not yet approved or cleared by the [place] FDA and has been authorized for detection and/or diagnosis of SARS-CoV-2 by FDA under an Emergency Use Authorization (EUA). This EUA will remain in effect (meaning this test can be used) for the duration of the COVID-19 declaration under Section 564(b)(1) of the Act, 21 U.S.C. section 360bbb-3(b)(1), unless the authorization is terminated or revoked.  Performed at [facility], [place] [address], [place], [place] [address]     FURTHER DISCHARGE INSTRUCTIONS:  Get Medicines reviewed and adjusted: Please take all your medications with you for your next visit with your Primary MD  Laboratory/radiological data: Please request your Primary MD to go over all hospital tests and procedure/radiological results at the follow up, please ask your Primary MD to get all Hospital records sent to his/her office.  In some cases, they will be blood work, cultures and biopsy results pending at the time of  your discharge. Please request that your primary care M.D. goes through all the records of your hospital data and follows up on these results.  Also Note the following: If you experience worsening of your admission symptoms, develop shortness of breath, life threatening emergency, suicidal or homicidal thoughts you must seek medical attention immediately by calling 911 or calling your MD immediately  if symptoms less severe.  You must read complete instructions/literature along with all the possible adverse reactions/side effects for all the Medicines you take and that have been prescribed to you. Take any new Medicines after you have completely understood and accpet all the possible adverse reactions/side effects.   Do not drive when taking Pain medications or sleeping medications (Benzodaizepines)  Do not take more than prescribed Pain, Sleep and Anxiety Medications. It is not advisable to combine anxiety,sleep and pain medications without talking with your  primary care practitioner  Special Instructions: If you have smoked or chewed Tobacco  in the last 2 yrs please stop smoking, stop any regular Alcohol  and or any Recreational drug use.  Wear Seat belts while driving.  Please note: You were cared for by a hospitalist during your hospital stay. Once you are discharged, your primary care physician will handle any further medical issues. Please note that NO REFILLS for any discharge medications will be authorized once you are discharged, as it is imperative that you return to your primary care physician (or establish a relationship with a primary care physician if you do not have one) for your post hospital discharge needs so that they can reassess your need for medications and monitor your lab values.  Total Time spent coordinating discharge including counseling, education and face to face time equals 35 minutes.  SignedOren Binet 05/17/2021 12:05 PM

## 2021-05-17 NOTE — Progress Notes (Addendum)
Occupational Therapy Treatment Patient Details Name: Sarah [name] MRN: [id number] DOB: 1932-10-22 Today's Date: 05/17/2021   History of present illness 87 y/o female was admitted 12/25 with headache and hypertension. CT head with 2 MM aneursym- neurosurgery recommended no intervention at this time. MRI showing PRES and acute cortical punctate stroke in right parietal lobe. PMHx: DVT, PE on eliquis, HTN, anxiety, breast CA, immunoblastic T cell lymphoma, glaucoma.   OT comments  Pt progressing well towards acute OT goals. Pt continues to need up to min A with functional transfers, min guard to stand at sink to complete 2 grooming tasks. Pt also completed toilet transfer, pericare and walked about 59' in the hall. Daughter present throughout session. Updating d/c recommendation to [facility] for more intensive therapy prior to d/c home.   BP supine at start of session: 106/60 (previous bp reading earlier this morning of 178/77) BP EOB 106/60 BP at end of session after OOB activity 99/60   Recommendations for follow up therapy are one component of a multi-disciplinary discharge planning process, led by the attending physician.  Recommendations may be updated based on patient status, additional functional criteria and insurance authorization.    Follow Up Recommendations  Acute inpatient rehab (3hours/day)    Assistance Recommended at Discharge Frequent or constant Supervision/Assistance  Equipment Recommendations  None recommended by OT    Recommendations for Other Services Rehab consult    Precautions / Restrictions Precautions Precautions: Fall Precaution Comments: monitor vitals Restrictions Weight Bearing Restrictions: No       Mobility Bed Mobility Overal bed mobility: Needs Assistance Bed Mobility: Supine to Sit     Supine to sit: Min assist     General bed mobility comments: extra time and effort. assist to advance hips to full EOB position.    Transfers Overall  transfer level: Needs assistance Equipment used: Rolling walker (2 wheels) Transfers: Sit to/from Stand Sit to Stand: Min assist           General transfer comment: to/from elevated EOB height, 3n1 over toilet, standard guest chair and recliner. Min A to powerup. cues for hand placement     Balance Overall balance assessment: Needs assistance Sitting-balance support: Feet supported Sitting balance-Leahy Scale: Fair     Standing balance support: Bilateral upper extremity supported;During functional activity Standing balance-Leahy Scale: Poor Standing balance comment: needs external support in standing. utilized sink for external support during grooming tasks                           ADL either performed or assessed with clinical judgement   ADL Overall ADL's : Needs assistance/impaired     Grooming: Oral care;Wash/dry face;Min guard;Supervision/safety;Standing Grooming Details (indicate cue type and reason): initially ssupervision fatigued to min guard. seated rest break after grooming session before walking in the hall                 Toilet Transfer: Minimal assistance;Ambulation;Rolling walker (2 wheels)   Toileting- Clothing Manipulation and Hygiene: Minimal assistance;Sit to/from stand Toileting - Clothing Manipulation Details (indicate cue type and reason): steadying assist. Pt able to adjust undergarment with extra time, effort and min steadying     Functional mobility during ADLs: Min guard;Rolling walker (2 wheels) General ADL Comments: Pt completed bed mobility, walked to bathroom for toilet transfer and pericare. Stood to complete 2 grooming tasks in standing. Seated rest break then walked about 32' in the hall.    Extremity/Trunk Assessment Upper Extremity  Assessment Upper Extremity Assessment: Generalized weakness   Lower Extremity Assessment Lower Extremity Assessment: Defer to PT evaluation        Vision       Perception      Praxis      Cognition Arousal/Alertness: Awake/alert Behavior During Therapy: Flat affect Overall Cognitive Status: Impaired/Different from baseline Area of Impairment: Safety/judgement;Following commands;Attention                   Current Attention Level: Selective Memory: Decreased short-term memory Following Commands: Follows one step commands inconsistently;Follows one step commands with increased time Safety/Judgement: Decreased awareness of deficits;Decreased awareness of safety Awareness: Emergent Problem Solving: Slow processing;Decreased initiation;Difficulty sequencing;Requires verbal cues;Requires tactile cues            Exercises     Shoulder Instructions       General Comments daughter present and involved throughout session.    Pertinent Vitals/ Pain       Pain Assessment: No/denies pain  Home Living                                          Prior Functioning/Environment              Frequency  Min 2X/week        Progress Toward Goals  OT Goals(current goals can now be found in the care plan section)  Progress towards OT goals: Progressing toward goals  Acute Rehab OT Goals Patient Stated Goal: home OT Goal Formulation: With patient Time For Goal Achievement: 05/30/21 Potential to Achieve Goals: Good ADL Goals Pt Will Perform Grooming: with modified independence;standing Pt Will Perform Lower Body Dressing: with modified independence;sit to/from stand Pt Will Transfer to Toilet: with modified independence;ambulating Pt Will Perform Toileting - Clothing Manipulation and hygiene: with modified independence;sit to/from stand Additional ADL Goal #1: Pt will verbalize 3 fall prevention techniques to optimize independence and safety at dc.  Plan Discharge plan needs to be updated    Co-evaluation                 AM-PAC OT "6 Clicks" Daily Activity     Outcome Measure   Help from another person eating  meals?: A Little Help from another person taking care of personal grooming?: A Little Help from another person toileting, which includes using toliet, bedpan, or urinal?: A Little Help from another person bathing (including washing, rinsing, drying)?: A Little Help from another person to put on and taking off regular upper body clothing?: A Little Help from another person to put on and taking off regular lower body clothing?: A Little 6 Click Score: 18    End of Session Equipment Utilized During Treatment: Rolling walker (2 wheels)  OT Visit Diagnosis: Other abnormalities of gait and mobility (R26.89);Muscle weakness (generalized) (M62.81);History of falling (Z91.81);Other symptoms and signs involving cognitive function   Activity Tolerance Patient tolerated treatment well   Patient Left in chair;with call bell/phone within reach;with family/visitor present;with chair alarm set   Nurse Communication Other (comment) (BP readings, also entered into pt chart)        Time: 5427-0623 OT Time Calculation (min): 42 min  Charges: OT General Charges $OT Visit: 1 Visit OT Treatments $Self Care/Home Management : 38-52 mins  Tyrone Schimke, OT Acute Rehabilitation Services Office: [phone]   Hortencia Pilar 05/17/2021, 11:44 AM

## 2021-05-17 NOTE — Progress Notes (Signed)
STROKE TEAM PROGRESS NOTE   SUBJECTIVE (INTERVAL HISTORY) Her daughter is at the bedside.  Overall her condition is completely resolved. Pt sitting in chair, mildly sleepy, but denies HA, N/V. Moving all extremities. MRI with contrast showed pattern of PRES and 2 punctate infarcts at b/l frontoparietal areas, could be ischemic PRES or incidental infarcts. Will recommend 30 day cardiac monitoring.    OBJECTIVE Temp:  [97.7 F (36.5 C)-98.1 F (36.7 C)] 98.1 F (36.7 C) (12/27 0540) Pulse Rate:  [65-69] 65 (12/27 0540) Cardiac Rhythm: Normal sinus rhythm;Heart block (12/27 0743) Resp:  [16-18] 17 (12/27 0540) BP: (99-178)/(60-84) 99/60 (12/27 1114) SpO2:  [97 %-99 %] 97 % (12/27 0540)  No results for input(s): GLUCAP in the last 168 hours. Recent Labs  Lab 05/14/21 2045 05/16/21 0805 05/17/21 0500  NA 143 140 140  K 3.7 3.2* 4.0  CL 105 105 106  CO2 30 26 28   GLUCOSE 88 95 91  BUN 24* 29* 32*  CREATININE 0.57 0.70 0.67  CALCIUM 9.8 9.1 9.1   No results for input(s): AST, ALT, ALKPHOS, BILITOT, PROT, ALBUMIN in the last 168 hours. Recent Labs  Lab 05/14/21 2045 05/16/21 0805  WBC 4.5 5.6  NEUTROABS 3.2  --   HGB 14.3 13.9  HCT 44.7 42.8  MCV 91.8 91.3  PLT 157 141*   No results for input(s): CKTOTAL, CKMB, CKMBINDEX, TROPONINI in the last 168 hours. No results for input(s): LABPROT, INR in the last 72 hours. Recent Labs    05/14/21 2300  COLORURINE YELLOW  LABSPEC 1.013  PHURINE 7.0  GLUCOSEU NEGATIVE  HGBUR NEGATIVE  BILIRUBINUR NEGATIVE  KETONESUR NEGATIVE  PROTEINUR TRACE*  NITRITE NEGATIVE  LEUKOCYTESUR TRACE*       Component Value Date/Time   CHOL 189 05/16/2021 0805   TRIG 75 05/16/2021 0805   HDL 76 05/16/2021 0805   CHOLHDL 2.5 05/16/2021 0805   VLDL 15 05/16/2021 0805   LDLCALC 98 05/16/2021 0805   Lab Results  Component Value Date   HGBA1C 5.2 05/16/2021   No results found for: LABOPIA, COCAINSCRNUR, LABBENZ, AMPHETMU, THCU, LABBARB  No  results for input(s): ETH in the last 168 hours.  I have personally reviewed the radiological images below and agree with the radiology interpretations.  CT ANGIO HEAD NECK W WO CM  Result Date: 05/14/2021 CLINICAL DATA:  Thunderclap headache EXAM: CT ANGIOGRAPHY HEAD AND NECK TECHNIQUE: Multidetector CT imaging of the head and neck was performed using the standard protocol during bolus administration of intravenous contrast. Multiplanar CT image reconstructions and MIPs were obtained to evaluate the vascular anatomy. Carotid stenosis measurements (when applicable) are obtained utilizing NASCET criteria, using the distal internal carotid diameter as the denominator. CONTRAST:  163mL OMNIPAQUE IOHEXOL 350 MG/ML SOLN COMPARISON:  Head CT same day FINDINGS: CTA NECK FINDINGS SKELETON: There is no bony spinal canal stenosis. No lytic or blastic lesion. OTHER NECK: Normal pharynx, larynx and major salivary glands. No cervical lymphadenopathy. Unremarkable thyroid gland. UPPER CHEST: Biapical scarring. Mild tree-in-bud opacity at the posterior right upper lobe AORTIC ARCH: There is calcific atherosclerosis of the aortic arch. There is no aneurysm, dissection or hemodynamically significant stenosis of the visualized portion of the aorta. Normal 3 vessel aortic branching pattern. The visualized proximal subclavian arteries are widely patent. RIGHT CAROTID SYSTEM: No dissection, occlusion or aneurysm. Minimal atherosclerotic calcification at the carotid bifurcation without hemodynamically significant stenosis. LEFT CAROTID SYSTEM: No dissection, occlusion or aneurysm. Minimal atherosclerotic calcification at the carotid bifurcation without hemodynamically  significant stenosis. VERTEBRAL ARTERIES: Left dominant configuration. Both origins are clearly patent. There is no dissection, occlusion or flow-limiting stenosis to the skull base (V1-V3 segments). CTA HEAD FINDINGS POSTERIOR CIRCULATION: --Vertebral arteries:  Normal V4 segments. --Inferior cerebellar arteries: Normal. --Basilar artery: Normal. --Superior cerebellar arteries: Normal. --Posterior cerebral arteries (PCA): Normal. ANTERIOR CIRCULATION: --Intracranial internal carotid arteries: There is a 2 mm aneurysm projecting inferiorly and medially from the clinoid segment of the right internal carotid artery (series 7, image 110 and series 10, image 134). The internal carotid arteries at the skull base are otherwise unremarkable. --Anterior cerebral arteries (ACA): Normal. Both A1 segments are present. Patent anterior communicating artery (a-comm). --Middle cerebral arteries (MCA): Normal. VENOUS SINUSES: As permitted by contrast timing, patent. ANATOMIC VARIANTS: None Review of the MIP images confirms the above findings. IMPRESSION: 1. No emergent large vessel occlusion or high-grade stenosis of the intracranial arteries. 2. There is a 2 mm aneurysm projecting inferiorly and medially from the clinoid segment of the right internal carotid artery. 3. Mild tree-in-bud opacity at the posterior right upper lobe could indicate endobronchial infection. Aortic Atherosclerosis (ICD10-I70.0). Electronically Signed   By: Ulyses Jarred M.D.   On: 05/14/2021 22:10   CT Head Wo Contrast  Result Date: 05/14/2021 CLINICAL DATA:  Provided history: Headache, new or worsening. Additional history provided: Patient reports pain behind eyes, elevated blood pressure. EXAM: CT HEAD WITHOUT CONTRAST TECHNIQUE: Contiguous axial images were obtained from the base of the skull through the vertex without intravenous contrast. COMPARISON:  Structures 03/23/2021. FINDINGS: Brain: Mild generalized cerebral and cerebellar atrophy. At least moderate patchy and confluent hypoattenuation within the cerebral white matter, nonspecific but compatible with chronic small vessel ischemic disease. There is no acute intracranial hemorrhage. No demarcated cortical infarct. No extra-axial fluid collection. No  evidence of an intracranial mass. No midline shift. Vascular: No hyperdense vessel. Atherosclerotic calcifications. Skull: Normal. Negative for fracture or focal lesion. Sinuses/Orbits: Visualized orbits show no acute finding. No significant paranasal sinus disease at the imaged levels. Redemonstrated fairly symmetric discoid/polypoid soft tissue within the olfactory clefts of the nasal cavities. IMPRESSION: No evidence of acute intracranial abnormality. At least moderate chronic small vessel ischemic changes within the cerebral white matter. Mild generalized parenchymal atrophy. Fairly symmetric discoid/polypoid soft tissue within the olfactory clefts of the nasal cavities. These may reflect polyps or sinonasal respiratory epithelial adenomatoid hamartoma (REAH). Electronically Signed   By: Kellie Simmering D.O.   On: 05/14/2021 20:01   MR BRAIN WO CONTRAST  Result Date: 05/15/2021 CLINICAL DATA:  87 year old female with severe sudden headache. Nausea. Hypertensive at presentation (196/116). But afebrile (97 F). History of lymphoma. EXAM: MRI HEAD WITHOUT CONTRAST TECHNIQUE: Multiplanar, multiecho pulse sequences of the brain and surrounding structures were obtained without intravenous contrast. COMPARISON:  CTA head and neck and plain head CT yesterday. FINDINGS: Brain: Punctate focus of restricted diffusion in the right parietal cortex on series 2, image 32. However, much larger area of abnormal edema in the nearby postcentral gyrus (series 6, image 23 and series 250, image 34) and also throughout the left mesial temporal lobe (series 6, image 12) are facilitated on diffusion. Gyral T2 FLAIR hyperintensity and enlargement in those areas, affecting both gray and white matter. Decreased gyral T1 signal. No evidence of associated hemorrhage. No significant mass effect. No involvement in the left hemisphere. Elsewhere there is patchy and confluent T2 and FLAIR hyperintensity in the bilateral cerebral white matter  and deep gray matter nuclei compatible with chronic small vessel disease.  Similar patchy heterogeneity in the pons. Chronic microhemorrhage in the right deep cerebellar nuclei on series 7, image 26. No other chronic cerebral blood products. No cortical encephalomalacia identified. No midline shift, mass effect, ventriculomegaly, extra-axial collection or acute intracranial hemorrhage. Cervicomedullary junction and pituitary are within normal limits. Vascular: Major intracranial vascular flow voids are preserved. Skull and upper cervical spine: Negative for age visible cervical spine. Visualized bone marrow signal is within normal limits. Sinuses/Orbits: Postoperative changes to both globes, otherwise negative orbits. Minor paranasal sinus mucosal thickening. Other: Mastoids are clear. Grossly normal visible internal auditory structures. IMPRESSION: 1. Confluent gyral edema affecting both gray and white matter in the right mesial temporal lobe, and also the right parietal lobe postcentral gyrus. No associated hemorrhage or mass effect, and diffusion in those gyri is facilitated. Lack of fever and confusion argue against Herpes Encephalitis. And this may be an unusual unilateral presentation of posterior reversible encephalopathy syndrome (PRES) given the degree of hypertension. Other nonspecific Encephalitis is possible. The appearance is NOT typical of CNS lymphoma. 2. However, there is also evidence of a punctate cortical infarct in the right parietal lobe on series 2, image 32. No other acute ischemia identified. 3. No other acute intracranial abnormality. Moderate for age underlying chronic signal changes compatible with small vessel disease. Electronically Signed   By: Genevie Ann M.D.   On: 05/15/2021 06:58   MR BRAIN W WO CONTRAST  Result Date: 05/16/2021 CLINICAL DATA:  Transient ischemic attack. Stroke. Neurological deficit, acute, stroke suspected. Headache. EXAM: MRI HEAD WITHOUT AND WITH CONTRAST  TECHNIQUE: Multiplanar, multiecho pulse sequences of the brain and surrounding structures were obtained without and with intravenous contrast. CONTRAST:  55mL GADAVIST GADOBUTROL 1 MMOL/ML IV SOLN COMPARISON:  CT and MRI studies done over the last several days. CT as distant as 03/23/2021 FINDINGS: Brain: Diffusion imaging again demonstrates the punctate acute infarction affecting the cortical surface in the right parietal lobe. A second punctate focus of restricted diffusion affects the cortical surface of the left parietal lobe. These are consistent with micro embolic infarctions. The left-sided abnormality may have been present previously but was less well seen due to slice positioning. No other new infarction. There is redemonstration of increased T2 and FLAIR signal with mild brain swelling the mesial temporal lobe on the right and a right parietal cortex and underlying white matter, possibly the post central gyrus. I do think there are areas of more subtle abnormality affecting the cortex in the left hemisphere as well, raising likelihood of the possibility of posterior reversible encephalopathy. Again today, there is no abnormal contrast enhancement or vasogenic edema. Therefore, acute infectious cerebritis remains on likely. Posterior reversible encephalopathy is probably most likely. The differential diagnosis would include nonenhancing residua related to previous CNS lymphoma versus multifocal low-grade glioma. Postictal foci would be expected to have changed somewhat and possibly be associated with contrast enhancement. Elsewhere, chronic small-vessel ischemic changes of the pons, thalami, basal ganglia and hemispheric white matter appear the same. Vascular: Major vessels at the base of the brain show flow. Skull and upper cervical spine: Negative Sinuses/Orbits: Clear/normal Other: None IMPRESSION: Redemonstration of punctate focus of restricted diffusion in the right parietal lobe consistent with a micro  embolic infarction. There is a second similar focus in the left parietal lobe consistent with a micro embolic infarction. This was not as well seen on the previous study done yesterday but may have been present and less apparent due to slight difference in slice positioning. Background pattern  of chronic small vessel ischemic changes throughout the brain. Redemonstration of abnormal T2 and FLAIR signal without contrast enhancement affecting the mesial temporal lobe on the right and a right parietal gyrus. Mild swelling of the structures. I think there are some other areas of cortical involvement in both hemispheres which are better seen today as today's study suffers from less motion degradation. This raises the possibility of posterior reversible encephalopathy as the diagnosis. See above for full discussion. Acute infectious cerebritis seems unlikely. Electronically Signed   By: Nelson Chimes M.D.   On: 05/16/2021 12:37   ECHOCARDIOGRAM COMPLETE  Result Date: 05/16/2021    ECHOCARDIOGRAM REPORT   Patient Name:   MICKIE BADDERS Date of Exam: 05/16/2021 Medical Rec #:  [id number]        Height:       64.0 in Accession #:    [id number]       Weight:       100.0 lb Date of Birth:  [birth year]        BSA:          1.457 m Patient Age:    51 years         BP:           141/74 mmHg Patient Gender: F                HR:           65 bpm. Exam Location:  Inpatient Procedure: 2D Echo, Cardiac Doppler and Color Doppler Indications:    Stroke  History:        Patient has prior history of Echocardiogram examinations, most                 recent 04/07/2019. Risk Factors:Hypertension and Dyslipidemia.  Sonographer:    Merrie Roof RDCS Referring Phys: [place]  1. Left ventricular ejection fraction, by estimation, is 55 to 60%. The left ventricle has normal function. The left ventricle has no regional wall motion abnormalities. There is mild left ventricular hypertrophy. Left ventricular diastolic  parameters are indeterminate.  2. Right ventricular systolic function is normal. The right ventricular size is mildly enlarged. There is normal pulmonary artery systolic pressure.  3. Left atrial size was severely dilated.  4. Right atrial size was severely dilated.  5. The mitral valve is normal in structure. Trivial mitral valve regurgitation. No evidence of mitral stenosis.  6. Tricuspid valve regurgitation is mild to moderate.  7. The aortic valve is normal in structure. Aortic valve regurgitation is trivial. No aortic stenosis is present.  8. Aortic dilatation noted. There is borderline dilatation of the ascending aorta, measuring 39 mm.  9. The inferior vena cava is dilated in size with >50% respiratory variability, suggesting right atrial pressure of 8 mmHg. FINDINGS  Left Ventricle: Left ventricular ejection fraction, by estimation, is 55 to 60%. The left ventricle has normal function. The left ventricle has no regional wall motion abnormalities. The left ventricular internal cavity size was normal in size. There is  mild left ventricular hypertrophy. Left ventricular diastolic parameters are indeterminate. Right Ventricle: The right ventricular size is mildly enlarged. Right ventricular systolic function is normal. There is normal pulmonary artery systolic pressure. The tricuspid regurgitant velocity is 2.34 m/s, and with an assumed right atrial pressure of 8 mmHg, the estimated right ventricular systolic pressure is 31.5 mmHg. Left Atrium: Left atrial size was severely dilated. Right Atrium: Right atrial size was severely dilated. Pericardium: Trivial pericardial  effusion is present. Mitral Valve: The mitral valve is normal in structure. Trivial mitral valve regurgitation. No evidence of mitral valve stenosis. Tricuspid Valve: The tricuspid valve is normal in structure. Tricuspid valve regurgitation is mild to moderate. No evidence of tricuspid stenosis. Aortic Valve: The aortic valve is normal in  structure. Aortic valve regurgitation is trivial. No aortic stenosis is present. Aortic valve mean gradient measures 4.0 mmHg. Aortic valve peak gradient measures 6.8 mmHg. Aortic valve area, by VTI measures 1.76 cm. Pulmonic Valve: The pulmonic valve was normal in structure. Pulmonic valve regurgitation is not visualized. No evidence of pulmonic stenosis. Aorta: The aortic root is normal in size and structure and aortic dilatation noted. There is borderline dilatation of the ascending aorta, measuring 39 mm. Venous: The inferior vena cava is dilated in size with greater than 50% respiratory variability, suggesting right atrial pressure of 8 mmHg. IAS/Shunts: No atrial level shunt detected by color flow Doppler.  LEFT VENTRICLE PLAX 2D LVIDd:         3.50 cm LVIDs:         2.30 cm LV PW:         1.00 cm LV IVS:        1.20 cm LVOT diam:     1.80 cm LV SV:         49 LV SV Index:   34 LVOT Area:     2.54 cm  RIGHT VENTRICLE RV Basal diam:  4.30 cm RV Mid diam:    3.40 cm LEFT ATRIUM             Index        RIGHT ATRIUM           Index LA diam:        2.40 cm 1.65 cm/m   RA Area:     23.80 cm LA Vol (A2C):   62.5 ml 42.89 ml/m  RA Volume:   73.10 ml  50.17 ml/m LA Vol (A4C):   67.2 ml 46.12 ml/m LA Biplane Vol: 66.2 ml 45.43 ml/m  AORTIC VALVE AV Area (Vmax):    1.80 cm AV Area (Vmean):   1.82 cm AV Area (VTI):     1.76 cm AV Vmax:           130.00 cm/s AV Vmean:          87.300 cm/s AV VTI:            0.281 m AV Peak Grad:      6.8 mmHg AV Mean Grad:      4.0 mmHg LVOT Vmax:         92.20 cm/s LVOT Vmean:        62.400 cm/s LVOT VTI:          0.194 m LVOT/AV VTI ratio: 0.69  AORTA Ao Root diam: 3.60 cm Ao Asc diam:  3.90 cm TRICUSPID VALVE TR Peak grad:   21.9 mmHg TR Vmax:        234.00 cm/s  SHUNTS Systemic VTI:  0.19 m Systemic Diam: 1.80 cm Kirk Ruths MD Electronically signed by Kirk Ruths MD Signature Date/Time: 05/16/2021/2:53:21 PM    Final    CT VENOGRAM HEAD  Result Date:  05/15/2021 CLINICAL DATA:  87 year old female with severe sudden headache. Nausea. Hypertensive at presentation (196/116). But afebrile (97 F). Abnormal History of lymphoma.  Brain MRI without contrast this morning. EXAM: CT VENOGRAM HEAD TECHNIQUE: Venographic phase images of the brain were obtained following the  administration of intravenous contrast. Multiplanar reformats and maximum intensity projections were generated. CONTRAST:  60mL OMNIPAQUE IOHEXOL 350 MG/ML SOLN COMPARISON:  Brain MRI 0622 hours. CT head without contrast 05/14/2021. CTA head and neck 05/14/2021. FINDINGS: Venous sinuses: Fairly early venous contrast timing on the CTA yesterday. Good intravenous contrast opacification now. Superior sagittal sinus, torcula, straight sinus, vein of Galen and internal cerebral veins are enhancing and patent. Bilateral transverse and sigmoid sinuses are enhancing and patent. Bilateral IJ bulbs are patent. Cavernous sinus is enhancing and appears normal. The major cortical draining veins also appear to be enhancing and patent. Major intracranial arterial structures are enhancing and appear to be patent. Other findings: No enhancement of the abnormal mesial right temporal and right parietal gyri demonstrating CT hypodensity in keeping with the edema on MRI. No intracranial mass effect. No ventriculomegaly. Gray-white matter differentiation elsewhere appears stable since 05/14/2021. Review of the MIP images confirms the above findings IMPRESSION: 1. Normal intracranial CTV. No evidence of dural venous thrombosis. 2. No CT enhancement of the edematous mesial right temporal and right parietal gyri. 3. No new intracranial abnormality identified. Electronically Signed   By: Genevie Ann M.D.   On: 05/15/2021 08:39     PHYSICAL EXAM  Temp:  [97.7 F (36.5 C)-98.1 F (36.7 C)] 98.1 F (36.7 C) (12/27 0540) Pulse Rate:  [65-69] 65 (12/27 0540) Resp:  [16-18] 17 (12/27 0540) BP: (99-178)/(60-84) 99/60 (12/27  1114) SpO2:  [97 %-99 %] 97 % (12/27 0540)  General - Well nourished, well developed, in no apparent distress, mildly sleepy.  Ophthalmologic - fundi not visualized due to noncooperation.  Cardiovascular - Regular rhythm and rate.  Mental Status -  Level of arousal and orientation to month, place, and person were intact. However, not orientated to year or age but able to tell me her daughters name Language including expression, naming, repetition, comprehension was assessed and found intact.  Cranial Nerves II - XII - II - Visual field intact OU. III, IV, VI - Extraocular movements intact. V - Facial sensation intact bilaterally. VII - Facial movement intact bilaterally. VIII - Hearing & vestibular intact bilaterally. X - Palate elevates symmetrically. XI - Chin turning & shoulder shrug intact bilaterally. XII - Tongue protrusion intact.  Motor Strength - The patients strength was symmetrical in all extremities and pronator drift was absent.  Bulk was normal and fasciculations were absent.   Motor Tone - Muscle tone was assessed at the neck and appendages and was normal.  Reflexes - The patients reflexes were symmetrical in all extremities and she had no pathological reflexes.  Sensory - Light touch, temperature/pinprick were assessed and were symmetrical.    Coordination - The patient had normal movements in the hands with no ataxia or dysmetria.  Tremor was absent.  Gait and Station - deferred.   ASSESSMENT/PLAN Ms. KENT BRAUNSCHWEIG is a 87 y.o. female with history of HTN, lymphoma and breast cancer both in remission, DVT on eliquis admitted for HA and N/V. No tPA given due to outside window.    Possible atypical PRES MRI without 12/25 - Confluent gyral edema affecting both gray and white matter in the right mesial temporal lobe, and also the right parietal lobe postcentral gyrus. May be an unusual unilateral presentation of PRES given the degree of hypertension.  MRI with  12/26 - abnormal T2 and FLAIR signal without contrast enhancement affecting the mesial temporal lobe on the right and a right parietal gyrus. This raises the possibility of  PRES as the diagnosis Symptoms resolved after BP management No evidence of infection, no fever or leukocytosis Do not feel LP needed given symptoms resolved and no sign of infection Need to follow up with neurology to repeat MRI in 1-2 months. Strict BP control at home  Stroke, incidental finding - punctate infarcts at b/l parietal lobes - could be ischemic PRES or microembolic infarcts MRI  48/88 - a punctate cortical infarct in the right parietal lobe  MRI 12/26 - punctate focus of restricted diffusion in the right parietal lobe and left parietal lobe CTA head and neck - unremarkable, 36mm right ICA siphon aneurysm 2D Echo  EF 55-60% LDL 98 HgbA1c 5.2 Eliquis for VTE prophylaxis Eliquis (apixaban) 2.5mg  bid prior to admission, now on Eliquis (apixaban) 2.5mg  bid. Continue on discharge. Patient counseled to be compliant with her antithrombotic medications Ongoing aggressive stroke risk factor management Therapy recommendations:  HH PT Disposition:  home today  Hypertension Stable now Strict BP control at home Long term BP goal normotensive  Hyperlipidemia Home meds:  none  LDL 98, goal < 70 Now on lipitor 20, no need high intensity given advanced age and LDL not far from goal Continue statin at discharge  Other Stroke Risk Factors Advanced age DVT on eliquis  Other Active Problems Lymphoma on remission Breast cancer on remission  Hospital day # 2  Neurology will sign off. Please call with questions. Pt will follow up with stroke clinic NP at [facility] in about 4 weeks. Thanks for the consult.   Rosalin Hawking, MD PhD Stroke Neurology 05/17/2021 12:29 PM    To contact Stroke Continuity provider, please refer to [email or web address] After hours, contact General Neurology

## 2021-05-17 NOTE — TOC Transition Note (Signed)
Transition of Care [facility]) - CM/SW Discharge Note   Patient Details  Name: Sarah [name] MRN: [id number] Date of Birth: 14-Sep-1932  Transition of Care [facility]) CM/SW Contact:  Sarah Collet, RN Phone Number: 05/17/2021, 1:06 PM   Clinical Narrative:    Spoke to patient's daughter Sarah [name] over the phone. She is agreeable to return to ILF with [facility] services continuing through [place]. Current for PT SLP, added to order to include PT OT SLP.  Sarah [name] verifies that no new additional DME is needed. Sarah [name] at bedside. No other TOC needs identified.     Final next level of care: [place] Barriers to Discharge: No Barriers Identified   Patient Goals and CMS Choice Patient states their goals for this hospitalization and ongoing recovery are:: to go home CMS Medicare.gov Compare Post Acute Care list provided to:: Other (Comment Required) Choice offered to / list presented to : Adult Children  Discharge Placement                       Discharge Plan and Services                DME Arranged: N/A         HH Arranged: PT, OT, Speech Therapy HH Agency: [facility] Date [place]: 05/17/21 Time [place]: 2025 Representative spoke with at [place]: Sarah [name] (Reserve) Interventions     Readmission Risk Interventions No flowsheet data found.

## 2021-05-17 NOTE — Plan of Care (Signed)
°  Problem: Education: Goal: Knowledge of disease or condition will improve Outcome: Progressing Goal: Knowledge of patient specific risk factors will improve (INDIVIDUALIZE FOR PATIENT) Outcome: Progressing   Problem: Ischemic Stroke/TIA Tissue Perfusion: Goal: Complications of ischemic stroke/TIA will be minimized Outcome: Progressing   Problem: Education: Goal: Knowledge of General Education information will improve Description: Including pain rating scale, medication(s)/side effects and non-pharmacologic comfort measures Outcome: Progressing   Problem: Health Behavior/Discharge Planning: Goal: Ability to manage health-related needs will improve Outcome: Progressing   Problem: Clinical Measurements: Goal: Ability to maintain clinical measurements within normal limits will improve Outcome: Progressing Goal: Will remain free from infection Outcome: Progressing Goal: Diagnostic test results will improve Outcome: Progressing Goal: Respiratory complications will improve Outcome: Progressing Goal: Cardiovascular complication will be avoided Outcome: Progressing   Problem: Activity: Goal: Risk for activity intolerance will decrease Outcome: Progressing   Problem: Nutrition: Goal: Adequate nutrition will be maintained Outcome: Progressing   Problem: Coping: Goal: Level of anxiety will decrease Outcome: Progressing   Problem: Elimination: Goal: Will not experience complications related to bowel motility Outcome: Progressing Goal: Will not experience complications related to urinary retention Outcome: Progressing   Problem: Pain Managment: Goal: General experience of comfort will improve Outcome: Progressing   Problem: Safety: Goal: Ability to remain free from injury will improve Outcome: Progressing   Problem: Skin Integrity: Goal: Risk for impaired skin integrity will decrease Outcome: Progressing

## 2021-05-18 IMAGING — DX DG CHEST 2V
2 series · 2 of 2 positions shown · non-contrast
Comparison: Chest x-ray dated December 30, 2019.

CLINICAL DATA: Cough.  History of lymphoma.

EXAM:
CHEST - 2 VIEW

[chest pa]
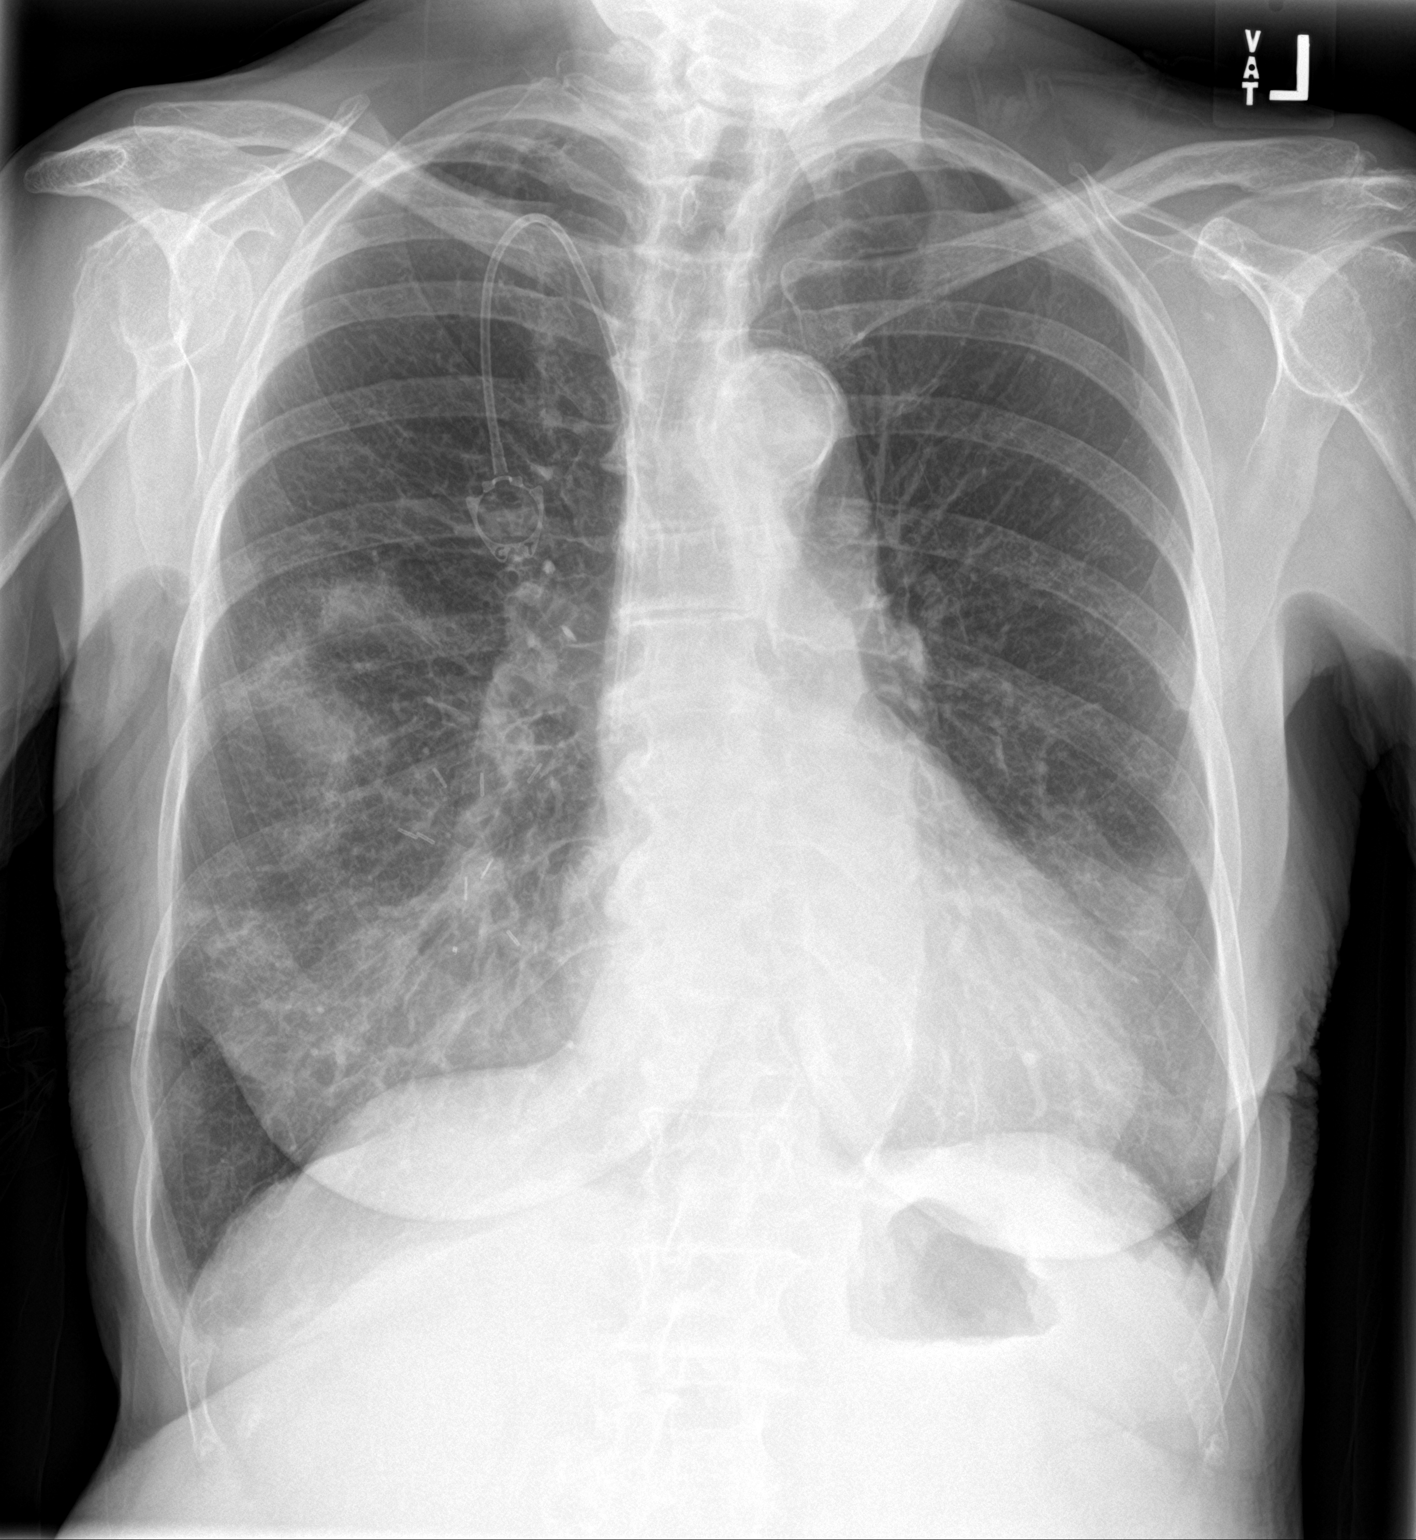

[chest lat]
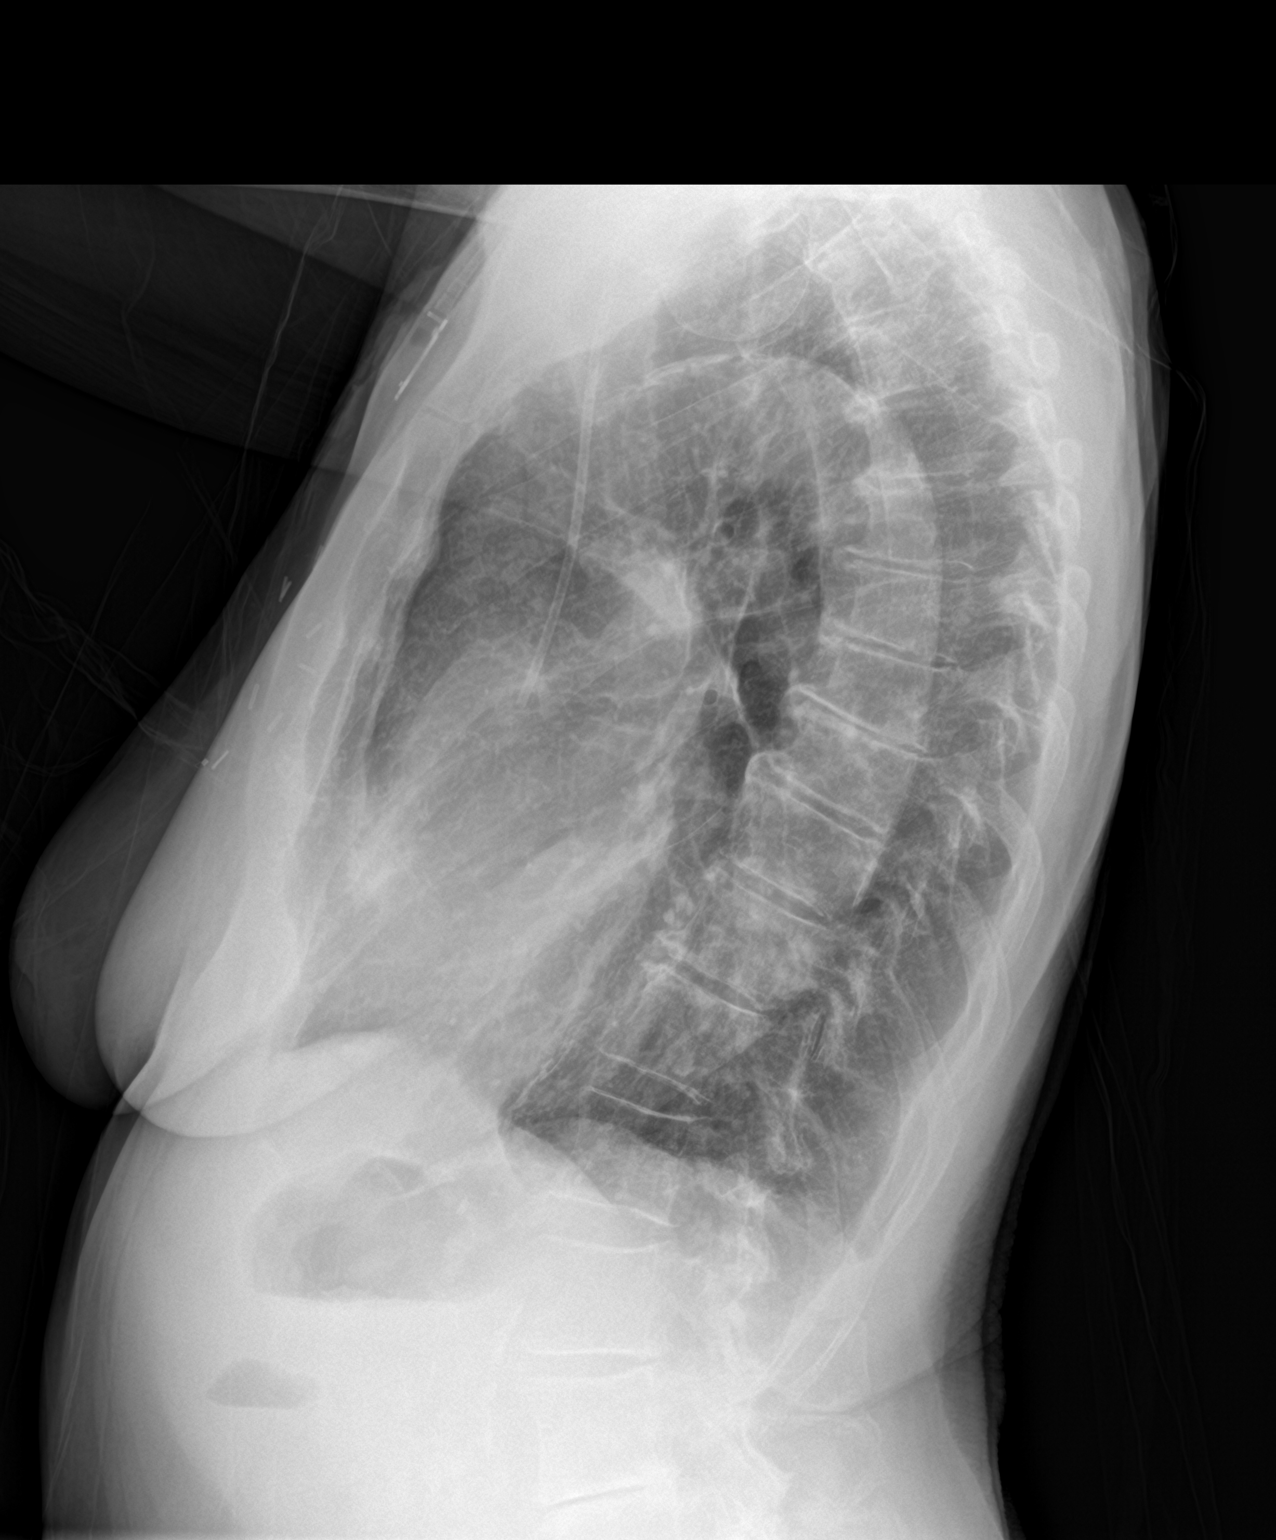

[2 of 2 positions shown; findings below may reference images not displayed]

FINDINGS: Unchanged right chest wall port catheter. Unchanged borderline
cardiomegaly. Increasing patchy opacities in the right lower lobe.
No pleural effusion or pneumothorax. No acute osseous abnormality.
IMPRESSION: 1. Worsening right lower lobe pneumonia.

## 2021-05-20 ENCOUNTER — Other Ambulatory Visit: Payer: Self-pay | Admitting: *Deleted

## 2021-05-20 DIAGNOSIS — I10 Essential (primary) hypertension: Secondary | ICD-10-CM | POA: Diagnosis not present

## 2021-05-20 NOTE — Patient Outreach (Signed)
[place] [facility]) Care Management  05/20/2021  Sarah [name] 03/23/1933 [id number]  COVERING [address], RN  EMMI-STROKE- Unsuccessful RED ON EMMI ALERT Day #1 Date: 05/20/2021 Red Alert Reason:NO-been able to take every dose of medication.   Outreach attempt # 1 to patient. No answer at present. RNCM left HIPAA compliant voicemail along with contact info.    Will report to [facility] for ongoing follow up calls.  Raina Mina, RN Care Management Coordinator [place] Office [phone]

## 2021-05-20 NOTE — Patient Outreach (Signed)
Received a Red Flag [place] Stroke notification for Sarah [name].  I have assigned Valente David, RN to outreach

## 2021-05-26 ENCOUNTER — Other Ambulatory Visit: Payer: Self-pay | Admitting: *Deleted

## 2021-05-26 DIAGNOSIS — Z96651 Presence of right artificial knee joint: Secondary | ICD-10-CM | POA: Diagnosis not present

## 2021-05-26 DIAGNOSIS — H8110 Benign paroxysmal vertigo, unspecified ear: Secondary | ICD-10-CM | POA: Diagnosis not present

## 2021-05-26 DIAGNOSIS — E7800 Pure hypercholesterolemia, unspecified: Secondary | ICD-10-CM | POA: Diagnosis not present

## 2021-05-26 DIAGNOSIS — Z9011 Acquired absence of right breast and nipple: Secondary | ICD-10-CM | POA: Diagnosis not present

## 2021-05-26 DIAGNOSIS — G4700 Insomnia, unspecified: Secondary | ICD-10-CM | POA: Diagnosis not present

## 2021-05-26 DIAGNOSIS — K5790 Diverticulosis of intestine, part unspecified, without perforation or abscess without bleeding: Secondary | ICD-10-CM | POA: Diagnosis not present

## 2021-05-26 DIAGNOSIS — Z9181 History of falling: Secondary | ICD-10-CM | POA: Diagnosis not present

## 2021-05-26 DIAGNOSIS — G43909 Migraine, unspecified, not intractable, without status migrainosus: Secondary | ICD-10-CM | POA: Diagnosis not present

## 2021-05-26 DIAGNOSIS — I872 Venous insufficiency (chronic) (peripheral): Secondary | ICD-10-CM | POA: Diagnosis not present

## 2021-05-26 DIAGNOSIS — G629 Polyneuropathy, unspecified: Secondary | ICD-10-CM | POA: Diagnosis not present

## 2021-05-26 DIAGNOSIS — I10 Essential (primary) hypertension: Secondary | ICD-10-CM | POA: Diagnosis not present

## 2021-05-26 DIAGNOSIS — E039 Hypothyroidism, unspecified: Secondary | ICD-10-CM | POA: Diagnosis not present

## 2021-05-26 DIAGNOSIS — I716 Thoracoabdominal aortic aneurysm, without rupture, unspecified: Secondary | ICD-10-CM | POA: Diagnosis not present

## 2021-05-26 DIAGNOSIS — Z7901 Long term (current) use of anticoagulants: Secondary | ICD-10-CM | POA: Diagnosis not present

## 2021-05-26 DIAGNOSIS — H3530 Unspecified macular degeneration: Secondary | ICD-10-CM | POA: Diagnosis not present

## 2021-05-26 DIAGNOSIS — C865 Angioimmunoblastic T-cell lymphoma: Secondary | ICD-10-CM | POA: Diagnosis not present

## 2021-05-26 DIAGNOSIS — M810 Age-related osteoporosis without current pathological fracture: Secondary | ICD-10-CM | POA: Diagnosis not present

## 2021-05-26 DIAGNOSIS — Z86718 Personal history of other venous thrombosis and embolism: Secondary | ICD-10-CM | POA: Diagnosis not present

## 2021-05-26 DIAGNOSIS — I671 Cerebral aneurysm, nonruptured: Secondary | ICD-10-CM | POA: Diagnosis not present

## 2021-05-26 NOTE — Patient Outreach (Signed)
[place] [facility]) Care Management  05/26/2021  Sarah [name] 1932/11/01 [id number]   RED ON EMMI ALERT - Stroke Day # 1 Date: 12/29 Red Alert Reason: Able to take every dose of medication?  NO   Outreach attempt #2, successful to daughter.  Identity verified.  This care manager introduced self and stated purpose of call.  [facility] care management services explained.    Per daughter, member lives alone at [facility] for independent living facility.  State member is independent in all ADLs, has emergency alert system, and is able to prepare a light breakfast and lunch, dinner is provided daily at facility.  Daughter is very active in member's care.  State there were questions regarding medications initially because they were having a hard time keeping blood pressure under control.  Has been seen by PCP since discharge, Carvedilol increased to 25 mg twice a day, also has Hydralazine PRN for SBP greater than 160.  Member is active with home health for nursing and PT.  Discussed other follow up appointments with daughter, noted office visit with neurology on 3/7 and recommendations at discharge for follow up with neurosurgeon.  Daughter is reluctant to attend either of these appointments as she state member has brain aneurysm and there is not likelihood of intervention due to member's age and health conditions.  Encouraged to consider as they will be able to manage member's symptoms, state she will think about it of the next couple weeks.  Denies any urgent concerns, encouraged to contact this care manager with questions.    Plan: RN CM will follow up within the next 2 weeks.  Will send information regarding HTN management and stroke prevention.    Valente David, RN, MSN, [place] Manager [phone]

## 2021-05-31 ENCOUNTER — Ambulatory Visit (INDEPENDENT_AMBULATORY_CARE_PROVIDER_SITE_OTHER): Payer: Medicare Other | Admitting: Podiatry

## 2021-05-31 ENCOUNTER — Encounter: Payer: Self-pay | Admitting: Podiatry

## 2021-05-31 ENCOUNTER — Other Ambulatory Visit: Payer: Self-pay

## 2021-05-31 DIAGNOSIS — I671 Cerebral aneurysm, nonruptured: Secondary | ICD-10-CM | POA: Diagnosis not present

## 2021-05-31 DIAGNOSIS — G629 Polyneuropathy, unspecified: Secondary | ICD-10-CM | POA: Diagnosis not present

## 2021-05-31 DIAGNOSIS — M810 Age-related osteoporosis without current pathological fracture: Secondary | ICD-10-CM | POA: Diagnosis not present

## 2021-05-31 DIAGNOSIS — M79674 Pain in right toe(s): Secondary | ICD-10-CM | POA: Diagnosis not present

## 2021-05-31 DIAGNOSIS — I872 Venous insufficiency (chronic) (peripheral): Secondary | ICD-10-CM | POA: Diagnosis not present

## 2021-05-31 DIAGNOSIS — M79675 Pain in left toe(s): Secondary | ICD-10-CM | POA: Diagnosis not present

## 2021-05-31 DIAGNOSIS — G43909 Migraine, unspecified, not intractable, without status migrainosus: Secondary | ICD-10-CM | POA: Diagnosis not present

## 2021-05-31 DIAGNOSIS — B351 Tinea unguium: Secondary | ICD-10-CM

## 2021-05-31 DIAGNOSIS — L84 Corns and callosities: Secondary | ICD-10-CM

## 2021-05-31 DIAGNOSIS — M205X2 Other deformities of toe(s) (acquired), left foot: Secondary | ICD-10-CM

## 2021-05-31 DIAGNOSIS — I10 Essential (primary) hypertension: Secondary | ICD-10-CM | POA: Diagnosis not present

## 2021-05-31 NOTE — Progress Notes (Signed)
This patient returns to my office for at risk foot care.  This patient requires this care by a professional since this patient will be at risk due to having neuropathy  and coagulation defect.  Patient is taking eliquis.This patient is unable to cut nails herself since the patient cannot reach her nails.These nails are painful walking and wearing shoes.  She has painful corn on the tip of her third toe left foot. She presents to the office with her daughter.This patient presents for at risk foot care today.  General Appearance  Alert, conversant and in no acute stress.  Vascular  Dorsalis pedis and posterior tibial  pulses are  weakly palpable  bilaterally.  Capillary return is within normal limits  bilaterally. Temperature is within normal limits  bilaterally.Digital hair  noted.    Neurologic  Senn-Weinstein monofilament wire test within normal limits  bilaterally. Muscle power within normal limits bilaterally.  Nails Thick disfigured discolored nails with subungual debris  from hallux to fifth toes bilaterally. No evidence of bacterial infection or drainage bilaterally.  Orthopedic  No limitations of motion  feet .  No crepitus or effusions noted.  Hammer toes  B/L.  Skin  normotropic skin with no porokeratosis noted bilaterally.  No signs of infections or ulcers noted.   Distal clavi 3rd toe left foot.     Onychomycosis  Pain in right toes  Pain in left toes  Consent was obtained for treatment procedures.   Mechanical debridement of nails 1-5  bilaterally performed with a nail nipper.  Filed with dremel without incident. Debridement of clavi with # 15 blade.  Padding dispensed.   Return office visit   9 weeks                  Told patient to return for periodic foot care and evaluation due to potential at risk complications.   Gardiner Barefoot DPM

## 2021-06-02 DIAGNOSIS — M810 Age-related osteoporosis without current pathological fracture: Secondary | ICD-10-CM | POA: Diagnosis not present

## 2021-06-02 DIAGNOSIS — I872 Venous insufficiency (chronic) (peripheral): Secondary | ICD-10-CM | POA: Diagnosis not present

## 2021-06-02 DIAGNOSIS — G629 Polyneuropathy, unspecified: Secondary | ICD-10-CM | POA: Diagnosis not present

## 2021-06-02 DIAGNOSIS — I671 Cerebral aneurysm, nonruptured: Secondary | ICD-10-CM | POA: Diagnosis not present

## 2021-06-02 DIAGNOSIS — G43909 Migraine, unspecified, not intractable, without status migrainosus: Secondary | ICD-10-CM | POA: Diagnosis not present

## 2021-06-02 DIAGNOSIS — I10 Essential (primary) hypertension: Secondary | ICD-10-CM | POA: Diagnosis not present

## 2021-06-03 DIAGNOSIS — I671 Cerebral aneurysm, nonruptured: Secondary | ICD-10-CM | POA: Diagnosis not present

## 2021-06-03 DIAGNOSIS — I10 Essential (primary) hypertension: Secondary | ICD-10-CM | POA: Diagnosis not present

## 2021-06-03 DIAGNOSIS — I872 Venous insufficiency (chronic) (peripheral): Secondary | ICD-10-CM | POA: Diagnosis not present

## 2021-06-03 DIAGNOSIS — G43909 Migraine, unspecified, not intractable, without status migrainosus: Secondary | ICD-10-CM | POA: Diagnosis not present

## 2021-06-03 DIAGNOSIS — G629 Polyneuropathy, unspecified: Secondary | ICD-10-CM | POA: Diagnosis not present

## 2021-06-03 DIAGNOSIS — M810 Age-related osteoporosis without current pathological fracture: Secondary | ICD-10-CM | POA: Diagnosis not present

## 2021-06-07 ENCOUNTER — Other Ambulatory Visit: Payer: Self-pay | Admitting: *Deleted

## 2021-06-07 DIAGNOSIS — G43909 Migraine, unspecified, not intractable, without status migrainosus: Secondary | ICD-10-CM | POA: Diagnosis not present

## 2021-06-07 DIAGNOSIS — I10 Essential (primary) hypertension: Secondary | ICD-10-CM | POA: Diagnosis not present

## 2021-06-07 DIAGNOSIS — I671 Cerebral aneurysm, nonruptured: Secondary | ICD-10-CM | POA: Diagnosis not present

## 2021-06-07 DIAGNOSIS — I872 Venous insufficiency (chronic) (peripheral): Secondary | ICD-10-CM | POA: Diagnosis not present

## 2021-06-07 DIAGNOSIS — M810 Age-related osteoporosis without current pathological fracture: Secondary | ICD-10-CM | POA: Diagnosis not present

## 2021-06-07 DIAGNOSIS — G629 Polyneuropathy, unspecified: Secondary | ICD-10-CM | POA: Diagnosis not present

## 2021-06-07 NOTE — Patient Outreach (Signed)
[place] [facility]) Care Management  06/07/2021  SHERIA ROSELLO [birth year] [id number]   Outgoing call placed to member's daughter to follow up on decision for [facility] involvement, no answer.  HIPAA compliant voice message left, will follow up within the next 3-4 business days.  Valente David, RN, MSN, [place] Manager [phone]

## 2021-06-08 DIAGNOSIS — I671 Cerebral aneurysm, nonruptured: Secondary | ICD-10-CM | POA: Diagnosis not present

## 2021-06-08 DIAGNOSIS — I10 Essential (primary) hypertension: Secondary | ICD-10-CM | POA: Diagnosis not present

## 2021-06-08 DIAGNOSIS — M810 Age-related osteoporosis without current pathological fracture: Secondary | ICD-10-CM | POA: Diagnosis not present

## 2021-06-08 DIAGNOSIS — G629 Polyneuropathy, unspecified: Secondary | ICD-10-CM | POA: Diagnosis not present

## 2021-06-08 DIAGNOSIS — G43909 Migraine, unspecified, not intractable, without status migrainosus: Secondary | ICD-10-CM | POA: Diagnosis not present

## 2021-06-08 DIAGNOSIS — I872 Venous insufficiency (chronic) (peripheral): Secondary | ICD-10-CM | POA: Diagnosis not present

## 2021-06-10 ENCOUNTER — Other Ambulatory Visit: Payer: Self-pay | Admitting: *Deleted

## 2021-06-10 DIAGNOSIS — I671 Cerebral aneurysm, nonruptured: Secondary | ICD-10-CM | POA: Diagnosis not present

## 2021-06-10 DIAGNOSIS — I10 Essential (primary) hypertension: Secondary | ICD-10-CM | POA: Diagnosis not present

## 2021-06-10 DIAGNOSIS — M810 Age-related osteoporosis without current pathological fracture: Secondary | ICD-10-CM | POA: Diagnosis not present

## 2021-06-10 DIAGNOSIS — G629 Polyneuropathy, unspecified: Secondary | ICD-10-CM | POA: Diagnosis not present

## 2021-06-10 DIAGNOSIS — G43909 Migraine, unspecified, not intractable, without status migrainosus: Secondary | ICD-10-CM | POA: Diagnosis not present

## 2021-06-10 DIAGNOSIS — I872 Venous insufficiency (chronic) (peripheral): Secondary | ICD-10-CM | POA: Diagnosis not present

## 2021-06-10 NOTE — Patient Outreach (Signed)
[place] [facility]) Care Management  06/10/2021  ANTIONETTA ATOR [birth year] [id number]   Outreach attempt #2 to member's daughter to follow up on decision for [facility] services.  She agrees member would benefit however she does not have time to complete assessment today.  Will follow up within the next week for full initial telephonic assessment.  Denies any urgent concerns, encouraged to contact this care manager with questions.  Agrees to follow up within the next week.  Valente David, RN, MSN, [place] Manager [phone]

## 2021-06-14 DIAGNOSIS — G629 Polyneuropathy, unspecified: Secondary | ICD-10-CM | POA: Diagnosis not present

## 2021-06-14 DIAGNOSIS — I10 Essential (primary) hypertension: Secondary | ICD-10-CM | POA: Diagnosis not present

## 2021-06-14 DIAGNOSIS — I671 Cerebral aneurysm, nonruptured: Secondary | ICD-10-CM | POA: Diagnosis not present

## 2021-06-14 DIAGNOSIS — I872 Venous insufficiency (chronic) (peripheral): Secondary | ICD-10-CM | POA: Diagnosis not present

## 2021-06-14 DIAGNOSIS — G43909 Migraine, unspecified, not intractable, without status migrainosus: Secondary | ICD-10-CM | POA: Diagnosis not present

## 2021-06-14 DIAGNOSIS — M810 Age-related osteoporosis without current pathological fracture: Secondary | ICD-10-CM | POA: Diagnosis not present

## 2021-06-15 ENCOUNTER — Other Ambulatory Visit: Payer: Self-pay | Admitting: *Deleted

## 2021-06-15 DIAGNOSIS — I671 Cerebral aneurysm, nonruptured: Secondary | ICD-10-CM | POA: Diagnosis not present

## 2021-06-15 DIAGNOSIS — G629 Polyneuropathy, unspecified: Secondary | ICD-10-CM | POA: Diagnosis not present

## 2021-06-15 DIAGNOSIS — M810 Age-related osteoporosis without current pathological fracture: Secondary | ICD-10-CM | POA: Diagnosis not present

## 2021-06-15 DIAGNOSIS — G43909 Migraine, unspecified, not intractable, without status migrainosus: Secondary | ICD-10-CM | POA: Diagnosis not present

## 2021-06-15 DIAGNOSIS — I10 Essential (primary) hypertension: Secondary | ICD-10-CM | POA: Diagnosis not present

## 2021-06-15 DIAGNOSIS — I872 Venous insufficiency (chronic) (peripheral): Secondary | ICD-10-CM | POA: Diagnosis not present

## 2021-06-15 NOTE — Patient Outreach (Signed)
[place] [facility]) Care Management  06/15/2021  MANAMI TUTOR November 10, 1932 [id number]   Outgoing call placed to member's daughter, no answer.  HIPAA compliant voice message left.  Will follow up within the next 3-4 business days.  Valente David, RN, MSN, [place] Manager [phone]

## 2021-06-16 ENCOUNTER — Ambulatory Visit: Payer: Self-pay | Admitting: *Deleted

## 2021-06-16 DIAGNOSIS — U071 COVID-19: Secondary | ICD-10-CM | POA: Diagnosis not present

## 2021-06-20 ENCOUNTER — Other Ambulatory Visit: Payer: Self-pay | Admitting: *Deleted

## 2021-06-20 NOTE — Patient Outreach (Signed)
[place] [facility]) Care Management  06/20/2021  Sarah [name] 1933/03/16 [id number]   Outgoing call placed to member's daughter.  Discussed ongoing care management with PCP office, she agrees stating they have appointment at office tomorrow.  Will close case at this time as member will be active with external care management program.  Sarah David, RN, MSN, [place] Manager [phone]

## 2021-06-21 DIAGNOSIS — R609 Edema, unspecified: Secondary | ICD-10-CM | POA: Diagnosis not present

## 2021-06-21 DIAGNOSIS — I10 Essential (primary) hypertension: Secondary | ICD-10-CM | POA: Diagnosis not present

## 2021-06-22 DIAGNOSIS — R609 Edema, unspecified: Secondary | ICD-10-CM | POA: Diagnosis not present

## 2021-06-23 DIAGNOSIS — G43909 Migraine, unspecified, not intractable, without status migrainosus: Secondary | ICD-10-CM | POA: Diagnosis not present

## 2021-06-23 DIAGNOSIS — I872 Venous insufficiency (chronic) (peripheral): Secondary | ICD-10-CM | POA: Diagnosis not present

## 2021-06-23 DIAGNOSIS — I671 Cerebral aneurysm, nonruptured: Secondary | ICD-10-CM | POA: Diagnosis not present

## 2021-06-23 DIAGNOSIS — I10 Essential (primary) hypertension: Secondary | ICD-10-CM | POA: Diagnosis not present

## 2021-06-23 DIAGNOSIS — G629 Polyneuropathy, unspecified: Secondary | ICD-10-CM | POA: Diagnosis not present

## 2021-06-23 DIAGNOSIS — M810 Age-related osteoporosis without current pathological fracture: Secondary | ICD-10-CM | POA: Diagnosis not present

## 2021-06-25 DIAGNOSIS — E039 Hypothyroidism, unspecified: Secondary | ICD-10-CM | POA: Diagnosis not present

## 2021-06-25 DIAGNOSIS — Z9011 Acquired absence of right breast and nipple: Secondary | ICD-10-CM | POA: Diagnosis not present

## 2021-06-25 DIAGNOSIS — I10 Essential (primary) hypertension: Secondary | ICD-10-CM | POA: Diagnosis not present

## 2021-06-25 DIAGNOSIS — C865 Angioimmunoblastic T-cell lymphoma: Secondary | ICD-10-CM | POA: Diagnosis not present

## 2021-06-25 DIAGNOSIS — I671 Cerebral aneurysm, nonruptured: Secondary | ICD-10-CM | POA: Diagnosis not present

## 2021-06-25 DIAGNOSIS — Z86718 Personal history of other venous thrombosis and embolism: Secondary | ICD-10-CM | POA: Diagnosis not present

## 2021-06-25 DIAGNOSIS — H8110 Benign paroxysmal vertigo, unspecified ear: Secondary | ICD-10-CM | POA: Diagnosis not present

## 2021-06-25 DIAGNOSIS — K862 Cyst of pancreas: Secondary | ICD-10-CM | POA: Diagnosis not present

## 2021-06-25 DIAGNOSIS — M1990 Unspecified osteoarthritis, unspecified site: Secondary | ICD-10-CM | POA: Diagnosis not present

## 2021-06-25 DIAGNOSIS — I716 Thoracoabdominal aortic aneurysm, without rupture, unspecified: Secondary | ICD-10-CM | POA: Diagnosis not present

## 2021-06-25 DIAGNOSIS — G629 Polyneuropathy, unspecified: Secondary | ICD-10-CM | POA: Diagnosis not present

## 2021-06-25 DIAGNOSIS — Z853 Personal history of malignant neoplasm of breast: Secondary | ICD-10-CM | POA: Diagnosis not present

## 2021-06-25 DIAGNOSIS — J984 Other disorders of lung: Secondary | ICD-10-CM | POA: Diagnosis not present

## 2021-06-25 DIAGNOSIS — K5730 Diverticulosis of large intestine without perforation or abscess without bleeding: Secondary | ICD-10-CM | POA: Diagnosis not present

## 2021-06-25 DIAGNOSIS — E7800 Pure hypercholesterolemia, unspecified: Secondary | ICD-10-CM | POA: Diagnosis not present

## 2021-06-25 DIAGNOSIS — G4700 Insomnia, unspecified: Secondary | ICD-10-CM | POA: Diagnosis not present

## 2021-06-25 DIAGNOSIS — Z9181 History of falling: Secondary | ICD-10-CM | POA: Diagnosis not present

## 2021-06-25 DIAGNOSIS — Z7901 Long term (current) use of anticoagulants: Secondary | ICD-10-CM | POA: Diagnosis not present

## 2021-06-25 DIAGNOSIS — M810 Age-related osteoporosis without current pathological fracture: Secondary | ICD-10-CM | POA: Diagnosis not present

## 2021-06-25 DIAGNOSIS — I8390 Asymptomatic varicose veins of unspecified lower extremity: Secondary | ICD-10-CM | POA: Diagnosis not present

## 2021-06-25 DIAGNOSIS — Z8616 Personal history of COVID-19: Secondary | ICD-10-CM | POA: Diagnosis not present

## 2021-06-25 DIAGNOSIS — H3530 Unspecified macular degeneration: Secondary | ICD-10-CM | POA: Diagnosis not present

## 2021-06-25 DIAGNOSIS — I872 Venous insufficiency (chronic) (peripheral): Secondary | ICD-10-CM | POA: Diagnosis not present

## 2021-06-25 DIAGNOSIS — G43909 Migraine, unspecified, not intractable, without status migrainosus: Secondary | ICD-10-CM | POA: Diagnosis not present

## 2021-06-25 DIAGNOSIS — Z96651 Presence of right artificial knee joint: Secondary | ICD-10-CM | POA: Diagnosis not present

## 2021-06-28 DIAGNOSIS — G43909 Migraine, unspecified, not intractable, without status migrainosus: Secondary | ICD-10-CM | POA: Diagnosis not present

## 2021-06-28 DIAGNOSIS — G629 Polyneuropathy, unspecified: Secondary | ICD-10-CM | POA: Diagnosis not present

## 2021-06-28 DIAGNOSIS — C865 Angioimmunoblastic T-cell lymphoma: Secondary | ICD-10-CM | POA: Diagnosis not present

## 2021-06-28 DIAGNOSIS — I10 Essential (primary) hypertension: Secondary | ICD-10-CM | POA: Diagnosis not present

## 2021-06-28 DIAGNOSIS — I872 Venous insufficiency (chronic) (peripheral): Secondary | ICD-10-CM | POA: Diagnosis not present

## 2021-06-28 DIAGNOSIS — I671 Cerebral aneurysm, nonruptured: Secondary | ICD-10-CM | POA: Diagnosis not present

## 2021-06-29 DIAGNOSIS — R600 Localized edema: Secondary | ICD-10-CM | POA: Diagnosis not present

## 2021-06-29 DIAGNOSIS — I10 Essential (primary) hypertension: Secondary | ICD-10-CM | POA: Diagnosis not present

## 2021-06-29 DIAGNOSIS — Z5181 Encounter for therapeutic drug level monitoring: Secondary | ICD-10-CM | POA: Diagnosis not present

## 2021-07-06 ENCOUNTER — Inpatient Hospital Stay: Payer: Medicare Other | Attending: Hematology

## 2021-07-06 ENCOUNTER — Other Ambulatory Visit: Payer: Self-pay

## 2021-07-06 ENCOUNTER — Inpatient Hospital Stay (HOSPITAL_BASED_OUTPATIENT_CLINIC_OR_DEPARTMENT_OTHER): Payer: Medicare Other | Admitting: Hematology

## 2021-07-06 VITALS — BP 139/63 | HR 61 | Temp 97.6°F | Resp 16 | Ht 64.0 in | Wt 114.5 lb

## 2021-07-06 DIAGNOSIS — Z5111 Encounter for antineoplastic chemotherapy: Secondary | ICD-10-CM

## 2021-07-06 DIAGNOSIS — Z853 Personal history of malignant neoplasm of breast: Secondary | ICD-10-CM | POA: Insufficient documentation

## 2021-07-06 DIAGNOSIS — Z79899 Other long term (current) drug therapy: Secondary | ICD-10-CM | POA: Diagnosis not present

## 2021-07-06 DIAGNOSIS — C8440 Peripheral T-cell lymphoma, not classified, unspecified site: Secondary | ICD-10-CM

## 2021-07-06 DIAGNOSIS — C865 Angioimmunoblastic T-cell lymphoma: Secondary | ICD-10-CM | POA: Insufficient documentation

## 2021-07-06 DIAGNOSIS — Z95828 Presence of other vascular implants and grafts: Secondary | ICD-10-CM

## 2021-07-06 DIAGNOSIS — Z86711 Personal history of pulmonary embolism: Secondary | ICD-10-CM | POA: Insufficient documentation

## 2021-07-06 LAB — CBC WITH DIFFERENTIAL/PLATELET
Abs Immature Granulocytes: 0.01 10*3/uL (ref 0.00–0.07)
Basophils Absolute: 0 10*3/uL (ref 0.0–0.1)
Basophils Relative: 1 %
Eosinophils Absolute: 0.1 10*3/uL (ref 0.0–0.5)
Eosinophils Relative: 2 %
HCT: 37.3 % (ref 36.0–46.0)
Hemoglobin: 12 g/dL (ref 12.0–15.0)
Immature Granulocytes: 0 %
Lymphocytes Relative: 20 %
Lymphs Abs: 0.8 10*3/uL (ref 0.7–4.0)
MCH: 30.2 pg (ref 26.0–34.0)
MCHC: 32.2 g/dL (ref 30.0–36.0)
MCV: 93.7 fL (ref 80.0–100.0)
Monocytes Absolute: 0.4 10*3/uL (ref 0.1–1.0)
Monocytes Relative: 9 %
Neutro Abs: 2.7 10*3/uL (ref 1.7–7.7)
Neutrophils Relative %: 68 %
Platelets: 118 10*3/uL — ABNORMAL LOW (ref 150–400)
RBC: 3.98 MIL/uL (ref 3.87–5.11)
RDW: 14.6 % (ref 11.5–15.5)
WBC: 4 10*3/uL (ref 4.0–10.5)
nRBC: 0 % (ref 0.0–0.2)

## 2021-07-06 LAB — CMP (CANCER CENTER ONLY)
ALT: 42 U/L (ref 0–44)
AST: 36 U/L (ref 15–41)
Albumin: 3.9 g/dL (ref 3.5–5.0)
Alkaline Phosphatase: 80 U/L (ref 38–126)
Anion gap: 4 — ABNORMAL LOW (ref 5–15)
BUN: 27 mg/dL — ABNORMAL HIGH (ref 8–23)
CO2: 33 mmol/L — ABNORMAL HIGH (ref 22–32)
Calcium: 9.4 mg/dL (ref 8.9–10.3)
Chloride: 107 mmol/L (ref 98–111)
Creatinine: 0.71 mg/dL (ref 0.44–1.00)
GFR, Estimated: >60 mL/min (ref >60)
Glucose, Bld: 114 mg/dL — ABNORMAL HIGH (ref 70–99)
Potassium: 3.5 mmol/L (ref 3.5–5.1)
Sodium: 144 mmol/L (ref 135–145)
Total Bilirubin: 0.7 mg/dL (ref 0.3–1.2)
Total Protein: 5.9 g/dL — ABNORMAL LOW (ref 6.5–8.1)

## 2021-07-06 LAB — LACTATE DEHYDROGENASE: LDH: 180 U/L (ref 98–192)

## 2021-07-06 MED ORDER — HEPARIN SOD (PORK) LOCK FLUSH 100 UNIT/ML IV SOLN
500.0000 [IU] | Freq: Once | INTRAVENOUS | Status: AC
  Administered 2021-07-06: 500 [IU]

## 2021-07-06 MED ORDER — SODIUM CHLORIDE 0.9% FLUSH
10.0000 mL | Freq: Once | INTRAVENOUS | Status: AC
  Administered 2021-07-06: 10 mL

## 2021-07-07 DIAGNOSIS — I872 Venous insufficiency (chronic) (peripheral): Secondary | ICD-10-CM | POA: Diagnosis not present

## 2021-07-07 DIAGNOSIS — I671 Cerebral aneurysm, nonruptured: Secondary | ICD-10-CM | POA: Diagnosis not present

## 2021-07-07 DIAGNOSIS — C865 Angioimmunoblastic T-cell lymphoma: Secondary | ICD-10-CM | POA: Diagnosis not present

## 2021-07-07 DIAGNOSIS — G43909 Migraine, unspecified, not intractable, without status migrainosus: Secondary | ICD-10-CM | POA: Diagnosis not present

## 2021-07-07 DIAGNOSIS — G629 Polyneuropathy, unspecified: Secondary | ICD-10-CM | POA: Diagnosis not present

## 2021-07-07 DIAGNOSIS — I10 Essential (primary) hypertension: Secondary | ICD-10-CM | POA: Diagnosis not present

## 2021-07-12 DIAGNOSIS — G43909 Migraine, unspecified, not intractable, without status migrainosus: Secondary | ICD-10-CM | POA: Diagnosis not present

## 2021-07-12 DIAGNOSIS — I10 Essential (primary) hypertension: Secondary | ICD-10-CM | POA: Diagnosis not present

## 2021-07-12 DIAGNOSIS — I671 Cerebral aneurysm, nonruptured: Secondary | ICD-10-CM | POA: Diagnosis not present

## 2021-07-12 DIAGNOSIS — I872 Venous insufficiency (chronic) (peripheral): Secondary | ICD-10-CM | POA: Diagnosis not present

## 2021-07-12 DIAGNOSIS — C865 Angioimmunoblastic T-cell lymphoma: Secondary | ICD-10-CM | POA: Diagnosis not present

## 2021-07-12 DIAGNOSIS — G629 Polyneuropathy, unspecified: Secondary | ICD-10-CM | POA: Diagnosis not present

## 2021-07-13 ENCOUNTER — Encounter: Payer: Self-pay | Admitting: Hematology

## 2021-07-13 NOTE — Addendum Note (Signed)
Addended by: Sullivan Lone on: 07/13/2021 12:30 AM   Modules accepted: Orders

## 2021-07-13 NOTE — Progress Notes (Signed)
HEMATOLOGY/ONCOLOGY CLINIC NOTE  Date of Service: .07/06/2021   Patient Care Team: Lajean Manes, MD as PCP - General (Internal Medicine) Buford Dresser, MD as PCP - Cardiology (Cardiology)  REFERRING PHYSICIAN: Lajean Manes, MD  CHIEF COMPLAINTS/PURPOSE OF CONSULTATION:   Continued follow-up for evaluation and management of angioblastic T-cell lymphoma  HISTORY OF PRESENTING ILLNESS:  Please see previous notes for details on initial presentation  INTERVAL HISTORY:  Sarah [name] is here for follow-up accompanied by her daughter for further evaluation and management of her angioimmunoblastic T-cell lymphoma. Her last clinic visit with [place] was about 4 months ago. She notes that she has had some issues with lower extremity edema which are being managed by her primary care physician She had visited the emergency room and was admitted for strokelike symptoms and severe headaches on 05/12/2021.  Headaches were thought to be related to PRES in the context of uncontrolled hypertension.  Noted to have a 2 mm right ICA aneurysm which is being followed by Dr. Kathyrn Sheriff.  She notes no new skin lesions.  No new pruritus.  No new lumps or bumps. No fevers no chills no night sweats. Weight is up about 13 pounds since her last clinic visit however most of this appears to be some element of fluid retention.  Labs done today CBC stable with normal hemoglobin 12, WBC count of 4k and platelets of 118k LDH within normal limits at 180 CMP stable mild contraction alkalosis with bicarb of 33.   MEDICAL HISTORY:  Past Medical History:  Diagnosis Date   Allergy    codeine, thiazides   Anxiety    new dx   Arthritis    Breast cancer ([place]) 03/14/12   bx=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,ER/PR=+,upper inner quad   Cancer ([facility])    breast   Cataract    DVT (deep venous thrombosis) ([place]) 02/2019   left leg   Dyspnea    Edema    both legs feet and toe, abdomen    Glaucoma    laser treated years ago   HOH (hard of hearing)    Hyperlipemia    Hypertension    Hypothyroidism    Pancreatic cyst    benign   PONV (postoperative nausea and vomiting)    Pulmonary embolism ([place]) 02/2019   bilateral    Radiation 06/11/2012-07/12/2012   17 sessions 4250 cGy, 3 sessions 750 cGy   Vertigo    Wears glasses    Wears partial dentures    partial upper     SURGICAL HISTORY: Past Surgical History:  Procedure Laterality Date   ABDOMINAL HYSTERECTOMY  1966   1/2 ovary left in    [place]   lumpectomy-lt   CATARACT EXTRACTION     b/l   COLONOSCOPY     EXCISION MASS NECK Left 03/17/2019    EXCISION MASS NECK (Left Neck)   EXCISION MASS NECK Left 03/17/2019   Procedure: EXCISION MASS NECK;  Surgeon: Helayne Seminole, MD;  Location: [facility] OR;  Service: ENT;  Laterality: Left;   EYE SURGERY Bilateral    bilateral cataract removal   IR IMAGING GUIDED PORT INSERTION  04/23/2019   JOINT REPLACEMENT  2013   rt total knee   JOINT REPLACEMENT  1995   lt total knee   PARTIAL MASTECTOMY WITH NEEDLE LOCALIZATION  04/09/2012   Procedure: PARTIAL MASTECTOMY WITH NEEDLE LOCALIZATION;  Surgeon: Adin Hector, MD;  Location: [facility];  Service: General;  Laterality:  Right;   SKIN BIOPSY Left 03/17/2019   LEFT THIGH   SKIN BIOPSY Left 03/17/2019   Procedure: Skin Biopsy Left Thigh;  Surgeon: Helayne Seminole, MD;  [facility];  Service: ENT;  Laterality: Left;   TONSILLECTOMY     TOTAL KNEE ARTHROPLASTY  08/16/2011   Procedure: TOTAL KNEE ARTHROPLASTY;  Surgeon: Ninetta Lights, MD;  Location: [facility];  Service: Orthopedics;  Laterality: Right;     SOCIAL HISTORY: Social History   Socioeconomic History   Marital status: Widowed    Spouse name: Not on file   Number of children: 2   Years of education: Not on file   Highest education level: Not on file  Occupational History   Occupation: Retired    Comment: Landscape architect   Tobacco Use   Smoking status: Never   Smokeless tobacco: Never  Vaping Use   Vaping Use: Never used  Substance and Sexual Activity   Alcohol use: No   Drug use: No   Sexual activity: Not Currently  Other Topics Concern   Not on file  Social History Narrative   Not on file   Social Determinants of Health   Financial Resource Strain: Not on file  Food Insecurity: Not on file  Transportation Needs: Not on file  Physical Activity: Not on file  Stress: Not on file  Social Connections: Not on file  Intimate Partner Violence: Not on file     FAMILY HISTORY: Family History  Problem Relation Age of Onset   Heart disease Father    Cancer Maternal Aunt        stomach     ALLERGIES:   is allergic to citalopram, alendronate sodium, amoxicillin-pot clavulanate, codeine, latex, lisinopril, septra [sulfamethoxazole-trimethoprim], and thiazide-type diuretics.   MEDICATIONS:  Current Outpatient Medications  Medication Sig Dispense Refill   acetaminophen (TYLENOL) 500 MG tablet Take 1,000 mg by mouth every 6 (six) hours as needed for moderate pain.     atorvastatin (LIPITOR) 20 MG tablet Take 1 tablet (20 mg total) by mouth daily. 30 tablet 3   B Complex-C (SUPER B COMPLEX PO) Take 1 tablet by mouth daily.     carvedilol (COREG) 25 MG tablet Take 25 mg by mouth 2 (two) times daily.     Cholecalciferol (VITAMIN D) 50 MCG (2000 UT) tablet Take 2,000 Units by mouth 2 (two) times daily.     citalopram (CELEXA) 10 MG tablet 1/2 tablet     ELIQUIS 2.5 MG TABS tablet TAKE 1 TABLET TWICE A DAY (Patient taking differently: Take 2.5 mg by mouth 2 (two) times daily.) 180 tablet 1   furosemide (LASIX) 20 MG tablet Take 20 mg by mouth. M W F = 2 tabs daily, T TH S S 1 tab daily     hydrALAZINE (APRESOLINE) 50 MG tablet Take 1 tablet (50 mg total) by mouth 3 (three) times daily. 90 tablet 3   levothyroxine (SYNTHROID) 25 MCG tablet Take 25 mcg by mouth daily before breakfast.       lidocaine-prilocaine (EMLA) cream Apply to affected area once (Patient taking differently: Apply 1 application topically See admin instructions. Apply to port site prior to port access) 30 g 3   Multiple Vitamins-Minerals (PRESERVISION AREDS PO) Take 1 capsule by mouth 2 (two) times daily.      ondansetron (ZOFRAN) 8 MG tablet Take 1 tablet (8 mg total) by mouth 2 (two) times daily as needed. Start on the third day after chemotherapy. 30 tablet  1   Polyethyl Glycol-Propyl Glycol (SYSTANE) 0.4-0.3 % GEL ophthalmic gel Place 1 application into both eyes daily as needed (dry eyes).     sodium chloride (MURO 128) 5 % ophthalmic ointment Place 1 application into both eyes at bedtime.     XIIDRA 5 % SOLN Place 1 drop into both eyes daily as needed (dry eyes).     zolpidem (AMBIEN) 5 MG tablet Take 5 mg by mouth at bedtime.     carvedilol (COREG) 12.5 MG tablet Take 1 tablet (12.5 mg total) by mouth 2 (two) times daily with a meal. (Patient not taking: Reported on 07/06/2021) 60 tablet 3   citalopram (CELEXA) 10 MG tablet Take 5 mg by mouth daily. (Patient not taking: Reported on 07/06/2021)     No current facility-administered medications for this visit.     REVIEW OF SYSTEMS:    10 Point review of Systems was done is negative except as noted above.  PHYSICAL EXAMINATION:  .BP 139/63 (BP Location: Left Arm, Patient Position: Sitting)    Pulse 61    Temp 97.6 F (36.4 C) (Temporal)    Resp 16    Ht 5\' 4"  (1.626 m)    Wt 114 lb 8 oz (51.9 kg)    SpO2 97%    BMI 19.65 kg/m  .  NAD GENERAL:alert, in no acute distress and comfortable SKIN: no acute rashes, no significant lesions EYES: conjunctiva are pink and non-injected, sclera anicteric OROPHARYNX: MMM, no exudates, no oropharyngeal erythema or ulceration NECK: supple, no JVD LYMPH:  no palpable lymphadenopathy in the cervical, axillary or inguinal regions LUNGS: clear to auscultation b/l with normal respiratory effort HEART: regular rate &  rhythm ABDOMEN:  normoactive bowel sounds , non tender, not distended. Extremity: Bilateral 2+ pitting pedal edema PSYCH: alert & oriented x 3 with fluent speech NEURO: no focal motor/sensory deficits   LABORATORY DATA:  I have reviewed the data as listed  CBC Latest Ref Rng & Units 07/06/2021 05/16/2021 05/14/2021  WBC 4.0 - 10.5 K/uL 4.0 5.6 4.5  Hemoglobin 12.0 - 15.0 g/dL 12.0 13.9 14.3  Hematocrit 36.0 - 46.0 % 37.3 42.8 44.7  Platelets 150 - 400 K/uL 118(L) 141(L) 157    CMP Latest Ref Rng & Units 07/06/2021 05/17/2021 05/16/2021  Glucose 70 - 99 mg/dL 114(H) 91 95  BUN 8 - 23 mg/dL 27(H) 32(H) 29(H)  Creatinine 0.44 - 1.00 mg/dL 0.71 0.67 0.70  Sodium 135 - 145 mmol/L 144 140 140  Potassium 3.5 - 5.1 mmol/L 3.5 4.0 3.2(L)  Chloride 98 - 111 mmol/L 107 106 105  CO2 22 - 32 mmol/L 33(H) 28 26  Calcium 8.9 - 10.3 mg/dL 9.4 9.1 9.1  Total Protein 6.5 - 8.1 g/dL 5.9(L) - -  Total Bilirubin 0.3 - 1.2 mg/dL 0.7 - -  Alkaline Phos 38 - 126 U/L 80 - -  AST 15 - 41 U/L 36 - -  ALT 0 - 44 U/L 42 - -   . Lab Results  Component Value Date   LDH 180 07/06/2021    Component     Latest Ref Rng & Units 03/27/2019  Hepatitis B Surface Ag     NON REACTIVE NON REACTIVE  Hep B Core Total Ab     NON REACTIVE NON REACTIVE  HCV Ab     NON REACTIVE NON REACTIVE  LDH     98 - 192 U/L 252 (H)       04/08/2019 NM PET Image Initial (  PI) Skull Base To Thigh (Accession [id number])  04/04/2019 PATHOLOGY   04/07/2019 ECHOCARDIOGRAM  03/28/2019 PATHOLOGY   03/06/2019 CT ANGIOGRAPHY CHEST WITH CONTRAST (Accession [id number])  04/07/2019 ECHOCARDIOGRAM  03/06/2019 ECHOCARDIOGRAM     03/06/2019 PORTABLE CHEST 1 VIEW (Accession [id number])    02/27/2019 CT CHEST, ABDOMEN, AND PELVIS WITH CONTRAST (Accession [id number])    02/17/2019 CHEST XRAY - 2 VIEW (Accession [id number])    RADIOGRAPHIC STUDIES: I have personally reviewed the radiological images as listed and agreed  with the findings in the report. No results found.   ASSESSMENT & PLAN:   Sarah [name] is a 88 y.o. female with:  1. 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 SKIN, LEFT THIGH, BIOPSY: - Involvement by angioimmunoblastic T-cell lymphoma." -04/07/2019 Echocardiogram - normal EF -04/08/2019 NM PET Image Initial (PI) Skull Base To Thigh (Accession [id number]) revealed "1. Widespread hypermetabolic adenopathy in the neck, chest, and abdomen/pelvis, primarily in the Deauville 4 and Deauville 5 range. There is also hypermetabolic activity along the posterior nasopharynx in lingual tonsillar regions potentially indicating sites of involvement. 2. The subtle hypodensities in the spine and bony pelvis are not appreciably hypermetabolic may be incidental, surveillance is suggested. 3. Bilateral thyroid activity but especially on the left. Probably from thyroiditis. 4. A 6 mm subpleural nodule anteriorly in the right lower lobe not appreciably hypermetabolic but is below sensitive PET-CT size thresholds and merits surveillance. 5. Mildly accentuated diffuse splenic activity without splenomegaly or focal splenic lesion identified. 6. Other imaging findings of potential clinical significance: Aortic Atherosclerosis (ICD10-I70.0). Coronary atherosclerosis. Large right and small left pleural effusions. Moderate cardiomegaly. Small pericardial effusion. Mild mesenteric edema. Large left renal cyst. Fullness of both renal collecting systems with suspected nonobstructive left nephrolithiasis. Degenerative grade 1 anterolisthesis at L4-5 at L5-S1. Trace pelvic ascites." -05/28/2019 PET/CT ([id number]) revealed "Complete metabolic response to therapy". -09/16/2019 PET/CT ([id number]) revealed "1. Unfortunately evidence of lymphoma recurrence at sites of  previous hypermetabolic adenopathy. Hypermetabolic lymphoid tissue is new from comparison PET-CT 05/28/2019 but at same locations as PET-CT 04/08/2019. The number of metastatic lymph nodes is less than 04/08/2019. Lymphoid tissue at the RIGHT skull base is larger. 2. Three foci of hypermetabolic recurrence are present, lymphoid tissue at the RIGHT base of skull, hypermetabolic RIGHT hilar lymph node and hypermetabolic lymph node in the porta hepatis." -11/25/2019 PET/CT ([id number]) revealed "1. Marked interval improvement in the hypermetabolic disease identified previously in the posterior right nasopharynx, compatible with Deauville 4 today. 2. Decreased hypermetabolism in the right hilar and porta hepatis lymph nodes identified previously (Deauville 3). No new sites of hypermetabolic disease in the chest, abdomen, or pelvis. 3. Similar diffuse thyroid uptake, left greater than right. 4. No substantial change in right-sided pulmonary nodules." -04/09/2020 PET/CT ([id number]) revealed "No evidence progressive lymphoma."  2. Pulmonary Embolism -extensive likely related to extensive malignancy  3. H/o Breast Cancer  -The patient had bilateral diagnostic  mammography at [facility] 07/11/2011. This  showed some calcifications in the right  breast, which seemed a little bit more  prominent than prior. In the left breast  there was an area of possible  architectural distortion. However left  breast ultrasound the same day showed  no abnormality. 6 month followup was  suggested, and on 02/28/2012 the  patient again had bilateral diagnostic  mammography, now with right  ultrasonography. The microcalcifications  in the right breast appeared increased.  Ultrasound showed a hypoechoic  lesion  measuring 5 mm, with no associated  shadowing. This had been previously  noted and appeared unchanged. Anterior  to that there was a small hypoechoic  mass measuring 4 mm in diameter.  There was felt to be suspicious, and on   03/14/2012 the patient underwent biopsy  of the right breast mass, showing ([id number]  [id number]) ductal carcinoma in situ,  intermediate grade, estrogen receptor  100% and progesterone receptor 100%  positive.    -Bilateral breast MRI was obtained  03/26/2012. This showed only post  biopsy changes in the right breast,  associated with an area of non-masslike  enhancement measuring 2.4 cm. The  left breast was unremarkable, and there  was no enlarged axillary or internal  mammary adenopathy noted.  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 The patient's  subsequent history is as detailed below.   PLAN: -Patient has stable CBC with mild thrombocytopenia of 118k and normal hemoglobin and WBC count CMP stable LDH within normal limits -Patient has no obvious lab or clinical evidence of progression/recurrence of her immunoblastic T-cell lymphoma. -No clear indication for additional treatment of her lymphoma at this time. -She is continue to follow with her primary care physician for management of lower extremity swelling and is on medications for this.  I also recommended using bilateral lower extremity knee-high compression socks. -She will continue to be on Eliquis for history of significant PE. -Daughter is very supportive and has been helping her mother  4.  History of possible CVA, possible PRESS, right ICA aneurysm PLan -Continue follow-up with Dr. Tasia Catchings neurology and Dr. Kathyrn Sheriff neurosurgery as per inpatient plan. Follow-up   Return to clinic with Dr. Irene Limbo in 3 months    All of the patient's questions were answered with apparent satisfaction. The patient knows to call the clinic with any problems, questions or concerns.    Sullivan Lone MD [place] [facility]IVMS [facility] Hematology/Oncology Physician [facility]

## 2021-07-19 DIAGNOSIS — G629 Polyneuropathy, unspecified: Secondary | ICD-10-CM | POA: Diagnosis not present

## 2021-07-19 DIAGNOSIS — I671 Cerebral aneurysm, nonruptured: Secondary | ICD-10-CM | POA: Diagnosis not present

## 2021-07-19 DIAGNOSIS — C865 Angioimmunoblastic T-cell lymphoma: Secondary | ICD-10-CM | POA: Diagnosis not present

## 2021-07-19 DIAGNOSIS — I10 Essential (primary) hypertension: Secondary | ICD-10-CM | POA: Diagnosis not present

## 2021-07-19 DIAGNOSIS — I872 Venous insufficiency (chronic) (peripheral): Secondary | ICD-10-CM | POA: Diagnosis not present

## 2021-07-19 DIAGNOSIS — G43909 Migraine, unspecified, not intractable, without status migrainosus: Secondary | ICD-10-CM | POA: Diagnosis not present

## 2021-07-20 DIAGNOSIS — Z23 Encounter for immunization: Secondary | ICD-10-CM | POA: Diagnosis not present

## 2021-07-20 DIAGNOSIS — I700 Atherosclerosis of aorta: Secondary | ICD-10-CM | POA: Diagnosis not present

## 2021-07-20 DIAGNOSIS — E7800 Pure hypercholesterolemia, unspecified: Secondary | ICD-10-CM | POA: Diagnosis not present

## 2021-07-20 DIAGNOSIS — I10 Essential (primary) hypertension: Secondary | ICD-10-CM | POA: Diagnosis not present

## 2021-07-26 ENCOUNTER — Ambulatory Visit (INDEPENDENT_AMBULATORY_CARE_PROVIDER_SITE_OTHER): Payer: Medicare Other | Admitting: Family Medicine

## 2021-07-26 ENCOUNTER — Encounter: Payer: Self-pay | Admitting: Family Medicine

## 2021-07-26 VITALS — BP 141/75 | HR 61 | Ht 64.0 in | Wt 111.0 lb

## 2021-07-26 DIAGNOSIS — I6330 Cerebral infarction due to thrombosis of unspecified cerebral artery: Secondary | ICD-10-CM

## 2021-07-26 NOTE — Progress Notes (Signed)
I agree with the above plan 

## 2021-07-26 NOTE — Patient Instructions (Addendum)
Below is our plan: ? ? ?HTN: BP goal <130/90.  Stable on carvedilol 25mg  BID ? Hydralazine 50mg  TID. Strict BP control at home.  ?HLD: LDL goal <70. Recent LDL 98. Continue atorvastatin 20mg  daily ?Angioimmunoblastic T cell lymphoma: continue close follow up with oncology as directed.  ?28mm right ICA aneurysm: continue monitoring with Dr Kathyrn Sheriff. I will order repeat MRI to assess for any changes.  ?Hx DVT and PE: continue Eliquis 2.5mg  BID per PCP.  ? ?Please make sure you are staying well hydrated. I recommend 50-60 ounces daily. Well balanced diet and regular exercise encouraged. Consistent sleep schedule with 6-8 hours recommended.  ? ?Please continue follow up with care team as directed.  ? ?Follow up with me in 4-6 months ? ?You may receive a survey regarding today's visit. I encourage you to leave honest feed back as I do use this information to improve patient care. Thank you for seeing me today!  ?  ? ?Management of Memory Problems ?  ?There are some general things you can do to help manage your memory problems.  Your memory may not in fact recover, but by using techniques and strategies you will be able to manage your memory difficulties better. ?  ?1)  Establish a routine. ?Try to establish and then stick to a regular routine.  By doing this, you will get used to what to expect and you will reduce the need to rely on your memory.  Also, try to do things at the same time of day, such as taking your medication or checking your calendar first thing in the morning. ?Think about think that you can do as a part of a regular routine and make a list.  Then enter them into a daily planner to remind you.  This will help you establish a routine. ?  ?2)  Organize your environment. ?Organize your environment so that it is uncluttered.  Decrease visual stimulation.  Place everyday items such as keys or cell phone in the same place every day (ie.  Basket next to front door) ?Use post it notes with a brief message to  yourself (ie. Turn off light, lock the door) ?Use labels to indicate where things go (ie. Which cupboards are for food, dishes, etc.) ?Keep a notepad and pen by the telephone to take messages ?  ?3)  Memory Aids ?A diary or journal/notebook/daily planner ?Making a list (shopping list, chore list, to do list that needs to be done) ?Using an alarm as a reminder (kitchen timer or cell phone alarm) ?Using cell phone to store information (Notes, Calendar, Reminders) ?Calendar/White board placed in a prominent position ?Post-it notes ?  ?In order for memory aids to be useful, you need to have good habits.  It's no good remembering to make a note in your journal if you don't remember to look in it.  Try setting aside a certain time of day to look in journal. ?  ?4)  Improving mood and managing fatigue. ?There may be other factors that contribute to memory difficulties.  Factors, such as anxiety, depression and tiredness can affect memory. ?Regular gentle exercise can help improve your mood and give you more energy. ?Simple relaxation techniques may help relieve symptoms of anxiety ?Try to get back to completing activities or hobbies you enjoyed doing in the past. ?Learn to pace yourself through activities to decrease fatigue. ?Find out about some local support groups where you can share experiences with others. ?Try and achieve 7-8 hours of  sleep at night. ? ?

## 2021-07-26 NOTE — Progress Notes (Signed)
[facility] [address] [place] [address] [phone]       HOSPITAL FOLLOW UP NOTE  [name] Date of Birth:  1933/03/14 Medical Record Number:  [id number]   Reason for Referral:  hospital stroke follow up    SUBJECTIVE:   CHIEF COMPLAINT:  Chief Complaint  Patient presents with   Follow-up    RM 1 with daughter, Sarah [name].  In hospital around Christmas 05/14/21-05/17/21/[place]. Dx w/ aneurysm/ stroke. No headache since being discharged. Ambulates w/ walker.   Daughter does not think her mother remembers being told she has aneurysm. Does not want this brought up. Has hx lymphoma. On blood thinner.   Stress    HPI:   Sarah [name] is a 88 y.o. who  has a past medical history of Allergy, Anxiety, Arthritis, Breast cancer ([place]) (03/14/12), Cancer ([place]), Cataract, DVT (deep venous thrombosis) ([place]) (02/2019), Dyspnea, Edema, Glaucoma, HOH (hard of hearing), Hyperlipemia, Hypertension, Hypothyroidism, Pancreatic cyst, PONV (postoperative nausea and vomiting), Pulmonary embolism ([place]) (02/2019), Radiation (06/11/2012-07/12/2012), Vertigo, Wears glasses, and Wears partial dentures.  Patient presented on 05/14/2021 with an intractable headache. Initial evaluation at [facility] where CT angio showed 68mm right ICA aneurysm. Headache completely resolved with migraine cocktail and she was planning discharge. Unfortunately, her headache worsened while in the ER and BP was elevated (initially 156/90 then increasing to 200 SBP). She was transferred to [facility] for MRI which showed changes consistent with PRES. Personally reviewed hospitalization pertinent progress notes, lab work and imaging.  Evaluated by Dr Erlinda Hong.   She presents with her daughter, Sarah [name], today who aids in history. Sarah [name] is doing well, today. She feels that she is back to baseline. She is more tired than prior to event but, overall, doing well. She has seen Dr Felipa Eth, PCP, in follow  up. BP is being checked at home. Readings range from 113 sys to 150-160 sys. Dr Felipa Eth is following up with her in July. She continues hydralazine 50mg  TID and carvedilol 25mg  BID. She denies headaches. No dizziness or vision changes. Her daughter is concerned that she may be more forgetful, recently. She had some difficulty before her hospitalization but it may be a little worse since coming home. Gait is stable. She uses a walker. She lives alone. She has help stepping into and out of the shower. She is able to get dressed and bathed independently. Her daughter doses medications weekly. She is able to take medications from pill caddy appropriately. She does not drive due to peripheral neuropathy. No significant family history of dementia.    PERTINENT IMAGING/LABS  MRI  12/25 - a punctate cortical infarct in the right parietal lobe  MRI 12/26 - punctate focus of restricted diffusion in the right parietal lobe and left parietal lobe CTA head and neck - unremarkable, 37mm right ICA siphon aneurysm 2D Echo  EF 55-60%  A1C Lab Results  Component Value Date   HGBA1C 5.2 05/16/2021    Lipid Panel     Component Value Date/Time   CHOL 189 05/16/2021 0805   TRIG 75 05/16/2021 0805   HDL 76 05/16/2021 0805   CHOLHDL 2.5 05/16/2021 0805   VLDL 15 05/16/2021 0805   LDLCALC 98 05/16/2021 0805      ROS:   14 system review of systems performed and negative with exception of those listed in HPI  PMH:  Past Medical History:  Diagnosis Date   Allergy    codeine, thiazides   Anxiety  new dx   Arthritis    Breast cancer ([place]) 03/14/12   bx=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,ER/PR=+,upper inner quad   Cancer ([facility])    breast   Cataract    DVT (deep venous thrombosis) ([place]) 02/2019   left leg   Dyspnea    Edema    both legs feet and toe, abdomen   Glaucoma    laser treated years ago   HOH (hard of hearing)    Hyperlipemia    Hypertension    Hypothyroidism     Pancreatic cyst    benign   PONV (postoperative nausea and vomiting)    Pulmonary embolism ([place]) 02/2019   bilateral    Radiation 06/11/2012-07/12/2012   17 sessions 4250 cGy, 3 sessions 750 cGy   Vertigo    Wears glasses    Wears partial dentures    partial upper    PSH:  Past Surgical History:  Procedure Laterality Date   ABDOMINAL HYSTERECTOMY  1966   1/2 ovary left in    [place]   lumpectomy-lt   CATARACT EXTRACTION     b/l   COLONOSCOPY     EXCISION MASS NECK Left 03/17/2019    EXCISION MASS NECK (Left Neck)   EXCISION MASS NECK Left 03/17/2019   Procedure: EXCISION MASS NECK;  Surgeon: Helayne Seminole, MD;  Location: [facility];  Service: ENT;  Laterality: Left;   EYE SURGERY Bilateral    bilateral cataract removal   IR IMAGING GUIDED PORT INSERTION  04/23/2019   JOINT REPLACEMENT  2013   rt total knee   JOINT REPLACEMENT  1995   lt total knee   PARTIAL MASTECTOMY WITH NEEDLE LOCALIZATION  04/09/2012   Procedure: PARTIAL MASTECTOMY WITH NEEDLE LOCALIZATION;  Surgeon: Adin Hector, MD;  Location: [facility];  Service: General;  Laterality: Right;   SKIN BIOPSY Left 03/17/2019   LEFT THIGH   SKIN BIOPSY Left 03/17/2019   Procedure: Skin Biopsy Left Thigh;  Surgeon: Helayne Seminole, MD;  Location: [facility];  Service: ENT;  Laterality: Left;   TONSILLECTOMY     TOTAL KNEE ARTHROPLASTY  08/16/2011   Procedure: TOTAL KNEE ARTHROPLASTY;  Surgeon: Ninetta Lights, MD;  Location: [facility];  Service: Orthopedics;  Laterality: Right;    Social History:  Social History   Socioeconomic History   Marital status: Widowed    Spouse name: Not on file   Number of children: 2   Years of education: Not on file   Highest education level: Not on file  Occupational History   Occupation: Retired    Comment: Community education officer   Tobacco Use   Smoking status: Never   Smokeless tobacco: Never  Vaping Use   Vaping Use: Never used  Substance and Sexual  Activity   Alcohol use: No   Drug use: No   Sexual activity: Not Currently  Other Topics Concern   Not on file  Social History Narrative   Not on file   Social Determinants of Health   Financial Resource Strain: Not on file  Food Insecurity: Not on file  Transportation Needs: Not on file  Physical Activity: Not on file  Stress: Not on file  Social Connections: Not on file  Intimate Partner Violence: Not on file    Family History:  Family History  Problem Relation Age of Onset   Heart disease Father    Cancer Maternal Aunt        stomach  Medications:   Current Outpatient Medications on File Prior to Visit  Medication Sig Dispense Refill   acetaminophen (TYLENOL) 500 MG tablet Take 1,000 mg by mouth every 6 (six) hours as needed for moderate pain.     atorvastatin (LIPITOR) 20 MG tablet Take 1 tablet (20 mg total) by mouth daily. 30 tablet 3   B Complex-C (SUPER B COMPLEX PO) Take 1 tablet by mouth daily.     carvedilol (COREG) 25 MG tablet Take 25 mg by mouth 2 (two) times daily.     Cholecalciferol (VITAMIN D) 50 MCG (2000 UT) tablet Take 2,000 Units by mouth 2 (two) times daily.     citalopram (CELEXA) 10 MG tablet Take 10 mg by mouth daily.     ELIQUIS 2.5 MG TABS tablet TAKE 1 TABLET TWICE A DAY (Patient taking differently: Take 2.5 mg by mouth 2 (two) times daily.) 180 tablet 1   furosemide (LASIX) 20 MG tablet Take 20 mg by mouth. M W F = 2 tabs daily, T TH S S 1 tab daily     levothyroxine (SYNTHROID) 25 MCG tablet Take 25 mcg by mouth daily before breakfast.      lidocaine-prilocaine (EMLA) cream Apply to affected area once (Patient taking differently: Apply 1 application. topically See admin instructions. Apply to port site prior to port access) 30 g 3   Multiple Vitamins-Minerals (PRESERVISION AREDS PO) Take 1 capsule by mouth 2 (two) times daily.      ondansetron (ZOFRAN) 8 MG tablet Take 1 tablet (8 mg total) by mouth 2 (two) times daily as needed. Start on the  third day after chemotherapy. 30 tablet 1   Polyethyl Glycol-Propyl Glycol (SYSTANE) 0.4-0.3 % GEL ophthalmic gel Place 1 application into both eyes daily as needed (dry eyes).     sodium chloride (MURO 128) 5 % ophthalmic ointment Place 1 application into both eyes at bedtime.     XIIDRA 5 % SOLN Place 1 drop into both eyes daily as needed (dry eyes).     zolpidem (AMBIEN) 5 MG tablet Take 5 mg by mouth at bedtime.     hydrALAZINE (APRESOLINE) 50 MG tablet Take 1 tablet (50 mg total) by mouth 3 (three) times daily. 90 tablet 3   No current facility-administered medications on file prior to visit.    Allergies:   Allergies  Allergen Reactions   Alendronate Sodium Other (See Comments)   Amoxicillin-Pot Clavulanate Other (See Comments)   Codeine Other (See Comments)    Reaction not recalled   Latex    Lisinopril Other (See Comments)   Septra [Sulfamethoxazole-Trimethoprim] Other (See Comments)   Thiazide-Type Diuretics Other (See Comments)    Blurry vision?? (patient stated she has macular degeneration)      OBJECTIVE:  Physical Exam  Vitals:   07/26/21 1251  BP: (!) 141/75  Pulse: 61  SpO2: 98%  Weight: 111 lb (50.3 kg)  Height: 5\' 4"  (1.626 m)   Body mass index is 19.05 kg/m. No results found.  No flowsheet data found.   General: well developed, well nourished, seated, in no evident distress Head: head normocephalic and atraumatic.   Neck: supple with no carotid or supraclavicular bruits Cardiovascular: regular rate and rhythm, no murmurs Musculoskeletal: no deformity Skin:  no rash/petichiae Vascular:  Normal pulses all extremities   Neurologic Exam Mental Status: Awake and fully alert.  Fluent speech and language.  Oriented to place and time. Recent and remote memory intact. Attention span, concentration and fund of knowledge  appropriate. Mood and affect appropriate.  Cranial Nerves: Fundoscopic exam reveals sharp disc margins. Pupils equal, briskly reactive to  light. Extraocular movements full without nystagmus. Visual fields full to confrontation. Hearing intact. Facial sensation intact. Face, tongue, palate moves normally and symmetrically.  Motor: Normal bulk and tone. Normal strength in all tested extremity muscles Sensory.: intact to touch   Coordination: Rapid alternating movements normal in all extremities. Finger-to-nose and heel-to-shin performed accurately bilaterally. Gait and Station: Arises from chair without difficulty. Stance is normal. Gait demonstrates normal stride length and balance with walker. Tandem walk and heel toe not attempted today.  Reflexes: 1+ and symmetric.    NIHSS  0 Modified Rankin  1    ASSESSMENT: RASHEDA LEDGER is a 88 y.o. year old female who presented to the ER 05/14/2021 with an intractable headache in the setting of uncontrolled HTN. Vascular risk factors include HTN, HLD, advanced age, hx DVT and PE on Eliquis.    PLAN:  Stroke, incidental finding, punctate infarcts at b/l parietal lobes - possible ischemic PRES or microembolic infarcts : Residual deficit: no obvious physical deficit, potential cognitive decline. Continue Eliquis (apixaban) daily  and atorvastatin  for secondary stroke prevention.  Discussed secondary stroke prevention measures and importance of close PCP follow up for aggressive stroke risk factor management. I have gone over the pathophysiology of stroke, warning signs and symptoms, risk factors and their management in some detail with instructions to go to the closest emergency room for symptoms of concern. HTN: BP goal <130/90.  Stable on carvedilol 25mg  BID ? Hydralazine 50mg  TID. Strict BP control at home.  HLD: LDL goal <70. Recent LDL 98. Continue atorvastatin 20mg  daily DMII: A1c goal<7.0. Recent A1c 5.2.  Angioimmunoblastic T cell lymphoma: continue close follow up with oncology as directed.  28mm right ICA aneurysm: continue monitoring with Dr Kathyrn Sheriff Hx DVT and PE: continue  Eliquis 2.5mg  BID per PCP.  Memory loss: continue to monitor, consider neurocognitive evaluation, memory compensation strategies encouraged.    Follow up in 6 months or call earlier if needed   CC:  [facility] provider: Dr. Leonie Man PCP: Sarah Manes, MD    I spent 45 minutes of face-to-face and non-face-to-face time with patient.  This included previsit chart review including review of recent hospitalization, lab review, study review, order entry, electronic health record documentation, patient education regarding recent stroke including etiology, secondary stroke prevention measures and importance of managing stroke risk factors, residual deficits and typical recovery time and answered all other questions to patient satisfaction   Debbora Presto, [facility]  [facility] [address] [place], [place] [address]  Phone [phone] Fax [phone] Note: This document was prepared with digital dictation and possible smart phrase technology. Any transcriptional errors that result from this process are unintentional.

## 2021-08-02 ENCOUNTER — Ambulatory Visit (INDEPENDENT_AMBULATORY_CARE_PROVIDER_SITE_OTHER): Payer: Medicare Other | Admitting: Podiatry

## 2021-08-02 ENCOUNTER — Other Ambulatory Visit: Payer: Self-pay

## 2021-08-02 ENCOUNTER — Encounter: Payer: Self-pay | Admitting: Podiatry

## 2021-08-02 DIAGNOSIS — M79675 Pain in left toe(s): Secondary | ICD-10-CM | POA: Diagnosis not present

## 2021-08-02 DIAGNOSIS — M205X2 Other deformities of toe(s) (acquired), left foot: Secondary | ICD-10-CM

## 2021-08-02 DIAGNOSIS — L84 Corns and callosities: Secondary | ICD-10-CM

## 2021-08-02 DIAGNOSIS — D689 Coagulation defect, unspecified: Secondary | ICD-10-CM | POA: Diagnosis not present

## 2021-08-02 DIAGNOSIS — G629 Polyneuropathy, unspecified: Secondary | ICD-10-CM | POA: Diagnosis not present

## 2021-08-02 DIAGNOSIS — B351 Tinea unguium: Secondary | ICD-10-CM

## 2021-08-02 DIAGNOSIS — M79674 Pain in right toe(s): Secondary | ICD-10-CM

## 2021-08-02 NOTE — Progress Notes (Signed)
This patient returns to my office for at risk foot care.  This patient requires this care by a professional since this patient will be at risk due to having neuropathy  and coagulation defect.  Patient is taking eliquis.This patient is unable to cut nails herself since the patient cannot reach her nails.These nails are painful walking and wearing shoes.  She has painful corn on the tip of her third toe left foot. She presents to the office with her daughter.This patient presents for at risk foot care today. ? ?General Appearance  Alert, conversant and in no acute stress. ? ?Vascular  Dorsalis pedis and posterior tibial  pulses are  weakly palpable  bilaterally.  Capillary return is within normal limits  bilaterally. Temperature is within normal limits  bilaterally.Digital hair  noted.   ? ?Neurologic  Senn-Weinstein monofilament wire test within normal limits  bilaterally. Muscle power within normal limits bilaterally. ? ?Nails Thick disfigured discolored nails with subungual debris  from hallux to fifth toes bilaterally. No evidence of bacterial infection or drainage bilaterally. ? ?Orthopedic  No limitations of motion  feet .  No crepitus or effusions noted.  Hammer toes  B/L. ? ?Skin  normotropic skin with no porokeratosis noted bilaterally.  No signs of infections or ulcers noted.   Distal clavi 3rd toe left foot.    ? ?Onychomycosis  Pain in right toes  Pain in left toes ? ?Consent was obtained for treatment procedures.   Mechanical debridement of nails 1-5  bilaterally performed with a nail nipper.  Filed with dremel without incident. Debridement of clavi with # 15 blade.  Padding dispensed. ? ? ?Return office visit   9 weeks                  Told patient to return for periodic foot care and evaluation due to potential at risk complications. ? ? ?Gardiner Barefoot DPM   ?

## 2021-08-03 ENCOUNTER — Telehealth: Payer: Self-pay | Admitting: Family Medicine

## 2021-08-03 NOTE — Telephone Encounter (Signed)
Medicare/BCBS auth: [employer] via [employer] ref # [facility], they will reach out to the patient to schedule.  ?

## 2021-08-16 ENCOUNTER — Other Ambulatory Visit: Payer: Self-pay

## 2021-08-16 DIAGNOSIS — Z95828 Presence of other vascular implants and grafts: Secondary | ICD-10-CM

## 2021-08-17 ENCOUNTER — Ambulatory Visit
Admission: RE | Admit: 2021-08-17 | Discharge: 2021-08-17 | Disposition: A | Discharge status: Home / Self Care | Payer: Medicare Other | Source: Ambulatory Visit | Attending: Family Medicine | Admitting: Family Medicine

## 2021-08-17 DIAGNOSIS — I6330 Cerebral infarction due to thrombosis of unspecified cerebral artery: Secondary | ICD-10-CM

## 2021-08-17 DIAGNOSIS — Z95828 Presence of other vascular implants and grafts: Secondary | ICD-10-CM

## 2021-08-17 MED ORDER — GADOBENATE DIMEGLUMINE 529 MG/ML IV SOLN
9.0000 mL | Freq: Once | INTRAVENOUS | Status: AC | PRN
  Administered 2021-08-17: 9 mL via INTRAVENOUS

## 2021-08-17 MED ORDER — SODIUM CHLORIDE 0.9% FLUSH
10.0000 mL | INTRAVENOUS | Status: DC | PRN
  Administered 2021-08-17: 10 mL via INTRAVENOUS

## 2021-08-17 MED ORDER — HEPARIN SOD (PORK) LOCK FLUSH 100 UNIT/ML IV SOLN
500.0000 [IU] | Freq: Once | INTRAVENOUS | Status: AC
  Administered 2021-08-17: 500 [IU] via INTRAVENOUS

## 2021-08-22 ENCOUNTER — Telehealth: Payer: Self-pay | Admitting: *Deleted

## 2021-08-22 NOTE — Telephone Encounter (Signed)
Called and spoke w/ daughter, [name]. Relayed results per Dr. Jabier Mutton note. (Aware Amy out this week). She verbalized understanding. However, she had further questions: ?Wondering if 69mm aneurysm is still present? Was told this was present at MRI 04/2021 ?Wondering if MRI showed signs of Dementia.I did let daughter know MRI does not definitively diagnose Dementia. If more than normal atrophy is seen on MRI, this can aid in dx of Dementia.  She wants to know if Dr. April Manson sees signs of this? ?Aware I will send back for MD review and then call back. ?

## 2021-08-22 NOTE — Progress Notes (Signed)
Please call and advise the patient that the recent  MRI Brain is stable from her previous one in December. There are no acute findings.  ? ?Please remind patient to keep any upcoming appointments or tests and to call us with any interim questions, concerns, problems or updates.  ? ?- Confluent areas of T2 hyperintensity with mild gyral swelling in the right parietal cortical and right mesial temporal regions, stable from 05/16/21. No abnormal lesions are seen on post contrast views.  Considerations include chronic infarctions or multi-focal low grade gliomas. ?- Moderate periventricular and subcortical and pontine foci of chronic small vessel ischemic disease. ?- No acute findings. No abnormal enhancement. No significant new findings from 05/16/21. ? ? ? ? ?Thanks,  ? ?Alric Ran, MD ? ?

## 2021-08-22 NOTE — Telephone Encounter (Signed)
-----   Message from Alric Ran, MD sent at 08/22/2021  9:25 AM EDT ----- ?Please call and advise the patient that the recent  MRI Brain is stable from her previous one in December. There are no acute findings.  ? ?Please remind patient to keep any upcoming appointments or tests and to call us with any interim questions, concerns, problems or updates.  ? ?- Confluent areas of T2 hyperintensity with mild gyral swelling in the right parietal cortical and right mesial temporal regions, stable from 05/16/21. No abnormal lesions are seen on post contrast views.  Considerations include chronic infarctions o ?r multi-focal low grade gliomas. ?- Moderate periventricular and subcortical and pontine foci of chronic small vessel ischemic disease. ?- No acute findings. No abnormal enhancement. No significant new findings from 05/16/21. ? ? ? ? ?Thanks,  ? ?Alric Ran, MD ?  ?  ?

## 2021-08-22 NOTE — Telephone Encounter (Signed)
Left a message for [name] explaining her that patient had a CT angiogram back in December that showed the aneurysm and the MRI will not show the aneurysm because it is not the proper study to look for aneurysm.   ? ?I have told her that MRI shows evidence of cortical atrophy which can be seen in patient with Dementia. I have asker her to reach back to [place] if she has additional questions.  ? ?Dr. April Manson

## 2021-09-06 ENCOUNTER — Telehealth: Payer: Self-pay | Admitting: Family Medicine

## 2021-09-06 NOTE — Telephone Encounter (Signed)
I called [name], daughter, to follow up regarding MRI results. She reports questions were answered with Dr April Manson. We reviewed option for neuro oncology consult. [name] will discuss with her mother's oncologist, Dr Irene Limbo, and let me know if she wishes to have referral placed. She will follow up with me in 11/2021.  ?

## 2021-09-08 ENCOUNTER — Telehealth: Payer: Self-pay | Admitting: Hematology

## 2021-09-08 NOTE — Telephone Encounter (Signed)
Per provider reschedule called and spoke to pt daughter.  Pt daughter confirmed appointment  ?

## 2021-09-30 ENCOUNTER — Inpatient Hospital Stay (HOSPITAL_BASED_OUTPATIENT_CLINIC_OR_DEPARTMENT_OTHER)
Admission: EM | Admit: 2021-09-30 | Discharge: 2021-10-03 | DRG: 310 | Disposition: A | Discharge status: Home / Self Care | Payer: Medicare Other | Attending: Family Medicine | Admitting: Family Medicine

## 2021-09-30 ENCOUNTER — Other Ambulatory Visit: Payer: Self-pay

## 2021-09-30 ENCOUNTER — Encounter (HOSPITAL_BASED_OUTPATIENT_CLINIC_OR_DEPARTMENT_OTHER): Payer: Self-pay

## 2021-09-30 ENCOUNTER — Emergency Department (HOSPITAL_BASED_OUTPATIENT_CLINIC_OR_DEPARTMENT_OTHER): Payer: Medicare Other

## 2021-09-30 DIAGNOSIS — Z9842 Cataract extraction status, left eye: Secondary | ICD-10-CM | POA: Diagnosis not present

## 2021-09-30 DIAGNOSIS — Z66 Do not resuscitate: Secondary | ICD-10-CM | POA: Diagnosis not present

## 2021-09-30 DIAGNOSIS — R4182 Altered mental status, unspecified: Secondary | ICD-10-CM | POA: Diagnosis not present

## 2021-09-30 DIAGNOSIS — Z808 Family history of malignant neoplasm of other organs or systems: Secondary | ICD-10-CM

## 2021-09-30 DIAGNOSIS — G319 Degenerative disease of nervous system, unspecified: Secondary | ICD-10-CM | POA: Diagnosis not present

## 2021-09-30 DIAGNOSIS — Z91040 Latex allergy status: Secondary | ICD-10-CM

## 2021-09-30 DIAGNOSIS — Z86718 Personal history of other venous thrombosis and embolism: Secondary | ICD-10-CM | POA: Diagnosis not present

## 2021-09-30 DIAGNOSIS — J9811 Atelectasis: Secondary | ICD-10-CM | POA: Diagnosis not present

## 2021-09-30 DIAGNOSIS — Z79899 Other long term (current) drug therapy: Secondary | ICD-10-CM

## 2021-09-30 DIAGNOSIS — I10 Essential (primary) hypertension: Secondary | ICD-10-CM | POA: Diagnosis present

## 2021-09-30 DIAGNOSIS — Z8249 Family history of ischemic heart disease and other diseases of the circulatory system: Secondary | ICD-10-CM

## 2021-09-30 DIAGNOSIS — F09 Unspecified mental disorder due to known physiological condition: Secondary | ICD-10-CM | POA: Diagnosis not present

## 2021-09-30 DIAGNOSIS — I442 Atrioventricular block, complete: Secondary | ICD-10-CM | POA: Diagnosis not present

## 2021-09-30 DIAGNOSIS — E876 Hypokalemia: Secondary | ICD-10-CM | POA: Diagnosis not present

## 2021-09-30 DIAGNOSIS — Z79890 Hormone replacement therapy (postmenopausal): Secondary | ICD-10-CM

## 2021-09-30 DIAGNOSIS — I459 Conduction disorder, unspecified: Secondary | ICD-10-CM | POA: Diagnosis present

## 2021-09-30 DIAGNOSIS — Z885 Allergy status to narcotic agent status: Secondary | ICD-10-CM

## 2021-09-30 DIAGNOSIS — Z7901 Long term (current) use of anticoagulants: Secondary | ICD-10-CM

## 2021-09-30 DIAGNOSIS — D72810 Lymphocytopenia: Secondary | ICD-10-CM | POA: Diagnosis not present

## 2021-09-30 DIAGNOSIS — R531 Weakness: Secondary | ICD-10-CM | POA: Diagnosis not present

## 2021-09-30 DIAGNOSIS — R001 Bradycardia, unspecified: Secondary | ICD-10-CM | POA: Diagnosis not present

## 2021-09-30 DIAGNOSIS — Z888 Allergy status to other drugs, medicaments and biological substances status: Secondary | ICD-10-CM

## 2021-09-30 DIAGNOSIS — Z9841 Cataract extraction status, right eye: Secondary | ICD-10-CM

## 2021-09-30 DIAGNOSIS — Z8572 Personal history of non-Hodgkin lymphomas: Secondary | ICD-10-CM

## 2021-09-30 DIAGNOSIS — F419 Anxiety disorder, unspecified: Secondary | ICD-10-CM | POA: Diagnosis present

## 2021-09-30 DIAGNOSIS — Z86711 Personal history of pulmonary embolism: Secondary | ICD-10-CM | POA: Diagnosis not present

## 2021-09-30 DIAGNOSIS — D696 Thrombocytopenia, unspecified: Secondary | ICD-10-CM | POA: Diagnosis present

## 2021-09-30 DIAGNOSIS — Z880 Allergy status to penicillin: Secondary | ICD-10-CM

## 2021-09-30 DIAGNOSIS — Z923 Personal history of irradiation: Secondary | ICD-10-CM

## 2021-09-30 DIAGNOSIS — E039 Hypothyroidism, unspecified: Secondary | ICD-10-CM | POA: Diagnosis not present

## 2021-09-30 DIAGNOSIS — E785 Hyperlipidemia, unspecified: Secondary | ICD-10-CM | POA: Diagnosis present

## 2021-09-30 DIAGNOSIS — Z853 Personal history of malignant neoplasm of breast: Secondary | ICD-10-CM | POA: Diagnosis not present

## 2021-09-30 DIAGNOSIS — Z96653 Presence of artificial knee joint, bilateral: Secondary | ICD-10-CM | POA: Diagnosis present

## 2021-09-30 DIAGNOSIS — I441 Atrioventricular block, second degree: Secondary | ICD-10-CM | POA: Diagnosis not present

## 2021-09-30 LAB — COMPREHENSIVE METABOLIC PANEL
ALT: 34 U/L (ref 0–44)
AST: 26 U/L (ref 15–41)
Albumin: 4.2 g/dL (ref 3.5–5.0)
Alkaline Phosphatase: 60 U/L (ref 38–126)
Anion gap: 10 (ref 5–15)
BUN: 27 mg/dL — ABNORMAL HIGH (ref 8–23)
CO2: 28 mmol/L (ref 22–32)
Calcium: 9.5 mg/dL (ref 8.9–10.3)
Chloride: 106 mmol/L (ref 98–111)
Creatinine, Ser: 0.63 mg/dL (ref 0.44–1.00)
GFR, Estimated: >60 mL/min (ref >60)
Glucose, Bld: 88 mg/dL (ref 70–99)
Potassium: 3.4 mmol/L — ABNORMAL LOW (ref 3.5–5.1)
Sodium: 144 mmol/L (ref 135–145)
Total Bilirubin: 0.7 mg/dL (ref 0.3–1.2)
Total Protein: 5.9 g/dL — ABNORMAL LOW (ref 6.5–8.1)

## 2021-09-30 LAB — CBC WITH DIFFERENTIAL/PLATELET
Abs Immature Granulocytes: 0.02 10*3/uL (ref 0.00–0.07)
Basophils Absolute: 0 10*3/uL (ref 0.0–0.1)
Basophils Relative: 1 %
Eosinophils Absolute: 0.1 10*3/uL (ref 0.0–0.5)
Eosinophils Relative: 4 %
HCT: 39.1 % (ref 36.0–46.0)
Hemoglobin: 12.5 g/dL (ref 12.0–15.0)
Immature Granulocytes: 1 %
Lymphocytes Relative: 17 %
Lymphs Abs: 0.6 10*3/uL — ABNORMAL LOW (ref 0.7–4.0)
MCH: 29.7 pg (ref 26.0–34.0)
MCHC: 32 g/dL (ref 30.0–36.0)
MCV: 92.9 fL (ref 80.0–100.0)
Monocytes Absolute: 0.4 10*3/uL (ref 0.1–1.0)
Monocytes Relative: 11 %
Neutro Abs: 2.6 10*3/uL (ref 1.7–7.7)
Neutrophils Relative %: 66 %
Platelets: 104 10*3/uL — ABNORMAL LOW (ref 150–400)
RBC: 4.21 MIL/uL (ref 3.87–5.11)
RDW: 14.4 % (ref 11.5–15.5)
Smear Review: DECREASED
WBC Morphology: ABNORMAL
WBC: 3.9 10*3/uL — ABNORMAL LOW (ref 4.0–10.5)
nRBC: 0 % (ref 0.0–0.2)

## 2021-09-30 LAB — URINALYSIS, ROUTINE W REFLEX MICROSCOPIC
Bilirubin Urine: NEGATIVE
Glucose, UA: NEGATIVE mg/dL
Hgb urine dipstick: NEGATIVE
Ketones, ur: NEGATIVE mg/dL
Leukocytes,Ua: NEGATIVE
Nitrite: NEGATIVE
Protein, ur: NEGATIVE mg/dL
Specific Gravity, Urine: 1.015 (ref 1.005–1.030)
pH: 5.5 (ref 5.0–8.0)

## 2021-09-30 NOTE — ED Notes (Signed)
Pt awake and alert sitting up in bed -- cleared to eat by provider; has been provided meat loaf meal and applesauce; continuous cardiac and pulse ox maintained.  Son at bedside.  No other needs verbalized at this time.   ?

## 2021-09-30 NOTE — ED Triage Notes (Signed)
Pt BIB by son. Son states he wants pt checked for UTI. He does not know what symptoms pt is having. Son does report pt has seemed more confused lately. Pt is A/Ox3 during triage.  ?

## 2021-09-30 NOTE — ED Notes (Signed)
Pt awake and alert - no acute distress noted.  RR even and unlabored on RA with symmetrical rise and fall of chest.  Continuous cardiac and pulse ox maintained-  2nd degree HB type I on monitor and with EKG - provider aware.  Pt denies any recent injury/fall - denies appetite changes.  Denies pain with urination and denies urinary pattern changes.    ?

## 2021-09-30 NOTE — ED Provider Notes (Signed)
?[facility] ?Provider Note ? ? ?CSN: [id number] ?Arrival date & time: 09/30/21  1619 ? ?  ? ?History ? ?Chief Complaint  ?Patient presents with  ? Weakness  ? ? ?Sarah [name] is a 88 y.o. female. ? ? ?Weakness ?Patient presents with generalized weakness and more confusion.  Presents with son.  Reportedly has been more confused.  Reportedly sent in to rule out UTI.  Denies chest pain.  Denies lightheadedness or dizziness.  Denies dysuria.  Has a history of hypertension including severe hypertension in the past. ?  ?Past Medical History:  ?Diagnosis Date  ? Allergy   ? codeine, thiazides  ? Anxiety   ? new dx  ? Arthritis   ? Breast cancer ([place]) 03/14/12  ? bx=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,ER/PR=+,upper inner quad  ? Cancer [facility])   ? breast  ? Cataract   ? DVT (deep venous thrombosis) ([place]) 02/2019  ? left leg  ? Dyspnea   ? Edema   ? both legs feet and toe, abdomen  ? Glaucoma   ? laser treated years ago  ? HOH (hard of hearing)   ? Hyperlipemia   ? Hypertension   ? Hypothyroidism   ? Pancreatic cyst   ? benign  ? PONV (postoperative nausea and vomiting)   ? Pulmonary embolism ([place]) 02/2019  ? bilateral   ? Radiation 06/11/2012-07/12/2012  ? 17 sessions 4250 cGy, 3 sessions 750 cGy  ? Vertigo   ? Wears glasses   ? Wears partial dentures   ? partial upper  ? ?Past Surgical History:  ?Procedure Laterality Date  ? ABDOMINAL HYSTERECTOMY  1966  ? 1/2 ovary left in   ? BREAST SURGERY  1992  ? lumpectomy-lt  ? CATARACT EXTRACTION    ? b/l  ? COLONOSCOPY    ? EXCISION MASS NECK Left 03/17/2019  ?  EXCISION MASS NECK (Left Neck)  ? EXCISION MASS NECK Left 03/17/2019  ? Procedure: EXCISION MASS NECK;  Surgeon: Helayne Seminole, MD;  Location: [place];  Service: ENT;  Laterality: Left;  ? EYE SURGERY Bilateral   ? bilateral cataract removal  ? IR IMAGING GUIDED PORT INSERTION  04/23/2019  ? JOINT REPLACEMENT  2013  ? rt total knee  ? JOINT REPLACEMENT  1995  ? lt total  knee  ? PARTIAL MASTECTOMY WITH NEEDLE LOCALIZATION  04/09/2012  ? Procedure: PARTIAL MASTECTOMY WITH NEEDLE LOCALIZATION;  Surgeon: Adin Hector, MD;  Location: [place];  Service: General;  Laterality: Right;  ? SKIN BIOPSY Left 03/17/2019  ? LEFT THIGH  ? SKIN BIOPSY Left 03/17/2019  ? Procedure: Skin Biopsy Left Thigh;  Surgeon: Helayne Seminole, MD;  Location: [place];  Service: ENT;  Laterality: Left;  ? TONSILLECTOMY    ? TOTAL KNEE ARTHROPLASTY  08/16/2011  ? Procedure: TOTAL KNEE ARTHROPLASTY;  Surgeon: Ninetta Lights, MD;  Location: [place];  Service: Orthopedics;  Laterality: Right;  ? ? ? ?Home Medications ?Prior to Admission medications   ?Medication Sig Start Date End Date Taking? Authorizing Provider  ?acetaminophen (TYLENOL) 500 MG tablet Take 1,000 mg by mouth every 6 (six) hours as needed for moderate pain.    [provider]  ?atorvastatin (LIPITOR) 20 MG tablet Take 1 tablet (20 mg total) by mouth daily. 05/18/21   Ghimire, Henreitta Leber, MD  ?B Complex-C (SUPER B COMPLEX PO) Take 1 tablet by mouth daily.    [provider]  ?carvedilol (COREG) 25  MG tablet Take 25 mg by mouth 2 (two) times daily. 06/26/21   [provider]  ?Cholecalciferol (VITAMIN D) 50 MCG (2000 UT) tablet Take 2,000 Units by mouth 2 (two) times daily.    [provider]  ?citalopram (CELEXA) 10 MG tablet Take 10 mg by mouth daily. 02/02/21   [provider]  ?ELIQUIS 2.5 MG TABS tablet TAKE 1 TABLET TWICE A DAY ?Patient taking differently: Take 2.5 mg by mouth 2 (two) times daily. 03/28/21   Brunetta Genera, MD  ?furosemide (LASIX) 20 MG tablet Take 20 mg by mouth. M W F = 2 tabs daily, T TH S S 1 tab daily    [provider]  ?hydrALAZINE (APRESOLINE) 50 MG tablet Take 1 tablet (50 mg total) by mouth 3 (three) times daily. 05/17/21 07/06/21  Jonetta Osgood, MD  ?levothyroxine (SYNTHROID) 25 MCG tablet Take 25 mcg by mouth daily before breakfast.   09/20/18   [provider]  ?lidocaine-prilocaine (EMLA) cream Apply to affected area once ?Patient taking differently: Apply 1 application. topically See admin instructions. Apply to port site prior to port access 04/13/20   Brunetta Genera, MD  ?Multiple Vitamins-Minerals (PRESERVISION AREDS PO) Take 1 capsule by mouth 2 (two) times daily.     [provider]  ?ondansetron (ZOFRAN) 8 MG tablet Take 1 tablet (8 mg total) by mouth 2 (two) times daily as needed. Start on the third day after chemotherapy. 04/14/19   Brunetta Genera, MD  ?Polyethyl Glycol-Propyl Glycol (SYSTANE) 0.4-0.3 % GEL ophthalmic gel Place 1 application into both eyes daily as needed (dry eyes).    [provider]  ?sodium chloride (MURO 128) 5 % ophthalmic ointment Place 1 application into both eyes at bedtime.    [provider]  ?Shirley Friar 5 % SOLN Place 1 drop into both eyes daily as needed (dry eyes). 10/22/19   [provider]  ?zolpidem (AMBIEN) 5 MG tablet Take 5 mg by mouth at bedtime. 12/07/19   [provider]  ?   ? ?Allergies    ?Alendronate sodium, Amoxicillin-pot clavulanate, Codeine, Latex, Lisinopril, Septra [sulfamethoxazole-trimethoprim], and Thiazide-type diuretics   ? ?Review of Systems   ?Review of Systems  ?Neurological:  Positive for weakness.  ?Psychiatric/Behavioral:  Positive for confusion.   ? ?Physical Exam ?Updated Vital Signs ?BP (!) 181/81 (BP Location: Right Arm)   Pulse 95   Temp 98.7 ?F (37.1 ?C) (Temporal)   Resp 12   Ht 5\' 4"  (1.626 m)   Wt 46.7 kg   SpO2 98%   BMI 17.68 kg/m?  ?Physical Exam ?Vitals and nursing note reviewed.  ?Eyes:  ?   Pupils: Pupils are equal, round, and reactive to light.  ?Cardiovascular:  ?   Rate and Rhythm: Bradycardia present.  ?Pulmonary:  ?   Breath sounds: Normal breath sounds.  ?Abdominal:  ?   Tenderness: There is no abdominal tenderness.  ?Neurological:  ?   Mental Status: She is alert and oriented to person,  place, and time.  ? ? ?ED Results / Procedures / Treatments   ?Labs ?(all labs ordered are listed, but only abnormal results are displayed) ?Labs Reviewed  ?COMPREHENSIVE METABOLIC PANEL - Abnormal; Notable for the following components:  ?    Result Value  ? Potassium 3.4 (*)   ? BUN 27 (*)   ? Total Protein 5.9 (*)   ? All other components within normal limits  ?CBC WITH DIFFERENTIAL/PLATELET - Abnormal; Notable for the  following components:  ? WBC 3.9 (*)   ? Platelets 104 (*)   ? Lymphs Abs 0.6 (*)   ? All other components within normal limits  ?URINALYSIS, ROUTINE W REFLEX MICROSCOPIC  ? ? ?EKG ?None ? ?Radiology ?CT Head Wo Contrast ? ?Result Date: 09/30/2021 ?CLINICAL DATA:  Altered mental status. EXAM: CT HEAD WITHOUT CONTRAST TECHNIQUE: Contiguous axial images were obtained from the base of the skull through the vertex without intravenous contrast. RADIATION DOSE REDUCTION: This exam was performed according to the departmental dose-optimization program which includes automated exposure control, adjustment of the mA and/or kV according to patient size and/or use of iterative reconstruction technique. COMPARISON:  May 14, 2021 FINDINGS: Brain: There is mild cerebral atrophy with widening of the extra-axial spaces and ventricular dilatation. There are areas of decreased attenuation within the white matter tracts of the supratentorial brain, consistent with microvascular disease changes. Vascular: No hyperdense vessel or unexpected calcification. Skull: Normal. Negative for fracture or focal lesion. Sinuses/Orbits: No acute finding. Other: None. IMPRESSION: 1. No acute intracranial abnormality. 2. Generalized cerebral atrophy with chronic white matter small vessel ischemic changes. Electronically Signed   By: Virgina Norfolk M.D.   On: 09/30/2021 20:02   ? ?Procedures ?Procedures  ? ? ?Medications Ordered in ED ?Medications - No data to display ? ?ED Course/ Medical Decision Making/ A&P ?  ?                         ?Medical Decision Making ?Amount and/or Complexity of Data Reviewed ?Labs: ordered. ?Radiology: ordered. ? ? ?Patient presented with confusion.  Reportedly is been more confused recently and family thought

## 2021-09-30 NOTE — ED Notes (Signed)
Patient transported to CT via stretcher;  pt awake and alert  ?

## 2021-10-01 ENCOUNTER — Observation Stay (HOSPITAL_COMMUNITY): Payer: Medicare Other

## 2021-10-01 DIAGNOSIS — D696 Thrombocytopenia, unspecified: Secondary | ICD-10-CM

## 2021-10-01 DIAGNOSIS — R531 Weakness: Secondary | ICD-10-CM | POA: Diagnosis not present

## 2021-10-01 DIAGNOSIS — R001 Bradycardia, unspecified: Secondary | ICD-10-CM | POA: Diagnosis not present

## 2021-10-01 DIAGNOSIS — E876 Hypokalemia: Secondary | ICD-10-CM

## 2021-10-01 DIAGNOSIS — I441 Atrioventricular block, second degree: Secondary | ICD-10-CM | POA: Diagnosis not present

## 2021-10-01 DIAGNOSIS — Z86711 Personal history of pulmonary embolism: Secondary | ICD-10-CM

## 2021-10-01 DIAGNOSIS — J9811 Atelectasis: Secondary | ICD-10-CM | POA: Diagnosis not present

## 2021-10-01 LAB — AMMONIA: Ammonia: 19 umol/L (ref 9–35)

## 2021-10-01 LAB — MAGNESIUM: Magnesium: 2.2 mg/dL (ref 1.7–2.4)

## 2021-10-01 MED ORDER — CITALOPRAM HYDROBROMIDE 10 MG PO TABS
10.0000 mg | ORAL_TABLET | Freq: Every day | ORAL | Status: DC
  Administered 2021-10-02 – 2021-10-03 (×2): 10 mg via ORAL
  Filled 2021-10-01 (×2): qty 1

## 2021-10-01 MED ORDER — POTASSIUM CHLORIDE CRYS ER 20 MEQ PO TBCR
20.0000 meq | EXTENDED_RELEASE_TABLET | Freq: Once | ORAL | Status: AC
  Administered 2021-10-01: 20 meq via ORAL
  Filled 2021-10-01: qty 1

## 2021-10-01 MED ORDER — CHLORHEXIDINE GLUCONATE CLOTH 2 % EX PADS
6.0000 | MEDICATED_PAD | Freq: Every day | CUTANEOUS | Status: DC
  Administered 2021-10-01 – 2021-10-03 (×3): 6 via TOPICAL

## 2021-10-01 MED ORDER — SODIUM CHLORIDE 0.9% FLUSH
3.0000 mL | INTRAVENOUS | Status: DC | PRN
  Administered 2021-10-03: 10 mL via INTRAVENOUS

## 2021-10-01 MED ORDER — ACETAMINOPHEN 650 MG RE SUPP
650.0000 mg | Freq: Four times a day (QID) | RECTAL | Status: DC | PRN
Start: 2021-10-01 — End: 2021-10-03

## 2021-10-01 MED ORDER — SODIUM CHLORIDE 0.9% FLUSH
3.0000 mL | Freq: Two times a day (BID) | INTRAVENOUS | Status: DC
  Administered 2021-10-01 – 2021-10-03 (×3): 3 mL via INTRAVENOUS

## 2021-10-01 MED ORDER — FUROSEMIDE 20 MG PO TABS
20.0000 mg | ORAL_TABLET | Freq: Every day | ORAL | Status: DC
  Administered 2021-10-01 – 2021-10-03 (×3): 20 mg via ORAL
  Filled 2021-10-01 (×3): qty 1

## 2021-10-01 MED ORDER — ACETAMINOPHEN 325 MG PO TABS
650.0000 mg | ORAL_TABLET | Freq: Four times a day (QID) | ORAL | Status: DC | PRN
  Administered 2021-10-02: 650 mg via ORAL
  Filled 2021-10-01: qty 2

## 2021-10-01 MED ORDER — APIXABAN 2.5 MG PO TABS
2.5000 mg | ORAL_TABLET | Freq: Two times a day (BID) | ORAL | Status: DC
  Administered 2021-10-01 – 2021-10-03 (×5): 2.5 mg via ORAL
  Filled 2021-10-01 (×5): qty 1

## 2021-10-01 MED ORDER — POLYVINYL ALCOHOL 1.4 % OP SOLN
1.0000 [drp] | Freq: Every day | OPHTHALMIC | Status: DC | PRN
  Filled 2021-10-01: qty 15

## 2021-10-01 MED ORDER — HYDRALAZINE HCL 25 MG PO TABS
50.0000 mg | ORAL_TABLET | Freq: Once | ORAL | Status: AC
  Administered 2021-10-01: 50 mg via ORAL
  Filled 2021-10-01: qty 2

## 2021-10-01 MED ORDER — HYDRALAZINE HCL 50 MG PO TABS
50.0000 mg | ORAL_TABLET | Freq: Three times a day (TID) | ORAL | Status: DC
  Administered 2021-10-01: 50 mg via ORAL
  Filled 2021-10-01: qty 1

## 2021-10-01 MED ORDER — ZOLPIDEM TARTRATE 5 MG PO TABS
5.0000 mg | ORAL_TABLET | Freq: Every evening | ORAL | Status: DC | PRN
  Filled 2021-10-01: qty 1

## 2021-10-01 MED ORDER — ATORVASTATIN CALCIUM 10 MG PO TABS
20.0000 mg | ORAL_TABLET | Freq: Every day | ORAL | Status: DC
  Administered 2021-10-01 – 2021-10-03 (×3): 20 mg via ORAL
  Filled 2021-10-01 (×3): qty 2

## 2021-10-01 MED ORDER — ZOLPIDEM TARTRATE 5 MG PO TABS
5.0000 mg | ORAL_TABLET | Freq: Every day | ORAL | Status: DC
  Administered 2021-10-01 – 2021-10-02 (×2): 5 mg via ORAL
  Filled 2021-10-01 (×2): qty 1

## 2021-10-01 MED ORDER — LEVOTHYROXINE SODIUM 25 MCG PO TABS
25.0000 ug | ORAL_TABLET | Freq: Every day | ORAL | Status: DC
  Administered 2021-10-02 – 2021-10-03 (×2): 25 ug via ORAL
  Filled 2021-10-01 (×2): qty 1

## 2021-10-01 MED ORDER — SODIUM CHLORIDE 0.9 % IV SOLN: 250.0000 mL | INTRAVENOUS | Status: DC | PRN

## 2021-10-01 NOTE — ED Notes (Signed)
Pt.s O2 sat was 85% on room air. Placed her on 2L O2 via [place].  O2 sat increased to 98% while sleeping. ?

## 2021-10-01 NOTE — Assessment & Plan Note (Signed)
Stable, continue to monitor  ?

## 2021-10-01 NOTE — Assessment & Plan Note (Addendum)
TSH wnl in 04/2021, repeat pending  ?Continue home synthroid  ?

## 2021-10-01 NOTE — Assessment & Plan Note (Signed)
Continue lipitor daily  ?

## 2021-10-01 NOTE — Plan of Care (Signed)
[facility] will assume care on arrival to accepting facility. Until arrival, care as per EDP. However, [facility] available 24/7 for questions and assistance.  Nursing staff, please page [facility] Admits and Consults ([phone]) as soon as the patient arrives to the hospital.   
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
## 2021-10-01 NOTE — ED Notes (Addendum)
ED attending, Dr. Stark Jock, notified via secure chat of non-sustained HR 38 intermittently -- remains on continuous cardiac monitor -- 2nd AV Block Type I.  Pt denies dizziness, sob, cp.  ?

## 2021-10-01 NOTE — ED Notes (Addendum)
Zoll Defib Pads now in place due to HR 29 non-sustained while sleeping  -- Zoll pak set for monitoring only at this time.  Pt resting in bed at this time; easily aroused with verbal stimulation; RR even and unlabored on RA with symmetrical rise and fall of chest.  Daughter at bedside.  ?

## 2021-10-01 NOTE — ED Notes (Signed)
Pt back in bed resting with eyes closed; RR even and unlabored on RA - continuous cardiac and pulse ox maintained.  Remains in 2nd HB type I HR 46 ?

## 2021-10-01 NOTE — ED Notes (Signed)
Pt oob to hall bathroom using personal rollater; escorted by daughter.  Pt and family updated on plan of care for admission; daughter expresses concerns that pt missed bp meds, Ambien and Eliquis tonite -- Dr Alvino Chapel made aware -- has re-ordered hydralazine; ambien held due to recent confusion -- awaiting decision on eliquis order  ?

## 2021-10-01 NOTE — ED Notes (Signed)
Ambien not available in [place] pyxis-- daughter will go home and retrieve patient meds ?

## 2021-10-01 NOTE — ED Provider Notes (Signed)
Patient is holding for bed.  Her heart rate is still in the 40s.  Her blood pressure is stable.  I am reordering some of her home medications.  I consulted with Dr.Pahwani.  She is okay with restarting the Eliquis.  We will hold off on antihypertensive medications and of course her Coreg. ?  ?Malvin Johns, MD ?10/01/21 3729 ? ?

## 2021-10-01 NOTE — Assessment & Plan Note (Signed)
Check magnesium ?Replete orally ?Repeat in AM ?

## 2021-10-01 NOTE — Assessment & Plan Note (Addendum)
88 year old female with history of intermittent episodes of confusion found to be 2nd degree heart block type 1 ?-obs to tele ?-telemetry now appears to show NSR that has intermittent block, average HR from home is 56-60, have ordered repeat ekg ?-cardiology consulted in edp and recommended obs overnight. Holding coreg/norvasc. Once beta blocker washes out she may come out of type 2 block. Has been off coreg x 24 hours and still with intermittent heart block, cardiology officially consulted.  ?- seems to be asymptomatic with the bradycardia  ? ?
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
## 2021-10-01 NOTE — Assessment & Plan Note (Signed)
Hypertensive, has not had her medication ?Holding coreg/norvasc with 2nd degree AV block ?Continue lasix/hydralazine ?Monitor  ?

## 2021-10-01 NOTE — Assessment & Plan Note (Addendum)
Had two episodes yesterday of confusion ?Back to baseline per daughter  ?CT head wnl  ?Will check metabolic labs ?CXR pending, UA clear, no other signs or symptoms of infection  ?

## 2021-10-01 NOTE — Assessment & Plan Note (Signed)
Likely secondary to malignancy ?Continue eliquis ?

## 2021-10-01 NOTE — ED Notes (Signed)
Pt daughter to the nurses station- inquiring again about Ambien; states pt has returned to baseline and only had 2 short episodes of confusion prior to arrival  -- notified daughter would be speak to attending on duty, Dr Stark Jock - will send secure chat  ?

## 2021-10-02 DIAGNOSIS — Z880 Allergy status to penicillin: Secondary | ICD-10-CM | POA: Diagnosis not present

## 2021-10-02 DIAGNOSIS — Z86711 Personal history of pulmonary embolism: Secondary | ICD-10-CM

## 2021-10-02 DIAGNOSIS — I459 Conduction disorder, unspecified: Secondary | ICD-10-CM | POA: Diagnosis present

## 2021-10-02 DIAGNOSIS — Z79899 Other long term (current) drug therapy: Secondary | ICD-10-CM | POA: Diagnosis not present

## 2021-10-02 DIAGNOSIS — Z8249 Family history of ischemic heart disease and other diseases of the circulatory system: Secondary | ICD-10-CM | POA: Diagnosis not present

## 2021-10-02 DIAGNOSIS — D696 Thrombocytopenia, unspecified: Secondary | ICD-10-CM

## 2021-10-02 DIAGNOSIS — Z86718 Personal history of other venous thrombosis and embolism: Secondary | ICD-10-CM | POA: Diagnosis not present

## 2021-10-02 DIAGNOSIS — E876 Hypokalemia: Secondary | ICD-10-CM

## 2021-10-02 DIAGNOSIS — G319 Degenerative disease of nervous system, unspecified: Secondary | ICD-10-CM | POA: Diagnosis present

## 2021-10-02 DIAGNOSIS — Z888 Allergy status to other drugs, medicaments and biological substances status: Secondary | ICD-10-CM | POA: Diagnosis not present

## 2021-10-02 DIAGNOSIS — F09 Unspecified mental disorder due to known physiological condition: Secondary | ICD-10-CM | POA: Diagnosis not present

## 2021-10-02 DIAGNOSIS — E039 Hypothyroidism, unspecified: Secondary | ICD-10-CM

## 2021-10-02 DIAGNOSIS — Z66 Do not resuscitate: Secondary | ICD-10-CM | POA: Diagnosis present

## 2021-10-02 DIAGNOSIS — I441 Atrioventricular block, second degree: Secondary | ICD-10-CM | POA: Diagnosis present

## 2021-10-02 DIAGNOSIS — Z885 Allergy status to narcotic agent status: Secondary | ICD-10-CM | POA: Diagnosis not present

## 2021-10-02 DIAGNOSIS — Z9841 Cataract extraction status, right eye: Secondary | ICD-10-CM | POA: Diagnosis not present

## 2021-10-02 DIAGNOSIS — I10 Essential (primary) hypertension: Secondary | ICD-10-CM

## 2021-10-02 DIAGNOSIS — D72810 Lymphocytopenia: Secondary | ICD-10-CM | POA: Diagnosis present

## 2021-10-02 DIAGNOSIS — F419 Anxiety disorder, unspecified: Secondary | ICD-10-CM | POA: Diagnosis present

## 2021-10-02 DIAGNOSIS — Z923 Personal history of irradiation: Secondary | ICD-10-CM | POA: Diagnosis not present

## 2021-10-02 DIAGNOSIS — Z9842 Cataract extraction status, left eye: Secondary | ICD-10-CM | POA: Diagnosis not present

## 2021-10-02 DIAGNOSIS — Z853 Personal history of malignant neoplasm of breast: Secondary | ICD-10-CM | POA: Diagnosis not present

## 2021-10-02 DIAGNOSIS — Z91040 Latex allergy status: Secondary | ICD-10-CM | POA: Diagnosis not present

## 2021-10-02 DIAGNOSIS — Z808 Family history of malignant neoplasm of other organs or systems: Secondary | ICD-10-CM | POA: Diagnosis not present

## 2021-10-02 DIAGNOSIS — Z8572 Personal history of non-Hodgkin lymphomas: Secondary | ICD-10-CM | POA: Diagnosis not present

## 2021-10-02 DIAGNOSIS — E785 Hyperlipidemia, unspecified: Secondary | ICD-10-CM | POA: Diagnosis present

## 2021-10-02 LAB — CBC
HCT: 37.7 % (ref 36.0–46.0)
Hemoglobin: 12.5 g/dL (ref 12.0–15.0)
MCH: 30.8 pg (ref 26.0–34.0)
MCHC: 33.2 g/dL (ref 30.0–36.0)
MCV: 92.9 fL (ref 80.0–100.0)
Platelets: 93 10*3/uL — ABNORMAL LOW (ref 150–400)
RBC: 4.06 MIL/uL (ref 3.87–5.11)
RDW: 14.2 % (ref 11.5–15.5)
WBC: 3.5 10*3/uL — ABNORMAL LOW (ref 4.0–10.5)
nRBC: 0 % (ref 0.0–0.2)

## 2021-10-02 LAB — BASIC METABOLIC PANEL
Anion gap: 6 (ref 5–15)
BUN: 16 mg/dL (ref 8–23)
CO2: 30 mmol/L (ref 22–32)
Calcium: 9.3 mg/dL (ref 8.9–10.3)
Chloride: 106 mmol/L (ref 98–111)
Creatinine, Ser: 0.66 mg/dL (ref 0.44–1.00)
GFR, Estimated: >60 mL/min (ref >60)
Glucose, Bld: 87 mg/dL (ref 70–99)
Potassium: 3.2 mmol/L — ABNORMAL LOW (ref 3.5–5.1)
Sodium: 142 mmol/L (ref 135–145)

## 2021-10-02 LAB — VITAMIN B12: Vitamin B-12: 810 pg/mL (ref 180–914)

## 2021-10-02 LAB — TSH: TSH: 1.917 u[IU]/mL (ref 0.350–4.500)

## 2021-10-02 MED ORDER — HYDRALAZINE HCL 50 MG PO TABS
100.0000 mg | ORAL_TABLET | Freq: Three times a day (TID) | ORAL | Status: DC
  Administered 2021-10-02 – 2021-10-03 (×4): 100 mg via ORAL
  Filled 2021-10-02 (×4): qty 2

## 2021-10-02 MED ORDER — POTASSIUM CHLORIDE CRYS ER 20 MEQ PO TBCR
40.0000 meq | EXTENDED_RELEASE_TABLET | Freq: Once | ORAL | Status: AC
  Administered 2021-10-02: 40 meq via ORAL
  Filled 2021-10-02: qty 2

## 2021-10-02 NOTE — Progress Notes (Signed)
? ?Progress Note ? ?Patient Name: Sarah [name] ?Date of Encounter: 10/02/2021 ? ?[facility] Cardiologist: Buford Dresser, MD  ? ?Subjective  ? ?No complaints ? ?Inpatient Medications  ?  ?Scheduled Meds: ? apixaban  2.5 mg Oral BID  ? atorvastatin  20 mg Oral Daily  ? Chlorhexidine Gluconate Cloth  6 each Topical Daily  ? citalopram  10 mg Oral Daily  ? furosemide  20 mg Oral Daily  ? hydrALAZINE  50 mg Oral TID  ? levothyroxine  25 mcg Oral QAC breakfast  ? sodium chloride flush  3 mL Intravenous Q12H  ? zolpidem  5 mg Oral QHS  ? ?Continuous Infusions: ? sodium chloride    ? ?PRN Meds: ?sodium chloride, acetaminophen **OR** acetaminophen, polyvinyl alcohol, sodium chloride flush  ? ?Vital Signs  ?  ?Vitals:  ? 10/01/21 1630 10/01/21 1734 10/01/21 2053 10/02/21 0602  ?BP: (!) 170/85 (!) 185/87 (!) 180/89 (!) 186/70  ?Pulse: (!) 40 (!) 51 (!) 56 (!) 51  ?Resp: 15 14 15 18   ?Temp:  98 ?F (36.7 ?C) 98.3 ?F (36.8 ?C) 97.6 ?F (36.4 ?C)  ?TempSrc:  Oral Oral Oral  ?SpO2: 98% 100% 96% 99%  ?Weight:      ?Height:      ? ? ?Intake/Output Summary (Last 24 hours) at 10/02/2021 0825 ?Last data filed at 10/01/2021 2117 ?Gross per 24 hour  ?Intake 240 ml  ?Output --  ?Net 240 ml  ? ? ?  09/30/2021  ?  4:32 PM 07/26/2021  ? 12:51 PM 07/06/2021  ?  2:31 PM  ?Last 3 Weights  ?Weight (lbs) 103 lb 111 lb 114 lb 8 oz  ?Weight (kg) 46.72 kg 50.349 kg 51.937 kg  ?   ? ?Telemetry  ?  ?SR, sinus brady, wenchebach block - Personally Reviewed ? ?ECG  ?  ?N/a - Personally Reviewed ? ?Physical Exam  ? ?GEN: No acute distress.   ?Neck: No JVD ?Cardiac: RRR, no murmurs, rubs, or gallops.  ?Respiratory: Clear to auscultation bilaterally. ?GI: Soft, nontender, non-distended  ?MS: No edema; No deformity. ?Neuro:  Nonfocal  ?Psych: Normal affect  ? ?Labs  ?  ?High Sensitivity Troponin:  No results for input(s): TROPONINIHS in the last 720 hours.   ?Chemistry ?Recent Labs  ?Lab 09/30/21 ?2054 10/01/21 ?2320 10/02/21 ?0500  ?NA 144  --  142   ?K 3.4*  --  3.2*  ?CL 106  --  106  ?CO2 28  --  30  ?GLUCOSE 88  --  87  ?BUN 27*  --  16  ?CREATININE 0.63  --  0.66  ?CALCIUM 9.5  --  9.3  ?MG  --  2.2  --   ?PROT 5.9*  --   --   ?ALBUMIN 4.2  --   --   ?AST 26  --   --   ?ALT 34  --   --   ?ALKPHOS 60  --   --   ?BILITOT 0.7  --   --   ?GFRNONAA >60  --  >60  ?ANIONGAP 10  --  6  ?  ?Lipids No results for input(s): CHOL, TRIG, HDL, LABVLDL, LDLCALC, CHOLHDL in the last 168 hours.  ?Hematology ?Recent Labs  ?Lab 09/30/21 ?2054 10/02/21 ?0500  ?WBC 3.9* 3.5*  ?RBC 4.21 4.06  ?HGB 12.5 12.5  ?HCT 39.1 37.7  ?MCV 92.9 92.9  ?MCH 29.7 30.8  ?MCHC 32.0 33.2  ?RDW 14.4 14.2  ?PLT 104* 93*  ? ?Thyroid  ?  Recent Labs  ?Lab 10/01/21 ?2320  ?TSH 1.917  ?  ?BNPNo results for input(s): BNP, PROBNP in the last 168 hours.  ?DDimer No results for input(s): DDIMER in the last 168 hours.  ? ?Radiology  ?  ?CT Head Wo Contrast ? ?Result Date: 09/30/2021 ?CLINICAL DATA:  Altered mental status. EXAM: CT HEAD WITHOUT CONTRAST TECHNIQUE: Contiguous axial images were obtained from the base of the skull through the vertex without intravenous contrast. RADIATION DOSE REDUCTION: This exam was performed according to the departmental dose-optimization program which includes automated exposure control, adjustment of the mA and/or kV according to patient size and/or use of iterative reconstruction technique. COMPARISON:  May 14, 2021 FINDINGS: Brain: There is mild cerebral atrophy with widening of the extra-axial spaces and ventricular dilatation. There are areas of decreased attenuation within the white matter tracts of the supratentorial brain, consistent with microvascular disease changes. Vascular: No hyperdense vessel or unexpected calcification. Skull: Normal. Negative for fracture or focal lesion. Sinuses/Orbits: No acute finding. Other: None. IMPRESSION: 1. No acute intracranial abnormality. 2. Generalized cerebral atrophy with chronic white matter small vessel ischemic  changes. Electronically Signed   By: Virgina Norfolk M.D.   On: 09/30/2021 20:02  ? ?DG CHEST PORT 1 VIEW ? ?Result Date: 10/01/2021 ?CLINICAL DATA:  93061 weakness EXAM: PORTABLE CHEST 1 VIEW COMPARISON:  Chest radiograph dated January 26, 2021 FINDINGS: LEFT basilar atelectasis.Atherosclerotic calcifications of the tortuous thoracic aorta. RIGHT chest port with tip terminating over the superior cavoatrial junction. Similar biapical scarring. No pleural effusion. No pneumothorax. LEFT basilar platelike opacity most consistent with atelectasis. Visualized abdomen is unremarkable. IMPRESSION: LEFT basilar atelectasis. Electronically Signed   By: Valentino Saxon M.D.   On: 10/01/2021 19:32   ? ?Cardiac Studies  ? ? ? ?Patient Profile  ?   ?37F with HTN, hx of PE on apixaban, hypothyroid, hx of breast cancer and lymphoma presents for confusion. Cardiology consulted for sinus bradycardia and Wenckebach.  ? ?Assessment & Plan  ?  ?1.Sinus bradycardia/[place] I Wenchebach block ?- TSH 1.9, K 3.4, Mg 2.2 ?- she was on coreg 25mg  bid at home. From review appears she was on coreg just for HTN, no clear secondary indication.  ?- family reports last coreg dose was Friday morning. Rates improving, still some wenchebach block, continue to monitor today.  ? ? ?2. History of PE on eliquis ? ? ?3. HTN ?- allergies to lisinopril, thiazides.  ?- has been on hydral, will increase to 100mg  tid for now ? ?Would monitor tele at least 1 more day ? ?For questions or updates, please contact [place] ?Please consult [email or web address] for contact info under  ? ?  ?   ?Signed, ?Carlyle Dolly, MD  ?10/02/2021, 8:25 AM   ? ?

## 2021-10-02 NOTE — Progress Notes (Signed)
?Progress Note ? ?Patient: ELLISHA [name] PJK:[id number] DOB: [birth year]  ?DOA: 09/30/2021  DOS: 10/02/2021  ?  ?Brief hospital course: ?Sarah [name] is a 88 y.o. female with a history of lymphoma, breast CA, PE, HTN, HLD, hypothyroidism who presented from [facility] due to mild confusion which resolved in the ED. She did admit to some lightheadedness over the past week. Work-up remarkable mostly for type 1 2nd degree AV block on ECG with 1st degree block and rates into 40bpm's also noted on monitor. Cardiology was consulted, recommending inpatient monitor of heart rate as coreg is held. Remains bradycardic and will monitor an additional day.  ? ?Assessment and Plan: ?Second degree heart block type 1: Persistent thus far.  ?- Continue monitoring telemetry off beta blocker.  ? ?HTN: BP elevated, improving with increased hydralazine to 153/80.  ?- Augment hydralazine.  ?- Continue lasix ?- Was on norvasc 2.5mg  which we will not continue given propensity to mildly slow HR. Note history of reaction to lisinopril and thiazides. ? ?Mild cognitive disorder: Chronic findings on CT here. Recent MRI confirmed cerebral atrophy as well, followed by Dr. April Manson (neurology).  ?- At baseline currently per daughter.   ? ?Hypokalemia:  ?- Repeat supplementation. ? ?Hyperlipemia:  ?- Continue statin ? ?History of pulmonary embolism: No current evidence of PE.  ?- Continue eliquis. ? ?Hypothyroidism: TSH 1.917.  ?- Continue home synthroid.  ? ?Thrombocytopenia: With giant platelets noted on smear. Chronic, no bleeding.  ?- Monitor in AM.  ? ?Lymphopenia, history T cell lymphoma: Abnormal morphology on smear.  ?- Follow up with Dr. Irene Limbo. ? ?Subjective: No complaints, thinking as clearly as she normally does per self and daughter at bedside. No chest pain, dyspnea, palpitations, leg swelling. ? ?Objective: ?Vitals:  ? 10/01/21 2053 10/02/21 0602 10/02/21 0953 10/02/21 1300  ?BP: (!) 180/89 (!) 186/70 (!) 174/84 (!) 153/80  ?Pulse: (!) 56 (!)  51 (!) 56 63  ?Resp: 15 18 18 18   ?Temp: 98.3 ?F (36.8 ?C) 97.6 ?F (36.4 ?C)  97.8 ?F (36.6 ?C)  ?TempSrc: Oral Oral  Oral  ?SpO2: 96% 99% 97% 98%  ?Weight:      ?Height:      ? ?Gen: Thin elderly female in no distress ?Pulm: Nonlabored breathing room air.  ?CV: Regular bradycardia without murmur, rub, or gallop. No JVD, no dependent edema. ?GI: Abdomen soft, non-tender, non-distended, with normoactive bowel sounds.  ?Ext: Warm, no deformities ?Skin: No rashes, lesions or ulcers on visualized skin. ?Neuro: Alert and oriented. No focal neurological deficits. ?Psych: Judgement and insight appear fair. Mood euthymic & affect congruent. Behavior is appropriate.   ? ?Data Personally reviewed: ?CBC: ?Recent Labs  ?Lab 09/30/21 ?2054 10/02/21 ?0500  ?WBC 3.9* 3.5*  ?NEUTROABS 2.6  --   ?HGB 12.5 12.5  ?HCT 39.1 37.7  ?MCV 92.9 92.9  ?PLT 104* 93*  ? ?Basic Metabolic Panel: ?Recent Labs  ?Lab 09/30/21 ?2054 10/01/21 ?2320 10/02/21 ?0500  ?NA 144  --  142  ?K 3.4*  --  3.2*  ?CL 106  --  106  ?CO2 28  --  30  ?GLUCOSE 88  --  87  ?BUN 27*  --  16  ?CREATININE 0.63  --  0.66  ?CALCIUM 9.5  --  9.3  ?MG  --  2.2  --   ? ?GFR: ?Estimated Creatinine Clearance: 37.2 mL/min (by C-G formula based on SCr of 0.66 mg/dL). ?Liver Function Tests: ?Recent Labs  ?Lab 09/30/21 ?2054  ?AST 26  ?  ALT 34  ?ALKPHOS 60  ?BILITOT 0.7  ?PROT 5.9*  ?ALBUMIN 4.2  ? ?No results for input(s): LIPASE, AMYLASE in the last 168 hours. ?Recent Labs  ?Lab 10/01/21 ?2320  ?AMMONIA 19  ? ?Coagulation Profile: ?No results for input(s): INR, PROTIME in the last 168 hours. ?Cardiac Enzymes: ?No results for input(s): CKTOTAL, CKMB, CKMBINDEX, TROPONINI in the last 168 hours. ?BNP (last 3 results) ?No results for input(s): PROBNP in the last 8760 hours. ?HbA1C: ?No results for input(s): HGBA1C in the last 72 hours. ?CBG: ?No results for input(s): GLUCAP in the last 168 hours. ?Lipid Profile: ?No results for input(s): CHOL, HDL, LDLCALC, TRIG, CHOLHDL, LDLDIRECT in  the last 72 hours. ?Thyroid Function Tests: ?Recent Labs  ?  10/01/21 ?2320  ?TSH 1.917  ? ?Anemia Panel: ?Recent Labs  ?  10/01/21 ?2320  ?VITAMINB12 810  ? ?Urine analysis: ?   ?Component Value Date/Time  ? [place] YELLOW 09/30/2021 1636  ? APPEARANCEUR CLEAR 09/30/2021 1636  ? LABSPEC 1.015 09/30/2021 1636  ? PHURINE 5.5 09/30/2021 1636  ? GLUCOSEU NEGATIVE 09/30/2021 1636  ? [place] NEGATIVE 09/30/2021 1636  ? [place] NEGATIVE 09/30/2021 1636  ? Benjamin Stain NEGATIVE 09/30/2021 1636  ? PROTEINUR NEGATIVE 09/30/2021 1636  ? UROBILINOGEN 0.2 04/09/2012 1202  ? NITRITE NEGATIVE 09/30/2021 1636  ? LEUKOCYTESUR NEGATIVE 09/30/2021 1636  ? ?No results found for this or any previous visit (from the past 240 hour(s)).   ?CT Head Wo Contrast ? ?Result Date: 09/30/2021 ?CLINICAL DATA:  Altered mental status. EXAM: CT HEAD WITHOUT CONTRAST TECHNIQUE: Contiguous axial images were obtained from the base of the skull through the vertex without intravenous contrast. RADIATION DOSE REDUCTION: This exam was performed according to the departmental dose-optimization program which includes automated exposure control, adjustment of the mA and/or kV according to patient size and/or use of iterative reconstruction technique. COMPARISON:  May 14, 2021 FINDINGS: Brain: There is mild cerebral atrophy with widening of the extra-axial spaces and ventricular dilatation. There are areas of decreased attenuation within the white matter tracts of the supratentorial brain, consistent with microvascular disease changes. Vascular: No hyperdense vessel or unexpected calcification. Skull: Normal. Negative for fracture or focal lesion. Sinuses/Orbits: No acute finding. Other: None. IMPRESSION: 1. No acute intracranial abnormality. 2. Generalized cerebral atrophy with chronic white matter small vessel ischemic changes. Electronically Signed   By: Virgina Norfolk M.D.   On: 09/30/2021 20:02  ? ?DG CHEST PORT 1 VIEW ? ?Result Date:  10/01/2021 ?CLINICAL DATA:  93061 weakness EXAM: PORTABLE CHEST 1 VIEW COMPARISON:  Chest radiograph dated January 26, 2021 FINDINGS: LEFT basilar atelectasis.Atherosclerotic calcifications of the tortuous thoracic aorta. RIGHT chest port with tip terminating over the superior cavoatrial junction. Similar biapical scarring. No pleural effusion. No pneumothorax. LEFT basilar platelike opacity most consistent with atelectasis. Visualized abdomen is unremarkable. IMPRESSION: LEFT basilar atelectasis. Electronically Signed   By: Valentino Saxon M.D.   On: 10/01/2021 19:32   ?  ?Family Communication: Daughter at bedside ? ?Disposition: ?Status is: Inpatient ?Remains inpatient appropriate because: Needs continued telemetry monitoring for persistent 2nd degree AV block. ?Planned Discharge Destination:  ILF ? ?Patrecia Pour, MD ?10/02/2021 4:05 PM ?Page by [email or web address]  ?

## 2021-10-03 ENCOUNTER — Inpatient Hospital Stay: Payer: Medicare Other

## 2021-10-03 ENCOUNTER — Other Ambulatory Visit (HOSPITAL_COMMUNITY): Payer: Self-pay

## 2021-10-03 ENCOUNTER — Inpatient Hospital Stay (HOSPITAL_BASED_OUTPATIENT_CLINIC_OR_DEPARTMENT_OTHER)
Admit: 2021-10-03 | Discharge: 2021-10-03 | Disposition: A | Discharge status: Home / Self Care | Payer: Medicare Other | Attending: Student | Admitting: Student

## 2021-10-03 ENCOUNTER — Encounter: Payer: Self-pay | Admitting: Hematology

## 2021-10-03 DIAGNOSIS — Z86711 Personal history of pulmonary embolism: Secondary | ICD-10-CM | POA: Diagnosis not present

## 2021-10-03 DIAGNOSIS — I441 Atrioventricular block, second degree: Secondary | ICD-10-CM | POA: Diagnosis not present

## 2021-10-03 DIAGNOSIS — I459 Conduction disorder, unspecified: Secondary | ICD-10-CM

## 2021-10-03 DIAGNOSIS — I10 Essential (primary) hypertension: Secondary | ICD-10-CM | POA: Diagnosis not present

## 2021-10-03 LAB — BASIC METABOLIC PANEL
Anion gap: 7 (ref 5–15)
BUN: 19 mg/dL (ref 8–23)
CO2: 26 mmol/L (ref 22–32)
Calcium: 9 mg/dL (ref 8.9–10.3)
Chloride: 106 mmol/L (ref 98–111)
Creatinine, Ser: 0.7 mg/dL (ref 0.44–1.00)
GFR, Estimated: >60 mL/min (ref >60)
Glucose, Bld: 86 mg/dL (ref 70–99)
Potassium: 3.5 mmol/L (ref 3.5–5.1)
Sodium: 139 mmol/L (ref 135–145)

## 2021-10-03 LAB — MAGNESIUM: Magnesium: 2.1 mg/dL (ref 1.7–2.4)

## 2021-10-03 MED ORDER — AMLODIPINE BESYLATE 5 MG PO TABS
5.0000 mg | ORAL_TABLET | Freq: Every day | ORAL | 0 refills | Status: DC
  Filled 2021-10-03: qty 30, 30d supply, fill #0

## 2021-10-03 MED ORDER — AMLODIPINE BESYLATE 5 MG PO TABS
5.0000 mg | ORAL_TABLET | Freq: Every day | ORAL | Status: DC
  Administered 2021-10-03: 5 mg via ORAL
  Filled 2021-10-03: qty 1

## 2021-10-03 MED ORDER — HYDRALAZINE HCL 100 MG PO TABS
100.0000 mg | ORAL_TABLET | Freq: Three times a day (TID) | ORAL | 0 refills | Status: DC
  Filled 2021-10-03: qty 90, 30d supply, fill #0

## 2021-10-03 MED ORDER — POTASSIUM CHLORIDE CRYS ER 20 MEQ PO TBCR
40.0000 meq | EXTENDED_RELEASE_TABLET | Freq: Once | ORAL | Status: AC
Start: 2021-10-03 — End: 2021-10-03
  Administered 2021-10-03: 40 meq via ORAL
  Filled 2021-10-03: qty 2

## 2021-10-03 MED ORDER — HEPARIN SOD (PORK) LOCK FLUSH 100 UNIT/ML IV SOLN
500.0000 [IU] | INTRAVENOUS | Status: AC | PRN
  Administered 2021-10-03: 500 [IU]

## 2021-10-03 NOTE — Progress Notes (Signed)
Went over discharge paperwork with patient and daughter. All questions answered, PIV removed. All belongings at bedside.  ?

## 2021-10-03 NOTE — Progress Notes (Addendum)
? ?Progress Note ? ?Patient Name: Sarah [name] ?Date of Encounter: 10/03/2021 ? ?[facility] Cardiologist: Buford Dresser, MD  ? ?Subjective  ? ?No acute overnight events. No complaints this morning. No lightheadedness or dizziness. She continues to have intermittent Wenckebach. She is still having some confusion. ? ?Inpatient Medications  ?  ?Scheduled Meds: ? apixaban  2.5 mg Oral BID  ? atorvastatin  20 mg Oral Daily  ? Chlorhexidine Gluconate Cloth  6 each Topical Daily  ? citalopram  10 mg Oral Daily  ? furosemide  20 mg Oral Daily  ? hydrALAZINE  100 mg Oral TID  ? levothyroxine  25 mcg Oral QAC breakfast  ? sodium chloride flush  3 mL Intravenous Q12H  ? zolpidem  5 mg Oral QHS  ? ?Continuous Infusions: ? sodium chloride    ? ?PRN Meds: ?sodium chloride, acetaminophen **OR** acetaminophen, polyvinyl alcohol, sodium chloride flush  ? ?Vital Signs  ?  ?Vitals:  ? 10/02/21 1655 10/02/21 2011 10/02/21 2012 10/03/21 0515  ?BP:  (!) 154/74  (!) 165/80  ?Pulse:      ?Resp:  17 20 18   ?Temp:   97.8 ?F (36.6 ?C)   ?TempSrc:   Oral   ?SpO2:    96%  ?Weight: 49.8 kg     ?Height: 5\' 4"  (1.626 m)     ? ? ?Intake/Output Summary (Last 24 hours) at 10/03/2021 1041 ?Last data filed at 10/02/2021 1300 ?Gross per 24 hour  ?Intake 240 ml  ?Output --  ?Net 240 ml  ? ? ?  10/02/2021  ?  4:55 PM 09/30/2021  ?  4:32 PM 07/26/2021  ? 12:51 PM  ?Last 3 Weights  ?Weight (lbs) 109 lb 12.8 oz 103 lb 111 lb  ?Weight (kg) 49.805 kg 46.72 kg 50.349 kg  ?   ? ?Telemetry  ?  ?Normal sinus rhythm with Wenckebach. Rates in the 48s to 23s.  - Personally Reviewed ? ?ECG  ?  ?Normal sinus rhythm, rate 52 bpm, with LVH, slight ST depression in leads V5-V6, and type 1 second degree AV block (Wenckebach).  - Personally Reviewed ? ?Physical Exam  ? ?GEN: No acute distress.   ?Neck: No JVD. ?Cardiac: Irregular rhythm with normal rate. No murmurs, rubs, or gallops.  ?Respiratory: Clear to auscultation bilaterally. No wheezes, rhonchi, or  rales. ?GI: Soft, non-distended, and non-tender.  ?MS: Trace lower extremity edema bilaterally. No deformity. ?Skin: Warm and dry. ?Neuro:  No focal deficits. ?Psych: Normal affect. ? ?Labs  ?  ?High Sensitivity Troponin:  No results for input(s): TROPONINIHS in the last 720 hours.   ?Chemistry ?Recent Labs  ?Lab 09/30/21 ?2054 10/01/21 ?2320 10/02/21 ?0500 10/03/21 ?0615  ?NA 144  --  142 139  ?K 3.4*  --  3.2* 3.5  ?CL 106  --  106 106  ?CO2 28  --  30 26  ?GLUCOSE 88  --  87 86  ?BUN 27*  --  16 19  ?CREATININE 0.63  --  0.66 0.70  ?CALCIUM 9.5  --  9.3 9.0  ?MG  --  2.2  --  2.1  ?PROT 5.9*  --   --   --   ?ALBUMIN 4.2  --   --   --   ?AST 26  --   --   --   ?ALT 34  --   --   --   ?ALKPHOS 60  --   --   --   ?BILITOT 0.7  --   --   --   ?  GFRNONAA >60  --  >60 >60  ?ANIONGAP 10  --  6 7  ?  ?Lipids No results for input(s): CHOL, TRIG, HDL, LABVLDL, LDLCALC, CHOLHDL in the last 168 hours.  ?Hematology ?Recent Labs  ?Lab 09/30/21 ?2054 10/02/21 ?0500  ?WBC 3.9* 3.5*  ?RBC 4.21 4.06  ?HGB 12.5 12.5  ?HCT 39.1 37.7  ?MCV 92.9 92.9  ?MCH 29.7 30.8  ?MCHC 32.0 33.2  ?RDW 14.4 14.2  ?PLT 104* 93*  ? ?Thyroid  ?Recent Labs  ?Lab 10/01/21 ?2320  ?TSH 1.917  ?  ?BNPNo results for input(s): BNP, PROBNP in the last 168 hours.  ?DDimer No results for input(s): DDIMER in the last 168 hours.  ? ?Radiology  ?  ?DG CHEST PORT 1 VIEW ? ?Result Date: 10/01/2021 ?CLINICAL DATA:  93061 weakness EXAM: PORTABLE CHEST 1 VIEW COMPARISON:  Chest radiograph dated January 26, 2021 FINDINGS: LEFT basilar atelectasis.Atherosclerotic calcifications of the tortuous thoracic aorta. RIGHT chest port with tip terminating over the superior cavoatrial junction. Similar biapical scarring. No pleural effusion. No pneumothorax. LEFT basilar platelike opacity most consistent with atelectasis. Visualized abdomen is unremarkable. IMPRESSION: LEFT basilar atelectasis. Electronically Signed   By: Valentino Saxon M.D.   On: 10/01/2021 19:32   ? ?Cardiac  Studies  ? ?Echocardiogram 05/16/2021: ?Impressions: ?1. Left ventricular ejection fraction, by estimation, is 55 to 60%. The  ?left ventricle has normal function. The left ventricle has no regional  ?wall motion abnormalities. There is mild left ventricular hypertrophy.  ?Left ventricular diastolic parameters  ?are indeterminate.  ? 2. Right ventricular systolic function is normal. The right ventricular  ?size is mildly enlarged. There is normal pulmonary artery systolic  ?pressure.  ? 3. Left atrial size was severely dilated.  ? 4. Right atrial size was severely dilated.  ? 5. The mitral valve is normal in structure. Trivial mitral valve  ?regurgitation. No evidence of mitral stenosis.  ? 6. Tricuspid valve regurgitation is mild to moderate.  ? 7. The aortic valve is normal in structure. Aortic valve regurgitation is  ?trivial. No aortic stenosis is present.  ? 8. Aortic dilatation noted. There is borderline dilatation of the  ?ascending aorta, measuring 39 mm.  ? 9. The inferior vena cava is dilated in size with >50% respiratory  ?variability, suggesting right atrial pressure of 8 mmHg.  ? ? ?Patient Profile  ?   ?88 y.o. female with a history of prior PE on Eliquis, hypothyroidism, breast cancer and lymphoma who presented on 10/01/2021 for further evaluation of confusion and was found to be bradycardia with 2 degree type 1 AV degree block (Wenckebach).  ? ?Assessment & Plan  ?  ?Sinus Bradycardia ?Wenckebach ?Presented with confusion and was found to be bradycardia with 2 degree type 1 AV degree block (Wenckebach). Rates were initially in the 71s. TSH normal. Potassium slightly slow. Magnesium normal. Home Coreg 25mg  twice daily was stopped. ?- She continues to have intermittent Wenckebach but rates improved off Coreg. Rates currently in the 50s to 87s. ?- Continue to avoid any AV nodal agents.  ?- Will place 2 week live monitor prior to discharge. Will arrange follow-up in our office after this. ? ?History of  PE ?On Eliquis 2.5mg  twice daily. ? ?Hypertension ?BP elevated with systolic BP in the 867Y to 170s.  ?- Home regimen includes: Amlodipine 2.5 mg daily (recently started), Coreg 25mg  twice daily, Hydralazine 50mg  three times daily. ?- Home Coreg as been stopped due to bradycardia and Wenckebach as above. ?- Home  Hydralazine increased to 100mg  three times daily.  ?- Will restart Amlodipine at 5mg  daily. ? ?Hypokalemia ?Potassium 3.2 yesterday. Supplement and 3.5 today. ?- Will give another dose of KCl 40 mEq today.  ? ?Otherwise, per primary team: ?- Confusion ?- Hypothyroidism ?- Thrombocytopenia ?- Lymphopenia/ history of lymphoma ? ?[facility] will sign off.   ?Medication Recommendations: Amlodipine 5 mg daily, hydralazine 100 mg 3 times daily.  Discontinued carvedilol ?Other recommendations (labs, testing, etc): Zio patch x2 weeks on discharge ?Follow up as an outpatient: Will schedule ? ? ?For questions or updates, please contact [facility] ?Please consult [email or web address] for contact info under  ? ?  ?   ?Signed, ?Darreld Mclean, PA-C  ?10/03/2021, 10:41 AM   ? ? ?Patient seen and examined.  Agree with above documentation.  On exam, patient is alert and oriented, regular rate and rhythm, no murmurs, lungs CTAB, no LE edema .  Continues to have intermittent Wenckebach.  Heart rates are improved off Coreg, currently in 50s to 22s.  Will plan Zio patch x2 weeks on discharge. ? ?Donato Heinz, MD ? ? ?

## 2021-10-03 NOTE — Discharge Summary (Signed)
?Physician Discharge Summary ?  ?Patient: Sarah [name] MRN: [id number] DOB: [birth year]  ?Admit date:     09/30/2021  ?Discharge date: 10/03/21  ?Discharge Physician: Patrecia Pour  ? ?PCP: Lajean Manes, MD  ? ?Recommendations at discharge:  ?Follow up with cardiology 6/7. Admitted for 2nd degree AV block with rate in 40's. Discharged with heart monitor (Zio patch), improved heart rate holding coreg. Increased doses of norvasc and hydralazine for HTN.  ?Suggest recheck CBC, BMP. ?Follow up with oncology, Dr. Irene Limbo, for lymphoma and thrombocytopenia. ? ?Discharge Diagnoses: ?Principal Problem: ?  Second degree heart block type 1 ?Active Problems: ?  Mild cognitive disorder ?  Hypokalemia ?  Hypertension ?  Hyperlipemia ?  History of pulmonary embolism ?  Hypothyroidism ?  Thrombocytopenia ([place]) ?  Heart block ? ?Hospital Course: ?Sarah [name] is a 88 y.o. female with a history of lymphoma, breast CA, PE, HTN, HLD, hypothyroidism who presented from ILF due to mild confusion which resolved in the ED. She did admit to some lightheadedness over the past week. Work-up remarkable mostly for type 1 2nd degree AV block on ECG with 1st degree block and rates into 40bpm's also noted on monitor. Cardiology was consulted, recommending inpatient monitor of heart rate as coreg is held. Bradycardia improved and patient was asymptomatic. Cardiology has placed Zio patch, cleared her for discharge with follow up.   ? ?Assessment and Plan: ?Second degree heart block type 1: Rate improving, asymptomatic.  ?- Zio patch placed and cleared for discharge holding coreg. Return precautions reviewed. Follow up with cardiology 6/7 at 10:05am. ?  ?HTN: BP elevated, improving with medication changes. ?- Augment hydralazine and norvasc, continue lasix as she was taking PTA. Note history of reaction to lisinopril and thiazides. ?  ?Mild cognitive disorder: Chronic findings on CT here. Recent MRI confirmed cerebral atrophy as well, followed by  Dr. April Manson (neurology).  ?- At baseline currently per daughter.   ?  ?Hypokalemia: Resolved with supplementation ?  ?Hyperlipemia:  ?- Continue statin ?  ?History of pulmonary embolism: No current evidence of PE.  ?- Continue eliquis. ?  ?Hypothyroidism: TSH 1.917.  ?- Continue home synthroid.  ?  ?Thrombocytopenia: With giant platelets noted on smear. Chronic, no bleeding.  ?- Monitor at follow up ?  ?Lymphopenia, history T cell lymphoma: Abnormal morphology on smear.  ?- Follow up with Dr. Irene Limbo. ? ?Consultants: Cardiology ?Procedures performed: None  ?Disposition:  ILF ?Diet recommendation:  ?Discharge Diet Orders (From admission, onward)  ? ?  Start     Ordered  ? 10/03/21 0000  Diet - low sodium heart healthy       ? 10/03/21 1235  ? ?  ?  ? ?  ? ?Cardiac diet ?DISCHARGE MEDICATION: ?Allergies as of 10/03/2021   ? ?   Reactions  ? Alendronate Sodium Other (See Comments)  ? UNK reaction  ? Amoxicillin-pot Clavulanate Other (See Comments)  ? UNK reaction  ? Codeine Other (See Comments)  ? Reaction not recalled  ? Lisinopril Other (See Comments)  ? UNK reaction  ? Septra [sulfamethoxazole-trimethoprim] Other (See Comments)  ? Thiazide-type Diuretics Other (See Comments)  ? Blurry vision?? (patient stated she has macular degeneration)  ? Latex Rash  ? ?  ? ?  ?Medication List  ?  ? ?STOP taking these medications   ? ?carvedilol 25 MG tablet ?Commonly known as: COREG ?  ? ?  ? ?TAKE these medications   ? ?acetaminophen 500 MG tablet ?Commonly  known as: TYLENOL ?Take 1,000 mg by mouth every 6 (six) hours as needed for moderate pain. ?  ?amLODipine 5 MG tablet ?Commonly known as: NORVASC ?Take 1 tablet (5 mg total) by mouth daily. Started 5 days ago ?What changed:  ?medication strength ?how much to take ?  ?atorvastatin 20 MG tablet ?Commonly known as: LIPITOR ?Take 1 tablet (20 mg total) by mouth daily. ?What changed: when to take this ?  ?citalopram 10 MG tablet ?Commonly known as: CELEXA ?Take 10 mg by mouth daily. ?   ?Eliquis 2.5 MG Tabs tablet ?Generic drug: apixaban ?TAKE 1 TABLET TWICE A DAY ?What changed: how much to take ?  ?furosemide 20 MG tablet ?Commonly known as: LASIX ?Take 20-40 mg by mouth See admin instructions. M W F = 2 tabs  ( 40 mg) daily, T TH S S  20 mg (1 tab)  daily ?  ?hydrALAZINE 100 MG tablet ?Commonly known as: APRESOLINE ?Take 1 tablet (100 mg total) by mouth 3 (three) times daily. ?What changed:  ?medication strength ?how much to take ?  ?levothyroxine 25 MCG tablet ?Commonly known as: SYNTHROID ?Take 25 mcg by mouth daily before breakfast. ?  ?lidocaine-prilocaine cream ?Commonly known as: EMLA ?Apply to affected area once ?What changed:  ?how much to take ?how to take this ?when to take this ?additional instructions ?  ?ondansetron 8 MG tablet ?Commonly known as: Zofran ?Take 1 tablet (8 mg total) by mouth 2 (two) times daily as needed. Start on the third day after chemotherapy. ?What changed: reasons to take this ?  ?PRESERVISION AREDS PO ?Take 1 capsule by mouth 2 (two) times daily. ?  ?sodium chloride 5 % ophthalmic ointment ?Commonly known as: MURO 128 ?Place 1 application into both eyes at bedtime. ?  ?SUPER B COMPLEX PO ?Take 1 tablet by mouth daily. ?  ?Systane 0.4-0.3 % Gel ophthalmic gel ?Generic drug: Polyethyl Glycol-Propyl Glycol ?Place 1 application into both eyes daily as needed (dry eyes). ?  ?Vitamin D 50 MCG (2000 UT) tablet ?Take 2,000 Units by mouth 2 (two) times daily. ?  ?zolpidem 5 MG tablet ?Commonly known as: AMBIEN ?Take 5 mg by mouth at bedtime. ?  ? ?  ? ? Follow-up Information   ? ? Sande Rives E, PA-C Follow up.   ?Specialties: Physician Assistant, Cardiology ?Why: Hospital follow-up with Cardiology scheduled for 10/26/2021 at 10:05am at our [facility]. Please arrive 15 minutes early for check-in. If this date/time does not work for you, please call our office to reschedule. ?Contact information: ?[address] ?[address] ?[place]  [address] ?[phone] ? ? ?  ?  ? ? Lajean Manes, MD Follow up.   ?Specialty: Internal Medicine ?Contact information: ?[address] ?[address] ?[place] [address] ?[phone] ? ? ?  ?  ? ? Buford Dresser, MD .   ?Specialty: Cardiology ?Contact information: ?[address] ?[address] ?[place] [address] ?[phone] ? ? ?  ?  ? ?  ?  ? ?  ? ?Discharge Exam: ?Filed Weights  ? 09/30/21 1632 10/02/21 1655  ?Weight: 46.7 kg 49.8 kg  ?BP (!) 165/80   Pulse (!) 53   Temp 97.8 ?F (36.6 ?C) (Oral)   Resp 18   Ht 5\' 4"  (1.626 m)   Wt 49.8 kg   SpO2 96%   BMI 18.85 kg/m?   ?No distress ?Irregular rhythm, rate overall in high 50's.  ?Clear, nonlabored ?No focal deficits ? ?Condition at discharge: stable ? ?  The results of significant diagnostics from this hospitalization (including imaging, microbiology, ancillary and laboratory) are listed below for reference.  ? ?Imaging Studies: ?CT Head Wo Contrast ? ?Result Date: 09/30/2021 ?CLINICAL DATA:  Altered mental status. EXAM: CT HEAD WITHOUT CONTRAST TECHNIQUE: Contiguous axial images were obtained from the base of the skull through the vertex without intravenous contrast. RADIATION DOSE REDUCTION: This exam was performed according to the departmental dose-optimization program which includes automated exposure control, adjustment of the mA and/or kV according to patient size and/or use of iterative reconstruction technique. COMPARISON:  May 14, 2021 FINDINGS: Brain: There is mild cerebral atrophy with widening of the extra-axial spaces and ventricular dilatation. There are areas of decreased attenuation within the white matter tracts of the supratentorial brain, consistent with microvascular disease changes. Vascular: No hyperdense vessel or unexpected calcification. Skull: Normal. Negative for fracture or focal lesion. Sinuses/Orbits: No acute finding. Other: None. IMPRESSION: 1. No acute intracranial abnormality. 2. Generalized cerebral atrophy  with chronic white matter small vessel ischemic changes. Electronically Signed   By: Virgina Norfolk M.D.   On: 09/30/2021 20:02  ? ?DG CHEST PORT 1 VIEW ? ?Result Date: 10/01/2021 ?CLINICAL DATA:  93061 weakness EXAM: PORTABLE

## 2021-10-04 ENCOUNTER — Ambulatory Visit: Payer: Medicare Other | Admitting: Hematology

## 2021-10-04 ENCOUNTER — Other Ambulatory Visit: Payer: Medicare Other

## 2021-10-05 ENCOUNTER — Inpatient Hospital Stay: Payer: Medicare Other

## 2021-10-05 ENCOUNTER — Inpatient Hospital Stay: Payer: Medicare Other | Attending: Hematology

## 2021-10-05 ENCOUNTER — Other Ambulatory Visit: Payer: Self-pay

## 2021-10-05 ENCOUNTER — Inpatient Hospital Stay (HOSPITAL_BASED_OUTPATIENT_CLINIC_OR_DEPARTMENT_OTHER): Payer: Medicare Other | Admitting: Hematology

## 2021-10-05 VITALS — BP 127/82 | HR 77 | Temp 97.5°F | Resp 18 | Wt 109.9 lb

## 2021-10-05 DIAGNOSIS — Z7901 Long term (current) use of anticoagulants: Secondary | ICD-10-CM | POA: Diagnosis not present

## 2021-10-05 DIAGNOSIS — Z853 Personal history of malignant neoplasm of breast: Secondary | ICD-10-CM | POA: Diagnosis not present

## 2021-10-05 DIAGNOSIS — C8440 Peripheral T-cell lymphoma, not classified, unspecified site: Secondary | ICD-10-CM | POA: Diagnosis not present

## 2021-10-05 DIAGNOSIS — C865 Angioimmunoblastic T-cell lymphoma: Secondary | ICD-10-CM | POA: Diagnosis not present

## 2021-10-05 DIAGNOSIS — I441 Atrioventricular block, second degree: Secondary | ICD-10-CM | POA: Diagnosis not present

## 2021-10-05 DIAGNOSIS — G629 Polyneuropathy, unspecified: Secondary | ICD-10-CM

## 2021-10-05 DIAGNOSIS — I2699 Other pulmonary embolism without acute cor pulmonale: Secondary | ICD-10-CM | POA: Diagnosis not present

## 2021-10-05 DIAGNOSIS — D696 Thrombocytopenia, unspecified: Secondary | ICD-10-CM | POA: Insufficient documentation

## 2021-10-05 DIAGNOSIS — Z95828 Presence of other vascular implants and grafts: Secondary | ICD-10-CM

## 2021-10-05 LAB — CMP (CANCER CENTER ONLY)
ALT: 24 U/L (ref 0–44)
AST: 20 U/L (ref 15–41)
Albumin: 4.2 g/dL (ref 3.5–5.0)
Alkaline Phosphatase: 77 U/L (ref 38–126)
Anion gap: 8 (ref 5–15)
BUN: 29 mg/dL — ABNORMAL HIGH (ref 8–23)
CO2: 30 mmol/L (ref 22–32)
Calcium: 9.9 mg/dL (ref 8.9–10.3)
Chloride: 105 mmol/L (ref 98–111)
Creatinine: 0.83 mg/dL (ref 0.44–1.00)
GFR, Estimated: >60 mL/min (ref >60)
Glucose, Bld: 132 mg/dL — ABNORMAL HIGH (ref 70–99)
Potassium: 3.7 mmol/L (ref 3.5–5.1)
Sodium: 143 mmol/L (ref 135–145)
Total Bilirubin: 0.8 mg/dL (ref 0.3–1.2)
Total Protein: 6.6 g/dL (ref 6.5–8.1)

## 2021-10-05 LAB — CBC WITH DIFFERENTIAL/PLATELET
Abs Immature Granulocytes: 0.02 10*3/uL (ref 0.00–0.07)
Basophils Absolute: 0 10*3/uL (ref 0.0–0.1)
Basophils Relative: 1 %
Eosinophils Absolute: 0.2 10*3/uL (ref 0.0–0.5)
Eosinophils Relative: 3 %
HCT: 42.1 % (ref 36.0–46.0)
Hemoglobin: 14.1 g/dL (ref 12.0–15.0)
Immature Granulocytes: 0 %
Lymphocytes Relative: 10 %
Lymphs Abs: 0.6 10*3/uL — ABNORMAL LOW (ref 0.7–4.0)
MCH: 30.9 pg (ref 26.0–34.0)
MCHC: 33.5 g/dL (ref 30.0–36.0)
MCV: 92.1 fL (ref 80.0–100.0)
Monocytes Absolute: 0.6 10*3/uL (ref 0.1–1.0)
Monocytes Relative: 11 %
Neutro Abs: 4 10*3/uL (ref 1.7–7.7)
Neutrophils Relative %: 75 %
Platelets: 149 10*3/uL — ABNORMAL LOW (ref 150–400)
RBC: 4.57 MIL/uL (ref 3.87–5.11)
RDW: 14.2 % (ref 11.5–15.5)
WBC: 5.4 10*3/uL (ref 4.0–10.5)
nRBC: 0 % (ref 0.0–0.2)

## 2021-10-05 LAB — LACTATE DEHYDROGENASE: LDH: 206 U/L — ABNORMAL HIGH (ref 98–192)

## 2021-10-05 MED ORDER — HEPARIN SOD (PORK) LOCK FLUSH 100 UNIT/ML IV SOLN
500.0000 [IU] | Freq: Once | INTRAVENOUS | Status: AC
  Administered 2021-10-05: 500 [IU]

## 2021-10-05 MED ORDER — SODIUM CHLORIDE 0.9% FLUSH
10.0000 mL | Freq: Once | INTRAVENOUS | Status: AC
  Administered 2021-10-05: 10 mL

## 2021-10-05 NOTE — Progress Notes (Signed)
HEMATOLOGY/ONCOLOGY CLINIC NOTE  Date of Service: 10/05/2021   Patient Care Team: Lajean Manes, MD as PCP - General (Internal Medicine) Buford Dresser, MD as PCP - Cardiology (Cardiology)  REFERRING PHYSICIAN: Lajean Manes, MD  CHIEF COMPLAINTS/PURPOSE OF CONSULTATION:   Continued follow-up for evaluation and management of angioblastic T-cell lymphoma  HISTORY OF PRESENTING ILLNESS:  Please see previous notes for details on initial presentation  INTERVAL HISTORY:  Sarah [name] is a 88 y.o. female here for follow-up accompanied by her daughter for further evaluation and management of her angioimmunoblastic T-cell lymphoma. Her last clinic visit with [place] was 07/06/2021. She presents today accompanied by her daughter. She reports She is doing well with no new symptoms or concerns.  She reports that she has been experiencing waves of nausea that she says last about 1-2 minutes. She notes no issues with acid reflux. She further notes the episodes are not very frequent. She says that she does not know what causes the nausea and is not currently taking any medications to manage this.  She notes she is eating well and says she maintains a healthy appetite.   She reports that she has been dealing with the death of her cat who passed away suddenly.  No abdominal pain or distension. No issues breathing. No dizziness or confusion. No new lumps or bumps. No new pruritus. No fevers no chills no night sweats. No other new or acute focal symptoms.  Labs done today were reviewed in detail. CBC stable with normal hemoglobin 14.1, WBC count of 5.4k and mild thrombocytopenia with platelets of 149k CMP unremarkable. LDH at 206.  MEDICAL HISTORY:  Past Medical History:  Diagnosis Date   Allergy    codeine, thiazides   Anxiety    new dx   Arthritis    Breast cancer ([place]) 03/14/12   bx=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,ER/PR=+,upper inner quad   Cancer  ([facility])    breast   Cataract    DVT (deep venous thrombosis) ([place]) 02/2019   left leg   Dyspnea    Edema    both legs feet and toe, abdomen   Glaucoma    laser treated years ago   HOH (hard of hearing)    Hyperlipemia    Hypertension    Hypothyroidism    Pancreatic cyst    benign   PONV (postoperative nausea and vomiting)    Pulmonary embolism ([place]) 02/2019   bilateral    Radiation 06/11/2012-07/12/2012   17 sessions 4250 cGy, 3 sessions 750 cGy   Vertigo    Wears glasses    Wears partial dentures    partial upper     SURGICAL HISTORY: Past Surgical History:  Procedure Laterality Date   ABDOMINAL HYSTERECTOMY  1966   1/2 ovary left in    [place]   lumpectomy-lt   CATARACT EXTRACTION     b/l   COLONOSCOPY     EXCISION MASS NECK Left 03/17/2019    EXCISION MASS NECK (Left Neck)   EXCISION MASS NECK Left 03/17/2019   Procedure: EXCISION MASS NECK;  Surgeon: Helayne Seminole, MD;  Location: [facility];  Service: ENT;  Laterality: Left;   EYE SURGERY Bilateral    bilateral cataract removal   IR IMAGING GUIDED PORT INSERTION  04/23/2019   JOINT REPLACEMENT  2013   rt total knee   JOINT REPLACEMENT  1995   lt total knee   PARTIAL MASTECTOMY WITH NEEDLE LOCALIZATION  04/09/2012   Procedure:  PARTIAL MASTECTOMY WITH NEEDLE LOCALIZATION;  Surgeon: Adin Hector, MD;  Location: [facility];  Service: General;  Laterality: Right;   SKIN BIOPSY Left 03/17/2019   LEFT THIGH   SKIN BIOPSY Left 03/17/2019   Procedure: Skin Biopsy Left Thigh;  Surgeon: Helayne Seminole, MD;  Location: [facility];  Service: ENT;  Laterality: Left;   TONSILLECTOMY     TOTAL KNEE ARTHROPLASTY  08/16/2011   Procedure: TOTAL KNEE ARTHROPLASTY;  Surgeon: Ninetta Lights, MD;  Location: [facility];  Service: Orthopedics;  Laterality: Right;     SOCIAL HISTORY: Social History   Socioeconomic History   Marital status: Widowed    Spouse name: Not on file   Number of children:  2   Years of education: Not on file   Highest education level: Not on file  Occupational History   Occupation: Retired    Comment: Community education officer   Tobacco Use   Smoking status: Never   Smokeless tobacco: Never  Vaping Use   Vaping Use: Never used  Substance and Sexual Activity   Alcohol use: No   Drug use: No   Sexual activity: Not Currently  Other Topics Concern   Not on file  Social History Narrative   Not on file   Social Determinants of Health   Financial Resource Strain: Not on file  Food Insecurity: Not on file  Transportation Needs: Not on file  Physical Activity: Not on file  Stress: Not on file  Social Connections: Not on file  Intimate Partner Violence: Not on file     FAMILY HISTORY: Family History  Problem Relation Age of Onset   Heart disease Father    Cancer Maternal Aunt        stomach     ALLERGIES:   is allergic to alendronate sodium, amoxicillin-pot clavulanate, codeine, lisinopril, septra [sulfamethoxazole-trimethoprim], thiazide-type diuretics, and latex.   MEDICATIONS:  Current Outpatient Medications  Medication Sig Dispense Refill   acetaminophen (TYLENOL) 500 MG tablet Take 1,000 mg by mouth every 6 (six) hours as needed for moderate pain.     amLODipine (NORVASC) 5 MG tablet Take 1 tablet (5 mg total) by mouth daily. Started 5 days ago 30 tablet 0   atorvastatin (LIPITOR) 20 MG tablet Take 1 tablet (20 mg total) by mouth daily. (Patient taking differently: Take 20 mg by mouth every evening.) 30 tablet 3   B Complex-C (SUPER B COMPLEX PO) Take 1 tablet by mouth daily.     Cholecalciferol (VITAMIN D) 50 MCG (2000 UT) tablet Take 2,000 Units by mouth 2 (two) times daily.     citalopram (CELEXA) 10 MG tablet Take 10 mg by mouth daily.     ELIQUIS 2.5 MG TABS tablet TAKE 1 TABLET TWICE A DAY (Patient taking differently: Take 2.5 mg by mouth 2 (two) times daily.) 180 tablet 1   furosemide (LASIX) 20 MG tablet Take 20-40 mg by mouth See admin  instructions. M W F = 2 tabs  ( 40 mg) daily, T TH S S  20 mg (1 tab)  daily     hydrALAZINE (APRESOLINE) 100 MG tablet Take 1 tablet (100 mg total) by mouth 3 (three) times daily. 90 tablet 0   levothyroxine (SYNTHROID) 25 MCG tablet Take 25 mcg by mouth daily before breakfast.      lidocaine-prilocaine (EMLA) cream Apply to affected area once (Patient taking differently: Apply 1 application. topically See admin instructions. Apply to port site prior to port access) 30  g 3   Multiple Vitamins-Minerals (PRESERVISION AREDS PO) Take 1 capsule by mouth 2 (two) times daily.      ondansetron (ZOFRAN) 8 MG tablet Take 1 tablet (8 mg total) by mouth 2 (two) times daily as needed. Start on the third day after chemotherapy. (Patient taking differently: Take 8 mg by mouth 2 (two) times daily as needed for nausea or vomiting. Start on the third day after chemotherapy.) 30 tablet 1   Polyethyl Glycol-Propyl Glycol (SYSTANE) 0.4-0.3 % GEL ophthalmic gel Place 1 application into both eyes daily as needed (dry eyes).     sodium chloride (MURO 128) 5 % ophthalmic ointment Place 1 application into both eyes at bedtime.     zolpidem (AMBIEN) 5 MG tablet Take 5 mg by mouth at bedtime.     No current facility-administered medications for this visit.     REVIEW OF SYSTEMS:   10 Point review of Systems was done is negative except as noted above.  PHYSICAL EXAMINATION:  .BP 127/82   Pulse 77   Temp (!) 97.5 F (36.4 C)   Resp 18   Wt 109 lb 14.4 oz (49.9 kg)   SpO2 98%   BMI 18.86 kg/m  NAD GENERAL:alert, in no acute distress and comfortable SKIN: no acute rashes, no significant lesions EYES: conjunctiva are pink and non-injected, sclera anicteric NECK: supple, no JVD LYMPH:  no palpable lymphadenopathy in the cervical, axillary or inguinal regions LUNGS: clear to auscultation b/l with normal respiratory effort HEART: regular rate & rhythm ABDOMEN:  normoactive bowel sounds , non tender, not  distended. Extremity: no pedal edema PSYCH: alert & oriented x 3 with fluent speech NEURO: no focal motor/sensory deficits  Exam performed in chair.  LABORATORY DATA:  I have reviewed the data as listed     Latest Ref Rng & Units 10/05/2021   12:40 PM 10/02/2021    5:00 AM 09/30/2021    8:54 PM  CBC  WBC 4.0 - 10.5 K/uL 5.4   3.5   3.9    Hemoglobin 12.0 - 15.0 g/dL 14.1   12.5   12.5    Hematocrit 36.0 - 46.0 % 42.1   37.7   39.1    Platelets 150 - 400 K/uL 149   93   104         Latest Ref Rng & Units 10/05/2021   12:40 PM 10/03/2021    6:15 AM 10/02/2021    5:00 AM  CMP  Glucose 70 - 99 mg/dL 132   86   87    BUN 8 - 23 mg/dL 29   19   16     Creatinine 0.44 - 1.00 mg/dL 0.83   0.70   0.66    Sodium 135 - 145 mmol/L 143   139   142    Potassium 3.5 - 5.1 mmol/L 3.7   3.5   3.2    Chloride 98 - 111 mmol/L 105   106   106    CO2 22 - 32 mmol/L 30   26   30     Calcium 8.9 - 10.3 mg/dL 9.9   9.0   9.3    Total Protein 6.5 - 8.1 g/dL 6.6      Total Bilirubin 0.3 - 1.2 mg/dL 0.8      Alkaline Phos 38 - 126 U/L 77      AST 15 - 41 U/L 20      ALT 0 - 44 U/L 24       .  Lab Results  Component Value Date   LDH 206 (H) 10/05/2021      04/08/2019 NM PET Image Initial (PI) Skull Base To Thigh (Accession [id number])  04/04/2019 PATHOLOGY   04/07/2019 ECHOCARDIOGRAM  03/28/2019 PATHOLOGY   03/06/2019 CT ANGIOGRAPHY CHEST WITH CONTRAST (Accession [id number])  04/07/2019 ECHOCARDIOGRAM  03/06/2019 ECHOCARDIOGRAM     03/06/2019 PORTABLE CHEST 1 VIEW (Accession [id number])    02/27/2019 CT CHEST, ABDOMEN, AND PELVIS WITH CONTRAST (Accession [id number])    02/17/2019 CHEST XRAY - 2 VIEW (Accession [id number])    RADIOGRAPHIC STUDIES: I have personally reviewed the radiological images as listed and agreed with the findings in the report. CT Head Wo Contrast  Result Date: 09/30/2021 CLINICAL DATA:  Altered mental status. EXAM: CT HEAD WITHOUT CONTRAST TECHNIQUE:  Contiguous axial images were obtained from the base of the skull through the vertex without intravenous contrast. RADIATION DOSE REDUCTION: This exam was performed according to the departmental dose-optimization program which includes automated exposure control, adjustment of the mA and/or kV according to patient size and/or use of iterative reconstruction technique. COMPARISON:  May 14, 2021 FINDINGS: Brain: There is mild cerebral atrophy with widening of the extra-axial spaces and ventricular dilatation. There are areas of decreased attenuation within the white matter tracts of the supratentorial brain, consistent with microvascular disease changes. Vascular: No hyperdense vessel or unexpected calcification. Skull: Normal. Negative for fracture or focal lesion. Sinuses/Orbits: No acute finding. Other: None. IMPRESSION: 1. No acute intracranial abnormality. 2. Generalized cerebral atrophy with chronic white matter small vessel ischemic changes. Electronically Signed   By: Virgina Norfolk M.D.   On: 09/30/2021 20:02   DG CHEST PORT 1 VIEW  Result Date: 10/01/2021 CLINICAL DATA:  93061 weakness EXAM: PORTABLE CHEST 1 VIEW COMPARISON:  Chest radiograph dated January 26, 2021 FINDINGS: LEFT basilar atelectasis.Atherosclerotic calcifications of the tortuous thoracic aorta. RIGHT chest port with tip terminating over the superior cavoatrial junction. Similar biapical scarring. No pleural effusion. No pneumothorax. LEFT basilar platelike opacity most consistent with atelectasis. Visualized abdomen is unremarkable. IMPRESSION: LEFT basilar atelectasis. Electronically Signed   By: Valentino Saxon M.D.   On: 10/01/2021 19:32     ASSESSMENT & PLAN:   Sarah [name] is a 89 y.o. female with:  1. 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 SKIN,  LEFT THIGH, BIOPSY: - Involvement by angioimmunoblastic T-cell lymphoma." -04/07/2019 Echocardiogram - normal EF -04/08/2019 NM PET Image Initial (PI) Skull Base To Thigh (Accession [id number]) revealed "1. Widespread hypermetabolic adenopathy in the neck, chest, and abdomen/pelvis, primarily in the Deauville 4 and Deauville 5 range. There is also hypermetabolic activity along the posterior nasopharynx in lingual tonsillar regions potentially indicating sites of involvement. 2. The subtle hypodensities in the spine and bony pelvis are not appreciably hypermetabolic may be incidental, surveillance is suggested. 3. Bilateral thyroid activity but especially on the left. Probably from thyroiditis. 4. A 6 mm subpleural nodule anteriorly in the right lower lobe not appreciably hypermetabolic but is below sensitive PET-CT size thresholds and merits surveillance. 5. Mildly accentuated diffuse splenic activity without splenomegaly or focal splenic lesion identified. 6. Other imaging findings of potential clinical significance: Aortic Atherosclerosis (ICD10-I70.0). Coronary atherosclerosis. Large right and small left pleural effusions. Moderate cardiomegaly. Small pericardial effusion. Mild mesenteric edema. Large left renal cyst. Fullness of both renal collecting systems with suspected nonobstructive left nephrolithiasis. Degenerative grade 1 anterolisthesis at L4-5 at L5-S1. Trace pelvic ascites." -05/28/2019  PET/CT ([id number]) revealed "Complete metabolic response to therapy". -09/16/2019 PET/CT ([id number]) revealed "1. Unfortunately evidence of lymphoma recurrence at sites of previous hypermetabolic adenopathy. Hypermetabolic lymphoid tissue is new from comparison PET-CT 05/28/2019 but at same locations as PET-CT 04/08/2019. The number of metastatic lymph nodes is less than 04/08/2019. Lymphoid tissue at the RIGHT skull base is larger. 2. Three foci of hypermetabolic recurrence are present, lymphoid tissue at the  RIGHT base of skull, hypermetabolic RIGHT hilar lymph node and hypermetabolic lymph node in the porta hepatis." -11/25/2019 PET/CT ([id number]) revealed "1. Marked interval improvement in the hypermetabolic disease identified previously in the posterior right nasopharynx, compatible with Deauville 4 today. 2. Decreased hypermetabolism in the right hilar and porta hepatis lymph nodes identified previously (Deauville 3). No new sites of hypermetabolic disease in the chest, abdomen, or pelvis. 3. Similar diffuse thyroid uptake, left greater than right. 4. No substantial change in right-sided pulmonary nodules." -04/09/2020 PET/CT ([id number]) revealed "No evidence progressive lymphoma."  2. Pulmonary Embolism -extensive likely related to extensive malignancy  3. H/o Breast Cancer  -The patient had bilateral diagnostic  mammography at [facility] 07/11/2011. This  showed some calcifications in the right  breast, which seemed a little bit more  prominent than prior. In the left breast  there was an area of possible  architectural distortion. However left  breast ultrasound the same day showed  no abnormality. 6 month followup was  suggested, and on 02/28/2012 the  patient again had bilateral diagnostic  mammography, now with right  ultrasonography. The microcalcifications  in the right breast appeared increased.  Ultrasound showed a hypoechoic lesion  measuring 5 mm, with no associated  shadowing. This had been previously  noted and appeared unchanged. Anterior  to that there was a small hypoechoic  mass measuring 4 mm in diameter.  There was felt to be suspicious, and on  03/14/2012 the patient underwent biopsy  of the right breast mass, showing ([id number]  [id number]) ductal carcinoma in situ,  intermediate grade, estrogen receptor  100% and progesterone receptor 100%  positive.    -Bilateral breast MRI was obtained  03/26/2012. This showed only post  biopsy changes in the right breast,  associated with an area of  non-masslike  enhancement measuring 2.4 cm. The  left breast was unremarkable, and there  was no enlarged axillary or internal  mammary adenopathy noted.  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 The patient's  subsequent history is as detailed below.   PLAN: -Labs done today were reviewed in detail. CBC stable with normal hemoglobin 14.1, WBC count of 5.4k and mild thrombocytopenia with platelets of 149k CMP unremarkable. LDH at 206 -- stable. -Patient has no obvious lab or clinical evidence of progression/recurrence of her angioimmunoblastic T-cell lymphoma. -No clear indication for additional treatment of her lymphoma at this time. -She is continue to follow with her primary care physician for management of lower extremity swelling and is on medications for this. I also recommended using bilateral lower extremity knee-high compression socks. -She will continue to be on Eliquis for history of significant PE. -Daughter is very supportive and has been helping her mother -RTC in 4 months with labs -Portflush in 2 months  4.  History of possible CVA, possible PRESS, right ICA aneurysm -Continue follow-up with Dr. Tasia Catchings neurology and Dr. Kathyrn Sheriff neurosurgery as per inpatient  plan.  Follow-up Portflush in 2 months RTC with Dr Irene Limbo with portflush and labs in 4 months  .The total time spent in the appointment was 20 minutes* .  All of the patient's questions were answered with apparent satisfaction. The patient knows to call the clinic with any problems, questions or concerns.   Sullivan Lone MD MS AAHIVMS [facility] Hematology/Oncology Physician [facility]  .*Total Encounter Time as defined by the Centers for Medicare and Medicaid Services includes, in addition to the face-to-face time of a patient visit (documented  in the note above) non-face-to-face time: obtaining and reviewing outside history, ordering and reviewing medications, tests or procedures, care coordination (communications with other health care professionals or caregivers) and documentation in the medical record.

## 2021-10-10 ENCOUNTER — Telehealth: Payer: Self-pay | Admitting: Hematology

## 2021-10-10 ENCOUNTER — Encounter: Payer: Self-pay | Admitting: Hematology

## 2021-10-10 NOTE — Telephone Encounter (Signed)
Scheduled follow-up appointments per 5/17 los. Patient's daughter is aware.

## 2021-10-11 ENCOUNTER — Ambulatory Visit: Payer: Medicare Other | Admitting: Podiatry

## 2021-10-11 ENCOUNTER — Emergency Department (HOSPITAL_COMMUNITY)
Admission: EM | Admit: 2021-10-11 | Discharge: 2021-10-11 | Disposition: A | Discharge status: Home / Self Care | Payer: Medicare Other | Attending: Student | Admitting: Student

## 2021-10-11 ENCOUNTER — Other Ambulatory Visit: Payer: Self-pay

## 2021-10-11 ENCOUNTER — Emergency Department (HOSPITAL_COMMUNITY): Payer: Medicare Other

## 2021-10-11 DIAGNOSIS — Z96651 Presence of right artificial knee joint: Secondary | ICD-10-CM | POA: Diagnosis not present

## 2021-10-11 DIAGNOSIS — R079 Chest pain, unspecified: Secondary | ICD-10-CM | POA: Diagnosis not present

## 2021-10-11 DIAGNOSIS — Z91040 Latex allergy status: Secondary | ICD-10-CM | POA: Diagnosis not present

## 2021-10-11 DIAGNOSIS — Z7901 Long term (current) use of anticoagulants: Secondary | ICD-10-CM | POA: Diagnosis not present

## 2021-10-11 DIAGNOSIS — Z79899 Other long term (current) drug therapy: Secondary | ICD-10-CM | POA: Diagnosis not present

## 2021-10-11 DIAGNOSIS — R110 Nausea: Secondary | ICD-10-CM | POA: Diagnosis not present

## 2021-10-11 DIAGNOSIS — I10 Essential (primary) hypertension: Secondary | ICD-10-CM | POA: Diagnosis not present

## 2021-10-11 DIAGNOSIS — E039 Hypothyroidism, unspecified: Secondary | ICD-10-CM | POA: Diagnosis not present

## 2021-10-11 DIAGNOSIS — Z853 Personal history of malignant neoplasm of breast: Secondary | ICD-10-CM | POA: Insufficient documentation

## 2021-10-11 DIAGNOSIS — R0789 Other chest pain: Secondary | ICD-10-CM | POA: Insufficient documentation

## 2021-10-11 LAB — BASIC METABOLIC PANEL
Anion gap: 11 (ref 5–15)
BUN: 25 mg/dL — ABNORMAL HIGH (ref 8–23)
CO2: 26 mmol/L (ref 22–32)
Calcium: 9.8 mg/dL (ref 8.9–10.3)
Chloride: 105 mmol/L (ref 98–111)
Creatinine, Ser: 0.69 mg/dL (ref 0.44–1.00)
GFR, Estimated: >60 mL/min (ref >60)
Glucose, Bld: 99 mg/dL (ref 70–99)
Potassium: 3.3 mmol/L — ABNORMAL LOW (ref 3.5–5.1)
Sodium: 142 mmol/L (ref 135–145)

## 2021-10-11 LAB — CBC
HCT: 42.1 % (ref 36.0–46.0)
Hemoglobin: 14 g/dL (ref 12.0–15.0)
MCH: 30.6 pg (ref 26.0–34.0)
MCHC: 33.3 g/dL (ref 30.0–36.0)
MCV: 92.1 fL (ref 80.0–100.0)
Platelets: 142 10*3/uL — ABNORMAL LOW (ref 150–400)
RBC: 4.57 MIL/uL (ref 3.87–5.11)
RDW: 14 % (ref 11.5–15.5)
WBC: 4.7 10*3/uL (ref 4.0–10.5)
nRBC: 0 % (ref 0.0–0.2)

## 2021-10-11 LAB — TROPONIN I (HIGH SENSITIVITY)
Troponin I (High Sensitivity): 14 ng/L (ref <18)
Troponin I (High Sensitivity): 13 ng/L (ref <18)

## 2021-10-11 MED ORDER — OMEPRAZOLE 20 MG PO CPDR: 20.0000 mg | DELAYED_RELEASE_CAPSULE | Freq: Every day | ORAL | 0 refills | Status: DC

## 2021-10-11 MED ORDER — HYDRALAZINE HCL 50 MG PO TABS
100.0000 mg | ORAL_TABLET | Freq: Three times a day (TID) | ORAL | Status: DC
  Administered 2021-10-11: 100 mg via ORAL
  Filled 2021-10-11: qty 2

## 2021-10-11 MED ORDER — LIDOCAINE VISCOUS HCL 2 % MT SOLN
15.0000 mL | Freq: Once | OROMUCOSAL | Status: AC
  Administered 2021-10-11: 15 mL via ORAL
  Filled 2021-10-11: qty 15

## 2021-10-11 MED ORDER — ALUM & MAG HYDROXIDE-SIMETH 200-200-20 MG/5ML PO SUSP
30.0000 mL | Freq: Once | ORAL | Status: AC
  Administered 2021-10-11: 30 mL via ORAL
  Filled 2021-10-11: qty 30

## 2021-10-11 NOTE — Progress Notes (Signed)
   On zio patch there were episodes of CHB.  BP for pt stable and EKG without acute changes SR with 1st degree AV block.  Neg troponins.  Before cardiology was able to see pt she was discharged.  I have sent message to EP to arrange to see this pt this week.  Dr. Irish Lack aware as well.  I will leave follow up appt on 6/7 for now.  Cecilie Kicks, FNP-C [facility]  [phone] or after 5pm and on weekends call [phone] 10/11/2021.now

## 2021-10-11 NOTE — ED Triage Notes (Signed)
Pt. Stateed, I started having LIGHT chest pain yesterday and continues today off and on. Denies any other symptoms

## 2021-10-11 NOTE — ED Provider Notes (Addendum)
[facility] EMERGENCY DEPARTMENT Provider Note  CSN: [id number] Arrival date & time: 10/11/21 1117  Chief Complaint(s) Chest Pain  HPI Sarah [name] is a 88 y.o. female with PMH angioimmunoblastic T-cell lymphoma in remission, DVT PE currently on Eliquis and compliant, hypothyroidism, HTN, HLD, recent admission for 2nd degree AV block with discharged on a Zio patch who presents the emergency department for evaluation of chest pain.  Patient states that over the last 3 days she has had a dull ache on the left side of her chest with no associated radiation, shortness of breath, diaphoresis, vomiting or other systemic symptoms.  There is no exertional component to her chest pain.  She endorses waves of nausea that have been ongoing for approximately 1 year and are currently unexplained.  She denies abdominal pain, diarrhea, cough, headache or other systemic symptoms.  No pleurisy.  She states this feels distinctly different than her last pulmonary embolism.   Past Medical History Past Medical History:  Diagnosis Date   Allergy    codeine, thiazides   Anxiety    new dx   Arthritis    Breast cancer ([place]) 03/14/12   bx=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,ER/PR=+,upper inner quad   Cancer (HCC)    breast   Cataract    DVT (deep venous thrombosis) ([place]) 02/2019   left leg   Dyspnea    Edema    both legs feet and toe, abdomen   Glaucoma    laser treated years ago   HOH (hard of hearing)    Hyperlipemia    Hypertension    Hypothyroidism    Pancreatic cyst    benign   PONV (postoperative nausea and vomiting)    Pulmonary embolism ([place]) 02/2019   bilateral    Radiation 06/11/2012-07/12/2012   17 sessions 4250 cGy, 3 sessions 750 cGy   Vertigo    Wears glasses    Wears partial dentures    partial upper   Patient Active Problem List   Diagnosis Date Noted   Heart block 10/02/2021   Second degree heart block type 1 10/01/2021   History of  pulmonary embolism 10/01/2021   Hypokalemia 10/01/2021   Thrombocytopenia ([place]) 10/01/2021   Cerebral thrombosis with cerebral infarction 05/16/2021   Headache 05/15/2021   Neuropathy 10/27/2020   Abnormal gait 06/22/2020   Age-related osteoporosis without current pathological fracture 06/22/2020   Anxiety 06/22/2020   Hardening of the aorta (main artery of the heart) ([place]) 06/22/2020   Malignant lymphoma, non-Hodgkin's ([place]) 06/22/2020   Mild cognitive disorder 06/22/2020   Non-pressure chronic ulcer of unspecified part of unspecified lower leg with unspecified severity ([place]) 06/22/2020   Primary insomnia 06/22/2020   Pulmonary nodule 06/22/2020   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   Abnormal chest x-ray 12/31/2019   Cough 12/31/2019   Port-A-Cath in place 06/04/2019   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[place]) 04/14/2019   Counseling regarding advance care planning and goals of care 04/14/2019   Lymphadenopathy of head and neck 03/17/2019   Lymph nodes enlarged 03/17/2019   History of pulmonary embolus (PE) 03/17/2019   Acute pulmonary embolism ([place]) 03/06/2019   Diffuse lymphadenopathy 03/06/2019   Pleural effusion 03/06/2019   Pruritus 02/06/2019   Pruritic rash 02/06/2019   Angio-edema 02/06/2019   Shortness of breath 02/06/2019   Osteopenia 12/18/2013   Heart block AV first degree 12/18/2013   Breast cancer of upper-inner quadrant of right female breast ([place]) 03/19/2012   Right knee DJD 08/18/2011   Hypothyroidism  Hypertension    PONV (postoperative nausea and vomiting)    Hyperlipemia    Home Medication(s) Prior to Admission medications   Medication Sig Start Date End Date Taking? Authorizing Provider  omeprazole (PRILOSEC) 20 MG capsule Take 1 capsule (20 mg total) by mouth daily. 10/11/21  Yes Dereonna Lensing, MD  acetaminophen (TYLENOL) 500 MG tablet Take 1,000 mg by mouth every 6 (six) hours as needed for moderate pain.    [provider]  amLODipine (NORVASC) 5 MG  tablet Take 1 tablet (5 mg total) by mouth daily. Started 5 days ago 10/03/21   Patrecia Pour, MD  atorvastatin (LIPITOR) 20 MG tablet Take 1 tablet (20 mg total) by mouth daily. Patient taking differently: Take 20 mg by mouth every evening. 05/18/21   Ghimire, Henreitta Leber, MD  B Complex-C (SUPER B COMPLEX PO) Take 1 tablet by mouth daily.    [provider]  Cholecalciferol (VITAMIN D) 50 MCG (2000 UT) tablet Take 2,000 Units by mouth 2 (two) times daily.    [provider]  citalopram (CELEXA) 10 MG tablet Take 10 mg by mouth daily. 02/02/21   [provider]  ELIQUIS 2.5 MG TABS tablet TAKE 1 TABLET TWICE A DAY Patient taking differently: Take 2.5 mg by mouth 2 (two) times daily. 03/28/21   Brunetta Genera, MD  furosemide (LASIX) 20 MG tablet Take 20-40 mg by mouth See admin instructions. M W F = 2 tabs  ( 40 mg) daily, T TH S S  20 mg (1 tab)  daily    [provider]  hydrALAZINE (APRESOLINE) 100 MG tablet Take 1 tablet (100 mg total) by mouth 3 (three) times daily. 10/03/21 11/02/21  Patrecia Pour, MD  levothyroxine (SYNTHROID) 25 MCG tablet Take 25 mcg by mouth daily before breakfast.  09/20/18   [provider]  lidocaine-prilocaine (EMLA) cream Apply to affected area once Patient taking differently: Apply 1 application. topically See admin instructions. Apply to port site prior to port access 04/13/20   Brunetta Genera, MD  Multiple Vitamins-Minerals (PRESERVISION AREDS PO) Take 1 capsule by mouth 2 (two) times daily.     [provider]  ondansetron (ZOFRAN) 8 MG tablet Take 1 tablet (8 mg total) by mouth 2 (two) times daily as needed. Start on the third day after chemotherapy. Patient taking differently: Take 8 mg by mouth 2 (two) times daily as needed for nausea or vomiting. Start on the third day after chemotherapy. 04/14/19   Brunetta Genera, MD  Polyethyl Glycol-Propyl Glycol (SYSTANE) 0.4-0.3 % GEL ophthalmic gel Place 1  application into both eyes daily as needed (dry eyes).    [provider]  sodium chloride (MURO 128) 5 % ophthalmic ointment Place 1 application into both eyes at bedtime.    [provider]  zolpidem (AMBIEN) 5 MG tablet Take 5 mg by mouth at bedtime. 12/07/19   [provider]  Past Surgical History Past Surgical History:  Procedure Laterality Date   ABDOMINAL HYSTERECTOMY  1966   1/2 ovary left in    [place]   lumpectomy-lt   CATARACT EXTRACTION     b/l   COLONOSCOPY     EXCISION MASS NECK Left 03/17/2019    EXCISION MASS NECK (Left Neck)   EXCISION MASS NECK Left 03/17/2019   Procedure: EXCISION MASS NECK;  Surgeon: Helayne Seminole, MD;  Location: [place];  Service: ENT;  Laterality: Left;   EYE SURGERY Bilateral    bilateral cataract removal   IR IMAGING GUIDED PORT INSERTION  04/23/2019   JOINT REPLACEMENT  2013   rt total knee   JOINT REPLACEMENT  1995   lt total knee   PARTIAL MASTECTOMY WITH NEEDLE LOCALIZATION  04/09/2012   Procedure: PARTIAL MASTECTOMY WITH NEEDLE LOCALIZATION;  Surgeon: Adin Hector, MD;  Location: [place];  Service: General;  Laterality: Right;   SKIN BIOPSY Left 03/17/2019   LEFT THIGH   SKIN BIOPSY Left 03/17/2019   Procedure: Skin Biopsy Left Thigh;  Surgeon: Helayne Seminole, MD;  Location: [place];  Service: ENT;  Laterality: Left;   TONSILLECTOMY     TOTAL KNEE ARTHROPLASTY  08/16/2011   Procedure: TOTAL KNEE ARTHROPLASTY;  Surgeon: Ninetta Lights, MD;  Location: [place];  Service: Orthopedics;  Laterality: Right;   Family History Family History  Problem Relation Age of Onset   Heart disease Father    Cancer Maternal Aunt        stomach    Social History Social History   Tobacco Use   Smoking status: Never   Smokeless tobacco: Never  Vaping  Use   Vaping Use: Never used  Substance Use Topics   Alcohol use: No   Drug use: No   Allergies Alendronate sodium, Amoxicillin-pot clavulanate, Codeine, Lisinopril, Septra [sulfamethoxazole-trimethoprim], Thiazide-type diuretics, and Latex  Review of Systems Review of Systems  Cardiovascular:  Positive for chest pain.  Gastrointestinal:  Positive for nausea.   Physical Exam Vital Signs  I have reviewed the triage vital signs BP 140/74   Pulse (!) 34   Temp 98 F (36.7 C) (Oral)   Resp 14   SpO2 98%   Physical Exam Vitals and nursing note reviewed.  Constitutional:      General: She is not in acute distress.    Appearance: She is well-developed.  HENT:     Head: Normocephalic and atraumatic.  Eyes:     Conjunctiva/sclera: Conjunctivae normal.  Cardiovascular:     Rate and Rhythm: Normal rate and regular rhythm.     Heart sounds: No murmur heard. Pulmonary:     Effort: Pulmonary effort is normal. No respiratory distress.     Breath sounds: Normal breath sounds.  Abdominal:     Palpations: Abdomen is soft.     Tenderness: There is no abdominal tenderness.  Musculoskeletal:        General: No swelling.     Cervical back: Neck supple.  Skin:    General: Skin is warm and dry.     Capillary Refill: Capillary refill takes less than 2 seconds.  Neurological:     Mental Status: She is alert.  Psychiatric:        Mood and Affect: Mood normal.    ED Results and Treatments Labs (all labs ordered are listed, but only abnormal results are displayed) Labs Reviewed  BASIC METABOLIC PANEL - Abnormal; Notable for  the following components:      Result Value   Potassium 3.3 (*)    BUN 25 (*)    All other components within normal limits  CBC - Abnormal; Notable for the following components:   Platelets 142 (*)    All other components within normal limits  TROPONIN I (HIGH SENSITIVITY)  TROPONIN I (HIGH SENSITIVITY)                                                                                                                           Radiology DG Chest 2 View  Result Date: 10/11/2021 CLINICAL DATA:  Chest pain.  History of breast cancer and PE. EXAM: CHEST - 2 VIEW COMPARISON:  10/01/2021 FINDINGS: Cardiac enlargement. Negative for heart failure. Atherosclerotic calcification aortic arch. Port-A-Cath tip in the SVC unchanged Lungs are clear without infiltrate effusion or edema. Cardiac monitoring device overlying the left chest is new since the prior study. IMPRESSION: Cardiac enlargement.  No acute cardiopulmonary abnormality. Electronically Signed   By: Franchot Gallo M.D.   On: 10/11/2021 12:09    Pertinent labs & imaging results that were available during my care of the patient were reviewed by me and considered in my medical decision making (see MDM for details).  Medications Ordered in ED Medications  hydrALAZINE (APRESOLINE) tablet 100 mg (100 mg Oral Given 10/11/21 1633)  alum & mag hydroxide-simeth (MAALOX/MYLANTA) 200-200-20 MG/5ML suspension 30 mL (30 mLs Oral Given 10/11/21 1633)    And  lidocaine (XYLOCAINE) 2 % viscous mouth solution 15 mL (15 mLs Oral Given 10/11/21 1633)                                                                                                                                     Procedures Ultrasound ED Echo  Date/Time: 10/11/2021 5:32 PM Performed by: Teressa Lower, MD Authorized by: Teressa Lower, MD   Procedure details:    Indications: chest pain     Views: subxiphoid     Images: not archived   Findings:    Pericardium: no pericardial effusion   Impression:    Impression: normal    (including critical care time)  Medical Decision Making / ED Course   This patient presents to the ED for concern of chest pain, this involves an extensive number of treatment options, and is a complaint that carries with it a high risk of  complications and morbidity.  The differential diagnosis includes ACS, reflux, PE,  pneumonia, costochondritis  MDM: Patient seen emergency room for evaluation of chest pain.  Physical exam is unremarkable.  ECG with no evidence of ischemia, does show sinus rhythm with first-degree AV block versus second-degree type I.  This is unchanged from previous.  Laboratory evaluation unremarkable outside of some mild hypokalemia to 3.3.  Troponin and delta troponin negative.  Chest x-ray with cardiomegaly but otherwise unremarkable.  No evidence of pulmonary edema.  Given patient's history of large waves of nausea, I am concerned that the patient may be experiencing some intermittent reflux and we trialed a GI cocktail here in the emergency department.  On reevaluation, patient's symptoms have completely resolved.  Initial cardiology consult was placed and they stated they would eventually see the patient.  After greater than an hour of no response, and patient's heart score 4, I consulted cardiology and had a discussion with Dr. Harriet Masson.  The patient currently has a Zio patch on and has not received any phone calls for arrhythmias so I have lower suspicion for critical arrhythmia or episodes of symptomatic bradycardia causing this dull chest pain.  The patient has close follow-up with cardiology on June 7 and as pain is complete resolved, I had a shared decision-making conversation with the patient and her family and we agreed that the patient will be discharged with her schedule cardiology follow-up with very strict return precautions of which all in the room voiced understanding.  I have low suspicion for pulmonary embolism given no shortness of breath or tachycardia, no pleurisy.  Patient then discharged.  Update: After patient discharge, I received a call from Dr. Irish Lack of cardiology stating that they in fact did receive a Zio report that the patient had a short episode of complete heart block.  While in the emergency department, the patient did not have complete heart block but it is possible  that the patient's wave of nausea may be her going into this rhythm.  Per Dr. Irish Lack, his office will reach out to the patient today or tomorrow to establish close EP follow-up for pacemaker evaluation.  I personally called patient's daughter [name] at 6:18 PM on 10/11/2021 to discuss these new findings and update the patient's return precautions which now include any new or worsening waves of nausea, syncope, chest pain or shortness of breath.  Abdominal informed me that she will inform her mother of this conversation and that the patient will be brought back to the emergency department immediately for likely cardiology admission and pacemaker placement if the symptoms are recurrent.   Additional history obtained: -Additional history obtained from daughter -External records from outside source obtained and reviewed including: Chart review including previous notes, labs, imaging, consultation notes   Lab Tests: -I ordered, reviewed, and interpreted labs.   The pertinent results include:   Labs Reviewed  BASIC METABOLIC PANEL - Abnormal; Notable for the following components:      Result Value   Potassium 3.3 (*)    BUN 25 (*)    All other components within normal limits  CBC - Abnormal; Notable for the following components:   Platelets 142 (*)    All other components within normal limits  TROPONIN I (HIGH SENSITIVITY)  TROPONIN I (HIGH SENSITIVITY)      EKG   EKG Interpretation  Date/Time:  Tuesday Oct 11 2021 11:33:52 EDT Ventricular Rate:  79 PR Interval:  322 QRS Duration: 94 QT Interval:  418 QTC  Calculation: 479 R Axis:   -72 Text Interpretation: Sinus rhythm with 1st degree A-V block Left anterior fascicular block Abnormal ECG When compared with ECG of 03-Oct-2021 08:32, PREVIOUS ECG IS PRESENT Confirmed by Parker ([id number]) on 10/11/2021 3:05:16 PM         Imaging Studies ordered: I ordered imaging studies including CXR I independently visualized and interpreted  imaging. I agree with the radiologist interpretation   Medicines ordered and prescription drug management: Meds ordered this encounter  Medications   AND Linked Order Group    alum & mag hydroxide-simeth (MAALOX/MYLANTA) 200-200-20 MG/5ML suspension 30 mL    lidocaine (XYLOCAINE) 2 % viscous mouth solution 15 mL   hydrALAZINE (APRESOLINE) tablet 100 mg   omeprazole (PRILOSEC) 20 MG capsule    Sig: Take 1 capsule (20 mg total) by mouth daily.    Dispense:  30 capsule    Refill:  0    -I have reviewed the patients home medicines and have made adjustments as needed  Critical interventions none  Consultations Obtained: I requested consultation with the cardiologist on call,  and discussed lab and imaging findings as well as pertinent plan - they recommend: OK for scheduled outpt follow up initally, on review of Zio patch, expedited outpatient EP follow-up and strict return precautions to the emergency department if syncope, persistent nausea, chest pain or shortness of breath   Cardiac Monitoring: The patient was maintained on a cardiac monitor.  I personally viewed and interpreted the cardiac monitored which showed an underlying rhythm of: NSR, 2nd degree AV block type I  Social Determinants of Health:  Factors impacting patients care include: none   Reevaluation: After the interventions noted above, I reevaluated the patient and found that they have :improved  Co morbidities that complicate the patient evaluation  Past Medical History:  Diagnosis Date   Allergy    codeine, thiazides   Anxiety    new dx   Arthritis    Breast cancer ([place]) 03/14/12   bx=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,ER/PR=+,upper inner quad   Cancer ([place])    breast   Cataract    DVT (deep venous thrombosis) ([place]) 02/2019   left leg   Dyspnea    Edema    both legs feet and toe, abdomen   Glaucoma    laser treated years ago   HOH (hard of hearing)    Hyperlipemia     Hypertension    Hypothyroidism    Pancreatic cyst    benign   PONV (postoperative nausea and vomiting)    Pulmonary embolism ([place]) 02/2019   bilateral    Radiation 06/11/2012-07/12/2012   17 sessions 4250 cGy, 3 sessions 750 cGy   Vertigo    Wears glasses    Wears partial dentures    partial upper      Dispostion: I considered admission for this patient, and at time of discharge with available information, patient was safe for discharge with outpatient cardiology follow-up.  On later evaluation of the patient's Zio patch, she will require expedited cardiology and EP follow-up with strict return precautions of which the patients daughter voiced understanding of the current plan     Final Clinical Impression(s) / ED Diagnoses Final diagnoses:  Atypical chest pain     @PCDICTATION @    Teressa Lower, MD 10/11/21 [place], [place], MD 10/11/21 1821

## 2021-10-11 NOTE — ED Provider Triage Note (Signed)
Emergency Medicine Provider Triage Evaluation Note  Sarah [name] , a 88 y.o. female  was evaluated in triage.  Pt complains of chest pains.  Of note, patient had a pacemaker put in a week and a half ago due to bradycardia.  Says that the pain is dull and has been consistent over the past few days.  No history of ACS.  Review of Systems  Positive: Chest pain Negative: Shortness of breath, back pain or palpitations  Physical Exam  BP 129/77   Pulse 81   Temp 98.1 F (36.7 C) (Oral)   Resp 20   SpO2 97%  Gen:   Awake, no distress   Resp:  Normal effort  MSK:   Moves extremities without difficulty  Other:  Well-appearing, regular rate and rhythm.  Surgical site without signs of infection  Medical Decision Making  Medically screening exam initiated at 11:47 AM.  Appropriate orders placed.  MARGO LAMA was informed that the remainder of the evaluation will be completed by another provider, this initial triage assessment does not replace that evaluation, and the importance of remaining in the ED until their evaluation is complete.    Patient and her family member were notified that we are unable to access patient's port from triage however we cannot delay lab work for this reason.   Rhae Hammock, PA-C 10/11/21 1148

## 2021-10-11 NOTE — ED Triage Notes (Signed)
Pt has a monitor for low heart rate

## 2021-10-11 NOTE — ED Notes (Signed)
Reviewed discharge instructions with patient and daughter. Follow-up care and medications reviewed. Patient and daughter verbalized understanding. Patient A&Ox4, VSS, and ambulatory with steady gait upon discharge.

## 2021-10-12 ENCOUNTER — Telehealth: Payer: Self-pay | Admitting: Cardiology

## 2021-10-12 NOTE — Telephone Encounter (Signed)
New message   Pt was contacted to schedule ASAP consult with EP for eval for PPM. Pt daughter could not do appt on Monday or Friday, no early AM appts. Stated too many appts between herself and pt and needed to be able to go to work so she is not let go. Pt is scheduled for appt on 06.13 with Dr. Ileana Ladd

## 2021-10-15 NOTE — Progress Notes (Unsigned)
Cardiology Office Note:    Date:  10/26/2021   ID:  [name], DOB July 28, 1932, MRN [id number]  PCP:  Lajean Manes, MD  Cardiologist:  Buford Dresser, MD  Electrophysiologist:  None   Referring MD: Lajean Manes, MD   Chief Complaint: hospital follow-up for 2nd degree AV block type 1 (Wenckebach)  History of Present Illness:    Sarah [name] is a 88 y.o. 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  Patient was first seen by Cardiology in 02/2019 during an admission for acute bilateral PE and acute DVT of left lower extremity. Echo showed LVEF of 60-65% and moderately enlarged RV with mildly reduced systolic function. High-sensitivity troponin peaked at 352 but this was felt to be due to large PE. Last Echo in 04/2021 showed LVEF of 55-60% with no regional wall motion abnormalities and mild LVH, mildly enlarged RV with normal systolic function, severe biatrial enlargement, mild to moderate TR, and borderline dilatation of the ascending aorta measuring 39 mm.   She was not seen by Cardiology again until recent hospitalization. She was admitted from 09/30/2021 to 10/03/2021 after presenting with confusion and being found to have sinus bradycardia with 2nd degree type 1 AV block (Wenckebach) and rates in the 40s. Home Coreg was stopped with improvement in rates but she continue to have intermittent Wenckebach. However, she was asymptomatic with this. Live Zio monitor was placed prior to discharge. She then presented back to the ED on 10/11/2021 for further evaluation of dull ache on the left side of her chest for the last 3 days as well as waves of nausea which she reported she has had for about 1 year. EKG  showed no acute ischemic changes. High-sensitivity troponin negative x2. Patient was given a GI cocktail and symptoms completely resolved. She was noted to have continued Wenckebach on telemetry in the ED. She was ultimately felt to be stable for discharge with close Cardiology follow-up. However, after patient was discharged from the ED, we received a Zio report that patient had a short episodes of complete heart block. Patient was referred to EP.  Patient presents today for follow-up. Here with daughter. She has done well since last ED visit. No recurrent chest pain on Omeprazole. No shortness of breath, orthopnea, PND. She has chronic mild lower extremity edema but this has improved after starting Lasix and using compression stockings about 2 weeks ago. Lightheadedness/dizziness has resolved after stopping Coreg. No syncope. She has finished wearing her Zio monitor but unable to see results yet in Epic.  Past Medical History:  Diagnosis Date   Allergy    codeine, thiazides   Anxiety    new dx   Arthritis    Atrioventricular block, Mobitz type 1, Wenckebach    Breast cancer (HCC) 03/14/2012   bx=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,ER/PR=+,upper inner quad   Cancer (HCC)    breast   Cataract    DVT (deep venous thrombosis) ([place]) 02/2019   left leg   Dyspnea    Edema    both legs feet and toe, abdomen   Glaucoma    laser treated years ago   HOH (hard of hearing)    Hyperlipemia    Hypertension    Hypothyroidism  Pancreatic cyst    benign   PONV (postoperative nausea and vomiting)    Pulmonary embolism ([place]) 02/2019   bilateral    Radiation 06/11/2012-07/12/2012   17 sessions 4250 cGy, 3 sessions 750 cGy   Vertigo    Wears glasses    Wears partial dentures    partial upper    Past Surgical History:  Procedure Laterality Date   ABDOMINAL HYSTERECTOMY  1966   1/2 ovary left in    [place]   lumpectomy-lt   CATARACT EXTRACTION     b/l    COLONOSCOPY     EXCISION MASS NECK Left 03/17/2019    EXCISION MASS NECK (Left Neck)   EXCISION MASS NECK Left 03/17/2019   Procedure: EXCISION MASS NECK;  Surgeon: Helayne Seminole, MD;  Location: [facility];  Service: ENT;  Laterality: Left;   EYE SURGERY Bilateral    bilateral cataract removal   IR IMAGING GUIDED PORT INSERTION  04/23/2019   JOINT REPLACEMENT  2013   rt total knee   JOINT REPLACEMENT  1995   lt total knee   PARTIAL MASTECTOMY WITH NEEDLE LOCALIZATION  04/09/2012   Procedure: PARTIAL MASTECTOMY WITH NEEDLE LOCALIZATION;  Surgeon: Adin Hector, MD;  Location: [facility];  Service: General;  Laterality: Right;   SKIN BIOPSY Left 03/17/2019   LEFT THIGH   SKIN BIOPSY Left 03/17/2019   Procedure: Skin Biopsy Left Thigh;  Surgeon: Helayne Seminole, MD;  [facility];  Service: ENT;  Laterality: Left;   TONSILLECTOMY     TOTAL KNEE ARTHROPLASTY  08/16/2011   Procedure: TOTAL KNEE ARTHROPLASTY;  Surgeon: Ninetta Lights, MD;  Location: [facility];  Service: Orthopedics;  Laterality: Right;    Current Medications: Current Meds  Medication Sig   acetaminophen (TYLENOL) 500 MG tablet Take 1,000 mg by mouth every 6 (six) hours as needed for moderate pain.   atorvastatin (LIPITOR) 20 MG tablet Take 1 tablet (20 mg total) by mouth daily. (Patient taking differently: Take 20 mg by mouth every evening.)   B Complex-C (SUPER B COMPLEX PO) Take 1 tablet by mouth daily.   Cholecalciferol (VITAMIN D) 50 MCG (2000 UT) tablet Take 2,000 Units by mouth 2 (two) times daily.   citalopram (CELEXA) 10 MG tablet Take 10 mg by mouth daily.   ELIQUIS 2.5 MG TABS tablet TAKE 1 TABLET TWICE A DAY (Patient taking differently: Take 2.5 mg by mouth 2 (two) times daily.)   furosemide (LASIX) 20 MG tablet Take 20 mg by mouth See admin instructions. Take 20 mg daily and an additional 20 mg on the days of swelling or increased weight gain of 3 lb over night or 5 lb in 1 week.    levothyroxine (SYNTHROID) 25 MCG tablet Take 25 mcg by mouth daily before breakfast.    lidocaine-prilocaine (EMLA) cream Apply to affected area once (Patient taking differently: Apply 1 application. topically See admin instructions. Apply to port site prior to port access)   Multiple Vitamins-Minerals (PRESERVISION AREDS PO) Take 1 capsule by mouth 2 (two) times daily.    omeprazole (PRILOSEC) 20 MG capsule Take 1 capsule (20 mg total) by mouth daily.   ondansetron (ZOFRAN) 8 MG tablet Take 1 tablet (8 mg total) by mouth 2 (two) times daily as needed. Start on the third day after chemotherapy. (Patient taking differently: Take 8 mg by mouth 2 (two) times daily as needed for nausea or vomiting. Start on the third day after  chemotherapy.)   Polyethyl Glycol-Propyl Glycol (SYSTANE) 0.4-0.3 % GEL ophthalmic gel Place 1 application into both eyes daily as needed (dry eyes).   sodium chloride (MURO 128) 5 % ophthalmic ointment Place 1 application into both eyes at bedtime.   zolpidem (AMBIEN) 5 MG tablet Take 5 mg by mouth at bedtime.   [DISCONTINUED] amLODipine (NORVASC) 5 MG tablet Take 1 tablet (5 mg total) by mouth daily. Started 5 days ago   [DISCONTINUED] hydrALAZINE (APRESOLINE) 100 MG tablet Take 1 tablet (100 mg total) by mouth 3 (three) times daily.     Allergies:   Alendronate sodium, Amoxicillin-pot clavulanate, Codeine, Lisinopril, Septra [sulfamethoxazole-trimethoprim], Thiazide-type diuretics, and Latex   Social History   Socioeconomic History   Marital status: Widowed    Spouse name: Not on file   Number of children: 2   Years of education: Not on file   Highest education level: Not on file  Occupational History   Occupation: Retired    Comment: Community education officer   Tobacco Use   Smoking status: Never   Smokeless tobacco: Never  Vaping Use   Vaping Use: Never used  Substance and Sexual Activity   Alcohol use: No   Drug use: No   Sexual activity: Not Currently  Other Topics  Concern   Not on file  Social History Narrative   Not on file   Social Determinants of Health   Financial Resource Strain: Not on file  Food Insecurity: Not on file  Transportation Needs: Not on file  Physical Activity: Not on file  Stress: Not on file  Social Connections: Not on file     Family History: The patient's family history includes Cancer in her maternal aunt; Heart disease in her father.  ROS:   Please see the history of present illness.     EKGs/Labs/Other Studies Reviewed:    The following studies were reviewed today:  Echocardiogram 05/16/2021: Impressions:  1. Left ventricular ejection fraction, by estimation, is 55 to 60%. The  left ventricle has normal function. The left ventricle has no regional  wall motion abnormalities. There is mild left ventricular hypertrophy.  Left ventricular diastolic parameters  are indeterminate.   2. Right ventricular systolic function is normal. The right ventricular  size is mildly enlarged. There is normal pulmonary artery systolic  pressure.   3. Left atrial size was severely dilated.   4. Right atrial size was severely dilated.   5. The mitral valve is normal in structure. Trivial mitral valve  regurgitation. No evidence of mitral stenosis.   6. Tricuspid valve regurgitation is mild to moderate.   7. The aortic valve is normal in structure. Aortic valve regurgitation is  trivial. No aortic stenosis is present.   8. Aortic dilatation noted. There is borderline dilatation of the  ascending aorta, measuring 39 mm.   9. The inferior vena cava is dilated in size with >50% respiratory  variability, suggesting right atrial pressure of 8 mmHg.  _______________  2 Week Zio Monitor 09/2021: Results pending.  EKG:  EKG ordered today. EKG personally reviewed and demonstrates Normal sinus rhythm, rate 6 5bpm, with possible Wenckebach vs blocked PACs. LVH. No acute ST/T changes. Left axis deviation. QtTc 428 ms.  Recent  Labs: 10/01/2021: TSH 1.917 10/03/2021: Magnesium 2.1 10/05/2021: ALT 24 10/11/2021: BUN 25; Creatinine, Ser 0.69; Hemoglobin 14.0; Platelets 142; Potassium 3.3; Sodium 142  Recent Lipid Panel    Component Value Date/Time   CHOL 189 05/16/2021 0805   TRIG 75 05/16/2021 0805  HDL 76 05/16/2021 0805   CHOLHDL 2.5 05/16/2021 0805   VLDL 15 05/16/2021 0805   LDLCALC 98 05/16/2021 0805    Physical Exam:    Vital Signs: BP 114/60   Pulse 65   Ht 5\' 4"  (1.626 m)   Wt 107 lb 9.6 oz (48.8 kg)   SpO2 96%   BMI 18.47 kg/m     Wt Readings from Last 3 Encounters:  10/26/21 107 lb 9.6 oz (48.8 kg)  10/05/21 109 lb 14.4 oz (49.9 kg)  10/02/21 109 lb 12.8 oz (49.8 kg)     General: 88 y.o. thin Caucasian female in no acute distress. HEENT: Normocephalic and atraumatic. Sclera clear. EOMs intact. Neck: Supple. No JVD. Heart: Irregular rhythm with normal rate. Distinct S1 and S2. No murmurs, gallops, or rubs.  Lungs: No increased work of breathing. Clear to ausculation bilaterally. No wheezes, rhonchi, or rales.  Abdomen: Soft, non-distended, and non-tender to palpation.  Extremities: Trace lower extremity bilaterally.   Skin: Warm and dry. Neuro: Alert and oriented x3. No focal deficits. Psych: Normal affect. Responds appropriately.   Assessment:    1. Atrioventricular block, Mobitz type 1, Wenckebach   2. Heart block AV complete ([place])   3. History of chest pain   4. Primary hypertension   5. Hyperlipidemia, unspecified hyperlipidemia type   6. History of DVT (deep vein thrombosis)   7. History of pulmonary embolus (PE)   8. Hypokalemia     Plan:    2nd Degree AV Block Type 1 (Wenckebach) Complete Heart Block Patient recently admitted with confusion and incidentally found to have Wenckebach. Home Coreg was stopped with improvement in rate but continue intermittent Wenckebach. Live Zio monitor was placed prior to discharge. This has been completed but unable to see results yet.  However,  per note on 10/11/2021 , there were episodes of complete heart block. - EKG today shows possible Wenckebach vs blocked PACs. Reviewed with Dr. Claiborne Billings (DOD). - Asymptomatic.  - Continue to avoid AV nodal agents.  - Patient is scheduled to see EP (Dr. Curt Bears) next week.   Chest Pain Patient recently seen in the ED for atypical chest pain that improved with GI cocktail. EKG showed no acute ischemic changes and troponin was negative.  - No recurrent chest pain since being started on Omeprazole. - No additional work-up necessary at this time. If she has recurrent pain, can consider nuclear stress test.  Hypertension BP well controlled. - Continue Amlodipine 5mg  daily and Hydralazine 100mg  three times daily. Will refill these. Also on Lasix 40mg  M/W/F and 20mg  T/TH/Sat/Sun for lower extremity edema. For simplicity, will decrease Lasix to 20mg  daily and then patient can take an extra dose as needed for weight gain or worsening lower extremity edema.  Hyperlipidemia LDL 98 in 04/2021. LDL goal <70 given history of stroke. - Continue Lipitor 20mg  daily.  - Did not discuss today. At follow-up, can discuss increasing Lipitor given LDL not at goal and history of stroke.  History of PE/DVT Diagnosed in 02/2019. - Remains on Eliquis 2.5mg  twice daily.  Hypokalemia Potassium mildly low at 3.3. on 10/11/2021. - Will recheck BMET today.  Disposition: Follow up in 4-6 months.   Medication Adjustments/Labs and Tests Ordered: Current medicines are reviewed at length with the patient today.  Concerns regarding medicines are outlined above.  Orders Placed This Encounter  Procedures   Basic metabolic panel   EKG [id number]   Meds ordered this encounter  Medications   amLODipine (NORVASC) 5  MG tablet    Sig: Take 1 tablet (5 mg total) by mouth daily. Started 5 days ago    Dispense:  30 tablet    Refill:  11   hydrALAZINE (APRESOLINE) 100 MG tablet    Sig: Take 1 tablet 3 times daily     Dispense:  90 tablet    Refill:  3    Patient Instructions  Medication Instructions:  Lasix 20 mg daily. Take an additional 20 mg of Lasix on the days of swelling and weight gain 3 lb over night or 5 lb in 1 week.  *If you need a refill on your cardiac medications before your next appointment, please call your pharmacy*   Lab Work: Your physician recommends that you complete labs today BMET  If you have labs (blood work) drawn today and your tests are completely normal, you will receive your results only by: MyChart Message (if you have MyChart) OR A paper copy in the mail If you have any lab test that is abnormal or we need to change your treatment, we will call you to review the results.   Testing/Procedures: NONE ordered at this time of appointment     Follow-Up: At [facility], you and your health needs are our priority.  As part of our continuing mission to provide you with exceptional heart care, we have created designated Provider Care Teams.  These Care Teams include your primary Cardiologist (physician) and Advanced Practice Providers (APPs -  Physician Assistants and Nurse Practitioners) who all work together to provide you with the care you need, when you need it.  We recommend signing up for the patient portal called "MyChart".  Sign up information is provided on this After Visit Summary.  MyChart is used to connect with patients for Virtual Visits (Telemedicine).  Patients are able to view lab/test results, encounter notes, upcoming appointments, etc.  Non-urgent messages can be sent to your provider as well.   To learn more about what you can do with MyChart, go to [email or web address].    Your next appointment:   4-6 month(s)  The format for your next appointment:   In Person  Provider:   Sande Rives, PA-C        Other Instructions   Important Information About Sugar         Signed, Darreld Mclean, PA-C  10/26/2021 12:55 PM    [place]

## 2021-10-18 ENCOUNTER — Encounter: Payer: Self-pay | Admitting: Podiatry

## 2021-10-18 ENCOUNTER — Ambulatory Visit (INDEPENDENT_AMBULATORY_CARE_PROVIDER_SITE_OTHER): Payer: Medicare Other | Admitting: Podiatry

## 2021-10-18 DIAGNOSIS — M79674 Pain in right toe(s): Secondary | ICD-10-CM | POA: Diagnosis not present

## 2021-10-18 DIAGNOSIS — M79675 Pain in left toe(s): Secondary | ICD-10-CM | POA: Diagnosis not present

## 2021-10-18 DIAGNOSIS — D689 Coagulation defect, unspecified: Secondary | ICD-10-CM

## 2021-10-18 DIAGNOSIS — M205X2 Other deformities of toe(s) (acquired), left foot: Secondary | ICD-10-CM

## 2021-10-18 DIAGNOSIS — L84 Corns and callosities: Secondary | ICD-10-CM

## 2021-10-18 DIAGNOSIS — B351 Tinea unguium: Secondary | ICD-10-CM | POA: Diagnosis not present

## 2021-10-18 NOTE — Progress Notes (Signed)
This patient returns to my office for at risk foot care.  This patient requires this care by a professional since this patient will be at risk due to having neuropathy  and coagulation defect.  Patient is taking eliquis.This patient is unable to cut nails herself since the patient cannot reach her nails.These nails are painful walking and wearing shoes.  She has painful corn on the tip of her third toe left foot. She presents to the office with her daughter.This patient presents for at risk foot care today.  General Appearance  Alert, conversant and in no acute stress.  Vascular  Dorsalis pedis and posterior tibial  pulses are  weakly palpable  bilaterally.  Capillary return is within normal limits  bilaterally. Temperature is within normal limits  bilaterally.Digital hair  noted.    Neurologic  Senn-Weinstein monofilament wire test within normal limits  bilaterally. Muscle power within normal limits bilaterally.  Nails Thick disfigured discolored nails with subungual debris  from hallux to fifth toes bilaterally. No evidence of bacterial infection or drainage bilaterally.  Orthopedic  No limitations of motion  feet .  No crepitus or effusions noted.  Hammer toes  B/L.  Skin  normotropic skin with no porokeratosis noted bilaterally.  No signs of infections or ulcers noted.   Distal clavi 3rd toe left foot.     Onychomycosis  Pain in right toes  Pain in left toes  Consent was obtained for treatment procedures.   Mechanical debridement of nails 1-5  bilaterally performed with a nail nipper.  Filed with dremel without incident. Debridement of clavi with # 15 blade.  Padding dispensed.   Return office visit   9 weeks                  Told patient to return for periodic foot care and evaluation due to potential at risk complications.   Gardiner Barefoot DPM

## 2021-10-25 ENCOUNTER — Encounter: Payer: Self-pay | Admitting: Student

## 2021-10-25 DIAGNOSIS — Z1231 Encounter for screening mammogram for malignant neoplasm of breast: Secondary | ICD-10-CM | POA: Diagnosis not present

## 2021-10-26 ENCOUNTER — Encounter: Payer: Self-pay | Admitting: Student

## 2021-10-26 ENCOUNTER — Ambulatory Visit (INDEPENDENT_AMBULATORY_CARE_PROVIDER_SITE_OTHER): Payer: Medicare Other | Admitting: Student

## 2021-10-26 ENCOUNTER — Other Ambulatory Visit: Payer: Self-pay | Admitting: Oncology

## 2021-10-26 VITALS — BP 114/60 | HR 65 | Ht 64.0 in | Wt 107.6 lb

## 2021-10-26 DIAGNOSIS — I441 Atrioventricular block, second degree: Secondary | ICD-10-CM | POA: Diagnosis not present

## 2021-10-26 DIAGNOSIS — I10 Essential (primary) hypertension: Secondary | ICD-10-CM

## 2021-10-26 DIAGNOSIS — E876 Hypokalemia: Secondary | ICD-10-CM

## 2021-10-26 DIAGNOSIS — E785 Hyperlipidemia, unspecified: Secondary | ICD-10-CM | POA: Diagnosis not present

## 2021-10-26 DIAGNOSIS — Z87898 Personal history of other specified conditions: Secondary | ICD-10-CM | POA: Diagnosis not present

## 2021-10-26 DIAGNOSIS — Z86718 Personal history of other venous thrombosis and embolism: Secondary | ICD-10-CM | POA: Diagnosis not present

## 2021-10-26 DIAGNOSIS — I442 Atrioventricular block, complete: Secondary | ICD-10-CM

## 2021-10-26 DIAGNOSIS — Z86711 Personal history of pulmonary embolism: Secondary | ICD-10-CM

## 2021-10-26 LAB — BASIC METABOLIC PANEL
BUN/Creatinine Ratio: 28 (ref 12–28)
BUN: 19 mg/dL (ref 8–27)
CO2: 26 mmol/L (ref 20–29)
Calcium: 9.9 mg/dL (ref 8.7–10.3)
Chloride: 103 mmol/L (ref 96–106)
Creatinine, Ser: 0.69 mg/dL (ref 0.57–1.00)
Glucose: 81 mg/dL (ref 70–99)
Potassium: 3.6 mmol/L (ref 3.5–5.2)
Sodium: 145 mmol/L — ABNORMAL HIGH (ref 134–144)
eGFR: 84 mL/min/{1.73_m2} (ref >59)

## 2021-10-26 MED ORDER — AMLODIPINE BESYLATE 5 MG PO TABS: 5.0000 mg | ORAL_TABLET | Freq: Every day | ORAL | 11 refills | Status: DC

## 2021-10-26 MED ORDER — HYDRALAZINE HCL 100 MG PO TABS: ORAL_TABLET | ORAL | 3 refills | Status: DC

## 2021-10-26 NOTE — Patient Instructions (Addendum)
Medication Instructions:  Lasix 20 mg daily. Take an additional 20 mg of Lasix on the days of swelling and weight gain 3 lb over night or 5 lb in 1 week.  *If you need a refill on your cardiac medications before your next appointment, please call your pharmacy*   Lab Work: Your physician recommends that you complete labs today BMET  If you have labs (blood work) drawn today and your tests are completely normal, you will receive your results only by: MyChart Message (if you have MyChart) OR A paper copy in the mail If you have any lab test that is abnormal or we need to change your treatment, we will call you to review the results.   Testing/Procedures: NONE ordered at this time of appointment     Follow-Up: At [facility], you and your health needs are our priority.  As part of our continuing mission to provide you with exceptional heart care, we have created designated Provider Care Teams.  These Care Teams include your primary Cardiologist (physician) and Advanced Practice Providers (APPs -  Physician Assistants and Nurse Practitioners) who all work together to provide you with the care you need, when you need it.  We recommend signing up for the patient portal called "MyChart".  Sign up information is provided on this After Visit Summary.  MyChart is used to connect with patients for Virtual Visits (Telemedicine).  Patients are able to view lab/test results, encounter notes, upcoming appointments, etc.  Non-urgent messages can be sent to your provider as well.   To learn more about what you can do with MyChart, go to [email or web address].    Your next appointment:   4-6 month(s)  The format for your next appointment:   In Person  Provider:   Sande Rives, PA-C        Other Instructions   Important Information About Sugar

## 2021-10-27 ENCOUNTER — Telehealth: Payer: Self-pay | Admitting: Cardiology

## 2021-10-27 NOTE — Telephone Encounter (Signed)
Sarah [name] from [employer] calling to report the following findings. Per chart review, pt was seen in the office by [facility]  yesterday 6/7 and is being referred to EP  5/17 -7 am (high grade av block 2.1 sec) 5/17 -7:22 am (high grade av block 2.1 sec) 5/28- 3:51 am (high grade av block 2.2 sec)

## 2021-10-27 NOTE — Telephone Encounter (Signed)
Calling with additional findings for pt. Please advise

## 2021-10-31 ENCOUNTER — Emergency Department (HOSPITAL_COMMUNITY): Payer: Medicare Other

## 2021-10-31 ENCOUNTER — Encounter (HOSPITAL_COMMUNITY): Payer: Self-pay | Admitting: *Deleted

## 2021-10-31 ENCOUNTER — Other Ambulatory Visit: Payer: Self-pay

## 2021-10-31 ENCOUNTER — Inpatient Hospital Stay (HOSPITAL_COMMUNITY)
Admission: EM | Admit: 2021-10-31 | Discharge: 2021-11-04 | DRG: 244 | Disposition: A | Discharge status: Skilled Nursing Facility | Payer: Medicare Other | Attending: Family Medicine | Admitting: Family Medicine

## 2021-10-31 DIAGNOSIS — F5104 Psychophysiologic insomnia: Secondary | ICD-10-CM | POA: Diagnosis not present

## 2021-10-31 DIAGNOSIS — Z972 Presence of dental prosthetic device (complete) (partial): Secondary | ICD-10-CM | POA: Diagnosis not present

## 2021-10-31 DIAGNOSIS — Z90710 Acquired absence of both cervix and uterus: Secondary | ICD-10-CM

## 2021-10-31 DIAGNOSIS — Z79899 Other long term (current) drug therapy: Secondary | ICD-10-CM | POA: Diagnosis not present

## 2021-10-31 DIAGNOSIS — E039 Hypothyroidism, unspecified: Secondary | ICD-10-CM | POA: Diagnosis present

## 2021-10-31 DIAGNOSIS — I10 Essential (primary) hypertension: Secondary | ICD-10-CM | POA: Diagnosis present

## 2021-10-31 DIAGNOSIS — I4430 Unspecified atrioventricular block: Secondary | ICD-10-CM | POA: Diagnosis not present

## 2021-10-31 DIAGNOSIS — Z86711 Personal history of pulmonary embolism: Secondary | ICD-10-CM

## 2021-10-31 DIAGNOSIS — E876 Hypokalemia: Secondary | ICD-10-CM | POA: Diagnosis present

## 2021-10-31 DIAGNOSIS — G4700 Insomnia, unspecified: Secondary | ICD-10-CM

## 2021-10-31 DIAGNOSIS — Z8673 Personal history of transient ischemic attack (TIA), and cerebral infarction without residual deficits: Secondary | ICD-10-CM | POA: Diagnosis not present

## 2021-10-31 DIAGNOSIS — H9190 Unspecified hearing loss, unspecified ear: Secondary | ICD-10-CM | POA: Diagnosis present

## 2021-10-31 DIAGNOSIS — Z96651 Presence of right artificial knee joint: Secondary | ICD-10-CM | POA: Diagnosis present

## 2021-10-31 DIAGNOSIS — R001 Bradycardia, unspecified: Secondary | ICD-10-CM | POA: Diagnosis not present

## 2021-10-31 DIAGNOSIS — I442 Atrioventricular block, complete: Principal | ICD-10-CM | POA: Diagnosis present

## 2021-10-31 DIAGNOSIS — Z950 Presence of cardiac pacemaker: Secondary | ICD-10-CM | POA: Diagnosis not present

## 2021-10-31 DIAGNOSIS — M6281 Muscle weakness (generalized): Secondary | ICD-10-CM | POA: Diagnosis not present

## 2021-10-31 DIAGNOSIS — Z9011 Acquired absence of right breast and nipple: Secondary | ICD-10-CM | POA: Diagnosis not present

## 2021-10-31 DIAGNOSIS — R531 Weakness: Secondary | ICD-10-CM | POA: Diagnosis not present

## 2021-10-31 DIAGNOSIS — Z880 Allergy status to penicillin: Secondary | ICD-10-CM

## 2021-10-31 DIAGNOSIS — Z7901 Long term (current) use of anticoagulants: Secondary | ICD-10-CM

## 2021-10-31 DIAGNOSIS — Z8572 Personal history of non-Hodgkin lymphomas: Secondary | ICD-10-CM | POA: Diagnosis not present

## 2021-10-31 DIAGNOSIS — M898X6 Other specified disorders of bone, lower leg: Secondary | ICD-10-CM

## 2021-10-31 DIAGNOSIS — Z8249 Family history of ischemic heart disease and other diseases of the circulatory system: Secondary | ICD-10-CM

## 2021-10-31 DIAGNOSIS — Z888 Allergy status to other drugs, medicaments and biological substances status: Secondary | ICD-10-CM

## 2021-10-31 DIAGNOSIS — Z66 Do not resuscitate: Secondary | ICD-10-CM | POA: Diagnosis not present

## 2021-10-31 DIAGNOSIS — Z853 Personal history of malignant neoplasm of breast: Secondary | ICD-10-CM | POA: Diagnosis not present

## 2021-10-31 DIAGNOSIS — Z881 Allergy status to other antibiotic agents status: Secondary | ICD-10-CM

## 2021-10-31 DIAGNOSIS — Z885 Allergy status to narcotic agent status: Secondary | ICD-10-CM | POA: Diagnosis not present

## 2021-10-31 DIAGNOSIS — C8440 Peripheral T-cell lymphoma, not classified, unspecified site: Secondary | ICD-10-CM | POA: Diagnosis present

## 2021-10-31 DIAGNOSIS — K219 Gastro-esophageal reflux disease without esophagitis: Secondary | ICD-10-CM | POA: Diagnosis present

## 2021-10-31 DIAGNOSIS — I441 Atrioventricular block, second degree: Secondary | ICD-10-CM | POA: Diagnosis not present

## 2021-10-31 DIAGNOSIS — Z79890 Hormone replacement therapy (postmenopausal): Secondary | ICD-10-CM

## 2021-10-31 DIAGNOSIS — Z86718 Personal history of other venous thrombosis and embolism: Secondary | ICD-10-CM

## 2021-10-31 DIAGNOSIS — E785 Hyperlipidemia, unspecified: Secondary | ICD-10-CM | POA: Diagnosis not present

## 2021-10-31 DIAGNOSIS — Z91040 Latex allergy status: Secondary | ICD-10-CM

## 2021-10-31 DIAGNOSIS — R009 Unspecified abnormalities of heart beat: Secondary | ICD-10-CM | POA: Diagnosis not present

## 2021-10-31 DIAGNOSIS — F32A Depression, unspecified: Secondary | ICD-10-CM | POA: Diagnosis not present

## 2021-10-31 DIAGNOSIS — R2681 Unsteadiness on feet: Secondary | ICD-10-CM | POA: Diagnosis not present

## 2021-10-31 DIAGNOSIS — R278 Other lack of coordination: Secondary | ICD-10-CM | POA: Diagnosis not present

## 2021-10-31 HISTORY — DX: Cardiac arrhythmia, unspecified: I49.9

## 2021-10-31 LAB — BASIC METABOLIC PANEL
Anion gap: 9 (ref 5–15)
BUN: 22 mg/dL (ref 8–23)
CO2: 27 mmol/L (ref 22–32)
Calcium: 9.3 mg/dL (ref 8.9–10.3)
Chloride: 101 mmol/L (ref 98–111)
Creatinine, Ser: 0.69 mg/dL (ref 0.44–1.00)
GFR, Estimated: >60 mL/min (ref >60)
Glucose, Bld: 92 mg/dL (ref 70–99)
Potassium: 3.4 mmol/L — ABNORMAL LOW (ref 3.5–5.1)
Sodium: 137 mmol/L (ref 135–145)

## 2021-10-31 LAB — CBC
HCT: 39.6 % (ref 36.0–46.0)
Hemoglobin: 12.8 g/dL (ref 12.0–15.0)
MCH: 30.3 pg (ref 26.0–34.0)
MCHC: 32.3 g/dL (ref 30.0–36.0)
MCV: 93.8 fL (ref 80.0–100.0)
Platelets: 136 10*3/uL — ABNORMAL LOW (ref 150–400)
RBC: 4.22 MIL/uL (ref 3.87–5.11)
RDW: 13.9 % (ref 11.5–15.5)
WBC: 3.6 10*3/uL — ABNORMAL LOW (ref 4.0–10.5)
nRBC: 0 % (ref 0.0–0.2)

## 2021-10-31 LAB — URINALYSIS, ROUTINE W REFLEX MICROSCOPIC
Bilirubin Urine: NEGATIVE
Glucose, UA: NEGATIVE mg/dL
Hgb urine dipstick: NEGATIVE
Ketones, ur: NEGATIVE mg/dL
Leukocytes,Ua: NEGATIVE
Nitrite: NEGATIVE
Protein, ur: NEGATIVE mg/dL
Specific Gravity, Urine: 1.01 (ref 1.005–1.030)
pH: 7 (ref 5.0–8.0)

## 2021-10-31 LAB — TROPONIN I (HIGH SENSITIVITY)
Troponin I (High Sensitivity): 15 ng/L (ref <18)
Troponin I (High Sensitivity): 14 ng/L (ref <18)

## 2021-10-31 LAB — CBG MONITORING, ED
Glucose-Capillary: 136 mg/dL — ABNORMAL HIGH (ref 70–99)
Glucose-Capillary: 92 mg/dL (ref 70–99)

## 2021-10-31 MED ORDER — ACETAMINOPHEN 650 MG RE SUPP: 650.0000 mg | Freq: Four times a day (QID) | RECTAL | Status: DC | PRN

## 2021-10-31 MED ORDER — PANTOPRAZOLE SODIUM 40 MG PO TBEC
40.0000 mg | DELAYED_RELEASE_TABLET | Freq: Every day | ORAL | Status: DC
  Administered 2021-11-02 – 2021-11-04 (×3): 40 mg via ORAL
  Filled 2021-10-31 (×3): qty 1

## 2021-10-31 MED ORDER — APIXABAN 2.5 MG PO TABS
2.5000 mg | ORAL_TABLET | Freq: Two times a day (BID) | ORAL | Status: DC
  Administered 2021-10-31: 2.5 mg via ORAL
  Filled 2021-10-31: qty 1

## 2021-10-31 MED ORDER — ACETAMINOPHEN 325 MG PO TABS
650.0000 mg | ORAL_TABLET | Freq: Four times a day (QID) | ORAL | Status: DC | PRN
  Administered 2021-11-01: 650 mg via ORAL
  Filled 2021-10-31: qty 2

## 2021-10-31 MED ORDER — SODIUM CHLORIDE 0.9 % IV BOLUS
500.0000 mL | Freq: Once | INTRAVENOUS | Status: AC
  Administered 2021-10-31: 500 mL via INTRAVENOUS

## 2021-10-31 MED ORDER — CITALOPRAM HYDROBROMIDE 10 MG PO TABS
10.0000 mg | ORAL_TABLET | Freq: Every day | ORAL | Status: DC
  Administered 2021-11-02 – 2021-11-04 (×3): 10 mg via ORAL
  Filled 2021-10-31 (×3): qty 1

## 2021-10-31 MED ORDER — ZOLPIDEM TARTRATE 5 MG PO TABS
5.0000 mg | ORAL_TABLET | Freq: Every day | ORAL | Status: DC
  Administered 2021-10-31 – 2021-11-03 (×4): 5 mg via ORAL
  Filled 2021-10-31 (×4): qty 1

## 2021-10-31 MED ORDER — LEVOTHYROXINE SODIUM 25 MCG PO TABS
25.0000 ug | ORAL_TABLET | Freq: Every day | ORAL | Status: DC
  Administered 2021-11-02 – 2021-11-04 (×3): 25 ug via ORAL
  Filled 2021-10-31 (×3): qty 1

## 2021-10-31 MED ORDER — ATORVASTATIN CALCIUM 10 MG PO TABS
20.0000 mg | ORAL_TABLET | Freq: Every evening | ORAL | Status: DC
  Administered 2021-10-31 – 2021-11-03 (×3): 20 mg via ORAL
  Filled 2021-10-31 (×3): qty 2

## 2021-10-31 NOTE — ED Notes (Signed)
Patient ambulated to the bathroom with minimal help

## 2021-10-31 NOTE — H&P (Signed)
History and Physical    CARMELINE KOWAL HTD:[id number] DOB: [birth year] DOA: 10/31/2021  PCP: Lajean Manes, MD  Chief Complaint: Generalized weakness  HPI: Sarah [name] is a 88 y.o. female with medical history significant of second-degree AV block type I (Wenckebach), PE/DVT on Eliquis, stroke, hypertension, hyperlipidemia, hypothyroidism, history of breast cancer in 2013, angioimmunoblastic T-cell lymphoma, chronic thrombocytopenia. Recently had ZIO monitor placed and per cardiology visit note from 6/7 there were episodes of complete heart block.  No longer on any AV nodal blocking agents.  She was scheduled to see EP this week.    Patient presents to the ED today via EMS complaining of generalized weakness, fatigue, and nausea.  Afebrile.  Bradycardic with heart rate in the 30s to 60s but not hypotensive.  Labs showing WBC 3.6, hemoglobin 12.8, platelet count 136k (stable).  High-sensitivity troponin negative x2.  UA without signs of infection.  Chest x-ray showing cardiomegaly and no active disease.  EKG showing second-degree AV block type I (Wenckebach). Patient was given a 500 cc normal saline bolus.  ED physician discussed the case with Dr. Margaretann Loveless with cardiology, EP team will see the patient in the morning.  Patient reports 2-day history of generalized weakness and fatigue.  Reports intermittent nausea but no episodes of vomiting.  Reports being seen by cardiology last week for heart block and is supposed to see EP tomorrow.  She is no longer on Coreg.  Denies dizziness or any episodes of syncope.  Denies fevers, chills, cough, shortness of breath, chest pain, abdominal pain, diarrhea, constipation, or dysuria.  Review of Systems:  Review of Systems  All other systems reviewed and are negative.   Past Medical History:  Diagnosis Date   Allergy    codeine, thiazides   Anxiety    new dx   Arrhythmia    Arthritis    Atrioventricular block, Mobitz type 1, Wenckebach    Breast  cancer ([facility]) 03/14/2012   bx=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,ER/PR=+,upper inner quad   Cancer ([facility])    breast   Cataract    DVT (deep venous thrombosis) ([place]) 02/2019   left leg   Dyspnea    Edema    both legs feet and toe, abdomen   Glaucoma    laser treated years ago   HOH (hard of hearing)    Hyperlipemia    Hypertension    Hypothyroidism    Pancreatic cyst    benign   PONV (postoperative nausea and vomiting)    Pulmonary embolism ([place]) 02/2019   bilateral    Radiation 06/11/2012-07/12/2012   17 sessions 4250 cGy, 3 sessions 750 cGy   Vertigo    Wears glasses    Wears partial dentures    partial upper    Past Surgical History:  Procedure Laterality Date   ABDOMINAL HYSTERECTOMY  1966   1/2 ovary left in    [place]   lumpectomy-lt   CATARACT EXTRACTION     b/l   COLONOSCOPY     EXCISION MASS NECK Left 03/17/2019    EXCISION MASS NECK (Left Neck)   EXCISION MASS NECK Left 03/17/2019   Procedure: EXCISION MASS NECK;  Surgeon: Helayne Seminole, MD;  Location: [facility];  Service: ENT;  Laterality: Left;   EYE SURGERY Bilateral    bilateral cataract removal   IR IMAGING GUIDED PORT INSERTION  04/23/2019   JOINT REPLACEMENT  2013   rt total knee   JOINT REPLACEMENT  1995  lt total knee   PARTIAL MASTECTOMY WITH NEEDLE LOCALIZATION  04/09/2012   Procedure: PARTIAL MASTECTOMY WITH NEEDLE LOCALIZATION;  Surgeon: Adin Hector, MD;  Location: [facility];  Service: General;  Laterality: Right;   SKIN BIOPSY Left 03/17/2019   LEFT THIGH   SKIN BIOPSY Left 03/17/2019   Procedure: Skin Biopsy Left Thigh;  Surgeon: Helayne Seminole, MD;  Location: [facility];  Service: ENT;  Laterality: Left;   TONSILLECTOMY     TOTAL KNEE ARTHROPLASTY  08/16/2011   Procedure: TOTAL KNEE ARTHROPLASTY;  Surgeon: Ninetta Lights, MD;  Location: [facility];  Service: Orthopedics;  Laterality: Right;     reports that she has never smoked. She  has never used smokeless tobacco. She reports that she does not drink alcohol and does not use drugs.  Allergies  Allergen Reactions   Alendronate Sodium Other (See Comments)    UNK reaction   Amoxicillin-Pot Clavulanate Other (See Comments)    UNK reaction   Codeine Other (See Comments)    Reaction not recalled   Lisinopril Other (See Comments)    UNK reaction   Septra [Sulfamethoxazole-Trimethoprim] Other (See Comments)   Thiazide-Type Diuretics Other (See Comments)    Blurry vision?? (patient stated she has macular degeneration)   Latex Rash    Family History  Problem Relation Age of Onset   Heart disease Father    Cancer Maternal Aunt        stomach    Prior to Admission medications   Medication Sig Start Date End Date Taking? Authorizing Provider  acetaminophen (TYLENOL) 500 MG tablet Take 1,000 mg by mouth every 6 (six) hours as needed for moderate pain.    [provider]  amLODipine (NORVASC) 5 MG tablet Take 1 tablet (5 mg total) by mouth daily. Started 5 days ago 10/26/21   Sande Rives E, PA-C  atorvastatin (LIPITOR) 20 MG tablet Take 1 tablet (20 mg total) by mouth daily. Patient taking differently: Take 20 mg by mouth every evening. 05/18/21   Ghimire, Henreitta Leber, MD  B Complex-C (SUPER B COMPLEX PO) Take 1 tablet by mouth daily.    [provider]  Cholecalciferol (VITAMIN D) 50 MCG (2000 UT) tablet Take 2,000 Units by mouth 2 (two) times daily.    [provider]  citalopram (CELEXA) 10 MG tablet Take 10 mg by mouth daily. 02/02/21   [provider]  ELIQUIS 2.5 MG TABS tablet TAKE 1 TABLET TWICE A DAY Patient taking differently: Take 2.5 mg by mouth 2 (two) times daily. 03/28/21   Brunetta Genera, MD  furosemide (LASIX) 20 MG tablet Take 20 mg by mouth See admin instructions. Take 20 mg daily and an additional 20 mg on the days of swelling or increased weight gain of 3 lb over night or 5 lb in 1 week.    [provider]  hydrALAZINE (APRESOLINE) 100 MG tablet Take 1 tablet 3 times daily Patient taking differently: Take 100 mg by mouth 3 (three) times daily. 10/26/21   Darreld Mclean, PA-C  levothyroxine (SYNTHROID) 25 MCG tablet Take 25 mcg by mouth daily before breakfast.  09/20/18   [provider]  lidocaine-prilocaine (EMLA) cream Apply to affected area once Patient taking differently: Apply 1 application. topically See admin instructions. Apply to port site prior to port access 04/13/20   Brunetta Genera, MD  Multiple Vitamins-Minerals (PRESERVISION AREDS PO) Take 1 capsule by mouth 2 (two) times daily.  [provider]  omeprazole (PRILOSEC) 20 MG capsule Take 1 capsule (20 mg total) by mouth daily. 10/11/21   Kommor, Madison, MD  ondansetron (ZOFRAN) 8 MG tablet Take 1 tablet (8 mg total) by mouth 2 (two) times daily as needed. Start on the third day after chemotherapy. Patient taking differently: Take 8 mg by mouth 2 (two) times daily as needed for nausea or vomiting. Start on the third day after chemotherapy. 04/14/19   Brunetta Genera, MD  Polyethyl Glycol-Propyl Glycol (SYSTANE) 0.4-0.3 % GEL ophthalmic gel Place 1 application into both eyes daily as needed (dry eyes).    [provider]  sodium chloride (MURO 128) 5 % ophthalmic ointment Place 1 application into both eyes at bedtime.    [provider]  zolpidem (AMBIEN) 5 MG tablet Take 5 mg by mouth at bedtime. 12/07/19   [provider]    Physical Exam: Vitals:   10/31/21 1800 10/31/21 1830 10/31/21 1900 10/31/21 1945  BP:  (!) 122/107 (!) 159/70 (!) 162/74  Pulse: (!) 45  (!) 111 (!) 29  Resp:  16 19 17   Temp:      TempSrc:      SpO2:  97% 100% 95%    Physical Exam Vitals reviewed.  Constitutional:      General: She is not in acute distress. HENT:     Head: Normocephalic and atraumatic.  Eyes:     Extraocular Movements: Extraocular movements intact.     Conjunctiva/sclera:  Conjunctivae normal.  Cardiovascular:     Rate and Rhythm: Regular rhythm. Bradycardia present.     Pulses: Normal pulses.  Pulmonary:     Effort: Pulmonary effort is normal. No respiratory distress.     Breath sounds: Normal breath sounds. No wheezing or rales.  Abdominal:     General: Bowel sounds are normal. There is no distension.     Palpations: Abdomen is soft.     Tenderness: There is no abdominal tenderness.  Musculoskeletal:        General: No swelling or tenderness.     Cervical back: Normal range of motion and neck supple.  Skin:    General: Skin is warm and dry.  Neurological:     General: No focal deficit present.     Mental Status: She is alert and oriented to person, place, and time.      Labs on Admission: I have personally reviewed following labs and imaging studies  CBC: Recent Labs  Lab 10/31/21 1851  WBC 3.6*  HGB 12.8  HCT 39.6  MCV 93.8  PLT 326*   Basic Metabolic Panel: Recent Labs  Lab 10/26/21 1117 10/31/21 1851  NA 145* 137  K 3.6 3.4*  CL 103 101  CO2 26 27  GLUCOSE 81 92  BUN 19 22  CREATININE 0.69 0.69  CALCIUM 9.9 9.3   GFR: Estimated Creatinine Clearance: 38.9 mL/min (by C-G formula based on SCr of 0.69 mg/dL). Liver Function Tests: No results for input(s): "AST", "ALT", "ALKPHOS", "BILITOT", "PROT", "ALBUMIN" in the last 168 hours. No results for input(s): "LIPASE", "AMYLASE" in the last 168 hours. No results for input(s): "AMMONIA" in the last 168 hours. Coagulation Profile: No results for input(s): "INR", "PROTIME" in the last 168 hours. Cardiac Enzymes: No results for input(s): "CKTOTAL", "CKMB", "CKMBINDEX", "TROPONINI" in the last 168 hours. BNP (last 3 results) No results for input(s): "PROBNP" in the last 8760 hours. HbA1C: No results for input(s): "HGBA1C" in the last 72 hours. CBG: Recent  Labs  Lab 10/31/21 1545 10/31/21 1849  GLUCAP 136* 92   Lipid Profile: No results for input(s): "CHOL", "HDL",  "LDLCALC", "TRIG", "CHOLHDL", "LDLDIRECT" in the last 72 hours. Thyroid Function Tests: No results for input(s): "TSH", "T4TOTAL", "FREET4", "T3FREE", "THYROIDAB" in the last 72 hours. Anemia Panel: No results for input(s): "VITAMINB12", "FOLATE", "FERRITIN", "TIBC", "IRON", "RETICCTPCT" in the last 72 hours. Urine analysis:    Component Value Date/Time   COLORURINE YELLOW 10/31/2021 2010   APPEARANCEUR HAZY (A) 10/31/2021 2010   LABSPEC 1.010 10/31/2021 2010   PHURINE 7.0 10/31/2021 2010   GLUCOSEU NEGATIVE 10/31/2021 2010   HGBUR NEGATIVE 10/31/2021 2010   BILIRUBINUR NEGATIVE 10/31/2021 2010   [place] NEGATIVE 10/31/2021 2010   PROTEINUR NEGATIVE 10/31/2021 2010   UROBILINOGEN 0.2 04/09/2012 1202   NITRITE NEGATIVE 10/31/2021 2010   LEUKOCYTESUR NEGATIVE 10/31/2021 2010    Radiological Exams on Admission: I have personally reviewed images DG Chest Port 1 View  Result Date: 10/31/2021 CLINICAL DATA:  Weakness EXAM: PORTABLE CHEST 1 VIEW COMPARISON:  10/11/2021 FINDINGS: Right-sided central venous port tip over the SVC. Cardiomegaly with aortic atherosclerosis. No consolidation, pleural effusion or pneumothorax. Clips over the right chest. IMPRESSION: No active disease.  Cardiomegaly. Electronically Signed   By: Donavan Foil M.D.   On: 10/31/2021 18:05    EKG: Independently reviewed.  Sinus rhythm, LAFB, LVH, second-degree AV block type I (Wenckebach).  Assessment and Plan  Bradycardia 2/2 Mobitz type I second-degree AV block vs CHB EKG at present showing second-degree AV block type I (Wenckebach).  Coreg was stopped during recent hospitalization and ZIO monitor placed - per recent cardiology visit note from 6/7 there were episodes of complete heart block.  In the ED today, her heart rate is ranging between 30s to 60s but blood pressure stable.  Not on any AV nodal blocking agents. -Cardiac monitoring/admit to progressive care unit -Cardiology consulted, EP team will see the  patient in the morning. -Check TSH  History of PE/DVT -Continue Eliquis  History of stroke -Continue Eliquis and statin.  Hypertension Systolic currently in the 150s. -Hold antihypertensives at this time to avoid hypotension in the setting of bradycardia.  Hyperlipidemia -Continue statin.  Hypothyroidism -Continue Synthroid -Check TSH  Angioimmunoblastic T-cell lymphoma She is not on treatment.  Recently seen by oncology on 5/17 and it was felt that there was no obvious lab or clinical evidence of progression/recurrence of her disease. -Outpatient oncology follow-up.  GERD -Continue PPI  Chronic insomnia -Continue Ambien  Depression -Continue Celexa  DVT prophylaxis: Eliquis Code Status: DNR (discussed with the patient) Family Communication: Daughter at bedside. Consults called: Cardiology Level of care: Progressive Care Unit Admission status: It is my clinical opinion that referral for OBSERVATION is reasonable and necessary in this patient based on the above information provided. The aforementioned taken together are felt to place the patient at high risk for further clinical deterioration. However, it is anticipated that the patient may be medically stable for discharge from the hospital within 24 to 48 hours.   Shela Leff MD [facility]  If 7PM-7AM, please contact night-coverage [email or web address]  10/31/2021, 10:11 PM

## 2021-10-31 NOTE — ED Triage Notes (Addendum)
Pt was brought in after starting to have weakness today.  EMS reported questionable orthostatics. BP 100/50. CBG 130.  Pt is currently wearing Holter Monitor and EMS reported patient aware may need Pacer placed for going in and out of 2nd degree HB. Pt is alert and [facility] a [facility]. Pt has portacath and wants that accessed for IV need. PT lives in independent living at [facility]

## 2021-10-31 NOTE — ED Provider Notes (Signed)
[facility] EMERGENCY DEPARTMENT Provider Note   CSN: [id number] Arrival date & time: 10/31/21  1534     History  Chief Complaint  Patient presents with   Weakness    Sarah [name] is a 88 y.o. female.  88 year old female with prior medical history as detailed below presents for evaluation.  She complains of generalized weakness and fatigue.  Symptoms have been ongoing for the last 2 to 3 days.  Reports nausea without vomiting.  Patient denies chest pain or shortness of breath.  Patient with known history of secondary heart block.  Patient is to be seen by EP tomorrow.  The history is provided by the patient and medical records.  Weakness Severity:  Moderate Onset quality:  Gradual Duration:  4 days Timing:  Constant Progression:  Worsening Chronicity:  New      Home Medications Prior to Admission medications   Medication Sig Start Date End Date Taking? Authorizing Provider  acetaminophen (TYLENOL) 500 MG tablet Take 1,000 mg by mouth every 6 (six) hours as needed for moderate pain.    [provider]  amLODipine (NORVASC) 5 MG tablet Take 1 tablet (5 mg total) by mouth daily. Started 5 days ago 10/26/21   Sande Rives E, PA-C  atorvastatin (LIPITOR) 20 MG tablet Take 1 tablet (20 mg total) by mouth daily. Patient taking differently: Take 20 mg by mouth every evening. 05/18/21   Ghimire, Henreitta Leber, MD  B Complex-C (SUPER B COMPLEX PO) Take 1 tablet by mouth daily.    [provider]  Cholecalciferol (VITAMIN D) 50 MCG (2000 UT) tablet Take 2,000 Units by mouth 2 (two) times daily.    [provider]  citalopram (CELEXA) 10 MG tablet Take 10 mg by mouth daily. 02/02/21   [provider]  ELIQUIS 2.5 MG TABS tablet TAKE 1 TABLET TWICE A DAY Patient taking differently: Take 2.5 mg by mouth 2 (two) times daily. 03/28/21   Brunetta Genera, MD  furosemide (LASIX) 20 MG tablet Take 20 mg by mouth See admin  instructions. Take 20 mg daily and an additional 20 mg on the days of swelling or increased weight gain of 3 lb over night or 5 lb in 1 week.    [provider]  hydrALAZINE (APRESOLINE) 100 MG tablet Take 1 tablet 3 times daily 10/26/21   Darreld Mclean, PA-C  levothyroxine (SYNTHROID) 25 MCG tablet Take 25 mcg by mouth daily before breakfast.  09/20/18   [provider]  lidocaine-prilocaine (EMLA) cream Apply to affected area once Patient taking differently: Apply 1 application. topically See admin instructions. Apply to port site prior to port access 04/13/20   Brunetta Genera, MD  Multiple Vitamins-Minerals (PRESERVISION AREDS PO) Take 1 capsule by mouth 2 (two) times daily.     [provider]  omeprazole (PRILOSEC) 20 MG capsule Take 1 capsule (20 mg total) by mouth daily. 10/11/21   Kommor, Madison, MD  ondansetron (ZOFRAN) 8 MG tablet Take 1 tablet (8 mg total) by mouth 2 (two) times daily as needed. Start on the third day after chemotherapy. Patient taking differently: Take 8 mg by mouth 2 (two) times daily as needed for nausea or vomiting. Start on the third day after chemotherapy. 04/14/19   Brunetta Genera, MD  Polyethyl Glycol-Propyl Glycol (SYSTANE) 0.4-0.3 % GEL ophthalmic gel Place 1 application into both eyes daily as needed (dry eyes).    [provider]  sodium chloride (MURO 128) 5 %  ophthalmic ointment Place 1 application into both eyes at bedtime.    [provider]  zolpidem (AMBIEN) 5 MG tablet Take 5 mg by mouth at bedtime. 12/07/19   [provider]      Allergies    Alendronate sodium, Amoxicillin-pot clavulanate, Codeine, Lisinopril, Septra [sulfamethoxazole-trimethoprim], Thiazide-type diuretics, and Latex    Review of Systems   Review of Systems  Neurological:  Positive for weakness.  All other systems reviewed and are negative.   Physical Exam Updated Vital Signs BP (!) 162/74   Pulse (!) 29    Temp 98.3 F (36.8 C) (Oral)   Resp 17   SpO2 95%  Physical Exam  ED Results / Procedures / Treatments   Labs (all labs ordered are listed, but only abnormal results are displayed) Labs Reviewed  BASIC METABOLIC PANEL - Abnormal; Notable for the following components:      Result Value   Potassium 3.4 (*)    All other components within normal limits  CBC - Abnormal; Notable for the following components:   WBC 3.6 (*)    Platelets 136 (*)    All other components within normal limits  CBG MONITORING, ED - Abnormal; Notable for the following components:   Glucose-Capillary 136 (*)    All other components within normal limits  URINALYSIS, ROUTINE W REFLEX MICROSCOPIC  CBG MONITORING, ED  TROPONIN I (HIGH SENSITIVITY)  TROPONIN I (HIGH SENSITIVITY)    EKG EKG Interpretation  Date/Time:  Monday October 31 2021 17:45:09 EDT Ventricular Rate:  60 PR Interval:    QRS Duration: 103 QT Interval:  468 QTC Calculation: 468 R Axis:   -74 Text Interpretation: Sinus rhythm Second deg AVB, Mobitz I (Wenckebach) Left anterior fascicular block Left ventricular hypertrophy Probable anterior infarct, age indeterminate Confirmed by Dene Gentry [phone]) on 10/31/2021 5:58:50 PM  Radiology DG Chest Port 1 View  Result Date: 10/31/2021 CLINICAL DATA:  Weakness EXAM: PORTABLE CHEST 1 VIEW COMPARISON:  10/11/2021 FINDINGS: Right-sided central venous port tip over the SVC. Cardiomegaly with aortic atherosclerosis. No consolidation, pleural effusion or pneumothorax. Clips over the right chest. IMPRESSION: No active disease.  Cardiomegaly. Electronically Signed   By: Donavan Foil M.D.   On: 10/31/2021 18:05    Procedures Procedures    Medications Ordered in ED Medications  sodium chloride 0.9 % bolus 500 mL (500 mLs Intravenous New Bag/Given 10/31/21 1854)    ED Course/ Medical Decision Making/ A&P                           Medical Decision Making Amount and/or Complexity of Data  Reviewed Radiology: ordered.    Medical Screen Complete  This patient presented to the ED with complaint of weakness, bradycardia.  This complaint involves an extensive number of treatment options. The initial differential diagnosis includes, but is not limited to, arrhythmia, metabolic abnormality, etc.  This presentation is: Acute, Self-Limited, Previously Undiagnosed, Uncertain Prognosis, Complicated, Systemic Symptoms, and Threat to Life/Bodily Function  Patient with known history of second-degree heart block is scheduled for EP evaluation tomorrow.  She presents today with complaint of gradually worsening weakness over the last 3 to 4 days.  Patient without other acute findings on screening exam.  Patient is noted to be moderately bradycardic with maintained blood pressures.  Patient discussed with Dr. Margaretann Loveless, Cardiology, who agrees with plan for admission.  Patient will be consulted upon by cards tomorrow.  EP will see tomorrow.  Additional history obtained:  External records from outside sources obtained and reviewed including prior ED visits and prior Inpatient records.    Lab Tests:  I ordered and personally interpreted labs.  The pertinent results include: CBC, BMP, troponin, UA   Imaging Studies ordered:  I ordered imaging studies including chest x-ray I independently visualized and interpreted obtained imaging which showed NAD I agree with the radiologist interpretation.   Cardiac Monitoring:  The patient was maintained on a cardiac monitor.  I personally viewed and interpreted the cardiac monitor which showed an underlying rhythm of: Bradycardia, second-degree heart block   Medicines ordered:  I ordered medication including IV fluids for possible dehydration Reevaluation of the patient after these medicines showed that the patient: improved  Problem List / ED Course:  Bradycardia, weakness   Reevaluation:  After the interventions noted above,  I reevaluated the patient and found that they have: improved   Disposition:  After consideration of the diagnostic results and the patients response to treatment, I feel that the patent would benefit from admission.   CRITICAL CARE Performed by: Valarie Merino   Total critical care time: 45 minutes  Critical care time was exclusive of separately billable procedures and treating other patients.  Critical care was necessary to treat or prevent imminent or life-threatening deterioration.  Critical care was time spent personally by me on the following activities: development of treatment plan with patient and/or surrogate as well as nursing, discussions with consultants, evaluation of patient's response to treatment, examination of patient, obtaining history from patient or surrogate, ordering and performing treatments and interventions, ordering and review of laboratory studies, ordering and review of radiographic studies, pulse oximetry and re-evaluation of patient's condition.          Final Clinical Impression(s) / ED Diagnoses Final diagnoses:  Weakness  Bradycardia    Rx / DC Orders ED Discharge Orders     None         Valarie Merino, MD 10/31/21 2037

## 2021-10-31 NOTE — ED Notes (Signed)
Patient noted to have a HR of 34, ED provider made aware. Patient has pacing pads placed.

## 2021-11-01 ENCOUNTER — Institutional Professional Consult (permissible substitution): Payer: Medicare Other | Admitting: Cardiology

## 2021-11-01 ENCOUNTER — Encounter (HOSPITAL_COMMUNITY)
Admission: EM | Disposition: A | Discharge status: Skilled Nursing Facility | Payer: Self-pay | Source: Home / Self Care | Attending: Family Medicine

## 2021-11-01 DIAGNOSIS — M898X6 Other specified disorders of bone, lower leg: Secondary | ICD-10-CM

## 2021-11-01 DIAGNOSIS — I10 Essential (primary) hypertension: Secondary | ICD-10-CM | POA: Diagnosis present

## 2021-11-01 DIAGNOSIS — R278 Other lack of coordination: Secondary | ICD-10-CM | POA: Diagnosis not present

## 2021-11-01 DIAGNOSIS — Z8572 Personal history of non-Hodgkin lymphomas: Secondary | ICD-10-CM | POA: Diagnosis not present

## 2021-11-01 DIAGNOSIS — R001 Bradycardia, unspecified: Secondary | ICD-10-CM | POA: Diagnosis present

## 2021-11-01 DIAGNOSIS — I441 Atrioventricular block, second degree: Secondary | ICD-10-CM | POA: Diagnosis not present

## 2021-11-01 DIAGNOSIS — E785 Hyperlipidemia, unspecified: Secondary | ICD-10-CM | POA: Diagnosis present

## 2021-11-01 DIAGNOSIS — Z86718 Personal history of other venous thrombosis and embolism: Secondary | ICD-10-CM | POA: Diagnosis not present

## 2021-11-01 DIAGNOSIS — Z880 Allergy status to penicillin: Secondary | ICD-10-CM | POA: Diagnosis not present

## 2021-11-01 DIAGNOSIS — Z950 Presence of cardiac pacemaker: Secondary | ICD-10-CM | POA: Diagnosis not present

## 2021-11-01 DIAGNOSIS — Z86711 Personal history of pulmonary embolism: Secondary | ICD-10-CM | POA: Diagnosis not present

## 2021-11-01 DIAGNOSIS — Z79890 Hormone replacement therapy (postmenopausal): Secondary | ICD-10-CM | POA: Diagnosis not present

## 2021-11-01 DIAGNOSIS — H9190 Unspecified hearing loss, unspecified ear: Secondary | ICD-10-CM | POA: Diagnosis present

## 2021-11-01 DIAGNOSIS — Z79899 Other long term (current) drug therapy: Secondary | ICD-10-CM | POA: Diagnosis not present

## 2021-11-01 DIAGNOSIS — E039 Hypothyroidism, unspecified: Secondary | ICD-10-CM | POA: Diagnosis present

## 2021-11-01 DIAGNOSIS — Z96651 Presence of right artificial knee joint: Secondary | ICD-10-CM | POA: Diagnosis present

## 2021-11-01 DIAGNOSIS — Z7901 Long term (current) use of anticoagulants: Secondary | ICD-10-CM | POA: Diagnosis not present

## 2021-11-01 DIAGNOSIS — Z8673 Personal history of transient ischemic attack (TIA), and cerebral infarction without residual deficits: Secondary | ICD-10-CM | POA: Diagnosis not present

## 2021-11-01 DIAGNOSIS — I442 Atrioventricular block, complete: Secondary | ICD-10-CM

## 2021-11-01 DIAGNOSIS — Z9011 Acquired absence of right breast and nipple: Secondary | ICD-10-CM | POA: Diagnosis not present

## 2021-11-01 DIAGNOSIS — F32A Depression, unspecified: Secondary | ICD-10-CM | POA: Diagnosis present

## 2021-11-01 DIAGNOSIS — Z853 Personal history of malignant neoplasm of breast: Secondary | ICD-10-CM | POA: Diagnosis not present

## 2021-11-01 DIAGNOSIS — E876 Hypokalemia: Secondary | ICD-10-CM | POA: Diagnosis present

## 2021-11-01 DIAGNOSIS — R2681 Unsteadiness on feet: Secondary | ICD-10-CM | POA: Diagnosis not present

## 2021-11-01 DIAGNOSIS — R009 Unspecified abnormalities of heart beat: Secondary | ICD-10-CM | POA: Diagnosis not present

## 2021-11-01 DIAGNOSIS — Z90710 Acquired absence of both cervix and uterus: Secondary | ICD-10-CM | POA: Diagnosis not present

## 2021-11-01 DIAGNOSIS — M6281 Muscle weakness (generalized): Secondary | ICD-10-CM | POA: Diagnosis not present

## 2021-11-01 DIAGNOSIS — Z972 Presence of dental prosthetic device (complete) (partial): Secondary | ICD-10-CM | POA: Diagnosis not present

## 2021-11-01 DIAGNOSIS — K219 Gastro-esophageal reflux disease without esophagitis: Secondary | ICD-10-CM | POA: Diagnosis present

## 2021-11-01 DIAGNOSIS — F5104 Psychophysiologic insomnia: Secondary | ICD-10-CM | POA: Diagnosis present

## 2021-11-01 DIAGNOSIS — Z885 Allergy status to narcotic agent status: Secondary | ICD-10-CM | POA: Diagnosis not present

## 2021-11-01 DIAGNOSIS — Z66 Do not resuscitate: Secondary | ICD-10-CM | POA: Diagnosis present

## 2021-11-01 HISTORY — PX: PACEMAKER IMPLANT: EP1218

## 2021-11-01 LAB — BASIC METABOLIC PANEL
Anion gap: 7 (ref 5–15)
BUN: 16 mg/dL (ref 8–23)
CO2: 26 mmol/L (ref 22–32)
Calcium: 9.4 mg/dL (ref 8.9–10.3)
Chloride: 108 mmol/L (ref 98–111)
Creatinine, Ser: 0.65 mg/dL (ref 0.44–1.00)
GFR, Estimated: >60 mL/min (ref >60)
Glucose, Bld: 91 mg/dL (ref 70–99)
Potassium: 3.5 mmol/L (ref 3.5–5.1)
Sodium: 141 mmol/L (ref 135–145)

## 2021-11-01 LAB — TSH: TSH: 1.225 u[IU]/mL (ref 0.350–4.500)

## 2021-11-01 LAB — SURGICAL PCR SCREEN
MRSA, PCR: NEGATIVE
Staphylococcus aureus: NEGATIVE

## 2021-11-01 SURGERY — PACEMAKER IMPLANT

## 2021-11-01 MED ORDER — IOHEXOL 350 MG/ML SOLN
INTRAVENOUS | Status: DC | PRN
  Administered 2021-11-01: 15 mL

## 2021-11-01 MED ORDER — SODIUM CHLORIDE 0.9 % IV SOLN: INTRAVENOUS | Status: DC

## 2021-11-01 MED ORDER — HEPARIN (PORCINE) IN NACL 1000-0.9 UT/500ML-% IV SOLN
INTRAVENOUS | Status: AC
  Filled 2021-11-01: qty 500

## 2021-11-01 MED ORDER — AMLODIPINE BESYLATE 5 MG PO TABS
5.0000 mg | ORAL_TABLET | Freq: Every day | ORAL | Status: DC
  Administered 2021-11-01: 5 mg via ORAL
  Filled 2021-11-01: qty 1

## 2021-11-01 MED ORDER — POTASSIUM CHLORIDE CRYS ER 20 MEQ PO TBCR
40.0000 meq | EXTENDED_RELEASE_TABLET | Freq: Once | ORAL | Status: DC
  Filled 2021-11-01: qty 2

## 2021-11-01 MED ORDER — SODIUM CHLORIDE 0.9 % IV SOLN
INTRAVENOUS | Status: DC
Start: 2021-11-01 — End: 2021-11-04

## 2021-11-01 MED ORDER — SODIUM CHLORIDE 0.9 % IV SOLN
INTRAVENOUS | Status: AC
  Filled 2021-11-01: qty 2

## 2021-11-01 MED ORDER — HYDRALAZINE HCL 50 MG PO TABS
100.0000 mg | ORAL_TABLET | Freq: Three times a day (TID) | ORAL | Status: DC
Start: 2021-11-01 — End: 2021-11-02
  Administered 2021-11-01: 100 mg via ORAL
  Filled 2021-11-01 (×3): qty 2

## 2021-11-01 MED ORDER — HEPARIN (PORCINE) IN NACL 1000-0.9 UT/500ML-% IV SOLN
INTRAVENOUS | Status: DC | PRN
  Administered 2021-11-01: 500 mL

## 2021-11-01 MED ORDER — VANCOMYCIN HCL IN DEXTROSE 1-5 GM/200ML-% IV SOLN
1000.0000 mg | INTRAVENOUS | Status: AC
  Administered 2021-11-01: 1000 mg via INTRAVENOUS

## 2021-11-01 MED ORDER — LIDOCAINE HCL (PF) 1 % IJ SOLN
INTRAMUSCULAR | Status: DC | PRN
  Administered 2021-11-01: 60 mL

## 2021-11-01 MED ORDER — MIDAZOLAM HCL 5 MG/5ML IJ SOLN
INTRAMUSCULAR | Status: AC
  Filled 2021-11-01: qty 5

## 2021-11-01 MED ORDER — LIDOCAINE HCL 1 % IJ SOLN
INTRAMUSCULAR | Status: AC
  Filled 2021-11-01: qty 60

## 2021-11-01 MED ORDER — CHLORHEXIDINE GLUCONATE CLOTH 2 % EX PADS
6.0000 | MEDICATED_PAD | Freq: Every day | CUTANEOUS | Status: DC
  Administered 2021-11-02 – 2021-11-03 (×2): 6 via TOPICAL

## 2021-11-01 MED ORDER — APIXABAN 2.5 MG PO TABS: 2.5000 mg | ORAL_TABLET | Freq: Two times a day (BID) | ORAL | 1 refills | Status: DC

## 2021-11-01 MED ORDER — MUPIROCIN 2 % EX OINT
TOPICAL_OINTMENT | Freq: Two times a day (BID) | CUTANEOUS | Status: DC
Start: 2021-11-01 — End: 2021-11-03
  Administered 2021-11-02: 1 via NASAL
  Filled 2021-11-01: qty 22

## 2021-11-01 MED ORDER — HYDRALAZINE HCL 20 MG/ML IJ SOLN
INTRAMUSCULAR | Status: DC | PRN
  Administered 2021-11-01: 10 mg via INTRAVENOUS

## 2021-11-01 MED ORDER — HYDRALAZINE HCL 20 MG/ML IJ SOLN
INTRAMUSCULAR | Status: AC
  Filled 2021-11-01: qty 1

## 2021-11-01 MED ORDER — SODIUM CHLORIDE 0.9 % IV SOLN
80.0000 mg | INTRAVENOUS | Status: AC
  Administered 2021-11-01: 80 mg
  Filled 2021-11-01: qty 2

## 2021-11-01 MED ORDER — VANCOMYCIN HCL IN DEXTROSE 1-5 GM/200ML-% IV SOLN
INTRAVENOUS | Status: AC
  Filled 2021-11-01: qty 200

## 2021-11-01 MED ORDER — MIDAZOLAM HCL 5 MG/5ML IJ SOLN
INTRAMUSCULAR | Status: DC | PRN
  Administered 2021-11-01 (×2): 1 mg via INTRAVENOUS

## 2021-11-01 MED ORDER — CEFAZOLIN SODIUM-DEXTROSE 2-4 GM/100ML-% IV SOLN
INTRAVENOUS | Status: AC
  Filled 2021-11-01: qty 100

## 2021-11-01 MED ORDER — FENTANYL CITRATE (PF) 100 MCG/2ML IJ SOLN
INTRAMUSCULAR | Status: AC
  Filled 2021-11-01: qty 2

## 2021-11-01 MED ORDER — CHLORHEXIDINE GLUCONATE 4 % EX LIQD
60.0000 mL | Freq: Once | CUTANEOUS | Status: DC
  Filled 2021-11-01: qty 60

## 2021-11-01 MED ORDER — FENTANYL CITRATE (PF) 100 MCG/2ML IJ SOLN
INTRAMUSCULAR | Status: DC | PRN
  Administered 2021-11-01 (×2): 25 ug via INTRAVENOUS

## 2021-11-01 MED ORDER — CHLORHEXIDINE GLUCONATE 4 % EX LIQD: 60.0000 mL | Freq: Once | CUTANEOUS | Status: DC

## 2021-11-01 MED ORDER — ONDANSETRON HCL 4 MG/2ML IJ SOLN: 4.0000 mg | Freq: Four times a day (QID) | INTRAMUSCULAR | Status: DC | PRN

## 2021-11-01 SURGICAL SUPPLY — 11 items
CABLE SURGICAL S-101-97-12 (CABLE) ×2 IMPLANT
KIT ACCESSORY SELECTRA FIX CVD (MISCELLANEOUS) ×1 IMPLANT
LEAD SELECTRA 3D-55-42 (CATHETERS) ×1 IMPLANT
LEAD SOLIA S PRO MRI 53 (Lead) ×1 IMPLANT
LEAD SOLIA S PRO MRI 60 (Lead) ×1 IMPLANT
PACEMAKER EDORA 8DR-T MRI (Pacemaker) ×1 IMPLANT
PAD DEFIB RADIO PHYSIO CONN (PAD) ×2 IMPLANT
SHEATH 7FR PRELUDE SNAP 13 (SHEATH) ×1 IMPLANT
SHEATH 8FR PRELUDE SNAP 13 (SHEATH) ×1 IMPLANT
SHEATH CLASSIC 9F (SHEATH) ×1 IMPLANT
TRAY PACEMAKER INSERTION (PACKS) ×2 IMPLANT

## 2021-11-01 NOTE — Consult Note (Signed)
ELECTROPHYSIOLOGY CONSULT NOTE    Patient ID: Sarah [name] MRN: [id number], DOB/AGE: Sep 23, 1932 88 y.o.  Admit date: 10/31/2021 Date of Consult: 11/01/2021  Primary Physician: Lajean Manes, MD Primary Cardiologist: Buford Dresser, MD  Electrophysiologist:  New  Referring Provider: Dr. Lonny Prude  Patient Profile: Sarah [name] is a 88 y.o. 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 Harrell Gave who we are seeing today for evaluation of symptomatic bradycardia at the request of Dr. Lonny Prude.  HPI:  Sarah [name] is a 88 y.o. female as above.   She was not seen by Cardiology again until recent hospitalization. She was admitted from 09/30/2021 to 10/03/2021 after presenting with confusion and being found to have sinus bradycardia with 2nd degree type 1 AV block (Wenckebach) and rates in the 40s. Home Coreg was stopped with improvement in rates but she continue to have intermittent Wenckebach. However, she was asymptomatic with this. Live Zio monitor was placed prior to discharge. She then presented back to the ED on 10/11/2021 for further evaluation of dull ache on the left side of her chest for the last 3 days as well as waves of nausea which she reported she has had for about 1 year. EKG showed no acute ischemic changes. High-sensitivity troponin negative x2. Patient was given a GI cocktail and symptoms completely resolved. She was noted to have continued Wenckebach on telemetry in the ED. She was ultimately felt to be stable for discharge with close Cardiology follow-up. However, after patient was discharged from the ED, we received a Zio report that patient had a short episodes of complete heart block. Patient was referred to EP.  Pt was scheduled to see Dr. Curt Bears in the office today.   She presented  to [facility] last night with weakness, fatigue, and nausea. Found to be intermittently bradycardia into the 30s on arrival.   Pertinent labs on admission include K 3.4, Cr 0.69, WBC 3.6, Hgb 12.8, Platelets 136k, HS trop negative x 2, UA without infection.   CXR showed cardiomegaly without active disease. Pt given 500 cc normal saline.   EP asked to see for PPM consideration given symptomatic bradycardia and CHB noted on monitor.   Currently, she is feeling OK. Symptoms have waxed and waned since initial presentation last month, but have especially worsened in the past several days. She denies any history of syncope. Last dose of Eliquis last night.  She has been in remission from her lymphoma but port remains in place. Labs drawn from here at intermittent Oncology follow ups.   Past Medical History:  Diagnosis Date   Allergy    codeine, thiazides   Anxiety    new dx   Arrhythmia    Arthritis    Atrioventricular block, Mobitz type 1, Wenckebach    Breast cancer ([place]) 03/14/2012   bx=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,ER/PR=+,upper inner quad   Cancer (HCC)    breast   Cataract    DVT (deep venous thrombosis) ([place]) 02/2019   left leg   Dyspnea    Edema    both legs feet and toe, abdomen   Glaucoma    laser treated years ago   HOH (hard of hearing)    Hyperlipemia    Hypertension    Hypothyroidism    Pancreatic cyst    benign   PONV (  postoperative nausea and vomiting)    Pulmonary embolism ([place]) 02/2019   bilateral    Radiation 06/11/2012-07/12/2012   17 sessions 4250 cGy, 3 sessions 750 cGy   Vertigo    Wears glasses    Wears partial dentures    partial upper     Surgical History:  Past Surgical History:  Procedure Laterality Date   ABDOMINAL HYSTERECTOMY  1966   1/2 ovary left in    [place]   lumpectomy-lt   CATARACT EXTRACTION     b/l   COLONOSCOPY     EXCISION MASS NECK Left 03/17/2019    EXCISION MASS NECK (Left Neck)   EXCISION  MASS NECK Left 03/17/2019   Procedure: EXCISION MASS NECK;  Surgeon: Helayne Seminole, MD;  Location: [facility];  Service: ENT;  Laterality: Left;   EYE SURGERY Bilateral    bilateral cataract removal   IR IMAGING GUIDED PORT INSERTION  04/23/2019   JOINT REPLACEMENT  2013   rt total knee   JOINT REPLACEMENT  1995   lt total knee   PARTIAL MASTECTOMY WITH NEEDLE LOCALIZATION  04/09/2012   Procedure: PARTIAL MASTECTOMY WITH NEEDLE LOCALIZATION;  Surgeon: Adin Hector, MD;  Location: [facility];  Service: General;  Laterality: Right;   SKIN BIOPSY Left 03/17/2019   LEFT THIGH   SKIN BIOPSY Left 03/17/2019   Procedure: Skin Biopsy Left Thigh;  Surgeon: Helayne Seminole, MD;  Location: [facility] OR;  Service: ENT;  Laterality: Left;   TONSILLECTOMY     TOTAL KNEE ARTHROPLASTY  08/16/2011   Procedure: TOTAL KNEE ARTHROPLASTY;  Surgeon: Ninetta Lights, MD;  Location: [facility];  Service: Orthopedics;  Laterality: Right;     (Not in a hospital admission)   Inpatient Medications:   atorvastatin  20 mg Oral QPM   citalopram  10 mg Oral Daily   levothyroxine  25 mcg Oral Q0600   pantoprazole  40 mg Oral Daily   zolpidem  5 mg Oral QHS    Allergies:  Allergies  Allergen Reactions   Alendronate Sodium Other (See Comments)    UNK reaction   Amoxicillin-Pot Clavulanate Other (See Comments)    UNK reaction   Codeine Other (See Comments)    Reaction not recalled   Lisinopril Other (See Comments)    UNK reaction   Septra [Sulfamethoxazole-Trimethoprim] Other (See Comments)   Thiazide-Type Diuretics Other (See Comments)    Blurry vision?? (patient stated she has macular degeneration)   Latex Rash    Social History   Socioeconomic History   Marital status: Widowed    Spouse name: Not on file   Number of children: 2   Years of education: Not on file   Highest education level: Not on file  Occupational History   Occupation: Retired    Comment: Community education officer    Tobacco Use   Smoking status: Never   Smokeless tobacco: Never  Vaping Use   Vaping Use: Never used  Substance and Sexual Activity   Alcohol use: No   Drug use: No   Sexual activity: Not Currently  Other Topics Concern   Not on file  Social History Narrative   Not on file   Social Determinants of Health   Financial Resource Strain: Not on file  Food Insecurity: Not on file  Transportation Needs: Not on file  Physical Activity: Not on file  Stress: Not on file  Social Connections: Not on file  Intimate Partner Violence: Not on  file     Family History  Problem Relation Age of Onset   Heart disease Father    Cancer Maternal Aunt        stomach     Review of Systems: All other systems reviewed and are otherwise negative except as noted above.  Physical Exam: Vitals:   11/01/21 0500 11/01/21 0515 11/01/21 0530 11/01/21 0805  BP: (!) 159/62  (!) 169/72 (!) 150/85  Pulse: (!) 129 (!) 129 (!) 26 64  Resp: (!) 21 19 12 10   Temp:    97.7 F (36.5 C)  TempSrc:    Oral  SpO2: 93% 99% 96% 98%    GEN- The patient is well appearing, alert and oriented x 3 today.   HEENT: normocephalic, atraumatic; sclera clear, conjunctiva pink; hearing intact; oropharynx clear; neck supple Lungs- Clear to ausculation bilaterally, normal work of breathing.  No wheezes, rales, rhonchi Heart- Regular rate and rhythm, no murmurs, rubs or gallops GI- soft, non-tender, non-distended, bowel sounds present Extremities- no clubbing, cyanosis, or edema; DP/PT/radial pulses 2+ bilaterally MS- no significant deformity or atrophy Skin- warm and dry, no rash or lesion Psych- euthymic mood, full affect Neuro- strength and sensation are intact  Labs:   Lab Results  Component Value Date   WBC 3.6 (L) 10/31/2021   HGB 12.8 10/31/2021   HCT 39.6 10/31/2021   MCV 93.8 10/31/2021   PLT 136 (L) 10/31/2021    Recent Labs  Lab 10/31/21 1851  NA 137  K 3.4*  CL 101  CO2 27  BUN 22  CREATININE  0.69  CALCIUM 9.3  GLUCOSE 92      Radiology/Studies: DG Chest Port 1 View  Result Date: 10/31/2021 CLINICAL DATA:  Weakness EXAM: PORTABLE CHEST 1 VIEW COMPARISON:  10/11/2021 FINDINGS: Right-sided central venous port tip over the SVC. Cardiomegaly with aortic atherosclerosis. No consolidation, pleural effusion or pneumothorax. Clips over the right chest. IMPRESSION: No active disease.  Cardiomegaly. Electronically Signed   By: Donavan Foil M.D.   On: 10/31/2021 18:05   DG Chest 2 View  Result Date: 10/11/2021 CLINICAL DATA:  Chest pain.  History of breast cancer and PE. EXAM: CHEST - 2 VIEW COMPARISON:  10/01/2021 FINDINGS: Cardiac enlargement. Negative for heart failure. Atherosclerotic calcification aortic arch. Port-A-Cath tip in the SVC unchanged Lungs are clear without infiltrate effusion or edema. Cardiac monitoring device overlying the left chest is new since the prior study. IMPRESSION: Cardiac enlargement.  No acute cardiopulmonary abnormality. Electronically Signed   By: Franchot Gallo M.D.   On: 10/11/2021 12:09    EKG: on arrival shows Mobitz 1 at 57 bpm, repeat shows same (personally reviewed)  TELEMETRY: NSR 60s mostly, periods of mobitz 1 as well as periods of 2:1 AV block (personally reviewed)    Assessment/Plan: 1.  Advanced AV block With symptoms of weakness, fatigue, and nausea.  Coreg previously held without relief or improvement.  Zio as above showed episodes of mobitz 1, 2:1 AV block, as well as brief periods of CHB including episodes between 4 and 5 pm.  Explained risks, benefits, and alternatives to PPM implantation, including but not limited to bleeding, infection, pneumothorax, pericardial effusion, lead dislodgement, heart attack, stroke, or death.  Pt verbalized understanding and agrees to proceed if agreed is indicated.   2. Atypical chest pain Previously presented to ED with symptoms that resolved with GI cocktail and have not re-occurred on PPI.   3.  HTN Stable on current regimen  Resume BB post pacing  4. H/o PE/DVT On eliquis since 02/2019 Hold for procedures  5. H/o Lymphoma In remission Port R chest increases risk of infection.   6. Hypokalemia K 3.4 on arrival yesterday.  Supp with 40 meq K, and repeat.   Dr. Quentin Ore to see. Would anticipate pacing today vs tomorrow. Please leave her NPO with eliquis held and IVF running.   For questions or updates, please contact [place] Please consult [email or web address] for contact info under Cardiology/STEMI.  Jacalyn Lefevre, PA-C  11/01/2021 8:38 AM

## 2021-11-01 NOTE — Discharge Instructions (Signed)
After Your Pacemaker   You have a Biotronik Pacemaker  ACTIVITY Do not lift your arm above shoulder height for 1 week after your procedure. After 7 days, you may progress as below.  You should remove your sling 24 hours after your procedure, unless otherwise instructed by your provider.     Tuesday November 08, 2021  Wednesday November 09, 2021 Thursday November 10, 2021 Friday November 11, 2021   Do not lift, push, pull, or carry anything over 10 pounds with the affected arm until 6 weeks (Tuesday December 13, 2021 ) after your procedure.   You may drive AFTER your wound check, unless you have been told otherwise by your provider.   Ask your healthcare provider when you can go back to work   INCISION/Dressing Please do not resume Eliquis until Sunday, 6/18 with the evening dose.   If large square, outer bandage is left in place, this can be removed after 24 hours from your procedure. Do not remove steri-strips or glue as below.   Monitor your Pacemaker site for redness, swelling, and drainage. Call the device clinic at [phone] if you experience these symptoms or fever/chills.  If your incision is sealed with Steri-strips or staples, you may shower 7 days after your procedure or when told by your provider. Do not remove the steri-strips or let the shower hit directly on your site. You may wash around your site with soap and water.    If you were discharged in a sling, please do not wear this during the day more than 48 hours after your surgery unless otherwise instructed. This may increase the risk of stiffness and soreness in your shoulder.   Avoid lotions, ointments, or perfumes over your incision until it is well-healed.  You may use a hot tub or a pool AFTER your wound check appointment if the incision is completely closed.  Pacemaker Alerts:  Some alerts are vibratory and others beep. These are NOT emergencies. Please call our office to let us know. If this occurs at night or on weekends, it  can wait until the next business day. Send a remote transmission.  If your device is capable of reading fluid status (for heart failure), you will be offered monthly monitoring to review this with you.   DEVICE MANAGEMENT Remote monitoring is used to monitor your pacemaker from home. This monitoring is scheduled every 91 days by our office. It allows [place] to keep an eye on the functioning of your device to ensure it is working properly. You will routinely see your Electrophysiologist annually (more often if necessary).   You should receive your ID card for your new device in 4-8 weeks. Keep this card with you at all times once received. Consider wearing a medical alert bracelet or necklace.  Your Pacemaker may be MRI compatible. This will be discussed at your next office visit/wound check.  You should avoid contact with strong electric or magnetic fields.   Do not use amateur (ham) radio equipment or electric (arc) welding torches. MP3 player headphones with magnets should not be used. Some devices are safe to use if held at least 12 inches (30 cm) from your Pacemaker. These include power tools, lawn mowers, and speakers. If you are unsure if something is safe to use, ask your health care provider.  When using your cell phone, hold it to the ear that is on the opposite side from the Pacemaker. Do not leave your cell phone in a pocket over the Pacemaker.  You may safely use electric blankets, heating pads, computers, and microwave ovens.  Call the office right away if: You have chest pain. You feel more short of breath than you have felt before. You feel more light-headed than you have felt before. Your incision starts to open up.  This information is not intended to replace advice given to you by your health care provider. Make sure you discuss any questions you have with your health care provider.

## 2021-11-01 NOTE — ED Notes (Signed)
Pt and family updated on plan of care. No concerns

## 2021-11-01 NOTE — Progress Notes (Signed)
PROGRESS NOTE    Sarah [name]  OZH:[id number] DOB: [birth year] DOA: 10/31/2021 PCP: Lajean Manes, MD   Brief Narrative: Sarah [name] is a 88 y.o. female with history of second-degree AV block type I, PE/DVT on Eliquis, CVA, hypertension, hyperlipidemia, hypothyroidism, breast cancer, angioimmunoblastic T-cell lymphoma, chronic thrombocytopenia.  Patient presented secondary to generalized weakness.  She has recently been on a ZIO monitor and was found to have evidence of high-grade AV block.  Patient noted to have bradycardia on admission with EKG significant for second-degree AV block.  Cardiology was consulted on admission with recommendations for EP consultation.  EP recommending PPM placement either today or tomorrow.   Assessment and Plan:  Symptomatic bradycardia Second-degree AV block/high-grade AV block Patient is not on AV nodal blocking agents.  Cardiology/EP was consulted on admission and currently recommend PPM placement.  Patient has agreed with plans for placement either today or tomorrow. -Follow-up EP for PPM placement -Continue telemetry  History of PE/DVT Diagnosed in October 2020. Patient is on Eliquis as an outpatient which is held secondary to need for procedure. Discussed with cardiology who recommends holding on anticoagulation at this time. -Resume anticoagulation as soon as able  History of stroke Patient is on Eliquis and Lipitor as an outpatient. -Continue Lipitor and hold Eliquis as mentioned above  Primary hypertension Somewhat uncontrolled.  Antihypertensives held to avoid bradycardia in setting of bradycardia.  Hyperlipidemia -Continue Lipitor  Hypothyroidism -Continue Synthroid 25 mcg daily  Angioimmunoblastic T-cell lymphoma Currently not on treatment.  Patient follows with oncology as an outpatient.  GERD -Continue Protonix  Chronic insomnia Patient is on Ambien as an outpatient. -Continue Ambien 5 mg  nightly  Depression -Continue Celexa  Left tibial pain No recent trauma per patient.  Patient prefers to follow as an outpatient.  If ongoing concerns, would be reasonable to obtain an x-ray to rule out fracture.  This was discussed with the patient at bedside.   DVT prophylaxis: SCDs Code Status:   Code Status: DNR Family Communication: None at bedside Disposition Plan: Discharge home likely in 24 hours if able to have PPM placed today   Consultants:  Cardiology/electrophysiology  Procedures:  None  Antimicrobials: None   Subjective: Patient reports no fatigue or dyspnea.  No chest pain.  No palpitations.  No issues overnight.  Patient feels well.  Objective: BP (!) 155/82   Pulse (!) 54   Temp 97.7 F (36.5 C) (Oral)   Resp 10   SpO2 96%   Examination:  General exam: Appears calm and comfortable Respiratory system: Clear to auscultation. Respiratory effort normal. Cardiovascular system: S1 & S2 heard, RRR. No murmurs, rubs, gallops or clicks. Gastrointestinal system: Abdomen is nondistended, soft and nontender. Normal bowel sounds heard. Central nervous system: Alert and oriented. No focal neurological deficits. Musculoskeletal: No edema. No calf tenderness.  Patient has some tenderness over anterior aspect of left tibia Skin: No cyanosis. No rashes Psychiatry: Judgement and insight appear normal. Mood & affect appropriate.    Data Reviewed: I have personally reviewed following labs and imaging studies  CBC Lab Results  Component Value Date   WBC 3.6 (L) 10/31/2021   RBC 4.22 10/31/2021   HGB 12.8 10/31/2021   HCT 39.6 10/31/2021   MCV 93.8 10/31/2021   MCH 30.3 10/31/2021   PLT 136 (L) 10/31/2021   MCHC 32.3 10/31/2021   RDW 13.9 10/31/2021   LYMPHSABS 0.6 (L) 10/05/2021   MONOABS 0.6 10/05/2021   EOSABS 0.2 10/05/2021   BASOSABS  0.0 61/95/0932     Last metabolic panel Lab Results  Component Value Date   NA 141 11/01/2021   K 3.5 11/01/2021    CL 108 11/01/2021   CO2 26 11/01/2021   BUN 16 11/01/2021   CREATININE 0.65 11/01/2021   GLUCOSE 91 11/01/2021   GFRNONAA >60 11/01/2021   GFRAA >60 02/10/2020   CALCIUM 9.4 11/01/2021   PROT 6.6 10/05/2021   ALBUMIN 4.2 10/05/2021   LABGLOB 2.7 09/05/2019   BILITOT 0.8 10/05/2021   ALKPHOS 77 10/05/2021   AST 20 10/05/2021   ALT 24 10/05/2021   ANIONGAP 7 11/01/2021    GFR: Estimated Creatinine Clearance: 38.9 mL/min (by C-G formula based on SCr of 0.65 mg/dL).  No results found for this or any previous visit (from the past 240 hour(s)).    Radiology Studies: DG Chest Port 1 View  Result Date: 10/31/2021 CLINICAL DATA:  Weakness EXAM: PORTABLE CHEST 1 VIEW COMPARISON:  10/11/2021 FINDINGS: Right-sided central venous port tip over the SVC. Cardiomegaly with aortic atherosclerosis. No consolidation, pleural effusion or pneumothorax. Clips over the right chest. IMPRESSION: No active disease.  Cardiomegaly. Electronically Signed   By: Donavan Foil M.D.   On: 10/31/2021 18:05      LOS: 0 days    Cordelia Poche, MD [facility] 11/01/2021, 12:00 PM   If 7PM-7AM, please contact night-coverage [email or web address]

## 2021-11-01 NOTE — ED Notes (Signed)
Pt in room A&O x4. Agreed to get IV in place. Denies any pain at this time. Updated on plan of care. Pt receptive to information provided.

## 2021-11-01 NOTE — ED Notes (Signed)
Pt in room resting no apparent distress chest rising and falling. Daughter at bedside.

## 2021-11-02 ENCOUNTER — Encounter (HOSPITAL_COMMUNITY): Payer: Self-pay | Admitting: Cardiology

## 2021-11-02 ENCOUNTER — Inpatient Hospital Stay (HOSPITAL_COMMUNITY): Payer: Medicare Other

## 2021-11-02 DIAGNOSIS — F32A Depression, unspecified: Secondary | ICD-10-CM

## 2021-11-02 DIAGNOSIS — I441 Atrioventricular block, second degree: Secondary | ICD-10-CM | POA: Diagnosis not present

## 2021-11-02 DIAGNOSIS — K219 Gastro-esophageal reflux disease without esophagitis: Secondary | ICD-10-CM

## 2021-11-02 DIAGNOSIS — Z8673 Personal history of transient ischemic attack (TIA), and cerebral infarction without residual deficits: Secondary | ICD-10-CM

## 2021-11-02 DIAGNOSIS — I442 Atrioventricular block, complete: Secondary | ICD-10-CM | POA: Diagnosis not present

## 2021-11-02 DIAGNOSIS — R001 Bradycardia, unspecified: Secondary | ICD-10-CM

## 2021-11-02 MED ORDER — CARVEDILOL 6.25 MG PO TABS
6.2500 mg | ORAL_TABLET | Freq: Two times a day (BID) | ORAL | Status: DC
  Administered 2021-11-02 – 2021-11-04 (×5): 6.25 mg via ORAL
  Filled 2021-11-02 (×5): qty 1

## 2021-11-02 MED ORDER — CARVEDILOL 6.25 MG PO TABS: 6.2500 mg | ORAL_TABLET | Freq: Two times a day (BID) | ORAL | 6 refills | Status: DC

## 2021-11-02 MED ORDER — AMLODIPINE BESYLATE 10 MG PO TABS
10.0000 mg | ORAL_TABLET | Freq: Every day | ORAL | Status: DC
  Administered 2021-11-02 – 2021-11-04 (×3): 10 mg via ORAL
  Filled 2021-11-02 (×3): qty 1

## 2021-11-02 MED ORDER — HYDRALAZINE HCL 50 MG PO TABS
100.0000 mg | ORAL_TABLET | Freq: Two times a day (BID) | ORAL | Status: DC
  Administered 2021-11-02 – 2021-11-04 (×5): 100 mg via ORAL
  Filled 2021-11-02 (×5): qty 2

## 2021-11-02 MED ORDER — AMLODIPINE BESYLATE 10 MG PO TABS: 10.0000 mg | ORAL_TABLET | Freq: Every day | ORAL | 6 refills | Status: DC

## 2021-11-02 MED ORDER — HYDRALAZINE HCL 100 MG PO TABS
100.0000 mg | ORAL_TABLET | Freq: Two times a day (BID) | ORAL | 6 refills | Status: DC
Start: 2021-11-02 — End: 2021-11-04

## 2021-11-02 NOTE — Progress Notes (Addendum)
RE: Sarah [name]  Date of Birth: 05-30-1932  Date: 11/02/2021  To Whom It May Concern:  Please be advised that the above-named patient will require a short-term nursing home stay - anticipated 30 days or less for rehabilitation and strengthening. The plan is for return home.

## 2021-11-02 NOTE — Assessment & Plan Note (Signed)
PPI ?

## 2021-11-02 NOTE — Assessment & Plan Note (Signed)
ambien

## 2021-11-02 NOTE — Evaluation (Signed)
Occupational Therapy Evaluation Patient Details Name: Sarah [name] MRN: [id number] DOB: 17-Nov-1932 Today's Date: 11/02/2021   History of Present Illness Pt is an 88 y/o female admitted secondary to weakness. Found to have 2nd degree heart block and is s/p pacemaker on 6/13. PMH includes HTN, PE/DVT, and lymphoma.   Clinical Impression   Pt PTA: Pt from ILF and was independent with ADL; daughter assisted with iADL tasks and shower transfers. Pt currently, with L UE pacemaker precautions. Pt performing sit to stand mix between minA and modA throughout, eventually minguardA for standing from recliner with constant/multiple cues to not use L arm, but pt still using L arm continually. Pt requires reminders to lessen use of LUE for pushing for transfers; pt set-upA to modA overall for ADL. Once standing, pt was more independent with tasks, but cues needed entire time to avoid using LUE for weight bearing. Pt ambulating 100' in hallway with minguardA with RW. Overall, pt unsafe to return home to ILF due to no carry over of skills to remember to not use LUE for pushing/pulling to avoid precautions, thus a high fall risk for any mobility. Pt requires short term rehab at this time in order to decrease chance of falls and increase independence safely. OT following acutely.     Recommendations for follow up therapy are one component of a multi-disciplinary discharge planning process, led by the attending physician.  Recommendations may be updated based on patient status, additional functional criteria and insurance authorization.   Follow Up Recommendations  Skilled nursing-short term rehab (<3 hours/day)    Assistance Recommended at Discharge Frequent or constant Supervision/Assistance  Patient can return home with the following A lot of help with walking and/or transfers;A lot of help with bathing/dressing/bathroom;Direct supervision/assist for medications management    Functional Status Assessment   Patient has had a recent decline in their functional status and demonstrates the ability to make significant improvements in function in a reasonable and predictable amount of time.  Equipment Recommendations  BSC/3in1    Recommendations for Other Services       Precautions / Restrictions Precautions Precautions: Fall;ICD/Pacemaker Precaution Comments: Reviewed pacemaker precautions with pt. Restrictions Weight Bearing Restrictions: Yes LLE Weight Bearing:  (pacemaker)      Mobility Bed Mobility Overal bed mobility: Needs Assistance Bed Mobility: Supine to Sit     Supine to sit: Supervision     General bed mobility comments: tried to use LUE requiring cues to not use    Transfers Overall transfer level: Needs assistance Equipment used: Rolling walker (2 wheels) Transfers: Sit to/from Stand Sit to Stand: Min assist, Mod assist           General transfer comment: mix between minA and modA throughout, eventually minguardA for standing from recliner with constant/multiple cues to not use L arm and pt still using L arm continually for weight bearing.      Balance Overall balance assessment: Needs assistance Sitting-balance support: Feet supported, Single extremity supported Sitting balance-Leahy Scale: Good     Standing balance support: Single extremity supported, During functional activity Standing balance-Leahy Scale: Fair Standing balance comment: standing at sink with minimal leaning,cues not to lean with LUE.                           ADL either performed or assessed with clinical judgement   ADL Overall ADL's : Needs assistance/impaired Eating/Feeding: Set up;Sitting   Grooming: Supervision/safety;Standing Grooming Details (indicate cue type  and reason): standing x2 mins at sink Upper Body Bathing: Minimal assistance;Sitting   Lower Body Bathing: Minimal assistance;Sitting/lateral leans;Sit to/from stand;Cueing for safety   Upper Body  Dressing : Moderate assistance;Sitting   Lower Body Dressing: Minimal assistance;Sitting/lateral leans;Sit to/from stand;Cueing for safety Lower Body Dressing Details (indicate cue type and reason): figure 4 at EOB, assisted with underwear for toileting Toilet Transfer: Minimal assistance;Moderate assistance Toilet Transfer Details (indicate cue type and reason): mix between minA and modA throughout, eventually minguardA for standing from recliner iwth constant/multiple cues to not use L arm and pt still using L arm continually. Toileting- Clothing Manipulation and Hygiene: Moderate assistance;Sitting/lateral lean;Sit to/from stand       Functional mobility during ADLs: Min guard;Rolling walker (2 wheels) General ADL Comments: Pt requires reminders to lessen use of LUE for pushing for transfers; pt set-upA to modA overall for ADL. Once standing, pt was more independent with tasks, but cues needed entire time to avoid using LUE for weigh tbearing.     Vision Baseline Vision/History: 0 No visual deficits Ability to See in Adequate Light: 0 Adequate Vision Assessment?: No apparent visual deficits     Perception     Praxis      Pertinent Vitals/Pain Pain Assessment Pain Assessment: Faces Faces Pain Scale: Hurts little more Pain Location: LUE Pain Descriptors / Indicators: Grimacing, Guarding Pain Intervention(s): Monitored during session, Repositioned     Hand Dominance Right   Extremity/Trunk Assessment Upper Extremity Assessment Upper Extremity Assessment: LUE deficits/detail LUE Deficits / Details: side of pacemaker; limited ROM below shoulder and no push/pull   Lower Extremity Assessment Lower Extremity Assessment: Generalized weakness;Defer to PT evaluation   Cervical / Trunk Assessment Cervical / Trunk Assessment: Normal   Communication Communication Communication: No difficulties   Cognition Arousal/Alertness: Awake/alert Behavior During Therapy: WFL for tasks  assessed/performed Overall Cognitive Status: Impaired/Different from baseline Area of Impairment: Memory, Safety/judgement, Problem solving                     Memory: Decreased recall of precautions   Safety/Judgement: Decreased awareness of safety   Problem Solving: Slow processing, Decreased initiation, Difficulty sequencing, Requires verbal cues, Requires tactile cues General Comments: A/O x3; unaware 90% of the time of her LUE pacemaker precautions with very little carry over from multiple sit to stands told not to use LUE.     General Comments  Daughter in room    Exercises     Shoulder [place] expects to be discharged to:: Private residence Living Arrangements: Alone Available Help at Discharge: Family;Friend(s);Available PRN/intermittently Type of Home: Independent living facility Home Access: Elevator     Home Layout: One level     Bathroom Shower/Tub: Hospital doctor Toilet: Handicapped height     Home Equipment: Conservation officer, nature (2 wheels);Shower seat - built in;Grab bars - toilet;Grab bars - tub/shower   Additional Comments: daughter still works and is only available on weekends to assist at this time.      Prior Functioning/Environment Prior Level of Function : Needs assist             Mobility Comments: Using RW for ambulation ADLs Comments: Daughter assists with showers        OT Problem List: Decreased activity tolerance;Impaired balance (sitting and/or standing);Decreased safety awareness;Pain;Impaired UE functional use;Decreased strength      OT Treatment/Interventions: Self-care/ADL training;Therapeutic exercise;Energy conservation;DME and/or AE instruction;Therapeutic activities;Patient/family education;Balance training    OT  Goals(Current goals can be found in the care plan section) Acute Rehab OT Goals Patient Stated Goal: to use my arm again OT Goal Formulation: With patient Time  For Goal Achievement: 11/16/21 Potential to Achieve Goals: Good ADL Goals Pt Will Perform Grooming: with set-up;standing Pt Will Perform Lower Body Dressing: with min guard assist;sit to/from stand Pt Will Perform Toileting - Clothing Manipulation and hygiene: with min guard assist;sit to/from stand Additional ADL Goal #1: Pt will perform sit to stand without using affected LUE with pacemaker precautions with <2 verbal cues with minguardA.  OT Frequency: Min 2X/week    Co-evaluation              AM-PAC OT "6 Clicks" Daily Activity     Outcome Measure Help from another person eating meals?: None Help from another person taking care of personal grooming?: A Little Help from another person toileting, which includes using toliet, bedpan, or urinal?: A Lot Help from another person bathing (including washing, rinsing, drying)?: A Lot Help from another person to put on and taking off regular upper body clothing?: A Lot Help from another person to put on and taking off regular lower body clothing?: A Lot 6 Click Score: 15   End of Session Equipment Utilized During Treatment: Rolling walker (2 wheels);Gait belt Nurse Communication: Mobility status;Other (comment) (SNF required)  Activity Tolerance: Patient tolerated treatment well;Patient limited by pain Patient left: in chair;with call bell/phone within reach;with family/visitor present  OT Visit Diagnosis: Unsteadiness on feet (R26.81);Muscle weakness (generalized) (M62.81);Pain;Other symptoms and signs involving cognitive function Pain - Right/Left: Left Pain - part of body: Arm                Time: 1610-9604 OT Time Calculation (min): 41 min Charges:  OT General Charges $OT Visit: 1 Visit OT Evaluation $OT Eval Moderate Complexity: 1 Mod OT Treatments $Self Care/Home Management : 8-22 mins $Therapeutic Activity: 8-22 mins  Jefferey Pica, OTR/L Acute Rehabilitation Services Office: [phone]   [id number] 11/02/2021,  3:18 PM

## 2021-11-02 NOTE — Progress Notes (Addendum)
Electrophysiology Rounding Note  Patient Name: Sarah [name] Date of Encounter: 11/02/2021  Primary Cardiologist: Buford Dresser, MD Electrophysiologist: New to Dr. Quentin Ore   Subjective   The patient is doing well today.  At this time, the patient denies chest pain, shortness of breath, or any new concerns.  Inpatient Medications    Scheduled Meds:  amLODipine  5 mg Oral Daily   atorvastatin  20 mg Oral QPM   Chlorhexidine Gluconate Cloth  6 each Topical Daily   citalopram  10 mg Oral Daily   hydrALAZINE  100 mg Oral TID   levothyroxine  25 mcg Oral Q0600   mupirocin ointment   Nasal BID   pantoprazole  40 mg Oral Daily   potassium chloride  40 mEq Oral Once   zolpidem  5 mg Oral QHS   Continuous Infusions:  sodium chloride 50 mL/hr at 11/01/21 1653   PRN Meds: acetaminophen **OR** acetaminophen, ondansetron (ZOFRAN) IV   Vital Signs    Vitals:   11/01/21 1840 11/01/21 2000 11/02/21 0020 11/02/21 0346  BP: (!) 169/85 (!) 130/91 (!) 127/57 (!) 151/92  Pulse: 68 83 76 68  Resp: 19 19 15 14   Temp: 98.4 F (36.9 C) 97.7 F (36.5 C) 98.3 F (36.8 C) 97.8 F (36.6 C)  TempSrc: Oral Oral Oral Oral  SpO2: 99% 98%  98%  Weight:    49.7 kg    Intake/Output Summary (Last 24 hours) at 11/02/2021 0745 Last data filed at 11/02/2021 0400 Gross per 24 hour  Intake 1360.83 ml  Output --  Net 1360.83 ml   Filed Weights   11/02/21 0346  Weight: 49.7 kg    Physical Exam    GEN- The patient is well appearing, alert and oriented x 3 today.   Head- normocephalic, atraumatic Eyes-  Sclera clear, conjunctiva pink Ears- hearing intact Oropharynx- clear Neck- supple Lungs- Clear to ausculation bilaterally, normal work of breathing Heart- Regular rate and rhythm, no murmurs, rubs or gallops GI- soft, NT, ND, + BS Extremities- no clubbing or cyanosis. No edema Skin- no rash or lesion Psych- euthymic mood, full affect Neuro- strength and sensation are  intact  Labs    CBC Recent Labs    10/31/21 1851  WBC 3.6*  HGB 12.8  HCT 39.6  MCV 93.8  PLT 185*   Basic Metabolic Panel Recent Labs    10/31/21 1851 11/01/21 1010  NA 137 141  K 3.4* 3.5  CL 101 108  CO2 27 26  GLUCOSE 92 91  BUN 22 16  CREATININE 0.69 0.65  CALCIUM 9.3 9.4   Liver Function Tests No results for input(s): "AST", "ALT", "ALKPHOS", "BILITOT", "PROT", "ALBUMIN" in the last 72 hours. No results for input(s): "LIPASE", "AMYLASE" in the last 72 hours. Cardiac Enzymes No results for input(s): "CKTOTAL", "CKMB", "CKMBINDEX", "TROPONINI" in the last 72 hours.   Telemetry    NSR, V paced, occasional A pacing ~ 70 bpm (personally reviewed)  Radiology    EP PPM/ICD IMPLANT  Result Date: 11/01/2021  CONCLUSIONS:  1.  Intermittent complete heart block, symptomatic  2.  Dual-chamber permanent pacemaker implant with left bundle area lead  3.  No early apparent complications.   DG Chest Port 1 View  Result Date: 10/31/2021 CLINICAL DATA:  Weakness EXAM: PORTABLE CHEST 1 VIEW COMPARISON:  10/11/2021 FINDINGS: Right-sided central venous port tip over the SVC. Cardiomegaly with aortic atherosclerosis. No consolidation, pleural effusion or pneumothorax. Clips over the right chest. IMPRESSION: No active  disease.  Cardiomegaly. Electronically Signed   By: Donavan Foil M.D.   On: 10/31/2021 18:05    Patient Profile     Sarah [name] is a 88 y.o. 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 Harrell Gave who we are seeing today for evaluation of symptomatic bradycardia at the request of Dr. Lonny Prude.  Assessment & Plan    1.  Advanced AV block S/p Biotronik DDD PPM 11/01/2021 by Dr. Quentin Ore Device interrogation stable.  Wound care and arm restrictions reviewed with patient and placed in  AVS.  Usual follow up in place.   2. Atypical chest pain Previously presented to ED with symptoms that resolved with GI cocktail and have not re-occurred on PPI.    3. HTN Resume coreg at 6.25 mg BID Decrease hydralazine to 100 mg BID at pt request Increase amlodipine to 10 mg daily.    4. H/o PE/DVT On eliquis since 02/2019 Hold for procedures   5. H/o Lymphoma In remission Port R chest increases risk of infection.    6. Hypokalemia Supped   For questions or updates, please contact [facility] Please consult [email or web address] for contact info under Cardiology/STEMI.  Signed, Sarah Friar, PA-C  11/02/2021, 7:45 AM   I have seen and examined this patient with Sarah [name].  Agree with above, note added to reflect my findings.  On exam, RRR, no murmurs, lungs clear.  She is now status post Biotronik pacemaker for advanced heart block.  Device functioning appropriately.  Chest x-ray and interrogation without issue.  Ready for discharge today with follow-up in device clinic. Sarah [name] have PT work with her prior to discharge as she uses a walker.  Sarah [name] M. Nakea Gouger MD 11/02/2021 8:24 AM

## 2021-11-02 NOTE — Assessment & Plan Note (Signed)
Follows with oncology outpatient

## 2021-11-02 NOTE — NC FL2 (Addendum)
[place] LEVEL OF CARE SCREENING TOOL     IDENTIFICATION  Patient Name: Sarah [name] Birthdate: [birth year] Sex: female Admission Date (Current Location): 10/31/2021  [facility] and [place] Number:  Herbalist and Address:  [facility] [place], [place] [address] [place], [place] [address]      Provider Number: [id number]  Attending Physician Name and Address:  Elodia Florence., *  Relative Name and Phone Number:  [name] (daughter)    Current Level of Care: Hospital Recommended Level of Care: [place] Prior Approval Number:    Date Approved/Denied:   PASRR Number: [id number]  Discharge Plan: SNF    Current Diagnoses: Patient Active Problem List   Diagnosis Date Noted   Pain in left tibia 11/01/2021   Mobitz type 1 second degree atrioventricular block 10/31/2021   Atrioventricular block, Mobitz type 1, Wenckebach 10/01/2021   History of pulmonary embolism 10/01/2021   Hypokalemia 10/01/2021   Thrombocytopenia ([place]) 10/01/2021   Cerebral thrombosis with cerebral infarction 05/16/2021   Headache 05/15/2021   Neuropathy 10/27/2020   Abnormal gait 06/22/2020   Age-related osteoporosis without current pathological fracture 06/22/2020   Anxiety 06/22/2020   Hardening of the aorta (main artery of the heart) ([place]) 06/22/2020   Malignant lymphoma, non-Hodgkin's ([place]) 06/22/2020   Mild cognitive disorder 06/22/2020   Non-pressure chronic ulcer of unspecified part of unspecified lower leg with unspecified severity ([place]) 06/22/2020   Primary insomnia 06/22/2020   Pulmonary nodule 06/22/2020   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   Abnormal chest x-ray 12/31/2019   Cough 12/31/2019   Port-A-Cath in place 06/04/2019   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   Counseling regarding advance care planning and goals of care 04/14/2019   Lymphadenopathy of head and neck 03/17/2019   Lymph nodes enlarged 03/17/2019   History  of pulmonary embolus (PE) 03/17/2019   Pulmonary embolism ([place]) 03/06/2019   Diffuse lymphadenopathy 03/06/2019   History of DVT (deep vein thrombosis) 02/2019   Pruritus 02/06/2019   Angio-edema 02/06/2019   Shortness of breath 02/06/2019   Osteopenia 12/18/2013   Heart block AV first degree 12/18/2013   Breast cancer of upper-inner quadrant of right female breast ([place]) 03/19/2012   Right knee DJD 08/18/2011   Hypothyroidism    Hypertension    PONV (postoperative nausea and vomiting)    Hyperlipemia     Orientation RESPIRATION BLADDER Height & Weight     Self, Time, Situation, Place (WDL)  Normal Continent, External catheter (External Urinary Catheter) Weight: 109 lb 9.6 oz (49.7 kg) Height:     BEHAVIORAL SYMPTOMS/MOOD NEUROLOGICAL BOWEL NUTRITION STATUS      Continent Diet (Please see discharge summary)  AMBULATORY STATUS COMMUNICATION OF NEEDS Skin     Verbally Other (Comment) Aeronautical engineer)                       Personal Care Assistance Level of Assistance  Bathing, Feeding, Dressing Bathing Assistance: Limited assistance Feeding assistance: Independent (Able to feed self,Carb Modified) Dressing Assistance: Limited assistance     Functional Limitations Info  Sight, Hearing, Speech Sight Info: Adequate (WDL) Hearing Info: Adequate (WDL) Speech Info: Adequate (WDL)    SPECIAL CARE FACTORS FREQUENCY  PT (By licensed PT), OT (By licensed OT)     PT Frequency: 5x min weekly OT Frequency: 5x min weekly            Contractures Contractures Info: Not present    Additional Factors Info  Code Status, Allergies, Psychotropic Code Status Info: DNR Allergies Info: Alendronate Sodium,Amoxicillin-pot Clavulanate,Codeine,Lisinopril,Septra (sulfamethoxazole-trimethoprim),Thiazide-type Diuretics,Latex Psychotropic Info: citalopram (CELEXA) tablet 10 mg daily         Current Medications (11/02/2021):  This is the current hospital active medication  list Current Facility-Administered Medications  Medication Dose Route Frequency Provider Last Rate Last Admin   0.9 %  sodium chloride infusion   Intravenous Continuous Shirley Friar, PA-C 50 mL/hr at 11/01/21 1653 Infusion Verify at 11/01/21 1653   acetaminophen (TYLENOL) tablet 650 mg  650 mg Oral Q6H PRN Shela Leff, MD   650 mg at 11/01/21 2118   Or   acetaminophen (TYLENOL) suppository 650 mg  650 mg Rectal Q6H PRN Shela Leff, MD       amLODipine (NORVASC) tablet 10 mg  10 mg Oral Daily Shirley Friar, PA-C   10 mg at 11/02/21 0933   atorvastatin (LIPITOR) tablet 20 mg  20 mg Oral QPM Shela Leff, MD   20 mg at 10/31/21 2341   carvedilol (COREG) tablet 6.25 mg  6.25 mg Oral BID WC Shirley Friar, PA-C   6.25 mg at 11/02/21 1950   Chlorhexidine Gluconate Cloth 2 % PADS 6 each  6 each Topical Daily Mariel Aloe, MD   6 each at 11/02/21 1000   citalopram (CELEXA) tablet 10 mg  10 mg Oral Daily Shela Leff, MD   10 mg at 11/02/21 0933   hydrALAZINE (APRESOLINE) tablet 100 mg  100 mg Oral BID Shirley Friar, PA-C   100 mg at 11/02/21 0933   levothyroxine (SYNTHROID) tablet 25 mcg  25 mcg Oral Q0600 Shela Leff, MD   25 mcg at 11/02/21 0513   mupirocin ointment (BACTROBAN) 2 %   Nasal BID Shirley Friar, PA-C   1 application  at 93/26/71 0932   ondansetron (ZOFRAN) injection 4 mg  4 mg Intravenous Q6H PRN Vickie Epley, MD       pantoprazole (PROTONIX) EC tablet 40 mg  40 mg Oral Daily Shela Leff, MD   40 mg at 11/02/21 0933   potassium chloride SA (KLOR-CON M) CR tablet 40 mEq  40 mEq Oral Once Shirley Friar, PA-C       zolpidem Lorrin Mais) tablet 5 mg  5 mg Oral QHS Shela Leff, MD   5 mg at 11/01/21 2119     Discharge Medications: Please see discharge summary for a list of discharge medications.  Relevant Imaging Results:  Relevant Lab Results:   Additional  Information [id number]  Milas Gain, LCSWA

## 2021-11-02 NOTE — Assessment & Plan Note (Addendum)
eliquis Continue lipitor

## 2021-11-02 NOTE — Assessment & Plan Note (Addendum)
Coreg, amlodipine, hydralazine Lasix

## 2021-11-02 NOTE — Assessment & Plan Note (Addendum)
Hx PE/DVT Oct 2020 eliquis resumed

## 2021-11-02 NOTE — Assessment & Plan Note (Signed)
Symptomatic bradycardia Hx second degree AV block type 1 S/p biotronik DDD PPM 11/01/2021 by Dr. Quentin Ore Wound care and arm restrictions discussed with patient

## 2021-11-02 NOTE — Progress Notes (Signed)
PROGRESS NOTE    Sarah [name]  POE:[id number] DOB: 04-28-1933 DOA: 10/31/2021 PCP: Lajean Manes, MD  Chief Complaint  Patient presents with   Weakness    Brief Narrative:  Sarah [name] is Bricia Taher 88 y.o. female with history of second-degree AV block type I, PE/DVT on Eliquis, CVA, hypertension, hyperlipidemia, hypothyroidism, breast cancer, angioimmunoblastic T-cell lymphoma, chronic thrombocytopenia.  Patient presented secondary to generalized weakness.  She has recently been on Martin Smeal ZIO monitor and was found to have evidence of high-grade AV block.  Patient noted to have bradycardia on admission with EKG significant for second-degree AV block.  Cardiology was consulted on admission with recommendations for EP consultation.  S/p PPM placement.   Now awaiting SNF placement.    Assessment & Plan:   Principal Problem:   High Grade AV block Active Problems:   History of venous thromboembolism   History of CVA (cerebrovascular accident)   Hypertension   Hyperlipemia   History of pulmonary embolism   Hypothyroidism   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HCC)   GERD (gastroesophageal reflux disease)   Insomnia   Depression   Pain in left tibia   Symptomatic bradycardia   Assessment and Plan: * High Grade AV block Symptomatic bradycardia Hx second degree AV block type 1 S/p biotronik DDD PPM 11/01/2021 by Dr. Quentin Ore Wound care and arm restrictions discussed with patient  History of venous thromboembolism Hx PE/DVT Oct 2020 Will resume eliquis in AM if ok with cards  History of CVA (cerebrovascular accident) Resume eliquis when ok with cards Continue lipitor  Hypertension Coreg, amlodipine, hydralazine  Hyperlipemia lipitor  Hypothyroidism synthroid  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[employer]) Follows with oncology outpatient  GERD (gastroesophageal reflux disease) PPI  Insomnia ambien  Depression celexa     DVT prophylaxis: eliquis on hold, SCD Code Status:  dnr Family Communication: daughter Disposition:   Status is: Inpatient Remains inpatient appropriate because: awaiting safe SNF d/c   Consultants:  cards  Procedures:  Pacemaker placement  Antimicrobials:  Anti-infectives (From admission, onward)    Start     Dose/Rate Route Frequency Ordered Stop   11/01/21 1245  gentamicin (GARAMYCIN) 80 mg in sodium chloride 0.9 % 500 mL irrigation        80 mg Irrigation On call 11/01/21 1233 11/01/21 1759   11/01/21 1245  vancomycin (VANCOCIN) IVPB 1000 mg/200 mL premix        1,000 mg 200 mL/hr over 60 Minutes Intravenous On call 11/01/21 1233 11/01/21 1805       Subjective: Daughter at bedside  Objective: Vitals:   11/01/21 2000 11/02/21 0020 11/02/21 0346 11/02/21 0753  BP: (!) 130/91 (!) 127/57 (!) 151/92 (!) 174/90  Pulse: 83 76 68 72  Resp: 19 15 14 12   Temp: 97.7 F (36.5 C) 98.3 F (36.8 C) 97.8 F (36.6 C) 97.7 F (36.5 C)  TempSrc: Oral Oral Oral Oral  SpO2: 98%  98% 99%  Weight:   49.7 kg     Intake/Output Summary (Last 24 hours) at 11/02/2021 1928 Last data filed at 11/02/2021 0400 Gross per 24 hour  Intake 915.83 ml  Output --  Net 915.83 ml   Filed Weights   11/02/21 0346  Weight: 49.7 kg    Examination:  General exam: Appears calm and comfortable  Respiratory system: unlabored Cardiovascular system: RRR Gastrointestinal system: Abdomen is nondistended, soft and nontender.  Central nervous system: Alert and oriented. No focal neurological deficits. Extremities: no LEE    Data Reviewed: I have personally reviewed following  labs and imaging studies  CBC: Recent Labs  Lab 10/31/21 1851  WBC 3.6*  HGB 12.8  HCT 39.6  MCV 93.8  PLT 136*    Basic Metabolic Panel: Recent Labs  Lab 10/31/21 1851 11/01/21 1010  NA 137 141  K 3.4* 3.5  CL 101 108  CO2 27 26  GLUCOSE 92 91  BUN 22 16  CREATININE 0.69 0.65  CALCIUM 9.3 9.4    GFR: Estimated Creatinine Clearance: 39.6 mL/min (by C-G  formula based on SCr of 0.65 mg/dL).  Liver Function Tests: No results for input(s): "AST", "ALT", "ALKPHOS", "BILITOT", "PROT", "ALBUMIN" in the last 168 hours.  CBG: Recent Labs  Lab 10/31/21 1545 10/31/21 1849  GLUCAP 136* 92     Recent Results (from the past 240 hour(s))  Surgical PCR screen     Status: None   Collection Time: 11/01/21  4:25 PM   Specimen: Nasal Mucosa; Nasal Swab  Result Value Ref Range Status   MRSA, PCR NEGATIVE NEGATIVE Final   Staphylococcus aureus NEGATIVE NEGATIVE Final    Comment: (NOTE) The Xpert SA Assay (FDA approved for NASAL specimens in patients 63 years of age and older), is one component of Junior Kenedy comprehensive surveillance program. It is not intended to diagnose infection nor to guide or monitor treatment. Performed at [facility], [place] [address] [place], [place] [address]          Radiology Studies: DG Chest 2 View  Result Date: 11/02/2021 CLINICAL DATA:  [id number] pacemaker placement EXAM: CHEST - 2 VIEW COMPARISON:  October 31, 2021 FINDINGS: There has been interval left subclavian dual lead pacemaker insertion with 1 of the leads in the right atrium and the second in the right ventricle. No pneumothorax. Stable right transjugular [place]-Cath. Mild cardiomegaly. No focal consolidation, pleural effusion or vascular congestion. Moderate thoracic spondylosis. IMPRESSION: There has been interval left subclavian dual lead pacemaker insertion. No pneumothorax. No active cardiopulmonary process. Electronically Signed   By: Frazier Richards M.D.   On: 11/02/2021 07:56   EP PPM/ICD IMPLANT  Result Date: 11/01/2021  CONCLUSIONS:  1.  Intermittent complete heart block, symptomatic  2.  Dual-chamber permanent pacemaker implant with left bundle area lead  3.  No early apparent complications.        Scheduled Meds:  amLODipine  10 mg Oral Daily   atorvastatin  20 mg Oral QPM   carvedilol  6.25 mg Oral BID WC   Chlorhexidine Gluconate Cloth  6  each Topical Daily   citalopram  10 mg Oral Daily   hydrALAZINE  100 mg Oral BID   levothyroxine  25 mcg Oral Q0600   mupirocin ointment   Nasal BID   pantoprazole  40 mg Oral Daily   potassium chloride  40 mEq Oral Once   zolpidem  5 mg Oral QHS   Continuous Infusions:  sodium chloride 50 mL/hr at 11/01/21 1653     LOS: 1 day    Time spent: over 30 min    Fayrene Helper, MD [facility]   To contact the attending provider between 7A-7P or the covering provider during after hours 7P-7A, please log into the web site [email or web address] and access using universal [place] password for that web site. If you do not have the password, please call the hospital operator.  11/02/2021, 7:28 PM

## 2021-11-02 NOTE — Assessment & Plan Note (Signed)
synthroid °

## 2021-11-02 NOTE — Care Management (Signed)
  Transition of Care [facility]) Screening Note   Patient Details  Name: TAYLIA BERBER Date of Birth: 11-Jan-1933   Transition of Care [facility]) CM/SW Contact:    Bethena Roys, RN Phone Number: 11/02/2021, 10:12 AM    Transition of Care Department [facility]) has reviewed the patient and no TOC needs have been identified at this time. We will continue to monitor patient advancement through interdisciplinary progression rounds. If new patient transition needs arise, please place a TOC consult.

## 2021-11-02 NOTE — Progress Notes (Signed)
PT Cancellation Note  Patient Details Name: Sarah [name] MRN: [id number] DOB: Aug 25, 1932   Cancelled Treatment:    Reason Eval/Treat Not Completed: Other (comment) Per RN, family would like to be present during PT eval. Will hold and follow up once pt family arrives to hospital.   Reuel Derby, PT, DPT  Acute Rehabilitation Services  Office: [phone]    Rudean Hitt 11/02/2021, 10:21 AM

## 2021-11-02 NOTE — Hospital Course (Addendum)
SHAGUANA LOVE is Rahaf Carbonell 88 y.o. female with history of second-degree AV block type I, PE/DVT on Eliquis, CVA, hypertension, hyperlipidemia, hypothyroidism, breast cancer, angioimmunoblastic T-cell lymphoma, chronic thrombocytopenia.  Patient presented secondary to generalized weakness.  She has recently been on Francis Doenges ZIO monitor and was found to have evidence of high-grade AV block.  Patient noted to have bradycardia on admission with EKG significant for second-degree AV block.  Cardiology was consulted on admission with recommendations for EP consultation.  S/p PPM placement.   Discharged to SNF on 6/16  See below on additional details

## 2021-11-02 NOTE — Assessment & Plan Note (Signed)
celexa

## 2021-11-02 NOTE — TOC Initial Note (Addendum)
Transition of Care [facility]) - Initial/Assessment Note    Patient Details  Name: Sarah [name] MRN: [id number] Date of Birth: 12-Aug-1932  [facility]) CM/SW Contact:    Milas Gain, [place] Phone Number: 11/02/2021, 4:06 PM  Clinical Narrative:                  Patient and patients daughter agreeable to PT/OT recommendation of SNF placement at time of dc. Permission given to CSW to fax out initial referral to [facility] and [facility]. Patients daughter reports patient is from [employer]. CSW discussed insurance authorization process and will provide SNF bed offers when available. Patients passr is pending. CSW submitted clinicals to [place] must for review.CSW will continue to follow and assist with patients dc planning needs.  Expected Discharge Plan: Skilled Nursing Facility Barriers to Discharge: Continued Medical Work up   Patient Goals and CMS Choice Patient states their goals for this hospitalization and ongoing recovery are:: SNF CMS Medicare.gov Compare Post Acute Care list provided to:: Patient Represenative (must comment) (Patient and patients daughter) Choice offered to / list presented to : Adult Children, Patient  Expected Discharge Plan and Services Expected Discharge Plan: [place] In-house Referral: Clinical Social Work     Living arrangements for the past 2 months: [place] ([facility])                                      Prior Living Arrangements/Services Living arrangements for the past 2 months: [place] ([facility]) Lives with:: Self (From [place]) Patient language and need for interpreter reviewed:: Yes Do you feel safe going back to the place where you live?: No   SNF  Need for Family Participation in Patient Care: Yes (Comment) Care giver support system in place?: Yes (comment)   Criminal Activity/Legal Involvement  Pertinent to Current Situation/Hospitalization: No - Comment as needed  Activities of Daily Living      Permission Sought/Granted Permission sought to share information with : Case Manager, Family Supports, Customer service manager Permission granted to share information with : Yes, Verbal Permission Granted  Share Information with NAME: [name]  Permission granted to share info w AGENCY: SNF  Permission granted to share info w Relationship: daughter  Permission granted to share info w Contact Information: [name] [phone]  Emotional Assessment Appearance:: Appears stated age Attitude/Demeanor/Rapport: Gracious Affect (typically observed): Calm Orientation: : Oriented to Self, Oriented to Place, Oriented to  Time, Oriented to Situation (WDL) Alcohol / Substance Use: Not Applicable Psych Involvement: No (comment)  Admission diagnosis:  Bradycardia [R00.1] Heart block [I45.9] Weakness [R53.1] Mobitz type 1 second degree atrioventricular block [I44.1] Patient Active Problem List   Diagnosis Date Noted   Pain in left tibia 11/01/2021   Mobitz type 1 second degree atrioventricular block 10/31/2021   Atrioventricular block, Mobitz type 1, Wenckebach 10/01/2021   History of pulmonary embolism 10/01/2021   Hypokalemia 10/01/2021   Thrombocytopenia ([place]) 10/01/2021   Cerebral thrombosis with cerebral infarction 05/16/2021   Headache 05/15/2021   Neuropathy 10/27/2020   Abnormal gait 06/22/2020   Age-related osteoporosis without current pathological fracture 06/22/2020   Anxiety 06/22/2020   Hardening of the aorta (main artery of the heart) ([place]) 06/22/2020   Malignant lymphoma, non-Hodgkin's ([place]) 06/22/2020   Mild cognitive disorder 06/22/2020   Non-pressure chronic ulcer of unspecified part of unspecified lower  leg with unspecified severity ([place]) 06/22/2020   Primary insomnia 06/22/2020   Pulmonary nodule 06/22/2020   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   Abnormal chest x-ray  12/31/2019   Cough 12/31/2019   Port-A-Cath in place 06/04/2019   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[place]) 04/14/2019   Counseling regarding advance care planning and goals of care 04/14/2019   Lymphadenopathy of head and neck 03/17/2019   Lymph nodes enlarged 03/17/2019   History of pulmonary embolus (PE) 03/17/2019   Pulmonary embolism ([place]) 03/06/2019   Diffuse lymphadenopathy 03/06/2019   History of DVT (deep vein thrombosis) 02/2019   Pruritus 02/06/2019   Angio-edema 02/06/2019   Shortness of breath 02/06/2019   Osteopenia 12/18/2013   Heart block AV first degree 12/18/2013   Breast cancer of upper-inner quadrant of right female breast ([place]) 03/19/2012   Right knee DJD 08/18/2011   Hypothyroidism    Hypertension    PONV (postoperative nausea and vomiting)    Hyperlipemia    PCP:  Lajean Manes, MD Pharmacy:   [facility] - [address], [place] [address] Phone: [phone] Fax: [phone]  [place], [place] [address] Phone: [phone] Fax: [phone]  [facility] [place], [place] [address] [place] [address] Phone: [phone] Fax: [phone]  [facility] [address] [place] [address] Phone: [phone] Fax: [phone]  [facility] [place], [place] [address] Phone: [phone] Fax: [phone]     Social Determinants of Health (SDOH) Interventions    Readmission Risk Interventions     No data to display

## 2021-11-02 NOTE — Assessment & Plan Note (Signed)
lipitor

## 2021-11-02 NOTE — Evaluation (Signed)
Physical Therapy Evaluation Patient Details Name: Sarah [name] MRN: [id number] DOB: 14-May-1933 Today's Date: 11/02/2021  History of Present Illness  Pt is an 88 y/o female admitted secondary to weakness. Found to have 2nd degree heart block and is s/p pacemaker on 6/13. PMH includes HTN, PE/DVT, and lymphoma.  Clinical Impression  Pt admitted secondary to problem above with deficits below. Pt with poor awareness of pacemaker precautions and required continuous cues not to push through LUE. Required mod A for standing and min to min guard A for short distance ambulation. Pt currently lives at [place] alone and will have difficulty caring for herself. Recommending SNF level therapies at d/c. Will continue to follow acutely.        Recommendations for follow up therapy are one component of a multi-disciplinary discharge planning process, led by the attending physician.  Recommendations may be updated based on patient status, additional functional criteria and insurance authorization.  Follow Up Recommendations Skilled nursing-short term rehab (<3 hours/day)    Assistance Recommended at Discharge Frequent or constant Supervision/Assistance  Patient can return home with the following  A little help with walking and/or transfers;A lot of help with bathing/dressing/bathroom;Assistance with cooking/housework;Help with stairs or ramp for entrance;Assist for transportation    Equipment Recommendations None recommended by PT  Recommendations for Other Services       Functional Status Assessment Patient has had a recent decline in their functional status and demonstrates the ability to make significant improvements in function in a reasonable and predictable amount of time.     Precautions / Restrictions Precautions Precautions: Fall;ICD/Pacemaker Precaution Comments: Reviewed pacemaker precautions with pt. Restrictions Weight Bearing Restrictions: Yes LLE Weight Bearing:  (pacemaker)       Mobility  Bed Mobility Overal bed mobility: Needs Assistance Bed Mobility: Supine to Sit, Sit to Supine     Supine to sit: Min assist Sit to supine: Min assist   General bed mobility comments: Assist for trunk. Continuous cues not to use LUE    Transfers Overall transfer level: Needs assistance Equipment used: Rolling walker (2 wheels) Transfers: Sit to/from Stand Sit to Stand: Mod assist           General transfer comment: Mod A for lift assist and steadying assist. Cues not to push through LUE    Ambulation/Gait Ambulation/Gait assistance: Min guard, Min assist Gait Distance (Feet): 75 Feet Assistive device: Rolling walker (2 wheels) Gait Pattern/deviations: Step-through pattern, Decreased stride length, Trunk flexed Gait velocity: Decreased     General Gait Details: Min guard to min A for steadying. Pt fatiguing easily. As pt fatigued, attempted to ambulate faster and required cues for safe gait speed.  Stairs            Wheelchair Mobility    Modified Rankin (Stroke Patients Only)       Balance Overall balance assessment: Needs assistance Sitting-balance support: Feet supported, Single extremity supported Sitting balance-Leahy Scale: Good     Standing balance support: Bilateral upper extremity supported Standing balance-Leahy Scale: Poor Standing balance comment: reliant on UE support                             Pertinent Vitals/Pain Pain Assessment Pain Assessment: Faces Faces Pain Scale: Hurts little more Pain Location: LUE Pain Descriptors / Indicators: Grimacing, Guarding Pain Intervention(s): Limited activity within patient's tolerance, Monitored during session, Repositioned    Home Living Family/patient expects to be discharged to:: Private residence Living  Arrangements: Alone Available Help at Discharge: Family;Friend(s);Available PRN/intermittently Type of Home: Independent living facility Home Access: Elevator        Home Layout: One level Home Equipment: Conservation officer, nature (2 wheels);Shower seat - built in;Grab bars - toilet;Grab bars - tub/shower Additional Comments: daughter still works and is only available on weekends to assist at this time.    Prior Function Prior Level of Function : Needs assist             Mobility Comments: Using RW for ambulation ADLs Comments: Daughter assists with showers     Hand Dominance   Dominant Hand: Right    Extremity/Trunk Assessment   Upper Extremity Assessment Upper Extremity Assessment: Defer to OT evaluation LUE Deficits / Details: side of pacemaker; limited ROM below shoulder and no push/pull    Lower Extremity Assessment Lower Extremity Assessment: Generalized weakness    Cervical / Trunk Assessment Cervical / Trunk Assessment: Normal  Communication   Communication: No difficulties  Cognition Arousal/Alertness: Awake/alert Behavior During Therapy: WFL for tasks assessed/performed Overall Cognitive Status: Impaired/Different from baseline Area of Impairment: Memory, Safety/judgement, Problem solving                     Memory: Decreased recall of precautions   Safety/Judgement: Decreased awareness of safety   Problem Solving: Slow processing, Decreased initiation, Difficulty sequencing, Requires verbal cues, Requires tactile cues General Comments: Required continuous cues to maintain pacemaker precautions on LUE.        General Comments General comments (skin integrity, edema, etc.): Daughter in room    Exercises     Assessment/Plan    PT Assessment Patient needs continued PT services  PT Problem List Decreased range of motion;Decreased strength;Decreased activity tolerance;Decreased balance;Decreased mobility;Decreased cognition;Decreased knowledge of use of DME;Decreased safety awareness;Decreased knowledge of precautions;Pain       PT Treatment Interventions DME instruction;Gait training;Functional mobility  training;Therapeutic activities;Therapeutic exercise;Balance training;Patient/family education    PT Goals (Current goals can be found in the Care Plan section)  Acute Rehab PT Goals Patient Stated Goal: to get better PT Goal Formulation: With patient Time For Goal Achievement: 11/16/21 Potential to Achieve Goals: Good    Frequency Min 2X/week     Co-evaluation               AM-PAC PT "6 Clicks" Mobility  Outcome Measure Help needed turning from your back to your side while in a flat bed without using bedrails?: A Little Help needed moving from lying on your back to sitting on the side of a flat bed without using bedrails?: A Little Help needed moving to and from a bed to a chair (including a wheelchair)?: A Little Help needed standing up from a chair using your arms (e.g., wheelchair or bedside chair)?: A Lot Help needed to walk in hospital room?: A Little Help needed climbing 3-5 steps with a railing? : Total 6 Click Score: 15    End of Session Equipment Utilized During Treatment: Gait belt Activity Tolerance: Patient tolerated treatment well Patient left: in bed;with call bell/phone within reach;with bed alarm set;with family/visitor present Nurse Communication: Mobility status PT Visit Diagnosis: Unsteadiness on feet (R26.81);Muscle weakness (generalized) (M62.81);Difficulty in walking, not elsewhere classified (R26.2)    Time: 4580-9983 PT Time Calculation (min) (ACUTE ONLY): 23 min   Charges:   PT Evaluation $PT Eval Moderate Complexity: 1 Mod PT Treatments $Gait Training: 8-22 mins        Reuel Derby, PT, DPT  Acute Rehabilitation Services  Office: [phone]   Rudean Hitt 11/02/2021, 3:38 PM

## 2021-11-03 DIAGNOSIS — I441 Atrioventricular block, second degree: Secondary | ICD-10-CM | POA: Diagnosis not present

## 2021-11-03 LAB — COMPREHENSIVE METABOLIC PANEL
ALT: 18 U/L (ref 0–44)
AST: 24 U/L (ref 15–41)
Albumin: 3.5 g/dL (ref 3.5–5.0)
Alkaline Phosphatase: 62 U/L (ref 38–126)
Anion gap: 8 (ref 5–15)
BUN: 15 mg/dL (ref 8–23)
CO2: 25 mmol/L (ref 22–32)
Calcium: 9.2 mg/dL (ref 8.9–10.3)
Chloride: 107 mmol/L (ref 98–111)
Creatinine, Ser: 0.6 mg/dL (ref 0.44–1.00)
GFR, Estimated: >60 mL/min (ref >60)
Glucose, Bld: 88 mg/dL (ref 70–99)
Potassium: 3.8 mmol/L (ref 3.5–5.1)
Sodium: 140 mmol/L (ref 135–145)
Total Bilirubin: 1 mg/dL (ref 0.3–1.2)
Total Protein: 5.8 g/dL — ABNORMAL LOW (ref 6.5–8.1)

## 2021-11-03 LAB — MAGNESIUM: Magnesium: 2.2 mg/dL (ref 1.7–2.4)

## 2021-11-03 LAB — CBC WITH DIFFERENTIAL/PLATELET
Abs Immature Granulocytes: 0.02 10*3/uL (ref 0.00–0.07)
Basophils Absolute: 0 10*3/uL (ref 0.0–0.1)
Basophils Relative: 1 %
Eosinophils Absolute: 0.2 10*3/uL (ref 0.0–0.5)
Eosinophils Relative: 5 %
HCT: 37.8 % (ref 36.0–46.0)
Hemoglobin: 12.2 g/dL (ref 12.0–15.0)
Immature Granulocytes: 1 %
Lymphocytes Relative: 14 %
Lymphs Abs: 0.5 10*3/uL — ABNORMAL LOW (ref 0.7–4.0)
MCH: 30.4 pg (ref 26.0–34.0)
MCHC: 32.3 g/dL (ref 30.0–36.0)
MCV: 94.3 fL (ref 80.0–100.0)
Monocytes Absolute: 0.4 10*3/uL (ref 0.1–1.0)
Monocytes Relative: 11 %
Neutro Abs: 2.5 10*3/uL (ref 1.7–7.7)
Neutrophils Relative %: 68 %
Platelets: 99 10*3/uL — ABNORMAL LOW (ref 150–400)
RBC: 4.01 MIL/uL (ref 3.87–5.11)
RDW: 13.7 % (ref 11.5–15.5)
WBC: 3.6 10*3/uL — ABNORMAL LOW (ref 4.0–10.5)
nRBC: 0 % (ref 0.0–0.2)

## 2021-11-03 LAB — PHOSPHORUS: Phosphorus: 2.5 mg/dL (ref 2.5–4.6)

## 2021-11-03 MED ORDER — APIXABAN 2.5 MG PO TABS
2.5000 mg | ORAL_TABLET | Freq: Two times a day (BID) | ORAL | Status: DC
  Administered 2021-11-03 – 2021-11-04 (×3): 2.5 mg via ORAL
  Filled 2021-11-03 (×3): qty 1

## 2021-11-03 MED ORDER — ORAL CARE MOUTH RINSE: 15.0000 mL | OROMUCOSAL | Status: DC | PRN

## 2021-11-03 NOTE — Progress Notes (Signed)
[place] for apixaban Indication: history PE/DVT  Allergies  Allergen Reactions   Alendronate Sodium Other (See Comments)    UNK reaction   Amoxicillin-Pot Clavulanate Other (See Comments)    UNK reaction   Codeine Other (See Comments)    Reaction not recalled   Lisinopril Other (See Comments)    UNK reaction   Septra [Sulfamethoxazole-Trimethoprim] Other (See Comments)   Thiazide-Type Diuretics Other (See Comments)    Blurry vision?? (patient stated she has macular degeneration)   Latex Rash    Patient Measurements: Weight: 49.7 kg (109 lb 9.6 oz)  Vital Signs: Temp: 98.1 F (36.7 C) (06/15 0519) Temp Source: Oral (06/15 0519) BP: 155/97 (06/15 1004)  Labs: Recent Labs    10/31/21 1851 10/31/21 2209 11/01/21 1010 11/03/21 0500  HGB 12.8  --   --  12.2  HCT 39.6  --   --  37.8  PLT 136*  --   --  99*  CREATININE 0.69  --  0.65 0.60  TROPONINIHS 15 14  --   --     Estimated Creatinine Clearance: 39.6 mL/min (by C-G formula based on SCr of 0.6 mg/dL).   Medical History: Past Medical History:  Diagnosis Date   Allergy    codeine, thiazides   Anxiety    new dx   Arrhythmia    Arthritis    Atrioventricular block, Mobitz type 1, Wenckebach    Breast cancer ([place]) 03/14/2012   bx=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,ER/PR=+,upper inner quad   Cancer (HCC)    breast   Cataract    DVT (deep venous thrombosis) ([place]) 02/2019   left leg   Dyspnea    Edema    both legs feet and toe, abdomen   Glaucoma    laser treated years ago   HOH (hard of hearing)    Hyperlipemia    Hypertension    Hypothyroidism    Pancreatic cyst    benign   PONV (postoperative nausea and vomiting)    Pulmonary embolism ([place]) 02/2019   bilateral    Radiation 06/11/2012-07/12/2012   17 sessions 4250 cGy, 3 sessions 750 cGy   Vertigo    Wears glasses    Wears partial dentures    partial upper    Medications:   Medications Prior to Admission  Medication Sig Dispense Refill Last Dose   acetaminophen (TYLENOL) 500 MG tablet Take 1,000 mg by mouth every 6 (six) hours as needed for moderate pain.   unk   amLODipine (NORVASC) 5 MG tablet Take 1 tablet (5 mg total) by mouth daily. Started 5 days ago (Patient taking differently: Take 5 mg by mouth daily.) 30 tablet 11 10/31/2021   atorvastatin (LIPITOR) 20 MG tablet Take 1 tablet (20 mg total) by mouth daily. (Patient taking differently: Take 20 mg by mouth every evening.) 30 tablet 3 10/30/2021   B Complex-C (SUPER B COMPLEX PO) Take 1 tablet by mouth daily.   10/31/2021   Cholecalciferol (VITAMIN D) 50 MCG (2000 UT) tablet Take 2,000 Units by mouth 2 (two) times daily.   10/31/2021   citalopram (CELEXA) 10 MG tablet Take 10 mg by mouth daily.   10/31/2021   furosemide (LASIX) 20 MG tablet Take 20-40 mg by mouth See admin instructions. 40 mg on Monday,Wednesday and Friday 20 mg on Tuesday,Thursday,Saturday and sunday   10/31/2021   levothyroxine (SYNTHROID) 25 MCG tablet Take 25 mcg by mouth daily before breakfast.    10/31/2021  lidocaine-prilocaine (EMLA) cream Apply to affected area once (Patient taking differently: Apply 1 application  topically See admin instructions. Apply to port site prior to port access) 30 g 3 unk   Multiple Vitamins-Minerals (PRESERVISION AREDS PO) Take 1 capsule by mouth 2 (two) times daily.    10/31/2021   omeprazole (PRILOSEC) 20 MG capsule Take 1 capsule (20 mg total) by mouth daily. (Patient taking differently: Take 20 mg by mouth daily. 3 pm) 30 capsule 0 10/30/2021   ondansetron (ZOFRAN) 8 MG tablet Take 1 tablet (8 mg total) by mouth 2 (two) times daily as needed. Start on the third day after chemotherapy. (Patient taking differently: Take 8 mg by mouth 2 (two) times daily as needed for nausea or vomiting. Start on the third day after chemotherapy.) 30 tablet 1 unk   Polyethyl Glycol-Propyl Glycol (SYSTANE) 0.4-0.3 % GEL ophthalmic  gel Place 1 application into both eyes daily as needed (dry eyes).   unk   sodium chloride (MURO 128) 5 % ophthalmic ointment Place 1 application  into both eyes at bedtime as needed for eye irritation.   unk   zolpidem (AMBIEN) 5 MG tablet Take 5 mg by mouth at bedtime.   10/30/2021   [DISCONTINUED] ELIQUIS 2.5 MG TABS tablet TAKE 1 TABLET TWICE A DAY (Patient taking differently: Take 2.5 mg by mouth 2 (two) times daily.) 180 tablet 1 10/31/2021 at 0900   [DISCONTINUED] hydrALAZINE (APRESOLINE) 100 MG tablet Take 1 tablet 3 times daily (Patient taking differently: Take 100 mg by mouth 3 (three) times daily.) 90 tablet 3 10/31/2021   Scheduled:   amLODipine  10 mg Oral Daily   atorvastatin  20 mg Oral QPM   carvedilol  6.25 mg Oral BID WC   Chlorhexidine Gluconate Cloth  6 each Topical Daily   citalopram  10 mg Oral Daily   hydrALAZINE  100 mg Oral BID   levothyroxine  25 mcg Oral Q0600   pantoprazole  40 mg Oral Daily   potassium chloride  40 mEq Oral Once   zolpidem  5 mg Oral QHS    Assessment: 88 yo female s/p pacemaker placement on 6/13. She is on apixaban PTA for history of PE/DVT (no history noted for re-occurrence). Pharmacy to restart apixaban -hf= 12.2, plt= 99 (low plt noted in 09/2030)  Goal of Therapy:  Monitor platelets by anticoagulation protocol: Yes   Plan:  -Resume apixaban 2.5mg  po bid -Will follow patient progress  Hildred Laser, PharmD Clinical Pharmacist **Pharmacist phone directory can now be found on [email or web address] ([email or web address]).  Listed under [place].

## 2021-11-03 NOTE — Progress Notes (Signed)
PROGRESS NOTE    Sarah [name]  TOI:[id number] DOB: 1933-05-10 DOA: 10/31/2021 PCP: Lajean Manes, MD  Chief Complaint  Patient presents with   Weakness    Brief Narrative:  Sarah [name] is Sarah [name] 88 y.o. female with history of second-degree AV block type I, PE/DVT on Eliquis, CVA, hypertension, hyperlipidemia, hypothyroidism, breast cancer, angioimmunoblastic T-cell lymphoma, chronic thrombocytopenia.  Patient presented secondary to generalized weakness.  She has recently been on [place] ZIO monitor and was found to have evidence of high-grade AV block.  Patient noted to have bradycardia on admission with EKG significant for second-degree AV block.  Cardiology was consulted on admission with recommendations for EP consultation.  S/p PPM placement.   Now awaiting SNF placement.    Assessment & Plan:   Principal Problem:   High Grade AV block Active Problems:   History of venous thromboembolism   History of CVA (cerebrovascular accident)   Hypertension   Hyperlipemia   History of pulmonary embolism   Hypothyroidism   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HCC)   GERD (gastroesophageal reflux disease)   Insomnia   Depression   Pain in left tibia   Symptomatic bradycardia   Assessment and Plan: * High Grade AV block Symptomatic bradycardia Hx second degree AV block type 1 S/p biotronik DDD PPM 11/01/2021 by Dr. Quentin Ore Wound care and arm restrictions discussed with patient  History of venous thromboembolism Hx PE/DVT Oct 2020 eliquis resumed  History of CVA (cerebrovascular accident) eliquis Continue lipitor  Hypertension Coreg, amlodipine, hydralazine  Hyperlipemia lipitor  Hypothyroidism synthroid  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[place]) Follows with oncology outpatient  GERD (gastroesophageal reflux disease) PPI  Insomnia ambien  Depression celexa  Pain in left tibia Follow outpatient, no significantly TTP on exam If pain, follow plain films     DVT  prophylaxis: eliquis Code Status: dnr Family Communication: daughter Disposition:   Status is: Inpatient Remains inpatient appropriate because: awaiting safe SNF d/c   Consultants:  cards  Procedures:  Pacemaker placement  Antimicrobials:  Anti-infectives (From admission, onward)    Start     Dose/Rate Route Frequency Ordered Stop   11/01/21 1245  gentamicin (GARAMYCIN) 80 mg in sodium chloride 0.9 % 500 mL irrigation        80 mg Irrigation On call 11/01/21 1233 11/01/21 1759   11/01/21 1245  vancomycin (VANCOCIN) IVPB 1000 mg/200 mL premix        1,000 mg 200 mL/hr over 60 Minutes Intravenous On call 11/01/21 1233 11/01/21 1805       Subjective: No complaints Daughter at bedside  Objective: Vitals:   11/02/21 1945 11/03/21 0519 11/03/21 1000 11/03/21 1004  BP: 133/80 (!) 178/86 (!) 155/97 (!) 155/97  Pulse: 69     Resp: 15  (!) 23   Temp: 97.7 F (36.5 C) 98.1 F (36.7 C)    TempSrc: Oral Oral    SpO2: 100%     Weight:        Intake/Output Summary (Last 24 hours) at 11/03/2021 1756 Last data filed at 11/03/2021 0800 Gross per 24 hour  Intake 240 ml  Output --  Net 240 ml   Filed Weights   11/02/21 0346  Weight: 49.7 kg    Examination:  General: No acute distress. Lungs: unlabored Neurological: Alert and oriented 3. Moves all extremities 4. Cranial nerves II through XII grossly intact. Extremities: No clubbing or cyanosis. No edema  Data Reviewed: I have personally reviewed following labs and imaging studies  CBC: Recent Labs  Lab 10/31/21  1851 11/03/21 0500  WBC 3.6* 3.6*  NEUTROABS  --  2.5  HGB 12.8 12.2  HCT 39.6 37.8  MCV 93.8 94.3  PLT 136* 99*    Basic Metabolic Panel: Recent Labs  Lab 10/31/21 1851 11/01/21 1010 11/03/21 0500  NA 137 141 140  K 3.4* 3.5 3.8  CL 101 108 107  CO2 27 26 25   GLUCOSE 92 91 88  BUN 22 16 15   CREATININE 0.69 0.65 0.60  CALCIUM 9.3 9.4 9.2  MG  --   --  2.2  PHOS  --   --  2.5     GFR: Estimated Creatinine Clearance: 39.6 mL/min (by C-G formula based on SCr of 0.6 mg/dL).  Liver Function Tests: Recent Labs  Lab 11/03/21 0500  AST 24  ALT 18  ALKPHOS 62  BILITOT 1.0  PROT 5.8*  ALBUMIN 3.5    CBG: Recent Labs  Lab 10/31/21 1545 10/31/21 1849  GLUCAP 136* 92     Recent Results (from the past 240 hour(s))  Surgical PCR screen     Status: None   Collection Time: 11/01/21  4:25 PM   Specimen: Nasal Mucosa; Nasal Swab  Result Value Ref Range Status   MRSA, PCR NEGATIVE NEGATIVE Final   Staphylococcus aureus NEGATIVE NEGATIVE Final    Comment: (NOTE) The Xpert SA Assay (FDA approved for NASAL specimens in patients 57 years of age and older), is one component of Jaliel Deavers comprehensive surveillance program. It is not intended to diagnose infection nor to guide or monitor treatment. Performed at [facility], [place] [address] [place], [place] [address]          Radiology Studies: DG Chest 2 View  Result Date: 11/02/2021 CLINICAL DATA:  [id number] pacemaker placement EXAM: CHEST - 2 VIEW COMPARISON:  October 31, 2021 FINDINGS: There has been interval left subclavian dual lead pacemaker insertion with 1 of the leads in the right atrium and the second in the right ventricle. No pneumothorax. Stable right transjugular Port-Jac[place]y Donaway-Cath. Mild cardiomegaly. No focal consolidation, pleural effusion or vascular congestion. Moderate thoracic spondylosis. IMPRESSION: There has been interval left subclavian dual lead pacemaker insertion. No pneumothorax. No active cardiopulmonary process. Electronically Signed   By: Frazier Richards M.D.   On: 11/02/2021 07:56        Scheduled Meds:  amLODipine  10 mg Oral Daily   apixaban  2.5 mg Oral BID   atorvastatin  20 mg Oral QPM   carvedilol  6.25 mg Oral BID WC   Chlorhexidine Gluconate Cloth  6 each Topical Daily   citalopram  10 mg Oral Daily   hydrALAZINE  100 mg Oral BID   levothyroxine  25 mcg Oral Q0600    pantoprazole  40 mg Oral Daily   potassium chloride  40 mEq Oral Once   zolpidem  5 mg Oral QHS   Continuous Infusions:  sodium chloride 50 mL/hr at 11/01/21 1653     LOS: 2 days    Time spent: over 30 min    Fayrene Helper, MD [facility]   To contact the attending provider between 7A-7P or the covering provider during after hours 7P-7A, please log into the web site [email or web address] and access using universal [place] password for that web site. If you do not have the password, please call the hospital operator.  11/03/2021, 5:56 PM

## 2021-11-03 NOTE — Assessment & Plan Note (Signed)
Follow outpatient, no significantly TTP on exam If pain, follow plain films

## 2021-11-03 NOTE — TOC Progression Note (Signed)
Transition of Care [facility]) - Progression Note    Patient Details  Name: Sarah [name] MRN: [id number] Date of Birth: 1932/08/16  [facility]) CM/SW [place], [place] Phone Number: 11/03/2021, 9:51 AM  Clinical Narrative:     Patients Passr was approved.CSW received call from [place] with [facility] who confirmed they can accept patient tomorrow for SNF placement. Patient with patients daughter accepted SNF bed offer with [facility]. Patients daughter requested to transport patient over to facility when medically ready for dc. CSW informed MD of request. MD in agreement for patients daughter to transport patient over to [facility] place when patient is medically ready for dc. CSW will continue to follow and assist with patients dc planning needs.  Expected Discharge Plan: [place] Barriers to Discharge: Continued Medical Work up  Expected Discharge Plan and Services Expected Discharge Plan: [place] In-house Referral: Clinical Social Work     Living arrangements for the past 2 months: [place] ([facility])                                       Social Determinants of Health (SDOH) Interventions    Readmission Risk Interventions     No data to display

## 2021-11-03 NOTE — Plan of Care (Signed)
  Problem: Education: Goal: Knowledge of General Education information will improve Description: Including pain rating scale, medication(s)/side effects and non-pharmacologic comfort measures Outcome: Progressing   Problem: Health Behavior/Discharge Planning: Goal: Ability to manage health-related needs will improve Outcome: Progressing   Problem: Clinical Measurements: Goal: Cardiovascular complication will be avoided Outcome: Progressing   Problem: Activity: Goal: Risk for activity intolerance will decrease Outcome: Progressing   Problem: Safety: Goal: Ability to remain free from injury will improve Outcome: Progressing   Problem: Skin Integrity: Goal: Risk for impaired skin integrity will decrease Outcome: Progressing

## 2021-11-04 DIAGNOSIS — R278 Other lack of coordination: Secondary | ICD-10-CM | POA: Diagnosis not present

## 2021-11-04 DIAGNOSIS — I441 Atrioventricular block, second degree: Secondary | ICD-10-CM | POA: Diagnosis not present

## 2021-11-04 DIAGNOSIS — R009 Unspecified abnormalities of heart beat: Secondary | ICD-10-CM | POA: Diagnosis not present

## 2021-11-04 DIAGNOSIS — I4430 Unspecified atrioventricular block: Secondary | ICD-10-CM | POA: Diagnosis present

## 2021-11-04 DIAGNOSIS — M6281 Muscle weakness (generalized): Secondary | ICD-10-CM | POA: Diagnosis not present

## 2021-11-04 DIAGNOSIS — R2681 Unsteadiness on feet: Secondary | ICD-10-CM | POA: Diagnosis not present

## 2021-11-04 LAB — CBC WITH DIFFERENTIAL/PLATELET
Abs Immature Granulocytes: 0.01 10*3/uL (ref 0.00–0.07)
Basophils Absolute: 0 10*3/uL (ref 0.0–0.1)
Basophils Relative: 1 %
Eosinophils Absolute: 0.1 10*3/uL (ref 0.0–0.5)
Eosinophils Relative: 4 %
HCT: 35.9 % — ABNORMAL LOW (ref 36.0–46.0)
Hemoglobin: 11.8 g/dL — ABNORMAL LOW (ref 12.0–15.0)
Immature Granulocytes: 0 %
Lymphocytes Relative: 17 %
Lymphs Abs: 0.5 10*3/uL — ABNORMAL LOW (ref 0.7–4.0)
MCH: 31 pg (ref 26.0–34.0)
MCHC: 32.9 g/dL (ref 30.0–36.0)
MCV: 94.2 fL (ref 80.0–100.0)
Monocytes Absolute: 0.4 10*3/uL (ref 0.1–1.0)
Monocytes Relative: 13 %
Neutro Abs: 2 10*3/uL (ref 1.7–7.7)
Neutrophils Relative %: 65 %
Platelets: 92 10*3/uL — ABNORMAL LOW (ref 150–400)
RBC: 3.81 MIL/uL — ABNORMAL LOW (ref 3.87–5.11)
RDW: 13.7 % (ref 11.5–15.5)
WBC: 3.1 10*3/uL — ABNORMAL LOW (ref 4.0–10.5)
nRBC: 0 % (ref 0.0–0.2)

## 2021-11-04 LAB — COMPREHENSIVE METABOLIC PANEL
ALT: 17 U/L (ref 0–44)
AST: 20 U/L (ref 15–41)
Albumin: 3.2 g/dL — ABNORMAL LOW (ref 3.5–5.0)
Alkaline Phosphatase: 59 U/L (ref 38–126)
Anion gap: 9 (ref 5–15)
BUN: 23 mg/dL (ref 8–23)
CO2: 24 mmol/L (ref 22–32)
Calcium: 8.9 mg/dL (ref 8.9–10.3)
Chloride: 107 mmol/L (ref 98–111)
Creatinine, Ser: 0.67 mg/dL (ref 0.44–1.00)
GFR, Estimated: >60 mL/min (ref >60)
Glucose, Bld: 86 mg/dL (ref 70–99)
Potassium: 3.7 mmol/L (ref 3.5–5.1)
Sodium: 140 mmol/L (ref 135–145)
Total Bilirubin: 0.8 mg/dL (ref 0.3–1.2)
Total Protein: 5.3 g/dL — ABNORMAL LOW (ref 6.5–8.1)

## 2021-11-04 LAB — PHOSPHORUS: Phosphorus: 3 mg/dL (ref 2.5–4.6)

## 2021-11-04 LAB — MAGNESIUM: Magnesium: 2.1 mg/dL (ref 1.7–2.4)

## 2021-11-04 MED ORDER — HYDRALAZINE HCL 100 MG PO TABS: 100.0000 mg | ORAL_TABLET | Freq: Two times a day (BID) | ORAL | 6 refills | Status: DC

## 2021-11-04 MED ORDER — HEPARIN SOD (PORK) LOCK FLUSH 100 UNIT/ML IV SOLN
500.0000 [IU] | INTRAVENOUS | Status: AC | PRN
  Administered 2021-11-04: 500 [IU]

## 2021-11-04 NOTE — Progress Notes (Signed)
Physical Therapy Treatment Patient Details Name: Sarah [name] MRN: [id number] DOB: 25-Oct-1932 Today's Date: 11/04/2021   History of Present Illness 88 y/o female admitted 6/12 with weakness. Found to have 2nd degree heart block and is s/p pacemaker 6/13. PMHx:HTN, PE/DVT, anxiety, mild cognitive impairment, neuropathy and lymphoma.    PT Comments    Pt pleasant and continues to have difficulty recalling and maintaining PPM precautions. Pt with significant difficulty with rising from sitting and with standing balance. Performed repeated standing trials and gait today with progression and education for pt and daughter throughout. D/c plan remains appropriate.    HR 82   Recommendations for follow up therapy are one component of a multi-disciplinary discharge planning process, led by the attending physician.  Recommendations may be updated based on patient status, additional functional criteria and insurance authorization.  Follow Up Recommendations  Skilled nursing-short term rehab (<3 hours/day)     Assistance Recommended at Discharge Frequent or constant Supervision/Assistance  Patient can return home with the following A little help with walking and/or transfers;A lot of help with bathing/dressing/bathroom;Assistance with cooking/housework;Help with stairs or ramp for entrance;Assist for transportation   Equipment Recommendations  None recommended by PT    Recommendations for Other Services       Precautions / Restrictions Precautions Precautions: Fall;ICD/Pacemaker Precaution Comments: Reviewed pacemaker precautions with pt. Restrictions Weight Bearing Restrictions: No     Mobility  Bed Mobility               General bed mobility comments: pt sitting EOB on arrival    Transfers Overall transfer level: Needs assistance   Transfers: Sit to/from Stand Sit to Stand: Mod assist           General transfer comment: mod assist to rise from bed without LUE  support and repeated 5 trials at chair with Rt armrest with maintained mod assist, max cues for sequence with assist for scooting and anterior translation. Pt with significant difficulty performing anterior translation and static standing even with physical assist    Ambulation/Gait Ambulation/Gait assistance: Min guard Gait Distance (Feet): 250 Feet Assistive device: Rolling walker (2 wheels) Gait Pattern/deviations: Step-through pattern, Decreased stride length, Trunk flexed   Gait velocity interpretation: 1.31 - 2.62 ft/sec, indicative of limited community ambulator   General Gait Details: pt with flexed trunk with cues for proximity to RW, posture and safety. pt able to appropriately self regulate distance   Stairs             Wheelchair Mobility    Modified Rankin (Stroke Patients Only)       Balance Overall balance assessment: Needs assistance   Sitting balance-Leahy Scale: Good Sitting balance - Comments: static sitting EOB   Standing balance support: Bilateral upper extremity supported Standing balance-Leahy Scale: Poor Standing balance comment: mod assist with static standing without UE support with posterior lean                            Cognition Arousal/Alertness: Awake/alert Behavior During Therapy: WFL for tasks assessed/performed Overall Cognitive Status: Impaired/Different from baseline Area of Impairment: Memory, Safety/judgement, Problem solving                     Memory: Decreased recall of precautions   Safety/Judgement: Decreased awareness of safety   Problem Solving: Slow processing, Decreased initiation, Difficulty sequencing, Requires verbal cues, Requires tactile cues General Comments: pt requires max cues for PPM precautions as  well as assurance for mobility due to fear of falling without bil UE support        Exercises      General Comments        Pertinent Vitals/Pain Pain Assessment Pain Assessment:  No/denies pain    Home Living                          Prior Function            PT Goals (current goals can now be found in the care plan section) Progress towards PT goals: Progressing toward goals    Frequency    Min 2X/week      PT Plan Current plan remains appropriate    Co-evaluation              AM-PAC PT "6 Clicks" Mobility   Outcome Measure  Help needed turning from your back to your side while in a flat bed without using bedrails?: A Little Help needed moving from lying on your back to sitting on the side of a flat bed without using bedrails?: A Little Help needed moving to and from a bed to a chair (including a wheelchair)?: A Little Help needed standing up from a chair using your arms (e.g., wheelchair or bedside chair)?: A Lot Help needed to walk in hospital room?: A Little Help needed climbing 3-5 steps with a railing? : Total 6 Click Score: 15    End of Session   Activity Tolerance: Patient tolerated treatment well Patient left: in chair;with call bell/phone within reach;with family/visitor present Nurse Communication: Mobility status PT Visit Diagnosis: Unsteadiness on feet (R26.81);Muscle weakness (generalized) (M62.81);Difficulty in walking, not elsewhere classified (R26.2)     Time: 2620-3559 PT Time Calculation (min) (ACUTE ONLY): 30 min  Charges:  $Gait Training: 8-22 mins $Therapeutic Activity: 8-22 mins                     Bayard Males, PT Acute Rehabilitation Services Office: [place] 11/04/2021, 11:13 AM

## 2021-11-04 NOTE — TOC Transition Note (Addendum)
Transition of Care [facility]) - CM/SW Discharge Note   Patient Details  Name: Sarah [name] MRN: [id number] Date of Birth: 1933/01/31  [facility] CM/SW Contact:  Milas Gain, [place] Phone Number: 11/04/2021, 11:05 AM   Clinical Narrative:     [facility] will [facility] to: [facility]  Anticipated DC date: 11/04/2021  Family notified: Sarah [name]  Transport by: Patients daughter Sarah [name]  ?  Per MD patient ready for DC to [facility] . RN, patient, patient's family, and facility notified of DC. Discharge Summary sent to facility. RN given number for report [phone] [id number]. DC packet on chart. Patients daughter to transport patient to [facility]  CSW signing off.   Final next level of care: Skilled Nursing Facility Barriers to Discharge: No Barriers Identified   Patient Goals and CMS Choice Patient states their goals for this hospitalization and ongoing recovery are:: SNF CMS Medicare.gov Compare Post Acute Care list provided to:: Patient Represenative (must comment) (patient,patients daughter Sarah [name]) Choice offered to / list presented to : Adult Children (patients daughter Sarah [name])  Discharge Placement              Patient [facility] bed at: [facility] Patient to be transferred to facility by: daughter Sarah [name] Name of family member notified: Sarah [name] Patient and family notified of of transfer: 11/04/21  Discharge Plan and Services In-house Referral: Clinical Social Work                                   Social Determinants of Health (SDOH) Interventions     Readmission Risk Interventions     No data to display

## 2021-11-04 NOTE — Discharge Summary (Signed)
Physician Discharge Summary  Sarah [name] MBW:[id number] DOB: 15-Feb-1933 DOA: 10/31/2021  PCP: Sarah Manes, MD  Admit date: 10/31/2021 Discharge date: 11/04/2021  Time spent: 40 minutes  Recommendations for Outpatient Follow-up:  Follow outpatient CBC/CMP  Follow with EP outpatient Will need to reschedule mammogram Follow volume status, lytes, creatinine with regards to continued lasix    Discharge Diagnoses:  Principal Problem:   High Grade AV block Active Problems:   History of venous thromboembolism   History of CVA (cerebrovascular accident)   Hypertension   Hyperlipemia   History of pulmonary embolism   Hypothyroidism   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HCC)   GERD (gastroesophageal reflux disease)   Insomnia   Depression   Pain in left tibia   Symptomatic bradycardia   AV block   Discharge Condition: stable  Diet recommendation: heart healthy  Filed Weights   11/02/21 0346  Weight: 49.7 kg    History of present illness:  Sarah [name] is Sarah [name] 88 y.o. female with history of second-degree AV block type I, PE/DVT on Eliquis, CVA, hypertension, hyperlipidemia, hypothyroidism, breast cancer, angioimmunoblastic T-cell lymphoma, chronic thrombocytopenia.  Patient presented secondary to generalized weakness.  She has recently been on Sarah [name] ZIO monitor and was found to have evidence of high-grade AV block.  Patient noted to have bradycardia on admission with EKG significant for second-degree AV block.  Cardiology was consulted on admission with recommendations for EP consultation.  S/p PPM placement.   Discharged to SNF on 6/16  See below on additional details  Hospital Course:  Assessment and Plan: * High Grade AV block Symptomatic bradycardia Hx second degree AV block type 1 S/p biotronik DDD PPM 11/01/2021 by Dr. Quentin [name] Wound care and arm restrictions discussed with patient  History of venous thromboembolism Hx PE/DVT Oct 2020 eliquis resumed  History of CVA  (cerebrovascular accident) eliquis Continue lipitor  Hypertension Coreg, amlodipine, hydralazine Lasix   Hyperlipemia lipitor  Hypothyroidism synthroid  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[place]) Follows with oncology outpatient  GERD (gastroesophageal reflux disease) PPI  Insomnia ambien  Depression celexa  Pain in left tibia Follow outpatient, no significantly TTP on exam If pain, follow plain films       Procedures: Pacemaker placement   Consultations: EP  Discharge Exam: Vitals:   11/04/21 0405 11/04/21 0900  BP: (!) 117/59 (!) 146/97  Pulse: 65 72  Resp: 14 12  Temp: 97.8 F (36.6 C) 98 F (36.7 C)  SpO2: 96% 96%   No complaints Soreness at chest at site of pacemaker  General: No acute distress. Cardiovascular: RRR Lungs: unlabored Abdomen: Soft, nontender, nondistended  Neurological: Alert and oriented 3. Moves all extremities 4 with equal strength. Cranial nerves II through XII grossly intact. Extremities: No clubbing or cyanosis. No edema.   Discharge Instructions   Discharge Instructions     Call MD for:  difficulty breathing, headache or visual disturbances   Complete by: As directed    Call MD for:  extreme fatigue   Complete by: As directed    Call MD for:  hives   Complete by: As directed    Call MD for:  persistant dizziness or light-headedness   Complete by: As directed    Call MD for:  persistant nausea and vomiting   Complete by: As directed    Call MD for:  redness, tenderness, or signs of infection (pain, swelling, redness, odor or green/yellow discharge around incision site)   Complete by: As directed    Call MD for:  severe uncontrolled  pain   Complete by: As directed    Call MD for:  temperature >100.4   Complete by: As directed    Diet - low sodium heart healthy   Complete by: As directed    Discharge instructions   Complete by: As directed    You were admitted for an abnormal heart rhythm.  You've now had Sarah [name]  pacemaker placed by the electrophysiologist.  Follow up with the EP doctors and cardiologist as recommended.  You should reschedule your mammogram (they recommend waiting 6-8 weeks after pacemaker placement).  Follow up with your PCP outpatient for repeat labs within Sarah [name] week.   Return for new, recurrent, or worsening symptoms.  Please ask your PCP to request records from this hospitalization so they know what was done and what the next steps will be.   Discharge wound care:   Complete by: As directed    Per EP   Increase activity slowly   Complete by: As directed       Allergies as of 11/04/2021       Reactions   Alendronate Sodium Other (See Comments)   UNK reaction   Amoxicillin-pot Clavulanate Other (See Comments)   UNK reaction   Codeine Other (See Comments)   Reaction not recalled   Lisinopril Other (See Comments)   UNK reaction   Septra [sulfamethoxazole-trimethoprim] Other (See Comments)   Thiazide-type Diuretics Other (See Comments)   Blurry vision?? (patient stated she has macular degeneration)   Latex Rash        Medication List     TAKE these medications    acetaminophen 500 MG tablet Commonly known as: TYLENOL Take 1,000 mg by mouth every 6 (six) hours as needed for moderate pain.   amLODipine 10 MG tablet Commonly known as: NORVASC Take 1 tablet (10 mg total) by mouth daily. What changed:  medication strength how much to take additional instructions   apixaban 2.5 MG Tabs tablet Commonly known as: Eliquis Take 1 tablet (2.5 mg total) by mouth 2 (two) times daily. Resume Sunday with the evening dose. Start taking on: November 06, 2021 What changed:  how much to take additional instructions These instructions start on November 06, 2021. If you are unsure what to do until then, ask your doctor or other care provider.   atorvastatin 20 MG tablet Commonly known as: LIPITOR Take 1 tablet (20 mg total) by mouth daily. What changed: when to take this    carvedilol 6.25 MG tablet Commonly known as: COREG Take 1 tablet (6.25 mg total) by mouth 2 (two) times daily with Sarah [name] meal.   citalopram 10 MG tablet Commonly known as: CELEXA Take 10 mg by mouth daily.   furosemide 20 MG tablet Commonly known as: LASIX Take 20-40 mg by mouth See admin instructions. 40 mg on Monday,Wednesday and Friday 20 mg on Tuesday,Thursday,Saturday and sunday   hydrALAZINE 100 MG tablet Commonly known as: APRESOLINE Take 1 tablet (100 mg total) by mouth 2 (two) times daily. What changed:  how much to take how to take this when to take this additional instructions   levothyroxine 25 MCG tablet Commonly known as: SYNTHROID Take 25 mcg by mouth daily before breakfast.   lidocaine-prilocaine cream Commonly known as: EMLA Apply to affected area once What changed:  how much to take how to take this when to take this additional instructions   omeprazole 20 MG capsule Commonly known as: PRILOSEC Take 1 capsule (20 mg total) by mouth daily. What changed: additional  instructions   ondansetron 8 MG tablet Commonly known as: Zofran Take 1 tablet (8 mg total) by mouth 2 (two) times daily as needed. Start on the third day after chemotherapy. What changed: reasons to take this   PRESERVISION AREDS PO Take 1 capsule by mouth 2 (two) times daily.   sodium chloride 5 % ophthalmic ointment Commonly known as: MURO 767 Place 1 application  into both eyes at bedtime as needed for eye irritation.   SUPER B COMPLEX PO Take 1 tablet by mouth daily.   Systane 0.4-0.3 % Gel ophthalmic gel Generic drug: Polyethyl Glycol-Propyl Glycol Place 1 application into both eyes daily as needed (dry eyes).   Vitamin D 50 MCG (2000 UT) tablet Take 2,000 Units by mouth 2 (two) times daily.   zolpidem 5 MG tablet Commonly known as: AMBIEN Take 5 mg by mouth at bedtime.               Discharge Care Instructions  (From admission, onward)           Start      Ordered   11/04/21 0000  Discharge wound care:       Comments: Per EP   11/04/21 1022           Allergies  Allergen Reactions   Alendronate Sodium Other (See Comments)    UNK reaction   Amoxicillin-Pot Clavulanate Other (See Comments)    UNK reaction   Codeine Other (See Comments)    Reaction not recalled   Lisinopril Other (See Comments)    UNK reaction   Septra [Sulfamethoxazole-Trimethoprim] Other (See Comments)   Thiazide-Type Diuretics Other (See Comments)    Blurry vision?? (patient stated she has macular degeneration)   Latex Rash      The results of significant diagnostics from this hospitalization (including imaging, microbiology, ancillary and laboratory) are listed below for reference.    Significant Diagnostic Studies: DG Chest 2 View  Result Date: 11/02/2021 CLINICAL DATA:  [id number] pacemaker placement EXAM: CHEST - 2 VIEW COMPARISON:  October 31, 2021 FINDINGS: There has been interval left subclavian dual lead pacemaker insertion with 1 of the leads in the right atrium and the second in the right ventricle. No pneumothorax. Stable right transjugular Port-Kippy Melena-Cath. Mild cardiomegaly. No focal consolidation, pleural effusion or vascular congestion. Moderate thoracic spondylosis. IMPRESSION: There has been interval left subclavian dual lead pacemaker insertion. No pneumothorax. No active cardiopulmonary process. Electronically Signed   By: Frazier Richards M.D.   On: 11/02/2021 07:56   EP PPM/ICD IMPLANT  Result Date: 11/01/2021  CONCLUSIONS:  1.  Intermittent complete heart block, symptomatic  2.  Dual-chamber permanent pacemaker implant with left bundle area lead  3.  No early apparent complications.   DG Chest Port 1 View  Result Date: 10/31/2021 CLINICAL DATA:  Weakness EXAM: PORTABLE CHEST 1 VIEW COMPARISON:  10/11/2021 FINDINGS: Right-sided central venous port tip over the SVC. Cardiomegaly with aortic atherosclerosis. No consolidation, pleural effusion or  pneumothorax. Clips over the right chest. IMPRESSION: No active disease.  Cardiomegaly. Electronically Signed   By: Donavan Foil M.D.   On: 10/31/2021 18:05   DG Chest 2 View  Result Date: 10/11/2021 CLINICAL DATA:  Chest pain.  History of breast cancer and PE. EXAM: CHEST - 2 VIEW COMPARISON:  10/01/2021 FINDINGS: Cardiac enlargement. Negative for heart failure. Atherosclerotic calcification aortic arch. Port-Barclay Lennox-Cath tip in the SVC unchanged Lungs are clear without infiltrate effusion or edema. Cardiac monitoring device overlying the left chest is new  since the prior study. IMPRESSION: Cardiac enlargement.  No acute cardiopulmonary abnormality. Electronically Signed   By: Franchot Gallo M.D.   On: 10/11/2021 12:09    Microbiology: Recent Results (from the past 240 hour(s))  Surgical PCR screen     Status: None   Collection Time: 11/01/21  4:25 PM   Specimen: Nasal Mucosa; Nasal Swab  Result Value Ref Range Status   MRSA, PCR NEGATIVE NEGATIVE Final   Staphylococcus aureus NEGATIVE NEGATIVE Final    Comment: (NOTE) The Xpert SA Assay (FDA approved for NASAL specimens in patients 77 years of age and older), is one component of Emma Birchler comprehensive surveillance program. It is not intended to diagnose infection nor to guide or monitor treatment. Performed at [facility], [employer] [address], [place], [place] [address]      Labs: Basic Metabolic Panel: Recent Labs  Lab 10/31/21 1851 11/01/21 1010 11/03/21 0500 11/04/21 0556  NA 137 141 140 140  K 3.4* 3.5 3.8 3.7  CL 101 108 107 107  CO2 27 26 25 24   GLUCOSE 92 91 88 86  BUN 22 16 15 23   CREATININE 0.69 0.65 0.60 0.67  CALCIUM 9.3 9.4 9.2 8.9  MG  --   --  2.2 2.1  PHOS  --   --  2.5 3.0   Liver Function Tests: Recent Labs  Lab 11/03/21 0500 11/04/21 0556  AST 24 20  ALT 18 17  ALKPHOS 62 59  BILITOT 1.0 0.8  PROT 5.8* 5.3*  ALBUMIN 3.5 3.2*   No results for input(s): "LIPASE", "AMYLASE" in the last 168 hours. No  results for input(s): "AMMONIA" in the last 168 hours. CBC: Recent Labs  Lab 10/31/21 1851 11/03/21 0500 11/04/21 0648  WBC 3.6* 3.6* 3.1*  NEUTROABS  --  2.5 2.0  HGB 12.8 12.2 11.8*  HCT 39.6 37.8 35.9*  MCV 93.8 94.3 94.2  PLT 136* 99* 92*   Cardiac Enzymes: No results for input(s): "CKTOTAL", "CKMB", "CKMBINDEX", "TROPONINI" in the last 168 hours. BNP: BNP (last 3 results) No results for input(s): "BNP" in the last 8760 hours.  ProBNP (last 3 results) No results for input(s): "PROBNP" in the last 8760 hours.  CBG: Recent Labs  Lab 10/31/21 1545 10/31/21 1849  GLUCAP 136* 92       Signed:  Fayrene Helper MD.  [facility] 11/04/2021, 10:31 AM

## 2021-11-04 NOTE — Care Management Important Message (Signed)
Important Message  Patient Details  Name: Sarah [name] MRN: [id number] Date of Birth: January 29, 1933   Medicare Important Message Given:  Yes     Orbie Pyo 11/04/2021, 2:08 PM

## 2021-11-08 DIAGNOSIS — R928 Other abnormal and inconclusive findings on diagnostic imaging of breast: Secondary | ICD-10-CM | POA: Diagnosis not present

## 2021-11-08 DIAGNOSIS — R922 Inconclusive mammogram: Secondary | ICD-10-CM | POA: Diagnosis not present

## 2021-11-09 ENCOUNTER — Other Ambulatory Visit: Payer: Self-pay

## 2021-11-09 NOTE — Patient Outreach (Signed)
[place] [facility]) Care Management  11/09/2021  Sarah [name] 10-12-1932 [id number]  [place] [facility] [ACO] Patient: Medicare ACO REACH  Primary Care Provider:  Lajean Manes, MD, is with Sadie Haber at [facility],  is an Independent  provider with a Chronic Care Management team and program, and is listed for the transition of care follow up and appointments.  Patient was screened for quality team request for readmission review. Patient transitioned to SNF  Plan: Information sent.  Please contact for further questions,  Natividad Brood, RN BSN [facility] Liaison  [phone] business mobile phone Toll free office [phone]  Fax number: [phone] [email or web address]

## 2021-11-11 ENCOUNTER — Ambulatory Visit (INDEPENDENT_AMBULATORY_CARE_PROVIDER_SITE_OTHER): Payer: Medicare Other | Admitting: Student

## 2021-11-11 DIAGNOSIS — I442 Atrioventricular block, complete: Secondary | ICD-10-CM

## 2021-11-11 LAB — CUP PACEART INCLINIC DEVICE CHECK
Date Time Interrogation Session: 20230623102106
Implantable Lead Implant Date: 20230613
Implantable Lead Implant Date: 20230613
Implantable Lead Location: 753859
Implantable Lead Location: 753860
Implantable Lead Model: 377
Implantable Lead Model: 377
Implantable Lead Serial Number: 8000826615
Implantable Lead Serial Number: 8000886802
Implantable Pulse Generator Implant Date: 20230613
Pulse Gen Model: 407145
Pulse Gen Serial Number: 70422578

## 2021-11-12 DIAGNOSIS — R42 Dizziness and giddiness: Secondary | ICD-10-CM | POA: Diagnosis not present

## 2021-11-12 DIAGNOSIS — I441 Atrioventricular block, second degree: Secondary | ICD-10-CM | POA: Diagnosis not present

## 2021-11-12 DIAGNOSIS — I10 Essential (primary) hypertension: Secondary | ICD-10-CM | POA: Diagnosis not present

## 2021-11-12 DIAGNOSIS — Z853 Personal history of malignant neoplasm of breast: Secondary | ICD-10-CM | POA: Diagnosis not present

## 2021-11-12 DIAGNOSIS — F419 Anxiety disorder, unspecified: Secondary | ICD-10-CM | POA: Diagnosis not present

## 2021-11-12 DIAGNOSIS — Z86711 Personal history of pulmonary embolism: Secondary | ICD-10-CM | POA: Diagnosis not present

## 2021-11-12 DIAGNOSIS — H9190 Unspecified hearing loss, unspecified ear: Secondary | ICD-10-CM | POA: Diagnosis not present

## 2021-11-12 DIAGNOSIS — M1990 Unspecified osteoarthritis, unspecified site: Secondary | ICD-10-CM | POA: Diagnosis not present

## 2021-11-12 DIAGNOSIS — K219 Gastro-esophageal reflux disease without esophagitis: Secondary | ICD-10-CM | POA: Diagnosis not present

## 2021-11-12 DIAGNOSIS — H409 Unspecified glaucoma: Secondary | ICD-10-CM | POA: Diagnosis not present

## 2021-11-12 DIAGNOSIS — Z8673 Personal history of transient ischemic attack (TIA), and cerebral infarction without residual deficits: Secondary | ICD-10-CM | POA: Diagnosis not present

## 2021-11-12 DIAGNOSIS — F32A Depression, unspecified: Secondary | ICD-10-CM | POA: Diagnosis not present

## 2021-11-12 DIAGNOSIS — F5101 Primary insomnia: Secondary | ICD-10-CM | POA: Diagnosis not present

## 2021-11-12 DIAGNOSIS — K862 Cyst of pancreas: Secondary | ICD-10-CM | POA: Diagnosis not present

## 2021-11-12 DIAGNOSIS — E039 Hypothyroidism, unspecified: Secondary | ICD-10-CM | POA: Diagnosis not present

## 2021-11-12 DIAGNOSIS — E785 Hyperlipidemia, unspecified: Secondary | ICD-10-CM | POA: Diagnosis not present

## 2021-11-12 DIAGNOSIS — M79605 Pain in left leg: Secondary | ICD-10-CM | POA: Diagnosis not present

## 2021-11-12 DIAGNOSIS — Z86718 Personal history of other venous thrombosis and embolism: Secondary | ICD-10-CM | POA: Diagnosis not present

## 2021-11-12 DIAGNOSIS — I499 Cardiac arrhythmia, unspecified: Secondary | ICD-10-CM | POA: Diagnosis not present

## 2021-11-12 DIAGNOSIS — Z48812 Encounter for surgical aftercare following surgery on the circulatory system: Secondary | ICD-10-CM | POA: Diagnosis not present

## 2021-11-12 DIAGNOSIS — Z96653 Presence of artificial knee joint, bilateral: Secondary | ICD-10-CM | POA: Diagnosis not present

## 2021-11-12 DIAGNOSIS — D696 Thrombocytopenia, unspecified: Secondary | ICD-10-CM | POA: Diagnosis not present

## 2021-11-12 DIAGNOSIS — I442 Atrioventricular block, complete: Secondary | ICD-10-CM | POA: Diagnosis not present

## 2021-11-12 DIAGNOSIS — Z950 Presence of cardiac pacemaker: Secondary | ICD-10-CM | POA: Diagnosis not present

## 2021-11-12 DIAGNOSIS — C865 Angioimmunoblastic T-cell lymphoma: Secondary | ICD-10-CM | POA: Diagnosis not present

## 2021-11-15 DIAGNOSIS — I441 Atrioventricular block, second degree: Secondary | ICD-10-CM | POA: Diagnosis not present

## 2021-11-15 DIAGNOSIS — E039 Hypothyroidism, unspecified: Secondary | ICD-10-CM | POA: Diagnosis not present

## 2021-11-15 DIAGNOSIS — I10 Essential (primary) hypertension: Secondary | ICD-10-CM | POA: Diagnosis not present

## 2021-11-15 DIAGNOSIS — I442 Atrioventricular block, complete: Secondary | ICD-10-CM | POA: Diagnosis not present

## 2021-11-15 DIAGNOSIS — Z48812 Encounter for surgical aftercare following surgery on the circulatory system: Secondary | ICD-10-CM | POA: Diagnosis not present

## 2021-11-15 DIAGNOSIS — E785 Hyperlipidemia, unspecified: Secondary | ICD-10-CM | POA: Diagnosis not present

## 2021-11-16 DIAGNOSIS — R110 Nausea: Secondary | ICD-10-CM | POA: Diagnosis not present

## 2021-11-16 DIAGNOSIS — Z950 Presence of cardiac pacemaker: Secondary | ICD-10-CM | POA: Diagnosis not present

## 2021-11-16 DIAGNOSIS — I442 Atrioventricular block, complete: Secondary | ICD-10-CM | POA: Diagnosis not present

## 2021-11-18 DIAGNOSIS — Z48812 Encounter for surgical aftercare following surgery on the circulatory system: Secondary | ICD-10-CM | POA: Diagnosis not present

## 2021-11-18 DIAGNOSIS — E785 Hyperlipidemia, unspecified: Secondary | ICD-10-CM | POA: Diagnosis not present

## 2021-11-18 DIAGNOSIS — I441 Atrioventricular block, second degree: Secondary | ICD-10-CM | POA: Diagnosis not present

## 2021-11-18 DIAGNOSIS — E039 Hypothyroidism, unspecified: Secondary | ICD-10-CM | POA: Diagnosis not present

## 2021-11-18 DIAGNOSIS — I10 Essential (primary) hypertension: Secondary | ICD-10-CM | POA: Diagnosis not present

## 2021-11-18 DIAGNOSIS — I442 Atrioventricular block, complete: Secondary | ICD-10-CM | POA: Diagnosis not present

## 2021-11-24 DIAGNOSIS — I10 Essential (primary) hypertension: Secondary | ICD-10-CM | POA: Diagnosis not present

## 2021-11-24 DIAGNOSIS — Z48812 Encounter for surgical aftercare following surgery on the circulatory system: Secondary | ICD-10-CM | POA: Diagnosis not present

## 2021-11-24 DIAGNOSIS — E039 Hypothyroidism, unspecified: Secondary | ICD-10-CM | POA: Diagnosis not present

## 2021-11-24 DIAGNOSIS — E785 Hyperlipidemia, unspecified: Secondary | ICD-10-CM | POA: Diagnosis not present

## 2021-11-24 DIAGNOSIS — I442 Atrioventricular block, complete: Secondary | ICD-10-CM | POA: Diagnosis not present

## 2021-11-24 DIAGNOSIS — I441 Atrioventricular block, second degree: Secondary | ICD-10-CM | POA: Diagnosis not present

## 2021-11-30 ENCOUNTER — Other Ambulatory Visit: Payer: Self-pay | Admitting: Geriatric Medicine

## 2021-11-30 DIAGNOSIS — R110 Nausea: Secondary | ICD-10-CM

## 2021-11-30 DIAGNOSIS — I10 Essential (primary) hypertension: Secondary | ICD-10-CM | POA: Diagnosis not present

## 2021-11-30 DIAGNOSIS — F411 Generalized anxiety disorder: Secondary | ICD-10-CM | POA: Diagnosis not present

## 2021-12-01 ENCOUNTER — Ambulatory Visit
Admission: RE | Admit: 2021-12-01 | Discharge: 2021-12-01 | Disposition: A | Discharge status: Home / Self Care | Payer: Medicare Other | Source: Ambulatory Visit | Attending: Geriatric Medicine | Admitting: Geriatric Medicine

## 2021-12-01 DIAGNOSIS — R110 Nausea: Secondary | ICD-10-CM | POA: Diagnosis not present

## 2021-12-02 DIAGNOSIS — I10 Essential (primary) hypertension: Secondary | ICD-10-CM | POA: Diagnosis not present

## 2021-12-02 DIAGNOSIS — I442 Atrioventricular block, complete: Secondary | ICD-10-CM | POA: Diagnosis not present

## 2021-12-02 DIAGNOSIS — I441 Atrioventricular block, second degree: Secondary | ICD-10-CM | POA: Diagnosis not present

## 2021-12-02 DIAGNOSIS — Z48812 Encounter for surgical aftercare following surgery on the circulatory system: Secondary | ICD-10-CM | POA: Diagnosis not present

## 2021-12-02 DIAGNOSIS — E785 Hyperlipidemia, unspecified: Secondary | ICD-10-CM | POA: Diagnosis not present

## 2021-12-02 DIAGNOSIS — E039 Hypothyroidism, unspecified: Secondary | ICD-10-CM | POA: Diagnosis not present

## 2021-12-06 ENCOUNTER — Ambulatory Visit (INDEPENDENT_AMBULATORY_CARE_PROVIDER_SITE_OTHER): Payer: Medicare Other | Admitting: Family Medicine

## 2021-12-06 ENCOUNTER — Encounter: Payer: Self-pay | Admitting: Family Medicine

## 2021-12-06 VITALS — BP 120/63 | HR 66 | Ht 64.0 in | Wt 111.0 lb

## 2021-12-06 DIAGNOSIS — I6330 Cerebral infarction due to thrombosis of unspecified cerebral artery: Secondary | ICD-10-CM | POA: Diagnosis not present

## 2021-12-06 DIAGNOSIS — I4430 Unspecified atrioventricular block: Secondary | ICD-10-CM

## 2021-12-06 DIAGNOSIS — R413 Other amnesia: Secondary | ICD-10-CM

## 2021-12-06 NOTE — Progress Notes (Signed)
Chief Complaint  Patient presents with   Follow-up    Pt with daughter, rm 1. Here for follow up visit. Overall doing well.     HISTORY OF PRESENT ILLNESS:  12/06/21 ALL:  Sarah [name] is a 88 y.o. female here today for follow up for cerebral thrombosis 05/14/2021. She started having generalized weakness and chest pain 10/31/2021. She underwent pacemaker insertion 11/01/2021 due to complete heart block. Since, she is doing well. BP readings have been much better over the past few weeks. She is followed regularly but PCP and cardiology. She is scheduled to see Dr Irene Limbo, oncology, tomorrow. Her daughter does continue to note some forgetfulness but feels she is doing well, overall. She lives at [facility]. She continues to use her walker. No falls.    HISTORY (copied from my previous note)  Sarah [name] is a 88 y.o. who  has a past medical history of Allergy, Anxiety, Arthritis, Breast cancer ([place]) (03/14/12), Cancer ([place]), Cataract, DVT (deep venous thrombosis) ([place]) (02/2019), Dyspnea, Edema, Glaucoma, HOH (hard of hearing), Hyperlipemia, Hypertension, Hypothyroidism, Pancreatic cyst, PONV (postoperative nausea and vomiting), Pulmonary embolism ([place]) (02/2019), Radiation (06/11/2012-07/12/2012), Vertigo, Wears glasses, and Wears partial dentures.  Patient presented on 05/14/2021 with an intractable headache. Initial evaluation at [facility] where CT angio showed 23mm right ICA aneurysm. Headache completely resolved with migraine cocktail and she was planning discharge. Unfortunately, her headache worsened while in the ER and BP was elevated (initially 156/90 then increasing to 200 SBP). She was transferred to [facility] for MRI which showed changes consistent with PRES. Personally reviewed hospitalization pertinent progress notes, lab work and imaging.  Evaluated by Dr Erlinda Hong.    She presents with her daughter, Sarah [name], today who aids in history. Sarah [name] is doing well, today. She feels that she is  back to baseline. She is more tired than prior to event but, overall, doing well. She has seen Dr Felipa Eth, PCP, in follow up. BP is being checked at home. Readings range from 113 sys to 150-160 sys. Dr Felipa Eth is following up with her in July. She continues hydralazine 50mg  TID and carvedilol 25mg  BID. She denies headaches. No dizziness or vision changes. Her daughter is concerned that she may be more forgetful, recently. She had some difficulty before her hospitalization but it may be a little worse since coming home. Gait is stable. She uses a walker. She lives alone. She has help stepping into and out of the shower. She is able to get dressed and bathed independently. Her daughter doses medications weekly. She is able to take medications from pill caddy appropriately. She does not drive due to peripheral neuropathy. No significant family history of dementia.    REVIEW OF SYSTEMS: Out of a complete 14 system review of symptoms, the patient complains only of the following symptoms, and all other reviewed systems are negative.   ALLERGIES: Allergies  Allergen Reactions   Alendronate Sodium Other (See Comments)    UNK reaction   Amoxicillin-Pot Clavulanate Other (See Comments)    UNK reaction   Codeine Other (See Comments)    Reaction not recalled   Lisinopril Other (See Comments)    UNK reaction   Septra [Sulfamethoxazole-Trimethoprim] Other (See Comments)   Thiazide-Type Diuretics Other (See Comments)    Blurry vision?? (patient stated she has macular degeneration)   Latex Rash     HOME MEDICATIONS: Outpatient Medications Prior to Visit  Medication Sig Dispense Refill   acetaminophen (TYLENOL) 500 MG tablet Take 1,000 mg  by mouth every 6 (six) hours as needed for moderate pain.     amLODipine (NORVASC) 10 MG tablet Take 1 tablet (10 mg total) by mouth daily. 30 tablet 6   apixaban (ELIQUIS) 2.5 MG TABS tablet Take 1 tablet (2.5 mg total) by mouth 2 (two) times daily. Resume Sunday  with the evening dose. 180 tablet 1   atorvastatin (LIPITOR) 20 MG tablet Take 1 tablet (20 mg total) by mouth daily. (Patient taking differently: Take 20 mg by mouth every evening.) 30 tablet 3   carvedilol (COREG) 6.25 MG tablet Take 1 tablet (6.25 mg total) by mouth 2 (two) times daily with a meal. 60 tablet 6   citalopram (CELEXA) 10 MG tablet Take 10 mg by mouth daily.     furosemide (LASIX) 20 MG tablet Take 20-40 mg by mouth See admin instructions. 40 mg on Monday,Wednesday and Friday 20 mg on Tuesday,Thursday,Saturday and sunday     hydrALAZINE (APRESOLINE) 100 MG tablet Take 1 tablet (100 mg total) by mouth 2 (two) times daily. (Patient taking differently: Take 100 mg by mouth 2 (two) times daily as needed.) 60 tablet 6   levothyroxine (SYNTHROID) 25 MCG tablet Take 25 mcg by mouth daily before breakfast.      lidocaine-prilocaine (EMLA) cream Apply to affected area once (Patient taking differently: Apply 1 application  topically See admin instructions. Apply to port site prior to port access) 30 g 3   Multiple Vitamins-Minerals (PRESERVISION AREDS PO) Take 1 capsule by mouth 2 (two) times daily.      omeprazole (PRILOSEC) 20 MG capsule Take 1 capsule (20 mg total) by mouth daily. (Patient taking differently: Take 20 mg by mouth daily as needed (stomach).) 30 capsule 0   Polyethyl Glycol-Propyl Glycol (SYSTANE) 0.4-0.3 % GEL ophthalmic gel Place 1 application into both eyes daily as needed (dry eyes).     sodium chloride (MURO 128) 5 % ophthalmic ointment Place 1 application  into both eyes at bedtime as needed for eye irritation.     zolpidem (AMBIEN) 5 MG tablet Take 5 mg by mouth at bedtime.     B Complex-C (SUPER B COMPLEX PO) Take 1 tablet by mouth daily.     Cholecalciferol (VITAMIN D) 50 MCG (2000 UT) tablet Take 2,000 Units by mouth 2 (two) times daily.     ondansetron (ZOFRAN) 8 MG tablet Take 1 tablet (8 mg total) by mouth 2 (two) times daily as needed. Start on the third day after  chemotherapy. (Patient taking differently: Take 8 mg by mouth 2 (two) times daily as needed for nausea or vomiting. Start on the third day after chemotherapy.) 30 tablet 1   No facility-administered medications prior to visit.     PAST MEDICAL HISTORY: Past Medical History:  Diagnosis Date   Allergy    codeine, thiazides   Anxiety    new dx   Arrhythmia    Arthritis    Atrioventricular block, Mobitz type 1, Wenckebach    Breast cancer ([place]) 03/14/2012   bx=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,ER/PR=+,upper inner quad   Cancer (HCC)    breast   Cataract    DVT (deep venous thrombosis) ([place]) 02/2019   left leg   Dyspnea    Edema    both legs feet and toe, abdomen   Glaucoma    laser treated years ago   HOH (hard of hearing)    Hyperlipemia    Hypertension    Hypothyroidism    Pancreatic cyst  benign   PONV (postoperative nausea and vomiting)    Pulmonary embolism ([place]) 02/2019   bilateral    Radiation 06/11/2012-07/12/2012   17 sessions 4250 cGy, 3 sessions 750 cGy   Vertigo    Wears glasses    Wears partial dentures    partial upper     PAST SURGICAL HISTORY: Past Surgical History:  Procedure Laterality Date   ABDOMINAL HYSTERECTOMY  1966   1/2 ovary left in    [place]   lumpectomy-lt   CATARACT EXTRACTION     b/l   COLONOSCOPY     EXCISION MASS NECK Left 03/17/2019    EXCISION MASS NECK (Left Neck)   EXCISION MASS NECK Left 03/17/2019   Procedure: EXCISION MASS NECK;  Surgeon: Helayne Seminole, MD;  Location: [facility];  Service: ENT;  Laterality: Left;   EYE SURGERY Bilateral    bilateral cataract removal   IR IMAGING GUIDED PORT INSERTION  04/23/2019   JOINT REPLACEMENT  2013   rt total knee   JOINT REPLACEMENT  1995   lt total knee   PACEMAKER IMPLANT N/A 11/01/2021   Procedure: PACEMAKER IMPLANT;  Surgeon: Vickie Epley, MD;  Location: [facility];  Service: Cardiovascular;  Laterality: N/A;   PARTIAL  MASTECTOMY WITH NEEDLE LOCALIZATION  04/09/2012   Procedure: PARTIAL MASTECTOMY WITH NEEDLE LOCALIZATION;  Surgeon: Adin Hector, MD;  Location: [facility];  Service: General;  Laterality: Right;   SKIN BIOPSY Left 03/17/2019   LEFT THIGH   SKIN BIOPSY Left 03/17/2019   Procedure: Skin Biopsy Left Thigh;  Surgeon: Helayne Seminole, MD;  Location: [facility];  Service: ENT;  Laterality: Left;   TONSILLECTOMY     TOTAL KNEE ARTHROPLASTY  08/16/2011   Procedure: TOTAL KNEE ARTHROPLASTY;  Surgeon: Ninetta Lights, MD;  Location: [facility];  Service: Orthopedics;  Laterality: Right;     FAMILY HISTORY: Family History  Problem Relation Age of Onset   Heart disease Father    Cancer Maternal Aunt        stomach     SOCIAL HISTORY: Social History   Socioeconomic History   Marital status: Widowed    Spouse name: Not on file   Number of children: 2   Years of education: Not on file   Highest education level: Not on file  Occupational History   Occupation: Retired    Comment: Community education officer   Tobacco Use   Smoking status: Never   Smokeless tobacco: Never  Vaping Use   Vaping Use: Never used  Substance and Sexual Activity   Alcohol use: No   Drug use: No   Sexual activity: Not Currently  Other Topics Concern   Not on file  Social History Narrative   Not on file   Social Determinants of Health   Financial Resource Strain: Not on file  Food Insecurity: Not on file  Transportation Needs: Not on file  Physical Activity: Not on file  Stress: Not on file  Social Connections: Not on file  Intimate Partner Violence: Not on file    PHYSICAL EXAM  Vitals:   12/06/21 1504  BP: 120/63  Pulse: 66  Weight: 111 lb (50.3 kg)  Height: 5\' 4"  (1.626 m)   Body mass index is 19.05 kg/m.  Generalized: Well developed, in no acute distress  Cardiology: normal rate and rhythm, no murmur auscultated  Respiratory: clear to auscultation bilaterally    Neurological  examination  Mentation: Alert  oriented to time, place, history taking. Follows all commands speech and language fluent Cranial nerve II-XII: Pupils were equal round reactive to light. Extraocular movements were full, visual field were full on confrontational test. Facial sensation and strength were normal. Head turning and shoulder shrug  were normal and symmetric. Motor: The motor testing reveals 5 over 5 strength of all 4 extremities. Good symmetric motor tone is noted throughout.  Gait and station: Gait is stable with walker.    DIAGNOSTIC DATA (LABS, IMAGING, TESTING) - I reviewed patient records, labs, notes, testing and imaging myself where available.  Lab Results  Component Value Date   WBC 3.1 (L) 11/04/2021   HGB 11.8 (L) 11/04/2021   HCT 35.9 (L) 11/04/2021   MCV 94.2 11/04/2021   PLT 92 (L) 11/04/2021      Component Value Date/Time   NA 140 11/04/2021 0556   NA 145 (H) 10/26/2021 1117   NA 141 07/25/2016 1053   K 3.7 11/04/2021 0556   K 3.6 07/25/2016 1053   CL 107 11/04/2021 0556   CL 106 04/25/2012 1606   CO2 24 11/04/2021 0556   CO2 27 07/25/2016 1053   GLUCOSE 86 11/04/2021 0556   GLUCOSE 102 07/25/2016 1053   GLUCOSE 114 (H) 04/25/2012 1606   BUN 23 11/04/2021 0556   BUN 19 10/26/2021 1117   BUN 15.5 07/25/2016 1053   CREATININE 0.67 11/04/2021 0556   CREATININE 0.83 10/05/2021 1240   CREATININE 0.8 07/25/2016 1053   CALCIUM 8.9 11/04/2021 0556   CALCIUM 10.1 07/25/2016 1053   PROT 5.3 (L) 11/04/2021 0556   PROT 7.5 07/25/2016 1053   ALBUMIN 3.2 (L) 11/04/2021 0556   ALBUMIN 4.3 07/25/2016 1053   AST 20 11/04/2021 0556   AST 20 10/05/2021 1240   AST 17 07/25/2016 1053   ALT 17 11/04/2021 0556   ALT 24 10/05/2021 1240   ALT 11 07/25/2016 1053   ALKPHOS 59 11/04/2021 0556   ALKPHOS 99 07/25/2016 1053   BILITOT 0.8 11/04/2021 0556   BILITOT 0.8 10/05/2021 1240   BILITOT 0.61 07/25/2016 1053   GFRNONAA >60 11/04/2021 0556   GFRNONAA >60 10/05/2021  1240   GFRAA >60 02/10/2020 0855   Lab Results  Component Value Date   CHOL 189 05/16/2021   HDL 76 05/16/2021   LDLCALC 98 05/16/2021   TRIG 75 05/16/2021   CHOLHDL 2.5 05/16/2021   Lab Results  Component Value Date   HGBA1C 5.2 05/16/2021   Lab Results  Component Value Date   VITAMINB12 810 10/01/2021   Lab Results  Component Value Date   TSH 1.225 11/01/2021        No data to display               No data to display           ASSESSMENT AND PLAN  88 y.o. year old female  has a past medical history of Allergy, Anxiety, Arrhythmia, Arthritis, Atrioventricular block, Mobitz type 1, Wenckebach, Breast cancer ([place]) (03/14/2012), Cancer ([place]), Cataract, DVT (deep venous thrombosis) ([place]) (02/2019), Dyspnea, Edema, Glaucoma, HOH (hard of hearing), Hyperlipemia, Hypertension, Hypothyroidism, Pancreatic cyst, PONV (postoperative nausea and vomiting), Pulmonary embolism ([place]) (02/2019), Radiation (06/11/2012-07/12/2012), Vertigo, Wears glasses, and Wears partial dentures. here with    Cerebral thrombosis with cerebral infarction  AV block  Memory loss  Sarah [name] is doing well, today. BP is well managed. She is doing well post pacemaker insertion for AV block. She feels forgetfulness continues to  stable. She is living at [facility]. She was encouraged to continue close follow up with care team. Could consider formal neurocognitive testing if she wishes. Healthy lifestyle habits encouraged. Stoke prevention and s/s reviewed. Educational materials provided. She will return to see me as needed. She and her daughter verbalize understanding and agreement with this plan.   No orders of the defined types were placed in this encounter.    No orders of the defined types were placed in this encounter.    Debbora Presto, MSN, FNP-C 12/06/2021, 3:56 PM  [facility] [address], [place], [place] [address] [phone]

## 2021-12-06 NOTE — Patient Instructions (Signed)
Below is our plan:  We will continue to monitor.   Please make sure you are staying well hydrated. I recommend 50-60 ounces daily. Well balanced diet and regular exercise encouraged. Consistent sleep schedule with 6-8 hours recommended.   Please continue follow up with care team as directed.   Follow up with me as needed   You may receive a survey regarding today's visit. I encourage you to leave honest feed back as I do use this information to improve patient care. Thank you for seeing me today!

## 2021-12-07 ENCOUNTER — Inpatient Hospital Stay: Payer: Medicare Other | Attending: Hematology

## 2021-12-07 ENCOUNTER — Other Ambulatory Visit: Payer: Self-pay

## 2021-12-07 DIAGNOSIS — C865 Angioimmunoblastic T-cell lymphoma: Secondary | ICD-10-CM | POA: Insufficient documentation

## 2021-12-07 DIAGNOSIS — Z95828 Presence of other vascular implants and grafts: Secondary | ICD-10-CM

## 2021-12-07 DIAGNOSIS — C8440 Peripheral T-cell lymphoma, not classified, unspecified site: Secondary | ICD-10-CM

## 2021-12-07 MED ORDER — HEPARIN SOD (PORK) LOCK FLUSH 100 UNIT/ML IV SOLN
500.0000 [IU] | Freq: Once | INTRAVENOUS | Status: AC
  Administered 2021-12-07: 500 [IU]

## 2021-12-07 MED ORDER — SODIUM CHLORIDE 0.9% FLUSH
10.0000 mL | Freq: Once | INTRAVENOUS | Status: AC
  Administered 2021-12-07: 10 mL

## 2021-12-09 DIAGNOSIS — I10 Essential (primary) hypertension: Secondary | ICD-10-CM | POA: Diagnosis not present

## 2021-12-09 DIAGNOSIS — I441 Atrioventricular block, second degree: Secondary | ICD-10-CM | POA: Diagnosis not present

## 2021-12-09 DIAGNOSIS — Z48812 Encounter for surgical aftercare following surgery on the circulatory system: Secondary | ICD-10-CM | POA: Diagnosis not present

## 2021-12-09 DIAGNOSIS — E785 Hyperlipidemia, unspecified: Secondary | ICD-10-CM | POA: Diagnosis not present

## 2021-12-09 DIAGNOSIS — E039 Hypothyroidism, unspecified: Secondary | ICD-10-CM | POA: Diagnosis not present

## 2021-12-09 DIAGNOSIS — I442 Atrioventricular block, complete: Secondary | ICD-10-CM | POA: Diagnosis not present

## 2021-12-12 DIAGNOSIS — C865 Angioimmunoblastic T-cell lymphoma: Secondary | ICD-10-CM | POA: Diagnosis not present

## 2021-12-12 DIAGNOSIS — F5101 Primary insomnia: Secondary | ICD-10-CM | POA: Diagnosis not present

## 2021-12-12 DIAGNOSIS — I441 Atrioventricular block, second degree: Secondary | ICD-10-CM | POA: Diagnosis not present

## 2021-12-12 DIAGNOSIS — Z8673 Personal history of transient ischemic attack (TIA), and cerebral infarction without residual deficits: Secondary | ICD-10-CM | POA: Diagnosis not present

## 2021-12-12 DIAGNOSIS — F419 Anxiety disorder, unspecified: Secondary | ICD-10-CM | POA: Diagnosis not present

## 2021-12-12 DIAGNOSIS — M79605 Pain in left leg: Secondary | ICD-10-CM | POA: Diagnosis not present

## 2021-12-12 DIAGNOSIS — I10 Essential (primary) hypertension: Secondary | ICD-10-CM | POA: Diagnosis not present

## 2021-12-12 DIAGNOSIS — K862 Cyst of pancreas: Secondary | ICD-10-CM | POA: Diagnosis not present

## 2021-12-12 DIAGNOSIS — I499 Cardiac arrhythmia, unspecified: Secondary | ICD-10-CM | POA: Diagnosis not present

## 2021-12-12 DIAGNOSIS — I442 Atrioventricular block, complete: Secondary | ICD-10-CM | POA: Diagnosis not present

## 2021-12-12 DIAGNOSIS — E039 Hypothyroidism, unspecified: Secondary | ICD-10-CM | POA: Diagnosis not present

## 2021-12-12 DIAGNOSIS — K219 Gastro-esophageal reflux disease without esophagitis: Secondary | ICD-10-CM | POA: Diagnosis not present

## 2021-12-12 DIAGNOSIS — Z86718 Personal history of other venous thrombosis and embolism: Secondary | ICD-10-CM | POA: Diagnosis not present

## 2021-12-12 DIAGNOSIS — Z950 Presence of cardiac pacemaker: Secondary | ICD-10-CM | POA: Diagnosis not present

## 2021-12-12 DIAGNOSIS — D696 Thrombocytopenia, unspecified: Secondary | ICD-10-CM | POA: Diagnosis not present

## 2021-12-12 DIAGNOSIS — R42 Dizziness and giddiness: Secondary | ICD-10-CM | POA: Diagnosis not present

## 2021-12-12 DIAGNOSIS — Z86711 Personal history of pulmonary embolism: Secondary | ICD-10-CM | POA: Diagnosis not present

## 2021-12-12 DIAGNOSIS — Z48812 Encounter for surgical aftercare following surgery on the circulatory system: Secondary | ICD-10-CM | POA: Diagnosis not present

## 2021-12-12 DIAGNOSIS — Z96653 Presence of artificial knee joint, bilateral: Secondary | ICD-10-CM | POA: Diagnosis not present

## 2021-12-12 DIAGNOSIS — H9190 Unspecified hearing loss, unspecified ear: Secondary | ICD-10-CM | POA: Diagnosis not present

## 2021-12-12 DIAGNOSIS — M1990 Unspecified osteoarthritis, unspecified site: Secondary | ICD-10-CM | POA: Diagnosis not present

## 2021-12-12 DIAGNOSIS — E785 Hyperlipidemia, unspecified: Secondary | ICD-10-CM | POA: Diagnosis not present

## 2021-12-12 DIAGNOSIS — H409 Unspecified glaucoma: Secondary | ICD-10-CM | POA: Diagnosis not present

## 2021-12-12 DIAGNOSIS — F32A Depression, unspecified: Secondary | ICD-10-CM | POA: Diagnosis not present

## 2021-12-12 DIAGNOSIS — Z853 Personal history of malignant neoplasm of breast: Secondary | ICD-10-CM | POA: Diagnosis not present

## 2021-12-15 DIAGNOSIS — E785 Hyperlipidemia, unspecified: Secondary | ICD-10-CM | POA: Diagnosis not present

## 2021-12-15 DIAGNOSIS — I441 Atrioventricular block, second degree: Secondary | ICD-10-CM | POA: Diagnosis not present

## 2021-12-15 DIAGNOSIS — E039 Hypothyroidism, unspecified: Secondary | ICD-10-CM | POA: Diagnosis not present

## 2021-12-15 DIAGNOSIS — I442 Atrioventricular block, complete: Secondary | ICD-10-CM | POA: Diagnosis not present

## 2021-12-15 DIAGNOSIS — Z48812 Encounter for surgical aftercare following surgery on the circulatory system: Secondary | ICD-10-CM | POA: Diagnosis not present

## 2021-12-15 DIAGNOSIS — I10 Essential (primary) hypertension: Secondary | ICD-10-CM | POA: Diagnosis not present

## 2021-12-16 DIAGNOSIS — I10 Essential (primary) hypertension: Secondary | ICD-10-CM | POA: Diagnosis not present

## 2021-12-16 DIAGNOSIS — E039 Hypothyroidism, unspecified: Secondary | ICD-10-CM | POA: Diagnosis not present

## 2021-12-16 DIAGNOSIS — Z48812 Encounter for surgical aftercare following surgery on the circulatory system: Secondary | ICD-10-CM | POA: Diagnosis not present

## 2021-12-16 DIAGNOSIS — E785 Hyperlipidemia, unspecified: Secondary | ICD-10-CM | POA: Diagnosis not present

## 2021-12-16 DIAGNOSIS — I442 Atrioventricular block, complete: Secondary | ICD-10-CM | POA: Diagnosis not present

## 2021-12-16 DIAGNOSIS — I441 Atrioventricular block, second degree: Secondary | ICD-10-CM | POA: Diagnosis not present

## 2021-12-22 DIAGNOSIS — E785 Hyperlipidemia, unspecified: Secondary | ICD-10-CM | POA: Diagnosis not present

## 2021-12-22 DIAGNOSIS — E039 Hypothyroidism, unspecified: Secondary | ICD-10-CM | POA: Diagnosis not present

## 2021-12-22 DIAGNOSIS — I10 Essential (primary) hypertension: Secondary | ICD-10-CM | POA: Diagnosis not present

## 2021-12-22 DIAGNOSIS — Z48812 Encounter for surgical aftercare following surgery on the circulatory system: Secondary | ICD-10-CM | POA: Diagnosis not present

## 2021-12-22 DIAGNOSIS — I442 Atrioventricular block, complete: Secondary | ICD-10-CM | POA: Diagnosis not present

## 2021-12-22 DIAGNOSIS — I441 Atrioventricular block, second degree: Secondary | ICD-10-CM | POA: Diagnosis not present

## 2021-12-27 ENCOUNTER — Ambulatory Visit (INDEPENDENT_AMBULATORY_CARE_PROVIDER_SITE_OTHER): Payer: Medicare Other | Admitting: Podiatry

## 2021-12-27 ENCOUNTER — Encounter: Payer: Self-pay | Admitting: Podiatry

## 2021-12-27 DIAGNOSIS — L84 Corns and callosities: Secondary | ICD-10-CM

## 2021-12-27 DIAGNOSIS — G629 Polyneuropathy, unspecified: Secondary | ICD-10-CM

## 2021-12-27 DIAGNOSIS — M79674 Pain in right toe(s): Secondary | ICD-10-CM

## 2021-12-27 DIAGNOSIS — D689 Coagulation defect, unspecified: Secondary | ICD-10-CM

## 2021-12-27 DIAGNOSIS — B351 Tinea unguium: Secondary | ICD-10-CM

## 2021-12-27 DIAGNOSIS — M205X2 Other deformities of toe(s) (acquired), left foot: Secondary | ICD-10-CM | POA: Diagnosis not present

## 2021-12-27 DIAGNOSIS — M79675 Pain in left toe(s): Secondary | ICD-10-CM

## 2021-12-27 NOTE — Progress Notes (Signed)
This patient returns to my office for at risk foot care.  This patient requires this care by a professional since this patient will be at risk due to having neuropathy  and coagulation defect.  Patient is taking eliquis.This patient is unable to cut nails herself since the patient cannot reach her nails.These nails are painful walking and wearing shoes.  She has painful corn on the tip of her third toe left foot. She presents to the office with her daughter.This patient presents for at risk foot care today.  General Appearance  Alert, conversant and in no acute stress.  Vascular  Dorsalis pedis and posterior tibial  pulses are  weakly palpable  bilaterally.  Capillary return is within normal limits  bilaterally. Temperature is within normal limits  bilaterally.Digital hair  noted.    Neurologic  Senn-Weinstein monofilament wire test within normal limits  bilaterally. Muscle power within normal limits bilaterally.  Nails Thick disfigured discolored nails with subungual debris  from hallux to fifth toes bilaterally. No evidence of bacterial infection or drainage bilaterally.  Orthopedic  No limitations of motion  feet .  No crepitus or effusions noted.  Hammer toes  B/L.  Skin  normotropic skin with no porokeratosis noted bilaterally.  No signs of infections or ulcers noted.   Distal clavi 3rd toe left foot.     Onychomycosis  Pain in right toes  Pain in left toes  Consent was obtained for treatment procedures.   Mechanical debridement of nails 1-5  bilaterally performed with a nail nipper.  Filed with dremel without incident. Debridement of clavi with # 15 blade.  Padding dispensed. Discussed flexor tenotomy with patient and daughter.   Return office visit   9 weeks                  Told patient to return for periodic foot care and evaluation due to potential at risk complications.   Gardiner Barefoot DPM

## 2021-12-29 DIAGNOSIS — I441 Atrioventricular block, second degree: Secondary | ICD-10-CM | POA: Diagnosis not present

## 2021-12-29 DIAGNOSIS — I442 Atrioventricular block, complete: Secondary | ICD-10-CM | POA: Diagnosis not present

## 2021-12-29 DIAGNOSIS — E039 Hypothyroidism, unspecified: Secondary | ICD-10-CM | POA: Diagnosis not present

## 2021-12-29 DIAGNOSIS — I10 Essential (primary) hypertension: Secondary | ICD-10-CM | POA: Diagnosis not present

## 2021-12-29 DIAGNOSIS — Z48812 Encounter for surgical aftercare following surgery on the circulatory system: Secondary | ICD-10-CM | POA: Diagnosis not present

## 2021-12-29 DIAGNOSIS — E785 Hyperlipidemia, unspecified: Secondary | ICD-10-CM | POA: Diagnosis not present

## 2022-01-04 ENCOUNTER — Telehealth: Payer: Self-pay | Admitting: Cardiology

## 2022-01-04 NOTE — Telephone Encounter (Signed)
Patient's daughter called wanting to know if her mother needs to be on antibiotics for a crown that she is having done in about a week.

## 2022-01-04 NOTE — Telephone Encounter (Signed)
Advised Sarah [name] [facility]) that she does not need antibiotics from a PPM standpoint per MD Quentin Ore.  Verbalized understanding.

## 2022-01-05 DIAGNOSIS — I10 Essential (primary) hypertension: Secondary | ICD-10-CM | POA: Diagnosis not present

## 2022-01-05 DIAGNOSIS — Z48812 Encounter for surgical aftercare following surgery on the circulatory system: Secondary | ICD-10-CM | POA: Diagnosis not present

## 2022-01-05 DIAGNOSIS — E039 Hypothyroidism, unspecified: Secondary | ICD-10-CM | POA: Diagnosis not present

## 2022-01-05 DIAGNOSIS — I442 Atrioventricular block, complete: Secondary | ICD-10-CM | POA: Diagnosis not present

## 2022-01-05 DIAGNOSIS — I441 Atrioventricular block, second degree: Secondary | ICD-10-CM | POA: Diagnosis not present

## 2022-01-05 DIAGNOSIS — E785 Hyperlipidemia, unspecified: Secondary | ICD-10-CM | POA: Diagnosis not present

## 2022-01-09 DIAGNOSIS — E785 Hyperlipidemia, unspecified: Secondary | ICD-10-CM | POA: Diagnosis not present

## 2022-01-09 DIAGNOSIS — Z48812 Encounter for surgical aftercare following surgery on the circulatory system: Secondary | ICD-10-CM | POA: Diagnosis not present

## 2022-01-09 DIAGNOSIS — I442 Atrioventricular block, complete: Secondary | ICD-10-CM | POA: Diagnosis not present

## 2022-01-09 DIAGNOSIS — I441 Atrioventricular block, second degree: Secondary | ICD-10-CM | POA: Diagnosis not present

## 2022-01-09 DIAGNOSIS — I10 Essential (primary) hypertension: Secondary | ICD-10-CM | POA: Diagnosis not present

## 2022-01-09 DIAGNOSIS — E039 Hypothyroidism, unspecified: Secondary | ICD-10-CM | POA: Diagnosis not present

## 2022-01-11 DIAGNOSIS — I10 Essential (primary) hypertension: Secondary | ICD-10-CM | POA: Diagnosis not present

## 2022-01-16 ENCOUNTER — Other Ambulatory Visit: Payer: Self-pay

## 2022-01-17 ENCOUNTER — Encounter: Payer: Self-pay | Admitting: Hematology

## 2022-01-18 ENCOUNTER — Ambulatory Visit (INDEPENDENT_AMBULATORY_CARE_PROVIDER_SITE_OTHER): Payer: Medicare Other | Admitting: Podiatry

## 2022-01-18 DIAGNOSIS — M7742 Metatarsalgia, left foot: Secondary | ICD-10-CM

## 2022-01-18 NOTE — Progress Notes (Signed)
Subjective:  Patient ID: Sarah [name], female    DOB: 1933-04-28,  MRN: [id number]  Chief Complaint  Patient presents with   Callouses    88 y.o. female presents with the above complaint.  Patient presents with left forefoot pain.  Patient states pain for touch is progressive gotten worse hurts with ambulation.  Hurts with pressure.  She normally sees Dr. Prudence Davidson for routine foot care.  She wanted to get it evaluated.  She has not seen anyone else prior to seeing me for this.  She does not want to surgical options.  She would like to discuss conservative care.  She has not tried any immobilization any padding.   Review of Systems: Negative except as noted in the HPI. Denies N/V/F/Ch.  Past Medical History:  Diagnosis Date   Allergy    codeine, thiazides   Anxiety    new dx   Arrhythmia    Arthritis    Atrioventricular block, Mobitz type 1, Wenckebach    Breast cancer ([place]) 03/14/2012   bx=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,ER/PR=+,upper inner quad   Cancer (HCC)    breast   Cataract    DVT (deep venous thrombosis) ([place]) 02/2019   left leg   Dyspnea    Edema    both legs feet and toe, abdomen   Glaucoma    laser treated years ago   HOH (hard of hearing)    Hyperlipemia    Hypertension    Hypothyroidism    Pancreatic cyst    benign   PONV (postoperative nausea and vomiting)    Pulmonary embolism ([place]) 02/2019   bilateral    Radiation 06/11/2012-07/12/2012   17 sessions 4250 cGy, 3 sessions 750 cGy   Vertigo    Wears glasses    Wears partial dentures    partial upper    Current Outpatient Medications:    acetaminophen (TYLENOL) 500 MG tablet, Take 1,000 mg by mouth every 6 (six) hours as needed for moderate pain., Disp: , Rfl:    amLODipine (NORVASC) 10 MG tablet, Take 1 tablet (10 mg total) by mouth daily., Disp: 30 tablet, Rfl: 6   apixaban (ELIQUIS) 2.5 MG TABS tablet, Take 1 tablet (2.5 mg total) by mouth 2 (two) times daily. Resume Sunday  with the evening dose., Disp: 180 tablet, Rfl: 1   atorvastatin (LIPITOR) 20 MG tablet, Take 1 tablet (20 mg total) by mouth daily. (Patient taking differently: Take 20 mg by mouth every evening.), Disp: 30 tablet, Rfl: 3   carvedilol (COREG) 6.25 MG tablet, Take 1 tablet (6.25 mg total) by mouth 2 (two) times daily with a meal., Disp: 60 tablet, Rfl: 6   citalopram (CELEXA) 10 MG tablet, Take 10 mg by mouth daily., Disp: , Rfl:    furosemide (LASIX) 20 MG tablet, Take 20-40 mg by mouth See admin instructions. 40 mg on Monday,Wednesday and Friday 20 mg on Tuesday,Thursday,Saturday and sunday, Disp: , Rfl:    hydrALAZINE (APRESOLINE) 100 MG tablet, Take 1 tablet (100 mg total) by mouth 2 (two) times daily. (Patient taking differently: Take 100 mg by mouth 2 (two) times daily as needed.), Disp: 60 tablet, Rfl: 6   levothyroxine (SYNTHROID) 25 MCG tablet, Take 25 mcg by mouth daily before breakfast. , Disp: , Rfl:    lidocaine-prilocaine (EMLA) cream, Apply to affected area once (Patient taking differently: Apply 1 application  topically See admin instructions. Apply to port site prior to port access), Disp: 30 g, Rfl: 3   Multiple  Vitamins-Minerals (PRESERVISION AREDS PO), Take 1 capsule by mouth 2 (two) times daily. , Disp: , Rfl:    omeprazole (PRILOSEC) 20 MG capsule, Take 1 capsule (20 mg total) by mouth daily. (Patient taking differently: Take 20 mg by mouth daily as needed (stomach).), Disp: 30 capsule, Rfl: 0   Polyethyl Glycol-Propyl Glycol (SYSTANE) 0.4-0.3 % GEL ophthalmic gel, Place 1 application into both eyes daily as needed (dry eyes)., Disp: , Rfl:    sodium chloride (MURO 128) 5 % ophthalmic ointment, Place 1 application  into both eyes at bedtime as needed for eye irritation., Disp: , Rfl:    zolpidem (AMBIEN) 5 MG tablet, Take 5 mg by mouth at bedtime., Disp: , Rfl:   Social History   Tobacco Use  Smoking Status Never  Smokeless Tobacco Never    Allergies  Allergen Reactions    Alendronate Sodium Other (See Comments)    UNK reaction   Amoxicillin-Pot Clavulanate Other (See Comments)    UNK reaction   Codeine Other (See Comments)    Reaction not recalled   Lisinopril Other (See Comments)    UNK reaction   Septra [Sulfamethoxazole-Trimethoprim] Other (See Comments)   Thiazide-Type Diuretics Other (See Comments)    Blurry vision?? (patient stated she has macular degeneration)   Latex Rash   Objective:  There were no vitals filed for this visit. There is no height or weight on file to calculate BMI. Constitutional Well developed. Well nourished.  Vascular Dorsalis pedis pulses palpable bilaterally. Posterior tibial pulses palpable bilaterally. Capillary refill normal to all digits.  No cyanosis or clubbing noted. Pedal hair growth normal.  Neurologic Normal speech. Oriented to person, place, and time. Epicritic sensation to light touch grossly present bilaterally.  Dermatologic Generalized forefoot pain noted likely due to forefoot plantar fat pad atrophy.  No extensor or flexor tendinitis.  No capsulitis noted at the metatarsophalangeal joint  Orthopedic: Normal joint ROM without pain or crepitus bilaterally. No visible deformities. No bony tenderness.   Radiographs: None Assessment:   1. Metatarsalgia, left foot    Plan:  Patient was evaluated and treated and all questions answered.  Left forefoot metatarsalgia -I will visit concerns were discussed with the patient in extensive detail.  Given the amount of pain that she is having she will benefit from metatarsal pad and offloading.  Ultimately I discussed shoe gear modification as well with wider toebox shoes.  I discussed with patient she states understanding. -If any foot and ankle issues arise in future advised her to come back and see me.  No follow-ups on file.

## 2022-01-26 ENCOUNTER — Ambulatory Visit: Payer: Medicare Other | Admitting: Podiatry

## 2022-01-31 NOTE — Addendum Note (Signed)
Encounter addended by: Markus Daft A on: 01/31/2022 12:09 PM  Actions taken: Imaging Exam ended

## 2022-02-01 ENCOUNTER — Ambulatory Visit (INDEPENDENT_AMBULATORY_CARE_PROVIDER_SITE_OTHER): Payer: Medicare Other

## 2022-02-01 DIAGNOSIS — I442 Atrioventricular block, complete: Secondary | ICD-10-CM | POA: Diagnosis not present

## 2022-02-02 LAB — CUP PACEART REMOTE DEVICE CHECK
Date Time Interrogation Session: 20230913102054
Implantable Lead Implant Date: 20230613
Implantable Lead Implant Date: 20230613
Implantable Lead Location: 753859
Implantable Lead Location: 753860
Implantable Lead Model: 377
Implantable Lead Model: 377
Implantable Lead Serial Number: 8000826615
Implantable Lead Serial Number: 8000886802
Implantable Pulse Generator Implant Date: 20230613
Pulse Gen Model: 407145
Pulse Gen Serial Number: 70422578

## 2022-02-07 ENCOUNTER — Other Ambulatory Visit: Payer: Self-pay

## 2022-02-07 DIAGNOSIS — C8440 Peripheral T-cell lymphoma, not classified, unspecified site: Secondary | ICD-10-CM

## 2022-02-08 ENCOUNTER — Other Ambulatory Visit: Payer: Self-pay

## 2022-02-08 ENCOUNTER — Inpatient Hospital Stay: Payer: Medicare Other | Attending: Hematology | Admitting: Hematology

## 2022-02-08 ENCOUNTER — Inpatient Hospital Stay: Payer: Medicare Other

## 2022-02-08 VITALS — BP 124/88 | HR 79 | Temp 97.8°F | Resp 18 | Ht 64.0 in | Wt 109.5 lb

## 2022-02-08 DIAGNOSIS — C8440 Peripheral T-cell lymphoma, not classified, unspecified site: Secondary | ICD-10-CM | POA: Diagnosis not present

## 2022-02-08 DIAGNOSIS — C865 Angioimmunoblastic T-cell lymphoma: Secondary | ICD-10-CM | POA: Diagnosis not present

## 2022-02-08 DIAGNOSIS — Z95828 Presence of other vascular implants and grafts: Secondary | ICD-10-CM

## 2022-02-08 LAB — CMP (CANCER CENTER ONLY)
ALT: 15 U/L (ref 0–44)
AST: 22 U/L (ref 15–41)
Albumin: 3.9 g/dL (ref 3.5–5.0)
Alkaline Phosphatase: 61 U/L (ref 38–126)
Anion gap: 9 (ref 5–15)
BUN: 28 mg/dL — ABNORMAL HIGH (ref 8–23)
CO2: 27 mmol/L (ref 22–32)
Calcium: 9.3 mg/dL (ref 8.9–10.3)
Chloride: 107 mmol/L (ref 98–111)
Creatinine: 0.68 mg/dL (ref 0.44–1.00)
GFR, Estimated: >60 mL/min (ref >60)
Glucose, Bld: 150 mg/dL — ABNORMAL HIGH (ref 70–99)
Potassium: 3.5 mmol/L (ref 3.5–5.1)
Sodium: 143 mmol/L (ref 135–145)
Total Bilirubin: 0.7 mg/dL (ref 0.3–1.2)
Total Protein: 6.6 g/dL (ref 6.5–8.1)

## 2022-02-08 LAB — CBC WITH DIFFERENTIAL (CANCER CENTER ONLY)
Abs Immature Granulocytes: 0.01 10*3/uL (ref 0.00–0.07)
Basophils Absolute: 0 10*3/uL (ref 0.0–0.1)
Basophils Relative: 1 %
Eosinophils Absolute: 0.1 10*3/uL (ref 0.0–0.5)
Eosinophils Relative: 2 %
HCT: 40.4 % (ref 36.0–46.0)
Hemoglobin: 13.3 g/dL (ref 12.0–15.0)
Immature Granulocytes: 0 %
Lymphocytes Relative: 12 %
Lymphs Abs: 0.6 10*3/uL — ABNORMAL LOW (ref 0.7–4.0)
MCH: 30.9 pg (ref 26.0–34.0)
MCHC: 32.9 g/dL (ref 30.0–36.0)
MCV: 94 fL (ref 80.0–100.0)
Monocytes Absolute: 0.5 10*3/uL (ref 0.1–1.0)
Monocytes Relative: 11 %
Neutro Abs: 3.3 10*3/uL (ref 1.7–7.7)
Neutrophils Relative %: 74 %
Platelet Count: 118 10*3/uL — ABNORMAL LOW (ref 150–400)
RBC: 4.3 MIL/uL (ref 3.87–5.11)
RDW: 13.2 % (ref 11.5–15.5)
WBC Count: 4.5 10*3/uL (ref 4.0–10.5)
nRBC: 0 % (ref 0.0–0.2)

## 2022-02-08 LAB — LACTATE DEHYDROGENASE: LDH: 181 U/L (ref 98–192)

## 2022-02-08 MED ORDER — HEPARIN SOD (PORK) LOCK FLUSH 100 UNIT/ML IV SOLN
500.0000 [IU] | Freq: Once | INTRAVENOUS | Status: AC
  Administered 2022-02-08: 500 [IU]

## 2022-02-08 MED ORDER — SODIUM CHLORIDE 0.9% FLUSH
10.0000 mL | Freq: Once | INTRAVENOUS | Status: AC
  Administered 2022-02-08: 10 mL

## 2022-02-13 DIAGNOSIS — R110 Nausea: Secondary | ICD-10-CM | POA: Diagnosis not present

## 2022-02-13 DIAGNOSIS — K921 Melena: Secondary | ICD-10-CM | POA: Diagnosis not present

## 2022-02-14 ENCOUNTER — Encounter: Payer: Self-pay | Admitting: Hematology

## 2022-02-14 NOTE — Progress Notes (Signed)
HEMATOLOGY/ONCOLOGY CLINIC NOTE  Date of Service: 02/08/2022    Patient Care Team: Lajean Manes, MD as PCP - General (Internal Medicine) Buford Dresser, MD as PCP - Cardiology (Cardiology)  REFERRING PHYSICIAN: Lajean Manes, MD  CHIEF COMPLAINTS/PURPOSE OF CONSULTATION:  Follow-up of for continued evaluation and management of angioimmunoblastic T-cell lymphoma  HISTORY OF PRESENTING ILLNESS:  Please see previous notes for details on initial presentation  INTERVAL HISTORY:  Sarah [name] is a 88 y.o. female who is here from her skilled nursing facility with her daughter for follow-up of her angioimmunoblastic T-cell lymphoma. She notes no new skin rashes or pruritus. No new lumps or bumps.  No fever No chills no night sweats no unexpected weight loss  Labs done today were discussed in detail with the patient.  MEDICAL HISTORY:  Past Medical History:  Diagnosis Date   Allergy    codeine, thiazides   Anxiety    new dx   Arrhythmia    Arthritis    Atrioventricular block, Mobitz type 1, Wenckebach    Breast cancer ([place]) 03/14/2012   bx=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,ER/PR=+,upper inner quad   Cancer (HCC)    breast   Cataract    DVT (deep venous thrombosis) ([place]) 02/2019   left leg   Dyspnea    Edema    both legs feet and toe, abdomen   Glaucoma    laser treated years ago   HOH (hard of hearing)    Hyperlipemia    Hypertension    Hypothyroidism    Pancreatic cyst    benign   PONV (postoperative nausea and vomiting)    Pulmonary embolism ([place]) 02/2019   bilateral    Radiation 06/11/2012-07/12/2012   17 sessions 4250 cGy, 3 sessions 750 cGy   Vertigo    Wears glasses    Wears partial dentures    partial upper     SURGICAL HISTORY: Past Surgical History:  Procedure Laterality Date   ABDOMINAL HYSTERECTOMY  1966   1/2 ovary left in    [place]   lumpectomy-lt   CATARACT EXTRACTION     b/l    COLONOSCOPY     EXCISION MASS NECK Left 03/17/2019    EXCISION MASS NECK (Left Neck)   EXCISION MASS NECK Left 03/17/2019   Procedure: EXCISION MASS NECK;  Surgeon: Helayne Seminole, MD;  Location: [facility];  Service: ENT;  Laterality: Left;   EYE SURGERY Bilateral    bilateral cataract removal   IR IMAGING GUIDED PORT INSERTION  04/23/2019   JOINT REPLACEMENT  2013   rt total knee   JOINT REPLACEMENT  1995   lt total knee   PACEMAKER IMPLANT N/A 11/01/2021   Procedure: PACEMAKER IMPLANT;  Surgeon: Vickie Epley, MD;  Location: [facility];  Service: Cardiovascular;  Laterality: N/A;   PARTIAL MASTECTOMY WITH NEEDLE LOCALIZATION  04/09/2012   Procedure: PARTIAL MASTECTOMY WITH NEEDLE LOCALIZATION;  Surgeon: Adin Hector, MD;  Location: [facility];  Service: General;  Laterality: Right;   SKIN BIOPSY Left 03/17/2019   LEFT THIGH   SKIN BIOPSY Left 03/17/2019   Procedure: Skin Biopsy Left Thigh;  Surgeon: Helayne Seminole, MD;  Location: [facility];  Service: ENT;  Laterality: Left;   TONSILLECTOMY     TOTAL KNEE ARTHROPLASTY  08/16/2011   Procedure: TOTAL KNEE ARTHROPLASTY;  Surgeon: Ninetta Lights, MD;  Location: [facility];  Service: Orthopedics;  Laterality: Right;  SOCIAL HISTORY: Social History   Socioeconomic History   Marital status: Widowed    Spouse name: Not on file   Number of children: 2   Years of education: Not on file   Highest education level: Not on file  Occupational History   Occupation: Retired    Comment: Community education officer   Tobacco Use   Smoking status: Never   Smokeless tobacco: Never  Vaping Use   Vaping Use: Never used  Substance and Sexual Activity   Alcohol use: No   Drug use: No   Sexual activity: Not Currently  Other Topics Concern   Not on file  Social History Narrative   Not on file   Social Determinants of Health   Financial Resource Strain: Not on file  Food Insecurity: Not on file  Transportation Needs:  Not on file  Physical Activity: Not on file  Stress: Not on file  Social Connections: Not on file  Intimate Partner Violence: Not on file     FAMILY HISTORY: Family History  Problem Relation Age of Onset   Heart disease Father    Cancer Maternal Aunt        stomach     ALLERGIES:   is allergic to alendronate sodium, amoxicillin-pot clavulanate, codeine, lisinopril, septra [sulfamethoxazole-trimethoprim], thiazide-type diuretics, and latex.   MEDICATIONS:  Current Outpatient Medications  Medication Sig Dispense Refill   acetaminophen (TYLENOL) 500 MG tablet Take 1,000 mg by mouth every 6 (six) hours as needed for moderate pain.     amLODipine (NORVASC) 10 MG tablet Take 1 tablet (10 mg total) by mouth daily. 30 tablet 6   apixaban (ELIQUIS) 2.5 MG TABS tablet Take 1 tablet (2.5 mg total) by mouth 2 (two) times daily. Resume Sunday with the evening dose. 180 tablet 1   atorvastatin (LIPITOR) 20 MG tablet Take 1 tablet (20 mg total) by mouth daily. (Patient taking differently: Take 20 mg by mouth every evening.) 30 tablet 3   carvedilol (COREG) 6.25 MG tablet Take 1 tablet (6.25 mg total) by mouth 2 (two) times daily with a meal. 60 tablet 6   citalopram (CELEXA) 10 MG tablet Take 10 mg by mouth daily.     furosemide (LASIX) 20 MG tablet Take 20-40 mg by mouth See admin instructions. 40 mg on Monday,Wednesday and Friday 20 mg on Tuesday,Thursday,Saturday and sunday     levothyroxine (SYNTHROID) 25 MCG tablet Take 25 mcg by mouth daily before breakfast.      lidocaine-prilocaine (EMLA) cream Apply to affected area once (Patient taking differently: Apply 1 application  topically See admin instructions. Apply to port site prior to port access) 30 g 3   Polyethyl Glycol-Propyl Glycol (SYSTANE) 0.4-0.3 % GEL ophthalmic gel Place 1 application into both eyes daily as needed (dry eyes).     sodium chloride (MURO 128) 5 % ophthalmic ointment Place 1 application  into both eyes at bedtime as  needed for eye irritation.     zolpidem (AMBIEN) 5 MG tablet Take 5 mg by mouth at bedtime.     hydrALAZINE (APRESOLINE) 100 MG tablet Take 1 tablet (100 mg total) by mouth 2 (two) times daily. (Patient not taking: Reported on 02/08/2022) 60 tablet 6   Multiple Vitamins-Minerals (PRESERVISION AREDS PO) Take 1 capsule by mouth 2 (two) times daily.  (Patient not taking: Reported on 02/08/2022)     omeprazole (PRILOSEC) 20 MG capsule Take 1 capsule (20 mg total) by mouth daily. (Patient not taking: Reported on 02/08/2022) 30 capsule 0  No current facility-administered medications for this visit.     REVIEW OF SYSTEMS:   10 Point review of Systems was done is negative except as noted above.  PHYSICAL EXAMINATION:  .BP 124/88 (BP Location: Left Arm, Patient Position: Sitting)   Pulse 79   Temp 97.8 F (36.6 C) (Temporal)   Resp 18   Ht 5\' 4"  (1.626 m)   Wt 109 lb 8 oz (49.7 kg)   SpO2 96%   BMI 18.80 kg/m  NAD GENERAL:alert, in no acute distress and comfortable SKIN: no acute rashes, no significant lesions EYES: conjunctiva are pink and non-injected, sclera anicteric NECK: supple, no JVD LYMPH:  no palpable lymphadenopathy in the cervical, axillary or inguinal regions LUNGS: clear to auscultation b/l with normal respiratory effort HEART: regular rate & rhythm ABDOMEN:  normoactive bowel sounds , non tender, not distended. Extremity: no pedal edema PSYCH: alert & oriented x 3 with fluent speech NEURO: no focal motor/sensory deficits  Exam performed in chair.  LABORATORY DATA:  I have reviewed the data as listed     Latest Ref Rng & Units 02/08/2022    1:49 PM 11/04/2021    6:48 AM 11/03/2021    5:00 AM  CBC  WBC 4.0 - 10.5 K/uL 4.5  3.1  3.6   Hemoglobin 12.0 - 15.0 g/dL 13.3  11.8  12.2   Hematocrit 36.0 - 46.0 % 40.4  35.9  37.8   Platelets 150 - 400 K/uL 118  92  99        Latest Ref Rng & Units 02/08/2022    1:49 PM 11/04/2021    5:56 AM 11/03/2021    5:00 AM  CMP   Glucose 70 - 99 mg/dL 150  86  88   BUN 8 - 23 mg/dL 28  23  15    Creatinine 0.44 - 1.00 mg/dL 0.68  0.67  0.60   Sodium 135 - 145 mmol/L 143  140  140   Potassium 3.5 - 5.1 mmol/L 3.5  3.7  3.8   Chloride 98 - 111 mmol/L 107  107  107   CO2 22 - 32 mmol/L 27  24  25    Calcium 8.9 - 10.3 mg/dL 9.3  8.9  9.2   Total Protein 6.5 - 8.1 g/dL 6.6  5.3  5.8   Total Bilirubin 0.3 - 1.2 mg/dL 0.7  0.8  1.0   Alkaline Phos 38 - 126 U/L 61  59  62   AST 15 - 41 U/L 22  20  24    ALT 0 - 44 U/L 15  17  18     . Lab Results  Component Value Date   LDH 181 02/08/2022      04/08/2019 NM PET Image Initial (PI) Skull Base To Thigh (Accession [id number])  04/04/2019 PATHOLOGY   04/07/2019 ECHOCARDIOGRAM  03/28/2019 PATHOLOGY   03/06/2019 CT ANGIOGRAPHY CHEST WITH CONTRAST (Accession [id number])  04/07/2019 ECHOCARDIOGRAM  03/06/2019 ECHOCARDIOGRAM     03/06/2019 PORTABLE CHEST 1 VIEW (Accession [id number])    02/27/2019 CT CHEST, ABDOMEN, AND PELVIS WITH CONTRAST (Accession [id number])    02/17/2019 CHEST XRAY - 2 VIEW (Accession [id number])    RADIOGRAPHIC STUDIES: I have personally reviewed the radiological images as listed and agreed with the findings in the report. CUP PACEART REMOTE DEVICE CHECK  Result Date: 02/02/2022 Scheduled remote reviewed. Normal device function.  Next remote 91 days. [place]    ASSESSMENT & PLAN:   Sarah [name] is a 88 y.o. female  with:  1. 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 SKIN, LEFT THIGH, BIOPSY: - Involvement by angioimmunoblastic T-cell lymphoma." -04/07/2019 Echocardiogram - normal EF -04/08/2019 NM PET Image Initial (PI) Skull Base To Thigh (Accession [id number]) revealed "1. Widespread hypermetabolic adenopathy in the neck, chest, and abdomen/pelvis, primarily in the  Deauville 4 and Deauville 5 range. There is also hypermetabolic activity along the posterior nasopharynx in lingual tonsillar regions potentially indicating sites of involvement. 2. The subtle hypodensities in the spine and bony pelvis are not appreciably hypermetabolic may be incidental, surveillance is suggested. 3. Bilateral thyroid activity but especially on the left. Probably from thyroiditis. 4. A 6 mm subpleural nodule anteriorly in the right lower lobe not appreciably hypermetabolic but is below sensitive PET-CT size thresholds and merits surveillance. 5. Mildly accentuated diffuse splenic activity without splenomegaly or focal splenic lesion identified. 6. Other imaging findings of potential clinical significance: Aortic Atherosclerosis (ICD10-I70.0). Coronary atherosclerosis. Large right and small left pleural effusions. Moderate cardiomegaly. Small pericardial effusion. Mild mesenteric edema. Large left renal cyst. Fullness of both renal collecting systems with suspected nonobstructive left nephrolithiasis. Degenerative grade 1 anterolisthesis at L4-5 at L5-S1. Trace pelvic ascites." -05/28/2019 PET/CT ([id number]) revealed "Complete metabolic response to therapy". -09/16/2019 PET/CT ([id number]) revealed "1. Unfortunately evidence of lymphoma recurrence at sites of previous hypermetabolic adenopathy. Hypermetabolic lymphoid tissue is new from comparison PET-CT 05/28/2019 but at same locations as PET-CT 04/08/2019. The number of metastatic lymph nodes is less than 04/08/2019. Lymphoid tissue at the RIGHT skull base is larger. 2. Three foci of hypermetabolic recurrence are present, lymphoid tissue at the RIGHT base of skull, hypermetabolic RIGHT hilar lymph node and hypermetabolic lymph node in the porta hepatis." -11/25/2019 PET/CT ([id number]) revealed "1. Marked interval improvement in the hypermetabolic disease identified previously in the posterior right nasopharynx, compatible with Deauville 4  today. 2. Decreased hypermetabolism in the right hilar and porta hepatis lymph nodes identified previously (Deauville 3). No new sites of hypermetabolic disease in the chest, abdomen, or pelvis. 3. Similar diffuse thyroid uptake, left greater than right. 4. No substantial change in right-sided pulmonary nodules." -04/09/2020 PET/CT ([id number]) revealed "No evidence progressive lymphoma."  2. Pulmonary Embolism -extensive likely related to extensive malignancy  3. H/o Breast Cancer  -The patient had bilateral diagnostic  mammography at [facility] 07/11/2011. This  showed some calcifications in the right  breast, which seemed a little bit more  prominent than prior. In the left breast  there was an area of possible  architectural distortion. However left  breast ultrasound the same day showed  no abnormality. 6 month followup was  suggested, and on 02/28/2012 the  patient again had bilateral diagnostic  mammography, now with right  ultrasonography. The microcalcifications  in the right breast appeared increased.  Ultrasound showed a hypoechoic lesion  measuring 5 mm, with no associated  shadowing. This had been previously  noted and appeared unchanged. Anterior  to that there was a small hypoechoic  mass measuring 4 mm in diameter.  There was felt to be suspicious, and on  03/14/2012 the patient underwent biopsy  of the right breast mass, showing ([id number]  [id number]) ductal carcinoma in situ,  intermediate grade, estrogen receptor  100% and progesterone receptor 100%  positive.    -Bilateral breast MRI was obtained  03/26/2012. This showed only post  biopsy changes in the right breast,  associated with an area of non-masslike  enhancement measuring  2.4 cm. The  left breast was unremarkable, and there  was no enlarged axillary or internal  mammary adenopathy noted.  Accordingly on 04/09/2012 the patient  underwent right lumpectomy, the  pathology ([id number]) showing ductal  carcinoma in situ measuring 2.0 cm,  grade  2, with negative margins, the  closest being 0.3 cm. The patient's  subsequent history is as detailed below.   PLAN: -Labs done today, were discussed in detail with the patient. Patient has no clear lab or clinical findings suggestive of significant progression of her angioimmunoblastic T-cell lymphoma- -no indication for additional treatment of the patient's lymphoma at this time. -Patient notes her weight is stable.  No shortness of breath or chest pain.  No symptoms of lymphoma progression at this time. -Review of chart patient was admitted in the hospital in June for weakness and in May for atypical chest pain. No recurrent symptoms at this time from this.  Follow-up RTC with Dr Irene Limbo with labs in 4 months  .  The total time spent in the appointment was 21 minutes*.  All of the patient's questions were answered with apparent satisfaction. The patient knows to call the clinic with any problems, questions or concerns.   Sullivan Lone MD MS AAHIVMS [facility] Hematology/Oncology Physician [facility]  .*Total Encounter Time as defined by the Centers for Medicare and Medicaid Services includes, in addition to the face-to-face time of a patient visit (documented in the note above) non-face-to-face time: obtaining and reviewing outside history, ordering and reviewing medications, tests or procedures, care coordination (communications with other health care professionals or caregivers) and documentation in the medical record.

## 2022-02-15 ENCOUNTER — Ambulatory Visit: Payer: Medicare Other | Attending: Cardiology | Admitting: Cardiology

## 2022-02-15 ENCOUNTER — Encounter: Payer: Medicare Other | Admitting: Cardiology

## 2022-02-15 ENCOUNTER — Encounter: Payer: Self-pay | Admitting: Cardiology

## 2022-02-15 VITALS — BP 128/68 | HR 60 | Ht 64.0 in | Wt 110.0 lb

## 2022-02-15 DIAGNOSIS — Z950 Presence of cardiac pacemaker: Secondary | ICD-10-CM | POA: Insufficient documentation

## 2022-02-15 DIAGNOSIS — I10 Essential (primary) hypertension: Secondary | ICD-10-CM | POA: Diagnosis not present

## 2022-02-15 DIAGNOSIS — I442 Atrioventricular block, complete: Secondary | ICD-10-CM | POA: Insufficient documentation

## 2022-02-15 NOTE — Progress Notes (Deleted)
Electrophysiology Office Follow up Visit Note:    Date:  02/15/2022   ID:  Sarah [name], DOB 08-24-1932, MRN [id number]  PCP:  Lajean Manes, [facility] Cardiologist:  Buford Dresser, MD  [facility] Electrophysiologist:  Vickie Epley, MD    Interval History:    Sarah [name] is a 88 y.o. female who presents for a follow up visit.  The patient has a history of permanent pacemaker implant November 01, 2021 for history of complete heart block.       Past Medical History:  Diagnosis Date   Allergy    codeine, thiazides   Anxiety    new dx   Arrhythmia    Arthritis    Atrioventricular block, Mobitz type 1, Wenckebach    Breast cancer ([place]) 03/14/2012   bx=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,ER/PR=+,upper inner quad   Cancer (HCC)    breast   Cataract    DVT (deep venous thrombosis) ([place]) 02/2019   left leg   Dyspnea    Edema    both legs feet and toe, abdomen   Glaucoma    laser treated years ago   HOH (hard of hearing)    Hyperlipemia    Hypertension    Hypothyroidism    Pancreatic cyst    benign   PONV (postoperative nausea and vomiting)    Pulmonary embolism ([place]) 02/2019   bilateral    Radiation 06/11/2012-07/12/2012   17 sessions 4250 cGy, 3 sessions 750 cGy   Vertigo    Wears glasses    Wears partial dentures    partial upper    Past Surgical History:  Procedure Laterality Date   ABDOMINAL HYSTERECTOMY  1966   1/2 ovary left in    [place]   lumpectomy-lt   CATARACT EXTRACTION     b/l   COLONOSCOPY     EXCISION MASS NECK Left 03/17/2019    EXCISION MASS NECK (Left Neck)   EXCISION MASS NECK Left 03/17/2019   Procedure: EXCISION MASS NECK;  Surgeon: Helayne Seminole, MD;  Location: [facility];  Service: ENT;  Laterality: Left;   EYE SURGERY Bilateral    bilateral cataract removal   IR IMAGING GUIDED PORT INSERTION  04/23/2019   JOINT REPLACEMENT  2013   rt total knee   JOINT REPLACEMENT   1995   lt total knee   PACEMAKER IMPLANT N/A 11/01/2021   Procedure: PACEMAKER IMPLANT;  Surgeon: Vickie Epley, MD;  Location: [facility];  Service: Cardiovascular;  Laterality: N/A;   PARTIAL MASTECTOMY WITH NEEDLE LOCALIZATION  04/09/2012   Procedure: PARTIAL MASTECTOMY WITH NEEDLE LOCALIZATION;  Surgeon: Adin Hector, MD;  Location: [facility];  Service: General;  Laterality: Right;   SKIN BIOPSY Left 03/17/2019   LEFT THIGH   SKIN BIOPSY Left 03/17/2019   Procedure: Skin Biopsy Left Thigh;  Surgeon: Helayne Seminole, MD;  Location: [facility];  Service: ENT;  Laterality: Left;   TONSILLECTOMY     TOTAL KNEE ARTHROPLASTY  08/16/2011   Procedure: TOTAL KNEE ARTHROPLASTY;  Surgeon: Ninetta Lights, MD;  Location: [facility];  Service: Orthopedics;  Laterality: Right;    Current Medications: No outpatient medications have been marked as taking for the 02/15/22 encounter (Appointment) with Vickie Epley, MD.     Allergies:   Alendronate sodium, Amoxicillin-pot clavulanate, Codeine, Lisinopril, Septra [sulfamethoxazole-trimethoprim], Thiazide-type diuretics, and Latex   Social History   Socioeconomic History   Marital  status: Widowed    Spouse name: Not on file   Number of children: 2   Years of education: Not on file   Highest education level: Not on file  Occupational History   Occupation: Retired    Comment: Community education officer   Tobacco Use   Smoking status: Never   Smokeless tobacco: Never  Vaping Use   Vaping Use: Never used  Substance and Sexual Activity   Alcohol use: No   Drug use: No   Sexual activity: Not Currently  Other Topics Concern   Not on file  Social History Narrative   Not on file   Social Determinants of Health   Financial Resource Strain: Not on file  Food Insecurity: Not on file  Transportation Needs: Not on file  Physical Activity: Not on file  Stress: Not on file  Social Connections: Not on file     Family  History: The patient's family history includes Cancer in her maternal aunt; Heart disease in her father.  ROS:   Please see the history of present illness.    All other systems reviewed and are negative.  EKGs/Labs/Other Studies Reviewed:    The following studies were reviewed today:  February 15, 2022 in clinic device interrogation personally reviewed ***    Recent Labs: 11/01/2021: TSH 1.225 11/04/2021: Magnesium 2.1 02/08/2022: ALT 15; BUN 28; Creatinine 0.68; Hemoglobin 13.3; Platelet Count 118; Potassium 3.5; Sodium 143  Recent Lipid Panel    Component Value Date/Time   CHOL 189 05/16/2021 0805   TRIG 75 05/16/2021 0805   HDL 76 05/16/2021 0805   CHOLHDL 2.5 05/16/2021 0805   VLDL 15 05/16/2021 0805   LDLCALC 98 05/16/2021 0805    Physical Exam:    VS:  There were no vitals taken for this visit.    Wt Readings from Last 3 Encounters:  02/08/22 109 lb 8 oz (49.7 kg)  12/06/21 111 lb (50.3 kg)  11/02/21 109 lb 9.6 oz (49.7 kg)     GEN: *** Well nourished, well developed in no acute distress HEENT: Normal NECK: No JVD; No carotid bruits LYMPHATICS: No lymphadenopathy CARDIAC: ***RRR, no murmurs, rubs, gallops.  Pacemaker pocket well-healed. RESPIRATORY:  Clear to auscultation without rales, wheezing or rhonchi  ABDOMEN: Soft, non-tender, non-distended MUSCULOSKELETAL:  No edema; No deformity  SKIN: Warm and dry NEUROLOGIC:  Alert and oriented x 3 PSYCHIATRIC:  Normal affect        ASSESSMENT:    1. Heart block AV complete ([place])   2. Primary hypertension   3. Cardiac pacemaker in situ    PLAN:    In order of problems listed above:  #Complete heart block #Pacemaker in situ Device functioning appropriately.  Continue remote monitoring.  #Hypertension *** goal today.  Recommend checking blood pressures 1-2 times per week at home and recording the values.  Recommend bringing these recordings to the primary care physician.   Follow-up 1 year with  APP.  Medication Adjustments/Labs and Tests Ordered: Current medicines are reviewed at length with the patient today.  Concerns regarding medicines are outlined above.  No orders of the defined types were placed in this encounter.  No orders of the defined types were placed in this encounter.    Signed, Lars Mage, MD, [facility], [facility] 02/15/2022 5:51 AM    Electrophysiology [facility]

## 2022-02-15 NOTE — Progress Notes (Signed)
Electrophysiology Office Follow up Visit Note:    Date:  02/15/2022   ID:  Sarah [name], DOB 02/16/1933, MRN [id number]  PCP:  Lajean Manes, [facility] Cardiologist:  Buford Dresser, MD  [facility] Electrophysiologist:  Vickie Epley, MD    Interval History:    Sarah [name] is a 88 y.o. female who presents for a follow up visit.  The patient has a history of permanent pacemaker implant November 01, 2021 for history of complete heart block.  Today:  He incision is healing well.   She denies any palpitations, chest pain, shortness of breath, or peripheral edema. No headaches, syncope, orthopnea, or PND.   Past Medical History:  Diagnosis Date   Allergy    codeine, thiazides   Anxiety    new dx   Arrhythmia    Arthritis    Atrioventricular block, Mobitz type 1, Wenckebach    Breast cancer ([place]) 03/14/2012   bx=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,ER/PR=+,upper inner quad   Cancer (HCC)    breast   Cataract    DVT (deep venous thrombosis) ([place]) 02/2019   left leg   Dyspnea    Edema    both legs feet and toe, abdomen   Glaucoma    laser treated years ago   HOH (hard of hearing)    Hyperlipemia    Hypertension    Hypothyroidism    Pancreatic cyst    benign   PONV (postoperative nausea and vomiting)    Pulmonary embolism ([place]) 02/2019   bilateral    Radiation 06/11/2012-07/12/2012   17 sessions 4250 cGy, 3 sessions 750 cGy   Vertigo    Wears glasses    Wears partial dentures    partial upper    Past Surgical History:  Procedure Laterality Date   ABDOMINAL HYSTERECTOMY  1966   1/2 ovary left in    [place]   lumpectomy-lt   CATARACT EXTRACTION     b/l   COLONOSCOPY     EXCISION MASS NECK Left 03/17/2019    EXCISION MASS NECK (Left Neck)   EXCISION MASS NECK Left 03/17/2019   Procedure: EXCISION MASS NECK;  Surgeon: Helayne Seminole, MD;  Location: [facility];  Service: ENT;  Laterality: Left;    EYE SURGERY Bilateral    bilateral cataract removal   IR IMAGING GUIDED PORT INSERTION  04/23/2019   JOINT REPLACEMENT  2013   rt total knee   JOINT REPLACEMENT  1995   lt total knee   PACEMAKER IMPLANT N/A 11/01/2021   Procedure: PACEMAKER IMPLANT;  Surgeon: Vickie Epley, MD;  Location: [facility];  Service: Cardiovascular;  Laterality: N/A;   PARTIAL MASTECTOMY WITH NEEDLE LOCALIZATION  04/09/2012   Procedure: PARTIAL MASTECTOMY WITH NEEDLE LOCALIZATION;  Surgeon: Adin Hector, MD;  Location: [facility];  Service: General;  Laterality: Right;   SKIN BIOPSY Left 03/17/2019   LEFT THIGH   SKIN BIOPSY Left 03/17/2019   Procedure: Skin Biopsy Left Thigh;  Surgeon: Helayne Seminole, MD;  Location: [facility];  Service: ENT;  Laterality: Left;   TONSILLECTOMY     TOTAL KNEE ARTHROPLASTY  08/16/2011   Procedure: TOTAL KNEE ARTHROPLASTY;  Surgeon: Ninetta Lights, MD;  Location: [facility];  Service: Orthopedics;  Laterality: Right;    Current Medications: Current Meds  Medication Sig   acetaminophen (TYLENOL) 500 MG tablet Take 1,000 mg by mouth every 6 (six) hours as needed for  moderate pain.   amLODipine (NORVASC) 2.5 MG tablet Take 2.5 mg by mouth daily.   apixaban (ELIQUIS) 2.5 MG TABS tablet Take 1 tablet (2.5 mg total) by mouth 2 (two) times daily. Resume Sunday with the evening dose.   atorvastatin (LIPITOR) 20 MG tablet Take 1 tablet (20 mg total) by mouth daily. (Patient taking differently: Take 20 mg by mouth every evening.)   carvedilol (COREG) 6.25 MG tablet Take 1 tablet (6.25 mg total) by mouth 2 (two) times daily with a meal.   citalopram (CELEXA) 10 MG tablet Take 10 mg by mouth daily.   furosemide (LASIX) 20 MG tablet Take 20-40 mg by mouth See admin instructions. 40 mg on Monday,Wednesday and Friday 20 mg on Tuesday,Thursday,Saturday and sunday   levothyroxine (SYNTHROID) 25 MCG tablet Take 25 mcg by mouth daily before breakfast.     lidocaine-prilocaine (EMLA) cream Apply to affected area once (Patient taking differently: Apply 1 application  topically See admin instructions. Apply to port site prior to port access)   omeprazole (PRILOSEC) 20 MG capsule Take 1 capsule (20 mg total) by mouth daily.   Polyethyl Glycol-Propyl Glycol (SYSTANE) 0.4-0.3 % GEL ophthalmic gel Place 1 application into both eyes daily as needed (dry eyes).   sodium chloride (MURO 128) 5 % ophthalmic ointment Place 1 application  into both eyes at bedtime as needed for eye irritation.   zolpidem (AMBIEN) 5 MG tablet Take 5 mg by mouth at bedtime.     Allergies:   Alendronate sodium, Amoxicillin-pot clavulanate, Codeine, Lisinopril, Septra [sulfamethoxazole-trimethoprim], Thiazide-type diuretics, and Latex   Social History   Socioeconomic History   Marital status: Widowed    Spouse name: Not on file   Number of children: 2   Years of education: Not on file   Highest education level: Not on file  Occupational History   Occupation: Retired    Comment: Community education officer   Tobacco Use   Smoking status: Never   Smokeless tobacco: Never  Vaping Use   Vaping Use: Never used  Substance and Sexual Activity   Alcohol use: No   Drug use: No   Sexual activity: Not Currently  Other Topics Concern   Not on file  Social History Narrative   Not on file   Social Determinants of Health   Financial Resource Strain: Not on file  Food Insecurity: Not on file  Transportation Needs: Not on file  Physical Activity: Not on file  Stress: Not on file  Social Connections: Not on file     Family History: The patient's family history includes Cancer in her maternal aunt; Heart disease in her father.  ROS:   Please see the history of present illness.     (+) Dizziness (+) Lightheadedness  All other systems reviewed and are negative.  EKGs/Labs/Other Studies Reviewed:    The following studies were reviewed today:   February 15, 2022 in clinic  device interrogation personally reviewed Lead parameters stable Battery longevity okay Turn capture Control on today No atrial fibrillation episodes      EKG: EKG is personally reviewed 02/15/22: No EKG Ordered  Recent Labs: 11/01/2021: TSH 1.225 11/04/2021: Magnesium 2.1 02/08/2022: ALT 15; BUN 28; Creatinine 0.68; Hemoglobin 13.3; Platelet Count 118; Potassium 3.5; Sodium 143  Recent Lipid Panel    Component Value Date/Time   CHOL 189 05/16/2021 0805   TRIG 75 05/16/2021 0805   HDL 76 05/16/2021 0805   CHOLHDL 2.5 05/16/2021 0805   VLDL 15 05/16/2021 0805  [place] 98 05/16/2021 0805    Physical Exam:    VS:  BP 128/68   Pulse 60   Ht 5\' 4"  (1.626 m)   Wt 110 lb (49.9 kg)   SpO2 97%   BMI 18.88 kg/m     Wt Readings from Last 3 Encounters:  02/15/22 110 lb (49.9 kg)  02/08/22 109 lb 8 oz (49.7 kg)  12/06/21 111 lb (50.3 kg)     GEN:  Well nourished, well developed in no acute distress HEENT: Normal NECK: No JVD; No carotid bruits LYMPHATICS: No lymphadenopathy CARDIAC: RRR, no murmurs, rubs, gallops.  Pacemaker pocket well-healed. RESPIRATORY:  Clear to auscultation without rales, wheezing or rhonchi  ABDOMEN: Soft, non-tender, non-distended MUSCULOSKELETAL:  No edema; No deformity  SKIN: Warm and dry NEUROLOGIC:  Alert and oriented x 3 PSYCHIATRIC:  Normal affect        ASSESSMENT:    1. Heart block AV complete ([place])   2. Primary hypertension   3. Cardiac pacemaker in situ    PLAN:    In order of problems listed above:  #Complete heart block #Pacemaker in situ Device functioning appropriately.  Continue remote monitoring.  #Hypertension At goal today.  Recommend checking blood pressures 1-2 times per week at home and recording the values.  Recommend bringing these recordings to the primary care physician.   Follow-up 1 year with APP.   Medication Adjustments/Labs and Tests Ordered: Current medicines are reviewed at length with the  patient today.  Concerns regarding medicines are outlined above.  No orders of the defined types were placed in this encounter.  No orders of the defined types were placed in this encounter.   I,Mary Mosetta Pigeon Buren,acting as a scribe for Vickie Epley, MD.,have documented all relevant documentation on the behalf of Vickie Epley, MD,as directed by  Vickie Epley, MD while in the presence of Vickie Epley, MD.   I, Vickie Epley, MD, have reviewed all documentation for this visit. The documentation on 02/15/22 for the exam, diagnosis, procedures, and orders are all accurate and complete.   Signed, Sarah Mage, MD, [facility] 02/15/2022 3:47 PM    Electrophysiology [facility]

## 2022-02-15 NOTE — Patient Instructions (Signed)
Medication Instructions:  No changes  *If you need a refill on your cardiac medications before your next appointment, please call your pharmacy*   Lab Work: none If you have labs (blood work) drawn today and your tests are completely normal, you will receive your results only by: [place] (if you have MyChart) OR A paper copy in the mail If you have any lab test that is abnormal or we need to change your treatment, we will call you to review the results.   Testing/Procedures: none   Follow-Up: At [facility], you and your health needs are our priority.  As part of our continuing mission to provide you with exceptional heart care, we have created designated Provider Care Teams.  These Care Teams include your primary Cardiologist (physician) and Advanced Practice Providers (APPs -  Physician Assistants and Nurse Practitioners) who all work together to provide you with the care you need, when you need it.  We recommend signing up for the patient portal called "MyChart".  Sign up information is provided on this After Visit Summary.  MyChart is used to connect with patients for Virtual Visits (Telemedicine).  Patients are able to view lab/test results, encounter notes, upcoming appointments, etc.  Non-urgent messages can be sent to your provider as well.   To learn more about what you can do with MyChart, go to [email or web address].    Your next appointment:   1 year(s)  The format for your next appointment:   In Person  Provider:   You will see one of the following Advanced Practice Providers on your designated Care Team:   Tommye Standard, [place] Legrand Como "Jonni Sanger" Chalmers Cater, [place]      Other Instructions none  Important Information About [place]

## 2022-02-16 NOTE — Progress Notes (Signed)
Remote pacemaker transmission.   

## 2022-02-22 ENCOUNTER — Emergency Department (HOSPITAL_COMMUNITY)
Admission: EM | Admit: 2022-02-22 | Discharge: 2022-02-22 | Disposition: A | Discharge status: Home / Self Care | Payer: Medicare Other | Attending: Emergency Medicine | Admitting: Emergency Medicine

## 2022-02-22 ENCOUNTER — Emergency Department (HOSPITAL_COMMUNITY): Payer: Medicare Other

## 2022-02-22 ENCOUNTER — Encounter (HOSPITAL_COMMUNITY): Payer: Self-pay

## 2022-02-22 ENCOUNTER — Ambulatory Visit: Payer: Medicare Other | Admitting: Podiatry

## 2022-02-22 ENCOUNTER — Other Ambulatory Visit: Payer: Self-pay

## 2022-02-22 DIAGNOSIS — W19XXXA Unspecified fall, initial encounter: Secondary | ICD-10-CM

## 2022-02-22 DIAGNOSIS — Z79899 Other long term (current) drug therapy: Secondary | ICD-10-CM | POA: Insufficient documentation

## 2022-02-22 DIAGNOSIS — E039 Hypothyroidism, unspecified: Secondary | ICD-10-CM | POA: Insufficient documentation

## 2022-02-22 DIAGNOSIS — S80919A Unspecified superficial injury of unspecified knee, initial encounter: Secondary | ICD-10-CM | POA: Diagnosis not present

## 2022-02-22 DIAGNOSIS — I10 Essential (primary) hypertension: Secondary | ICD-10-CM | POA: Insufficient documentation

## 2022-02-22 DIAGNOSIS — Y9301 Activity, walking, marching and hiking: Secondary | ICD-10-CM | POA: Diagnosis not present

## 2022-02-22 DIAGNOSIS — I517 Cardiomegaly: Secondary | ICD-10-CM | POA: Diagnosis not present

## 2022-02-22 DIAGNOSIS — S0990XA Unspecified injury of head, initial encounter: Secondary | ICD-10-CM | POA: Diagnosis not present

## 2022-02-22 DIAGNOSIS — Z7901 Long term (current) use of anticoagulants: Secondary | ICD-10-CM | POA: Insufficient documentation

## 2022-02-22 DIAGNOSIS — Z91040 Latex allergy status: Secondary | ICD-10-CM | POA: Insufficient documentation

## 2022-02-22 DIAGNOSIS — S0003XA Contusion of scalp, initial encounter: Secondary | ICD-10-CM | POA: Diagnosis not present

## 2022-02-22 DIAGNOSIS — I700 Atherosclerosis of aorta: Secondary | ICD-10-CM | POA: Diagnosis not present

## 2022-02-22 DIAGNOSIS — S0083XA Contusion of other part of head, initial encounter: Secondary | ICD-10-CM | POA: Diagnosis not present

## 2022-02-22 DIAGNOSIS — W1809XA Striking against other object with subsequent fall, initial encounter: Secondary | ICD-10-CM | POA: Insufficient documentation

## 2022-02-22 DIAGNOSIS — S3993XA Unspecified injury of pelvis, initial encounter: Secondary | ICD-10-CM | POA: Diagnosis not present

## 2022-02-22 DIAGNOSIS — R410 Disorientation, unspecified: Secondary | ICD-10-CM | POA: Diagnosis not present

## 2022-02-22 DIAGNOSIS — Z950 Presence of cardiac pacemaker: Secondary | ICD-10-CM | POA: Diagnosis not present

## 2022-02-22 DIAGNOSIS — M4802 Spinal stenosis, cervical region: Secondary | ICD-10-CM | POA: Diagnosis not present

## 2022-02-22 DIAGNOSIS — Z853 Personal history of malignant neoplasm of breast: Secondary | ICD-10-CM | POA: Diagnosis not present

## 2022-02-22 DIAGNOSIS — Z043 Encounter for examination and observation following other accident: Secondary | ICD-10-CM | POA: Diagnosis not present

## 2022-02-22 LAB — SAMPLE TO BLOOD BANK

## 2022-02-22 LAB — COMPREHENSIVE METABOLIC PANEL
ALT: 15 U/L (ref 0–44)
AST: 23 U/L (ref 15–41)
Albumin: 3.9 g/dL (ref 3.5–5.0)
Alkaline Phosphatase: 61 U/L (ref 38–126)
Anion gap: 9 (ref 5–15)
BUN: 20 mg/dL (ref 8–23)
CO2: 27 mmol/L (ref 22–32)
Calcium: 9.6 mg/dL (ref 8.9–10.3)
Chloride: 107 mmol/L (ref 98–111)
Creatinine, Ser: 0.75 mg/dL (ref 0.44–1.00)
GFR, Estimated: >60 mL/min (ref >60)
Glucose, Bld: 104 mg/dL — ABNORMAL HIGH (ref 70–99)
Potassium: 3.7 mmol/L (ref 3.5–5.1)
Sodium: 143 mmol/L (ref 135–145)
Total Bilirubin: 0.9 mg/dL (ref 0.3–1.2)
Total Protein: 6.4 g/dL — ABNORMAL LOW (ref 6.5–8.1)

## 2022-02-22 LAB — CBC WITH DIFFERENTIAL/PLATELET
Abs Immature Granulocytes: 0.02 10*3/uL (ref 0.00–0.07)
Basophils Absolute: 0 10*3/uL (ref 0.0–0.1)
Basophils Relative: 1 %
Eosinophils Absolute: 0.1 10*3/uL (ref 0.0–0.5)
Eosinophils Relative: 3 %
HCT: 42.3 % (ref 36.0–46.0)
Hemoglobin: 14 g/dL (ref 12.0–15.0)
Immature Granulocytes: 1 %
Lymphocytes Relative: 13 %
Lymphs Abs: 0.5 10*3/uL — ABNORMAL LOW (ref 0.7–4.0)
MCH: 31.2 pg (ref 26.0–34.0)
MCHC: 33.1 g/dL (ref 30.0–36.0)
MCV: 94.2 fL (ref 80.0–100.0)
Monocytes Absolute: 0.5 10*3/uL (ref 0.1–1.0)
Monocytes Relative: 11 %
Neutro Abs: 3 10*3/uL (ref 1.7–7.7)
Neutrophils Relative %: 71 %
Platelets: 131 10*3/uL — ABNORMAL LOW (ref 150–400)
RBC: 4.49 MIL/uL (ref 3.87–5.11)
RDW: 13.1 % (ref 11.5–15.5)
WBC: 4.2 10*3/uL (ref 4.0–10.5)
nRBC: 0 % (ref 0.0–0.2)

## 2022-02-22 LAB — URINALYSIS, ROUTINE W REFLEX MICROSCOPIC
Bilirubin Urine: NEGATIVE
Glucose, UA: NEGATIVE mg/dL
Ketones, ur: NEGATIVE mg/dL
Leukocytes,Ua: NEGATIVE
Nitrite: NEGATIVE
Protein, ur: NEGATIVE mg/dL
Specific Gravity, Urine: 1.012 (ref 1.005–1.030)
pH: 7 (ref 5.0–8.0)

## 2022-02-22 LAB — ETHANOL: Alcohol, Ethyl (B): <10 mg/dL (ref <10)

## 2022-02-22 LAB — PROTIME-INR
INR: 1.2 (ref 0.8–1.2)
Prothrombin Time: 15.5 seconds — ABNORMAL HIGH (ref 11.4–15.2)

## 2022-02-22 LAB — LACTIC ACID, PLASMA: Lactic Acid, Venous: 1.4 mmol/L (ref 0.5–1.9)

## 2022-02-22 NOTE — ED Triage Notes (Signed)
Pt arrives via EMS from an [employer]. Pt had a mechanical fall when using her walker. Pt did hit her head, no loc. She is on Eliquis. Pt is alert and oriented to name and dob only. This is her baseline. Hematoma and abrasion noted to left side of forehead.

## 2022-02-22 NOTE — Discharge Instructions (Addendum)
Thank you for coming to [facility] Emergency Department. You were seen for a fall. We did an exam, labs, and imaging, and these showed a hematoma to your scalp. You can take tylenol as needed for pain. Please follow up with your primary care provider within 1 week.   Do not hesitate to return to the ED or call 911 if you experience: -Worsening symptoms -Lightheadedness, passing out -Fevers/chills -Anything else that concerns you

## 2022-02-22 NOTE — ED Provider Notes (Signed)
[facility] EMERGENCY DEPARTMENT Provider Note   CSN: [id number] Arrival date & time: 02/22/22  1129     History  Chief Complaint  Patient presents with   Trauma    Fall on Eliquis    RENISE GILLIES is a 88 y.o. female with angioimmunoblastic T-cell lymphoma in remission, dementia, HTN, hypothyroidism, HLD, history of DVT/PE on eliquis, second-degree AV block status post pacemaker placement June 2023, history of CVA, GERD, h/o breast cancer s/p partial mastectomy, and chronic thrombocytopenia presents BIBEMS as a level 2 trauma with fall and head injury from her skilled nursing facility.  Patient was walking in her kitchen this morning with her walker and fell to the ground, striking her head.  Reports she tripped but can't remember on what. Patient has dementia at baseline and is a poor historian.  At this time she denies any pain anywhere, even headache, denies neck pain chest pain abdominal pain or any pain in her extremities.  Patient is unsure if she lost consciousness.  EMS noted a hematoma to her forehead and was placed in a c-collar.  EMS gave 100 mcg mics of fentanyl. Per chart review, multiple recent clinic notes document patient A&Ox3, but currently she is only A&Ox1.  Patient takes Eliquis for history of DVT/PE.   HPI     Home Medications Prior to Admission medications   Medication Sig Start Date End Date Taking? Authorizing Provider  acetaminophen (TYLENOL) 500 MG tablet Take 1,000 mg by mouth every 6 (six) hours as needed for moderate pain.   Yes [provider]  amLODipine (NORVASC) 2.5 MG tablet Take 2.5 mg by mouth daily. 01/21/22  Yes [provider]  apixaban (ELIQUIS) 2.5 MG TABS tablet Take 1 tablet (2.5 mg total) by mouth 2 (two) times daily. Resume Sunday with the evening dose. 11/06/21  Yes Shirley Friar, PA-C  atorvastatin (LIPITOR) 20 MG tablet Take 1 tablet (20 mg total) by mouth daily. Patient taking differently:  Take 20 mg by mouth every evening. 05/18/21  Yes Ghimire, Henreitta Leber, MD  carvedilol (COREG) 6.25 MG tablet Take 1 tablet (6.25 mg total) by mouth 2 (two) times daily with a meal. 11/02/21  Yes Tillery, Satira Mccallum, PA-C  citalopram (CELEXA) 10 MG tablet Take 10 mg by mouth daily. 02/02/21  Yes [provider]  furosemide (LASIX) 20 MG tablet Take 20-40 mg by mouth See admin instructions. 40 mg on Monday,Wednesday and Friday 20 mg on Tuesday,Thursday,Saturday and sunday   Yes [provider]  levothyroxine (SYNTHROID) 25 MCG tablet Take 25 mcg by mouth daily before breakfast.  09/20/18  Yes [provider]  lidocaine-prilocaine (EMLA) cream Apply to affected area once Patient taking differently: Apply 1 application  topically See admin instructions. Apply to port site prior to port access 04/13/20  Yes Brunetta Genera, MD  omeprazole (PRILOSEC) 20 MG capsule Take 1 capsule (20 mg total) by mouth daily. 10/11/21  Yes Kommor, Madison, MD  Polyethyl Glycol-Propyl Glycol (SYSTANE) 0.4-0.3 % GEL ophthalmic gel Place 1 application into both eyes daily as needed (dry eyes).   Yes [provider]  sodium chloride (MURO 128) 5 % ophthalmic ointment Place 1 application  into both eyes at bedtime as needed for eye irritation.   Yes [provider]  zolpidem (AMBIEN) 5 MG tablet Take 5 mg by mouth at bedtime. 12/07/19  Yes [provider]      Allergies    Alendronate sodium, Amoxicillin-pot clavulanate, Codeine, Lisinopril,  Septra [sulfamethoxazole-trimethoprim], Thiazide-type diuretics, and Latex    Review of Systems   Review of Systems Review of systems positive for head strike.  A 10 point review of systems was performed and is negative unless otherwise reported in HPI.  Physical Exam Updated Vital Signs BP (!) 147/88   Pulse (!) 59   Temp 98 F (36.7 C)   Resp 12   Ht 5\' 4"  (1.626 m)   Wt 49.9 kg   SpO2 92%   BMI 18.88 kg/m  Physical  Exam  PRIMARY SURVEY  Airway Airway intact  Breathing Bilateral breath sounds  Circulation Carotid/femoral pulses 2+ intact bilaterally  GCS E =  4 V =  4 M =  6 Total = 14  Environment All clothes removed      SECONDARY SURVEY  Gen: -NAD  HEENT: -Head: NCAT. Scalp is clear of lacerations or wounds. Skull is clear of deformities or depressions -Forehead: 4x4 cm left forehead hematoma with overlying abrasion -Midface: Stable -Eyes: No visible injury to eyelids or eye, PERRL, EOMI -Nose: No gross deformities, no septal hematoma -Mouth: No injuries to lips, tongue or teeth. No trismus or malposition -Ears: No hemotympanum, no auricular hematoma -Neck: Trachea is midline, no distended neck veins  Chest: -No tenderness, deformities, bruising or crepitus to clavicles or chest -Normal chest expansion -Normal heart sounds, S1/S2 normal, no m/r/g -No wheezes, rales, rhonchi  Abdomen: -No tenderness, bruising or penetrating injury  Pelvis: -Pelvis is stable and non-tender  Genital:  -No gross deformity or visible injury to perineum or external genitalia -No blood at urethral meatus -No incontinence  Extremities: Right Upper Extremity: -No point tenderness, deformity or other signs of injury -Radial pulse intact RUE, cap refill good -Normal sensation -Normal ROM, good strength Left Upper Extremity: -No point tenderness, deformity or other signs of injury -Radial pulse intact LUE, cap refill good -Normal sensation -Normal ROM, good strength Right Lower Extremity: -No point tenderness, deformity or other signs of injury -DP intact RLE -Normal sensation -Normal ROM, good strength Left Lower Extremity: -No point tenderness, deformity or other signs of injury -DP intact LLE -Normal sensation -Normal ROM, good strength  Back/Spine: -No CT or L-spine tenderness or step-offs -Rectal: good tone, no gross blood -Collar: EMS collar in place  Other: N/A     ED Results / Procedures /  Treatments   Labs (all labs ordered are listed, but only abnormal results are displayed) Labs Reviewed  COMPREHENSIVE METABOLIC PANEL - Abnormal; Notable for the following components:      Result Value   Glucose, Bld 104 (*)    Total Protein 6.4 (*)    All other components within normal limits  URINALYSIS, ROUTINE W REFLEX MICROSCOPIC - Abnormal; Notable for the following components:   Hgb urine dipstick SMALL (*)    Bacteria, UA RARE (*)    All other components within normal limits  PROTIME-INR - Abnormal; Notable for the following components:   Prothrombin Time 15.5 (*)    All other components within normal limits  CBC WITH DIFFERENTIAL/PLATELET - Abnormal; Notable for the following components:   Platelets 131 (*)    Lymphs Abs 0.5 (*)    All other components within normal limits  ETHANOL  LACTIC ACID, PLASMA  SAMPLE TO BLOOD BANK    Radiology CT CERVICAL SPINE WO CONTRAST  Result Date: 02/22/2022 CLINICAL DATA:  Fall. EXAM: CT CERVICAL SPINE WITHOUT CONTRAST TECHNIQUE: Multidetector CT imaging of the cervical spine was performed without intravenous contrast. Multiplanar  CT image reconstructions were also generated. RADIATION DOSE REDUCTION: This exam was performed according to the departmental dose-optimization program which includes automated exposure control, adjustment of the mA and/or kV according to patient size and/or use of iterative reconstruction technique. COMPARISON:  CT of the cervical spine 03/23/2021 FINDINGS: Alignment: Slight degenerative anterolisthesis at C3-4 scratched at slight degenerative anterolisthesis at C4-5 C7-T1 and T1-2 is stable. Straightening and slight reversal of the normal cervical lordosis is similar the prior exam. Skull base and vertebrae: Craniocervical junction is within normal limits. Degenerative changes are noted at C1-2. Vertebral body heights are maintained. No acute or healing fractures are present. Soft tissues and spinal canal: No  prevertebral fluid or swelling. No visible canal hematoma. Disc levels: Ankylosis present anteriorly and posteriorly at C2-3 and C3-4. Facet hypertrophy and uncovertebral spurring result in moderate foraminal stenosis at C5-6, left greater than right. More severe left foraminal stenosis is present at C6-7. Upper chest: Stable dependent atelectasis or scarring is present at both lung apices. IMPRESSION: 1. No acute fracture or traumatic subluxation. 2. Multilevel degenerative changes of the cervical spine are stable. Electronically Signed   By: San Morelle M.D.   On: 02/22/2022 12:36   CT HEAD WO CONTRAST  Result Date: 02/22/2022 CLINICAL DATA:  Head trauma, moderate to severe.  Fall. EXAM: CT HEAD WITHOUT CONTRAST TECHNIQUE: Contiguous axial images were obtained from the base of the skull through the vertex without intravenous contrast. RADIATION DOSE REDUCTION: This exam was performed according to the departmental dose-optimization program which includes automated exposure control, adjustment of the mA and/or kV according to patient size and/or use of iterative reconstruction technique. COMPARISON:  CT head without contrast 07/31/2021. MR head without and with contrast 08/17/2021. FINDINGS: Brain: Asymmetric hypoattenuation is noted in the mesial right temporal lobe as seen on prior MRIs. Focal hypoattenuation is also present in the right parietal lobe compatible with areas of previous cortical and subcortical signal change on the MRI scans. No new areas of involvement are present. Moderate generalized atrophy is stable. The ventricles are of normal size. No significant extraaxial fluid collection is present. The brainstem and cerebellum are within normal limits. Vascular: Faint atherosclerotic calcifications are present within the cavernous internal carotid arteries. No hyperdense vessel is present. Skull: Left supraorbital scratched at left supraorbital frontal scalp hematoma and soft tissue swelling  is present without underlying fracture. No foreign body is present. No other significant scalp injury is present. Calvarium is intact. Sinuses/Orbits: The paranasal sinuses and mastoid air cells are clear. Bilateral lens replacements are noted. Globes and orbits are otherwise unremarkable. IMPRESSION: 1. Left supraorbital frontal scalp hematoma and soft tissue swelling without underlying fracture. 2. No acute intracranial abnormality. 3. Stable atrophy and white matter disease. This likely reflects the sequela of chronic microvascular ischemia. 4. Stable focal hypoattenuation involving the mesial right temporal lobe and right parietal lobe. This is been stable over time, most concerning for low grade tumor. Electronically Signed   By: San Morelle M.D.   On: 02/22/2022 12:32   DG Pelvis Portable  Result Date: 02/22/2022 CLINICAL DATA:  Trauma, fall EXAM: PORTABLE PELVIS 1-2 VIEWS COMPARISON:  None Available. FINDINGS: No fracture or dislocation is seen. Degenerative changes are noted with bony spurs in both hips, more so on the right side. Degenerative changes are noted in visualized lower lumbar spine. IMPRESSION: No recent displaced fracture or dislocation is seen. Electronically Signed   By: Elmer Picker M.D.   On: 02/22/2022 11:51   DG Chest  Port 1 View  Result Date: 02/22/2022 CLINICAL DATA:  Fall EXAM: PORTABLE CHEST 1 VIEW COMPARISON:  11/02/2021 FINDINGS: Right-sided chest port and left-sided implanted cardiac device are unchanged in positioning. Stable cardiomegaly. Aortic atherosclerosis. No focal airspace consolidation, pleural effusion, or pneumothorax. Multiple surgical clips project over the right chest wall. No acute bony findings are seen. IMPRESSION: No active disease. Electronically Signed   By: Davina Poke D.O.   On: 02/22/2022 11:49    Procedures Procedures    Medications Ordered in ED Medications - No data to display  ED Course/ Medical Decision Making/  A&P                          Medical Decision Making Amount and/or Complexity of Data Reviewed Labs: ordered. Decision-making details documented in ED Course. Radiology: ordered. Decision-making details documented in ED Course.    DDX for trauma includes but is not limited to:  -Head Injury such as skull fx or ICH; spinal Cord or vertebral injury-patient with with no report of neck pain or tenderness palpation however she has dementia at baseline but is A&O x1 at this time.  Will obtain CT brain and C-spine -Airway compromise/injury -trachea midline, bilateral breath sounds, no respiratory distress, no hypoxia -Chest Injury and Abdominal Injury -no chest or abdominal tenderness palpation -Vascular compromise/injury  -Hemorrhage -vital stable, no report of blood on scene, low concern for life-threatening hemorrhage at this time -Fractures -no complaint of pain in her extremities, physical exam reassuring, will obtain chest and pelvis x-ray   I have personally reviewed and interpreted all labs and imaging.   Clinical Course as of 02/22/22 2343  Wed Feb 22, 2022  1226 DG Pelvis Portable No recent displaced fracture or dislocation is seen. [HN]  1226 DG Chest Port 1 View No active disease. [HN]  1322 Lactic Acid, Venous: 1.4 [HN]  1322 Comprehensive metabolic panel(!) Unremarkable [HN]  1426 CT CERVICAL SPINE WO CONTRAST 1. No acute fracture or traumatic subluxation. 2. Multilevel degenerative changes of the cervical spine are stable.   [HN]  1426 CT HEAD WO CONTRAST 1. Left supraorbital frontal scalp hematoma and soft tissue swelling without underlying fracture. 2. No acute intracranial abnormality. 3. Stable atrophy and white matter disease. This likely reflects the sequela of chronic microvascular ischemia. 4. Stable focal hypoattenuation involving the mesial right temporal lobe and right parietal lobe. This is been stable over time, most concerning for low grade tumor.    [HN]    Clinical Course User Index [HN] Audley Hose, MD    Patient's daughter now at bedside.  Patient's work-up reassuring. Daughter and patient were already aware of pre-existing and stable brain tumor on imaging.  Patient c-collar is cleared and daughter states that patient is acting at her baseline. Patient is taking PO. Patient is offered a compressive dressing for her hematoma on her forehead but she declines.  Discussed fall precautions. Patient will be discharged with discharge instructions and return precautions.  Instructed to follow-up with PCP within 1 week.  Dispo: DC         Final Clinical Impression(s) / ED Diagnoses Final diagnoses:  Fall, initial encounter  Contusion of scalp, initial encounter  Traumatic hematoma of forehead, initial encounter    Rx / DC Orders ED Discharge Orders     None        This note was created using dictation software, which may contain spelling or grammatical errors.  Audley Hose, MD 02/22/22 [phone]

## 2022-02-22 NOTE — Progress Notes (Signed)
Orthopedic Tech Progress Note Patient Details:  Sarah [name] [birth year] [id number]  Level 2 trauma   Patient ID: Sarah [name], female   DOB: 20-Aug-1932, 88 y.o.   MRN: [id number]  Janit Pagan 02/22/2022, 11:57 AM

## 2022-02-24 ENCOUNTER — Telehealth: Payer: Self-pay | Admitting: Hematology

## 2022-02-24 NOTE — Telephone Encounter (Signed)
Left message with follow-up appointment per 9/20 los.

## 2022-03-08 DIAGNOSIS — F0781 Postconcussional syndrome: Secondary | ICD-10-CM | POA: Diagnosis not present

## 2022-03-08 DIAGNOSIS — G939 Disorder of brain, unspecified: Secondary | ICD-10-CM | POA: Diagnosis not present

## 2022-03-08 NOTE — Progress Notes (Addendum)
Cardiology Office Note:    Date:  03/15/2022   ID:  Sarah [name], DOB April 24, 1933, MRN [id number]  PCP:  [facility]  Cardiologist:  Buford Dresser, MD  Electrophysiologist:  Vickie Epley, MD   Referring MD: Lajean Manes, MD   Chief Complaint: follow-up of complete heart block  History of Present Illness:    Sarah [name] is a 88 y.o. female with a history of Sarah [name] is a 88 y.o. female with a history of complete heart block s/p implantation of dual chamber Biotronik PPM in 10/2021, PE/DVT in 02/2019 on Eliquis, stroke in 04/2021, hypertension, hyperlipidemia, hypothyroidism, breast cancer in 2013, and T cell lymphoma who is followed by Dr. Harrell Gave and presents today for routine follow-up.   Patient was first seen by Cardiology in 02/2019 during an admission for acute bilateral PE and acute DVT of left lower extremity. Echo showed LVEF of 60-65% and moderately enlarged RV with mildly reduced systolic function. High-sensitivity troponin peaked at 352 but this was felt to be due to large PE. Last Echo in 04/2021 showed LVEF of 55-60% with no regional wall motion abnormalities and mild LVH, mildly enlarged RV with normal systolic function, severe biatrial enlargement, mild to moderate TR, and borderline dilatation of the ascending aorta measuring 39 mm.    She was not seen by Cardiology again until she was admitted in 09/2021 after presenting with confusion and being found to have sinus bradycardia with 2nd degree type 1 AV block (Wenckebach) and rates in the 40s. Home Coreg was stopped with improvement in rates but she continue to have intermittent Wenckebach. However, she was asymptomatic with this. Live Zio monitor was placed prior to discharge. She then presented back to the ED on 10/11/2021 for further evaluation of dull ache on the left side of her chest for the last 3 days as well as waves of nausea which she reported she has had for  about 1 year. EKG showed no acute ischemic changes. High-sensitivity troponin negative x2. Patient was given a GI cocktail and symptoms completely resolved. She was noted to have continued Wenckebach on telemetry in the ED. She was ultimately felt to be stable for discharge with close Cardiology follow-up. However, after patient was discharged from the ED, we received a Zio report that patient had a short episodes of complete heart block. Patient was referred to EP and she ultimately underwent placement of dual chamber Biotronik pacemaker in 10/2021 with Dr. Quentin Ore  Patient was last seen by Dr. Quentin Ore in 01/2022 at which time she was doing well with no cardiac symptoms. Since last visit, patient was seen in the ED on 02/22/2022 after a fall at her assisted living facility.   Patient was reported walking in her kitchen with her walker and fell to the ground striking her head. Patient stated she tripped but could not remember what she tripped on. Patient was noted to have a hematoma on her forehead but head/neck CT was unremarkable and she was discharged.   Patient presents today for follow-up. Here with daughter [name]. Patient is doing very well from a cardiac standpoint. She denies any chest pain or shortness of breath. No orthopnea or PND. She has no lower extremity edema on current regimen of Lasix and compression stockings. No palpitations, lightheadedness, dizziness, or syncope. We discussed most recent fall. Patient cannot remember what caused her to fall but denies having any symptoms beforehand. Hematoma on left side of face/forehead has almost healed.  Daughter states she was seen by Neurology following this and they diagnosed her with a concussion. She has had some worsening memory issues since then. However, both patient and daughter states fall was an isolated episode. No other recent falls. She ambulates with a walker at home and typically does well with this.  Past Medical History:  Diagnosis Date    Allergy    codeine, thiazides   Anxiety    new dx   Arrhythmia    Arthritis    Atrioventricular block, Mobitz type 1, Wenckebach    Breast cancer ([place]) 03/14/2012   bx=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,ER/PR=+,upper inner quad   Cancer (H[facility])    breast   Cataract    DVT (deep venous thrombosis) ([place]) 02/2019   left leg   Dyspnea    Edema    both legs feet and toe, abdomen   Glaucoma    laser treated years ago   HOH (hard of hearing)    Hyperlipemia    Hypertension    Hypothyroidism    Pancreatic cyst    benign   PONV (postoperative nausea and vomiting)    Pulmonary embolism ([place]) 02/2019   bilateral    Radiation 06/11/2012-07/12/2012   17 sessions 4250 cGy, 3 sessions 750 cGy   Vertigo    Wears glasses    Wears partial dentures    partial upper    Past Surgical History:  Procedure Laterality Date   ABDOMINAL HYSTERECTOMY  1966   1/2 ovary left in    [place]   lumpectomy-lt   CATARACT EXTRACTION     b/l   COLONOSCOPY     EXCISION MASS NECK Left 03/17/2019    EXCISION MASS NECK (Left Neck)   EXCISION MASS NECK Left 03/17/2019   Procedure: EXCISION MASS NECK;  Surgeon: Helayne Seminole, MD;  Location: [facility];  Service: ENT;  Laterality: Left;   EYE SURGERY Bilateral    bilateral cataract removal   IR IMAGING GUIDED PORT INSERTION  04/23/2019   JOINT REPLACEMENT  2013   rt total knee   JOINT REPLACEMENT  1995   lt total knee   PACEMAKER IMPLANT N/A 11/01/2021   Procedure: PACEMAKER IMPLANT;  Surgeon: Vickie Epley, MD;  Location: [facility];  Service: Cardiovascular;  Laterality: N/A;   PARTIAL MASTECTOMY WITH NEEDLE LOCALIZATION  04/09/2012   Procedure: PARTIAL MASTECTOMY WITH NEEDLE LOCALIZATION;  Surgeon: Adin Hector, MD;  Location: [facility];  Service: General;  Laterality: Right;   SKIN BIOPSY Left 03/17/2019   LEFT THIGH   SKIN BIOPSY Left 03/17/2019   Procedure: Skin Biopsy Left  Thigh;  Surgeon: Helayne Seminole, MD;  Location: [facility];  Service: ENT;  Laterality: Left;   TONSILLECTOMY     TOTAL KNEE ARTHROPLASTY  08/16/2011   Procedure: TOTAL KNEE ARTHROPLASTY;  Surgeon: Ninetta Lights, MD;  Location: [facility];  Service: Orthopedics;  Laterality: Right;    Current Medications: Current Meds  Medication Sig   acetaminophen (TYLENOL) 500 MG tablet Take 1,000 mg by mouth every 6 (six) hours as needed for moderate pain.   amLODipine (NORVASC) 2.5 MG tablet Take 2.5 mg by mouth daily.   apixaban (ELIQUIS) 2.5 MG TABS tablet Take 1 tablet (2.5 mg total) by mouth 2 (two) times daily. Resume Sunday with the evening dose.   atorvastatin (LIPITOR) 20 MG tablet Take 1 tablet (20 mg total) by mouth daily. (Patient taking differently: Take 20 mg  by mouth every evening.)   carvedilol (COREG) 6.25 MG tablet Take 1 tablet (6.25 mg total) by mouth 2 (two) times daily with a meal.   citalopram (CELEXA) 10 MG tablet Take 10 mg by mouth daily.   furosemide (LASIX) 20 MG tablet Take 20-40 mg by mouth See admin instructions. 40 mg on Monday,Wednesday and Friday 20 mg on Tuesday,Thursday,Saturday and sunday   levothyroxine (SYNTHROID) 25 MCG tablet Take 25 mcg by mouth daily before breakfast.    lidocaine-prilocaine (EMLA) cream Apply to affected area once (Patient taking differently: Apply 1 application  topically See admin instructions. Apply to port site prior to port access)   omeprazole (PRILOSEC) 20 MG capsule Take 1 capsule (20 mg total) by mouth daily.   Polyethyl Glycol-Propyl Glycol (SYSTANE) 0.4-0.3 % GEL ophthalmic gel Place 1 application into both eyes daily as needed (dry eyes).   sodium chloride (MURO 128) 5 % ophthalmic ointment Place 1 application  into both eyes at bedtime as needed for eye irritation.   zolpidem (AMBIEN) 5 MG tablet Take 5 mg by mouth at bedtime.     Allergies:   Alendronate sodium, Amoxicillin-pot clavulanate, Codeine, Lisinopril, Septra  [sulfamethoxazole-trimethoprim], Thiazide-type diuretics, and Latex   Social History   Socioeconomic History   Marital status: Widowed    Spouse name: Not on file   Number of children: 2   Years of education: Not on file   Highest education level: Not on file  Occupational History   Occupation: Retired    Comment: Community education officer   Tobacco Use   Smoking status: Never   Smokeless tobacco: Never  Vaping Use   Vaping Use: Never used  Substance and Sexual Activity   Alcohol use: No   Drug use: No   Sexual activity: Not Currently  Other Topics Concern   Not on file  Social History Narrative   Not on file   Social Determinants of Health   Financial Resource Strain: Not on file  Food Insecurity: Not on file  Transportation Needs: Not on file  Physical Activity: Not on file  Stress: Not on file  Social Connections: Not on file     Family History: The patient's family history includes Cancer in her maternal aunt; Heart disease in her father.  ROS:   Please see the history of present illness.     EKGs/Labs/Other Studies Reviewed:    The following studies were reviewed:  Echocardiogram 05/16/2021: Impressions:  1. Left ventricular ejection fraction, by estimation, is 55 to 60%. The  left ventricle has normal function. The left ventricle has no regional  wall motion abnormalities. There is mild left ventricular hypertrophy.  Left ventricular diastolic parameters  are indeterminate.   2. Right ventricular systolic function is normal. The right ventricular  size is mildly enlarged. There is normal pulmonary artery systolic  pressure.   3. Left atrial size was severely dilated.   4. Right atrial size was severely dilated.   5. The mitral valve is normal in structure. Trivial mitral valve  regurgitation. No evidence of mitral stenosis.   6. Tricuspid valve regurgitation is mild to moderate.   7. The aortic valve is normal in structure. Aortic valve regurgitation is   trivial. No aortic stenosis is present.   8. Aortic dilatation noted. There is borderline dilatation of the  ascending aorta, measuring 39 mm.   9. The inferior vena cava is dilated in size with >50% respiratory  variability, suggesting right atrial pressure of 8 mmHg.  _______________  2 Week Zio Monitor 09/2021: Results pending.  EKG:  EKG not ordered today.   Recent Labs: 11/01/2021: TSH 1.225 11/04/2021: Magnesium 2.1 02/22/2022: ALT 15; BUN 20; Creatinine, Ser 0.75; Hemoglobin 14.0; Platelets 131; Potassium 3.7; Sodium 143  Recent Lipid Panel    Component Value Date/Time   CHOL 189 05/16/2021 0805   TRIG 75 05/16/2021 0805   HDL 76 05/16/2021 0805   CHOLHDL 2.5 05/16/2021 0805   VLDL 15 05/16/2021 0805   LDLCALC 98 05/16/2021 0805    Physical Exam:    Vital Signs: BP (!) 118/58 (BP Location: Left Arm, Patient Position: Sitting)   Pulse 68   Wt 109 lb 9.6 oz (49.7 kg)   SpO2 95%   BMI 18.81 kg/m     Wt Readings from Last 3 Encounters:  03/15/22 109 lb 9.6 oz (49.7 kg)  02/22/22 110 lb (49.9 kg)  02/15/22 110 lb (49.9 kg)     General: 88 y.o. thin Caucasian female in no acute distress. HEENT: Normocephalic and atraumatic. Sclera clear.  Neck: Supple.  No JVD. Heart: RRR. Distinct S1 and S2. Very soft I-II/VI systolic murmur noted. No gallops or rubs.  Radial pulses 2+ and equal bilaterally. Lungs: No increased work of breathing. Clear to ausculation bilaterally. No wheezes, rhonchi, or rales.  Abdomen: Soft, non-distended, and non-tender to palpation.  Extremities: No lower extremity edema.    Skin: Warm and dry. Neuro: Alert and oriented x3. No focal deficits. Psych: Normal affect. Responds appropriately.  Assessment:    1. Heart block AV complete ([place])   2. S/P placement of cardiac pacemaker   3. Hypertension, unspecified type   4. Hyperlipidemia, unspecified hyperlipidemia type   5. Chronic lower extremity edema   6. History of pulmonary embolus (PE)    7. History of DVT (deep vein thrombosis)   8. History of recent fall     Plan:    Complete Heart Block s/p PPM S/p dual chamber Biotronik PPM in 10/2021. - Followed by Dr. Quentin Ore   History of Atypical Chest Pain History of atypical chest pain that improved with GI cocktail and Omeprazole. - No recurrence. Denies any chest pain. - No additional cardiac work-up needed.   Hypertension BP well controlled. - Continue Amlodipine 2.5mg  daily and Coreg 6.25mg  twice daily.    Hyperlipidemia LDL 98 in 04/2021. LDL goal <70 given history of stroke. - Continue Lipitor 20mg  daily.  - She is scheduled to have annual visit with PCP in 04/2022 and will likely have labs drawn at that time. As long as LDL is stable, I think we can continue current dose. LDL is not quite at goal but given advanced age I don't think we necessarily be overly aggressive with this. Discussed this with daughter who agreed that "we should not rock the boat."  Chronic Lower Extremity Edema Well controlled with Lasix and compression stockings.  - Continue current Lasix regimen (40mg  M/W/F and 20mg  T/Th/Sat/Sun).   History of PE/DVT Diagnosed in 02/2019. - Remains on Eliquis 2.5mg  twice daily.  Recent Fall Patient fell on 02/22/2022 and hit her face sustaining a large hematoma on left forehead and a concussion. She was seen in the ED for this and head CT showed no acute findings. While in the ED, patient stated that she tripped on something but could not remember what she tripped on. Today, patient states she does not remember dripping on anything. She denies any symptoms prior to fall. This was an isolated event. Both patient and daughter  deny any other falls. It sounds like this was likely a mechanical fall; however, I will reach out to device team to see if we can do a remote interrogation of her device from morning of 02/22/2022 to make sure she did not have any concerning arrhythmias on that day leading to fall. If she has  recurrent serious falls, may need to discuss risks of continuing anticoagulation.    Disposition: Follow up in 6 months.  ADDENDUM 03/16/2022: Spoke with device clinic who stated: "Biotronik updates nightly. I am unable to look at a specific day, but her device is working normally and she has not had any "events". She paces 100% in the RV." This is what I was expecting but wanted to make sure. I did call and notify patient's daughter [name] of this per her request at visit yesterday. Continue with plan above.   Medication Adjustments/Labs and Tests Ordered: Current medicines are reviewed at length with the patient today.  Concerns regarding medicines are outlined above.  No orders of the defined types were placed in this encounter.  No orders of the defined types were placed in this encounter.   Patient Instructions  Medication Instructions:  No Changes In Medications at this time. *If you need a refill on your cardiac medications before your next appointment, please call your pharmacy*  Lab Work: None Ordered At This Time.  If you have labs (blood work) drawn today and your tests are completely normal, you will receive your results only by: [place] (if you have MyChart) OR A paper copy in the mail If you have any lab test that is abnormal or we need to change your treatment, we will call you to review the results.  Testing/Procedures: None Ordered At This Time.   Follow-Up: At [facility], you and your health needs are our priority.  As part of our continuing mission to provide you with exceptional heart care, we have created designated Provider Care Teams.  These Care Teams include your primary Cardiologist (physician) and Advanced Practice Providers (APPs -  Physician Assistants and Nurse Practitioners) who all work together to provide you with the care you need, when you need it.  Your next appointment:   6 month(s)  The format for your next appointment:   In  Person  Provider:   Sande Rives PA-C         Signed, Darreld Mclean, PA-C  03/15/2022 1:23 PM    [place]

## 2022-03-15 ENCOUNTER — Encounter: Payer: Self-pay | Admitting: Student

## 2022-03-15 ENCOUNTER — Ambulatory Visit: Payer: Medicare Other | Attending: Student | Admitting: Student

## 2022-03-15 VITALS — BP 118/58 | HR 68 | Wt 109.6 lb

## 2022-03-15 DIAGNOSIS — E785 Hyperlipidemia, unspecified: Secondary | ICD-10-CM | POA: Insufficient documentation

## 2022-03-15 DIAGNOSIS — I442 Atrioventricular block, complete: Secondary | ICD-10-CM | POA: Diagnosis not present

## 2022-03-15 DIAGNOSIS — I10 Essential (primary) hypertension: Secondary | ICD-10-CM | POA: Insufficient documentation

## 2022-03-15 DIAGNOSIS — Z86718 Personal history of other venous thrombosis and embolism: Secondary | ICD-10-CM | POA: Insufficient documentation

## 2022-03-15 DIAGNOSIS — Z86711 Personal history of pulmonary embolism: Secondary | ICD-10-CM | POA: Diagnosis not present

## 2022-03-15 DIAGNOSIS — R600 Localized edema: Secondary | ICD-10-CM | POA: Insufficient documentation

## 2022-03-15 DIAGNOSIS — Z950 Presence of cardiac pacemaker: Secondary | ICD-10-CM | POA: Insufficient documentation

## 2022-03-15 DIAGNOSIS — Z9181 History of falling: Secondary | ICD-10-CM | POA: Insufficient documentation

## 2022-03-15 NOTE — Patient Instructions (Signed)
Medication Instructions:  No Changes In Medications at this time. *If you need a refill on your cardiac medications before your next appointment, please call your pharmacy*  Lab Work: None Ordered At This Time.  If you have labs (blood work) drawn today and your tests are completely normal, you will receive your results only by: [facility] (if you have MyChart) OR A paper copy in the mail If you have any lab test that is abnormal or we need to change your treatment, we will call you to review the results.  Testing/Procedures: None Ordered At This Time.   Follow-Up: At [facility], you and your health needs are our priority.  As part of our continuing mission to provide you with exceptional heart care, we have created designated Provider Care Teams.  These Care Teams include your primary Cardiologist (physician) and Advanced Practice Providers (APPs -  Physician Assistants and Nurse Practitioners) who all work together to provide you with the care you need, when you need it.  Your next appointment:   6 month(s)  The format for your next appointment:   In Person  Provider:   Sande Rives PA-C

## 2022-03-23 DIAGNOSIS — Z23 Encounter for immunization: Secondary | ICD-10-CM | POA: Diagnosis not present

## 2022-04-04 ENCOUNTER — Other Ambulatory Visit: Payer: Self-pay | Admitting: Gastroenterology

## 2022-04-04 DIAGNOSIS — R110 Nausea: Secondary | ICD-10-CM | POA: Diagnosis not present

## 2022-04-04 DIAGNOSIS — K921 Melena: Secondary | ICD-10-CM

## 2022-04-05 ENCOUNTER — Encounter: Payer: Self-pay | Admitting: Podiatry

## 2022-04-05 ENCOUNTER — Ambulatory Visit (INDEPENDENT_AMBULATORY_CARE_PROVIDER_SITE_OTHER): Payer: Medicare Other | Admitting: Podiatry

## 2022-04-05 DIAGNOSIS — D689 Coagulation defect, unspecified: Secondary | ICD-10-CM | POA: Diagnosis not present

## 2022-04-05 DIAGNOSIS — M79674 Pain in right toe(s): Secondary | ICD-10-CM | POA: Diagnosis not present

## 2022-04-05 DIAGNOSIS — B351 Tinea unguium: Secondary | ICD-10-CM | POA: Diagnosis not present

## 2022-04-05 DIAGNOSIS — G629 Polyneuropathy, unspecified: Secondary | ICD-10-CM | POA: Diagnosis not present

## 2022-04-05 DIAGNOSIS — M79675 Pain in left toe(s): Secondary | ICD-10-CM | POA: Diagnosis not present

## 2022-04-05 NOTE — Progress Notes (Signed)
This patient returns to my office for at risk foot care.  This patient requires this care by a professional since this patient will be at risk due to having neuropathy  and coagulation defect.  Patient is taking eliquis.This patient is unable to cut nails herself since the patient cannot reach her nails.These nails are painful walking and wearing shoes.  She has painful corn on the tip of her third toe left foot. She presents to the office with her daughter.This patient presents for at risk foot care today.  General Appearance  Alert, conversant and in no acute stress.  Vascular  Dorsalis pedis and posterior tibial  pulses are  weakly palpable  bilaterally.  Capillary return is within normal limits  bilaterally. Temperature is within normal limits  bilaterally.Digital hair  noted.    Neurologic  Senn-Weinstein monofilament wire test within normal limits  bilaterally. Muscle power within normal limits bilaterally.  Nails Thick disfigured discolored nails with subungual debris  from hallux to fifth toes bilaterally. No evidence of bacterial infection or drainage bilaterally.  Orthopedic  No limitations of motion  feet .  No crepitus or effusions noted.  Hammer toes  B/L.  Skin  normotropic skin with no porokeratosis noted bilaterally.  No signs of infections or ulcers noted.   Distal clavi 3rd toe left foot.     Onychomycosis  Pain in right toes  Pain in left toes  Consent was obtained for treatment procedures.   Mechanical debridement of nails 1-5  bilaterally performed with a nail nipper.  Filed with dremel without incident. Debridement of clavi with # 15 blade.  Padding dispensed. Discussed flexor tenotomy with patient and daughter.   Return office visit   10  weeks                  Told patient to return for periodic foot care and evaluation due to potential at risk complications.   Gardiner Barefoot DPM

## 2022-04-23 ENCOUNTER — Emergency Department (HOSPITAL_COMMUNITY): Payer: Medicare Other

## 2022-04-23 ENCOUNTER — Inpatient Hospital Stay (HOSPITAL_COMMUNITY): Payer: Medicare Other

## 2022-04-23 ENCOUNTER — Encounter (HOSPITAL_COMMUNITY)
Admission: EM | Disposition: A | Discharge status: Skilled Nursing Facility | Payer: Self-pay | Source: Skilled Nursing Facility

## 2022-04-23 ENCOUNTER — Emergency Department (HOSPITAL_COMMUNITY): Payer: Medicare Other | Admitting: Certified Registered Nurse Anesthetist

## 2022-04-23 ENCOUNTER — Inpatient Hospital Stay (HOSPITAL_COMMUNITY)
Admission: EM | Admit: 2022-04-23 | Discharge: 2022-05-06 | DRG: 799 | Disposition: A | Discharge status: Skilled Nursing Facility | Payer: Medicare Other | Source: Skilled Nursing Facility | Attending: Surgery | Admitting: Surgery

## 2022-04-23 DIAGNOSIS — M79602 Pain in left arm: Secondary | ICD-10-CM | POA: Diagnosis not present

## 2022-04-23 DIAGNOSIS — K567 Ileus, unspecified: Secondary | ICD-10-CM | POA: Diagnosis not present

## 2022-04-23 DIAGNOSIS — K219 Gastro-esophageal reflux disease without esophagitis: Secondary | ICD-10-CM | POA: Diagnosis present

## 2022-04-23 DIAGNOSIS — Z9889 Other specified postprocedural states: Secondary | ICD-10-CM

## 2022-04-23 DIAGNOSIS — F32A Depression, unspecified: Secondary | ICD-10-CM | POA: Diagnosis not present

## 2022-04-23 DIAGNOSIS — I959 Hypotension, unspecified: Secondary | ICD-10-CM | POA: Diagnosis not present

## 2022-04-23 DIAGNOSIS — D8481 Immunodeficiency due to conditions classified elsewhere: Secondary | ICD-10-CM | POA: Diagnosis not present

## 2022-04-23 DIAGNOSIS — Z7401 Bed confinement status: Secondary | ICD-10-CM | POA: Diagnosis not present

## 2022-04-23 DIAGNOSIS — R531 Weakness: Secondary | ICD-10-CM | POA: Diagnosis not present

## 2022-04-23 DIAGNOSIS — F0390 Unspecified dementia without behavioral disturbance: Secondary | ICD-10-CM | POA: Diagnosis not present

## 2022-04-23 DIAGNOSIS — S0101XA Laceration without foreign body of scalp, initial encounter: Secondary | ICD-10-CM | POA: Diagnosis present

## 2022-04-23 DIAGNOSIS — T82594A Other mechanical complication of infusion catheter, initial encounter: Secondary | ICD-10-CM | POA: Diagnosis not present

## 2022-04-23 DIAGNOSIS — Z23 Encounter for immunization: Secondary | ICD-10-CM

## 2022-04-23 DIAGNOSIS — E039 Hypothyroidism, unspecified: Secondary | ICD-10-CM | POA: Diagnosis not present

## 2022-04-23 DIAGNOSIS — Z8572 Personal history of non-Hodgkin lymphomas: Secondary | ICD-10-CM

## 2022-04-23 DIAGNOSIS — F418 Other specified anxiety disorders: Secondary | ICD-10-CM

## 2022-04-23 DIAGNOSIS — W19XXXA Unspecified fall, initial encounter: Principal | ICD-10-CM

## 2022-04-23 DIAGNOSIS — I441 Atrioventricular block, second degree: Secondary | ICD-10-CM | POA: Diagnosis present

## 2022-04-23 DIAGNOSIS — N390 Urinary tract infection, site not specified: Secondary | ICD-10-CM | POA: Diagnosis not present

## 2022-04-23 DIAGNOSIS — R739 Hyperglycemia, unspecified: Secondary | ICD-10-CM | POA: Diagnosis present

## 2022-04-23 DIAGNOSIS — M1990 Unspecified osteoarthritis, unspecified site: Secondary | ICD-10-CM | POA: Diagnosis not present

## 2022-04-23 DIAGNOSIS — E785 Hyperlipidemia, unspecified: Secondary | ICD-10-CM | POA: Diagnosis not present

## 2022-04-23 DIAGNOSIS — J90 Pleural effusion, not elsewhere classified: Secondary | ICD-10-CM | POA: Diagnosis not present

## 2022-04-23 DIAGNOSIS — Z8673 Personal history of transient ischemic attack (TIA), and cerebral infarction without residual deficits: Secondary | ICD-10-CM

## 2022-04-23 DIAGNOSIS — S3609XA Other injury of spleen, initial encounter: Secondary | ICD-10-CM | POA: Diagnosis not present

## 2022-04-23 DIAGNOSIS — I10 Essential (primary) hypertension: Secondary | ICD-10-CM | POA: Diagnosis present

## 2022-04-23 DIAGNOSIS — I82622 Acute embolism and thrombosis of deep veins of left upper extremity: Secondary | ICD-10-CM | POA: Diagnosis present

## 2022-04-23 DIAGNOSIS — E43 Unspecified severe protein-calorie malnutrition: Secondary | ICD-10-CM | POA: Insufficient documentation

## 2022-04-23 DIAGNOSIS — Z79890 Hormone replacement therapy (postmenopausal): Secondary | ICD-10-CM

## 2022-04-23 DIAGNOSIS — Z86711 Personal history of pulmonary embolism: Secondary | ICD-10-CM

## 2022-04-23 DIAGNOSIS — R4182 Altered mental status, unspecified: Secondary | ICD-10-CM | POA: Diagnosis not present

## 2022-04-23 DIAGNOSIS — Z043 Encounter for examination and observation following other accident: Secondary | ICD-10-CM | POA: Diagnosis not present

## 2022-04-23 DIAGNOSIS — G4700 Insomnia, unspecified: Secondary | ICD-10-CM | POA: Diagnosis not present

## 2022-04-23 DIAGNOSIS — S3600XA Unspecified injury of spleen, initial encounter: Secondary | ICD-10-CM | POA: Diagnosis not present

## 2022-04-23 DIAGNOSIS — K6389 Other specified diseases of intestine: Secondary | ICD-10-CM | POA: Diagnosis not present

## 2022-04-23 DIAGNOSIS — W1839XA Other fall on same level, initial encounter: Secondary | ICD-10-CM | POA: Diagnosis present

## 2022-04-23 DIAGNOSIS — R0902 Hypoxemia: Secondary | ICD-10-CM | POA: Diagnosis not present

## 2022-04-23 DIAGNOSIS — I82C12 Acute embolism and thrombosis of left internal jugular vein: Secondary | ICD-10-CM | POA: Diagnosis not present

## 2022-04-23 DIAGNOSIS — K661 Hemoperitoneum: Secondary | ICD-10-CM | POA: Diagnosis present

## 2022-04-23 DIAGNOSIS — Z91040 Latex allergy status: Secondary | ICD-10-CM

## 2022-04-23 DIAGNOSIS — I82612 Acute embolism and thrombosis of superficial veins of left upper extremity: Secondary | ICD-10-CM | POA: Diagnosis present

## 2022-04-23 DIAGNOSIS — Z7901 Long term (current) use of anticoagulants: Secondary | ICD-10-CM | POA: Diagnosis not present

## 2022-04-23 DIAGNOSIS — T1490XA Injury, unspecified, initial encounter: Secondary | ICD-10-CM | POA: Diagnosis not present

## 2022-04-23 DIAGNOSIS — Z950 Presence of cardiac pacemaker: Secondary | ICD-10-CM | POA: Diagnosis not present

## 2022-04-23 DIAGNOSIS — Z4682 Encounter for fitting and adjustment of non-vascular catheter: Secondary | ICD-10-CM | POA: Diagnosis not present

## 2022-04-23 DIAGNOSIS — S199XXA Unspecified injury of neck, initial encounter: Secondary | ICD-10-CM | POA: Diagnosis not present

## 2022-04-23 DIAGNOSIS — S60042A Contusion of left ring finger without damage to nail, initial encounter: Secondary | ICD-10-CM | POA: Diagnosis present

## 2022-04-23 DIAGNOSIS — Z681 Body mass index (BMI) 19 or less, adult: Secondary | ICD-10-CM | POA: Diagnosis not present

## 2022-04-23 DIAGNOSIS — I82A12 Acute embolism and thrombosis of left axillary vein: Secondary | ICD-10-CM | POA: Diagnosis present

## 2022-04-23 DIAGNOSIS — Z853 Personal history of malignant neoplasm of breast: Secondary | ICD-10-CM | POA: Diagnosis not present

## 2022-04-23 DIAGNOSIS — R140 Abdominal distension (gaseous): Secondary | ICD-10-CM | POA: Diagnosis not present

## 2022-04-23 DIAGNOSIS — S0101XD Laceration without foreign body of scalp, subsequent encounter: Secondary | ICD-10-CM | POA: Diagnosis not present

## 2022-04-23 DIAGNOSIS — S36030A Superficial (capsular) laceration of spleen, initial encounter: Secondary | ICD-10-CM | POA: Diagnosis not present

## 2022-04-23 DIAGNOSIS — I4430 Unspecified atrioventricular block: Secondary | ICD-10-CM | POA: Diagnosis not present

## 2022-04-23 DIAGNOSIS — I82B12 Acute embolism and thrombosis of left subclavian vein: Secondary | ICD-10-CM | POA: Diagnosis present

## 2022-04-23 DIAGNOSIS — Z888 Allergy status to other drugs, medicaments and biological substances status: Secondary | ICD-10-CM

## 2022-04-23 DIAGNOSIS — I700 Atherosclerosis of aorta: Secondary | ICD-10-CM | POA: Diagnosis not present

## 2022-04-23 DIAGNOSIS — D696 Thrombocytopenia, unspecified: Secondary | ICD-10-CM | POA: Diagnosis not present

## 2022-04-23 DIAGNOSIS — R296 Repeated falls: Secondary | ICD-10-CM | POA: Diagnosis present

## 2022-04-23 DIAGNOSIS — S36032A Major laceration of spleen, initial encounter: Principal | ICD-10-CM | POA: Diagnosis present

## 2022-04-23 DIAGNOSIS — R2689 Other abnormalities of gait and mobility: Secondary | ICD-10-CM | POA: Diagnosis not present

## 2022-04-23 DIAGNOSIS — S3609XD Other injury of spleen, subsequent encounter: Secondary | ICD-10-CM | POA: Diagnosis not present

## 2022-04-23 DIAGNOSIS — Z86718 Personal history of other venous thrombosis and embolism: Secondary | ICD-10-CM

## 2022-04-23 DIAGNOSIS — R279 Unspecified lack of coordination: Secondary | ICD-10-CM | POA: Diagnosis not present

## 2022-04-23 DIAGNOSIS — Z4659 Encounter for fitting and adjustment of other gastrointestinal appliance and device: Secondary | ICD-10-CM | POA: Diagnosis not present

## 2022-04-23 DIAGNOSIS — Z923 Personal history of irradiation: Secondary | ICD-10-CM

## 2022-04-23 DIAGNOSIS — I442 Atrioventricular block, complete: Secondary | ICD-10-CM | POA: Diagnosis not present

## 2022-04-23 DIAGNOSIS — S60052A Contusion of left little finger without damage to nail, initial encounter: Secondary | ICD-10-CM | POA: Diagnosis present

## 2022-04-23 DIAGNOSIS — Y92098 Other place in other non-institutional residence as the place of occurrence of the external cause: Secondary | ICD-10-CM

## 2022-04-23 DIAGNOSIS — Z452 Encounter for adjustment and management of vascular access device: Secondary | ICD-10-CM | POA: Diagnosis not present

## 2022-04-23 DIAGNOSIS — M7989 Other specified soft tissue disorders: Secondary | ICD-10-CM | POA: Diagnosis not present

## 2022-04-23 DIAGNOSIS — S0990XA Unspecified injury of head, initial encounter: Secondary | ICD-10-CM | POA: Diagnosis not present

## 2022-04-23 DIAGNOSIS — R1311 Dysphagia, oral phase: Secondary | ICD-10-CM | POA: Diagnosis not present

## 2022-04-23 DIAGNOSIS — M6259 Muscle wasting and atrophy, not elsewhere classified, multiple sites: Secondary | ICD-10-CM | POA: Diagnosis not present

## 2022-04-23 DIAGNOSIS — B952 Enterococcus as the cause of diseases classified elsewhere: Secondary | ICD-10-CM | POA: Diagnosis not present

## 2022-04-23 DIAGNOSIS — Z880 Allergy status to penicillin: Secondary | ICD-10-CM

## 2022-04-23 DIAGNOSIS — T83198A Other mechanical complication of other urinary devices and implants, initial encounter: Secondary | ICD-10-CM | POA: Diagnosis not present

## 2022-04-23 DIAGNOSIS — D649 Anemia, unspecified: Secondary | ICD-10-CM | POA: Diagnosis not present

## 2022-04-23 DIAGNOSIS — M25512 Pain in left shoulder: Secondary | ICD-10-CM | POA: Diagnosis not present

## 2022-04-23 DIAGNOSIS — Z741 Need for assistance with personal care: Secondary | ICD-10-CM | POA: Diagnosis not present

## 2022-04-23 DIAGNOSIS — M50322 Other cervical disc degeneration at C5-C6 level: Secondary | ICD-10-CM | POA: Diagnosis not present

## 2022-04-23 DIAGNOSIS — Z885 Allergy status to narcotic agent status: Secondary | ICD-10-CM

## 2022-04-23 DIAGNOSIS — D62 Acute posthemorrhagic anemia: Secondary | ICD-10-CM | POA: Diagnosis present

## 2022-04-23 DIAGNOSIS — S3993XA Unspecified injury of pelvis, initial encounter: Secondary | ICD-10-CM | POA: Diagnosis not present

## 2022-04-23 DIAGNOSIS — F05 Delirium due to known physiological condition: Secondary | ICD-10-CM | POA: Diagnosis not present

## 2022-04-23 DIAGNOSIS — Z90710 Acquired absence of both cervix and uterus: Secondary | ICD-10-CM

## 2022-04-23 DIAGNOSIS — M810 Age-related osteoporosis without current pathological fracture: Secondary | ICD-10-CM | POA: Diagnosis not present

## 2022-04-23 DIAGNOSIS — R58 Hemorrhage, not elsewhere classified: Secondary | ICD-10-CM | POA: Diagnosis not present

## 2022-04-23 DIAGNOSIS — Z79899 Other long term (current) drug therapy: Secondary | ICD-10-CM

## 2022-04-23 DIAGNOSIS — M50323 Other cervical disc degeneration at C6-C7 level: Secondary | ICD-10-CM | POA: Diagnosis not present

## 2022-04-23 DIAGNOSIS — R41841 Cognitive communication deficit: Secondary | ICD-10-CM | POA: Diagnosis not present

## 2022-04-23 HISTORY — PX: SCALP LACERATION REPAIR: SHX6089

## 2022-04-23 HISTORY — PX: LAPAROTOMY: SHX154

## 2022-04-23 LAB — CBC
HCT: 35.8 % — ABNORMAL LOW (ref 36.0–46.0)
Hemoglobin: 11.6 g/dL — ABNORMAL LOW (ref 12.0–15.0)
MCH: 30.1 pg (ref 26.0–34.0)
MCHC: 32.4 g/dL (ref 30.0–36.0)
MCV: 92.7 fL (ref 80.0–100.0)
Platelets: 107 10*3/uL — ABNORMAL LOW (ref 150–400)
RBC: 3.86 MIL/uL — ABNORMAL LOW (ref 3.87–5.11)
RDW: 13.2 % (ref 11.5–15.5)
WBC: 5.5 10*3/uL (ref 4.0–10.5)
nRBC: 0 % (ref 0.0–0.2)

## 2022-04-23 LAB — URINALYSIS, ROUTINE W REFLEX MICROSCOPIC
Bacteria, UA: NONE SEEN
Bilirubin Urine: NEGATIVE
Glucose, UA: 50 mg/dL — AB
Ketones, ur: 5 mg/dL — AB
Leukocytes,Ua: NEGATIVE
Nitrite: NEGATIVE
Protein, ur: 100 mg/dL — AB
Specific Gravity, Urine: 1.03 (ref 1.005–1.030)
pH: 6 (ref 5.0–8.0)

## 2022-04-23 LAB — I-STAT CHEM 8, ED
BUN: 36 mg/dL — ABNORMAL HIGH (ref 8–23)
Calcium, Ion: 1.13 mmol/L — ABNORMAL LOW (ref 1.15–1.40)
Chloride: 103 mmol/L (ref 98–111)
Creatinine, Ser: 1 mg/dL (ref 0.44–1.00)
Glucose, Bld: 159 mg/dL — ABNORMAL HIGH (ref 70–99)
HCT: 32 % — ABNORMAL LOW (ref 36.0–46.0)
Hemoglobin: 10.9 g/dL — ABNORMAL LOW (ref 12.0–15.0)
Potassium: 4.2 mmol/L (ref 3.5–5.1)
Sodium: 140 mmol/L (ref 135–145)
TCO2: 28 mmol/L (ref 22–32)

## 2022-04-23 LAB — APTT: aPTT: 26 seconds (ref 24–36)

## 2022-04-23 LAB — POCT I-STAT 7, (LYTES, BLD GAS, ICA,H+H)
Acid-Base Excess: 0 mmol/L (ref 0.0–2.0)
Bicarbonate: 25.2 mmol/L (ref 20.0–28.0)
Calcium, Ion: 1.21 mmol/L (ref 1.15–1.40)
HCT: 25 % — ABNORMAL LOW (ref 36.0–46.0)
Hemoglobin: 8.5 g/dL — ABNORMAL LOW (ref 12.0–15.0)
O2 Saturation: 100 %
Patient temperature: 36.2
Potassium: 4 mmol/L (ref 3.5–5.1)
Sodium: 138 mmol/L (ref 135–145)
TCO2: 26 mmol/L (ref 22–32)
pCO2 arterial: 39.6 mmHg (ref 32–48)
pH, Arterial: 7.408 (ref 7.35–7.45)
pO2, Arterial: 449 mmHg — ABNORMAL HIGH (ref 83–108)

## 2022-04-23 LAB — COMPREHENSIVE METABOLIC PANEL
ALT: 16 U/L (ref 0–44)
AST: 26 U/L (ref 15–41)
Albumin: 3.3 g/dL — ABNORMAL LOW (ref 3.5–5.0)
Alkaline Phosphatase: 84 U/L (ref 38–126)
Anion gap: 10 (ref 5–15)
BUN: 26 mg/dL — ABNORMAL HIGH (ref 8–23)
CO2: 26 mmol/L (ref 22–32)
Calcium: 9.3 mg/dL (ref 8.9–10.3)
Chloride: 105 mmol/L (ref 98–111)
Creatinine, Ser: 0.91 mg/dL (ref 0.44–1.00)
GFR, Estimated: >60 mL/min (ref >60)
Glucose, Bld: 160 mg/dL — ABNORMAL HIGH (ref 70–99)
Potassium: 3.6 mmol/L (ref 3.5–5.1)
Sodium: 141 mmol/L (ref 135–145)
Total Bilirubin: 1 mg/dL (ref 0.3–1.2)
Total Protein: 5.5 g/dL — ABNORMAL LOW (ref 6.5–8.1)

## 2022-04-23 LAB — LACTIC ACID, PLASMA: Lactic Acid, Venous: 1.8 mmol/L (ref 0.5–1.9)

## 2022-04-23 LAB — PROTIME-INR
INR: 1.3 — ABNORMAL HIGH (ref 0.8–1.2)
Prothrombin Time: 15.8 seconds — ABNORMAL HIGH (ref 11.4–15.2)

## 2022-04-23 LAB — PREPARE RBC (CROSSMATCH)

## 2022-04-23 LAB — GLUCOSE, CAPILLARY: Glucose-Capillary: 172 mg/dL — ABNORMAL HIGH (ref 70–99)

## 2022-04-23 SURGERY — LAPAROTOMY, EXPLORATORY
Anesthesia: General

## 2022-04-23 MED ORDER — MORPHINE SULFATE (PF) 2 MG/ML IV SOLN
1.0000 mg | INTRAVENOUS | Status: DC | PRN
  Administered 2022-04-24: 2 mg via INTRAVENOUS
  Administered 2022-04-24 (×2): 1 mg via INTRAVENOUS
  Administered 2022-04-24: 2 mg via INTRAVENOUS
  Administered 2022-04-24: 1 mg via INTRAVENOUS
  Administered 2022-04-24: 2 mg via INTRAVENOUS
  Administered 2022-04-25 – 2022-04-26 (×3): 1 mg via INTRAVENOUS
  Filled 2022-04-23 (×9): qty 1

## 2022-04-23 MED ORDER — LIDOCAINE 2% (20 MG/ML) 5 ML SYRINGE
INTRAMUSCULAR | Status: AC
  Filled 2022-04-23: qty 5

## 2022-04-23 MED ORDER — IOHEXOL 350 MG/ML SOLN
75.0000 mL | Freq: Once | INTRAVENOUS | Status: AC | PRN
  Administered 2022-04-23: 75 mL via INTRAVENOUS

## 2022-04-23 MED ORDER — DIPHENHYDRAMINE HCL 50 MG/ML IJ SOLN
12.5000 mg | Freq: Four times a day (QID) | INTRAMUSCULAR | Status: DC | PRN
  Administered 2022-04-27: 12.5 mg via INTRAVENOUS
  Filled 2022-04-23: qty 1

## 2022-04-23 MED ORDER — FENTANYL CITRATE (PF) 100 MCG/2ML IJ SOLN
25.0000 ug | INTRAMUSCULAR | Status: DC | PRN
  Administered 2022-04-23: 25 ug via INTRAVENOUS

## 2022-04-23 MED ORDER — CEFAZOLIN SODIUM-DEXTROSE 2-3 GM-%(50ML) IV SOLR
INTRAVENOUS | Status: DC | PRN
  Administered 2022-04-23: 2 g via INTRAVENOUS

## 2022-04-23 MED ORDER — PROTHROMBIN COMPLEX CONC HUMAN 500 UNITS IV KIT: 50.0000 [IU]/kg | PACK | Status: DC

## 2022-04-23 MED ORDER — PROPOFOL 10 MG/ML IV BOLUS
INTRAVENOUS | Status: DC | PRN
  Administered 2022-04-23: 70 mg via INTRAVENOUS

## 2022-04-23 MED ORDER — FENTANYL CITRATE PF 50 MCG/ML IJ SOSY
50.0000 ug | PREFILLED_SYRINGE | Freq: Once | INTRAMUSCULAR | Status: AC
  Administered 2022-04-23: 50 ug via INTRAVENOUS
  Filled 2022-04-23: qty 1

## 2022-04-23 MED ORDER — ONDANSETRON HCL 4 MG/2ML IJ SOLN
4.0000 mg | Freq: Four times a day (QID) | INTRAMUSCULAR | Status: DC | PRN
  Administered 2022-04-24 – 2022-05-05 (×4): 4 mg via INTRAVENOUS
  Filled 2022-04-23 (×4): qty 2

## 2022-04-23 MED ORDER — 0.9 % SODIUM CHLORIDE (POUR BTL) OPTIME
TOPICAL | Status: DC | PRN
  Administered 2022-04-23: 1000 mL

## 2022-04-23 MED ORDER — ONDANSETRON HCL 4 MG/2ML IJ SOLN: 4.0000 mg | Freq: Once | INTRAMUSCULAR | Status: AC

## 2022-04-23 MED ORDER — ALBUMIN HUMAN 5 % IV SOLN: INTRAVENOUS | Status: DC | PRN

## 2022-04-23 MED ORDER — DIPHENHYDRAMINE HCL 12.5 MG/5ML PO ELIX: 12.5000 mg | ORAL_SOLUTION | Freq: Four times a day (QID) | ORAL | Status: DC | PRN

## 2022-04-23 MED ORDER — FENTANYL CITRATE (PF) 100 MCG/2ML IJ SOLN
INTRAMUSCULAR | Status: AC
  Filled 2022-04-23: qty 2

## 2022-04-23 MED ORDER — ESMOLOL HCL 100 MG/10ML IV SOLN
INTRAVENOUS | Status: AC
  Filled 2022-04-23: qty 10

## 2022-04-23 MED ORDER — DEXAMETHASONE SODIUM PHOSPHATE 10 MG/ML IJ SOLN
INTRAMUSCULAR | Status: AC
  Filled 2022-04-23: qty 1

## 2022-04-23 MED ORDER — EMPTY CONTAINERS FLEXIBLE MISC
900.0000 mg | Freq: Once | Status: AC
  Administered 2022-04-23: 900 mg via INTRAVENOUS
  Filled 2022-04-23: qty 80

## 2022-04-23 MED ORDER — PROPOFOL 10 MG/ML IV BOLUS
INTRAVENOUS | Status: AC
  Filled 2022-04-23: qty 20

## 2022-04-23 MED ORDER — ORAL CARE MOUTH RINSE: 15.0000 mL | OROMUCOSAL | Status: DC | PRN

## 2022-04-23 MED ORDER — FENTANYL CITRATE (PF) 250 MCG/5ML IJ SOLN
INTRAMUSCULAR | Status: DC | PRN
  Administered 2022-04-23 (×3): 50 ug via INTRAVENOUS

## 2022-04-23 MED ORDER — POTASSIUM CHLORIDE IN NACL 20-0.9 MEQ/L-% IV SOLN
INTRAVENOUS | Status: DC
  Filled 2022-04-23: qty 1000

## 2022-04-23 MED ORDER — SUCCINYLCHOLINE CHLORIDE 200 MG/10ML IV SOSY
PREFILLED_SYRINGE | INTRAVENOUS | Status: DC | PRN
  Administered 2022-04-23: 80 mg via INTRAVENOUS

## 2022-04-23 MED ORDER — ONDANSETRON HCL 4 MG/2ML IJ SOLN
INTRAMUSCULAR | Status: AC
  Filled 2022-04-23: qty 2

## 2022-04-23 MED ORDER — SUGAMMADEX SODIUM 200 MG/2ML IV SOLN
INTRAVENOUS | Status: DC | PRN
  Administered 2022-04-23 (×2): 100 mg via INTRAVENOUS

## 2022-04-23 MED ORDER — SUCCINYLCHOLINE CHLORIDE 200 MG/10ML IV SOSY
PREFILLED_SYRINGE | INTRAVENOUS | Status: AC
  Filled 2022-04-23: qty 10

## 2022-04-23 MED ORDER — ONDANSETRON HCL 4 MG/2ML IJ SOLN
INTRAMUSCULAR | Status: DC | PRN
  Administered 2022-04-23: 4 mg via INTRAVENOUS

## 2022-04-23 MED ORDER — PHENYLEPHRINE HCL-NACL 20-0.9 MG/250ML-% IV SOLN
INTRAVENOUS | Status: DC | PRN
  Administered 2022-04-23: 50 ug/min via INTRAVENOUS

## 2022-04-23 MED ORDER — SODIUM CHLORIDE 0.9 % IV SOLN
10.0000 mL/h | Freq: Once | INTRAVENOUS | Status: AC
  Administered 2022-04-23: 10 mL/h via INTRAVENOUS

## 2022-04-23 MED ORDER — MICROFIBRILLAR COLL HEMOSTAT EX PADS
MEDICATED_PAD | CUTANEOUS | Status: DC | PRN
  Administered 2022-04-23: 1 via TOPICAL

## 2022-04-23 MED ORDER — AMISULPRIDE (ANTIEMETIC) 5 MG/2ML IV SOLN
INTRAVENOUS | Status: AC
  Administered 2022-04-23: 5 mg via INTRAVENOUS
  Filled 2022-04-23: qty 2

## 2022-04-23 MED ORDER — LACTATED RINGERS IV SOLN: INTRAVENOUS | Status: DC | PRN

## 2022-04-23 MED ORDER — CHLORHEXIDINE GLUCONATE CLOTH 2 % EX PADS
6.0000 | MEDICATED_PAD | Freq: Every day | CUTANEOUS | Status: DC
  Administered 2022-04-23 – 2022-05-05 (×14): 6 via TOPICAL

## 2022-04-23 MED ORDER — ONDANSETRON HCL 4 MG/2ML IJ SOLN
INTRAMUSCULAR | Status: AC
  Administered 2022-04-23: 4 mg via INTRAVENOUS
  Filled 2022-04-23: qty 2

## 2022-04-23 MED ORDER — DEXAMETHASONE SODIUM PHOSPHATE 10 MG/ML IJ SOLN
INTRAMUSCULAR | Status: DC | PRN
  Administered 2022-04-23: 5 mg via INTRAVENOUS

## 2022-04-23 MED ORDER — LIDOCAINE 2% (20 MG/ML) 5 ML SYRINGE
INTRAMUSCULAR | Status: DC | PRN
  Administered 2022-04-23: 60 mg via INTRAVENOUS

## 2022-04-23 MED ORDER — SODIUM CHLORIDE 0.9 % IV BOLUS
1000.0000 mL | Freq: Once | INTRAVENOUS | Status: AC
  Administered 2022-04-23: 1000 mL via INTRAVENOUS

## 2022-04-23 MED ORDER — ROCURONIUM BROMIDE 10 MG/ML (PF) SYRINGE
PREFILLED_SYRINGE | INTRAVENOUS | Status: DC | PRN
  Administered 2022-04-23: 50 mg via INTRAVENOUS

## 2022-04-23 MED ORDER — AMISULPRIDE (ANTIEMETIC) 5 MG/2ML IV SOLN: 5.0000 mg | Freq: Once | INTRAVENOUS | Status: AC

## 2022-04-23 MED ORDER — TETANUS-DIPHTH-ACELL PERTUSSIS 5-2.5-18.5 LF-MCG/0.5 IM SUSY
0.5000 mL | PREFILLED_SYRINGE | Freq: Once | INTRAMUSCULAR | Status: AC
  Administered 2022-04-23: 0.5 mL via INTRAMUSCULAR
  Filled 2022-04-23: qty 0.5

## 2022-04-23 MED ORDER — ONDANSETRON 4 MG PO TBDP
4.0000 mg | ORAL_TABLET | Freq: Four times a day (QID) | ORAL | Status: DC | PRN
  Administered 2022-05-03: 4 mg via ORAL
  Filled 2022-04-23: qty 1

## 2022-04-23 MED ORDER — PANTOPRAZOLE SODIUM 40 MG IV SOLR
40.0000 mg | Freq: Every day | INTRAVENOUS | Status: DC
  Administered 2022-04-24 – 2022-05-02 (×10): 40 mg via INTRAVENOUS
  Filled 2022-04-23 (×10): qty 10

## 2022-04-23 MED ORDER — FENTANYL CITRATE (PF) 250 MCG/5ML IJ SOLN
INTRAMUSCULAR | Status: AC
  Filled 2022-04-23: qty 5

## 2022-04-23 SURGICAL SUPPLY — 42 items
APL PRP STRL LF DISP 70% ISPRP (MISCELLANEOUS) ×1
BLADE CLIPPER SURG (BLADE) IMPLANT
CANISTER SUCT 3000ML PPV (MISCELLANEOUS) ×1 IMPLANT
CHLORAPREP W/TINT 26 (MISCELLANEOUS) ×1 IMPLANT
COVER SURGICAL LIGHT HANDLE (MISCELLANEOUS) ×1 IMPLANT
DRAPE LAPAROSCOPIC ABDOMINAL (DRAPES) ×1 IMPLANT
DRAPE UNIVERSAL (DRAPES) ×1 IMPLANT
DRAPE WARM FLUID 44X44 (DRAPES) ×1 IMPLANT
DRSG OPSITE POSTOP 4X10 (GAUZE/BANDAGES/DRESSINGS) IMPLANT
DRSG OPSITE POSTOP 4X8 (GAUZE/BANDAGES/DRESSINGS) IMPLANT
ELECT BLADE 6.5 EXT (BLADE) IMPLANT
ELECT CAUTERY BLADE 6.4 (BLADE) ×1 IMPLANT
ELECT REM PT RETURN 9FT ADLT (ELECTROSURGICAL) ×1
ELECTRODE REM PT RTRN 9FT ADLT (ELECTROSURGICAL) ×1 IMPLANT
GLOVE BIO SURGEON STRL SZ 6.5 (GLOVE) ×1 IMPLANT
GLOVE BIOGEL PI IND STRL 6 (GLOVE) ×1 IMPLANT
GOWN STRL REUS W/ TWL LRG LVL3 (GOWN DISPOSABLE) ×2 IMPLANT
GOWN STRL REUS W/TWL LRG LVL3 (GOWN DISPOSABLE) ×2
HANDLE SUCTION POOLE (INSTRUMENTS) ×1 IMPLANT
KIT BASIN OR (CUSTOM PROCEDURE TRAY) ×1 IMPLANT
KIT TURNOVER KIT B (KITS) ×1 IMPLANT
LIGASURE IMPACT 36 18CM CVD LR (INSTRUMENTS) IMPLANT
NS IRRIG 1000ML POUR BTL (IV SOLUTION) ×2 IMPLANT
PACK GENERAL/GYN (CUSTOM PROCEDURE TRAY) ×1 IMPLANT
PAD ARMBOARD 7.5X6 YLW CONV (MISCELLANEOUS) ×1 IMPLANT
PENCIL SMOKE EVACUATOR (MISCELLANEOUS) ×1 IMPLANT
SPONGE T-LAP 18X18 ~~LOC~~+RFID (SPONGE) IMPLANT
STAPLER VISISTAT 35W (STAPLE) ×1 IMPLANT
SUCTION POOLE HANDLE (INSTRUMENTS) ×1
SUT PDS AB 1 TP1 54 (SUTURE) IMPLANT
SUT PDS AB 1 TP1 96 (SUTURE) IMPLANT
SUT SILK 2 0 SH CR/8 (SUTURE) ×1 IMPLANT
SUT SILK 2 0 TIES 10X30 (SUTURE) ×1 IMPLANT
SUT SILK 3 0 SH CR/8 (SUTURE) ×1 IMPLANT
SUT SILK 3 0 TIES 10X30 (SUTURE) ×1 IMPLANT
SUT VIC AB 3-0 SH 18 (SUTURE) IMPLANT
SYR 5ML LL (SYRINGE) IMPLANT
TOWEL GREEN STERILE (TOWEL DISPOSABLE) ×1 IMPLANT
TRAY FOL W/BAG SLVR 16FR STRL (SET/KITS/TRAYS/PACK) IMPLANT
TRAY FOLEY MTR SLVR 16FR STAT (SET/KITS/TRAYS/PACK) IMPLANT
TRAY FOLEY W/BAG SLVR 16FR LF (SET/KITS/TRAYS/PACK) ×1
YANKAUER SUCT BULB TIP NO VENT (SUCTIONS) IMPLANT

## 2022-04-23 NOTE — ED Provider Notes (Signed)
[facility] EMERGENCY DEPARTMENT Provider Note   CSN: [id number] Arrival date & time: 04/23/22  1759     History  Chief Complaint  Patient presents with   Barbie Croston is a 88 y.o. female.   Fall  The patient is a 88 year old female with past medical history of lymphoma (in remission) dementia, hypothyroidism, HLD, history of PE on Eliquis, second-degree AV block (s/p pacemaker), CVA, thrombocytopenia, and recurrent falls presenting from her independent living facility by EMS after a fall.  The patient does not remember falling and is not able to provide additional details of the EMS was called and patient was noted to be hemodynamically stable, alert and at her mental status baseline.     Home Medications Prior to Admission medications   Medication Sig Start Date End Date Taking? Authorizing Provider  apixaban (ELIQUIS) 2.5 MG TABS tablet Take 1 tablet (2.5 mg total) by mouth 2 (two) times daily. Resume Sunday with the evening dose. 11/06/21  Yes Shirley Friar, PA-C  acetaminophen (TYLENOL) 500 MG tablet Take 1,000 mg by mouth every 6 (six) hours as needed for moderate pain.    [provider]  amLODipine (NORVASC) 2.5 MG tablet Take 2.5 mg by mouth daily. 01/21/22   [provider]  atorvastatin (LIPITOR) 20 MG tablet Take 1 tablet (20 mg total) by mouth daily. Patient taking differently: Take 20 mg by mouth every evening. 05/18/21   Ghimire, Henreitta Leber, MD  carvedilol (COREG) 6.25 MG tablet Take 1 tablet (6.25 mg total) by mouth 2 (two) times daily with a meal. 11/02/21   Tillery, Satira Mccallum, PA-C  citalopram (CELEXA) 10 MG tablet Take 10 mg by mouth daily. 02/02/21   [provider]  furosemide (LASIX) 20 MG tablet Take 20-40 mg by mouth See admin instructions. 40 mg on Monday,Wednesday and Friday 20 mg on Tuesday,Thursday,Saturday and sunday    [provider]  levothyroxine (SYNTHROID) 25 MCG tablet  Take 25 mcg by mouth daily before breakfast.  09/20/18   [provider]  lidocaine-prilocaine (EMLA) cream Apply to affected area once Patient taking differently: Apply 1 application  topically See admin instructions. Apply to port site prior to port access 04/13/20   Brunetta Genera, MD  omeprazole (PRILOSEC) 20 MG capsule Take 1 capsule (20 mg total) by mouth daily. 10/11/21   Kommor, Madison, MD  Polyethyl Glycol-Propyl Glycol (SYSTANE) 0.4-0.3 % GEL ophthalmic gel Place 1 application into both eyes daily as needed (dry eyes).    [provider]  sodium chloride (MURO 128) 5 % ophthalmic ointment Place 1 application  into both eyes at bedtime as needed for eye irritation.    [provider]  sucralfate (CARAFATE) 1 GM/10ML suspension SMARTSIG:Milliliter(s) By Mouth Patient not taking: Reported on 03/15/2022 03/14/22   [provider]  zolpidem (AMBIEN) 5 MG tablet Take 5 mg by mouth at bedtime. 12/07/19   [provider]      Allergies    Alendronate sodium, Amoxicillin-pot clavulanate, Codeine, Lisinopril, Septra [sulfamethoxazole-trimethoprim], Thiazide-type diuretics, and Latex    Review of Systems   Review of Systems  See HPI  Physical Exam Updated Vital Signs BP (!) 176/81 (BP Location: Left Arm)   Pulse 61   Temp (!) 97.1 F (36.2 C)   Resp 12   SpO2 97%  Physical Exam Vitals and nursing note reviewed.  Constitutional:      General: She is not in acute distress.  Appearance: She is well-developed.  HENT:     Head:     Comments: 4 cm laceration with underlying hematoma to the scalp. Eyes:     Conjunctiva/sclera: Conjunctivae normal.  Cardiovascular:     Rate and Rhythm: Normal rate and regular rhythm.     Heart sounds: No murmur heard. Pulmonary:     Effort: Pulmonary effort is normal. No respiratory distress.     Breath sounds: Normal breath sounds.  Abdominal:     Palpations: Abdomen is soft.     Comments: Diffuse  abdominal tenderness to palpation.  Musculoskeletal:        General: No swelling.     Cervical back: Neck supple.     Comments: Tenderness palpation over the anterior chest wall.  Bruising to the fourth and fifth knuckles on the left hand.  Tender to palpation of the left upper extremity from the shoulder to the wrist.  Skin:    General: Skin is warm and dry.     Capillary Refill: Capillary refill takes less than 2 seconds.  Neurological:     General: No focal deficit present.     Mental Status: She is alert.     Comments: Oriented to name, situation, location.  Consistent with mental status baseline.  Psychiatric:        Mood and Affect: Mood normal.     ED Results / Procedures / Treatments   Labs (all labs ordered are listed, but only abnormal results are displayed) Labs Reviewed  COMPREHENSIVE METABOLIC PANEL - Abnormal; Notable for the following components:      Result Value   Glucose, Bld 160 (*)    BUN 26 (*)    Total Protein 5.5 (*)    Albumin 3.3 (*)    All other components within normal limits  CBC - Abnormal; Notable for the following components:   RBC 3.86 (*)    Hemoglobin 11.6 (*)    HCT 35.8 (*)    Platelets 107 (*)    All other components within normal limits  PROTIME-INR - Abnormal; Notable for the following components:   Prothrombin Time 15.8 (*)    INR 1.3 (*)    All other components within normal limits  I-STAT CHEM 8, ED - Abnormal; Notable for the following components:   BUN 36 (*)    Glucose, Bld 159 (*)    Calcium, Ion 1.13 (*)    Hemoglobin 10.9 (*)    HCT 32.0 (*)    All other components within normal limits  POCT I-STAT 7, (LYTES, BLD GAS, ICA,H+H) - Abnormal; Notable for the following components:   pO2, Arterial 449 (*)    HCT 25.0 (*)    Hemoglobin 8.5 (*)    All other components within normal limits  LACTIC ACID, PLASMA  APTT  ETHANOL  URINALYSIS, ROUTINE W REFLEX MICROSCOPIC  TYPE AND SCREEN  PREPARE RBC (CROSSMATCH)  SURGICAL  PATHOLOGY, GROSS ONLY (NOT [facility])    EKG None  Radiology CT CERVICAL SPINE WO CONTRAST  Result Date: 04/23/2022 CLINICAL DATA:  Polytrauma, blunt EXAM: CT CERVICAL SPINE WITHOUT CONTRAST TECHNIQUE: Multidetector CT imaging of the cervical spine was performed without intravenous contrast. Multiplanar CT image reconstructions were also generated. RADIATION DOSE REDUCTION: This exam was performed according to the departmental dose-optimization program which includes automated exposure control, adjustment of the mA and/or kV according to patient size and/or use of iterative reconstruction technique. COMPARISON:  None Available. FINDINGS: Alignment: Normal Skull base and vertebrae: No acute fracture.  No primary bone lesion or focal pathologic process. Soft tissues and spinal canal: No prevertebral fluid or swelling. No visible canal hematoma. Disc levels: Diffuse degenerative disc disease, severe at C5-6 and C6-7. Advanced bilateral degenerative facet disease. Upper chest: Biapical scarring.  Small right pleural effusion. Other: None IMPRESSION: Moderate to advanced degenerative disc and facet disease. No acute bony abnormality. Biapical scarring.  Small right pleural effusion. Electronically Signed   By: Rolm Baptise M.D.   On: 04/23/2022 20:09   CT HEAD WO CONTRAST  Result Date: 04/23/2022 CLINICAL DATA:  Head trauma, moderate-severe EXAM: CT HEAD WITHOUT CONTRAST TECHNIQUE: Contiguous axial images were obtained from the base of the skull through the vertex without intravenous contrast. RADIATION DOSE REDUCTION: This exam was performed according to the departmental dose-optimization program which includes automated exposure control, adjustment of the mA and/or kV according to patient size and/or use of iterative reconstruction technique. COMPARISON:  None Available. FINDINGS: Brain: There is atrophy and chronic small vessel disease changes. No acute intracranial abnormality. Specifically, no hemorrhage,  hydrocephalus, mass lesion, acute infarction, or significant intracranial injury. Vascular: No hyperdense vessel or unexpected calcification. Skull: No acute calvarial abnormality. Sinuses/Orbits: No acute findings Other: Soft tissue swelling in the left lateral forehead IMPRESSION: Atrophy, chronic microvascular disease. No acute intracranial abnormality. Electronically Signed   By: Rolm Baptise M.D.   On: 04/23/2022 20:05   CT CHEST ABDOMEN PELVIS W CONTRAST  Result Date: 04/23/2022 CLINICAL DATA:  Polytrauma, blunt [id number] Trauma [id number] EXAM: CT CHEST, ABDOMEN, AND PELVIS WITH CONTRAST TECHNIQUE: Multidetector CT imaging of the chest, abdomen and pelvis was performed following the standard protocol during bolus administration of intravenous contrast. RADIATION DOSE REDUCTION: This exam was performed according to the departmental dose-optimization program which includes automated exposure control, adjustment of the mA and/or kV according to patient size and/or use of iterative reconstruction technique. CONTRAST:  57mL OMNIPAQUE IOHEXOL 350 MG/ML SOLN COMPARISON:  02/22/2021 FINDINGS: CT CHEST FINDINGS Cardiovascular: Pacer in place with leads in the right atrium and right ventricle. Cardiomegaly. Aortic and coronary artery calcifications. Ascending thoracic aorta upper limits normal at 3.9 cm. Mediastinum/Nodes: No mediastinal, hilar, or axillary adenopathy. Trachea and esophagus are unremarkable. Thyroid unremarkable. Lungs/Pleura: Small right pleural effusion. No confluent airspace opacities or effusions. Musculoskeletal: No acute bony abnormality. Chest wall soft tissues are unremarkable. CT ABDOMEN PELVIS FINDINGS Hepatobiliary: No focal hepatic abnormality. Gallbladder unremarkable. Pancreas: No focal abnormality or ductal dilatation. Spleen: Severe splenic laceration within the superior pole of the spleen with active extravasation of contrast compatible with active bleeding. Large amount of surrounding  hematoma in the left upper quadrant. Adrenals/Urinary Tract: Large cyst in the left kidney measures 8.7 cm. Small nonobstructing stones in the lower pole of the left kidney. No hydronephrosis. Adrenal glands and urinary bladder unremarkable. Stomach/Bowel: Stomach, large and small bowel grossly unremarkable. Sigmoid diverticulosis. Vascular/Lymphatic: Aortic atherosclerosis. Small scattered retroperitoneal lymph nodes. No pathologically enlarged nodes. Reproductive: No visible focal abnormality. Other: Large hemoperitoneum in the abdomen and pelvis. Musculoskeletal: No acute bony abnormality. IMPRESSION: Severe splenic laceration in the superior pole of the spleen with active extravasation of contrast and large hemoperitoneum. Small right pleural effusion. No visible rib fracture or pneumothorax. Cardiomegaly, coronary artery disease, aortic atherosclerosis. Critical Value/emergent results were called by telephone at the time of interpretation on 04/23/2022 at 7:59 pm to provider Dr. Bobbye Morton, Who verbally acknowledged these results. Electronically Signed   By: Rolm Baptise M.D.   On: 04/23/2022 20:01   DG Chest [facility] [address]  Result Date: 04/23/2022 CLINICAL DATA:  Fall EXAM: PORTABLE CHEST 1 VIEW COMPARISON:  02/22/2022 FINDINGS: Right Port-A-Cath and left pacer remain in place, unchanged. Heart is normal size. Mediastinal contours within normal limits. Aortic atherosclerosis. No confluent airspace opacities, effusions or pneumothorax. No acute bony abnormality. IMPRESSION: No active disease. Electronically Signed   By: Rolm Baptise M.D.   On: 04/23/2022 19:12   DG Pelvis Portable  Result Date: 04/23/2022 CLINICAL DATA:  Trauma, fall EXAM: PORTABLE PELVIS 1-2 VIEWS COMPARISON:  None Available. FINDINGS: Mild degenerative changes in the hips, right greater than left. SI joints symmetric and unremarkable. No acute bony abnormality. Specifically, no fracture, subluxation, or dislocation. IMPRESSION: No acute bony  abnormality. Electronically Signed   By: Rolm Baptise M.D.   On: 04/23/2022 19:11    Procedures Procedures    Medications Ordered in ED Medications  fentaNYL (SUBLIMAZE) injection 25-50 mcg (25 mcg Intravenous Given 04/23/22 2226)  fentaNYL (SUBLIMAZE) 100 MCG/2ML injection (has no administration in time range)  fentaNYL (SUBLIMAZE) injection 50 mcg (50 mcg Intravenous Given 04/23/22 1839)  Tdap (BOOSTRIX) injection 0.5 mL (0.5 mLs Intramuscular Given 04/23/22 1948)  ondansetron (ZOFRAN) injection 4 mg (4 mg Intravenous Given 04/23/22 1845)  sodium chloride 0.9 % bolus 1,000 mL (1,000 mLs Intravenous New Bag/Given 04/23/22 1854)  0.9 %  sodium chloride infusion (10 mL/hr Intravenous New Bag/Given 04/23/22 1959)  coag fact Xa recombinant (ANDEXXA) low dose infusion 900 mg (900 mg Intravenous New Bag/Given 04/23/22 1944)  iohexol (OMNIPAQUE) 350 MG/ML injection 75 mL (75 mLs Intravenous Contrast Given 04/23/22 1945)  amisulpride (BARHEMSYS) injection 5 mg (5 mg Intravenous Given 04/23/22 2236)    ED Course/ Medical Decision Making/ A&P                           Medical Decision Making  The patient is a 88 year old female with past medical history of lymphoma (in remission) dementia, hypothyroidism, HLD, history of PE on Eliquis, second-degree AV block (s/p pacemaker), CVA, thrombocytopenia, and recurrent falls presenting from her independent living facility by EMS after a fall.  The differential diagnosis considered includes: Mechanical fall versus syncope, hypoglycemia, arrhythmia, electrolyte abnormality, infection, dehydration.  On arrival in EMS, patient was hemodynamically stable with a systolic blood pressure in the 130s and at her mental status baseline which is oriented to name, situation, and location, but disoriented to date.  The patient was complaining of pain in her left shoulder and arm.  A c-collar was placed and a primary survey was conducted which revealed an intact airway with  bilateral breath sounds.  A chest x-ray was ordered bedside which showed a right-sided Port-A-Cath and left-sided pacemaker without signs of focal consolidation, pneumothorax, rib fracture, or other cardiopulmonary abnormality.  She also received a pelvic x-ray which did not show any signs of fracture or dislocation.  The patient's secondary survey was completed and she was noted to have diffuse tenderness to palpation over the anterior chest wall, tenderness of the left arm, and diffuse abdominal tenderness to palpation.  The patient was given 50 mcg of fentanyl for pain and tetanus was updated.  She began complaining of nausea and was given Zofran.  The patient was being prepped for transfer to the scanner in which she was placed on a portable monitor her blood pressure was noted to be in the 80s/50s.  Her prior blood pressures had all been systolics greater than 161.  The patient was also noted to be  getting diaphoretic.  The blood pressure was repeated several times and a manual blood pressure was obtained which confirmed a BP of 83/45.  I performed a FAST exam and patient was positive for free fluid in Morison's pouch.  She was immediately upgraded to a level 1 trauma.  Pharmacy was at bedside and the patient's Eliquis was reversed, and the patient was given a bolus of IV fluids and emergency release blood.  After several minutes of fluids and blood running, the patient's pressure was rechecked and she was found to be responsive with systolics in the 93X.  She was transferred to the scanner for further imaging.  I independently reviewed all parts of the patient's diagnostic work-up.  The patient's work-up included type and screen; PT INR with PT of 15.8 and INR 1.3; PTT 26; CBC with white blood cell count of 5.5 and hemoglobin 11.6; i-STAT Chem-8 with BUN of 36, glucose 159.  A urinalysis and CMP were ordered but were pending at the time the patient became hypotensive.  The patient received a CT head  which showed no acute intercranial abnormality; CT C-spine which showed without acute fracture or dislocation; CT chest abdomen pelvis which showed a severe splenic laceration with active extravasation and large hemoperitoneum.  The patient also had plain film x-rays of the left shoulder, humerus, forearm, and hand ordered however, she was taken emergently to the operating room prior to receiving her plain film x-rays.  Amount and/or Complexity of Data Reviewed Independent Historian: caregiver and EMS    Details: Daughter External Data Reviewed: labs and notes. Labs: ordered. Decision-making details documented in ED Course. Radiology: ordered and independent interpretation performed. Decision-making details documented in ED Course.  Risk Prescription drug management. Decision regarding hospitalization.   Patient's presentation is most consistent with acute presentation with potential threat to life or bodily function.         Final Clinical Impression(s) / ED Diagnoses Final diagnoses:  Fall, initial encounter    Rx / DC Orders ED Discharge Orders     None         Dani Gobble, MD 04/23/22 2244    Margette Fast, MD 04/27/22 1500

## 2022-04-23 NOTE — Op Note (Signed)
EXPLORATORY LAPAROTOMY, SPLENECTOMY, SCALP LACERATION REPAIR  Procedure Note  [name] 04/23/2022   Pre-op Diagnosis: RUPTURED SPLEEN     Post-op Diagnosis: SAME  Procedure(s): EXPLORATORY LAPAROTOMY SPLENECTOMY REPAIR 7 CM SCALP LACERATION  Surgeon(s): Coralie Keens, MD  Anesthesia: General  Staff:  Circulator: Donnald Garre, RN Scrub Person: Kandis Nab  Estimated Blood Loss: 300 mL               Specimens: sent to path  Indications: This is an 88 year old female who presented with a fall on blood thinning medication.  She was a level 1 trauma and seen by our trauma surgeon Dr. Bobbye Morton.  The patient had a CT scan showing a splenic rupture with active extravasation of contrast.  She also had a large amount of hemoperitoneum.  The decision was made to proceed to the operating room for exploratory laparotomy.  As the patient was proceeding to the operating room on a level 1 trauma presented in distress so I took over the case from Dr. Bobbye Morton so she could attend to the other trauma.  Findings: The patient was found to have a rupture of the spleen at the hilum.  It was a large spleen with perihilar adenopathy.  There is a large amount of hemoperitoneum.  No other obvious injuries were identified.  After takedown of the short gastric vessels, there was bruising to the stomach so I normal centimeters of the greater curvature of the stomach  Procedure: The patient was brought to the operating and identified as a correct patient.  She was intubated by anesthesiology and then placed in a supine position on the operating table.  Her abdomen was prepped and draped in usual sterile fashion.  I created a midline incision with a scalpel.  I then took this down through the subtenons tissue and fascia with electrocautery and then opened the peritoneum the entire length of the incision.  I then packed multiple laparotomy pads in the patient's left upper quadrant.  I then  evaluated the abdomen which contained a large amount of hemoperitoneum.  I identified the enlarged spleen which is actively bleeding from the hilum and several lacerations superior to this.  I took down the hilum emergently with Kelly clamps.  There was moderate adenopathy surrounding blood vessels.  I also took down several short gastrics with Kelly clamps as well.  I then transected the hilum with Metzenbaum scissors as well as the short gastric vessels and remove the spleen.  I then tied off all clamps with 2-0 silk ties and 2-0 silk suture ligatures.  Because of the adenopathy had to place several more silk sutures where the hilum had been to achieve hemostasis of the splenic vessels.  I then copiously irrigated the abdomen with normal saline.  Hemostasis appeared to be achieved.  The bed with the spleen to been was mildly oozing so I placed a piece of Surgicel here which appeared to achieve hemostasis.  The greater curvature of the stomach was slightly bruised from takedown of the short gastrics urgently.  I thus elected to imbricate several centimeters of the greater curvature of the stomach in this area with interrupted 2-0 silk sutures.  No evidence of an opening the stomach was identified.  Several attempts were then made to place an NG tube unsuccessfully.  We again g irrigated the abdomen and hemostasis appeared to be achieved.  I then closed the patient's midline fascia with running #1 looped PDS sutures.  The skin was irrigated  with saline and closed with skin staples. I evaluated a laceration on the patient's scalp.  There was a 7 cm laceration that I closed with skin staples.  The patient was stable throughout the procedure.  The decision was made by anesthesiology to then extubate the patient in the operating room.  All counts were correct at the end of the procedure.  Coralie Keens   Date: 04/23/2022  Time: 9:45 PM

## 2022-04-23 NOTE — Anesthesia Preprocedure Evaluation (Addendum)
Anesthesia Evaluation  Patient identified by MRN, date of birth, ID band Patient confused    Reviewed: Allergy & Precautions, H&P , NPO status , Patient's Chart, lab work & pertinent test results, reviewed documented beta blocker date and time   History of Anesthesia Complications (+) PONV and history of anesthetic complications  Airway Mallampati: II  TM Distance: >3 FB Neck ROM: Full    Dental no notable dental hx. (+) Partial Upper, Dental Advisory Given   Pulmonary neg pulmonary ROS, PE   Pulmonary exam normal breath sounds clear to auscultation       Cardiovascular hypertension, Pt. on medications and Pt. on home beta blockers + dysrhythmias  Rhythm:Regular Rate:Normal     Neuro/Psych  Headaches  Anxiety Depression   Dementia CVA    GI/Hepatic Neg liver ROS,GERD  Medicated,,  Endo/Other  Hypothyroidism    Renal/GU negative Renal ROS  negative genitourinary   Musculoskeletal  (+) Arthritis , Osteoarthritis,    Abdominal   Peds  Hematology negative hematology ROS (+)   Anesthesia Other Findings   Reproductive/Obstetrics negative OB ROS                             Anesthesia Physical Anesthesia Plan  ASA: 3 and emergent  Anesthesia Plan: General   Post-op Pain Management:    Induction: Intravenous  PONV Risk Score and Plan: 4 or greater and Ondansetron, Dexamethasone and Treatment may vary due to age or medical condition  Airway Management Planned: Oral ETT  Additional Equipment: Arterial line and CVP  Intra-op Plan:   Post-operative Plan: Possible Post-op intubation/ventilation  Informed Consent: I have reviewed the patients History and Physical, chart, labs and discussed the procedure including the risks, benefits and alternatives for the proposed anesthesia with the patient or authorized representative who has indicated his/her understanding and acceptance.      Dental advisory given  Plan Discussed with: CRNA  Anesthesia Plan Comments:        Anesthesia Quick Evaluation

## 2022-04-23 NOTE — ED Notes (Signed)
Pt states she is feeling nauseous, pt also noted to be diaphoretic.  Pt had a few dry heaves.

## 2022-04-23 NOTE — ED Triage Notes (Signed)
Pt [name] for complaints of a mechanical fall with a head injury and left shoulder/arm pain.  No LOC.  Pt is on blood thinners.  Pt is coming from [facility] assisted [facility].

## 2022-04-23 NOTE — Anesthesia Procedure Notes (Signed)
Procedure Name: Intubation Date/Time: 04/23/2022 8:25 PM  Performed by: Trinna Post., CRNAPre-anesthesia Checklist: Patient identified, Emergency Drugs available, Suction available, Patient being monitored and Timeout performed Patient Re-evaluated:Patient Re-evaluated prior to induction Oxygen Delivery Method: Circle system utilized Preoxygenation: Pre-oxygenation with 100% oxygen Induction Type: IV induction, Rapid sequence and Cricoid Pressure applied Laryngoscope Size: Mac and 3 Grade View: Grade I Tube type: Oral Tube size: 7.0 mm Number of attempts: 1 Airway Equipment and Method: Stylet Placement Confirmation: ETT inserted through vocal cords under direct vision, positive ETCO2 and breath sounds checked- equal and bilateral Secured at: 21 cm Tube secured with: Tape Dental Injury: Teeth and Oropharynx as per pre-operative assessment

## 2022-04-23 NOTE — Transfer of Care (Signed)
Immediate Anesthesia Transfer of Care Note  Patient: Sarah [name]  Procedure(s) Performed: EXPLORATORY LAPAROTOMY, SPLENECTOMY SCALP LACERATION REPAIR  Patient Location: PACU  Anesthesia Type:General  Level of Consciousness: awake, alert , oriented, and drowsy  Airway & Oxygen Therapy: Patient Spontanous Breathing and Patient connected to face mask oxygen  Post-op Assessment: Report given to RN and Post -op Vital signs reviewed and stable  Post vital signs: Reviewed and stable  Last Vitals:  Vitals Value Taken Time  BP 172/86 04/23/22 2215  Temp 36.2 C 04/23/22 2206  Pulse 65 04/23/22 2222  Resp 18 04/23/22 2222  SpO2 97 % 04/23/22 2222  Vitals shown include unvalidated device data.  Last Pain:  Vitals:   04/23/22 1945  TempSrc: Axillary  PainSc:          Complications: No notable events documented.

## 2022-04-23 NOTE — H&P (Signed)
TRAUMA H&P  04/23/2022, 7:46 PM   Chief Complaint: Level 1 trauma activation for hypotension  Primary Survey:  ABC's intact  The patient is an 88 y.o. female.   HPI: 58F s/p mechanical GLF. Patient has dementia and lives at [facility]. On Eliquis for PE/DVT in 2020, last dose 0930 this AM.  Past Medical History:  Diagnosis Date   Allergy    codeine, thiazides   Anxiety    new dx   Arrhythmia    Arthritis    Atrioventricular block, Mobitz type 1, Wenckebach    Breast cancer ([place]) 03/14/2012   bx=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,ER/PR=+,upper inner quad   Cancer ([facility])    breast   Cataract    DVT (deep venous thrombosis) ([place]) 02/2019   left leg   Dyspnea    Edema    both legs feet and toe, abdomen   Glaucoma    laser treated years ago   HOH (hard of hearing)    Hyperlipemia    Hypertension    Hypothyroidism    Pancreatic cyst    benign   PONV (postoperative nausea and vomiting)    Pulmonary embolism ([place]) 02/2019   bilateral    Radiation 06/11/2012-07/12/2012   17 sessions 4250 cGy, 3 sessions 750 cGy   Vertigo    Wears glasses    Wears partial dentures    partial upper    Past Surgical History:  Procedure Laterality Date   ABDOMINAL HYSTERECTOMY  1966   1/2 ovary left in    [place]   lumpectomy-lt   CATARACT EXTRACTION     b/l   COLONOSCOPY     EXCISION MASS NECK Left 03/17/2019    EXCISION MASS NECK (Left Neck)   EXCISION MASS NECK Left 03/17/2019   Procedure: EXCISION MASS NECK;  Surgeon: Helayne Seminole, MD;  Location: [facility];  Service: ENT;  Laterality: Left;   EYE SURGERY Bilateral    bilateral cataract removal   IR IMAGING GUIDED PORT INSERTION  04/23/2019   JOINT REPLACEMENT  2013   rt total knee   JOINT REPLACEMENT  1995   lt total knee   PACEMAKER IMPLANT N/A 11/01/2021   Procedure: PACEMAKER IMPLANT;  Surgeon: Vickie Epley, MD;  Location: [facility];  Service: Cardiovascular;   Laterality: N/A;   PARTIAL MASTECTOMY WITH NEEDLE LOCALIZATION  04/09/2012   Procedure: PARTIAL MASTECTOMY WITH NEEDLE LOCALIZATION;  Surgeon: Adin Hector, MD;  Location: [facility];  Service: General;  Laterality: Right;   SKIN BIOPSY Left 03/17/2019   LEFT THIGH   SKIN BIOPSY Left 03/17/2019   Procedure: Skin Biopsy Left Thigh;  Surgeon: Helayne Seminole, MD;  Location: [facility];  Service: ENT;  Laterality: Left;   TONSILLECTOMY     TOTAL KNEE ARTHROPLASTY  08/16/2011   Procedure: TOTAL KNEE ARTHROPLASTY;  Surgeon: Ninetta Lights, MD;  Location: [facility];  Service: Orthopedics;  Laterality: Right;    No pertinent family history.  Social History:  reports that she has never smoked. She has never used smokeless tobacco. She reports that she does not drink alcohol and does not use drugs.     Allergies:  Allergies  Allergen Reactions   Alendronate Sodium Other (See Comments)    UNK reaction   Amoxicillin-Pot Clavulanate Other (See Comments)    UNK reaction   Codeine Other (See Comments)    Reaction not recalled   Lisinopril Other (See Comments)  UNK reaction   Septra [Sulfamethoxazole-Trimethoprim] Other (See Comments)   Thiazide-Type Diuretics Other (See Comments)    Blurry vision?? (patient stated she has macular degeneration)   Latex Rash    Medications: reviewed  Results for orders placed or performed during the hospital encounter of 04/23/22 (from the past 48 hour(s))  Comprehensive metabolic panel     Status: Abnormal   Collection Time: 04/23/22  6:15 PM  Result Value Ref Range   Sodium 141 135 - 145 mmol/L   Potassium 3.6 3.5 - 5.1 mmol/L   Chloride 105 98 - 111 mmol/L   CO2 26 22 - 32 mmol/L   Glucose, Bld 160 (H) 70 - 99 mg/dL    Comment: Glucose reference range applies only to samples taken after fasting for at least 8 hours.   BUN 26 (H) 8 - 23 mg/dL   Creatinine, Ser 0.91 0.44 - 1.00 mg/dL   Calcium 9.3 8.9 - 10.3 mg/dL   Total Protein 5.5  (L) 6.5 - 8.1 g/dL   Albumin 3.3 (L) 3.5 - 5.0 g/dL   AST 26 15 - 41 U/L   ALT 16 0 - 44 U/L   Alkaline Phosphatase 84 38 - 126 U/L   Total Bilirubin 1.0 0.3 - 1.2 mg/dL   GFR, Estimated >60 >60 mL/min    Comment: (NOTE) Calculated using the CKD-EPI Creatinine Equation (2021)    Anion gap 10 5 - 15    Comment: Performed at [facility], [place] [address], [place], [place] [address]  CBC     Status: Abnormal   Collection Time: 04/23/22  6:15 PM  Result Value Ref Range   WBC 5.5 4.0 - 10.5 K/uL   RBC 3.86 (L) 3.87 - 5.11 MIL/uL   Hemoglobin 11.6 (L) 12.0 - 15.0 g/dL   HCT 35.8 (L) 36.0 - 46.0 %   MCV 92.7 80.0 - 100.0 fL   MCH 30.1 26.0 - 34.0 pg   MCHC 32.4 30.0 - 36.0 g/dL   RDW 13.2 11.5 - 15.5 %   Platelets 107 (L) 150 - 400 K/uL   nRBC 0.0 0.0 - 0.2 %    Comment: Performed at [facility], [place] [address], [place], [place] [address]  Lactic acid, plasma     Status: None   Collection Time: 04/23/22  6:15 PM  Result Value Ref Range   Lactic Acid, Venous 1.8 0.5 - 1.9 mmol/L    Comment: Performed at [facility] [address] [place], [place] [address]  Protime-INR     Status: Abnormal   Collection Time: 04/23/22  6:15 PM  Result Value Ref Range   Prothrombin Time 15.8 (H) 11.4 - 15.2 seconds   INR 1.3 (H) 0.8 - 1.2    Comment: (NOTE) INR goal varies based on device and disease states. Performed at [facility], [place] [address] [place], [place] [address]   APTT     Status: None   Collection Time: 04/23/22  6:15 PM  Result Value Ref Range   aPTT 26 24 - 36 seconds    Comment: Performed at [facility] [address] [place], [place] [address]  Type and screen [place]     Status: None (Preliminary result)   Collection Time: 04/23/22  6:30 PM  Result Value Ref Range   ABO/RH(D) A POS    Antibody Screen NEG    Sample Expiration 04/26/2022,2359    Unit Number [id number]  Blood Component Type RED CELLS,LR    Unit  division 00    Status of Unit ISSUED    Unit tag comment VERBAL ORDERS PER DR LONG    Transfusion Status OK TO TRANSFUSE    Crossmatch Result COMPATIBLE    Unit Number [id number]    Blood Component Type RED CELLS,LR    Unit division 00    Status of Unit ALLOCATED    Transfusion Status OK TO TRANSFUSE    Crossmatch Result      Compatible Performed at [facility], [place] [address], [place], [place] [address]   I-Stat Chem 8, ED     Status: Abnormal   Collection Time: 04/23/22  6:35 PM  Result Value Ref Range   Sodium 140 135 - 145 mmol/L   Potassium 4.2 3.5 - 5.1 mmol/L   Chloride 103 98 - 111 mmol/L   BUN 36 (H) 8 - 23 mg/dL   Creatinine, Ser 1.00 0.44 - 1.00 mg/dL   Glucose, Bld 159 (H) 70 - 99 mg/dL    Comment: Glucose reference range applies only to samples taken after fasting for at least 8 hours.   Calcium, Ion 1.13 (L) 1.15 - 1.40 mmol/L   TCO2 28 22 - 32 mmol/L   Hemoglobin 10.9 (L) 12.0 - 15.0 g/dL   HCT 32.0 (L) 36.0 - 46.0 %  Prepare RBC (crossmatch)     Status: None   Collection Time: 04/23/22  7:05 PM  Result Value Ref Range   Order Confirmation      ORDER PROCESSED BY BLOOD BANK Performed at [facility], [place] [address], [place], [place] [address]     DG Chest Port 1 View  Result Date: 04/23/2022 CLINICAL DATA:  Fall EXAM: PORTABLE CHEST 1 VIEW COMPARISON:  02/22/2022 FINDINGS: Right Port-A-Cath and left pacer remain in place, unchanged. Heart is normal size. Mediastinal contours within normal limits. Aortic atherosclerosis. No confluent airspace opacities, effusions or pneumothorax. No acute bony abnormality. IMPRESSION: No active disease. Electronically Signed   By: Rolm Baptise M.D.   On: 04/23/2022 19:12   DG Pelvis Portable  Result Date: 04/23/2022 CLINICAL DATA:  Trauma, fall EXAM: PORTABLE PELVIS 1-2 VIEWS COMPARISON:  None Available. FINDINGS: Mild degenerative changes in the hips, right greater than left. SI joints symmetric and  unremarkable. No acute bony abnormality. Specifically, no fracture, subluxation, or dislocation. IMPRESSION: No acute bony abnormality. Electronically Signed   By: Rolm Baptise M.D.   On: 04/23/2022 19:11    ROS 10 point review of systems is negative except as listed above in HPI.  Blood pressure (!) 83/45, pulse 75, temperature 98 F (36.7 C), temperature source Oral, resp. rate 17, SpO2 99 %.  Secondary Survey:  GCS: E(4)//V(5)//M(6) Constitutional: well-developed, well-nourished Skull: normocephalic, atraumatic Eyes: pupils equal, round, reactive to light, 36mm b/l, moist conjunctiva Face/ENT: midface stable without deformity, poor  dentition, external inspection of ears and nose normal, hearing intact  Oropharynx: normal oropharyngeal mucosa, no blood Neck: no thyromegaly, trachea midline, c-collar applied in TB, no midline cervical tenderness to palpation, no C-spine stepoffs Chest: breath sounds equal bilaterally, normal  respiratory effort, no midline or lateral chest wall tenderness to palpation/deformity Abdomen: soft, NT, no bruising, no hepatosplenomegaly FAST: positive Pelvis: stable GU: normal female genitalia Back: no wounds, no T/L spine TTP, no T/L spine stepoffs Rectal: deferred Extremities: 2+  radial and pedal pulses bilaterally, intact motor and sensation of bilateral UE and LE, no peripheral edema MSK: unable  to assess gait/station, no clubbing/cyanosis of fingers/toes, normal ROM of all four extremities Skin: warm, dry, no rashes    Assessment/Plan: Problem List  Mechanical GLF  Plan High grade splenic laceration - 1u pRBC in TB, plan for OR for exlap splenectomy, consent obtained from daughter due to patient's h/o dementia FEN - NPO DVT - SCDs, hold chemical ppx  Dispo - Admit to inpatient--ICU  Family update: provided to daughter  Jesusita Oka, MD General and [place] [facility]

## 2022-04-23 NOTE — Anesthesia Procedure Notes (Signed)
Central Venous Catheter Insertion Performed by: Roderic Palau, MD, anesthesiologist Start/End12/07/2021 8:30 PM, 04/23/2022 8:40 PM Patient location: OR. Preanesthetic checklist: patient identified, IV checked, site marked, risks and benefits discussed, surgical consent, monitors and equipment checked, pre-op evaluation, timeout performed and anesthesia consent Position: Trendelenburg Lidocaine 1% used for infiltration and patient sedated Hand hygiene performed , maximum sterile barriers used  and Seldinger technique used Catheter size: 9 Fr Total catheter length 10. Central line was placed.MAC introducer Procedure performed using ultrasound guided technique. Ultrasound Notes:anatomy identified, needle tip was noted to be adjacent to the nerve/plexus identified, no ultrasound evidence of intravascular and/or intraneural injection and image(s) printed for medical record Attempts: 1 Following insertion, line sutured, dressing applied and Biopatch. Post procedure assessment: blood return through all ports, free fluid flow and no air  Patient tolerated the procedure well with no immediate complications.

## 2022-04-23 NOTE — Anesthesia Postprocedure Evaluation (Signed)
Anesthesia Post Note  Patient: Sarah [name]  Procedure(s) Performed: EXPLORATORY LAPAROTOMY, SPLENECTOMY SCALP LACERATION REPAIR     Patient location during evaluation: PACU Anesthesia Type: General Level of consciousness: awake and alert Pain management: pain level controlled Vital Signs Assessment: post-procedure vital signs reviewed and stable Respiratory status: spontaneous breathing, nonlabored ventilation, respiratory function stable and patient connected to nasal cannula oxygen Cardiovascular status: blood pressure returned to baseline and stable Postop Assessment: no apparent nausea or vomiting Anesthetic complications: no  No notable events documented.  Last Vitals:  Vitals:   04/23/22 2317 04/23/22 2335  BP:    Pulse:  60  Resp:  15  Temp: 36.4 C   SpO2:  100%    Last Pain:  Vitals:   04/23/22 2335  TempSrc:   PainSc: 10-Worst pain ever                 Alaisa Moffitt,W. EDMOND

## 2022-04-24 ENCOUNTER — Inpatient Hospital Stay (HOSPITAL_COMMUNITY): Payer: Medicare Other

## 2022-04-24 ENCOUNTER — Encounter (HOSPITAL_COMMUNITY): Payer: Self-pay | Admitting: Surgery

## 2022-04-24 ENCOUNTER — Other Ambulatory Visit: Payer: Self-pay

## 2022-04-24 DIAGNOSIS — Z86718 Personal history of other venous thrombosis and embolism: Secondary | ICD-10-CM

## 2022-04-24 LAB — MRSA NEXT GEN BY PCR, NASAL: MRSA by PCR Next Gen: NOT DETECTED

## 2022-04-24 LAB — CBC
HCT: 30.8 % — ABNORMAL LOW (ref 36.0–46.0)
HCT: 31 % — ABNORMAL LOW (ref 36.0–46.0)
HCT: 29.6 % — ABNORMAL LOW (ref 36.0–46.0)
Hemoglobin: 10 g/dL — ABNORMAL LOW (ref 12.0–15.0)
Hemoglobin: 10.1 g/dL — ABNORMAL LOW (ref 12.0–15.0)
Hemoglobin: 9.9 g/dL — ABNORMAL LOW (ref 12.0–15.0)
MCH: 29.7 pg (ref 26.0–34.0)
MCH: 29.9 pg (ref 26.0–34.0)
MCH: 29.9 pg (ref 26.0–34.0)
MCHC: 32.5 g/dL (ref 30.0–36.0)
MCHC: 32.6 g/dL (ref 30.0–36.0)
MCHC: 33.4 g/dL (ref 30.0–36.0)
MCV: 91.4 fL (ref 80.0–100.0)
MCV: 91.7 fL (ref 80.0–100.0)
MCV: 89.4 fL (ref 80.0–100.0)
Platelets: 67 10*3/uL — ABNORMAL LOW (ref 150–400)
Platelets: 74 10*3/uL — ABNORMAL LOW (ref 150–400)
Platelets: 82 10*3/uL — ABNORMAL LOW (ref 150–400)
RBC: 3.37 MIL/uL — ABNORMAL LOW (ref 3.87–5.11)
RBC: 3.38 MIL/uL — ABNORMAL LOW (ref 3.87–5.11)
RBC: 3.31 MIL/uL — ABNORMAL LOW (ref 3.87–5.11)
RDW: 13.8 % (ref 11.5–15.5)
RDW: 13.6 % (ref 11.5–15.5)
RDW: 13.8 % (ref 11.5–15.5)
WBC: 8.1 10*3/uL (ref 4.0–10.5)
WBC: 11.4 10*3/uL — ABNORMAL HIGH (ref 4.0–10.5)
WBC: 12.6 10*3/uL — ABNORMAL HIGH (ref 4.0–10.5)
nRBC: 0 % (ref 0.0–0.2)
nRBC: 0 % (ref 0.0–0.2)
nRBC: 0 % (ref 0.0–0.2)

## 2022-04-24 LAB — GLUCOSE, CAPILLARY
Glucose-Capillary: 186 mg/dL — ABNORMAL HIGH (ref 70–99)
Glucose-Capillary: 160 mg/dL — ABNORMAL HIGH (ref 70–99)
Glucose-Capillary: 138 mg/dL — ABNORMAL HIGH (ref 70–99)
Glucose-Capillary: 122 mg/dL — ABNORMAL HIGH (ref 70–99)
Glucose-Capillary: 137 mg/dL — ABNORMAL HIGH (ref 70–99)
Glucose-Capillary: 115 mg/dL — ABNORMAL HIGH (ref 70–99)

## 2022-04-24 LAB — BASIC METABOLIC PANEL
Anion gap: 9 (ref 5–15)
BUN: 22 mg/dL (ref 8–23)
CO2: 26 mmol/L (ref 22–32)
Calcium: 8.6 mg/dL — ABNORMAL LOW (ref 8.9–10.3)
Chloride: 104 mmol/L (ref 98–111)
Creatinine, Ser: 0.73 mg/dL (ref 0.44–1.00)
GFR, Estimated: >60 mL/min (ref >60)
Glucose, Bld: 172 mg/dL — ABNORMAL HIGH (ref 70–99)
Potassium: 3.3 mmol/L — ABNORMAL LOW (ref 3.5–5.1)
Sodium: 139 mmol/L (ref 135–145)

## 2022-04-24 LAB — BLOOD PRODUCT ORDER (VERBAL) VERIFICATION

## 2022-04-24 LAB — ETHANOL: Alcohol, Ethyl (B): <10 mg/dL (ref <10)

## 2022-04-24 MED ORDER — PROCHLORPERAZINE EDISYLATE 10 MG/2ML IJ SOLN
10.0000 mg | Freq: Four times a day (QID) | INTRAMUSCULAR | Status: DC | PRN
  Filled 2022-04-24: qty 2

## 2022-04-24 MED ORDER — LEVOTHYROXINE SODIUM 25 MCG PO TABS
25.0000 ug | ORAL_TABLET | Freq: Every day | ORAL | Status: DC
  Administered 2022-04-24 – 2022-05-06 (×13): 25 ug via ORAL
  Filled 2022-04-24 (×13): qty 1

## 2022-04-24 MED ORDER — FUROSEMIDE 20 MG PO TABS
20.0000 mg | ORAL_TABLET | Freq: Every day | ORAL | Status: DC
  Administered 2022-04-24 – 2022-04-25 (×2): 20 mg via ORAL
  Filled 2022-04-24 (×2): qty 1

## 2022-04-24 MED ORDER — POTASSIUM CHLORIDE 10 MEQ/100ML IV SOLN
10.0000 meq | INTRAVENOUS | Status: AC
  Administered 2022-04-24 (×2): 10 meq via INTRAVENOUS
  Filled 2022-04-24 (×2): qty 100

## 2022-04-24 MED ORDER — LACTATED RINGERS IV SOLN: INTRAVENOUS | Status: DC

## 2022-04-24 MED ORDER — CITALOPRAM HYDROBROMIDE 20 MG PO TABS
10.0000 mg | ORAL_TABLET | Freq: Every day | ORAL | Status: DC
  Administered 2022-04-24 – 2022-05-06 (×10): 10 mg via ORAL
  Filled 2022-04-24 (×13): qty 1

## 2022-04-24 MED ORDER — AMLODIPINE BESYLATE 5 MG PO TABS
2.5000 mg | ORAL_TABLET | Freq: Every day | ORAL | Status: DC
  Administered 2022-04-24 – 2022-04-25 (×2): 2.5 mg via ORAL
  Filled 2022-04-24 (×2): qty 1

## 2022-04-24 MED ORDER — CARVEDILOL 6.25 MG PO TABS
6.2500 mg | ORAL_TABLET | Freq: Two times a day (BID) | ORAL | Status: DC
  Administered 2022-04-24 – 2022-04-26 (×5): 6.25 mg via ORAL
  Filled 2022-04-24 (×5): qty 1

## 2022-04-24 MED ORDER — INSULIN ASPART 100 UNIT/ML IJ SOLN
0.0000 [IU] | INTRAMUSCULAR | Status: DC
  Administered 2022-04-24 (×2): 1 [IU] via SUBCUTANEOUS

## 2022-04-24 MED ORDER — METOPROLOL TARTRATE 5 MG/5ML IV SOLN
5.0000 mg | Freq: Four times a day (QID) | INTRAVENOUS | Status: DC | PRN
  Administered 2022-04-24: 5 mg via INTRAVENOUS
  Filled 2022-04-24: qty 5

## 2022-04-24 NOTE — Progress Notes (Signed)
..  Trauma Event Note    Reason for Call :  Notified by primary RN Colletta Maryland of the following overnight issues and new orders as follows:  ? Art line ok to be removed: Yes ok to remove A line  CBG 172-186 and no SSI coverage: No coverage needed at this time, readdress at morning rounds, pt is currently NPO and no hx of DM  CVC in place s/p Ex lap and splenectomy, ? Removal,2 peripheral lines in place: Readdress at morning rounds - 6am CBC    Last imported Vital Signs BP (!) 169/85   Pulse 68   Temp 98.1 F (36.7 C) (Oral)   Resp 14   SpO2 97%   Trending CBC Recent Labs    04/23/22 1815 04/23/22 1835 04/23/22 2118 04/24/22 0000  WBC 5.5  --   --  8.1  HGB 11.6* 10.9* 8.5* 10.0*  HCT 35.8* 32.0* 25.0* 30.8*  PLT 107*  --   --  67*    Trending Coag's Recent Labs    04/23/22 1815  APTT 26  INR 1.3*    Trending BMET Recent Labs    04/23/22 1815 04/23/22 1835 04/23/22 2118 04/24/22 0410  NA 141 140 138 139  K 3.6 4.2 4.0 3.3*  CL 105 103  --  104  CO2 26  --   --  26  BUN 26* 36*  --  22  CREATININE 0.91 1.00  --  0.73  GLUCOSE 160* 159*  --  172*      Sarah [name]  Trauma Response RN  Please call TRN at [phone] for further assistance.

## 2022-04-24 NOTE — Progress Notes (Signed)
Lower extremity venous bilateral study completed.   Please see CV Proc for preliminary results.   Ellington Cornia, RDMS, RVT  

## 2022-04-24 NOTE — Progress Notes (Signed)
Patient ID: Sarah [name], female   DOB: 09-26-1932, 88 y.o.   MRN: [id number] Follow up - Trauma Critical Care   Patient Details:    Sarah [name] is an 88 y.o. female.  Lines/tubes : Implanted Port 04/23/19 Right Chest (Active)     External Urinary Catheter (Active)  Collection Container Dedicated Suction Canister 04/24/22 0100  Suction (Verified suction is between 40-80 mmHg) Yes 04/24/22 0100  Securement Method Mesh underwear 04/24/22 0100  Site Assessment Clean, Dry, Intact 04/24/22 0100  Output (mL) 200 mL 04/24/22 0600    Microbiology/Sepsis markers: Results for orders placed or performed during the hospital encounter of 04/23/22  MRSA Next Gen by PCR, Nasal     Status: None   Collection Time: 04/23/22 11:20 PM   Specimen: Nasal Mucosa; Nasal Swab  Result Value Ref Range Status   MRSA by PCR Next Gen NOT DETECTED NOT DETECTED Final    Comment: (NOTE) The GeneXpert MRSA Assay (FDA approved for NASAL specimens only), is one component of a comprehensive MRSA colonization surveillance program. It is not intended to diagnose MRSA infection nor to guide or monitor treatment for MRSA infections. Test performance is not FDA approved in patients less than 17 years old. Performed at [facility], [place] [address], [place], [place] [address]     Anti-infectives:  Anti-infectives (From admission, onward)    None      Subjective:    Overnight Issues:   Objective:  Vital signs for last 24 hours: Temp:  [97.1 F (36.2 C)-99 F (37.2 C)] 99 F (37.2 C) (12/04 0715) Pulse Rate:  [60-82] 69 (12/04 0700) Resp:  [12-33] 13 (12/04 0700) BP: (83-181)/(45-100) 166/88 (12/04 0700) SpO2:  [93 %-100 %] 100 % (12/04 0700) Arterial Line BP: (177-194)/(73-92) 183/76 (12/04 0205)  Hemodynamic parameters for last 24 hours:    Intake/Output from previous day: 12/03 0701 - 12/04 0700 In: 2304 [I.V.:1729; Blood:325; IV Piggyback:250] Out: 1125 [Urine:825;  Blood:300]  Intake/Output this shift: No intake/output data recorded.  Vent settings for last 24 hours:    Physical Exam:  General: alert and no respiratory distress Neuro: alert and F/C HEENT/Neck: L scalp lac intact with staples Resp: clear to auscultation bilaterally CVS: RRR GI: dressing with dry stain, soft Extremities: calves soft  Results for orders placed or performed during the hospital encounter of 04/23/22 (from the past 24 hour(s))  Comprehensive metabolic panel     Status: Abnormal   Collection Time: 04/23/22  6:15 PM  Result Value Ref Range   Sodium 141 135 - 145 mmol/L   Potassium 3.6 3.5 - 5.1 mmol/L   Chloride 105 98 - 111 mmol/L   CO2 26 22 - 32 mmol/L   Glucose, Bld 160 (H) 70 - 99 mg/dL   BUN 26 (H) 8 - 23 mg/dL   Creatinine, Ser 0.91 0.44 - 1.00 mg/dL   Calcium 9.3 8.9 - 10.3 mg/dL   Total Protein 5.5 (L) 6.5 - 8.1 g/dL   Albumin 3.3 (L) 3.5 - 5.0 g/dL   AST 26 15 - 41 U/L   ALT 16 0 - 44 U/L   Alkaline Phosphatase 84 38 - 126 U/L   Total Bilirubin 1.0 0.3 - 1.2 mg/dL   GFR, Estimated >60 >60 mL/min   Anion gap 10 5 - 15  CBC     Status: Abnormal   Collection Time: 04/23/22  6:15 PM  Result Value Ref Range   WBC 5.5 4.0 - 10.5 K/uL  RBC 3.86 (L) 3.87 - 5.11 MIL/uL   Hemoglobin 11.6 (L) 12.0 - 15.0 g/dL   HCT 35.8 (L) 36.0 - 46.0 %   MCV 92.7 80.0 - 100.0 fL   MCH 30.1 26.0 - 34.0 pg   MCHC 32.4 30.0 - 36.0 g/dL   RDW 13.2 11.5 - 15.5 %   Platelets 107 (L) 150 - 400 K/uL   nRBC 0.0 0.0 - 0.2 %  Lactic acid, plasma     Status: None   Collection Time: 04/23/22  6:15 PM  Result Value Ref Range   Lactic Acid, Venous 1.8 0.5 - 1.9 mmol/L  Protime-INR     Status: Abnormal   Collection Time: 04/23/22  6:15 PM  Result Value Ref Range   Prothrombin Time 15.8 (H) 11.4 - 15.2 seconds   INR 1.3 (H) 0.8 - 1.2  APTT     Status: None   Collection Time: 04/23/22  6:15 PM  Result Value Ref Range   aPTT 26 24 - 36 seconds  Type and screen [place]     Status: None (Preliminary result)   Collection Time: 04/23/22  6:30 PM  Result Value Ref Range   ABO/RH(D) A POS    Antibody Screen NEG    Sample Expiration 04/26/2022,2359    Unit Number [id number]    Blood Component Type RED CELLS,LR    Unit division 00    Status of Unit ISSUED    Unit tag comment VERBAL ORDERS PER DR LONG    Transfusion Status OK TO TRANSFUSE    Crossmatch Result COMPATIBLE    Unit Number [id number]    Blood Component Type RED CELLS,LR    Unit division 00    Status of Unit ALLOCATED    Transfusion Status OK TO TRANSFUSE    Crossmatch Result      Compatible Performed at [facility], [address] [place], [place] [address]   I-Stat Chem 8, ED     Status: Abnormal   Collection Time: 04/23/22  6:35 PM  Result Value Ref Range   Sodium 140 135 - 145 mmol/L   Potassium 4.2 3.5 - 5.1 mmol/L   Chloride 103 98 - 111 mmol/L   BUN 36 (H) 8 - 23 mg/dL   Creatinine, Ser 1.00 0.44 - 1.00 mg/dL   Glucose, Bld 159 (H) 70 - 99 mg/dL   Calcium, Ion 1.13 (L) 1.15 - 1.40 mmol/L   TCO2 28 22 - 32 mmol/L   Hemoglobin 10.9 (L) 12.0 - 15.0 g/dL   HCT 32.0 (L) 36.0 - 46.0 %  Prepare RBC (crossmatch)     Status: None   Collection Time: 04/23/22  7:05 PM  Result Value Ref Range   Order Confirmation      ORDER PROCESSED BY BLOOD BANK Performed at [facility], [place] [address], [place], [place] [address]   I-STAT 7, (LYTES, BLD GAS, ICA, H+H)     Status: Abnormal   Collection Time: 04/23/22  9:18 PM  Result Value Ref Range   pH, Arterial 7.408 7.35 - 7.45   pCO2 arterial 39.6 32 - 48 mmHg   pO2, Arterial 449 (H) 83 - 108 mmHg   Bicarbonate 25.2 20.0 - 28.0 mmol/L   TCO2 26 22 - 32 mmol/L   O2 Saturation 100 %   Acid-Base Excess 0.0 0.0 - 2.0 mmol/L   Sodium 138 135 - 145 mmol/L   Potassium 4.0 3.5 - 5.1 mmol/L  Calcium, Ion 1.21 1.15 - 1.40 mmol/L   HCT 25.0 (L) 36.0 - 46.0 %   Hemoglobin 8.5 (L) 12.0 - 15.0 g/dL   Patient  temperature 36.2 C    Sample type ARTERIAL   Glucose, capillary     Status: Abnormal   Collection Time: 04/23/22 11:09 PM  Result Value Ref Range   Glucose-Capillary 172 (H) 70 - 99 mg/dL  MRSA Next Gen by PCR, Nasal     Status: None   Collection Time: 04/23/22 11:20 PM   Specimen: Nasal Mucosa; Nasal Swab  Result Value Ref Range   MRSA by PCR Next Gen NOT DETECTED NOT DETECTED  Ethanol     Status: None   Collection Time: 04/23/22 11:28 PM  Result Value Ref Range   Alcohol, Ethyl (B) <10 <10 mg/dL  Urinalysis, Routine w reflex microscopic Urine, Catheterized     Status: Abnormal   Collection Time: 04/23/22 11:28 PM  Result Value Ref Range   Color, Urine YELLOW YELLOW   APPearance CLEAR CLEAR   Specific Gravity, Urine 1.030 1.005 - 1.030   pH 6.0 5.0 - 8.0   Glucose, UA 50 (A) NEGATIVE mg/dL   Hgb urine dipstick SMALL (A) NEGATIVE   Bilirubin Urine NEGATIVE NEGATIVE   Ketones, ur 5 (A) NEGATIVE mg/dL   Protein, ur 100 (A) NEGATIVE mg/dL   Nitrite NEGATIVE NEGATIVE   Leukocytes,Ua NEGATIVE NEGATIVE   RBC / HPF 6-10 0 - 5 RBC/hpf   WBC, UA 0-5 0 - 5 WBC/hpf   Bacteria, UA NONE SEEN NONE SEEN   Squamous Epithelial / LPF 0-5 0 - 5   Mucus PRESENT    Hyaline Casts, UA PRESENT   CBC     Status: Abnormal   Collection Time: 04/24/22 12:00 AM  Result Value Ref Range   WBC 8.1 4.0 - 10.5 K/uL   RBC 3.37 (L) 3.87 - 5.11 MIL/uL   Hemoglobin 10.0 (L) 12.0 - 15.0 g/dL   HCT 30.8 (L) 36.0 - 46.0 %   MCV 91.4 80.0 - 100.0 fL   MCH 29.7 26.0 - 34.0 pg   MCHC 32.5 30.0 - 36.0 g/dL   RDW 13.8 11.5 - 15.5 %   Platelets 67 (L) 150 - 400 K/uL   nRBC 0.0 0.0 - 0.2 %  Glucose, capillary     Status: Abnormal   Collection Time: 04/24/22  3:02 AM  Result Value Ref Range   Glucose-Capillary 186 (H) 70 - 99 mg/dL  CBC     Status: Abnormal   Collection Time: 04/24/22  4:10 AM  Result Value Ref Range   WBC 11.4 (H) 4.0 - 10.5 K/uL   RBC 3.38 (L) 3.87 - 5.11 MIL/uL   Hemoglobin 10.1 (L) 12.0  - 15.0 g/dL   HCT 31.0 (L) 36.0 - 46.0 %   MCV 91.7 80.0 - 100.0 fL   MCH 29.9 26.0 - 34.0 pg   MCHC 32.6 30.0 - 36.0 g/dL   RDW 13.6 11.5 - 15.5 %   Platelets 74 (L) 150 - 400 K/uL   nRBC 0.0 0.0 - 0.2 %  Basic metabolic panel     Status: Abnormal   Collection Time: 04/24/22  4:10 AM  Result Value Ref Range   Sodium 139 135 - 145 mmol/L   Potassium 3.3 (L) 3.5 - 5.1 mmol/L   Chloride 104 98 - 111 mmol/L   CO2 26 22 - 32 mmol/L   Glucose, Bld 172 (H) 70 - 99 mg/dL  BUN 22 8 - 23 mg/dL   Creatinine, Ser 0.73 0.44 - 1.00 mg/dL   Calcium 8.6 (L) 8.9 - 10.3 mg/dL   GFR, Estimated >60 >60 mL/min   Anion gap 9 5 - 15  Glucose, capillary     Status: Abnormal   Collection Time: 04/24/22  7:13 AM  Result Value Ref Range   Glucose-Capillary 160 (H) 70 - 99 mg/dL    Assessment & Plan: Present on Admission:  Ruptured spleen    LOS: 1 day   Additional comments:I reviewed the patient's new clinical lab test results. / GLF Grade 4 spleen lac with active extravasation - S/P splenectomy by Dr. Ninfa Linden 12/3, AROBF, plan vaccines prior to D/C Scalp lac - repaired in the OR Hx DVT/PE on Eliquis - reversed, check duplex BLE ABL anemia - CBC 1600 and in AM HTN - home meds Hyperglycemia - SSI VTE - PAS for now FEN - IVF, AROBF Dispo - to 4NP, PT/OT I spoke with her daughter at the bedside Critical Care Total Time*: 107 Minutes  Georganna Skeans, MD, MPH, FACS Trauma & General Surgery Use [email or web address] to contact on call provider  04/24/2022  *Care during the described time interval was provided by me. I have reviewed this patient's available data, including medical history, events of note, physical examination and test results as part of my evaluation.

## 2022-04-24 NOTE — TOC CAGE-AID Note (Signed)
Transition of Care [facility]) - CAGE-AID Screening   Patient Details  Name: Sarah [name] MRN: [id number] Date of Birth: 1933-04-29  Clinical Narrative:  Patient admitted after a GLF causing a spleen laceration requiring splenectomy. Patient denies any current alcohol or drug use, no need for resources at this time.  CAGE-AID Screening:    Have You Ever Felt You Ought to Cut Down on Your Drinking or Drug Use?: No Have People Annoyed You By Critizing Your Drinking Or Drug Use?: No Have You Felt Bad Or Guilty About Your Drinking Or Drug Use?: No Have You Ever Had a Drink or Used Drugs First Thing In The Morning to Steady Your Nerves or to Get Rid of a Hangover?: No CAGE-AID Score: 0  Substance Abuse Education Offered: No

## 2022-04-24 NOTE — Progress Notes (Addendum)
Pharmacy Note - Andexanet alfa (Andexxa) For Reversal of Oral Anticoagulant   Patient known to take Apixaban 2.5 mg Twice Daily Last dose of above taken on 04/23/22 at approximately 09:30 (less than 18 hrs ago)  This patient has been administered Coagulation Factor Xa (Recombinat), andexanet alfa (ANDEXXA)  The patient was ordered and given a dose of 900 mg which was administered Peripherally (ICD-10-PCS Code: BM21115)  Pharmacy: [facility] This patient met criteria for:  [ X ] Peripheral administration ICD-10-PCS Code: ZM08022   _0  Dose ordered / given was 900 mg   _1  Dose ordered / given was 1800 mg     Luisa Hart, PharmD, BCPS Clinical Pharmacist 04/23/2022 20:22  Please refer to AMION for pharmacy phone number

## 2022-04-24 NOTE — Progress Notes (Signed)
Orthopedic Tech Progress Note Patient Details:  AMADEA KEAGY Dec 19, 1932 [id number]  Patient ID: [name], female   DOB: [birth year], 88 y.o.   MRN: [id number] I attended trauma page. Karolee Stamps 04/24/2022, 4:49 AM

## 2022-04-25 LAB — CBC
HCT: 28.6 % — ABNORMAL LOW (ref 36.0–46.0)
Hemoglobin: 9.4 g/dL — ABNORMAL LOW (ref 12.0–15.0)
MCH: 30.2 pg (ref 26.0–34.0)
MCHC: 32.9 g/dL (ref 30.0–36.0)
MCV: 92 fL (ref 80.0–100.0)
Platelets: 88 10*3/uL — ABNORMAL LOW (ref 150–400)
RBC: 3.11 MIL/uL — ABNORMAL LOW (ref 3.87–5.11)
RDW: 13.8 % (ref 11.5–15.5)
WBC: 15 10*3/uL — ABNORMAL HIGH (ref 4.0–10.5)
nRBC: 0 % (ref 0.0–0.2)

## 2022-04-25 LAB — GLUCOSE, CAPILLARY
Glucose-Capillary: 117 mg/dL — ABNORMAL HIGH (ref 70–99)
Glucose-Capillary: 128 mg/dL — ABNORMAL HIGH (ref 70–99)
Glucose-Capillary: 154 mg/dL — ABNORMAL HIGH (ref 70–99)

## 2022-04-25 LAB — BASIC METABOLIC PANEL
Anion gap: 8 (ref 5–15)
BUN: 24 mg/dL — ABNORMAL HIGH (ref 8–23)
CO2: 25 mmol/L (ref 22–32)
Calcium: 9.1 mg/dL (ref 8.9–10.3)
Chloride: 107 mmol/L (ref 98–111)
Creatinine, Ser: 0.72 mg/dL (ref 0.44–1.00)
GFR, Estimated: >60 mL/min (ref >60)
Glucose, Bld: 128 mg/dL — ABNORMAL HIGH (ref 70–99)
Potassium: 3.7 mmol/L (ref 3.5–5.1)
Sodium: 140 mmol/L (ref 135–145)

## 2022-04-25 NOTE — Progress Notes (Signed)
Patient ID: [name], female   DOB: December 21, 1932, 88 y.o.   MRN: [id number] Follow up - Trauma Critical Care   Patient Details:    Sarah [name] is an 88 y.o. female.  Lines/tubes : Implanted Port 04/23/19 Right Chest (Active)     External Urinary Catheter (Active)  Collection Container Dedicated Suction Canister 04/24/22 0100  Suction (Verified suction is between 40-80 mmHg) Yes 04/24/22 0100  Securement Method Mesh underwear 04/24/22 0100  Site Assessment Clean, Dry, Intact 04/24/22 0100  Output (mL) 200 mL 04/24/22 0600    Microbiology/Sepsis markers: Results for orders placed or performed during the hospital encounter of 04/23/22  MRSA Next Gen by PCR, Nasal     Status: None   Collection Time: 04/23/22 11:20 PM   Specimen: Nasal Mucosa; Nasal Swab  Result Value Ref Range Status   MRSA by PCR Next Gen NOT DETECTED NOT DETECTED Final    Comment: (NOTE) The GeneXpert MRSA Assay (FDA approved for NASAL specimens only), is one component of a comprehensive MRSA colonization surveillance program. It is not intended to diagnose MRSA infection nor to guide or monitor treatment for MRSA infections. Test performance is not FDA approved in patients less than 62 years old. Performed at [facility], [place] [address], [place], [place] [address]     Anti-infectives:  Anti-infectives (From admission, onward)    None      Subjective:    Overnight Issues:  NAEO. Up working with PT. C/o abd pain with movement. No BM yet. Denies nausea and states she wants breakfast. Objective:  Vital signs for last 24 hours: Temp:  [98 F (36.7 C)-99.8 F (37.7 C)] 98 F (36.7 C) (12/05 0727) Pulse Rate:  [70-109] 97 (12/05 0759) Resp:  [8-22] 18 (12/05 0600) BP: (130-170)/(70-112) 155/94 (12/05 0759) SpO2:  [90 %-100 %] 93 % (12/05 0600) Weight:  [50.9 kg] 50.9 kg (12/05 0500)  Hemodynamic parameters for last 24 hours:    Intake/Output from previous day: 12/04 0701 - 12/05  0700 In: 1614.8 [I.V.:1414.8; IV Piggyback:200] Out: 1050 [Urine:1050]  Intake/Output this shift: No intake/output data recorded.  Vent settings for last 24 hours:    Physical Exam:  General: alert and no respiratory distress Neuro: alert and F/C HEENT/Neck: L scalp lac intact with staples Resp: clear to auscultation bilaterally CVS: RRR GI: dressing with dry stain, soft Extremities: calves soft  LUE with some soft tissue swelling, hematoma over dorsum of hand; no IV on this side, per RN A-line was removed from LUE yesterday.  Results for orders placed or performed during the hospital encounter of 04/23/22 (from the past 24 hour(s))  Glucose, capillary     Status: Abnormal   Collection Time: 04/24/22 11:07 AM  Result Value Ref Range   Glucose-Capillary 138 (H) 70 - 99 mg/dL  Provider-confirm verbal Blood Bank order - RBC; 1 Unit; Order taken: 04/23/2022; 7:04 PM; Level 1 Trauma     Status: None   Collection Time: 04/24/22 11:40 AM  Result Value Ref Range   Blood product order confirm      MD AUTHORIZATION REQUESTED Performed at [facility], [place] [address], [place] [address]   CBC     Status: Abnormal   Collection Time: 04/24/22  2:39 PM  Result Value Ref Range   WBC 12.6 (H) 4.0 - 10.5 K/uL   RBC 3.31 (L) 3.87 - 5.11 MIL/uL   Hemoglobin 9.9 (L) 12.0 - 15.0 g/dL   HCT 29.6 (  L) 36.0 - 46.0 %   MCV 89.4 80.0 - 100.0 fL   MCH 29.9 26.0 - 34.0 pg   MCHC 33.4 30.0 - 36.0 g/dL   RDW 13.8 11.5 - 15.5 %   Platelets 82 (L) 150 - 400 K/uL   nRBC 0.0 0.0 - 0.2 %  Glucose, capillary     Status: Abnormal   Collection Time: 04/24/22  2:57 PM  Result Value Ref Range   Glucose-Capillary 122 (H) 70 - 99 mg/dL  Glucose, capillary     Status: Abnormal   Collection Time: 04/24/22  8:23 PM  Result Value Ref Range   Glucose-Capillary 137 (H) 70 - 99 mg/dL  Glucose, capillary     Status: Abnormal   Collection Time: 04/24/22 11:16 PM  Result Value Ref Range    Glucose-Capillary 115 (H) 70 - 99 mg/dL  Basic metabolic panel     Status: Abnormal   Collection Time: 04/25/22  2:46 AM  Result Value Ref Range   Sodium 140 135 - 145 mmol/L   Potassium 3.7 3.5 - 5.1 mmol/L   Chloride 107 98 - 111 mmol/L   CO2 25 22 - 32 mmol/L   Glucose, Bld 128 (H) 70 - 99 mg/dL   BUN 24 (H) 8 - 23 mg/dL   Creatinine, Ser 0.72 0.44 - 1.00 mg/dL   Calcium 9.1 8.9 - 10.3 mg/dL   GFR, Estimated >60 >60 mL/min   Anion gap 8 5 - 15  CBC     Status: Abnormal   Collection Time: 04/25/22  2:46 AM  Result Value Ref Range   WBC 15.0 (H) 4.0 - 10.5 K/uL   RBC 3.11 (L) 3.87 - 5.11 MIL/uL   Hemoglobin 9.4 (L) 12.0 - 15.0 g/dL   HCT 28.6 (L) 36.0 - 46.0 %   MCV 92.0 80.0 - 100.0 fL   MCH 30.2 26.0 - 34.0 pg   MCHC 32.9 30.0 - 36.0 g/dL   RDW 13.8 11.5 - 15.5 %   Platelets 88 (L) 150 - 400 K/uL   nRBC 0.0 0.0 - 0.2 %  Glucose, capillary     Status: Abnormal   Collection Time: 04/25/22  3:11 AM  Result Value Ref Range   Glucose-Capillary 117 (H) 70 - 99 mg/dL  Glucose, capillary     Status: Abnormal   Collection Time: 04/25/22  7:26 AM  Result Value Ref Range   Glucose-Capillary 128 (H) 70 - 99 mg/dL    Assessment & Plan: Present on Admission:  Ruptured spleen    LOS: 2 days   Additional comments:I reviewed the patient's new clinical lab test results. / GLF Grade 4 spleen lac with active extravasation - S/P splenectomy by Dr. Ninfa Linden 12/3, AROBF, plan vaccines prior to D/C Scalp lac - repaired in the OR Hx DVT/PE on Eliquis - reversed, duplex BLE negative for DVT; LUE swelling - plain films negative for FX. Elevate.  ABL anemia - hgb stablizing 10 > 9.9 > 9.4 this AM, per daughter pt was also undergoing outpatient workup of small amt rectal bleeding and was supposed to have a "virtual colonoscopy".  HTN - home meds Hyperglycemia - SSI VTE - PAS for now FEN - IVF, allow sips from floor, await further bowel function.  Dispo - to 4NP, PT/OT I spoke with her  daughter at the bedside  Obie Dredge, PA-C [facility] Use [email or web address] to contact on call provider  04/25/2022  *Care during the described time interval was provided by  me. I have reviewed this patient's available data, including medical history, events of note, physical examination and test results as part of my evaluation.

## 2022-04-25 NOTE — Evaluation (Signed)
Physical Therapy Evaluation Patient Details Name: Sarah [name] MRN: [id number] DOB: 1932/12/13 Today's Date: 04/25/2022  History of Present Illness  88 yo female admitted 12/3 after fall with spleen lac s/p splenectomy, head lac. PMhx; lymphoma (in remission), dementia, hypothyroidism, HLD, PE on Eliquis, second-degree AV block (s/p pacemaker), CVA, thrombocytopenia, and recurrent falls  Clinical Impression  Pt with flat affect, decreased orientation, memory and fear of falling. Pt needing max encouragement and reassurance to assist with mobility with education for benefit and need for mobility to maintain strength to return to PLOF. Pt normally lives alone and walks with RW. Pt currently max assist for bed level mobility and OOB to chair and will require ST-SNF at D/C unless able to progress considerably acutely. Pt with LUE weakness and edema not present at baseline with RN and PA aware. Encouraged OOB daily with nursing staff.   SPO2 91% on RA with return to 1L end of session at 93% HR 101       Recommendations for follow up therapy are one component of a multi-disciplinary discharge planning process, led by the attending physician.  Recommendations may be updated based on patient status, additional functional criteria and insurance authorization.  Follow Up Recommendations Skilled nursing-short term rehab (<3 hours/day) Can patient physically be transported by private vehicle: No    Assistance Recommended at Discharge Frequent or constant Supervision/Assistance  Patient can return home with the following  A lot of help with walking and/or transfers;A lot of help with bathing/dressing/bathroom;Assistance with cooking/housework;Direct supervision/assist for medications management;Assist for transportation;Direct supervision/assist for financial management    Equipment Recommendations None recommended by PT  Recommendations for Other Services       Functional Status Assessment  Patient has had a recent decline in their functional status and/or demonstrates limited ability to make significant improvements in function in a reasonable and predictable amount of time     Precautions / Restrictions Precautions Precautions: Fall Restrictions Weight Bearing Restrictions: No      Mobility  Bed Mobility Overal bed mobility: Needs Assistance Bed Mobility: Rolling, Sidelying to Sit Rolling: Max assist Sidelying to sit: Max assist       General bed mobility comments: max physical assist to roll to side and lift from side to sitting. Initial mod assist for sitting balance due to posterior lean, with progression to minguard with increased time    Transfers Overall transfer level: Needs assistance   Transfers: Sit to/from Stand, Bed to chair/wheelchair/BSC Sit to Stand: Mod assist Stand pivot transfers: Max assist         General transfer comment: mod assist with belt and knee blocked to rise from surface, max assist to pivot pt with short steps, narrow BOS and posterior lean    Ambulation/Gait               General Gait Details: unable  Stairs            Wheelchair Mobility    Modified Rankin (Stroke Patients Only)       Balance Overall balance assessment: History of Falls, Needs assistance Sitting-balance support: Feet supported, Bilateral upper extremity supported Sitting balance-Leahy Scale: Poor     Standing balance support: Bilateral upper extremity supported Standing balance-Leahy Scale: Zero Standing balance comment: max assist for standing balance                             Pertinent Vitals/Pain Pain Assessment Pain Assessment: 0-10 Pain Score:  5  Pain Location: abd Pain Descriptors / Indicators: Aching, Guarding, Sore Pain Intervention(s): Limited activity within patient's tolerance, Repositioned, Monitored during session    [place] expects to be discharged to:: Private  residence Living Arrangements: Alone Available Help at Discharge: Family;Friend(s);Available PRN/intermittently Type of Home: Independent living facility Home Access: Elevator       Home Layout: One level Home Equipment: Conservation officer, nature (2 wheels);Shower seat - built in;Grab bars - toilet;Grab bars - tub/shower Additional Comments: pt in ILF and on waiting list for AL[facility]    Prior Function Prior Level of Function : Needs assist             Mobility Comments: Using RW for ambulation ADLs Comments: Daughter assists with showers, daughter works and assists pt 3 days a week.     Hand Dominance        Extremity/Trunk Assessment   Upper Extremity Assessment Upper Extremity Assessment: Generalized weakness;LUE deficits/detail LUE Deficits / Details: LUE edema with grossly 2/5 strength with pt unable to lift fully against gravity and weak grip- all new since admission per dgtr    Lower Extremity Assessment Lower Extremity Assessment: Generalized weakness    Cervical / Trunk Assessment Cervical / Trunk Assessment: Kyphotic  Communication   Communication: HOH  Cognition Arousal/Alertness: Awake/alert Behavior During Therapy: Flat affect Overall Cognitive Status: Impaired/Different from baseline Area of Impairment: Orientation, Attention, Memory, Following commands, Safety/judgement                 Orientation Level: Disoriented to, Time Current Attention Level: Sustained Memory: Decreased short-term memory Following Commands: Follows one step commands inconsistently, Follows one step commands with increased time Safety/Judgement: Decreased awareness of safety, Decreased awareness of deficits     General Comments: pt benefits from max encouragement and reassurance        General Comments      Exercises     Assessment/Plan    PT Assessment Patient needs continued PT services  PT Problem List Decreased strength;Decreased mobility;Decreased  activity tolerance;Decreased coordination;Decreased safety awareness;Decreased cognition;Decreased balance;Decreased knowledge of use of DME;Pain       PT Treatment Interventions Gait training;Therapeutic exercise;Functional mobility training;Balance training;Patient/family education;Therapeutic activities;DME instruction;Cognitive remediation    PT Goals (Current goals can be found in the Care Plan section)  Acute Rehab PT Goals Patient Stated Goal: return to walking and back to ILF- per daughter PT Goal Formulation: With family Time For Goal Achievement: 05/09/22 Potential to Achieve Goals: Fair    Frequency Min 3X/week     Co-evaluation               AM-PAC PT "6 Clicks" Mobility  Outcome Measure Help needed turning from your back to your side while in a flat bed without using bedrails?: A Lot Help needed moving from lying on your back to sitting on the side of a flat bed without using bedrails?: A Lot Help needed moving to and from a bed to a chair (including a wheelchair)?: Total Help needed standing up from a chair using your arms (e.g., wheelchair or bedside chair)?: Total Help needed to walk in hospital room?: Total Help needed climbing 3-5 steps with a railing? : Total 6 Click Score: 8    End of Session Equipment Utilized During Treatment: Gait belt Activity Tolerance: Patient tolerated treatment well Patient left: in chair;with call bell/phone within reach;with family/visitor present;with nursing/sitter in room (chair alarm left off per daughter request while visiting) Nurse Communication: Mobility status PT Visit  Diagnosis: Other abnormalities of gait and mobility (R26.89);History of falling (Z91.81);Muscle weakness (generalized) (M62.81)    Time: 4037-0964 PT Time Calculation (min) (ACUTE ONLY): 26 min   Charges:   PT Evaluation $PT Eval Moderate Complexity: 1 Mod PT Treatments $Therapeutic Activity: 8-22 mins        Bayard Males, PT Acute Rehabilitation  Services Office: [phone]   Sandy Salaam Donn Zanetti 04/25/2022, 9:07 AM

## 2022-04-25 NOTE — Evaluation (Signed)
Occupational Therapy Evaluation Patient Details Name: Sarah [name] MRN: [id number] DOB: June 16, 1932 Today's Date: 04/25/2022   History of Present Illness 88 yo female admitted 12/3 after fall with spleen lac s/p splenectomy, head lac. PMhx; lymphoma (in remission), dementia, hypothyroidism, HLD, PE on Eliquis, second-degree AV block (s/p pacemaker), CVA, thrombocytopenia, and recurrent falls   Clinical Impression   PTA patient using RW for mobility and completing most ADLs/light IADLs with modified independence, daughter reports she assists with management of medications and transfers into/out of the shower.  Patient admitted for above and presents with problem list below including pain, impaired cognition, decreased activity tolerance, generalized weakness and decreased functional use of L UE due to edema, and impaired balance.  Patient disoriented to time and place initially, but improved after reorientation as able to recall place.  She follow simple commands but inconsistently, poor awareness to deficits and poor problem solving.  Patient requires min to total assist for Adls, total assist +2 to reposition in chair as pt resistant to movement and pushing posteriorly with attempts to reposition.  Encouraged daughter to engage pt in L UE hand pumps as tolerated, keep UE elevated. Based on performance today, pt will best benefit from continued OT services acutely and after dc at SNF level to decrease burden of care and optimize return to PLOF. Will follow.      Recommendations for follow up therapy are one component of a multi-disciplinary discharge planning process, led by the attending physician.  Recommendations may be updated based on patient status, additional functional criteria and insurance authorization.   Follow Up Recommendations  Skilled nursing-short term rehab (<3 hours/day)     Assistance Recommended at Discharge Frequent or constant Supervision/Assistance  Patient can return  home with the following Two people to help with walking and/or transfers;Two people to help with bathing/dressing/bathroom;Assistance with cooking/housework;Direct supervision/assist for medications management;Direct supervision/assist for financial management;Assist for transportation;Help with stairs or ramp for entrance    Functional Status Assessment  Patient has had a recent decline in their functional status and demonstrates the ability to make significant improvements in function in a reasonable and predictable amount of time.  Equipment Recommendations  Other (comment) (defer)    Recommendations for Other Services       Precautions / Restrictions Precautions Precautions: Fall Restrictions Weight Bearing Restrictions: No      Mobility Bed Mobility               General bed mobility comments: OOB in recliner    Transfers                          Balance                                           ADL either performed or assessed with clinical judgement   ADL Overall ADL's : Needs assistance/impaired     Grooming: Minimal assistance;Sitting Grooming Details (indicate cue type and reason): washing face and applying lotion         Upper Body Dressing : Maximal assistance;Sitting   Lower Body Dressing: +2 for physical assistance;+2 for safety/equipment;Total assistance;Sitting/lateral leans     Toilet Transfer Details (indicate cue type and reason): deferred, pt resistant to stand.  Posterior leaning with positioning in chair.         Functional mobility during ADLs: Total assistance;+2  for physical assistance;+2 for safety/equipment;Maximal assistance;Cueing for safety       Vision Baseline Vision/History: 1 Wears glasses Ability to See in Adequate Light: 0 Adequate Patient Visual Report: No change from baseline Additional Comments: pt able to open eyes and engage initally, fatigues easily and pt attempting to complete  ADLs with eyes closed.  Max cueing required, further assessment needed.     Perception     Praxis      Pertinent Vitals/Pain Pain Assessment Pain Assessment: Faces Faces Pain Scale: Hurts even more Pain Location: abd Pain Descriptors / Indicators: Aching, Guarding, Sore Pain Intervention(s): Limited activity within patient's tolerance, Monitored during session, Repositioned     Hand Dominance Right   Extremity/Trunk Assessment Upper Extremity Assessment Upper Extremity Assessment: Generalized weakness;LUE deficits/detail LUE Deficits / Details: L UE edema, grossly 2/5 MMT. PROM WFL to 90* FF AROM to 45* FF, hand grasp limited by edema. LUE: Shoulder pain with ROM LUE Sensation: WNL LUE Coordination: decreased fine motor;decreased gross motor   Lower Extremity Assessment Lower Extremity Assessment: Defer to PT evaluation   Cervical / Trunk Assessment Cervical / Trunk Assessment: Kyphotic   Communication Communication Communication: HOH   Cognition Arousal/Alertness: Lethargic Behavior During Therapy: Flat affect Overall Cognitive Status: Impaired/Different from baseline Area of Impairment: Orientation, Attention, Memory, Following commands, Safety/judgement, Awareness, Problem solving                 Orientation Level: Disoriented to, Time, Place Current Attention Level: Focused Memory: Decreased short-term memory Following Commands: Follows one step commands inconsistently, Follows one step commands with increased time Safety/Judgement: Decreased awareness of safety, Decreased awareness of deficits Awareness: Emergent Problem Solving: Slow processing, Decreased initiation, Requires verbal cues, Difficulty sequencing General Comments: pt reports 2003, disoriented to place voices "resturant" but able to recall after therapsit reoriented pt. Patient with decreased attention and problem sovling.  Daughter reports typically oriented.     General Comments  max-total  assist +2 to reposition in chair- pt with heavy L sided lean upon entry.  Limited by pain.    Exercises     Shoulder Instructions      Home Living Family/patient expects to be discharged to:: Private residence Living Arrangements: Alone Available Help at Discharge: Family;Friend(s);Available PRN/intermittently Type of Home: Independent living facility Home Access: Elevator     Home Layout: One level     Bathroom Shower/Tub: Hospital doctor Toilet: Handicapped height     Home Equipment: Conservation officer, nature (2 wheels);Shower seat - built in;Grab bars - toilet;Grab bars - tub/shower   Additional Comments: pt in ILF and on waiting list for ALF at [facility]      Prior Functioning/Environment Prior Level of Function : Needs assist             Mobility Comments: RW for mobility ADLs Comments: daughter assists with shower transfers but pt bathes herself, independent dressing and toileting; daughter assists with med mgmt and calls to remind pt to take medication. pt manages breakfast/lunch (simple) , goes to dining hall for dinner.        OT Problem List: Decreased strength;Decreased range of motion;Decreased activity tolerance;Impaired balance (sitting and/or standing);Decreased coordination;Decreased cognition;Decreased safety awareness;Decreased knowledge of use of DME or AE;Decreased knowledge of precautions;Impaired UE functional use;Pain;Increased edema      OT Treatment/Interventions: Self-care/ADL training;Therapeutic exercise;DME and/or AE instruction;Therapeutic activities;Cognitive remediation/compensation;Patient/family education;Balance training    OT Goals(Current goals can be found in the care plan section) Acute Rehab OT Goals Patient Stated Goal:  less pain OT Goal Formulation: With patient Time For Goal Achievement: 05/09/22 Potential to Achieve Goals: Fair  OT Frequency: Min 2X/week    Co-evaluation              AM-PAC OT "6 Clicks" Daily  Activity     Outcome Measure Help from another person eating meals?: A Little Help from another person taking care of personal grooming?: A Little Help from another person toileting, which includes using toliet, bedpan, or urinal?: Total Help from another person bathing (including washing, rinsing, drying)?: A Lot Help from another person to put on and taking off regular upper body clothing?: A Lot Help from another person to put on and taking off regular lower body clothing?: Total 6 Click Score: 12   End of Session Equipment Utilized During Treatment: Oxygen Nurse Communication: Mobility status  Activity Tolerance: Patient limited by pain Patient left: in chair;with call bell/phone within reach;with chair alarm set;with family/visitor present  OT Visit Diagnosis: Other abnormalities of gait and mobility (R26.89);History of falling (Z91.81);Muscle weakness (generalized) (M62.81);Pain Pain - part of body:  (abd)                Time: 6063-0160 OT Time Calculation (min): 32 min Charges:  OT General Charges $OT Visit: 1 Visit OT Evaluation $OT Eval Moderate Complexity: 1 Mod OT Treatments $Self Care/Home Management : 8-22 mins  Jolaine Artist, OT Acute Rehabilitation Services Office [phone]   Delight Stare 04/25/2022, 11:10 AM

## 2022-04-26 ENCOUNTER — Encounter (INDEPENDENT_AMBULATORY_CARE_PROVIDER_SITE_OTHER): Payer: Medicare Other | Admitting: Ophthalmology

## 2022-04-26 ENCOUNTER — Inpatient Hospital Stay (HOSPITAL_COMMUNITY): Payer: Medicare Other

## 2022-04-26 LAB — CBC
HCT: 27.4 % — ABNORMAL LOW (ref 36.0–46.0)
Hemoglobin: 8.9 g/dL — ABNORMAL LOW (ref 12.0–15.0)
MCH: 29.9 pg (ref 26.0–34.0)
MCHC: 32.5 g/dL (ref 30.0–36.0)
MCV: 91.9 fL (ref 80.0–100.0)
Platelets: 118 10*3/uL — ABNORMAL LOW (ref 150–400)
RBC: 2.98 MIL/uL — ABNORMAL LOW (ref 3.87–5.11)
RDW: 13.5 % (ref 11.5–15.5)
WBC: 17.2 10*3/uL — ABNORMAL HIGH (ref 4.0–10.5)
nRBC: 0.1 % (ref 0.0–0.2)

## 2022-04-26 LAB — BASIC METABOLIC PANEL
Anion gap: 9 (ref 5–15)
BUN: 21 mg/dL (ref 8–23)
CO2: 28 mmol/L (ref 22–32)
Calcium: 8.9 mg/dL (ref 8.9–10.3)
Chloride: 103 mmol/L (ref 98–111)
Creatinine, Ser: 0.74 mg/dL (ref 0.44–1.00)
GFR, Estimated: >60 mL/min (ref >60)
Glucose, Bld: 123 mg/dL — ABNORMAL HIGH (ref 70–99)
Potassium: 3.1 mmol/L — ABNORMAL LOW (ref 3.5–5.1)
Sodium: 140 mmol/L (ref 135–145)

## 2022-04-26 LAB — MAGNESIUM: Magnesium: 2.2 mg/dL (ref 1.7–2.4)

## 2022-04-26 LAB — GLUCOSE, CAPILLARY: Glucose-Capillary: 107 mg/dL — ABNORMAL HIGH (ref 70–99)

## 2022-04-26 MED ORDER — ACETAMINOPHEN 10 MG/ML IV SOLN
1000.0000 mg | Freq: Four times a day (QID) | INTRAVENOUS | Status: AC
  Administered 2022-04-26 – 2022-04-27 (×4): 1000 mg via INTRAVENOUS
  Filled 2022-04-26 (×5): qty 100

## 2022-04-26 MED ORDER — HALOPERIDOL LACTATE 5 MG/ML IJ SOLN
1.0000 mg | Freq: Four times a day (QID) | INTRAMUSCULAR | Status: DC | PRN
  Administered 2022-04-26: 1 mg via INTRAVENOUS
  Filled 2022-04-26: qty 1

## 2022-04-26 MED ORDER — POTASSIUM CHLORIDE 10 MEQ/100ML IV SOLN
10.0000 meq | INTRAVENOUS | Status: DC
  Filled 2022-04-26: qty 100

## 2022-04-26 MED ORDER — POTASSIUM CHLORIDE 10 MEQ/50ML IV SOLN
10.0000 meq | INTRAVENOUS | Status: AC
  Administered 2022-04-26 (×6): 10 meq via INTRAVENOUS
  Filled 2022-04-26 (×6): qty 50

## 2022-04-26 MED ORDER — POTASSIUM CHLORIDE 20 MEQ PO PACK
40.0000 meq | PACK | Freq: Once | ORAL | Status: AC
  Administered 2022-04-26: 40 meq via ORAL
  Filled 2022-04-26: qty 2

## 2022-04-26 MED ORDER — LIDOCAINE 5 % EX PTCH
1.0000 | MEDICATED_PATCH | CUTANEOUS | Status: DC
  Administered 2022-04-26 – 2022-05-06 (×11): 1 via TRANSDERMAL
  Filled 2022-04-26 (×11): qty 1

## 2022-04-26 MED ORDER — FUROSEMIDE 10 MG/ML IJ SOLN: 20.0000 mg | Freq: Once | INTRAMUSCULAR | Status: DC

## 2022-04-26 MED ORDER — POTASSIUM CHLORIDE 10 MEQ/100ML IV SOLN: 10.0000 meq | INTRAVENOUS | Status: DC

## 2022-04-26 NOTE — NC FL2 (Signed)
[place] LEVEL OF CARE FORM     IDENTIFICATION  Patient Name: Sarah [name] Birthdate: [birth year] Sex: female Admission Date (Current Location): 04/23/2022  [facility] and [place] Number:  Herbalist and Address:  [facility] [place], [place] [address], [place], [place] [address]      Provider Number: [id number]  Attending Physician Name and Address:  Md, Trauma, MD  Relative Name and Phone Number:       Current Level of Care: Hospital Recommended Level of Care: [place] Prior Approval Number:    Date Approved/Denied:   PASRR Number: Pending  Discharge Plan: SNF    Current Diagnoses: Patient Active Problem List   Diagnosis Date Noted   Status post surgery 04/23/2022   Ruptured spleen 04/23/2022   Clavi 12/27/2021   AV block 11/04/2021   Symptomatic bradycardia 11/02/2021   History of CVA (cerebrovascular accident) 11/02/2021   GERD (gastroesophageal reflux disease) 11/02/2021   Depression 11/02/2021   Pain in left tibia 11/01/2021   High Grade AV block 10/31/2021   Atrioventricular block, Mobitz type 1, Wenckebach 10/01/2021   History of pulmonary embolism 10/01/2021   Hypokalemia 10/01/2021   Thrombocytopenia ([place]) 10/01/2021   Cerebral thrombosis with cerebral infarction 05/16/2021   Headache 05/15/2021   Neuropathy 10/27/2020   Abnormal gait 06/22/2020   Age-related osteoporosis without current pathological fracture 06/22/2020   Anxiety 06/22/2020   Hardening of the aorta (main artery of the heart) ([place]) 06/22/2020   Malignant lymphoma, non-Hodgkin's (HCC) 06/22/2020   Mild cognitive disorder 06/22/2020   Non-pressure chronic ulcer of unspecified part of unspecified lower leg with unspecified severity ([place]) 06/22/2020   Insomnia 06/22/2020   Pulmonary nodule 06/22/2020   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   Abnormal chest x-ray 12/31/2019   Cough 12/31/2019   Port-A-Cath in place 06/04/2019    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[place]) 04/14/2019   Counseling regarding advance care planning and goals of care 04/14/2019   Lymphadenopathy of head and neck 03/17/2019   Lymph nodes enlarged 03/17/2019   History of pulmonary embolus (PE) 03/17/2019   Pulmonary embolism ([place]) 03/06/2019   Diffuse lymphadenopathy 03/06/2019   History of venous thromboembolism 02/2019   Pruritus 02/06/2019   Angio-edema 02/06/2019   Shortness of breath 02/06/2019   Osteopenia 12/18/2013   Heart block AV first degree 12/18/2013   Breast cancer of upper-inner quadrant of right female breast ([place]) 03/19/2012   Right knee DJD 08/18/2011   Hypothyroidism    Hypertension    PONV (postoperative nausea and vomiting)    Hyperlipemia     Orientation RESPIRATION BLADDER Height & Weight     Self, Situation, Place  Normal Incontinent, External catheter Weight: 112 lb 3.4 oz (50.9 kg) Height:     BEHAVIORAL SYMPTOMS/MOOD NEUROLOGICAL BOWEL NUTRITION STATUS      Continent Diet (See dc summary)  AMBULATORY STATUS COMMUNICATION OF NEEDS Skin   Extensive Assist Verbally Normal                       Personal Care Assistance Level of Assistance  Bathing, Feeding, Dressing Bathing Assistance: Maximum assistance Feeding assistance: Maximum assistance Dressing Assistance: Maximum assistance     Functional Limitations Info  Sight, Hearing, Speech Sight Info: Impaired Hearing Info: Adequate Speech Info: Adequate    SPECIAL CARE FACTORS FREQUENCY  PT (By licensed PT), OT (By licensed OT)     PT Frequency: 5xweek OT Frequency: 5xweek  Contractures Contractures Info: Not present    Additional Factors Info  Code Status, Allergies Code Status Info: Full Allergies Info: Alendronate Sodium  Amoxicillin-pot Clavulanate  Codeine  Lisinopril  Septra (Sulfamethoxazole-trimethoprim)  Thiazide-type Diuretics  Latex           Current Medications (04/26/2022):  This is the current hospital active  medication list Current Facility-Administered Medications  Medication Dose Route Frequency Provider Last Rate Last Admin   acetaminophen (OFIRMEV) IV 1,000 mg  1,000 mg Intravenous Q6H Jill Alexanders, PA-C 400 mL/hr at 04/26/22 1228 1,000 mg at 04/26/22 1228   Chlorhexidine Gluconate Cloth 2 % PADS 6 each  6 each Topical Daily Jesusita Oka, MD   6 each at 04/26/22 0944   citalopram (CELEXA) tablet 10 mg  10 mg Oral Daily Georganna Skeans, MD   10 mg at 04/25/22 0926   diphenhydrAMINE (BENADRYL) 12.5 MG/5ML elixir 12.5 mg  12.5 mg Oral Q6H PRN Coralie Keens, MD       Or   diphenhydrAMINE (BENADRYL) injection 12.5 mg  12.5 mg Intravenous Q6H PRN Coralie Keens, MD       [START ON 04/27/2022] furosemide (LASIX) injection 20 mg  20 mg Intravenous Once Simaan, Elizabeth S, PA-C       haloperidol lactate (HALDOL) injection 1 mg  1 mg Intravenous Q6H PRN Simaan, Elizabeth S, PA-C       lactated ringers infusion   Intravenous Continuous Jesusita Oka, MD   Stopped at 04/26/22 1211   levothyroxine (SYNTHROID) tablet 25 mcg  25 mcg Oral Q0600 Georganna Skeans, MD   25 mcg at 04/26/22 0506   lidocaine (LIDODERM) 5 % 1 patch  1 patch Transdermal Q24H Jill Alexanders, PA-C   1 patch at 04/26/22 0955   metoprolol tartrate (LOPRESSOR) injection 5 mg  5 mg Intravenous Q6H PRN Jesusita Oka, MD   5 mg at 04/24/22 0045   morphine (PF) 2 MG/ML injection 1-3 mg  1-3 mg Intravenous Q1H PRN Coralie Keens, MD   1 mg at 04/26/22 0307   ondansetron (ZOFRAN-ODT) disintegrating tablet 4 mg  4 mg Oral Q6H PRN Coralie Keens, MD       Or   ondansetron [facility]) injection 4 mg  4 mg Intravenous Q6H PRN Coralie Keens, MD   4 mg at 04/26/22 0816   Oral care mouth rinse  15 mL Mouth Rinse PRN Jesusita Oka, MD       pantoprazole (PROTONIX) injection 40 mg  40 mg Intravenous QHS Coralie Keens, MD   40 mg at 04/25/22 2213   potassium chloride 10 mEq in 50 mL *CENTRAL LINE* IVPB  10 mEq  Intravenous Q1 Hr x 6 Jill Alexanders, [place] 50 mL/hr at 04/26/22 1251 10 mEq at 04/26/22 1251   prochlorperazine (COMPAZINE) injection 10 mg  10 mg Intravenous Q6H PRN Georganna Skeans, MD         Discharge Medications: Please see discharge summary for a list of discharge medications.  Relevant Imaging Results:  Relevant Lab Results:   Additional Information SSN [place], [place]

## 2022-04-26 NOTE — TOC Initial Note (Signed)
Transition of Care [facility]) - Initial/Assessment Note    Patient Details  Name: Sarah [name] MRN: [id number] Date of Birth: 08-28-1932  Transition of [facility]) CM/SW Contact:    Ella Bodo, RN Phone Number: 04/26/2022, 2:58 PM  Clinical Narrative:                 88 yo female admitted 12/3 after fall with spleen lac s/p splenectomy, head lac. PMhx; lymphoma (in remission), dementia, hypothyroidism, HLD, PE on Eliquis, second-degree AV block (s/p pacemaker), CVA, thrombocytopenia, and recurrent falls.  Prior to admission, patient requires assistance with ADLs, and uses walker for mobility.  PT/OT recommending skilled nursing facility for rehab; left message for patient's daughter, [name] to discuss discharge planning.  Expected Discharge Plan: [place] Barriers to Discharge: Continued Medical Work up        Expected Discharge Plan and Services Expected Discharge Plan: [place]   Discharge Planning Services: CM Consult   Living arrangements for the past 2 months: Single Family Home                                      Prior Living Arrangements/Services Living arrangements for the past 2 months: Single Family Home Lives with:: Self Patient language and need for interpreter reviewed:: Yes        Need for Family Participation in Patient Care: Yes (Comment) Care giver support system in place?: Yes (comment)   Criminal Activity/Legal Involvement Pertinent to Current Situation/Hospitalization: No - Comment as needed                 Emotional Assessment              Admission diagnosis:  Fall, initial encounter [W19.XXXA] Status post surgery [Z98.890] Ruptured spleen [S36.09XA] Patient Active Problem List   Diagnosis Date Noted   Status post surgery 04/23/2022   Ruptured spleen 04/23/2022   Clavi 12/27/2021   AV block 11/04/2021   Symptomatic bradycardia 11/02/2021   History of CVA (cerebrovascular accident)  11/02/2021   GERD (gastroesophageal reflux disease) 11/02/2021   Depression 11/02/2021   Pain in left tibia 11/01/2021   High Grade AV block 10/31/2021   Atrioventricular block, Mobitz type 1, Wenckebach 10/01/2021   History of pulmonary embolism 10/01/2021   Hypokalemia 10/01/2021   Thrombocytopenia ([place]) 10/01/2021   Cerebral thrombosis with cerebral infarction 05/16/2021   Headache 05/15/2021   Neuropathy 10/27/2020   Abnormal gait 06/22/2020   Age-related osteoporosis without current pathological fracture 06/22/2020   Anxiety 06/22/2020   Hardening of the aorta (main artery of the heart) ([place]) 06/22/2020   Malignant lymphoma, non-Hodgkin's ([place]) 06/22/2020   Mild cognitive disorder 06/22/2020   Non-pressure chronic ulcer of unspecified part of unspecified lower leg with unspecified severity ([place]) 06/22/2020   Insomnia 06/22/2020   Pulmonary nodule 06/22/2020   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   Abnormal chest x-ray 12/31/2019   Cough 12/31/2019   Port-A-Cath in place 06/04/2019   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[place]) 04/14/2019   Counseling regarding advance care planning and goals of care 04/14/2019   Lymphadenopathy of head and neck 03/17/2019   Lymph nodes enlarged 03/17/2019   History of pulmonary embolus (PE) 03/17/2019   Pulmonary embolism ([place]) 03/06/2019   Diffuse lymphadenopathy 03/06/2019   History of venous thromboembolism 02/2019   Pruritus 02/06/2019   Angio-edema 02/06/2019   Shortness of breath 02/06/2019   Osteopenia 12/18/2013  Heart block AV first degree 12/18/2013   Breast cancer of upper-inner quadrant of right female breast ([place]) 03/19/2012   Right knee DJD 08/18/2011   Hypothyroidism    Hypertension    PONV (postoperative nausea and vomiting)    Hyperlipemia    PCP:  [place], [place]:   [facility] - [place] [address], [place] [address] Phone: [phone] Fax:  [phone]  [place], [place] [address] Phone: [phone] Fax: [phone]  [facility] [place], [place] [address] [place] [address] Phone: [phone] Fax: [phone]  [facility] [address] [place] [address] Phone: [phone] Fax: [phone]  [facility] [place], [place] [address] Phone: [phone] Fax: [phone]     Social Determinants of Health (SDOH) Interventions    Readmission Risk Interventions    11/04/2021   11:19 AM  Readmission Risk Prevention Plan  Transportation Screening Complete  HRI or [place] Complete  Social Work Consult for [place] Planning/Counseling Complete  Palliative Care Screening Not Applicable   Reinaldo Raddle, RN, BSN  Trauma/Neuro ICU Case Manager [phone]

## 2022-04-26 NOTE — Progress Notes (Addendum)
Patient ID: [name], female   DOB: 09/24/1932, 88 y.o.   MRN: [id number] Follow up - Trauma Critical Care   Patient Details:    Sarah [name] is an 88 y.o. female.  Lines/tubes : Implanted Port 04/23/19 Right Chest (Active)     External Urinary Catheter (Active)  Collection Container Dedicated Suction Canister 04/24/22 0100  Suction (Verified suction is between 40-80 mmHg) Yes 04/24/22 0100  Securement Method Mesh underwear 04/24/22 0100  Site Assessment Clean, Dry, Intact 04/24/22 0100  Output (mL) 200 mL 04/24/22 0600    Microbiology/Sepsis markers: Results for orders placed or performed during the hospital encounter of 04/23/22  MRSA Next Gen by PCR, Nasal     Status: None   Collection Time: 04/23/22 11:20 PM   Specimen: Nasal Mucosa; Nasal Swab  Result Value Ref Range Status   MRSA by PCR Next Gen NOT DETECTED NOT DETECTED Final    Comment: (NOTE) The GeneXpert MRSA Assay (FDA approved for NASAL specimens only), is one component of a comprehensive MRSA colonization surveillance program. It is not intended to diagnose MRSA infection nor to guide or monitor treatment for MRSA infections. Test performance is not FDA approved in patients less than 59 years old. Performed at [facility], [place] [address], [place], [place] [address]     Anti-infectives:  Anti-infectives (From admission, onward)    None      Subjective:    Overnight Issues:  Was thirsty and drank clears from the floor yesterday but this morning is c/o nausea and abdominal pain. Patients daughter feels her abdomen is more distended. She denies emesis. Daughter expresses concern about pts lack of mobility. States pain medication (morphine) knocked her out yesterday.  Objective:  Vital signs for last 24 hours: Temp:  [98.5 F (36.9 C)-99.1 F (37.3 C)] 98.9 F (37.2 C) (12/06 0730) Pulse Rate:  [66-93] 66 (12/06 0736) Resp:  [15-21] 19 (12/06 0700) BP: (109-163)/(55-94) 133/75 (12/06  0736) SpO2:  [89 %-97 %] 93 % (12/06 0700)  Hemodynamic parameters for last 24 hours:    Intake/Output from previous day: 12/05 0701 - 12/06 0700 In: 785.5 [I.V.:785.5] Out: 550 [Urine:550]  Intake/Output this shift: Total I/O In: 208 [P.O.:120; I.V.:88] Out: -   Vent settings for last 24 hours:    Physical Exam:  General: alert and no respiratory distress Neuro: alert and F/C HEENT/Neck: L scalp lac intact with staples Resp: clear to auscultation bilaterally CVS: RRR GI: dressing with dry stain, soft Extremities: calves soft  LUE with some soft tissue swelling, hematoma over dorsum of hand; no IV on this side (hx L IJ CVC and L radial A-line both removed POD#1) Results for orders placed or performed during the hospital encounter of 04/23/22 (from the past 24 hour(s))  Glucose, capillary     Status: Abnormal   Collection Time: 04/25/22 11:24 AM  Result Value Ref Range   Glucose-Capillary 154 (H) 70 - 99 mg/dL  CBC     Status: Abnormal   Collection Time: 04/26/22  3:21 AM  Result Value Ref Range   WBC 17.2 (H) 4.0 - 10.5 K/uL   RBC 2.98 (L) 3.87 - 5.11 MIL/uL   Hemoglobin 8.9 (L) 12.0 - 15.0 g/dL   HCT 27.4 (L) 36.0 - 46.0 %   MCV 91.9 80.0 - 100.0 fL   MCH 29.9 26.0 - 34.0 pg   MCHC 32.5 30.0 - 36.0 g/dL   RDW 13.5 11.5 - 15.5 %   Platelets 118 (  L) 150 - 400 K/uL   nRBC 0.1 0.0 - 0.2 %  Basic metabolic panel     Status: Abnormal   Collection Time: 04/26/22  3:21 AM  Result Value Ref Range   Sodium 140 135 - 145 mmol/L   Potassium 3.1 (L) 3.5 - 5.1 mmol/L   Chloride 103 98 - 111 mmol/L   CO2 28 22 - 32 mmol/L   Glucose, Bld 123 (H) 70 - 99 mg/dL   BUN 21 8 - 23 mg/dL   Creatinine, Ser 0.74 0.44 - 1.00 mg/dL   Calcium 8.9 8.9 - 10.3 mg/dL   GFR, Estimated >60 >60 mL/min   Anion gap 9 5 - 15  Magnesium     Status: None   Collection Time: 04/26/22  3:21 AM  Result Value Ref Range   Magnesium 2.2 1.7 - 2.4 mg/dL    Assessment & Plan: Present on Admission:   Ruptured spleen    LOS: 3 days   Additional comments:I reviewed the patient's new clinical lab test results. / GLF Grade 4 spleen lac with active extravasation - S/P splenectomy by Dr. Ninfa Linden 12/3, AROBF, plan vaccines prior to D/C Scalp lac - repaired in the OR, will need staples removed Hx DVT/PE on Eliquis - reversed, duplex BLE negative for DVT; LUE swelling - plain films negative for FX. Elevate.  ABL anemia - hgb stablizing 10 > 9.9 > 9.4 > 8.9 this AM, per daughter pt was also undergoing outpatient workup of small amt rectal bleeding and was supposed to have a virtual colonoscopy. HTN - hold PO meds, PRN IV metoprolol Hyperglycemia - SSI PMH lymphoma  PMH pacemaker placement   VTE - PAS for now, plts up today 118 from 88, consider DVT ppx starting tomorrow if hgb/plts remain stable FEN - IVF, place NGT to LIWS for ileus, K 3.1 - give 6 runs IV KCl, Mg is 2.2; will need to consider TPN if ileus persists  ID: no abx currently, WBC 17 and uptrending, but in the setting of splenectomy, afebrile, VSS, monitor   Dispo - to 4NP, PT/OT, NG to LIWS Add IV tylenol q 6h and lidoderm patch for non-narcotic pain control PT/OT currently recommend SNF, daughter does not want pt to go to SNF   I spoke with her daughter at the bedside  Obie Dredge, PA-C [facility] Use [email or web address] to contact on call provider  04/26/2022  *Care during the described time interval was provided by me. I have reviewed this patient's available data, including medical history, events of note, physical examination and test results as part of my evaluation.

## 2022-04-26 NOTE — Progress Notes (Signed)
RE: Sarah [name] Date of Birth: April 13, 2033 Date: 04/26/22  Please be advised that the above-named patient has a primary diagnosis of dementia which supersedes any psychiatric diagnosis. Patient will require a short-term nursing home stay - anticipated 30 days or less for rehabilitation and strengthening.  The plan is for return home.

## 2022-04-26 NOTE — Progress Notes (Signed)
Walked into patient's room with NGT out of L nare and lying on the bed. Pt seems more confused (A&O x 2) than earlier in my shift. Family is not at bedside at this time. Trauma PA notified. Waiting on response. New NGT reinserted into R nare with pending abd xray ordered.

## 2022-04-27 ENCOUNTER — Other Ambulatory Visit: Payer: Self-pay

## 2022-04-27 ENCOUNTER — Encounter (HOSPITAL_COMMUNITY): Payer: Self-pay | Admitting: Surgery

## 2022-04-27 LAB — TYPE AND SCREEN
ABO/RH(D): A POS
Antibody Screen: NEGATIVE
Unit division: 0
Unit division: 0

## 2022-04-27 LAB — BASIC METABOLIC PANEL
Anion gap: 8 (ref 5–15)
BUN: 31 mg/dL — ABNORMAL HIGH (ref 8–23)
CO2: 28 mmol/L (ref 22–32)
Calcium: 8.8 mg/dL — ABNORMAL LOW (ref 8.9–10.3)
Chloride: 105 mmol/L (ref 98–111)
Creatinine, Ser: 0.88 mg/dL (ref 0.44–1.00)
GFR, Estimated: >60 mL/min (ref >60)
Glucose, Bld: 104 mg/dL — ABNORMAL HIGH (ref 70–99)
Potassium: 3.7 mmol/L (ref 3.5–5.1)
Sodium: 141 mmol/L (ref 135–145)

## 2022-04-27 LAB — BPAM RBC
Blood Product Expiration Date: 202312162359
Blood Product Expiration Date: 202401072359
ISSUE DATE / TIME: 202312031904
Unit Type and Rh: 5100
Unit Type and Rh: 6200

## 2022-04-27 LAB — CBC
HCT: 26.8 % — ABNORMAL LOW (ref 36.0–46.0)
Hemoglobin: 8.4 g/dL — ABNORMAL LOW (ref 12.0–15.0)
MCH: 29.2 pg (ref 26.0–34.0)
MCHC: 31.3 g/dL (ref 30.0–36.0)
MCV: 93.1 fL (ref 80.0–100.0)
Platelets: 169 10*3/uL (ref 150–400)
RBC: 2.88 MIL/uL — ABNORMAL LOW (ref 3.87–5.11)
RDW: 13.3 % (ref 11.5–15.5)
WBC: 14.3 10*3/uL — ABNORMAL HIGH (ref 4.0–10.5)
nRBC: 0.4 % — ABNORMAL HIGH (ref 0.0–0.2)

## 2022-04-27 LAB — GLUCOSE, CAPILLARY: Glucose-Capillary: 111 mg/dL — ABNORMAL HIGH (ref 70–99)

## 2022-04-27 LAB — MAGNESIUM: Magnesium: 2.4 mg/dL (ref 1.7–2.4)

## 2022-04-27 MED ORDER — HALOPERIDOL LACTATE 5 MG/ML IJ SOLN: 2.0000 mg | Freq: Four times a day (QID) | INTRAMUSCULAR | Status: DC | PRN

## 2022-04-27 MED ORDER — MORPHINE SULFATE (PF) 2 MG/ML IV SOLN
1.0000 mg | INTRAVENOUS | Status: DC | PRN
  Administered 2022-05-03: 1 mg via INTRAVENOUS
  Filled 2022-04-27: qty 1

## 2022-04-27 MED ORDER — ENOXAPARIN SODIUM 30 MG/0.3ML IJ SOSY
30.0000 mg | PREFILLED_SYRINGE | Freq: Two times a day (BID) | INTRAMUSCULAR | Status: DC
  Administered 2022-04-27 – 2022-04-28 (×3): 30 mg via SUBCUTANEOUS
  Filled 2022-04-27 (×3): qty 0.3

## 2022-04-27 MED ORDER — METOPROLOL TARTRATE 5 MG/5ML IV SOLN
2.5000 mg | Freq: Three times a day (TID) | INTRAVENOUS | Status: DC
  Administered 2022-04-27 – 2022-05-01 (×12): 2.5 mg via INTRAVENOUS
  Filled 2022-04-27 (×12): qty 5

## 2022-04-27 MED ORDER — POTASSIUM CHLORIDE 10 MEQ/100ML IV SOLN
10.0000 meq | INTRAVENOUS | Status: AC
  Administered 2022-04-27: 10 meq via INTRAVENOUS
  Filled 2022-04-27: qty 100

## 2022-04-27 MED ORDER — ACETAMINOPHEN 10 MG/ML IV SOLN
1000.0000 mg | Freq: Four times a day (QID) | INTRAVENOUS | Status: AC
  Administered 2022-04-27 – 2022-04-28 (×4): 1000 mg via INTRAVENOUS
  Filled 2022-04-27 (×3): qty 100

## 2022-04-27 NOTE — Progress Notes (Signed)
Patient ID: [name], female   DOB: [birth year], 88 y.o.   MRN: [id number] Follow up - Trauma Critical Care   Patient Details:    Sarah [name] is an 88 y.o. female.  Lines/tubes : Implanted Port 04/23/19 Right Chest (Active)     External Urinary Catheter (Active)  Collection Container Dedicated Suction Canister 04/24/22 0100  Suction (Verified suction is between 40-80 mmHg) Yes 04/24/22 0100  Securement Method Mesh underwear 04/24/22 0100  Site Assessment Clean, Dry, Intact 04/24/22 0100  Output (mL) 200 mL 04/24/22 0600    Microbiology/Sepsis markers: Results for orders placed or performed during the hospital encounter of 04/23/22  MRSA Next Gen by PCR, Nasal     Status: None   Collection Time: 04/23/22 11:20 PM   Specimen: Nasal Mucosa; Nasal Swab  Result Value Ref Range Status   MRSA by PCR Next Gen NOT DETECTED NOT DETECTED Final    Comment: (NOTE) The GeneXpert MRSA Assay (FDA approved for NASAL specimens only), is one component of a comprehensive MRSA colonization surveillance program. It is not intended to diagnose MRSA infection nor to guide or monitor treatment for MRSA infections. Test performance is not FDA approved in patients less than 68 years old. Performed at [facility], [place] [address], [place], [place] [address]     Anti-infectives:  Anti-infectives (From admission, onward)    None      Subjective:    Overnight Issues:  Abdomen soft today. No bowel function yet. Patient is confused this morning, did not sleep much overnight. Daughter reports she takes Ambien at home.  Objective:  Vital signs for last 24 hours: Temp:  [98 F (36.7 C)-99.4 F (37.4 C)] 98.1 F (36.7 C) (12/07 0725) Pulse Rate:  [63-96] 63 (12/07 0725) Resp:  [16-24] 20 (12/07 0725) BP: (94-139)/(59-92) 139/70 (12/07 0725) SpO2:  [88 %-98 %] 92 % (12/07 0725) Weight:  [50.7 kg] 50.7 kg (12/06 2141)  Hemodynamic parameters for last 24 hours:     Intake/Output from previous day: 12/06 0701 - 12/07 0700 In: 1497.9 [P.O.:120; I.V.:678; IV Piggyback:700] Out: 960 [Urine:950; Emesis/NG output:10]  Intake/Output this shift: No intake/output data recorded.  Vent settings for last 24 hours:    Physical Exam:  General: alert and no respiratory distress Neuro: alert and disoriented HEENT/Neck: L scalp lac intact with staples Resp: normal work of breathing on room air CVS: RRR GI: honeycomb dressing in place over midline incision, abdomen soft, NG with bilious fluid Extremities: calves soft  Results for orders placed or performed during the hospital encounter of 04/23/22 (from the past 24 hour(s))  Glucose, capillary     Status: Abnormal   Collection Time: 04/26/22 10:42 PM  Result Value Ref Range   Glucose-Capillary 107 (H) 70 - 99 mg/dL  CBC     Status: Abnormal   Collection Time: 04/27/22  4:30 AM  Result Value Ref Range   WBC 14.3 (H) 4.0 - 10.5 K/uL   RBC 2.88 (L) 3.87 - 5.11 MIL/uL   Hemoglobin 8.4 (L) 12.0 - 15.0 g/dL   HCT 26.8 (L) 36.0 - 46.0 %   MCV 93.1 80.0 - 100.0 fL   MCH 29.2 26.0 - 34.0 pg   MCHC 31.3 30.0 - 36.0 g/dL   RDW 13.3 11.5 - 15.5 %   Platelets 169 150 - 400 K/uL   nRBC 0.4 (H) 0.0 - 0.2 %  Basic metabolic panel     Status: Abnormal   Collection Time: 04/27/22  4:30 AM  Result Value Ref Range   Sodium 141 135 - 145 mmol/L   Potassium 3.7 3.5 - 5.1 mmol/L   Chloride 105 98 - 111 mmol/L   CO2 28 22 - 32 mmol/L   Glucose, Bld 104 (H) 70 - 99 mg/dL   BUN 31 (H) 8 - 23 mg/dL   Creatinine, Ser 0.88 0.44 - 1.00 mg/dL   Calcium 8.8 (L) 8.9 - 10.3 mg/dL   GFR, Estimated >60 >60 mL/min   Anion gap 8 5 - 15    Assessment & Plan: Present on Admission:  Ruptured spleen    LOS: 4 days   Additional comments:I reviewed the patient's new clinical lab test results.   GLF Grade 4 spleen lac with active extravasation - S/P splenectomy by Dr. Ninfa Linden 12/3, now with ileus, continue NGT to LIS,  awaiting bowel function. Plan vaccines prior to D/C.  Scalp lac - repaired in the OR, will need staples removed on POD7 (12/10) Hx DVT/PE on Eliquis - reversed, duplex BLE negative for DVT; LUE swelling - plain films negative for FX. Elevate.  ABL anemia - hgb stablizing 10 > 9.9 > 9.4 > 8.9 > 8.4 this AM, per daughter pt was also undergoing outpatient workup of small amt rectal bleeding and was supposed to have a virtual colonoscopy. Home Eliquis on hold. HTN - hold PO meds, scheduled low-dose IV metoprolol (patient takes carvedilol at home). PMH lymphoma  PMH pacemaker placement  Delirium - Needs better sleep hygiene. Keep awake during day, reduce narcotic dose, benadryl discontinued. Prefer to avoid Ambien if possible.  VTE - Platelets up to 169 today, begin prophylactic lovenox. SCDs. FEN - IVF, continue NGT to LIS. Will need to start TPN if no return of bowel function in next 1-2 days. ID: no abx currently, leukocytosis improving and is likely secondary to asplenia. Afebrile.  Dispo - 4NP, PT/OT, NG to LIWS Add IV tylenol q 6h and lidoderm patch for non-narcotic pain control PT/OT currently recommend SNF, daughter does not want pt to go to SNF   I spoke with patient's daughter at bedside.  Michaelle Birks, MD [facility] General, Hepatobiliary and Pancreatic Surgery 04/27/22 9:39 AM   *Care during the described time interval was provided by me. I have reviewed this patient's available data, including medical history, events of note, physical examination and test results as part of my evaluation.

## 2022-04-27 NOTE — Care Management Important Message (Signed)
Important Message  Patient Details  Name: Sarah [name] MRN: [id number] Date of Birth: 1933-03-20   Medicare Important Message Given:  Yes     Hannah Beat 04/27/2022, 11:24 AM

## 2022-04-27 NOTE — Progress Notes (Signed)
Physical Therapy Treatment Patient Details Name: Sarah [name] MRN: [id number] DOB: [birth year] Today's Date: 04/27/2022   Impression: Pt tolerates treatment well with PT assisting in returning pt back to bed. Pt demonstrates improved initiation of anterior lean, with better sitting balance and reduced expression of fear of falling. Pt will benefit from continued aggressive mobilization in an effort to reduce falls risk and caregiver burden.  04/27/22 1151  PT Visit Information  Last PT Received On 04/27/22  Assistance Needed +2  History of Present Illness 88 yo female admitted 12/3 after fall with spleen lac s/p splenectomy, head lac. PMhx; lymphoma (in remission), dementia, hypothyroidism, HLD, PE on Eliquis, second-degree AV block (s/p pacemaker), CVA, thrombocytopenia, and recurrent falls  Subjective Data  Subjective pt reports she is in her living room, reoriented to place and situation by PT  Patient Stated Goal return to walking and back to ILF- per daughter  Precautions  Precautions Fall  Restrictions  Weight Bearing Restrictions No  Pain Assessment  Pain Assessment Faces  Faces Pain Scale 4  Pain Location abdomen  Pain Descriptors / Indicators Grimacing  Pain Intervention(s) Monitored during session  Cognition  Arousal/Alertness Awake/alert  Behavior During Therapy [facility] for tasks assessed/performed  Overall Cognitive Status Impaired/Different from baseline  Area of Impairment Orientation;Attention;Memory;Following commands;Safety/judgement;Awareness;Problem solving  Orientation Level Disoriented to;Place;Time;Situation  Current Attention Level Focused  Memory Decreased recall of precautions;Decreased short-term memory  Following Commands Follows one step commands with increased time  Safety/Judgement Decreased awareness of safety;Decreased awareness of deficits  Awareness Intellectual  Problem Solving Slow processing;Difficulty sequencing  Bed Mobility  Overal bed  mobility Needs Assistance  Bed Mobility Sit to Supine  Sit to supine Max assist  Transfers  Overall transfer level Needs assistance  Equipment used 1 person hand held assist  Transfers Bed to chair/wheelchair/BSC  Sit to Stand Max assist  Bed to/from chair/wheelchair/BSC transfer type: Stand pivot  Stand pivot transfers Max assist  General transfer comment PT provides knee block and BUE support, pt initiating anterior lean more effectively  Balance  Overall balance assessment Needs assistance  Sitting-balance support No upper extremity supported;Feet supported  Sitting balance-Leahy Scale Fair  Standing balance support Bilateral upper extremity supported;Reliant on assistive device for balance  Standing balance-Leahy Scale Poor  Standing balance comment maxA  General Comments  General comments (skin integrity, edema, etc.) VSS on RA  PT - End of Session  Activity Tolerance Patient tolerated treatment well  Patient left in bed;with call bell/phone within reach;with bed alarm set  Nurse Communication Mobility status;Need for lift equipment   PT - Assessment/Plan  PT Plan Current plan remains appropriate  PT Visit Diagnosis Other abnormalities of gait and mobility (R26.89);History of falling (Z91.81);Muscle weakness (generalized) (M62.81)  PT Frequency (ACUTE ONLY) Min 3X/week  Follow Up Recommendations Skilled nursing-short term rehab (<3 hours/day)  Can patient physically be transported by private vehicle No  Assistance recommended at discharge Frequent or constant Supervision/Assistance  Patient can return home with the following A lot of help with walking and/or transfers;A lot of help with bathing/dressing/bathroom;Assistance with cooking/housework;Direct supervision/assist for medications management;Assist for transportation;Direct supervision/assist for financial management  PT equipment None recommended by PT  AM-PAC PT "6 Clicks" Mobility Outcome Measure (Version 2)  Help  needed turning from your back to your side while in a flat bed without using bedrails? 2  Help needed moving from lying on your back to sitting on the side of a flat bed without using bedrails? 2  Help needed moving  to and from a bed to a chair (including a wheelchair)? 2  Help needed standing up from a chair using your arms (e.g., wheelchair or bedside chair)? 2  Help needed to walk in hospital room? 1  Help needed climbing 3-5 steps with a railing?  1  6 Click Score 10  Consider Recommendation of Discharge To: CIR/SNF/LTACH  Progressive Mobility  What is the highest level of mobility based on the progressive mobility assessment? Level 2 (Chairfast) - Balance while sitting on edge of bed and cannot stand  Mobility Referral No  Activity Transferred from chair to bed  PT Goal Progression  Progress towards PT goals Progressing toward goals  PT Time Calculation  PT Start Time (ACUTE ONLY) 1140  PT Stop Time (ACUTE ONLY) 1151  PT Time Calculation (min) (ACUTE ONLY) 11 min  PT General Charges  $$ ACUTE PT VISIT 1 Visit  PT Treatments  $Therapeutic Activity 8-22 mins                      Zenaida Niece, PT, DPT Acute Rehabilitation Office [phone]    Zenaida Niece 04/27/2022, 4:12 PM

## 2022-04-27 NOTE — Progress Notes (Signed)
Initial Nutrition Assessment  DOCUMENTATION CODES:   Severe malnutrition in context of chronic illness  INTERVENTION:   - Given severe malnutrition, recommend initiation of TPN as soon as feasible  NUTRITION DIAGNOSIS:   Severe Malnutrition related to chronic illness (dementia, angioimmunoblastic lymphoma) as evidenced by severe fat depletion, severe muscle depletion.  GOAL:   Patient will meet greater than or equal to 90% of their needs  MONITOR:   Diet advancement, Labs, Weight trends, Skin, I & O's, Other (TPN)  REASON FOR ASSESSMENT:   Malnutrition Screening Tool    ASSESSMENT:   88 year old female who presented to the ED on 12/03 after a mechanical fall. PMH of angioimmunoblastic lymphoma, dementia, HTN, HLD, arthritis, anxiety, DVT, breast cancer. Pt admitted with high grade splenic laceration.  12/03 - s/p ex lap, splenectomy, repair of 7 cm scalp laceration 12/06 - NGT to LIWS for ileus  Pt remains NPO except for sips of clears from the floor. A 14 Fr NG tube remains in place.  Spoke with pt's daughter at bedside. Pt's daughter expresses concern regarding pt's lack of nutrition during this admission. She states that pt is complaining of hunger and expressing a desire to eat. RD explained pathophysiology of post-op ileus. Pt's daughter shares that pt's abdomen is less distended compared to yesterday prior to NG tube insertion. Pt has still not had a BM this admission per daughter and per chart.  Pt's daughter shares that pt typically has a fair to good appetite at baseline. Pt usually eats 3 meals daily and drinks an Ensure Complete oral nutrition supplement a few times a week. For breakfast, pt typically consumes a small bowl of cereal, a banana, and juice. For lunch, pt occasionally eats out with her daughter and will have 1 large slice of pizza with a side salad. For dinner, pt will have whatever is served at her ALF.  Pt's daughter shares that pt's weight hs been  consistent lately around 110 lbs. She reports that pt did lose weight back in 2022 when she was receiving treatment for angioimmunoblastic lymphoma. Reviewed weight history in chart. Weight stable between 48-51 kg over the last 10 months.  Based on NFPE, pt meets criteria for severe malnutrition. Recommend initiation of TPN as soon as feasible. Discussed with attending MD who is in agreement with initiation of TPN.  Medications reviewed and include: IV protonix, IV acetaminophen IVF: LR @ 75 ml/hr  Labs reviewed: BUN 31, WBC 14.3, hemoglobin 8.4  UOP: 950 ml x 24 hours NGT: 10 ml x 24 hours I/O's: +2.5 L since admit  NUTRITION - FOCUSED PHYSICAL EXAM:  Flowsheet Row Most Recent Value  Orbital Region Severe depletion  Upper Arm Region Moderate depletion  Thoracic and Lumbar Region Severe depletion  Buccal Region Moderate depletion  Temple Region Severe depletion  Clavicle Bone Region Severe depletion  Clavicle and Acromion Bone Region Severe depletion  Scapular Bone Region Severe depletion  Dorsal Hand Moderate depletion  Patellar Region Severe depletion  Anterior Thigh Region Severe depletion  Posterior Calf Region Severe depletion  Edema (RD Assessment) Moderate  [LUE]  Hair Reviewed  Eyes Reviewed  Mouth Reviewed  Skin Reviewed  Nails Reviewed       Diet Order:   Diet Order             Diet NPO time specified Except for: Ice Chips  Diet effective now                   EDUCATION  NEEDS:   Education needs have been addressed  Skin:  Skin Assessment: Skin Integrity Issues: Incisions: abd, head  Last BM:  no documented BM  Height:   Ht Readings from Last 1 Encounters:  04/27/22 5\' 4"  (1.626 m)    Weight:   Wt Readings from Last 1 Encounters:  04/26/22 50.7 kg    Ideal Body Weight:  54.5 kg  BMI:  Body mass index is 19.19 kg/m.  Estimated Nutritional Needs:   Kcal:  1550-1750  Protein:  75-85 grams  Fluid:  1.5-1.7 L    Gustavus Bryant,  MS, RD, LDN Inpatient Clinical Dietitian Please see AMiON for contact information.

## 2022-04-27 NOTE — Progress Notes (Signed)
Physical Therapy Treatment Patient Details Name: AMELIE CARACCI MRN: [id number] DOB: 05/12/1933 Today's Date: 04/27/2022   History of Present Illness 88 yo female admitted 12/3 after fall with spleen lac s/p splenectomy, head lac. PMhx; lymphoma (in remission), dementia, hypothyroidism, HLD, PE on Eliquis, second-degree AV block (s/p pacemaker), CVA, thrombocytopenia, and recurrent falls    PT Comments    Pt tolerates treatment well despite confusion and anxiety. Pt with a great fear of falling, feels as if she is falling when lying still in bed. Pt continues to require significant assistance to mobilize due to weakness and strong posterior lean. PT utilizes visual external cues in an effort to reduce posterior lean and improve sitting balance. Pt will benefit from aggressive mobilization in an effort to improve activity tolerance and to reduce falls risk. PT continues to recommend SNF placement.   Recommendations for follow up therapy are one component of a multi-disciplinary discharge planning process, led by the attending physician.  Recommendations may be updated based on patient status, additional functional criteria and insurance authorization.  Follow Up Recommendations  Skilled nursing-short term rehab (<3 hours/day) Can patient physically be transported by private vehicle: No   Assistance Recommended at Discharge Frequent or constant Supervision/Assistance  Patient can return home with the following A lot of help with walking and/or transfers;A lot of help with bathing/dressing/bathroom;Assistance with cooking/housework;Direct supervision/assist for medications management;Assist for transportation;Direct supervision/assist for financial management   Equipment Recommendations  None recommended by PT    Recommendations for Other Services       Precautions / Restrictions Precautions Precautions: Fall Restrictions Weight Bearing Restrictions: No     Mobility  Bed  Mobility Overal bed mobility: Needs Assistance Bed Mobility: Rolling, Sidelying to Sit Rolling: Max assist Sidelying to sit: Max assist, HOB elevated            Transfers Overall transfer level: Needs assistance Equipment used: 1 person hand held assist Transfers: Sit to/from Stand, Bed to chair/wheelchair/BSC Sit to Stand: Max assist Stand pivot transfers: Max assist         General transfer comment: PT providing knee block to assist into standing, facilitation at trunk to pivot    Ambulation/Gait                   Stairs             Wheelchair Mobility    Modified Rankin (Stroke Patients Only)       Balance Overall balance assessment: Needs assistance Sitting-balance support: Single extremity supported, Feet supported Sitting balance-Leahy Scale: Poor Sitting balance - Comments: maxA progressing to minA Postural control: Posterior lean Standing balance support: Bilateral upper extremity supported Standing balance-Leahy Scale: Poor Standing balance comment: maxA                            Cognition Arousal/Alertness: Awake/alert Behavior During Therapy: Anxious Overall Cognitive Status: Impaired/Different from baseline Area of Impairment: Orientation, Attention, Memory, Following commands, Safety/judgement, Awareness, Problem solving                 Orientation Level: Disoriented to, Place, Time, Situation Current Attention Level: Focused Memory: Decreased recall of precautions, Decreased short-term memory Following Commands: Follows one step commands with increased time Safety/Judgement: Decreased awareness of safety, Decreased awareness of deficits Awareness: Intellectual Problem Solving: Slow processing, Requires verbal cues, Requires tactile cues          Exercises      General  Comments General comments (skin integrity, edema, etc.): VSS on RA      Pertinent Vitals/Pain Pain Assessment Pain Assessment:  Faces Faces Pain Scale: Hurts even more Pain Location: abdomen Pain Descriptors / Indicators: Grimacing Pain Intervention(s): Monitored during session    Home Living                          Prior Function            PT Goals (current goals can now be found in the care plan section) Acute Rehab PT Goals Patient Stated Goal: return to walking and back to ILF- per daughter Progress towards PT goals: Progressing toward goals    Frequency    Min 3X/week      PT Plan Current plan remains appropriate    Co-evaluation              AM-PAC PT "6 Clicks" Mobility   Outcome Measure  Help needed turning from your back to your side while in a flat bed without using bedrails?: A Lot Help needed moving from lying on your back to sitting on the side of a flat bed without using bedrails?: A Lot Help needed moving to and from a bed to a chair (including a wheelchair)?: A Lot Help needed standing up from a chair using your arms (e.g., wheelchair or bedside chair)?: Total Help needed to walk in hospital room?: Total Help needed climbing 3-5 steps with a railing? : Total 6 Click Score: 9    End of Session   Activity Tolerance: Patient tolerated treatment well Patient left: in chair;with call bell/phone within reach;with chair alarm set Nurse Communication: Mobility status;Need for lift equipment PT Visit Diagnosis: Other abnormalities of gait and mobility (R26.89);History of falling (Z91.81);Muscle weakness (generalized) (M62.81)     Time: 5974-1638 PT Time Calculation (min) (ACUTE ONLY): 32 min  Charges:  $Therapeutic Activity: 23-37 mins                     Zenaida Niece, PT, DPT Acute Rehabilitation Office [place] [place] Sumi Lye 04/27/2022, 9:49 AM

## 2022-04-27 NOTE — TOC Progression Note (Addendum)
Transition of Care [facility]) - Progression Note    Patient Details  Name: Sarah [name] MRN: [id number] Date of Birth: July 03, 1932  [facility] CM/SW Contact  Oren Section Cleta Alberts, RN Phone Number: 04/27/2022, 3:56 PM  Clinical Narrative:    Met with patient and daughter, [name] at bedside to discuss discharge planning.  [name] states that prior to admission, patient was living at [facility] independent [facility].  She states she has had her mother on the list at [facility] for assisted living facility for some time.  She is reluctantly agreeable to SNF at discharge, but would prefer SNF at [facility] with transition to assisted living facility there if possible.  Left message for Robb Matar in admissions at [facility] skilled nursing facility.  Will follow with updates as available.  Addendum: 4:11pm Spoke with Robb Matar at [facility] skilled nursing facility, regarding potential referral.  Admissions coordinator willing to accept referral; she is aware that patient is not medically stable for discharge at this time.  Will initiate FL-2 and send referral to [facility]; updated patient's daughter [name] on events.    Expected Discharge Plan: Skilled Nursing Facility Barriers to Discharge: Continued Medical Work up  Expected Discharge Plan and Services Expected Discharge Plan: [place]   Discharge Planning Services: CM Consult   Living arrangements for the past 2 months: Single Family Home                                       Social Determinants of Health (SDOH) Interventions    Readmission Risk Interventions    11/04/2021   11:19 AM  Readmission Risk Prevention Plan  Transportation Screening Complete  HRI or [place] Complete  Social Work Consult for [place] Planning/Counseling Complete  Palliative Care Screening Not Applicable   Reinaldo Raddle, RN, BSN  Trauma/Neuro ICU Case Manager [phone]

## 2022-04-27 NOTE — Progress Notes (Signed)
PHARMACY - TOTAL PARENTERAL NUTRITION CONSULT NOTE   Indication: Prolonged ileus  Patient Measurements: Height: 5\' 4"  (162.6 cm) Weight: 50.7 kg (111 lb 12.4 oz) IBW/kg (Calculated) : 54.7   Body mass index is 19.19 kg/m. Usual Weight: 50 kg  Assessment: 88 yo female presented on 04/23/22 as trauma after mechanical fall. Patient is s/p exlap and splenectomy. Pharmacy consulted for TPN initiation for prolonged ileus in setting of severe malnutrition.   Glucose / Insulin: cbgs controlled with no SSI. A1c last 04/2021 was 5.2.  Electrolytes: Na 141, K 3.7.No Mg.  Other electrolytes wnl.  Renal: Scr at baseline. BUN 31.  Hepatic: LFTs / Tbili wnl on presentation.  Intake / Output; MIVF: LR @75  ml/hr. NGT 10 ml. UOP 0.8 ml/kg/hr + 1 unmeasured occurrence.  GI Imaging: GI Surgeries / Procedures:   Central access: order for PICC to be placed 12/8 per surgery pending discussion with daughter  TPN start date: 12/8  Nutritional Goals:  RD Assessment: Estimated Needs Total Energy Estimated Needs: 1550-1750 Total Protein Estimated Needs: 75-85 grams Total Fluid Estimated Needs: 1.5-1.7 L  Current Nutrition:  NPO   Plan:  TPN to start on 12/8  KCl 10 mEq x3 for goal > 4 Add on Mg and supplement for goal >2 TPN labs ordered for tomorrow   Cristela Felt, PharmD, BCPS Clinical Pharmacist 04/27/2022 3:02 PM

## 2022-04-28 ENCOUNTER — Inpatient Hospital Stay (HOSPITAL_COMMUNITY): Payer: Medicare Other

## 2022-04-28 DIAGNOSIS — M7989 Other specified soft tissue disorders: Secondary | ICD-10-CM

## 2022-04-28 DIAGNOSIS — Z86718 Personal history of other venous thrombosis and embolism: Secondary | ICD-10-CM | POA: Diagnosis not present

## 2022-04-28 LAB — MAGNESIUM: Magnesium: 2.2 mg/dL (ref 1.7–2.4)

## 2022-04-28 LAB — COMPREHENSIVE METABOLIC PANEL
ALT: 21 U/L (ref 0–44)
AST: 35 U/L (ref 15–41)
Albumin: 2.8 g/dL — ABNORMAL LOW (ref 3.5–5.0)
Alkaline Phosphatase: 59 U/L (ref 38–126)
Anion gap: 12 (ref 5–15)
BUN: 27 mg/dL — ABNORMAL HIGH (ref 8–23)
CO2: 25 mmol/L (ref 22–32)
Calcium: 8.7 mg/dL — ABNORMAL LOW (ref 8.9–10.3)
Chloride: 107 mmol/L (ref 98–111)
Creatinine, Ser: 0.9 mg/dL (ref 0.44–1.00)
GFR, Estimated: >60 mL/min (ref >60)
Glucose, Bld: 96 mg/dL (ref 70–99)
Potassium: 3.6 mmol/L (ref 3.5–5.1)
Sodium: 144 mmol/L (ref 135–145)
Total Bilirubin: 1.1 mg/dL (ref 0.3–1.2)
Total Protein: 5.4 g/dL — ABNORMAL LOW (ref 6.5–8.1)

## 2022-04-28 LAB — PHOSPHORUS: Phosphorus: 3.3 mg/dL (ref 2.5–4.6)

## 2022-04-28 LAB — HEPARIN LEVEL (UNFRACTIONATED): Heparin Unfractionated: 0.57 [IU]/mL (ref 0.30–0.70)

## 2022-04-28 LAB — APTT: aPTT: 31 s (ref 24–36)

## 2022-04-28 LAB — GLUCOSE, CAPILLARY: Glucose-Capillary: 105 mg/dL — ABNORMAL HIGH (ref 70–99)

## 2022-04-28 LAB — TRIGLYCERIDES: Triglycerides: 120 mg/dL (ref <150)

## 2022-04-28 MED ORDER — ZOLPIDEM TARTRATE 5 MG PO TABS
5.0000 mg | ORAL_TABLET | Freq: Every day | ORAL | Status: DC
  Administered 2022-04-28 – 2022-05-05 (×7): 5 mg via ORAL
  Filled 2022-04-28 (×7): qty 1

## 2022-04-28 MED ORDER — HEPARIN (PORCINE) 25000 UT/250ML-% IV SOLN
1050.0000 [IU]/h | INTRAVENOUS | Status: DC
  Administered 2022-04-28: 850 [IU]/h via INTRAVENOUS
  Administered 2022-04-29 – 2022-05-02 (×3): 900 [IU]/h via INTRAVENOUS
  Administered 2022-05-03 – 2022-05-04 (×2): 1000 [IU]/h via INTRAVENOUS
  Filled 2022-04-28 (×7): qty 250

## 2022-04-28 MED ORDER — ACETAMINOPHEN 10 MG/ML IV SOLN
1000.0000 mg | Freq: Four times a day (QID) | INTRAVENOUS | Status: DC
  Administered 2022-04-28 – 2022-04-29 (×3): 1000 mg via INTRAVENOUS
  Filled 2022-04-28 (×3): qty 100

## 2022-04-28 NOTE — Progress Notes (Addendum)
ANTICOAGULATION CONSULT NOTE  Pharmacy Consult for heparin Indication:  new LUE DVT  Heparin Dosing Weight: 50.7 kg  Labs: Recent Labs    04/26/22 0321 04/27/22 0430 04/28/22 0518  HGB 8.9* 8.4*  --   HCT 27.4* 26.8*  --   PLT 118* 169  --   CREATININE 0.74 0.88 0.90    Assessment: 29 yof with hx of lymphoma, DVT/PE on apixaban PTA presenting with ruptured spleen, s/p splenectomy 12/3, now with ileus post-op. Apixaban was reversed on admission prior to procedure with Andexxa. Patient found to have new LUE DVT. Pharmacy consulted to start heparin with no bolus. Last dose of apixaban was 12/3 AM PTA and currently on Lovenox 30mg  [place] q12h inpatient, last dose 12/8 AM. Hg low stable 8.4, plt up to 169 (last CBC on 12/7). No bleed issues reported.  Goal of Therapy:  Heparin level 0.3-0.7 units/ml Monitor platelets by anticoagulation protocol: Yes   Plan:  D/c Lovenox No bolus. Start heparin at 850 units/hr Check 6hr heparin level/aPTT - likely apixaban no longer influencing heparin level based on last dose but will confirm Monitor daily heparin level/aPTT until correlating, CBC, s/sx bleeding   Arturo Morton, PharmD, BCPS Clinical Pharmacist 04/28/2022 2:32 PM

## 2022-04-28 NOTE — Progress Notes (Incomplete)
PHARMACY - TOTAL PARENTERAL NUTRITION CONSULT NOTE   Indication: Prolonged ileus  Patient Measurements: Height: 5\' 4"  (162.6 cm) Weight: 50.7 kg (111 lb 12.4 oz) IBW/kg (Calculated) : 54.7   Body mass index is 19.19 kg/m. Usual Weight: 50 kg  Assessment: 88 yo female presented on 04/23/22 as trauma after mechanical fall. Patient is s/p exlap and splenectomy on 12/3. Pharmacy consulted for TPN initiation for prolonged ileus in setting of severe malnutrition.   Glucose / Insulin: No hx DM (last A1c 5.2% from 04/2021) Cbgs controlled on low end of range with no meds prior to TPN start Electrolytes: K 3.6 stable (s/p KCl 64mEq IV yesterday; goal >/=4 with ileus). Other electrolytes wnl Renal: Scr 0.9 stable. BUN down to 27 Hepatic: LFTs / Tbili / TG wnl, albumin 2.8 Intake / Output; MIVF: MIVF: LR 75 ml/hr. NGT 150 ml. UOP 0.5 ml/kg/hr per documentation GI Imaging: 12/3 CT abd/pelvis - severe splenic lac, active extravasation of contrast and large hemoperitoneum, small R pleural effusion, cardiomegaly, CAD, aortic atherosclerosis GI Surgeries / Procedures: 12/3 ex-lap, splenectomy, scalp laceration repair  Central access: order for PICC to be placed 12/8 per surgery pending discussion with daughter  TPN start date: 12/8  Nutritional Goals: Goal TPN rate is 60 mL/hr (provides 76 g of protein and 1557 kcals per day)  RD Assessment: Estimated Needs Total Energy Estimated Needs: 1550-1750 Total Protein Estimated Needs: 75-85 grams Total Fluid Estimated Needs: 1.5-1.7 L  Current Nutrition:  NPO   Plan:  Start TPN at 30 mL/hr at 1800. Titrate to goal as appropriate. Monitor closely for refeeding. TPN will provide 38g protein and 778 kCal, meeting 50% of patient needs. Electrolytes in TPN: Na 69mEq/L, K 44mEq/L, Ca 16mEq/L, Mg 73mEq/L, and Phos 62mmol/L. Cl:Ac 1:1 Add standard MVI and trace elements to TPN Initiate Sensitive q6h SSI and adjust as needed  Reduce MIVF to 45 mL/hr at 1800  when TPN bag hung Monitor TPN labs daily until stable, then standard on Mon/Thurs F/u for resolution of ileus, ability to tolerate PO vs. TF and wean TPN   Arturo Morton, PharmD, BCPS Please check AMION for all [place] contact numbers Clinical Pharmacist 04/28/2022 8:01 AM

## 2022-04-28 NOTE — Progress Notes (Signed)
Attempted to start PIV for hep gtt. Patient's daughter request for expert (IV team) d/t pt gotten stuck several times previously. IV team consulted.

## 2022-04-28 NOTE — TOC Progression Note (Addendum)
Transition of Care [facility]) - Progression Note    Patient Details  Name: Sarah [name] MRN: [id number] Date of Birth: Nov 03, 1932  Transition of Care [facility]) CM/SW Contact  Ella Bodo, RN Phone Number: 04/28/2022, 1:50 PM  Clinical Narrative:    Noted patient starting on TPN, and still has NG.  Faxed SNF referral to [facility] for review.  Aware that patient is not medically stable for SNF; will continue to follow as patient progresses.  Addendum: 2:12pm Faxed H&P, progress notes, FL2 and 30 day note to [facility] in an attempt to receive PASSR number.    Expected Discharge Plan: Skilled Nursing Facility Barriers to Discharge: Continued Medical Work up  Expected Discharge Plan and Services Expected Discharge Plan: [place]   Discharge Planning Services: CM Consult   Living arrangements for the past 2 months: Single Family Home                                       Social Determinants of Health (SDOH) Interventions    Readmission Risk Interventions    11/04/2021   11:19 AM  Readmission Risk Prevention Plan  Transportation Screening Complete  HRI or [place] Complete  Social Work Consult for [place] Planning/Counseling Complete  Palliative Care Screening Not Applicable   Reinaldo Raddle, RN, BSN  Trauma/Neuro ICU Case Manager [phone]

## 2022-04-28 NOTE — Progress Notes (Addendum)
PHARMACY - TOTAL PARENTERAL NUTRITION CONSULT NOTE   Indication: Prolonged ileus  Patient Measurements: Height: 5\' 4"  (162.6 cm) Weight: 50.7 kg (111 lb 12.4 oz) IBW/kg (Calculated) : 54.7   Body mass index is 19.19 kg/m.  Assessment: 88 yo female presented on 04/23/22 as trauma after mechanical fall. Patient is s/p exlap and splenectomy on 12/3. Pharmacy consulted for TPN initiation for prolonged ileus in setting of severe malnutrition.    Glucose / Insulin: No hx DM (last A1c 5.2% from 04/2021) Cbgs controlled on low end of range with no meds prior to TPN start Electrolytes: K 3.6 stable (s/p KCl 47mEq IV yesterday; goal >/=4 with ileus). Other electrolytes wnl Renal: Scr 0.9 stable. BUN down to 27 Hepatic: LFTs / Tbili / TG wnl, albumin 2.8 Intake / Output; MIVF: MIVF: LR 75 ml/hr. NGT 150 ml. UOP 0.5 ml/kg/hr per documentation GI Imaging: 12/3 CT abd/pelvis - severe splenic lac, active extravasation of contrast and large hemoperitoneum, small R pleural effusion, cardiomegaly, CAD, aortic atherosclerosis GI Surgeries / Procedures: 12/3 ex-lap, splenectomy, scalp laceration repair   Central access: PICC placement delayed 12/8 per Trauma team, awaiting doppler prior to PICC order due to swollen LUE and RUE off limits due to port TPN start date: 12/9   Nutritional Goals: Goal TPN rate is 60 mL/hr (provides 76 g of protein and 1557 kcals per day)  RD Assessment: Estimated Needs Total Energy Estimated Needs: 1550-1750 Total Protein Estimated Needs: 75-85 grams Total Fluid Estimated Needs: 1.5-1.7 L  Current Nutrition:  NPO   Plan:  Per discussion with Trauma team, will plan to delay TPN start to 12/9 while sorting out access issues Continue LR at 75 ml/hr per MD Monitor TPN labs ordered for tomorrow F/u for resolution of ileus, ability to tolerate PO vs. TF and wean TPN    Arturo Morton, PharmD, BCPS Please check AMION for all [facility] contact numbers Clinical  Pharmacist 04/28/2022 11:37 AM

## 2022-04-28 NOTE — Progress Notes (Signed)
Upper extremity venous left study completed.  Preliminary results relayed to [place], [place] via secure chat. Also attempted to call office number provided in Epic with callback number given.   See CV Proc for preliminary results report.   Darlin Coco, RDMS, RVT

## 2022-04-28 NOTE — Progress Notes (Signed)
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 Apixaban was reversed on admission prior to procedure with Andexxa. Patient found to have new LUE DVT. Pharmacy consulted to start heparin with no bolus. Last dose of apixaban was 12/3 AM PTA and currently on Lovenox 30mg  [place] q12h inpatient, last dose 12/8 AM. Hg low stable 8.4, plt up to 169 (last CBC on 12/7).   Heparin level 0.57 but aptt low at 31s. Not clear if apixaban is still hanging around but given that aptt is normal will dose adjust based off of the aptt. No bleeding issues noted.   Goal of Therapy:  Heparin level 0.3-0.7 units/ml Monitor platelets by anticoagulation protocol: Yes   Plan:  No bolus. Increase heparin to 950 units/hr Check 8hr heparin level/aPTT influencing heparin level based on last dose but will confirm Monitor daily heparin level/aPTT until correlating, CBC, s/sx bleeding  Erin Hearing PharmD., BCPS Clinical Pharmacist 04/28/2022 11:51 PM

## 2022-04-28 NOTE — Progress Notes (Addendum)
Patient ID: Sarah [name], female   DOB: 29-May-1932, 88 y.o.   MRN: [id number] Follow up - Trauma Critical Care   Patient Details:    Sarah [name] is an 88 y.o. female.  Lines/tubes : Implanted Port 04/23/19 Right Chest (Active)     External Urinary Catheter (Active)  Collection Container Dedicated Suction Canister 04/24/22 0100  Suction (Verified suction is between 40-80 mmHg) Yes 04/24/22 0100  Securement Method Mesh underwear 04/24/22 0100  Site Assessment Clean, Dry, Intact 04/24/22 0100  Output (mL) 200 mL 04/24/22 0600    Microbiology/Sepsis markers: Results for orders placed or performed during the hospital encounter of 04/23/22  MRSA Next Gen by PCR, Nasal     Status: None   Collection Time: 04/23/22 11:20 PM   Specimen: Nasal Mucosa; Nasal Swab  Result Value Ref Range Status   MRSA by PCR Next Gen NOT DETECTED NOT DETECTED Final    Comment: (NOTE) The GeneXpert MRSA Assay (FDA approved for NASAL specimens only), is one component of a comprehensive MRSA colonization surveillance program. It is not intended to diagnose MRSA infection nor to guide or monitor treatment for MRSA infections. Test performance is not FDA approved in patients less than 56 years old. Performed at [facility], [place] [address] [place], [place] [address]     Anti-infectives:  Anti-infectives (From admission, onward)    None      Subjective:    Overnight Issues:  Abdomen soft today. No bowel function yet. Patient is confused this morning, did not sleep much overnight. Daughter reports she takes Ambien at home.  Objective:  Vital signs for last 24 hours: Temp:  [97.2 F (36.2 C)-98.8 F (37.1 C)] 98.3 F (36.8 C) (12/08 0802) Pulse Rate:  [59-79] 59 (12/08 0802) Resp:  [16-19] 16 (12/08 0802) BP: (129-150)/(65-121) 150/91 (12/08 0802) SpO2:  [92 %-98 %] 98 % (12/08 0802)  Hemodynamic parameters for last 24 hours:    Intake/Output from previous day: 12/07 0701 -  12/08 0700 In: 700 [I.V.:600; IV Piggyback:100] Out: 800 [Urine:650; Emesis/NG output:150]  Intake/Output this shift: Total I/O In: 120 [P.O.:120] Out: 200 [Emesis/NG output:200]  Vent settings for last 24 hours:    Physical Exam:  General: alert and no respiratory distress Neuro: alert and disoriented HEENT/Neck: L scalp lac intact with staples Resp: normal work of breathing on room air CVS: RRR GI: honeycomb dressing in place over midline incision, abdomen soft, NG with bilious fluid Extremities: calves soft  Results for orders placed or performed during the hospital encounter of 04/23/22 (from the past 24 hour(s))  Glucose, capillary     Status: Abnormal   Collection Time: 04/27/22  9:50 PM  Result Value Ref Range   Glucose-Capillary 111 (H) 70 - 99 mg/dL  Comprehensive metabolic panel     Status: Abnormal   Collection Time: 04/28/22  5:18 AM  Result Value Ref Range   Sodium 144 135 - 145 mmol/L   Potassium 3.6 3.5 - 5.1 mmol/L   Chloride 107 98 - 111 mmol/L   CO2 25 22 - 32 mmol/L   Glucose, Bld 96 70 - 99 mg/dL   BUN 27 (H) 8 - 23 mg/dL   Creatinine, Ser 0.90 0.44 - 1.00 mg/dL   Calcium 8.7 (L) 8.9 - 10.3 mg/dL   Total Protein 5.4 (L) 6.5 - 8.1 g/dL   Albumin 2.8 (L) 3.5 - 5.0 g/dL   AST 35 15 - 41 U/L   ALT 21 0 - 44  U/L   Alkaline Phosphatase 59 38 - 126 U/L   Total Bilirubin 1.1 0.3 - 1.2 mg/dL   GFR, Estimated >60 >60 mL/min   Anion gap 12 5 - 15  Magnesium     Status: None   Collection Time: 04/28/22  5:18 AM  Result Value Ref Range   Magnesium 2.2 1.7 - 2.4 mg/dL  Phosphorus     Status: None   Collection Time: 04/28/22  5:18 AM  Result Value Ref Range   Phosphorus 3.3 2.5 - 4.6 mg/dL  Triglycerides     Status: None   Collection Time: 04/28/22  5:18 AM  Result Value Ref Range   Triglycerides 120 <150 mg/dL    Assessment & Plan: Present on Admission:  Ruptured spleen    LOS: 5 days   Additional comments:I reviewed the patient's new clinical  lab test results.   GLF Grade 4 spleen lac with active extravasation - S/P splenectomy by Dr. Ninfa Linden 12/3, now with ileus, continue NGT to LIS, awaiting bowel function. Plan vaccines prior to D/C.  Scalp lac - repaired in the OR, will need staples removed on POD7 (12/10) Hx DVT/PE on Eliquis - reversed, duplex BLE negative for DVT; LUE swelling - plain films negative for FX. Elevate.  ABL anemia - hgb stablizing 10 > 9.9 > 9.4 > 8.9 > 8.4 yesterday AM, per daughter pt was also undergoing outpatient workup of small amt rectal bleeding and was supposed to have a virtual colonoscopy. Home Eliquis on hold. HTN - hold PO meds, scheduled low-dose IV metoprolol (patient takes carvedilol at home). And PRN doses  PMH lymphoma  PMH pacemaker placement  Delirium - Needs better sleep hygiene. Keep awake during day, reduce narcotic dose, benadryl discontinued. Allow bedtime ambien starting tonight - pt anxious and not sleeping well.   VTE - lovenox started 12/7, monitor plts, SCDs. FEN - IVF, continue NGT to LIS. Plan to start TPN today - will need PICC line, RUE off limits due to port, LUE swollen - doppler prior to PICC order. ID: no abx currently, leukocytosis improving and is likely secondary to asplenia. Afebrile.  Dispo - 4NP, PT/OT, NG to LIWS, Doppler LUE, start TPN May need repeat CT over the weekend for persistent ileus  PT/OT currently recommend SNF, daughter does not want pt to go to SNF   I spoke with patient's daughter at bedside.  Obie Dredge, [facility] Surgery General, Hepatobiliary and Pancreatic Surgery 04/28/22 10:06 AM   *Care during the described time interval was provided by me. I have reviewed this patient's available data, including medical history, events of note, physical examination and test results as part of my evaluation.

## 2022-04-28 NOTE — Plan of Care (Signed)
  Problem: Education: Goal: Knowledge of General Education information will improve Description: Including pain rating scale, medication(s)/side effects and non-pharmacologic comfort measures Outcome: Not Progressing   Problem: Health Behavior/Discharge Planning: Goal: Ability to manage health-related needs will improve Outcome: Not Progressing   Problem: Activity: Goal: Risk for activity intolerance will decrease Outcome: Not Progressing Note: Patient appears to be fearful of falling

## 2022-04-29 DIAGNOSIS — E43 Unspecified severe protein-calorie malnutrition: Secondary | ICD-10-CM | POA: Insufficient documentation

## 2022-04-29 LAB — COMPREHENSIVE METABOLIC PANEL WITH GFR
ALT: 20 U/L (ref 0–44)
AST: 29 U/L (ref 15–41)
Albumin: 2.5 g/dL — ABNORMAL LOW (ref 3.5–5.0)
Alkaline Phosphatase: 66 U/L (ref 38–126)
Anion gap: 14 (ref 5–15)
BUN: 21 mg/dL (ref 8–23)
CO2: 21 mmol/L — ABNORMAL LOW (ref 22–32)
Calcium: 8.6 mg/dL — ABNORMAL LOW (ref 8.9–10.3)
Chloride: 110 mmol/L (ref 98–111)
Creatinine, Ser: 0.74 mg/dL (ref 0.44–1.00)
GFR, Estimated: >60 mL/min (ref >60)
Glucose, Bld: 87 mg/dL (ref 70–99)
Potassium: 3.5 mmol/L (ref 3.5–5.1)
Sodium: 145 mmol/L (ref 135–145)
Total Bilirubin: 0.7 mg/dL (ref 0.3–1.2)
Total Protein: 5.3 g/dL — ABNORMAL LOW (ref 6.5–8.1)

## 2022-04-29 LAB — CBC
HCT: 30.5 % — ABNORMAL LOW (ref 36.0–46.0)
Hemoglobin: 9.8 g/dL — ABNORMAL LOW (ref 12.0–15.0)
MCH: 29.7 pg (ref 26.0–34.0)
MCHC: 32.1 g/dL (ref 30.0–36.0)
MCV: 92.4 fL (ref 80.0–100.0)
Platelets: 363 K/uL (ref 150–400)
RBC: 3.3 MIL/uL — ABNORMAL LOW (ref 3.87–5.11)
RDW: 13.8 % (ref 11.5–15.5)
WBC: 17.1 K/uL — ABNORMAL HIGH (ref 4.0–10.5)
nRBC: 1.3 % — ABNORMAL HIGH (ref 0.0–0.2)

## 2022-04-29 LAB — HEPARIN LEVEL (UNFRACTIONATED)
Heparin Unfractionated: 0.67 [IU]/mL (ref 0.30–0.70)
Heparin Unfractionated: 0.56 [IU]/mL (ref 0.30–0.70)

## 2022-04-29 LAB — GLUCOSE, CAPILLARY: Glucose-Capillary: 99 mg/dL (ref 70–99)

## 2022-04-29 LAB — APTT: aPTT: 49 s — ABNORMAL HIGH (ref 24–36)

## 2022-04-29 LAB — MAGNESIUM: Magnesium: 2.2 mg/dL (ref 1.7–2.4)

## 2022-04-29 LAB — PHOSPHORUS: Phosphorus: 3.3 mg/dL (ref 2.5–4.6)

## 2022-04-29 MED ORDER — BOOST / RESOURCE BREEZE PO LIQD CUSTOM
1.0000 | Freq: Three times a day (TID) | ORAL | Status: DC
  Administered 2022-04-29 – 2022-05-01 (×7): 1 via ORAL

## 2022-04-29 NOTE — Progress Notes (Signed)
Patient ID: [name], female   DOB: 1932-07-09, 88 y.o.   MRN: [id number] 6 Days Post-Op    Subjective: Had 3 BMs yesterday ROS negative except as listed above. Objective: Vital signs in last 24 hours: Temp:  [97.6 F (36.4 C)-98.1 F (36.7 C)] 98.1 F (36.7 C) (12/09 0803) Pulse Rate:  [63-106] 79 (12/09 0803) Resp:  [15-20] 16 (12/09 0803) BP: (137-159)/(67-90) 159/87 (12/09 0803) SpO2:  [90 %-100 %] 100 % (12/09 0803) Weight:  [50.7 kg] 50.7 kg (12/08 1400) Last BM Date :  (PTA)  Intake/Output from previous day: 12/08 0701 - 12/09 0700 In: 2480.4 [P.O.:120; I.V.:1860.4; IV Piggyback:500] Out: 1050 [Urine:450; Emesis/NG NLGXQJ:194] Intake/Output this shift: No intake/output data recorded.  General appearance: sleepy Head: scalp lac with staples Resp: clear to auscultation bilaterally GI: soft, midline incision CDI , +BS  Lab Results: CBC  Recent Labs    04/27/22 0430 04/29/22 0731  WBC 14.3* 17.1*  HGB 8.4* 9.8*  HCT 26.8* 30.5*  PLT 169 363   BMET Recent Labs    04/27/22 0430 04/28/22 0518  NA 141 144  K 3.7 3.6  CL 105 107  CO2 28 25  GLUCOSE 104* 96  BUN 31* 27*  CREATININE 0.88 0.90  CALCIUM 8.8* 8.7*   PT/INR No results for input(s): "LABPROT", "INR" in the last 72 hours. ABG No results for input(s): "PHART", "HCO3" in the last 72 hours.  Invalid input(s): "PCO2", "PO2"  Studies/Results: VAS [place] UPPER EXTREMITY VENOUS DUPLEX  Result Date: 04/28/2022 UPPER VENOUS STUDY  Patient Name:  YAZLYNN BIRKELAND  Date of Exam:   04/28/2022 Medical Rec #: [id number]         Accession #:    [id number] Date of Birth: Aug 06, 1932         Patient Gender: F Patient Age:   26 years Exam Location:  [facility] Procedure:      VAS [place] UPPER EXTREMITY VENOUS DUPLEX Referring Phys: Jerene Pitch MEUTH --------------------------------------------------------------------------------  Indications: Asymmetrical swelling of left arm, history of DVT, s/p mechanical fall  with course of treatment requiring cessation of Eliquis. Anticoagulation: Lovenox. Limitations: Poor ultrasound/tissue interface. Comparison Study: No prior upper extremity studies. Prior lower extremity venous                   on 04-24-2022 was negative for DVT. Performing Technologist: Darlin Coco RDMS, RVT  Examination Guidelines: A complete evaluation includes B-mode imaging, spectral Doppler, color Doppler, and power Doppler as needed of all accessible portions of each vessel. Bilateral testing is considered an integral part of a complete examination. Limited examinations for reoccurring indications may be performed as noted.  Right Findings: +----------+------------+---------+-----------+----------+-------+ RIGHT     CompressiblePhasicitySpontaneousPropertiesSummary +----------+------------+---------+-----------+----------+-------+ Subclavian               Yes       Yes                      +----------+------------+---------+-----------+----------+-------+  Left Findings: +----------+------------+---------+-----------+----------+-------+ LEFT      CompressiblePhasicitySpontaneousPropertiesSummary +----------+------------+---------+-----------+----------+-------+ IJV           None       No        No                Acute  +----------+------------+---------+-----------+----------+-------+ Subclavian               No        No  Acute  +----------+------------+---------+-----------+----------+-------+ Axillary      None       No        No                Acute  +----------+------------+---------+-----------+----------+-------+ Brachial    Partial      Yes       Yes               Acute  +----------+------------+---------+-----------+----------+-------+ Radial        Full                                          +----------+------------+---------+-----------+----------+-------+ Ulnar         Full                                           +----------+------------+---------+-----------+----------+-------+ Cephalic    Partial      Yes       Yes               Acute  +----------+------------+---------+-----------+----------+-------+ Basilic     Partial      Yes       Yes               Acute  +----------+------------+---------+-----------+----------+-------+  Summary:  Right: No evidence of thrombosis in the subclavian.  Left: Findings consistent with acute deep vein thrombosis involving the left internal jugular vein, left subclavian vein, left axillary vein and left brachial veins. Findings consistent with acute superficial vein thrombosis involving the left basilic vein and left cephalic vein.  *See table(s) above for measurements and observations.  Diagnosing physician: Servando Snare MD Electronically signed by Servando Snare MD on 04/28/2022 at 5:02:34 PM.    Final     Anti-infectives: Anti-infectives (From admission, onward)    None       Assessment/Plan: GLF Grade 4 spleen lac with active extravasation - S/P splenectomy by Dr. Ninfa Linden 12/3, ileus improving, D/C NGT, start clears. Plan vaccines prior to D/C.  Scalp lac - repaired in the OR, will need staples removed on POD7 (12/10) Hx DVT/PE on Eliquis - reversed, duplex BLE negative for DVT; LUE +DVT - see below ABL anemia - hgb 9.8 HTN - hold PO meds, scheduled low-dose IV metoprolol (patient takes carvedilol at home). And PRN doses  PMH lymphoma  PMH pacemaker placement  Delirium - Needs better sleep hygiene. Keep awake during day, Allow bedtime ambien   VTE - LUE DVT - heparin drip for now, eventual transition to home Eliquis FEN - D/C NGT, clears + boost breeze, decrease IVF ID: no abx currently, leukocytosis improving and is likely secondary to asplenia. Afebrile.  Dispo - 4NP, PT/OT, clears  PT/OT currently recommend SNF, daughter does not want pt to go to SNF   I spoke with patient's daughter at bedside.  LOS: 6 days    Georganna Skeans, MD, MPH,  FACS Trauma & General Surgery Use [email or web address] to contact on call provider  04/29/2022

## 2022-04-29 NOTE — Progress Notes (Signed)
PHARMACY - TOTAL PARENTERAL NUTRITION CONSULT NOTE   Indication: Prolonged ileus  Patient Measurements: Height: 5\' 4"  (162.6 cm) Weight: 50.7 kg (111 lb 12.4 oz) IBW/kg (Calculated) : 54.7 TPN AdjBW (KG): 50.7 Body mass index is 19.19 kg/m. Usual Weight: 50 kg  Assessment: 88 yo female presented on 04/23/22 as trauma after mechanical fall. Patient is s/p exlap and splenectomy. Pharmacy consulted for TPN initiation on 12/7 for prolonged ileus in setting of severe malnutrition.   TPN access delayed due to upper extremity swelling - found to have LUE DVT. While awaiting PICC line placement patient had 3 documented BM's on 12/8. Surgery team to remove NGT and start clear liquids. Holding off on TPN for now.  RD Assessment: Estimated Needs Total Energy Estimated Needs: 1550-1750 Total Protein Estimated Needs: 75-85 grams Total Fluid Estimated Needs: 1.5-1.7 L  Current Nutrition:  Starting clear liquids 12/9  Plan:  D/c TPN associated labs/orders  Pharmacy to formally sign off TPN consult. Please re-consult if further nutritional support with TPN needed.  Dimple Nanas, PharmD, BCPS 04/29/2022 9:57 AM

## 2022-04-29 NOTE — Progress Notes (Addendum)
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 Apixaban was reversed on admission prior to procedure with Andexxa. Patient found to have new LUE DVT. Pharmacy consulted to start heparin with no bolus. Last dose of apixaban was 12/3 AM PTA and currently on Lovenox 30mg  [place] q12h inpatient, last dose 12/8 AM. Hg low stable 8.4, plt up to 169 (last CBC on 12/7).   Heparin level 0.67, aPTT low at 49s however both levels are increasing so likely a true heparin level. CBC stable.   Goal of Therapy:  Heparin level 0.3-0.7 units/ml Monitor platelets by anticoagulation protocol: Yes   Plan:  Reduce heparin drip slightly to 900 units/hr to prevent supratherapeutic levels Check 8 hour heparin level Monitor daily heparin level, CBC, s/sx bleeding  Dimple Nanas, PharmD, BCPS 04/29/2022 9:20 AM

## 2022-04-29 NOTE — Progress Notes (Signed)
Mobility Specialist Progress Note   04/29/22 1244  Mobility  Activity Transferred from bed to chair  Level of Assistance  (+3A)  Assistive Device  (HHA)  Distance Ambulated (ft) 2 ft  Activity Response Tolerated fair  $Mobility charge 1 Mobility   RN requesting assistance to get pt from bed to chair. Required +2A to get pt EOB and to stand, pivot to chair. Also, required knee blocking when standing d/t generalized weakness and impaired motor control. Upon sitting, pt incontinent and soiling pad in chair. Requiring +3A to stand pt for pericare, maxA d/t fatigue from pt. Once clean, left in chair w/ RN and family still in room.   Holland Falling Mobility Specialist Please contact via SecureChat or  Rehab office at [phone]

## 2022-04-29 NOTE — Progress Notes (Signed)
ANTICOAGULATION CONSULT NOTE - Follow Up Consult  Pharmacy Consult for Heparin Indication:  new LUE DVT  Patient Measurements: Height: 5\' 4"  (162.6 cm) Weight: 50.7 kg (111 lb 12.4 oz) IBW/kg (Calculated) : 54.7 Heparin Dosing Weight: 50.7 kg  Labs: Recent Labs    04/27/22 0430 04/28/22 0518 04/28/22 2137 04/29/22 0731 04/29/22 1604  HGB 8.4*  --   --  9.8*  --   HCT 26.8*  --   --  30.5*  --   PLT 169  --   --  363  --   APTT  --   --  31 49*  --   HEPARINUNFRC  --   --  0.57 0.67 0.56  CREATININE 0.88 0.90  --  0.74  --     Estimated Creatinine Clearance: 39.7 mL/min (by C-G formula based on SCr of 0.74 mg/dL).  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 Apixaban was reversed on admission prior to procedure with Andexxa. Patient found to have new LUE DVT. Pharmacy consulted to start heparin with no bolus. Last dose of apixaban was 12/3 AM PTA and last dose of Lovenox 30mg  [place] q12h inpatient given 12/8 AM.    Heparin level is now therapeutic (0.56) on 900 units/hr, after drip decreased from 950 units/hr this am when level was upper-range therapeutic (0.67).    Goal of Therapy:  Heparin level 0.3-0.7 units/ml Monitor platelets by anticoagulation protocol: Yes   Plan:  Continue heparin drip at 900 units/hr Next heparin level and CBC in am.  Arty Baumgartner, RPh 04/29/2022,6:15 PM

## 2022-04-30 LAB — CBC
HCT: 30.3 % — ABNORMAL LOW (ref 36.0–46.0)
Hemoglobin: 9.3 g/dL — ABNORMAL LOW (ref 12.0–15.0)
MCH: 28.7 pg (ref 26.0–34.0)
MCHC: 30.7 g/dL (ref 30.0–36.0)
MCV: 93.5 fL (ref 80.0–100.0)
Platelets: 406 10*3/uL — ABNORMAL HIGH (ref 150–400)
RBC: 3.24 MIL/uL — ABNORMAL LOW (ref 3.87–5.11)
RDW: 13.9 % (ref 11.5–15.5)
WBC: 12.8 10*3/uL — ABNORMAL HIGH (ref 4.0–10.5)
nRBC: 4.1 % — ABNORMAL HIGH (ref 0.0–0.2)

## 2022-04-30 LAB — HEPARIN LEVEL (UNFRACTIONATED): Heparin Unfractionated: 0.49 IU/mL (ref 0.30–0.70)

## 2022-04-30 LAB — GLUCOSE, CAPILLARY: Glucose-Capillary: 126 mg/dL — ABNORMAL HIGH (ref 70–99)

## 2022-04-30 NOTE — Progress Notes (Signed)
Patient ID: Sarah [name], female   DOB: May 25, 1932, 88 y.o.   MRN: [id number] 7 Days Post-Op    Subjective: BM, tolerated clears ROS negative except as listed above. Objective: Vital signs in last 24 hours: Temp:  [97.4 F (36.3 C)-98.8 F (37.1 C)] 97.4 F (36.3 C) (12/10 0249) Pulse Rate:  [79-86] 84 (12/10 0249) Resp:  [15-20] 18 (12/10 0249) BP: (117-151)/(60-84) 141/84 (12/10 0249) SpO2:  [94 %-96 %] 95 % (12/09 2257) Last BM Date : 04/29/22  Intake/Output from previous day: 12/09 0701 - 12/10 0700 In: 680.9 [I.V.:580.9; IV Piggyback:100] Out: 500 [Urine:450; Emesis/NG output:50] Intake/Output this shift: No intake/output data recorded.  General appearance: alert and cooperative Resp: clear to auscultation bilaterally GI: soft, incision CDI More alert Scalp lac CDI Lab Results: CBC  Recent Labs    04/29/22 0731 04/30/22 0324  WBC 17.1* 12.8*  HGB 9.8* 9.3*  HCT 30.5* 30.3*  PLT 363 406*   BMET Recent Labs    04/28/22 0518 04/29/22 0731  NA 144 145  K 3.6 3.5  CL 107 110  CO2 25 21*  GLUCOSE 96 87  BUN 27* 21  CREATININE 0.90 0.74  CALCIUM 8.7* 8.6*   PT/INR No results for input(s): "LABPROT", "INR" in the last 72 hours. ABG No results for input(s): "PHART", "HCO3" in the last 72 hours.  Invalid input(s): "PCO2", "PO2"  Studies/Results:   Anti-infectives: Anti-infectives (From admission, onward)    None       Assessment/Plan: GLF Grade 4 spleen lac with active extravasation - S/P splenectomy by Dr. Ninfa Linden 12/3, ileus improving, D/C NGT, start clears. Plan vaccines prior to D/C. Remove staples 12/15. Scalp lac - repaired in the OR, will need staples removed 12/15 Hx DVT/PE on Eliquis - reversed, duplex BLE negative for DVT; LUE +DVT - see below ABL anemia - hgb 9.3 HTN - hold PO meds, scheduled low-dose IV metoprolol (patient takes carvedilol at home). And PRN doses  PMH lymphoma  PMH pacemaker placement  Delirium - improving,  slept yesterday  VTE - LUE DVT - heparin drip for now, eventual transition to home Eliquis FEN - fulls today, D/C IVF ID: no abx currently, leukocytosis improving and is likely secondary to asplenia.  Dispo - 4NP, PT/OT, fulls today I spoke with patient's daughter at bedside and she is agreeable with SNF placement if that is what is recommended.  LOS: 7 days    Georganna Skeans, MD, MPH, FACS Trauma & General Surgery Use [email or web address] to contact on call provider  04/30/2022

## 2022-04-30 NOTE — Progress Notes (Signed)
ANTICOAGULATION CONSULT NOTE - Follow Up Consult  Pharmacy Consult for Heparin Indication:  new LUE DVT  Patient Measurements: Height: 5\' 4"  (162.6 cm) Weight: 50.7 kg (111 lb 12.4 oz) IBW/kg (Calculated) : 54.7 Heparin Dosing Weight: 50.7 kg  Labs: Recent Labs    04/28/22 0518 04/28/22 2137 04/28/22 2137 04/29/22 0731 04/29/22 1604 04/30/22 0324  HGB  --   --   --  9.8*  --  9.3*  HCT  --   --   --  30.5*  --  30.3*  PLT  --   --   --  363  --  406*  APTT  --  31  --  49*  --   --   HEPARINUNFRC  --  0.57   < > 0.67 0.56 0.49  CREATININE 0.90  --   --  0.74  --   --    < > = values in this interval not displayed.     Estimated Creatinine Clearance: 39.7 mL/min (by C-G formula based on SCr of 0.74 mg/dL).  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 Apixaban was reversed on admission prior to procedure with Andexxa. Patient found to have new LUE DVT. Pharmacy consulted to start heparin with no bolus. Last dose of apixaban was 12/3 AM PTA and last dose of Lovenox 30mg  [place] q12h inpatient given 12/8 AM.   Heparin level is therapeutic (0.49) on 900 units/hr. CBC stable.  Goal of Therapy:  Heparin level 0.3-0.7 units/ml Monitor platelets by anticoagulation protocol: Yes   Plan:  Continue heparin drip at 900 units/hr Next heparin level and CBC in am.  Dimple Nanas, PharmD, BCPS 04/30/2022 7:16 AM

## 2022-05-01 LAB — GLUCOSE, CAPILLARY: Glucose-Capillary: 136 mg/dL — ABNORMAL HIGH (ref 70–99)

## 2022-05-01 LAB — CBC
HCT: 28.6 % — ABNORMAL LOW (ref 36.0–46.0)
Hemoglobin: 9.3 g/dL — ABNORMAL LOW (ref 12.0–15.0)
MCH: 29.7 pg (ref 26.0–34.0)
MCHC: 32.5 g/dL (ref 30.0–36.0)
MCV: 91.4 fL (ref 80.0–100.0)
Platelets: 459 10*3/uL — ABNORMAL HIGH (ref 150–400)
RBC: 3.13 MIL/uL — ABNORMAL LOW (ref 3.87–5.11)
RDW: 14 % (ref 11.5–15.5)
WBC: 9.9 10*3/uL (ref 4.0–10.5)
nRBC: 8.9 % — ABNORMAL HIGH (ref 0.0–0.2)

## 2022-05-01 LAB — HEPARIN LEVEL (UNFRACTIONATED): Heparin Unfractionated: 0.46 IU/mL (ref 0.30–0.70)

## 2022-05-01 LAB — SURGICAL PATHOLOGY

## 2022-05-01 MED ORDER — CARVEDILOL 6.25 MG PO TABS
6.2500 mg | ORAL_TABLET | Freq: Two times a day (BID) | ORAL | Status: DC
  Administered 2022-05-01 – 2022-05-06 (×9): 6.25 mg via ORAL
  Filled 2022-05-01 (×10): qty 1

## 2022-05-01 MED ORDER — AMLODIPINE BESYLATE 2.5 MG PO TABS
2.5000 mg | ORAL_TABLET | Freq: Every day | ORAL | Status: DC
  Administered 2022-05-01 – 2022-05-06 (×5): 2.5 mg via ORAL
  Filled 2022-05-01 (×6): qty 1

## 2022-05-01 NOTE — Progress Notes (Cosign Needed Addendum)
Patient ID: Sarah [name], female   DOB: May 28, 1932, 88 y.o.   MRN: [id number] 8 Days Post-Op    Subjective: Multiple BMs last 24h  Tolerating FLD. Prt daughter she had some grits and ice cream this AM. She does like vanilla ensure.   She is confused this morning and, per report, did not sleep much last night. She is able to tell me that she fell twice and has been hospitalized twice as as result.  ROS negative except as listed above. Objective: Vital signs in last 24 hours: Temp:  [97.7 F (36.5 C)-98.4 F (36.9 C)] 98.4 F (36.9 C) (12/11 0311) Pulse Rate:  [65-82] 82 (12/11 0311) Resp:  [15-20] 17 (12/10 2320) BP: (114-159)/(70-93) 114/86 (12/11 0311) SpO2:  [95 %-98 %] 95 % (12/11 0311) Last BM Date : 04/29/22  Intake/Output from previous day: 12/10 0701 - 12/11 0700 In: 1205.5 [I.V.:1205.5] Out: 300 [Urine:300] Intake/Output this shift: No intake/output data recorded.  General appearance: alert and cooperative Resp: clear to auscultation bilaterally GI: soft, incision CDI More alert Scalp lac CDI Neuro: oriented to self only, reports year as 2002, location [place] [facility]  Lab Results: CBC  Recent Labs    04/30/22 0324 05/01/22 0452  WBC 12.8* 9.9  HGB 9.3* 9.3*  HCT 30.3* 28.6*  PLT 406* 459*   BMET Recent Labs    04/29/22 0731  NA 145  K 3.5  CL 110  CO2 21*  GLUCOSE 87  BUN 21  CREATININE 0.74  CALCIUM 8.6*   PT/INR   Studies/Results:   Anti-infectives: Anti-infectives (From admission, onward)    None       Assessment/Plan: GLF Grade 4 spleen lac with active extravasation - S/P splenectomy by Dr. Ninfa Linden 12/3, ileus improving, D/C NGT, start clears. Plan vaccines prior to D/C. Remove staples 12/15. Path is still pending. Scalp lac - repaired in the OR, will need staples removed 12/15 Hx DVT/PE on Eliquis - reversed, duplex BLE negative for DVT; LUE 12/8  +DVT - see below ABL anemia - hgb 9.3, stable HTN - stop scheduled IV metop and  resume home Coreg and Norvasc. PMH lymphoma  PMH pacemaker placement  Delirium - reportedly slept well 12/9 and had a good day 12/10, confused this AM.   VTE - LUE DVT, hgb stable and hep gtt therapeutic, tolerating PO, transition to Eliquis today  FEN - SOFT today, SLIV, ensure BID ID: no abx currently, leukocytosis improving and is likely secondary to asplenia.  Dispo - 4NP, PT/OT, SOFT today I spoke with patient's daughter at bedside and she is agreeable with SNF placement if that is what is recommended.  LOS: 8 days   Obie Dredge, PA-C  Trauma & General Surgery Use [email or web address] to contact on call provider  05/01/2022

## 2022-05-01 NOTE — Progress Notes (Signed)
Physical Therapy Treatment Patient Details Name: Sarah [name] MRN: [id number] DOB: Nov 29, 1932 Today's Date: 05/01/2022   History of Present Illness 88 yo female admitted 12/3 after fall with spleen lac s/p splenectomy, head lac. PMhx; lymphoma (in remission), dementia, hypothyroidism, HLD, PE on Eliquis, second-degree AV block (s/p pacemaker), CVA, thrombocytopenia, and recurrent falls    PT Comments    Pt progressing slowly towards all goals. Pt continues with significant fear of falling and inability to stand upright to progress ambulation. Pt remains to require maxA for transfer to chair. Pt also remains confused which is altered from prior to fall. Gave LE HEP to daughter to complete in chair. Acute PT to continue to follow.    Recommendations for follow up therapy are one component of a multi-disciplinary discharge planning process, led by the attending physician.  Recommendations may be updated based on patient status, additional functional criteria and insurance authorization.  Follow Up Recommendations  Skilled nursing-short term rehab (<3 hours/day) Can patient physically be transported by private vehicle: No   Assistance Recommended at Discharge Frequent or constant Supervision/Assistance  Patient can return home with the following A lot of help with walking and/or transfers;A lot of help with bathing/dressing/bathroom;Assistance with cooking/housework;Direct supervision/assist for medications management;Assist for transportation;Direct supervision/assist for financial management   Equipment Recommendations  None recommended by PT    Recommendations for Other Services       Precautions / Restrictions Precautions Precautions: Fall Precaution Comments: confusion Restrictions Weight Bearing Restrictions: No     Mobility  Bed Mobility Overal bed mobility: Needs Assistance Bed Mobility: Supine to Sit     Supine to sit: Max assist, HOB elevated     General bed  mobility comments: pt with little initiation of movement despite max    Transfers Overall transfer level: Needs assistance Equipment used: 2 person hand held assist (face to face transfer with gait belt) Transfers: Bed to chair/wheelchair/BSC, Sit to/from Stand Sit to Stand: Max assist, +2 physical assistance Stand pivot transfers: Max assist, +2 physical assistance         General transfer comment: PT providing bilat knee block, unable to achieve full upright posture suspect due to onset of abdominal pain with upright posture in conjuction with fear of falling, pt did better on second attempt with support at chest to promote trunk extension. maxAX2 to transfer to chair    Ambulation/Gait               General Gait Details: unable   Stairs             Wheelchair Mobility    Modified Rankin (Stroke Patients Only)       Balance Overall balance assessment: Needs assistance Sitting-balance support: No upper extremity supported, Feet supported Sitting balance-Leahy Scale: Fair Sitting balance - Comments: maxA progressing to minA, frequently asked pt to hug PT in front of her to promote anterior weight shift as pt with posterior bias Postural control: Posterior lean Standing balance support: Bilateral upper extremity supported, Reliant on assistive device for balance Standing balance-Leahy Scale: Poor Standing balance comment: maxA                            Cognition Arousal/Alertness: Awake/alert Behavior During Therapy: Anxious (significant fear of falling) Overall Cognitive Status: Impaired/Different from baseline Area of Impairment: Orientation, Attention, Memory, Following commands, Safety/judgement, Awareness, Problem solving  Orientation Level: Disoriented to, Place, Time, Situation (dtr reports before fall pt was A7Ox4 consistantly) Current Attention Level: Focused Memory: Decreased recall of precautions, Decreased  short-term memory Following Commands: Follows one step commands with increased time Safety/Judgement: Decreased awareness of safety, Decreased awareness of deficits Awareness: Intellectual Problem Solving: Slow processing, Difficulty sequencing, Decreased initiation, Requires verbal cues, Requires tactile cues General Comments: pt reoriented to situation and place however pt with no recall. Pt continues with significant fear of falling requiring verbal cues to give PT a hug to promote anterior weight shift vs retropulsion        Exercises General Exercises - Lower Extremity Ankle Circles/Pumps: AROM, Both, 5 reps, Seated Long Arc Quad: Both, AAROM, 5 reps, Seated Hip Flexion/Marching: AROM, Both, 5 reps, Seated    General Comments General comments (skin integrity, edema, etc.): VSS on RA, pt with L frontal hematoma with staples      Pertinent Vitals/Pain Pain Assessment Pain Assessment: Faces Faces Pain Scale: Hurts little more Pain Location: abdomen Pain Descriptors / Indicators: Grimacing (resistance to upright posture) Pain Intervention(s): Monitored during session    Home Living                          Prior Function            PT Goals (current goals can now be found in the care plan section) Acute Rehab PT Goals PT Goal Formulation: With family Time For Goal Achievement: 05/09/22 Potential to Achieve Goals: Fair Progress towards PT goals: Progressing toward goals    Frequency    Min 3X/week      PT Plan Current plan remains appropriate    Co-evaluation              AM-PAC PT "6 Clicks" Mobility   Outcome Measure  Help needed turning from your back to your side while in a flat bed without using bedrails?: A Lot Help needed moving from lying on your back to sitting on the side of a flat bed without using bedrails?: A Lot Help needed moving to and from a bed to a chair (including a wheelchair)?: A Lot Help needed standing up from a chair  using your arms (e.g., wheelchair or bedside chair)?: A Lot Help needed to walk in hospital room?: Total Help needed climbing 3-5 steps with a railing? : Total 6 Click Score: 10    End of Session Equipment Utilized During Treatment: Gait belt Activity Tolerance: Patient tolerated treatment well Patient left: with call bell/phone within reach;in chair;with chair alarm set;with family/visitor present Nurse Communication: Mobility status;Need for lift equipment PT Visit Diagnosis: Other abnormalities of gait and mobility (R26.89);History of falling (Z91.81);Muscle weakness (generalized) (M62.81)     Time: 3009-2330 PT Time Calculation (min) (ACUTE ONLY): 24 min  Charges:  $Therapeutic Exercise: 8-22 mins $Therapeutic Activity: 8-22 mins                     Kittie Plater, PT, DPT Acute Rehabilitation Services Secure chat preferred Office #: [phone]    Berline Lopes 05/01/2022, 10:22 AM

## 2022-05-01 NOTE — Progress Notes (Signed)
ANTICOAGULATION CONSULT NOTE - Follow Up Consult  Pharmacy Consult for Heparin Indication:  new LUE DVT  Patient Measurements: Height: 5\' 4"  (162.6 cm) Weight: 50.7 kg (111 lb 12.4 oz) IBW/kg (Calculated) : 54.7 Heparin Dosing Weight: 50.7 kg  Labs: Recent Labs    04/28/22 2137 04/28/22 2137 04/29/22 0731 04/29/22 1604 04/30/22 0324 05/01/22 0452  HGB  --    < > 9.8*  --  9.3* 9.3*  HCT  --   --  30.5*  --  30.3* 28.6*  PLT  --   --  363  --  406* 459*  APTT 31  --  49*  --   --   --   HEPARINUNFRC 0.57  --  0.67 0.56 0.49 0.46  CREATININE  --   --  0.74  --   --   --    < > = values in this interval not displayed.     Estimated Creatinine Clearance: 39.7 mL/min (by C-G formula based on SCr of 0.74 mg/dL).  Assessment: Sarah [name] with hx of lymphoma, DVT/PE on apixaban PTA presenting with ruptured spleen, s/p splenectomy 12/3, now with ileus post-op. Apixaban was reversed on admission prior to procedure with Andexxa. Patient found to have new LUE DVT. Pharmacy consulted to start heparin with no bolus. Last dose of apixaban was 12/3 AM PTA and last dose of Lovenox 30mg  [place] q12h inpatient given 12/8 AM.   Heparin level is therapeutic (0.46) on 900 units/hr. CBC stable.  Goal of Therapy:  Heparin level 0.3-0.7 units/ml Monitor platelets by anticoagulation protocol: Yes   Plan:  Continue heparin drip at 900 units/hr Next heparin level and CBC in am.  Angus Seller, PharmD Candidate 05/01/2022 9:59 AM

## 2022-05-02 LAB — CBC
HCT: 29.2 % — ABNORMAL LOW (ref 36.0–46.0)
Hemoglobin: 9.2 g/dL — ABNORMAL LOW (ref 12.0–15.0)
MCH: 29.2 pg (ref 26.0–34.0)
MCHC: 31.5 g/dL (ref 30.0–36.0)
MCV: 92.7 fL (ref 80.0–100.0)
Platelets: 494 10*3/uL — ABNORMAL HIGH (ref 150–400)
RBC: 3.15 MIL/uL — ABNORMAL LOW (ref 3.87–5.11)
RDW: 14.3 % (ref 11.5–15.5)
WBC: 10.6 10*3/uL — ABNORMAL HIGH (ref 4.0–10.5)
nRBC: 6 % — ABNORMAL HIGH (ref 0.0–0.2)

## 2022-05-02 LAB — BASIC METABOLIC PANEL
Anion gap: 9 (ref 5–15)
BUN: 12 mg/dL (ref 8–23)
CO2: 23 mmol/L (ref 22–32)
Calcium: 8.4 mg/dL — ABNORMAL LOW (ref 8.9–10.3)
Chloride: 109 mmol/L (ref 98–111)
Creatinine, Ser: 0.57 mg/dL (ref 0.44–1.00)
GFR, Estimated: >60 mL/min (ref >60)
Glucose, Bld: 109 mg/dL — ABNORMAL HIGH (ref 70–99)
Potassium: 3.2 mmol/L — ABNORMAL LOW (ref 3.5–5.1)
Sodium: 141 mmol/L (ref 135–145)

## 2022-05-02 LAB — URINALYSIS, ROUTINE W REFLEX MICROSCOPIC
Bilirubin Urine: NEGATIVE
Glucose, UA: NEGATIVE mg/dL
Hgb urine dipstick: NEGATIVE
Ketones, ur: NEGATIVE mg/dL
Nitrite: NEGATIVE
Protein, ur: NEGATIVE mg/dL
Specific Gravity, Urine: 1.012 (ref 1.005–1.030)
pH: 8 (ref 5.0–8.0)

## 2022-05-02 LAB — PATHOLOGIST SMEAR REVIEW

## 2022-05-02 LAB — SURGICAL PATHOLOGY, GROSS ONLY (NOT ARMC)

## 2022-05-02 LAB — GLUCOSE, CAPILLARY: Glucose-Capillary: 117 mg/dL — ABNORMAL HIGH (ref 70–99)

## 2022-05-02 LAB — HEPARIN LEVEL (UNFRACTIONATED): Heparin Unfractionated: 0.32 IU/mL (ref 0.30–0.70)

## 2022-05-02 MED ORDER — ENSURE ENLIVE PO LIQD
237.0000 mL | Freq: Two times a day (BID) | ORAL | Status: DC
  Administered 2022-05-02 – 2022-05-06 (×4): 237 mL via ORAL

## 2022-05-02 MED ORDER — POTASSIUM CHLORIDE 20 MEQ PO PACK
40.0000 meq | PACK | Freq: Two times a day (BID) | ORAL | Status: DC
  Filled 2022-05-02: qty 2

## 2022-05-02 MED ORDER — NITROFURANTOIN MONOHYD MACRO 100 MG PO CAPS
100.0000 mg | ORAL_CAPSULE | Freq: Two times a day (BID) | ORAL | Status: DC
  Administered 2022-05-02 – 2022-05-06 (×7): 100 mg via ORAL
  Filled 2022-05-02 (×9): qty 1

## 2022-05-02 MED ORDER — POTASSIUM CHLORIDE 10 MEQ/50ML IV SOLN
10.0000 meq | INTRAVENOUS | Status: AC
  Administered 2022-05-02 (×4): 10 meq via INTRAVENOUS
  Filled 2022-05-02 (×4): qty 50

## 2022-05-02 NOTE — Progress Notes (Signed)
Trauma/Critical Care Follow Up Note  Subjective:    Overnight Issues:   Objective:  Vital signs for last 24 hours: Temp:  [98 F (36.7 C)-99.4 F (37.4 C)] 98 F (36.7 C) (12/12 1212) Pulse Rate:  [69-85] 85 (12/12 1212) Resp:  [17-20] 18 (12/12 1212) BP: (125-159)/(69-99) 150/75 (12/12 1212) SpO2:  [93 %-98 %] 93 % (12/12 1212)  Hemodynamic parameters for last 24 hours:    Intake/Output from previous day: 12/11 0701 - 12/12 0700 In: 706.5 [P.O.:340; I.V.:366.5] Out: 300 [Urine:300]  Intake/Output this shift: Total I/O In: 154 [P.O.:100; I.V.:54] Out: 300 [Urine:300]  Vent settings for last 24 hours:    Physical Exam:  Gen: comfortable, no distress Neuro: non-focal exam HEENT: PERRL, wound cdi Neck: supple CV: RRR Pulm: unlabored breathing Abd: soft, NT, incision cdi  GU: clear yellow urine Extr: wwp, no edema   Results for orders placed or performed during the hospital encounter of 04/23/22 (from the past 24 hour(s))  Glucose, capillary     Status: Abnormal   Collection Time: 05/01/22  9:21 PM  Result Value Ref Range   Glucose-Capillary 136 (H) 70 - 99 mg/dL  CBC     Status: Abnormal   Collection Time: 05/02/22  3:14 AM  Result Value Ref Range   WBC 10.6 (H) 4.0 - 10.5 K/uL   RBC 3.15 (L) 3.87 - 5.11 MIL/uL   Hemoglobin 9.2 (L) 12.0 - 15.0 g/dL   HCT 29.2 (L) 36.0 - 46.0 %   MCV 92.7 80.0 - 100.0 fL   MCH 29.2 26.0 - 34.0 pg   MCHC 31.5 30.0 - 36.0 g/dL   RDW 14.3 11.5 - 15.5 %   Platelets 494 (H) 150 - 400 K/uL   nRBC 6.0 (H) 0.0 - 0.2 %  Heparin level (unfractionated)     Status: None   Collection Time: 05/02/22  3:14 AM  Result Value Ref Range   Heparin Unfractionated 0.32 0.30 - 0.70 IU/mL  Basic metabolic panel     Status: Abnormal   Collection Time: 05/02/22  3:14 AM  Result Value Ref Range   Sodium 141 135 - 145 mmol/L   Potassium 3.2 (L) 3.5 - 5.1 mmol/L   Chloride 109 98 - 111 mmol/L   CO2 23 22 - 32 mmol/L   Glucose, Bld 109 (H)  70 - 99 mg/dL   BUN 12 8 - 23 mg/dL   Creatinine, Ser 0.57 0.44 - 1.00 mg/dL   Calcium 8.4 (L) 8.9 - 10.3 mg/dL   GFR, Estimated >60 >60 mL/min   Anion gap 9 5 - 15    Assessment & Plan:   Present on Admission:  Ruptured spleen    LOS: 9 days   Additional comments:I reviewed the patient's new clinical lab test results.   and I reviewed the patients new imaging test results.    GLF  Grade 4 spleen lac with active extravasation - S/P splenectomy by Dr. Ninfa Linden 12/3, ileus improving, D/C NGT, start clears. Plan vaccines prior to D/C. Remove staples 12/15. Path is still pending. Scalp lac - repaired in the OR, will need staples removed 12/15 Hx DVT/PE on Eliquis - reversed, duplex BLE negative for DVT; LUE 12/8  +DVT - see below ABL anemia - hgb 9.3, stable HTN - stop scheduled IV metop and resume home Coreg and Norvasc. PMH lymphoma  PMH pacemaker placement  Delirium - waxes and wanes, looked good yest PM, confused this AM.    VTE - LUE DVT,  hgb stable and hep gtt therapeutic, tolerating PO, transition to 5mg  BID Eliquis when cleared by SLP  FEN - SOFT today, SLIV, ensure BID ID: no abx currently, leukocytosis improving and is likely secondary to asplenia.   Dispo - 4NP, PT/OT, SOFT today  I spoke with patient's daughter at bedside and she is agreeable with SNF placement if that is what is recommended. We also discussed the patient's eligibility for hospice based on current diagnoses.   Critical Care Total Time: 35 minutes  Jesusita Oka, MD Trauma & General Surgery Please use [email or web address] to contact on call provider  05/02/2022  *Care during the described time interval was provided by me. I have reviewed this patient's available data, including medical history, events of note, physical examination and test results as part of my evaluation.

## 2022-05-02 NOTE — TOC Progression Note (Signed)
Transition of Care [facility]) - Progression Note    Patient Details  Name: Sarah [name] MRN: [id number] Date of Birth: 1933-03-15  [facility] CM/SW Contact  Ella Bodo, RN Phone Number: 05/02/2022, 2:26 PM  Clinical Narrative:    Forde Dandy with patient's daughter, [name], regarding discharge planning.  Trauma Case Manager has left a message with Seth Bake in admissions at [facility], daughter's first choice for SNF. We discussed backup plan, should [facility] decline.  Daughter reluctantly agrees to patient's information being faxed out, only to [facility].  Will await word from [facility], and provide bed offers as available.     Expected Discharge Plan: Skilled Nursing Facility Barriers to Discharge: Continued Medical Work up  Expected Discharge Plan and Services Expected Discharge Plan: [place]   Discharge Planning Services: CM Consult   Living arrangements for the past 2 months: Single Family Home                                       Social Determinants of Health (SDOH) Interventions    Readmission Risk Interventions    11/04/2021   11:19 AM  Readmission Risk Prevention Plan  Transportation Screening Complete  HRI or [place] Complete  Social Work Consult for [place] Planning/Counseling Complete  Palliative Care Screening Not Applicable   Reinaldo Raddle, RN, BSN  Trauma/Neuro ICU Case Manager [phone]

## 2022-05-02 NOTE — Progress Notes (Signed)
Nutrition Follow-up  DOCUMENTATION CODES:   Severe malnutrition in context of chronic illness  INTERVENTION:   - Ensure Enlive po BID, each supplement provides 350 kcal and 20 grams of protein  - Encourage PO intake and provide feeding assistance as needed  - d/c Boost Breeze  NUTRITION DIAGNOSIS:   Severe Malnutrition related to chronic illness (dementia, angioimmunoblastic lymphoma) as evidenced by severe fat depletion, severe muscle depletion.  Ongoing, being addressed via diet advancement and oral nutrition supplements  GOAL:   Patient will meet greater than or equal to 90% of their needs  Progressing  MONITOR:   PO intake, Supplement acceptance, Labs, Weight trends, Skin, I & O's  REASON FOR ASSESSMENT:   Malnutrition Screening Tool    ASSESSMENT:   89 year old female who presented to the ED on 12/03 after a mechanical fall. PMH of angioimmunoblastic lymphoma, dementia, HTN, HLD, arthritis, anxiety, DVT, breast cancer. Pt admitted with high grade splenic laceration.  12/03 - s/p ex lap, splenectomy, repair of 7 cm scalp laceration 12/06 - NGT to LIWS for ileus 12/09 - clear liquid diet, NGT removed 12/10 - full liquid diet 12/11 - GI soft diet  Pt initially going to receive TPN; however, access was an issue which delayed initiation. Pt had 3 BMs overnight from 12/8-12/9 and pt started on clear liquids so TPN was never started. Pt now on GI soft diet.  Attempted to speak with pt and daughter at bedside. Pt unavailable at time of RD visit. Noted pt likes vanilla Ensure supplements. RD to order BID to aid pt in meeting kcal and protein needs.  Meal Completion: 50% x 2 documented meals  Medications reviewed and include: Boost Breeze TID, IV protonix, heparin drip, klor-con 40 mEq BID  Labs reviewed: potassium 3.2, WBC 10.6, hemoglobin 9.2  Diet Order:   Diet Order             DIET SOFT Room service appropriate? Yes; Fluid consistency: Thin  Diet effective  now                   EDUCATION NEEDS:   Education needs have been addressed  Skin:  Skin Assessment: Skin Integrity Issues: Incisions: abd, head  Last BM:  05/01/22 medium type 5  Height:   Ht Readings from Last 1 Encounters:  04/28/22 5\' 4"  (1.626 m)    Weight:   Wt Readings from Last 1 Encounters:  04/28/22 50.7 kg    Ideal Body Weight:  54.5 kg  BMI:  Body mass index is 19.19 kg/m.  Estimated Nutritional Needs:   Kcal:  1550-1750  Protein:  75-85 grams  Fluid:  1.5-1.7 L    Gustavus Bryant, MS, RD, LDN Inpatient Clinical Dietitian Please see AMiON for contact information.

## 2022-05-02 NOTE — Progress Notes (Signed)
Patient's daughter set patient up in bed with her legs dangling on floor with no staff to assist. This admission daughter has insisted staff two person transfer patient to  [facility]. Patient's knees buckle and is unable to stand. Daughter was educated about the safety parameters in place and informed to call staff for patient repositioning. Staff will monitor patient for comfort and safety.

## 2022-05-02 NOTE — Progress Notes (Signed)
ANTICOAGULATION CONSULT NOTE - Follow Up Consult  Pharmacy Consult for Heparin Indication:  new LUE DVT  Patient Measurements: Height: 5\' 4"  (162.6 cm) Weight: 50.7 kg (111 lb 12.4 oz) IBW/kg (Calculated) : 54.7 Heparin Dosing Weight: 50.7 kg  Labs: Recent Labs    04/30/22 0324 05/01/22 0452 05/02/22 0314  HGB 9.3* 9.3* 9.2*  HCT 30.3* 28.6* 29.2*  PLT 406* 459* 494*  HEPARINUNFRC 0.49 0.46 0.32  CREATININE  --   --  0.57     Estimated Creatinine Clearance: 39.7 mL/min (by C-G formula based on SCr of 0.57 mg/dL).  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 Apixaban was reversed on admission prior to procedure with Andexxa. Patient found to have new LUE DVT. Pharmacy consulted to start heparin with no bolus. Last dose of apixaban was 12/3 AM PTA and last dose of Lovenox 30mg  [place] q12h inpatient given 12/8 AM.   Heparin level is therapeutic (0.32) on 900 units/hr. CBC stable. No bleed issues reported.  Goal of Therapy:  Heparin level 0.3-0.7 units/ml Monitor platelets by anticoagulation protocol: Yes   Plan:  Continue heparin drip at 900 units/hr Monitor daily heparin level and CBC, s/sx bleeding   Arturo Morton, PharmD, BCPS Please check AMION for all [facility] contact numbers Clinical Pharmacist 05/02/2022 1:58 PM

## 2022-05-02 NOTE — Progress Notes (Signed)
SLP Cancellation Note  Patient Details Name: Sarah [name] MRN: [id number] DOB: November 17, 1932   Cancelled treatment:       Reason Eval/Treat Not Completed: Fatigue/lethargy limiting ability to participate;Other (comment) (Per RN, patient has been very tired, lethargic today and she recommends SLP return next date for evaluation.)  Sonia Baller, MA, CCC-SLP Speech Therapy

## 2022-05-03 ENCOUNTER — Ambulatory Visit (INDEPENDENT_AMBULATORY_CARE_PROVIDER_SITE_OTHER): Payer: Medicare Other

## 2022-05-03 DIAGNOSIS — I442 Atrioventricular block, complete: Secondary | ICD-10-CM

## 2022-05-03 LAB — CBC
HCT: 27.3 % — ABNORMAL LOW (ref 36.0–46.0)
Hemoglobin: 9.1 g/dL — ABNORMAL LOW (ref 12.0–15.0)
MCH: 30.2 pg (ref 26.0–34.0)
MCHC: 33.3 g/dL (ref 30.0–36.0)
MCV: 90.7 fL (ref 80.0–100.0)
Platelets: 557 10*3/uL — ABNORMAL HIGH (ref 150–400)
RBC: 3.01 MIL/uL — ABNORMAL LOW (ref 3.87–5.11)
RDW: 14.5 % (ref 11.5–15.5)
WBC: 12.6 10*3/uL — ABNORMAL HIGH (ref 4.0–10.5)
nRBC: 2.5 % — ABNORMAL HIGH (ref 0.0–0.2)

## 2022-05-03 LAB — CUP PACEART REMOTE DEVICE CHECK
Date Time Interrogation Session: 20231213081757
Implantable Lead Connection Status: 753985
Implantable Lead Connection Status: 753985
Implantable Lead Implant Date: 20230613
Implantable Lead Implant Date: 20230613
Implantable Lead Location: 753859
Implantable Lead Location: 753860
Implantable Lead Model: 377
Implantable Lead Model: 377
Implantable Lead Serial Number: 8000826615
Implantable Lead Serial Number: 8000886802
Implantable Pulse Generator Implant Date: 20230613
Pulse Gen Model: 407145
Pulse Gen Serial Number: 70422578

## 2022-05-03 LAB — GLUCOSE, CAPILLARY: Glucose-Capillary: 120 mg/dL — ABNORMAL HIGH (ref 70–99)

## 2022-05-03 LAB — HEPARIN LEVEL (UNFRACTIONATED)
Heparin Unfractionated: 0.26 IU/mL — ABNORMAL LOW (ref 0.30–0.70)
Heparin Unfractionated: 0.42 [IU]/mL (ref 0.30–0.70)

## 2022-05-03 MED ORDER — MAGIC MOUTHWASH W/LIDOCAINE
10.0000 mL | Freq: Four times a day (QID) | ORAL | Status: DC
  Administered 2022-05-03 – 2022-05-06 (×12): 10 mL via ORAL
  Filled 2022-05-03 (×16): qty 10

## 2022-05-03 MED ORDER — PANTOPRAZOLE SODIUM 40 MG PO TBEC
40.0000 mg | DELAYED_RELEASE_TABLET | Freq: Every day | ORAL | Status: DC
  Administered 2022-05-04 – 2022-05-05 (×2): 40 mg via ORAL
  Filled 2022-05-03 (×3): qty 1

## 2022-05-03 MED ORDER — MELATONIN 3 MG PO TABS
3.0000 mg | ORAL_TABLET | Freq: Every evening | ORAL | Status: DC | PRN
  Administered 2022-05-03: 3 mg via ORAL
  Filled 2022-05-03: qty 1

## 2022-05-03 NOTE — Progress Notes (Signed)
Mobility Specialist Progress Note   05/03/22 1626  Mobility  Activity Transferred from chair to bed  Level of Assistance +2 (takes two people)  Assistive Device Other (Comment) (HHA)  Distance Ambulated (ft) 2 ft  Activity Response Tolerated well  $Mobility charge 1 Mobility   RN requesting assistance to get pt from chair to bed. +2A modA to stand, pivot pt to EOB d/t pt's inability to stand fully upright and extend hips forward but no faults on transfer. Left in bed w/ call bell in reach and RN present in room.     Holland Falling Mobility Specialist Please contact via SecureChat or  Rehab office at [phone]

## 2022-05-03 NOTE — Progress Notes (Signed)
Trauma/Critical Care Follow Up Note  Subjective:    Overnight Issues:   Objective:  Vital signs for last 24 hours: Temp:  [97.6 F (36.4 C)-98.6 F (37 C)] 98.2 F (36.8 C) (12/13 0814) Pulse Rate:  [56-85] 60 (12/13 0814) Resp:  [18-20] 20 (12/13 0814) BP: (144-159)/(72-91) 159/91 (12/13 0814) SpO2:  [93 %-100 %] 95 % (12/13 0814)  Hemodynamic parameters for last 24 hours:    Intake/Output from previous day: 12/12 0701 - 12/13 0700 In: 459.8 [P.O.:100; I.V.:209.9; IV Piggyback:149.9] Out: 1150 [Urine:1150]  Intake/Output this shift: No intake/output data recorded.  Vent settings for last 24 hours:    Physical Exam:  Gen: comfortable, no distress Neuro: non-focal exam HEENT: PERRL Neck: supple CV: RRR Pulm: unlabored breathing Abd: soft, NT GU: clear yellow urine Extr: wwp, no edema   Results for orders placed or performed during the hospital encounter of 04/23/22 (from the past 24 hour(s))  Urinalysis, Routine w reflex microscopic Urine, Unspecified Source     Status: Abnormal   Collection Time: 05/02/22  2:50 PM  Result Value Ref Range   Color, Urine YELLOW YELLOW   APPearance CLOUDY (A) CLEAR   Specific Gravity, Urine 1.012 1.005 - 1.030   pH 8.0 5.0 - 8.0   Glucose, UA NEGATIVE NEGATIVE mg/dL   Hgb urine dipstick NEGATIVE NEGATIVE   Bilirubin Urine NEGATIVE NEGATIVE   Ketones, ur NEGATIVE NEGATIVE mg/dL   Protein, ur NEGATIVE NEGATIVE mg/dL   Nitrite NEGATIVE NEGATIVE   Leukocytes,Ua TRACE (A) NEGATIVE   RBC / HPF 0-5 0 - 5 RBC/hpf   WBC, UA 6-10 0 - 5 WBC/hpf   Bacteria, UA MANY (A) NONE SEEN   Squamous Epithelial / LPF 0-5 0 - 5   Mucus PRESENT   Glucose, capillary     Status: Abnormal   Collection Time: 05/02/22  9:25 PM  Result Value Ref Range   Glucose-Capillary 117 (H) 70 - 99 mg/dL  CBC     Status: Abnormal   Collection Time: 05/03/22  5:00 AM  Result Value Ref Range   WBC 12.6 (H) 4.0 - 10.5 K/uL   RBC 3.01 (L) 3.87 - 5.11 MIL/uL    Hemoglobin 9.1 (L) 12.0 - 15.0 g/dL   HCT 27.3 (L) 36.0 - 46.0 %   MCV 90.7 80.0 - 100.0 fL   MCH 30.2 26.0 - 34.0 pg   MCHC 33.3 30.0 - 36.0 g/dL   RDW 14.5 11.5 - 15.5 %   Platelets 557 (H) 150 - 400 K/uL   nRBC 2.5 (H) 0.0 - 0.2 %  Heparin level (unfractionated)     Status: Abnormal   Collection Time: 05/03/22  5:00 AM  Result Value Ref Range   Heparin Unfractionated 0.26 (L) 0.30 - 0.70 IU/mL    Assessment & Plan:   Present on Admission:  Ruptured spleen    LOS: 10 days   Additional comments:I reviewed the patient's new clinical lab test results.   and I reviewed the patients new imaging test results.    GLF   Grade 4 spleen lac with active extravasation - S/P splenectomy by Dr. Ninfa Linden 12/3, ileus improving, D/C NGT, start clears. Plan vaccines prior to D/C. Remove staples 12/15. Path negative. Scalp lac - repaired in the OR, will need staples removed 12/15 Hx DVT/PE on Eliquis - reversed, duplex BLE negative for DVT; LUE 12/8  +DVT - see below ABL anemia - hgb 9.3, stable HTN - stop scheduled IV metop and resume home Coreg  and Norvasc. PMH lymphoma  PMH pacemaker placement  Delirium - waxes and wanes, looked okay this AM.    VTE - LUE DVT, hgb stable and hep gtt therapeutic, tolerating PO, transition to 5mg  BID Eliquis when cleared by SLP  FEN - SOFT today, SLIV, ensure BID ID: no abx currently, leukocytosis improving and is likely secondary to asplenia.   Dispo - 4NP, PT/OT   I spoke with patient's daughter at bedside and she is agreeable with SNF placement. Family vehemently declines consideration of hospice.     Jesusita Oka, MD Trauma & General Surgery Please use [email or web address] to contact on call provider  05/03/2022  *Care during the described time interval was provided by me. I have reviewed this patient's available data, including medical history, events of note, physical examination and test results as part of my evaluation.

## 2022-05-03 NOTE — Progress Notes (Signed)
Physical Therapy Treatment Patient Details Name: Sarah [name] MRN: [id number] DOB: 07/22/1932 Today's Date: 05/03/2022   History of Present Illness 88 yo female admitted 12/3 after fall with spleen lac s/p splenectomy, head lac. LUE DVT identified on 12/8. PMhx; lymphoma (in remission), dementia, hypothyroidism, HLD, PE on Eliquis, second-degree AV block (s/p pacemaker), CVA, thrombocytopenia, and recurrent falls    PT Comments    Pt tolerates treatment well with frequent encouragement from family and therapists for participation in transfers and early ambulation. Pt continues to demonstrate posterior preference, with insufficient anterior weight shift in transfers leading to posterior lean and increased risk for falls. PT assists pt in increasing hip and trunk extension in static standing. Pt demonstrates improved posture and transfer ability with use of RW when compared to hand hold. Pt will benefit from continued progression of ambulation next session to more consistently engage LE musculature. PT continues to recommend SNF placement.   Recommendations for follow up therapy are one component of a multi-disciplinary discharge planning process, led by the attending physician.  Recommendations may be updated based on patient status, additional functional criteria and insurance authorization.  Follow Up Recommendations  Skilled nursing-short term rehab (<3 hours/day) Can patient physically be transported by private vehicle: No   Assistance Recommended at Discharge Frequent or constant Supervision/Assistance  Patient can return home with the following A lot of help with walking and/or transfers;A lot of help with bathing/dressing/bathroom;Assistance with cooking/housework;Direct supervision/assist for medications management;Assist for transportation;Direct supervision/assist for financial management   Equipment Recommendations  None recommended by PT    Recommendations for Other Services        Precautions / Restrictions Precautions Precautions: Fall Precaution Comments: confusion Restrictions Weight Bearing Restrictions: No     Mobility  Bed Mobility Overal bed mobility: Needs Assistance Bed Mobility: Supine to Sit     Supine to sit: Max assist, +2 for physical assistance     General bed mobility comments: pt requires initiation from therapists. Verbal cues for hand placement. Pt does assist with scooting hips to edge of bed but requires modA to complete    Transfers Overall transfer level: Needs assistance Equipment used: Rolling walker (2 wheels), 2 person hand held assist Transfers: Sit to/from Stand Sit to Stand: Max assist, +2 physical assistance, Mod assist   Step pivot transfers: Max assist, +2 physical assistance       General transfer comment: maxA x2 to stand with bilateral hand hold, improves to modA x2 to stand with use of RW initially. To step and pivot performance fluctuates from [place] x2 to maxA x2, pt often attempts to impulsively sit    Ambulation/Gait Ambulation/Gait assistance: Max assist Gait Distance (Feet): 4 Feet Assistive device: Rolling walker (2 wheels) Gait Pattern/deviations: Step-to pattern, Trunk flexed Gait velocity: reduced Gait velocity interpretation: <1.31 ft/sec, indicative of household ambulator   General Gait Details: pt with increased hip and trunk flexion, limited foot clearance bilaterally. PT assists with lateral weight shift at hips/trunk to aide in initiation of stepping   Stairs             Wheelchair Mobility    Modified Rankin (Stroke Patients Only)       Balance Overall balance assessment: Needs assistance Sitting-balance support: No upper extremity supported, Feet supported Sitting balance-Leahy Scale: Poor Sitting balance - Comments: minG-minA Postural control: Posterior lean Standing balance support: Bilateral upper extremity supported, Reliant on assistive device for  balance Standing balance-Leahy Scale: Poor Standing balance comment: modA, posterior lean  Cognition Arousal/Alertness: Awake/alert Behavior During Therapy: Impulsive (often impulsively attempts to sit) Overall Cognitive Status: Impaired/Different from baseline Area of Impairment: Orientation, Attention, Memory, Following commands, Safety/judgement, Awareness, Problem solving                 Orientation Level: Disoriented to, Time Current Attention Level: Focused Memory: Decreased recall of precautions, Decreased short-term memory Following Commands: Follows one step commands with increased time, Follows multi-step commands inconsistently Safety/Judgement: Decreased awareness of safety, Decreased awareness of deficits Awareness: Intellectual Problem Solving: Slow processing, Difficulty sequencing, Decreased initiation, Requires verbal cues, Requires tactile cues          Exercises      General Comments General comments (skin integrity, edema, etc.): VSS on RA      Pertinent Vitals/Pain Pain Assessment Pain Assessment: Faces Faces Pain Scale: Hurts even more Pain Location: abdomen Pain Descriptors / Indicators: Grimacing Pain Intervention(s): Monitored during session    Home Living                          Prior Function            PT Goals (current goals can now be found in the care plan section) Acute Rehab PT Goals Patient Stated Goal: return to walking and back to ILF- per daughter Progress towards PT goals: Progressing toward goals    Frequency    Min 3X/week      PT Plan Current plan remains appropriate    Co-evaluation PT/OT/SLP Co-Evaluation/Treatment: Yes Reason for Co-Treatment: Necessary to address cognition/behavior during functional activity;Complexity of the patient's impairments (multi-system involvement);For patient/therapist safety;To address functional/ADL transfers PT goals addressed  during session: Mobility/safety with mobility;Proper use of DME;Balance;Strengthening/ROM        AM-PAC PT "6 Clicks" Mobility   Outcome Measure  Help needed turning from your back to your side while in a flat bed without using bedrails?: A Lot Help needed moving from lying on your back to sitting on the side of a flat bed without using bedrails?: A Lot Help needed moving to and from a bed to a chair (including a wheelchair)?: A Lot Help needed standing up from a chair using your arms (e.g., wheelchair or bedside chair)?: A Lot Help needed to walk in hospital room?: Total Help needed climbing 3-5 steps with a railing? : Total 6 Click Score: 10    End of Session Equipment Utilized During Treatment: Gait belt Activity Tolerance: Patient tolerated treatment well Patient left: in chair;with call bell/phone within reach;with chair alarm set;with family/visitor present Nurse Communication: Mobility status;Need for lift equipment PT Visit Diagnosis: Other abnormalities of gait and mobility (R26.89);History of falling (Z91.81);Muscle weakness (generalized) (M62.81)     Time: 0930-1008 PT Time Calculation (min) (ACUTE ONLY): 38 min  Charges:  $Gait Training: 8-22 mins $Therapeutic Activity: 8-22 mins                     Zenaida Niece, PT, DPT Acute Rehabilitation Office [place] Kaeya Schiffer 05/03/2022, 10:19 AM

## 2022-05-03 NOTE — TOC Progression Note (Addendum)
Transition of Care [facility]) - Progression Note    Patient Details  Name: Sarah [name] MRN: [id number] Date of Birth: 1932/06/02  Transition of Care [facility]) CM/SW Contact  Ella Bodo, RN Phone Number: 05/03/2022, 3:39 PM  Clinical Narrative:    Patient has bed offers available for SNF.  Met with daughter, [name] to give bed offers, and she is not accepting of them. [facility] has declined and daughter not happy with her offers.  She is hesitant to have her faxed out to [facility], because she states she "needs to keep an eye on her," and she lives and works in [place].  Daughter frustrated with this process, and was tearful that she was having to make these difficult decisions. She mentioned [facility] in [place], and [facility] will call to check availability.  She was given SNF list with Medicare ratings; she agrees for patient to be faxed out to [facility], but no where else.  Will provide updates as available.   Addendum: 4:31pm Spoke with Leonette Nutting at [facility] at [place]: she states facility SNF was originally owned by [employer], and was closed in 2020.    Expected Discharge Plan: Skilled Nursing Facility Barriers to Discharge: Continued Medical Work up  Expected Discharge Plan and Services Expected Discharge Plan: [place]   Discharge Planning Services: CM Consult   Living arrangements for the past 2 months: Single Family Home                                       Social Determinants of Health (SDOH) Interventions    Readmission Risk Interventions    11/04/2021   11:19 AM  Readmission Risk Prevention Plan  Transportation Screening Complete  HRI or [place] Complete  Social Work Consult for [place] Planning/Counseling Complete  Palliative Care Screening Not Applicable   Reinaldo Raddle, RN, BSN  Trauma/Neuro ICU Case Manager [phone]

## 2022-05-03 NOTE — Progress Notes (Signed)
ANTICOAGULATION CONSULT NOTE - Follow Up Consult  Pharmacy Consult for heparin Indication: DVT  Labs: Recent Labs    05/01/22 0452 05/02/22 0314 05/03/22 0500  HGB 9.3* 9.2* 9.1*  HCT 28.6* 29.2* 27.3*  PLT 459* 494* 557*  HEPARINUNFRC 0.46 0.32 0.26*  CREATININE  --  0.57  --     Assessment: 89yo female subtherapeutic on heparin after several levels at goal though had been trending down; no infusion issues or signs of bleeding per RN.  Goal of Therapy:  Heparin level 0.3-0.7 units/ml   Plan:  Will increase heparin infusion by 2 units/kg/hr to 1000 units/hr and check level in 8 hours.    Wynona Neat, PharmD, BCPS  05/03/2022,5:47 AM

## 2022-05-03 NOTE — Evaluation (Signed)
Clinical/Bedside Swallow Evaluation Patient Details  Name: Sarah [name] MRN: [id number] Date of Birth: 1933-02-01  Today's Date: 05/03/2022 Time: SLP Start Time (ACUTE ONLY): 9563 SLP Stop Time (ACUTE ONLY): 8756 SLP Time Calculation (min) (ACUTE ONLY): 12 min  Past Medical History:  Past Medical History:  Diagnosis Date   Allergy    codeine, thiazides   Anxiety    new dx   Arrhythmia    Arthritis    Atrioventricular block, Mobitz type 1, Wenckebach    Breast cancer ([place]) 03/14/2012   bx=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,ER/PR=+,upper inner quad   Cancer ([place])    breast   Cataract    DVT (deep venous thrombosis) ([place]) 02/2019   left leg   Dyspnea    Edema    both legs feet and toe, abdomen   Glaucoma    laser treated years ago   HOH (hard of hearing)    Hyperlipemia    Hypertension    Hypothyroidism    Pancreatic cyst    benign   PONV (postoperative nausea and vomiting)    Pulmonary embolism ([place]) 02/2019   bilateral    Radiation 06/11/2012-07/12/2012   17 sessions 4250 cGy, 3 sessions 750 cGy   Vertigo    Wears glasses    Wears partial dentures    partial upper   Past Surgical History:  Past Surgical History:  Procedure Laterality Date   ABDOMINAL HYSTERECTOMY  1966   1/2 ovary left in    [place]   lumpectomy-lt   CATARACT EXTRACTION     b/l   COLONOSCOPY     EXCISION MASS NECK Left 03/17/2019    EXCISION MASS NECK (Left Neck)   EXCISION MASS NECK Left 03/17/2019   Procedure: EXCISION MASS NECK;  Surgeon: Helayne Seminole, MD;  Location: [facility];  Service: ENT;  Laterality: Left;   EYE SURGERY Bilateral    bilateral cataract removal   IR IMAGING GUIDED PORT INSERTION  04/23/2019   JOINT REPLACEMENT  2013   rt total knee   JOINT REPLACEMENT  1995   lt total knee   LAPAROTOMY N/A 04/23/2022   Procedure: EXPLORATORY LAPAROTOMY, SPLENECTOMY;  Surgeon: Coralie Keens, MD;  Location: [facility];  Service: General;   Laterality: N/A;   PACEMAKER IMPLANT N/A 11/01/2021   Procedure: PACEMAKER IMPLANT;  Surgeon: Vickie Epley, MD;  Location: [facility];  Service: Cardiovascular;  Laterality: N/A;   PARTIAL MASTECTOMY WITH NEEDLE LOCALIZATION  04/09/2012   Procedure: PARTIAL MASTECTOMY WITH NEEDLE LOCALIZATION;  Surgeon: Adin Hector, MD;  Location: [facility];  Service: General;  Laterality: Right;   SCALP LACERATION REPAIR N/A 04/23/2022   Procedure: SCALP LACERATION REPAIR;  Surgeon: Coralie Keens, MD;  Location: [facility];  Service: General;  Laterality: N/A;   SKIN BIOPSY Left 03/17/2019   LEFT THIGH   SKIN BIOPSY Left 03/17/2019   Procedure: Skin Biopsy Left Thigh;  Surgeon: Helayne Seminole, MD;  Location: [facility];  Service: ENT;  Laterality: Left;   TONSILLECTOMY     TOTAL KNEE ARTHROPLASTY  08/16/2011   Procedure: TOTAL KNEE ARTHROPLASTY;  Surgeon: Ninetta Lights, MD;  Location: [facility];  Service: Orthopedics;  Laterality: Right;   HPI:  88 yo female admitted 12/3 after fall with spleen lac s/p splenectomy, head lac. LUE DVT identified on 12/8. PMhx; lymphoma (in remission), dementia, hypothyroidism, HLD, PE on Eliquis, second-degree AV block (s/p pacemaker), CVA, thrombocytopenia, and recurrent falls  Assessment / Plan / Recommendation  Clinical Impression  Pt seen up in chair with daughter at her side. Daughter reports pt has a sore mouth, has not wanted to eat or drink and reports nothing tastes good. Pt has altered sleep pattern has either been unable to sleep or sleep for too long. Today pt is minimally alert; will briefly communicate and follow commands with repetition from daughter. When given a bite of ice pt does not orally manipulate it, appears to let it fall posteriorally and swallow is elicited. Pt did take one sip of ensure. SLP offered to perform oral care and pt refused. Then started sleeping more deeply. Advised RN and daughter to because cautious with PO  as her arousal is really not appropriate for total assisted feeding. Daughter concerned about oral hydration and UTI; RN reports pt takes pills with difficulty. Pts situation very tenuous. Risk of aspiration present though pt can still toerlate PO if fed with caution when alert. Will f/u for tolerance and advancement. SLP Visit Diagnosis: Dysphagia, unspecified (R13.10)    Aspiration Risk  Moderate aspiration risk;Risk for inadequate nutrition/hydration    Diet Recommendation Thin liquid   Liquid Administration via: Cup;Straw Medication Administration: Crushed with puree    Other  Recommendations      Recommendations for follow up therapy are one component of a multi-disciplinary discharge planning process, led by the attending physician.  Recommendations may be updated based on patient status, additional functional criteria and insurance authorization.  Follow up Recommendations Skilled nursing-short term rehab (<3 hours/day)      Assistance Recommended at Discharge    Functional Status Assessment Patient has had a recent decline in their functional status and demonstrates the ability to make significant improvements in function in a reasonable and predictable amount of time.  Frequency and Duration min 2x/week          Prognosis        Swallow Study   General HPI: 88 yo female admitted 12/3 after fall with spleen lac s/p splenectomy, head lac. LUE DVT identified on 12/8. PMhx; lymphoma (in remission), dementia, hypothyroidism, HLD, PE on Eliquis, second-degree AV block (s/p pacemaker), CVA, thrombocytopenia, and recurrent falls Type of Study: Bedside Swallow Evaluation Diet Prior to this Study: Dysphagia 3 (soft);Thin liquids Temperature Spikes Noted: No Respiratory Status: Room air History of Recent Intubation: No Behavior/Cognition: Lethargic/Drowsy;Requires cueing Oral Cavity Assessment: Dry Oral Care Completed by SLP: No (Pt would not allow it) Oral Cavity - Dentition:  Edentulous Self-Feeding Abilities: Total assist Patient Positioning: Upright in chair    Oral/Motor/Sensory Function     Ice Chips Ice chips: Impaired Oral Phase Impairments: Impaired mastication;Reduced labial seal;Poor awareness of bolus   Thin Liquid Thin Liquid: Within functional limits    Nectar Thick     Honey Thick     Puree     Solid           Herbie Baltimore, MA CCC-SLP  Acute Rehabilitation Services Secure Chat Preferred Office [phone]  Lynann Beaver 05/03/2022,3:15 PM

## 2022-05-03 NOTE — Progress Notes (Signed)
Occupational Therapy Treatment Patient Details Name: Sarah [name] MRN: [id number] DOB: [birth year] Today's Date: 05/03/2022   History of present illness 88 yo female admitted 12/3 after fall with spleen lac s/p splenectomy, head lac. LUE DVT identified on 12/8. PMhx; lymphoma (in remission), dementia, hypothyroidism, HLD, PE on Eliquis, second-degree AV block (s/p pacemaker), CVA, thrombocytopenia, and recurrent falls   OT comments  Pt continues to have pain and be resistant to some activity. Pt kept eyes closed through most of session today but did participate.  Pt requires mod to max assist for most mobility because of a heavy posterior lean and fear of standing up straight/standing at all.  Attempted to stand in bathroom with sink in front of her and chair behind to give more support but pt continued to impulsively sit.  Will continue to work acutely with pt with focus on standing balance during adls.   Recommendations for follow up therapy are one component of a multi-disciplinary discharge planning process, led by the attending physician.  Recommendations may be updated based on patient status, additional functional criteria and insurance authorization.    Follow Up Recommendations  Skilled nursing-short term rehab (<3 hours/day)     Assistance Recommended at Discharge Frequent or constant Supervision/Assistance  Patient can return home with the following  Two people to help with walking and/or transfers;Two people to help with bathing/dressing/bathroom;Assistance with cooking/housework;Direct supervision/assist for medications management;Direct supervision/assist for financial management;Assist for transportation;Help with stairs or ramp for entrance   Equipment Recommendations       Recommendations for Other Services      Precautions / Restrictions Precautions Precautions: Fall Precaution Comments: confusion Restrictions Weight Bearing Restrictions: No       Mobility Bed  Mobility Overal bed mobility: Needs Assistance Bed Mobility: Supine to Sit   Sidelying to sit: Max assist, HOB elevated       General bed mobility comments: pt requires initiation from therapists. Verbal cues for hand placement. Pt does assist with scooting hips to edge of bed but requires modA to complete    Transfers Overall transfer level: Needs assistance Equipment used: Rolling walker (2 wheels), 2 person hand held assist Transfers: Sit to/from Stand Sit to Stand: Max assist, +2 physical assistance Stand pivot transfers: +2 physical assistance, Mod assist         General transfer comment: Pt with great difficulty standing straight and getting weight off her heels. At times, pt requires max assist but at times was at mod assist level. Pt very kyphotic over walker and also had eyes closed while moving.     Balance Overall balance assessment: Needs assistance Sitting-balance support: No upper extremity supported, Feet supported Sitting balance-Leahy Scale: Poor   Postural control: Posterior lean Standing balance support: Bilateral upper extremity supported, Reliant on assistive device for balance Standing balance-Leahy Scale: Poor Standing balance comment: modA, posterior lean                           ADL either performed or assessed with clinical judgement   ADL Overall ADL's : Needs assistance/impaired     Grooming: Maximal assistance;Standing Grooming Details (indicate cue type and reason): attempted grooming at sink in standing in front of sink. Pt has the strength to stand but as soon as pt is up for more than a few seconds, pt asks to sit and impulsively pushes herself back into the chair.  Toilet Transfer: Moderate assistance;+2 for physical assistance;Stand-pivot;BSC/3in1;Rolling walker (2 wheels) Toilet Transfer Details (indicate cue type and reason): Once up to walker, pt able to pivot to [facility]. Pt kyphotic over walker but on  second attempt was able to get some weight on the balls of her feet to transfer .         Functional mobility during ADLs: Moderate assistance;+2 for physical assistance;Rolling walker (2 wheels) General ADL Comments: Pt focused so much on mobilty and standing that it is difficult to engage in adls on her feet.  Focused most on erect standing and attempting to decrease anxiety around standing so adls will be easier once calmer when on feet.    Extremity/Trunk Assessment Upper Extremity Assessment Upper Extremity Assessment: LUE deficits/detail LUE Deficits / Details: L UE edema, grossly 2/5 MMT. PROM WFL to 90* FF AROM to 45* FF, hand grasp limited by edema. LUE Sensation: WNL LUE Coordination: decreased fine motor;decreased gross motor   Lower Extremity Assessment Lower Extremity Assessment: Defer to PT evaluation        Vision   Vision Assessment?:  (Pt with eyes closed during session) Additional Comments: Pt has glasses. Glasses donned but pt spent 85% of session with eyes closed.   Perception     Praxis      Cognition Arousal/Alertness: Awake/alert Behavior During Therapy: Anxious Overall Cognitive Status: Impaired/Different from baseline Area of Impairment: Orientation, Attention, Memory, Following commands, Safety/judgement, Awareness, Problem solving                 Orientation Level: Disoriented to, Time Current Attention Level: Focused Memory: Decreased recall of precautions, Decreased short-term memory Following Commands: Follows one step commands with increased time, Follows multi-step commands inconsistently Safety/Judgement: Decreased awareness of safety, Decreased awareness of deficits Awareness: Intellectual Problem Solving: Slow processing, Difficulty sequencing, Decreased initiation, Requires verbal cues, Requires tactile cues General Comments: Pt spent most of session with eyes closed despite being asked to keep them open. Pt mildly resistant to  activity but did participate with much encouragement        Exercises      Shoulder Instructions       General Comments Pt requires much encouragement to participate.  Pt with eyes closed much of session and resitant to movement into standing position but did agree.    Pertinent Vitals/ Pain       Pain Assessment Pain Assessment: Faces Faces Pain Scale: Hurts even more Pain Location: abdomen Pain Descriptors / Indicators: Grimacing Pain Intervention(s): Limited activity within patient's tolerance, Monitored during session, Repositioned, Relaxation  Home Living                                          Prior Functioning/Environment              Frequency  Min 2X/week        Progress Toward Goals  OT Goals(current goals can now be found in the care plan section)  Progress towards OT goals: Progressing toward goals  Acute Rehab OT Goals Patient Stated Goal: none stated OT Goal Formulation: With patient Time For Goal Achievement: 05/09/22 Potential to Achieve Goals: Fair ADL Goals Pt Will Perform Grooming: with set-up;sitting Pt Will Perform Upper Body Bathing: with set-up;sitting Pt Will Transfer to Toilet: with mod assist;stand pivot transfer;bedside commode Pt/caregiver will Perform Home Exercise Program: Increased strength;Left upper extremity Additional ADL Goal #1: Pt  will follow and complete 2 step ADL task with increased time.  Plan Discharge plan remains appropriate    Co-evaluation    PT/OT/SLP Co-Evaluation/Treatment: Yes Reason for Co-Treatment: Complexity of the patient's impairments (multi-system involvement);Necessary to address cognition/behavior during functional activity;For patient/therapist safety;To address functional/ADL transfers PT goals addressed during session: Mobility/safety with mobility OT goals addressed during session: ADL's and self-care      AM-PAC OT "6 Clicks" Daily Activity     Outcome Measure   Help  from another person eating meals?: A Little Help from another person taking care of personal grooming?: A Lot Help from another person toileting, which includes using toliet, bedpan, or urinal?: A Lot Help from another person bathing (including washing, rinsing, drying)?: A Lot Help from another person to put on and taking off regular upper body clothing?: A Lot Help from another person to put on and taking off regular lower body clothing?: Total 6 Click Score: 12    End of Session Equipment Utilized During Treatment: Gait belt;Rolling walker (2 wheels)  OT Visit Diagnosis: Other abnormalities of gait and mobility (R26.89);History of falling (Z91.81);Muscle weakness (generalized) (M62.81);Pain Pain - part of body:  (abdomen)   Activity Tolerance Patient tolerated treatment well   Patient Left in chair;with call bell/phone within reach;with chair alarm set;with family/visitor present   Nurse Communication Mobility status        Time: 4920-1007 OT Time Calculation (min): 32 min  Charges: OT General Charges $OT Visit: 1 Visit OT Treatments $Self Care/Home Management : 8-22 mins   Glenford Peers 05/03/2022, 10:35 AM

## 2022-05-03 NOTE — Progress Notes (Signed)
Patient is requesting sleep aid, scheduled Ambien did not help.   TRAUMA paged.

## 2022-05-03 NOTE — Progress Notes (Signed)
ANTICOAGULATION CONSULT NOTE - Follow Up Consult  Pharmacy Consult for Heparin Indication:  new LUE DVT  Patient Measurements: Height: 5\' 4"  (162.6 cm) Weight: 50.7 kg (111 lb 12.4 oz) IBW/kg (Calculated) : 54.7 Heparin Dosing Weight: 50.7 kg  Labs: Recent Labs    05/01/22 0452 05/02/22 0314 05/03/22 0500 05/03/22 1437  HGB 9.3* 9.2* 9.1*  --   HCT 28.6* 29.2* 27.3*  --   PLT 459* 494* 557*  --   HEPARINUNFRC 0.46 0.32 0.26* 0.42  CREATININE  --  0.57  --   --      Estimated Creatinine Clearance: 39.7 mL/min (by C-G formula based on SCr of 0.57 mg/dL).  Assessment: 70 YOF with hx of lymphoma, DVT/PE on apixaban PTA presenting with ruptured spleen, s/p splenectomy 12/3, now with ileus post-op. Apixaban was reversed on admission prior to procedure with Andexxa. Patient found to have new LUE DVT. Pharmacy consulted to start heparin with no bolus. Last dose of apixaban was 12/3 AM PTA and last dose of Lovenox 30mg  [place] Y72L inpatient given 12/8 AM.   Heparin level therapeutic this PM. We will cont with current rate and check level in AM  Goal of Therapy:  Heparin level 0.3-0.7 units/ml Monitor platelets by anticoagulation protocol: Yes   Plan:  Continue heparin drip 1000 units/hr Monitor daily heparin level and CBC, s/sx bleeding   Onnie Boer, PharmD, BCIDP, AAHIVP, CPP Infectious Disease Pharmacist 05/03/2022 3:42 PM

## 2022-05-04 ENCOUNTER — Telehealth: Payer: Self-pay

## 2022-05-04 DIAGNOSIS — I442 Atrioventricular block, complete: Secondary | ICD-10-CM

## 2022-05-04 LAB — URINE CULTURE: Culture: >=100000 — AB

## 2022-05-04 LAB — BASIC METABOLIC PANEL
Anion gap: 7 (ref 5–15)
BUN: 12 mg/dL (ref 8–23)
CO2: 24 mmol/L (ref 22–32)
Calcium: 8.5 mg/dL — ABNORMAL LOW (ref 8.9–10.3)
Chloride: 110 mmol/L (ref 98–111)
Creatinine, Ser: 0.66 mg/dL (ref 0.44–1.00)
GFR, Estimated: >60 mL/min (ref >60)
Glucose, Bld: 101 mg/dL — ABNORMAL HIGH (ref 70–99)
Potassium: 3.7 mmol/L (ref 3.5–5.1)
Sodium: 141 mmol/L (ref 135–145)

## 2022-05-04 LAB — CBC
HCT: 27.6 % — ABNORMAL LOW (ref 36.0–46.0)
Hemoglobin: 8.8 g/dL — ABNORMAL LOW (ref 12.0–15.0)
MCH: 29.6 pg (ref 26.0–34.0)
MCHC: 31.9 g/dL (ref 30.0–36.0)
MCV: 92.9 fL (ref 80.0–100.0)
Platelets: 581 10*3/uL — ABNORMAL HIGH (ref 150–400)
RBC: 2.97 MIL/uL — ABNORMAL LOW (ref 3.87–5.11)
RDW: 14.6 % (ref 11.5–15.5)
WBC: 13.9 10*3/uL — ABNORMAL HIGH (ref 4.0–10.5)
nRBC: 1.7 % — ABNORMAL HIGH (ref 0.0–0.2)

## 2022-05-04 LAB — GROUP A STREP BY PCR: Group A Strep by PCR: NOT DETECTED

## 2022-05-04 LAB — HEPARIN LEVEL (UNFRACTIONATED): Heparin Unfractionated: 0.46 IU/mL (ref 0.30–0.70)

## 2022-05-04 MED ORDER — ACETAMINOPHEN 650 MG RE SUPP: 650.0000 mg | Freq: Four times a day (QID) | RECTAL | Status: DC | PRN

## 2022-05-04 MED ORDER — HYDRALAZINE HCL 20 MG/ML IJ SOLN
5.0000 mg | Freq: Four times a day (QID) | INTRAMUSCULAR | Status: DC | PRN
  Administered 2022-05-04: 5 mg via INTRAVENOUS
  Filled 2022-05-04: qty 1

## 2022-05-04 MED ORDER — LACTATED RINGERS IV SOLN: INTRAVENOUS | Status: DC

## 2022-05-04 MED ORDER — PHENOL 1.4 % MT LIQD
1.0000 | OROMUCOSAL | Status: DC | PRN
  Filled 2022-05-04: qty 177

## 2022-05-04 MED ORDER — ACETAMINOPHEN 325 MG PO TABS: 650.0000 mg | ORAL_TABLET | Freq: Four times a day (QID) | ORAL | Status: DC | PRN

## 2022-05-04 NOTE — Progress Notes (Signed)
[facility] Progress Note  11 Days Post-Op  Subjective: CC-  Daughter at bedside. Patient very drowsy this morning. Briefly wakes up and responds but then  falls back asleep. Per report she did sleep well last night.   Objective: Vital signs in last 24 hours: Temp:  [97.7 F (36.5 C)-98.6 F (37 C)] 98.4 F (36.9 C) (12/14 0747) Pulse Rate:  [61-71] 64 (12/14 0747) Resp:  [12-20] 15 (12/14 0747) BP: (107-167)/(68-95) 167/75 (12/14 0747) SpO2:  [91 %-95 %] 92 % (12/14 0747) Last BM Date : 05/03/22  Intake/Output from previous day: 12/13 0701 - 12/14 0700 In: 385.3 [P.O.:150; I.V.:235.3] Out: 600 [Urine:600] Intake/Output this shift: No intake/output data recorded.  PE: Gen:  Drowsy, intermittently responsive HEENT: scalp lac cdi with staples present and no signs of infection Card:  RRR Pulm:  CTAB, no W/R/R, rate and effort normal on room air Abd: Soft, NT/ND, +BS, midline incision cdi with staples present and no signs of infection Ext:  persistent edema present in the LUE  Lab Results:  Recent Labs    05/03/22 0500 05/04/22 0515  WBC 12.6* 13.9*  HGB 9.1* 8.8*  HCT 27.3* 27.6*  PLT 557* 581*   BMET Recent Labs    05/02/22 0314 05/04/22 0515  NA 141 141  K 3.2* 3.7  CL 109 110  CO2 23 24  GLUCOSE 109* 101*  BUN 12 12  CREATININE 0.57 0.66  CALCIUM 8.4* 8.5*   PT/INR No results for input(s): "LABPROT", "INR" in the last 72 hours. CMP     Component Value Date/Time   NA 141 05/04/2022 0515   NA 145 (H) 10/26/2021 1117   NA 141 07/25/2016 1053   K 3.7 05/04/2022 0515   K 3.6 07/25/2016 1053   CL 110 05/04/2022 0515   CL 106 04/25/2012 1606   CO2 24 05/04/2022 0515   CO2 27 07/25/2016 1053   GLUCOSE 101 (H) 05/04/2022 0515   GLUCOSE 102 07/25/2016 1053   GLUCOSE 114 (H) 04/25/2012 1606   BUN 12 05/04/2022 0515   BUN 19 10/26/2021 1117   BUN 15.5 07/25/2016 1053   CREATININE 0.66 05/04/2022 0515   CREATININE 0.68 02/08/2022 1349    CREATININE 0.8 07/25/2016 1053   CALCIUM 8.5 (L) 05/04/2022 0515   CALCIUM 10.1 07/25/2016 1053   PROT 5.3 (L) 04/29/2022 0731   PROT 7.5 07/25/2016 1053   ALBUMIN 2.5 (L) 04/29/2022 0731   ALBUMIN 4.3 07/25/2016 1053   AST 29 04/29/2022 0731   AST 22 02/08/2022 1349   AST 17 07/25/2016 1053   ALT 20 04/29/2022 0731   ALT 15 02/08/2022 1349   ALT 11 07/25/2016 1053   ALKPHOS 66 04/29/2022 0731   ALKPHOS 99 07/25/2016 1053   BILITOT 0.7 04/29/2022 0731   BILITOT 0.7 02/08/2022 1349   BILITOT 0.61 07/25/2016 1053   GFRNONAA >60 05/04/2022 0515   GFRNONAA >60 02/08/2022 1349   GFRAA >60 02/10/2020 0855   Lipase  No results found for: "LIPASE"     Studies/Results: CUP PACEART REMOTE DEVICE CHECK  Result Date: 05/03/2022 Scheduled remote reviewed. Normal device function.  Next remote 91 days. LA   Anti-infectives: Anti-infectives (From admission, onward)    Start     Dose/Rate Route Frequency Ordered Stop   05/02/22 2200  nitrofurantoin (macrocrystal-monohydrate) (MACROBID) capsule 100 mg        100 mg Oral Every 12 hours 05/02/22 1529 05/09/22 2159        Assessment/Plan GLF  Grade 4 spleen lac with active extravasation - POD#11 S/P splenectomy by Dr. Ninfa Linden 12/3, ileus improved, NG out and was started on a diet. Plan vaccines prior to D/C. Remove staples 12/15. Path negative. Scalp lac - repaired in the OR, will need staples removed 12/15 Hx DVT/PE on Eliquis - reversed, duplex BLE negative for DVT; LUE 12/8  +DVT - see below ABL anemia - hgb 8.8, stable HTN - stop scheduled IV metop and resume home Coreg and Norvasc. Too drowsy to take PO this morning, added IV hydralazine PRN PMH lymphoma  PMH pacemaker placement  Delirium - waxes and wanes  UTI - Ucx Enterococcus faecalis. Continue nitrofurantoin x7 days   VTE - LUE DVT, hgb stable and hep gtt therapeutic, too drowsy for PO this morning but will plan to transition to 5mg  BID Eliquis when able FEN -  IVF@75cc /hr, working with SLP - rec thin liquids if fed with caution when alert. Discussed administering meds/nutrition via alternative means (ie Cortrak) and patient's daughter declines ID: Macrobid for UTI as above. She is afebrile, monitor leukocytosis which is likely secondary to asplenia.   Dispo - 4NP, PT/OT/SLP  I reviewed last 24 h vitals and pain scores, last 48 h intake and output, last 24 h labs and trends    LOS: 11 days    Wellington Hampshire, [facility] 05/04/2022, 8:52 AM Please see Amion for pager number during day hours 7:00am-4:30pm

## 2022-05-04 NOTE — Telephone Encounter (Signed)
Left message (okay per DPR) for daughter [name].   Received message from [facility] that they have not received a transmission from her device.   Patient has recently fallen and has been in the hospital since 04/23/22. She currently is awaiting discharge to a skilled nursing facility.   I did leave message for daughter extending our get well wishes and letting her know to reconnect the remote device once her mother gets settled in her new room at the SNF.    Daugther given our number to call back if she has any questions or concerns.

## 2022-05-04 NOTE — Progress Notes (Signed)
ANTICOAGULATION CONSULT NOTE - Follow Up Consult  Pharmacy Consult for Heparin Indication:  new LUE DVT  Patient Measurements: Height: 5\' 4"  (162.6 cm) Weight: 50.7 kg (111 lb 12.4 oz) IBW/kg (Calculated) : 54.7 Heparin Dosing Weight: 50.7 kg  Labs: Recent Labs    05/02/22 0314 05/03/22 0500 05/03/22 1437 05/04/22 0515  HGB 9.2* 9.1*  --  8.8*  HCT 29.2* 27.3*  --  27.6*  PLT 494* 557*  --  581*  HEPARINUNFRC 0.32 0.26* 0.42 0.46  CREATININE 0.57  --   --  0.66     Estimated Creatinine Clearance: 39.7 mL/min (by C-G formula based on SCr of 0.66 mg/dL).  Assessment: 71 YOF with hx of lymphoma, DVT/PE on apixaban PTA presenting with ruptured spleen, s/p splenectomy 12/3, now with ileus post-op. Apixaban was reversed on admission prior to procedure with Andexxa. Patient found to have new LUE DVT. Pharmacy consulted to start heparin with no bolus. Last dose of apixaban was 12/3 AM PTA and last dose of Lovenox 30mg  [place] q12h inpatient given 12/8 AM.   Heparin level this morning therapeutic at 0.46. H & H stable.   Goal of Therapy:  Heparin level 0.3-0.7 units/ml Monitor platelets by anticoagulation protocol: Yes   Plan:  Continue heparin drip 1000 units/hr.  Monitor daily heparin level and CBC, s/sx bleeding  Esmeralda Arthur, PharmD, BCCCP  05/04/2022 9:52 AM

## 2022-05-04 NOTE — TOC Progression Note (Signed)
Transition of Care [facility]) - Progression Note    Patient Details  Name: Sarah [name] MRN: [id number] Date of Birth: [birth year]  Transition of Care [facility]) CM/SW Contact  Ella Bodo, RN Phone Number: 05/04/2022, 4:25 PM  Clinical Narrative:    Left message for patient's daughter to discuss additional bed offer of [facility].  Will await return call.    Expected Discharge Plan: Skilled Nursing Facility Barriers to Discharge: Continued Medical Work up  Expected Discharge Plan and Services Expected Discharge Plan: [place]   Discharge Planning Services: CM Consult   Living arrangements for the past 2 months: Single Family Home                                       Social Determinants of Health (SDOH) Interventions    Readmission Risk Interventions    11/04/2021   11:19 AM  Readmission Risk Prevention Plan  Transportation Screening Complete  HRI or [place] Complete  Social Work Consult for [place] Planning/Counseling Complete  Palliative Care Screening Not Applicable   Reinaldo Raddle, RN, BSN  Trauma/Neuro ICU Case Manager [phone]

## 2022-05-04 NOTE — Progress Notes (Signed)
Speech Language Pathology Treatment: Dysphagia  Patient Details Name: AHLIYAH NIENOW MRN: [id number] DOB: 18-Jul-1932 Today's Date: 05/04/2022 Time: 1120-1130 SLP Time Calculation (min) (ACUTE ONLY): 10 min  Assessment / Plan / Recommendation Clinical Impression  Pt more alert today, following commands. Still able to take ice and swallow but swallow elicits severe pain and grimacing and limits pts oral intake. SLP examined pts throat and found ulcers on her posterior oropharyngeal wall. Reported to RN and MD. Suggested humidified medical air and further deferred to MD. Currently there is no role for SLP as pt does have difficulty swallowing, but cause is outside scope of practice and no further SLP interventions are expected to benefit the pt. Will sign off.   HPI HPI: 88 yo female admitted 12/3 after fall with spleen lac s/p splenectomy, head lac. LUE DVT identified on 12/8. PMhx; lymphoma (in remission), dementia, hypothyroidism, HLD, PE on Eliquis, second-degree AV block (s/p pacemaker), CVA, thrombocytopenia, and recurrent falls      SLP Plan         Recommendations for follow up therapy are one component of a multi-disciplinary discharge planning process, led by the attending physician.  Recommendations may be updated based on patient status, additional functional criteria and insurance authorization.    Recommendations  Diet recommendations: Thin liquid;Dysphagia 3 (mechanical soft) Medication Administration: Crushed with puree                Follow Up Recommendations: Skilled nursing-short term rehab (<3 hours/day) SLP Visit Diagnosis: Dysphagia, unspecified (R13.10)           Noboru Bidinger, Katherene Ponto  05/04/2022, 11:41 AM

## 2022-05-04 NOTE — Progress Notes (Signed)
Pt with increased drowsiness responds to voice for short periods before falling back asleep. BP 167/75 P 64 T 98.4 SpO2 95% RA. Pt does not stay awake long enough to safely swallow medications. PA-C paged for IV BP meds and IV fluids. See new orders.

## 2022-05-05 LAB — BASIC METABOLIC PANEL
Anion gap: 10 (ref 5–15)
BUN: 14 mg/dL (ref 8–23)
CO2: 21 mmol/L — ABNORMAL LOW (ref 22–32)
Calcium: 8.7 mg/dL — ABNORMAL LOW (ref 8.9–10.3)
Chloride: 109 mmol/L (ref 98–111)
Creatinine, Ser: 0.73 mg/dL (ref 0.44–1.00)
GFR, Estimated: >60 mL/min (ref >60)
Glucose, Bld: 92 mg/dL (ref 70–99)
Potassium: 4.2 mmol/L (ref 3.5–5.1)
Sodium: 140 mmol/L (ref 135–145)

## 2022-05-05 LAB — CBC
HCT: 29 % — ABNORMAL LOW (ref 36.0–46.0)
Hemoglobin: 9.1 g/dL — ABNORMAL LOW (ref 12.0–15.0)
MCH: 29.1 pg (ref 26.0–34.0)
MCHC: 31.4 g/dL (ref 30.0–36.0)
MCV: 92.7 fL (ref 80.0–100.0)
Platelets: 484 10*3/uL — ABNORMAL HIGH (ref 150–400)
RBC: 3.13 MIL/uL — ABNORMAL LOW (ref 3.87–5.11)
RDW: 14.7 % (ref 11.5–15.5)
WBC: 12.1 10*3/uL — ABNORMAL HIGH (ref 4.0–10.5)
nRBC: 1.7 % — ABNORMAL HIGH (ref 0.0–0.2)

## 2022-05-05 LAB — HEPARIN LEVEL (UNFRACTIONATED): Heparin Unfractionated: 0.28 IU/mL — ABNORMAL LOW (ref 0.30–0.70)

## 2022-05-05 MED ORDER — APIXABAN 5 MG PO TABS
5.0000 mg | ORAL_TABLET | Freq: Two times a day (BID) | ORAL | Status: DC
  Administered 2022-05-05 – 2022-05-06 (×2): 5 mg via ORAL
  Filled 2022-05-05 (×2): qty 1

## 2022-05-05 MED ORDER — ACETAMINOPHEN 325 MG PO TABS
650.0000 mg | ORAL_TABLET | Freq: Four times a day (QID) | ORAL | Status: DC | PRN
  Administered 2022-05-05: 650 mg via ORAL
  Filled 2022-05-05: qty 2

## 2022-05-05 NOTE — Progress Notes (Signed)
ANTICOAGULATION CONSULT NOTE - Follow Up Consult  Pharmacy Consult for Heparin>>apixaban Indication:  new LUE DVT  Patient Measurements: Height: 5\' 4"  (162.6 cm) Weight: 50.7 kg (111 lb 12.4 oz) IBW/kg (Calculated) : 54.7 Heparin Dosing Weight: 50.7 kg  Labs: Recent Labs    05/03/22 0500 05/03/22 1437 05/04/22 0515 05/05/22 0505  HGB 9.1*  --  8.8* 9.1*  HCT 27.3*  --  27.6* 29.0*  PLT 557*  --  581* 484*  HEPARINUNFRC 0.26* 0.42 0.46 0.28*  CREATININE  --   --  0.66 0.73     Estimated Creatinine Clearance: 39.7 mL/min (by C-G formula based on SCr of 0.73 mg/dL).  Assessment: 86 YOF with hx of lymphoma, DVT/PE on apixaban PTA presenting with ruptured spleen, s/p splenectomy 12/3, now with ileus post-op. Apixaban was reversed on admission prior to procedure with Andexxa. Patient found to have new LUE DVT. Pharmacy consulted to start heparin with no bolus. Last dose of apixaban was 12/3 AM PTA and last dose of Lovenox 30mg  [place] q12h inpatient given 12/8 AM.   Heparin level this morning subtherapeutic at 0.28. H & H stable.   Plan to transition to PO apixaban this PM for DVT. She has received 7d of parenteral anticoagulation so we will skip the loading phase.  Goal of Therapy:  Monitor platelets by anticoagulation protocol: Yes   Plan:  Dc heparin Apixaban 5mg  PO BID Rx will follow peripherally  Onnie Boer, PharmD, BCIDP, AAHIVP, CPP Infectious Disease Pharmacist 05/05/2022 4:19 PM

## 2022-05-05 NOTE — Progress Notes (Signed)
Physical Therapy Treatment Patient Details Name: Sarah [name] MRN: [id number] DOB: 04-Jul-1932 Today's Date: 05/05/2022   History of Present Illness 88 yo female admitted 12/3 after fall with spleen lac s/p splenectomy, head lac. LUE DVT identified on 12/8. PMhx; lymphoma (in remission), dementia, hypothyroidism, HLD, PE on Eliquis, second-degree AV block (s/p pacemaker), CVA, thrombocytopenia, and recurrent falls    PT Comments    Pt continues to demonstrate strong posterior lean with increased hip flexion in standing. During one standing attempt PT is able to calm the patient some and provide cues to extend hips and trunk, with improvement in posture and reduced posterior lean, however this lasts briefly before the pt becomes anxious about falling again. PT provides significant encouragement and cueing to improve transfer quality. Pt remains at a high risk for falls and PT continues to recommend SNF placement.   Recommendations for follow up therapy are one component of a multi-disciplinary discharge planning process, led by the attending physician.  Recommendations may be updated based on patient status, additional functional criteria and insurance authorization.  Follow Up Recommendations  Skilled nursing-short term rehab (<3 hours/day) Can patient physically be transported by private vehicle: No   Assistance Recommended at Discharge Frequent or constant Supervision/Assistance  Patient can return home with the following A lot of help with walking and/or transfers;A lot of help with bathing/dressing/bathroom;Assistance with cooking/housework;Direct supervision/assist for medications management;Assist for transportation;Direct supervision/assist for financial management   Equipment Recommendations  None recommended by PT    Recommendations for Other Services       Precautions / Restrictions Precautions Precautions: Fall Precaution Comments: confusion Restrictions Weight Bearing  Restrictions: No     Mobility  Bed Mobility Overal bed mobility: Needs Assistance Bed Mobility: Supine to Sit     Supine to sit: Min assist, HOB elevated     General bed mobility comments: use of rails, assist to scoot hips forward    Transfers Overall transfer level: Needs assistance Equipment used: Rolling walker (2 wheels) Transfers: Sit to/from Stand, Bed to chair/wheelchair/BSC Sit to Stand: Max assist, From elevated surface   Step pivot transfers: Max assist       General transfer comment: strong posterior lean, pt performs 4 sit to stand transfers during session with PT emphasizing increased trunk flexion and foot placement. Pt with difficulty moving center of mass over base of support, often positioned in hip flexion with buttocks displaced posteriorly    Ambulation/Gait Ambulation/Gait assistance: Max assist Gait Distance (Feet): 2 Feet Assistive device: Rolling walker (2 wheels) Gait Pattern/deviations: Leaning posteriorly Gait velocity: reduced Gait velocity interpretation: <1.31 ft/sec, indicative of household ambulator   General Gait Details: short shuffling steps with posterior lean, increased difficulty stepping with left foot due to left lateral lean onto PT   Stairs             Wheelchair Mobility    Modified Rankin (Stroke Patients Only)       Balance Overall balance assessment: Needs assistance Sitting-balance support: No upper extremity supported, Feet supported Sitting balance-Leahy Scale: Fair     Standing balance support: Bilateral upper extremity supported, Reliant on assistive device for balance Standing balance-Leahy Scale: Poor Standing balance comment: mod-maxA, posterior lean                            Cognition Arousal/Alertness: Awake/alert Behavior During Therapy: Anxious Overall Cognitive Status: Impaired/Different from baseline Area of Impairment: Safety/judgement, Awareness, Problem solving  Safety/Judgement: Decreased awareness of safety, Decreased awareness of deficits Awareness: Emergent Problem Solving: Difficulty sequencing          Exercises      General Comments General comments (skin integrity, edema, etc.): VSS on RA      Pertinent Vitals/Pain Pain Assessment Pain Assessment: Faces Faces Pain Scale: Hurts little more Pain Location: abdomen Pain Descriptors / Indicators: Sore Pain Intervention(s): Monitored during session    Home Living                          Prior Function            PT Goals (current goals can now be found in the care plan section) Acute Rehab PT Goals Patient Stated Goal: return to walking and back to ILF- per daughter Progress towards PT goals: Progressing toward goals    Frequency    Min 3X/week      PT Plan Current plan remains appropriate    Co-evaluation              AM-PAC PT "6 Clicks" Mobility   Outcome Measure  Help needed turning from your back to your side while in a flat bed without using bedrails?: A Lot Help needed moving from lying on your back to sitting on the side of a flat bed without using bedrails?: A Lot Help needed moving to and from a bed to a chair (including a wheelchair)?: A Lot Help needed standing up from a chair using your arms (e.g., wheelchair or bedside chair)?: A Lot Help needed to walk in hospital room?: Total Help needed climbing 3-5 steps with a railing? : Total 6 Click Score: 10    End of Session   Activity Tolerance: Patient tolerated treatment well Patient left: in chair;with call bell/phone within reach;with chair alarm set;with family/visitor present Nurse Communication: Mobility status PT Visit Diagnosis: Other abnormalities of gait and mobility (R26.89);History of falling (Z91.81);Muscle weakness (generalized) (M62.81)     Time: 1021-1173 PT Time Calculation (min) (ACUTE ONLY): 27 min  Charges:  $Therapeutic Activity:  23-37 mins                     Zenaida Niece, PT, DPT Acute Rehabilitation Office [place] Damar Petit 05/05/2022, 9:29 AM

## 2022-05-05 NOTE — Progress Notes (Signed)
Trauma/Critical Care Follow Up Note  Subjective:    Overnight Issues:   Objective:  Vital signs for last 24 hours: Temp:  [98.1 F (36.7 C)-99.1 F (37.3 C)] 98.1 F (36.7 C) (12/15 0731) Pulse Rate:  [64-87] 87 (12/15 0731) Resp:  [13-19] 17 (12/15 0731) BP: (123-157)/(64-88) 157/88 (12/15 0731) SpO2:  [90 %-97 %] 90 % (12/15 0731)  Hemodynamic parameters for last 24 hours:    Intake/Output from previous day: 12/14 0701 - 12/15 0700 In: 689.4 [I.V.:689.4] Out: 1200 [Urine:1200]  Intake/Output this shift: No intake/output data recorded.  Vent settings for last 24 hours:    Physical Exam:  Gen: comfortable, no distress Neuro: non-focal exam HEENT: PERRL Neck: supple CV: RRR Pulm: unlabored breathing Abd: soft, NT GU: clear yellow urine Extr: wwp, no edema   Results for orders placed or performed during the hospital encounter of 04/23/22 (from the past 24 hour(s))  Group A Strep by PCR     Status: None   Collection Time: 05/04/22  2:49 PM   Specimen: Throat; Sterile Swab  Result Value Ref Range   Group A Strep by PCR NOT DETECTED NOT DETECTED  CBC     Status: Abnormal   Collection Time: 05/05/22  5:05 AM  Result Value Ref Range   WBC 12.1 (H) 4.0 - 10.5 K/uL   RBC 3.13 (L) 3.87 - 5.11 MIL/uL   Hemoglobin 9.1 (L) 12.0 - 15.0 g/dL   HCT 29.0 (L) 36.0 - 46.0 %   MCV 92.7 80.0 - 100.0 fL   MCH 29.1 26.0 - 34.0 pg   MCHC 31.4 30.0 - 36.0 g/dL   RDW 14.7 11.5 - 15.5 %   Platelets 484 (H) 150 - 400 K/uL   nRBC 1.7 (H) 0.0 - 0.2 %  Heparin level (unfractionated)     Status: Abnormal   Collection Time: 05/05/22  5:05 AM  Result Value Ref Range   Heparin Unfractionated 0.28 (L) 0.30 - 0.70 IU/mL  Basic metabolic panel     Status: Abnormal   Collection Time: 05/05/22  5:05 AM  Result Value Ref Range   Sodium 140 135 - 145 mmol/L   Potassium 4.2 3.5 - 5.1 mmol/L   Chloride 109 98 - 111 mmol/L   CO2 21 (L) 22 - 32 mmol/L   Glucose, Bld 92 70 - 99 mg/dL    BUN 14 8 - 23 mg/dL   Creatinine, Ser 0.73 0.44 - 1.00 mg/dL   Calcium 8.7 (L) 8.9 - 10.3 mg/dL   GFR, Estimated >60 >60 mL/min   Anion gap 10 5 - 15    Assessment & Plan: The plan of care was discussed with the bedside nurse for the day, who is in agreement with this plan and no additional concerns were raised.   Present on Admission:  Ruptured spleen    LOS: 12 days   Additional comments:I reviewed the patient's new clinical lab test results.   and I reviewed the patients new imaging test results.    GLF   Grade 4 spleen lac with active extravasation - POD#12 S/P splenectomy by Dr. Ninfa Linden 12/3, ileus improved, NG out and was started on a diet. Plan vaccines prior to D/C. Remove staples 12/15. Path negative. Scalp lac - repaired in the OR, will need staples removed 12/15 Hx DVT/PE on Eliquis - reversed, duplex BLE negative for DVT; LUE 12/8  +DVT - see below ABL anemia - hgb 8.8, stable HTN - stop scheduled IV metop and resume  home Coreg and Norvasc. Too drowsy to take PO this morning, added IV hydralazine PRN PMH lymphoma  PMH pacemaker placement  Delirium - waxes and wanes  UTI - Ucx Enterococcus faecalis. Continue nitrofurantoin x7 days   VTE - LUE DVT, hgb stable and hep gtt therapeutic, too drowsy for PO this morning but will plan to transition to 5mg  BID Eliquis when able FEN - IVF@75cc /hr, working with SLP - rec thin liquids if fed with caution when alert. Discussed administering meds/nutrition via alternative means (ie Cortrak) and patient's daughter declines ID: Macrobid for UTI as above. She is afebrile, monitor leukocytosis which is likely secondary to asplenia.   Dispo - 4NP, PT/OT/SLP  Jesusita Oka, MD Trauma & General Surgery Please use [email or web address] to contact on call provider  05/05/2022  *Care during the described time interval was provided by me. I have reviewed this patient's available data, including medical history, events of note, physical  examination and test results as part of my evaluation.

## 2022-05-05 NOTE — Progress Notes (Signed)
ANTICOAGULATION CONSULT NOTE - Follow Up Consult  Pharmacy Consult for Heparin Indication:  new LUE DVT  Patient Measurements: Height: 5\' 4"  (162.6 cm) Weight: 50.7 kg (111 lb 12.4 oz) IBW/kg (Calculated) : 54.7 Heparin Dosing Weight: 50.7 kg  Labs: Recent Labs    05/03/22 0500 05/03/22 1437 05/04/22 0515 05/05/22 0505  HGB 9.1*  --  8.8* 9.1*  HCT 27.3*  --  27.6* 29.0*  PLT 557*  --  581* 484*  HEPARINUNFRC 0.26* 0.42 0.46 0.28*  CREATININE  --   --  0.66 0.73     Estimated Creatinine Clearance: 39.7 mL/min (by C-G formula based on SCr of 0.73 mg/dL).  Assessment: 40 YOF with hx of lymphoma, DVT/PE on apixaban PTA presenting with ruptured spleen, s/p splenectomy 12/3, now with ileus post-op. Apixaban was reversed on admission prior to procedure with Andexxa. Patient found to have new LUE DVT. Pharmacy consulted to start heparin with no bolus. Last dose of apixaban was 12/3 AM PTA and last dose of Lovenox 30mg  [place] q12h inpatient given 12/8 AM.   Heparin level this morning subtherapeutic at 0.28. H & H stable.   Goal of Therapy:  Heparin level 0.3-0.7 units/ml Monitor platelets by anticoagulation protocol: Yes   Plan:  Increase heparin drip 1050 units/hr.  8hr HL at 2000 Monitor daily heparin level and CBC, s/sx bleeding  Wilson Singer, PharmD Clinical Pharmacist 05/05/2022 11:36 AM

## 2022-05-05 NOTE — Discharge Summary (Signed)
[facility] Discharge Summary   Patient ID: Sarah [name] MRN: [id number] DOB/AGE: 86-May-1934 86 y.o.  Admit date: 04/23/2022 Discharge date: 05/06/2022 ***  Admitting Diagnosis: Fall Scalp laceration Splenic laceration  Discharge Diagnosis Patient Active Problem List   Diagnosis Date Noted   Protein-calorie malnutrition, severe 04/29/2022   Status post surgery 04/23/2022   Ruptured spleen 04/23/2022   Clavi 12/27/2021   AV block 11/04/2021   Symptomatic bradycardia 11/02/2021   History of CVA (cerebrovascular accident) 11/02/2021   GERD (gastroesophageal reflux disease) 11/02/2021   Depression 11/02/2021   Pain in left tibia 11/01/2021   High Grade AV block 10/31/2021   Atrioventricular block, Mobitz type 1, Wenckebach 10/01/2021   History of pulmonary embolism 10/01/2021   Hypokalemia 10/01/2021   Thrombocytopenia ([place]) 10/01/2021   Cerebral thrombosis with cerebral infarction 05/16/2021   Headache 05/15/2021   Neuropathy 10/27/2020   Abnormal gait 06/22/2020   Age-related osteoporosis without current pathological fracture 06/22/2020   Anxiety 06/22/2020   Hardening of the aorta (main artery of the heart) ([place]) 06/22/2020   Malignant lymphoma, non-Hodgkin's ([place]) 06/22/2020   Mild cognitive disorder 06/22/2020   Non-pressure chronic ulcer of unspecified part of unspecified lower leg with unspecified severity ([place]) 06/22/2020   Insomnia 06/22/2020   Pulmonary nodule 06/22/2020   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   Abnormal chest x-ray 12/31/2019   Cough 12/31/2019   Port-A-Cath in place 06/04/2019   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   Counseling regarding advance care planning and goals of care 04/14/2019   Lymphadenopathy of head and neck 03/17/2019   Lymph nodes enlarged 03/17/2019   History of pulmonary embolus (PE) 03/17/2019   Pulmonary embolism ([place]) 03/06/2019   Diffuse lymphadenopathy 03/06/2019   History of venous thromboembolism  02/2019   Pruritus 02/06/2019   Angio-edema 02/06/2019   Shortness of breath 02/06/2019   Osteopenia 12/18/2013   Heart block AV first degree 12/18/2013   Breast cancer of upper-inner quadrant of right female breast ([place]) 03/19/2012   Right knee DJD 08/18/2011   Hypothyroidism    Hypertension    PONV (postoperative nausea and vomiting)    Hyperlipemia     Consultants *** Imaging: No results found.  Procedures Dr. Marland Kitchen Marland Kitchen/**/17) - Laparoscopic Cholecystectomy with IOC Dr. Marland Kitchen Marland Kitchen/**/17) - Laparoscopic Appendectomy  HPI:  88F s/p mechanical GLF. Patient has dementia and lives at [facility]. On Eliquis for PE/DVT in 2020, last dose 0930 this AM.   Hospital Course:  Thorough trauma workup significant for the below injuries along with their management:  GLF   Grade 4 spleen lac with active extravasation - S/P splenectomy by Dr. Ninfa Linden 12/3, had a post-operative ileus requiring NG tube, this improved. Diet advanced as tolerated per SLP recommendations. out and was started on a diet. Removed staples 12/15. Path negative. *** splenic vaccines  Scalp lac - repaired in the OR, staples removed 12/15 Hx DVT/PE on Eliquis - reversed, duplex BLE negative for DVT ;left upper extremity doppler performed 12/8 due to swelling and was positive for extensive DVT. This was the same arm the patient had a central line in (L IJ) and an arterial line. See below.  ABL anemia - hgb 8.8, stable HTN - home Coreg and Norvasc. IV hydralazine PRN while in hospital PMH lymphoma  PMH pacemaker placement  Delirium - waxes and wanes  UTI - Ucx Enterococcus faecalis. Continue nitrofurantoin x7 days total, started on 12/12   VTE - LUE DVT, hgb stable and hep gtt therapeutic, plan to transition  to 5mg  BID Eliquis 12/15 PM. Dr. Bobbye Morton had an extensive conversation with the patients daughter going over the risks and benefits of full vs partial vs no anticoagulation given acute DVT and recent falls.  FEN -  working  with SLP - soft diet. they rec thin  liquids if fed with caution when alert. Discussed administering meds/nutrition via alternative means (ie Cortrak) and patient's daughter declines  ID: Macrobid for UTI as above. She is afebrile, monitor leukocytosis which is likely secondary to asplenia.  On 05/06/22 the patients vitals were stable, tolerating PO, working with therapies, and felt stable for discharge to a skilled nursing facility. Follow up as below. ***   Physical Exam: Physical Exam:  Gen: comfortable, no distress Neuro: non-focal exam HEENT: PERRL Neck: supple CV: RRR Pulm: unlabored breathing Abd: soft, NT GU: clear yellow urine Extr: wwp, no edema  ***   Allergies as of 05/05/2022       Reactions   Alendronate Sodium Other (See Comments)   UNK reaction   Amoxicillin-pot Clavulanate Other (See Comments)   UNK reaction   Codeine Other (See Comments)   Reaction not recalled   Lisinopril Other (See Comments)   UNK reaction   Septra [sulfamethoxazole-trimethoprim] Other (See Comments)   Thiazide-type Diuretics Other (See Comments)   Blurry vision?? (patient stated she has macular degeneration)   Latex Rash     Med Rec must be completed prior to using this [place]***         Contact information for follow-up providers     [facility] Follow up on 05/25/2022.   Why: at 9:40 AM for follow up from your recent abdominal surgery. please arrive 30 minutes early. Contact information: [place] [address] [phone]             Contact information for after-discharge care     Destination     [facility] Preferred SNF .   Service: Skilled Nursing Contact information: [address] [place] [phone]                     Signed: Obie Dredge, [facility] 05/05/2022, 4:18 PM

## 2022-05-05 NOTE — Progress Notes (Signed)
Staples removed per order. No signs of bleeding.

## 2022-05-05 NOTE — TOC Progression Note (Addendum)
Transition of Care [facility]) - Progression Note    Patient Details  Name: KIELYN KARDELL MRN: [id number] Date of Birth: Jul 20, 1932  Transition of Care [facility]) CM/SW Contact  Ella Bodo, RN Phone Number: 05/05/2022, 2:06 PM  Clinical Narrative:    Patient's daughter has chosen a SNF bed at [facility].  Daughter states she prefers that patient dc on Monday, but MD states patient will need to dc tomorrow, 12/16. Spoke with Tammy in admissions at [facility]; she states patient able to be admitted on Sat.  She will be working over the weekend; [place] may call her at [phone].  MD to call daughter to inform her of patient dc on Saturday.     Expected Discharge Plan: Skilled Nursing Facility Barriers to Discharge: Continued Medical Work up  Expected Discharge Plan and Services Expected Discharge Plan: [place]   Discharge Planning Services: CM Consult   Living arrangements for the past 2 months: Single Family Home                                       Social Determinants of Health (SDOH) Interventions    Readmission Risk Interventions    11/04/2021   11:19 AM  Readmission Risk Prevention Plan  Transportation Screening Complete  HRI or [place] Complete  Social Work Consult for [place] Planning/Counseling Complete  Palliative Care Screening Not Applicable   Reinaldo Raddle, RN, BSN  Trauma/Neuro ICU Case Manager [phone]

## 2022-05-06 ENCOUNTER — Emergency Department
Admission: EM | Admit: 2022-05-06 | Discharge: 2022-05-06 | Disposition: A | Discharge status: Skilled Nursing Facility | Payer: Medicare Other | Attending: Emergency Medicine | Admitting: Emergency Medicine

## 2022-05-06 ENCOUNTER — Other Ambulatory Visit: Payer: Self-pay

## 2022-05-06 DIAGNOSIS — C865 Angioimmunoblastic T-cell lymphoma: Secondary | ICD-10-CM | POA: Diagnosis not present

## 2022-05-06 DIAGNOSIS — R2689 Other abnormalities of gait and mobility: Secondary | ICD-10-CM | POA: Diagnosis not present

## 2022-05-06 DIAGNOSIS — S0101XA Laceration without foreign body of scalp, initial encounter: Secondary | ICD-10-CM | POA: Diagnosis not present

## 2022-05-06 DIAGNOSIS — G4700 Insomnia, unspecified: Secondary | ICD-10-CM | POA: Diagnosis not present

## 2022-05-06 DIAGNOSIS — D649 Anemia, unspecified: Secondary | ICD-10-CM | POA: Diagnosis not present

## 2022-05-06 DIAGNOSIS — S0101XD Laceration without foreign body of scalp, subsequent encounter: Secondary | ICD-10-CM | POA: Diagnosis not present

## 2022-05-06 DIAGNOSIS — Z7401 Bed confinement status: Secondary | ICD-10-CM | POA: Diagnosis not present

## 2022-05-06 DIAGNOSIS — Z96653 Presence of artificial knee joint, bilateral: Secondary | ICD-10-CM | POA: Diagnosis not present

## 2022-05-06 DIAGNOSIS — M79675 Pain in left toe(s): Secondary | ICD-10-CM | POA: Diagnosis not present

## 2022-05-06 DIAGNOSIS — R531 Weakness: Secondary | ICD-10-CM | POA: Diagnosis not present

## 2022-05-06 DIAGNOSIS — R0902 Hypoxemia: Secondary | ICD-10-CM | POA: Diagnosis not present

## 2022-05-06 DIAGNOSIS — T83198A Other mechanical complication of other urinary devices and implants, initial encounter: Secondary | ICD-10-CM | POA: Diagnosis not present

## 2022-05-06 DIAGNOSIS — S36032A Major laceration of spleen, initial encounter: Secondary | ICD-10-CM | POA: Diagnosis not present

## 2022-05-06 DIAGNOSIS — Z452 Encounter for adjustment and management of vascular access device: Secondary | ICD-10-CM | POA: Insufficient documentation

## 2022-05-06 DIAGNOSIS — S3609XD Other injury of spleen, subsequent encounter: Secondary | ICD-10-CM | POA: Diagnosis not present

## 2022-05-06 DIAGNOSIS — I82C12 Acute embolism and thrombosis of left internal jugular vein: Secondary | ICD-10-CM | POA: Diagnosis not present

## 2022-05-06 DIAGNOSIS — N3090 Cystitis, unspecified without hematuria: Secondary | ICD-10-CM | POA: Diagnosis not present

## 2022-05-06 DIAGNOSIS — I82A22 Chronic embolism and thrombosis of left axillary vein: Secondary | ICD-10-CM | POA: Diagnosis not present

## 2022-05-06 DIAGNOSIS — R1311 Dysphagia, oral phase: Secondary | ICD-10-CM | POA: Diagnosis not present

## 2022-05-06 DIAGNOSIS — Z8679 Personal history of other diseases of the circulatory system: Secondary | ICD-10-CM | POA: Diagnosis not present

## 2022-05-06 DIAGNOSIS — Z7901 Long term (current) use of anticoagulants: Secondary | ICD-10-CM | POA: Diagnosis not present

## 2022-05-06 DIAGNOSIS — Z5111 Encounter for antineoplastic chemotherapy: Secondary | ICD-10-CM | POA: Diagnosis not present

## 2022-05-06 DIAGNOSIS — R8290 Unspecified abnormal findings in urine: Secondary | ICD-10-CM | POA: Diagnosis not present

## 2022-05-06 DIAGNOSIS — I82A12 Acute embolism and thrombosis of left axillary vein: Secondary | ICD-10-CM | POA: Diagnosis not present

## 2022-05-06 DIAGNOSIS — Z86718 Personal history of other venous thrombosis and embolism: Secondary | ICD-10-CM | POA: Diagnosis not present

## 2022-05-06 DIAGNOSIS — K219 Gastro-esophageal reflux disease without esophagitis: Secondary | ICD-10-CM | POA: Diagnosis not present

## 2022-05-06 DIAGNOSIS — B351 Tinea unguium: Secondary | ICD-10-CM | POA: Diagnosis not present

## 2022-05-06 DIAGNOSIS — M6259 Muscle wasting and atrophy, not elsewhere classified, multiple sites: Secondary | ICD-10-CM | POA: Diagnosis not present

## 2022-05-06 DIAGNOSIS — E785 Hyperlipidemia, unspecified: Secondary | ICD-10-CM | POA: Diagnosis not present

## 2022-05-06 DIAGNOSIS — K567 Ileus, unspecified: Secondary | ICD-10-CM | POA: Diagnosis not present

## 2022-05-06 DIAGNOSIS — I82612 Acute embolism and thrombosis of superficial veins of left upper extremity: Secondary | ICD-10-CM | POA: Diagnosis not present

## 2022-05-06 DIAGNOSIS — R41841 Cognitive communication deficit: Secondary | ICD-10-CM | POA: Diagnosis not present

## 2022-05-06 DIAGNOSIS — I4891 Unspecified atrial fibrillation: Secondary | ICD-10-CM | POA: Diagnosis not present

## 2022-05-06 DIAGNOSIS — Z950 Presence of cardiac pacemaker: Secondary | ICD-10-CM | POA: Diagnosis not present

## 2022-05-06 DIAGNOSIS — I10 Essential (primary) hypertension: Secondary | ICD-10-CM | POA: Diagnosis not present

## 2022-05-06 DIAGNOSIS — Z09 Encounter for follow-up examination after completed treatment for conditions other than malignant neoplasm: Secondary | ICD-10-CM | POA: Diagnosis not present

## 2022-05-06 DIAGNOSIS — Y92098 Other place in other non-institutional residence as the place of occurrence of the external cause: Secondary | ICD-10-CM | POA: Diagnosis not present

## 2022-05-06 DIAGNOSIS — M810 Age-related osteoporosis without current pathological fracture: Secondary | ICD-10-CM | POA: Diagnosis not present

## 2022-05-06 DIAGNOSIS — I4819 Other persistent atrial fibrillation: Secondary | ICD-10-CM | POA: Diagnosis not present

## 2022-05-06 DIAGNOSIS — I441 Atrioventricular block, second degree: Secondary | ICD-10-CM | POA: Diagnosis not present

## 2022-05-06 DIAGNOSIS — I4430 Unspecified atrioventricular block: Secondary | ICD-10-CM | POA: Diagnosis not present

## 2022-05-06 DIAGNOSIS — D62 Acute posthemorrhagic anemia: Secondary | ICD-10-CM | POA: Diagnosis not present

## 2022-05-06 DIAGNOSIS — Z741 Need for assistance with personal care: Secondary | ICD-10-CM | POA: Diagnosis not present

## 2022-05-06 DIAGNOSIS — C8440 Peripheral T-cell lymphoma, not classified, unspecified site: Secondary | ICD-10-CM | POA: Diagnosis not present

## 2022-05-06 DIAGNOSIS — Z681 Body mass index (BMI) 19 or less, adult: Secondary | ICD-10-CM | POA: Diagnosis not present

## 2022-05-06 DIAGNOSIS — R635 Abnormal weight gain: Secondary | ICD-10-CM | POA: Diagnosis not present

## 2022-05-06 DIAGNOSIS — D696 Thrombocytopenia, unspecified: Secondary | ICD-10-CM | POA: Diagnosis not present

## 2022-05-06 DIAGNOSIS — Z79899 Other long term (current) drug therapy: Secondary | ICD-10-CM | POA: Diagnosis not present

## 2022-05-06 DIAGNOSIS — R279 Unspecified lack of coordination: Secondary | ICD-10-CM | POA: Diagnosis not present

## 2022-05-06 DIAGNOSIS — T82594A Other mechanical complication of infusion catheter, initial encounter: Secondary | ICD-10-CM | POA: Diagnosis not present

## 2022-05-06 DIAGNOSIS — F0390 Unspecified dementia without behavioral disturbance: Secondary | ICD-10-CM | POA: Diagnosis not present

## 2022-05-06 DIAGNOSIS — I82622 Acute embolism and thrombosis of deep veins of left upper extremity: Secondary | ICD-10-CM | POA: Diagnosis not present

## 2022-05-06 DIAGNOSIS — I82B12 Acute embolism and thrombosis of left subclavian vein: Secondary | ICD-10-CM | POA: Diagnosis not present

## 2022-05-06 DIAGNOSIS — I2699 Other pulmonary embolism without acute cor pulmonale: Secondary | ICD-10-CM | POA: Diagnosis not present

## 2022-05-06 DIAGNOSIS — B009 Herpesviral infection, unspecified: Secondary | ICD-10-CM | POA: Diagnosis not present

## 2022-05-06 DIAGNOSIS — Z95828 Presence of other vascular implants and grafts: Secondary | ICD-10-CM

## 2022-05-06 DIAGNOSIS — R4182 Altered mental status, unspecified: Secondary | ICD-10-CM | POA: Diagnosis not present

## 2022-05-06 DIAGNOSIS — S36039S Unspecified laceration of spleen, sequela: Secondary | ICD-10-CM | POA: Diagnosis not present

## 2022-05-06 DIAGNOSIS — K625 Hemorrhage of anus and rectum: Secondary | ICD-10-CM | POA: Diagnosis not present

## 2022-05-06 DIAGNOSIS — W19XXXA Unspecified fall, initial encounter: Secondary | ICD-10-CM | POA: Diagnosis not present

## 2022-05-06 DIAGNOSIS — E039 Hypothyroidism, unspecified: Secondary | ICD-10-CM | POA: Diagnosis not present

## 2022-05-06 DIAGNOSIS — L84 Corns and callosities: Secondary | ICD-10-CM | POA: Diagnosis not present

## 2022-05-06 DIAGNOSIS — Z9081 Acquired absence of spleen: Secondary | ICD-10-CM | POA: Diagnosis not present

## 2022-05-06 DIAGNOSIS — E876 Hypokalemia: Secondary | ICD-10-CM | POA: Diagnosis not present

## 2022-05-06 DIAGNOSIS — R2232 Localized swelling, mass and lump, left upper limb: Secondary | ICD-10-CM | POA: Diagnosis present

## 2022-05-06 DIAGNOSIS — I959 Hypotension, unspecified: Secondary | ICD-10-CM | POA: Diagnosis not present

## 2022-05-06 DIAGNOSIS — D689 Coagulation defect, unspecified: Secondary | ICD-10-CM | POA: Diagnosis not present

## 2022-05-06 DIAGNOSIS — K649 Unspecified hemorrhoids: Secondary | ICD-10-CM | POA: Diagnosis not present

## 2022-05-06 DIAGNOSIS — Z91040 Latex allergy status: Secondary | ICD-10-CM | POA: Diagnosis not present

## 2022-05-06 DIAGNOSIS — K661 Hemoperitoneum: Secondary | ICD-10-CM | POA: Diagnosis not present

## 2022-05-06 DIAGNOSIS — Z23 Encounter for immunization: Secondary | ICD-10-CM | POA: Diagnosis not present

## 2022-05-06 DIAGNOSIS — R609 Edema, unspecified: Secondary | ICD-10-CM | POA: Diagnosis not present

## 2022-05-06 DIAGNOSIS — G629 Polyneuropathy, unspecified: Secondary | ICD-10-CM | POA: Diagnosis not present

## 2022-05-06 DIAGNOSIS — Z8673 Personal history of transient ischemic attack (TIA), and cerebral infarction without residual deficits: Secondary | ICD-10-CM | POA: Diagnosis not present

## 2022-05-06 DIAGNOSIS — I82619 Acute embolism and thrombosis of superficial veins of unspecified upper extremity: Secondary | ICD-10-CM | POA: Diagnosis not present

## 2022-05-06 DIAGNOSIS — F32A Depression, unspecified: Secondary | ICD-10-CM | POA: Diagnosis not present

## 2022-05-06 DIAGNOSIS — M6281 Muscle weakness (generalized): Secondary | ICD-10-CM | POA: Diagnosis not present

## 2022-05-06 DIAGNOSIS — E559 Vitamin D deficiency, unspecified: Secondary | ICD-10-CM | POA: Diagnosis not present

## 2022-05-06 DIAGNOSIS — M7989 Other specified soft tissue disorders: Secondary | ICD-10-CM | POA: Diagnosis not present

## 2022-05-06 DIAGNOSIS — M79674 Pain in right toe(s): Secondary | ICD-10-CM | POA: Diagnosis not present

## 2022-05-06 DIAGNOSIS — N390 Urinary tract infection, site not specified: Secondary | ICD-10-CM | POA: Diagnosis not present

## 2022-05-06 DIAGNOSIS — Z86711 Personal history of pulmonary embolism: Secondary | ICD-10-CM | POA: Diagnosis not present

## 2022-05-06 DIAGNOSIS — W1839XA Other fall on same level, initial encounter: Secondary | ICD-10-CM | POA: Diagnosis present

## 2022-05-06 DIAGNOSIS — D8481 Immunodeficiency due to conditions classified elsewhere: Secondary | ICD-10-CM | POA: Diagnosis not present

## 2022-05-06 DIAGNOSIS — Z853 Personal history of malignant neoplasm of breast: Secondary | ICD-10-CM | POA: Diagnosis not present

## 2022-05-06 DIAGNOSIS — I739 Peripheral vascular disease, unspecified: Secondary | ICD-10-CM | POA: Diagnosis not present

## 2022-05-06 DIAGNOSIS — M19032 Primary osteoarthritis, left wrist: Secondary | ICD-10-CM | POA: Diagnosis not present

## 2022-05-06 DIAGNOSIS — E43 Unspecified severe protein-calorie malnutrition: Secondary | ICD-10-CM | POA: Diagnosis not present

## 2022-05-06 LAB — CBC
HCT: 28.2 % — ABNORMAL LOW (ref 36.0–46.0)
Hemoglobin: 8.8 g/dL — ABNORMAL LOW (ref 12.0–15.0)
MCH: 28.9 pg (ref 26.0–34.0)
MCHC: 31.2 g/dL (ref 30.0–36.0)
MCV: 92.5 fL (ref 80.0–100.0)
Platelets: 579 10*3/uL — ABNORMAL HIGH (ref 150–400)
RBC: 3.05 MIL/uL — ABNORMAL LOW (ref 3.87–5.11)
RDW: 14.6 % (ref 11.5–15.5)
WBC: 10.6 10*3/uL — ABNORMAL HIGH (ref 4.0–10.5)
nRBC: 0.9 % — ABNORMAL HIGH (ref 0.0–0.2)

## 2022-05-06 MED ORDER — MENINGOCOCCAL VAC B (OMV) IM SUSY
0.5000 mL | PREFILLED_SYRINGE | INTRAMUSCULAR | Status: AC | PRN
  Administered 2022-05-06: 0.5 mL via INTRAMUSCULAR
  Filled 2022-05-06: qty 0.5

## 2022-05-06 MED ORDER — NITROFURANTOIN MONOHYD MACRO 100 MG PO CAPS: 100.0000 mg | ORAL_CAPSULE | Freq: Two times a day (BID) | ORAL | Status: DC

## 2022-05-06 MED ORDER — HAEMOPHILUS B POLYSAC CONJ VAC IM SOLR
0.5000 mL | INTRAMUSCULAR | Status: AC | PRN
  Administered 2022-05-06: 0.5 mL via INTRAMUSCULAR
  Filled 2022-05-06: qty 0.5

## 2022-05-06 MED ORDER — ENSURE ENLIVE PO LIQD: 237.0000 mL | Freq: Two times a day (BID) | ORAL | 12 refills | Status: DC

## 2022-05-06 MED ORDER — HEPARIN SOD (PORK) LOCK FLUSH 10 UNIT/ML IV SOLN
50.0000 [IU] | Freq: Once | INTRAVENOUS | Status: AC
  Administered 2022-05-06: 50 [IU] via INTRAVENOUS

## 2022-05-06 MED ORDER — MENINGOCOCCAL A C Y&W-135 OLIG IM SOLR
0.5000 mL | INTRAMUSCULAR | Status: AC | PRN
  Administered 2022-05-06: 0.5 mL via INTRAMUSCULAR
  Filled 2022-05-06: qty 0.5

## 2022-05-06 MED ORDER — PNEUMOCOCCAL 20-VAL CONJ VACC 0.5 ML IM SUSY
0.5000 mL | PREFILLED_SYRINGE | INTRAMUSCULAR | Status: AC | PRN
  Administered 2022-05-06: 0.5 mL via INTRAMUSCULAR
  Filled 2022-05-06: qty 0.5

## 2022-05-06 MED ORDER — APIXABAN 5 MG PO TABS: 5.0000 mg | ORAL_TABLET | Freq: Two times a day (BID) | ORAL | Status: DC

## 2022-05-06 MED ORDER — LIDOCAINE 5 % EX PTCH: 1.0000 | MEDICATED_PATCH | CUTANEOUS | 0 refills | Status: DC

## 2022-05-06 NOTE — TOC Transition Note (Signed)
Transition of Care [facility]) - CM/SW Discharge Note   Patient Details  Name: Sarah [name] MRN: [id number] Date of Birth: 1933/03/10  Transition of Care [facility] CM/SW Contact:  Amador Cunas, LCSW Phone Number: 05/06/2022, 11:09 AM   Clinical Narrative:  Pt for dc to [facility] today. Pt's dtr at bedside and is agreeable to dc plan. Spoke to Tammy at [facility] who confirmed they are prepared to admit pt to room 711. RN provided with number for report and PTAR arranged for transport. SW signing off at dc.   Wandra Feinstein, MSW, LCSW [phone] (coverage)       Final next level of care: [place] Barriers to Discharge: No Barriers Identified   Patient Goals and CMS Choice     Choice offered to / list presented to : Adult Children  Discharge Placement              Patient chooses bed at: [facility] [place] Patient to be transferred to facility by: [place] Name of family member notified: [name] Patient and family notified of of transfer: 05/06/22  Discharge Plan and Services   Discharge Planning Services: CM Consult                                 Social Determinants of Health ([place]) Interventions     Readmission Risk Interventions    11/04/2021   11:19 AM  Readmission Risk Prevention Plan  Transportation Screening Complete  HRI or Home Care Consult Complete  Social Work Consult for [place] Planning/Counseling Complete  Palliative Care Screening Not Applicable

## 2022-05-06 NOTE — ED Provider Notes (Signed)
   [facility] Provider Note    Event Date/Time   First MD Initiated Contact with Patient 05/06/22 1544     (approximate)   History   Vascular Access Problem   HPI  Sarah [name] is a 88 y.o. female who was admitted on 12/3 back at [facility].  Reviewed the notes were patient had a grade 4 splenic lack with active extravasation.  She has a right sided port.  She was discharged this morning from [facility] and got to [facility].  Unfortunately it was noted the patient has still had her port accessed and this was never the access so they sent patient over here to have this deaccessed.  We were able to get a hold of facility confirmed that this was the only concern that they had.  I also talked to the POA the daughter who confirmed that patient is otherwise acting her exact normal self and they did not want any other workup done or repeat labs given she was just discharged this morning.  Their goal was to get the port removed so that she could return back to facility.   Physical Exam   Triage Vital Signs: ED Triage Vitals  Enc Vitals Group     BP      Pulse      Resp      Temp      Temp src      SpO2      Weight      Height      Head Circumference      [place] Flow      Pain Score      Pain Loc      Pain Edu?      Excl. in [place]?     Most recent vital signs: There were no vitals filed for this visit.   General: Awake, no distress.  CV:  Good peripheral perfusion.  Resp:  Normal effort.  Abd:  No distention.  Other:  Patient has a right-sided port noted without any erythema or warmth around the   ED Results / Procedures / Treatments  PROCEDURES:  Critical Care performed: No  Procedures   MEDICATIONS ORDERED IN ED: Medications - No data to display   IMPRESSION / MDM / [place] / ED COURSE  I reviewed the triage vital signs and the nursing notes.   Patient's presentation is most consistent with acute, uncomplicated  illness.   Patient comes in with concerns for needing her port the access.  Discussed with the POA the daughter who denies any other concerns they would like to get this done and get patient back to facility.  PureWick was already removed but there was some concern initially that this was still in place as well.  Therefore we will deaccessed the port and discharge patient back to [facility]      FINAL CLINICAL IMPRESSION(S) / ED DIAGNOSES   Final diagnoses:  Port-A-Cath in place     Rx / DC Orders   ED Discharge Orders     None        Note:  This document was prepared using Dragon voice recognition software and may include unintentional dictation errors.   Vanessa [place], MD 05/06/22 [phone]

## 2022-05-06 NOTE — Discharge Instructions (Signed)
Patient's port was de accessed.  Return to the ER if she develops worsening symptoms or any other concerns

## 2022-05-06 NOTE — Discharge Instructions (Signed)
Information on my medicine - ELIQUIS (apixaban)  This medication education was reviewed with me or my healthcare representative as part of my discharge preparation.  Why was Eliquis prescribed for you? Eliquis was prescribed to treat blood clots that may have been found in the veins of your legs (deep vein thrombosis) or in your lungs (pulmonary embolism) and to reduce the risk of them occurring again.  What do You need to know about Eliquis ? The dose is ONE 5 mg tablet taken TWICE daily.  Eliquis may be taken with or without food.   Try to take the dose about the same time in the morning and in the evening. If you have difficulty swallowing the tablet whole please discuss with your pharmacist how to take the medication safely.  Take Eliquis exactly as prescribed and DO NOT stop taking Eliquis without talking to the doctor who prescribed the medication.  Stopping may increase your risk of developing a new blood clot.  Refill your prescription before you run out.  After discharge, you should have regular check-up appointments with your healthcare provider that is prescribing your Eliquis.    What do you do if you miss a dose? If a dose of ELIQUIS is not taken at the scheduled time, take it as soon as possible on the same day and twice-daily administration should be resumed. The dose should not be doubled to make up for a missed dose.  Important Safety Information A possible side effect of Eliquis is bleeding. You should call your healthcare provider right away if you experience any of the following: Bleeding from an injury or your nose that does not stop. Unusual colored urine (red or dark brown) or unusual colored stools (red or black). Unusual bruising for unknown reasons. A serious fall or if you hit your head (even if there is no bleeding).  Some medicines may interact with Eliquis and might increase your risk of bleeding or clotting while on Eliquis. To help avoid this,  consult your healthcare provider or pharmacist prior to using any new prescription or non-prescription medications, including herbals, vitamins, non-steroidal anti-inflammatory drugs (NSAIDs) and supplements.  This website has more information on Eliquis (apixaban): http://www.eliquis.com/eliquis/home

## 2022-05-08 DIAGNOSIS — S36039S Unspecified laceration of spleen, sequela: Secondary | ICD-10-CM | POA: Diagnosis not present

## 2022-05-08 DIAGNOSIS — S0101XA Laceration without foreign body of scalp, initial encounter: Secondary | ICD-10-CM | POA: Diagnosis not present

## 2022-05-08 DIAGNOSIS — M6281 Muscle weakness (generalized): Secondary | ICD-10-CM | POA: Diagnosis not present

## 2022-05-08 DIAGNOSIS — N390 Urinary tract infection, site not specified: Secondary | ICD-10-CM | POA: Diagnosis not present

## 2022-05-08 LAB — HSV CULTURE AND TYPING

## 2022-05-10 DIAGNOSIS — D649 Anemia, unspecified: Secondary | ICD-10-CM | POA: Diagnosis not present

## 2022-05-10 DIAGNOSIS — K649 Unspecified hemorrhoids: Secondary | ICD-10-CM | POA: Diagnosis not present

## 2022-05-10 DIAGNOSIS — S36039S Unspecified laceration of spleen, sequela: Secondary | ICD-10-CM | POA: Diagnosis not present

## 2022-05-10 DIAGNOSIS — Z86718 Personal history of other venous thrombosis and embolism: Secondary | ICD-10-CM | POA: Diagnosis not present

## 2022-05-11 DIAGNOSIS — B009 Herpesviral infection, unspecified: Secondary | ICD-10-CM | POA: Diagnosis not present

## 2022-05-14 ENCOUNTER — Other Ambulatory Visit: Payer: Self-pay | Admitting: Student

## 2022-05-16 ENCOUNTER — Telehealth: Payer: Self-pay

## 2022-05-16 DIAGNOSIS — K649 Unspecified hemorrhoids: Secondary | ICD-10-CM | POA: Diagnosis not present

## 2022-05-16 DIAGNOSIS — B009 Herpesviral infection, unspecified: Secondary | ICD-10-CM | POA: Diagnosis not present

## 2022-05-16 DIAGNOSIS — S36039S Unspecified laceration of spleen, sequela: Secondary | ICD-10-CM | POA: Diagnosis not present

## 2022-05-16 NOTE — Telephone Encounter (Signed)
Received the following from [employer]:  No messages received for at least 21 days.   Please reach out to patient and find out why they aren't transmitting.  Thanks.

## 2022-05-16 NOTE — Telephone Encounter (Signed)
The patient was in the hospital and now in [place]. Her daughter states she did plug the monitor in this morning. It should transmit tomorrow. I will look to make sure.

## 2022-05-17 ENCOUNTER — Telehealth: Payer: Self-pay

## 2022-05-17 NOTE — Telephone Encounter (Signed)
Transmission received 05/17/2022.

## 2022-05-17 NOTE — Telephone Encounter (Addendum)
RA sensing amplitude (daily mean) below limit (< 0.5 mV) Last value 0.2 mV measured on May 17, 2022 1:38:00 AM  RA undersensing/? Over pacing? Throwing PPM timing off. (See below)  Patient needs to be seen in next few weeks to evaluate and make adjustments to RA lead.  Spoke with daughter Sarah [name]. Patient is in rehab facility and very deconditioned at this point.  Appt made for 06/08/22 to see Tommye Standard, PA-C for follow up.      PRESENTING RHYTHM:

## 2022-05-18 ENCOUNTER — Ambulatory Visit: Payer: Medicare Other

## 2022-05-18 DIAGNOSIS — Z86718 Personal history of other venous thrombosis and embolism: Secondary | ICD-10-CM | POA: Diagnosis not present

## 2022-05-18 DIAGNOSIS — B009 Herpesviral infection, unspecified: Secondary | ICD-10-CM | POA: Diagnosis not present

## 2022-05-18 DIAGNOSIS — D649 Anemia, unspecified: Secondary | ICD-10-CM | POA: Diagnosis not present

## 2022-05-23 DIAGNOSIS — B009 Herpesviral infection, unspecified: Secondary | ICD-10-CM | POA: Diagnosis not present

## 2022-05-23 DIAGNOSIS — S36039S Unspecified laceration of spleen, sequela: Secondary | ICD-10-CM | POA: Diagnosis not present

## 2022-05-23 DIAGNOSIS — Z86718 Personal history of other venous thrombosis and embolism: Secondary | ICD-10-CM | POA: Diagnosis not present

## 2022-05-25 ENCOUNTER — Telehealth: Payer: Self-pay

## 2022-05-25 DIAGNOSIS — E43 Unspecified severe protein-calorie malnutrition: Secondary | ICD-10-CM | POA: Diagnosis not present

## 2022-05-25 DIAGNOSIS — Z09 Encounter for follow-up examination after completed treatment for conditions other than malignant neoplasm: Secondary | ICD-10-CM | POA: Diagnosis not present

## 2022-05-25 DIAGNOSIS — Z9081 Acquired absence of spleen: Secondary | ICD-10-CM | POA: Diagnosis not present

## 2022-05-25 NOTE — Telephone Encounter (Signed)
I spoke with the patient daughter to let her know unfortunately our schedule is full and unable to adjust her mom pacemaker at this time but to keep the 06/08/22 appointment.

## 2022-05-26 DIAGNOSIS — I10 Essential (primary) hypertension: Secondary | ICD-10-CM | POA: Diagnosis not present

## 2022-05-26 DIAGNOSIS — R8290 Unspecified abnormal findings in urine: Secondary | ICD-10-CM | POA: Diagnosis not present

## 2022-05-29 DIAGNOSIS — I10 Essential (primary) hypertension: Secondary | ICD-10-CM | POA: Diagnosis not present

## 2022-05-29 DIAGNOSIS — N3090 Cystitis, unspecified without hematuria: Secondary | ICD-10-CM | POA: Diagnosis not present

## 2022-05-30 NOTE — Progress Notes (Signed)
Remote pacemaker transmission.   

## 2022-06-02 DIAGNOSIS — I10 Essential (primary) hypertension: Secondary | ICD-10-CM | POA: Diagnosis not present

## 2022-06-02 DIAGNOSIS — N3090 Cystitis, unspecified without hematuria: Secondary | ICD-10-CM | POA: Diagnosis not present

## 2022-06-05 NOTE — Progress Notes (Addendum)
Cardiology Office Note Date:  06/08/2022  Patient ID:  Stormy, Connon 1933-04-22, MRN [id number] PCP:  [facility]  Cardiologist:  Dr. Cristal Deer Electrophysiologist: Dr. Lalla Brothers    Chief Complaint:  post hospital, RA sensing evaluation  History of Present Illness: Sarah [name] is a 89 y.o. female with history of CHB w/PPM, PE/DVT, stroke, HTN, HLD, hypothyroidism, breast Ca (2013), angioimmunoblastic T-cell lymphoma, chronic thrombocytopenia, dementia,   Hospitalized 04/23/22 after a mechanical fall suffering a scalp and splenic laceration > eliquis reversed > splenectomy 12/3 Post-op ileus DVT LUE > placed back on OAC  UTI Eventually/finally discharged 05/06/22.  Device clinic noted 12/27 in remote noted possible RA undersensing, low amplitude P waves and given an appt to come in. CXR 04/23/22 lead position looks ok  TODAY She is accompanied by her daughter Still at SNF/rehab, making slow progress, participating in rehab. Unclear mechanism of her fall she cannot remember.  Unclear if was syncope or another mechanism, did hit her head, also has some degree of dementia. She has not had further falls, but had 3 falls in the prior few months No reports of any CP, palpitations, SOB    Device information [employer] dual chamber PPM implanted 11/01/21   Past Medical History:  Diagnosis Date   Allergy    codeine, thiazides   Anxiety    new dx   Arrhythmia    Arthritis    Atrioventricular block, Mobitz type 1, Wenckebach    Breast cancer (HCC) 03/14/2012   bx=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,ER/PR=+,upper inner quad   Cancer (HCC)    breast   Cataract    DVT (deep venous thrombosis) (HCC) 02/2019   left leg   Dyspnea    Edema    both legs feet and toe, abdomen   Glaucoma    laser treated years ago   HOH (hard of hearing)    Hyperlipemia    Hypertension    Hypothyroidism    Pancreatic cyst    benign   PONV  (postoperative nausea and vomiting)    Pulmonary embolism (HCC) 02/2019   bilateral    Radiation 06/11/2012-07/12/2012   17 sessions 4250 cGy, 3 sessions 750 cGy   Vertigo    Wears glasses    Wears partial dentures    partial upper    Past Surgical History:  Procedure Laterality Date   ABDOMINAL HYSTERECTOMY  1966   1/2 ovary left in    BREAST SURGERY  1992   lumpectomy-lt   CATARACT EXTRACTION     b/l   COLONOSCOPY     EXCISION MASS NECK Left 03/17/2019    EXCISION MASS NECK (Left Neck)   EXCISION MASS NECK Left 03/17/2019   Procedure: EXCISION MASS NECK;  Surgeon: Graylin Shiver, MD;  Location: [facility] OR;  Service: ENT;  Laterality: Left;   EYE SURGERY Bilateral    bilateral cataract removal   IR IMAGING GUIDED PORT INSERTION  04/23/2019   JOINT REPLACEMENT  2013   rt total knee   JOINT REPLACEMENT  1995   lt total knee   LAPAROTOMY N/A 04/23/2022   Procedure: EXPLORATORY LAPAROTOMY, SPLENECTOMY;  Surgeon: Abigail Miyamoto, MD;  Location: [facility] OR;  Service: General;  Laterality: N/A;   PACEMAKER IMPLANT N/A 11/01/2021   Procedure: PACEMAKER IMPLANT;  Surgeon: Lanier Prude, MD;  Location: [facility] INVASIVE [facility];  Service: Cardiovascular;  Laterality: N/A;   PARTIAL MASTECTOMY WITH NEEDLE LOCALIZATION  04/09/2012  Procedure: PARTIAL MASTECTOMY WITH NEEDLE LOCALIZATION;  Surgeon: Ernestene Mention, MD;  Location: [facility];  Service: General;  Laterality: Right;   SCALP LACERATION REPAIR N/A 04/23/2022   Procedure: SCALP LACERATION REPAIR;  Surgeon: Abigail Miyamoto, MD;  Location: [facility] OR;  Service: General;  Laterality: N/A;   SKIN BIOPSY Left 03/17/2019   LEFT THIGH   SKIN BIOPSY Left 03/17/2019   Procedure: Skin Biopsy Left Thigh;  Surgeon: Graylin Shiver, MD;  Location: [facility] OR;  Service: ENT;  Laterality: Left;   TONSILLECTOMY     TOTAL KNEE ARTHROPLASTY  08/16/2011   Procedure: TOTAL KNEE ARTHROPLASTY;  Surgeon: Loreta Ave, MD;  [facility]  OR;  Service: Orthopedics;  Laterality: Right;    Current Outpatient Medications  Medication Sig Dispense Refill   acetaminophen (TYLENOL) 500 MG tablet Take 1,000 mg by mouth every 6 (six) hours as needed for moderate pain.     amLODipine (NORVASC) 2.5 MG tablet Take 2.5 mg by mouth daily.     apixaban (ELIQUIS) 5 MG TABS tablet Take 1 tablet (5 mg total) by mouth 2 (two) times daily. 60 tablet    atorvastatin (LIPITOR) 20 MG tablet Take 1 tablet (20 mg total) by mouth daily. (Patient taking differently: Take 20 mg by mouth every evening.) 30 tablet 3   carvedilol (COREG) 3.125 MG tablet Take 3.125 mg by mouth 2 (two) times daily with a meal.     citalopram (CELEXA) 10 MG tablet Take 10 mg by mouth daily.     feeding supplement (ENSURE ENLIVE / ENSURE PLUS) LIQD Take 237 mLs by mouth 2 (two) times daily between meals. 237 mL 12   furosemide (LASIX) 20 MG tablet Take 20-40 mg by mouth See admin instructions. 40 mg on Monday,Wednesday and Friday 20 mg on Tuesday,Thursday,Saturday and sunday     levothyroxine (SYNTHROID) 25 MCG tablet Take 25 mcg by mouth daily before breakfast.      lidocaine (LIDODERM) 5 % Place 1 patch onto the skin daily. Remove & Discard patch within 12 hours or as directed by MD 30 patch 0   lidocaine-prilocaine (EMLA) cream Apply to affected area once (Patient taking differently: Apply 1 application  topically See admin instructions. Apply to port site prior to port access) 30 g 3   nitrofurantoin, macrocrystal-monohydrate, (MACROBID) 100 MG capsule Take 1 capsule (100 mg total) by mouth every 12 (twelve) hours.     omeprazole (PRILOSEC) 20 MG capsule Take 1 capsule (20 mg total) by mouth daily. 30 capsule 0   sucralfate (CARAFATE) 1 GM/10ML suspension Take 1 g by mouth 4 (four) times daily -  with meals and at bedtime.     zolpidem (AMBIEN) 5 MG tablet Take 5 mg by mouth at bedtime.     No current facility-administered medications for this visit.    Allergies:    Alendronate sodium, Amoxicillin-pot clavulanate, Codeine, Lisinopril, Septra [sulfamethoxazole-trimethoprim], Thiazide-type diuretics, and Latex   Social History:  The patient  reports that she has never smoked. She has never used smokeless tobacco. She reports that she does not drink alcohol and does not use drugs.   Family History:  The patient's family history includes Cancer in her maternal aunt; Heart disease in her father.  ROS:  Please see the history of present illness.    All other systems are reviewed and otherwise negative.   PHYSICAL EXAM:  VS:  BP 110/80   Pulse 62   Ht 5\' 4"  (1.626 m)  Wt 105 lb 9.6 oz (47.9 kg) Comment: Per patient's daughter. pt not able to stand wheelchair  SpO2 95%   BMI 18.13 kg/m  BMI: Body mass index is 18.13 kg/m. Thin, eldery female, in no acute distress HEENT: normocephalic, atraumatic Neck: no JVD, carotid bruits or masses Cardiac:   RRR; no significant murmurs, no rubs, or gallops Lungs:  CTA b/l, no wheezing, rhonchi or rales Abd: soft, nontender MS: no deformity, advanced atrophy Ext:  no edema Skin: warm and dry, no rash Neuro:  No gross deficits appreciated Psych: euthymic mood, full affect  PPM site is stable, no tethering or discomfort, she is quite thin, skin looks good, no thinning   EKG:  Done today and reviewed by myself shows  Done to help with pacer interrogation Baseline: AV paced, no clear P waves (A depolarization) Unchanged with increased A output to 4.8/.05 and 4.8/1.0 VVI 50 with no clear P waves, , ?? AFib While on the EKG machine intermittently appeared to have a conducted narrow complex beat though unable to catch it on paper  04/23/22, hospital is SR/ paced (clearly)  Device interrogation done today and reviewed by myself:  With Industry support  Battery and RV lead measurements are good RA lead impedance is stable A lead sensing by the device is 0.1 (not seen visually) Unable to confirm capture in the A  by EGMs, though initially suspected to have A depolarization, was intermittent and unable to see on surface EKG   05/16/21: TTE 1. Left ventricular ejection fraction, by estimation, is 55 to 60%. The  left ventricle has normal function. The left ventricle has no regional  wall motion abnormalities. There is mild left ventricular hypertrophy.  Left ventricular diastolic parameters  are indeterminate.   2. Right ventricular systolic function is normal. The right ventricular  size is mildly enlarged. There is normal pulmonary artery systolic  pressure.   3. Left atrial size was severely dilated.   4. Right atrial size was severely dilated.   5. The mitral valve is normal in structure. Trivial mitral valve  regurgitation. No evidence of mitral stenosis.   6. Tricuspid valve regurgitation is mild to moderate.   7. The aortic valve is normal in structure. Aortic valve regurgitation is  trivial. No aortic stenosis is present.   8. Aortic dilatation noted. There is borderline dilatation of the  ascending aorta, measuring 39 mm.   9. The inferior vena cava is dilated in size with >50% respiratory  variability, suggesting right atrial pressure of 8 mmHg.   Recent Labs: 11/01/2021: TSH 1.225 04/29/2022: ALT 20; Magnesium 2.2 05/05/2022: BUN 14; Creatinine, Ser 0.73; Potassium 4.2; Sodium 140 05/06/2022: Hemoglobin 8.8; Platelets 579  04/28/2022: Triglycerides 120   CrCl cannot be calculated (Patient's most recent lab result is older than the maximum 21 days allowed.).   Wt Readings from Last 3 Encounters:  06/08/22 105 lb 9.6 oz (47.9 kg)  04/28/22 111 lb 12.4 oz (50.7 kg)  03/15/22 109 lb 9.6 oz (49.7 kg)     Other studies reviewed: Additional studies/records reviewed today include: summarized above  ASSESSMENT AND PLAN:  PPM As above  She has had a change in P wave sensing and unable to confirm A capture today. She had several episodes labeled AT most on Dec 25th, , none since  12/27 that appear to be an unusual noise signal. No noise could be produced today   She does have hx of measured R waves going back through remotes/prior interrogations,  though none today. Her RV lead is stable She did have a couple narrow complex beats while on the EKG machine (persumed conducted)  She had a significant fall and changes seem to correlate with this timing on a semi-new system, though has not had gross lead dislodgement by CXR   Reviewed my findings with Dr. Elberta Fortis and her CXRs Suspects micro dislodgement, no particular programming changes needed today, to review with Dr. Lalla Brothers when able to discuss possible need for A lead revision.  She does not have Afib by hx, ? If this may account for the change, fine AF  ADDEND: 06/09/22: Reviewed case, interrogation, EKGs this morning with Dr.Lambert. Most likely is that she has developed fine Afib with very low amplitude P waves  With no clear associated symptoms, clinical change, and already anticoagulated, would not make any changes or pursue lead revision with grossly stable lead position on her admitting EKG after her fall. I will communicate with the patient's daughter, and for  now keep her appointment as scheduled in a couple weeks, +/- programming adjustment, reassess rhythm, EGMs. Sarah Dowse, PA-C    HTN Looks good  Secondary hypercoagulable state PE/DVT hx on Eliquis No reports of bleeding    Disposition: F/u with Dr. Lalla Brothers in a couple weeks, soon if needed.  I will communicate with the patient/daughter.  Current medicines are reviewed at length with the patient today.  The patient did not have any concerns regarding medicines.  Sarah Fredrickson, PA-C 06/08/2022 4:43 PM     [facility] [address] [place] [address] [phone] (office)  [phone] (fax)

## 2022-06-08 ENCOUNTER — Ambulatory Visit: Payer: Medicare Other | Attending: Physician Assistant | Admitting: Physician Assistant

## 2022-06-08 ENCOUNTER — Encounter: Payer: Self-pay | Admitting: Physician Assistant

## 2022-06-08 VITALS — BP 110/80 | HR 62 | Ht 64.0 in | Wt 105.6 lb

## 2022-06-08 DIAGNOSIS — I10 Essential (primary) hypertension: Secondary | ICD-10-CM | POA: Diagnosis not present

## 2022-06-08 DIAGNOSIS — Z950 Presence of cardiac pacemaker: Secondary | ICD-10-CM | POA: Insufficient documentation

## 2022-06-08 DIAGNOSIS — Z23 Encounter for immunization: Secondary | ICD-10-CM | POA: Diagnosis not present

## 2022-06-08 DIAGNOSIS — R4182 Altered mental status, unspecified: Secondary | ICD-10-CM | POA: Diagnosis not present

## 2022-06-08 LAB — CUP PACEART INCLINIC DEVICE CHECK
Date Time Interrogation Session: 20240118172946
Implantable Lead Connection Status: 753985
Implantable Lead Connection Status: 753985
Implantable Lead Implant Date: 20230613
Implantable Lead Implant Date: 20230613
Implantable Lead Location: 753859
Implantable Lead Location: 753860
Implantable Lead Model: 377
Implantable Lead Model: 377
Implantable Lead Serial Number: 8000826615
Implantable Lead Serial Number: 8000886802
Implantable Pulse Generator Implant Date: 20230613
Pulse Gen Model: 407145
Pulse Gen Serial Number: 70422578

## 2022-06-08 NOTE — Patient Instructions (Addendum)
Medication Instructions:   Your physician recommends that you continue on your current medications as directed. Please refer to the Current Medication list given to you today.   *If you need a refill on your cardiac medications before your next appointment, please call your pharmacy*   Lab Work: .NONE ORDERED  TODAY    If you have labs (blood work) drawn today and your tests are completely normal, you will receive your results only by: MyChart Message (if you have MyChart) OR A paper copy in the mail If you have any lab test that is abnormal or we need to change your treatment, we will call you to review the results.   Testing/Procedures: NONE ORDERED  TODAY    Follow-Up: At [facility], you and your health needs are our priority.  As part of our continuing mission to provide you with exceptional heart care, we have created designated Provider Care Teams.  These Care Teams include your primary Cardiologist (physician) and Advanced Practice Providers (APPs -  Physician Assistants and Nurse Practitioners) who all work together to provide you with the care you need, when you need it.  We recommend signing up for the patient portal called "MyChart".  Sign up information is provided on this After Visit Summary.  MyChart is used to connect with patients for Virtual Visits (Telemedicine).  Patients are able to view lab/test results, encounter notes, upcoming appointments, etc.  Non-urgent messages can be sent to your provider as well.   To learn more about what you can do with MyChart, go to [email or web address].    Your next appointment:   2-3 week(s)  Provider:   You may see Lanier Prude, MD  ONLY    Other Instructions

## 2022-06-12 ENCOUNTER — Other Ambulatory Visit: Payer: Self-pay

## 2022-06-12 DIAGNOSIS — C8440 Peripheral T-cell lymphoma, not classified, unspecified site: Secondary | ICD-10-CM

## 2022-06-12 NOTE — Addendum Note (Signed)
Addended by: Oleta Mouse on: 06/12/2022 08:23 AM   Modules accepted: Orders

## 2022-06-13 ENCOUNTER — Inpatient Hospital Stay: Payer: Medicare Other | Attending: Hematology | Admitting: Hematology

## 2022-06-13 ENCOUNTER — Inpatient Hospital Stay: Payer: Medicare Other

## 2022-06-13 ENCOUNTER — Other Ambulatory Visit: Payer: Self-pay

## 2022-06-13 VITALS — BP 114/61 | HR 58 | Temp 97.3°F | Resp 18

## 2022-06-13 DIAGNOSIS — G629 Polyneuropathy, unspecified: Secondary | ICD-10-CM | POA: Diagnosis not present

## 2022-06-13 DIAGNOSIS — Z452 Encounter for adjustment and management of vascular access device: Secondary | ICD-10-CM | POA: Insufficient documentation

## 2022-06-13 DIAGNOSIS — Z7901 Long term (current) use of anticoagulants: Secondary | ICD-10-CM | POA: Diagnosis not present

## 2022-06-13 DIAGNOSIS — C8440 Peripheral T-cell lymphoma, not classified, unspecified site: Secondary | ICD-10-CM

## 2022-06-13 DIAGNOSIS — Z95828 Presence of other vascular implants and grafts: Secondary | ICD-10-CM

## 2022-06-13 DIAGNOSIS — I4891 Unspecified atrial fibrillation: Secondary | ICD-10-CM | POA: Insufficient documentation

## 2022-06-13 DIAGNOSIS — Z5111 Encounter for antineoplastic chemotherapy: Secondary | ICD-10-CM

## 2022-06-13 DIAGNOSIS — C865 Angioimmunoblastic T-cell lymphoma: Secondary | ICD-10-CM | POA: Diagnosis not present

## 2022-06-13 DIAGNOSIS — I2699 Other pulmonary embolism without acute cor pulmonale: Secondary | ICD-10-CM | POA: Insufficient documentation

## 2022-06-13 LAB — CBC WITH DIFFERENTIAL (CANCER CENTER ONLY)
Abs Immature Granulocytes: 0.02 10*3/uL (ref 0.00–0.07)
Basophils Absolute: 0 10*3/uL (ref 0.0–0.1)
Basophils Relative: 1 %
Eosinophils Absolute: 0.2 10*3/uL (ref 0.0–0.5)
Eosinophils Relative: 5 %
HCT: 31.1 % — ABNORMAL LOW (ref 36.0–46.0)
Hemoglobin: 9.6 g/dL — ABNORMAL LOW (ref 12.0–15.0)
Immature Granulocytes: 1 %
Lymphocytes Relative: 12 %
Lymphs Abs: 0.5 10*3/uL — ABNORMAL LOW (ref 0.7–4.0)
MCH: 25.7 pg — ABNORMAL LOW (ref 26.0–34.0)
MCHC: 30.9 g/dL (ref 30.0–36.0)
MCV: 83.2 fL (ref 80.0–100.0)
Monocytes Absolute: 0.6 10*3/uL (ref 0.1–1.0)
Monocytes Relative: 15 %
Neutro Abs: 2.7 10*3/uL (ref 1.7–7.7)
Neutrophils Relative %: 66 %
Platelet Count: 367 10*3/uL (ref 150–400)
RBC: 3.74 MIL/uL — ABNORMAL LOW (ref 3.87–5.11)
RDW: 15.9 % — ABNORMAL HIGH (ref 11.5–15.5)
WBC Count: 4.1 10*3/uL (ref 4.0–10.5)
nRBC: 1 % — ABNORMAL HIGH (ref 0.0–0.2)

## 2022-06-13 LAB — CMP (CANCER CENTER ONLY)
ALT: 18 U/L (ref 0–44)
AST: 17 U/L (ref 15–41)
Albumin: 3.4 g/dL — ABNORMAL LOW (ref 3.5–5.0)
Alkaline Phosphatase: 102 U/L (ref 38–126)
Anion gap: 6 (ref 5–15)
BUN: 19 mg/dL (ref 8–23)
CO2: 30 mmol/L (ref 22–32)
Calcium: 9.1 mg/dL (ref 8.9–10.3)
Chloride: 106 mmol/L (ref 98–111)
Creatinine: 0.65 mg/dL (ref 0.44–1.00)
GFR, Estimated: >60 mL/min (ref >60)
Glucose, Bld: 113 mg/dL — ABNORMAL HIGH (ref 70–99)
Potassium: 3.2 mmol/L — ABNORMAL LOW (ref 3.5–5.1)
Sodium: 142 mmol/L (ref 135–145)
Total Bilirubin: 0.5 mg/dL (ref 0.3–1.2)
Total Protein: 5.6 g/dL — ABNORMAL LOW (ref 6.5–8.1)

## 2022-06-13 LAB — LACTATE DEHYDROGENASE: LDH: 224 U/L — ABNORMAL HIGH (ref 98–192)

## 2022-06-13 MED ORDER — HEPARIN SOD (PORK) LOCK FLUSH 100 UNIT/ML IV SOLN
500.0000 [IU] | Freq: Once | INTRAVENOUS | Status: AC
  Administered 2022-06-13: 500 [IU]

## 2022-06-13 MED ORDER — SODIUM CHLORIDE 0.9% FLUSH
10.0000 mL | Freq: Once | INTRAVENOUS | Status: AC
  Administered 2022-06-13: 10 mL

## 2022-06-13 NOTE — Progress Notes (Signed)
HEMATOLOGY/ONCOLOGY CLINIC NOTE  Date of Service: 06/13/22   Patient Care Team: [facility] as PCP - General Jodelle Red, MD as PCP - Cardiology (Cardiology) Lanier Prude, MD as PCP - Electrophysiology (Cardiology)  REFERRING PHYSICIAN: Pa, [facility]*  CHIEF COMPLAINTS/PURPOSE OF CONSULTATION:  Follow-up of for continued evaluation and management of angioimmunoblastic T-cell lymphoma  HISTORY OF PRESENTING ILLNESS:  Please see previous notes for details on initial presentation  INTERVAL HISTORY:  [name] is a 89 y.o. female who is here from her skilled nursing facility with her daughter for follow-up of her angioimmunoblastic T-cell lymphoma.  Patient was last seen by me on 02/08/2022 and she was doing well overall.   Patient is accompanied by his daughter during this visit. She notes she has not been doing well since our last visit. She notes that she had a recent fall this past December 2023 where she hit her head and lost a lot of blood. She also had to have splenectomy due to the fall causing a splenic laceration. Her spleen did not show any lymphoma. She also had abdominal surgery. Patient is unsure what caused her to fall.  Patient's daughter notes that the patient is still recovering from her splenectomy and other surgery from her last fall. She is currently staying at rehab facility. She has had her post splenectomy vacations.  Patient's daughter notes she has an cardiologist appointment this Monday 06/19/2021 regarding pacemaker and atrial fibrillation.   She denies fever, chills, night sweats, skin rashes, urinary problems, infection issue, loss of appetite, abdominal pain, chest pain, back pain, or leg swelling. She complains of occasional rectal bleeding.  Patient notes she is eating well, but her daughter reports she has not been eating well.   Patient is still having hard time walking and standing after her  discharge.   MEDICAL HISTORY:  Past Medical History:  Diagnosis Date   Allergy    codeine, thiazides   Anxiety    new dx   Arrhythmia    Arthritis    Atrioventricular block, Mobitz type 1, Wenckebach    Breast cancer (HCC) 03/14/2012   bx=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,ER/PR=+,upper inner quad   Cancer (HCC)    breast   Cataract    DVT (deep venous thrombosis) (HCC) 02/2019   left leg   Dyspnea    Edema    both legs feet and toe, abdomen   Glaucoma    laser treated years ago   HOH (hard of hearing)    Hyperlipemia    Hypertension    Hypothyroidism    Pancreatic cyst    benign   PONV (postoperative nausea and vomiting)    Pulmonary embolism (HCC) 02/2019   bilateral    Radiation 06/11/2012-07/12/2012   17 sessions 4250 cGy, 3 sessions 750 cGy   Vertigo    Wears glasses    Wears partial dentures    partial upper     SURGICAL HISTORY: Past Surgical History:  Procedure Laterality Date   ABDOMINAL HYSTERECTOMY  1966   1/2 ovary left in    BREAST SURGERY  1992   lumpectomy-lt   CATARACT EXTRACTION     b/l   COLONOSCOPY     EXCISION MASS NECK Left 03/17/2019    EXCISION MASS NECK (Left Neck)   EXCISION MASS NECK Left 03/17/2019   Procedure: EXCISION MASS NECK;  Surgeon: Graylin Shiver, MD;  Location: [facility] OR;  Service: ENT;  Laterality: Left;  EYE SURGERY Bilateral    bilateral cataract removal   IR IMAGING GUIDED PORT INSERTION  04/23/2019   JOINT REPLACEMENT  2013   rt total knee   JOINT REPLACEMENT  1995   lt total knee   LAPAROTOMY N/A 04/23/2022   Procedure: EXPLORATORY LAPAROTOMY, SPLENECTOMY;  Surgeon: Abigail Miyamoto, MD;  Location: [facility] OR;  Service: General;  Laterality: N/A;   PACEMAKER IMPLANT N/A 11/01/2021   Procedure: PACEMAKER IMPLANT;  Surgeon: Lanier Prude, MD;  [facility];  Service: Cardiovascular;  Laterality: N/A;   PARTIAL MASTECTOMY WITH NEEDLE LOCALIZATION  04/09/2012   Procedure:  PARTIAL MASTECTOMY WITH NEEDLE LOCALIZATION;  Surgeon: Ernestene Mention, MD;  Location: [facility];  Service: General;  Laterality: Right;   SCALP LACERATION REPAIR N/A 04/23/2022   Procedure: SCALP LACERATION REPAIR;  Surgeon: Abigail Miyamoto, MD;  Location: [facility] OR;  Service: General;  Laterality: N/A;   SKIN BIOPSY Left 03/17/2019   LEFT THIGH   SKIN BIOPSY Left 03/17/2019   Procedure: Skin Biopsy Left Thigh;  Surgeon: Graylin Shiver, MD;  Location: [facility] OR;  Service: ENT;  Laterality: Left;   TONSILLECTOMY     TOTAL KNEE ARTHROPLASTY  08/16/2011   Procedure: TOTAL KNEE ARTHROPLASTY;  Surgeon: Loreta Ave, MD;  Location: [facility] OR;  Service: Orthopedics;  Laterality: Right;     SOCIAL HISTORY: Social History   Socioeconomic History   Marital status: Widowed    Spouse name: Not on file   Number of children: 2   Years of education: Not on file   Highest education level: Not on file  Occupational History   Occupation: Retired    Comment: Social research officer, government   Tobacco Use   Smoking status: Never   Smokeless tobacco: Never  Vaping Use   Vaping Use: Never used  Substance and Sexual Activity   Alcohol use: No   Drug use: No   Sexual activity: Not Currently  Other Topics Concern   Not on file  Social History Narrative   Not on file   Social Determinants of Health   Financial Resource Strain: Not on file  Food Insecurity: Not on file  Transportation Needs: Not on file  Physical Activity: Not on file  Stress: Not on file  Social Connections: Not on file  Intimate Partner Violence: Not on file     FAMILY HISTORY: Family History  Problem Relation Age of Onset   Heart disease Father    Cancer Maternal Aunt        stomach     ALLERGIES:   is allergic to alendronate sodium, amoxicillin-pot clavulanate, codeine, lisinopril, septra [sulfamethoxazole-trimethoprim], thiazide-type diuretics, and latex.   MEDICATIONS:  Current Outpatient Medications   Medication Sig Dispense Refill   acetaminophen (TYLENOL) 500 MG tablet Take 1,000 mg by mouth every 6 (six) hours as needed for moderate pain.     amLODipine (NORVASC) 2.5 MG tablet Take 2.5 mg by mouth daily.     apixaban (ELIQUIS) 5 MG TABS tablet Take 1 tablet (5 mg total) by mouth 2 (two) times daily. 60 tablet    atorvastatin (LIPITOR) 20 MG tablet Take 1 tablet (20 mg total) by mouth daily. (Patient taking differently: Take 20 mg by mouth every evening.) 30 tablet 3   carvedilol (COREG) 3.125 MG tablet Take 3.125 mg by mouth 2 (two) times daily with a meal.     citalopram (CELEXA) 10 MG tablet Take 10 mg by mouth daily.  feeding supplement (ENSURE ENLIVE / ENSURE PLUS) LIQD Take 237 mLs by mouth 2 (two) times daily between meals. 237 mL 12   furosemide (LASIX) 20 MG tablet Take 20-40 mg by mouth See admin instructions. 40 mg on Monday,Wednesday and Friday 20 mg on Tuesday,Thursday,Saturday and sunday     levothyroxine (SYNTHROID) 25 MCG tablet Take 25 mcg by mouth daily before breakfast.      lidocaine (LIDODERM) 5 % Place 1 patch onto the skin daily. Remove & Discard patch within 12 hours or as directed by MD 30 patch 0   lidocaine-prilocaine (EMLA) cream Apply to affected area once (Patient taking differently: Apply 1 application  topically See admin instructions. Apply to port site prior to port access) 30 g 3   nitrofurantoin, macrocrystal-monohydrate, (MACROBID) 100 MG capsule Take 1 capsule (100 mg total) by mouth every 12 (twelve) hours.     omeprazole (PRILOSEC) 20 MG capsule Take 1 capsule (20 mg total) by mouth daily. 30 capsule 0   sucralfate (CARAFATE) 1 GM/10ML suspension Take 1 g by mouth 4 (four) times daily -  with meals and at bedtime.     zolpidem (AMBIEN) 5 MG tablet Take 5 mg by mouth at bedtime.     No current facility-administered medications for this visit.     REVIEW OF SYSTEMS:   10 Point review of Systems was done is negative except as noted  above.  PHYSICAL EXAMINATION:  .BP 114/61   Pulse (!) 58   Temp (!) 97.3 F (36.3 C)   Resp 18   SpO2 99%  NAD GENERAL:alert, in no acute distress and comfortable SKIN: no acute rashes, no significant lesions EYES: conjunctiva are pink and non-injected, sclera anicteric NECK: supple, no JVD LYMPH:  no palpable lymphadenopathy in the cervical, axillary or inguinal regions LUNGS: clear to auscultation b/l with normal respiratory effort HEART: regular rate & rhythm ABDOMEN:  normoactive bowel sounds , non tender, not distended. Extremity: no pedal edema PSYCH: alert & oriented x 3 with fluent speech NEURO: no focal motor/sensory deficits  Exam performed in chair.  LABORATORY DATA:  I have reviewed the data as listed     Latest Ref Rng & Units 06/13/2022    1:04 PM 05/06/2022    4:20 AM 05/05/2022    5:05 AM  CBC  WBC 4.0 - 10.5 K/uL 4.1  10.6  12.1   Hemoglobin 12.0 - 15.0 g/dL 9.6  8.8  9.1   Hematocrit 36.0 - 46.0 % 31.1  28.2  29.0   Platelets 150 - 400 K/uL 367  579  484        Latest Ref Rng & Units 06/13/2022    1:04 PM 05/05/2022    5:05 AM 05/04/2022    5:15 AM  CMP  Glucose 70 - 99 mg/dL 113  92  101   BUN 8 - 23 mg/dL 19  14  12    Creatinine 0.44 - 1.00 mg/dL 0.65  0.73  0.66   Sodium 135 - 145 mmol/L 142  140  141   Potassium 3.5 - 5.1 mmol/L 3.2  4.2  3.7   Chloride 98 - 111 mmol/L 106  109  110   CO2 22 - 32 mmol/L 30  21  24    Calcium 8.9 - 10.3 mg/dL 9.1  8.7  8.5   Total Protein 6.5 - 8.1 g/dL 5.6     Total Bilirubin 0.3 - 1.2 mg/dL 0.5     Alkaline Phos 38 - 126  U/L 102     AST 15 - 41 U/L 17     ALT 0 - 44 U/L 18      . Lab Results  Component Value Date   LDH 224 (H) 06/13/2022      04/08/2019 NM PET Image Initial (PI) Skull Base To Thigh (Accession [id number])  04/04/2019 PATHOLOGY   04/07/2019 ECHOCARDIOGRAM  03/28/2019 PATHOLOGY   03/06/2019 CT ANGIOGRAPHY CHEST WITH CONTRAST (Accession [id number])  04/07/2019  ECHOCARDIOGRAM  03/06/2019 ECHOCARDIOGRAM     03/06/2019 PORTABLE CHEST 1 VIEW (Accession [id number])    02/27/2019 CT CHEST, ABDOMEN, AND PELVIS WITH CONTRAST (Accession [id number])    02/17/2019 CHEST XRAY - 2 VIEW (Accession [id number])    RADIOGRAPHIC STUDIES: I have personally reviewed the radiological images as listed and agreed with the findings in the report. CUP PACEART INCLINIC DEVICE CHECK  Result Date: 06/08/2022 Pacemaker check in clinic. Normal device function no high ventricular rates noted. Estimated longevity _9 years__. Patient enrolled in remote follow-up. Patient education completed. see office note from today for full discussion on device findings Pacemaker check in clinic. Normal device function no high ventricular rates noted. Estimated longevity _9 years__. Patient enrolled in remote follow-up. Patient education completed. see office note from today for full discussion on device findings    ASSESSMENT & PLAN:   Sarah [name] is a 89 y.o. female with:  1. 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 SKIN, LEFT THIGH, BIOPSY: - Involvement by angioimmunoblastic T-cell lymphoma." -04/07/2019 Echocardiogram - normal EF -04/08/2019 NM PET Image Initial (PI) Skull Base To Thigh (Accession [id number]) revealed "1. Widespread hypermetabolic adenopathy in the neck, chest, and abdomen/pelvis, primarily in the Deauville 4 and Deauville 5 range. There is also hypermetabolic activity along the posterior nasopharynx in lingual tonsillar regions potentially indicating sites of involvement. 2. The subtle hypodensities in the spine and bony pelvis are not appreciably hypermetabolic may be incidental, surveillance is suggested. 3. Bilateral thyroid activity but especially on the left. Probably from thyroiditis. 4. A 6 mm  subpleural nodule anteriorly in the right lower lobe not appreciably hypermetabolic but is below sensitive PET-CT size thresholds and merits surveillance. 5. Mildly accentuated diffuse splenic activity without splenomegaly or focal splenic lesion identified. 6. Other imaging findings of potential clinical significance: Aortic Atherosclerosis (ICD10-I70.0). Coronary atherosclerosis. Large right and small left pleural effusions. Moderate cardiomegaly. Small pericardial effusion. Mild mesenteric edema. Large left renal cyst. Fullness of both renal collecting systems with suspected nonobstructive left nephrolithiasis. Degenerative grade 1 anterolisthesis at L4-5 at L5-S1. Trace pelvic ascites." -05/28/2019 PET/CT ([id number]) revealed "Complete metabolic response to therapy". -09/16/2019 PET/CT ([id number]) revealed "1. Unfortunately evidence of lymphoma recurrence at sites of previous hypermetabolic adenopathy. Hypermetabolic lymphoid tissue is new from comparison PET-CT 05/28/2019 but at same locations as PET-CT 04/08/2019. The number of metastatic lymph nodes is less than 04/08/2019. Lymphoid tissue at the RIGHT skull base is larger. 2. Three foci of hypermetabolic recurrence are present, lymphoid tissue at the RIGHT base of skull, hypermetabolic RIGHT hilar lymph node and hypermetabolic lymph node in the porta hepatis." -11/25/2019 PET/CT ([id number]) revealed "1. Marked interval improvement in the hypermetabolic disease identified previously in the posterior right nasopharynx, compatible with Deauville 4 today. 2. Decreased hypermetabolism in the right hilar and porta hepatis lymph nodes identified previously (Deauville 3). No new sites of hypermetabolic disease in the chest, abdomen, or pelvis. 3. Similar diffuse thyroid uptake, left greater  than right. 4. No substantial change in right-sided pulmonary nodules." -04/09/2020 PET/CT ([id number]) revealed "No evidence progressive lymphoma."  2. Pulmonary  Embolism -extensive likely related to extensive malignancy  3. H/o Breast Cancer  -The patient had bilateral diagnostic  mammography at [facility] 07/11/2011. This  showed some calcifications in the right  breast, which seemed a little bit more  prominent than prior. In the left breast  there was an area of possible  architectural distortion. However left  breast ultrasound the same day showed  no abnormality. 6 month followup was  suggested, and on 02/28/2012 the  patient again had bilateral diagnostic  mammography, now with right  ultrasonography. The microcalcifications  in the right breast appeared increased.  Ultrasound showed a hypoechoic lesion  measuring 5 mm, with no associated  shadowing. This had been previously  noted and appeared unchanged. Anterior  to that there was a small hypoechoic  mass measuring 4 mm in diameter.  There was felt to be suspicious, and on  03/14/2012 the patient underwent biopsy  of the right breast mass, showing (SAA  [id number]) ductal carcinoma in situ,  intermediate grade, estrogen receptor  100% and progesterone receptor 100%  positive.    -Bilateral breast MRI was obtained  03/26/2012. This showed only post  biopsy changes in the right breast,  associated with an area of non-masslike  enhancement measuring 2.4 cm. The  left breast was unremarkable, and there  was no enlarged axillary or internal  mammary adenopathy noted.  Accordingly on 04/09/2012 the patient  underwent right lumpectomy, the  pathology (SZA [phone]) showing ductal  carcinoma in situ measuring 2.0 cm,  grade 2, with negative margins, the  closest being 0.3 cm. The patient's  subsequent history is as detailed below.   PLAN: -inpatient records reviewed[ddddddddddddddddddddddddddddddddddddddddddddddddddddddddddd -discussed lab results from today, 06/13/2022, with the patient and her daughter. CBC shows hemoglobin of 9.6 and hematocrit of 31.7. CMP is stable.  LDH borderline elevated likely due to recent  surgiries. -Patient has no clear lab or clinical findings suggestive of significant progression of her angioimmunoblastic T-cell lymphoma. -no indication for additional treatment of the patient's lymphoma at this time. -Review of chart patient was admitted in the hospital in December 2023 due to fall which caused her to have splenectomy.  FOLLOW-UP: RTC with Dr Candise Che with labs in 4 months  The total time spent in the appointment was 30 minutes* .  All of the patient's questions were answered with apparent satisfaction. The patient knows to call the clinic with any problems, questions or concerns.   Wyvonnia Lora MD MS AAHIVMS [facility] Hematology/Oncology Physician [facility]  .*Total Encounter Time as defined by the Centers for Medicare and Medicaid Services includes, in addition to the face-to-face time of a patient visit (documented in the note above) non-face-to-face time: obtaining and reviewing outside history, ordering and reviewing medications, tests or procedures, care coordination (communications with other health care professionals or caregivers) and documentation in the medical record.   I, Param Shah,acting as a scribe for Wyvonnia Lora, MD.  .I have reviewed the above documentation for accuracy and completeness, and I agree with the above. Johney Maine MD

## 2022-06-14 ENCOUNTER — Other Ambulatory Visit: Payer: Self-pay | Admitting: *Deleted

## 2022-06-14 NOTE — Patient Outreach (Signed)
Mrs. Eber resides in [facility] skilled nursing facility. Screening for potential care coordination services as benefit of health plan and Primary Care Provider.   PCP office [facility] has [facility] care management.   Collaboration with Exie Parody, [facility] social worker to make aware Probation officer is following.   Marthenia Rolling, MSN, RN,BSN [place] Acute Care Coordinator [phone] (Direct dial)

## 2022-06-18 NOTE — Progress Notes (Unsigned)
Electrophysiology Office Follow up Visit Note:    Date:  06/18/2022   ID:  Sarah [name], DOB 12-12-1932, MRN [id number]  PCP:  Pa, [place] Cardiologist:  Buford Dresser, MD  [facility] Electrophysiologist:  Vickie Epley, MD    Interval History:    Sarah [name] is a 89 y.o. female who presents for a follow up visit. They were last seen in clinic by Sarah [name] 06/08/2022.   She had a PPM implanted 11/01/2021.  She has poor P wave sensing on her PPM. Her V lead is normal.  Her medical history is complex and includes CHB, PE/DVT, stroke, HTN, HLD, hypothyroidism, breast ca, angioimmunoblastic T cell lymphoma, thrombocytopenia, dementia. She had a fall in 04/2022 leading to a splenic laceration and splenectomy.        Past Medical History:  Diagnosis Date   Allergy    codeine, thiazides   Anxiety    new dx   Arrhythmia    Arthritis    Atrioventricular block, Mobitz type 1, Wenckebach    Breast cancer ([place]) 03/14/2012   bx=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,ER/PR=+,upper inner quad   Cancer (HCC)    breast   Cataract    DVT (deep venous thrombosis) ([place]) 02/2019   left leg   Dyspnea    Edema    both legs feet and toe, abdomen   Glaucoma    laser treated years ago   HOH (hard of hearing)    Hyperlipemia    Hypertension    Hypothyroidism    Pancreatic cyst    benign   PONV (postoperative nausea and vomiting)    Pulmonary embolism ([place]) 02/2019   bilateral    Radiation 06/11/2012-07/12/2012   17 sessions 4250 cGy, 3 sessions 750 cGy   Vertigo    Wears glasses    Wears partial dentures    partial upper    Past Surgical History:  Procedure Laterality Date   ABDOMINAL HYSTERECTOMY  1966   1/2 ovary left in    [place]   lumpectomy-lt   CATARACT EXTRACTION     b/l   COLONOSCOPY     EXCISION MASS NECK Left 03/17/2019    EXCISION MASS NECK (Left Neck)   EXCISION MASS NECK  Left 03/17/2019   Procedure: EXCISION MASS NECK;  Surgeon: Helayne Seminole, MD;  Location: [place] [facility] OR;  Service: ENT;  Laterality: Left;   EYE SURGERY Bilateral    bilateral cataract removal   IR IMAGING GUIDED PORT INSERTION  04/23/2019   JOINT REPLACEMENT  2013   rt total knee   JOINT REPLACEMENT  1995   lt total knee   LAPAROTOMY N/A 04/23/2022   Procedure: EXPLORATORY LAPAROTOMY, SPLENECTOMY;  Surgeon: Coralie Keens, MD;  Location: [place];  Service: General;  Laterality: N/A;   PACEMAKER IMPLANT N/A 11/01/2021   Procedure: PACEMAKER IMPLANT;  Surgeon: Vickie Epley, MD;  Location: [place] CV LAB;  Service: Cardiovascular;  Laterality: N/A;   PARTIAL MASTECTOMY WITH NEEDLE LOCALIZATION  04/09/2012   Procedure: PARTIAL MASTECTOMY WITH NEEDLE LOCALIZATION;  Surgeon: Adin Hector, MD;  Location: [place];  Service: General;  Laterality: Right;   SCALP LACERATION REPAIR N/A 04/23/2022   Procedure: SCALP LACERATION REPAIR;  Surgeon: Coralie Keens, MD;  Location: [place];  Service: General;  Laterality: N/A;   SKIN BIOPSY Left 03/17/2019   LEFT THIGH   SKIN BIOPSY Left 03/17/2019   Procedure: Skin Biopsy  Left Thigh;  Surgeon: Helayne Seminole, MD;  Location: [facility] OR;  Service: ENT;  Laterality: Left;   TONSILLECTOMY     TOTAL KNEE ARTHROPLASTY  08/16/2011   Procedure: TOTAL KNEE ARTHROPLASTY;  Surgeon: Ninetta Lights, MD;  Location: [place];  Service: Orthopedics;  Laterality: Right;    Current Medications: No outpatient medications have been marked as taking for the 06/19/22 encounter (Appointment) with Vickie Epley, MD.     Allergies:   Alendronate sodium, Amoxicillin-pot clavulanate, Codeine, Lisinopril, Septra [sulfamethoxazole-trimethoprim], Thiazide-type diuretics, and Latex   Social History   Socioeconomic History   Marital status: Widowed    Spouse name: Not on file   Number of children: 2   Years of education: Not on file   Highest  education level: Not on file  Occupational History   Occupation: Retired    Comment: Community education officer   Tobacco Use   Smoking status: Never   Smokeless tobacco: Never  Vaping Use   Vaping Use: Never used  Substance and Sexual Activity   Alcohol use: No   Drug use: No   Sexual activity: Not Currently  Other Topics Concern   Not on file  Social History Narrative   Not on file   Social Determinants of Health   Financial Resource Strain: Not on file  Food Insecurity: Not on file  Transportation Needs: Not on file  Physical Activity: Not on file  Stress: Not on file  Social Connections: Not on file     Family History: The patient's family history includes Cancer in her maternal aunt; Heart disease in her father.  ROS:   Please see the history of present illness.    All other systems reviewed and are negative.  EKGs/Labs/Other Studies Reviewed:    The following studies were reviewed today:  06/19/2022 in clinic device interrogation personally reviewed ***   06/08/2022 ECG No visible P waves or atrial capture Suspect underlying AF with v pacing.    04/25/2022 ECG  Clear p waves with v pacing.     EKG:  The ekg ordered today demonstrates ***  Recent Labs: 11/01/2021: TSH 1.225 04/29/2022: Magnesium 2.2 06/13/2022: ALT 18; BUN 19; Creatinine 0.65; Hemoglobin 9.6; Platelet Count 367; Potassium 3.2; Sodium 142  Recent Lipid Panel    Component Value Date/Time   CHOL 189 05/16/2021 0805   TRIG 120 04/28/2022 0518   HDL 76 05/16/2021 0805   CHOLHDL 2.5 05/16/2021 0805   VLDL 15 05/16/2021 0805   LDLCALC 98 05/16/2021 0805    Physical Exam:    VS:  There were no vitals taken for this visit.    Wt Readings from Last 3 Encounters:  06/08/22 105 lb 9.6 oz (47.9 kg)  04/28/22 111 lb 12.4 oz (50.7 kg)  03/15/22 109 lb 9.6 oz (49.7 kg)     GEN: *** Well nourished, well developed in no acute distress CARDIAC: ***RRR, no murmurs, rubs, gallops RESPIRATORY:  Clear  to auscultation without rales, wheezing or rhonchi        ASSESSMENT:    No diagnosis found. PLAN:    In order of problems listed above:  #CHB s/p PPM Device functioning appropriately. Poor P wave sensing due to low amplitude fibrillatory waves.    #AF New diagnosis for her On chronic AC due to a history of DVT/PE.         Total time spent with patient today *** minutes. This includes reviewing records, evaluating the patient and coordinating care.  Medication Adjustments/Labs and Tests Ordered: Current medicines are reviewed at length with the patient today.  Concerns regarding medicines are outlined above.  No orders of the defined types were placed in this encounter.  No orders of the defined types were placed in this encounter.    Signed, Lars Mage, MD, [facility], [facility] 06/18/2022 8:14 PM    Electrophysiology [facility]

## 2022-06-19 ENCOUNTER — Ambulatory Visit: Payer: Medicare Other | Attending: Cardiology | Admitting: Cardiology

## 2022-06-19 ENCOUNTER — Encounter: Payer: Self-pay | Admitting: Hematology

## 2022-06-19 ENCOUNTER — Encounter: Payer: Self-pay | Admitting: Cardiology

## 2022-06-19 VITALS — BP 134/64 | HR 67 | Ht 64.0 in | Wt 105.0 lb

## 2022-06-19 DIAGNOSIS — Z950 Presence of cardiac pacemaker: Secondary | ICD-10-CM | POA: Diagnosis not present

## 2022-06-19 DIAGNOSIS — I4819 Other persistent atrial fibrillation: Secondary | ICD-10-CM | POA: Diagnosis not present

## 2022-06-19 NOTE — Patient Instructions (Signed)
Medication Instructions:  Your physician recommends that you continue on your current medications as directed. Please refer to the Current Medication list given to you today.  *If you need a refill on your cardiac medications before your next appointment, please call your pharmacy*  Follow-Up: At [facility], you and your health needs are our priority.  As part of our continuing mission to provide you with exceptional heart care, we have created designated Provider Care Teams.  These Care Teams include your primary Cardiologist (physician) and Advanced Practice Providers (APPs -  Physician Assistants and Nurse Practitioners) who all work together to provide you with the care you need, when you need it.  Your next appointment:   6 month(s)  Provider:   You will see one of the following Advanced Practice Providers on your designated Care Team:   Tommye Standard, [place]" [place], [place], NP

## 2022-06-19 NOTE — Progress Notes (Signed)
Electrophysiology Office Follow up Visit Note:    Date:  06/19/2022   ID:  Sarah [name], DOB 23-Jun-1932, MRN [id number]  PCP:  Pa, Alex Cardiologist:  Buford Dresser, MD  [facility] Electrophysiologist:  Vickie Epley, MD    Interval History:    Sarah [name] is a 89 y.o. female who presents for a follow up visit. They were last seen in clinic by renee 06/08/2022.   She had a PPM implanted 11/01/2021.  She has poor P wave sensing on her PPM. Her V lead is normal.  Her medical history is complex and includes CHB, PE/DVT, stroke, HTN, HLD, hypothyroidism, breast ca, angioimmunoblastic T cell lymphoma, thrombocytopenia, dementia. She had a fall in 04/2022 leading to a splenic laceration and splenectomy.   Today, she is accompanied by her daughter. I discussed that her recent EKG reveals A fib.  She continues to be in rehab following her fall on December 3rd, 2023. Prior to her fall, she was able to use her walker. She has difficulty standing and her walking is very limited. She takes an Ambien every night. After her fall, she received 9 staples on her head, a splenectomy, blood clots, and UTI.   Her daughter reports that the rehab facility had discontinued her blood pressure medications due to having a low BP at the time.  She denies any chest pain, shortness of breath, or peripheral edema. No lightheadedness, headaches, syncope, orthopnea, or PND.    Past Medical History:  Diagnosis Date   Allergy    codeine, thiazides   Anxiety    new dx   Arrhythmia    Arthritis    Atrioventricular block, Mobitz type 1, Wenckebach    Breast cancer ([place]) 03/14/2012   bx=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,ER/PR=+,upper inner quad   Cancer (HCC)    breast   Cataract    DVT (deep venous thrombosis) ([place]) 02/2019   left leg   Dyspnea    Edema    both legs feet and toe, abdomen   Glaucoma    laser treated  years ago   HOH (hard of hearing)    Hyperlipemia    Hypertension    Hypothyroidism    Pancreatic cyst    benign   PONV (postoperative nausea and vomiting)    Pulmonary embolism ([place]) 02/2019   bilateral    Radiation 06/11/2012-07/12/2012   17 sessions 4250 cGy, 3 sessions 750 cGy   Vertigo    Wears glasses    Wears partial dentures    partial upper    Past Surgical History:  Procedure Laterality Date   ABDOMINAL HYSTERECTOMY  1966   1/2 ovary left in    [place]   lumpectomy-lt   CATARACT EXTRACTION     b/l   COLONOSCOPY     EXCISION MASS NECK Left 03/17/2019    EXCISION MASS NECK (Left Neck)   EXCISION MASS NECK Left 03/17/2019   Procedure: EXCISION MASS NECK;  Surgeon: Helayne Seminole, MD;  Location: [place] OR;  Service: ENT;  Laterality: Left;   EYE SURGERY Bilateral    bilateral cataract removal   IR IMAGING GUIDED PORT INSERTION  04/23/2019   JOINT REPLACEMENT  2013   rt total knee   JOINT REPLACEMENT  1995   lt total knee   LAPAROTOMY N/A 04/23/2022   Procedure: EXPLORATORY LAPAROTOMY, SPLENECTOMY;  Surgeon: Coralie Keens, MD;  Location: [place];  Service: General;  Laterality:  N/A;   PACEMAKER IMPLANT N/A 11/01/2021   Procedure: PACEMAKER IMPLANT;  Surgeon: Vickie Epley, MD;  Location: [place] [facility];  Service: Cardiovascular;  Laterality: N/A;   PARTIAL MASTECTOMY WITH NEEDLE LOCALIZATION  04/09/2012   Procedure: PARTIAL MASTECTOMY WITH NEEDLE LOCALIZATION;  Surgeon: Adin Hector, MD;  Location: [place];  Service: General;  Laterality: Right;   SCALP LACERATION REPAIR N/A 04/23/2022   Procedure: SCALP LACERATION REPAIR;  Surgeon: Coralie Keens, MD;  Location: [place];  Service: General;  Laterality: N/A;   SKIN BIOPSY Left 03/17/2019   LEFT THIGH   SKIN BIOPSY Left 03/17/2019   Procedure: Skin Biopsy Left Thigh;  Surgeon: Helayne Seminole, MD;  Location: [place];  Service: ENT;  Laterality: Left;   TONSILLECTOMY      TOTAL KNEE ARTHROPLASTY  08/16/2011   Procedure: TOTAL KNEE ARTHROPLASTY;  Surgeon: Ninetta Lights, MD;  Location: [place];  Service: Orthopedics;  Laterality: Right;    Current Medications: Current Meds  Medication Sig   acetaminophen (TYLENOL) 500 MG tablet Take 1,000 mg by mouth every 6 (six) hours as needed for moderate pain.   apixaban (ELIQUIS) 5 MG TABS tablet Take 1 tablet (5 mg total) by mouth 2 (two) times daily.   atorvastatin (LIPITOR) 20 MG tablet Take 1 tablet (20 mg total) by mouth daily. (Patient taking differently: Take 20 mg by mouth every evening.)   carvedilol (COREG) 3.125 MG tablet Take 3.125 mg by mouth 2 (two) times daily with a meal.   citalopram (CELEXA) 10 MG tablet Take 10 mg by mouth daily.   feeding supplement (ENSURE ENLIVE / ENSURE PLUS) LIQD Take 237 mLs by mouth 2 (two) times daily between meals.   furosemide (LASIX) 20 MG tablet Take 20-40 mg by mouth See admin instructions. 40 mg on Monday,Wednesday and Friday 20 mg on Tuesday,Thursday,Saturday and sunday   levothyroxine (SYNTHROID) 25 MCG tablet Take 25 mcg by mouth daily before breakfast.    lidocaine (LIDODERM) 5 % Place 1 patch onto the skin daily. Remove & Discard patch within 12 hours or as directed by MD   lidocaine-prilocaine (EMLA) cream Apply to affected area once (Patient taking differently: Apply 1 application  topically See admin instructions. Apply to port site prior to port access)   omeprazole (PRILOSEC) 20 MG capsule Take 1 capsule (20 mg total) by mouth daily.   sucralfate (CARAFATE) 1 GM/10ML suspension Take 1 g by mouth 4 (four) times daily -  with meals and at bedtime.   zolpidem (AMBIEN) 5 MG tablet Take 5 mg by mouth at bedtime.     Allergies:   Alendronate sodium, Amoxicillin-pot clavulanate, Codeine, Lisinopril, Septra [sulfamethoxazole-trimethoprim], Thiazide-type diuretics, and Latex   Social History   Socioeconomic History   Marital status: Widowed    Spouse name: Not on  file   Number of children: 2   Years of education: Not on file   Highest education level: Not on file  Occupational History   Occupation: Retired    Comment: Community education officer   Tobacco Use   Smoking status: Never   Smokeless tobacco: Never  Vaping Use   Vaping Use: Never used  Substance and Sexual Activity   Alcohol use: No   Drug use: No   Sexual activity: Not Currently  Other Topics Concern   Not on file  Social History Narrative   Not on file   Social Determinants of Health   Financial Resource Strain: Not on file  Food Insecurity: Not on file  Transportation Needs: Not on file  Physical Activity: Not on file  Stress: Not on file  Social Connections: Not on file     Family History: The patient's family history includes Cancer in her maternal aunt; Heart disease in her father.  ROS:   Please see the history of present illness.    (+)fall (+limited standing and mobility All other systems reviewed and are negative.  EKGs/Labs/Other Studies Reviewed:    The following studies were reviewed today:  06/19/2022 in clinic device interrogation personally reviewed Fine atrial fibrillation noted on atrial EGM Turned on CLS today Ventricular lead parameter stable   06/08/2022 ECG No visible P waves or atrial capture Suspect underlying AF with v pacing.    04/25/2022 ECG  Clear p waves with v pacing.     EKG:  EKG is personally reviewed. 06/19/22: Atrial fibrillation noted on long rhythm strip with a slow ventricular response around 60 to 65 bpm.  Recent Labs: 11/01/2021: TSH 1.225 04/29/2022: Magnesium 2.2 06/13/2022: ALT 18; BUN 19; Creatinine 0.65; Hemoglobin 9.6; Platelet Count 367; Potassium 3.2; Sodium 142  Recent Lipid Panel    Component Value Date/Time   CHOL 189 05/16/2021 0805   TRIG 120 04/28/2022 0518   HDL 76 05/16/2021 0805   CHOLHDL 2.5 05/16/2021 0805   VLDL 15 05/16/2021 0805   LDLCALC 98 05/16/2021 0805    Physical Exam:    VS:  BP  134/64   Pulse 67   Ht 5\' 4"  (1.626 m)   Wt 105 lb (47.6 kg)   SpO2 95%   BMI 18.02 kg/m     Wt Readings from Last 3 Encounters:  06/19/22 105 lb (47.6 kg)  06/08/22 105 lb 9.6 oz (47.9 kg)  04/28/22 111 lb 12.4 oz (50.7 kg)     GEN: Thin, frail, elderly CARDIAC: RRR, no murmurs, rubs, gallops.  Pacemaker pocket well-healed RESPIRATORY:  Clear to auscultation without rales, wheezing or rhonchi        ASSESSMENT:    1. Cardiac pacemaker in situ    PLAN:    In order of problems listed above:  #Persistent atrial fibrillation New diagnosis.  Likely began around the time of her splenectomy and hospitalization.  Asymptomatic.  Continue Eliquis for stroke prophylaxis/DVT treatment.   #CHB s/p PPM Device functioning appropriately. Poor P wave sensing due to low amplitude fibrillatory waves.    #AF New diagnosis for her On chronic AC due to a history of DVT/PE.   Follow-up: 6 months with APP  Medication Adjustments/Labs and Tests Ordered: Current medicines are reviewed at length with the patient today.  Concerns regarding medicines are outlined above.  Orders Placed This Encounter  Procedures   EKG 12-Lead   No orders of the defined types were placed in this encounter.   I,Mitra Faeizi,acting as a Education administrator for Vickie Epley, MD.,have documented all relevant documentation on the behalf of Vickie Epley, MD,as directed by  Vickie Epley, MD while in the presence of Vickie Epley, MD.  I, Vickie Epley, MD, have reviewed all documentation for this visit. The documentation on 06/19/22 for the exam, diagnosis, procedures, and orders are all accurate and complete.   Signed, Lars Mage, MD, [facility], [facility] 06/19/2022 1:50 PM    Electrophysiology [facility]

## 2022-06-20 DIAGNOSIS — E559 Vitamin D deficiency, unspecified: Secondary | ICD-10-CM | POA: Diagnosis not present

## 2022-06-20 DIAGNOSIS — I10 Essential (primary) hypertension: Secondary | ICD-10-CM | POA: Diagnosis not present

## 2022-06-20 DIAGNOSIS — R4182 Altered mental status, unspecified: Secondary | ICD-10-CM | POA: Diagnosis not present

## 2022-06-20 DIAGNOSIS — R635 Abnormal weight gain: Secondary | ICD-10-CM | POA: Diagnosis not present

## 2022-06-21 ENCOUNTER — Ambulatory Visit (INDEPENDENT_AMBULATORY_CARE_PROVIDER_SITE_OTHER): Payer: Medicare Other | Admitting: Podiatry

## 2022-06-21 ENCOUNTER — Encounter: Payer: Self-pay | Admitting: Podiatry

## 2022-06-21 ENCOUNTER — Ambulatory Visit: Payer: Medicare Other | Admitting: Podiatry

## 2022-06-21 VITALS — BP 156/87

## 2022-06-21 DIAGNOSIS — M79675 Pain in left toe(s): Secondary | ICD-10-CM

## 2022-06-21 DIAGNOSIS — B351 Tinea unguium: Secondary | ICD-10-CM | POA: Diagnosis not present

## 2022-06-21 DIAGNOSIS — G629 Polyneuropathy, unspecified: Secondary | ICD-10-CM

## 2022-06-21 DIAGNOSIS — L84 Corns and callosities: Secondary | ICD-10-CM

## 2022-06-21 DIAGNOSIS — D689 Coagulation defect, unspecified: Secondary | ICD-10-CM | POA: Diagnosis not present

## 2022-06-21 DIAGNOSIS — M79674 Pain in right toe(s): Secondary | ICD-10-CM | POA: Diagnosis not present

## 2022-06-21 NOTE — Progress Notes (Signed)
  Subjective:  Patient ID: Sarah [name], female    DOB: Jun 27, 1932,  MRN: [id number]  Sarah [name] presents to clinic today for at risk foot care with h/o clotting disorder  Chief Complaint  Patient presents with   Nail Problem    Routine foot care   New problem(s): Patient presents with her daughter on today and has had a significant fall since her last visit. She may be moving to [facility]  PCP is Pa, Forensic psychologist.  Allergies  Allergen Reactions   Alendronate Sodium Other (See Comments)    UNK reaction   Amoxicillin-Pot Clavulanate Other (See Comments)    UNK reaction   Codeine Other (See Comments)    Reaction not recalled   Lisinopril Other (See Comments)    UNK reaction   Septra [Sulfamethoxazole-Trimethoprim] Other (See Comments)   Thiazide-Type Diuretics Other (See Comments)    Blurry vision?? (patient stated she has macular degeneration)   Latex Rash    Review of Systems: Negative except as noted in the HPI.  Objective: No changes noted in today's physical examination. Vitals:   06/21/22 1500  BP: (!) 156/87   Sarah [name] is a pleasant 89 y.o. female frail, in NAD. AAO x 3.  Neurovascular Examination: CFT <3 seconds b/l LE. Faintly palpable DP/PT pulses b/l LE. Digital hair absent b/l. Skin temperature gradient WNL b/l. No pain with calf compression b/l. No edema noted b/l. Lower extremity skin temperature gradient within normal limits.  Pt has subjective symptoms of neuropathy. Protective sensation intact 5/5 intact bilaterally with 10g monofilament b/l.  Dermatological:  Pedal integument with normal turgor, texture and tone BLE. No open wounds b/l LE. No interdigital macerations noted b/l LE.   Toenails 2-5 bilaterally and L hallux elongated, discolored, dystrophic, thickened, and crumbly with subungual debris and tenderness to dorsal palpation.   Anonychia noted R hallux. Nailbed(s) epithelialized.     Preulcerative lesion noted L 3rd toe. There is visible subdermal hemorrhage. There is no surrounding erythema, no edema, no drainage, no odor, no fluctuance.  Musculoskeletal:  Normal muscle strength 5/5 to all lower extremity muscle groups bilaterally. Hammertoe(s) noted to the L 2nd toe, L 4th toe, L 5th toe, R 2nd toe, R 3rd toe, R 4th toe, and R 5th toe. Clawtoe deformity L 3rd toe; flexible at DIPJ. She is sitting in a wheelchair today.  Assessment/Plan: 1. Pain due to onychomycosis of toenails of both feet   2. Pre-ulcerative corn or callous   3. Blood clotting disorder ([place])   4. Neuropathy     -Patient's family member present. All questions/concerns addressed on today's visit. -Facility to continue fall precautions and pressure precautions. -Continue supportive shoe gear daily. -Mycotic toenails 1-5 bilaterally were debrided in length and girth with sterile nail nippers and dremel without incident. -Preulcerative lesion pared L 3rd toe utilizing sterile scalpel blade. Total number pared=1. -Patient/POA to call should there be question/concern in the interim.   Return in about 3 months (around 09/19/2022).  Marzetta Board, DPM

## 2022-06-27 ENCOUNTER — Other Ambulatory Visit: Payer: Self-pay | Admitting: *Deleted

## 2022-06-27 NOTE — Patient Outreach (Addendum)
[place] Coordinator follow up. Mrs. Adamec resides in [facility]. Following for care coordination services as benefit of health plan and Primary Care Provider.   Primary Care Provider office  [facility] has [facility] care management.   Secure communication and voicemail left for [facility] social workers to inquire about transition plans.  Addendum: Response received from [facility], [facility] social worker indicating she is unsure of transition plan.   Marthenia Rolling, MSN, RN,BSN [place] Acute [facility] Coordinator [phone] (Direct dial)

## 2022-06-30 ENCOUNTER — Emergency Department: Payer: Medicare Other

## 2022-06-30 ENCOUNTER — Observation Stay
Admission: EM | Admit: 2022-06-30 | Discharge: 2022-07-01 | Disposition: A | Discharge status: Skilled Nursing Facility | Payer: Medicare Other | Attending: Osteopathic Medicine | Admitting: Osteopathic Medicine

## 2022-06-30 DIAGNOSIS — Z79899 Other long term (current) drug therapy: Secondary | ICD-10-CM | POA: Insufficient documentation

## 2022-06-30 DIAGNOSIS — E876 Hypokalemia: Secondary | ICD-10-CM | POA: Diagnosis not present

## 2022-06-30 DIAGNOSIS — I82A12 Acute embolism and thrombosis of left axillary vein: Secondary | ICD-10-CM | POA: Diagnosis not present

## 2022-06-30 DIAGNOSIS — I82622 Acute embolism and thrombosis of deep veins of left upper extremity: Principal | ICD-10-CM | POA: Insufficient documentation

## 2022-06-30 DIAGNOSIS — R2232 Localized swelling, mass and lump, left upper limb: Secondary | ICD-10-CM | POA: Diagnosis present

## 2022-06-30 DIAGNOSIS — R609 Edema, unspecified: Secondary | ICD-10-CM | POA: Diagnosis not present

## 2022-06-30 DIAGNOSIS — Z7901 Long term (current) use of anticoagulants: Secondary | ICD-10-CM | POA: Diagnosis not present

## 2022-06-30 DIAGNOSIS — K625 Hemorrhage of anus and rectum: Secondary | ICD-10-CM

## 2022-06-30 DIAGNOSIS — I739 Peripheral vascular disease, unspecified: Secondary | ICD-10-CM | POA: Diagnosis not present

## 2022-06-30 DIAGNOSIS — E039 Hypothyroidism, unspecified: Secondary | ICD-10-CM | POA: Insufficient documentation

## 2022-06-30 DIAGNOSIS — Z853 Personal history of malignant neoplasm of breast: Secondary | ICD-10-CM | POA: Insufficient documentation

## 2022-06-30 DIAGNOSIS — Z950 Presence of cardiac pacemaker: Secondary | ICD-10-CM | POA: Diagnosis not present

## 2022-06-30 DIAGNOSIS — Z96653 Presence of artificial knee joint, bilateral: Secondary | ICD-10-CM | POA: Diagnosis not present

## 2022-06-30 DIAGNOSIS — I82409 Acute embolism and thrombosis of unspecified deep veins of unspecified lower extremity: Secondary | ICD-10-CM | POA: Diagnosis present

## 2022-06-30 DIAGNOSIS — Z91040 Latex allergy status: Secondary | ICD-10-CM | POA: Insufficient documentation

## 2022-06-30 DIAGNOSIS — I82619 Acute embolism and thrombosis of superficial veins of unspecified upper extremity: Secondary | ICD-10-CM | POA: Diagnosis not present

## 2022-06-30 DIAGNOSIS — M7989 Other specified soft tissue disorders: Secondary | ICD-10-CM | POA: Diagnosis not present

## 2022-06-30 DIAGNOSIS — I10 Essential (primary) hypertension: Secondary | ICD-10-CM | POA: Insufficient documentation

## 2022-06-30 DIAGNOSIS — K649 Unspecified hemorrhoids: Secondary | ICD-10-CM | POA: Diagnosis not present

## 2022-06-30 DIAGNOSIS — D649 Anemia, unspecified: Secondary | ICD-10-CM | POA: Insufficient documentation

## 2022-06-30 DIAGNOSIS — Z8679 Personal history of other diseases of the circulatory system: Secondary | ICD-10-CM | POA: Diagnosis not present

## 2022-06-30 DIAGNOSIS — Z86718 Personal history of other venous thrombosis and embolism: Secondary | ICD-10-CM | POA: Diagnosis not present

## 2022-06-30 DIAGNOSIS — M19032 Primary osteoarthritis, left wrist: Secondary | ICD-10-CM | POA: Diagnosis not present

## 2022-06-30 LAB — CBC WITH DIFFERENTIAL/PLATELET
Abs Immature Granulocytes: 0.1 10*3/uL — ABNORMAL HIGH (ref 0.00–0.07)
Basophils Absolute: 0 10*3/uL (ref 0.0–0.1)
Basophils Relative: 1 %
Eosinophils Absolute: 0.1 10*3/uL (ref 0.0–0.5)
Eosinophils Relative: 1 %
HCT: 36.5 % (ref 36.0–46.0)
Hemoglobin: 10.6 g/dL — ABNORMAL LOW (ref 12.0–15.0)
Immature Granulocytes: 2 %
Lymphocytes Relative: 16 %
Lymphs Abs: 0.8 10*3/uL (ref 0.7–4.0)
MCH: 24 pg — ABNORMAL LOW (ref 26.0–34.0)
MCHC: 29 g/dL — ABNORMAL LOW (ref 30.0–36.0)
MCV: 82.6 fL (ref 80.0–100.0)
Monocytes Absolute: 0.5 10*3/uL (ref 0.1–1.0)
Monocytes Relative: 9 %
Neutro Abs: 3.9 10*3/uL (ref 1.7–7.7)
Neutrophils Relative %: 71 %
Platelets: 485 10*3/uL — ABNORMAL HIGH (ref 150–400)
RBC: 4.42 MIL/uL (ref 3.87–5.11)
RDW: 17.4 % — ABNORMAL HIGH (ref 11.5–15.5)
WBC: 5.4 10*3/uL (ref 4.0–10.5)
nRBC: 1.9 % — ABNORMAL HIGH (ref 0.0–0.2)

## 2022-06-30 LAB — COMPREHENSIVE METABOLIC PANEL
ALT: 13 U/L (ref 0–44)
AST: 24 U/L (ref 15–41)
Albumin: 3.5 g/dL (ref 3.5–5.0)
Alkaline Phosphatase: 94 U/L (ref 38–126)
Anion gap: 10 (ref 5–15)
BUN: 17 mg/dL (ref 8–23)
CO2: 26 mmol/L (ref 22–32)
Calcium: 9.3 mg/dL (ref 8.9–10.3)
Chloride: 101 mmol/L (ref 98–111)
Creatinine, Ser: 0.74 mg/dL (ref 0.44–1.00)
GFR, Estimated: >60 mL/min (ref >60)
Glucose, Bld: 159 mg/dL — ABNORMAL HIGH (ref 70–99)
Potassium: 2.7 mmol/L — CL (ref 3.5–5.1)
Sodium: 137 mmol/L (ref 135–145)
Total Bilirubin: 0.9 mg/dL (ref 0.3–1.2)
Total Protein: 6.2 g/dL — ABNORMAL LOW (ref 6.5–8.1)

## 2022-06-30 LAB — CBC
HCT: 32.3 % — ABNORMAL LOW (ref 36.0–46.0)
Hemoglobin: 9.6 g/dL — ABNORMAL LOW (ref 12.0–15.0)
MCH: 24.2 pg — ABNORMAL LOW (ref 26.0–34.0)
MCHC: 29.7 g/dL — ABNORMAL LOW (ref 30.0–36.0)
MCV: 81.4 fL (ref 80.0–100.0)
Platelets: 356 10*3/uL (ref 150–400)
RBC: 3.97 MIL/uL (ref 3.87–5.11)
RDW: 17.4 % — ABNORMAL HIGH (ref 11.5–15.5)
WBC: 5.6 10*3/uL (ref 4.0–10.5)
nRBC: 1.6 % — ABNORMAL HIGH (ref 0.0–0.2)

## 2022-06-30 LAB — MAGNESIUM: Magnesium: 2.1 mg/dL (ref 1.7–2.4)

## 2022-06-30 LAB — D-DIMER, QUANTITATIVE: D-Dimer, Quant: 0.93 ug/mL-FEU — ABNORMAL HIGH (ref 0.00–0.50)

## 2022-06-30 MED ORDER — LEVOTHYROXINE SODIUM 25 MCG PO TABS
25.0000 ug | ORAL_TABLET | Freq: Every day | ORAL | Status: DC
  Administered 2022-07-01: 25 ug via ORAL
  Filled 2022-06-30: qty 1

## 2022-06-30 MED ORDER — CARVEDILOL 3.125 MG PO TABS
3.1250 mg | ORAL_TABLET | Freq: Two times a day (BID) | ORAL | Status: DC
  Administered 2022-06-30 – 2022-07-01 (×2): 3.125 mg via ORAL
  Filled 2022-06-30 (×2): qty 1

## 2022-06-30 MED ORDER — ENSURE ENLIVE PO LIQD: 237.0000 mL | Freq: Two times a day (BID) | ORAL | Status: DC

## 2022-06-30 MED ORDER — CITALOPRAM HYDROBROMIDE 20 MG PO TABS
10.0000 mg | ORAL_TABLET | Freq: Every day | ORAL | Status: DC
  Administered 2022-07-01: 10 mg via ORAL
  Filled 2022-06-30: qty 1

## 2022-06-30 MED ORDER — ZOLPIDEM TARTRATE 5 MG PO TABS
5.0000 mg | ORAL_TABLET | Freq: Every evening | ORAL | Status: DC | PRN
  Administered 2022-06-30: 5 mg via ORAL
  Filled 2022-06-30: qty 1

## 2022-06-30 MED ORDER — POTASSIUM CHLORIDE CRYS ER 20 MEQ PO TBCR
40.0000 meq | EXTENDED_RELEASE_TABLET | Freq: Once | ORAL | Status: AC
  Administered 2022-06-30: 40 meq via ORAL
  Filled 2022-06-30: qty 2

## 2022-06-30 MED ORDER — VITAMIN D 25 MCG (1000 UNIT) PO TABS
1000.0000 [IU] | ORAL_TABLET | Freq: Every day | ORAL | Status: DC
  Administered 2022-07-01: 1000 [IU] via ORAL
  Filled 2022-06-30: qty 1

## 2022-06-30 MED ORDER — MECLIZINE HCL 25 MG PO TABS: 12.5000 mg | ORAL_TABLET | Freq: Four times a day (QID) | ORAL | Status: DC | PRN

## 2022-06-30 MED ORDER — ONDANSETRON HCL 4 MG/2ML IJ SOLN: 4.0000 mg | Freq: Four times a day (QID) | INTRAMUSCULAR | Status: DC | PRN

## 2022-06-30 MED ORDER — ATORVASTATIN CALCIUM 20 MG PO TABS
20.0000 mg | ORAL_TABLET | Freq: Every day | ORAL | Status: DC
  Administered 2022-06-30: 20 mg via ORAL
  Filled 2022-06-30: qty 1

## 2022-06-30 MED ORDER — HYDROCODONE-ACETAMINOPHEN 5-325 MG PO TABS: 1.0000 | ORAL_TABLET | ORAL | Status: DC | PRN

## 2022-06-30 MED ORDER — SUCRALFATE 1 GM/10ML PO SUSP
1.0000 g | Freq: Three times a day (TID) | ORAL | Status: DC
  Administered 2022-07-01: 1 g via ORAL
  Filled 2022-06-30 (×2): qty 10

## 2022-06-30 MED ORDER — ONDANSETRON HCL 4 MG PO TABS: 4.0000 mg | ORAL_TABLET | Freq: Four times a day (QID) | ORAL | Status: DC | PRN

## 2022-06-30 MED ORDER — LIDOCAINE-PRILOCAINE 2.5-2.5 % EX CREA: TOPICAL_CREAM | Freq: Once | CUTANEOUS | Status: AC

## 2022-06-30 MED ORDER — ACETAMINOPHEN 650 MG RE SUPP: 650.0000 mg | Freq: Four times a day (QID) | RECTAL | Status: DC | PRN

## 2022-06-30 MED ORDER — APIXABAN 2.5 MG PO TABS
2.5000 mg | ORAL_TABLET | Freq: Two times a day (BID) | ORAL | Status: DC
  Administered 2022-06-30 – 2022-07-01 (×2): 2.5 mg via ORAL
  Filled 2022-06-30 (×3): qty 1

## 2022-06-30 MED ORDER — LIDOCAINE 5 % EX PTCH: 1.0000 | MEDICATED_PATCH | Freq: Every day | CUTANEOUS | Status: DC | PRN

## 2022-06-30 MED ORDER — ACETAMINOPHEN 325 MG PO TABS: 650.0000 mg | ORAL_TABLET | Freq: Four times a day (QID) | ORAL | Status: DC | PRN

## 2022-06-30 MED ORDER — POLYETHYLENE GLYCOL 3350 17 G PO PACK
17.0000 g | PACK | Freq: Every day | ORAL | Status: DC
  Filled 2022-06-30: qty 1

## 2022-06-30 MED ORDER — PANTOPRAZOLE SODIUM 40 MG PO TBEC
40.0000 mg | DELAYED_RELEASE_TABLET | Freq: Every day | ORAL | Status: DC
  Administered 2022-07-01: 40 mg via ORAL
  Filled 2022-06-30: qty 1

## 2022-06-30 NOTE — H&P (Addendum)
HISTORY AND PHYSICAL    Sarah [name]   KGM:[id number] DOB: December 15, 1932   Date of Service: 06/30/22 Requesting physician/APP from ED: Treatment Team:  Attending Provider: Lucillie Garfinkel, MD  PCP: [facility]     HPI: Past medical history of December 2023 fall with splenic laceration status post operative repair, DVT on Eliquis, prior breast cancer, hypothyroid, hyperlipidemia hypertension, presents to the emergency department with left arm swelling. Daughter also notes some increased confusion over the past month or so, [place] was going to get a UA but patient did not want a in-and-out catheter for this. Pt taking Eliquis 2.5 mg bid. Has hx hemorrhoids and occasional blood in stool since being on anticoagulation. In the ED had bloody BM, daughter reports she has been constipated lately and straining. Patient is somewhat confused asking about a surgery she is supposed to have, but daughter states she is about at her baseline.    Pertinent  ED findings:  [place] (+) nonocclusive L axillary DVT low K at 2.7, supplementation provided.  Bloody BM as above and on AC, Hgb 10.6   Consultants:  none  Procedures: none      ASSESSMENT & PLAN:   Nonocclusive L axillary vein DVT On anticoagulation, hx DVT/PE with cancer and surgery history  Hemorrhoidal bleed on anticoagulation Hypokalemia likely due to loop diuretic  Recheck CBC and BMP tomorrow to follow anemia and hypokalemia. Monitor for bleeding Anticipate discharge tomorrow if stable and no additional bleeding  Potassium supplementation, consider long term supplementation while on Lasix Continue Eliquis 2.5 mg po bid    DVT prophylaxis: n/a Pertinent IV fluids/nutrition: none Central lines / invasive devices: none  Code Status: DNR confirmed w/ patient and daughter Family Communication: spoke w/ daughter at bedside  Disposition: observation TOC needs: none Barriers to discharge / significant pending  items: repeat labs in AM                   Review of Systems:  Constitutional:  Negative for chills, diaphoresis, fever and weight loss.  Eyes:  Negative for blurred vision.  Respiratory:  Negative for cough, hemoptysis and shortness of breath.   Cardiovascular:  Negative for chest pain, palpitations, orthopnea, claudication and leg swelling.  Gastrointestinal:  Positive for blood in stool and constipation. Negative for abdominal pain, diarrhea, nausea and vomiting.  Genitourinary:  Negative for dysuria, frequency, hematuria and urgency.  Musculoskeletal:  Negative for joint pain and myalgias.       LUE swelling per daughter  Skin:  Negative for rash.  Neurological:  Negative for dizziness, tremors, focal weakness and loss of consciousness.  Psychiatric/Behavioral:  Negative for depression.     has a past medical history of Allergy, Anxiety, Arrhythmia, Arthritis, Atrioventricular block, Mobitz type 1, Wenckebach, Breast cancer ([place]) (03/14/2012), Cancer ([place]), Cataract, DVT (deep venous thrombosis) ([place]) (02/2019), Dyspnea, Edema, Glaucoma, HOH (hard of hearing), Hyperlipemia, Hypertension, Hypothyroidism, Pancreatic cyst, PONV (postoperative nausea and vomiting), Pulmonary embolism ([place]) (02/2019), Radiation (06/11/2012-07/12/2012), Vertigo, Wears glasses, and Wears partial dentures.  No current facility-administered medications on file prior to encounter.   Current Outpatient Medications on File Prior to Encounter  Medication Sig Dispense Refill   acetaminophen (TYLENOL) 500 MG tablet Take 1,000 mg by mouth every 6 (six) hours as needed for moderate pain.     apixaban (ELIQUIS) 5 MG TABS tablet Take 1 tablet (5 mg total) by mouth 2 (two) times daily. 60 tablet    atorvastatin (LIPITOR) 20 MG tablet Take  1 tablet (20 mg total) by mouth daily. (Patient taking differently: Take 20 mg by mouth every evening.) 30 tablet 3   carvedilol (COREG) 3.125 MG tablet Take 3.125 mg by  mouth 2 (two) times daily with a meal.     cholecalciferol (VITAMIN D3) 25 MCG (1000 UNIT) tablet Take 1,000 Units by mouth daily.     citalopram (CELEXA) 10 MG tablet Take 10 mg by mouth daily.     feeding supplement (ENSURE ENLIVE / ENSURE PLUS) LIQD Take 237 mLs by mouth 2 (two) times daily between meals. 237 mL 12   furosemide (LASIX) 20 MG tablet Take 20 mg by mouth daily.     levothyroxine (SYNTHROID) 25 MCG tablet Take 25 mcg by mouth daily before breakfast.      lidocaine (LIDODERM) 5 % Place 1 patch onto the skin daily. Remove & Discard patch within 12 hours or as directed by MD 30 patch 0   lidocaine-prilocaine (EMLA) cream Apply to affected area once (Patient taking differently: Apply 1 application  topically See admin instructions. Apply to port site prior to port access) 30 g 3   meclizine (ANTIVERT) 12.5 MG tablet Take 12.5 mg by mouth every 6 (six) hours as needed for dizziness.     omeprazole (PRILOSEC) 20 MG capsule Take 1 capsule (20 mg total) by mouth daily. 30 capsule 0   polyethylene glycol (MIRALAX / GLYCOLAX) 17 g packet Take 17 g by mouth daily.     sucralfate (CARAFATE) 1 GM/10ML suspension Take 1 g by mouth 4 (four) times daily -  with meals and at bedtime.     zolpidem (AMBIEN) 5 MG tablet Take 5 mg by mouth at bedtime.       Allergies  Allergen Reactions   Alendronate Sodium Other (See Comments)    UNK reaction   Amoxicillin-Pot Clavulanate Other (See Comments)    UNK reaction   Codeine Other (See Comments)    Reaction not recalled   Lisinopril Other (See Comments)    UNK reaction   Septra [Sulfamethoxazole-Trimethoprim] Other (See Comments)   Thiazide-Type Diuretics Other (See Comments)    Blurry vision?? (patient stated she has macular degeneration)   Latex Rash      family history includes Cancer in her maternal aunt; Heart disease in her father. Past Surgical History:  Procedure Laterality Date   ABDOMINAL HYSTERECTOMY  1966   1/2 ovary left in     [place]   lumpectomy-lt   CATARACT EXTRACTION     b/l   COLONOSCOPY     EXCISION MASS NECK Left 03/17/2019    EXCISION MASS NECK (Left Neck)   EXCISION MASS NECK Left 03/17/2019   Procedure: EXCISION MASS NECK;  Surgeon: Helayne Seminole, MD;  Location: [place];  Service: ENT;  Laterality: Left;   EYE SURGERY Bilateral    bilateral cataract removal   IR IMAGING GUIDED PORT INSERTION  04/23/2019   JOINT REPLACEMENT  2013   rt total knee   JOINT REPLACEMENT  1995   lt total knee   LAPAROTOMY N/A 04/23/2022   Procedure: EXPLORATORY LAPAROTOMY, SPLENECTOMY;  Surgeon: Coralie Keens, MD;  Location: [place];  Service: General;  Laterality: N/A;   PACEMAKER IMPLANT N/A 11/01/2021   Procedure: PACEMAKER IMPLANT;  Surgeon: Vickie Epley, MD;  Location: [place] [facility];  Service: Cardiovascular;  Laterality: N/A;   PARTIAL MASTECTOMY WITH NEEDLE LOCALIZATION  04/09/2012   Procedure: PARTIAL MASTECTOMY WITH NEEDLE LOCALIZATION;  Surgeon: Renelda Loma  Alyssa Grove, MD;  Location: [place];  Service: General;  Laterality: Right;   SCALP LACERATION REPAIR N/A 04/23/2022   Procedure: SCALP LACERATION REPAIR;  Surgeon: Coralie Keens, MD;  Location: [place];  Service: General;  Laterality: N/A;   SKIN BIOPSY Left 03/17/2019   LEFT THIGH   SKIN BIOPSY Left 03/17/2019   Procedure: Skin Biopsy Left Thigh;  Surgeon: Helayne Seminole, MD;  Location[facility];  Service: ENT;  Laterality: Left;   TONSILLECTOMY     TOTAL KNEE ARTHROPLASTY  08/16/2011   Procedure: TOTAL KNEE ARTHROPLASTY;  Surgeon: Ninetta Lights, MD;  Location: [place];  Service: Orthopedics;  Laterality: Right;          Objective Findings:  Vitals:   06/30/22 1415 06/30/22 1416  BP: 136/76   Pulse: 70   Resp: 18   Temp: (!) 97.5 F (36.4 C)   TempSrc: Oral   SpO2: 95%   Weight:  47.6 kg  Height:  5\' 4"  (1.626 m)   No intake or output data in the 24 hours ending 06/30/22 1809 Filed Weights    06/30/22 1416  Weight: 47.6 kg    Examination:  Constitutional:      Appearance: Normal appearance.     Comments: Thin but not cachectic  HENT:     Head: Normocephalic and atraumatic.  Eyes:     Extraocular Movements: Extraocular movements intact.  Cardiovascular:     Rate and Rhythm: Normal rate and regular rhythm.     Heart sounds: Normal heart sounds.  Pulmonary:     Effort: Pulmonary effort is normal. No respiratory distress.     Breath sounds: Normal breath sounds.  Abdominal:     General: Abdomen is flat. Bowel sounds are normal.     Palpations: Abdomen is soft.  Musculoskeletal:        General: Swelling (very very slightly swollen appearance to LUE compared to RUE) present. No tenderness.     Cervical back: Neck supple.     Right lower leg: No edema.     Left lower leg: No edema.  Skin:    General: Skin is warm and dry.  Neurological:     General: No focal deficit present.     Mental Status: She is alert. Mental status is at baseline. She is disoriented.  Psychiatric:        Mood and Affect: Mood normal.        Behavior: Behavior normal.      Scheduled Medications:    Continuous Infusions:   PRN Medications:    Antimicrobials:  Anti-infectives (From admission, onward)    None           Data Reviewed: I have personally reviewed following labs and imaging studies  CBC: Recent Labs  Lab 06/30/22 1427  WBC 5.4  NEUTROABS 3.9  HGB 10.6*  HCT 36.5  MCV 82.6  PLT 937*   Basic Metabolic Panel: Recent Labs  Lab 06/30/22 1427 06/30/22 1434  NA 137  --   K 2.7*  --   CL 101  --   CO2 26  --   GLUCOSE 159*  --   BUN 17  --   CREATININE 0.74  --   CALCIUM 9.3  --   MG  --  2.1   GFR: Estimated Creatinine Clearance: 37.2 mL/min (by C-G formula based on SCr of 0.74 mg/dL). Liver Function Tests: Recent Labs  Lab 06/30/22 1427  AST 24  ALT 13  ALKPHOS 94  BILITOT 0.9  PROT 6.2*  ALBUMIN 3.5   No results for input(s):  "LIPASE", "AMYLASE" in the last 168 hours. No results for input(s): "AMMONIA" in the last 168 hours. Coagulation Profile: No results for input(s): "INR", "PROTIME" in the last 168 hours. Cardiac Enzymes: No results for input(s): "CKTOTAL", "CKMB", "CKMBINDEX", "TROPONINI" in the last 168 hours. BNP (last 3 results) No results for input(s): "PROBNP" in the last 8760 hours. HbA1C: No results for input(s): "HGBA1C" in the last 72 hours. CBG: No results for input(s): "GLUCAP" in the last 168 hours. Lipid Profile: No results for input(s): "CHOL", "HDL", "LDLCALC", "TRIG", "CHOLHDL", "LDLDIRECT" in the last 72 hours. Thyroid Function Tests: No results for input(s): "TSH", "T4TOTAL", "FREET4", "T3FREE", "THYROIDAB" in the last 72 hours. Anemia Panel: No results for input(s): "VITAMINB12", "FOLATE", "FERRITIN", "TIBC", "IRON", "RETICCTPCT" in the last 72 hours. Most Recent Urinalysis On File:     Component Value Date/Time   COLORURINE YELLOW 05/02/2022 1450   APPEARANCEUR CLOUDY (A) 05/02/2022 1450   LABSPEC 1.012 05/02/2022 1450   PHURINE 8.0 05/02/2022 1450   GLUCOSEU NEGATIVE 05/02/2022 1450   HGBUR NEGATIVE 05/02/2022 1450   BILIRUBINUR NEGATIVE 05/02/2022 1450   KETONESUR NEGATIVE 05/02/2022 1450   PROTEINUR NEGATIVE 05/02/2022 1450   UROBILINOGEN 0.2 04/09/2012 1202   NITRITE NEGATIVE 05/02/2022 1450   LEUKOCYTESUR TRACE (A) 05/02/2022 1450   Sepsis Labs: @LABRCNTIP (procalcitonin:4,lacticidven:4)  No results found for this or any previous visit (from the past 240 hour(s)).       Radiology Studies: DG Elbow 2 Views Left  Result Date: 06/30/2022 CLINICAL DATA:  Swelling EXAM: LEFT ELBOW - 2 VIEW COMPARISON:  None Available. FINDINGS: There is no evidence of fracture, dislocation, or joint effusion. There is no evidence of arthropathy or other focal bone abnormality. There is soft tissue swelling surrounding the elbow. Peripheral vascular calcifications are present.  IMPRESSION: Soft tissue swelling surrounding the elbow. No acute fracture or dislocation. Electronically Signed   By: Ronney Asters M.D.   On: 06/30/2022 16:37   DG Wrist Complete Left  Result Date: 06/30/2022 CLINICAL DATA:  LEFT arm swelling, history of blood clot LEFT arm in December 2023 EXAM: LEFT WRIST - COMPLETE 3+ VIEW COMPARISON:  LEFT hand radiographs 04/24/2022 FINDINGS: Osseous demineralization. Chondrocalcinosis at TFCC. Narrowing of first CMC joint, STT joint and midcarpal joint. Marked widening of scapholunate interval consistent with torn scapholunate ligament. No acute fracture, dislocation, or bone destruction. IMPRESSION: Osseous demineralization with scattered degenerative changes, changes of CPPD, and chronic scapholunate ligament tear. No acute abnormalities. Electronically Signed   By: Lavonia Dana M.D.   On: 06/30/2022 16:36   US Venous Img Upper Uni Left  Result Date: 06/30/2022 CLINICAL DATA:  Swelling EXAM: LEFT UPPER EXTREMITY VENOUS DOPPLER ULTRASOUND TECHNIQUE: Gray-scale sonography with graded compression, as well as color Doppler and duplex ultrasound were performed to evaluate the upper extremity deep venous system from the level of the subclavian vein and including the jugular, axillary, basilic, radial, ulnar and upper cephalic vein. Spectral Doppler was utilized to evaluate flow at rest and with distal augmentation maneuvers. COMPARISON:  None Available. FINDINGS: Contralateral Subclavian Vein: Respiratory phasicity is normal and symmetric with the symptomatic side. No evidence of thrombus. Normal compressibility. Internal Jugular Vein: No evidence of thrombus. Normal compressibility, respiratory phasicity and response to augmentation. Subclavian Vein: No evidence of thrombus. Normal compressibility, respiratory phasicity and response to augmentation. Axillary Vein: There is eccentric hypoechoic mural thrombus in the left axillary vein resulting in  incomplete compressibility.  Some persistent color flow signal is noted on color and Doppler. Cephalic Vein: No evidence of thrombus. Normal compressibility, respiratory phasicity and response to augmentation. Basilic Vein: There is eccentric hypoechoic mural thrombus resulting in incomplete compressibility. Some persistent color flow signal is noted on color and Doppler through the segment. Brachial Veins: No evidence of thrombus. Normal compressibility, respiratory phasicity and response to augmentation. Radial Veins: No evidence of thrombus. Normal compressibility, respiratory phasicity and response to augmentation. Ulnar Veins: No evidence of thrombus. Normal compressibility, respiratory phasicity and response to augmentation. Venous Reflux:  None visualized. Other Findings:  None visualized. IMPRESSION: 1. POSITIVE for nonocclusive DVT involving axillary vein. 2. Nonocclusive superficial thrombophlebitis of left basilic vein. Electronically Signed   By: Lucrezia Europe M.D.   On: 06/30/2022 16:07             LOS: 0 days    Time spent: 50 min     Emeterio Reeve, DO [facility] 06/30/2022, 6:09 PM    Dictation software may have been used to generate the above note. Typos may occur and escape review in typed/dictated notes. Please contact Dr Sheppard Coil directly for clarity if needed.  Staff may message me via secure chat in [place]  but this may not receive an immediate response,  please page me for urgent matters!  If 7PM-7AM, please contact night coverage [email or web address]

## 2022-06-30 NOTE — Consult Note (Deleted)
CONSULTATION    Sarah [name]   BOF:[id number] DOB: [birth year]   Date of Service: 06/30/22 Requesting physician/APP from ED: Treatment Team:  Attending Provider: Lucillie Garfinkel, MD PCP: [facility]     HPI: Past medical history of December 2023 fall with splenic laceration status post operative repair, DVT on Eliquis, prior breast cancer, hypothyroid, hyperlipidemia hypertension, presents to the emergency department with left arm swelling. Daughter also notes some increased confusion over the past month or so, [facility] was going to get a UA but patient did not want a in-and-out catheter for this. Pt taking Eliquis 2.5 mg bid. Has hx hemorrhoids and occasional blood in stool since being on anticoagulation. In the ED had bloody BM, daughter reports she has been constipated lately and straining. Patient is somewhat confused asking about a surgery she is supposed to have, but daughter states she is about at her baseline.   Pertinent  ED findings:  [place] (+) nonocclusive L axillary DVT low K at 2.7, supplementation provided.  Bloody BM as above and on AC, Hgb 10.6       ASSESSMENT & PLAN:   Nonocclusive L axillary vein DVT On anticoagulation, hx DVT/PE with cancer and surgery history  Hemorrhoidal bleed on anticoagulation Hypokalemia likely due to loop diuretic   Daughter is decision-maker as patient does not seem to understand her situation. Daughter asks about going back to [facility] to keep an eye on things there rather than stay overnight for observation. Extensive discussion of risks/benefits of either staying in hospital versus discharging with close follow up and clear return precautions. Daughter opts for discharge.     Recommendations Discharge back to facility Recheck CBC and BMP in next 1-2 days to follow anemia and hypokalemia Potassium supplementation 20 mEq daily while on Lasix Continue Eliquis 2.5 mg po bid Repeat ultrasound of LUE in 1-2 weeks or  sooner if worsening swelling  if clot is extending but not occluding would consider increase Eliquis to 5 mg po bid  If concern for occlusion or severe edema and/or pain, would return to ED for further evaluation  Monitor for bleeding and monitor CBC as above. If severe bleeding or Hgb drop <8, return to ED for further evaluation.  Consider urinalysis to collect at ED or at [facility], no symptoms of UTI and not meeting sepsis criteria so would be asymptomatic bacteriuria anyway at this point but if increased confusion or other concerns for infection would certainly recommend evaluation in ED or by PCP.   Plan was discussed with daughter and all questions answered.                 Review of Systems:  Review of Systems  Constitutional:  Negative for chills, diaphoresis, fever and weight loss.  Eyes:  Negative for blurred vision.  Respiratory:  Negative for cough, hemoptysis and shortness of breath.   Cardiovascular:  Negative for chest pain, palpitations, orthopnea, claudication and leg swelling.  Gastrointestinal:  Positive for blood in stool and constipation. Negative for abdominal pain, diarrhea, nausea and vomiting.  Genitourinary:  Negative for dysuria, frequency, hematuria and urgency.  Musculoskeletal:  Negative for joint pain and myalgias.       LUE swelling per daughter  Skin:  Negative for rash.  Neurological:  Negative for dizziness, tremors, focal weakness and loss of consciousness.  Psychiatric/Behavioral:  Negative for depression.        has a past medical history of Allergy, Anxiety, Arrhythmia, Arthritis, Atrioventricular block, Mobitz type  1, Wenckebach, Breast cancer ([place]) (03/14/2012), Cancer ([place]), Cataract, DVT (deep venous thrombosis) ([place]) (02/2019), Dyspnea, Edema, Glaucoma, HOH (hard of hearing), Hyperlipemia, Hypertension, Hypothyroidism, Pancreatic cyst, PONV (postoperative nausea and vomiting), Pulmonary embolism ([place]) (02/2019), Radiation  (06/11/2012-07/12/2012), Vertigo, Wears glasses, and Wears partial dentures.  No current facility-administered medications on file prior to encounter.   Current Outpatient Medications on File Prior to Encounter  Medication Sig Dispense Refill   acetaminophen (TYLENOL) 500 MG tablet Take 1,000 mg by mouth every 6 (six) hours as needed for moderate pain.     apixaban (ELIQUIS) 5 MG TABS tablet Take 1 tablet (5 mg total) by mouth 2 (two) times daily. 60 tablet    atorvastatin (LIPITOR) 20 MG tablet Take 1 tablet (20 mg total) by mouth daily. (Patient taking differently: Take 20 mg by mouth every evening.) 30 tablet 3   carvedilol (COREG) 3.125 MG tablet Take 3.125 mg by mouth 2 (two) times daily with a meal.     cholecalciferol (VITAMIN D3) 25 MCG (1000 UNIT) tablet Take 1,000 Units by mouth daily.     citalopram (CELEXA) 10 MG tablet Take 10 mg by mouth daily.     feeding supplement (ENSURE ENLIVE / ENSURE PLUS) LIQD Take 237 mLs by mouth 2 (two) times daily between meals. 237 mL 12   furosemide (LASIX) 20 MG tablet Take 20 mg by mouth daily.     levothyroxine (SYNTHROID) 25 MCG tablet Take 25 mcg by mouth daily before breakfast.      lidocaine (LIDODERM) 5 % Place 1 patch onto the skin daily. Remove & Discard patch within 12 hours or as directed by MD 30 patch 0   lidocaine-prilocaine (EMLA) cream Apply to affected area once (Patient taking differently: Apply 1 application  topically See admin instructions. Apply to port site prior to port access) 30 g 3   meclizine (ANTIVERT) 12.5 MG tablet Take 12.5 mg by mouth every 6 (six) hours as needed for dizziness.     omeprazole (PRILOSEC) 20 MG capsule Take 1 capsule (20 mg total) by mouth daily. 30 capsule 0   polyethylene glycol (MIRALAX / GLYCOLAX) 17 g packet Take 17 g by mouth daily.     sucralfate (CARAFATE) 1 GM/10ML suspension Take 1 g by mouth 4 (four) times daily -  with meals and at bedtime.     zolpidem (AMBIEN) 5 MG tablet Take 5 mg by  mouth at bedtime.       Allergies  Allergen Reactions   Alendronate Sodium Other (See Comments)    UNK reaction   Amoxicillin-Pot Clavulanate Other (See Comments)    UNK reaction   Codeine Other (See Comments)    Reaction not recalled   Lisinopril Other (See Comments)    UNK reaction   Septra [Sulfamethoxazole-Trimethoprim] Other (See Comments)   Thiazide-Type Diuretics Other (See Comments)    Blurry vision?? (patient stated she has macular degeneration)   Latex Rash      family history includes Cancer in her maternal aunt; Heart disease in her father.  Past Surgical History:  Procedure Laterality Date   ABDOMINAL HYSTERECTOMY  1966   1/2 ovary left in    [place]   lumpectomy-lt   CATARACT EXTRACTION     b/l   COLONOSCOPY     EXCISION MASS NECK Left 03/17/2019    EXCISION MASS NECK (Left Neck)   EXCISION MASS NECK Left 03/17/2019   Procedure: EXCISION MASS NECK;  Surgeon: Helayne Seminole, MD;  Location:  [facility];  Service: ENT;  Laterality: Left;   EYE SURGERY Bilateral    bilateral cataract removal   IR IMAGING GUIDED PORT INSERTION  04/23/2019   JOINT REPLACEMENT  2013   rt total knee   JOINT REPLACEMENT  1995   lt total knee   LAPAROTOMY N/A 04/23/2022   Procedure: EXPLORATORY LAPAROTOMY, SPLENECTOMY;  Surgeon: Coralie Keens, MD;  Location: [facility];  Service: General;  Laterality: N/A;   PACEMAKER IMPLANT N/A 11/01/2021   Procedure: PACEMAKER IMPLANT;  Surgeon: Vickie Epley, MD;  Location: [facility];  Service: Cardiovascular;  Laterality: N/A;   PARTIAL MASTECTOMY WITH NEEDLE LOCALIZATION  04/09/2012   Procedure: PARTIAL MASTECTOMY WITH NEEDLE LOCALIZATION;  Surgeon: Adin Hector, MD;  Location: [place];  Service: General;  Laterality: Right;   SCALP LACERATION REPAIR N/A 04/23/2022   Procedure: SCALP LACERATION REPAIR;  Surgeon: Coralie Keens, MD;  Location: [place];  Service: General;  Laterality: N/A;   SKIN  BIOPSY Left 03/17/2019   LEFT THIGH   SKIN BIOPSY Left 03/17/2019   Procedure: Skin Biopsy Left Thigh;  Surgeon: Helayne Seminole, MD;  Location: [place];  Service: ENT;  Laterality: Left;   TONSILLECTOMY     TOTAL KNEE ARTHROPLASTY  08/16/2011   Procedure: TOTAL KNEE ARTHROPLASTY;  Surgeon: Ninetta Lights, MD;  Location: [facility];  Service: Orthopedics;  Laterality: Right;          Objective Findings:  Vitals:   06/30/22 1415 06/30/22 1416  BP: 136/76   Pulse: 70   Resp: 18   Temp: (!) 97.5 F (36.4 C)   TempSrc: Oral   SpO2: 95%   Weight:  47.6 kg  Height:  5\' 4"  (1.626 m)   No intake or output data in the 24 hours ending 06/30/22 1750 Filed Weights   06/30/22 1416  Weight: 47.6 kg    Examination:  Physical Exam Constitutional:      Appearance: Normal appearance.     Comments: Thin but not cachectic  HENT:     Head: Normocephalic and atraumatic.  Eyes:     Extraocular Movements: Extraocular movements intact.  Cardiovascular:     Rate and Rhythm: Normal rate and regular rhythm.     Heart sounds: Normal heart sounds.  Pulmonary:     Effort: Pulmonary effort is normal. No respiratory distress.     Breath sounds: Normal breath sounds.  Abdominal:     General: Abdomen is flat. Bowel sounds are normal.     Palpations: Abdomen is soft.  Musculoskeletal:        General: Swelling (very very slightly swollen appearance to LUE compared to RUE) present. No tenderness.     Cervical back: Neck supple.     Right lower leg: No edema.     Left lower leg: No edema.  Skin:    General: Skin is warm and dry.  Neurological:     General: No focal deficit present.     Mental Status: She is alert. Mental status is at baseline. She is disoriented.  Psychiatric:        Mood and Affect: Mood normal.        Behavior: Behavior normal.            Data Reviewed: I have personally reviewed following labs and imaging studies  CBC: Recent Labs  Lab 06/30/22 1427  WBC  5.4  NEUTROABS 3.9  HGB 10.6*  HCT 36.5  MCV 82.6  PLT  354*   Basic Metabolic Panel: Recent Labs  Lab 06/30/22 1427 06/30/22 1434  NA 137  --   K 2.7*  --   CL 101  --   CO2 26  --   GLUCOSE 159*  --   BUN 17  --   CREATININE 0.74  --   CALCIUM 9.3  --   MG  --  2.1   GFR: Estimated Creatinine Clearance: 37.2 mL/min (by C-G formula based on SCr of 0.74 mg/dL). Liver Function Tests: Recent Labs  Lab 06/30/22 1427  AST 24  ALT 13  ALKPHOS 94  BILITOT 0.9  PROT 6.2*  ALBUMIN 3.5   No results for input(s): "LIPASE", "AMYLASE" in the last 168 hours. No results for input(s): "AMMONIA" in the last 168 hours. Coagulation Profile: No results for input(s): "INR", "PROTIME" in the last 168 hours. Cardiac Enzymes: No results for input(s): "CKTOTAL", "CKMB", "CKMBINDEX", "TROPONINI" in the last 168 hours. BNP (last 3 results) No results for input(s): "PROBNP" in the last 8760 hours. HbA1C: No results for input(s): "HGBA1C" in the last 72 hours. CBG: No results for input(s): "GLUCAP" in the last 168 hours. Lipid Profile: No results for input(s): "CHOL", "HDL", "LDLCALC", "TRIG", "CHOLHDL", "LDLDIRECT" in the last 72 hours. Thyroid Function Tests: No results for input(s): "TSH", "T4TOTAL", "FREET4", "T3FREE", "THYROIDAB" in the last 72 hours. Anemia Panel: No results for input(s): "VITAMINB12", "FOLATE", "FERRITIN", "TIBC", "IRON", "RETICCTPCT" in the last 72 hours. Most Recent Urinalysis On File:     Component Value Date/Time   COLORURINE YELLOW 05/02/2022 1450   APPEARANCEUR CLOUDY (A) 05/02/2022 1450   LABSPEC 1.012 05/02/2022 1450   PHURINE 8.0 05/02/2022 1450   GLUCOSEU NEGATIVE 05/02/2022 1450   HGBUR NEGATIVE 05/02/2022 1450   BILIRUBINUR NEGATIVE 05/02/2022 1450   KETONESUR NEGATIVE 05/02/2022 1450   PROTEINUR NEGATIVE 05/02/2022 1450   UROBILINOGEN 0.2 04/09/2012 1202   NITRITE NEGATIVE 05/02/2022 1450   LEUKOCYTESUR TRACE (A) 05/02/2022 1450   Sepsis  Labs: @LABRCNTIP (procalcitonin:4,lacticidven:4)  No results found for this or any previous visit (from the past 240 hour(s)).       Radiology Studies: DG Elbow 2 Views Left  Result Date: 06/30/2022 CLINICAL DATA:  Swelling EXAM: LEFT ELBOW - 2 VIEW COMPARISON:  None Available. FINDINGS: There is no evidence of fracture, dislocation, or joint effusion. There is no evidence of arthropathy or other focal bone abnormality. There is soft tissue swelling surrounding the elbow. Peripheral vascular calcifications are present. IMPRESSION: Soft tissue swelling surrounding the elbow. No acute fracture or dislocation. Electronically Signed   By: Ronney Asters M.D.   On: 06/30/2022 16:37   DG Wrist Complete Left  Result Date: 06/30/2022 CLINICAL DATA:  LEFT arm swelling, history of blood clot LEFT arm in December 2023 EXAM: LEFT WRIST - COMPLETE 3+ VIEW COMPARISON:  LEFT hand radiographs 04/24/2022 FINDINGS: Osseous demineralization. Chondrocalcinosis at TFCC. Narrowing of first CMC joint, STT joint and midcarpal joint. Marked widening of scapholunate interval consistent with torn scapholunate ligament. No acute fracture, dislocation, or bone destruction. IMPRESSION: Osseous demineralization with scattered degenerative changes, changes of CPPD, and chronic scapholunate ligament tear. No acute abnormalities. Electronically Signed   By: Lavonia Dana M.D.   On: 06/30/2022 16:36   US Venous Img Upper Uni Left  Result Date: 06/30/2022 CLINICAL DATA:  Swelling EXAM: LEFT UPPER EXTREMITY VENOUS DOPPLER ULTRASOUND TECHNIQUE: Gray-scale sonography with graded compression, as well as color Doppler and duplex ultrasound were performed to evaluate the upper extremity deep venous system from  the level of the subclavian vein and including the jugular, axillary, basilic, radial, ulnar and upper cephalic vein. Spectral Doppler was utilized to evaluate flow at rest and with distal augmentation maneuvers. COMPARISON:  None  Available. FINDINGS: Contralateral Subclavian Vein: Respiratory phasicity is normal and symmetric with the symptomatic side. No evidence of thrombus. Normal compressibility. Internal Jugular Vein: No evidence of thrombus. Normal compressibility, respiratory phasicity and response to augmentation. Subclavian Vein: No evidence of thrombus. Normal compressibility, respiratory phasicity and response to augmentation. Axillary Vein: There is eccentric hypoechoic mural thrombus in the left axillary vein resulting in incomplete compressibility. Some persistent color flow signal is noted on color and Doppler. Cephalic Vein: No evidence of thrombus. Normal compressibility, respiratory phasicity and response to augmentation. Basilic Vein: There is eccentric hypoechoic mural thrombus resulting in incomplete compressibility. Some persistent color flow signal is noted on color and Doppler through the segment. Brachial Veins: No evidence of thrombus. Normal compressibility, respiratory phasicity and response to augmentation. Radial Veins: No evidence of thrombus. Normal compressibility, respiratory phasicity and response to augmentation. Ulnar Veins: No evidence of thrombus. Normal compressibility, respiratory phasicity and response to augmentation. Venous Reflux:  None visualized. Other Findings:  None visualized. IMPRESSION: 1. POSITIVE for nonocclusive DVT involving axillary vein. 2. Nonocclusive superficial thrombophlebitis of left basilic vein. Electronically Signed   By: Lucrezia Europe M.D.   On: 06/30/2022 16:07             LOS: 0 days       Emeterio Reeve, DO [facility] 06/30/2022, 5:50 PM    Dictation software may have been used to generate the above note. Typos may occur and escape review in typed/dictated notes. Please contact Dr Sheppard Coil directly for clarity if needed.  Staff may message me via secure chat in [place]  but this may not receive an immediate response,  please page me for  urgent matters!  If 7PM-7AM, please contact night coverage [email or web address]

## 2022-06-30 NOTE — ED Triage Notes (Signed)
Pt presents tot the ED via POV due to L arm swelling. Pt's family member states she had a blood clot in her L arm in Dec 2023. Pt denies any pain. L arm swelling noted. No redness or warm to touch.

## 2022-06-30 NOTE — ED Provider Notes (Addendum)
[facility] Provider Note    Event Date/Time   First MD Initiated Contact with Patient 06/30/22 1439     (approximate)   History   Arm Swelling   HPI  Sarah [name] is a 89 y.o. female   Past medical history of December 2023 fall with splenic laceration status post operative repair, DVT on Eliquis, prior breast cancer, hypothyroid, hyperlipidemia hypertension, presents to the emergency department with recurrent left arm swelling.  She is at a rehab facility has been taking all medications as prescribed.  No other acute medical complaints.  No injuries or falls or trauma.  She denies chest pain or trouble breathing.  She has had poor p.o. intake and has been chronically weak since her discharge from the hospital for her fall and splenic laceration.  Independent Historian contributed to assessment above: Patient's daughter at bedside  External Medical Documents Reviewed: Reviewed from December 2023 for fall and splenic injury      Physical Exam   Triage Vital Signs: ED Triage Vitals  Enc Vitals Group     BP 06/30/22 1415 136/76     Pulse Rate 06/30/22 1415 70     Resp 06/30/22 1415 18     Temp 06/30/22 1415 (!) 97.5 F (36.4 C)     Temp Source 06/30/22 1415 Oral     SpO2 06/30/22 1415 95 %     Weight 06/30/22 1416 104 lb 15 oz (47.6 kg)     Height 06/30/22 1416 5\' 4"  (1.626 m)     Head Circumference --      [facility] Flow --      Pain Score 06/30/22 1416 0     Pain Loc --      Pain Edu? --      Excl. in [place]? --     Most recent vital signs: Vitals:   06/30/22 1415  BP: 136/76  Pulse: 70  Resp: 18  Temp: (!) 97.5 F (36.4 C)  SpO2: 95%    General: Awake, no distress.  CV:  Good peripheral perfusion.  Resp:  Normal effort.  Abd:  No distention.  Other:  Chronically ill-appearing, vital signs normal, she does have swelling to the left arm at the forearm but none above the elbow.  Neurovascular intact and active full range of motion with  no bony tenderness.  No overlying skin changes suggesting of infection.   ED Results / Procedures / Treatments   Labs (all labs ordered are listed, but only abnormal results are displayed) Labs Reviewed  CBC WITH DIFFERENTIAL/PLATELET - Abnormal; Notable for the following components:      Result Value   Hemoglobin 10.6 (*)    MCH 24.0 (*)    MCHC 29.0 (*)    RDW 17.4 (*)    Platelets 485 (*)    nRBC 1.9 (*)    Abs Immature Granulocytes 0.10 (*)    All other components within normal limits  COMPREHENSIVE METABOLIC PANEL - Abnormal; Notable for the following components:   Potassium 2.7 (*)    Glucose, Bld 159 (*)    Total Protein 6.2 (*)    All other components within normal limits  D-DIMER, QUANTITATIVE - Abnormal; Notable for the following components:   D-Dimer, Quant 0.93 (*)    All other components within normal limits  MAGNESIUM  URINALYSIS, ROUTINE W REFLEX MICROSCOPIC     I ordered and reviewed the above labs they are notable for potassium is low at 2.7.  Magnesium is normal.  Her H&H are at baseline.    EKG  ED ECG REPORT I, Lucillie Garfinkel, the attending physician, personally viewed and interpreted this ECG.   Date: 06/30/2022  EKG Time: 1701  Rate: 66  Rhythm: ?AF w rate 60s  Axis: LAD  Intervals: Nonspecific intraventricular conduction delay, consistent morphology with previous EKG in Jan 2023  ST&T Change: No acute ischemic changes    RADIOLOGY I independently reviewed and interpreted the elbow x-ray and see no fractures dislocations.   PROCEDURES:  Critical Care performed: No  Procedures   MEDICATIONS ORDERED IN ED: Medications  potassium chloride SA (KLOR-CON M) CR tablet 40 mEq (40 mEq Oral Given 06/30/22 1542)    External physician / consultants:  I spoke with this for admission and regarding care plan for this patient.   IMPRESSION / MDM / ASSESSMENT AND PLAN / ED COURSE  I reviewed the triage vital signs and the nursing notes.                                 Patient's presentation is most consistent with acute presentation with potential threat to life or bodily function.  Differential diagnosis includes, but is not limited to, recurrent DVT, fractures, dislocation, vascular insufficiency, infection, metabolic derangements   The patient is on the cardiac monitor to evaluate for evidence of arrhythmia and/or significant heart rate changes.  MDM: The patient with prior DVT on Eliquis at a reduced dose given GI bleeding complications.  She is been compliant with her medications but has recurrent left arm swelling.  Neurovascular intact and no signs of trauma no reports of trauma.  Will obtain x-ray and DVT ultrasound.  She has low potassium, given oral repletion.  Check EKG.  Ultrasound shows DVT nonocclusive axillary vein.  In the emergency department she had a large bloody bowel movement with blood clot.  Hemodynamics remain reassuring, and her H&H is normal. Hypokalemia 2.7, will review EKG.  Given multiple medical findings today of nonocclusive DVT despite being on Eliquis, rectal bleeding, as well as hypokalemia I think she should be admitted for further observation repeat H&H and decision regarding anticoagulation in the DVT but complicated by rectal bleeding, recheck electrolytes after repletion.  DNR DNI per daughter decision maker     FINAL CLINICAL IMPRESSION(S) / ED DIAGNOSES   Final diagnoses:  Rectal bleeding  Deep vein thrombosis (DVT) of axillary vein of left upper extremity, unspecified chronicity ([place])  Hypokalemia     Rx / DC Orders   ED Discharge Orders     None        Note:  This document was prepared using Dragon voice recognition software and may include unintentional dictation errors.    Lucillie Garfinkel, MD 06/30/22 1705    Lucillie Garfinkel, MD 06/30/22 [phone]

## 2022-07-01 ENCOUNTER — Other Ambulatory Visit: Payer: Self-pay

## 2022-07-01 DIAGNOSIS — E876 Hypokalemia: Secondary | ICD-10-CM

## 2022-07-01 DIAGNOSIS — D649 Anemia, unspecified: Secondary | ICD-10-CM | POA: Diagnosis not present

## 2022-07-01 DIAGNOSIS — K649 Unspecified hemorrhoids: Secondary | ICD-10-CM

## 2022-07-01 DIAGNOSIS — I82622 Acute embolism and thrombosis of deep veins of left upper extremity: Secondary | ICD-10-CM | POA: Diagnosis not present

## 2022-07-01 DIAGNOSIS — I82A22 Chronic embolism and thrombosis of left axillary vein: Secondary | ICD-10-CM

## 2022-07-01 LAB — BASIC METABOLIC PANEL
Anion gap: 3 — ABNORMAL LOW (ref 5–15)
BUN: 18 mg/dL (ref 8–23)
CO2: 29 mmol/L (ref 22–32)
Calcium: 9 mg/dL (ref 8.9–10.3)
Chloride: 109 mmol/L (ref 98–111)
Creatinine, Ser: 0.71 mg/dL (ref 0.44–1.00)
GFR, Estimated: >60 mL/min (ref >60)
Glucose, Bld: 100 mg/dL — ABNORMAL HIGH (ref 70–99)
Potassium: 3.3 mmol/L — ABNORMAL LOW (ref 3.5–5.1)
Sodium: 141 mmol/L (ref 135–145)

## 2022-07-01 LAB — URINALYSIS, ROUTINE W REFLEX MICROSCOPIC
Bacteria, UA: NONE SEEN
Bilirubin Urine: NEGATIVE
Glucose, UA: NEGATIVE mg/dL
Ketones, ur: NEGATIVE mg/dL
Nitrite: NEGATIVE
Protein, ur: NEGATIVE mg/dL
Specific Gravity, Urine: 1.011 (ref 1.005–1.030)
pH: 5 (ref 5.0–8.0)

## 2022-07-01 LAB — CBC
HCT: 30.4 % — ABNORMAL LOW (ref 36.0–46.0)
Hemoglobin: 9.3 g/dL — ABNORMAL LOW (ref 12.0–15.0)
MCH: 24 pg — ABNORMAL LOW (ref 26.0–34.0)
MCHC: 30.6 g/dL (ref 30.0–36.0)
MCV: 78.4 fL — ABNORMAL LOW (ref 80.0–100.0)
Platelets: 426 10*3/uL — ABNORMAL HIGH (ref 150–400)
RBC: 3.88 MIL/uL (ref 3.87–5.11)
RDW: 17.5 % — ABNORMAL HIGH (ref 11.5–15.5)
WBC: 5 10*3/uL (ref 4.0–10.5)
nRBC: 2.2 % — ABNORMAL HIGH (ref 0.0–0.2)

## 2022-07-01 MED ORDER — POTASSIUM CHLORIDE CRYS ER 20 MEQ PO TBCR: 20.0000 meq | EXTENDED_RELEASE_TABLET | Freq: Every day | ORAL | 3 refills | Status: DC

## 2022-07-01 MED ORDER — FUROSEMIDE 20 MG PO TABS: 30.0000 mg | ORAL_TABLET | Freq: Every day | ORAL | Status: DC

## 2022-07-01 MED ORDER — APIXABAN 2.5 MG PO TABS: 2.5000 mg | ORAL_TABLET | Freq: Two times a day (BID) | ORAL | Status: DC

## 2022-07-01 MED ORDER — HEPARIN SOD (PORK) LOCK FLUSH 100 UNIT/ML IV SOLN
500.0000 [IU] | Freq: Once | INTRAVENOUS | Status: AC
  Administered 2022-07-01: 500 [IU] via INTRAVENOUS
  Filled 2022-07-01: qty 5

## 2022-07-01 MED ORDER — POTASSIUM CHLORIDE CRYS ER 20 MEQ PO TBCR
40.0000 meq | EXTENDED_RELEASE_TABLET | Freq: Once | ORAL | Status: AC
  Administered 2022-07-01: 40 meq via ORAL
  Filled 2022-07-01: qty 2

## 2022-07-01 NOTE — Progress Notes (Addendum)
MD order received in [facility] to discharge pt back to SNF[facility] today; pt's daughter to transport the pt back to [facility] via personal wheelchair; verbally reviewed AVS with the pt and her daughter, Camelia Stelzner, gave written Rx for K-Dur to the pt and the yellow DNR form and placed it in a discharge packet; no questions voiced at this time; pt discharged via personal wheelchair by nursing to the [place] entrance

## 2022-07-01 NOTE — Plan of Care (Signed)

## 2022-07-01 NOTE — Discharge Summary (Signed)
Physician Discharge Summary   Patient: Sarah [name] MRN: [id number]  DOB: 07-04-1932   Admit:     Date of Admission: 06/30/2022 Admitted from: SNF   Discharge: Date of discharge: 07/01/22 Disposition: Skilled nursing facility Condition at discharge: good  CODE STATUS: DNR     Discharge Physician: Emeterio Reeve, DO [facility]     PCP: [facility]  Recommendations for Outpatient Follow-up:  Follow up with PCP [place], [place] in 1-2 weeks Please obtain labs/tests: as below Please follow up on the following pending results: none PCP AND OTHER OUTPATIENT PROVIDERS: SEE [place] AVS PATIENT INFO    Discharge Instructions     Diet - low sodium heart healthy   Complete by: As directed    Discharge instructions   Complete by: As directed    Follow CBC in 2-3 days to monitor anemia  Monitor for bleeding - known hemorrhoids and on anticoagulation but please sent to ED for severe bleeding Consider follow [place] LUE in 2 weeks Elevate LUE on pillows while in bed  Continue Eliquis 2.5 mg bid   New: potassium supplement.  New: uniform dose lasix daily Check BMP in 2-3 days to monitor potassium   Increase activity slowly   Complete by: As directed          Discharge Diagnoses: Principal Problem:   DVT (deep venous thrombosis) (HCC) Active Problems:   Hypokalemia   Hemorrhoids   Chronic anemia       Hospital Course: Past medical history of December 2023 fall with splenic laceration status post operative repair, DVT on Eliquis, prior breast cancer, hypothyroid, hyperlipidemia hypertension, presents to the emergency department with left arm swelling. Daughter also notes some increased confusion over the past month or so, [facility] was going to get a UA but patient did not want a in-and-out catheter for this. Pt taking Eliquis 2.5 mg bid. Has  hx hemorrhoids and occasional blood in stool since being on anticoagulation. In the ED had bloody BM, daughter reports she has been constipated lately and straining. Patient is somewhat confused asking about a surgery she is supposed to have, but daughter states she is about at her baseline.    Pertinent  ED findings:  [place] (+) nonocclusive L axillary DVT low K at 2.7, supplementation provided.  Bloody BM as above and on AC, Hgb 10.6  Today 07/01/22 d/w radiology - previous US LUE in 04/2022 extensive clot, improvement noted on recent scan yesterday (other [place] was under CV Procedures tab in Epic so was potentially missed when comparing). DVT is improving. CBC at baseline Hgb. No bleeding. K improved. Stable for d/c back to SNF w/ close monitoring      Consultants:  none   Procedures: none           ASSESSMENT & PLAN:   Nonocclusive L axillary vein DVT - improving from previous imaging based on radiology review  On anticoagulation, hx DVT/PE with cancer and surgery history - recent DVT w/ trauma requring her to be off anticoagulation in 04/2022 Hemorrhoidal bleed on anticoagulation Hypokalemia likely due to loop diuretic - improved     Follow CBC/BMP in 2-3 days Elevate LUE to help edema Lasix as below - same daily dose rather than alternating Potassium supplement to continue while on Lasix  Consider repeat LUE [place] in 2 weeks Continue Eliquis 2.5 mg since DVT appears to be  improving on this dose rather than worsening             Discharge Instructions  Allergies as of 07/01/2022       Reactions   Alendronate Sodium Other (See Comments)   UNK reaction   Amoxicillin-pot Clavulanate Other (See Comments)   UNK reaction   Codeine Other (See Comments)   Reaction not recalled   Lisinopril Other (See Comments)   UNK reaction   Septra [sulfamethoxazole-trimethoprim] Other (See Comments)   Thiazide-type Diuretics Other (See Comments)   Blurry vision?? (patient stated she  has macular degeneration)   Latex Rash        Medication List     TAKE these medications    acetaminophen 500 MG tablet Commonly known as: TYLENOL Take 1,000 mg by mouth every 6 (six) hours as needed for moderate pain.   apixaban 2.5 MG Tabs tablet Commonly known as: ELIQUIS Take 1 tablet (2.5 mg total) by mouth 2 (two) times daily. What changed:  medication strength how much to take when to take this   atorvastatin 20 MG tablet Commonly known as: LIPITOR Take 1 tablet (20 mg total) by mouth daily. What changed: when to take this   carvedilol 3.125 MG tablet Commonly known as: COREG Take 3.125 mg by mouth 2 (two) times daily with a meal.   cholecalciferol 25 MCG (1000 UNIT) tablet Commonly known as: VITAMIN D3 Take 1,000 Units by mouth daily.   citalopram 10 MG tablet Commonly known as: CELEXA Take 10 mg by mouth daily.   feeding supplement Liqd Take 237 mLs by mouth 2 (two) times daily between meals.   furosemide 20 MG tablet Commonly known as: LASIX Take 1.5 tablets (30 mg total) by mouth daily. What changed: how much to take   levothyroxine 25 MCG tablet Commonly known as: SYNTHROID Take 25 mcg by mouth daily before breakfast.   lidocaine 5 % Commonly known as: LIDODERM Place 1 patch onto the skin daily. Remove & Discard patch within 12 hours or as directed by MD   lidocaine-prilocaine cream Commonly known as: EMLA Apply to affected area once What changed:  how much to take how to take this when to take this additional instructions   meclizine 12.5 MG tablet Commonly known as: ANTIVERT Take 12.5 mg by mouth every 6 (six) hours as needed for dizziness.   omeprazole 20 MG capsule Commonly known as: PRILOSEC Take 1 capsule (20 mg total) by mouth daily.   polyethylene glycol 17 g packet Commonly known as: MIRALAX / GLYCOLAX Take 17 g by mouth daily.   potassium chloride SA 20 MEQ tablet Commonly known as: KLOR-CON M Take 1 tablet (20 mEq  total) by mouth daily.   sucralfate 1 GM/10ML suspension Commonly known as: CARAFATE Take 1 g by mouth 4 (four) times daily -  with meals and at bedtime.   zolpidem 5 MG tablet Commonly known as: AMBIEN Take 5 mg by mouth at bedtime.          Allergies  Allergen Reactions   Alendronate Sodium Other (See Comments)    UNK reaction   Amoxicillin-Pot Clavulanate Other (See Comments)    UNK reaction   Codeine Other (See Comments)    Reaction not recalled   Lisinopril Other (See Comments)    UNK reaction   Septra [Sulfamethoxazole-Trimethoprim] Other (See Comments)   Thiazide-Type Diuretics Other (See Comments)    Blurry vision?? (patient stated she has macular degeneration)   Latex Rash  Subjective: pt has no concerns, daughter nervous about swelling   Discharge Exam: BP (!) 170/98   Pulse 69   Temp 98.2 F (36.8 C) (Oral)   Resp 18   Ht 5\' 4"  (1.626 m)   Wt 47.6 kg   SpO2 96%   BMI 18.01 kg/m  General: Pt is alert, awake, not in acute distress Cardiovascular: RRR, S1/S2 +murmur, no rubs, no gallops Respiratory: CTA bilaterally, no wheezing, no rhonchi Abdominal: Soft, NT, ND, bowel sounds + Extremities: trace nonpitting edema in LUE otherwise no edema, LUE nontender, no cyanosis     The results of significant diagnostics from this hospitalization (including imaging, microbiology, ancillary and laboratory) are listed below for reference.     Microbiology: No results found for this or any previous visit (from the past 240 hour(s)).   Labs: BNP (last 3 results) No results for input(s): "BNP" in the last 8760 hours. Basic Metabolic Panel: Recent Labs  Lab 06/30/22 1427 06/30/22 1434 07/01/22 0500  NA 137  --  141  K 2.7*  --  3.3*  CL 101  --  109  CO2 26  --  29  GLUCOSE 159*  --  100*  BUN 17  --  18  CREATININE 0.74  --  0.71  CALCIUM 9.3  --  9.0  MG  --  2.1  --    Liver Function Tests: Recent Labs  Lab 06/30/22 1427  AST 24  ALT  13  ALKPHOS 94  BILITOT 0.9  PROT 6.2*  ALBUMIN 3.5   No results for input(s): "LIPASE", "AMYLASE" in the last 168 hours. No results for input(s): "AMMONIA" in the last 168 hours. CBC: Recent Labs  Lab 06/30/22 1427 06/30/22 2048 07/01/22 0500  WBC 5.4 5.6 5.0  NEUTROABS 3.9  --   --   HGB 10.6* 9.6* 9.3*  HCT 36.5 32.3* 30.4*  MCV 82.6 81.4 78.4*  PLT 485* 356 426*   Cardiac Enzymes: No results for input(s): "CKTOTAL", "CKMB", "CKMBINDEX", "TROPONINI" in the last 168 hours. BNP: Invalid input(s): "POCBNP" CBG: No results for input(s): "GLUCAP" in the last 168 hours. D-Dimer Recent Labs    06/30/22 1427  DDIMER 0.93*   Hgb A1c No results for input(s): "HGBA1C" in the last 72 hours. Lipid Profile No results for input(s): "CHOL", "HDL", "LDLCALC", "TRIG", "CHOLHDL", "LDLDIRECT" in the last 72 hours. Thyroid function studies No results for input(s): "TSH", "T4TOTAL", "T3FREE", "THYROIDAB" in the last 72 hours.  Invalid input(s): "FREET3" Anemia work up No results for input(s): "VITAMINB12", "FOLATE", "FERRITIN", "TIBC", "IRON", "RETICCTPCT" in the last 72 hours. Urinalysis    Component Value Date/Time   COLORURINE YELLOW (A) 06/30/2022 2348   APPEARANCEUR CLEAR (A) 06/30/2022 2348   LABSPEC 1.011 06/30/2022 2348   PHURINE 5.0 06/30/2022 2348   GLUCOSEU NEGATIVE 06/30/2022 2348   HGBUR SMALL (A) 06/30/2022 2348   BILIRUBINUR NEGATIVE 06/30/2022 2348   KETONESUR NEGATIVE 06/30/2022 2348   PROTEINUR NEGATIVE 06/30/2022 2348   UROBILINOGEN 0.2 04/09/2012 1202   NITRITE NEGATIVE 06/30/2022 2348   LEUKOCYTESUR SMALL (A) 06/30/2022 2348   Sepsis Labs Recent Labs  Lab 06/30/22 1427 06/30/22 2048 07/01/22 0500  WBC 5.4 5.6 5.0   Microbiology No results found for this or any previous visit (from the past 240 hour(s)). Imaging DG Elbow 2 Views Left  Result Date: 06/30/2022 CLINICAL DATA:  Swelling EXAM: LEFT ELBOW - 2 VIEW COMPARISON:  None Available.  FINDINGS: There is no evidence of fracture, dislocation, or joint effusion.  There is no evidence of arthropathy or other focal bone abnormality. There is soft tissue swelling surrounding the elbow. Peripheral vascular calcifications are present. IMPRESSION: Soft tissue swelling surrounding the elbow. No acute fracture or dislocation. Electronically Signed   By: Ronney Asters M.D.   On: 06/30/2022 16:37   DG Wrist Complete Left  Result Date: 06/30/2022 CLINICAL DATA:  LEFT arm swelling, history of blood clot LEFT arm in December 2023 EXAM: LEFT WRIST - COMPLETE 3+ VIEW COMPARISON:  LEFT hand radiographs 04/24/2022 FINDINGS: Osseous demineralization. Chondrocalcinosis at TFCC. Narrowing of first CMC joint, STT joint and midcarpal joint. Marked widening of scapholunate interval consistent with torn scapholunate ligament. No acute fracture, dislocation, or bone destruction. IMPRESSION: Osseous demineralization with scattered degenerative changes, changes of CPPD, and chronic scapholunate ligament tear. No acute abnormalities. Electronically Signed   By: Lavonia Dana M.D.   On: 06/30/2022 16:36   US Venous Img Upper Uni Left  Result Date: 06/30/2022 CLINICAL DATA:  Swelling EXAM: LEFT UPPER EXTREMITY VENOUS DOPPLER ULTRASOUND TECHNIQUE: Gray-scale sonography with graded compression, as well as color Doppler and duplex ultrasound were performed to evaluate the upper extremity deep venous system from the level of the subclavian vein and including the jugular, axillary, basilic, radial, ulnar and upper cephalic vein. Spectral Doppler was utilized to evaluate flow at rest and with distal augmentation maneuvers. COMPARISON:  None Available. FINDINGS: Contralateral Subclavian Vein: Respiratory phasicity is normal and symmetric with the symptomatic side. No evidence of thrombus. Normal compressibility. Internal Jugular Vein: No evidence of thrombus. Normal compressibility, respiratory phasicity and response to  augmentation. Subclavian Vein: No evidence of thrombus. Normal compressibility, respiratory phasicity and response to augmentation. Axillary Vein: There is eccentric hypoechoic mural thrombus in the left axillary vein resulting in incomplete compressibility. Some persistent color flow signal is noted on color and Doppler. Cephalic Vein: No evidence of thrombus. Normal compressibility, respiratory phasicity and response to augmentation. Basilic Vein: There is eccentric hypoechoic mural thrombus resulting in incomplete compressibility. Some persistent color flow signal is noted on color and Doppler through the segment. Brachial Veins: No evidence of thrombus. Normal compressibility, respiratory phasicity and response to augmentation. Radial Veins: No evidence of thrombus. Normal compressibility, respiratory phasicity and response to augmentation. Ulnar Veins: No evidence of thrombus. Normal compressibility, respiratory phasicity and response to augmentation. Venous Reflux:  None visualized. Other Findings:  None visualized. IMPRESSION: 1. POSITIVE for nonocclusive DVT involving axillary vein. 2. Nonocclusive superficial thrombophlebitis of left basilic vein. Electronically Signed   By: Lucrezia Europe M.D.   On: 06/30/2022 16:07      Time coordinating discharge: over 30 minutes  SIGNED:  Emeterio Reeve DO [facility]

## 2022-07-01 NOTE — TOC Transition Note (Signed)
Transition of Care [facility]) - CM/SW Discharge Note   Patient Details  Name: Sarah [name] MRN: [id number] Date of Birth: 07-Feb-1933  Transition of Care [facility]) CM/SW Contact:  Rebekah Chesterfield, LCSW Phone Number: 07/01/2022, 11:33 AM   Clinical Narrative:    CSW informed Tammy at [place] pt will be discharged today.  CSW completed Medical Necc Form for [facility] transport; however, pt's daughter was available to transport pt to SNF. Nurse provided Rx, AVS, and DNR order signed by MD   Final next level of care: [place] ([facility] [place]) Barriers to Discharge: Barriers Resolved   Patient Goals and CMS Choice      Discharge Placement                         Discharge Plan and Services Additional resources added to the After Visit Summary for                                       Social Determinants of Health (SDOH) Interventions SDOH Screenings   Food Insecurity: No Food Insecurity (07/01/2022)  Housing: Low Risk  (07/01/2022)  Transportation Needs: No Transportation Needs (07/01/2022)  Utilities: Not At Risk (07/01/2022)  Tobacco Use: Low Risk  (06/21/2022)     Readmission Risk Interventions    11/04/2021   11:19 AM  Readmission Risk Prevention Plan  Transportation Screening Complete  HRI or Home Care Consult Complete  Social Work Consult for [place] Planning/Counseling Complete  Palliative Care Screening Not Applicable

## 2022-07-03 DIAGNOSIS — D649 Anemia, unspecified: Secondary | ICD-10-CM | POA: Diagnosis not present

## 2022-07-03 DIAGNOSIS — R609 Edema, unspecified: Secondary | ICD-10-CM | POA: Diagnosis not present

## 2022-07-03 DIAGNOSIS — E876 Hypokalemia: Secondary | ICD-10-CM | POA: Diagnosis not present

## 2022-07-03 DIAGNOSIS — I82622 Acute embolism and thrombosis of deep veins of left upper extremity: Secondary | ICD-10-CM | POA: Diagnosis not present

## 2022-07-05 DIAGNOSIS — D649 Anemia, unspecified: Secondary | ICD-10-CM | POA: Diagnosis not present

## 2022-07-05 DIAGNOSIS — F32A Depression, unspecified: Secondary | ICD-10-CM | POA: Diagnosis not present

## 2022-07-05 DIAGNOSIS — Z86718 Personal history of other venous thrombosis and embolism: Secondary | ICD-10-CM | POA: Diagnosis not present

## 2022-07-05 DIAGNOSIS — M6281 Muscle weakness (generalized): Secondary | ICD-10-CM | POA: Diagnosis not present

## 2022-07-07 DIAGNOSIS — E039 Hypothyroidism, unspecified: Secondary | ICD-10-CM | POA: Diagnosis not present

## 2022-07-07 DIAGNOSIS — Z7901 Long term (current) use of anticoagulants: Secondary | ICD-10-CM | POA: Diagnosis not present

## 2022-07-07 DIAGNOSIS — E785 Hyperlipidemia, unspecified: Secondary | ICD-10-CM | POA: Diagnosis not present

## 2022-07-07 DIAGNOSIS — M1990 Unspecified osteoarthritis, unspecified site: Secondary | ICD-10-CM | POA: Diagnosis not present

## 2022-07-07 DIAGNOSIS — K219 Gastro-esophageal reflux disease without esophagitis: Secondary | ICD-10-CM | POA: Diagnosis not present

## 2022-07-07 DIAGNOSIS — D500 Iron deficiency anemia secondary to blood loss (chronic): Secondary | ICD-10-CM | POA: Diagnosis not present

## 2022-07-07 DIAGNOSIS — G629 Polyneuropathy, unspecified: Secondary | ICD-10-CM | POA: Diagnosis not present

## 2022-07-07 DIAGNOSIS — Z9181 History of falling: Secondary | ICD-10-CM | POA: Diagnosis not present

## 2022-07-07 DIAGNOSIS — E559 Vitamin D deficiency, unspecified: Secondary | ICD-10-CM | POA: Diagnosis not present

## 2022-07-07 DIAGNOSIS — R41841 Cognitive communication deficit: Secondary | ICD-10-CM | POA: Diagnosis not present

## 2022-07-07 DIAGNOSIS — Z8673 Personal history of transient ischemic attack (TIA), and cerebral infarction without residual deficits: Secondary | ICD-10-CM | POA: Diagnosis not present

## 2022-07-07 DIAGNOSIS — G4700 Insomnia, unspecified: Secondary | ICD-10-CM | POA: Diagnosis not present

## 2022-07-07 DIAGNOSIS — F32A Depression, unspecified: Secondary | ICD-10-CM | POA: Diagnosis not present

## 2022-07-07 DIAGNOSIS — Z853 Personal history of malignant neoplasm of breast: Secondary | ICD-10-CM | POA: Diagnosis not present

## 2022-07-07 DIAGNOSIS — K862 Cyst of pancreas: Secondary | ICD-10-CM | POA: Diagnosis not present

## 2022-07-07 DIAGNOSIS — Z86711 Personal history of pulmonary embolism: Secondary | ICD-10-CM | POA: Diagnosis not present

## 2022-07-07 DIAGNOSIS — Z86718 Personal history of other venous thrombosis and embolism: Secondary | ICD-10-CM | POA: Diagnosis not present

## 2022-07-07 DIAGNOSIS — I10 Essential (primary) hypertension: Secondary | ICD-10-CM | POA: Diagnosis not present

## 2022-07-07 DIAGNOSIS — F419 Anxiety disorder, unspecified: Secondary | ICD-10-CM | POA: Diagnosis not present

## 2022-07-07 DIAGNOSIS — R4182 Altered mental status, unspecified: Secondary | ICD-10-CM | POA: Diagnosis not present

## 2022-07-07 DIAGNOSIS — H9190 Unspecified hearing loss, unspecified ear: Secondary | ICD-10-CM | POA: Diagnosis not present

## 2022-07-07 DIAGNOSIS — C9151 Adult T-cell lymphoma/leukemia (HTLV-1-associated), in remission: Secondary | ICD-10-CM | POA: Diagnosis not present

## 2022-07-07 DIAGNOSIS — H409 Unspecified glaucoma: Secondary | ICD-10-CM | POA: Diagnosis not present

## 2022-07-07 DIAGNOSIS — W19XXXD Unspecified fall, subsequent encounter: Secondary | ICD-10-CM | POA: Diagnosis not present

## 2022-07-07 DIAGNOSIS — S36032D Major laceration of spleen, subsequent encounter: Secondary | ICD-10-CM | POA: Diagnosis not present

## 2022-07-11 DIAGNOSIS — E785 Hyperlipidemia, unspecified: Secondary | ICD-10-CM | POA: Diagnosis not present

## 2022-07-11 DIAGNOSIS — K219 Gastro-esophageal reflux disease without esophagitis: Secondary | ICD-10-CM | POA: Diagnosis not present

## 2022-07-11 DIAGNOSIS — E559 Vitamin D deficiency, unspecified: Secondary | ICD-10-CM | POA: Diagnosis not present

## 2022-07-11 DIAGNOSIS — S36032D Major laceration of spleen, subsequent encounter: Secondary | ICD-10-CM | POA: Diagnosis not present

## 2022-07-11 DIAGNOSIS — I10 Essential (primary) hypertension: Secondary | ICD-10-CM | POA: Diagnosis not present

## 2022-07-11 DIAGNOSIS — E039 Hypothyroidism, unspecified: Secondary | ICD-10-CM | POA: Diagnosis not present

## 2022-07-13 DIAGNOSIS — S36032D Major laceration of spleen, subsequent encounter: Secondary | ICD-10-CM | POA: Diagnosis not present

## 2022-07-13 DIAGNOSIS — I10 Essential (primary) hypertension: Secondary | ICD-10-CM | POA: Diagnosis not present

## 2022-07-13 DIAGNOSIS — E785 Hyperlipidemia, unspecified: Secondary | ICD-10-CM | POA: Diagnosis not present

## 2022-07-13 DIAGNOSIS — E559 Vitamin D deficiency, unspecified: Secondary | ICD-10-CM | POA: Diagnosis not present

## 2022-07-13 DIAGNOSIS — E039 Hypothyroidism, unspecified: Secondary | ICD-10-CM | POA: Diagnosis not present

## 2022-07-13 DIAGNOSIS — K219 Gastro-esophageal reflux disease without esophagitis: Secondary | ICD-10-CM | POA: Diagnosis not present

## 2022-07-18 DIAGNOSIS — E559 Vitamin D deficiency, unspecified: Secondary | ICD-10-CM | POA: Diagnosis not present

## 2022-07-18 DIAGNOSIS — E785 Hyperlipidemia, unspecified: Secondary | ICD-10-CM | POA: Diagnosis not present

## 2022-07-18 DIAGNOSIS — I10 Essential (primary) hypertension: Secondary | ICD-10-CM | POA: Diagnosis not present

## 2022-07-18 DIAGNOSIS — K219 Gastro-esophageal reflux disease without esophagitis: Secondary | ICD-10-CM | POA: Diagnosis not present

## 2022-07-18 DIAGNOSIS — S36032D Major laceration of spleen, subsequent encounter: Secondary | ICD-10-CM | POA: Diagnosis not present

## 2022-07-18 DIAGNOSIS — E039 Hypothyroidism, unspecified: Secondary | ICD-10-CM | POA: Diagnosis not present

## 2022-07-19 DIAGNOSIS — S36032D Major laceration of spleen, subsequent encounter: Secondary | ICD-10-CM | POA: Diagnosis not present

## 2022-07-19 DIAGNOSIS — K219 Gastro-esophageal reflux disease without esophagitis: Secondary | ICD-10-CM | POA: Diagnosis not present

## 2022-07-19 DIAGNOSIS — E559 Vitamin D deficiency, unspecified: Secondary | ICD-10-CM | POA: Diagnosis not present

## 2022-07-19 DIAGNOSIS — E785 Hyperlipidemia, unspecified: Secondary | ICD-10-CM | POA: Diagnosis not present

## 2022-07-19 DIAGNOSIS — E039 Hypothyroidism, unspecified: Secondary | ICD-10-CM | POA: Diagnosis not present

## 2022-07-19 DIAGNOSIS — I10 Essential (primary) hypertension: Secondary | ICD-10-CM | POA: Diagnosis not present

## 2022-07-20 DIAGNOSIS — E039 Hypothyroidism, unspecified: Secondary | ICD-10-CM | POA: Diagnosis not present

## 2022-07-20 DIAGNOSIS — S36032D Major laceration of spleen, subsequent encounter: Secondary | ICD-10-CM | POA: Diagnosis not present

## 2022-07-20 DIAGNOSIS — E785 Hyperlipidemia, unspecified: Secondary | ICD-10-CM | POA: Diagnosis not present

## 2022-07-20 DIAGNOSIS — I10 Essential (primary) hypertension: Secondary | ICD-10-CM | POA: Diagnosis not present

## 2022-07-20 DIAGNOSIS — R630 Anorexia: Secondary | ICD-10-CM | POA: Diagnosis not present

## 2022-07-20 DIAGNOSIS — G309 Alzheimer's disease, unspecified: Secondary | ICD-10-CM | POA: Diagnosis not present

## 2022-07-20 DIAGNOSIS — R609 Edema, unspecified: Secondary | ICD-10-CM | POA: Diagnosis not present

## 2022-07-20 DIAGNOSIS — F02C0 Dementia in other diseases classified elsewhere, severe, without behavioral disturbance, psychotic disturbance, mood disturbance, and anxiety: Secondary | ICD-10-CM | POA: Diagnosis not present

## 2022-07-20 DIAGNOSIS — K219 Gastro-esophageal reflux disease without esophagitis: Secondary | ICD-10-CM | POA: Diagnosis not present

## 2022-07-20 DIAGNOSIS — Z950 Presence of cardiac pacemaker: Secondary | ICD-10-CM | POA: Diagnosis not present

## 2022-07-20 DIAGNOSIS — G4700 Insomnia, unspecified: Secondary | ICD-10-CM | POA: Diagnosis not present

## 2022-07-20 DIAGNOSIS — W19XXXD Unspecified fall, subsequent encounter: Secondary | ICD-10-CM | POA: Diagnosis not present

## 2022-07-20 DIAGNOSIS — E559 Vitamin D deficiency, unspecified: Secondary | ICD-10-CM | POA: Diagnosis not present

## 2022-07-21 DIAGNOSIS — S36032D Major laceration of spleen, subsequent encounter: Secondary | ICD-10-CM | POA: Diagnosis not present

## 2022-07-21 DIAGNOSIS — E785 Hyperlipidemia, unspecified: Secondary | ICD-10-CM | POA: Diagnosis not present

## 2022-07-21 DIAGNOSIS — K219 Gastro-esophageal reflux disease without esophagitis: Secondary | ICD-10-CM | POA: Diagnosis not present

## 2022-07-21 DIAGNOSIS — E039 Hypothyroidism, unspecified: Secondary | ICD-10-CM | POA: Diagnosis not present

## 2022-07-21 DIAGNOSIS — E559 Vitamin D deficiency, unspecified: Secondary | ICD-10-CM | POA: Diagnosis not present

## 2022-07-21 DIAGNOSIS — I10 Essential (primary) hypertension: Secondary | ICD-10-CM | POA: Diagnosis not present

## 2022-07-24 ENCOUNTER — Other Ambulatory Visit: Payer: Self-pay | Admitting: Internal Medicine

## 2022-07-24 ENCOUNTER — Ambulatory Visit
Admission: RE | Admit: 2022-07-24 | Discharge: 2022-07-24 | Disposition: A | Discharge status: Home / Self Care | Payer: Medicare Other | Source: Ambulatory Visit | Attending: Internal Medicine | Admitting: Internal Medicine

## 2022-07-24 DIAGNOSIS — R600 Localized edema: Secondary | ICD-10-CM

## 2022-07-24 DIAGNOSIS — R059 Cough, unspecified: Secondary | ICD-10-CM | POA: Diagnosis not present

## 2022-07-24 DIAGNOSIS — R051 Acute cough: Secondary | ICD-10-CM | POA: Diagnosis not present

## 2022-07-24 DIAGNOSIS — I872 Venous insufficiency (chronic) (peripheral): Secondary | ICD-10-CM | POA: Diagnosis not present

## 2022-07-27 DIAGNOSIS — R600 Localized edema: Secondary | ICD-10-CM | POA: Diagnosis not present

## 2022-07-27 DIAGNOSIS — R079 Chest pain, unspecified: Secondary | ICD-10-CM | POA: Diagnosis not present

## 2022-07-27 DIAGNOSIS — R051 Acute cough: Secondary | ICD-10-CM | POA: Diagnosis not present

## 2022-07-27 DIAGNOSIS — R110 Nausea: Secondary | ICD-10-CM | POA: Diagnosis not present

## 2022-07-27 DIAGNOSIS — E559 Vitamin D deficiency, unspecified: Secondary | ICD-10-CM | POA: Diagnosis not present

## 2022-07-27 DIAGNOSIS — E039 Hypothyroidism, unspecified: Secondary | ICD-10-CM | POA: Diagnosis not present

## 2022-07-27 DIAGNOSIS — S36032D Major laceration of spleen, subsequent encounter: Secondary | ICD-10-CM | POA: Diagnosis not present

## 2022-07-27 DIAGNOSIS — K219 Gastro-esophageal reflux disease without esophagitis: Secondary | ICD-10-CM | POA: Diagnosis not present

## 2022-07-27 DIAGNOSIS — I10 Essential (primary) hypertension: Secondary | ICD-10-CM | POA: Diagnosis not present

## 2022-07-27 DIAGNOSIS — E785 Hyperlipidemia, unspecified: Secondary | ICD-10-CM | POA: Diagnosis not present

## 2022-07-27 DIAGNOSIS — J189 Pneumonia, unspecified organism: Secondary | ICD-10-CM | POA: Diagnosis not present

## 2022-07-28 DIAGNOSIS — E559 Vitamin D deficiency, unspecified: Secondary | ICD-10-CM | POA: Diagnosis not present

## 2022-07-28 DIAGNOSIS — E785 Hyperlipidemia, unspecified: Secondary | ICD-10-CM | POA: Diagnosis not present

## 2022-07-28 DIAGNOSIS — E039 Hypothyroidism, unspecified: Secondary | ICD-10-CM | POA: Diagnosis not present

## 2022-07-28 DIAGNOSIS — I10 Essential (primary) hypertension: Secondary | ICD-10-CM | POA: Diagnosis not present

## 2022-07-28 DIAGNOSIS — K219 Gastro-esophageal reflux disease without esophagitis: Secondary | ICD-10-CM | POA: Diagnosis not present

## 2022-07-28 DIAGNOSIS — S36032D Major laceration of spleen, subsequent encounter: Secondary | ICD-10-CM | POA: Diagnosis not present

## 2022-07-31 DIAGNOSIS — E039 Hypothyroidism, unspecified: Secondary | ICD-10-CM | POA: Diagnosis not present

## 2022-07-31 DIAGNOSIS — E785 Hyperlipidemia, unspecified: Secondary | ICD-10-CM | POA: Diagnosis not present

## 2022-07-31 DIAGNOSIS — K219 Gastro-esophageal reflux disease without esophagitis: Secondary | ICD-10-CM | POA: Diagnosis not present

## 2022-07-31 DIAGNOSIS — I10 Essential (primary) hypertension: Secondary | ICD-10-CM | POA: Diagnosis not present

## 2022-07-31 DIAGNOSIS — E559 Vitamin D deficiency, unspecified: Secondary | ICD-10-CM | POA: Diagnosis not present

## 2022-07-31 DIAGNOSIS — S36032D Major laceration of spleen, subsequent encounter: Secondary | ICD-10-CM | POA: Diagnosis not present

## 2022-08-01 DIAGNOSIS — I872 Venous insufficiency (chronic) (peripheral): Secondary | ICD-10-CM | POA: Diagnosis not present

## 2022-08-01 DIAGNOSIS — R600 Localized edema: Secondary | ICD-10-CM | POA: Diagnosis not present

## 2022-08-01 DIAGNOSIS — R051 Acute cough: Secondary | ICD-10-CM | POA: Diagnosis not present

## 2022-08-02 ENCOUNTER — Ambulatory Visit (INDEPENDENT_AMBULATORY_CARE_PROVIDER_SITE_OTHER): Payer: Medicare Other

## 2022-08-02 DIAGNOSIS — I442 Atrioventricular block, complete: Secondary | ICD-10-CM

## 2022-08-02 DIAGNOSIS — E039 Hypothyroidism, unspecified: Secondary | ICD-10-CM | POA: Diagnosis not present

## 2022-08-02 DIAGNOSIS — E785 Hyperlipidemia, unspecified: Secondary | ICD-10-CM | POA: Diagnosis not present

## 2022-08-02 DIAGNOSIS — E559 Vitamin D deficiency, unspecified: Secondary | ICD-10-CM | POA: Diagnosis not present

## 2022-08-02 DIAGNOSIS — K219 Gastro-esophageal reflux disease without esophagitis: Secondary | ICD-10-CM | POA: Diagnosis not present

## 2022-08-02 DIAGNOSIS — S36032D Major laceration of spleen, subsequent encounter: Secondary | ICD-10-CM | POA: Diagnosis not present

## 2022-08-02 DIAGNOSIS — I10 Essential (primary) hypertension: Secondary | ICD-10-CM | POA: Diagnosis not present

## 2022-08-03 DIAGNOSIS — Z7401 Bed confinement status: Secondary | ICD-10-CM | POA: Diagnosis not present

## 2022-08-03 DIAGNOSIS — L97819 Non-pressure chronic ulcer of other part of right lower leg with unspecified severity: Secondary | ICD-10-CM | POA: Diagnosis not present

## 2022-08-03 DIAGNOSIS — I509 Heart failure, unspecified: Secondary | ICD-10-CM | POA: Diagnosis not present

## 2022-08-03 DIAGNOSIS — R110 Nausea: Secondary | ICD-10-CM | POA: Diagnosis not present

## 2022-08-03 DIAGNOSIS — Z9081 Acquired absence of spleen: Secondary | ICD-10-CM | POA: Diagnosis not present

## 2022-08-03 DIAGNOSIS — R634 Abnormal weight loss: Secondary | ICD-10-CM | POA: Diagnosis not present

## 2022-08-03 DIAGNOSIS — E039 Hypothyroidism, unspecified: Secondary | ICD-10-CM | POA: Diagnosis not present

## 2022-08-03 DIAGNOSIS — Z86711 Personal history of pulmonary embolism: Secondary | ICD-10-CM | POA: Diagnosis not present

## 2022-08-03 DIAGNOSIS — Z8572 Personal history of non-Hodgkin lymphomas: Secondary | ICD-10-CM | POA: Diagnosis not present

## 2022-08-03 DIAGNOSIS — L97829 Non-pressure chronic ulcer of other part of left lower leg with unspecified severity: Secondary | ICD-10-CM | POA: Diagnosis not present

## 2022-08-03 DIAGNOSIS — E559 Vitamin D deficiency, unspecified: Secondary | ICD-10-CM | POA: Diagnosis not present

## 2022-08-03 DIAGNOSIS — I872 Venous insufficiency (chronic) (peripheral): Secondary | ICD-10-CM | POA: Diagnosis not present

## 2022-08-03 DIAGNOSIS — Z8673 Personal history of transient ischemic attack (TIA), and cerebral infarction without residual deficits: Secondary | ICD-10-CM | POA: Diagnosis not present

## 2022-08-03 DIAGNOSIS — I739 Peripheral vascular disease, unspecified: Secondary | ICD-10-CM | POA: Diagnosis not present

## 2022-08-03 DIAGNOSIS — E785 Hyperlipidemia, unspecified: Secondary | ICD-10-CM | POA: Diagnosis not present

## 2022-08-03 DIAGNOSIS — R627 Adult failure to thrive: Secondary | ICD-10-CM | POA: Diagnosis not present

## 2022-08-03 DIAGNOSIS — F32A Depression, unspecified: Secondary | ICD-10-CM | POA: Diagnosis not present

## 2022-08-03 DIAGNOSIS — I442 Atrioventricular block, complete: Secondary | ICD-10-CM | POA: Diagnosis not present

## 2022-08-03 DIAGNOSIS — J189 Pneumonia, unspecified organism: Secondary | ICD-10-CM | POA: Diagnosis not present

## 2022-08-03 DIAGNOSIS — K219 Gastro-esophageal reflux disease without esophagitis: Secondary | ICD-10-CM | POA: Diagnosis not present

## 2022-08-03 DIAGNOSIS — I4891 Unspecified atrial fibrillation: Secondary | ICD-10-CM | POA: Diagnosis not present

## 2022-08-03 DIAGNOSIS — F419 Anxiety disorder, unspecified: Secondary | ICD-10-CM | POA: Diagnosis not present

## 2022-08-03 DIAGNOSIS — I10 Essential (primary) hypertension: Secondary | ICD-10-CM | POA: Diagnosis not present

## 2022-08-03 DIAGNOSIS — R296 Repeated falls: Secondary | ICD-10-CM | POA: Diagnosis not present

## 2022-08-03 DIAGNOSIS — R051 Acute cough: Secondary | ICD-10-CM | POA: Diagnosis not present

## 2022-08-03 DIAGNOSIS — Z950 Presence of cardiac pacemaker: Secondary | ICD-10-CM | POA: Diagnosis not present

## 2022-08-03 DIAGNOSIS — S36032D Major laceration of spleen, subsequent encounter: Secondary | ICD-10-CM | POA: Diagnosis not present

## 2022-08-03 DIAGNOSIS — I110 Hypertensive heart disease with heart failure: Secondary | ICD-10-CM | POA: Diagnosis not present

## 2022-08-03 DIAGNOSIS — F02C0 Dementia in other diseases classified elsewhere, severe, without behavioral disturbance, psychotic disturbance, mood disturbance, and anxiety: Secondary | ICD-10-CM | POA: Diagnosis not present

## 2022-08-03 DIAGNOSIS — G309 Alzheimer's disease, unspecified: Secondary | ICD-10-CM | POA: Diagnosis not present

## 2022-08-03 DIAGNOSIS — S20219A Contusion of unspecified front wall of thorax, initial encounter: Secondary | ICD-10-CM | POA: Diagnosis not present

## 2022-08-03 DIAGNOSIS — I82622 Acute embolism and thrombosis of deep veins of left upper extremity: Secondary | ICD-10-CM | POA: Diagnosis not present

## 2022-08-03 LAB — CUP PACEART REMOTE DEVICE CHECK
Battery Voltage: 90
Date Time Interrogation Session: 20240314071643
Implantable Lead Connection Status: 753985
Implantable Lead Connection Status: 753985
Implantable Lead Implant Date: 20230613
Implantable Lead Implant Date: 20230613
Implantable Lead Location: 753859
Implantable Lead Location: 753860
Implantable Lead Model: 377
Implantable Lead Model: 377
Implantable Lead Serial Number: 8000826615
Implantable Lead Serial Number: 8000886802
Implantable Pulse Generator Implant Date: 20230613
Pulse Gen Model: 407145
Pulse Gen Serial Number: 70422578

## 2022-08-04 DIAGNOSIS — I739 Peripheral vascular disease, unspecified: Secondary | ICD-10-CM | POA: Diagnosis not present

## 2022-08-04 DIAGNOSIS — I4891 Unspecified atrial fibrillation: Secondary | ICD-10-CM | POA: Diagnosis not present

## 2022-08-04 DIAGNOSIS — I872 Venous insufficiency (chronic) (peripheral): Secondary | ICD-10-CM | POA: Diagnosis not present

## 2022-08-04 DIAGNOSIS — I442 Atrioventricular block, complete: Secondary | ICD-10-CM | POA: Diagnosis not present

## 2022-08-04 DIAGNOSIS — L97819 Non-pressure chronic ulcer of other part of right lower leg with unspecified severity: Secondary | ICD-10-CM | POA: Diagnosis not present

## 2022-08-04 DIAGNOSIS — R634 Abnormal weight loss: Secondary | ICD-10-CM | POA: Diagnosis not present

## 2022-08-07 DIAGNOSIS — L97819 Non-pressure chronic ulcer of other part of right lower leg with unspecified severity: Secondary | ICD-10-CM | POA: Diagnosis not present

## 2022-08-07 DIAGNOSIS — I442 Atrioventricular block, complete: Secondary | ICD-10-CM | POA: Diagnosis not present

## 2022-08-07 DIAGNOSIS — R634 Abnormal weight loss: Secondary | ICD-10-CM | POA: Diagnosis not present

## 2022-08-07 DIAGNOSIS — I872 Venous insufficiency (chronic) (peripheral): Secondary | ICD-10-CM | POA: Diagnosis not present

## 2022-08-07 DIAGNOSIS — I4891 Unspecified atrial fibrillation: Secondary | ICD-10-CM | POA: Diagnosis not present

## 2022-08-07 DIAGNOSIS — I739 Peripheral vascular disease, unspecified: Secondary | ICD-10-CM | POA: Diagnosis not present

## 2022-08-08 DIAGNOSIS — I442 Atrioventricular block, complete: Secondary | ICD-10-CM | POA: Diagnosis not present

## 2022-08-08 DIAGNOSIS — L97819 Non-pressure chronic ulcer of other part of right lower leg with unspecified severity: Secondary | ICD-10-CM | POA: Diagnosis not present

## 2022-08-08 DIAGNOSIS — I872 Venous insufficiency (chronic) (peripheral): Secondary | ICD-10-CM | POA: Diagnosis not present

## 2022-08-08 DIAGNOSIS — I739 Peripheral vascular disease, unspecified: Secondary | ICD-10-CM | POA: Diagnosis not present

## 2022-08-08 DIAGNOSIS — R634 Abnormal weight loss: Secondary | ICD-10-CM | POA: Diagnosis not present

## 2022-08-08 DIAGNOSIS — I4891 Unspecified atrial fibrillation: Secondary | ICD-10-CM | POA: Diagnosis not present

## 2022-08-09 DIAGNOSIS — I872 Venous insufficiency (chronic) (peripheral): Secondary | ICD-10-CM | POA: Diagnosis not present

## 2022-08-09 DIAGNOSIS — R634 Abnormal weight loss: Secondary | ICD-10-CM | POA: Diagnosis not present

## 2022-08-09 DIAGNOSIS — I4891 Unspecified atrial fibrillation: Secondary | ICD-10-CM | POA: Diagnosis not present

## 2022-08-09 DIAGNOSIS — I739 Peripheral vascular disease, unspecified: Secondary | ICD-10-CM | POA: Diagnosis not present

## 2022-08-09 DIAGNOSIS — I442 Atrioventricular block, complete: Secondary | ICD-10-CM | POA: Diagnosis not present

## 2022-08-09 DIAGNOSIS — L97819 Non-pressure chronic ulcer of other part of right lower leg with unspecified severity: Secondary | ICD-10-CM | POA: Diagnosis not present

## 2022-08-10 DIAGNOSIS — L97819 Non-pressure chronic ulcer of other part of right lower leg with unspecified severity: Secondary | ICD-10-CM | POA: Diagnosis not present

## 2022-08-10 DIAGNOSIS — R051 Acute cough: Secondary | ICD-10-CM | POA: Diagnosis not present

## 2022-08-10 DIAGNOSIS — I739 Peripheral vascular disease, unspecified: Secondary | ICD-10-CM | POA: Diagnosis not present

## 2022-08-10 DIAGNOSIS — R634 Abnormal weight loss: Secondary | ICD-10-CM | POA: Diagnosis not present

## 2022-08-10 DIAGNOSIS — R110 Nausea: Secondary | ICD-10-CM | POA: Diagnosis not present

## 2022-08-10 DIAGNOSIS — I872 Venous insufficiency (chronic) (peripheral): Secondary | ICD-10-CM | POA: Diagnosis not present

## 2022-08-10 DIAGNOSIS — I442 Atrioventricular block, complete: Secondary | ICD-10-CM | POA: Diagnosis not present

## 2022-08-10 DIAGNOSIS — J189 Pneumonia, unspecified organism: Secondary | ICD-10-CM | POA: Diagnosis not present

## 2022-08-10 DIAGNOSIS — I4891 Unspecified atrial fibrillation: Secondary | ICD-10-CM | POA: Diagnosis not present

## 2022-08-10 DIAGNOSIS — R609 Edema, unspecified: Secondary | ICD-10-CM | POA: Diagnosis not present

## 2022-08-10 DIAGNOSIS — M79661 Pain in right lower leg: Secondary | ICD-10-CM | POA: Diagnosis not present

## 2022-08-10 DIAGNOSIS — R0789 Other chest pain: Secondary | ICD-10-CM | POA: Diagnosis not present

## 2022-08-10 DIAGNOSIS — M25551 Pain in right hip: Secondary | ICD-10-CM | POA: Diagnosis not present

## 2022-08-11 DIAGNOSIS — I739 Peripheral vascular disease, unspecified: Secondary | ICD-10-CM | POA: Diagnosis not present

## 2022-08-11 DIAGNOSIS — I442 Atrioventricular block, complete: Secondary | ICD-10-CM | POA: Diagnosis not present

## 2022-08-11 DIAGNOSIS — I872 Venous insufficiency (chronic) (peripheral): Secondary | ICD-10-CM | POA: Diagnosis not present

## 2022-08-11 DIAGNOSIS — I4891 Unspecified atrial fibrillation: Secondary | ICD-10-CM | POA: Diagnosis not present

## 2022-08-11 DIAGNOSIS — R634 Abnormal weight loss: Secondary | ICD-10-CM | POA: Diagnosis not present

## 2022-08-11 DIAGNOSIS — L97819 Non-pressure chronic ulcer of other part of right lower leg with unspecified severity: Secondary | ICD-10-CM | POA: Diagnosis not present

## 2022-08-14 DIAGNOSIS — I442 Atrioventricular block, complete: Secondary | ICD-10-CM | POA: Diagnosis not present

## 2022-08-14 DIAGNOSIS — L97819 Non-pressure chronic ulcer of other part of right lower leg with unspecified severity: Secondary | ICD-10-CM | POA: Diagnosis not present

## 2022-08-14 DIAGNOSIS — I739 Peripheral vascular disease, unspecified: Secondary | ICD-10-CM | POA: Diagnosis not present

## 2022-08-14 DIAGNOSIS — I872 Venous insufficiency (chronic) (peripheral): Secondary | ICD-10-CM | POA: Diagnosis not present

## 2022-08-14 DIAGNOSIS — I4891 Unspecified atrial fibrillation: Secondary | ICD-10-CM | POA: Diagnosis not present

## 2022-08-14 DIAGNOSIS — R634 Abnormal weight loss: Secondary | ICD-10-CM | POA: Diagnosis not present

## 2022-08-15 DIAGNOSIS — R634 Abnormal weight loss: Secondary | ICD-10-CM | POA: Diagnosis not present

## 2022-08-15 DIAGNOSIS — I4891 Unspecified atrial fibrillation: Secondary | ICD-10-CM | POA: Diagnosis not present

## 2022-08-15 DIAGNOSIS — I739 Peripheral vascular disease, unspecified: Secondary | ICD-10-CM | POA: Diagnosis not present

## 2022-08-15 DIAGNOSIS — I442 Atrioventricular block, complete: Secondary | ICD-10-CM | POA: Diagnosis not present

## 2022-08-15 DIAGNOSIS — L97819 Non-pressure chronic ulcer of other part of right lower leg with unspecified severity: Secondary | ICD-10-CM | POA: Diagnosis not present

## 2022-08-15 DIAGNOSIS — I872 Venous insufficiency (chronic) (peripheral): Secondary | ICD-10-CM | POA: Diagnosis not present

## 2022-08-17 DIAGNOSIS — G309 Alzheimer's disease, unspecified: Secondary | ICD-10-CM | POA: Diagnosis not present

## 2022-08-17 DIAGNOSIS — I442 Atrioventricular block, complete: Secondary | ICD-10-CM | POA: Diagnosis not present

## 2022-08-17 DIAGNOSIS — E039 Hypothyroidism, unspecified: Secondary | ICD-10-CM | POA: Diagnosis not present

## 2022-08-17 DIAGNOSIS — L97819 Non-pressure chronic ulcer of other part of right lower leg with unspecified severity: Secondary | ICD-10-CM | POA: Diagnosis not present

## 2022-08-17 DIAGNOSIS — I739 Peripheral vascular disease, unspecified: Secondary | ICD-10-CM | POA: Diagnosis not present

## 2022-08-17 DIAGNOSIS — J189 Pneumonia, unspecified organism: Secondary | ICD-10-CM | POA: Diagnosis not present

## 2022-08-17 DIAGNOSIS — R634 Abnormal weight loss: Secondary | ICD-10-CM | POA: Diagnosis not present

## 2022-08-17 DIAGNOSIS — R52 Pain, unspecified: Secondary | ICD-10-CM | POA: Diagnosis not present

## 2022-08-17 DIAGNOSIS — F02C0 Dementia in other diseases classified elsewhere, severe, without behavioral disturbance, psychotic disturbance, mood disturbance, and anxiety: Secondary | ICD-10-CM | POA: Diagnosis not present

## 2022-08-17 DIAGNOSIS — I872 Venous insufficiency (chronic) (peripheral): Secondary | ICD-10-CM | POA: Diagnosis not present

## 2022-08-17 DIAGNOSIS — R269 Unspecified abnormalities of gait and mobility: Secondary | ICD-10-CM | POA: Diagnosis not present

## 2022-08-17 DIAGNOSIS — I4891 Unspecified atrial fibrillation: Secondary | ICD-10-CM | POA: Diagnosis not present

## 2022-08-18 DIAGNOSIS — E7800 Pure hypercholesterolemia, unspecified: Secondary | ICD-10-CM | POA: Diagnosis not present

## 2022-08-18 DIAGNOSIS — E038 Other specified hypothyroidism: Secondary | ICD-10-CM | POA: Diagnosis not present

## 2022-08-21 DIAGNOSIS — I82622 Acute embolism and thrombosis of deep veins of left upper extremity: Secondary | ICD-10-CM | POA: Diagnosis not present

## 2022-08-21 DIAGNOSIS — R296 Repeated falls: Secondary | ICD-10-CM | POA: Diagnosis not present

## 2022-08-21 DIAGNOSIS — Z950 Presence of cardiac pacemaker: Secondary | ICD-10-CM | POA: Diagnosis not present

## 2022-08-21 DIAGNOSIS — Z86711 Personal history of pulmonary embolism: Secondary | ICD-10-CM | POA: Diagnosis not present

## 2022-08-21 DIAGNOSIS — F32A Depression, unspecified: Secondary | ICD-10-CM | POA: Diagnosis not present

## 2022-08-21 DIAGNOSIS — L97829 Non-pressure chronic ulcer of other part of left lower leg with unspecified severity: Secondary | ICD-10-CM | POA: Diagnosis not present

## 2022-08-21 DIAGNOSIS — Z8572 Personal history of non-Hodgkin lymphomas: Secondary | ICD-10-CM | POA: Diagnosis not present

## 2022-08-21 DIAGNOSIS — Z8673 Personal history of transient ischemic attack (TIA), and cerebral infarction without residual deficits: Secondary | ICD-10-CM | POA: Diagnosis not present

## 2022-08-21 DIAGNOSIS — L97819 Non-pressure chronic ulcer of other part of right lower leg with unspecified severity: Secondary | ICD-10-CM | POA: Diagnosis not present

## 2022-08-21 DIAGNOSIS — R627 Adult failure to thrive: Secondary | ICD-10-CM | POA: Diagnosis not present

## 2022-08-21 DIAGNOSIS — E559 Vitamin D deficiency, unspecified: Secondary | ICD-10-CM | POA: Diagnosis not present

## 2022-08-21 DIAGNOSIS — I442 Atrioventricular block, complete: Secondary | ICD-10-CM | POA: Diagnosis not present

## 2022-08-21 DIAGNOSIS — Z7401 Bed confinement status: Secondary | ICD-10-CM | POA: Diagnosis not present

## 2022-08-21 DIAGNOSIS — E785 Hyperlipidemia, unspecified: Secondary | ICD-10-CM | POA: Diagnosis not present

## 2022-08-21 DIAGNOSIS — R634 Abnormal weight loss: Secondary | ICD-10-CM | POA: Diagnosis not present

## 2022-08-21 DIAGNOSIS — I4891 Unspecified atrial fibrillation: Secondary | ICD-10-CM | POA: Diagnosis not present

## 2022-08-21 DIAGNOSIS — I509 Heart failure, unspecified: Secondary | ICD-10-CM | POA: Diagnosis not present

## 2022-08-21 DIAGNOSIS — Z9081 Acquired absence of spleen: Secondary | ICD-10-CM | POA: Diagnosis not present

## 2022-08-21 DIAGNOSIS — I872 Venous insufficiency (chronic) (peripheral): Secondary | ICD-10-CM | POA: Diagnosis not present

## 2022-08-21 DIAGNOSIS — I110 Hypertensive heart disease with heart failure: Secondary | ICD-10-CM | POA: Diagnosis not present

## 2022-08-21 DIAGNOSIS — E039 Hypothyroidism, unspecified: Secondary | ICD-10-CM | POA: Diagnosis not present

## 2022-08-21 DIAGNOSIS — I739 Peripheral vascular disease, unspecified: Secondary | ICD-10-CM | POA: Diagnosis not present

## 2022-08-21 DIAGNOSIS — F419 Anxiety disorder, unspecified: Secondary | ICD-10-CM | POA: Diagnosis not present

## 2022-08-22 DIAGNOSIS — I4891 Unspecified atrial fibrillation: Secondary | ICD-10-CM | POA: Diagnosis not present

## 2022-08-22 DIAGNOSIS — L97819 Non-pressure chronic ulcer of other part of right lower leg with unspecified severity: Secondary | ICD-10-CM | POA: Diagnosis not present

## 2022-08-22 DIAGNOSIS — R634 Abnormal weight loss: Secondary | ICD-10-CM | POA: Diagnosis not present

## 2022-08-22 DIAGNOSIS — I872 Venous insufficiency (chronic) (peripheral): Secondary | ICD-10-CM | POA: Diagnosis not present

## 2022-08-22 DIAGNOSIS — I442 Atrioventricular block, complete: Secondary | ICD-10-CM | POA: Diagnosis not present

## 2022-08-22 DIAGNOSIS — I739 Peripheral vascular disease, unspecified: Secondary | ICD-10-CM | POA: Diagnosis not present

## 2022-08-24 DIAGNOSIS — I739 Peripheral vascular disease, unspecified: Secondary | ICD-10-CM | POA: Diagnosis not present

## 2022-08-24 DIAGNOSIS — R053 Chronic cough: Secondary | ICD-10-CM | POA: Diagnosis not present

## 2022-08-24 DIAGNOSIS — R634 Abnormal weight loss: Secondary | ICD-10-CM | POA: Diagnosis not present

## 2022-08-24 DIAGNOSIS — I442 Atrioventricular block, complete: Secondary | ICD-10-CM | POA: Diagnosis not present

## 2022-08-24 DIAGNOSIS — R140 Abdominal distension (gaseous): Secondary | ICD-10-CM | POA: Diagnosis not present

## 2022-08-24 DIAGNOSIS — K5900 Constipation, unspecified: Secondary | ICD-10-CM | POA: Diagnosis not present

## 2022-08-24 DIAGNOSIS — L97819 Non-pressure chronic ulcer of other part of right lower leg with unspecified severity: Secondary | ICD-10-CM | POA: Diagnosis not present

## 2022-08-24 DIAGNOSIS — I4891 Unspecified atrial fibrillation: Secondary | ICD-10-CM | POA: Diagnosis not present

## 2022-08-24 DIAGNOSIS — I872 Venous insufficiency (chronic) (peripheral): Secondary | ICD-10-CM | POA: Diagnosis not present

## 2022-08-25 DIAGNOSIS — R634 Abnormal weight loss: Secondary | ICD-10-CM | POA: Diagnosis not present

## 2022-08-25 DIAGNOSIS — I442 Atrioventricular block, complete: Secondary | ICD-10-CM | POA: Diagnosis not present

## 2022-08-25 DIAGNOSIS — L97819 Non-pressure chronic ulcer of other part of right lower leg with unspecified severity: Secondary | ICD-10-CM | POA: Diagnosis not present

## 2022-08-25 DIAGNOSIS — I872 Venous insufficiency (chronic) (peripheral): Secondary | ICD-10-CM | POA: Diagnosis not present

## 2022-08-25 DIAGNOSIS — I739 Peripheral vascular disease, unspecified: Secondary | ICD-10-CM | POA: Diagnosis not present

## 2022-08-25 DIAGNOSIS — I4891 Unspecified atrial fibrillation: Secondary | ICD-10-CM | POA: Diagnosis not present

## 2022-08-26 DIAGNOSIS — I442 Atrioventricular block, complete: Secondary | ICD-10-CM | POA: Diagnosis not present

## 2022-08-26 DIAGNOSIS — L97819 Non-pressure chronic ulcer of other part of right lower leg with unspecified severity: Secondary | ICD-10-CM | POA: Diagnosis not present

## 2022-08-26 DIAGNOSIS — I872 Venous insufficiency (chronic) (peripheral): Secondary | ICD-10-CM | POA: Diagnosis not present

## 2022-08-26 DIAGNOSIS — I4891 Unspecified atrial fibrillation: Secondary | ICD-10-CM | POA: Diagnosis not present

## 2022-08-26 DIAGNOSIS — R634 Abnormal weight loss: Secondary | ICD-10-CM | POA: Diagnosis not present

## 2022-08-26 DIAGNOSIS — I739 Peripheral vascular disease, unspecified: Secondary | ICD-10-CM | POA: Diagnosis not present

## 2022-08-27 DIAGNOSIS — I442 Atrioventricular block, complete: Secondary | ICD-10-CM | POA: Diagnosis not present

## 2022-08-27 DIAGNOSIS — I872 Venous insufficiency (chronic) (peripheral): Secondary | ICD-10-CM | POA: Diagnosis not present

## 2022-08-27 DIAGNOSIS — I739 Peripheral vascular disease, unspecified: Secondary | ICD-10-CM | POA: Diagnosis not present

## 2022-08-27 DIAGNOSIS — I4891 Unspecified atrial fibrillation: Secondary | ICD-10-CM | POA: Diagnosis not present

## 2022-08-27 DIAGNOSIS — R634 Abnormal weight loss: Secondary | ICD-10-CM | POA: Diagnosis not present

## 2022-08-27 DIAGNOSIS — L97819 Non-pressure chronic ulcer of other part of right lower leg with unspecified severity: Secondary | ICD-10-CM | POA: Diagnosis not present

## 2022-08-28 DIAGNOSIS — I739 Peripheral vascular disease, unspecified: Secondary | ICD-10-CM | POA: Diagnosis not present

## 2022-08-28 DIAGNOSIS — L97819 Non-pressure chronic ulcer of other part of right lower leg with unspecified severity: Secondary | ICD-10-CM | POA: Diagnosis not present

## 2022-08-28 DIAGNOSIS — I442 Atrioventricular block, complete: Secondary | ICD-10-CM | POA: Diagnosis not present

## 2022-08-28 DIAGNOSIS — I872 Venous insufficiency (chronic) (peripheral): Secondary | ICD-10-CM | POA: Diagnosis not present

## 2022-08-28 DIAGNOSIS — I4891 Unspecified atrial fibrillation: Secondary | ICD-10-CM | POA: Diagnosis not present

## 2022-08-28 DIAGNOSIS — R634 Abnormal weight loss: Secondary | ICD-10-CM | POA: Diagnosis not present

## 2022-08-29 DIAGNOSIS — B351 Tinea unguium: Secondary | ICD-10-CM | POA: Diagnosis not present

## 2022-08-29 DIAGNOSIS — I7091 Generalized atherosclerosis: Secondary | ICD-10-CM | POA: Diagnosis not present

## 2022-08-31 DIAGNOSIS — F02C0 Dementia in other diseases classified elsewhere, severe, without behavioral disturbance, psychotic disturbance, mood disturbance, and anxiety: Secondary | ICD-10-CM | POA: Diagnosis not present

## 2022-08-31 DIAGNOSIS — G309 Alzheimer's disease, unspecified: Secondary | ICD-10-CM | POA: Diagnosis not present

## 2022-08-31 DIAGNOSIS — Z008 Encounter for other general examination: Secondary | ICD-10-CM | POA: Diagnosis not present

## 2022-09-01 DIAGNOSIS — M6281 Muscle weakness (generalized): Secondary | ICD-10-CM | POA: Diagnosis not present

## 2022-09-01 DIAGNOSIS — I872 Venous insufficiency (chronic) (peripheral): Secondary | ICD-10-CM | POA: Diagnosis not present

## 2022-09-01 DIAGNOSIS — I4891 Unspecified atrial fibrillation: Secondary | ICD-10-CM | POA: Diagnosis not present

## 2022-09-01 DIAGNOSIS — E038 Other specified hypothyroidism: Secondary | ICD-10-CM | POA: Diagnosis not present

## 2022-09-01 DIAGNOSIS — L97819 Non-pressure chronic ulcer of other part of right lower leg with unspecified severity: Secondary | ICD-10-CM | POA: Diagnosis not present

## 2022-09-01 DIAGNOSIS — F02C0 Dementia in other diseases classified elsewhere, severe, without behavioral disturbance, psychotic disturbance, mood disturbance, and anxiety: Secondary | ICD-10-CM | POA: Diagnosis not present

## 2022-09-01 DIAGNOSIS — I480 Paroxysmal atrial fibrillation: Secondary | ICD-10-CM | POA: Diagnosis not present

## 2022-09-01 DIAGNOSIS — I442 Atrioventricular block, complete: Secondary | ICD-10-CM | POA: Diagnosis not present

## 2022-09-01 DIAGNOSIS — E7800 Pure hypercholesterolemia, unspecified: Secondary | ICD-10-CM | POA: Diagnosis not present

## 2022-09-01 DIAGNOSIS — I739 Peripheral vascular disease, unspecified: Secondary | ICD-10-CM | POA: Diagnosis not present

## 2022-09-01 DIAGNOSIS — R634 Abnormal weight loss: Secondary | ICD-10-CM | POA: Diagnosis not present

## 2022-09-04 DIAGNOSIS — I739 Peripheral vascular disease, unspecified: Secondary | ICD-10-CM | POA: Diagnosis not present

## 2022-09-04 DIAGNOSIS — I4891 Unspecified atrial fibrillation: Secondary | ICD-10-CM | POA: Diagnosis not present

## 2022-09-04 DIAGNOSIS — I442 Atrioventricular block, complete: Secondary | ICD-10-CM | POA: Diagnosis not present

## 2022-09-04 DIAGNOSIS — I872 Venous insufficiency (chronic) (peripheral): Secondary | ICD-10-CM | POA: Diagnosis not present

## 2022-09-04 DIAGNOSIS — L97819 Non-pressure chronic ulcer of other part of right lower leg with unspecified severity: Secondary | ICD-10-CM | POA: Diagnosis not present

## 2022-09-04 DIAGNOSIS — R634 Abnormal weight loss: Secondary | ICD-10-CM | POA: Diagnosis not present

## 2022-09-06 NOTE — Progress Notes (Signed)
Remote pacemaker transmission.   

## 2022-09-08 DIAGNOSIS — I872 Venous insufficiency (chronic) (peripheral): Secondary | ICD-10-CM | POA: Diagnosis not present

## 2022-09-08 DIAGNOSIS — I4891 Unspecified atrial fibrillation: Secondary | ICD-10-CM | POA: Diagnosis not present

## 2022-09-08 DIAGNOSIS — R634 Abnormal weight loss: Secondary | ICD-10-CM | POA: Diagnosis not present

## 2022-09-08 DIAGNOSIS — I739 Peripheral vascular disease, unspecified: Secondary | ICD-10-CM | POA: Diagnosis not present

## 2022-09-08 DIAGNOSIS — L97819 Non-pressure chronic ulcer of other part of right lower leg with unspecified severity: Secondary | ICD-10-CM | POA: Diagnosis not present

## 2022-09-08 DIAGNOSIS — I442 Atrioventricular block, complete: Secondary | ICD-10-CM | POA: Diagnosis not present

## 2022-09-10 DIAGNOSIS — I739 Peripheral vascular disease, unspecified: Secondary | ICD-10-CM | POA: Diagnosis not present

## 2022-09-10 DIAGNOSIS — I442 Atrioventricular block, complete: Secondary | ICD-10-CM | POA: Diagnosis not present

## 2022-09-10 DIAGNOSIS — L97819 Non-pressure chronic ulcer of other part of right lower leg with unspecified severity: Secondary | ICD-10-CM | POA: Diagnosis not present

## 2022-09-10 DIAGNOSIS — I872 Venous insufficiency (chronic) (peripheral): Secondary | ICD-10-CM | POA: Diagnosis not present

## 2022-09-10 DIAGNOSIS — R634 Abnormal weight loss: Secondary | ICD-10-CM | POA: Diagnosis not present

## 2022-09-10 DIAGNOSIS — I4891 Unspecified atrial fibrillation: Secondary | ICD-10-CM | POA: Diagnosis not present

## 2022-09-11 DIAGNOSIS — G309 Alzheimer's disease, unspecified: Secondary | ICD-10-CM | POA: Diagnosis not present

## 2022-09-11 DIAGNOSIS — K5900 Constipation, unspecified: Secondary | ICD-10-CM | POA: Diagnosis not present

## 2022-09-11 DIAGNOSIS — F02C0 Dementia in other diseases classified elsewhere, severe, without behavioral disturbance, psychotic disturbance, mood disturbance, and anxiety: Secondary | ICD-10-CM | POA: Diagnosis not present

## 2022-09-11 DIAGNOSIS — E640 Sequelae of protein-calorie malnutrition: Secondary | ICD-10-CM | POA: Diagnosis not present

## 2022-09-11 DIAGNOSIS — J189 Pneumonia, unspecified organism: Secondary | ICD-10-CM | POA: Diagnosis not present

## 2022-09-11 DIAGNOSIS — I872 Venous insufficiency (chronic) (peripheral): Secondary | ICD-10-CM | POA: Diagnosis not present

## 2022-09-11 DIAGNOSIS — Z681 Body mass index (BMI) 19 or less, adult: Secondary | ICD-10-CM | POA: Diagnosis not present

## 2022-09-11 DIAGNOSIS — R634 Abnormal weight loss: Secondary | ICD-10-CM | POA: Diagnosis not present

## 2022-09-11 DIAGNOSIS — R269 Unspecified abnormalities of gait and mobility: Secondary | ICD-10-CM | POA: Diagnosis not present

## 2022-09-11 DIAGNOSIS — I739 Peripheral vascular disease, unspecified: Secondary | ICD-10-CM | POA: Diagnosis not present

## 2022-09-11 DIAGNOSIS — B370 Candidal stomatitis: Secondary | ICD-10-CM | POA: Diagnosis not present

## 2022-09-11 DIAGNOSIS — M6281 Muscle weakness (generalized): Secondary | ICD-10-CM | POA: Diagnosis not present

## 2022-09-11 DIAGNOSIS — I442 Atrioventricular block, complete: Secondary | ICD-10-CM | POA: Diagnosis not present

## 2022-09-11 DIAGNOSIS — L97819 Non-pressure chronic ulcer of other part of right lower leg with unspecified severity: Secondary | ICD-10-CM | POA: Diagnosis not present

## 2022-09-11 DIAGNOSIS — I4891 Unspecified atrial fibrillation: Secondary | ICD-10-CM | POA: Diagnosis not present

## 2022-09-13 DIAGNOSIS — G309 Alzheimer's disease, unspecified: Secondary | ICD-10-CM | POA: Diagnosis not present

## 2022-09-15 DIAGNOSIS — I872 Venous insufficiency (chronic) (peripheral): Secondary | ICD-10-CM | POA: Diagnosis not present

## 2022-09-15 DIAGNOSIS — I4891 Unspecified atrial fibrillation: Secondary | ICD-10-CM | POA: Diagnosis not present

## 2022-09-15 DIAGNOSIS — I442 Atrioventricular block, complete: Secondary | ICD-10-CM | POA: Diagnosis not present

## 2022-09-15 DIAGNOSIS — L97819 Non-pressure chronic ulcer of other part of right lower leg with unspecified severity: Secondary | ICD-10-CM | POA: Diagnosis not present

## 2022-09-15 DIAGNOSIS — I739 Peripheral vascular disease, unspecified: Secondary | ICD-10-CM | POA: Diagnosis not present

## 2022-09-15 DIAGNOSIS — R634 Abnormal weight loss: Secondary | ICD-10-CM | POA: Diagnosis not present

## 2022-09-16 DIAGNOSIS — L97819 Non-pressure chronic ulcer of other part of right lower leg with unspecified severity: Secondary | ICD-10-CM | POA: Diagnosis not present

## 2022-09-16 DIAGNOSIS — R634 Abnormal weight loss: Secondary | ICD-10-CM | POA: Diagnosis not present

## 2022-09-16 DIAGNOSIS — I442 Atrioventricular block, complete: Secondary | ICD-10-CM | POA: Diagnosis not present

## 2022-09-16 DIAGNOSIS — I739 Peripheral vascular disease, unspecified: Secondary | ICD-10-CM | POA: Diagnosis not present

## 2022-09-16 DIAGNOSIS — I872 Venous insufficiency (chronic) (peripheral): Secondary | ICD-10-CM | POA: Diagnosis not present

## 2022-09-16 DIAGNOSIS — I4891 Unspecified atrial fibrillation: Secondary | ICD-10-CM | POA: Diagnosis not present

## 2022-09-17 DIAGNOSIS — I739 Peripheral vascular disease, unspecified: Secondary | ICD-10-CM | POA: Diagnosis not present

## 2022-09-17 DIAGNOSIS — I442 Atrioventricular block, complete: Secondary | ICD-10-CM | POA: Diagnosis not present

## 2022-09-17 DIAGNOSIS — I4891 Unspecified atrial fibrillation: Secondary | ICD-10-CM | POA: Diagnosis not present

## 2022-09-17 DIAGNOSIS — I872 Venous insufficiency (chronic) (peripheral): Secondary | ICD-10-CM | POA: Diagnosis not present

## 2022-09-17 DIAGNOSIS — L97819 Non-pressure chronic ulcer of other part of right lower leg with unspecified severity: Secondary | ICD-10-CM | POA: Diagnosis not present

## 2022-09-17 DIAGNOSIS — R634 Abnormal weight loss: Secondary | ICD-10-CM | POA: Diagnosis not present

## 2022-09-18 DIAGNOSIS — L97819 Non-pressure chronic ulcer of other part of right lower leg with unspecified severity: Secondary | ICD-10-CM | POA: Diagnosis not present

## 2022-09-18 DIAGNOSIS — I4891 Unspecified atrial fibrillation: Secondary | ICD-10-CM | POA: Diagnosis not present

## 2022-09-18 DIAGNOSIS — I442 Atrioventricular block, complete: Secondary | ICD-10-CM | POA: Diagnosis not present

## 2022-09-18 DIAGNOSIS — R634 Abnormal weight loss: Secondary | ICD-10-CM | POA: Diagnosis not present

## 2022-09-18 DIAGNOSIS — I872 Venous insufficiency (chronic) (peripheral): Secondary | ICD-10-CM | POA: Diagnosis not present

## 2022-09-18 DIAGNOSIS — I739 Peripheral vascular disease, unspecified: Secondary | ICD-10-CM | POA: Diagnosis not present

## 2022-09-19 DIAGNOSIS — I4891 Unspecified atrial fibrillation: Secondary | ICD-10-CM | POA: Diagnosis not present

## 2022-09-19 DIAGNOSIS — R634 Abnormal weight loss: Secondary | ICD-10-CM | POA: Diagnosis not present

## 2022-09-19 DIAGNOSIS — I442 Atrioventricular block, complete: Secondary | ICD-10-CM | POA: Diagnosis not present

## 2022-09-19 DIAGNOSIS — I739 Peripheral vascular disease, unspecified: Secondary | ICD-10-CM | POA: Diagnosis not present

## 2022-09-19 DIAGNOSIS — I872 Venous insufficiency (chronic) (peripheral): Secondary | ICD-10-CM | POA: Diagnosis not present

## 2022-09-19 DIAGNOSIS — L97819 Non-pressure chronic ulcer of other part of right lower leg with unspecified severity: Secondary | ICD-10-CM | POA: Diagnosis not present

## 2022-09-20 DIAGNOSIS — I739 Peripheral vascular disease, unspecified: Secondary | ICD-10-CM | POA: Diagnosis not present

## 2022-09-20 DIAGNOSIS — Z86711 Personal history of pulmonary embolism: Secondary | ICD-10-CM | POA: Diagnosis not present

## 2022-09-20 DIAGNOSIS — Z8572 Personal history of non-Hodgkin lymphomas: Secondary | ICD-10-CM | POA: Diagnosis not present

## 2022-09-20 DIAGNOSIS — R627 Adult failure to thrive: Secondary | ICD-10-CM | POA: Diagnosis not present

## 2022-09-20 DIAGNOSIS — I4891 Unspecified atrial fibrillation: Secondary | ICD-10-CM | POA: Diagnosis not present

## 2022-09-20 DIAGNOSIS — I872 Venous insufficiency (chronic) (peripheral): Secondary | ICD-10-CM | POA: Diagnosis not present

## 2022-09-20 DIAGNOSIS — R634 Abnormal weight loss: Secondary | ICD-10-CM | POA: Diagnosis not present

## 2022-09-20 DIAGNOSIS — E785 Hyperlipidemia, unspecified: Secondary | ICD-10-CM | POA: Diagnosis not present

## 2022-09-20 DIAGNOSIS — I442 Atrioventricular block, complete: Secondary | ICD-10-CM | POA: Diagnosis not present

## 2022-09-20 DIAGNOSIS — E559 Vitamin D deficiency, unspecified: Secondary | ICD-10-CM | POA: Diagnosis not present

## 2022-09-20 DIAGNOSIS — F419 Anxiety disorder, unspecified: Secondary | ICD-10-CM | POA: Diagnosis not present

## 2022-09-20 DIAGNOSIS — Z8673 Personal history of transient ischemic attack (TIA), and cerebral infarction without residual deficits: Secondary | ICD-10-CM | POA: Diagnosis not present

## 2022-09-20 DIAGNOSIS — I509 Heart failure, unspecified: Secondary | ICD-10-CM | POA: Diagnosis not present

## 2022-09-20 DIAGNOSIS — I82622 Acute embolism and thrombosis of deep veins of left upper extremity: Secondary | ICD-10-CM | POA: Diagnosis not present

## 2022-09-20 DIAGNOSIS — Z9081 Acquired absence of spleen: Secondary | ICD-10-CM | POA: Diagnosis not present

## 2022-09-20 DIAGNOSIS — Z7401 Bed confinement status: Secondary | ICD-10-CM | POA: Diagnosis not present

## 2022-09-20 DIAGNOSIS — R296 Repeated falls: Secondary | ICD-10-CM | POA: Diagnosis not present

## 2022-09-20 DIAGNOSIS — L97819 Non-pressure chronic ulcer of other part of right lower leg with unspecified severity: Secondary | ICD-10-CM | POA: Diagnosis not present

## 2022-09-20 DIAGNOSIS — E039 Hypothyroidism, unspecified: Secondary | ICD-10-CM | POA: Diagnosis not present

## 2022-09-20 DIAGNOSIS — F32A Depression, unspecified: Secondary | ICD-10-CM | POA: Diagnosis not present

## 2022-09-20 DIAGNOSIS — I110 Hypertensive heart disease with heart failure: Secondary | ICD-10-CM | POA: Diagnosis not present

## 2022-09-20 DIAGNOSIS — Z950 Presence of cardiac pacemaker: Secondary | ICD-10-CM | POA: Diagnosis not present

## 2022-09-20 DIAGNOSIS — L97829 Non-pressure chronic ulcer of other part of left lower leg with unspecified severity: Secondary | ICD-10-CM | POA: Diagnosis not present

## 2022-09-21 DIAGNOSIS — L97819 Non-pressure chronic ulcer of other part of right lower leg with unspecified severity: Secondary | ICD-10-CM | POA: Diagnosis not present

## 2022-09-21 DIAGNOSIS — I6930 Unspecified sequelae of cerebral infarction: Secondary | ICD-10-CM | POA: Diagnosis not present

## 2022-09-21 DIAGNOSIS — E7800 Pure hypercholesterolemia, unspecified: Secondary | ICD-10-CM | POA: Diagnosis not present

## 2022-09-21 DIAGNOSIS — Z515 Encounter for palliative care: Secondary | ICD-10-CM | POA: Diagnosis not present

## 2022-09-21 DIAGNOSIS — R634 Abnormal weight loss: Secondary | ICD-10-CM | POA: Diagnosis not present

## 2022-09-21 DIAGNOSIS — I4891 Unspecified atrial fibrillation: Secondary | ICD-10-CM | POA: Diagnosis not present

## 2022-09-21 DIAGNOSIS — I442 Atrioventricular block, complete: Secondary | ICD-10-CM | POA: Diagnosis not present

## 2022-09-21 DIAGNOSIS — I739 Peripheral vascular disease, unspecified: Secondary | ICD-10-CM | POA: Diagnosis not present

## 2022-09-21 DIAGNOSIS — I872 Venous insufficiency (chronic) (peripheral): Secondary | ICD-10-CM | POA: Diagnosis not present

## 2022-09-21 DIAGNOSIS — Z950 Presence of cardiac pacemaker: Secondary | ICD-10-CM | POA: Diagnosis not present

## 2022-09-21 DIAGNOSIS — E038 Other specified hypothyroidism: Secondary | ICD-10-CM | POA: Diagnosis not present

## 2022-10-03 ENCOUNTER — Ambulatory Visit: Payer: Medicare Other | Admitting: Podiatry

## 2022-10-10 ENCOUNTER — Ambulatory Visit: Payer: Medicare Other | Admitting: Hematology

## 2022-10-10 ENCOUNTER — Other Ambulatory Visit: Payer: Medicare Other

## 2022-10-21 DEATH — deceased
